# Patient Record
Sex: Female | Born: 1960
Health system: Southern US, Community
[De-identification: ages and names within clinical notes are randomized; demographics above are authoritative.]

## PROBLEM LIST (undated history)

## (undated) DIAGNOSIS — T7840XA Allergy, unspecified, initial encounter: Secondary | ICD-10-CM

## (undated) DIAGNOSIS — Z91199 Patient's noncompliance with other medical treatment and regimen due to unspecified reason: Secondary | ICD-10-CM

## (undated) DIAGNOSIS — G4730 Sleep apnea, unspecified: Secondary | ICD-10-CM

## (undated) DIAGNOSIS — E119 Type 2 diabetes mellitus without complications: Secondary | ICD-10-CM

## (undated) DIAGNOSIS — N83209 Unspecified ovarian cyst, unspecified side: Secondary | ICD-10-CM

## (undated) DIAGNOSIS — E079 Disorder of thyroid, unspecified: Secondary | ICD-10-CM

## (undated) DIAGNOSIS — K222 Esophageal obstruction: Secondary | ICD-10-CM

## (undated) DIAGNOSIS — R0789 Other chest pain: Secondary | ICD-10-CM

## (undated) DIAGNOSIS — K760 Fatty (change of) liver, not elsewhere classified: Secondary | ICD-10-CM

## (undated) DIAGNOSIS — R011 Cardiac murmur, unspecified: Secondary | ICD-10-CM

## (undated) DIAGNOSIS — E785 Hyperlipidemia, unspecified: Secondary | ICD-10-CM

## (undated) DIAGNOSIS — K279 Peptic ulcer, site unspecified, unspecified as acute or chronic, without hemorrhage or perforation: Secondary | ICD-10-CM

## (undated) DIAGNOSIS — R748 Abnormal levels of other serum enzymes: Secondary | ICD-10-CM

## (undated) DIAGNOSIS — I10 Essential (primary) hypertension: Secondary | ICD-10-CM

## (undated) DIAGNOSIS — D1803 Hemangioma of intra-abdominal structures: Secondary | ICD-10-CM

## (undated) DIAGNOSIS — K219 Gastro-esophageal reflux disease without esophagitis: Secondary | ICD-10-CM

## (undated) DIAGNOSIS — M1990 Unspecified osteoarthritis, unspecified site: Secondary | ICD-10-CM

## (undated) DIAGNOSIS — L919 Hypertrophic disorder of the skin, unspecified: Secondary | ICD-10-CM

## (undated) DIAGNOSIS — K3184 Gastroparesis: Secondary | ICD-10-CM

## (undated) DIAGNOSIS — D509 Iron deficiency anemia, unspecified: Secondary | ICD-10-CM

## (undated) DIAGNOSIS — Z9119 Patient's noncompliance with other medical treatment and regimen: Secondary | ICD-10-CM

## (undated) DIAGNOSIS — G43909 Migraine, unspecified, not intractable, without status migrainosus: Secondary | ICD-10-CM

## (undated) HISTORY — DX: Iron deficiency anemia, unspecified: D50.9

## (undated) HISTORY — DX: Esophageal obstruction: K22.2

## (undated) HISTORY — DX: Unspecified ovarian cyst, unspecified side: N83.209

## (undated) HISTORY — DX: Sleep apnea, unspecified: G47.30

## (undated) HISTORY — PX: COLONOSCOPY: SHX174

## (undated) HISTORY — DX: Type 2 diabetes mellitus without complications: E11.9

## (undated) HISTORY — DX: Gastro-esophageal reflux disease without esophagitis: K21.9

## (undated) HISTORY — DX: Patient's noncompliance with other medical treatment and regimen due to unspecified reason: Z91.199

## (undated) HISTORY — PX: TOTAL ABDOMINAL HYSTERECTOMY: SHX209

## (undated) HISTORY — DX: Allergy, unspecified, initial encounter: T78.40XA

## (undated) HISTORY — DX: Hyperlipidemia, unspecified: E78.5

## (undated) HISTORY — DX: Other chest pain: R07.89

## (undated) HISTORY — DX: Disorder of thyroid, unspecified: E07.9

## (undated) HISTORY — PX: UPPER GASTROINTESTINAL ENDOSCOPY: SHX188

## (undated) HISTORY — DX: Cardiac murmur, unspecified: R01.1

## (undated) HISTORY — DX: Migraine, unspecified, not intractable, without status migrainosus: G43.909

## (undated) HISTORY — PX: BREAST BIOPSY: SHX20

## (undated) HISTORY — DX: Hemangioma of intra-abdominal structures: D18.03

## (undated) HISTORY — DX: Patient's noncompliance with other medical treatment and regimen: Z91.19

## (undated) HISTORY — PX: CHOLECYSTECTOMY: SHX55

## (undated) HISTORY — DX: Peptic ulcer, site unspecified, unspecified as acute or chronic, without hemorrhage or perforation: K27.9

## (undated) HISTORY — DX: Hypertrophic disorder of the skin, unspecified: L91.9

## (undated) HISTORY — DX: Unspecified osteoarthritis, unspecified site: M19.90

## (undated) HISTORY — PX: POLYPECTOMY: SHX149

## (undated) HISTORY — DX: Abnormal levels of other serum enzymes: R74.8

## (undated) HISTORY — DX: Essential (primary) hypertension: I10

## (undated) HISTORY — DX: Gastroparesis: K31.84

## (undated) HISTORY — DX: Fatty (change of) liver, not elsewhere classified: K76.0

## (undated) HISTORY — PX: ABDOMINAL EXPLORATION SURGERY: SHX538

---

## 1998-03-14 ENCOUNTER — Other Ambulatory Visit: Admission: RE | Admit: 1998-03-14 | Discharge: 1998-03-14 | Payer: Self-pay | Admitting: Obstetrics and Gynecology

## 1998-05-30 ENCOUNTER — Emergency Department (HOSPITAL_COMMUNITY): Admission: EM | Admit: 1998-05-30 | Discharge: 1998-05-30 | Payer: Self-pay | Admitting: Emergency Medicine

## 1998-08-03 ENCOUNTER — Emergency Department (HOSPITAL_COMMUNITY): Admission: EM | Admit: 1998-08-03 | Discharge: 1998-08-03 | Payer: Self-pay | Admitting: Emergency Medicine

## 1998-08-11 ENCOUNTER — Encounter: Payer: Self-pay | Admitting: Internal Medicine

## 1998-08-11 ENCOUNTER — Emergency Department (HOSPITAL_COMMUNITY): Admission: EM | Admit: 1998-08-11 | Discharge: 1998-08-11 | Payer: Self-pay | Admitting: Internal Medicine

## 1998-08-24 ENCOUNTER — Emergency Department (HOSPITAL_COMMUNITY): Admission: EM | Admit: 1998-08-24 | Discharge: 1998-08-24 | Payer: Self-pay | Admitting: Emergency Medicine

## 1998-10-26 ENCOUNTER — Emergency Department (HOSPITAL_COMMUNITY): Admission: EM | Admit: 1998-10-26 | Discharge: 1998-10-26 | Payer: Self-pay

## 1998-12-23 ENCOUNTER — Emergency Department (HOSPITAL_COMMUNITY): Admission: EM | Admit: 1998-12-23 | Discharge: 1998-12-23 | Payer: Self-pay | Admitting: Emergency Medicine

## 1998-12-30 ENCOUNTER — Ambulatory Visit (HOSPITAL_COMMUNITY): Admission: RE | Admit: 1998-12-30 | Discharge: 1998-12-30 | Payer: Self-pay | Admitting: Emergency Medicine

## 1998-12-30 ENCOUNTER — Encounter: Payer: Self-pay | Admitting: Emergency Medicine

## 1999-03-08 ENCOUNTER — Emergency Department (HOSPITAL_COMMUNITY): Admission: EM | Admit: 1999-03-08 | Discharge: 1999-03-08 | Payer: Self-pay | Admitting: Emergency Medicine

## 1999-03-10 ENCOUNTER — Encounter (HOSPITAL_COMMUNITY): Admission: RE | Admit: 1999-03-10 | Discharge: 1999-06-08 | Payer: Self-pay | Admitting: Neurology

## 1999-04-26 ENCOUNTER — Emergency Department (HOSPITAL_COMMUNITY): Admission: EM | Admit: 1999-04-26 | Discharge: 1999-04-26 | Payer: Self-pay | Admitting: Internal Medicine

## 1999-04-26 ENCOUNTER — Encounter: Payer: Self-pay | Admitting: Internal Medicine

## 1999-05-15 ENCOUNTER — Encounter: Admission: RE | Admit: 1999-05-15 | Discharge: 1999-08-13 | Payer: Self-pay | Admitting: *Deleted

## 1999-06-02 ENCOUNTER — Ambulatory Visit (HOSPITAL_COMMUNITY): Admission: RE | Admit: 1999-06-02 | Discharge: 1999-06-02 | Payer: Self-pay | Admitting: Obstetrics and Gynecology

## 1999-07-05 ENCOUNTER — Emergency Department (HOSPITAL_COMMUNITY): Admission: EM | Admit: 1999-07-05 | Discharge: 1999-07-05 | Payer: Self-pay | Admitting: *Deleted

## 1999-07-06 ENCOUNTER — Emergency Department (HOSPITAL_COMMUNITY): Admission: EM | Admit: 1999-07-06 | Discharge: 1999-07-06 | Payer: Self-pay | Admitting: Emergency Medicine

## 1999-07-11 ENCOUNTER — Encounter: Payer: Self-pay | Admitting: *Deleted

## 1999-07-11 ENCOUNTER — Encounter: Admission: RE | Admit: 1999-07-11 | Discharge: 1999-07-11 | Payer: Self-pay | Admitting: *Deleted

## 1999-07-13 ENCOUNTER — Ambulatory Visit (HOSPITAL_COMMUNITY): Admission: RE | Admit: 1999-07-13 | Discharge: 1999-07-13 | Payer: Self-pay | Admitting: Obstetrics and Gynecology

## 1999-09-27 ENCOUNTER — Emergency Department (HOSPITAL_COMMUNITY): Admission: EM | Admit: 1999-09-27 | Discharge: 1999-09-27 | Payer: Self-pay | Admitting: Internal Medicine

## 1999-09-27 ENCOUNTER — Encounter: Payer: Self-pay | Admitting: Emergency Medicine

## 1999-11-08 ENCOUNTER — Emergency Department (HOSPITAL_COMMUNITY): Admission: EM | Admit: 1999-11-08 | Discharge: 1999-11-08 | Payer: Self-pay | Admitting: Emergency Medicine

## 1999-11-14 ENCOUNTER — Emergency Department (HOSPITAL_COMMUNITY): Admission: EM | Admit: 1999-11-14 | Discharge: 1999-11-14 | Payer: Self-pay | Admitting: *Deleted

## 2000-01-09 ENCOUNTER — Encounter: Payer: Self-pay | Admitting: Emergency Medicine

## 2000-01-09 ENCOUNTER — Emergency Department (HOSPITAL_COMMUNITY): Admission: EM | Admit: 2000-01-09 | Discharge: 2000-01-09 | Payer: Self-pay | Admitting: Emergency Medicine

## 2000-02-21 ENCOUNTER — Emergency Department (HOSPITAL_COMMUNITY): Admission: EM | Admit: 2000-02-21 | Discharge: 2000-02-21 | Payer: Self-pay | Admitting: Emergency Medicine

## 2000-02-29 ENCOUNTER — Emergency Department (HOSPITAL_COMMUNITY): Admission: EM | Admit: 2000-02-29 | Discharge: 2000-02-29 | Payer: Self-pay | Admitting: *Deleted

## 2000-03-05 ENCOUNTER — Other Ambulatory Visit: Admission: RE | Admit: 2000-03-05 | Discharge: 2000-03-05 | Payer: Self-pay | Admitting: Orthopedic Surgery

## 2000-04-10 ENCOUNTER — Emergency Department (HOSPITAL_COMMUNITY): Admission: EM | Admit: 2000-04-10 | Discharge: 2000-04-10 | Payer: Self-pay | Admitting: Emergency Medicine

## 2000-04-18 ENCOUNTER — Emergency Department (HOSPITAL_COMMUNITY): Admission: EM | Admit: 2000-04-18 | Discharge: 2000-04-19 | Payer: Self-pay

## 2000-04-19 ENCOUNTER — Encounter: Payer: Self-pay | Admitting: Emergency Medicine

## 2000-07-16 ENCOUNTER — Other Ambulatory Visit: Admission: RE | Admit: 2000-07-16 | Discharge: 2000-07-16 | Payer: Self-pay | Admitting: Orthopedic Surgery

## 2000-09-12 ENCOUNTER — Emergency Department (HOSPITAL_COMMUNITY): Admission: EM | Admit: 2000-09-12 | Discharge: 2000-09-12 | Payer: Self-pay | Admitting: Emergency Medicine

## 2000-12-12 ENCOUNTER — Encounter: Payer: Self-pay | Admitting: Emergency Medicine

## 2000-12-12 ENCOUNTER — Emergency Department (HOSPITAL_COMMUNITY): Admission: EM | Admit: 2000-12-12 | Discharge: 2000-12-12 | Payer: Self-pay | Admitting: Emergency Medicine

## 2001-01-09 ENCOUNTER — Other Ambulatory Visit: Admission: RE | Admit: 2001-01-09 | Discharge: 2001-01-09 | Payer: Self-pay | Admitting: Orthopedic Surgery

## 2001-03-19 ENCOUNTER — Emergency Department (HOSPITAL_COMMUNITY): Admission: EM | Admit: 2001-03-19 | Discharge: 2001-03-20 | Payer: Self-pay | Admitting: Emergency Medicine

## 2001-03-20 ENCOUNTER — Encounter: Payer: Self-pay | Admitting: Emergency Medicine

## 2001-04-22 ENCOUNTER — Emergency Department (HOSPITAL_COMMUNITY): Admission: EM | Admit: 2001-04-22 | Discharge: 2001-04-22 | Payer: Self-pay | Admitting: *Deleted

## 2001-05-02 ENCOUNTER — Emergency Department (HOSPITAL_COMMUNITY): Admission: EM | Admit: 2001-05-02 | Discharge: 2001-05-02 | Payer: Self-pay | Admitting: Emergency Medicine

## 2001-05-13 ENCOUNTER — Emergency Department (HOSPITAL_COMMUNITY): Admission: EM | Admit: 2001-05-13 | Discharge: 2001-05-13 | Payer: Self-pay | Admitting: Emergency Medicine

## 2001-06-30 ENCOUNTER — Emergency Department (HOSPITAL_COMMUNITY): Admission: EM | Admit: 2001-06-30 | Discharge: 2001-06-30 | Payer: Self-pay | Admitting: Emergency Medicine

## 2001-08-31 ENCOUNTER — Emergency Department (HOSPITAL_COMMUNITY): Admission: EM | Admit: 2001-08-31 | Discharge: 2001-09-01 | Payer: Self-pay | Admitting: Emergency Medicine

## 2001-09-05 ENCOUNTER — Emergency Department (HOSPITAL_COMMUNITY): Admission: EM | Admit: 2001-09-05 | Discharge: 2001-09-05 | Payer: Self-pay | Admitting: Emergency Medicine

## 2001-11-14 ENCOUNTER — Encounter: Payer: Self-pay | Admitting: Obstetrics and Gynecology

## 2001-11-14 ENCOUNTER — Inpatient Hospital Stay (HOSPITAL_COMMUNITY): Admission: AD | Admit: 2001-11-14 | Discharge: 2001-11-14 | Payer: Self-pay | Admitting: Obstetrics and Gynecology

## 2001-12-05 ENCOUNTER — Encounter (INDEPENDENT_AMBULATORY_CARE_PROVIDER_SITE_OTHER): Payer: Self-pay | Admitting: *Deleted

## 2001-12-05 ENCOUNTER — Inpatient Hospital Stay (HOSPITAL_COMMUNITY): Admission: RE | Admit: 2001-12-05 | Discharge: 2001-12-07 | Payer: Self-pay | Admitting: Obstetrics and Gynecology

## 2002-03-02 ENCOUNTER — Encounter: Payer: Self-pay | Admitting: Emergency Medicine

## 2002-03-02 ENCOUNTER — Emergency Department (HOSPITAL_COMMUNITY): Admission: EM | Admit: 2002-03-02 | Discharge: 2002-03-02 | Payer: Self-pay | Admitting: Emergency Medicine

## 2002-04-02 ENCOUNTER — Encounter: Payer: Self-pay | Admitting: Obstetrics and Gynecology

## 2002-04-02 ENCOUNTER — Encounter: Admission: RE | Admit: 2002-04-02 | Discharge: 2002-04-02 | Payer: Self-pay | Admitting: Obstetrics and Gynecology

## 2002-05-12 ENCOUNTER — Encounter (HOSPITAL_BASED_OUTPATIENT_CLINIC_OR_DEPARTMENT_OTHER): Payer: Self-pay | Admitting: General Surgery

## 2002-05-12 ENCOUNTER — Ambulatory Visit (HOSPITAL_COMMUNITY): Admission: RE | Admit: 2002-05-12 | Discharge: 2002-05-12 | Payer: Self-pay | Admitting: General Surgery

## 2002-05-18 ENCOUNTER — Encounter (HOSPITAL_BASED_OUTPATIENT_CLINIC_OR_DEPARTMENT_OTHER): Payer: Self-pay | Admitting: General Surgery

## 2002-05-18 ENCOUNTER — Ambulatory Visit (HOSPITAL_COMMUNITY): Admission: RE | Admit: 2002-05-18 | Discharge: 2002-05-18 | Payer: Self-pay | Admitting: General Surgery

## 2002-05-25 ENCOUNTER — Encounter (HOSPITAL_BASED_OUTPATIENT_CLINIC_OR_DEPARTMENT_OTHER): Payer: Self-pay | Admitting: General Surgery

## 2002-05-25 ENCOUNTER — Ambulatory Visit (HOSPITAL_COMMUNITY): Admission: RE | Admit: 2002-05-25 | Discharge: 2002-05-25 | Payer: Self-pay | Admitting: General Surgery

## 2002-07-06 ENCOUNTER — Ambulatory Visit (HOSPITAL_COMMUNITY): Admission: RE | Admit: 2002-07-06 | Discharge: 2002-07-06 | Payer: Self-pay | Admitting: General Surgery

## 2002-07-06 ENCOUNTER — Encounter (HOSPITAL_BASED_OUTPATIENT_CLINIC_OR_DEPARTMENT_OTHER): Payer: Self-pay | Admitting: General Surgery

## 2002-11-18 ENCOUNTER — Emergency Department (HOSPITAL_COMMUNITY): Admission: EM | Admit: 2002-11-18 | Discharge: 2002-11-18 | Payer: Self-pay | Admitting: *Deleted

## 2002-11-18 ENCOUNTER — Encounter: Payer: Self-pay | Admitting: *Deleted

## 2002-11-20 ENCOUNTER — Encounter: Payer: Self-pay | Admitting: Internal Medicine

## 2002-11-20 ENCOUNTER — Encounter: Payer: Self-pay | Admitting: Gastroenterology

## 2002-11-20 ENCOUNTER — Emergency Department (HOSPITAL_COMMUNITY): Admission: EM | Admit: 2002-11-20 | Discharge: 2002-11-20 | Payer: Self-pay | Admitting: Emergency Medicine

## 2002-11-20 ENCOUNTER — Ambulatory Visit (HOSPITAL_COMMUNITY): Admission: RE | Admit: 2002-11-20 | Discharge: 2002-11-20 | Payer: Self-pay | Admitting: Gastroenterology

## 2002-11-27 ENCOUNTER — Ambulatory Visit (HOSPITAL_COMMUNITY): Admission: RE | Admit: 2002-11-27 | Discharge: 2002-11-27 | Payer: Self-pay | Admitting: Gastroenterology

## 2002-11-27 ENCOUNTER — Encounter: Payer: Self-pay | Admitting: Gastroenterology

## 2003-02-04 ENCOUNTER — Encounter: Payer: Self-pay | Admitting: Gastroenterology

## 2003-02-04 ENCOUNTER — Ambulatory Visit (HOSPITAL_COMMUNITY): Admission: RE | Admit: 2003-02-04 | Discharge: 2003-02-04 | Payer: Self-pay | Admitting: Gastroenterology

## 2003-03-11 ENCOUNTER — Ambulatory Visit (HOSPITAL_COMMUNITY): Admission: RE | Admit: 2003-03-11 | Discharge: 2003-03-11 | Payer: Self-pay | Admitting: Gastroenterology

## 2003-03-11 ENCOUNTER — Encounter: Payer: Self-pay | Admitting: Gastroenterology

## 2003-03-18 ENCOUNTER — Ambulatory Visit (HOSPITAL_COMMUNITY): Admission: RE | Admit: 2003-03-18 | Discharge: 2003-03-18 | Payer: Self-pay | Admitting: Gastroenterology

## 2003-03-18 ENCOUNTER — Encounter: Payer: Self-pay | Admitting: Gastroenterology

## 2003-03-22 ENCOUNTER — Encounter: Payer: Self-pay | Admitting: Gastroenterology

## 2003-03-22 ENCOUNTER — Ambulatory Visit (HOSPITAL_COMMUNITY): Admission: RE | Admit: 2003-03-22 | Discharge: 2003-03-22 | Payer: Self-pay | Admitting: Gastroenterology

## 2003-04-06 ENCOUNTER — Encounter: Payer: Self-pay | Admitting: Gastroenterology

## 2003-04-06 ENCOUNTER — Ambulatory Visit (HOSPITAL_COMMUNITY): Admission: RE | Admit: 2003-04-06 | Discharge: 2003-04-06 | Payer: Self-pay | Admitting: Gastroenterology

## 2003-06-26 HISTORY — PX: KNEE SURGERY: SHX244

## 2003-08-04 ENCOUNTER — Emergency Department (HOSPITAL_COMMUNITY): Admission: EM | Admit: 2003-08-04 | Discharge: 2003-08-04 | Payer: Self-pay | Admitting: Emergency Medicine

## 2003-10-08 ENCOUNTER — Emergency Department (HOSPITAL_COMMUNITY): Admission: EM | Admit: 2003-10-08 | Discharge: 2003-10-08 | Payer: Self-pay | Admitting: Emergency Medicine

## 2003-11-01 ENCOUNTER — Encounter: Admission: RE | Admit: 2003-11-01 | Discharge: 2003-11-01 | Payer: Self-pay | Admitting: *Deleted

## 2004-01-01 ENCOUNTER — Emergency Department (HOSPITAL_COMMUNITY): Admission: EM | Admit: 2004-01-01 | Discharge: 2004-01-01 | Payer: Self-pay | Admitting: Emergency Medicine

## 2004-02-02 ENCOUNTER — Emergency Department (HOSPITAL_COMMUNITY): Admission: EM | Admit: 2004-02-02 | Discharge: 2004-02-02 | Payer: Self-pay | Admitting: Emergency Medicine

## 2004-03-13 ENCOUNTER — Emergency Department (HOSPITAL_COMMUNITY): Admission: EM | Admit: 2004-03-13 | Discharge: 2004-03-14 | Payer: Self-pay | Admitting: Emergency Medicine

## 2004-03-16 ENCOUNTER — Emergency Department (HOSPITAL_COMMUNITY): Admission: EM | Admit: 2004-03-16 | Discharge: 2004-03-16 | Payer: Self-pay | Admitting: Emergency Medicine

## 2004-04-10 ENCOUNTER — Emergency Department (HOSPITAL_COMMUNITY): Admission: EM | Admit: 2004-04-10 | Discharge: 2004-04-10 | Payer: Self-pay | Admitting: Emergency Medicine

## 2004-04-10 ENCOUNTER — Ambulatory Visit (HOSPITAL_COMMUNITY): Admission: RE | Admit: 2004-04-10 | Discharge: 2004-04-10 | Payer: Self-pay | Admitting: Urology

## 2004-05-03 ENCOUNTER — Ambulatory Visit: Payer: Self-pay | Admitting: Internal Medicine

## 2004-06-21 ENCOUNTER — Ambulatory Visit (HOSPITAL_BASED_OUTPATIENT_CLINIC_OR_DEPARTMENT_OTHER): Admission: RE | Admit: 2004-06-21 | Discharge: 2004-06-21 | Payer: Self-pay | Admitting: Orthopedic Surgery

## 2004-06-21 ENCOUNTER — Ambulatory Visit (HOSPITAL_COMMUNITY): Admission: RE | Admit: 2004-06-21 | Discharge: 2004-06-21 | Payer: Self-pay | Admitting: Orthopedic Surgery

## 2004-08-01 ENCOUNTER — Emergency Department (HOSPITAL_COMMUNITY): Admission: EM | Admit: 2004-08-01 | Discharge: 2004-08-01 | Payer: Self-pay | Admitting: *Deleted

## 2004-09-12 ENCOUNTER — Ambulatory Visit: Payer: Self-pay | Admitting: Gastroenterology

## 2004-09-13 ENCOUNTER — Ambulatory Visit: Payer: Self-pay | Admitting: Gastroenterology

## 2004-11-10 ENCOUNTER — Ambulatory Visit: Payer: Self-pay | Admitting: Gastroenterology

## 2005-02-16 ENCOUNTER — Emergency Department (HOSPITAL_COMMUNITY): Admission: EM | Admit: 2005-02-16 | Discharge: 2005-02-16 | Payer: Self-pay | Admitting: Emergency Medicine

## 2005-06-13 ENCOUNTER — Ambulatory Visit: Payer: Self-pay | Admitting: Gastroenterology

## 2005-06-26 ENCOUNTER — Ambulatory Visit: Payer: Self-pay | Admitting: Gastroenterology

## 2005-07-03 ENCOUNTER — Ambulatory Visit (HOSPITAL_COMMUNITY): Admission: RE | Admit: 2005-07-03 | Discharge: 2005-07-03 | Payer: Self-pay | Admitting: Gastroenterology

## 2005-07-09 ENCOUNTER — Inpatient Hospital Stay (HOSPITAL_COMMUNITY): Admission: EM | Admit: 2005-07-09 | Discharge: 2005-07-15 | Payer: Self-pay | Admitting: Emergency Medicine

## 2005-07-10 ENCOUNTER — Encounter: Payer: Self-pay | Admitting: *Deleted

## 2005-07-12 ENCOUNTER — Ambulatory Visit: Payer: Self-pay | Admitting: Gastroenterology

## 2005-07-23 ENCOUNTER — Ambulatory Visit: Payer: Self-pay | Admitting: Gastroenterology

## 2005-10-03 ENCOUNTER — Ambulatory Visit: Payer: Self-pay | Admitting: Gastroenterology

## 2005-10-18 ENCOUNTER — Ambulatory Visit (HOSPITAL_COMMUNITY): Admission: RE | Admit: 2005-10-18 | Discharge: 2005-10-18 | Payer: Self-pay | Admitting: Gastroenterology

## 2005-10-24 ENCOUNTER — Ambulatory Visit: Payer: Self-pay | Admitting: Internal Medicine

## 2005-11-20 ENCOUNTER — Emergency Department (HOSPITAL_COMMUNITY): Admission: EM | Admit: 2005-11-20 | Discharge: 2005-11-20 | Payer: Self-pay | Admitting: *Deleted

## 2006-01-28 ENCOUNTER — Emergency Department (HOSPITAL_COMMUNITY): Admission: EM | Admit: 2006-01-28 | Discharge: 2006-01-28 | Payer: Self-pay | Admitting: Emergency Medicine

## 2006-02-19 ENCOUNTER — Ambulatory Visit: Payer: Self-pay | Admitting: Internal Medicine

## 2006-02-19 LAB — CONVERTED CEMR LAB: TSH: 0.34 microintl units/mL

## 2006-03-22 ENCOUNTER — Ambulatory Visit: Payer: Self-pay | Admitting: Internal Medicine

## 2006-04-19 ENCOUNTER — Ambulatory Visit: Payer: Self-pay | Admitting: Internal Medicine

## 2006-04-30 ENCOUNTER — Ambulatory Visit: Payer: Self-pay | Admitting: Internal Medicine

## 2006-04-30 LAB — CONVERTED CEMR LAB
ALT: 21 units/L (ref 0–40)
ALT: 23 units/L (ref 0–40)
AST: 19 units/L (ref 0–37)
AST: 18 units/L (ref 0–37)
Albumin: 3.3 g/dL — ABNORMAL LOW (ref 3.5–5.2)
Alkaline Phosphatase: 134 units/L — ABNORMAL HIGH (ref 39–117)
Amylase: 69 units/L (ref 27–131)
BUN: 8 mg/dL (ref 6–23)
BUN: 10 mg/dL (ref 6–23)
Bilirubin Urine: NEGATIVE
Bilirubin, Direct: 0.1 mg/dL (ref 0.0–0.3)
CO2: 28 meq/L (ref 19–32)
CO2: 30 meq/L (ref 19–32)
Calcium: 9 mg/dL (ref 8.4–10.5)
Calcium: 9 mg/dL (ref 8.4–10.5)
Chloride: 106 meq/L (ref 96–112)
Chloride: 104 meq/L (ref 96–112)
Chol/HDL Ratio, serum: 6.8
Cholesterol: 280 mg/dL (ref 0–200)
Creatinine, Ser: 0.8 mg/dL (ref 0.4–1.2)
Creatinine, Ser: 0.8 mg/dL (ref 0.4–1.2)
Creatinine,U: 179.8 mg/dL
Crystals: NEGATIVE
GFR calc non Af Amer: 82 mL/min
GFR calc non Af Amer: 82 mL/min
Glomerular Filtration Rate, Af Am: 99 mL/min/{1.73_m2}
Glomerular Filtration Rate, Af Am: 99 mL/min/{1.73_m2}
Glucose, Bld: 102 mg/dL — ABNORMAL HIGH (ref 70–99)
Glucose, Bld: 98 mg/dL (ref 70–99)
HDL: 41.3 mg/dL (ref >39.0)
Hemoglobin, Urine: NEGATIVE
Hgb A1c MFr Bld: 6.3 % — ABNORMAL HIGH (ref 4.6–6.0)
Hgb A1c MFr Bld: 6.7 %
Ketones, ur: NEGATIVE mg/dL
LDL DIRECT: 212.6 mg/dL
Leukocytes, UA: NEGATIVE
Lipase: 29 units/L (ref 11.0–59.0)
Microalb Creat Ratio: 2.2 mg/g (ref 0.0–30.0)
Microalb, Ur: 0.4 mg/dL (ref 0.0–1.9)
Mucus, UA: NEGATIVE
Nitrite: NEGATIVE
Potassium: 4 meq/L (ref 3.5–5.1)
Potassium: 3.5 meq/L (ref 3.5–5.1)
RBC / HPF: NONE SEEN
Sodium: 140 meq/L (ref 135–145)
Sodium: 140 meq/L (ref 135–145)
Specific Gravity, Urine: 1.02 (ref 1.000–1.03)
Total Bilirubin: 0.5 mg/dL (ref 0.3–1.2)
Total Protein, Urine: NEGATIVE mg/dL
Total Protein: 7.1 g/dL (ref 6.0–8.3)
Triglyceride fasting, serum: 103 mg/dL (ref 0–149)
Urine Glucose: NEGATIVE mg/dL
Urobilinogen, UA: 0.2 (ref 0.0–1.0)
VLDL: 21 mg/dL (ref 0–40)
pH: 7.5 (ref 5.0–8.0)

## 2006-05-01 ENCOUNTER — Ambulatory Visit (HOSPITAL_COMMUNITY): Admission: RE | Admit: 2006-05-01 | Discharge: 2006-05-01 | Payer: Self-pay | Admitting: Internal Medicine

## 2006-05-22 ENCOUNTER — Ambulatory Visit: Payer: Self-pay | Admitting: Internal Medicine

## 2006-07-28 ENCOUNTER — Emergency Department (HOSPITAL_COMMUNITY): Admission: EM | Admit: 2006-07-28 | Discharge: 2006-07-28 | Payer: Self-pay | Admitting: Emergency Medicine

## 2006-08-06 ENCOUNTER — Ambulatory Visit: Payer: Self-pay | Admitting: Internal Medicine

## 2006-08-19 ENCOUNTER — Ambulatory Visit: Payer: Self-pay | Admitting: Gastroenterology

## 2006-09-12 ENCOUNTER — Encounter: Payer: Self-pay | Admitting: Gastroenterology

## 2006-09-12 ENCOUNTER — Ambulatory Visit (HOSPITAL_COMMUNITY): Admission: RE | Admit: 2006-09-12 | Discharge: 2006-09-12 | Payer: Self-pay | Admitting: Gastroenterology

## 2006-09-17 ENCOUNTER — Ambulatory Visit: Payer: Self-pay | Admitting: Gastroenterology

## 2006-10-04 ENCOUNTER — Ambulatory Visit: Payer: Self-pay | Admitting: Internal Medicine

## 2006-10-16 ENCOUNTER — Ambulatory Visit: Payer: Self-pay | Admitting: Internal Medicine

## 2006-10-16 LAB — CONVERTED CEMR LAB
BUN: 24 mg/dL — ABNORMAL HIGH (ref 6–23)
CO2: 27 meq/L (ref 19–32)
Calcium: 9 mg/dL (ref 8.4–10.5)
Chloride: 105 meq/L (ref 96–112)
Creatinine, Ser: 0.9 mg/dL (ref 0.4–1.2)
GFR calc Af Amer: 86 mL/min
GFR calc non Af Amer: 71 mL/min
Glucose, Bld: 110 mg/dL — ABNORMAL HIGH (ref 70–99)
Potassium: 3.8 meq/L (ref 3.5–5.1)
Sodium: 139 meq/L (ref 135–145)

## 2006-11-13 ENCOUNTER — Ambulatory Visit: Payer: Self-pay | Admitting: Internal Medicine

## 2006-11-13 LAB — CONVERTED CEMR LAB
BUN: 12 mg/dL (ref 6–23)
CO2: 29 meq/L (ref 19–32)
Calcium: 9 mg/dL (ref 8.4–10.5)
Chloride: 103 meq/L (ref 96–112)
Creatinine, Ser: 0.8 mg/dL (ref 0.4–1.2)
GFR calc Af Amer: 98 mL/min
GFR calc non Af Amer: 81 mL/min
Glucose, Bld: 106 mg/dL — ABNORMAL HIGH (ref 70–99)
Hgb A1c MFr Bld: 6.7 %
Hgb A1c MFr Bld: 6.7 % — ABNORMAL HIGH (ref 4.6–6.0)
Potassium: 3.8 meq/L (ref 3.5–5.1)
Sodium: 137 meq/L (ref 135–145)

## 2006-11-21 ENCOUNTER — Ambulatory Visit: Payer: Self-pay | Admitting: Internal Medicine

## 2006-11-21 LAB — CONVERTED CEMR LAB
Bilirubin Urine: NEGATIVE
Crystals: NEGATIVE
Hemoglobin, Urine: NEGATIVE
Ketones, ur: NEGATIVE mg/dL
Leukocytes, UA: NEGATIVE
Mucus, UA: NEGATIVE
Nitrite: NEGATIVE
Specific Gravity, Urine: 1.015 (ref 1.000–1.03)
Total Protein, Urine: NEGATIVE mg/dL
Urine Glucose: NEGATIVE mg/dL
Urobilinogen, UA: 0.2 (ref 0.0–1.0)
pH: 7 (ref 5.0–8.0)

## 2007-01-02 ENCOUNTER — Ambulatory Visit: Payer: Self-pay | Admitting: Internal Medicine

## 2007-03-01 ENCOUNTER — Encounter: Payer: Self-pay | Admitting: Internal Medicine

## 2007-03-06 ENCOUNTER — Ambulatory Visit: Payer: Self-pay | Admitting: Internal Medicine

## 2007-04-10 ENCOUNTER — Ambulatory Visit: Payer: Self-pay | Admitting: Internal Medicine

## 2007-04-10 LAB — CONVERTED CEMR LAB
ALT: 16 units/L (ref 0–35)
AST: 17 units/L (ref 0–37)
BUN: 8 mg/dL (ref 6–23)
CO2: 28 meq/L (ref 19–32)
Calcium: 8.8 mg/dL (ref 8.4–10.5)
Chloride: 108 meq/L (ref 96–112)
Cholesterol: 265 mg/dL (ref 0–200)
Creatinine, Ser: 0.8 mg/dL (ref 0.4–1.2)
Creatinine,U: 292 mg/dL
Direct LDL: 208 mg/dL
GFR calc Af Amer: 98 mL/min
GFR calc non Af Amer: 81 mL/min
Glucose, Bld: 114 mg/dL — ABNORMAL HIGH (ref 70–99)
HDL: 47.5 mg/dL (ref >39.0)
Hgb A1c MFr Bld: 6.5 % — ABNORMAL HIGH (ref 4.6–6.0)
Microalb Creat Ratio: 4.1 mg/g (ref 0.0–30.0)
Microalb, Ur: 1.2 mg/dL (ref 0.0–1.9)
Potassium: 3.6 meq/L (ref 3.5–5.1)
Sodium: 141 meq/L (ref 135–145)
Total CHOL/HDL Ratio: 5.6
Triglycerides: 65 mg/dL (ref 0–149)
VLDL: 13 mg/dL (ref 0–40)

## 2007-04-17 ENCOUNTER — Ambulatory Visit: Payer: Self-pay | Admitting: Internal Medicine

## 2007-04-29 ENCOUNTER — Ambulatory Visit: Payer: Self-pay

## 2007-04-29 ENCOUNTER — Encounter: Payer: Self-pay | Admitting: Internal Medicine

## 2007-05-04 ENCOUNTER — Emergency Department (HOSPITAL_COMMUNITY): Admission: EM | Admit: 2007-05-04 | Discharge: 2007-05-05 | Payer: Self-pay | Admitting: Emergency Medicine

## 2007-05-06 ENCOUNTER — Telehealth: Payer: Self-pay | Admitting: Internal Medicine

## 2007-05-06 ENCOUNTER — Ambulatory Visit: Payer: Self-pay | Admitting: Internal Medicine

## 2007-05-06 ENCOUNTER — Ambulatory Visit: Payer: Self-pay | Admitting: Cardiovascular Disease

## 2007-05-06 DIAGNOSIS — E1159 Type 2 diabetes mellitus with other circulatory complications: Secondary | ICD-10-CM | POA: Insufficient documentation

## 2007-05-06 DIAGNOSIS — R0789 Other chest pain: Secondary | ICD-10-CM | POA: Insufficient documentation

## 2007-05-06 DIAGNOSIS — E782 Mixed hyperlipidemia: Secondary | ICD-10-CM | POA: Insufficient documentation

## 2007-05-06 DIAGNOSIS — I10 Essential (primary) hypertension: Secondary | ICD-10-CM | POA: Insufficient documentation

## 2007-05-07 ENCOUNTER — Telehealth (INDEPENDENT_AMBULATORY_CARE_PROVIDER_SITE_OTHER): Payer: Self-pay | Admitting: *Deleted

## 2007-05-08 ENCOUNTER — Ambulatory Visit: Payer: Self-pay | Admitting: Internal Medicine

## 2007-05-12 ENCOUNTER — Ambulatory Visit: Payer: Self-pay | Admitting: Gastroenterology

## 2007-05-16 ENCOUNTER — Ambulatory Visit (HOSPITAL_COMMUNITY): Admission: RE | Admit: 2007-05-16 | Discharge: 2007-05-16 | Payer: Self-pay | Admitting: Gastroenterology

## 2007-06-12 ENCOUNTER — Encounter: Payer: Self-pay | Admitting: Internal Medicine

## 2007-06-26 HISTORY — PX: CARDIAC CATHETERIZATION: SHX172

## 2007-06-30 ENCOUNTER — Ambulatory Visit: Payer: Self-pay | Admitting: Gastroenterology

## 2007-07-03 ENCOUNTER — Encounter: Payer: Self-pay | Admitting: Internal Medicine

## 2007-07-28 ENCOUNTER — Encounter: Payer: Self-pay | Admitting: Internal Medicine

## 2007-08-04 ENCOUNTER — Encounter: Payer: Self-pay | Admitting: Internal Medicine

## 2007-08-08 ENCOUNTER — Encounter (INDEPENDENT_AMBULATORY_CARE_PROVIDER_SITE_OTHER): Payer: Self-pay | Admitting: Otolaryngology

## 2007-08-08 ENCOUNTER — Ambulatory Visit (HOSPITAL_COMMUNITY): Admission: RE | Admit: 2007-08-08 | Discharge: 2007-08-09 | Payer: Self-pay | Admitting: Otolaryngology

## 2007-08-08 ENCOUNTER — Encounter: Payer: Self-pay | Admitting: Internal Medicine

## 2007-08-11 DIAGNOSIS — K449 Diaphragmatic hernia without obstruction or gangrene: Secondary | ICD-10-CM | POA: Insufficient documentation

## 2007-08-11 DIAGNOSIS — K279 Peptic ulcer, site unspecified, unspecified as acute or chronic, without hemorrhage or perforation: Secondary | ICD-10-CM | POA: Insufficient documentation

## 2007-08-11 DIAGNOSIS — G43909 Migraine, unspecified, not intractable, without status migrainosus: Secondary | ICD-10-CM | POA: Insufficient documentation

## 2007-08-11 DIAGNOSIS — N83209 Unspecified ovarian cyst, unspecified side: Secondary | ICD-10-CM | POA: Insufficient documentation

## 2007-08-11 DIAGNOSIS — N809 Endometriosis, unspecified: Secondary | ICD-10-CM | POA: Insufficient documentation

## 2007-08-14 ENCOUNTER — Ambulatory Visit: Payer: Self-pay | Admitting: Gastroenterology

## 2007-08-18 ENCOUNTER — Encounter: Admission: RE | Admit: 2007-08-18 | Discharge: 2007-08-18 | Payer: Self-pay | Admitting: Internal Medicine

## 2007-09-23 ENCOUNTER — Ambulatory Visit: Payer: Self-pay | Admitting: Gastroenterology

## 2007-09-23 DIAGNOSIS — R1012 Left upper quadrant pain: Secondary | ICD-10-CM | POA: Insufficient documentation

## 2007-10-28 ENCOUNTER — Emergency Department (HOSPITAL_COMMUNITY): Admission: EM | Admit: 2007-10-28 | Discharge: 2007-10-28 | Payer: Self-pay | Admitting: Emergency Medicine

## 2007-10-28 ENCOUNTER — Telehealth: Payer: Self-pay | Admitting: Internal Medicine

## 2007-10-31 ENCOUNTER — Ambulatory Visit: Payer: Self-pay | Admitting: Internal Medicine

## 2007-10-31 DIAGNOSIS — R7303 Prediabetes: Secondary | ICD-10-CM | POA: Insufficient documentation

## 2007-11-10 ENCOUNTER — Ambulatory Visit: Payer: Self-pay | Admitting: Cardiology

## 2007-11-13 ENCOUNTER — Telehealth: Payer: Self-pay | Admitting: Internal Medicine

## 2007-12-01 ENCOUNTER — Telehealth: Payer: Self-pay | Admitting: Gastroenterology

## 2007-12-02 ENCOUNTER — Ambulatory Visit: Payer: Self-pay | Admitting: Internal Medicine

## 2007-12-02 DIAGNOSIS — R112 Nausea with vomiting, unspecified: Secondary | ICD-10-CM | POA: Insufficient documentation

## 2007-12-03 LAB — CONVERTED CEMR LAB
Basophils Absolute: 0.1 10*3/uL (ref 0.0–0.1)
Basophils Relative: 1 % (ref 0.0–1.0)
Eosinophils Absolute: 0.2 10*3/uL (ref 0.0–0.7)
Eosinophils Relative: 2 % (ref 0.0–5.0)
HCT: 34.3 % — ABNORMAL LOW (ref 36.0–46.0)
Hemoglobin: 11.1 g/dL — ABNORMAL LOW (ref 12.0–15.0)
Lymphocytes Relative: 26 % (ref 12.0–46.0)
MCHC: 32.4 g/dL (ref 30.0–36.0)
MCV: 68.4 fL — ABNORMAL LOW (ref 78.0–100.0)
Monocytes Absolute: 0.6 10*3/uL (ref 0.1–1.0)
Monocytes Relative: 6.6 % (ref 3.0–12.0)
Neutro Abs: 5.9 10*3/uL (ref 1.4–7.7)
Neutrophils Relative %: 64.4 % (ref 43.0–77.0)
Platelets: 284 10*3/uL (ref 150–400)
RBC: 5.01 M/uL (ref 3.87–5.11)
RDW: 15 % — ABNORMAL HIGH (ref 11.5–14.6)
WBC: 9.2 10*3/uL (ref 4.5–10.5)

## 2007-12-04 ENCOUNTER — Ambulatory Visit: Payer: Self-pay | Admitting: Internal Medicine

## 2007-12-05 LAB — CONVERTED CEMR LAB
Ferritin: 92.3 ng/mL (ref 10.0–291.0)
Folate: 11 ng/mL
Iron: 37 ug/dL — ABNORMAL LOW (ref 42–145)
Saturation Ratios: 10.9 % — ABNORMAL LOW (ref 20.0–50.0)
Transferrin: 243.4 mg/dL (ref >=212.0)
Vitamin B-12: 773 pg/mL (ref 211–911)

## 2007-12-08 ENCOUNTER — Ambulatory Visit: Payer: Self-pay | Admitting: Internal Medicine

## 2007-12-08 ENCOUNTER — Encounter: Payer: Self-pay | Admitting: Internal Medicine

## 2007-12-08 DIAGNOSIS — D649 Anemia, unspecified: Secondary | ICD-10-CM | POA: Insufficient documentation

## 2007-12-08 DIAGNOSIS — J309 Allergic rhinitis, unspecified: Secondary | ICD-10-CM | POA: Insufficient documentation

## 2007-12-08 DIAGNOSIS — R1084 Generalized abdominal pain: Secondary | ICD-10-CM | POA: Insufficient documentation

## 2007-12-29 ENCOUNTER — Telehealth: Payer: Self-pay | Admitting: Gastroenterology

## 2008-01-01 ENCOUNTER — Ambulatory Visit: Payer: Self-pay | Admitting: Gastroenterology

## 2008-01-01 DIAGNOSIS — D1803 Hemangioma of intra-abdominal structures: Secondary | ICD-10-CM | POA: Insufficient documentation

## 2008-01-01 DIAGNOSIS — K222 Esophageal obstruction: Secondary | ICD-10-CM | POA: Insufficient documentation

## 2008-01-01 DIAGNOSIS — K3184 Gastroparesis: Secondary | ICD-10-CM | POA: Insufficient documentation

## 2008-01-08 ENCOUNTER — Ambulatory Visit (HOSPITAL_COMMUNITY): Admission: RE | Admit: 2008-01-08 | Discharge: 2008-01-08 | Payer: Self-pay | Admitting: Gastroenterology

## 2008-01-08 ENCOUNTER — Encounter: Payer: Self-pay | Admitting: Gastroenterology

## 2008-01-09 ENCOUNTER — Encounter: Payer: Self-pay | Admitting: Gastroenterology

## 2008-01-13 ENCOUNTER — Ambulatory Visit: Payer: Self-pay | Admitting: Gastroenterology

## 2008-01-20 ENCOUNTER — Ambulatory Visit: Payer: Self-pay | Admitting: Internal Medicine

## 2008-01-23 ENCOUNTER — Encounter (INDEPENDENT_AMBULATORY_CARE_PROVIDER_SITE_OTHER): Payer: Self-pay | Admitting: *Deleted

## 2008-01-29 ENCOUNTER — Ambulatory Visit: Payer: Self-pay | Admitting: Cardiovascular Disease

## 2008-02-04 ENCOUNTER — Ambulatory Visit: Payer: Self-pay | Admitting: Cardiovascular Disease

## 2008-02-04 LAB — CONVERTED CEMR LAB
BUN: 10 mg/dL (ref 6–23)
Basophils Absolute: 0 10*3/uL (ref 0.0–0.1)
Basophils Relative: 0.4 % (ref 0.0–3.0)
CO2: 28 meq/L (ref 19–32)
Calcium: 8.8 mg/dL (ref 8.4–10.5)
Chloride: 106 meq/L (ref 96–112)
Creatinine, Ser: 0.8 mg/dL (ref 0.4–1.2)
Eosinophils Absolute: 0.1 10*3/uL (ref 0.0–0.7)
Eosinophils Relative: 1.1 % (ref 0.0–5.0)
GFR calc Af Amer: 98 mL/min
GFR calc non Af Amer: 81 mL/min
Glucose, Bld: 109 mg/dL — ABNORMAL HIGH (ref 70–99)
HCT: 34.6 % — ABNORMAL LOW (ref 36.0–46.0)
Hemoglobin: 11.2 g/dL — ABNORMAL LOW (ref 12.0–15.0)
INR: 1.1 — ABNORMAL HIGH (ref 0.8–1.0)
Lymphocytes Relative: 35.1 % (ref 12.0–46.0)
MCHC: 32.5 g/dL (ref 30.0–36.0)
MCV: 68.6 fL — ABNORMAL LOW (ref 78.0–100.0)
Monocytes Absolute: 0.4 10*3/uL (ref 0.1–1.0)
Monocytes Relative: 5.8 % (ref 3.0–12.0)
Neutro Abs: 3.5 10*3/uL (ref 1.4–7.7)
Neutrophils Relative %: 57.6 % (ref 43.0–77.0)
Platelets: 276 10*3/uL (ref 150–400)
Potassium: 3.6 meq/L (ref 3.5–5.1)
Prothrombin Time: 13 s (ref 10.9–13.3)
RBC: 5.04 M/uL (ref 3.87–5.11)
RDW: 16.1 % — ABNORMAL HIGH (ref 11.5–14.6)
Sodium: 141 meq/L (ref 135–145)
WBC: 6.1 10*3/uL (ref 4.5–10.5)
aPTT: 30.4 s — ABNORMAL HIGH (ref 21.7–29.8)

## 2008-02-06 ENCOUNTER — Ambulatory Visit: Payer: Self-pay | Admitting: Cardiovascular Disease

## 2008-02-06 ENCOUNTER — Inpatient Hospital Stay (HOSPITAL_BASED_OUTPATIENT_CLINIC_OR_DEPARTMENT_OTHER): Admission: RE | Admit: 2008-02-06 | Discharge: 2008-02-06 | Payer: Self-pay | Admitting: Cardiovascular Disease

## 2008-02-12 ENCOUNTER — Ambulatory Visit: Payer: Self-pay | Admitting: Internal Medicine

## 2008-02-12 LAB — CONVERTED CEMR LAB
ALT: 49 units/L — ABNORMAL HIGH (ref 0–35)
AST: 38 units/L — ABNORMAL HIGH (ref 0–37)
BUN: 10 mg/dL (ref 6–23)
Basophils Absolute: 0 10*3/uL (ref 0.0–0.1)
Basophils Relative: 0.5 % (ref 0.0–3.0)
CO2: 26 meq/L (ref 19–32)
Calcium: 8.6 mg/dL (ref 8.4–10.5)
Chloride: 107 meq/L (ref 96–112)
Cholesterol: 209 mg/dL (ref 0–200)
Creatinine, Ser: 0.8 mg/dL (ref 0.4–1.2)
Creatinine,U: 341.3 mg/dL
Direct LDL: 202 mg/dL
Eosinophils Absolute: 0.1 10*3/uL (ref 0.0–0.7)
Eosinophils Relative: 1.1 % (ref 0.0–5.0)
GFR calc Af Amer: 98 mL/min
GFR calc non Af Amer: 81 mL/min
Glucose, Bld: 111 mg/dL — ABNORMAL HIGH (ref 70–99)
HCT: 34.1 % — ABNORMAL LOW (ref 36.0–46.0)
HDL: 32.2 mg/dL — ABNORMAL LOW (ref >39.0)
Hemoglobin: 10.9 g/dL — ABNORMAL LOW (ref 12.0–15.0)
Hgb A1c MFr Bld: 6 % (ref 4.6–6.0)
Lymphocytes Relative: 37 % (ref 12.0–46.0)
MCHC: 31.8 g/dL (ref 30.0–36.0)
MCV: 69.6 fL — ABNORMAL LOW (ref 78.0–100.0)
Microalb Creat Ratio: 2.9 mg/g (ref 0.0–30.0)
Microalb, Ur: 1 mg/dL (ref 0.0–1.9)
Monocytes Absolute: 0.4 10*3/uL (ref 0.1–1.0)
Monocytes Relative: 5.8 % (ref 3.0–12.0)
Neutro Abs: 4 10*3/uL (ref 1.4–7.7)
Neutrophils Relative %: 55.6 % (ref 43.0–77.0)
Platelets: 289 10*3/uL (ref 150–400)
Potassium: 3.6 meq/L (ref 3.5–5.1)
RBC: 4.98 M/uL (ref 3.87–5.11)
RDW: 15.6 % — ABNORMAL HIGH (ref 11.5–14.6)
Sodium: 139 meq/L (ref 135–145)
Total CHOL/HDL Ratio: 6.5
Triglycerides: 110 mg/dL (ref 0–149)
VLDL: 22 mg/dL (ref 0–40)
WBC: 7.1 10*3/uL (ref 4.5–10.5)

## 2008-02-19 ENCOUNTER — Ambulatory Visit: Payer: Self-pay | Admitting: Gastroenterology

## 2008-02-19 ENCOUNTER — Ambulatory Visit: Payer: Self-pay | Admitting: Cardiovascular Disease

## 2008-08-08 ENCOUNTER — Encounter (INDEPENDENT_AMBULATORY_CARE_PROVIDER_SITE_OTHER): Payer: Self-pay | Admitting: *Deleted

## 2008-08-08 ENCOUNTER — Emergency Department (HOSPITAL_COMMUNITY): Admission: EM | Admit: 2008-08-08 | Discharge: 2008-08-08 | Payer: Self-pay | Admitting: Emergency Medicine

## 2008-10-22 ENCOUNTER — Emergency Department (HOSPITAL_COMMUNITY): Admission: EM | Admit: 2008-10-22 | Discharge: 2008-10-23 | Payer: Self-pay | Admitting: Emergency Medicine

## 2008-10-24 ENCOUNTER — Encounter (INDEPENDENT_AMBULATORY_CARE_PROVIDER_SITE_OTHER): Payer: Self-pay | Admitting: *Deleted

## 2008-10-24 ENCOUNTER — Emergency Department (HOSPITAL_COMMUNITY): Admission: EM | Admit: 2008-10-24 | Discharge: 2008-10-24 | Payer: Self-pay | Admitting: Emergency Medicine

## 2008-10-25 ENCOUNTER — Ambulatory Visit: Payer: Self-pay | Admitting: Internal Medicine

## 2008-10-25 ENCOUNTER — Inpatient Hospital Stay (HOSPITAL_COMMUNITY): Admission: AD | Admit: 2008-10-25 | Discharge: 2008-11-01 | Payer: Self-pay | Admitting: Internal Medicine

## 2008-10-25 ENCOUNTER — Telehealth: Payer: Self-pay | Admitting: Gastroenterology

## 2008-10-25 DIAGNOSIS — D509 Iron deficiency anemia, unspecified: Secondary | ICD-10-CM | POA: Insufficient documentation

## 2008-10-25 DIAGNOSIS — K5900 Constipation, unspecified: Secondary | ICD-10-CM | POA: Insufficient documentation

## 2008-10-25 DIAGNOSIS — K219 Gastro-esophageal reflux disease without esophagitis: Secondary | ICD-10-CM | POA: Insufficient documentation

## 2008-10-25 DIAGNOSIS — D500 Iron deficiency anemia secondary to blood loss (chronic): Secondary | ICD-10-CM | POA: Insufficient documentation

## 2008-10-25 DIAGNOSIS — R1011 Right upper quadrant pain: Secondary | ICD-10-CM | POA: Insufficient documentation

## 2008-10-26 ENCOUNTER — Encounter: Payer: Self-pay | Admitting: Gastroenterology

## 2008-10-28 ENCOUNTER — Encounter (INDEPENDENT_AMBULATORY_CARE_PROVIDER_SITE_OTHER): Payer: Self-pay | Admitting: Surgery

## 2008-11-11 ENCOUNTER — Ambulatory Visit: Payer: Self-pay | Admitting: Gastroenterology

## 2008-11-11 DIAGNOSIS — R945 Abnormal results of liver function studies: Secondary | ICD-10-CM | POA: Insufficient documentation

## 2008-11-11 LAB — CONVERTED CEMR LAB
GGT: 178 units/L — ABNORMAL HIGH (ref 7–51)
Hgb A2 Quant: 2.3 % (ref 2.2–3.2)
Hgb A: 97.7 % (ref 96.8–97.8)
Hgb F Quant: 0 % (ref 0.0–2.0)
Hgb S Quant: 0 % (ref 0.0–0.0)

## 2008-11-30 ENCOUNTER — Ambulatory Visit: Payer: Self-pay | Admitting: Gastroenterology

## 2008-12-01 ENCOUNTER — Telehealth: Payer: Self-pay | Admitting: Gastroenterology

## 2008-12-01 LAB — CONVERTED CEMR LAB: Fecal Occult Bld: POSITIVE

## 2008-12-06 ENCOUNTER — Encounter: Payer: Self-pay | Admitting: Gastroenterology

## 2008-12-15 ENCOUNTER — Encounter: Payer: Self-pay | Admitting: Internal Medicine

## 2008-12-21 ENCOUNTER — Encounter: Payer: Self-pay | Admitting: Gastroenterology

## 2008-12-24 ENCOUNTER — Ambulatory Visit: Payer: Self-pay | Admitting: Gastroenterology

## 2008-12-29 ENCOUNTER — Ambulatory Visit: Payer: Self-pay | Admitting: Gastroenterology

## 2008-12-29 ENCOUNTER — Encounter: Payer: Self-pay | Admitting: Gastroenterology

## 2008-12-29 LAB — HM COLONOSCOPY

## 2008-12-30 ENCOUNTER — Telehealth: Payer: Self-pay | Admitting: Gastroenterology

## 2008-12-31 ENCOUNTER — Encounter: Payer: Self-pay | Admitting: Gastroenterology

## 2009-01-03 ENCOUNTER — Encounter: Payer: Self-pay | Admitting: Gastroenterology

## 2009-01-03 DIAGNOSIS — K921 Melena: Secondary | ICD-10-CM | POA: Insufficient documentation

## 2009-01-10 ENCOUNTER — Ambulatory Visit: Payer: Self-pay | Admitting: Gastroenterology

## 2009-01-19 ENCOUNTER — Ambulatory Visit: Payer: Self-pay | Admitting: Gastroenterology

## 2009-01-19 ENCOUNTER — Telehealth: Payer: Self-pay | Admitting: Gastroenterology

## 2009-01-22 ENCOUNTER — Emergency Department (HOSPITAL_COMMUNITY): Admission: EM | Admit: 2009-01-22 | Discharge: 2009-01-22 | Payer: Self-pay | Admitting: Emergency Medicine

## 2009-02-21 ENCOUNTER — Inpatient Hospital Stay (HOSPITAL_COMMUNITY): Admission: EM | Admit: 2009-02-21 | Discharge: 2009-02-25 | Payer: Self-pay | Admitting: Emergency Medicine

## 2009-02-21 ENCOUNTER — Ambulatory Visit: Payer: Self-pay | Admitting: Cardiology

## 2009-02-24 ENCOUNTER — Encounter: Payer: Self-pay | Admitting: Cardiology

## 2009-02-25 ENCOUNTER — Telehealth: Payer: Self-pay | Admitting: Gastroenterology

## 2009-03-01 ENCOUNTER — Ambulatory Visit: Payer: Self-pay | Admitting: Internal Medicine

## 2009-03-04 ENCOUNTER — Telehealth: Payer: Self-pay | Admitting: Cardiovascular Disease

## 2009-03-06 ENCOUNTER — Ambulatory Visit: Payer: Self-pay | Admitting: Internal Medicine

## 2009-03-06 ENCOUNTER — Inpatient Hospital Stay (HOSPITAL_COMMUNITY): Admission: EM | Admit: 2009-03-06 | Discharge: 2009-03-12 | Payer: Self-pay | Admitting: Emergency Medicine

## 2009-03-07 ENCOUNTER — Encounter (INDEPENDENT_AMBULATORY_CARE_PROVIDER_SITE_OTHER): Payer: Self-pay | Admitting: *Deleted

## 2009-03-07 ENCOUNTER — Encounter: Payer: Self-pay | Admitting: Gastroenterology

## 2009-03-11 ENCOUNTER — Encounter (INDEPENDENT_AMBULATORY_CARE_PROVIDER_SITE_OTHER): Payer: Self-pay | Admitting: *Deleted

## 2009-03-22 ENCOUNTER — Emergency Department (HOSPITAL_COMMUNITY): Admission: EM | Admit: 2009-03-22 | Discharge: 2009-03-22 | Payer: Self-pay | Admitting: Emergency Medicine

## 2009-03-24 ENCOUNTER — Emergency Department (HOSPITAL_COMMUNITY): Admission: EM | Admit: 2009-03-24 | Discharge: 2009-03-24 | Payer: Self-pay | Admitting: Emergency Medicine

## 2009-03-24 ENCOUNTER — Ambulatory Visit: Payer: Self-pay | Admitting: Gastroenterology

## 2009-03-29 ENCOUNTER — Encounter (INDEPENDENT_AMBULATORY_CARE_PROVIDER_SITE_OTHER): Payer: Self-pay | Admitting: *Deleted

## 2009-04-05 ENCOUNTER — Ambulatory Visit: Payer: Self-pay | Admitting: Internal Medicine

## 2009-04-05 LAB — CONVERTED CEMR LAB
ALT: 22 units/L (ref 0–35)
AST: 16 units/L (ref 0–37)
Albumin: 3.5 g/dL (ref 3.5–5.2)
Alkaline Phosphatase: 138 units/L — ABNORMAL HIGH (ref 39–117)
BUN: 12 mg/dL (ref 6–23)
Bilirubin Urine: NEGATIVE
Bilirubin, Direct: 0 mg/dL (ref 0.0–0.3)
CO2: 27 meq/L (ref 19–32)
Calcium: 8.9 mg/dL (ref 8.4–10.5)
Chloride: 102 meq/L (ref 96–112)
Cholesterol: 233 mg/dL — ABNORMAL HIGH (ref 0–200)
Creatinine, Ser: 0.8 mg/dL (ref 0.4–1.2)
Direct LDL: 171.6 mg/dL
GFR calc non Af Amer: 97.42 mL/min (ref >60)
Glucose, Bld: 92 mg/dL (ref 70–99)
HDL: 36.5 mg/dL — ABNORMAL LOW (ref >39.00)
Ketones, ur: NEGATIVE mg/dL
Leukocytes, UA: NEGATIVE
Nitrite: NEGATIVE
Potassium: 3.7 meq/L (ref 3.5–5.1)
Sodium: 141 meq/L (ref 135–145)
Specific Gravity, Urine: 1.025 (ref 1.000–1.030)
TSH: 0.76 microintl units/mL (ref 0.35–5.50)
Total Bilirubin: 0.4 mg/dL (ref 0.3–1.2)
Total CHOL/HDL Ratio: 6
Total Protein, Urine: NEGATIVE mg/dL
Total Protein: 7.2 g/dL (ref 6.0–8.3)
Triglycerides: 175 mg/dL — ABNORMAL HIGH (ref 0.0–149.0)
Urine Glucose: NEGATIVE mg/dL
Urobilinogen, UA: 0.2 (ref 0.0–1.0)
VLDL: 35 mg/dL (ref 0.0–40.0)
pH: 5.5 (ref 5.0–8.0)

## 2009-04-12 ENCOUNTER — Ambulatory Visit: Payer: Self-pay | Admitting: Internal Medicine

## 2009-04-12 DIAGNOSIS — M2550 Pain in unspecified joint: Secondary | ICD-10-CM | POA: Insufficient documentation

## 2009-04-12 LAB — CONVERTED CEMR LAB
Anti Nuclear Antibody(ANA): NEGATIVE
Ferritin: 131 ng/mL (ref 10–291)
Iron: 52 ug/dL (ref 42–145)
Rhuematoid fact SerPl-aCnc: <20 intl units/mL (ref 0–20)
Saturation Ratios: 15 % — ABNORMAL LOW (ref 20–55)
Sed Rate: 32 mm/hr — ABNORMAL HIGH (ref 0–22)
TIBC: 351 ug/dL (ref 250–470)
Total CK: 87 units/L (ref 7–177)
UIBC: 299 ug/dL
Vit D, 1,25-Dihydroxy: 8 — ABNORMAL LOW (ref 30–89)

## 2009-04-13 ENCOUNTER — Telehealth: Payer: Self-pay | Admitting: Internal Medicine

## 2009-04-22 ENCOUNTER — Telehealth: Payer: Self-pay | Admitting: Gastroenterology

## 2009-04-28 ENCOUNTER — Telehealth: Payer: Self-pay | Admitting: Gastroenterology

## 2009-04-28 ENCOUNTER — Ambulatory Visit: Payer: Self-pay | Admitting: Gastroenterology

## 2009-04-28 LAB — CONVERTED CEMR LAB
Basophils Absolute: 0 10*3/uL (ref 0.0–0.1)
Basophils Relative: 0.2 % (ref 0.0–3.0)
Eosinophils Absolute: 0.1 10*3/uL (ref 0.0–0.7)
Eosinophils Relative: 0.9 % (ref 0.0–5.0)
HCT: 31.3 % — ABNORMAL LOW (ref 36.0–46.0)
Hemoglobin: 10.2 g/dL — ABNORMAL LOW (ref 12.0–15.0)
Lymphocytes Relative: 31.6 % (ref 12.0–46.0)
Lymphs Abs: 2.3 10*3/uL (ref 0.7–4.0)
MCHC: 32.5 g/dL (ref 30.0–36.0)
MCV: 70.7 fL — ABNORMAL LOW (ref 78.0–100.0)
Monocytes Absolute: 0.4 10*3/uL (ref 0.1–1.0)
Monocytes Relative: 5.3 % (ref 3.0–12.0)
Neutro Abs: 4.6 10*3/uL (ref 1.4–7.7)
Neutrophils Relative %: 62 % (ref 43.0–77.0)
Platelets: 220 10*3/uL (ref 150.0–400.0)
RBC: 4.43 M/uL (ref 3.87–5.11)
RDW: 15.8 % — ABNORMAL HIGH (ref 11.5–14.6)
WBC: 7.4 10*3/uL (ref 4.5–10.5)

## 2009-05-02 ENCOUNTER — Ambulatory Visit: Payer: Self-pay | Admitting: Gastroenterology

## 2009-05-04 ENCOUNTER — Encounter: Payer: Self-pay | Admitting: Gastroenterology

## 2009-05-04 ENCOUNTER — Ambulatory Visit: Payer: Self-pay | Admitting: Gastroenterology

## 2009-05-04 LAB — CONVERTED CEMR LAB
Albumin ELP: 49.1 % — ABNORMAL LOW (ref 55.8–66.1)
Alpha-1-Globulin: 8.3 % — ABNORMAL HIGH (ref 2.9–4.9)
Alpha-2-Globulin: 14.1 % — ABNORMAL HIGH (ref 7.1–11.8)
Basophils Absolute: 0 10*3/uL (ref 0.0–0.1)
Basophils Relative: 0.4 % (ref 0.0–3.0)
Beta Globulin: 6.6 % (ref 4.7–7.2)
Eosinophils Absolute: 0 10*3/uL (ref 0.0–0.7)
Eosinophils Relative: 0.6 % (ref 0.0–5.0)
Gamma Globulin: 16.4 % (ref 11.1–18.8)
HCT: 35.1 % — ABNORMAL LOW (ref 36.0–46.0)
Hemoglobin: 11.1 g/dL — ABNORMAL LOW (ref 12.0–15.0)
IgA: 305 mg/dL (ref 68–378)
Lymphocytes Relative: 28.9 % (ref 12.0–46.0)
Lymphs Abs: 2.2 10*3/uL (ref 0.7–4.0)
MCHC: 31.6 g/dL (ref 30.0–36.0)
MCV: 71.8 fL — ABNORMAL LOW (ref 78.0–100.0)
Monocytes Absolute: 0.4 10*3/uL (ref 0.1–1.0)
Monocytes Relative: 5.1 % (ref 3.0–12.0)
Neutro Abs: 5.1 10*3/uL (ref 1.4–7.7)
Neutrophils Relative %: 65 % (ref 43.0–77.0)
Platelets: 225 10*3/uL (ref 150.0–400.0)
RBC: 4.89 M/uL (ref 3.87–5.11)
RDW: 15.1 % — ABNORMAL HIGH (ref 11.5–14.6)
Tissue Transglutaminase Ab, IgA: 0.6 units (ref <7)
Total Protein, Serum Electrophoresis: 7.2 g/dL (ref 6.0–8.3)
WBC: 7.7 10*3/uL (ref 4.5–10.5)

## 2009-05-05 ENCOUNTER — Telehealth: Payer: Self-pay | Admitting: Gastroenterology

## 2009-05-06 ENCOUNTER — Ambulatory Visit: Payer: Self-pay | Admitting: Internal Medicine

## 2009-05-06 LAB — CONVERTED CEMR LAB
ALT: 30 units/L (ref 0–35)
AST: 20 units/L (ref 0–37)
Cholesterol: 206 mg/dL — ABNORMAL HIGH (ref 0–200)
Direct LDL: 138.8 mg/dL
HDL: 48.7 mg/dL (ref >39.00)
Total CHOL/HDL Ratio: 4
Triglycerides: 93 mg/dL (ref 0.0–149.0)
VLDL: 18.6 mg/dL (ref 0.0–40.0)

## 2009-05-15 ENCOUNTER — Emergency Department (HOSPITAL_COMMUNITY): Admission: EM | Admit: 2009-05-15 | Discharge: 2009-05-15 | Payer: Self-pay | Admitting: Emergency Medicine

## 2009-05-18 ENCOUNTER — Ambulatory Visit: Payer: Self-pay | Admitting: Cardiovascular Disease

## 2009-05-23 ENCOUNTER — Ambulatory Visit: Payer: Self-pay | Admitting: Internal Medicine

## 2009-05-23 DIAGNOSIS — E559 Vitamin D deficiency, unspecified: Secondary | ICD-10-CM | POA: Insufficient documentation

## 2009-05-25 ENCOUNTER — Encounter: Payer: Self-pay | Admitting: Internal Medicine

## 2009-05-25 ENCOUNTER — Ambulatory Visit (HOSPITAL_COMMUNITY): Admission: RE | Admit: 2009-05-25 | Discharge: 2009-05-25 | Payer: Self-pay | Admitting: Rheumatology

## 2009-05-25 ENCOUNTER — Telehealth: Payer: Self-pay | Admitting: Gastroenterology

## 2009-05-26 ENCOUNTER — Ambulatory Visit: Payer: Self-pay | Admitting: Gastroenterology

## 2009-05-26 DIAGNOSIS — R1032 Left lower quadrant pain: Secondary | ICD-10-CM | POA: Insufficient documentation

## 2009-05-26 DIAGNOSIS — K625 Hemorrhage of anus and rectum: Secondary | ICD-10-CM | POA: Insufficient documentation

## 2009-05-26 DIAGNOSIS — E785 Hyperlipidemia, unspecified: Secondary | ICD-10-CM | POA: Insufficient documentation

## 2009-05-26 DIAGNOSIS — R110 Nausea: Secondary | ICD-10-CM | POA: Insufficient documentation

## 2009-05-26 LAB — CONVERTED CEMR LAB
Basophils Absolute: 0.1 10*3/uL (ref 0.0–0.1)
Basophils Relative: 0.7 % (ref 0.0–3.0)
Eosinophils Absolute: 0.1 10*3/uL (ref 0.0–0.7)
Eosinophils Relative: 0.7 % (ref 0.0–5.0)
HCT: 34.4 % — ABNORMAL LOW (ref 36.0–46.0)
Hemoglobin: 10.9 g/dL — ABNORMAL LOW (ref 12.0–15.0)
Lymphocytes Relative: 37 % (ref 12.0–46.0)
Lymphs Abs: 3 10*3/uL (ref 0.7–4.0)
MCHC: 31.7 g/dL (ref 30.0–36.0)
MCV: 71.2 fL — ABNORMAL LOW (ref 78.0–100.0)
Monocytes Absolute: 0.6 10*3/uL (ref 0.1–1.0)
Monocytes Relative: 7.1 % (ref 3.0–12.0)
Neutro Abs: 4.4 10*3/uL (ref 1.4–7.7)
Neutrophils Relative %: 54.5 % (ref 43.0–77.0)
Platelets: 230 10*3/uL (ref 150.0–400.0)
RBC: 4.83 M/uL (ref 3.87–5.11)
RDW: 15.2 % — ABNORMAL HIGH (ref 11.5–14.6)
WBC: 8.2 10*3/uL (ref 4.5–10.5)

## 2009-05-30 ENCOUNTER — Encounter: Payer: Self-pay | Admitting: Internal Medicine

## 2009-05-30 ENCOUNTER — Ambulatory Visit (HOSPITAL_COMMUNITY): Admission: RE | Admit: 2009-05-30 | Discharge: 2009-05-30 | Payer: Self-pay | Admitting: Rheumatology

## 2009-06-15 ENCOUNTER — Encounter: Payer: Self-pay | Admitting: Internal Medicine

## 2009-06-15 ENCOUNTER — Ambulatory Visit (HOSPITAL_COMMUNITY): Admission: RE | Admit: 2009-06-15 | Discharge: 2009-06-15 | Payer: Self-pay | Admitting: Rheumatology

## 2009-06-28 ENCOUNTER — Encounter: Payer: Self-pay | Admitting: Internal Medicine

## 2009-07-01 ENCOUNTER — Telehealth: Payer: Self-pay | Admitting: Internal Medicine

## 2009-07-18 ENCOUNTER — Ambulatory Visit: Payer: Self-pay | Admitting: Internal Medicine

## 2009-07-18 LAB — CONVERTED CEMR LAB
BUN: 18 mg/dL (ref 6–23)
CO2: 28 meq/L (ref 19–32)
Calcium: 9.2 mg/dL (ref 8.4–10.5)
Chloride: 102 meq/L (ref 96–112)
Creatinine, Ser: 0.9 mg/dL (ref 0.4–1.2)
GFR calc non Af Amer: 84.95 mL/min (ref >60)
Glucose, Bld: 110 mg/dL — ABNORMAL HIGH (ref 70–99)
Hgb A1c MFr Bld: 6.3 % (ref 4.6–6.5)
Potassium: 3.6 meq/L (ref 3.5–5.1)
Sodium: 139 meq/L (ref 135–145)
Vit D, 25-Hydroxy: 32 ng/mL (ref 30–89)

## 2009-07-20 ENCOUNTER — Ambulatory Visit: Payer: Self-pay | Admitting: Internal Medicine

## 2009-07-20 DIAGNOSIS — M797 Fibromyalgia: Secondary | ICD-10-CM | POA: Insufficient documentation

## 2009-08-03 ENCOUNTER — Telehealth: Payer: Self-pay | Admitting: Internal Medicine

## 2009-08-03 ENCOUNTER — Emergency Department (HOSPITAL_COMMUNITY): Admission: EM | Admit: 2009-08-03 | Discharge: 2009-08-03 | Payer: Self-pay | Admitting: Emergency Medicine

## 2009-08-04 ENCOUNTER — Telehealth: Payer: Self-pay | Admitting: Internal Medicine

## 2009-08-08 ENCOUNTER — Telehealth: Payer: Self-pay | Admitting: Gastroenterology

## 2009-08-12 ENCOUNTER — Ambulatory Visit: Payer: Self-pay | Admitting: Gastroenterology

## 2009-09-14 ENCOUNTER — Ambulatory Visit: Payer: Self-pay | Admitting: Internal Medicine

## 2009-09-14 ENCOUNTER — Emergency Department (HOSPITAL_COMMUNITY): Admission: EM | Admit: 2009-09-14 | Discharge: 2009-09-14 | Payer: Self-pay | Admitting: Emergency Medicine

## 2009-09-14 LAB — CONVERTED CEMR LAB
BUN: 8 mg/dL (ref 6–23)
Basophils Absolute: 0.1 10*3/uL (ref 0.0–0.1)
Basophils Relative: 1.1 % (ref 0.0–3.0)
CO2: 28 meq/L (ref 19–32)
Calcium: 8.6 mg/dL (ref 8.4–10.5)
Chloride: 109 meq/L (ref 96–112)
Creatinine, Ser: 0.8 mg/dL (ref 0.4–1.2)
Eosinophils Absolute: 0.2 10*3/uL (ref 0.0–0.7)
Eosinophils Relative: 4.1 % (ref 0.0–5.0)
GFR calc non Af Amer: 97.25 mL/min (ref >60)
Glucose, Bld: 102 mg/dL — ABNORMAL HIGH (ref 70–99)
HCT: 32.7 % — ABNORMAL LOW (ref 36.0–46.0)
Hemoglobin: 10.3 g/dL — ABNORMAL LOW (ref 12.0–15.0)
Lymphocytes Relative: 38.4 % (ref 12.0–46.0)
Lymphs Abs: 1.8 10*3/uL (ref 0.7–4.0)
MCHC: 31.5 g/dL (ref 30.0–36.0)
MCV: 70.6 fL — ABNORMAL LOW (ref 78.0–100.0)
Monocytes Absolute: 0.5 10*3/uL (ref 0.1–1.0)
Monocytes Relative: 10.5 % (ref 3.0–12.0)
Neutro Abs: 2.2 10*3/uL (ref 1.4–7.7)
Neutrophils Relative %: 45.9 % (ref 43.0–77.0)
Platelets: 222 10*3/uL (ref 150.0–400.0)
Potassium: 3.6 meq/L (ref 3.5–5.1)
RBC: 4.63 M/uL (ref 3.87–5.11)
RDW: 14.9 % — ABNORMAL HIGH (ref 11.5–14.6)
Sodium: 142 meq/L (ref 135–145)
WBC: 4.8 10*3/uL (ref 4.5–10.5)

## 2009-09-19 ENCOUNTER — Ambulatory Visit: Payer: Self-pay | Admitting: Hematology & Oncology

## 2009-09-19 ENCOUNTER — Ambulatory Visit: Payer: Self-pay | Admitting: Internal Medicine

## 2009-09-26 ENCOUNTER — Telehealth: Payer: Self-pay | Admitting: Gastroenterology

## 2009-09-28 ENCOUNTER — Encounter: Payer: Self-pay | Admitting: Internal Medicine

## 2009-09-28 LAB — CBC WITH DIFFERENTIAL (CANCER CENTER ONLY)
BASO#: 0.1 10*3/uL (ref 0.0–0.2)
BASO%: 0.6 % (ref 0.0–2.0)
EOS%: 1.3 % (ref 0.0–7.0)
Eosinophils Absolute: 0.1 10*3/uL (ref 0.0–0.5)
HCT: 37.7 % (ref 34.8–46.6)
HGB: 11.7 g/dL (ref 11.6–15.9)
LYMPH#: 2.9 10*3/uL (ref 0.9–3.3)
LYMPH%: 37.4 % (ref 14.0–48.0)
MCH: 21.1 pg — ABNORMAL LOW (ref 26.0–34.0)
MCHC: 31 g/dL — ABNORMAL LOW (ref 32.0–36.0)
MCV: 68 fL — ABNORMAL LOW (ref 81–101)
MONO#: 0.5 10*3/uL (ref 0.1–0.9)
MONO%: 6 % (ref 0.0–13.0)
NEUT#: 4.3 10*3/uL (ref 1.5–6.5)
NEUT%: 54.7 % (ref 39.6–80.0)
Platelets: 290 10*3/uL (ref 145–400)
RBC: 5.54 10*6/uL — ABNORMAL HIGH (ref 3.70–5.32)
RDW: 15.6 % — ABNORMAL HIGH (ref 10.5–14.6)
WBC: 7.9 10*3/uL (ref 3.9–10.0)

## 2009-09-28 LAB — CHCC SATELLITE - SMEAR

## 2009-09-29 LAB — RETICULOCYTES (CHCC)
ABS Retic: 55.5 10*3/uL (ref 19.0–186.0)
RBC.: 5.55 MIL/uL — ABNORMAL HIGH (ref 3.87–5.11)
Retic Ct Pct: 1 % (ref 0.4–3.1)

## 2009-09-29 LAB — IRON AND TIBC
%SAT: 13 % — ABNORMAL LOW (ref 20–55)
Iron: 50 ug/dL (ref 42–145)
TIBC: 375 ug/dL (ref 250–470)
UIBC: 325 ug/dL

## 2009-09-29 LAB — TRANSFERRIN RECEPTOR, SOLUABLE: Transferrin Receptor, Soluble: 24.4 nmol/L

## 2009-09-29 LAB — FERRITIN: Ferritin: 170 ng/mL (ref 10–291)

## 2009-10-07 ENCOUNTER — Encounter: Payer: Self-pay | Admitting: Internal Medicine

## 2009-10-28 ENCOUNTER — Telehealth: Payer: Self-pay | Admitting: Gastroenterology

## 2009-11-01 ENCOUNTER — Telehealth: Payer: Self-pay | Admitting: Internal Medicine

## 2009-11-01 ENCOUNTER — Ambulatory Visit: Payer: Self-pay | Admitting: Internal Medicine

## 2009-11-01 DIAGNOSIS — R197 Diarrhea, unspecified: Secondary | ICD-10-CM | POA: Insufficient documentation

## 2009-11-01 DIAGNOSIS — R1013 Epigastric pain: Secondary | ICD-10-CM | POA: Insufficient documentation

## 2009-11-02 ENCOUNTER — Telehealth: Payer: Self-pay | Admitting: Internal Medicine

## 2009-11-07 ENCOUNTER — Encounter: Payer: Self-pay | Admitting: Internal Medicine

## 2009-11-14 ENCOUNTER — Ambulatory Visit: Payer: Self-pay | Admitting: Hematology & Oncology

## 2009-11-22 ENCOUNTER — Ambulatory Visit: Payer: Self-pay | Admitting: Internal Medicine

## 2009-11-22 LAB — CONVERTED CEMR LAB
BUN: 18 mg/dL (ref 6–23)
Basophils Absolute: 0 10*3/uL (ref 0.0–0.1)
Basophils Relative: 0.4 % (ref 0.0–3.0)
CO2: 26 meq/L (ref 19–32)
Calcium: 9.3 mg/dL (ref 8.4–10.5)
Chloride: 102 meq/L (ref 96–112)
Creatinine, Ser: 1 mg/dL (ref 0.4–1.2)
Eosinophils Absolute: 0.1 10*3/uL (ref 0.0–0.7)
Eosinophils Relative: 1.3 % (ref 0.0–5.0)
GFR calc non Af Amer: 75.12 mL/min (ref >60)
Glucose, Bld: 119 mg/dL — ABNORMAL HIGH (ref 70–99)
HCT: 33.7 % — ABNORMAL LOW (ref 36.0–46.0)
Hemoglobin: 10.9 g/dL — ABNORMAL LOW (ref 12.0–15.0)
Hgb A1c MFr Bld: 6 % (ref 4.6–6.5)
Lymphocytes Relative: 37.2 % (ref 12.0–46.0)
Lymphs Abs: 2.4 10*3/uL (ref 0.7–4.0)
MCHC: 32.2 g/dL (ref 30.0–36.0)
MCV: 71.1 fL — ABNORMAL LOW (ref 78.0–100.0)
Monocytes Absolute: 0.2 10*3/uL (ref 0.1–1.0)
Monocytes Relative: 3.7 % (ref 3.0–12.0)
Neutro Abs: 3.7 10*3/uL (ref 1.4–7.7)
Neutrophils Relative %: 57.4 % (ref 43.0–77.0)
Platelets: 226 10*3/uL (ref 150.0–400.0)
Potassium: 3.1 meq/L — ABNORMAL LOW (ref 3.5–5.1)
RBC: 4.74 M/uL (ref 3.87–5.11)
RDW: 17.4 % — ABNORMAL HIGH (ref 11.5–14.6)
Sodium: 140 meq/L (ref 135–145)
WBC: 6.4 10*3/uL (ref 4.5–10.5)

## 2009-11-23 ENCOUNTER — Ambulatory Visit: Payer: Self-pay | Admitting: Internal Medicine

## 2009-11-23 LAB — CBC WITH DIFFERENTIAL (CANCER CENTER ONLY)
BASO#: 0.1 10*3/uL (ref 0.0–0.2)
BASO%: 0.7 % (ref 0.0–2.0)
EOS%: 1.4 % (ref 0.0–7.0)
Eosinophils Absolute: 0.1 10*3/uL (ref 0.0–0.5)
HCT: 35.2 % (ref 34.8–46.6)
HGB: 11.4 g/dL — ABNORMAL LOW (ref 11.6–15.9)
LYMPH#: 2.5 10*3/uL (ref 0.9–3.3)
LYMPH%: 34.5 % (ref 14.0–48.0)
MCH: 22.4 pg — ABNORMAL LOW (ref 26.0–34.0)
MCHC: 32.4 g/dL (ref 32.0–36.0)
MCV: 69 fL — ABNORMAL LOW (ref 81–101)
MONO#: 0.2 10*3/uL (ref 0.1–0.9)
MONO%: 3.3 % (ref 0.0–13.0)
NEUT#: 4.3 10*3/uL (ref 1.5–6.5)
NEUT%: 60.1 % (ref 39.6–80.0)
Platelets: 246 10*3/uL (ref 145–400)
RBC: 5.07 10*6/uL (ref 3.70–5.32)
RDW: 15.4 % — ABNORMAL HIGH (ref 10.5–14.6)
WBC: 7.2 10*3/uL (ref 3.9–10.0)

## 2009-11-23 LAB — RETICULOCYTES (CHCC)
ABS Retic: 40.4 10*3/uL (ref 19.0–186.0)
RBC.: 5.05 MIL/uL (ref 3.87–5.11)
Retic Ct Pct: 0.8 % (ref 0.4–3.1)

## 2009-11-23 LAB — CHCC SATELLITE - SMEAR

## 2009-11-23 LAB — FERRITIN: Ferritin: 270 ng/mL (ref 10–291)

## 2010-01-17 ENCOUNTER — Telehealth: Payer: Self-pay | Admitting: Gastroenterology

## 2010-01-17 ENCOUNTER — Encounter: Payer: Self-pay | Admitting: Gastroenterology

## 2010-01-17 DIAGNOSIS — R141 Gas pain: Secondary | ICD-10-CM | POA: Insufficient documentation

## 2010-01-17 DIAGNOSIS — R142 Eructation: Secondary | ICD-10-CM

## 2010-01-17 DIAGNOSIS — R143 Flatulence: Secondary | ICD-10-CM

## 2010-02-03 ENCOUNTER — Ambulatory Visit (HOSPITAL_COMMUNITY): Admission: RE | Admit: 2010-02-03 | Discharge: 2010-02-03 | Payer: Self-pay | Admitting: Gastroenterology

## 2010-02-28 ENCOUNTER — Ambulatory Visit: Payer: Self-pay | Admitting: Hematology & Oncology

## 2010-03-01 ENCOUNTER — Ambulatory Visit: Payer: Self-pay | Admitting: Internal Medicine

## 2010-03-01 DIAGNOSIS — M25559 Pain in unspecified hip: Secondary | ICD-10-CM | POA: Insufficient documentation

## 2010-03-01 LAB — CBC WITH DIFFERENTIAL (CANCER CENTER ONLY)
BASO#: 0 10*3/uL (ref 0.0–0.2)
BASO%: 0.5 % (ref 0.0–2.0)
EOS%: 1.5 % (ref 0.0–7.0)
Eosinophils Absolute: 0.1 10*3/uL (ref 0.0–0.5)
HCT: 35.3 % (ref 34.8–46.6)
HGB: 11.3 g/dL — ABNORMAL LOW (ref 11.6–15.9)
LYMPH#: 2.5 10*3/uL (ref 0.9–3.3)
LYMPH%: 38.4 % (ref 14.0–48.0)
MCH: 22.4 pg — ABNORMAL LOW (ref 26.0–34.0)
MCHC: 32 g/dL (ref 32.0–36.0)
MCV: 70 fL — ABNORMAL LOW (ref 81–101)
MONO#: 0.4 10*3/uL (ref 0.1–0.9)
MONO%: 5.9 % (ref 0.0–13.0)
NEUT#: 3.6 10*3/uL (ref 1.5–6.5)
NEUT%: 53.7 % (ref 39.6–80.0)
Platelets: 237 10*3/uL (ref 145–400)
RBC: 5.04 10*6/uL (ref 3.70–5.32)
RDW: 14 % (ref 10.5–14.6)
WBC: 6.6 10*3/uL (ref 3.9–10.0)

## 2010-03-01 LAB — CHCC SATELLITE - SMEAR

## 2010-03-03 ENCOUNTER — Ambulatory Visit: Payer: Self-pay | Admitting: Diagnostic Radiology

## 2010-03-03 ENCOUNTER — Ambulatory Visit (HOSPITAL_BASED_OUTPATIENT_CLINIC_OR_DEPARTMENT_OTHER): Admission: RE | Admit: 2010-03-03 | Discharge: 2010-03-03 | Payer: Self-pay | Admitting: Internal Medicine

## 2010-03-03 LAB — FERRITIN: Ferritin: 256 ng/mL (ref 10–291)

## 2010-03-03 LAB — HEMOGLOBINOPATHY EVALUATION
Hemoglobin Other: 0 % (ref 0.0–0.0)
Hgb A2 Quant: 2.3 % (ref 2.2–3.2)
Hgb A: 97.7 % (ref 96.8–97.8)
Hgb F Quant: 0 % (ref 0.0–2.0)
Hgb S Quant: 0 % (ref 0.0–0.0)

## 2010-03-03 LAB — RETICULOCYTES (CHCC)
ABS Retic: 50.9 10*3/uL (ref 19.0–186.0)
RBC.: 5.09 MIL/uL (ref 3.87–5.11)
Retic Ct Pct: 1 % (ref 0.4–3.1)

## 2010-03-03 LAB — HM MAMMOGRAPHY: HM Mammogram: NEGATIVE

## 2010-03-15 ENCOUNTER — Emergency Department (HOSPITAL_COMMUNITY): Admission: EM | Admit: 2010-03-15 | Discharge: 2010-03-15 | Payer: Self-pay | Admitting: Emergency Medicine

## 2010-04-26 ENCOUNTER — Ambulatory Visit: Payer: Self-pay | Admitting: Hematology & Oncology

## 2010-05-08 IMAGING — CR DG CHEST 2V
2 series · 2 of 2 positions shown · non-contrast
Comparison: 02/21/2009

CLINICAL DATA: Chest pain

CHEST - 2 VIEW

[w chest pa]
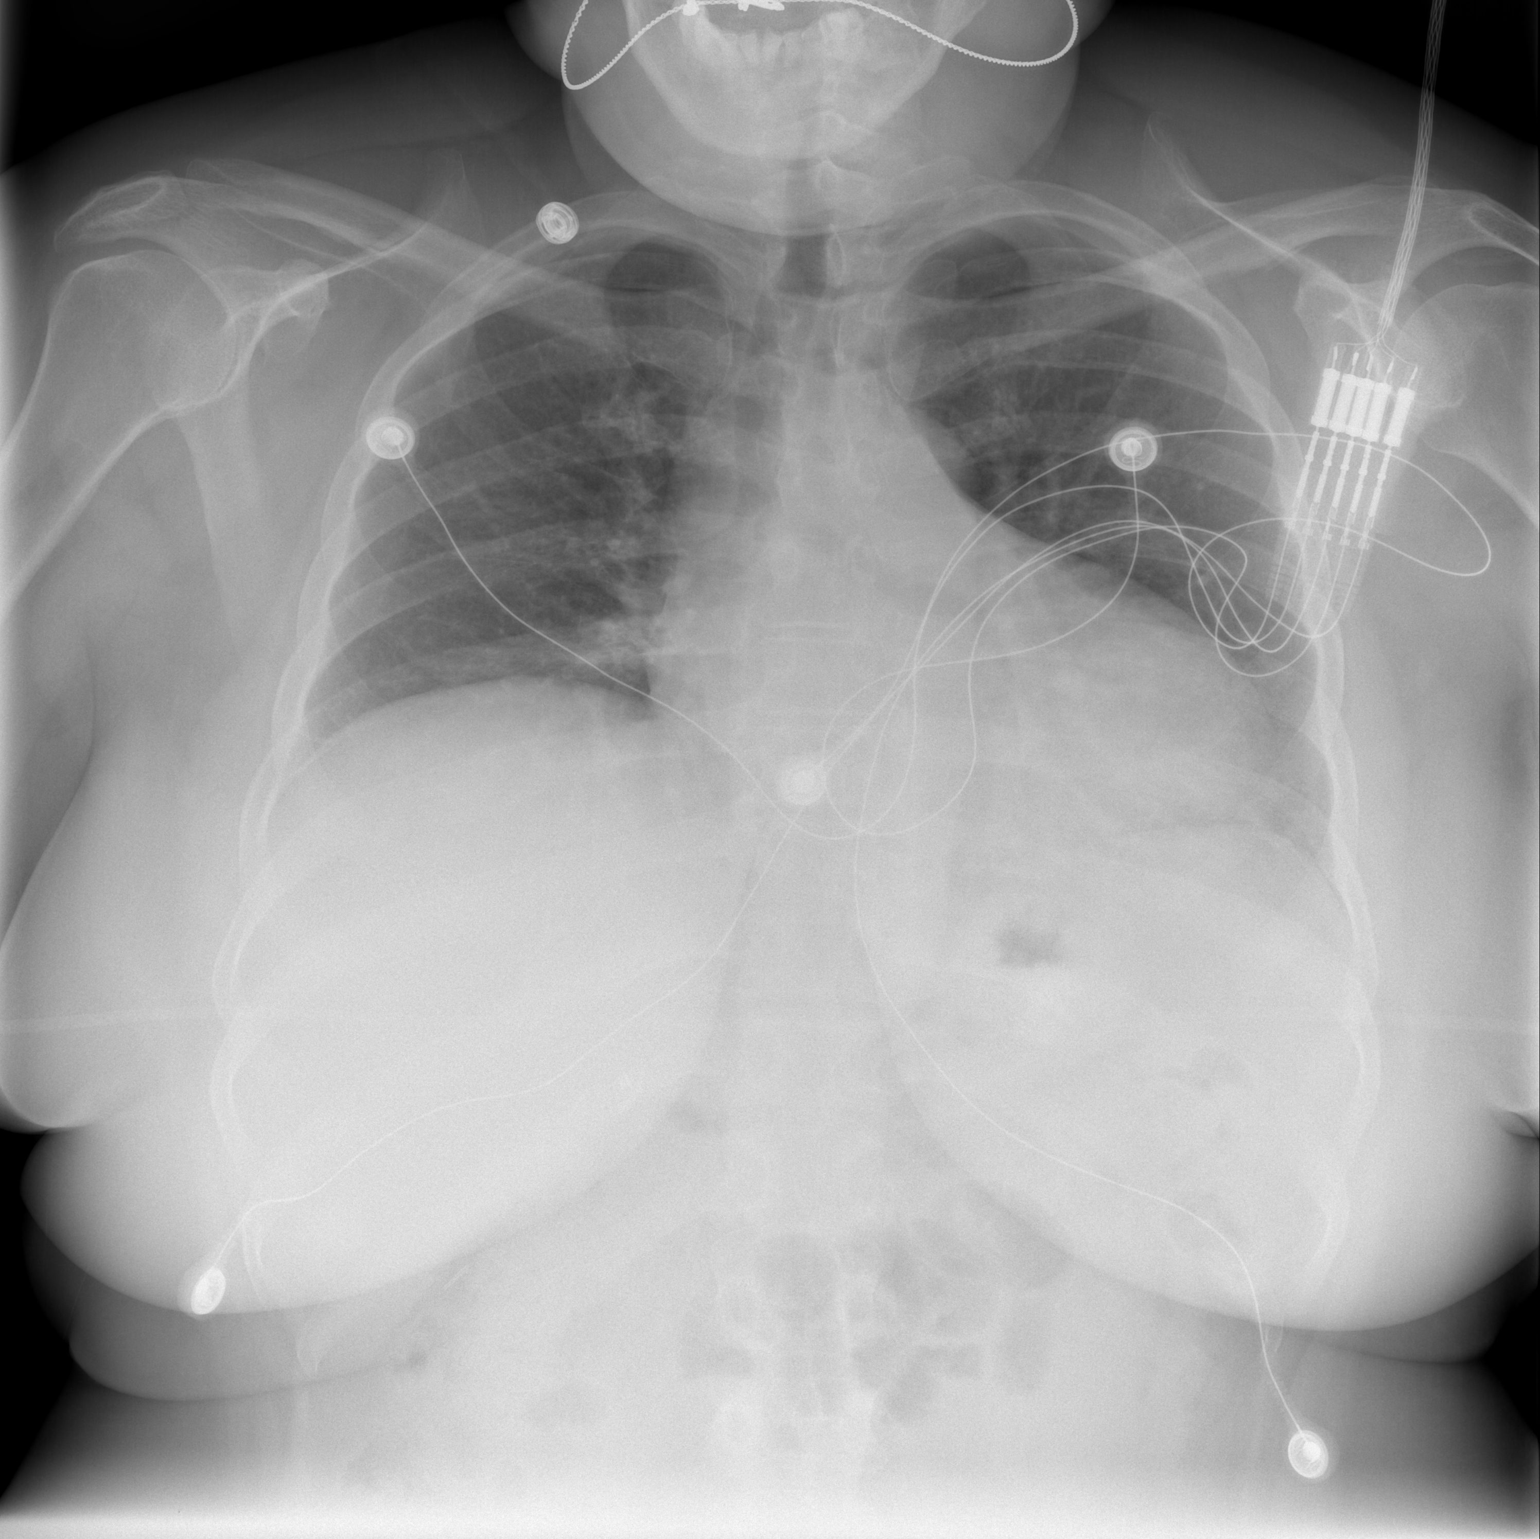

[w chest lat *]
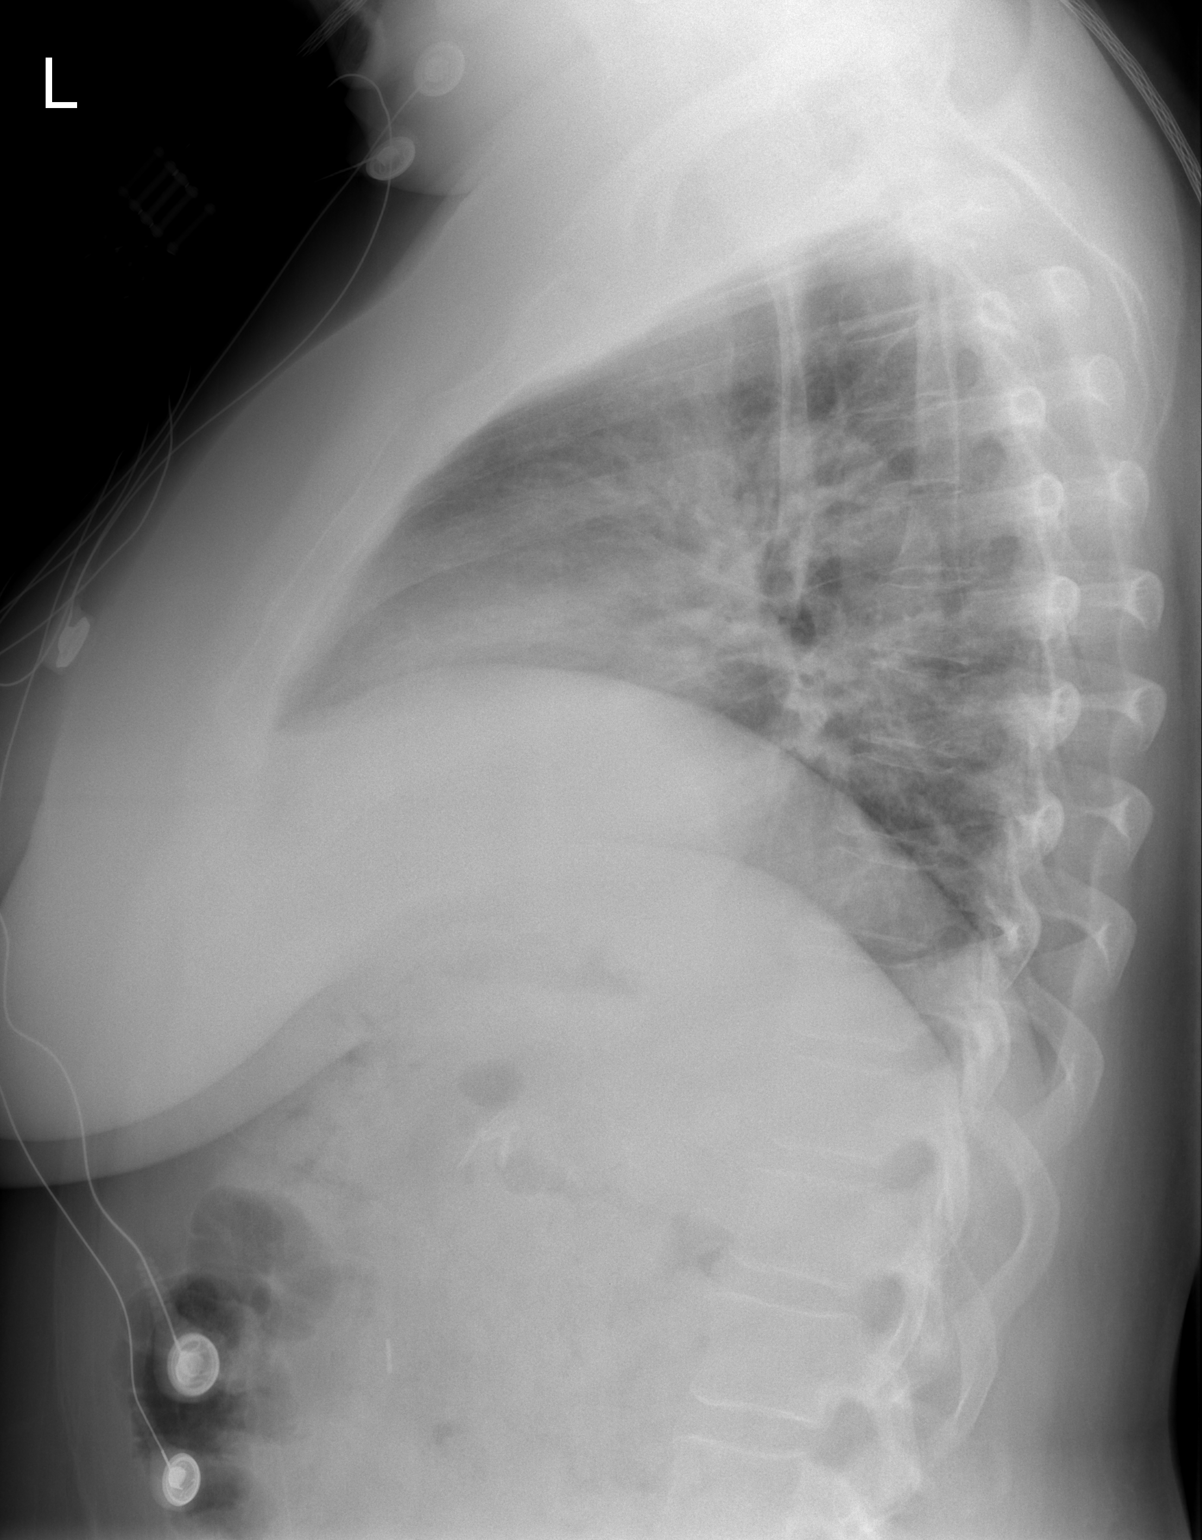

[2 of 2 positions shown; findings below may reference images not displayed]

FINDINGS: Low lung volumes and bibasilar atelectasis.  Heart is
upper normal in size allowing for low lung volumes.  No
pneumothorax.  No pleural effusion.
IMPRESSION: Bibasilar atelectasis and low lung volumes.

## 2010-05-10 ENCOUNTER — Encounter: Payer: Self-pay | Admitting: Internal Medicine

## 2010-05-10 LAB — CBC WITH DIFFERENTIAL (CANCER CENTER ONLY)
BASO#: 0 10*3/uL (ref 0.0–0.2)
BASO%: 0.5 % (ref 0.0–2.0)
EOS%: 2.7 % (ref 0.0–7.0)
Eosinophils Absolute: 0.1 10*3/uL (ref 0.0–0.5)
HCT: 33.5 % — ABNORMAL LOW (ref 34.8–46.6)
HGB: 10.8 g/dL — ABNORMAL LOW (ref 11.6–15.9)
LYMPH#: 2.2 10*3/uL (ref 0.9–3.3)
LYMPH%: 46.3 % (ref 14.0–48.0)
MCH: 22.2 pg — ABNORMAL LOW (ref 26.0–34.0)
MCHC: 32.2 g/dL (ref 32.0–36.0)
MCV: 69 fL — ABNORMAL LOW (ref 81–101)
MONO#: 0.2 10*3/uL (ref 0.1–0.9)
MONO%: 4.8 % (ref 0.0–13.0)
NEUT#: 2.2 10*3/uL (ref 1.5–6.5)
NEUT%: 45.7 % (ref 39.6–80.0)
Platelets: 250 10*3/uL (ref 145–400)
RBC: 4.86 10*6/uL (ref 3.70–5.32)
RDW: 14.1 % (ref 10.5–14.6)
WBC: 4.8 10*3/uL (ref 3.9–10.0)

## 2010-05-10 LAB — CHCC SATELLITE - SMEAR

## 2010-05-12 LAB — IRON AND TIBC
%SAT: 18 % — ABNORMAL LOW (ref 20–55)
Iron: 55 ug/dL (ref 42–145)
TIBC: 314 ug/dL (ref 250–470)
UIBC: 259 ug/dL

## 2010-05-12 LAB — TRANSFERRIN RECEPTOR, SOLUABLE: Transferrin Receptor, Soluble: 11.6 nmol/L

## 2010-05-12 LAB — RETICULOCYTES (CHCC)
ABS Retic: 33.1 10*3/uL (ref 19.0–186.0)
RBC.: 4.73 MIL/uL (ref 3.87–5.11)
Retic Ct Pct: 0.7 % (ref 0.4–3.1)

## 2010-05-12 LAB — FERRITIN: Ferritin: 335 ng/mL — ABNORMAL HIGH (ref 10–291)

## 2010-05-12 LAB — VITAMIN D 25 HYDROXY (VIT D DEFICIENCY, FRACTURES): Vit D, 25-Hydroxy: 21 ng/mL — ABNORMAL LOW (ref 30–89)

## 2010-05-24 ENCOUNTER — Telehealth: Payer: Self-pay | Admitting: Internal Medicine

## 2010-05-26 ENCOUNTER — Telehealth: Payer: Self-pay | Admitting: Gastroenterology

## 2010-06-30 ENCOUNTER — Ambulatory Visit (HOSPITAL_BASED_OUTPATIENT_CLINIC_OR_DEPARTMENT_OTHER): Payer: BC Managed Care – PPO | Admitting: Hematology & Oncology

## 2010-07-03 ENCOUNTER — Ambulatory Visit
Admission: RE | Admit: 2010-07-03 | Discharge: 2010-07-03 | Payer: Self-pay | Source: Home / Self Care | Attending: Internal Medicine | Admitting: Internal Medicine

## 2010-07-03 ENCOUNTER — Encounter: Payer: Self-pay | Admitting: Internal Medicine

## 2010-07-03 DIAGNOSIS — N644 Mastodynia: Secondary | ICD-10-CM | POA: Insufficient documentation

## 2010-07-03 LAB — CBC WITH DIFFERENTIAL (CANCER CENTER ONLY)
BASO#: 0.1 10*3/uL (ref 0.0–0.2)
BASO%: 0.9 % (ref 0.0–2.0)
EOS%: 1.6 % (ref 0.0–7.0)
Eosinophils Absolute: 0.1 10*3/uL (ref 0.0–0.5)
HCT: 38.7 % (ref 34.8–46.6)
HGB: 12.4 g/dL (ref 11.6–15.9)
LYMPH#: 2.9 10*3/uL (ref 0.9–3.3)
LYMPH%: 41.7 % (ref 14.0–48.0)
MCH: 22.7 pg — ABNORMAL LOW (ref 26.0–34.0)
MCHC: 32 g/dL (ref 32.0–36.0)
MCV: 71 fL — ABNORMAL LOW (ref 81–101)
MONO#: 0.4 10*3/uL (ref 0.1–0.9)
MONO%: 6.4 % (ref 0.0–13.0)
NEUT#: 3.4 10*3/uL (ref 1.5–6.5)
NEUT%: 49.4 % (ref 39.6–80.0)
Platelets: 254 10*3/uL (ref 145–400)
RBC: 5.47 10*6/uL — ABNORMAL HIGH (ref 3.70–5.32)
RDW: 15.1 % — ABNORMAL HIGH (ref 10.5–14.6)
WBC: 7 10*3/uL (ref 3.9–10.0)

## 2010-07-03 LAB — CONVERTED CEMR LAB

## 2010-07-03 LAB — CHCC SATELLITE - SMEAR

## 2010-07-04 LAB — RETICULOCYTES (CHCC)
ABS Retic: 47.8 K/uL (ref 19.0–186.0)
RBC.: 5.31 MIL/uL — ABNORMAL HIGH (ref 3.87–5.11)
Retic Ct Pct: 0.9 % (ref 0.4–3.1)

## 2010-07-04 LAB — FERRITIN: Ferritin: 799 ng/mL — ABNORMAL HIGH (ref 10–291)

## 2010-07-04 LAB — IRON AND TIBC
%SAT: 22 % (ref 20–55)
Iron: 81 ug/dL (ref 42–145)
TIBC: 374 ug/dL (ref 250–470)
UIBC: 293 ug/dL

## 2010-07-04 LAB — TRANSFERRIN RECEPTOR, SOLUABLE: Transferrin Receptor, Soluble: 13.7 nmol/L

## 2010-07-05 ENCOUNTER — Encounter
Admission: RE | Admit: 2010-07-05 | Discharge: 2010-07-05 | Payer: Self-pay | Source: Home / Self Care | Attending: Internal Medicine | Admitting: Internal Medicine

## 2010-07-11 ENCOUNTER — Telehealth: Payer: Self-pay | Admitting: Internal Medicine

## 2010-07-11 ENCOUNTER — Ambulatory Visit
Admission: RE | Admit: 2010-07-11 | Discharge: 2010-07-11 | Payer: Self-pay | Source: Home / Self Care | Attending: Internal Medicine | Admitting: Internal Medicine

## 2010-07-12 ENCOUNTER — Emergency Department (HOSPITAL_COMMUNITY)
Admission: EM | Admit: 2010-07-12 | Discharge: 2010-07-12 | Payer: Self-pay | Source: Home / Self Care | Admitting: Emergency Medicine

## 2010-07-12 ENCOUNTER — Ambulatory Visit: Admit: 2010-07-12 | Payer: Self-pay | Admitting: Gastroenterology

## 2010-07-18 ENCOUNTER — Telehealth (INDEPENDENT_AMBULATORY_CARE_PROVIDER_SITE_OTHER): Payer: Self-pay | Admitting: *Deleted

## 2010-07-23 LAB — CONVERTED CEMR LAB
ALT: 27 units/L (ref 0–35)
AST: 28 units/L (ref 0–37)
Albumin: 4.2 g/dL (ref 3.5–5.2)
Alkaline Phosphatase: 128 units/L — ABNORMAL HIGH (ref 39–117)
Amylase: 80 units/L (ref 27–131)
BUN: 14 mg/dL (ref 6–23)
Basophils Absolute: 0 10*3/uL (ref 0.0–0.1)
Basophils Relative: 0 % (ref 0–1)
Bilirubin Urine: NEGATIVE
Bilirubin, Direct: <0.1 mg/dL (ref 0.0–0.3)
CO2: 22 meq/L (ref 19–32)
Calcium: 8.7 mg/dL (ref 8.4–10.5)
Chloride: 102 meq/L (ref 96–112)
Creatinine, Ser: 0.77 mg/dL (ref 0.40–1.20)
Eosinophils Absolute: 0.1 10*3/uL (ref 0.0–0.7)
Eosinophils Relative: 1 % (ref 0–5)
Glucose, Bld: 91 mg/dL (ref 70–99)
Glucose, Urine, Semiquant: NEGATIVE
HCT: 35.5 % — ABNORMAL LOW (ref 36.0–46.0)
Hemoglobin: 11.1 g/dL — ABNORMAL LOW (ref 12.0–15.0)
Ketones, urine, test strip: NEGATIVE
Lipase: 10 units/L (ref 0–75)
Lipase: 17 units/L (ref 11.0–59.0)
Lymphocytes Relative: 29 % (ref 12–46)
Lymphs Abs: 3 10*3/uL (ref 0.7–4.0)
MCHC: 31.3 g/dL (ref 30.0–36.0)
MCV: 70.2 fL — ABNORMAL LOW (ref 78.0–100.0)
Monocytes Absolute: 0.7 10*3/uL (ref 0.1–1.0)
Monocytes Relative: 6 % (ref 3–12)
Neutro Abs: 6.5 10*3/uL (ref 1.7–7.7)
Neutrophils Relative %: 63 % (ref 43–77)
Nitrite: NEGATIVE
Platelets: 212 10*3/uL (ref 150–400)
Potassium: 3.7 meq/L (ref 3.5–5.3)
RBC: 5.06 M/uL (ref 3.87–5.11)
RDW: 17.4 % — ABNORMAL HIGH (ref 11.5–15.5)
Sodium: 139 meq/L (ref 135–145)
Specific Gravity, Urine: 1.02
Total Bilirubin: 0.4 mg/dL (ref 0.3–1.2)
Total Protein: 7.5 g/dL (ref 6.0–8.3)
Urobilinogen, UA: 0.2
WBC Urine, dipstick: NEGATIVE
WBC: 10.3 10*3/uL (ref 4.0–10.5)
pH: 6

## 2010-07-27 NOTE — Letter (Signed)
Summary: Generic Letter  [place] at [facility]  [address]   [place], [place] [address]   Phone: [phone]  Fax: [phone]      11/07/2009    [facility] [address] [place], [place]  [address]   Dear Ms. Elvin So,  We have been unable to reach you by phone and wanted to let you know that your most recent lab results were stable; no acute findings were seen. Please keep your follow up as scheduled on 11/22/09 or call if your symptoms worsen. Please let us know if your phone number has recently changed.  You may contact us at [phone] Monday through Friday from 8am to 5pm.  Sincerely,    Mervin Kung, [facility])

## 2010-07-27 NOTE — Miscellaneous (Signed)
Summary: Capsule Endocscopy/[facility]  [facility]apsule Endocscopy/[facility]   Imported By: Sherian Rein 01/27/2009 11:59:20  _____________________________________________________________________  External Attachment:    Type:   Image     Comment:   External Document

## 2010-07-27 NOTE — Progress Notes (Signed)
Summary: went to er last night   Phone Note Call from Patient Call back at [facility] Phone [phone]   Caller: Patient Call For: [name]  Summary of Call: [name] to [facility] last night.  She was told to get some citramagnesia she got it and took it and it  hasn't helped.  They also gave her  Reglan.  She is still hurting.   Should she call Dr Arlyce Dice or will you tell her what to do?    Initial call taken by: Roselle Locus,  August 04, 2009 10:26 AM  Follow-up for Phone Call        I suggest OV Follow-up by: D. Thomos Lemons DO,  August 04, 2009 12:10 PM  Additional Follow-up for Phone Call Additional follow up Details #1::        have not been able to reach patient.  have left messages with no return call  Roselle Locus  August 08, 2009 1:17 PM

## 2010-07-27 NOTE — Progress Notes (Signed)
Summary: results   Phone Note Call from Patient Call back at [phone]   Caller: Patient Call For: kaplan Reason for Call: Lab or Test Results Summary of Call: lab results Initial call taken by: Tawni Levy,  May 05, 2009 2:24 PM  Follow-up for Phone Call        Pt. informed results are on Dr.kaplan's desk to be reviewed and she will be contacted after he reviews them. Follow-up by: Teryl Lucy RN,  May 05, 2009 2:32 PM  Additional Follow-up for Phone Call Additional follow up Details #1::        Blood counts are stable. Lab work is unrevealing Additional Follow-up by: Louis Meckel MD,  May 06, 2009 11:06 AM    Additional Follow-up for Phone Call Additional follow up Details #2::    Patient 's husband is notified of resutls Follow-up by: Darcey Nora RN, CGRN,  May 06, 2009 1:18 PM

## 2010-07-27 NOTE — Assessment & Plan Note (Signed)
Summary: rov/ gd      Allergies Added: NKDA  Visit Type:  Follow-up Primary Provider:  Thomos Lemons, DO  CC:  None.  History of Present Illness: [name] is a pleasant 50 year old African American female with past medical history significant for hypertension, gastroesophageal reflux disease, gastroparesis, H. pylori, peptic ulcer disease, microscopic anemia, hyperlipidemia, and diabetes mellitus here today for cardiac follow up. She was found during cath 8/09 to have minor plaque in the LAD with no disease in the RCA or Circumflex. Her LV function was normal She denies any CP or palpitations. She has baseline fatigue and some shortness of breath but this has not changed in the last months.   Current Medications (verified): 1)  Nu-Iron 150 Mg Caps (Polysaccharide Iron Complex) .... Take 1 Capsule By Mouth Two Times A Day 2)  Hydrochlorothiazide 25 Mg Tabs (Hydrochlorothiazide) .... Take 1 Tablet By Mouth Once A Day 3)  Lisinopril 20 Mg Tabs (Lisinopril) .... Take 1 Tablet By Mouth Once A Day 4)  Percocet 5-325 Mg Tabs (Oxycodone-Acetaminophen) .... One Tablet By Mouth Every 8 Hours As Needed Pain 5)  Omeprazole 20 Mg Tbec (Omeprazole) .... Take 1 Tablet By Mouth Once A Day 6)  Aspirin 81 Mg Tbec (Aspirin) .... Take 1 Tablet By Mouth Once A Day 7)  Reglan 10 Mg Tabs (Metoclopramide Hcl) .... Take One Tab One Half Hour A.c. and H.s. 8)  Crestor 10 Mg Tabs (Rosuvastatin Calcium) .... One By Mouth Once Daily 9)  Vitamin D (Ergocalciferol) 50000 Unit Caps (Ergocalciferol) .... One By Mouth Q Weekly  Allergies (verified): No Known Drug Allergies  Past History:  Past Medical History: Reviewed history from 04/12/2009 and no changes required. CHEST PAIN, ATYPICAL (ICD-786.59) HYPERLIPIDEMIA (ICD-272.2) HYPERTENSION (ICD-401.9)  GERD (ICD-530.81) DIABETES MELLITUS, TYPE II (ICD-250.00) HEMOCCULT POSITIVE STOOL (ICD-578.1) NONSPECIFIC ABNORMAL RESULTS LIVR FUNCTION STUDY  (ICD-794.8) CONSTIPATION (ICD-564.00) GASTROPARESIS (ICD-536.3) ANEMIA-IRON DEFICIENCY (ICD-280.9) ABDOMINAL PAIN-RUQ (ICD-789.01) LIVER HEMANGIOMA (ICD-228.04)  GASTROPARESIS (ICD-536.3) ESOPHAGEAL STRICTURE (ICD-530.3) ABDOMINAL PAIN, GENERALIZED, CHRONIC (ICD-789.07) ANEMIA, NORMOCYTIC (ICD-285.9) ALLERGIC RHINITIS (ICD-477.9) NAUSEA AND VOMITING (ICD-787.01) ABDOMINAL PAIN, LEFT UPPER QUADRANT (ICD-789.02) MIGRAINE HEADACHE (ICD-346.90) ENDOMETRIOSIS (ICD-617.9) OVARIAN CYST (ICD-620.2) PEPTIC ULCER DISEASE (ICD-533.90) HIATAL HERNIA (ICD-553.3)   Non compliance  Social History: Reviewed history from 04/12/2009 and no changes required. Married Has 2 grown children.  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  Former Smoker Quit tobacco in 1996.  She was a pack a day smoker for approximately 10 years.  Alcohol use-yes: Social Daily Caffeine Use:6 pack of pepsi daily  Illicit Drug Use - no Patient does not get regular exercise.    Review of Systems       The patient complains of fatigue and shortness of breath.  The patient denies malaise, fever, weight gain/loss, vision loss, decreased hearing, hoarseness, chest pain, palpitations, prolonged cough, wheezing, sleep apnea, coughing up blood, abdominal pain, blood in stool, nausea, vomiting, diarrhea, heartburn, incontinence, blood in urine, muscle weakness, joint pain, leg swelling, rash, skin lesions, headache, fainting, dizziness, depression, anxiety, enlarged lymph nodes, easy bruising or bleeding, and environmental allergies.    Vital Signs:  Patient profile:   50 year old female Height:      64 inches Weight:      225 pounds Pulse rate:   88 / minute BP sitting:   125 / 80  (left arm) Cuff size:   large  Vitals Entered By: Burnett Kanaris, CNA (May 18, 2009 1:54 PM)  Physical Exam  General:  General: Well developed,  well nourished, NAD Psychiatric: Mood and affect normal Neck: No JVD, no carotid bruits, no  thyromegaly, no lymphadenopathy. Lungs:Clear bilaterally, no wheezes, rhonci, crackles CV: RRR no murmurs, gallops rubs Abdomen: soft, NT, ND, BS present Extremities: No edema, pulses 2+.    EKG  Procedure date:  05/18/2009  Findings:      NSR, rate 88 bpm.   Impression & Recommendations:  Problem # 1:  CHEST PAIN, ATYPICAL (ICD-786.59) No recurrence. Likely not cardiac related. Minor plaque LAD by cath 8/09, no disease in Circ or RCA. Normal LV function.   The following medications were removed from the medication list:    Amlodipine Besylate 5 Mg Tabs (Amlodipine besylate) .Marland Kitchen... Take 1 tablet by mouth once a day Her updated medication list for this problem includes:    Lisinopril 20 Mg Tabs (Lisinopril) .Marland Kitchen... Take 1 tablet by mouth once a day    Aspirin 81 Mg Tbec (Aspirin) .Marland Kitchen... Take 1 tablet by mouth once a day  Problem # 2:  HYPERTENSION (ICD-401.9) Well controlled on current therapy.  Continue current meds.   The following medications were removed from the medication list:    Amlodipine Besylate 5 Mg Tabs (Amlodipine besylate) .Marland Kitchen... Take 1 tablet by mouth once a day Her updated medication list for this problem includes:    Hydrochlorothiazide 25 Mg Tabs (Hydrochlorothiazide) .Marland Kitchen... Take 1 tablet by mouth once a day    Lisinopril 20 Mg Tabs (Lisinopril) .Marland Kitchen... Take 1 tablet by mouth once a day    Aspirin 81 Mg Tbec (Aspirin) .Marland Kitchen... Take 1 tablet by mouth once a day  Patient Instructions: 1)  Your physician recommends that you schedule a follow-up appointment as needed

## 2010-07-27 NOTE — Consult Note (Signed)
Summary: Dr. Haroldine Laws, ENT  Dr. Haroldine Laws, ENT   Imported By: Maryln Gottron 08/13/2007 15:36:49  _____________________________________________________________________  External Attachment:    Type:   Image     Comment:   External Document

## 2010-07-27 NOTE — Assessment & Plan Note (Signed)
Summary: EPIGASTRIC PAIN,N/V      Denise    History of Present Illness Visit Type: follow up Primary GI MD: Melvia Heaps MD [facility] Primary Provider: Thomos Lemons, D.O. Chief Complaint: epigastric pain x 2 months progressivley getting worse, n/v/d History of Present Illness:   [name] a C-turn still complaining of abdominal pain. She has moderately severe left upper quadrant pain that radiates across to the mid epigastrium that clearly is exacerbated by eating. Pain may last for hours at a time. She has not improved with Robinul. She claims to have pain even when she is not eating. She is becoming increasingly concerned. CT Scan in May 2009 demonstrated a stable liver hemangioma and hepatic steatosis. She takes Prevacid daily.    GI Review of Systems    Reports abdominal pain, bloating, dysphagia with solids, heartburn, nausea, and  vomiting.     Location of  Abdominal pain: epigastric area.     Reports diarrhea.        Prior Medications Reviewed Using: Patient Recall  Updated Prior Medication List: ACTOS 30 MG  TABS (PIOGLITAZONE HCL) by mouth once daily PREVACID SOLUTAB 30 MG  TBDP (LANSOPRAZOLE) Take 1 tablet by mouth once a day CRESTOR 20 MG  TABS (ROSUVASTATIN CALCIUM) one by mouth once daily AMLODIPINE BESYLATE 10 MG  TABS (AMLODIPINE BESYLATE) one by mouth once daily BENICAR 20 MG  TABS (OLMESARTAN MEDOXOMIL) one by mouth qd ROBINUL-FORTE 2 MG  TABS (GLYCOPYRROLATE) Take 1 tablet by mouth two times a day METOCLOPRAMIDE HCL 10 MG  TABS (METOCLOPRAMIDE HCL) Take 1 tablet by mouth four times a day  Current Allergies (reviewed today): ! ACE INHIBITORS  Past Medical History:    Reviewed history from 12/02/2007 and no changes required:       Hypertension       GERD       Gastroparesis       History of H.Pylori       Microsocpic Anemia       Left knee Chondromalacia       Diabetes Type II       Esophageal Stricture S/P dilation       Medical non compliance  ABDOMINAL PAIN, LEFT UPPER QUADRANT (ICD-789.02)       MIGRAINE HEADACHE (ICD-346.90)       ENDOMETRIOSIS (ICD-617.9)       OVARIAN CYST (ICD-620.2)       PEPTIC ULCER DISEASE (ICD-533.90)       HIATAL HERNIA (ICD-553.3)       DYSLIPIDEMIA (ICD-272.4)       HYPERLIPIDEMIA (ICD-272.2)       HYPERTENSION (ICD-401.9)       CHEST PAIN, ATYPICAL (ICD-786.59)       Constipation                Past Surgical History:    Reviewed history from 08/11/2007 and no changes required:       Partial Hysterctomy (1988)       Exploratory Laparotomy w/ BSO (2003)       Lt. Knee (2005)          Family History:    Reviewed history from 12/02/2007 and no changes required:       Mother is age 61 with hypertension.  Father is 61 and also has hypertension, diabetes, noted to have prostate cancer.  She has a grandmother and sister that are diabetic.               No FH  of Colon Cancer:  Social History:    Reviewed history from 12/02/2007 and no changes required:       Married       Has 2 grown children.  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        Former Smoker Quit tobacco in 1996.  She was a pack a day smoker for approximately 10 years.        Alcohol use-yes       Daily Caffeine Use    Review of Systems       The patient complains of allergy/sinus, anemia, arthritis/joint pain, back pain, and shortness of breath.     Vital Signs:  Patient Profile:   50 Years Old Female Height:     64.5 inches Weight:      232.25 pounds BMI:     39.39 Pulse rate:   76 / minute Pulse rhythm:   regular BP sitting:   120 / 66  (left arm)  Vitals Entered By: Christie Nottingham CMA (January 01, 2008 11:09 AM)                  Physical Exam  She is a healthy-appearing female  Abdomen is without masses tenderness organomegaly    Impression & Recommendations:  Problem # 1:  ABDOMINAL PAIN, GENERALIZED, CHRONIC (ICD-789.07) Assessment: Unchanged Etiology for this is not clear. She could have ulcer  or nonulcer dyspepsia. CT Scan does not demonstrate any other intra-abdominal abnormalities that would be causing her pain.  it's possible that the pain could be related to persistent gastroparesis despite therapy with Reglan  Recommendations #1 DC Robinul #2 upper endoscopy #3 gastric antrum scan if endoscopy is not diagnostic  Problem # 2:  ESOPHAGEAL STRICTURE (ICD-530.3) Assessment: Improved Asymptomatic since her last esophageal dilatation  Recommendations #1 repeat dilatation as needed  Problem # 3:  GASTROPARESIS (ICD-536.3) Assessment: Unchanged  Problem # 4:  LIVER HEMANGIOMA (ICD-228.04) Assessment: Unchanged     ]  Appended Document: Orders Update    Clinical Lists Changes  Orders: Added new Test order of EGD (EGD) - Signed

## 2010-07-27 NOTE — Miscellaneous (Signed)
Summary: Med Update  Clinical Lists Changes  Medications: Added new medication of REGLAN 10 MG  TABS (METOCLOPRAMIDE HCL) Take 1 30 minutes before breakfast, lunch, dinner and at bedtime. Added new medication of NEXIUM 40 MG  CPDR (ESOMEPRAZOLE MAGNESIUM) 1 capsule each day 30 minutes before meal

## 2010-07-27 NOTE — Progress Notes (Signed)
Summary: CT Results  Phone Note Outgoing Call   Summary of Call: call pt - CT of abd and pelvis negative for mass or other acute process.  She has incidental benign liver lesion called - hemangioma Initial call taken by: D. Thomos Lemons DO,  Nov 13, 2007 6:01 PM  Follow-up for Phone Call        attempted to contact patient at [phone] no answer, no voice mail Follow-up by: Glendell Docker,  Nov 14, 2007 2:56 PM  Additional Follow-up for Phone Call Additional follow up Details #1::        attempted to reach patient at [phone] no answer no voice mail Additional Follow-up by: Glendell Docker,  Nov 18, 2007 9:38 AM    Additional Follow-up for Phone Call Additional follow up Details #2::    attempted to contact patient at [phone] no answer no voice mail Follow-up by: Glendell Docker,  Nov 19, 2007 2:50 PM        Patient Consent for Use and Disclosure of Protected Health Information Patient: [name]   Completed by: Glendell Docker Date Completed: 11/14/2007

## 2010-07-27 NOTE — Assessment & Plan Note (Signed)
Summary: 6 WEEK FOLLOW UP/[facility]   Vital Signs:  Patient profile:   50 year old female Weight:      227 pounds BMI:     38.50 Temp:     98.1 degrees F oral Pulse rate:   64 / minute Pulse rhythm:   regular Resp:     16 per minute BP sitting:   136 / 100  (right arm) Cuff size:   large  Vitals Entered By: Glendell Docker CMA (April 12, 2009 8:10 AM)  Primary Care Provider:  Thomos Lemons, DO  CC:  6 Week Follow up .  History of Present Illness: 6 Week follow up   Hypertension Follow-Up      This is a 50 year old woman who presents for Hypertension follow-up.  The patient denies lightheadedness and headaches.  The patient denies the following associated symptoms: chest pain.  Compliance with medications (by patient report) has been sporadic.    Pt c/o "bones aching".   arms and leg affected.  no fever.     Allergies (verified): No Known Drug Allergies  Past History:  Past Medical History: CHEST PAIN, ATYPICAL (ICD-786.59) HYPERLIPIDEMIA (ICD-272.2) HYPERTENSION (ICD-401.9)  GERD (ICD-530.81) DIABETES MELLITUS, TYPE II (ICD-250.00) HEMOCCULT POSITIVE STOOL (ICD-578.1) NONSPECIFIC ABNORMAL RESULTS LIVR FUNCTION STUDY (ICD-794.8) CONSTIPATION (ICD-564.00) GASTROPARESIS (ICD-536.3) ANEMIA-IRON DEFICIENCY (ICD-280.9) ABDOMINAL PAIN-RUQ (ICD-789.01) LIVER HEMANGIOMA (ICD-228.04)  GASTROPARESIS (ICD-536.3) ESOPHAGEAL STRICTURE (ICD-530.3) ABDOMINAL PAIN, GENERALIZED, CHRONIC (ICD-789.07) ANEMIA, NORMOCYTIC (ICD-285.9) ALLERGIC RHINITIS (ICD-477.9) NAUSEA AND VOMITING (ICD-787.01) ABDOMINAL PAIN, LEFT UPPER QUADRANT (ICD-789.02) MIGRAINE HEADACHE (ICD-346.90) ENDOMETRIOSIS (ICD-617.9) OVARIAN CYST (ICD-620.2) PEPTIC ULCER DISEASE (ICD-533.90) HIATAL HERNIA (ICD-553.3)   Non compliance  Past Surgical History: Exploratory Laparotomy w/ BSO (2003)  Lt. Knee (2005)  Hysterectomy Cholecystectomy  Cardiac cath 2009 - mild non obstructive CAD  Family History: Mother is  age 85 with hypertension.  Father is 21 and also has hypertension, diabetes, noted to have prostate cancer.  She has a grandmother and sister that are diabetic.  No FH of Colon Cancer: Family History of Kidney Disease:Father     Social History: Married Has 2 grown children.  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  Former Smoker Quit tobacco in 1996.  She was a pack a day smoker for approximately 10 years.  Alcohol use-yes: Social Daily Caffeine Use:6 pack of pepsi daily  Illicit Drug Use - no Patient does not get regular exercise.    Physical Exam  General:  alert, well-developed, and well-nourished.   Neck:  Supple; no masses or thyromegaly.no carotid bruits.   Lungs:  normal respiratory effort and normal breath sounds.   Heart:  normal rate, regular rhythm, no murmur, and no gallop.   Msk:  normal ROM, no joint swelling, no joint warmth, and no redness over joints.   Neurologic:  cranial nerves II-XII intact and gait normal.   Psych:  normally interactive and good eye contact.     Impression & Recommendations:  Problem # 1:  ARTHRALGIA (ICD-719.40) 50 y/o AA female complains of bilateral "aching bones".  Pt with hx of DM II.  rule out hemochromatosis.  I doubt RA.  Her symptoms may be due to simvastatin.  change to crestor.  refer to Dr. Corliss Skains for further eval.  Orders: T-Sed Rate (Automated) [phone]) T-Rheumatoid Factor [phone]) T-Antinuclear Antib (ANA) [phone]) T-Ferritin [phone]) T-Iron Binding Capacity (TIBC) ([phone]) T-Iron ([phone]) T-CK Total [phone]) T- * Misc. Laboratory test [phone]) Rheumatology Referral (Rheumatology)  Problem # 2:  HYPERLIPIDEMIA (ICD-272.2) Change simvastatin to Crestor.  Pt  having "aching bones".  Her updated medication list for this problem includes:    Simvastatin 80 Mg Tabs (Simvastatin) .Marland Kitchen... 1/2 tablet by mouth at bedtime    Crestor 10 Mg Tabs (Rosuvastatin calcium) ..... One by mouth once  daily  Labs Reviewed: SGOT: 16 (04/05/2009)   SGPT: 22 (04/05/2009)   HDL:36.50 (04/05/2009), 32.2 (02/12/2008)  LDL:DEL (02/12/2008), DEL (04/10/2007)  Chol:233 (04/05/2009), 209 (02/12/2008)  Trig:175.0 (04/05/2009), 110 (02/12/2008)  Problem # 3:  HYPERTENSION (ICD-401.9) Assessment: Improved I urged compliance.  Pill box provided.  Her updated medication list for this problem includes:    Hydrochlorothiazide 25 Mg Tabs (Hydrochlorothiazide) .Marland Kitchen... Take 1 tablet by mouth once a day    Lisinopril 20 Mg Tabs (Lisinopril) .Marland Kitchen... Take 1 tablet by mouth once a day    Amlodipine Besylate 5 Mg Tabs (Amlodipine besylate) .Marland Kitchen... Take 1 tablet by mouth once a day  BP today: 136/100 Prior BP: 162/86 (03/24/2009)  Labs Reviewed: K+: 3.7 (04/05/2009) Creat: : 0.8 (04/05/2009)   Chol: 233 (04/05/2009)   HDL: 36.50 (04/05/2009)   LDL: DEL (02/12/2008)   TG: 175.0 (04/05/2009)  Complete Medication List: 1)  Simvastatin 80 Mg Tabs (Simvastatin) .... 1/2 tablet by mouth at bedtime 2)  Nu-iron 150 Mg Caps (Polysaccharide iron complex) .... Take 1 capsule by mouth two times a day 3)  Hydrochlorothiazide 25 Mg Tabs (Hydrochlorothiazide) .... Take 1 tablet by mouth once a day 4)  Lisinopril 20 Mg Tabs (Lisinopril) .... Take 1 tablet by mouth once a day 5)  Percocet 5-325 Mg Tabs (Oxycodone-acetaminophen) .... One tablet by mouth every 8 hours as needed pain 6)  Omeprazole 20 Mg Tbec (Omeprazole) .... Take 1 tablet by mouth once a day 7)  Amlodipine Besylate 5 Mg Tabs (Amlodipine besylate) .... Take 1 tablet by mouth once a day 8)  Aspirin 81 Mg Tbec (Aspirin) .... Take 1 tablet by mouth once a day 9)  Reglan 10 Mg Tabs (Metoclopramide hcl) .... Take one tab one half hour a.c. and h.s. 10)  Crestor 10 Mg Tabs (Rosuvastatin calcium) .... One by mouth once daily  Patient Instructions: 1)  Please schedule a follow-up appointment in 6 weeks. 2)  Lipid Panel prior to visit, ICD-9:  272.4 3)  AST, ALT:   272.4 4)  Please return for lab work one (1) week before your next appointment.  Prescriptions: CRESTOR 10 MG TABS (ROSUVASTATIN CALCIUM) one by mouth once daily  #30 x 5   Entered and Authorized by:   D. Thomos Lemons DO   Signed by:   D. Thomos Lemons DO on 04/12/2009   Method used:   Electronically to        [facility] (retail)       [address]       [place], [place]  [address]       Ph: [phone]       Fax: [phone]   RxID:   [id number]   Current Allergies (reviewed today): No known allergies    Immunization History:  Influenza Immunization History:    Influenza:  fluvax non-mcr (03/08/2009)  Pneumovax Immunization History:    Pneumovax:  pneumovax (03/08/2009)

## 2010-07-27 NOTE — Procedures (Signed)
Summary: Capsule endo instructions/[facility]  Capsule endo instructions/ [facility]   Imported By: Lester [place] 01/04/2009 08:00:30  _____________________________________________________________________  External Attachment:    Type:   Image     Comment:   External Document

## 2010-07-27 NOTE — Progress Notes (Signed)
Summary: triage   Phone Note Call from Patient Call back at Home Phone [phone]   Caller: Patient Call For: [name] Reason for Call: Talk to Nurse Summary of Call: Patient wants to know if she can be seen before first available due to non cardiac chest pain and was just released from the hospital Initial call taken by: Tawni Levy,  February 25, 2009 3:16 PM  Follow-up for Phone Call        Per Discharge instructions-pt. to see Dr.Yoo in 2 weeks, she will call for that appt. She will see Dr.Kaplan on 03-22-09 at 9:30am Pt. instructed to call back as needed.  Follow-up by: Laureen Ochs LPN,  February 25, 2009 3:29 PM

## 2010-07-27 NOTE — Progress Notes (Signed)
Summary: TRIAGE--Abd Pain   Phone Note Call from Patient Call back at [phone] cell   Call For: Dr Arlyce Dice Reason for Call: Talk to Nurse Summary of Call: Terrible abd pain. Went to ER twice over the weekend doesnt know what else to do. Initial call taken by: Leanor Kail [facility],  Oct 25, 2008 10:11 AM  Follow-up for Phone Call        Pt. will see Amy today at 2pm.   Follow-up by: Laureen Ochs LPN,  Oct 26, 4538 10:53 AM

## 2010-07-27 NOTE — Procedures (Signed)
Summary: Colonoscopy   Colonoscopy  Procedure date:  12/29/2008  Findings:      [facility]    Procedures Next Due Date:    Colonoscopy: 12/2013  COLONOSCOPY PROCEDURE REPORT  PATIENT:  [name], [name]  MR#:  [id number] BIRTHDATE:   September 05, 1960, 48 yrs. old   GENDER:   female  ENDOSCOPIST:   Barbette Hair. Arlyce Dice, MD Referred by:   PROCEDURE DATE:  12/29/2008 PROCEDURE:  Colon with cold biopsy polypectomy ASA CLASS:   Class II INDICATIONS: Iron Deficiency Anemia, heme positive stool EGD 5/10 negative for bleeding source  MEDICATIONS:    Fentanyl 75 mcg IV, Versed 8 mg IV, Benadryl 50 mg IV  DESCRIPTION OF PROCEDURE:   After the risks benefits and alternatives of the procedure were thoroughly explained, informed consent was obtained.  Digital rectal exam was performed and revealed no abnormalities.   The LB [id number] endoscope was introduced through the anus and advanced to the cecum, which was identified by the ileocecal valve, without limitations.  The quality of the prep was adequate, using MiraLax.  The instrument was then slowly withdrawn as the colon was fully examined. <<PROCEDUREIMAGES>>                      <<OLD IMAGES>>  FINDINGS:  A sessile polyp was found in the cecum. It was 2 mm in size. The polyp was removed using cold biopsy forceps (see image2).  A sessile polyp was found in the ascending colon. It was 2 mm in size. The polyp was removed using cold biopsy forceps (see image4).  This was otherwise a normal examination of the colon (see image1, image3, image5, image6, image7, image8, image9, image10, image11, and image12).   Retroflexed views in the rectum revealed no abnormalities.    The scope was then withdrawn from the patient and the procedure completed.  COMPLICATIONS:   None  ENDOSCOPIC IMPRESSION:  1) 2 mm sessile polyp in the cecum  2) 2 mm sessile polyp in the ascending colon  3) Otherwise normal examination Findings do  not explain source for heme positive stools or anemia RECOMMENDATIONS:  1) capsule endoscopy  2) If the polyp(s) removed today are proven to be adenomatous (pre-cancerous) polyps, you will need a repeat colonoscopy in 5 years. Otherwise you should continue to follow colorectal cancer screening guidelines for "routine risk" patients with colonoscopy in 10 years.  REPEAT EXAM:   In 5 year(s) for Colonoscopy.  if adenoma   _______________________________ Barbette Hair. Arlyce Dice, MD  CC: Thomos Lemons, DO     REPORT OF SURGICAL PATHOLOGY   Case #: [id number] Patient Name: [name], [name]. Office Chart Number:  [id number]   MRN: [id number] Pathologist: Beulah Gandy. Luisa Hart, MD DOB/Age  [birth year] (Age: 50)    Gender: F Date Taken:  12/29/2008 Date Received: 12/29/2008   FINAL DIAGNOSIS   ***MICROSCOPIC EXAMINATION AND DIAGNOSIS***   COLON, CECAL AND ASCENDING POLYPS:  TUBULAR ADENOMA AND FRAGMENTS OF BENIGN COLONIC MUCOSA.  NO HIGH GRADE DYSPLASIA OR MALIGNANCY IDENTIFIED.   mj Date Reported:  12/30/2008     Beulah Gandy. Luisa Hart, MD *** Electronically Signed Out By JDP ***   December 31, 2008 MRN: [id number]    [facility] [address] [place], [place]  [address]    Dear [name],  I am pleased to inform you that the colon polyp(s) removed during your recent colonoscopy was (were) found to be benign (no cancer detected) upon pathologic examination.  I recommend you have a repeat colonoscopy examination in 5_ years to look for recurrent polyps, as having colon polyps increases your risk for having recurrent polyps or even colon cancer in the future.  Should you develop new or worsening symptoms of abdominal pain, bowel habit changes or bleeding from the rectum or bowels, please schedule an evaluation with either your primary care physician or with me.  Additional information/recommendations:  __ No further action with gastroenterology is needed at this time. Please      follow-up  with your primary care physician for your other healthcare      needs.  __ Please call [phone] to schedule a return visit to review your      situation.  __ Please keep your follow-up visit as already scheduled.  _x_ Continue treatment plan as outlined the day of your exam.  Please call us if you are having persistent problems or have questions about your condition that have not been fully answered at this time.  Sincerely,  Louis Meckel MD  This letter has been electronically signed by your physician.   Signed by Louis Meckel MD on 12/31/2008 at 8:39 AM   This report was created from the original endoscopy report, which was reviewed and signed by the above listed endoscopist.

## 2010-07-27 NOTE — Progress Notes (Signed)
Summary: NEEDS APT   Phone Note Call from Patient   Summary of Call: Pt called c/o migraine h/a, asked if she should go to ER or come in office. I left vm to call office to schedule  Initial call taken by: Lamar Sprinkles,  Oct 28, 2007 9:52 AM  Follow-up for Phone Call        Multiple attempts to contact pt, all unsuccessfull Follow-up by: Lamar Sprinkles,  Oct 28, 2007 4:58 PM

## 2010-07-27 NOTE — Assessment & Plan Note (Signed)
Summary: hospital follow up/mhf   Vital Signs:  Patient profile:   50 year old female Height:      64.5 inches Weight:      225.50 pounds BMI:     38.25 Temp:     98.9 degrees F oral Pulse rate:   80 / minute Pulse rhythm:   regular Resp:     18 per minute BP sitting:   150 / 90  (right arm) Cuff size:   large  Vitals Entered By: Glendell Docker CMA (March 01, 2009 2:43 PM)  Primary Care Provider:  Thomos Lemons, DO  CC:  Hospital Follow up.  History of Present Illness: 50 y/o AA female with hx of htn, atypical chest pain and hyperlipidemia for hosp f/u.  Pt admitted 8/30 to 9/3 for atypical c/p and hypertensive urgency.   Pt has not f/u in our office for over a year.  She has hx of non compliance.  She got tired of taking her BP meds.  She admits to not taking meds for over a year.    Pt wsa seen by cardiology.   Chest pain attributed to BP elevation.   Cardiac markers were negative. EKG in the hospital showed normal sinus rhythm with findings consistent with LVH, no acute ischemic changes. Echocardiogram was repeated. It showed EF of 60-65% and grade 1 diastolic dysfunction. Cardiology reviewed patient's previous cardiac catheterization in August of 2009. They recommended further titration of antihypertensive medication.  Hospital records reviewed. Urine drug screen positive for cannabis and opiates.  Since hospital discharge patient reports she has not taken any of her blood pressure medications. She wanted to wait to today's visit to clarify which blood pressure medications patient was to take. Patient reports intermittent chest discomfort.  Preventive Screening-Counseling & Management  Alcohol-Tobacco     Alcohol drinks/day: 2-3per week     Alcohol type: all     Alcohol Counseling: to decrease amount and/or frequency of alcohol intake     Smoking Status: quit     Packs/Day: 2.0     Year Quit: 1994     Tobacco Counseling: not to resume use of tobacco  products  Caffeine-Diet-Exercise     Caffeine use/day: 6 beveragee daily     Caffeine Counseling: decrease use of caffeine     Does Patient Exercise: yes     Type of exercise: walking     Times/week: 3  Past History:  Past Medical History: CHEST PAIN, ATYPICAL (ICD-786.59) HYPERLIPIDEMIA (ICD-272.2) HYPERTENSION (ICD-401.9) GERD (ICD-530.81) DIABETES MELLITUS, TYPE II (ICD-250.00) HEMOCCULT POSITIVE STOOL (ICD-578.1) NONSPECIFIC ABNORMAL RESULTS LIVR FUNCTION STUDY (ICD-794.8) CONSTIPATION (ICD-564.00) GASTROPARESIS (ICD-536.3) ANEMIA-IRON DEFICIENCY (ICD-280.9) ABDOMINAL PAIN-RUQ (ICD-789.01) LIVER HEMANGIOMA (ICD-228.04)  GASTROPARESIS (ICD-536.3) ESOPHAGEAL STRICTURE (ICD-530.3) ABDOMINAL PAIN, GENERALIZED, CHRONIC (ICD-789.07) ANEMIA, NORMOCYTIC (ICD-285.9) ALLERGIC RHINITIS (ICD-477.9) NAUSEA AND VOMITING (ICD-787.01) ABDOMINAL PAIN, LEFT UPPER QUADRANT (ICD-789.02) MIGRAINE HEADACHE (ICD-346.90) ENDOMETRIOSIS (ICD-617.9) OVARIAN CYST (ICD-620.2) PEPTIC ULCER DISEASE (ICD-533.90) HIATAL HERNIA (ICD-553.3)   Non compliance  Past Surgical History: Exploratory Laparotomy w/ BSO (2003) Lt. Knee (2005)  Hysterectomy Cholecystectomy  Cardiac cath 2009 - mild non obstructive CAD  Social History: Packs/Day:  2.0 Caffeine use/day:  6 beveragee daily Does Patient Exercise:  yes  Review of Systems       The patient complains of chest pain.  The patient denies syncope, peripheral edema, prolonged cough, abdominal pain, melena, hematochezia, and severe indigestion/heartburn.    Physical Exam  General:  alert, well-developed, and well-nourished.   Head:  normocephalic and atraumatic.  Neck:  Supple; no masses or thyromegaly.no carotid bruits.   Lungs:  normal respiratory effort and normal breath sounds.   Heart:  normal rate, regular rhythm, no murmur, and no gallop.   Abdomen:  soft, non-tender, and normal bowel sounds.   Extremities:  No lower extremity  edema  Neurologic:  cranial nerves II-XII intact and gait normal.   Psych:  normally interactive, good eye contact, not anxious appearing, and not depressed appearing.     Impression & Recommendations:  Problem # 1:  CHEST PAIN, ATYPICAL (ICD-90.76) 50 year old [place] American female with chest pain secondary to hypertensive urgency. Her symptoms are improved. Patient has not started her antihypertensives. Her EKG is stable. Patient strongly advised to start lisinopril hydrochlorothiazide. Hold off on amlodipine for now.  Problem # 2:  HYPERTENSION (ICD-401.9) Stressed importance of medication compliance.  Explained risk of untreated hypertension.  If continued medical non compliance, pt will be discharged from practice.  The following medications were removed from the medication list:    Amlodipine Besylate 10 Mg Tabs (Amlodipine besylate) ..... One by mouth once daily    Benicar 20 Mg Tabs (Olmesartan medoxomil) ..... One by mouth qd Her updated medication list for this problem includes:    Hydrochlorothiazide 25 Mg Tabs (Hydrochlorothiazide) .Marland Kitchen... Take 1 tablet by mouth once a day    Lisinopril 20 Mg Tabs (Lisinopril) .Marland Kitchen... Take 1 tablet by mouth once a day  BP today: 150/90 Prior BP: 198/88 (01/19/2009)  Labs Reviewed: K+: 3.6 (02/12/2008) Creat: : 0.8 (02/12/2008)   Chol: 209 (02/12/2008)   HDL: 32.2 (02/12/2008)   LDL: DEL (02/12/2008)   TG: 110 (02/12/2008)  Problem # 3:  HYPERLIPIDEMIA (ICD-272.2) Limited by poor compliance.  Her updated medication list for this problem includes:    Simvastatin 80 Mg Tabs (Simvastatin) .Marland Kitchen... 1/2 by mouth qpm  Labs Reviewed: SGOT: 38 (02/12/2008)   SGPT: 49 (02/12/2008)   HDL:32.2 (02/12/2008), 47.5 (04/10/2007)  LDL:DEL (02/12/2008), DEL (04/10/2007)  Chol:209 (02/12/2008), 265 (04/10/2007)  Trig:110 (02/12/2008), 65 (04/10/2007)  Problem # 4:  DIABETES MELLITUS, TYPE II (ICD-250.00) Follow A1c.   The following medications were  removed from the medication list:    Benicar 20 Mg Tabs (Olmesartan medoxomil) ..... One by mouth qd Her updated medication list for this problem includes:    Lisinopril 20 Mg Tabs (Lisinopril) .Marland Kitchen... Take 1 tablet by mouth once a day  Labs Reviewed: Creat: 0.8 (02/12/2008)    Reviewed HgBA1c results: 6.0 (02/12/2008)  6.5 (04/10/2007)  Complete Medication List: 1)  Simvastatin 80 Mg Tabs (Simvastatin) .... 1/2 by mouth qpm 2)  Nu-iron 150 Mg Caps (Polysaccharide iron complex) .... Take 1 capsule by mouth two times a day 3)  Hydrochlorothiazide 25 Mg Tabs (Hydrochlorothiazide) .... Take 1 tablet by mouth once a day 4)  Lisinopril 20 Mg Tabs (Lisinopril) .... Take 1 tablet by mouth once a day 5)  Percocet 5-325 Mg Tabs (Oxycodone-acetaminophen) .... One tablet by mouth every 8 hours as needed pain 6)  Omeprazole 20 Mg Tbec (Omeprazole) .... Take 1 tablet by mouth once a day   Patient Instructions: 1)  Please schedule a follow-up appointment in 6 weeks. 2)  BMP prior to visit, ICD-9:  401.9 3)  Hepatic Panel prior to visit, ICD-9: 272.4 4)  Lipid Panel prior to visit, ICD-9: 272.4 5)  TSH prior to visit, ICD-9: 272.4 6)  HbgA1C prior to visit, ICD-9: 250.00 7)  Urine Microalbumin prior to visit, ICD-9: 250.00 8)  Please return for lab  work one (1) week before your next appointment.  9)  Call our office if your symptoms do not  improve or gets worse.   Preventive Care Screening  Mammogram:    Date:  03/23/2008    Results:  normal

## 2010-07-27 NOTE — Assessment & Plan Note (Signed)
Summary: F/U HEADACHE/BP/JK   Vital Signs:  Patient Profile:   50 Years Old Female Height:     64.5 inches Weight:      241.50 pounds BMI:     40.96 Temp:     97.7 degrees F oral Pulse rate:   63 / minute BP sitting:   164 / 87  (right arm)  Vitals Entered By: Glendell Docker (Oct 31, 2007 2:36 PM)                 Chief Complaint:  F/U BLOOD PRESSURE & HEADACHE.  History of Present Illness: 50 y/o AA female for HA follow up.  Pt reports evaluation at [facility].  She received demerol IV.  Pt reports headaches are improved.  They noted elevatd BP.  She reports taking her medications regularly.  My nurse called her pharmacy and she last picked up her BP meds in late Jan, 2009.  She admits to non compliance.       Current Allergies (reviewed today): ! ACE INHIBITORS  Past Medical History:    Hypertension    GERD    Gastroparesis    History of H.Pylori    Microsocpic Anemia    Left knee Chondromalacia    Diabetes Type II    Esophageal Stricture S/P dilation    Medical non compliance    Current Problems:     ABDOMINAL PAIN, LEFT UPPER QUADRANT (ICD-789.02)    MIGRAINE HEADACHE (ICD-346.90)    ENDOMETRIOSIS (ICD-617.9)    OVARIAN CYST (ICD-620.2)    PEPTIC ULCER DISEASE (ICD-533.90)    HIATAL HERNIA (ICD-553.3)    DYSLIPIDEMIA (ICD-272.4)    HYPERLIPIDEMIA (ICD-272.2)    HYPERTENSION (ICD-401.9)    CHEST PAIN, ATYPICAL (ICD-786.59)    Constipation           Family History:    Mother is age 66 with hypertension.  Father is 13 and also has hypertension, diabetes, noted to have prostate cancer.  She has a grandmother and sister that are diabetic.       Social History:    Married    Has 2 grown children.  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     Former Smoker Quit tobacco in 1996.  She was a pack a day smoker for approximately 10 years.     Alcohol use-yes   Risk Factors:  Tobacco use:  quit Alcohol use:  yes   Review of Systems       She is still  struggling with LLQ abd pain with GERD symptoms.   Physical Exam  General:     alert and overweight-appearing.   Head:     normocephalic and atraumatic.   Mouth:     Oral mucosa and oropharynx without lesions or exudates.  Neck:     supple and no masses.   Lungs:     normal respiratory effort and normal breath sounds.   Heart:     normal rate, regular rhythm, and no gallop.   Abdomen:     soft and mild LLQ and RLQ tenderness. Extremities:     No lower extremity edema  Neurologic:     alert & oriented X3 and cranial nerves II-XII intact.   Skin:     Diffuse papular rash - chronic Psych:     normally interactive and good eye contact.      Impression & Recommendations:  Problem # 1:  HYPERTENSION (ICD-401.9) Pt with poor compliance.  She reports Azor is too expensive.  Pt has hx of ACE allergy.  Change Azor to Benicar and amlodipine.  Samples of Benicar provided.  The following medications were removed from the medication list:    Azor 5-20 Mg Tabs (Amlodipine-olmesartan) ..... One by mouth once daily  Her updated medication list for this problem includes:    Amlodipine Besylate 5 Mg Tabs (Amlodipine besylate) ..... One by mouth once daily    Benicar 20 Mg Tabs (Olmesartan medoxomil) ..... One by mouth qd   Problem # 2:  GERD (ICD-530.81) Change Nexium to omeprazole due to costs.  Pt with persistent abd pain of unclear etiology.  Obtain CT of Abd and pelvis.  Her updated medication list for this problem includes:    Prilosec 20 Mg Cpdr (Omeprazole) ..... One by mouth two times a day before meals    Carafate 1 Gm Tabs (Sucralfate) ..... One by mouth four times ac and at bedtime   Problem # 3:  DIABETES MELLITUS, TYPE II (ICD-250.00) Monitor A1c.  Pt not taking Actos on a regular basis due to cost.  I urged dietary and medication compliance.  Monitor A1c.  Her updated medication list for this problem includes:    Actos 30 Mg Tabs (Pioglitazone hcl) ..... By mouth  once daily    Benicar 20 Mg Tabs (Olmesartan medoxomil) ..... One by mouth qd  Labs Reviewed: HgBA1c: 6.5 (04/10/2007)   Creat: 0.8 (04/10/2007)      Complete Medication List: 1)  Actos 30 Mg Tabs (Pioglitazone hcl) .... By mouth once daily 2)  Prilosec 20 Mg Cpdr (Omeprazole) .... One by mouth two times a day before meals 3)  Caltrate 600+d 600-400 Mg-unit Tabs (Calcium carbonate-vitamin d) .... By mouth two times a day 4)  Crestor 20 Mg Tabs (Rosuvastatin calcium) .... One by mouth once daily 5)  Zyrtec Allergy 10 Mg Tabs (Cetirizine hcl) .... Take 1 tablet by mouth at bedtime 6)  Carafate 1 Gm Tabs (Sucralfate) .... One by mouth four times ac and at bedtime 7)  Oxycodone-acetaminophen 5-325 Mg Tabs (Oxycodone-acetaminophen) .Marland Kitchen.. 1-2 by mouth q6hr as needed 8)  Amlodipine Besylate 5 Mg Tabs (Amlodipine besylate) .... One by mouth once daily 9)  Benicar 20 Mg Tabs (Olmesartan medoxomil) .... One by mouth qd 10)  Promethazine Hcl 12.5 Mg Tabs (Promethazine hcl) .... One by mouth q12 hrs as needed nausea  Other Orders: Radiology Referral (Radiology)   Patient Instructions: 1)  Please schedule a follow-up appointment in 6 weeks. 2)  BMP prior to visit, ICD-9:  401.9 3)  AST, ALT prior to visit, ICD-9: 272.4 4)  Lipid Panel prior to visit, ICD-9: 272.4 5)  TSH prior to visit, ICD-9: 272.4 6)  HbgA1C prior to visit, ICD-9: 250.00 7)  Urine Microalbumin prior to visit, ICD-9:  250.00 8)  H. Pylori antibody:   530.81 9)  Please return for lab work one (1) week before your next appointment.    Prescriptions: PROMETHAZINE HCL 12.5 MG  TABS (PROMETHAZINE HCL) one by mouth q12 hrs as needed nausea  #28 x 0   Entered and Authorized by:   D. Thomos Lemons DO   Signed by:   D. Thomos Lemons DO on 10/31/2007   Method used:   Electronically sent to ...       [facility]  [address] [id number]       [address]       [place], [place]  [address]  Ph: [phone] or  [phone]       Fax: [phone]   RxID:   [id number] PRILOSEC 20 MG  CPDR (OMEPRAZOLE) one by mouth two times a day before meals  #60 x 5   Entered and Authorized by:   D. Thomos Lemons DO   Signed by:   D. Thomos Lemons DO on 10/31/2007   Method used:   Electronically sent to ...       [facility]  [address] [id number]       [address]       [place], [place]  [address]       Ph: [phone] or [phone]       Fax: [phone]   RxID:   [id number] BENICAR 20 MG  TABS (OLMESARTAN MEDOXOMIL) one by mouth qd  #30 x 5   Entered and Authorized by:   D. Thomos Lemons DO   Signed by:   D. Thomos Lemons DO on 10/31/2007   Method used:   Electronically sent to ...       [facility]  [address] [id number]       [address]       [place], [place]  [address]       Ph: [phone] or [phone]       Fax: [phone]   RxID:   [id number] AMLODIPINE BESYLATE 5 MG  TABS (AMLODIPINE BESYLATE) one by mouth once daily  #30 x 5   Entered and Authorized by:   D. Thomos Lemons DO   Signed by:   D. Thomos Lemons DO on 10/31/2007   Method used:   Electronically sent to ...       [facility]  [address] [id number]       [address]       [place], [place]  [address]       Ph: [phone] or [phone]       Fax: [phone]   RxID:   [id number]  ] Current Allergies (reviewed today): ! ACE INHIBITORS

## 2010-07-27 NOTE — Progress Notes (Signed)
  Phone Note Other Incoming   Request: Send information Summary of Call: Request for records received from [facility]. Request forwarded to [facility].     

## 2010-07-27 NOTE — Progress Notes (Signed)
Summary: stat lab results  Phone Note Other Incoming   Caller: Delaney Meigs @ [facility]  [phone] Summary of Call: Delaney Meigs calling from [place] with stat Lab results: BMP liver normal. Lipase 10 normal. CBC - WBC 10.3 normal, RBC 5.06 normal, Hgb 11.1 low, HCT 35.5 low, MCV 70.2 low, MCH 21.9 low, MCHC 31.3 normal, RDW 17.4 high, Platelet 212 normal. Labs reported to Dr Milinda Antis 5:43 pm.  Mervin Kung CMA  Nov 02, 2009 8:11 AM   Follow-up for Phone Call        call pt - no acute findings found on recent blood test Follow-up by: D. Thomos Lemons DO,  Nov 02, 2009 8:18 AM  Additional Follow-up for Phone Call Additional follow up Details #1::        Left message on machine to return my call.  Mervin Kung CMA  Nov 02, 2009 10:49 AM   Unable to leave message as home number is now disconnected.  Letter mailed to pt. with results.  Mervin Kung CMA  Nov 07, 2009 11:07 AM

## 2010-07-27 NOTE — Progress Notes (Signed)
Summary: TRIAGE--blood in stool   Phone Note Call from Patient Call back at [phone]   Caller: Patient Call For: Dr. Arlyce Dice Reason for Call: Talk to Nurse Summary of Call: pt reporting that she is passing some blood when she has a BM and wants to talk to [facility] about it Initial call taken by: Vallarie Mare,  May 25, 2009 3:33 PM  Follow-up for Phone Call        Pt. c/o lower abd. pain, getting worse, nausea, BRB with each BM x3 today. Symptoms began today.  Denies fever, diarrhea, constipation.  1) See Amy Esterwood PAC on 05-26-09 at 10am. Have labs drawn prior. 2) Full liquids for 24 hours 3) tylenol/Ibuprofen as needed 4) If symptoms become worse call back immediately or go to ER.   Follow-up by: Laureen Ochs LPN,  May 25, 2009 3:48 PM  Additional Follow-up for Phone Call Additional follow up Details #1::        ok Additional Follow-up by: Louis Meckel MD,  May 26, 2009 1:38 PM

## 2010-07-27 NOTE — Progress Notes (Signed)
Summary: Triage-Constipation   Phone Note Call from Patient Call back at [facility] Phone [phone]   Caller: Patient Call For: Dr. Arlyce Dice Reason for Call: Talk to Nurse Summary of Call: Pt. is constipated and has not had a BM in almost a week. Went to ER and was told to get citrus magnesium and nothing is helping Initial call taken by: Karna Christmas,  August 08, 2009 12:46 PM  Follow-up for Phone Call        Pt. saw Dr.Yoo on 08-03-09 for constipation.  Has been using Lactulose QID and tried Amitizia  two times a day for 5 days, no BM.  Went to [facility] ER on 08-03-09, they gave her Mag.Citrate, no BM. No BM in 1 week. Pt. is very bloated, nausea.   1) Milk of magnesia now. Glass of warm prune juice at bedtime.If no BM by the morning, then repeat dose of MOM. 2) Continue Lactulose as directed 3) See Dr.Tiffiany Beadles on 08-12-09 at 2:30pm 4) If symptoms become worse call back immediately or go to ER.   Follow-up by: Laureen Ochs LPN,  August 08, 2009 2:33 PM  Additional Follow-up for Phone Call Additional follow up Details #1::        If no bm try miralax 38gm in 32 oz water Additional Follow-up by: Louis Meckel MD,  August 09, 2009 8:47 AM    Additional Follow-up for Phone Call Additional follow up Details #2::    Message left for patient to callback. Laureen Ochs LPN  August 09, 2009 8:48 AM   Pt. states she had a BM this morning. She will continue her meds.,  keep scheduled office visit 08-12-09 and callback as needed.  Follow-up by: Laureen Ochs LPN,  August 09, 2009 11:54 AM

## 2010-07-27 NOTE — Procedures (Signed)
Summary: Upper Endoscopy  Patient: [name] Note: All result statuses are Final unless otherwise noted.  Tests: (1) Upper Endoscopy (EGD)   EGD Upper Endoscopy       DONE     [facility]     [address]     [place], [place]  [address]           ENDOSCOPY PROCEDURE REPORT           PATIENT:  [name]  MR#:  [id number]     BIRTHDATE:  01/15/1961, 48 yrs. old  GENDER:  female           ENDOSCOPIST:  Barbette Hair. Arlyce Dice, MD     Referred by:           PROCEDURE DATE:  05/04/2009     PROCEDURE:  EGD with biopsy, EGD with enteroscopy     ASA CLASS:  Class II     INDICATIONS:  iron deficiency anemia           MEDICATIONS:   Fentanyl 75 mcg IV, Versed 8 mg IV, Benadryl 50 mg     IV, glycopyrrolate (Robinal) 0.2 mg IV, 0.6cc simethancone 0.6 cc     PO     TOPICAL ANESTHETIC:  Exactacain Spray           DESCRIPTION OF PROCEDURE:   After the risks benefits and     alternatives of the procedure were thoroughly explained, informed     consent was obtained.  The LB GIF-H180 [id number] endoscope was     introduced through the mouth and advanced to the proximal jejunum,     without limitations.  The instrument was slowly withdrawn as the     mucosa was fully examined.     <<PROCEDUREIMAGES>>           Inflammation was found. There were patchy areas of erythema,     possible submucosal hemorrhage (questionable scope trauma) (see     image5 and image6). Bxs taken  Mild gastritis was found in the     body of the stomach (see image1).  Otherwise the examination was     normal.    Retroflexed views revealed no abnormalities.    The     scope was then withdrawn from the patient and the procedure     completed.           COMPLICATIONS:  None           ENDOSCOPIC IMPRESSION:     1) Questionable patchy Inflammation of the small bowel     2) Mild gastritis in the body of the stomach     3) Otherwise normal examination     RECOMMENDATIONS:     1) await biopsy results     2)  Call office next 2-3 days to schedule an office appointment     for 2 weeks     3) labs - CBC, Hg electrophoresis, celiac panel           REPEAT EXAM:  No           ______________________________     Barbette Hair. Arlyce Dice, MD           CC:  Thomos Lemons, DO           n.     eSIGNED:   Barbette Hair. Kaplan at 05/04/2009 03:45 PM           [name], [id number]  Note: An exclamation  mark (!) indicates a result that was not dispersed into the flowsheet. Document Creation Date: 05/04/2009 3:46 PM _______________________________________________________________________  (1) Order result status: Final Collection or observation date-time: 05/04/2009 15:38 Requested date-time:  Receipt date-time:  Reported date-time:  Referring Physician:   Ordering Physician: Melvia Heaps [phone]) Specimen Source:  Source: Launa Grill Order Number: [id number] Lab site:   Appended Document: Upper Endoscopy    Clinical Lists Changes  Orders: Added new Test order of TLB-CBC Platelet - w/Differential ([id number]) - Signed Added new Test order of T-Sprue Panel (Celiac Disease Aby Eval) (83516x3/[id number]) - Signed Added new Test order of TLB-IgA (Immunoglobulin A) ([id number]-[id number]) - Signed Added new Test order of T-Serum Protein Electrophoresis ([id number]) - Signed

## 2010-07-27 NOTE — Progress Notes (Signed)
Summary: Migraine headache  Phone Note Call from Patient Call back at [phone]   Caller: Patient Call For: D. Thomos Lemons DO Summary of Call: patient called and left voice message stating she has had headaches for the past 2 days and was not sure if she needed to see Dr Artist Pais or be evaulated in the emergency room. Initial call taken by: Glendell Docker CMA,  July 11, 2010 10:26 AM  Follow-up for Phone Call        call was returned patient, she states that she has nasuea and vomiting, has taken hydrocodone from when she had he teeth pulled, but that has not helped. She states she has bad migraine and would  like to know what Dr Artist Pais advises Follow-up by: Glendell Docker CMA,  July 11, 2010 10:28 AM  Additional Follow-up for Phone Call Additional follow up Details #1::        have her come in to office we can give her phenergen IM which should help headache we can also discuss referral to headache specialist has she tried taking ibuprofen or other OTC analgesic Additional Follow-up by: D. Thomos Lemons DO,  July 11, 2010 1:20 PM    Additional Follow-up for Phone Call Additional follow up Details #2::    call returned to patient at [phone], she has been advised per Dr Artist Pais instructions. She states that she has seen a headache specialist, and taken otc medication, with no  improvement. She has scheduled to see Dr Artist Pais at 3 pm today 07/11/2009 Follow-up by: Glendell Docker CMA,  July 11, 2010 2:08 PM

## 2010-07-27 NOTE — Assessment & Plan Note (Signed)
Summary: 3 DAY FU/ ASKED TO COME AT 11:15 /NWS   Vital Signs:  Patient Profile:   50 Years Old Female Height:     64.5 inches Temp:     97.9 degrees F oral Pulse rate:   71 / minute BP sitting:   134 / 82  (left arm)  Vitals Entered By: Glendell Docker (May 08, 2007 11:35 AM)                 Chief Complaint:  FOLLOW UP APPT FROM 11/11PT STATES SHE IS "STILL THE SAME" THERE IS NO IMPROVEMNT IN HER SYMPTOMS. PT IS AWARE OF APPT WITH DR Arlyce Dice ON 11/17 @ 1:30P.  History of Present Illness: 50 year old African American female with refractory atypical chest pain.  She was sent for  CT of Chest with following results:    IMPRESSION:  1.  No acute findings. 2.  Hiatal hernia with air seen within the mid and distal esophagus,  which may be secondary to gastroesophageal reflux or distal  esophageal stricture.  Barium esophagram or endoscopy may be helpful  in further evaluation of this. 3.  4.8 cm in size right lobe hepatic lesion most consistent with a  stable cavernous hemangioma.   She has history of esophageal stricture.  Her symptoms are constant.  She rates discomfort as 5 out of 10.  Current Allergies (reviewed today): ! ACE INHIBITORS  Past Medical History:    Hypertension    GERD    Gastroparesis    History of H.Pylori    Microsocpic Anemia    Left knee Chondromalacia    Diabetes Type II    Esophageal Stricture S/P dilation     Review of Systems      See HPI   Physical Exam  General:     Well-developed,well-nourished,in no acute distress; alert,appropriate and cooperative throughout examination Lungs:     Normal respiratory effort, chest expands symmetrically. Lungs are clear to auscultation, no crackles or wheezes. Heart:     Normal rate and regular rhythm. S1 and S2 normal without gallop, murmur, click, rub or other extra sounds.    Impression & Recommendations:  Problem # 1:  CHEST PAIN, ATYPICAL (ICD-786.59) I reviewed recent CT of chest.   No dissection.  I suspect symptoms are due to esophageal stricture.  Her last EGD with dilation was 09/12/06. I advised mechanical soft diet until seen by Dr. Arlyce Dice 05/12/07.  Recent cardiac stress test neg.   *  Hiatal hernia with air seen within the mid and distal esophagus,  which may be secondary to gastroesophageal reflux or distal  esophageal stricture.  Barium esophagram or endoscopy may be helpful  in further evaluation of this.   Problem # 2:  HYPERTENSION (ICD-401.9) Stable. Her updated medication list for this problem includes:    Azor 10-20 Mg Tabs (Amlodipine-olmesartan) ..... One by mouth once daily  BP today: 134/82 Prior BP: 170/91 (05/06/2007)  Labs Reviewed: Creat: 0.8 (04/10/2007) Chol: 265 (04/10/2007)   HDL: 47.5 (04/10/2007)   LDL: DEL (04/10/2007)   TG: 65 (04/10/2007)   Complete Medication List: 1)  Actos 30 Mg Tabs (Pioglitazone hcl) .... By mouth once daily 2)  Nexium 40 Mg Pack (Esomeprazole magnesium) .... One by mouth once daily 3)  Caltrate 600+d 600-400 Mg-unit Tabs (Calcium carbonate-vitamin d) .... By mouth two times a day 4)  Crestor 20 Mg Tabs (Rosuvastatin calcium) .... One by mouth once daily 5)  Zyrtec Allergy 10 Mg Tabs (Cetirizine hcl) .Marland KitchenMarland KitchenMarland Kitchen  Take 1 tablet by mouth at bedtime 6)  Carafate 1 Gm Tabs (Sucralfate) .... One by mouth four times ac and at bedtime 7)  Oxycodone-acetaminophen 5-325 Mg Tabs (Oxycodone-acetaminophen) .Marland Kitchen.. 1-2 by mouth q6hr as needed 8)  Azor 10-20 Mg Tabs (Amlodipine-olmesartan) .... One by mouth once daily     ]

## 2010-07-27 NOTE — Letter (Signed)
Summary: Primary Care Consult Scheduled Letter  [place] [facility]  [address] [place], [place] [address]   Phone: [phone]  Fax: [phone]      01/23/2008 MRN: [id number]  [facility] [address] [place], [place]  [address]    Dear Ms. Scherman,      We have scheduled an appointment for you.  At the recommendation of Dr.___Robert Yoo___________________, we have scheduled you a consult with [facility] ___Dr Alhan_________________ on __August 6________________ at ___4:30pm___.  Their address is__[address]_______________________. The office phone number is [phone]________.  If this appointment day and time is not convenient for you, please feel free to call the office of the doctor you are being referred to at the number listed above and reschedule the appointment.     It is important for you to keep your scheduled appointments. We are here to make sure you are given good patient care. If you have questions or you have made changes to your appointment, please notify us at  [phone]         , ask for [facility]             .    Thank you,  Patient Care Coordinator [place] [facility]

## 2010-07-27 NOTE — Progress Notes (Signed)
Summary: Joint and Breast Pain  Phone Note Call from Patient Call back at Home Phone [phone] Call back at [phone]   Caller: Patient Summary of Call: Patient called  stating she is having joint aches , and breast pain for the past week, she states she took Tylenol and Advil with no improvement. She was advised that office visit was needed. She ask to check to see what Dr Artist Pais advises. She is scheduled for follow up on January 9th. Initial call taken by: Glendell Docker CMA,  May 24, 2010 4:24 PM  Follow-up for Phone Call        try taking vitamin E 800 International Units x 1 week Avoid caffeine if persistent breast pain - needs OV Follow-up by: D. Thomos Lemons DO,  May 25, 2010 2:36 PM  Additional Follow-up for Phone Call Additional follow up Details #1::        call returned to patient at [phone], no answer. A detailed voice message was left informing patient per Dr Artist Pais instructions. Message was left for patient to call if any questions Additional Follow-up by: Glendell Docker CMA,  May 25, 2010 2:47 PM

## 2010-07-27 NOTE — Assessment & Plan Note (Signed)
Summary: 3 month follow up/mhf   Vital Signs:  Patient profile:   50 year old female Height:      64 inches Weight:      222.25 pounds BMI:     38.29 O2 Sat:      100 % on Room air Temp:     98.2 degrees F oral Pulse rate:   68 / minute Pulse rhythm:   regular Resp:     16 per minute BP sitting:   126 / 90  (right arm) Cuff size:   large  Vitals Entered By: Glendell Docker CMA (March 01, 2010 8:54 AM)  O2 Flow:  Room air CC: 3 Month follow up  Is Patient Diabetic? No Pain Assessment Patient in pain? no      Comments c/o joint pain,  constant in right hip, unresolved stomach problems, c/o bilateral ear pain for the past week, Reglan  is not helping    Primary Care Indiya Izquierdo:  Dondra Spry DO  CC:  3 Month follow up .  History of Present Illness: 50 y/o for AA female  pt c/o bloated after eating also occurs with drinking seen by Dr. Arlyce Dice - she had gastric emptying study - normal  breakfast - cereal  ( cheerios ) with milk-  occ smoothy (strawberry banana) - contains yogurt lunch - [place] sandwich with cheese,  once a week hamburger,  occ eats fries usually drinks [employer] dinner - usually cooks dinner,  occ tuna,  baked chicken  lives with husband and daughter  c/o intermit   Preventive Screening-Counseling & Management  Alcohol-Tobacco     Smoking Status: quit < 6 months  Allergies: 1)  Ace Inhibitors  Past History:  Past Medical History: CHEST PAIN, ATYPICAL (ICD-786.59) HYPERLIPIDEMIA (ICD-272.2)  HYPERTENSION (ICD-401.9)   GERD (ICD-530.81)  DIABETES MELLITUS, TYPE II BORDERLINE (ICD-790.29)   CONSTIPATION (ICD-564.00) GASTROPARESIS (ICD-536.3)   ANEMIA-IRON DEFICIENCY (ICD-280.9)    ESOPHAGEAL STRICTURE (ICD-530.3)  ALLERGIC RHINITIS (ICD-477.9)   MIGRAINE HEADACHE (ICD-346.90) ENDOMETRIOSIS (ICD-617.9) OVARIAN CYST (ICD-620.2) PEPTIC ULCER DISEASE (ICD-533.90)    Non compliance  Past Surgical History: Exploratory Laparotomy  w/ BSO (2003)  Lt. Knee (2005)      Hysterectomy   Cholecystectomy  Cardiac cath 2009 - mild non obstructive CAD  Social History: Smoking Status:  quit < 6 months  Physical Exam  General:  alert, well-developed, and well-nourished.   Lungs:  normal respiratory effort and normal breath sounds.   Heart:  normal rate, regular rhythm, and no gallop.   Abdomen:  soft.  mild epigastric tenderness,  no masses   Impression & Recommendations:  Problem # 1:  ABDOMINAL BLOATING (ICD-787.3) 50 y/o AA female with persistent abd bloating gastric emptying normal prev EGD - non specific  I suggest dietary changes - eliminate daily products, soft drinks and reduce intake of meat use probiotic  Problem # 2:  HYPERTENSION (ICD-401.9) Assessment: Improved  Her updated medication list for this problem includes:    Diovan Hct 160-12.5 Mg Tabs (Valsartan-hydrochlorothiazide) .Marland Kitchen... Take 1 tablet by mouth once a day  BP today: 126/90 Prior BP: 142/80 (11/23/2009)  Labs Reviewed: K+: 3.1 (11/22/2009) Creat: : 1.0 (11/22/2009)   Chol: 206 (05/06/2009)   HDL: 48.70 (05/06/2009)   LDL: DEL (02/12/2008)   TG: 93.0 (05/06/2009)  Problem # 3:  HIP PAIN, RIGHT (ICD-719.45)  discomfort / tenderness of right trochanteric bursa.   she reports prev x rays taken by ortho normal I suspect hip bursitis.  pt  advised to f/u with ortho for cortisone injection  Complete Medication List: 1)  Diovan Hct 160-12.5 Mg Tabs (Valsartan-hydrochlorothiazide) .... Take 1 tablet by mouth once a day 2)  Ondansetron Hcl 4 Mg Tabs (Ondansetron hcl) .... One by mouth two times a day as needed for nausea 3)  Klor-con M20 20 Meq Cr-tabs (Potassium chloride crys cr) .... One by mouth once daily 4)  Reglan 10 Mg Tabs (Metoclopramide hcl) .... Take 1 30 minutes before breakfast, lunch, dinner and at bedtime. 5)  Nexium 40 Mg Cpdr (Esomeprazole magnesium) .Marland Kitchen.. 1 capsule each day 30 minutes before meal  Other Orders: Mammogram  (Screening) (Mammo)  Patient Instructions: 1)  No dairy foods 2)  No red meat 3)  No soft drinks 4)  Take align over the counter - take once daily 5)  Please schedule a follow-up appointment in 1 month. 6)  Follow up with your orthopedic doctor re:  right hip pain      Current Allergies (reviewed today): ACE INHIBITORS  Appended Document: Orders Update    Clinical Lists Changes  Orders: Added new Service order of Influenza Vaccine NON MCR ([address]) - Signed Added new Service order of Flu Vaccine 7yrs + MEDICARE PATIENTS ([id number]) - Signed Observations: Added new observation of FLU VAX#1VIS: 01/16/07 version given March 01, 2010. (03/01/2010 9:37) Added new observation of FLU VAXLOT: [id number] (03/01/2010 9:37) Added new observation of FLU VAX EXP: 12/23/2010 (03/01/2010 9:37) Added new observation of FLU VAXBY: Darlene Knight CMA (03/01/2010 9:37) Added new observation of FLU VAXRTE: IM (03/01/2010 9:37) Added new observation of FLU VAX DSE: 0.5 ml (03/01/2010 9:37) Added new observation of FLU VAXMFR: GlaxoSmithKline (03/01/2010 9:37) Added new observation of FLU VAX SITE: left deltoid (03/01/2010 9:37) Added new observation of FLU VAX: Fluvax Non-MCR (03/01/2010 9:37)        Immunizations Administered:  Influenza Vaccine # 1:    Vaccine Type: Fluvax Non-MCR    Site: left deltoid    Mfr: GlaxoSmithKline    Dose: 0.5 ml    Route: IM    Given by: Glendell Docker CMA    Exp. Date: 12/23/2010    Lot #: [id number]    VIS given: 01/16/07 version given March 01, 2010.  Flu Vaccine Consent Questions:    Do you have a history of severe allergic reactions to this vaccine? no    Any prior history of allergic reactions to egg and/or gelatin? no    Do you have a sensitivity to the preservative Thimersol? no    Do you have a past history of Guillan-Barre Syndrome? no    Do you currently have an acute febrile illness? no    Have you ever had a severe reaction to  latex? no    Vaccine information given and explained to patient? yes    Are you currently pregnant? no

## 2010-07-27 NOTE — Letter (Signed)
Summary: [facility]   Imported By: Lanelle Bal 07/11/2009 12:35:43  _____________________________________________________________________  External Attachment:    Type:   Image     Comment:   External Document

## 2010-07-27 NOTE — Procedures (Signed)
Summary: Capsule Endoscopy   Capsule Endoscopy  Procedure date:  01/10/2009  Findings:      Performing Location: [facility]   doscopy  Procedure date:  01/10/2009  Findings:      Performing Location: [facility]   Ordering Physician: Melvia Heaps, MD  Report created/read by: Melvia Heaps, MD  Reason for Referral:50 YO FEMALE WITH FE DEFICIENCY ANEMIA,AND HEME POSITIVE STOOL.`   Procedure Information and Findings:1)COMPLETE STUDY,GOOD PREP 2) FEW TINY RED SPOTS,3)NO FINDINGS TO EXPLAIN  FE DEFICIENCY ANEMIA   Summary and Recommendations  This report was created from the original report, which was reviewed and signed by the above listed reading physician.

## 2010-07-27 NOTE — Initial Assessments (Signed)
Summary: POST ERX2    ABD.PAIN           (DR.KAPLAN PT.)   DEBORAH    History of Present Illness Visit Type: Follow-up Visit Primary GI MD: Melvia Heaps MD [facility] Primary Riggin Cuttino: Thomos Lemons, D.O. Chief Complaint: adbominal pain History of Present Illness:   50 YO FEMALE,KNOWN TO DR Arlyce Dice WITH HX OF GERD,ESOPHAGEAL STRICTURE,CONSTIPATION,PREVIOUSLY DOCUMENTED GASTROPARESIS. SHE ALSO HAS A PREVIOUSLY DOCUMENTED RIGHT HEPATIC LOBE HEMANGIOMA,OF AROUND 5 CM.SHE COMES IN TODAY FOR EVALUATION OF ACUTE ABDOMINAL PAIN X 5-6 DAYS,PROGRESSIVE TO THE POINT SHE WENT TO THE ER YESTERDAY. SHE DESCRIBES THE PAIN AS UPER ABDOMEN,MOSTLY RIGHT SIDED WITH SOME RADIATION INTO HER BACK.THIS WAS ASSOCIATED WITH N/V AND SOME DIARRHEA YESTERDAY AND SWEATS.NO MELENA OR HEMATOCHEZIA. HER PAI IS SHARP AND SHE RATES IT AN 8/10. LABS IN THE ER WERE UNREMAKABLE EXCEPT WBC11.0,HGB11.8,MCV70.1. CT ABDOMEN/PELVIS WAS NEGATIVE EXCEPT FOR A 6.6 X5.7 CM RIGHT HEPATIC LOBE HEMANGIOMA. SHE DOES NOT FEEL ANY BETTER TODAY,UNABLE TO EAT,ASKING FOR ADMIT BECAUSE SHE HURTS SO BADLY.   GI Review of Systems    Reports abdominal pain, bloating, nausea, and  vomiting.     Location of  Abdominal pain: upper abdomen.    Denies acid reflux, belching, chest pain, dysphagia with liquids, dysphagia with solids, heartburn, loss of appetite, vomiting blood, weight loss, and  weight gain.        Denies anal fissure, black tarry stools, change in bowel habit, constipation, diarrhea, diverticulosis, fecal incontinence, heme positive stool, hemorrhoids, irritable bowel syndrome, jaundice, light color stool, liver problems, rectal bleeding, and  rectal pain.    Current Medications (verified): 1)  Crestor 20 Mg  Tabs (Rosuvastatin Calcium) .... One By Mouth Once Daily 2)  Amlodipine Besylate 10 Mg  Tabs (Amlodipine Besylate) .... One By Mouth Once Daily 3)  Benicar 20 Mg  Tabs (Olmesartan Medoxomil) .... One By Mouth Qd 4)  Clinoril 200 Mg Tabs  (Sulindac) .... One Tab B.i.d. For 5 Days Then P.r.n.  Allergies (verified): 1)  ! Ace Inhibitors  Past History:  Past Medical History:    Hypertension    GERD    Gastroparesis    History of H.Pylori    Microsocpic Anemia    Left knee Chondromalacia    Diabetes Type II    Esophageal Stricture S/P dilation    Medical non compliance    ABDOMINAL PAIN, LEFT UPPER QUADRANT (ICD-789.02)     MIGRAINE HEADACHE (ICD-346.90)    ENDOMETRIOSIS (ICD-617.9)    OVARIAN CYST (ICD-620.2)    PEPTIC ULCER DISEASE (ICD-533.90)    HIATAL HERNIA (ICD-553.3)    DYSLIPIDEMIA (ICD-272.4)    HYPERLIPIDEMIA (ICD-272.2)    HYPERTENSION (ICD-401.9)    CHEST PAIN, ATYPICAL (ICD-786.59)    CHRONIC CONSTIPATION,NEGATIVE COLON3/06    IRON DEFICIENCY ANEMIA    RIGHT HEPATIC LOBE HEMANGIOMA  Past Surgical History:    Exploratory Laparotomy w/ BSO (2003)    Lt. Knee (2005)     Hysterectomy  Family History:    Reviewed history from 12/02/2007 and no changes required:       Mother is age 61 with hypertension.  Father is 64 and also has hypertension, diabetes, noted to have prostate cancer.  She has a grandmother and sister that are diabetic.               No FH of Colon Cancer:  Social History:    Reviewed history from 01/20/2008 and no changes required:       Married  Has 2 grown children.  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        Former Smoker Quit tobacco in 1996.  She was a pack a day smoker for approximately 10 years.        Alcohol use-yes       Daily Caffeine Use          Review of Systems       The patient complains of arthritis/joint pain, back pain, heart murmur, muscle pains/cramps, night sweats, sleeping problems, thirst - excessive, and urination - excessive.         ROS REVIWED AND NEGATIVE EXCEPT AS ABOVE AND IN HPI.  Vital Signs:  Patient profile:   50 year old female Height:      64.5 inches Weight:      225.13 pounds BMI:     38.18 Pulse rate:   72 / minute Pulse  rhythm:   regular BP sitting:   140 / 84  (right arm)  Vitals Entered By: June McMurray CMA (Oct 25, 2008 1:51 PM)  Physical Exam  General:  Well developed, well nourished, no acute distress. Head:  Normocephalic and atraumatic. Eyes:  PERRLA, no icterus. Neck:  Supple; no masses or thyromegaly. Lungs:  Clear throughout to auscultation. Heart:  Regular rate and rhythm; no murmurs, rubs,  or bruits. Abdomen:  SOFT,TENDER RUQ,RMQ,NO GUARDING,NO REBOUND,NO PALP MASS OR HSM,BS+,NO RASH Rectal:  HEME NEGATIVE Msk:  Symmetrical with no gross deformities. Normal posture. Neurologic:  Alert and  oriented x4;  grossly normal neurologically. Psych:  Alert and cooperative. Normal mood and affect.   Impression & Recommendations:  Problem # 1:  ABDOMINAL PAIN-RUQ (ICD-789.01) Assessment New 50 YO FEMALE WITH 5-6 DAY HX OF PROGRESSIVE EPIGASTRIC AND RUQ ABDOMINAL PAIN,CT POSITIVE FOR LARGE RIGHT HEPATIC LOBE HEMANGIOMA,UNCLEAR WHETHER THIS IS CAUSE OF HER PAIN,NO EVIDENCE FOR BLEED ON CT,R/O GASTROENTERITIS,R/O PUD   ADMIT TO OBSERVATION FOR PAIN CONTROL,BOWEL REST SERIAL H/HS two times a day PPI UPPER ABDOMIANL US SCHEDULE FOR EGD WITH DR Russella Dar IN AM CONSIDER MR IOF LIVER  Problem # 2:  ANEMIA-IRON DEFICIENCY (ICD-280.9) Assessment: Deteriorated MULTIPLE BENIGN DUODENAL NODULES ON PRIOR EGD? SOURCE FOR BLOOD LOSS REPEAT IRON STUDIES RESTART IRON RX  Problem # 3:  ESOPHAGEAL STRICTURE (ICD-530.3) Assessment: Comment Only  Problem # 5:  DYSLIPIDEMIA (ICD-272.4) Assessment: Comment Only  Problem # 6:  HYPERTENSION (ICD-401.9) Assessment: Comment Only

## 2010-07-27 NOTE — Consult Note (Signed)
Summary: [facility]   Imported By: Esmeralda Links D'jimraou 07/18/2007 15:19:30  _____________________________________________________________________  External Attachment:    Type:   Image     Comment:   External Document

## 2010-07-27 NOTE — Op Note (Signed)
[facility]   Imported By: Esmeralda Links D'jimraou 08/13/2007 12:43:31  _____________________________________________________________________  External Attachment:    Type:   Image     Comment:   External Document

## 2010-07-27 NOTE — Assessment & Plan Note (Signed)
Summary: RECTAL BLEEDING, LOWER ABD. PAIN     (DR.KAPLAN PT)    [name]    History of Present Illness Visit Type: Follow-up Visit Primary GI MD: Melvia Heaps MD [facility] Primary Provider: Thomos Lemons, MD Chief Complaint: rectal bleeding lower abdominal pain History of Present Illness:   50 YO FEMALE KNOWN TO DR Arlyce Dice WITH HX OF IRON DEFICIENCY ANEMIA WITH NEGATIVE WORKUP,HX OF GASTROPARESIS,CHRONIC GERD,DM. SHE JUST HAD A RECENT EGD 05/04/09 WHICH SHOWED MILD GASTRITIS ,SHE WAS ALSO BX'ED TO R/O CELIAC DISEASE-BX NEGATIVE. SHE HAD A NORMAL COLONOSCOPY IN 2006 SHE COMES IN TODAY WITH C/O  3 DAYS OF CRAMPY LOWER ABDOMINAL PAIN,ONSET 11/28,ASSOCIATED WITH NAUSEA,AND BLOODY STOOLS. SHE HAD 3-4 EPISODES SUNDAY,2-3 YESTERDAY AND ONE TODAY. NO FEVER. SHE FEELS TIRED  /WASHED OUT.SHE HAD BEEN TRIED ON REGLAN IN 9/10 BUT DID NOT SEEM TO HELP GASTROPARETIC SXS SO SHE STOPPED IT.   GI Review of Systems    Reports abdominal pain, bloating, loss of appetite, and  nausea.     Location of  Abdominal pain: lower abdomen.    Denies acid reflux, belching, chest pain, dysphagia with liquids, dysphagia with solids, heartburn, vomiting, vomiting blood, and  weight loss.      Reports change in bowel habits, diarrhea, and  rectal bleeding.     Denies anal fissure, black tarry stools, constipation, diverticulosis, fecal incontinence, heme positive stool, hemorrhoids, irritable bowel syndrome, jaundice, light color stool, liver problems, and  rectal pain.    Current Medications (verified): 1)  Nu-Iron 150 Mg Caps (Polysaccharide Iron Complex) .... Take 1 Capsule By Mouth Two Times A Day 2)  Hydrochlorothiazide 25 Mg Tabs (Hydrochlorothiazide) .... Take 1 Tablet By Mouth Once A Day 3)  Lisinopril 20 Mg Tabs (Lisinopril) .... Take 1 Tablet By Mouth Once A Day 4)  Aspirin 81 Mg Tbec (Aspirin) .... Take 1 Tablet By Mouth Once A Day 5)  Crestor 10 Mg Tabs (Rosuvastatin Calcium) .... One By Mouth Once Daily (Hold) 6)   Vitamin D (Ergocalciferol) 50000 Unit Caps (Ergocalciferol) .... One By Mouth Q Weekly 7)  Amitriptyline Hcl 25 Mg Tabs (Amitriptyline Hcl) .... 1/2 To One Tab By Mouth Qhs  Allergies (verified): No Known Drug Allergies  Past History:  Past Medical History: CHEST PAIN, ATYPICAL (ICD-786.59) HYPERLIPIDEMIA (ICD-272.2) HYPERTENSION (ICD-401.9)  GERD (ICD-530.81)  DIABETES MELLITUS, TYPE II BORDERLINE (ICD-790.29)   CONSTIPATION (ICD-564.00) GASTROPARESIS (ICD-536.3) ANEMIA-IRON DEFICIENCY (ICD-280.9)   ESOPHAGEAL STRICTURE (ICD-530.3)  ALLERGIC RHINITIS (ICD-477.9)   MIGRAINE HEADACHE (ICD-346.90) ENDOMETRIOSIS (ICD-617.9) OVARIAN CYST (ICD-620.2) PEPTIC ULCER DISEASE (ICD-533.90)    Non compliance  Past Surgical History: Reviewed history from 05/23/2009 and no changes required. Exploratory Laparotomy w/ BSO (2003)  Lt. Knee (2005)   Hysterectomy Cholecystectomy  Cardiac cath 2009 - mild non obstructive CAD  Family History: Reviewed history from 05/23/2009 and no changes required. Mother is age 91 with hypertension.  Father is 66 and also has hypertension, diabetes, noted to have prostate cancer.  She has a grandmother and sister that are diabetic.  No FH of Colon Cancer: Family History of Kidney Disease:Father      Social History: Reviewed history from 05/23/2009 and no changes required. Married Has 2 grown children.  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  Former Smoker Quit tobacco in 1996.  She was a pack a day smoker for approximately 10 years.  Alcohol use-yes: Social Daily Caffeine Use:6 pack of pepsi daily  Illicit Drug Use - no Patient does not get regular exercise.  Review of Systems       The patient complains of cough and sleeping problems.  The patient denies allergy/sinus, anemia, anxiety-new, arthritis/joint pain, back pain, blood in urine, breast changes/lumps, change in vision, confusion, coughing up blood, depression-new, fainting,  fatigue, fever, headaches-new, hearing problems, heart murmur, heart rhythm changes, itching, muscle pains/cramps, night sweats, nosebleeds, shortness of breath, skin rash, sore throat, swelling of feet/legs, swollen lymph glands, thirst - excessive, urination - excessive, urination changes/pain, urine leakage, vision changes, and voice change.         ROS OTHERWISE AS IN HPI  Vital Signs:  Patient profile:   50 year old female Height:      64 inches Weight:      223.50 pounds BMI:     38.50 Pulse rate:   72 / minute Pulse rhythm:   regular BP sitting:   110 / 78  (left arm)  Vitals Entered By: Milford Cage NCMA (May 26, 2009 9:56 AM)  Physical Exam  General:  Well developed, well nourished, no acute distress. Head:  Normocephalic and atraumatic. Eyes:  PERRLA, no icterus. Lungs:  Clear throughout to auscultation. Heart:  Regular rate and rhythm; no murmurs, rubs,  or bruits. Abdomen:  SOFT,BS+,TENDER LLQ/LMQ,,NO GUARDING,,NO REBOUND Rectal:  BROWN STOOL ,TRACE POSITIVE FOR BLOOD Extremities:  No clubbing, cyanosis, edema or deformities noted. Neurologic:  Alert and  oriented x4;  grossly normal neurologically. Psych:  Alert and cooperative. Normal mood and affect.   Impression & Recommendations:  Problem # 1:  RECTAL BLEEDING (ICD-569.3) Assessment New 50 YO FEMALE WITH 3 DAY HX OF  BLOODY STOOLS,LOWER ABDOMINAL CRAMPY PAIN,AND NAUSEA. PICTURE MOST CONSISTENT WITH A LOW GRADE ISCHEMIC COLITIS/RESOLVING. CBC CHECKED TODAY- WBC 8.2,HGB10.9,HCT34.4  (LAST HGB 11.1 ON 11/10)  FULL LIQUID DIET NEXT 24-48 HOURS ADVANCE AS TOLERATED,HYDRATE BENTYL 10 MG 3 X DAILY BEFORE MEALS FOLLOW UP WITH DR Arlyce Dice IN 2-3 WEEKS. SHE WAS ADVISED OF NATURE OF ISCHEMIC COLITIS,WOULD EXPECT IMPROVEMENT OVER THE NEXT FEW DAYS AND RESOLUTION OF ANY BLEEDING.SHE WILL CALL BACK IF SXS PERSIST BEYOND THE NEXT 3-4 DAYS.  Problem # 2:  HYPERTENSION (ICD-401.9) Assessment: Comment Only  Problem # 3:   GERD (ICD-530.81) Assessment: Comment Only CONTINUE PRILOSEC DAILY   Problem # 4:  GASTROPARESIS (ICD-536.3) Assessment: Comment Only  Problem # 5:  ANEMIA-IRON DEFICIENCY (ICD-280.9) Assessment: Comment Only STABLE,CONTINUE  NU IRON TWICE DAILY  Patient Instructions: 1)  Bentyl 10 mg three times a day before meals as needed cramping #30.  NRFs. 2)  follow-up as needed with Dr. Arlyce Dice. 3)  Full Liquid diet brochure given to patient. 4)  The medication list was reviewed and reconciled.  All changed / newly prescribed medications were explained.  A complete medication list was provided to the patient / caregiver. Prescriptions: BENTYL 20 MG TABS (DICYCLOMINE HCL) take 1 tablet by mouth three times a day as needed cramping  #30 x 0   Entered by:   Lowry Ram NCMA   Authorized by:   Sammuel Cooper PA-c   Signed by:   Lowry Ram NCMA on 05/26/2009   Method used:   Electronically to        [facility] (retail)       [address]       [place], [place]  [address]       Ph: [phone]       Fax: [phone]   RxID:  [id number]  

## 2010-07-27 NOTE — Assessment & Plan Note (Signed)
Summary: 6 Week Follow Up   Vital Signs:  Patient Profile:   50 Years Old Female Height:     64.5 inches Weight:      236.38 pounds BMI:     40.09 Temp:     96.9 degrees F oral Pulse rate:   78 / minute BP sitting:   149 / 89  (right arm)  Vitals Entered By: Glendell Docker (December 08, 2007 8:16 AM)                 PCP:  Thomos Lemons, D.O.  Chief Complaint:  6 Week follow up disease management.  History of Present Illness:  Hypertension Follow-Up      This is a 50 year old woman who presents for Hypertension follow-up.  The patient denies lightheadedness and edema.  The patient denies the following associated symptoms: chest pain.  Compliance with medications (by patient report) has been sporadic.  The patient reports that dietary compliance has been fair.  The patient reports no exercise.    Patient reports unresolved intermittent abdominal discomfort.  She has occasional nausea and vomiting. Dr.  Marvell Fuller notes reviewed.    Patient also complains of increased nasal congestion over last one to two weeks.  She denies fever.  She denies purulent nasal discharge.    Current Allergies (reviewed today): ! ACE INHIBITORS  Past Medical History:    Reviewed history from 12/02/2007 and no changes required:       Hypertension       GERD       Gastroparesis       History of H.Pylori       Microsocpic Anemia       Left knee Chondromalacia       Diabetes Type II       Esophageal Stricture S/P dilation       Medical non compliance       ABDOMINAL PAIN, LEFT UPPER QUADRANT (ICD-789.02)       MIGRAINE HEADACHE (ICD-346.90)       ENDOMETRIOSIS (ICD-617.9)       OVARIAN CYST (ICD-620.2)       PEPTIC ULCER DISEASE (ICD-533.90)       HIATAL HERNIA (ICD-553.3)       DYSLIPIDEMIA (ICD-272.4)       HYPERLIPIDEMIA (ICD-272.2)       HYPERTENSION (ICD-401.9)       CHEST PAIN, ATYPICAL (ICD-786.59)       Constipation                Past Surgical History:    Reviewed history from  08/11/2007 and no changes required:       Partial Hysterctomy (1988)       Exploratory Laparotomy w/ BSO (2003)       Lt. Knee (2005)          Social History:    Reviewed history from 12/02/2007 and no changes required:       Married       Has 2 grown children.  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        Former Smoker Quit tobacco in 1996.  She was a pack a day smoker for approximately 10 years.        Alcohol use-yes       Daily Caffeine Use    Review of Systems      See HPI   Physical Exam  General:     alert and overweight-appearing.   Head:  normocephalic and atraumatic.   Neck:     supple and no masses.   Lungs:     normal respiratory effort and normal breath sounds.   Heart:     normal rate, regular rhythm, and no gallop.   Abdomen:     Soft,   LUQ tendness,soft, no guarding, and no rigidity.   Extremities:     No lower extremity edema     Impression & Recommendations:  Problem # 1:  HYPERTENSION (ICD-401.9) Increase amlodipine to 10 mg daily.  I urged compliance.    Her updated medication list for this problem includes:    Amlodipine Besylate 10 Mg Tabs (Amlodipine besylate) ..... One by mouth once daily    Benicar 20 Mg Tabs (Olmesartan medoxomil) ..... One by mouth qd  BP today: 149/89 Prior BP: 142/80 (12/02/2007)  Labs Reviewed: Creat: 0.8 (04/10/2007) Chol: 265 (04/10/2007)   HDL: 47.5 (04/10/2007)   LDL: DEL (04/10/2007)   TG: 65 (04/10/2007)   Problem # 2:  ALLERGIC RHINITIS (ICD-477.9) Samples of xyzal 5mg  provided and veramyst NS.  Pt advised to call for Rx if symptom improvement.  The following medications were removed from the medication list:    Zyrtec Allergy 10 Mg Tabs (Cetirizine hcl) .Marland Kitchen... Take 1 tablet by mouth at bedtime   Problem # 3:  DIABETES MELLITUS, TYPE II (ICD-250.00) Stable.  Arrange f/u labs before next office visit.  Her updated medication list for this problem includes:    Actos 30 Mg Tabs (Pioglitazone  hcl) ..... By mouth once daily    Benicar 20 Mg Tabs (Olmesartan medoxomil) ..... One by mouth qd  Labs Reviewed: HgBA1c: 6.5 (04/10/2007)   Creat: 0.8 (04/10/2007)      Problem # 4:  DYSLIPIDEMIA (ICD-272.4) Pt tolerating Crestor.  Maintain current medication regimen.  Her updated medication list for this problem includes:    Crestor 20 Mg Tabs (Rosuvastatin calcium) ..... One by mouth once daily  Labs Reviewed: Chol: 265 (04/10/2007)   HDL: 47.5 (04/10/2007)   LDL: DEL (04/10/2007)   TG: 65 (04/10/2007) SGOT: 17 (04/10/2007)   SGPT: 16 (04/10/2007)   Problem # 5:  ANEMIA, NORMOCYTIC (ICD-285.9) I agree she likely has anemia of chronic disease.  Monitor CBCD.  Check retic count.  Problem # 6:  ABDOMINAL PAIN, GENERALIZED, CHRONIC (ICD-789.07) Unclear etiology.  CT of abd and pelvis neg.  I rec f/u with Dr. Arlyce Dice.  Her updated medication list for this problem includes:    Metoclopramide Hcl 10 Mg Tabs (Metoclopramide hcl) .Marland Kitchen... Take 1 tablet by mouth four times a day   Complete Medication List: 1)  Actos 30 Mg Tabs (Pioglitazone hcl) .... By mouth once daily 2)  Prevacid Solutab 30 Mg Tbdp (Lansoprazole) .... Take 1 tablet by mouth once a day 3)  Crestor 20 Mg Tabs (Rosuvastatin calcium) .... One by mouth once daily 4)  Amlodipine Besylate 10 Mg Tabs (Amlodipine besylate) .... One by mouth once daily 5)  Benicar 20 Mg Tabs (Olmesartan medoxomil) .... One by mouth qd 6)  Robinul-forte 2 Mg Tabs (Glycopyrrolate) .... Take 1 tablet by mouth two times a day 7)  Metoclopramide Hcl 10 Mg Tabs (Metoclopramide hcl) .... Take 1 tablet by mouth four times a day   Patient Instructions: 1)  Please schedule a follow-up appointment in 6 weeks. 2)  BMP prior to visit, ICD-9:  401.9 3)  AST, ALT prior to visit, ICD-9:  272.4 4)  Lipid Panel prior to visit, ICD-9:   272.4  5)  HbgA1C prior to visit, ICD-9:  250.00 6)  Urine Microalbumin prior to visit, ICD-9: 250.00 7)  Reticulocyte  count:  285.9 8)  Please return for lab work one (1) week before your next appointment.    Prescriptions: AMLODIPINE BESYLATE 10 MG  TABS (AMLODIPINE BESYLATE) one by mouth once daily  #30 x 5   Entered by:   Glendell Docker   Authorized by:   D. Thomos Lemons DO   Signed by:   Glendell Docker on 12/09/2007   Method used:   Electronically sent to ...       [id number]       [address]       [place], [place]  [address]       Ph: [phone] or [phone]       Fax: [phone]   RxID:   [id number] AMLODIPINE BESYLATE 10 MG  TABS (AMLODIPINE BESYLATE) one by mouth once daily  #30 x 5   Entered by:   D. Thomos Lemons DO   Authorized by:   Glendell Docker   Signed by:   D. Thomos Lemons DO on 12/08/2007   Method used:   Electronically sent to ...       [id number]       [address]       [place], [place]  [address]       Ph: [phone] or [phone]       Fax: [phone]   RxID:   [id number]  ]  Current Allergies (reviewed today): ! ACE INHIBITORS

## 2010-07-27 NOTE — Progress Notes (Signed)
Summary:  Enteroscopy Scheduled   Phone Note Outgoing Call   Call placed by: Laureen Ochs LPN,  April 28, 2009 10:31 AM Call placed to: Patient Summary of Call: HGB. 10.2, per Dr.Kaplan-Pt. needs an Enteroscopy.  Message left for patient to callback.  Initial call taken by: Laureen Ochs LPN,  April 28, 2009 10:32 AM  Follow-up for Phone Call        Message left for patient to callback.Laureen Ochs LPN  April 29, 2009 8:56 AM   Pt. scheduled her previsit for today at 2pm and her Enteroscopy for 05-04-09 in [facility] at 3pm. Pt. instructed to call back as needed.  Follow-up by: Laureen Ochs LPN,  May 02, 2009 8:57 AM

## 2010-07-27 NOTE — Assessment & Plan Note (Signed)
Summary: 3 month fu/dt   Vital Signs:  Patient profile:   50 year old female Menstrual status:  hysterectomy Height:      64 inches Weight:      223.75 pounds BMI:     38.55 O2 Sat:      98 % on Room air Temp:     97.7 degrees F oral Pulse rate:   81 / minute Resp:     18 per minute BP sitting:   120 / 90  (left arm) Cuff size:   large  Vitals Entered By: Glendell Docker CMA (July 03, 2010 9:20 AM)  O2 Flow:  Room air  Contraindications/Deferment of Procedures/Staging:    Test/Procedure: PAP Smear    Reason for deferment: hysterectomy  CC: 3 Month Follow up  Is Patient Diabetic? No Pain Assessment Patient in pain? no          Menstrual Status hysterectomy Last PAP Result Hysterectomy   Primary Care Provider:  Dondra Spry DO  CC:  3 Month Follow up .  History of Present Illness: 50 y/o AA female for follow up pt having persistent GI issues she has appt next week  intermittent left breast pain - near nipple area symptoms started after having mammogram in Sept pt has not palpated mass no improvement with vitamin E she also notes issues boil on left breast (comes and goes) pain not related to boil  mammogram in 02/2010  No mammographic evidence of malignancy.  Suggest yearly screening      mammography.  Preventive Screening-Counseling & Management  Alcohol-Tobacco     Smoking Status: quit  Allergies: 1)  Ace Inhibitors  Past History:  Past Medical History: CHEST PAIN, ATYPICAL (ICD-786.59) HYPERLIPIDEMIA (ICD-272.2)  HYPERTENSION (ICD-401.9)    GERD (ICD-530.81)  DIABETES MELLITUS, TYPE II BORDERLINE (ICD-790.29)   CONSTIPATION (ICD-564.00) GASTROPARESIS (ICD-536.3)   ANEMIA-IRON DEFICIENCY (ICD-280.9)    ESOPHAGEAL STRICTURE (ICD-530.3)  ALLERGIC RHINITIS (ICD-477.9)   MIGRAINE HEADACHE (ICD-346.90) ENDOMETRIOSIS (ICD-617.9) OVARIAN CYST (ICD-620.2) PEPTIC ULCER DISEASE (ICD-533.90)    Non compliance  Past Surgical  History: Exploratory Laparotomy w/ BSO (2003)  Lt. Knee (2005)      Hysterectomy    Cholecystectomy  Cardiac cath 2009 - mild non obstructive CAD  Family History: Mother is age 42 with hypertension.  Father is 41 and also has hypertension, diabetes, noted to have prostate cancer.  She has a grandmother and sister that are diabetic.  No FH of Colon Cancer: Family History of Kidney Disease:Father           Social History: Married  Has 2 grown children.  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  Former Smoker Quit tobacco in 1996.  She was a pack a day smoker for approximately 10 years.  Alcohol use-yes: Social  Daily Caffeine Use:6 pack of pepsi daily   Illicit Drug Use - no  Patient does not get regular exercise.     Smoking Status:  quit  Physical Exam  General:  alert, well-developed, and well-nourished.   Breasts:  slight firm area 9 o'clock of left breast , skin/areolae normal, no abnormal thickening, no nipple discharge, and no adenopathy.   Lungs:  normal respiratory effort and normal breath sounds.   Heart:  normal rate, regular rhythm, and no gallop.     Impression & Recommendations:  Problem # 1:  MASTODYNIA (ICD-611.71) pt with unexplained left breast pain.  refer to breast center for u/s question whether reglan is contributing facter  - DC  reglan  Orders: Radiology Referral (Radiology)  Problem # 2:  ABDOMINAL PAIN, EPIGASTRIC (ICD-789.06) follow up with GI  The following medications were removed from the medication list:    Reglan 10 Mg Tabs (Metoclopramide hcl) .Marland Kitchen... Take 1 30 minutes before breakfast, lunch, dinner and at bedtime.  Problem # 3:  HYPERTENSION (ICD-401.9) Assessment: Unchanged  Her updated medication list for this problem includes:    Diovan Hct 160-12.5 Mg Tabs (Valsartan-hydrochlorothiazide) .Marland Kitchen... Take 1 tablet by mouth once a day  BP today: 120/90 Prior BP: 126/90 (03/01/2010)  Labs Reviewed: K+: 3.1 (11/22/2009) Creat: : 1.0  (11/22/2009)   Chol: 206 (05/06/2009)   HDL: 48.70 (05/06/2009)   LDL: DEL (02/12/2008)   TG: 93.0 (05/06/2009)  Complete Medication List: 1)  Diovan Hct 160-12.5 Mg Tabs (Valsartan-hydrochlorothiazide) .... Take 1 tablet by mouth once a day 2)  Ondansetron Hcl 4 Mg Tabs (Ondansetron hcl) .... One by mouth two times a day as needed for nausea 3)  Klor-con M20 20 Meq Cr-tabs (Potassium chloride crys cr) .... One by mouth once daily 4)  Nexium 40 Mg Cpdr (Esomeprazole magnesium) .Marland Kitchen.. 1 capsule each day 30 minutes before meal  Patient Instructions: 1)  Stop taking reglan.  It may be contributing to breast discomfort 2)  Our office will contact you re:  scheduling referral to breast center for breast ultrasound 3)  Please schedule a follow-up appointment in 2 months.   Orders Added: 1)  Radiology Referral [Radiology] 2)  Est. Patient Level III [91478]    Current Allergies (reviewed today): ACE INHIBITORS    Preventive Care Screening  Pap Smear:    Date:  07/03/2010    Results:  Hysterectomy

## 2010-07-27 NOTE — Progress Notes (Signed)
Summary: Triage   Phone Note Call from Patient Call back at [phone]   Caller: Patient Call For: Dr. Arlyce Dice Reason for Call: Talk to Nurse Summary of Call: every  time she eats something she feels like she is going to throw up and she wants to discuss that with a nurse Initial call taken by: [place] Johnson,  May 26, 2010 3:57 PM  Follow-up for Phone Call        Spoke with patient, states she is having a lot of nausea. Offered appointment with Mike Gip, PA early in the week. Patient wanted to see Dr. Arlyce Dice. Appointment made for 06/02/10 @ 2:30pm. Follow-up by: Selinda Michaels RN,  May 26, 2010 4:25 PM

## 2010-07-27 NOTE — Assessment & Plan Note (Signed)
Summary: 2 MONTH FOLLOW UP/MHF   Vital Signs:  Patient profile:   50 year old female Weight:      225.75 pounds BMI:     38.89 O2 Sat:      100 % on Room air Temp:     98.3 degrees F rectal Pulse rate:   83 / minute Pulse rhythm:   regular Resp:     18 per minute BP sitting:   110 / 70  (right arm) Cuff size:   large  Vitals Entered By: Glendell Docker CMA (July 20, 2009 8:03 AM)  O2 Flow:  Room air  Primary Care Provider:  D. Thomos Lemons DO  CC:  2 Month Follow up .  History of Present Illness: 2 Month Follow up disease management  50 y/o AA female for follow up no further issues with headache former smoker   myalgias - not improved, muscle pains are the same, no improvement with muscle relaxer or amitriptyline  GI bleeding - still having blood in stools.  reviewed gi notes. suspicion is for ischemic colitis  Htn - BP slightly low.  mild dizziness  Preventive Screening-Counseling & Management  Alcohol-Tobacco     Smoking Status: quit  Allergies (verified): No Known Drug Allergies  Past History:  Past Medical History: CHEST PAIN, ATYPICAL (ICD-786.59) HYPERLIPIDEMIA (ICD-272.2)  HYPERTENSION (ICD-401.9)  GERD (ICD-530.81)  DIABETES MELLITUS, TYPE II BORDERLINE (ICD-790.29)   CONSTIPATION (ICD-564.00) GASTROPARESIS (ICD-536.3) ANEMIA-IRON DEFICIENCY (ICD-280.9)   ESOPHAGEAL STRICTURE (ICD-530.3)  ALLERGIC RHINITIS (ICD-477.9)   MIGRAINE HEADACHE (ICD-346.90) ENDOMETRIOSIS (ICD-617.9) OVARIAN CYST (ICD-620.2) PEPTIC ULCER DISEASE (ICD-533.90)    Non compliance  Past Surgical History: Exploratory Laparotomy w/ BSO (2003)  Lt. Knee (2005)   Hysterectomy  Cholecystectomy  Cardiac cath 2009 - mild non obstructive CAD  Family History: Mother is age 32 with hypertension.  Father is 25 and also has hypertension, diabetes, noted to have prostate cancer.  She has a grandmother and sister that are diabetic.  No FH of Colon Cancer: Family History  of Kidney Disease:Father       Social History: Married Has 2 grown children.  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  Former Smoker Quit tobacco in 1996.  She was a pack a day smoker for approximately 10 years.  Alcohol use-yes: Social Daily Caffeine Use:6 pack of pepsi daily  Illicit Drug Use - no  Patient does not get regular exercise.     Physical Exam  General:  alert, well-developed, and well-nourished.   Lungs:  normal respiratory effort and normal breath sounds.   Heart:  normal rate, regular rhythm, no murmur, and no gallop.   Abdomen:  soft.  mild left sided tenderness Rectal:  normal sphincter tone and internal hemorrhoid(s). grossly heme positive     Impression & Recommendations:  Problem # 1:  RECTAL BLEEDING (ICD-569.3) Pt having persistent rectal bleeding.   Unclear source of bleeding.  she does have internal hemorrhoids on exam.  she has left sided abd pain.   I suggest she f/u GI to consider colonoscopy.  Problem # 2:  HYPERTENSION (ICD-401.9) BP is somewhat low for her.  reduce dose of meds.  esp if there is concern for low level ischemic colitis  The following medications were removed from the medication list:    Hydrochlorothiazide 25 Mg Tabs (Hydrochlorothiazide) .Marland Kitchen... Take 1 tablet by mouth once a day    Lisinopril 20 Mg Tabs (Lisinopril) .Marland Kitchen... Take 1 tablet by mouth once a day Her updated medication list for  this problem includes:    Lisinopril-hydrochlorothiazide 10-12.5 Mg Tabs (Lisinopril-hydrochlorothiazide) ..... One by mouth qd  BP today: 110/70 Prior BP: 110/78 (05/26/2009)  Labs Reviewed: K+: 3.6 (07/18/2009) Creat: : 0.9 (07/18/2009)   Chol: 206 (05/06/2009)   HDL: 48.70 (05/06/2009)   LDL: DEL (02/12/2008)   TG: 93.0 (05/06/2009)  Problem # 3:  FIBROMYALGIA (ICD-729.1) Pt seen by Dr. Corliss Skains.  suspicion is for myofascial pain syndrome.  no significant response with elavil.  trial of gabapentin. she still has mildly elevated sed rate -  etiology unclear.  SPEP sent by rheum - obtain copy bone scan and CT scan of abd negative.  (it noted incidental hepatic hemanioma) Her updated medication list for this problem includes:    Aspirin 81 Mg Tbec (Aspirin) .Marland Kitchen... Take 1 tablet by mouth once a day  Complete Medication List: 1)  Nu-iron 150 Mg Caps (Polysaccharide iron complex) .... Take 1 capsule by mouth two times a day 2)  Aspirin 81 Mg Tbec (Aspirin) .... Take 1 tablet by mouth once a day 3)  Crestor 10 Mg Tabs (Rosuvastatin calcium) .... One by mouth once daily (hold) 4)  Bentyl 20 Mg Tabs (Dicyclomine hcl) .... Take 1 tablet by mouth three times a day as needed cramping 5)  Lisinopril-hydrochlorothiazide 10-12.5 Mg Tabs (Lisinopril-hydrochlorothiazide) .... One by mouth qd 6)  Lactulose 10 Gm/46ml Soln (Lactulose) .Marland Kitchen.. 15 ml two times a day as needed constipation 7)  Gabapentin 100 Mg Caps (Gabapentin) .... One by mouth at bedtime x 7 days, then 2 caps by mouth qhs  Patient Instructions: 1)  Take half of current rx of lisinopril and hctz then start new prescription. 2)  Please schedule a follow-up appointment in 2 months. 3)  CBC w/ Diff prior to visit, ICD-9: 280.9 4)  BMP prior to visit, ICD-9: 401.9 5)  Please return for lab work one (1) week before your next appointment.  Prescriptions: GABAPENTIN 100 MG CAPS (GABAPENTIN) one by mouth at bedtime x 7 days, then 2 caps by mouth qhs  #60 x 1   Entered and Authorized by:   D. Thomos Lemons DO   Signed by:   D. Thomos Lemons DO on 07/20/2009   Method used:   Electronically to        [facility] (retail)       [address]       [place], [place]  [address]       Ph: [phone]       Fax: [phone]   RxID:   [id number] LACTULOSE 10 GM/15ML SOLN (LACTULOSE) 15 ml two times a day as needed constipation  #1 month x 2   Entered and Authorized by:   D. Thomos Lemons DO   Signed by:   D. Thomos Lemons DO on 07/20/2009   Method used:    Electronically to        [facility] (retail)       [address]       [place], [place]  [address]       Ph: [phone]       Fax: [phone]   RxID:   [id number] LISINOPRIL-HYDROCHLOROTHIAZIDE 10-12.5 MG TABS (LISINOPRIL-HYDROCHLOROTHIAZIDE) one by mouth qd  #30 x 3   Entered and Authorized by:   D. Thomos Lemons DO   Signed by:   D. Thomos Lemons DO on 07/20/2009   Method  used:   Electronically to        [facility] (retail)       [address]       [place], [place]  [address]       Ph: [phone]       Fax: [phone]   RxID:   [id number]   Current Allergies (reviewed today): No known allergies

## 2010-07-27 NOTE — Letter (Signed)
Summary: Patient Notice- Polyp Results  [facility]  [address] [place], [place] [address]   Phone: [phone]  Fax: [phone]        December 31, 2008 MRN: [id number]    [facility] [address] [place], [place]  [address]    Dear Ms. Schoeller,  I am pleased to inform you that the colon polyp(s) removed during your recent colonoscopy was (were) found to be benign (no cancer detected) upon pathologic examination.  I recommend you have a repeat colonoscopy examination in 5_ years to look for recurrent polyps, as having colon polyps increases your risk for having recurrent polyps or even colon cancer in the future.  Should you develop new or worsening symptoms of abdominal pain, bowel habit changes or bleeding from the rectum or bowels, please schedule an evaluation with either your primary care physician or with me.  Additional information/recommendations:  __ No further action with gastroenterology is needed at this time. Please      follow-up with your primary care physician for your other healthcare      needs.  __ Please call [phone] to schedule a return visit to review your      situation.  __ Please keep your follow-up visit as already scheduled.  _x_ Continue treatment plan as outlined the day of your exam.  Please call us if you are having persistent problems or have questions about your condition that have not been fully answered at this time.  Sincerely,  Louis Meckel MD  This letter has been electronically signed by your physician.

## 2010-07-27 NOTE — Assessment & Plan Note (Signed)
Summary: ABD PAIN, NAUSEA, DIARRHEA / TF,CMA   Vital Signs:  Patient profile:   50 year old female Height:      64 inches Weight:      226.75 pounds BMI:     39.06 O2 Sat:      100 % on Room air Temp:     98.5 degrees F oral Pulse rate:   72 / minute Pulse rhythm:   regular Resp:     16 per minute BP sitting:   140 / 90  (right arm) Cuff size:   large  Vitals Entered By: Mervin Kung CMA (Nov 01, 2009 1:38 PM)  O2 Flow:  Room air CC: room 3  Pt states she is having abdominal pain radiating to her back and shoulders. Has had diarrhea, nausea and vomiting since last Wednesday. Is Patient Diabetic? Yes   Primary Care Provider:  Dondra Spry DO  CC:  room 3  Pt states she is having abdominal pain radiating to her back and shoulders. Has had diarrhea and nausea and vomiting since last Wednesday.[name]  History of Present Illness: 50 y/o AA female c/o of abd pain her symptoms started 1 week ago epigastric pain, left upper quad pain,  radiates to back  nausea and vomiting 3 or 4 episodes of vomiting 3 - 4 BMs per day described as loose no fever or chills no urinary symptoms pain describes as sharp severity 8 out of 10 nexium, reglan does not help  feels weak she notes assoc pain upper back , neck and left arm  EKG  Procedure date:  11/01/2009  Findings:      NSR at 68 bpm.  minimal voltage criteria for LVH no acute ST changes  Allergies: 1)  Ace Inhibitors  Past History:  Past Medical History: CHEST PAIN, ATYPICAL (ICD-786.59) HYPERLIPIDEMIA (ICD-272.2)  HYPERTENSION (ICD-401.9)   GERD (ICD-530.81)  DIABETES MELLITUS, TYPE II BORDERLINE (ICD-790.29)   CONSTIPATION (ICD-564.00) GASTROPARESIS (ICD-536.3)  ANEMIA-IRON DEFICIENCY (ICD-280.9)   ESOPHAGEAL STRICTURE (ICD-530.3)  ALLERGIC RHINITIS (ICD-477.9)   MIGRAINE HEADACHE (ICD-346.90) ENDOMETRIOSIS (ICD-617.9) OVARIAN CYST (ICD-620.2) PEPTIC ULCER DISEASE (ICD-533.90)    Non compliance  Past  Surgical History: Exploratory Laparotomy w/ BSO (2003)  Lt. Knee (2005)     Hysterectomy  Cholecystectomy  Cardiac cath 2009 - mild non obstructive CAD  Family History: Mother is age 28 with hypertension.  Father is 72 and also has hypertension, diabetes, noted to have prostate cancer.  She has a grandmother and sister that are diabetic.  No FH of Colon Cancer: Family History of Kidney Disease:Father         Social History: Married  Has 2 grown children.  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  Former Smoker Quit tobacco in 1996.  She was a pack a day smoker for approximately 10 years.  Alcohol use-yes: Social  Daily Caffeine Use:6 pack of pepsi daily  Illicit Drug Use - no  Patient does not get regular exercise.     Review of Systems       The patient complains of anorexia.  The patient denies fever.   GI:  Complains of diarrhea, nausea, and vomiting.  Physical Exam  General:  alert and overweight-appearing.  non toxic Head:  normocephalic and atraumatic.   Mouth:  Oral mucosa and oropharynx without lesions or exudates.  Neck:  No deformities, masses, or tenderness noted. Lungs:  normal respiratory effort, normal breath sounds, no crackles, and no wheezes.   Heart:  normal rate, regular rhythm,  and no gallop.   Abdomen:  epigastric tenderness, LLQ tenderness,  normal bowel sounds, no masses, no guarding, no rigidity, and no rebound tenderness.   Neurologic:  cranial nerves II-XII intact and gait normal.   Psych:  normally interactive and good eye contact.     Impression & Recommendations:  Problem # 1:  ABDOMINAL PAIN, EPIGASTRIC (ICD-789.06) 50 y/o AA female with acute epigastric pain of unclear etiology.  rule out pancreatitis.  prev EGD in 04/2009 shows gastritis.   unclear how regularly pt has been taking her PPI.   change to zegerid. her symptoms worse with food.  If lipase normal and pt has persistent symptoms, consider send to GI for sphincter of oddi dysfunction    Her updated medication list for this problem includes:    Metoclopramide Hcl 10 Mg Tabs (Metoclopramide hcl) .[name]... Take 1 tablet by mouth every 6 hours  Orders: T-Basic Metabolic Panel [phone]) T-Hepatic Function [phone]) T-CBC w/Diff [phone]) T-Lipase [phone])  Problem # 2:  DIARRHEA (ICD-787.91) Loose stools.  no recent abx use continue immodium as needed. BRAT diet Increase fluids Patient advised to call office if symptoms persist or worsen.  Problem # 3:  GERD (ICD-530.81) change to zegerid.    The following medications were removed from the medication list:    Nexium 40 Mg Cpdr (Esomeprazole magnesium) ..... One by mouth once daily Her updated medication list for this problem includes:    Omeprazole-sodium Bicarbonate 40-1100 Mg Caps (Omeprazole-sodium bicarbonate) ..... One by mouth once daily  Problem # 4:  HYPERTENSION (ICD-401.9) Pt feeling weak.  hold BP med until GI symptoms resolved.  Her updated medication list for this problem includes:    Diovan Hct 160-12.5 Mg Tabs (Valsartan-hydrochlorothiazide) ..... One by mouth once daily (hold until nausea, vomiting and diarrhea resolved)  BP today: 140/90 Prior BP: 150/100 (09/19/2009)  Labs Reviewed: K+: 3.6 (09/14/2009) Creat: : 0.8 (09/14/2009)   Chol: 206 (05/06/2009)   HDL: 48.70 (05/06/2009)   LDL: DEL (02/12/2008)   TG: 93.0 (05/06/2009)  Complete Medication List: 1)  Gabapentin 100 Mg Caps (Gabapentin) .... Take 2 capsules by mouth at bedtime 2)  Metoclopramide Hcl 10 Mg Tabs (Metoclopramide hcl) .... Take 1 tablet by mouth every 6 hours 3)  Methocarbamol 500 Mg Tabs (Methocarbamol) .... Take 1 tablet by mouth two times a day 4)  Cyclobenzaprine Hcl 10 Mg Tabs (Cyclobenzaprine hcl) .... Take 1 tablet by mouth at bedtime as needed 5)  Milk of (unknown Dosage)  .... Take 2 tablespoons by mouth daily 6)  Fexofenadine Hcl 180 Mg Tabs (Fexofenadine hcl) .... One by mouth once daily 7)  Diovan  Hct 160-12.5 Mg Tabs (Valsartan-hydrochlorothiazide) .... One by mouth once daily (hold until nausea, vomiting and diarrhea resolved) 8)  Ondansetron Hcl 4 Mg Tabs (Ondansetron hcl) .... One by mouth two times a day as needed for nausea 9)  Omeprazole-sodium Bicarbonate 40-1100 Mg Caps (Omeprazole-sodium bicarbonate) .... One by mouth once daily  Other Orders: EKG w/ Interpretation (93000) UA Dipstick w/o Micro (manual) (44010) Specimen Handling (99000) T-Culture, Urine ([id number])  Patient Instructions: 1)  Continue immodium as needed for diarrhea 2)  Follow BRAT diet x 24-48 hrs ( bananas, rice, apple sauce, dry toast) 3)  Please schedule a follow-up appointment in 1 week 4)  Call our office if your symptoms do not  improve or gets worse. Prescriptions: OMEPRAZOLE-SODIUM BICARBONATE 40-1100 MG CAPS (OMEPRAZOLE-SODIUM BICARBONATE) one by mouth once daily  #30 x 3   Entered and Authorized by:  Dondra Spry DO   Signed by:   D. Thomos Lemons DO on 11/01/2009   Method used:   Electronically to        [facility] (retail)       [address]       [place], [place]  [address]       Ph: [phone]       Fax: [phone]   RxID:   [id number] ONDANSETRON HCL 4 MG TABS (ONDANSETRON HCL) one by mouth two times a day as needed for nausea  #30 x 0   Entered and Authorized by:   D. Thomos Lemons DO   Signed by:   D. Thomos Lemons DO on 11/01/2009   Method used:   Electronically to        [facility] (retail)       [address]       [place], [place]  [address]       Ph: [phone]       Fax: [phone]   RxID:   [id number]   Current Allergies (reviewed today): ACE INHIBITORS  Laboratory Results   Urine Tests    Routine Urinalysis   Color: orange Appearance: Clear Glucose: negative   (Normal Range: Negative) Bilirubin: negative   (Normal Range: Negative) Ketone: negative   (Normal Range: Negative) Spec.  Gravity: 1.020   (Normal Range: 1.003-1.035) Blood: trace-intact   (Normal Range: Negative) pH: 6.0   (Normal Range: 5.0-8.0) Protein: trace   (Normal Range: Negative) Urobilinogen: 0.2   (Normal Range: 0-1) Nitrite: negative   (Normal Range: Negative) Leukocyte Esterace: negative   (Normal Range: Negative)

## 2010-07-27 NOTE — Letter (Signed)
Summary: Appointment Reminder  [facility]  [address] [place], [place] [address]   Phone: [phone]  Fax: [phone]        December 06, 2008 MRN: [id number]    [facility] [address] [place], [place]  [address]    Dear Ms. Marrin,   We have been unable to reach you by phone to schedule a Colonoscopy  appointment that was recommended for you by Dr. Arlyce Dice. It is very   important that we reach you to schedule this appointment. We hope that   you allow [place] to participate in your health care needs. Please contact   Zella Ball, [phone] at your earliest convenience to schedule your   appointment.     Sincerely,     Ivor Messier    Merri Ray CMA

## 2010-07-27 NOTE — Progress Notes (Signed)
Summary: Unresolved Bowel problems  Phone Note Call from Patient   Caller: Patient Details for Reason: constipation Summary of Call: Pt seen in Jan for constipation  precribed Lactulose  ,no improvement     #    [phone]    Initial call taken by: Darral Dash,  August 03, 2009 9:55 AM  Follow-up for Phone Call        have her pick up samples of amitiza 8 micrograms.  take two times a day as needed Follow-up by: D. Thomos Lemons DO,  August 03, 2009 11:05 AM  Additional Follow-up for Phone Call Additional follow up Details #1::        attempted to contact patient no answe, no voicemail Amitiza samples left at front desk for patient pick up Additional Follow-up by: Glendell Docker CMA,  August 03, 2009 11:57 AM    Additional Follow-up for Phone Call Additional follow up Details #2::    Patient notified. Follow-up by: Lucious Groves,  August 04, 2009 9:18 AM  New/Updated Medications: AMITIZA 8 MCG CAPS (LUBIPROSTONE) one by mouth two times a day as needed constipation

## 2010-07-27 NOTE — Procedures (Signed)
Summary: Gastroenterology Egd  Gastroenterology Egd   Imported By: Darcey Nora RN 08/14/2007 15:37:33  _____________________________________________________________________  External Attachment:    Type:   Image     Comment:   External Document

## 2010-07-27 NOTE — Procedures (Signed)
Summary: Gastroenterology Col  Gastroenterology Col   Imported By: Darcey Nora RN 08/14/2007 15:34:33  _____________________________________________________________________  External Attachment:    Type:   Image     Comment:   External Document

## 2010-07-27 NOTE — Progress Notes (Signed)
Summary: TRIAGE   Phone Note Call from Patient Call back at Home Phone [phone]   Caller: Patient Call For: Arlyce Dice Reason for Call: Talk to Nurse Summary of Call: Patient has severe stomach pain and is unable to sleep since Monday wants to see someone before first available appt 9-7 Initial call taken by: Tawni Levy,  January 19, 2009 8:22 AM  Follow-up for Phone Call        Constant epigastric pain and burning since Monday, pt. unable to sleep. Pt. has tried Oxycodone and a muscle relaxer, no relief. Denies n/v, bloating,fever. Wants to be seen ASAP.  Pt. will see Dr.Kaplan today at 3:30pm. Follow-up by: Laureen Ochs LPN,  January 19, 2009 8:27 AM

## 2010-07-27 NOTE — Assessment & Plan Note (Signed)
Summary: F/U LABS,COLON,CAP.ENDO. AND CONTINUED C/O EPIGASTRIC PAIN   ...    History of Present Illness Visit Type: Follow-up Visit Primary GI MD: Melvia Heaps MD [facility] Primary Provider: Thomos Lemons, DO Chief Complaint: review labs, colon , capsule endo and still c/o Epigastric pain [name] has returned for evaluation of left upper quadrant pain.  This began 2 days ago.  It is fairly constant though exacerbated by eating.  [name] denies dysuria or urinary frequency, nausea, vomiting or pyrosis.  [name] underwent workup including upper and lower endoscopy and capsule endoscopy for heme-positive stool and a microcytic anemia.  Small nonbleeding adenomatous polyps were removed from the colon.  Endoscopy demonstrated a nodular duodenum.  Pathology showed Brunner's gland hyperplasia.  Capsule endoscopy was entirely unremarkable.   GI Review of Systems    Reports abdominal pain and  chest pain.     Location of  Abdominal pain: upper abdomen.    Denies acid reflux, belching, bloating, dysphagia with liquids, dysphagia with solids, heartburn, loss of appetite, nausea, vomiting, vomiting blood, weight loss, and  weight gain.        Denies anal fissure, black tarry stools, change in bowel habit, constipation, diarrhea, diverticulosis, fecal incontinence, heme positive stool, hemorrhoids, irritable bowel syndrome, jaundice, light color stool, liver problems, rectal bleeding, and  rectal pain.    Current Medications (verified): 1)  Crestor 20 Mg  Tabs (Rosuvastatin Calcium) .... One By Mouth Once Daily 2)  Amlodipine Besylate 10 Mg  Tabs (Amlodipine Besylate) .... One By Mouth Once Daily 3)  Benicar 20 Mg  Tabs (Olmesartan Medoxomil) .... One By Mouth Qd 4)  Clinoril 200 Mg Tabs (Sulindac) .... One Tab B.i.d. For 5 Days Then P.r.n. 5)  Miralax   Powd (Polyethylene Glycol 3350) .... As Per Prep  Instructions. 6)  Reglan 10 Mg  Tabs (Metoclopramide Hcl) .... As Per Prep Instructions.  Allergies  (verified): 1)  ! Ace Inhibitors  Past History:  Past Medical History: Reviewed history from 10/25/2008 and no changes required. Hypertension GERD Gastroparesis History of H.Pylori Microsocpic Anemia Left knee Chondromalacia Diabetes Type II Esophageal Stricture S/P dilation Medical non compliance ABDOMINAL PAIN, LEFT UPPER QUADRANT (ICD-789.02)  MIGRAINE HEADACHE (ICD-346.90) ENDOMETRIOSIS (ICD-617.9) OVARIAN CYST (ICD-620.2) PEPTIC ULCER DISEASE (ICD-533.90) HIATAL HERNIA (ICD-553.3) DYSLIPIDEMIA (ICD-272.4) HYPERLIPIDEMIA (ICD-272.2) HYPERTENSION (ICD-401.9) CHEST PAIN, ATYPICAL (ICD-786.59) CHRONIC CONSTIPATION,NEGATIVE COLON3/06 IRON DEFICIENCY ANEMIA RIGHT HEPATIC LOBE HEMANGIOMA  Past Surgical History: Reviewed history from 11/11/2008 and no changes required. Exploratory Laparotomy w/ BSO (2003) Lt. Knee (2005)  Hysterectomy Cholecystectomy  Family History: Reviewed history from 11/11/2008 and no changes required. Mother is age 66 with hypertension.  Father is 32 and also has hypertension, diabetes, noted to have prostate cancer.  [name] has a grandmother and sister that are diabetic.  No FH of Colon Cancer: Family History of Kidney Disease:Father   Social History: Reviewed history from 11/11/2008 and no changes required. Married Has 2 grown children.  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  Former Smoker Quit tobacco in 1996.  [name] was a pack a day smoker for approximately 10 years.  Alcohol use-yes: Social Daily Caffeine Use:6 pack of pepsi daily  Illicit Drug Use - no Patient does not get regular exercise.   Review of Systems  The patient denies allergy/sinus, anemia, anxiety-new, arthritis/joint pain, back pain, blood in urine, breast changes/lumps, change in vision, confusion, cough, coughing up blood, depression-new, fainting, fatigue, fever, headaches-new, hearing problems, heart murmur, heart rhythm changes, itching, menstrual pain, muscle  pains/cramps, night sweats,  nosebleeds, pregnancy symptoms, shortness of breath, skin rash, sleeping problems, sore throat, swelling of feet/legs, swollen lymph glands, thirst - excessive , urination - excessive , urination changes/pain, urine leakage, vision changes, and voice change.    Vital Signs:  Patient profile:   50 year old [name] Height:      64.5 inches Weight:      222.50 pounds BMI:     37.74 Pulse rate:   72 / minute Pulse rhythm:   regular BP sitting:   198 / 88  (left arm)  Vitals Entered By: Milford Cage NCMA (January 19, 2009 3:33 PM)  Physical Exam  Additional Exam:  [name] is a healthy-appearing [name]  skin: anicteric HEENT: normocephalic; PEERLA; no nasal or pharyngeal abnormalities neck: supple nodes: no cervical lymphadenopathy chest: clear to ausculatation and percussion heart: no murmurs, gallops, or rubs abd: soft, nontender; BS normoactive; no abdominal masses, tenderness, organomegaly rectal: deferred ext: no cynanosis, clubbing, edema skeletal: no deformities neuro: oriented x 3; no focal abnormalities    Impression & Recommendations:  Problem # 1:  ABDOMINAL PAIN, LEFT UPPER QUADRANT (ICD-789.02) Assessment Deteriorated Symptoms certainly are nonspecific.  [name] has had similar pain in other areas of the abdomen in the past.  Recommendations #1 trial hyomax 0.375 mg twice a day  Problem # 2:  ANEMIA-IRON DEFICIENCY (ICD-280.9) Source for [name] heme-positive stools and iron deficiency anemia has not been determined.   Recommendations #1 begin iron supplementation #2 check CBC in 3 months  Problem # 3:  DIABETES MELLITUS, TYPE II (ICD-250.00) Assessment: Comment Only Prescriptions: FEOSOL 200 (65 FE) MG TABS (FERROUS SULFATE DRIED) take one tab daily  #30 x 2   Entered and Authorized by:   Louis Meckel MD   Signed by:   Louis Meckel MD on 01/19/2009   Method used:   [facility] to        [id number]        [address]       [place], [place]  [address]       Ph: [phone] or [phone]       Fax: [phone]   RxID:   [id number] HYOMAX-DT 0.375 MG CR-TABS (HYOSCYAMINE SULFATE) take one tab twice a day for 5 days then as needed  #15 x 2   Entered and Authorized by:   Louis Meckel MD   Signed by:   Louis Meckel MD on 01/19/2009   Method used:   [id number]       [address]       [place], [place]  [address]       Ph: [phone] or [phone]       Fax: [phone]   RxID:   [id number]

## 2010-07-27 NOTE — Procedures (Signed)
Summary: EGD   EGD  Procedure date:  10/26/2008  Findings:      Location: [facility]    ENDOSCOPY PROCEDURE REPORT  PATIENT:  Denise, [name]  MR#:  [id number] BIRTHDATE:   10-29-1960, 48 yrs. old   GENDER:   female  ENDOSCOPIST:   Judie Petit T. Russella Dar, MD, [facility]    PROCEDURE DATE:  10/26/2008 PROCEDURE:  EGD with biopsy ASA CLASS:   Class II INDICATIONS: right upper quadrant pain, iron deficiency anemia, nausea and vomiting   MEDICATIONS:   Fentanyl 100 mcg IV, Versed 10 mg IV TOPICAL ANESTHETIC:   Exactacain Spray  DESCRIPTION OF PROCEDURE:   After the risks benefits and alternatives of the procedure were thoroughly explained, informed consent was obtained.  The EG-[id number] ([id number]) endoscope was introduced through the mouth and advanced to the second portion of the duodenum, without limitations.  The instrument was slowly withdrawn as the mucosa was fully examined. <<PROCEDUREIMAGES>>          <<OLD IMAGES>>  Nodular mucosa was found in the descending duodenum. There were multiple smooth, nonspecific and small (3-43mm) submucosal nodules. Multiple biopsies were obtained and sent to pathology.  The duodenal bulb was normal. The esophagus and gastroesophageal junction were completely normal in appearance.  The stomach was entered and closely examined. The pylorus, antrum, angularis, and lesser curvature were well visualized, including a retroflexed view of the cardia and fundus. The stomach wall was normally distensable. The scope passed easily through the pylorus into the duodenum.  Retroflexed views revealed no abnormalities.  The scope was then withdrawn from the patient and the procedure completed.  COMPLICATIONS:   None  ENDOSCOPIC IMPRESSION:  1) Nodular mucosa in the descending duodenum      RECOMMENDATIONS:  1) await pathology results  2) follow-up: GI clinic 2 weeks with Dr. Arlyce Dice    _______________________________ Venita Lick. Russella Dar, MD,  [facility]    CC: Melvia Heaps, MD     Thomos Lemons, DO This report was created from the original endoscopy report, which was reviewed and signed by the above listed endoscopist.

## 2010-07-27 NOTE — Progress Notes (Signed)
Summary: Schedule Colonoscopy   Phone Note Outgoing Call Call back at [facility] Phone [phone]   Call placed by: Merri Ray CMA,  December 01, 2008 10:46 AM Reason for Call: Discuss lab or test results Summary of Call: L/M for pt to return my call, Need to discuss lab results and schedule pt  colonoscopy. Initial call taken by: Merri Ray CMA,  December 01, 2008 10:47 AM  Follow-up for Phone Call        Tried to call pt again no answer l/m for pt to return call Merri Ray CMA  December 03, 2008 4:44 PM Tried to call pt back again this morning there was no answer. Merri Ray CMA  December 06, 2008 8:53 AM Pt returned my call, scheduled Colonoscopy for 12/29/2008 at 10AM and Previsit is scheduled for 12/21/2008 at 8AM Merri Ray CMA  December 06, 2008 9:13 AM  Follow-up by: Merri Ray CMA,  December 03, 2008 4:44 PM

## 2010-07-27 NOTE — Assessment & Plan Note (Signed)
Summary: PROCEDURE F-UP/YF  Medications Added CLINORIL 200 MG TABS (SULINDAC) one tab b.i.d. for 5 days then p.r.n.        History of Present Illness Visit Type: follow up Primary GI MD: Melvia Heaps MD [facility] Primary Provider: Thomos Lemons, D.O. Chief Complaint: follow-up visit History of Present Illness:   [name] has returned for reevaluation of her abdominal pain. Recent upper endoscopy was pertinent only for some nonspecific nodules in her duodenum. Biopsies showed chronic inflammation only. Pain seems to t worsen with position and moving about. It has not improved with hyoscyamine. She denies nausea or pyrosis. She still has some mild constipation.    GI Review of Systems    Reports abdominal pain.     Location of  Abdominal pain: upper abdomen.    Denies acid reflux, belching, bloating, chest pain, dysphagia with liquids, dysphagia with solids, heartburn, loss of appetite, nausea, vomiting, vomiting blood, weight loss, and  weight gain.      Reports constipation.     Denies anal fissure, black tarry stools, change in bowel habit, diarrhea, diverticulosis, fecal incontinence, heme positive stool, hemorrhoids, irritable bowel syndrome, jaundice, light color stool, liver problems, rectal bleeding, and  rectal pain.     Prior Medications Reviewed Using: Medication Bottles  Updated Prior Medication List: CRESTOR 20 MG  TABS (ROSUVASTATIN CALCIUM) one by mouth once daily AMLODIPINE BESYLATE 10 MG  TABS (AMLODIPINE BESYLATE) one by mouth once daily BENICAR 20 MG  TABS (OLMESARTAN MEDOXOMIL) one by mouth qd HYOSCYAMINE SULFATE CR 0.375 MG  XR12H-TAB (HYOSCYAMINE SULFATE) 1 tab bid  Current Allergies (reviewed today): ! ACE INHIBITORS  Past Medical History:    Reviewed history from 01/20/2008 and no changes required:       Hypertension       GERD       Gastroparesis       History of H.Pylori       Microsocpic Anemia       Left knee Chondromalacia       Diabetes Type II     Esophageal Stricture S/P dilation       Medical non compliance       ABDOMINAL PAIN, LEFT UPPER QUADRANT (ICD-789.02)        MIGRAINE HEADACHE (ICD-346.90)       ENDOMETRIOSIS (ICD-617.9)       OVARIAN CYST (ICD-620.2)       PEPTIC ULCER DISEASE (ICD-533.90)       HIATAL HERNIA (ICD-553.3)       DYSLIPIDEMIA (ICD-272.4)       HYPERLIPIDEMIA (ICD-272.2)       HYPERTENSION (ICD-401.9)       CHEST PAIN, ATYPICAL (ICD-786.59)       Constipation                Past Surgical History:    Reviewed history from 01/20/2008 and no changes required:       Partial Hysterctomy (1988)       Exploratory Laparotomy w/ BSO (2003)       Lt. Knee (2005)              Vital Signs:  Patient Profile:   50 Years Old Female Height:     64.5 inches Weight:      227.13 pounds BMI:     38.52 BSA:     2.08 Pulse rate:   68 / minute Pulse rhythm:   regular BP sitting:   130 / 88  (left arm)  Vitals  Entered By: Milford Cage CMA (February 19, 2008 10:11 AM)                  Physical Exam  She's a heavyset female  On abdominal exam she has mild tenderness to palpation in the left upper quadrant. Tenderness is worsened with abdominal wall muscle flexion. There are no, masses organomegaly    Impression & Recommendations:  Problem # 1:  ABDOMINAL PAIN, GENERALIZED, CHRONIC (ICD-789.07) At this point I think that her abdominal pain may be related to musculoskeletal pain.  Recommendations #1 DC hyoscyamine #2 trial of clinical 200 mg twice a day for 5 days then p.r.n.     Prescriptions: CLINORIL 200 MG TABS (SULINDAC) one tab b.i.d. for 5 days then p.r.n.  #25 x 2   Entered and Authorized by:   Louis Meckel MD   Signed by:   Louis Meckel MD on 02/19/2008   Method used:   [facility]  [address] [id number]       [address]       [place], [place]  [address]       Ph: [phone] or [phone]       Fax: [phone]    RxID:   [id number]  ]

## 2010-07-27 NOTE — Progress Notes (Signed)
Summary: ? re cx   Phone Note Call from Patient Call back at Home Phone [phone]   Caller: Patient Call For: Arlyce Dice Reason for Call: Talk to Nurse Summary of Call: Patient wants to cancel her appt for tioday becuase she does not have her copay  will she get charge for same day cx?` Initial call taken by: Tawni Levy,  September 26, 2009 8:13 AM  Follow-up for Phone Call        Returned pts call , explained to her that since she did call and r/s that she would not be charged Follow-up by: Merri Ray CMA Duncan Dull),  September 26, 2009 8:27 AM

## 2010-07-27 NOTE — Progress Notes (Signed)
Summary: Lab Results  Phone Note Outgoing Call   Summary of Call: call pt - blood tests negative other than low vit D level.  I suggest pt take [address] units of vit D x 12 weeks, then 2000 units OTC once daily Initial call taken by: D. Thomos Lemons DO,  April 13, 2009 9:59 PM  Follow-up for Phone Call        attempted to contact patient at [phone], no answer, voice message left to return call Follow-up by: Glendell Docker CMA,  April 14, 2009 11:35 AM  Additional Follow-up for Phone Call Additional follow up Details #1::        patient advised per Dr Artist Pais instructions Additional Follow-up by: Glendell Docker CMA,  April 15, 2009 8:53 AM    New/Updated Medications: VITAMIN D (ERGOCALCIFEROL) 50000 UNIT CAPS (ERGOCALCIFEROL) one by mouth q weekly Prescriptions: VITAMIN D (ERGOCALCIFEROL) 50000 UNIT CAPS (ERGOCALCIFEROL) one by mouth q weekly  #12 x 0   Entered and Authorized by:   D. Thomos Lemons DO   Signed by:   D. Thomos Lemons DO on 04/13/2009   Method used:   Electronically to        [facility] (retail)       [address]       [place], [place]  [address]       Ph: [phone]       Fax: [phone]   RxID:   [id number]

## 2010-07-27 NOTE — Letter (Signed)
   [place] [facility] [address] [place], [place]  [address]  [place] Phone: [phone]      February 12, 2008   [facility] [address] [place], [place] [address]  RE:  LAB RESULTS  Dear  Ms. Schwimmer,  The following is an interpretation of your most recent lab tests.  Please take note of any instructions provided or changes to medications that have resulted from your lab work.  ELECTROLYTES:  Good - no changes needed  KIDNEY FUNCTION TESTS:  Good - no changes needed  LIVER FUNCTION TESTS:  Stable - no changes needed  LIPID PANEL:  Abnormal - schedule a follow-up appointment Triglyceride: 110   Cholesterol: 209   LDL: DEL   HDL: 32.2   Chol/HDL%:  6.5 CALC    CBC:  Fair - review at your next visit   Please make sure you are taking your Crestor on a regular basis.   I will further discuss your labs at your follow up appointment.     Sincerely Yours,    Dr. Thomos Lemons

## 2010-07-27 NOTE — Assessment & Plan Note (Signed)
Summary: 2 MONTH FOLLOW UP/[facility] FROM BUMP/[facility]   Vital Signs:  Patient profile:   50 year old [name] Height:      64 inches Weight:      226 pounds BMI:     38.93 O2 Sat:      100 % on Room air Temp:     97.8 degrees F oral Pulse rate:   60 / minute Pulse rhythm:   regular Resp:     18 per minute BP sitting:   142 / 80  (left arm) Cuff size:   large  Vitals Entered By: Glendell Docker CMA (November 23, 2009 8:30 AM)  O2 Flow:  Room air CC: Rm 2- 2 Month Follow up  Is Patient Diabetic? No   Primary Care Provider:  DThomos Lemons DO  CC:  Rm 2- 2 Month Follow up .  History of Present Illness: [name] for f/u. vomiting and diarrhea resolved, nausea still present zegerid did not help.  she stopped taking zofran helps nausea  Allergies: 1)  Ace Inhibitors  Past History:  Past Medical History: CHEST PAIN, ATYPICAL (ICD-786.59) HYPERLIPIDEMIA (ICD-272.2)  HYPERTENSION (ICD-401.9)   GERD (ICD-530.Denise)  DIABETES MELLITUS, TYPE II BORDERLINE (ICD-790.29)   CONSTIPATION (ICD-564.00) GASTROPARESIS (ICD-536.3)   ANEMIA-IRON DEFICIENCY (ICD-280.9)   ESOPHAGEAL STRICTURE (ICD-530.3)  ALLERGIC RHINITIS (ICD-477.9)   MIGRAINE HEADACHE (ICD-346.90) ENDOMETRIOSIS (ICD-617.9) OVARIAN CYST (ICD-620.2) PEPTIC ULCER DISEASE (ICD-533.90)    Non compliance  Past Surgical History: Exploratory Laparotomy w/ BSO (2003)  Lt. Knee (2005)     Hysterectomy   Cholecystectomy  Cardiac cath 2009 - mild non obstructive CAD  Family History: Mother is age 58 with hypertension.  Father is 63 and also has hypertension, diabetes, noted to have prostate cancer.  She has a grandmother and sister that are diabetic.  No FH of Colon Cancer: Family History of Kidney Disease:Father          Social History: Married  Has 2 grown children.  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  Former Smoker Quit tobacco in 1996.  She was a pack a day smoker for approximately 10 years.  Alcohol  use-yes: Social  Daily Caffeine Use:6 pack of pepsi daily   Illicit Drug Use - no  Patient does not get regular exercise.     Physical Exam  General:  alert, well-developed, and well-nourished.   Mouth:  pharynx pink and moist.   Lungs:  normal respiratory effort and normal breath sounds.   Heart:  normal rate, regular rhythm, and no gallop.   Abdomen:  soft.  mild epigastric tenderness,  no masses Extremities:  No lower extremity edema' Psych:  normally interactive and good eye contact.     Impression & Recommendations:  Problem # 1:  NAUSEA (ICD-787.02) presumed secondary to gastroparesis.  use zofran as needed  Her updated medication list for this problem includes:    Ondansetron Hcl 4 Mg Tabs (Ondansetron hcl) ..... One by mouth two times a day as needed for nausea  Problem # 2:  HYPERTENSION (ICD-401.9) meds restarted.  she has hypokalemia.  start potassium supplement  Her updated medication list for this problem includes:    Diovan Hct 160-12.5 Mg Tabs (Valsartan-hydrochlorothiazide) .[facility]... Take 1 tablet by mouth once a day  BP today: 142/80 Prior BP: 140/90 (11/01/2009)  Labs Reviewed: K+: 3.7 (11/01/2009) Creat: : 0.77 (11/01/2009)   Chol: 206 (05/06/2009)   HDL: 48.70 (05/06/2009)   LDL: DEL (02/12/2008)   TG: 93.0 (05/06/2009)  Complete Medication List: 1)  Diovan Hct 160-12.5 Mg Tabs (Valsartan-hydrochlorothiazide) .... Take 1 tablet by mouth once a day 2)  Ondansetron Hcl 4 Mg Tabs (Ondansetron hcl) .... One by mouth two times a day as needed for nausea 3)  Klor-con M20 20 Meq Cr-tabs (Potassium chloride crys cr) .... One by mouth once daily  Patient Instructions: 1)  Please schedule a follow-up appointment in 3 months. 2)  BMET:  401.9 3)  Schedule blood work within 2 weeks Prescriptions: DIOVAN HCT 160-12.5 MG TABS (VALSARTAN-HYDROCHLOROTHIAZIDE) Take 1 tablet by mouth once a day  #30 x 3   Entered and Authorized by:   D. Thomos Lemons DO   Signed by:   D.  Thomos Lemons DO on 11/23/2009   Method used:   Electronically to        [facility] (retail)       [address]       [place], [place]  [address]       Ph: [phone]       Fax: [phone]   RxID:   [id number] ONDANSETRON HCL 4 MG TABS (ONDANSETRON HCL) one by mouth two times a day as needed for nausea  #30 x 1   Entered and Authorized by:   D. Thomos Lemons DO   Signed by:   D. Thomos Lemons DO on 11/23/2009   Method used:   Electronically to        [facility] (retail)       [address]       [place], [place]  [address]       Ph: [phone]       Fax: [phone]   RxID:   [id number] KLOR-CON M20 20 MEQ CR-TABS (POTASSIUM CHLORIDE CRYS CR) one by mouth once daily  #30 x 5   Entered and Authorized by:   D. Thomos Lemons DO   Signed by:   D. Thomos Lemons DO on 11/23/2009   Method used:   Electronically to        [facility] (retail)       [address]       [place], [place]  [address]       Ph: [phone]       Fax: [phone]   RxID:   [id number]   Current Allergies (reviewed today): ACE INHIBITORS

## 2010-07-27 NOTE — Progress Notes (Signed)
Summary: TRIAGE   Phone Note Call from Patient Call back at [phone]   Caller: Patient Call For: Dr. Arlyce Dice Reason for Call: Talk to Nurse Summary of Call: Continues w/nausea Initial call taken by: Vallarie Mare,  January 17, 2010 2:36 PM  Follow-up for Phone Call        Pt. c/o nausea, "Just about everyday, mostly after meals." Also bloating,  esophageal burning and a funny taste in her mouth.  Takes Reglan AC & HS, Nexium QAM, Zofran two times a day.  Eats a soft,bland diet.  [facility] PLEASE ADVISE  Follow-up by: Laureen Ochs LPN,  January 17, 2010 3:02 PM  Additional Follow-up for Phone Call Additional follow up Details #1::        Repeat GES, on Reglan. Additional Follow-up by: Louis Meckel MD,  January 17, 2010 3:56 PM  New Problems: ABDOMINAL BLOATING (ICD-787.3)   Additional Follow-up for Phone Call Additional follow up Details #2::    Pt. is scheduled for a GES on Reglan, at [facility] on 02-03-10 at 11am, NPO after 5am.  Pt. instructed to call back as needed.  Follow-up by: Laureen Ochs LPN,  January 17, 2010 4:17 PM  New Problems: ABDOMINAL BLOATING (ICD-787.3)

## 2010-07-27 NOTE — Assessment & Plan Note (Signed)
Summary: SOB/FATIGUE/CHEST PAIN SINCE SATURDAY-PER DARLENE SCHED-STC  Medications Added NEXIUM 40 MG  PACK (ESOMEPRAZOLE MAGNESIUM) one by mouth once daily CRESTOR 20 MG  TABS (ROSUVASTATIN CALCIUM) one by mouth once daily CARAFATE 1 GM  TABS (SUCRALFATE) one by mouth four times ac and at bedtime OXYCODONE-ACETAMINOPHEN 5-325 MG  TABS (OXYCODONE-ACETAMINOPHEN) 1-2 by mouth q6hr as needed AZOR 10-20 MG  TABS (AMLODIPINE-OLMESARTAN) one by mouth once daily        Vital Signs:  Patient Profile:   50 Years Old Female Height:     64.5 inches Weight:      237.13 pounds Temp:     97.7 degrees F oral Pulse rate:   58 / minute BP sitting:   170 / 91  (right arm)  Vitals Entered By: Glendell Docker (May 06, 2007 10:46 AM)                 Chief Complaint:  SEE PHONE NOTE C/O SOB AND FATIGUE .  History of Present Illness: 50 year-old [place] American female, complains of chest pain.  she was seen approximately 1 month ago with similar complaints.  She had previous negative cardiac workup in 2007.  She was sent for a stress echocardiogram, which was reported normal.  She was seen in the ER on May 04, 2007 secondary progressive worsening left-sided chest pain radiating to her left arm.  She also complains of pleuritic left-sided discomfort with deep inhalation.  Pain is worse with side bending and rotating to her left side.  She had associated nausea.  She denies shortness of breath.  ER physician performed workup, including negative cardiac enzymes x 1 and negative D- Dimer.  He felt symptoms were trivial to gastrointestinal abnormality.  Carafate was added to Nexium 40 mg once daily.  She was given prescription for Percocet for p.r.n. use.  She denies any significant improvement from Carafate or narcotic prescription.  She currently rates severity of discomfort is 5 out of 10.  Current Allergies (reviewed today): ! ACE INHIBITORS  Past Medical History:    Hypertension    GERD  Gastroparesis    History of H.Pylori    Microsocpic Anemia    Left knee Chondromalacia    Diabetes Type II       Family History:    Reviewed history and no changes required:  Social History:    Reviewed history and no changes required:    Review of Systems      See HPI   Physical Exam  General:     Well-developed,well-nourished,in no acute distress; alert,appropriate and cooperative throughout examination Neck:     No deformities, masses, or tenderness noted. Chest Wall:     chest wall tenderness and costochondrial tenderness.   Lungs:     Normal respiratory effort, chest expands symmetrically. Lungs are clear to auscultation, no crackles or wheezes. Heart:     Normal rate and regular rhythm. S1 and S2 normal without gallop, murmur, click, rub or other extra sounds. Abdomen:     epigastric and LUQ tenderness.soft, no guarding, and no rigidity.   Extremities:     No clubbing, cyanosis, edema, or deformity noted with normal full range of motion of all joints.   Neurologic:     No cranial nerve deficits noted. Station and gait are normal.  Sensory, motor and coordinative functions appear intact. Skin:     Diffuse papular rash - chronic    Impression & Recommendations:  Problem # 1:  CHEST PAIN, ATYPICAL (ICD-786.59)  Unclear etiology of pt's chest pain.  She describes at pleuritic.  Assoc nausea but no vomiting. D-Dimer normal 11/9.  Recent stress echo was normal.  Consider costochondritis.   Rule out dissection or other pulm pathology - obtain CT of chest with IV contrast today. Doubt pancreatitis - obtain amylase and lipase. Orders: EKG w/ Interpretation (93000) TLB-Amylase (82150-AMYL) TLB-Lipase (83690-LIPASE) Radiology Referral (Radiology)   Problem # 2:  HYPERTENSION (ICD-401.9) Pt has history of poor compliance. Urged pt to take mediciations faithfully.  The following medications were removed from the medication list:    Benicar Hct 40-25 Mg Tabs  (Olmesartan medoxomil-hctz) ..... By mouth once daily    Amlodipine Besy-benazepril Hcl 2.5-10 Mg Caps (Amlodipine besy-benazepril hcl) ..... By mouth once daily    Amlodipine Besylate 5 Mg Tabs (Amlodipine besylate) .Marland Kitchen... Take 1 tablet once a day  Her updated medication list for this problem includes:    Azor 10-20 Mg Tabs (Amlodipine-olmesartan) ..... One by mouth once daily  BP today: 170/91  Labs Reviewed: Creat: 0.8 (04/10/2007) Chol: 265 (04/10/2007)   HDL: 47.5 (04/10/2007)   LDL: DEL (04/10/2007)   TG: 65 (04/10/2007)   Problem # 3:  HYPERLIPIDEMIA (ICD-272.2) I doubt pt is taking crestor regularly. Refill Crestor.  Her updated medication list for this problem includes:    Crestor 20 Mg Tabs (Rosuvastatin calcium) ..... One by mouth once daily  Labs Reviewed: Chol: 265 (04/10/2007)   HDL: 47.5 (04/10/2007)   LDL: DEL (04/10/2007)   TG: 65 (04/10/2007) SGOT: 17 (04/10/2007)   SGPT: 16 (04/10/2007)   Complete Medication List: 1)  Actos 30 Mg Tabs (Pioglitazone hcl) .... By mouth once daily 2)  Nexium 40 Mg Pack (Esomeprazole magnesium) .... One by mouth once daily 3)  Caltrate 600+d 600-400 Mg-unit Tabs (Calcium carbonate-vitamin d) .... By mouth two times a day 4)  Crestor 20 Mg Tabs (Rosuvastatin calcium) .... One by mouth once daily 5)  Zyrtec Allergy 10 Mg Tabs (Cetirizine hcl) .... Take 1 tablet by mouth at bedtime 6)  Carafate 1 Gm Tabs (Sucralfate) .... One by mouth four times ac and at bedtime 7)  Oxycodone-acetaminophen 5-325 Mg Tabs (Oxycodone-acetaminophen) .Marland Kitchen.. 1-2 by mouth q6hr as needed 8)  Azor 10-20 Mg Tabs (Amlodipine-olmesartan) .... One by mouth once daily   Patient Instructions: 1)  Please schedule a follow-up appointment in 3 days. 2)  Stop Benicar 3)  Take Azor 20/10 once daily. 4)  Apply diclofenac gel three times a day    Prescriptions: CRESTOR 20 MG  TABS (ROSUVASTATIN CALCIUM) one by mouth once daily  #30 x 5   Entered and Authorized by:    Dondra Spry DO   Signed by:   Dondra Spry DO on 05/06/2007   Method used:   Elect[facility] to ...       [facility]  [id number]       [address]       [place], [place]  [address]       Ph: [phone] or [phone]       Fax: [phone]   RxID:   [id number] CRESTOR 20 MG  TABS (ROSUVASTATIN CALCIUM) one by mouth once daily  #30 x 5   Entered and Authorized by:   Dondra Spry DO   Signed by:   Dondra Spry DO on 05/06/2007   Method used:   Print then Give to Patient   RxID:   [id number] FIEP  10-20 MG  TABS (AMLODIPINE-OLMESARTAN) one by mouth once daily  #30 x 5   Entered and Authorized by:   Dondra Spry DO   Signed by:   Dondra Spry DO on 05/06/2007   Method used:   Elect[facility]ically [facility] to ...       [facility]  [address] [id number]       [address]       [place], [place]  [address]       Ph: [phone] or [phone]       Fax: [phone]   RxID:   [id number]  ]  Appended Document: Orders Update CT of Chest - no acute findings.  Hiatal hernia with air within esophagus c/w reflux or distal esophageal stricture.  Will arrange follow up with GI re: atypical chest pain.   Clinical Lists Changes  Orders: Added new Referral order of Gastroenterology Referral (GI) - Signed

## 2010-07-27 NOTE — Letter (Signed)
Summary: [facility]  [facility]   Imported By: Maryln Gottron 05/29/2010 10:36:39  _____________________________________________________________________  External Attachment:    Type:   Image     Comment:   External Document

## 2010-07-27 NOTE — Letter (Signed)
[facility]   Imported By: Lanelle Bal 12/29/2009 12:10:35  _____________________________________________________________________  External Attachment:    Type:   Image     Comment:   External Document

## 2010-07-27 NOTE — Progress Notes (Signed)
Summary: breast pain, nausea, diarrhea  Phone Note Call from Patient   Caller: Patient Call For: D. Thomos Lemons DO Summary of Call: Pt called c/o of breast pain since last Wednesday, also has nausea and diarrhea. Scheduled pt appt. to see Dr. Artist Pais today at 1:15pm.  Mervin Kung CMA  Nov 01, 2009 10:25 AM

## 2010-07-27 NOTE — Progress Notes (Signed)
Summary: CBC reminder   Phone Note Outgoing Call Call back at [facility] Phone [phone]   Call placed by: Merri Ray CMA Duncan Dull),  April 22, 2009 11:07 AM Action Taken: Appt scheduled Summary of Call: Called pt to remind her that her follow up CBC is due. Orders in IDX L/M for pt to r/c if she had any questions Initial call taken by: Merri Ray CMA (AAMA),  April 22, 2009 11:10 AM

## 2010-07-27 NOTE — Progress Notes (Signed)
Summary: TRIAGE   Phone Note Call from Patient Call back at Home Phone [phone]   Caller: Patient Reason for Call: Talk to Nurse Details for Reason: TRIAGE Summary of Call: [facility] PROBS. PATIENT'S CHART HAS BEEN REQUESTED Initial call taken by: Guadlupe Spanish [facility],  December 01, 2007 8:48 AM  Follow-up for Phone Call        Dr. Russella Dar you are DOD (doc of the day)  spoke with pt, she is having abd pain and vomiting. had Ct scan ordered by Dr. Artist Pais., Dr. Artist Pais gave pt Carafate and Phenergan and Prilosec, but she states that she is taking all meds a prescribed and not any better. Pain comes mostly when vomiting and she vomits everytime she eats. advised her to stay on clear liquids until she can tolerate then she can increase gradually. Pt wanted to know results of CT I advised her to call Dr. Artist Pais office since he is  the Dr. who ordered them , and I will call pt back after DOD has reviewed triage. Follow-up by: Harlow Mares CMA,  December 01, 2007 9:27 AM  Additional Follow-up for Phone Call Additional follow up Details #1::        Needs office visit today or tomorrow. Any openings for Amy or any MDs? Additional Follow-up by: Meryl Dare MD Clementeen Graham,  December 01, 2007 9:55 AM    Additional Follow-up for Phone Call Additional follow up Details #2::    Left message on patients machine to call back. Harlow Mares CMA  December 01, 2007 10:34 AM Left message on patients machine to call back.  Harlow Mares CMA  December 01, 2007 1:30 pm  Left message on patients machine to call back. Harlow Mares CMA  December 02, 2007 9:39 AM    feeling about the same, still having abd pain and would like someone to see her, she has appt with Dr. Artist Pais 12-10-07. Follow-up by: Harlow Mares CMA,  December 02, 2007 10:27 AM  Additional Follow-up for Phone Call Additional follow up Details #3:: Details for Additional Follow-up Action Taken: Spoke with dr Leone Payor and he will see pt at 3:15pm Additional Follow-up by: Harlow Mares CMA,  December 02, 2007 10:33 AM

## 2010-07-27 NOTE — Letter (Signed)
Summary: Care Consideration Letter/[facility]  Care Consideration Letter/[facility]   Imported By: Lanelle Bal 10/31/2009 11:34:54  _____________________________________________________________________  External Attachment:    Type:   Image     Comment:   External Document

## 2010-07-27 NOTE — Assessment & Plan Note (Signed)
Summary: 2 month follow up/mhf   Vital Signs:  Patient profile:   50 year old female Height:      64 inches Weight:      225 pounds BMI:     38.76 O2 Sat:      100 % on Room air Temp:     98.0 degrees F oral Pulse rate:   62 / minute Pulse rhythm:   regular Resp:     18 per minute BP sitting:   150 / 100  (right arm) Cuff size:   large  Vitals Entered By: Glendell Docker CMA (September 19, 2009 8:59 AM)  O2 Flow:  Room air CC: Rm 3 2 Month Follow up   Primary Care Provider:  Dondra Spry DO  CC:  Rm 3 2 Month Follow up.  History of Present Illness: 50 y/o AA female for follow up pt still c/o unresoved constipation, nausea  with meals constipation not better with lactulose or amitiza  reglan not helping nausea  04/2009  ENDOSCOPIC IMPRESSION:     1) Questionable patchy Inflammation of the small bowel     2) Mild gastritis in the body of the stomach     3) Otherwise normal examination  JEJUNUM, BIOPSY:   BENIGN SMALL BOWEL MUCOSA WITH INTRAMUCOSAL HEMORRHAGE.  NO VILLOUS ATROPHY, INFLAMMATION OR OTHER ABNORMALITIES PRESENT.  Htn - reports chronic dry cough.  she reports taking her meds  Anemia - she has been taking iron on and off for years.  still having issues with anemia  Allergies (verified): 1)  Ace Inhibitors  Past History:  Past Medical History: CHEST PAIN, ATYPICAL (ICD-786.59) HYPERLIPIDEMIA (ICD-272.2)  HYPERTENSION (ICD-401.9)   GERD (ICD-530.81)  DIABETES MELLITUS, TYPE II BORDERLINE (ICD-790.29)   CONSTIPATION (ICD-564.00) GASTROPARESIS (ICD-536.3) ANEMIA-IRON DEFICIENCY (ICD-280.9)   ESOPHAGEAL STRICTURE (ICD-530.3)  ALLERGIC RHINITIS (ICD-477.9)   MIGRAINE HEADACHE (ICD-346.90) ENDOMETRIOSIS (ICD-617.9) OVARIAN CYST (ICD-620.2) PEPTIC ULCER DISEASE (ICD-533.90)    Non compliance  Past Surgical History: Exploratory Laparotomy w/ BSO (2003)  Lt. Knee (2005)    Hysterectomy  Cholecystectomy  Cardiac cath 2009 - mild non  obstructive CAD  Family History: Mother is age 35 with hypertension.  Father is 18 and also has hypertension, diabetes, noted to have prostate cancer.  She has a grandmother and sister that are diabetic.  No FH of Colon Cancer: Family History of Kidney Disease:Father        Social History: Married Has 2 grown children.  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  Former Smoker Quit tobacco in 1996.  She was a pack a day smoker for approximately 10 years.  Alcohol use-yes: Social  Daily Caffeine Use:6 pack of pepsi daily  Illicit Drug Use - no  Patient does not get regular exercise.     Physical Exam  General:  alert and overweight-appearing.   Neck:  Supple; no masses or thyromegaly.no carotid bruits.   Lungs:  normal respiratory effort and normal breath sounds.   Heart:  normal rate, regular rhythm, and no gallop.   Abdomen:  soft and normal bowel sounds.   Extremities:  No lower extremity edema Neurologic:  cranial nerves II-XII intact and gait normal.   Psych:  normally interactive and not anxious appearing.     Impression & Recommendations:  Problem # 1:  HYPERTENSION (ICD-401.9) Pt having chronic dry cough.  change lisinopril to diovan.  The following medications were removed from the medication list:    Lisinopril-hydrochlorothiazide 10-12.5 Mg Tabs (Lisinopril-hydrochlorothiazide) ..... One by mouth  qd Her updated medication list for this problem includes:    Diovan Hct 160-12.5 Mg Tabs (Valsartan-hydrochlorothiazide) ..... One by mouth once daily  BP today: 150/100 Prior BP: 112/74 (08/12/2009)  Labs Reviewed: K+: 3.6 (09/14/2009) Creat: : 0.8 (09/14/2009)   Chol: 206 (05/06/2009)   HDL: 48.70 (05/06/2009)   LDL: DEL (02/12/2008)   TG: 93.0 (05/06/2009)  Problem # 2:  ANEMIA-IRON DEFICIENCY (ICD-280.9) Pt with chronic iron def anemia.  Oral iron likely exacerbating constipation.   refer to heme for IV iron infusions  Orders: Hematology Referral  (Hematology)  Problem # 3:  GASTROPARESIS (ICD-536.3) Assessment: Unchanged still c/o nausea and constipation.  no improvement wit reglan.  f/u with GI.  Complete Medication List: 1)  Gabapentin 100 Mg Caps (Gabapentin) .... Take 2 capsules by mouth at bedtime 2)  Metoclopramide Hcl 10 Mg Tabs (Metoclopramide hcl) .... Take 1 tablet by mouth every 6 hours 3)  Methocarbamol 500 Mg Tabs (Methocarbamol) .... Take 1 tablet by mouth two times a day 4)  Cyclobenzaprine Hcl 10 Mg Tabs (Cyclobenzaprine hcl) .... Take 1 tablet by mouth at bedtime as needed 5)  Milk of (unknown Dosage)  .... Take 2 tablespoons by mouth daily 6)  Anusol-hc 25 Mg Supp (Hydrocortisone acetate) .... Take one suppository per rectum q.h.s. 7)  Nexium 40 Mg Cpdr (Esomeprazole magnesium) .... One by mouth once daily 8)  Fexofenadine Hcl 180 Mg Tabs (Fexofenadine hcl) .... One by mouth once daily 9)  Diovan Hct 160-12.5 Mg Tabs (Valsartan-hydrochlorothiazide) .... One by mouth once daily  Patient Instructions: 1)  Please schedule a follow-up appointment in 2 months. 2)  BMP prior to visit, ICD-9: 401.9 3)  CBC w/ Diff prior to visit, ICD-9: 280.9 4)  HbgA1C prior to visit, ICD-9: 790.29 5)  Please return for lab work one (1) week before your next appointment.  6)  Starting walking program 20-30 mins per day Prescriptions: DIOVAN HCT 160-12.5 MG TABS (VALSARTAN-HYDROCHLOROTHIAZIDE) one by mouth once daily  #30 x 3   Entered and Authorized by:   D. Thomos Lemons DO   Signed by:   D. Thomos Lemons DO on 09/19/2009   Method used:   Electronically to        [facility]       [address]       [place], [place]  [address]       Ph: [phone]       Fax: [phone]   RxID:   [id number] FEXOFENADINE HCL 180 MG TABS (FEXOFENADINE HCL) one by mouth once daily  #30 x 5   Entered and Authorized by:   D. Thomos Lemons DO   Signed by:   D. Thomos Lemons DO on 09/19/2009   Method used:    Electronically to        [facility]       [address]       [place], [place]  [address]       Ph: [phone]       Fax: [phone]   RxID:   [id number] NEXIUM 40 MG CPDR (ESOMEPRAZOLE MAGNESIUM) one by mouth once daily  #30 x 5   Entered and Authorized by:   D. Thomos Lemons DO   Signed by:   D. Thomos Lemons DO on 09/19/2009   Method used:   Electronically to  [facility]       [address]       [place], [place]  [address]       Ph: [phone]       Fax: [phone]   RxID:   [id number]   Current Allergies (reviewed today): ACE INHIBITORS

## 2010-07-27 NOTE — Progress Notes (Signed)
Summary: chest pain  Medications Added AMLODIPINE BESYLATE 5 MG TABS (AMLODIPINE BESYLATE) Take 1 tablet once a day LIPITOR 20 MG TABS (ATORVASTATIN CALCIUM) Take 1 tablet by mouth at bedtime ZYRTEC ALLERGY 10 MG TABS (CETIRIZINE HCL) Take 1 tablet by mouth at bedtime       Phone Note Call from Patient   Summary of Call: Pt is in office now, Called also this am. Pt has had SOB, fatigue, chest pain since saturday. She went to ER Sunday and was given Nexium, Carafate & oxycodone. Pt says her chest pain is stabbing pain on left side w/pain down arm & up neck. She feels more SOB & fatigued on exertion. Pt BP this am is 140/90  Initial call taken by: Lamar Sprinkles,  May 06, 2007 9:56 AM  Follow-up for Phone Call        Pt seen in office Follow-up by: D Thomos Lemons DO,  May 06, 2007 1:12 PM    New/Updated Medications: AMLODIPINE BESYLATE 5 MG TABS (AMLODIPINE BESYLATE) Take 1 tablet once a day LIPITOR 20 MG TABS (ATORVASTATIN CALCIUM) Take 1 tablet by mouth at bedtime ZYRTEC ALLERGY 10 MG TABS (CETIRIZINE HCL) Take 1 tablet by mouth at bedtime

## 2010-07-27 NOTE — Consult Note (Signed)
Summary: [facility]  [facility]   Imported By: Lanelle Bal 05/30/2009 08:32:19  _____________________________________________________________________  External Attachment:    Type:   Image     Comment:   External Document

## 2010-07-27 NOTE — Procedures (Signed)
Summary: Endoscopy   EGD  Procedure date:  01/08/2008  Findings:      Location: [facility]    Patient Name: [name], [name]. MRN: [id number] Procedure Procedures: Panendoscopy (EGD) CPT: 43235.    with biopsy(s)/brushing(s). CPT: D1846139.  Personnel: Endoscopist: Barbette Hair. Arlyce Dice, MD.  Indications Symptoms: Abdominal pain,  History  Current Medications: Patient, Patient is not currently taking Coumadin.  Comments: Patient history reviewed and updated, pre-procedure physical performed prior to initiation of sedation? Pre-Exam Physical: Performed Jan 08, 2008  Cardio-pulmonary exam, Cardio-pulmonary exam, HEENT exam, HEENT exam, Abdominal exam, Abdominal exam, Mental status exam WNL.  Comments: Patient history reviewed and updated, pre-procedure physical performed prior to initiation of sedation?yes Exam Exam Info: Maximum depth of insertion Duodenum, intended Duodenum. Vocal cords, Vocal cords visualized. Gastric retroflexion, Gastric retroflexion performed. ASA Classification: II. Tolerance: good.  Sedation Meds: Patient assessed and found to be appropriate for moderate (conscious) sedation. Monitored Anesthesia Care. Fentanyl given IV. Versed given IV. Cetacaine Spray 2 sprays given aerosolized.  Monitoring: BP and pulse monitoring, BP and pulse monitoring done. Oximetry used., Oximetry used. Supplemental O2 given Supplemental O2 given at 2 Liters. at 2 Liters.  Findings - Normal: Proximal Esophagus to Duodenal Bulb.  NODULE: Maximum size: 2 mm. submucosal nodule in Duodenal Apex. ICD9: Neoplasia, Benign, GI Tract, NEC/NOS: 211.9. Comments: Multiple submucosal nodule on folds throughout the post bulbar duodenum.  NODULE: submucosal nodule in Duodenal Apex. ICD9: Neoplasia, Benign, GI Tract, NEC/NOS: 211.9.  NODULE: submucosal nodule in Duodenal 2nd Portion. ICD9: Neoplasia, Benign, GI Tract, NEC/NOS: 211.9.  NODULE: submucosal nodule in Duodenal Apex.  ICD9: Neoplasia, Benign, GI Tract, NEC/NOS: 211.9.  NODULE: submucosal nodule in Duodenal 2nd Portion. ICD9: Neoplasia, Benign, GI Tract, NEC/NOS: 211.9.   Assessment Abnormal examination, see findings above.  Diagnoses: 211.9: Neoplasia, Benign, GI Tract, NEC/NOS.   Events  Unplanned Intervention: No unplanned interventions were required.  Unplanned Events: , There were no complications. Plans Medication(s): Await pathology. Other: Hyomax 0.375 BID, starting Jan 08, 2008   Disposition: After procedure patient sent to recovery. After recovery patient sent home.  Scheduling: Office Visit, to [employer]. Arlyce Dice, MD, around Jan 22, 2008.      This report was created from the original endoscopy report, which was reviewed and signed by the above listed endoscopist.

## 2010-07-27 NOTE — Miscellaneous (Signed)
  Clinical Lists Changes  Medications: Removed medication of ROBINUL-FORTE 2 MG  TABS (GLYCOPYRROLATE) Take 1 tablet by mouth two times a day Added new medication of HYOSCYAMINE SULFATE CR 0.375 MG  XR12H-TAB (HYOSCYAMINE SULFATE) 1 tab bid - Signed Rx of HYOSCYAMINE SULFATE CR 0.375 MG  XR12H-TAB (HYOSCYAMINE SULFATE) 1 tab bid;  #25 x 5;  Signed;  Entered by: Louis Meckel MD;  Authorized by: Louis Meckel MD;  Method used: Electronic    Prescriptions: HYOSCYAMINE SULFATE CR 0.375 MG  XR12H-TAB (HYOSCYAMINE SULFATE) 1 tab bid  #25 x 5   Entered and Authorized by:   Louis Meckel MD   Signed by:   Louis Meckel MD on 01/08/2008   Method used:   [facility] ...       [facility]  [address] [facility] [id number]       [address]       [place], [place]  [address]       Ph: [phone] or [phone]       Fax: [phone]   RxID:   [id number]

## 2010-07-27 NOTE — Progress Notes (Signed)
Summary: TRIAGE -STOMACH PAIN   Phone Note Call from Patient Call back at Home Phone [phone]   Caller: Patient Call For: KAPLAN Reason for Call: Refill Medication Summary of Call: ST STATES THAT HER STOMACH STILL HURTING REAL BAD AND SHE IS STILL VOMITTING ...PHENERGAN NOT HELPING Initial call taken by: Tawni Levy,  December 29, 2007 9:08 AM  Follow-up for Phone Call        Left message on patients machine to call back. Harlow Mares CMA  December 29, 2007 10:48 AM Left message on patients machine to call back. Harlow Mares CMA  December 29, 2007 2:39 PM  [place] left for patient to callback. Laureen Ochs LPN  December 29, 7844 10:38 AM    Additional Follow-up for Phone Call Additional follow up Details #1::        C/O epigastric pain,cramping and sharp pains for 1 week. N/V for 2-3 monthes. Also having more frequent episodes of diarrhea.No blood, black stools or fever. Takes: Prevacid daily,Robinul Forte two times a day, Phenergan PRN and Reglan 10mg  QID. following a mostly liquid diet for the last several days. 1) Appt. with Dr.Kaplan on 01-01-08 at 11am. 2) Continue above meds. 3) Pt. instructed to call back as needed.    Additional Follow-up by: Laureen Ochs LPN,  December 31, 9627 8:51 AM

## 2010-07-27 NOTE — Letter (Signed)
Summary: [facility]   Imported By: Lanelle Bal 12/24/2008 12:59:38  _____________________________________________________________________  External Attachment:    Type:   Image     Comment:   External Document

## 2010-07-27 NOTE — Letter (Signed)
Summary:  Cancer Center  [facility]   Imported By: Lanelle Bal 03/21/2010 09:19:09  _____________________________________________________________________  External Attachment:    Type:   Image     Comment:   External Document

## 2010-07-27 NOTE — Assessment & Plan Note (Signed)
Summary: hosp f/u.em    History of Present Illness Visit Type: Initial Visit Primary GI MD: Melvia Heaps MD [facility] Primary Constant [place]: Thomos Lemons, D.O. Chief Complaint: f/u cholecystectomy/ lesion on liver  Ms. Fray has returned following laparoscopic cholecystectomy for acalculous cholecystitis.  Postoperatively she had abnormal liver tests characterized by minor elevation of alkaline phosphatase to 150-180 and transaminases one half times normal.  It is noteworthy that she has had abnormal liver tests in the past.  She also has a microcytic anemia.  She denies melena or hematochezia.  Since her discharge she is felt well except for some mild postoperative pain.  CT Scan also demonstrated a hemangioma in the liver.   GI Review of Systems      Denies abdominal pain, acid reflux, belching, bloating, chest pain, dysphagia with liquids, dysphagia with solids, heartburn, loss of appetite, nausea, vomiting, vomiting blood, weight loss, and  weight gain.        Denies anal fissure, black tarry stools, change in bowel habit, constipation, diarrhea, diverticulosis, fecal incontinence, heme positive stool, hemorrhoids, irritable bowel syndrome, jaundice, light color stool, liver problems, rectal bleeding, and  rectal pain.    Current Medications (verified): 1)  Crestor 20 Mg  Tabs (Rosuvastatin Calcium) .... One By Mouth Once Daily 2)  Amlodipine Besylate 10 Mg  Tabs (Amlodipine Besylate) .... One By Mouth Once Daily 3)  Benicar 20 Mg  Tabs (Olmesartan Medoxomil) .... One By Mouth Qd 4)  Clinoril 200 Mg Tabs (Sulindac) .... One Tab B.i.d. For 5 Days Then P.r.n.  Allergies (verified): 1)  ! Ace Inhibitors  Past History:  Past Medical History:    Reviewed history from 10/25/2008 and no changes required:    Hypertension    GERD    Gastroparesis    History of H.Pylori    Microsocpic Anemia    Left knee Chondromalacia    Diabetes Type II    Esophageal Stricture S/P dilation    Medical non  compliance    ABDOMINAL PAIN, LEFT UPPER QUADRANT (ICD-789.02)     MIGRAINE HEADACHE (ICD-346.90)    ENDOMETRIOSIS (ICD-617.9)    OVARIAN CYST (ICD-620.2)    PEPTIC ULCER DISEASE (ICD-533.90)    HIATAL HERNIA (ICD-553.3)    DYSLIPIDEMIA (ICD-272.4)    HYPERLIPIDEMIA (ICD-272.2)    HYPERTENSION (ICD-401.9)    CHEST PAIN, ATYPICAL (ICD-786.59)    CHRONIC CONSTIPATION,NEGATIVE COLON3/06    IRON DEFICIENCY ANEMIA    RIGHT HEPATIC LOBE HEMANGIOMA  Past Surgical History:    Exploratory Laparotomy w/ BSO (2003)    Lt. Knee (2005)     Hysterectomy    Cholecystectomy  Family History:    Mother is age 23 with hypertension.  Father is 20 and also has hypertension, diabetes, noted to have prostate cancer.  She has a grandmother and sister that are diabetic.     No FH of Colon Cancer:    Family History of Kidney Disease:Father   Social History:    Married    Has 2 grown children.  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     Former Smoker Quit tobacco in 1996.  She was a pack a day smoker for approximately 10 years.     Alcohol use-yes: Social    Daily Caffeine Use:6 pack of pepsi daily     Illicit Drug Use - no    Patient does not get regular exercise.     Drug Use:  no    Does Patient Exercise:  no  Review of Systems  The patient complains of heart murmur, muscle pains/cramps, and night sweats.    Vital Signs:  Patient profile:   50 year old female Height:      64.5 inches Weight:      224.13 pounds BMI:     38.01 BSA:     2.07 Pulse rate:   72 / minute Pulse rhythm:   regular BP sitting:   136 / 82  (right arm)  Vitals Entered By: Milford Cage CMA (Nov 11, 2008 10:25 AM)   Impression & Recommendations:  Problem # 1:  ABDOMINAL PAIN-RUQ (ICD-789.01) Assessment Improved Her abdominal pain probably was due to acholecystitis which is obviously improved  following surgery  Problem # 2:  ANEMIA-IRON DEFICIENCY (ICD-280.9)  Etiology for this has not been clarified.   Endoscopy is negative for GI bleeding source.  Recommendations #1 serial Hemoccults #2 colonoscopy #3 hemoglobin electrophoresis  Orders: T- * Misc. Laboratory test [phone]) TLB-GGT (Gamma GT) ([id number])  Problem # 3:  LIVER HEMANGIOMA (ICD-228.04) Assessment: Comment Only  Problem # 4:  NONSPECIFIC ABNORMAL RESULTS LIVR FUNCTION STUDY (ICD-794.8)  Orders: T- * Misc. Laboratory test [phone]) TLB-GGT (Gamma GT) ([id number])  Problem # 5:  GASTROPARESIS (ICD-536.3) Assessment: Improved  Problem # 6:  ESOPHAGEAL STRICTURE (ICD-530.3) Assessment: Improved

## 2010-07-27 NOTE — Letter (Signed)
Summary: Generic Letter  [facility]  [address] [place], [place] [address]   Phone: [phone]  Fax: [phone]    03/11/2009         [facility] [address] [place], [place]  [address]  Dear Ms. Lorio,   You have an appoinment with Dr Arlyce Dice on Thursday 03-24-09 3:30pm.  If you are unable to make this appoinment please proide 24 hour notice   of cancellation to avoid fee.        Sincerely,  [employer]

## 2010-07-27 NOTE — Letter (Signed)
Summary: Appointment - Missed  [facility], Main Office  [address]   [place], [place] [address]   Phone: [phone]  Fax: [phone]     March 07, 2009 MRN: [id number]   [facility] [address] [place], [place]  [address]   Dear Ms. Weigand,  Our records indicate you missed your appointment on February 08, 2009 with Dr. Clifton James. It is very important that we reach you to reschedule this appointment. We look forward to participating in your health care needs. Please contact us at the number listed above at your earliest convenience to reschedule this appointment. Any scheduler answering the phone will be able to help you with this appointment.     Sincerely,  Lela Laverta [place] Scheduling Team

## 2010-07-27 NOTE — Progress Notes (Signed)
Summary: TRIAGE   Phone Note Call from Patient Call back at [phone]   Caller: Patient Call For: Dr. Arlyce Dice Reason for Call: Talk to Nurse Summary of Call: vomiting, diarrhea, abd pain, chest pain radiating through to back... all symptoms started yesterday...  Initial call taken by: Vallarie Mare,  Oct 28, 2009 8:11 AM  Follow-up for Phone Call        X3 days, worse since yesterday, vomiting, watery stools-3 yesterday,  epigastric pain that goes to her back and up around her neck, leg pain.  Denies fever, blood, black stools.  Takes Nexium daily, Reglan QID.  Declines an appt., "I don't have any money"  1) Clear liquids x24 hours. Advance to soft,bland diet x2-4 days. Advanced as tolerated. 2) Immodium as needed for diarrhea. 3) tylenol/Ibuprofen as needed 4) Call your Cardiologist ad advise him of chest/neck pain.  5) If symptoms become worse call back immediately or go to ER. 6) I will call pt., if new orders, after MD reviews.  Follow-up by: Laureen Ochs LPN,  Oct 29, 4538 8:45 AM  Additional Follow-up for Phone Call Additional follow up Details #1::        ok Additional Follow-up by: Louis Meckel MD,  Oct 28, 2009 9:27 AM

## 2010-07-27 NOTE — Assessment & Plan Note (Signed)
Summary: WORSENING CONSTIPATION/ER ON 08-03-09         [facility] 2:15 appt    History of Present Illness Primary GI MD: Melvia Heaps MD [facility] Primary Provider: Thomos Lemons, MD Chief Complaint: Patient c/o 2 week constipation as well as generalized abdominal pain . She denies any dark black stool but does c/o brbpr with bowel movements. She denies rectal pain. History of Present Illness:   [name] has returned for evaluation of constipation and rectal bleeding.  Over the past 2 months she has been complaining of constipation.  This  has been a problem in the past.  She has not responded to lactulose.  She has also had lower bowel pain as well.  In December, 2010 a CT scan of the abdomen and pelvis was negative.  She has a history of an iron deficiency anemia of unknown etiology.  Colonoscopy in July, 2010 demonstrated small adenomatous polyps.   GI Review of Systems    Reports abdominal pain, belching, bloating, heartburn, and  nausea.     Location of  Abdominal pain: generalized.    Denies acid reflux, chest pain, dysphagia with liquids, dysphagia with solids, loss of appetite, vomiting, vomiting blood, weight loss, and  weight gain.      Reports change in bowel habits, constipation, hemorrhoids, and  rectal bleeding.     Denies anal fissure, black tarry stools, diarrhea, diverticulosis, fecal incontinence, heme positive stool, irritable bowel syndrome, jaundice, light color stool, liver problems, and  rectal pain.    Allergies (verified): No Known Drug Allergies  Past History:  Past Medical History: Reviewed history from 07/20/2009 and no changes required. CHEST PAIN, ATYPICAL (ICD-786.59) HYPERLIPIDEMIA (ICD-272.2)  HYPERTENSION (ICD-401.9)  GERD (ICD-530.81)  DIABETES MELLITUS, TYPE II BORDERLINE (ICD-790.29)   CONSTIPATION (ICD-564.00) GASTROPARESIS (ICD-536.3) ANEMIA-IRON DEFICIENCY (ICD-280.9)   ESOPHAGEAL STRICTURE (ICD-530.3)  ALLERGIC RHINITIS (ICD-477.9)   MIGRAINE  HEADACHE (ICD-346.90) ENDOMETRIOSIS (ICD-617.9) OVARIAN CYST (ICD-620.2) PEPTIC ULCER DISEASE (ICD-533.90)    Non compliance  Past Surgical History: Reviewed history from 07/20/2009 and no changes required. Exploratory Laparotomy w/ BSO (2003)  Lt. Knee (2005)   Hysterectomy  Cholecystectomy  Cardiac cath 2009 - mild non obstructive CAD  Family History: Reviewed history from 07/20/2009 and no changes required. Mother is age 83 with hypertension.  Father is 81 and also has hypertension, diabetes, noted to have prostate cancer.  She has a grandmother and sister that are diabetic.  No FH of Colon Cancer: Family History of Kidney Disease:Father       Social History: Reviewed history from 07/20/2009 and no changes required. Married Has 2 grown children.  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  Former Smoker Quit tobacco in 1996.  She was a pack a day smoker for approximately 10 years.  Alcohol use-yes: Social Daily Caffeine Use:6 pack of pepsi daily  Illicit Drug Use - no  Patient does not get regular exercise.     Review of Systems       The patient complains of anemia, arthritis/joint pain, back pain, change in vision, and cough.  The patient denies allergy/sinus, anxiety-new, blood in urine, breast changes/lumps, confusion, coughing up blood, depression-new, fainting, fatigue, fever, headaches-new, hearing problems, heart murmur, heart rhythm changes, itching, menstrual pain, muscle pains/cramps, night sweats, nosebleeds, pregnancy symptoms, shortness of breath, skin rash, sleeping problems, sore throat, swelling of feet/legs, swollen lymph glands, thirst - excessive , urination - excessive , urination changes/pain, urine leakage, vision changes, and voice change.    Vital Signs:  Patient  profile:   50 year old female Height:      64 inches Weight:      225 pounds BMI:     38.76 BSA:     2.06 Pulse rate:   92 / minute Pulse rhythm:   regular BP sitting:   112 / 74  (right  arm)  Vitals Entered By: [name] CMA [name]) (August 12, 2009 2:31 PM)  Physical Exam  Additional Exam:  N. physical exam she is healthy-appearing female  Abdomen is without masses,  tenderness, or  organomegaly   Impression & Recommendations:  Problem # 1:  CONSTIPATION (ICD-564.00) Assessment Deteriorated She likely has functional constipation.  Recommendations #1 trial of Amitiza 24 micrograms twice a day  Problem # 2:  RECTAL BLEEDING (ICD-569.3) This most likely is related to hemorrhoids.  Recommendations #1 Anusol a.c. suppositories Prescriptions: ANUSOL-HC 25 MG SUPP (HYDROCORTISONE ACETATE) take one suppository per rectum q.h.s.  #7 x 2   Entered and Authorized by:   Denise Meckel MD   Signed by:   Denise Meckel MD on 08/12/2009   Method used:   Electronically to        [facility] (retail)       [address]       [place], [place]  [address]       Ph: [phone]       Fax: [phone]   RxID:   [id number]

## 2010-07-27 NOTE — Letter (Signed)
Summary: Pre Visit No Show Letter  [facility]  [address] [place], [place] [address]   Phone: [phone]  Fax: [phone]        December 21, 2008 MRN: [id number]    [facility] [address] [place], [place]  [address]    Dear Ms. Inglett,   We have been unable to reach you by phone concerning the pre-procedure visit that you missed on____06/29/2010_ For this reason,your procedure scheduled on__07/07/2010_has been cancelled. Our scheduling staff will gladly assist you with rescheduling your appointments at a more convenient time. Please call our office at [phone] between the hours of 8:00am and 5:00pm, press option #2 to reach an appointment scheduler. Please consider updating your contact numbers at this time so that we can reach you by phone in the future with schedule changes or results.    Thank you,    Alease Frame RN [facility]

## 2010-07-27 NOTE — Miscellaneous (Signed)
Summary: chart update  Clinical Lists Changes  Echocardiogram  Procedure date:  02/24/2009  Findings:       Study Conclusions    1. Left ventricle: The cavity size was normal. Systolic function was      normal. The estimated ejection fraction was in the range of 60%      to 65%. Doppler parameters are consistent with abnormal left      ventricular relaxation (grade 1 diastolic dysfunction).   2. Left atrium: The atrium was mildly dilated.   3. Pericardium, extracardiac: A trivial pericardial effusion was      identified posterior to the heart.   Transthoracic echocardiography. M-mode, complete 2D, spectral   Doppler, and color Doppler. Height: Height: 162.5cm. Height: 64in.   Weight: Weight: 104kg. Weight: 228.8lb. Body mass index: BMI:   39.4kg/m^2. Body surface area: BSA: 2.28m^2. Patient status:   Inpatient. Location: Bedside.   CXR  Procedure date:  03/06/2009  Findings:       Clinical Data: Chest pain    CHEST - 2 VIEW    Comparison: 02/21/2009    Findings: Low lung volumes and bibasilar atelectasis.  Heart is   upper normal in size allowing for low lung volumes.  No   pneumothorax.  No pleural effusion.    IMPRESSION:   Bibasilar atelectasis and low lung volumes.    Read By:  Jolaine Click,  M.D.   Released By:  Jolaine Click,  M.D.

## 2010-07-27 NOTE — Miscellaneous (Signed)
Summary: IRON DEF. ANEMIA, HEM + STOOL            [facility]  Patient here today for capsule endoscopy for Dr. Arlyce Dice .  Pt verbalized understanding of all verbal and written instructions.  Pt tolerated well.  Lot #  [id number]  exp 2011-08 .

## 2010-07-27 NOTE — Procedures (Signed)
Summary: Capsule Endo Report / [facility]  Capsule Endo Report / [facility]   Imported By: Lennie Odor 02/08/2009 11:49:04  _____________________________________________________________________  External Attachment:    Type:   Image     Comment:   External Document

## 2010-07-27 NOTE — Assessment & Plan Note (Signed)
Summary: abd pain and nausea/triage/lk   History of Present Illness Visit Type: follow up Primary GI MD: Melvia Heaps MD [facility] Primary Provider: Thomos Lemons, D.O. Chief Complaint: abdominal pain with nausea x 1 mo and some vomiting/diarrhea History of Present Illness:   Called [name] c/o increasing abdominal pain and nausea and vomiting. Has had chronic problems in past, intermittent. Has delayed gastric emptying in fitrst hour of gastric emptying study and has not responded tro metaclopramide, erythromycin. Amitiza has not helped constipation, neither has MiraLax or Amitiza.   tLUQ pain was intermittent, now constant. Sharp, pressure and crampy at different times, always a pressure. Has vomited before and after eating. Vomited 2 times this AM, clear fluid, no blood. No fever or chills. No dysphagia, occasional heartburn. No dysphagia since 3/08 EGD and esophageal dilation. Stools are somewhat loose, x few days. Usually constipated. Promethazine minimally helpful and has run out.  Recent CT Abd/Pelvis unrevealing.           Prior Medications Reviewed Using: Patient Recall  Prior Medication List:  ACTOS 30 MG  TABS (PIOGLITAZONE HCL) by mouth once daily PRILOSEC 20 MG  CPDR (OMEPRAZOLE) one by mouth two times a day before meals CALTRATE 600+D 600-400 MG-UNIT  TABS (CALCIUM CARBONATE-VITAMIN D) by mouth two times a day CRESTOR 20 MG  TABS (ROSUVASTATIN CALCIUM) one by mouth once daily ZYRTEC ALLERGY 10 MG TABS (CETIRIZINE HCL) Take 1 tablet by mouth at bedtime CARAFATE 1 GM  TABS (SUCRALFATE) one by mouth four times ac and at bedtime OXYCODONE-ACETAMINOPHEN 5-325 MG  TABS (OXYCODONE-ACETAMINOPHEN) 1-2 by mouth q6hr as needed AMLODIPINE BESYLATE 5 MG  TABS (AMLODIPINE BESYLATE) one by mouth once daily BENICAR 20 MG  TABS (OLMESARTAN MEDOXOMIL) one by mouth qd PROMETHAZINE HCL 12.5 MG  TABS (PROMETHAZINE HCL) one by mouth q12 hrs as needed nausea   Current Allergies (reviewed today): !  ACE INHIBITORS  Past Medical History:    Hypertension    GERD    Gastroparesis    History of H.Pylori    Microsocpic Anemia    Left knee Chondromalacia    Diabetes Type II    Esophageal Stricture S/P dilation    Medical non compliance    ABDOMINAL PAIN, LEFT UPPER QUADRANT (ICD-789.02)    MIGRAINE HEADACHE (ICD-346.90)    ENDOMETRIOSIS (ICD-617.9)    OVARIAN CYST (ICD-620.2)    PEPTIC ULCER DISEASE (ICD-533.90)    HIATAL HERNIA (ICD-553.3)    DYSLIPIDEMIA (ICD-272.4)    HYPERLIPIDEMIA (ICD-272.2)    HYPERTENSION (ICD-401.9)    CHEST PAIN, ATYPICAL (ICD-786.59)    Constipation          Past Surgical History:    Reviewed history from 08/11/2007 and no changes required:       Partial Hysterctomy (1988)       Exploratory Laparotomy w/ BSO (2003)       Lt. Knee (2005)          Family History:    Mother is age 60 with hypertension.  Father is 70 and also has hypertension, diabetes, noted to have prostate cancer.  She has a grandmother and sister that are diabetic.         No FH of Colon Cancer:  Social History:    Married    Has 2 grown children.  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     Former Smoker Quit tobacco in 1996.  She was a pack a day smoker for approximately 10 years.     Alcohol use-yes  Daily Caffeine Use     Vital Signs:  Patient Profile:   50 Years Old Female Height:     64.5 inches Weight:      235.25 pounds BMI:     39.90 BSA:     2.11 Pulse rate:   84 / minute Pulse rhythm:   regular BP sitting:   142 / 80  (right arm)  Vitals Entered By: Milford Cage CMA (December 02, 2007 3:24 PM)                  Physical Exam  General:     obese.   Eyes:      no icterus. Lungs:     Clear throughout to auscultation. Heart:     Regular rate and rhythm; no murmurs, rubs,  or bruits. Abdomen:     tender LUQ, moderate, no guarding no succussion splash BS diminished no HSM Skin:     follicular, pustular rash on upper and lower  extremities    Impression & Recommendations:  Problem # 1:  ABDOMINAL PAIN, LEFT UPPER QUADRANT (ICD-789.02) Assessment: Deteriorated Likely a mix of non-ulcer dyspepsia and abdominal wall pain from vomiting. CT is reassuring and was scoped as recently as last year. Amylase and lipase normal recently.  Really a chronic recurrent problem when you review chart though she does not seem to be aware of that.   Try Robinul. Continue prilosec and Carafate  Keep f/u Dr. Arlyce Dice and Artist Pais.  Orders: TLB-CBC Platelet - w/Differential ([id number])   Problem # 2:  NAUSEA AND VOMITING (ICD-787.01) Assessment: Deteriorated Chronic and recurrent. Delayed gastric emptying in first hour of gastric emptying study. Has some sort of functional GI disturbance, also usually constipated. Has not responded to many therapies as outined in HPI.  ? if domperidone might help her but would defer to primarty gastroenterologist, Dr. Arlyce Dice. Might benefit from evaluation at [facility] re: gastroinestinal motility disturbance.  Will try compazine suppositories. Dramamine might be worth a try also.  Maty be due for HGb A1c follow-up.   Patient Instructions: 1)  Use step 1 of gastroparesis diet and move to step 2 and then 3 as you get better 2)  Keep appointments with Dr. Artist Pais and Dr. Arlyce Dice 3)  Let [name] know if medicines do not help    Prescriptions: ROBINUL-FORTE 2 MG  TABS (GLYCOPYRROLATE) Take 1 tablet by mouth two times a day  #60 x 1   Entered by:   [name] CMA   Authorized by:   Iva Boop MD   Signed by:   [name] CMA on 12/02/2007   Method used:   Electronically sent to ...       [facility]  [id number]       [address]       [place], [place]  [address]       Ph: [phone] or [phone]       Fax: [phone]   RxID:   [id number] PROCHLORPERAZINE 25 MG  SUPP (PROCHLORPERAZINE) insert one suppository into rectum every 12 hours as needed  nausea  #30 x 0   Entered by:   [name] CMA   Authorized by:   Iva Boop MD   Signed by:   [name] CMA on 12/02/2007   Method used:   Electronically sent to ...       [facility]  [address] [id number]       [address]  [place], [place]  [address]       Ph: [phone] or [phone]       Fax: [phone]   RxID:   [id number]  ]

## 2010-07-27 NOTE — Consult Note (Signed)
[facility]   Imported By: Lanelle Bal 10/24/2009 09:40:45  _____________________________________________________________________  External Attachment:    Type:   Image     Comment:   External Document

## 2010-07-27 NOTE — Letter (Signed)
Summary: [facility]: Immunoassay Fecal Occult Blood (iFOB) Order [facility]  [address] [place], [place] [address]   Phone: [phone]  Fax: [phone]      [facility]: Immunoassay Fecal Occult Blood (iFOB) Order Form   Nov 11, 2008 MRN: [id number]   [name] 03-Aug-1960   Physicican Name:Robert Arlyce Dice  Diagnosis Code:_280.9 Anemia_      Melvia Heaps, MD   Merri Ray CMA

## 2010-07-27 NOTE — Assessment & Plan Note (Signed)
Summary: 6 WEEK FOLLOW UP/MHF   Vital Signs:  Patient profile:   50 year old female Weight:      223.50 pounds BMI:     38.50 Temp:     98.4 degrees F oral Pulse rate:   66 / minute Pulse rhythm:   regular Resp:     16 per minute BP sitting:   122 / 80  (left arm) Cuff size:   large  Vitals Entered By: Glendell Docker CMA (May 23, 2009 9:19 AM)  O2 Flow:  Room air  Primary Care Provider:  D. Thomos Lemons DO   History of Present Illness:  Hypertension Follow-Up      This is a 50 year old woman who presents for Hypertension follow-up.  The patient reports headaches, but denies lightheadedness.    Arthralgias - pt seen by Dr. Corliss Skains.  awaiting workup.  Migraine Headaches - pt recently seen in ER for migraine headache.  Pt reports hx of migraines.  She has been getting more freq HAs  Preventive Screening-Counseling & Management  Alcohol-Tobacco     Smoking Status: quit  Allergies (verified): No Known Drug Allergies  Past History:  Past Medical History: CHEST PAIN, ATYPICAL (ICD-786.59) HYPERLIPIDEMIA (ICD-272.2) HYPERTENSION (ICD-401.9)  GERD (ICD-530.81)  DIABETES MELLITUS, TYPE II BORDERLINE (ICD-790.29) HEMOCCULT POSITIVE STOOL (ICD-578.1) NONSPECIFIC ABNORMAL RESULTS LIVR FUNCTION STUDY (ICD-794.8) CONSTIPATION (ICD-564.00) GASTROPARESIS (ICD-536.3) ANEMIA-IRON DEFICIENCY (ICD-280.9) ABDOMINAL PAIN-RUQ (ICD-789.01) LIVER HEMANGIOMA (ICD-228.04)  GASTROPARESIS (ICD-536.3) ESOPHAGEAL STRICTURE (ICD-530.3) ABDOMINAL PAIN, GENERALIZED, CHRONIC (ICD-789.07) ANEMIA, NORMOCYTIC (ICD-285.9) ALLERGIC RHINITIS (ICD-477.9) NAUSEA AND VOMITING (ICD-787.01) ABDOMINAL PAIN, LEFT UPPER QUADRANT (ICD-789.02) MIGRAINE HEADACHE (ICD-346.90) ENDOMETRIOSIS (ICD-617.9) OVARIAN CYST (ICD-620.2) PEPTIC ULCER DISEASE (ICD-533.90) HIATAL HERNIA (ICD-553.3)   Non compliance  Past Surgical History: Exploratory Laparotomy w/ BSO (2003)  Lt. Knee (2005)    Hysterectomy Cholecystectomy  Cardiac cath 2009 - mild non obstructive CAD  Family History: Mother is age 71 with hypertension.  Father is 15 and also has hypertension, diabetes, noted to have prostate cancer.  She has a grandmother and sister that are diabetic.  No FH of Colon Cancer: Family History of Kidney Disease:Father      Social History: Married Has 2 grown children.  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  Former Smoker Quit tobacco in 1996.  She was a pack a day smoker for approximately 10 years.  Alcohol use-yes: Social Daily Caffeine Use:6 pack of pepsi daily  Illicit Drug Use - no Patient does not get regular exercise.     Physical Exam  General:  alert, well-developed, and well-nourished.   Lungs:  normal respiratory effort and normal breath sounds.   Heart:  normal rate, regular rhythm, no murmur, and no gallop.   Extremities:  No lower extremity edema   Impression & Recommendations:  Problem # 1:  HYPERTENSION (ICD-401.9) Assessment Improved Stressed importance of compliance.  Her updated medication list for this problem includes:    Hydrochlorothiazide 25 Mg Tabs (Hydrochlorothiazide) .Marland Kitchen... Take 1 tablet by mouth once a day    Lisinopril 20 Mg Tabs (Lisinopril) .Marland Kitchen... Take 1 tablet by mouth once a day  BP today: 122/80 Prior BP: 125/80 (05/18/2009)  Labs Reviewed: K+: 3.7 (04/05/2009) Creat: : 0.8 (04/05/2009)   Chol: 206 (05/06/2009)   HDL: 48.70 (05/06/2009)   LDL: DEL (02/12/2008)   TG: 93.0 (05/06/2009)  Problem # 2:  MIGRAINE HEADACHE (ICD-346.90) Pt has frequent headaches.  use amitriptyline for headache prophylaxis.  The following medications were removed from the medication list:    Percocet  5-325 Mg Tabs (Oxycodone-acetaminophen) ..... One tablet by mouth every 8 hours as needed pain Her updated medication list for this problem includes:    Aspirin 81 Mg Tbec (Aspirin) .Marland Kitchen... Take 1 tablet by mouth once a day  Problem # 3:  ARTHRALGIA  (ICD-719.40) Pt seen by Dr. Drusilla Kanner.  Awaiting work up results.  Problem # 4:  DIABETES MELLITUS, TYPE II, BORDERLINE (ICD-790.29) Monitor A1c.  Complete Medication List: 1)  Nu-iron 150 Mg Caps (Polysaccharide iron complex) .... Take 1 capsule by mouth two times a day 2)  Hydrochlorothiazide 25 Mg Tabs (Hydrochlorothiazide) .... Take 1 tablet by mouth once a day 3)  Lisinopril 20 Mg Tabs (Lisinopril) .... Take 1 tablet by mouth once a day 4)  Aspirin 81 Mg Tbec (Aspirin) .... Take 1 tablet by mouth once a day 5)  Crestor 10 Mg Tabs (Rosuvastatin calcium) .... One by mouth once daily (hold) 6)  Vitamin D (ergocalciferol) 50000 Unit Caps (Ergocalciferol) .... One by mouth q weekly 7)  Amitriptyline Hcl 25 Mg Tabs (Amitriptyline hcl) .... 1/2 to one tab by mouth qhs  Patient Instructions: 1)  Please schedule a follow-up appointment in 2 months. 2)  BMP prior to visit, ICD-9: 401.9 3)  HbgA1C prior to visit, ICD-9: 790.29 4)  Please return for lab work one (1) week before your next appointment.  Prescriptions: AMITRIPTYLINE HCL 25 MG TABS (AMITRIPTYLINE HCL) 1/2 to one tab by mouth qhs  #30 x 1   Entered and Authorized by:   D. Thomos Lemons DO   Signed by:   D. Thomos Lemons DO on 05/23/2009   Method used:   Electronically to        [facility] (retail)       [address]       [place], [place]  [address]       Ph: [phone]       Fax: [phone]   RxID:   [id number]   Current Allergies (reviewed today): No known allergies

## 2010-07-27 NOTE — Assessment & Plan Note (Signed)
Summary: POST HOSPITAL F/U FOR NON-CARDIAC CHEST PAIN         [name]    History of Present Illness Visit Type: Follow-up Visit Primary GI MD: Melvia Heaps MD [facility] Primary Timoteo Carreiro: Thomos Lemons, DO Chief Complaint: chest pain non-cardiac [name] Has returned for evaluation of abdominal pain.  She was hospitalized about 3 weeks ago with chest pain.  Cardiac cath did not demonstrate significant coronary artery disease.  Initially it was thought that her pain was related to hypertension.  After this was successfully treated pain continued.  A gastric emptying scan showed marked gastric delay.  She has taken hyomax without relief.  Since discharge she continues to have pain  primarily in her left upper quadrant.  It may radiate to her chest.  She complains of bloating and nausea.  Symptoms are worsened with eating and will awaken her.  She denies pyrosis.  She remains on omeprazole.   GI Review of Systems    Reports abdominal pain, chest pain, nausea, and  vomiting.     Location of  Abdominal pain: epigastric area.    Denies acid reflux, belching, bloating, dysphagia with liquids, dysphagia with solids, heartburn, loss of appetite, vomiting blood, weight loss, and  weight gain.        Denies anal fissure, black tarry stools, change in bowel habit, constipation, diarrhea, diverticulosis, fecal incontinence, heme positive stool, hemorrhoids, irritable bowel syndrome, jaundice, light color stool, liver problems, rectal bleeding, and  rectal pain.    Current Medications (verified): 1)  Simvastatin 80 Mg Tabs (Simvastatin) .... 1/2 By Mouth Qpm 2)  Nu-Iron 150 Mg Caps (Polysaccharide Iron Complex) .... Take 1 Capsule By Mouth Two Times A Day 3)  Hydrochlorothiazide 25 Mg Tabs (Hydrochlorothiazide) .... Take 1 Tablet By Mouth Once A Day 4)  Lisinopril 20 Mg Tabs (Lisinopril) .... Take 1 Tablet By Mouth Once A Day 5)  Percocet 5-325 Mg Tabs (Oxycodone-Acetaminophen) .... One Tablet By Mouth  Every 8 Hours As Needed Pain 6)  Omeprazole 20 Mg Tbec (Omeprazole) .... Take 1 Tablet By Mouth Once A Day 7)  Amlodipine Besylate 5 Mg Tabs (Amlodipine Besylate) .... Once Daily 8)  Aspirin 81 Mg Tbec (Aspirin) .... Once Daily  Allergies (verified): No Known Drug Allergies  Past History:  Past Medical History: Reviewed history from 03/01/2009 and no changes required. CHEST PAIN, ATYPICAL (ICD-786.59) HYPERLIPIDEMIA (ICD-272.2) HYPERTENSION (ICD-401.9) GERD (ICD-530.81) DIABETES MELLITUS, TYPE II (ICD-250.00) HEMOCCULT POSITIVE STOOL (ICD-578.1) NONSPECIFIC ABNORMAL RESULTS LIVR FUNCTION STUDY (ICD-794.8) CONSTIPATION (ICD-564.00) GASTROPARESIS (ICD-536.3) ANEMIA-IRON DEFICIENCY (ICD-280.9) ABDOMINAL PAIN-RUQ (ICD-789.01) LIVER HEMANGIOMA (ICD-228.04)  GASTROPARESIS (ICD-536.3) ESOPHAGEAL STRICTURE (ICD-530.3) ABDOMINAL PAIN, GENERALIZED, CHRONIC (ICD-789.07) ANEMIA, NORMOCYTIC (ICD-285.9) ALLERGIC RHINITIS (ICD-477.9) NAUSEA AND VOMITING (ICD-787.01) ABDOMINAL PAIN, LEFT UPPER QUADRANT (ICD-789.02) MIGRAINE HEADACHE (ICD-346.90) ENDOMETRIOSIS (ICD-617.9) OVARIAN CYST (ICD-620.2) PEPTIC ULCER DISEASE (ICD-533.90) HIATAL HERNIA (ICD-553.3)   Non compliance  Past Surgical History: Reviewed history from 03/01/2009 and no changes required. Exploratory Laparotomy w/ BSO (2003) Lt. Knee (2005)  Hysterectomy Cholecystectomy  Cardiac cath 2009 - mild non obstructive CAD  Family History: Reviewed history from 11/11/2008 and no changes required. Mother is age 60 with hypertension.  Father is 43 and also has hypertension, diabetes, noted to have prostate cancer.  She has a grandmother and sister that are diabetic.  No FH of Colon Cancer: Family History of Kidney Disease:Father   Social History: Reviewed history from 11/11/2008 and no changes required. Married Has 2 grown children.  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  Former Smoker Quit  tobacco in 1996.  She was a pack  a day smoker for approximately 10 years.  Alcohol use-yes: Social Daily Caffeine Use:6 pack of pepsi daily  Illicit Drug Use - no Patient does not get regular exercise.   Review of Systems       The patient complains of anemia, arthritis/joint pain, back pain, breast changes/lumps, cough, heart murmur, night sweats, shortness of breath, and sleeping problems.    Vital Signs:  Patient profile:   50 year old female Height:      64.5 inches Weight:      228 pounds BMI:     38.67 Pulse rate:   60 / minute Pulse rhythm:   regular BP sitting:   162 / 86  (left arm) Cuff size:   regular  Vitals Entered By: June McMurray CMA Duncan Dull) (March 24, 2009 4:04 PM)  Physical Exam  Additional Exam:  She is a well-developed well-nourished female  skin: anicteric HEENT: normocephalic; PEERLA; no nasal or pharyngeal abnormalities neck: supple nodes: no cervical lymphadenopathy chest: clear to ausculatation and percussion heart: no murmurs, gallops, or rubs abd: soft, nontender; BS normoactive; no abdominal masses,  organomegaly; there is mild tenderness to palpation in the left upper quadrant that decreases with abdominal wall flexion rectal: deferred ext: no cynanosis, clubbing, edema skeletal: no deformities neuro: oriented x 3; no focal abnormalities    Impression & Recommendations:  Problem # 1:  ABDOMINAL PAIN, LEFT UPPER QUADRANT (ICD-789.02) Assessment Deteriorated Abdominal  pain could be due to gastroparesis.  This would also account for her bloating and nausea.  Recommendations #1 trial of Reglan 10 mg one half hour a.c. and h.s.  Problem # 2:  ANEMIA-IRON DEFICIENCY (ICD-280.9) Etiology was never determined.  Workup included upper and lower endoscopy and capsule endoscopy.  Recommendations #1 continue iron supplementation  Problem # 3:  DIABETES MELLITUS, TYPE II (ICD-250.00) Assessment: Comment Only  Problem # 4:  LIVER HEMANGIOMA (ICD-228.04) Assessment:  Comment Only Prescriptions: REGLAN 10 MG TABS (METOCLOPRAMIDE HCL) take one tab one half hour a.c. and h.s.  #60 x 5   Entered and Authorized by:   Louis Meckel MD   Signed by:   Louis Meckel MD on 03/24/2009   Method used:   Historical   RxID:   [id number]

## 2010-07-27 NOTE — Progress Notes (Signed)
Summary: Capsule Endoscopy Scheduled   Phone Note Outgoing Call   Call placed by: Laureen Ochs LPN,  December 30, 1608 1:03 PM Call placed to: Patient Summary of Call: Pt. needs to be scheduled for a Capsule Endoscopy.  No answer/answering machine. I will try back later.  Initial call taken by: Laureen Ochs LPN,  December 31, 9602 1:03 PM  Follow-up for Phone Call        Message left for patient to callback. Laureen Ochs LPN  January 01, 5408 3:49 PM    Pt. scheduled for Capsule Endoscpy on 01-10-09 at 8am. Instructions reviewed with pt. by phone and mailed to her. Pt. instructed to call back as needed.  Follow-up by: Laureen Ochs LPN,  January 03, 2009 9:37 AM  New Problems: HEMOCCULT POSITIVE STOOL (ICD-578.1)   New Problems: HEMOCCULT POSITIVE STOOL (ICD-578.1)

## 2010-07-27 NOTE — Assessment & Plan Note (Signed)
Summary: 6 WK ROV /NWS $50   Vital Signs:  Patient Profile:   50 Years Old Female Height:     64.5 inches Weight:      229.75 pounds BMI:     38.97 Temp:     98.7 degrees F oral Pulse rate:   68 / minute Resp:     18 per minute BP sitting:   130 / 90  (right arm) Cuff size:   large  Vitals Entered By: Glendell Docker CMA (January 20, 2008 8:04 AM)                 PCP:  Thomos Lemons, D.O.  Chief Complaint:  Follow Up Disease Management.  History of Present Illness:       This is a 50 years old female who presents with Chest pain.  The patient reports resting chest pain and shortness of breath, but denies exertional chest pain, nausea, and vomiting.  The pain is described as intermittent.  The pain is located in the left anterior chest and epigastric area.  Episodes of chest pain last 10-20 minutes.  Pt followed by GI for epigastric pain and gastroparesis.    Current Allergies (reviewed today): ! ACE INHIBITORS  Past Medical History:    Hypertension    GERD    Gastroparesis    History of H.Pylori    Microsocpic Anemia    Left knee Chondromalacia    Diabetes Type II    Esophageal Stricture S/P dilation    Medical non compliance    ABDOMINAL PAIN, LEFT UPPER QUADRANT (ICD-789.02)     MIGRAINE HEADACHE (ICD-346.90)    ENDOMETRIOSIS (ICD-617.9)    OVARIAN CYST (ICD-620.2)    PEPTIC ULCER DISEASE (ICD-533.90)    HIATAL HERNIA (ICD-553.3)    DYSLIPIDEMIA (ICD-272.4)    HYPERLIPIDEMIA (ICD-272.2)    HYPERTENSION (ICD-401.9)    CHEST PAIN, ATYPICAL (ICD-786.59)    Constipation          Past Surgical History:    Partial Hysterctomy (5409)    Exploratory Laparotomy w/ BSO (2003)    Lt. Knee (2005)        Social History:    Married    Has 2 grown children.  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     Former Smoker Quit tobacco in 1996.  She was a pack a day smoker for approximately 10 years.     Alcohol use-yes    Daily Caffeine Use         Review of Systems    See HPI   Physical Exam  General:     alert, well-developed, and well-nourished.   Head:     normocephalic and atraumatic.   Neck:     supple and no masses.   Lungs:     normal respiratory effort and normal breath sounds.   Heart:     normal rate, regular rhythm, no murmur, and no gallop.   Abdomen:     soft, non-tender, no hepatomegaly, and no splenomegaly.   Extremities:     No lower extremity edema  Neurologic:     alert & oriented X3 and cranial nerves II-XII intact.   Skin:     follicular, pustular rash on upper and lower extremities Psych:     normally interactive, good eye contact, not anxious appearing, and not depressed appearing.      Impression & Recommendations:  Problem # 1:  CHEST PAIN, ATYPICAL (ICD-786.59) Pt with intermittent non exertional chest pain.  She  had stress testing in the past - 11/08.  (EF 64% - hypertenisve BP response 170/95.  Low risk adenosine stress nuclear study).   She has multiple risk factors.  EKG shows NSR @ 68 bpm.  Minimal voltage criteria fo LVH.  Refer to cardiology for further evaluation.  Patient advised to call office if symptoms persist or worsen.  Last 2 D Echo :  SUMMARY  04/2007 -  Overall left ventricular systolic function was normal. Left       ventricular ejection fraction was estimated to be 55 %. There       was no diagnostic evidence of left ventricular regional wall       motion abnormalities. Features were consistent with mild       diastolic dysfunction. -  The left atrium was mildly dilated. -  There was a minimal pericardial effusion, without hemodynamic       compromise.   Orders: Cardiology Referral (Cardiology) EKG w/ Interpretation (93[address])   Problem # 2:  HYPERTENSION (ICD-401.9) Assessment: Unchanged Stable.  Maintain current medication regimen.  Her updated medication list for this problem includes:    Amlodipine Besylate 10 Mg Tabs (Amlodipine besylate) ..... One by mouth once daily     Benicar 20 Mg Tabs (Olmesartan medoxomil) ..... One by mouth qd  BP today: 130/90 Prior BP: 120/66 (01/01/2008)  Labs Reviewed: Creat: 0.8 (04/10/2007) Chol: 265 (04/10/2007)   HDL: 47.5 (04/10/2007)   LDL: DEL (04/10/2007)   TG: 65 (04/10/2007)   Problem # 3:  DIABETES MELLITUS, TYPE II (ICD-250.00) Pt off Actos.  She is diet controlled.  Monitor A1c.  The following medications were removed from the medication list:    Actos 30 Mg Tabs (Pioglitazone hcl) ..... By mouth once daily  Her updated medication list for this problem includes:    Benicar 20 Mg Tabs (Olmesartan medoxomil) ..... One by mouth qd  Labs Reviewed: HgBA1c: 6.5 (04/10/2007)   Creat: 0.8 (04/10/2007)   Microalbumin: 1.2 (04/10/2007)   Problem # 4:  DYSLIPIDEMIA (ICD-272.4) Pt tolerating Crestor.  Monitor FLP, LFT's.   Her updated medication list for this problem includes:    Crestor 20 Mg Tabs (Rosuvastatin calcium) ..... One by mouth once daily  Labs Reviewed: Chol: 265 (04/10/2007)   HDL: 47.5 (04/10/2007)   LDL: DEL (04/10/2007)   TG: 65 (04/10/2007) SGOT: 17 (04/10/2007)   SGPT: 16 (04/10/2007)   Complete Medication List: 1)  Crestor 20 Mg Tabs (Rosuvastatin calcium) .... One by mouth once daily 2)  Amlodipine Besylate 10 Mg Tabs (Amlodipine besylate) .... One by mouth once daily 3)  Benicar 20 Mg Tabs (Olmesartan medoxomil) .... One by mouth qd 4)  Hyoscyamine Sulfate Cr 0.375 Mg Xr12h-tab (Hyoscyamine sulfate) .Marland Kitchen.. 1 tab bid   Patient Instructions: 1)  Please schedule a follow-up appointment in 2 months. 2)  BMP prior to visit, ICD-9:  401.9 3)  CBC w/ Diff prior to visit, ICD-9:  285.9 4)  HbgA1C prior to visit, ICD-9:  250.00 5)  Urine Microalbumin prior to visit, ICD-9:  250.00 6)  FLP, AST, ALT:  272.4 7)  Please return for lab work one (1) week before your next appointment.    Prescriptions: CRESTOR 20 MG  TABS (ROSUVASTATIN CALCIUM) one by mouth once daily  #30 x 5   Entered and  Authorized by:   D. Thomos Lemons DO   Signed by:   D. Thomos Lemons DO on 01/20/2008   Method used:   Electronically sent to [id number]       [address]       [place], [place]  [address]       Ph: [phone] or [phone]       Fax: [phone]   RxID:   [id number]  ] Current Allergies (reviewed today): ! ACE INHIBITORS

## 2010-07-27 NOTE — Progress Notes (Signed)
Summary: Medication Refill  Phone Note Refill Request Message from:  Fax from Pharmacy on July 01, 2009 4:56 PM  Refills Requested: Medication #1:  HYDROCHLOROTHIAZIDE 25 MG TABS Take 1 tablet by mouth once a day   Dosage confirmed as above?Dosage Confirmed   Brand Name Necessary? No   Supply Requested: 3 months  Medication #2:  LISINOPRIL 20 MG TABS Take 1 tablet by mouth once a day   Dosage confirmed as above?Dosage Confirmed   Brand Name Necessary? No   Supply Requested: 3 months  Method Requested: Electronic Next Appointment Scheduled: 07/20/09@ 8:15am Initial call taken by: Glendell Docker CMA,  July 01, 2009 4:56 PM  Follow-up for Phone Call        ok for 3 month supply with 1 refill Follow-up by: D. Thomos Lemons DO,  July 04, 2009 1:26 PM  Additional Follow-up for Phone Call Additional follow up Details #1::        Rx  sent electronically to pharmacy Additional Follow-up by: Glendell Docker CMA,  July 04, 2009 3:54 PM    Prescriptions: LISINOPRIL 20 MG TABS (LISINOPRIL) Take 1 tablet by mouth once a day  #90 x 1   Entered by:   Glendell Docker CMA   Authorized by:   D. Thomos Lemons DO   Signed by:   Glendell Docker CMA on 07/04/2009   Method used:   Electronically to        [facility] (retail)       [address]       [place], [place]  [address]       Ph: [phone]       Fax: [phone]   RxID:   [id number] HYDROCHLOROTHIAZIDE 25 MG TABS (HYDROCHLOROTHIAZIDE) Take 1 tablet by mouth once a day  #90 x 1   Entered by:   Glendell Docker CMA   Authorized by:   D. Thomos Lemons DO   Signed by:   Glendell Docker CMA on 07/04/2009   Method used:   Electronically to        [facility] (retail)       [address]       [place], [place]  [address]       Ph: [phone]       Fax: [phone]   RxID:   [id number]

## 2010-07-27 NOTE — Letter (Signed)
Summary: [facility] Request for Medical Consultation Form  [facility] Request for Medical Consultation Form   Imported By: Esmeralda Links D'jimraou 05/09/2007 12:17:43  _____________________________________________________________________  External Attachment:    Type:   Image     Comment:   External Document

## 2010-08-02 ENCOUNTER — Telehealth: Payer: Self-pay | Admitting: Internal Medicine

## 2010-08-02 NOTE — Assessment & Plan Note (Signed)
Summary: MIGRAINE HEADACHE/DK   Vital Signs:  Patient profile:   50 year old female O2 Sat:      99 % on 0.25 L/min Temp:     98.7 degrees F oral Pulse rate:   80 / minute Resp:     20 per minute BP sitting:   130 / 80  (right arm) Cuff size:   large  Vitals Entered By: Glendell Docker CMA (July 11, 2010 2:57 PM)  O2 Flow:  0.25 L/min CC: Migraine Headache Is Patient Diabetic? No Pain Assessment Patient in pain? no      Comments c/o migraine headache for the past 5 days with nausea & vomiting   Primary Care Provider:  Dondra Spry DO  CC:  Migraine Headache.  History of Present Illness: 50 y/o AA female c/o severe  Saturday - pt started getting exacerbation of migraine some nausea.  she was passenger with daughter on her way to [facility] tried OTC excedrin migraine but no improvement occasional hot flashes  last month - had two  upper teeth removed.  treated with abx.  denies dental pain  pt also seen by ENT had her ears cleaned and ear drops   Preventive Screening-Counseling & Management  Alcohol-Tobacco     Smoking Status: quit  Allergies: 1)  Ace Inhibitors  Past History:  Past Medical History: CHEST PAIN, ATYPICAL (ICD-786.59) HYPERLIPIDEMIA (ICD-272.2)  HYPERTENSION (ICD-401.9)   GERD (ICD-530.81)  DIABETES MELLITUS, TYPE II BORDERLINE (ICD-790.29)    CONSTIPATION (ICD-564.00) GASTROPARESIS (ICD-536.3)   ANEMIA-IRON DEFICIENCY (ICD-280.9)    ESOPHAGEAL STRICTURE (ICD-530.3)  ALLERGIC RHINITIS (ICD-477.9)   MIGRAINE HEADACHE (ICD-346.90) ENDOMETRIOSIS (ICD-617.9) OVARIAN CYST (ICD-620.2) PEPTIC ULCER DISEASE (ICD-533.90)    Non compliance  Social History: Smoking Status:  quit  Physical Exam  General:  alert, well-developed, and well-nourished.   Ears:  R ear normal and L ear normal.   Mouth:  pharynx pink and moist.  no sign of dental abscess Lungs:  normal respiratory effort, normal breath sounds, no crackles, and no  wheezes.   Heart:  normal rate, regular rhythm, and no gallop.   Extremities:  No lower extremity edema Neurologic:  cranial nerves II-XII intact and gait normal.     Impression & Recommendations:  Problem # 1:  MIGRAINE HEADACHE (ICD-346.90) Assessment Deteriorated pt having exacerbation of migraine headaches tx with phenergen IM  solumedrol IM pt has seen Dr. Vela Prose in the past (apparently she has tried multiple meds - topamax, gabapentin, triptans,  muscle relaxers) Orders: Promethazine up to 50mg  (J2550) Solumedrol up to 125mg  (Z6109) Admin of Therapeutic Inj  intramuscular or subcutaneous (60454) Headache Clinic Referral (Headache) Assessment: Deteriorated  Complete Medication List: 1)  Diovan Hct 160-12.5 Mg Tabs (Valsartan-hydrochlorothiazide) .... Take 1 tablet by mouth once a day 2)  Ondansetron Hcl 4 Mg Tabs (Ondansetron hcl) .... One by mouth two times a day as needed for nausea 3)  Klor-con M20 20 Meq Cr-tabs (Potassium chloride crys cr) .... One by mouth once daily 4)  Nexium 40 Mg Cpdr (Esomeprazole magnesium) .Marland Kitchen.. 1 capsule each day 30 minutes before meal   Medication Administration  Injection # 1:    Medication: Promethazine up to 50mg     Diagnosis: MIGRAINE HEADACHE (ICD-346.90)    Route: IM    Site: R deltoid    Exp Date: 06/25/2011    Lot #: [id number]    Mfr: [email or web address]    Comments: promethazine 25 mg given    Patient tolerated injection without complications  Given by: Glendell Docker CMA (July 11, 2010 3:24 PM)  Injection # 2:    Medication: Solumedrol up to 125mg     Diagnosis: MIGRAINE HEADACHE (ICD-346.90)    Route: IM    Site: L deltoid    Exp Date: 12/22/2012    Lot #: [id number]    Mfr: [email or web address]    Comments: solumedrol 125 mg given    Patient tolerated injection without complications    Given by: Glendell Docker CMA (July 11, 2010 3:26 PM)  Orders Added: 1)  Promethazine up to 50mg  [J2550] 2)  Solumedrol up to 125mg  [J2930] 3)  Admin of  Therapeutic Inj  intramuscular or subcutaneous [96372] 4)  Headache Clinic Referral [Headache] 5)  Est. Patient Level III [16109]    Current Allergies (reviewed today): ACE INHIBITORS   Medication Administration  Injection # 1:    Medication: Promethazine up to 50mg     Diagnosis: MIGRAINE HEADACHE (ICD-346.90)    Route: IM    Site: R deltoid    Exp Date: 06/25/2011    Lot #: [id number]    Mfr: NOVAPLUS    Comments: promethazine 25 mg given    Patient tolerated injection without complications    Given by: Glendell Docker CMA (July 11, 2010 3:24 PM)  Injection # 2:    Medication: Solumedrol up to 125mg     Diagnosis: MIGRAINE HEADACHE (ICD-346.90)    Route: IM    Site: L deltoid    Exp Date: 12/22/2012    Lot #: [id number]    Mfr: PFIZER    Comments: solumedrol 125 mg given    Patient tolerated injection without complications    Given by: Glendell Docker CMA (July 11, 2010 3:26 PM)  Orders Added: 1)  Promethazine up to 50mg  [J2550] 2)  Solumedrol up to 125mg  [J2930] 3)  Admin of Therapeutic Inj  intramuscular or subcutaneous [96372] 4)  Headache Clinic Referral [Headache] 5)  Est. Patient Level III [98119]

## 2010-08-03 ENCOUNTER — Encounter: Payer: Self-pay | Admitting: Internal Medicine

## 2010-08-03 ENCOUNTER — Ambulatory Visit (INDEPENDENT_AMBULATORY_CARE_PROVIDER_SITE_OTHER): Payer: BC Managed Care – PPO | Admitting: Internal Medicine

## 2010-08-03 DIAGNOSIS — L02229 Furuncle of trunk, unspecified: Secondary | ICD-10-CM

## 2010-08-03 DIAGNOSIS — L0292 Furuncle, unspecified: Secondary | ICD-10-CM | POA: Insufficient documentation

## 2010-08-03 DIAGNOSIS — L0293 Carbuncle, unspecified: Secondary | ICD-10-CM | POA: Insufficient documentation

## 2010-08-03 DIAGNOSIS — L02239 Carbuncle of trunk, unspecified: Secondary | ICD-10-CM

## 2010-08-10 NOTE — Letter (Signed)
Summary: [facility]  [facility]   Imported By: Maryln Gottron 08/03/2010 10:15:11  _____________________________________________________________________  External Attachment:    Type:   Image     Comment:   External Document

## 2010-08-10 NOTE — Progress Notes (Signed)
Summary: Breast Pain  Phone Note Call from Patient Call back at [phone]   Caller: Patient Call For: D. Thomos Lemons DO Summary of Call: patien called and left voice message stating she had her mammogram done, and spoke with the physician about the  breast pain and drainage that she is having. Her messsage states that they informed her that her mammogram was normal, and she should follow up with her pcp.  She states she continues to have  breast pain, inflammation ,cyst with red drainage. She would like to know what Dr Artist Pais advises. Initial call taken by: Glendell Docker CMA,  August 02, 2010 11:50 AM  Follow-up for Phone Call        OV needed Follow-up by: D. Thomos Lemons DO,  August 02, 2010 12:11 PM  Additional Follow-up for Phone Call Additional follow up Details #1::        call placed to patient  at [phone], she has been advised per Dr Artist Pais instructions. Appointment scheduled for Thursday 08/03/2010 @ 9:15 am with Dr Artist Pais Additional Follow-up by: Glendell Docker CMA,  August 02, 2010 2:35 PM

## 2010-08-22 NOTE — Assessment & Plan Note (Signed)
Summary: Breast Pain   Vital Signs:  Patient profile:   50 year old female Menstrual status:  hysterectomy Height:      64 inches Weight:      226 pounds BMI:     38.93 O2 Sat:      100 % on Room air Temp:     98.5 degrees F oral Pulse rate:   80 / minute Resp:     18 per minute BP sitting:   116 / 80  (right arm) Cuff size:   large  Vitals Entered By: Glendell Docker CMA (August 03, 2010 9:17 AM)  O2 Flow:  Room air CC: Breast Pain Is Patient Diabetic? No Pain Assessment Patient in pain? no      Comments c/o left breast pain, inflammation ,cyst with red drainage,   Primary Care Provider:  Dondra Spry DO  CC:  Breast Pain.  History of Present Illness: 50 y/o female co small boil on left breast mild drainage no improvement with self care  Preventive Screening-Counseling & Management  Alcohol-Tobacco     Smoking Status: quit  Allergies: 1)  Ace Inhibitors  Past History:  Past Medical History: CHEST PAIN, ATYPICAL (ICD-786.59) HYPERLIPIDEMIA (ICD-272.2)  HYPERTENSION (ICD-401.9)   GERD (ICD-530.81)   DIABETES MELLITUS, TYPE II BORDERLINE (ICD-790.29)    CONSTIPATION (ICD-564.00) GASTROPARESIS (ICD-536.3)   ANEMIA-IRON DEFICIENCY (ICD-280.9)    ESOPHAGEAL STRICTURE (ICD-530.3)  ALLERGIC RHINITIS (ICD-477.9)   MIGRAINE HEADACHE (ICD-346.90) ENDOMETRIOSIS (ICD-617.9) OVARIAN CYST (ICD-620.2) PEPTIC ULCER DISEASE (ICD-533.90)    Non compliance  Past Surgical History: Exploratory Laparotomy w/ BSO (2003)  Lt. Knee (2005)      Hysterectomy    Cholecystectomy  Cardiac cath 2009 - mild non obstructive CAD   Physical Exam  General:  alert and overweight-appearing.   Breasts:  1 cm cyst medial left upper breast, no fluctuance,  mild tenderness,  no drainage Lungs:  normal respiratory effort, normal breath sounds, no crackles, and no wheezes.   Heart:  normal rate, regular rhythm, and no gallop.     Impression &  Recommendations:  Problem # 1:  CARBUNCLE AND FURUNCLE OF UNSPECIFIED SITE (ICD-680.9) small abscess on left breast use warm compress take abx as directed Patient advised to call office if symptoms persist or worsen.  Complete Medication List: 1)  Diovan Hct 160-12.5 Mg Tabs (Valsartan-hydrochlorothiazide) .... Take 1 tablet by mouth once a day 2)  Ondansetron Hcl 4 Mg Tabs (Ondansetron hcl) .... One by mouth two times a day as needed for nausea 3)  Klor-con M20 20 Meq Cr-tabs (Potassium chloride crys cr) .... One by mouth once daily 4)  Nexium 40 Mg Cpdr (Esomeprazole magnesium) .Marland Kitchen.. 1 capsule each day 30 minutes before meal 5)  Clindamycin Hcl 300 Mg Caps (Clindamycin hcl) .... One by mouth three times a day  Patient Instructions: 1)  Apply warm compress to left breast cyst as directed 2)  Call our office if your symptoms do not  improve or gets worse. Prescriptions: CLINDAMYCIN HCL 300 MG CAPS (CLINDAMYCIN HCL) one by mouth three times a day  #21 x 0   Entered and Authorized by:   D. Thomos Lemons DO   Signed by:   D. Thomos Lemons DO on 08/03/2010   Method used:   Electronically to        [facility] (retail)       [address]. [address]       [place]  [place], [place]  [address]       Ph: [phone]       Fax: [phone]   RxID:   [id number]    Orders Added: 1)  Est. Patient Level III [40102]

## 2010-09-05 ENCOUNTER — Ambulatory Visit: Payer: Self-pay | Admitting: Internal Medicine

## 2010-09-05 DIAGNOSIS — Z0289 Encounter for other administrative examinations: Secondary | ICD-10-CM

## 2010-09-13 ENCOUNTER — Encounter (HOSPITAL_BASED_OUTPATIENT_CLINIC_OR_DEPARTMENT_OTHER): Payer: BC Managed Care – PPO | Admitting: Hematology & Oncology

## 2010-09-13 DIAGNOSIS — D560 Alpha thalassemia: Secondary | ICD-10-CM

## 2010-09-13 DIAGNOSIS — D649 Anemia, unspecified: Secondary | ICD-10-CM

## 2010-09-13 DIAGNOSIS — D509 Iron deficiency anemia, unspecified: Secondary | ICD-10-CM

## 2010-09-13 LAB — CBC WITH DIFFERENTIAL (CANCER CENTER ONLY)
BASO#: 0 10*3/uL (ref 0.0–0.2)
BASO%: 0.3 % (ref 0.0–2.0)
EOS%: 1.3 % (ref 0.0–7.0)
Eosinophils Absolute: 0.1 10*3/uL (ref 0.0–0.5)
HCT: 36.8 % (ref 34.8–46.6)
HGB: 11.9 g/dL (ref 11.6–15.9)
LYMPH#: 2 10*3/uL (ref 0.9–3.3)
LYMPH%: 27.5 % (ref 14.0–48.0)
MCH: 22.7 pg — ABNORMAL LOW (ref 26.0–34.0)
MCHC: 32.3 g/dL (ref 32.0–36.0)
MCV: 70 fL — ABNORMAL LOW (ref 81–101)
MONO#: 0.6 10*3/uL (ref 0.1–0.9)
MONO%: 8.1 % (ref 0.0–13.0)
NEUT#: 4.7 10*3/uL (ref 1.5–6.5)
NEUT%: 62.8 % (ref 39.6–80.0)
Platelets: 273 10*3/uL (ref 145–400)
RBC: 5.25 10*6/uL (ref 3.70–5.32)
RDW: 14.8 % (ref 11.1–15.7)
WBC: 7.4 10*3/uL (ref 3.9–10.0)

## 2010-09-13 LAB — RETICULOCYTES (CHCC)
ABS Retic: 61.8 10*3/uL (ref 19.0–186.0)
RBC.: 5.15 MIL/uL — ABNORMAL HIGH (ref 3.87–5.11)
Retic Ct Pct: 1.2 % (ref 0.4–3.1)

## 2010-09-13 LAB — TECHNOLOGIST REVIEW CHCC SATELLITE

## 2010-09-13 LAB — IRON AND TIBC
%SAT: 12 % — ABNORMAL LOW (ref 20–55)
Iron: 41 ug/dL — ABNORMAL LOW (ref 42–145)
TIBC: 332 ug/dL (ref 250–470)
UIBC: 291 ug/dL

## 2010-09-13 LAB — FERRITIN: Ferritin: 518 ng/mL — ABNORMAL HIGH (ref 10–291)

## 2010-09-13 LAB — CHCC SATELLITE - SMEAR

## 2010-09-15 LAB — BASIC METABOLIC PANEL
BUN: 10 mg/dL (ref 6–23)
CO2: 29 mEq/L (ref 19–32)
Calcium: 9 mg/dL (ref 8.4–10.5)
Chloride: 99 mEq/L (ref 96–112)
Creatinine, Ser: 0.95 mg/dL (ref 0.4–1.2)
GFR calc Af Amer: >60 mL/min (ref >60)
GFR calc non Af Amer: >60 mL/min (ref >60)
Glucose, Bld: 119 mg/dL — ABNORMAL HIGH (ref 70–99)
Potassium: 3.8 mEq/L (ref 3.5–5.1)
Sodium: 135 mEq/L (ref 135–145)

## 2010-09-15 LAB — DIFFERENTIAL
Basophils Absolute: 0.1 10*3/uL (ref 0.0–0.1)
Basophils Relative: 1 % (ref 0–1)
Eosinophils Absolute: 0.1 10*3/uL (ref 0.0–0.7)
Eosinophils Relative: 1 % (ref 0–5)
Lymphocytes Relative: 37 % (ref 12–46)
Lymphs Abs: 2.6 10*3/uL (ref 0.7–4.0)
Monocytes Absolute: 0.5 10*3/uL (ref 0.1–1.0)
Monocytes Relative: 7 % (ref 3–12)
Neutro Abs: 3.8 10*3/uL (ref 1.7–7.7)
Neutrophils Relative %: 54 % (ref 43–77)

## 2010-09-15 LAB — URINALYSIS, ROUTINE W REFLEX MICROSCOPIC
Bilirubin Urine: NEGATIVE
Glucose, UA: NEGATIVE mg/dL
Hgb urine dipstick: NEGATIVE
Ketones, ur: NEGATIVE mg/dL
Nitrite: NEGATIVE
Protein, ur: NEGATIVE mg/dL
Specific Gravity, Urine: 1.005 (ref 1.005–1.030)
Urobilinogen, UA: 0.2 mg/dL (ref 0.0–1.0)
pH: 7 (ref 5.0–8.0)

## 2010-09-15 LAB — CBC
HCT: 32.8 % — ABNORMAL LOW (ref 36.0–46.0)
Hemoglobin: 10.7 g/dL — ABNORMAL LOW (ref 12.0–15.0)
MCHC: 32.5 g/dL (ref 30.0–36.0)
MCV: 69.2 fL — ABNORMAL LOW (ref 78.0–100.0)
Platelets: 228 10*3/uL (ref 150–400)
RBC: 4.74 MIL/uL (ref 3.87–5.11)
RDW: 15.9 % — ABNORMAL HIGH (ref 11.5–15.5)
WBC: 7.1 10*3/uL (ref 4.0–10.5)

## 2010-09-18 LAB — CBC
HCT: 33.5 % — ABNORMAL LOW (ref 36.0–46.0)
Hemoglobin: 10.3 g/dL — ABNORMAL LOW (ref 12.0–15.0)
MCHC: 30.6 g/dL (ref 30.0–36.0)
MCV: 70.3 fL — ABNORMAL LOW (ref 78.0–100.0)
Platelets: 236 10*3/uL (ref 150–400)
RBC: 4.77 MIL/uL (ref 3.87–5.11)
RDW: 16.1 % — ABNORMAL HIGH (ref 11.5–15.5)
WBC: 6.7 10*3/uL (ref 4.0–10.5)

## 2010-09-18 LAB — COMPREHENSIVE METABOLIC PANEL
ALT: 17 U/L (ref 0–35)
AST: 21 U/L (ref 0–37)
Albumin: 3.2 g/dL — ABNORMAL LOW (ref 3.5–5.2)
Alkaline Phosphatase: 116 U/L (ref 39–117)
BUN: 9 mg/dL (ref 6–23)
CO2: 28 mEq/L (ref 19–32)
Calcium: 8.5 mg/dL (ref 8.4–10.5)
Chloride: 106 mEq/L (ref 96–112)
Creatinine, Ser: 0.89 mg/dL (ref 0.4–1.2)
GFR calc Af Amer: >60 mL/min (ref >60)
GFR calc non Af Amer: >60 mL/min (ref >60)
Glucose, Bld: 103 mg/dL — ABNORMAL HIGH (ref 70–99)
Potassium: 3 mEq/L — ABNORMAL LOW (ref 3.5–5.1)
Sodium: 142 mEq/L (ref 135–145)
Total Bilirubin: 0.2 mg/dL — ABNORMAL LOW (ref 0.3–1.2)
Total Protein: 7.1 g/dL (ref 6.0–8.3)

## 2010-09-18 LAB — DIFFERENTIAL
Basophils Absolute: 0 10*3/uL (ref 0.0–0.1)
Basophils Relative: 0 % (ref 0–1)
Eosinophils Absolute: 0.2 10*3/uL (ref 0.0–0.7)
Eosinophils Relative: 4 % (ref 0–5)
Lymphocytes Relative: 38 % (ref 12–46)
Lymphs Abs: 2.5 10*3/uL (ref 0.7–4.0)
Monocytes Absolute: 0.5 10*3/uL (ref 0.1–1.0)
Monocytes Relative: 8 % (ref 3–12)
Neutro Abs: 3.3 10*3/uL (ref 1.7–7.7)
Neutrophils Relative %: 50 % (ref 43–77)

## 2010-09-18 LAB — URINALYSIS, ROUTINE W REFLEX MICROSCOPIC
Bilirubin Urine: NEGATIVE
Glucose, UA: NEGATIVE mg/dL
Hgb urine dipstick: NEGATIVE
Ketones, ur: NEGATIVE mg/dL
Nitrite: NEGATIVE
Protein, ur: NEGATIVE mg/dL
Specific Gravity, Urine: 1.02 (ref 1.005–1.030)
Urobilinogen, UA: 0.2 mg/dL (ref 0.0–1.0)
pH: 7.5 (ref 5.0–8.0)

## 2010-09-18 LAB — POCT CARDIAC MARKERS
CKMB, poc: <1 ng/mL — ABNORMAL LOW (ref 1.0–8.0)
Myoglobin, poc: 113 ng/mL (ref 12–200)
Troponin i, poc: <0.05 ng/mL (ref 0.00–0.09)

## 2010-09-18 LAB — LIPASE, BLOOD: Lipase: 28 U/L (ref 11–59)

## 2010-09-26 LAB — BUN: BUN: 14 mg/dL (ref 6–23)

## 2010-09-26 LAB — CREATININE, SERUM
Creatinine, Ser: 0.9 mg/dL (ref 0.4–1.2)
GFR calc Af Amer: >60 mL/min (ref >60)
GFR calc non Af Amer: >60 mL/min (ref >60)

## 2010-09-29 LAB — CARDIAC PANEL(CRET KIN+CKTOT+MB+TROPI)
CK, MB: 0.8 ng/mL (ref 0.3–4.0)
CK, MB: 1.1 ng/mL (ref 0.3–4.0)
CK, MB: 1.1 ng/mL (ref 0.3–4.0)
Relative Index: 0.7 (ref 0.0–2.5)
Relative Index: 0.8 (ref 0.0–2.5)
Relative Index: 1 (ref 0.0–2.5)
Total CK: 106 U/L (ref 7–177)
Total CK: 108 U/L (ref 7–177)
Total CK: 130 U/L (ref 7–177)
Troponin I: 0.01 ng/mL (ref 0.00–0.06)
Troponin I: 0.01 ng/mL (ref 0.00–0.06)
Troponin I: 0.01 ng/mL (ref 0.00–0.06)

## 2010-09-29 LAB — GLUCOSE, CAPILLARY
Glucose-Capillary: 101 mg/dL — ABNORMAL HIGH (ref 70–99)
Glucose-Capillary: 102 mg/dL — ABNORMAL HIGH (ref 70–99)
Glucose-Capillary: 102 mg/dL — ABNORMAL HIGH (ref 70–99)
Glucose-Capillary: 107 mg/dL — ABNORMAL HIGH (ref 70–99)
Glucose-Capillary: 110 mg/dL — ABNORMAL HIGH (ref 70–99)
Glucose-Capillary: 112 mg/dL — ABNORMAL HIGH (ref 70–99)
Glucose-Capillary: 115 mg/dL — ABNORMAL HIGH (ref 70–99)
Glucose-Capillary: 120 mg/dL — ABNORMAL HIGH (ref 70–99)
Glucose-Capillary: 93 mg/dL (ref 70–99)
Glucose-Capillary: 96 mg/dL (ref 70–99)
Glucose-Capillary: 100 mg/dL — ABNORMAL HIGH (ref 70–99)
Glucose-Capillary: 102 mg/dL — ABNORMAL HIGH (ref 70–99)
Glucose-Capillary: 103 mg/dL — ABNORMAL HIGH (ref 70–99)
Glucose-Capillary: 105 mg/dL — ABNORMAL HIGH (ref 70–99)
Glucose-Capillary: 112 mg/dL — ABNORMAL HIGH (ref 70–99)
Glucose-Capillary: 117 mg/dL — ABNORMAL HIGH (ref 70–99)
Glucose-Capillary: 118 mg/dL — ABNORMAL HIGH (ref 70–99)
Glucose-Capillary: 121 mg/dL — ABNORMAL HIGH (ref 70–99)
Glucose-Capillary: 85 mg/dL (ref 70–99)
Glucose-Capillary: 86 mg/dL (ref 70–99)
Glucose-Capillary: 90 mg/dL (ref 70–99)
Glucose-Capillary: 92 mg/dL (ref 70–99)
Glucose-Capillary: 93 mg/dL (ref 70–99)
Glucose-Capillary: 95 mg/dL (ref 70–99)

## 2010-09-29 LAB — CBC
HCT: 34.3 % — ABNORMAL LOW (ref 36.0–46.0)
HCT: 32.1 % — ABNORMAL LOW (ref 36.0–46.0)
HCT: 34.7 % — ABNORMAL LOW (ref 36.0–46.0)
HCT: 36.7 % (ref 36.0–46.0)
Hemoglobin: 10.9 g/dL — ABNORMAL LOW (ref 12.0–15.0)
Hemoglobin: 10.4 g/dL — ABNORMAL LOW (ref 12.0–15.0)
Hemoglobin: 11.1 g/dL — ABNORMAL LOW (ref 12.0–15.0)
Hemoglobin: 11.6 g/dL — ABNORMAL LOW (ref 12.0–15.0)
MCHC: 32 g/dL (ref 30.0–36.0)
MCHC: 31.6 g/dL (ref 30.0–36.0)
MCHC: 31.9 g/dL (ref 30.0–36.0)
MCHC: 32.5 g/dL (ref 30.0–36.0)
MCV: 69.3 fL — ABNORMAL LOW (ref 78.0–100.0)
MCV: 69.5 fL — ABNORMAL LOW (ref 78.0–100.0)
MCV: 69.8 fL — ABNORMAL LOW (ref 78.0–100.0)
MCV: 70.5 fL — ABNORMAL LOW (ref 78.0–100.0)
Platelets: 222 10*3/uL (ref 150–400)
Platelets: 201 10*3/uL (ref 150–400)
Platelets: 203 10*3/uL (ref 150–400)
Platelets: 242 10*3/uL (ref 150–400)
RBC: 4.95 MIL/uL (ref 3.87–5.11)
RBC: 4.6 MIL/uL (ref 3.87–5.11)
RBC: 4.92 MIL/uL (ref 3.87–5.11)
RBC: 5.28 MIL/uL — ABNORMAL HIGH (ref 3.87–5.11)
RDW: 15.7 % — ABNORMAL HIGH (ref 11.5–15.5)
RDW: 15.2 % (ref 11.5–15.5)
RDW: 15.5 % (ref 11.5–15.5)
RDW: 15.9 % — ABNORMAL HIGH (ref 11.5–15.5)
WBC: 6.7 10*3/uL (ref 4.0–10.5)
WBC: 5.9 10*3/uL (ref 4.0–10.5)
WBC: 6.5 10*3/uL (ref 4.0–10.5)
WBC: 7.5 10*3/uL (ref 4.0–10.5)

## 2010-09-29 LAB — HEPATIC FUNCTION PANEL
ALT: 19 U/L (ref 0–35)
ALT: 36 U/L — ABNORMAL HIGH (ref 0–35)
ALT: 57 U/L — ABNORMAL HIGH (ref 0–35)
AST: 18 U/L (ref 0–37)
AST: 25 U/L (ref 0–37)
AST: 43 U/L — ABNORMAL HIGH (ref 0–37)
Albumin: 2.9 g/dL — ABNORMAL LOW (ref 3.5–5.2)
Albumin: 3.3 g/dL — ABNORMAL LOW (ref 3.5–5.2)
Albumin: 3.7 g/dL (ref 3.5–5.2)
Alkaline Phosphatase: 109 U/L (ref 39–117)
Alkaline Phosphatase: 161 U/L — ABNORMAL HIGH (ref 39–117)
Alkaline Phosphatase: 163 U/L — ABNORMAL HIGH (ref 39–117)
Bilirubin, Direct: 0.1 mg/dL (ref 0.0–0.3)
Bilirubin, Direct: 0.1 mg/dL (ref 0.0–0.3)
Bilirubin, Direct: 0.1 mg/dL (ref 0.0–0.3)
Indirect Bilirubin: 0.1 mg/dL — ABNORMAL LOW (ref 0.3–0.9)
Indirect Bilirubin: 0.3 mg/dL (ref 0.3–0.9)
Indirect Bilirubin: 0.7 mg/dL (ref 0.3–0.9)
Total Bilirubin: 0.2 mg/dL — ABNORMAL LOW (ref 0.3–1.2)
Total Bilirubin: 0.4 mg/dL (ref 0.3–1.2)
Total Bilirubin: 0.8 mg/dL (ref 0.3–1.2)
Total Protein: 6.6 g/dL (ref 6.0–8.3)
Total Protein: 6.7 g/dL (ref 6.0–8.3)
Total Protein: 7.7 g/dL (ref 6.0–8.3)

## 2010-09-29 LAB — BASIC METABOLIC PANEL
BUN: 6 mg/dL (ref 6–23)
BUN: 16 mg/dL (ref 6–23)
BUN: 5 mg/dL — ABNORMAL LOW (ref 6–23)
CO2: 27 mEq/L (ref 19–32)
CO2: 23 mEq/L (ref 19–32)
CO2: 29 mEq/L (ref 19–32)
Calcium: 8.9 mg/dL (ref 8.4–10.5)
Calcium: 8.9 mg/dL (ref 8.4–10.5)
Calcium: 9 mg/dL (ref 8.4–10.5)
Chloride: 103 mEq/L (ref 96–112)
Chloride: 106 mEq/L (ref 96–112)
Chloride: 106 mEq/L (ref 96–112)
Creatinine, Ser: 0.8 mg/dL (ref 0.4–1.2)
Creatinine, Ser: 0.86 mg/dL (ref 0.4–1.2)
Creatinine, Ser: 0.93 mg/dL (ref 0.4–1.2)
GFR calc Af Amer: >60 mL/min (ref >60)
GFR calc Af Amer: >60 mL/min (ref >60)
GFR calc Af Amer: >60 mL/min (ref >60)
GFR calc non Af Amer: >60 mL/min (ref >60)
GFR calc non Af Amer: >60 mL/min (ref >60)
GFR calc non Af Amer: >60 mL/min (ref >60)
Glucose, Bld: 112 mg/dL — ABNORMAL HIGH (ref 70–99)
Glucose, Bld: 108 mg/dL — ABNORMAL HIGH (ref 70–99)
Glucose, Bld: 99 mg/dL (ref 70–99)
Potassium: 4 mEq/L (ref 3.5–5.1)
Potassium: 3.4 mEq/L — ABNORMAL LOW (ref 3.5–5.1)
Potassium: 4.3 mEq/L (ref 3.5–5.1)
Sodium: 137 mEq/L (ref 135–145)
Sodium: 139 mEq/L (ref 135–145)
Sodium: 140 mEq/L (ref 135–145)

## 2010-09-29 LAB — URINE CULTURE: Special Requests: NEGATIVE

## 2010-09-29 LAB — COMPREHENSIVE METABOLIC PANEL
ALT: 47 U/L — ABNORMAL HIGH (ref 0–35)
ALT: 61 U/L — ABNORMAL HIGH (ref 0–35)
AST: 46 U/L — ABNORMAL HIGH (ref 0–37)
AST: 59 U/L — ABNORMAL HIGH (ref 0–37)
Albumin: 3.2 g/dL — ABNORMAL LOW (ref 3.5–5.2)
Albumin: 3.4 g/dL — ABNORMAL LOW (ref 3.5–5.2)
Alkaline Phosphatase: 159 U/L — ABNORMAL HIGH (ref 39–117)
Alkaline Phosphatase: 167 U/L — ABNORMAL HIGH (ref 39–117)
BUN: 5 mg/dL — ABNORMAL LOW (ref 6–23)
BUN: 8 mg/dL (ref 6–23)
CO2: 26 mEq/L (ref 19–32)
CO2: 29 mEq/L (ref 19–32)
Calcium: 8.5 mg/dL (ref 8.4–10.5)
Calcium: 8.6 mg/dL (ref 8.4–10.5)
Chloride: 103 mEq/L (ref 96–112)
Chloride: 104 mEq/L (ref 96–112)
Creatinine, Ser: 0.84 mg/dL (ref 0.4–1.2)
Creatinine, Ser: 0.85 mg/dL (ref 0.4–1.2)
GFR calc Af Amer: >60 mL/min (ref >60)
GFR calc Af Amer: >60 mL/min (ref >60)
GFR calc non Af Amer: >60 mL/min (ref >60)
GFR calc non Af Amer: >60 mL/min (ref >60)
Glucose, Bld: 104 mg/dL — ABNORMAL HIGH (ref 70–99)
Glucose, Bld: 98 mg/dL (ref 70–99)
Potassium: 3.7 mEq/L (ref 3.5–5.1)
Potassium: 3.7 mEq/L (ref 3.5–5.1)
Sodium: 136 mEq/L (ref 135–145)
Sodium: 137 mEq/L (ref 135–145)
Total Bilirubin: 0.6 mg/dL (ref 0.3–1.2)
Total Bilirubin: 0.7 mg/dL (ref 0.3–1.2)
Total Protein: 6.6 g/dL (ref 6.0–8.3)
Total Protein: 7.1 g/dL (ref 6.0–8.3)

## 2010-09-29 LAB — IRON AND TIBC
Iron: 92 ug/dL (ref 42–135)
Saturation Ratios: 30 % (ref 20–55)
TIBC: 304 ug/dL (ref 250–470)
UIBC: 212 ug/dL

## 2010-09-29 LAB — URINALYSIS, ROUTINE W REFLEX MICROSCOPIC
Bilirubin Urine: NEGATIVE
Glucose, UA: NEGATIVE mg/dL
Hgb urine dipstick: NEGATIVE
Ketones, ur: NEGATIVE mg/dL
Nitrite: NEGATIVE
Protein, ur: NEGATIVE mg/dL
Specific Gravity, Urine: 1.011 (ref 1.005–1.030)
Urobilinogen, UA: 1 mg/dL (ref 0.0–1.0)
pH: 7.5 (ref 5.0–8.0)

## 2010-09-29 LAB — DIFFERENTIAL
Basophils Absolute: 0 10*3/uL (ref 0.0–0.1)
Basophils Relative: 0 % (ref 0–1)
Eosinophils Absolute: 0.1 10*3/uL (ref 0.0–0.7)
Eosinophils Relative: 1 % (ref 0–5)
Lymphocytes Relative: 41 % (ref 12–46)
Lymphs Abs: 3.1 10*3/uL (ref 0.7–4.0)
Monocytes Absolute: 0.4 10*3/uL (ref 0.1–1.0)
Monocytes Relative: 5 % (ref 3–12)
Neutro Abs: 3.9 10*3/uL (ref 1.7–7.7)
Neutrophils Relative %: 53 % (ref 43–77)

## 2010-09-29 LAB — LIPID PANEL
Cholesterol: 248 mg/dL — ABNORMAL HIGH (ref 0–200)
HDL: 41 mg/dL (ref >39)
LDL Cholesterol: 183 mg/dL — ABNORMAL HIGH (ref 0–99)
Total CHOL/HDL Ratio: 6 RATIO
Triglycerides: 120 mg/dL (ref <150)
VLDL: 24 mg/dL (ref 0–40)

## 2010-09-29 LAB — CK TOTAL AND CKMB (NOT AT ARMC)
CK, MB: 0.8 ng/mL (ref 0.3–4.0)
Relative Index: 0.7 (ref 0.0–2.5)
Total CK: 114 U/L (ref 7–177)

## 2010-09-29 LAB — LIPASE, BLOOD: Lipase: 22 U/L (ref 11–59)

## 2010-09-29 LAB — POCT CARDIAC MARKERS
CKMB, poc: <1 ng/mL — ABNORMAL LOW (ref 1.0–8.0)
Myoglobin, poc: 55.3 ng/mL (ref 12–200)
Troponin i, poc: <0.05 ng/mL (ref 0.00–0.09)

## 2010-09-29 LAB — FOLATE: Folate: 12 ng/mL

## 2010-09-29 LAB — RETICULOCYTES
RBC.: 4.83 MIL/uL (ref 3.87–5.11)
Retic Count, Absolute: 29 10*3/uL (ref 19.0–186.0)
Retic Ct Pct: 0.6 % (ref 0.4–3.1)

## 2010-09-29 LAB — TROPONIN I: Troponin I: 0.01 ng/mL (ref 0.00–0.06)

## 2010-09-29 LAB — FERRITIN: Ferritin: 152 ng/mL (ref 10–291)

## 2010-09-29 LAB — VITAMIN B12: Vitamin B-12: 329 pg/mL (ref 211–911)

## 2010-09-29 LAB — MAGNESIUM: Magnesium: 1.9 mg/dL (ref 1.5–2.5)

## 2010-09-30 LAB — BASIC METABOLIC PANEL
BUN: 10 mg/dL (ref 6–23)
CO2: 25 mEq/L (ref 19–32)
Calcium: 8.6 mg/dL (ref 8.4–10.5)
Chloride: 106 mEq/L (ref 96–112)
Creatinine, Ser: 0.83 mg/dL (ref 0.4–1.2)
GFR calc Af Amer: >60 mL/min (ref >60)
GFR calc non Af Amer: >60 mL/min (ref >60)
Glucose, Bld: 97 mg/dL (ref 70–99)
Potassium: 3.2 mEq/L — ABNORMAL LOW (ref 3.5–5.1)
Sodium: 138 mEq/L (ref 135–145)

## 2010-09-30 LAB — CBC
HCT: 30.8 % — ABNORMAL LOW (ref 36.0–46.0)
HCT: 33.1 % — ABNORMAL LOW (ref 36.0–46.0)
Hemoglobin: 10.6 g/dL — ABNORMAL LOW (ref 12.0–15.0)
Hemoglobin: 9.7 g/dL — ABNORMAL LOW (ref 12.0–15.0)
MCHC: 31.5 g/dL (ref 30.0–36.0)
MCHC: 31.8 g/dL (ref 30.0–36.0)
MCV: 69.6 fL — ABNORMAL LOW (ref 78.0–100.0)
MCV: 69.9 fL — ABNORMAL LOW (ref 78.0–100.0)
Platelets: 165 10*3/uL (ref 150–400)
Platelets: 190 10*3/uL (ref 150–400)
RBC: 4.4 MIL/uL (ref 3.87–5.11)
RBC: 4.76 MIL/uL (ref 3.87–5.11)
RDW: 16.1 % — ABNORMAL HIGH (ref 11.5–15.5)
RDW: 16.1 % — ABNORMAL HIGH (ref 11.5–15.5)
WBC: 7.6 10*3/uL (ref 4.0–10.5)
WBC: 9.4 10*3/uL (ref 4.0–10.5)

## 2010-09-30 LAB — DIFFERENTIAL
Basophils Absolute: 0 10*3/uL (ref 0.0–0.1)
Basophils Relative: 0 % (ref 0–1)
Eosinophils Absolute: 0.1 10*3/uL (ref 0.0–0.7)
Eosinophils Relative: 1 % (ref 0–5)
Lymphocytes Relative: 36 % (ref 12–46)
Lymphs Abs: 3.4 10*3/uL (ref 0.7–4.0)
Monocytes Absolute: 0.6 10*3/uL (ref 0.1–1.0)
Monocytes Relative: 7 % (ref 3–12)
Neutro Abs: 5.3 10*3/uL (ref 1.7–7.7)
Neutrophils Relative %: 56 % (ref 43–77)

## 2010-09-30 LAB — CK TOTAL AND CKMB (NOT AT ARMC)
CK, MB: 1.2 ng/mL (ref 0.3–4.0)
CK, MB: 1.3 ng/mL (ref 0.3–4.0)
CK, MB: 1.4 ng/mL (ref 0.3–4.0)
Relative Index: 0.9 (ref 0.0–2.5)
Relative Index: 0.9 (ref 0.0–2.5)
Relative Index: 1 (ref 0.0–2.5)
Total CK: 131 U/L (ref 7–177)
Total CK: 137 U/L (ref 7–177)
Total CK: 159 U/L (ref 7–177)

## 2010-09-30 LAB — COMPREHENSIVE METABOLIC PANEL
ALT: 19 U/L (ref 0–35)
AST: 22 U/L (ref 0–37)
Albumin: 2.9 g/dL — ABNORMAL LOW (ref 3.5–5.2)
Alkaline Phosphatase: 100 U/L (ref 39–117)
BUN: 9 mg/dL (ref 6–23)
CO2: 26 mEq/L (ref 19–32)
Calcium: 7.7 mg/dL — ABNORMAL LOW (ref 8.4–10.5)
Chloride: 106 mEq/L (ref 96–112)
Creatinine, Ser: 0.88 mg/dL (ref 0.4–1.2)
GFR calc Af Amer: >60 mL/min (ref >60)
GFR calc non Af Amer: >60 mL/min (ref >60)
Glucose, Bld: 117 mg/dL — ABNORMAL HIGH (ref 70–99)
Potassium: 3.2 mEq/L — ABNORMAL LOW (ref 3.5–5.1)
Sodium: 138 mEq/L (ref 135–145)
Total Bilirubin: 0.4 mg/dL (ref 0.3–1.2)
Total Protein: 6.4 g/dL (ref 6.0–8.3)

## 2010-09-30 LAB — TROPONIN I
Troponin I: 0.01 ng/mL (ref 0.00–0.06)
Troponin I: 0.02 ng/mL (ref 0.00–0.06)
Troponin I: 0.02 ng/mL (ref 0.00–0.06)

## 2010-09-30 LAB — POCT CARDIAC MARKERS
CKMB, poc: 1 ng/mL (ref 1.0–8.0)
CKMB, poc: 1 ng/mL (ref 1.0–8.0)
Myoglobin, poc: 59.2 ng/mL (ref 12–200)
Myoglobin, poc: 75.6 ng/mL (ref 12–200)
Troponin i, poc: <0.05 ng/mL (ref 0.00–0.09)
Troponin i, poc: <0.05 ng/mL (ref 0.00–0.09)

## 2010-09-30 LAB — RAPID URINE DRUG SCREEN, HOSP PERFORMED
Amphetamines: NOT DETECTED
Barbiturates: NOT DETECTED
Benzodiazepines: NOT DETECTED
Cocaine: NOT DETECTED
Opiates: POSITIVE — AB
Tetrahydrocannabinol: POSITIVE — AB

## 2010-09-30 LAB — URINALYSIS, ROUTINE W REFLEX MICROSCOPIC
Bilirubin Urine: NEGATIVE
Glucose, UA: NEGATIVE mg/dL
Hgb urine dipstick: NEGATIVE
Ketones, ur: NEGATIVE mg/dL
Nitrite: NEGATIVE
Protein, ur: NEGATIVE mg/dL
Specific Gravity, Urine: 1.026 (ref 1.005–1.030)
Urobilinogen, UA: 0.2 mg/dL (ref 0.0–1.0)
pH: 5.5 (ref 5.0–8.0)

## 2010-09-30 LAB — HEMOGLOBIN A1C
Hgb A1c MFr Bld: 6.2 % — ABNORMAL HIGH (ref 4.6–6.1)
Mean Plasma Glucose: 131 mg/dL

## 2010-09-30 LAB — TSH: TSH: 1.277 u[IU]/mL (ref 0.350–4.500)

## 2010-10-03 LAB — COMPREHENSIVE METABOLIC PANEL
ALT: 29 U/L (ref 0–35)
AST: 25 U/L (ref 0–37)
Albumin: 3.8 g/dL (ref 3.5–5.2)
Alkaline Phosphatase: 127 U/L — ABNORMAL HIGH (ref 39–117)
BUN: 10 mg/dL (ref 6–23)
CO2: 28 mEq/L (ref 19–32)
Calcium: 9.4 mg/dL (ref 8.4–10.5)
Chloride: 104 mEq/L (ref 96–112)
Creatinine, Ser: 0.73 mg/dL (ref 0.4–1.2)
GFR calc Af Amer: >60 mL/min (ref >60)
GFR calc non Af Amer: >60 mL/min (ref >60)
Glucose, Bld: 102 mg/dL — ABNORMAL HIGH (ref 70–99)
Potassium: 4.2 mEq/L (ref 3.5–5.1)
Sodium: 137 mEq/L (ref 135–145)
Total Bilirubin: 0.6 mg/dL (ref 0.3–1.2)
Total Protein: 7.5 g/dL (ref 6.0–8.3)

## 2010-10-03 LAB — HEPATIC FUNCTION PANEL
ALT: 112 U/L — ABNORMAL HIGH (ref 0–35)
ALT: 31 U/L (ref 0–35)
ALT: 58 U/L — ABNORMAL HIGH (ref 0–35)
ALT: 98 U/L — ABNORMAL HIGH (ref 0–35)
AST: 24 U/L (ref 0–37)
AST: 43 U/L — ABNORMAL HIGH (ref 0–37)
AST: 58 U/L — ABNORMAL HIGH (ref 0–37)
AST: 75 U/L — ABNORMAL HIGH (ref 0–37)
Albumin: 3 g/dL — ABNORMAL LOW (ref 3.5–5.2)
Albumin: 3.3 g/dL — ABNORMAL LOW (ref 3.5–5.2)
Albumin: 3.4 g/dL — ABNORMAL LOW (ref 3.5–5.2)
Albumin: 3.4 g/dL — ABNORMAL LOW (ref 3.5–5.2)
Alkaline Phosphatase: 111 U/L (ref 39–117)
Alkaline Phosphatase: 133 U/L — ABNORMAL HIGH (ref 39–117)
Alkaline Phosphatase: 165 U/L — ABNORMAL HIGH (ref 39–117)
Alkaline Phosphatase: 170 U/L — ABNORMAL HIGH (ref 39–117)
Bilirubin, Direct: 0.1 mg/dL (ref 0.0–0.3)
Bilirubin, Direct: 0.2 mg/dL (ref 0.0–0.3)
Bilirubin, Direct: 0.2 mg/dL (ref 0.0–0.3)
Bilirubin, Direct: <0.1 mg/dL (ref 0.0–0.3)
Indirect Bilirubin: 0.5 mg/dL (ref 0.3–0.9)
Indirect Bilirubin: 0.5 mg/dL (ref 0.3–0.9)
Indirect Bilirubin: 0.6 mg/dL (ref 0.3–0.9)
Total Bilirubin: 0.4 mg/dL (ref 0.3–1.2)
Total Bilirubin: 0.6 mg/dL (ref 0.3–1.2)
Total Bilirubin: 0.7 mg/dL (ref 0.3–1.2)
Total Bilirubin: 0.8 mg/dL (ref 0.3–1.2)
Total Protein: 6.5 g/dL (ref 6.0–8.3)
Total Protein: 6.6 g/dL (ref 6.0–8.3)
Total Protein: 6.9 g/dL (ref 6.0–8.3)
Total Protein: 7.4 g/dL (ref 6.0–8.3)

## 2010-10-03 LAB — IRON AND TIBC
Iron: 22 ug/dL — ABNORMAL LOW (ref 42–135)
Saturation Ratios: 7 % — ABNORMAL LOW (ref 20–55)
TIBC: 320 ug/dL (ref 250–470)
UIBC: 298 ug/dL

## 2010-10-03 LAB — HEMOGLOBIN AND HEMATOCRIT, BLOOD
HCT: 32.4 % — ABNORMAL LOW (ref 36.0–46.0)
HCT: 33.2 % — ABNORMAL LOW (ref 36.0–46.0)
HCT: 33.4 % — ABNORMAL LOW (ref 36.0–46.0)
HCT: 33.5 % — ABNORMAL LOW (ref 36.0–46.0)
HCT: 33.8 % — ABNORMAL LOW (ref 36.0–46.0)
Hemoglobin: 10.3 g/dL — ABNORMAL LOW (ref 12.0–15.0)
Hemoglobin: 10.4 g/dL — ABNORMAL LOW (ref 12.0–15.0)
Hemoglobin: 10.5 g/dL — ABNORMAL LOW (ref 12.0–15.0)
Hemoglobin: 10.5 g/dL — ABNORMAL LOW (ref 12.0–15.0)
Hemoglobin: 10.8 g/dL — ABNORMAL LOW (ref 12.0–15.0)

## 2010-10-03 LAB — CBC
HCT: 33.2 % — ABNORMAL LOW (ref 36.0–46.0)
HCT: 34.6 % — ABNORMAL LOW (ref 36.0–46.0)
HCT: 37.8 % (ref 36.0–46.0)
Hemoglobin: 10.6 g/dL — ABNORMAL LOW (ref 12.0–15.0)
Hemoglobin: 11.2 g/dL — ABNORMAL LOW (ref 12.0–15.0)
Hemoglobin: 11.8 g/dL — ABNORMAL LOW (ref 12.0–15.0)
MCHC: 31.3 g/dL (ref 30.0–36.0)
MCHC: 31.8 g/dL (ref 30.0–36.0)
MCHC: 32.4 g/dL (ref 30.0–36.0)
MCV: 69.4 fL — ABNORMAL LOW (ref 78.0–100.0)
MCV: 69.6 fL — ABNORMAL LOW (ref 78.0–100.0)
MCV: 70.1 fL — ABNORMAL LOW (ref 78.0–100.0)
Platelets: 215 10*3/uL (ref 150–400)
Platelets: 226 10*3/uL (ref 150–400)
Platelets: 231 10*3/uL (ref 150–400)
RBC: 4.78 MIL/uL (ref 3.87–5.11)
RBC: 4.97 MIL/uL (ref 3.87–5.11)
RBC: 5.4 MIL/uL — ABNORMAL HIGH (ref 3.87–5.11)
RDW: 16.1 % — ABNORMAL HIGH (ref 11.5–15.5)
RDW: 16.1 % — ABNORMAL HIGH (ref 11.5–15.5)
RDW: 16.6 % — ABNORMAL HIGH (ref 11.5–15.5)
WBC: 11 10*3/uL — ABNORMAL HIGH (ref 4.0–10.5)
WBC: 6.8 10*3/uL (ref 4.0–10.5)
WBC: 8 10*3/uL (ref 4.0–10.5)

## 2010-10-03 LAB — DIFFERENTIAL
Basophils Absolute: 0 10*3/uL (ref 0.0–0.1)
Basophils Absolute: 0.1 10*3/uL (ref 0.0–0.1)
Basophils Relative: 0 % (ref 0–1)
Basophils Relative: 1 % (ref 0–1)
Eosinophils Absolute: 0 10*3/uL (ref 0.0–0.7)
Eosinophils Absolute: 0.2 10*3/uL (ref 0.0–0.7)
Eosinophils Relative: 0 % (ref 0–5)
Eosinophils Relative: 2 % (ref 0–5)
Lymphocytes Relative: 11 % — ABNORMAL LOW (ref 12–46)
Lymphocytes Relative: 25 % (ref 12–46)
Lymphs Abs: 1.2 10*3/uL (ref 0.7–4.0)
Lymphs Abs: 2 10*3/uL (ref 0.7–4.0)
Monocytes Absolute: 0.4 10*3/uL (ref 0.1–1.0)
Monocytes Absolute: 0.6 10*3/uL (ref 0.1–1.0)
Monocytes Relative: 5 % (ref 3–12)
Monocytes Relative: 5 % (ref 3–12)
Neutro Abs: 5.4 10*3/uL (ref 1.7–7.7)
Neutro Abs: 9.1 10*3/uL — ABNORMAL HIGH (ref 1.7–7.7)
Neutrophils Relative %: 68 % (ref 43–77)
Neutrophils Relative %: 83 % — ABNORMAL HIGH (ref 43–77)

## 2010-10-03 LAB — URINALYSIS, ROUTINE W REFLEX MICROSCOPIC
Bilirubin Urine: NEGATIVE
Glucose, UA: NEGATIVE mg/dL
Hgb urine dipstick: NEGATIVE
Ketones, ur: NEGATIVE mg/dL
Nitrite: NEGATIVE
Protein, ur: NEGATIVE mg/dL
Specific Gravity, Urine: 1.007 (ref 1.005–1.030)
Urobilinogen, UA: 1 mg/dL (ref 0.0–1.0)
pH: 5.5 (ref 5.0–8.0)

## 2010-10-03 LAB — LIPASE, BLOOD: Lipase: 17 U/L (ref 11–59)

## 2010-10-03 LAB — GLUCOSE, CAPILLARY: Glucose-Capillary: 124 mg/dL — ABNORMAL HIGH (ref 70–99)

## 2010-10-03 LAB — CK TOTAL AND CKMB (NOT AT ARMC)
CK, MB: 1.2 ng/mL (ref 0.3–4.0)
Relative Index: 0.9 (ref 0.0–2.5)
Total CK: 131 U/L (ref 7–177)

## 2010-10-03 LAB — TROPONIN I: Troponin I: <0.01 ng/mL (ref 0.00–0.06)

## 2010-10-03 LAB — THYROXINE BINDING GLOBULIN: Thyroxine Bind Glob: 16.9 ug/mL (ref 13.0–30.0)

## 2010-10-04 LAB — DIFFERENTIAL
Basophils Absolute: 0 10*3/uL (ref 0.0–0.1)
Basophils Relative: 0 % (ref 0–1)
Eosinophils Absolute: 0.1 10*3/uL (ref 0.0–0.7)
Eosinophils Relative: 1 % (ref 0–5)
Lymphocytes Relative: 29 % (ref 12–46)
Lymphs Abs: 2.4 10*3/uL (ref 0.7–4.0)
Monocytes Absolute: 0.3 10*3/uL (ref 0.1–1.0)
Monocytes Relative: 3 % (ref 3–12)
Neutro Abs: 5.6 10*3/uL (ref 1.7–7.7)
Neutrophils Relative %: 67 % (ref 43–77)

## 2010-10-04 LAB — COMPREHENSIVE METABOLIC PANEL
ALT: 25 U/L (ref 0–35)
AST: 23 U/L (ref 0–37)
Albumin: 3.7 g/dL (ref 3.5–5.2)
Alkaline Phosphatase: 120 U/L — ABNORMAL HIGH (ref 39–117)
BUN: 10 mg/dL (ref 6–23)
CO2: 26 mEq/L (ref 19–32)
Calcium: 8.9 mg/dL (ref 8.4–10.5)
Chloride: 103 mEq/L (ref 96–112)
Creatinine, Ser: 0.8 mg/dL (ref 0.4–1.2)
GFR calc Af Amer: >60 mL/min (ref >60)
GFR calc non Af Amer: >60 mL/min (ref >60)
Glucose, Bld: 137 mg/dL — ABNORMAL HIGH (ref 70–99)
Potassium: 3 mEq/L — ABNORMAL LOW (ref 3.5–5.1)
Sodium: 136 mEq/L (ref 135–145)
Total Bilirubin: 0.3 mg/dL (ref 0.3–1.2)
Total Protein: 6.8 g/dL (ref 6.0–8.3)

## 2010-10-04 LAB — CBC
HCT: 34 % — ABNORMAL LOW (ref 36.0–46.0)
Hemoglobin: 10.8 g/dL — ABNORMAL LOW (ref 12.0–15.0)
MCHC: 31.7 g/dL (ref 30.0–36.0)
MCV: 69.6 fL — ABNORMAL LOW (ref 78.0–100.0)
Platelets: 221 10*3/uL (ref 150–400)
RBC: 4.89 MIL/uL (ref 3.87–5.11)
RDW: 16.4 % — ABNORMAL HIGH (ref 11.5–15.5)
WBC: 8.4 10*3/uL (ref 4.0–10.5)

## 2010-10-04 LAB — URINALYSIS, ROUTINE W REFLEX MICROSCOPIC
Bilirubin Urine: NEGATIVE
Glucose, UA: NEGATIVE mg/dL
Hgb urine dipstick: NEGATIVE
Ketones, ur: NEGATIVE mg/dL
Nitrite: NEGATIVE
Protein, ur: NEGATIVE mg/dL
Specific Gravity, Urine: 1.002 — ABNORMAL LOW (ref 1.005–1.030)
Urobilinogen, UA: 0.2 mg/dL (ref 0.0–1.0)
pH: 6 (ref 5.0–8.0)

## 2010-10-04 LAB — GLUCOSE, CAPILLARY: Glucose-Capillary: 113 mg/dL — ABNORMAL HIGH (ref 70–99)

## 2010-10-04 LAB — LIPASE, BLOOD: Lipase: 21 U/L (ref 11–59)

## 2010-10-10 LAB — POCT I-STAT, CHEM 8
BUN: 13 mg/dL (ref 6–23)
Calcium, Ion: 1.14 mmol/L (ref 1.12–1.32)
Chloride: 104 mEq/L (ref 96–112)
Creatinine, Ser: 0.7 mg/dL (ref 0.4–1.2)
Glucose, Bld: 110 mg/dL — ABNORMAL HIGH (ref 70–99)
HCT: 37 % (ref 36.0–46.0)
Hemoglobin: 12.6 g/dL (ref 12.0–15.0)
Potassium: 3.2 mEq/L — ABNORMAL LOW (ref 3.5–5.1)
Sodium: 141 mEq/L (ref 135–145)
TCO2: 25 mmol/L (ref 0–100)

## 2010-10-10 LAB — CBC
HCT: 33.4 % — ABNORMAL LOW (ref 36.0–46.0)
Hemoglobin: 10.7 g/dL — ABNORMAL LOW (ref 12.0–15.0)
MCHC: 32.1 g/dL (ref 30.0–36.0)
MCV: 68.5 fL — ABNORMAL LOW (ref 78.0–100.0)
Platelets: 221 10*3/uL (ref 150–400)
RBC: 4.87 MIL/uL (ref 3.87–5.11)
RDW: 16.3 % — ABNORMAL HIGH (ref 11.5–15.5)
WBC: 9 10*3/uL (ref 4.0–10.5)

## 2010-10-10 LAB — DIFFERENTIAL
Basophils Absolute: 0 10*3/uL (ref 0.0–0.1)
Basophils Relative: 0 % (ref 0–1)
Eosinophils Absolute: 0.1 10*3/uL (ref 0.0–0.7)
Eosinophils Relative: 1 % (ref 0–5)
Lymphocytes Relative: 32 % (ref 12–46)
Lymphs Abs: 2.9 10*3/uL (ref 0.7–4.0)
Monocytes Absolute: 0.4 10*3/uL (ref 0.1–1.0)
Monocytes Relative: 4 % (ref 3–12)
Neutro Abs: 5.6 10*3/uL (ref 1.7–7.7)
Neutrophils Relative %: 63 % (ref 43–77)

## 2010-10-10 LAB — URINALYSIS, ROUTINE W REFLEX MICROSCOPIC
Bilirubin Urine: NEGATIVE
Glucose, UA: NEGATIVE mg/dL
Hgb urine dipstick: NEGATIVE
Ketones, ur: NEGATIVE mg/dL
Nitrite: NEGATIVE
Protein, ur: NEGATIVE mg/dL
Specific Gravity, Urine: 1.031 — ABNORMAL HIGH (ref 1.005–1.030)
Urobilinogen, UA: 0.2 mg/dL (ref 0.0–1.0)
pH: 6 (ref 5.0–8.0)

## 2010-10-16 ENCOUNTER — Telehealth: Payer: Self-pay | Admitting: Internal Medicine

## 2010-10-16 DIAGNOSIS — G43909 Migraine, unspecified, not intractable, without status migrainosus: Secondary | ICD-10-CM

## 2010-10-16 NOTE — Telephone Encounter (Signed)
Ok to refill x 3 

## 2010-10-16 NOTE — Telephone Encounter (Signed)
Refill- amitriptylin 25mg  tab. Take one-half to one tablet by mouth every day at bedtime. Qty 30. Last fill 11.29.10

## 2010-10-16 NOTE — Telephone Encounter (Signed)
Callback [phone]  Amitriptyline removed from medication list on 07/20/09. Spoke to pt and she states that she needs the medication that she was taking at bedtime for her migraines. Pt does not know the name of the medication and does not have an old bottle. Pt thinks she has been taking the medication regularly even though we have not refilled this medication in over a year.  Please advise.

## 2010-10-17 MED ORDER — AMITRIPTYLINE HCL 25 MG PO TABS: 25.0000 mg | ORAL_TABLET | Freq: Every day | ORAL | Status: DC

## 2010-10-17 NOTE — Telephone Encounter (Signed)
Medication refill sent to [facility]

## 2010-10-18 ENCOUNTER — Ambulatory Visit (INDEPENDENT_AMBULATORY_CARE_PROVIDER_SITE_OTHER): Payer: BC Managed Care – PPO | Admitting: Internal Medicine

## 2010-10-18 ENCOUNTER — Ambulatory Visit (HOSPITAL_BASED_OUTPATIENT_CLINIC_OR_DEPARTMENT_OTHER)
Admission: RE | Admit: 2010-10-18 | Discharge: 2010-10-18 | Disposition: A | Discharge status: Home / Self Care | Payer: BC Managed Care – PPO | Source: Ambulatory Visit | Attending: Internal Medicine | Admitting: Internal Medicine

## 2010-10-18 ENCOUNTER — Encounter: Payer: Self-pay | Admitting: Internal Medicine

## 2010-10-18 ENCOUNTER — Ambulatory Visit: Payer: BC Managed Care – PPO | Admitting: Family

## 2010-10-18 VITALS — BP 180/110 | HR 66 | Temp 98.1°F | Resp 16 | Ht 64.0 in | Wt 226.0 lb

## 2010-10-18 DIAGNOSIS — M545 Low back pain, unspecified: Secondary | ICD-10-CM

## 2010-10-18 DIAGNOSIS — M5412 Radiculopathy, cervical region: Secondary | ICD-10-CM | POA: Insufficient documentation

## 2010-10-18 DIAGNOSIS — R209 Unspecified disturbances of skin sensation: Secondary | ICD-10-CM | POA: Insufficient documentation

## 2010-10-18 DIAGNOSIS — M5416 Radiculopathy, lumbar region: Secondary | ICD-10-CM | POA: Insufficient documentation

## 2010-10-18 LAB — POCT URINALYSIS DIPSTICK
Bilirubin, UA: NEGATIVE
Blood, UA: NEGATIVE
Glucose, UA: NEGATIVE
Ketones, UA: NEGATIVE
Nitrite, UA: NEGATIVE
Spec Grav, UA: 1.03
Urobilinogen, UA: 0.2
WBC, UA: NEGATIVE
pH, UA: 5

## 2010-10-18 LAB — HM PAP SMEAR

## 2010-10-18 MED ORDER — HYDROMORPHONE HCL 2 MG PO TABS: 2.0000 mg | ORAL_TABLET | Freq: Two times a day (BID) | ORAL | Status: DC | PRN

## 2010-10-18 MED ORDER — PREDNISONE 10 MG PO TABS: ORAL_TABLET | ORAL | Status: DC

## 2010-10-18 NOTE — Progress Notes (Signed)
Subjective:    Patient ID: [name], female    DOB: Jun 03, 1961, 50 y.o.   MRN: [id number]  HPI  Pt c/o severe back pain x 1 month.  She describes as constant sharp pain.   No aggravating factors.   No relief with tramadol or muscle relaxers.  She denies urinary complaints.  She does not recall doing anything specific to  strain her back  Review of Systems    negative for fever or chills Past Medical History  Diagnosis Date  . Atypical chest pain   . Hyperlipidemia   . Hypertension   . GERD (gastroesophageal reflux disease)   . Diabetes mellitus, type II   . Constipation   . Routine general medical examination at a health care facility   . Gastroparesis   . Iron deficiency anemia   . Esophageal stricture   . Migraine headache   . Endometriosis   . Cyst, ovarian   . Peptic ulcer disease   . Non-compliance     History   Social History  . Marital Status: Married    Spouse Name: N/A    Number of Children: N/A  . Years of Education: N/A   Occupational History  . Not on file.   Social History Main Topics  . Smoking status: Former Games developer  . Smokeless tobacco: Not on file   Comment: Quit in 1996  . Alcohol Use: Not on file  . Drug Use: Not on file  . Sexually Active: Not on file   Other Topics Concern  . Not on file   Social History Narrative   Married Has 2 grown children.  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  She was a pack a day smoker for approximately 10 years.Alcohol use-yes: Social Daily Caffeine Use:6 pack of pepsi daily  Illicit Drug Use - no Patient does not get regular exercise.    Smoking Status:  quit    Past Surgical History  Procedure Date  . Abdominal exploration surgery     w/bso   . Knee surgery 2005     left knee  . Total abdominal hysterectomy   . Cholecystectomy   . Cardiac catheterization 2009    mild non obstructive CAD    Family History  Problem Relation Age of Onset  . Hypertension  Mother   . Hypertension Father   . Diabetes Father   . Prostate cancer Father   . Diabetes Sister   . Diabetes      grandmother  . Other      no FH of colon cancer,  . Kidney disease Father     Allergies  Allergen Reactions  . Ace Inhibitors     REACTION: chronic cough    Current Outpatient Prescriptions on File Prior to Visit  Medication Sig Dispense Refill  . amitriptyline (ELAVIL) 25 MG tablet Take 1 tablet (25 mg total) by mouth at bedtime.  30 tablet  3    BP 180/110  Pulse 66  Temp(Src) 98.1 F (36.7 C) (Oral)  Resp 16  Ht 5\' 4"  (1.626 m)  Wt 226 lb (102.513 kg)  BMI 38.79 kg/m2  SpO2 100%    Objective:   Physical Exam     Constitutional: Appears well-developed and well-nourished. No distress.  Cardiovascular: Normal rate, regular rhythm and normal heart sounds.  Exam reveals no gallop and no friction rub.   No murmur heard. Pulmonary/Chest: Effort normal and breath sounds normal.  No wheezes. No rales.  Neurological: Alert. No cranial nerve deficit. decrease in ROM of lumbar spine.   No LE weakness.     Assessment & Plan:

## 2010-10-19 ENCOUNTER — Telehealth: Payer: Self-pay | Admitting: *Deleted

## 2010-10-19 NOTE — Telephone Encounter (Signed)
Call returned to patient at [phone], she was advised per Dr Artist Pais instructions

## 2010-10-19 NOTE — Telephone Encounter (Signed)
Please have pt try taking 500 mg of tylenol with dilaudid tablet

## 2010-10-19 NOTE — Telephone Encounter (Signed)
Call placed to patient to inform her of her test results, she wanted Dr Artist Pais to know that the pain medication is not working for her, and she would like to know if there is something else that she could take.

## 2010-10-25 ENCOUNTER — Encounter (HOSPITAL_BASED_OUTPATIENT_CLINIC_OR_DEPARTMENT_OTHER): Payer: BC Managed Care – PPO | Admitting: Hematology & Oncology

## 2010-10-25 ENCOUNTER — Ambulatory Visit (INDEPENDENT_AMBULATORY_CARE_PROVIDER_SITE_OTHER): Payer: BC Managed Care – PPO | Admitting: Internal Medicine

## 2010-10-25 ENCOUNTER — Other Ambulatory Visit: Payer: Self-pay | Admitting: Hematology & Oncology

## 2010-10-25 ENCOUNTER — Encounter: Payer: Self-pay | Admitting: Internal Medicine

## 2010-10-25 VITALS — BP 154/90 | HR 63 | Temp 98.0°F | Resp 18 | Wt 230.0 lb

## 2010-10-25 DIAGNOSIS — M545 Low back pain, unspecified: Secondary | ICD-10-CM

## 2010-10-25 DIAGNOSIS — I10 Essential (primary) hypertension: Secondary | ICD-10-CM

## 2010-10-25 DIAGNOSIS — D509 Iron deficiency anemia, unspecified: Secondary | ICD-10-CM

## 2010-10-25 LAB — CBC WITH DIFFERENTIAL (CANCER CENTER ONLY)
BASO#: 0 10*3/uL (ref 0.0–0.2)
BASO%: 0.2 % (ref 0.0–2.0)
EOS%: 0.5 % (ref 0.0–7.0)
Eosinophils Absolute: 0.1 10*3/uL (ref 0.0–0.5)
HCT: 36 % (ref 34.8–46.6)
HGB: 11.5 g/dL — ABNORMAL LOW (ref 11.6–15.9)
LYMPH#: 5.7 10*3/uL — ABNORMAL HIGH (ref 0.9–3.3)
LYMPH%: 38.2 % (ref 14.0–48.0)
MCH: 22.4 pg — ABNORMAL LOW (ref 26.0–34.0)
MCHC: 31.9 g/dL — ABNORMAL LOW (ref 32.0–36.0)
MCV: 70 fL — ABNORMAL LOW (ref 81–101)
MONO#: 0.9 10*3/uL (ref 0.1–0.9)
MONO%: 5.9 % (ref 0.0–13.0)
NEUT#: 8.2 10*3/uL — ABNORMAL HIGH (ref 1.5–6.5)
NEUT%: 55.2 % (ref 39.6–80.0)
Platelets: 263 10*3/uL (ref 145–400)
RBC: 5.14 10*6/uL (ref 3.70–5.32)
RDW: 14.6 % (ref 11.1–15.7)
WBC: 14.9 10*3/uL — ABNORMAL HIGH (ref 3.9–10.0)

## 2010-10-25 LAB — CHCC SATELLITE - SMEAR

## 2010-10-25 MED ORDER — GABAPENTIN 100 MG PO CAPS: ORAL_CAPSULE | ORAL | Status: DC

## 2010-10-25 MED ORDER — FUROSEMIDE 20 MG PO TABS: 20.0000 mg | ORAL_TABLET | Freq: Every day | ORAL | Status: DC

## 2010-10-25 NOTE — Assessment & Plan Note (Signed)
Persistent low back pain from strain MRI of LS spine: No significant finding. Mild desiccation and bulging of the  discs at L4-5 and L5-S1. No stenosis of the canal or foramina.  No improvement with prednisone or dilaudid  Trial of gabapentin Refer to PT for eval and tx

## 2010-10-25 NOTE — Assessment & Plan Note (Signed)
Poorly controlled.  Compliance is questionable Add lasix BP Readings from Last 3 Encounters:  10/25/10 154/90  10/18/10 180/110  08/03/10 116/80

## 2010-10-25 NOTE — Assessment & Plan Note (Signed)
>>  ASSESSMENT AND PLAN FOR LOW BACK PAIN WRITTEN ON 10/25/2010 10:14 PM BY YOO, DOE-HYUN R, DO  Persistent low back pain from strain MRI of LS spine: No significant finding. Mild desiccation and bulging of the  discs at L4-5 and L5-S1. No stenosis of the canal or foramina.  No improvement with prednisone  or dilaudid   Trial of gabapentin  Refer to PT for eval and tx

## 2010-10-25 NOTE — Patient Instructions (Addendum)
Our office will contact you re:  Physical therapy referral Stop taking cyclobenzaprine, dilaudid Please complete the following lab tests before your next follow up appointment: BMET - 401.9

## 2010-10-25 NOTE — Progress Notes (Signed)
Subjective:    Patient ID: [name], female    DOB: Apr 09, 1961, 50 y.o.   MRN: [id number]  HPI 50 y/o AA female for follow up re:  Low back pain.  No improvement in symptoms despite taking dilaudid and prednisone taper.  She describes sharp, aching pain.  Hurts to sit and stand.  Symptoms are constant and radiates to right leg.  MRI of LS - reviewed.  No likely significant finding. Mild desiccation and bulging of the  discs at L4-5 and L5-S1. No stenosis of the canal or foramina.   Htn - elevated.  Pt reports taking her meds.  She has not picked up her rx from her pharm.  She reports taking her husbands same BP meds (samples).   Review of Systems No fever or chills  Past Medical History  Diagnosis Date  . Atypical chest pain   . Hyperlipidemia   . Hypertension   . GERD (gastroesophageal reflux disease)   . Diabetes mellitus, type II   . Constipation   . Routine general medical examination at a health care facility   . Gastroparesis   . Iron deficiency anemia   . Esophageal stricture   . Migraine headache   . Endometriosis   . Cyst, ovarian   . Peptic ulcer disease   . Non-compliance     History   Social History  . Marital Status: Married    Spouse Name: N/A    Number of Children: N/A  . Years of Education: N/A   Occupational History  . Not on file.   Social History Main Topics  . Smoking status: Former Games developer  . Smokeless tobacco: Not on file   Comment: Quit in 1996  . Alcohol Use: Not on file  . Drug Use: Not on file  . Sexually Active: Not on file   Other Topics Concern  . Not on file   Social History Narrative   Married Has 2 grown children.  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  She was a pack a day smoker for approximately 10 years.Alcohol use-yes: Social Daily Caffeine Use:6 pack of pepsi daily  Illicit Drug Use - no Patient does not get regular exercise.    Smoking Status:  quit    Past Surgical  History  Procedure Date  . Abdominal exploration surgery     w/bso   . Knee surgery 2005     left knee  . Total abdominal hysterectomy   . Cholecystectomy   . Cardiac catheterization 2009    mild non obstructive CAD    Family History  Problem Relation Age of Onset  . Hypertension Mother   . Hypertension Father   . Diabetes Father   . Prostate cancer Father   . Diabetes Sister   . Diabetes      grandmother  . Other      no FH of colon cancer,  . Kidney disease Father     Allergies  Allergen Reactions  . Ace Inhibitors     REACTION: chronic cough    Current Outpatient Prescriptions on File Prior to Visit  Medication Sig Dispense Refill  . amitriptyline (ELAVIL) 25 MG tablet Take 1 tablet (25 mg total) by mouth at bedtime.  30 tablet  3  . esomeprazole (NEXIUM) 40 MG capsule Take 40 mg by mouth. 1 capsule each day 30 minutes before meal       . ondansetron (ZOFRAN) 4 MG tablet Take 4 mg by  mouth 2 (two) times daily as needed. For nausea       . potassium chloride SA (K-DUR,KLOR-CON) 20 MEQ tablet Take 20 mEq by mouth daily.        . valsartan-hydrochlorothiazide (DIOVAN-HCT) 160-12.5 MG per tablet Take 1 tablet by mouth daily.        Marland Kitchen DISCONTD: cyclobenzaprine (FLEXERIL) 10 MG tablet Take 10 mg by mouth at bedtime as needed.        Marland Kitchen DISCONTD: HYDROmorphone (DILAUDID) 2 MG tablet Take 1 tablet (2 mg total) by mouth every 12 (twelve) hours as needed for pain.  30 tablet  0  . DISCONTD: predniSONE (DELTASONE) 10 MG tablet 4 tabs once daily x 4 days, then 3 tabs once daily x 2 days, then 2 tabs once daily x 2 days, then 1 tab once daily x 2 days  28 tablet  0    BP 154/90  Pulse 63  Temp(Src) 98 F (36.7 C) (Oral)  Resp 18  Wt 230 lb (104.327 kg)  SpO2 100%       Objective:   Physical Exam  Constitutional: She is oriented to person, place, and time. She appears well-developed and well-nourished.  Cardiovascular: Normal rate, regular rhythm and normal heart sounds.    Pulmonary/Chest: Effort normal and breath sounds normal. She has no wheezes. She has no rales.  Musculoskeletal: She exhibits no edema.  Neurological: She is oriented to person, place, and time. She has normal reflexes. No cranial nerve deficit. She exhibits normal muscle tone.  Skin: Skin is warm.          Assessment & Plan:

## 2010-10-26 LAB — RETICULOCYTES (CHCC)
ABS Retic: 41.8 10*3/uL (ref 19.0–186.0)
RBC.: 5.23 MIL/uL — ABNORMAL HIGH (ref 3.87–5.11)
Retic Ct Pct: 0.8 % (ref 0.4–3.1)

## 2010-10-26 LAB — IRON AND TIBC
%SAT: 21 % (ref 20–55)
Iron: 64 ug/dL (ref 42–145)
TIBC: 305 ug/dL (ref 250–470)
UIBC: 241 ug/dL

## 2010-10-26 LAB — VITAMIN D 25 HYDROXY (VIT D DEFICIENCY, FRACTURES): Vit D, 25-Hydroxy: 21 ng/mL — ABNORMAL LOW (ref 30–89)

## 2010-10-26 LAB — FERRITIN: Ferritin: 407 ng/mL — ABNORMAL HIGH (ref 10–291)

## 2010-11-01 ENCOUNTER — Other Ambulatory Visit: Payer: Self-pay | Admitting: Internal Medicine

## 2010-11-01 DIAGNOSIS — I10 Essential (primary) hypertension: Secondary | ICD-10-CM

## 2010-11-03 ENCOUNTER — Ambulatory Visit: Payer: BC Managed Care – PPO | Attending: Internal Medicine | Admitting: Physical Therapy

## 2010-11-03 DIAGNOSIS — M545 Low back pain, unspecified: Secondary | ICD-10-CM | POA: Insufficient documentation

## 2010-11-03 DIAGNOSIS — M2569 Stiffness of other specified joint, not elsewhere classified: Secondary | ICD-10-CM | POA: Insufficient documentation

## 2010-11-03 DIAGNOSIS — IMO0001 Reserved for inherently not codable concepts without codable children: Secondary | ICD-10-CM | POA: Insufficient documentation

## 2010-11-07 ENCOUNTER — Ambulatory Visit: Payer: BC Managed Care – PPO | Admitting: Rehabilitation

## 2010-11-07 NOTE — Op Note (Signed)
[name], HAERTEL NO.:  [id number]   MEDICAL RECORD NO.:  [id number]          PATIENT TYPE:  OIB   LOCATION:  5128                         FACILITY:  [facility]   PHYSICIAN:  Hermelinda Medicus, M.D.   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   POSTOPERATIVE DIAGNOSIS:  Right otitis media with tympanic membrane  superior granulation tissue cholesteatoma with mastoiditis lifelong  history.   OPERATION:  Right mastoid tympanoplasty.   SURGEON:  Hermelinda Medicus, M.D.   ANESTHESIA:  General endotracheal with Dr. Ivin Booty.   The patient is aware of the risks and gains.  She is aware that she  could have facial nerve issues, vertigo issues, or hearing issues  postoperatively.  She is also aware that she could have bleeding or  taste change or further infection.  She is diabetic and does have labile  hypertension which has been completely evaluated.   DESCRIPTION OF PROCEDURE:  The patient was placed in the supine position  under general endotracheal anesthesia. The head was turned, the right  ear was elevated and exposed, and the postauricular area was shaved  appropriately.  The prep was with Betadine and the usual ear drape and  head drape was used.  Once this was completed, then the scope was  brought into place and 1% Xylocaine with epinephrine was injected in the  four quadrants of the external ear canal.  The tympanotomy incision was  carried out and the posterior canal wall skin was taken down to the  middle ear and we examined the middle ear where, inferiorly, things  looked quite reasonably good and the ossicular chain appeared to be  intact.  Superiorly and posteriorly, she had a very thick canal skin and  tympanic  membrane with granulation tissue and what appeared to be pale  cholesteatomatous type debris in that pars flaccida region.  We used the  Bellucci scissors and the of four and the cups to distract this debris  and we excised this. We also even had to excise this from a small area  of the chorda tympani but we had this intact.  Once this was completed,  the tympanic membrane and this canal skin was much thinner and we  removed all this infected looking granulation tissue in that posterior  superior aspect of the canal and tympanic membrane. After this was  completed, we looked back into the more posterior area around the  ossicular chain and found it to be in good condition so we left that and  no further procedure was carried out. We then put our attention toward  the mastoid were a postauricular incision was made. The Lembert was used  to extend the dissection anteriorly and posteriorly. The external ear  canal could then be seen and the exposure was complete. The self-  retaining retractors were placed and the using the straight drill bit  with suction irrigation, the mastoidectomy was begun.  The bone was  found to be very thicken and osteitic, however, we worked down into the  mastoid and found some dark fluid. Also, inferiorly, we found a large  cyst that we removed the mucous membrane from this and the mastoid tip  we could see that on CAT scan and then we removed, also, cystic and  granulation tissue from the antrum and attic region as we worked our way  down through.  We were able to leave good bone around the sigmoid sinus  and the tegmen. The sinodural angle we cleaned out very well and moved  up anteriorly to the antrum and attic region, finding no more  cholesteatoma but just granulation tissue.  The horizontal semicircular  canal was cleaned and was uneroded and the incus and head of the incus  was in good condition.  Once we cleaned out all the cells and all the  fluid, we  then put Gelfoam with Ciprodex in the antrum and also  inferiorly in the inferior aspect of the mastoid and then began our  closure with double layer chromic catgut and then skin clips were used.  The ear was then reexamined. The tympanic membrane was reflected back,  suctioned the middle ear.  We then placed Gelfoam lateral to this after  we put the tympanic membrane back in its original position. Then, this  followed with Ciprodex Gelfoam and then Neosporin ointment and cotton in  the external ear canal.  The mastoid dressing was applied.  The patient  was awakened, tolerated the procedure well, and is doing well  postoperatively.  Follow-up will be overnight because of her cardiac  issues, 23-hour observation, but will see her in 5 days, then 10 days,  then 3 weeks, 6 weeks, 3 months, 6 months, a year and annually after  that.           ______________________________  Hermelinda Medicus, M.D.     JC/MEDQ  D:  08/08/2007  T:  08/09/2007  Job:  [id number]   cc:   Barbette Hair. Artist Pais, DO

## 2010-11-07 NOTE — Consult Note (Signed)
[name], [name]              ACCOUNT NO.:  [id number]   MEDICAL RECORD NO.:  [id number]          PATIENT TYPE:  INP   LOCATION:  1301                         FACILITY:  [facility]   PHYSICIAN:  Sharlet Salina T. Hoxworth, M.D.DATE OF BIRTH:  February 15, 1961   DATE OF CONSULTATION:  10/27/2008  DATE OF DISCHARGE:                                 CONSULTATION   CHIEF COMPLAINT:  Right upper quadrant abdominal pain and nausea.   HISTORY OF PRESENT ILLNESS:  I was asked by Dr. Claudette Head to  evaluate [name].  She is a pleasant 50 year old African female  admitted to the [facility] GI service for persistent abdominal pain and  nausea.  The patient states she was in her usual state of health until  approximately 5-6 days ago.  At that time she had the gradual onset of  epigastric and right upper quadrant abdominal pain and nausea.  Initially she had some diarrhea but this promptly went away.  The pain  became steadily worse over the next couple of days and she presented to  the emergency room on May 1 and was evaluated and released.  However,  the pain continued and she presented to Dr. Juanda Chance of [facility] and was  admitted to [facility] on Oct 25, 2008.  The patient states the  pain is sharp, located in her epigastrium and radiates around her right  rib cage to her right upper back.  She initially had vomiting, but now  remains persistently nauseated without vomiting.  She has not had a  bowel movement since admission.  There is no fever or chills, or  jaundice.  The patient denies history of any similar pain or abdominal  symptoms.  She does have a history of reflux esophagitis and esophageal  stricture but had typical heartburn and dysphagia with this totally  different concurrence of symptoms.  There is also a history of chart  review of gastroparesis but a gastric emptying study last year was  normal.  The pain does seem to be exacerbated by eating.  It is not any  better over a  number of days and is described as fairly severe about  6/10.   PAST MEDICAL HISTORY:  Previous surgery includes hysterectomy and then  bilateral salpingo-oophorectomy through Pfannenstiel incision.  She has  also had left knee surgery and is disabled due to arthritis and the  pain.  Medically she is followed for type 2 diabetes, but controls this  as best she can with her diet not wishing to take medications.  She has  a history of H pylori, anemia and has a diagnosis of gastroparesis  although negative emptying study as above.  She is also followed for  hyperlipidemia and mild hypertension.  She has had chest pain with an  essentially normal cardiac cath.  She has a known right hepatic  hemangioma which has been stable.   MEDICATIONS ON ADMISSION:  1. Crestor 10 daily.  2. Benicar 10 daily.   ALLERGIES:  No drug allergies.   SOCIAL HISTORY:  She is disabled, quit cigarettes in 1996, occasional  alcohol.  FAMILY HISTORY:  Positive for diabetes, hypertension.   REVIEW OF SYSTEMS:  GENERAL:  No fever, chills, weight change.  HEENT:  Denies vision, hearing, swallowing problems.  RESPIRATORY:  No shortness  of breath, cough, wheezing.  CARDIAC:  She has a history of chest pain,  nothing recent, no palpitations.  BREAST:  No lumps, tenderness or  discharge.  ABDOMEN/GI:  As above.  GU:  No urinary burning or  frequency.  SKIN:  She has a chronic rash on her hands and legs without  specific diagnosis after evaluation.  MUSCULOSKELETAL:  Significant  chronic back and joint pain, particularly left knee.   PHYSICAL EXAMINATION:  VITAL SIGNS:  She is afebrile.  Blood pressure  153/88, pulse 57, respiration 18, O2 sat 96% on room air.  GENERAL:  She is a mildly overweight African American female in no acute  distress.  SKIN:  There is a nodular, smooth, whitish rash over the back of both  hands and of her anterior lower extremities.  Warm and dry.  HEENT:  No palpable mass or  thyromegaly.  Sclerae nonicteric.  Oropharynx clear.  LYMPH NODES:  No cervical, subclavicular or inguinal nodes palpable.  LUNGS:  Clear without wheezing or increased work of breathing.  CARDIAC:  Regular rate and rhythm.  No murmurs, no edema.  Peripheral  pulses intact.  ABDOMEN:  Mildly obese, no hernias.  There is moderate epigastric and  right upper quadrant tenderness with some guarding, no palpable masses  or splenomegaly noted.  EXTREMITIES:  No joint swelling or marked deformity.  NEUROLOGIC:  Alert, oriented, motor and sensory exams grossly normal.   LABORATORY:  White count mildly elevated 11, hemoglobin 11.8.  LFTs  significant only for minimally elevated alkaline phosphatase of 127 on  presentation and normal on the repeat on 05/03.  Urinalysis  unremarkable.  The troponin normal, lipase 17. Imaging:  A CT scan of  the abdomen and pelvis 05/02 reveals a 5.7 x 6.6 mass in the right  hepatic lobe consistent with hemangioma unchanged from previous studies.  No other acute findings in the abdomen or pelvis.  Ultrasound of the  abdomen 05/05 shows no gallstones, gallbladder wall thickening or  pericholecystic fluid.  Mild fatty liver noted.  CCK stimulated HIDA  scan 05/05 shows filling of the gallbladder but significantly decreased  ejection fraction of 8% and there is exacerbation of her abdominal pain  with CCK injection.   EGD performed 05/04 was negative except for some slightly nodular mucosa  in the duodenum which was biopsied showing only Brunner gland  hyperplasia.   ASSESSMENT AND PLAN:  A 50 year old female with persistent, fairly  severe, right upper quadrant, right-sided back pain, very suggestive of  biliary tract disease.  She has had a thorough negative workup except  for a decreased ejection fraction on HIDA scan.  This would seemed  consistent with biliary dyskinesia/chronic cholecystitis.  No other  cause for pain has been identified.  I discussed this at  length with the  patient and her family.  We discussed the cholecystectomy in this  setting often produces relief of symptoms, but it is not reliable and  could result in no improvement.  Complications from surgery including  bleeding, infection, bile leak, bile duct injury and anesthetic  complications were discussed.  Options of surgical management versus  symptomatic treatment and follow-up were discussed.  She feel strongly  that the pain is not tolerable and she preferred to proceed with surgery  in  an effort to relieve her pain.  I think this is reasonable in this  setting and will plan laparoscopic cholecystectomy with cholangiogram.      Sharlet Salina T. Hoxworth, M.D.  Electronically Signed     BTH/MEDQ  D:  10/27/2008  T:  10/28/2008  Job:  [id number]

## 2010-11-07 NOTE — Discharge Summary (Signed)
NAMEVENITA, [name]              ACCOUNT NO.:  [id number]   MEDICAL RECORD NO.:  [id number]          PATIENT TYPE:  INP   LOCATION:  1301                         FACILITY:  [facility]   PHYSICIAN:  Wilhemina Bonito. Marina Goodell, MD      DATE OF BIRTH:  1961-06-21   DATE OF ADMISSION:  10/25/2008  DATE OF DISCHARGE:  11/01/2008                               DISCHARGE SUMMARY   ADMISSION DIAGNOSES:  69. A 50 year old female with acute epigastric and right upper quadrant      abdominal pain.  2. Large right hepatic lobe hemangioma, question symptomatic.  3. Iron deficiency anemia.  4. History of esophageal stricture.  5. Hyperlipidemia.  6. Hypertension.   DISCHARGE DIAGNOSES:  1. Acute abdominal pain secondary to chronic cholecystitis/biliary      dyskinesia status post laparoscopic cholecystectomy this admission.  2. Iron deficiency anemia, previous negative      esophagogastroduodenoscopy with exception of nodular mucosa in the      descending duodenum.  3. Large right hepatic lobe hemangioma.  4. Iron deficiency anemia.  5. History of esophageal stricture.  6. Hyperlipidemia.  7. Hypertension.   CONSULTATIONS:  Surgery, Ardeth Sportsman, MD.   PROCEDURES:  1. HIDA scan Oct 31, 2008.  2. HIDA scan Oct 27, 2008.  3. Upper abdominal ultrasound.   BRIEF HISTORY:  [name] is a pleasant 50 year old African American  female known to Dr. Arlyce Dice who has history of GERD, prior esophageal  stricture, chronic constipation and gastroparesis.  She has also had a  previously documented right hepatic lobe hemangioma of around 5 cm.  She  was seen on the day of admission in the office with complaints of acute  abdominal pain x 5-6 days.  This has been progressive to the point that  she went to the emergency room on Oct 24, 2008.  She describes the pain  is located in her upper abdomen, primarily right-sided with some  radiation around into her back.  It has been associated with nausea,  vomiting, loose stools  and sweats.  She says the pain is sharp and she  rates it as 8/10 in intensity.  Labs in the ER on Oct 24, 2008 were  unremarkable with the exception of a WBC of 11, hemoglobin 11.8.  CT  scan was done which was again negative with the exception of a 6.6 x 5  cm right hepatic lobe hemangioma.  She continues to hurt, has been  unable to eat and at this time is admitted for pain control and further  diagnostic evaluation.   LABORATORY DATA:  Laboratory studies on admission Oct 25, 2008, showed  WBC of 8, hemoglobin 10.6, hematocrit of 33.2, MCV of 69, platelets 215,  albumin is 3.4.  Liver function studies were normal.  Thyroid binding  globulin 6.9,  serum iron 22, TIBC of 320, iron saturation of 7.  Urinalysis negative.  H and H on Oct 28, 2008:  Hemoglobin 10.8,  hematocrit of 33.8.  Follow up on Oct 30, 2008:  WBC of 6.8, hemoglobin  11.2, hematocrit of 34.6.  Follow up LFTs on  Oct 30, 2008 show an AST of  43, ALT of 58, alk phos of 133, total bilirubin of 0.4.  On Nov 01, 2008:  Total bilirubin 0.7, alk phos 170, ALT of 98 and AST of 58.   DIAGNOSTICS:  1. CT scan of the abdomen and pelvis was done prior to admission as      listed above.  2. Abdominal ultrasound on Oct 27, 2008 revealed no gallstones or      gallbladder wall thickening, common bile duct normal, mild      increased echodensity of the liver and enlarged hypoechoic mass in      the posterior right hepatic lobe 7.4 x 7.1 cm consistent with a      giant hemangioma.  3. HIDA scan on Oct 27, 2008 showed an ejection fraction of 8%.  4. Cholangiogram on Oct 28, 2008 normal.  5. HIDA scan on Oct 31, 2008, no evidence of bile leak, satisfactory      uptake.   HOSPITAL COURSE:  The patient was admitted to the service of Dr. Russella Dar  who was covering the hospital.  She was placed on IV fluids, given IV  pain control and antiemetics.  She underwent upper abdominal ultrasound  which was unremarkable.  Her pain persisted.  She is known  to have a  large right hepatic lobe hemangioma posteriorly, however, this was not  felt to be the source of her pain.  She then underwent HIDA scan which  showed a significantly decreased ejection fraction of 8%.  It was felt  that she may have biliary dyskinesia or chronic cholecystitis.  Surgery  was contacted.  She was seen initially by Dr. Johna Sheriff and then  scheduled for laparoscopic cholecystectomy by Dr. Michaell Cowing on Oct 28, 2008.  She tolerated this procedure well and was felt to have chronic  cholecystitis.  She continued to complain of some mild nausea  postoperatively and some abdominal pain.  By Oct 30, 2008, she was  feeling a little bit better, still having some pain.  Decision was made  to pursue HIDA scan which was done on Oct 31, 2008.  This was negative  for any evidence of bile leak.  By Nov 01, 2008, she was improved and  was allowed discharge to home with instructions to follow up with Dr.  Michaell Cowing as scheduled and to follow up with Dr. Arlyce Dice in 1 month.   DISCHARGE MEDICATIONS:  1. She was to continue her Crestor 10 mg daily.  2. Benicar 10 mg daily.  3. Protonix 40 mg daily.  4. Given Percocet q.4 h. as needed for pain.   CONDITION ON DISCHARGE:  Stable and improved.      Amy Esterwood, PA-C      John N. Marina Goodell, MD  Electronically Signed    AE/MEDQ  D:  01/21/2009  T:  01/21/2009  Job:  [id number]   cc:   Barbette Hair. Arlyce Dice, MD,FACG  [address]  [place]  [place] [address]   Ardeth Sportsman, MD  [address] [place] [address]

## 2010-11-07 NOTE — Assessment & Plan Note (Signed)
[facility]                            CARDIOLOGY OFFICE NOTE   NAME:Skeens, MONTA MAIORANA                     MRN:          [id number]  DATE:02/19/2008                            DOB:          [birth year]    HISTORY OF PRESENT ILLNESS:  [name] is a pleasant 50 year old  African American female with past medical history significant for  hypertension, gastroesophageal reflux disease, gastroparesis, H. pylori,  peptic ulcer disease, microscopic anemia, hyperlipidemia, and diabetes  mellitus, who originally presented to my office on January 29, 2008 with  complaints of left-sided chest discomfort that radiated to her left  shoulder and arm.  The discomfort occurred both with exertion and at  rest and was typically associated with diaphoresis and shortness of  breath.  Because of her multiple cardiac risk factors, I elected to  perform a diagnostic left heart catheterization.  This was performed on  February 06, 2008 and showed only mild coronary plaque disease in the mid  LAD.  The circumflex coronary artery and the right coronary artery were  free of any occlusive disease.  The left ventricle showed hyperdynamic  systolic function with an ejection fraction of 65-70%.  Left ventricular  pressure was significantly elevated at 198/21 with an end-diastolic  pressure of 27.  Aortic pressure during the case was 193/101.  Following  the catheterization, I felt that the most likely reason for her chest  pain was elevated cardiac filling pressures and elevated systolic blood  pressure.  I did ask her to take Lopressor 25 mg twice daily and gave  her a prescription for this before she left the catheterization lab  recovery area.  She did not have this prescription filled and stated  that she was already on blood pressure medicines at home.  She has no  new complaints today.  Denies any chest pain over the last several  weeks.  She does note that she has felt continued  level of mild fatigue  over the last few months.  She has no specific complaints and denies  shortness of breath, palpitations, near syncope, syncope, orthopnea,  PND, or lower extremity edema.   CURRENT MEDICATIONS:  1. Crestor 20 mg once daily.  2. Benicar 20 mg once daily.  3. Amlodipine 10 mg once daily.   PHYSICAL EXAMINATION:  GENERAL:  She is a pleasant young African-  American female in no acute distress.  VITAL SIGNS:  Her blood pressure today is 116/74, pulse 85 and regular,  respirations 12 and unlabored.  NECK:  No JVD.  No carotid bruits.  No lymphadenopathy.  No thyromegaly.  SKIN:  Warm and dry.  HEENT:  Oropharynx clear.  LUNGS:  Clear to auscultation bilaterally without wheezes, rhonchi, or  crackles noted.  CARDIOVASCULAR:  Regular rate and rhythm without murmurs, gallops, or  rubs noted.  ABDOMEN:  Soft and nontender.  Bowel sounds present.  EXTREMITIES:  No evidence of edema.  Pulses are 2+ in all extremities.   DIAGNOSTIC STUDIES:  1. A 12-lead electrocardiogram obtained in our office today shows      normal sinus rhythm  with voltage criteria for left ventricular      hypertrophy.  There are nonspecific T-wave abnormalities noted.      All of her intervals are normal.  2. Cardiac catheterization performed on February 06, 2008 showed plaque      disease in the mid LAD, but otherwise no coronary artery disease.      Left ventricle was noted to be normal with an ejection fraction of      65-70%.  There was significantly elevated aortic pressure of      193/101 and left ventricular pressure of 198/21 with an end-      diastolic pressure of 27.   ASSESSMENT/PLAN:  [name] is a pleasant 50 year old African-American  female with multiple risk factors for coronary artery disease including  hypertension, hyperlipidemia, obesity, and diabetes, who recently  underwent a left heart catheterization that showed only mild coronary  plaque disease in the mid LAD.  She is  unable to take aspirin secondary  to history of ulcers with aggravation while using aspirin.  I will  continue her Benicar and Norvasc for blood pressure control and will add  Lopressor 25 mg twice daily with her recent diagnosis of coronary artery  disease.  I will also continue her Crestor at the current dose.  She is  to follow with her primary care physician, Dr. Artist Pais, on March 20, 2008.  I have encouraged her to keep this appointment with Dr. Artist Pais.  I  will see her back in my office in 1 year.  The patient is aware that she  should alert our office should she have any change in her symptoms over  the next 12 months.     Verne Carrow, MD  Electronically Signed    CM/MedQ  DD: 02/19/2008  DT: 02/20/2008  Job #: [id number]   cc:   Barbette Hair. Artist Pais, DO  Luis Abed, MD, [facility]

## 2010-11-07 NOTE — Assessment & Plan Note (Signed)
[facility]                         GASTROENTEROLOGY OFFICE NOTE   NAME:[name], [name]                     MRN:          [id number]  DATE:09/23/2007                            DOB:          [birth year]    PROBLEM:  1. Abdominal pain.  2. Constipation.   HISTORY:  [name] has returned for scheduled GI followup.  Constipation continues to be a problem.  This is despite taking  Lactulose 30 mL four times a day.  Amitiza was a failure.  She also  complains of left upper quadrant pain.  This is often postprandial.  She  has had some resting chest discomfort, described as a tightness with  mild tightness with exertion as well.  She has taken erythromycin and  Reglan in the past without improvement, either.   PHYSICAL EXAMINATION:  VITAL SIGNS:  Pulse 84, blood pressure 144/88,  weight 246.   IMPRESSION:  1. Constipation.  This is probably due to the motility disorder and      exacerbated by her occasional oxycodone use.  2. Abdominal pain.  This is nonspecific and is probably due to      nonulcer dyspepsia.  The same probably applies to her chest pain.   RECOMMENDATIONS:  1. Trial of Cytotec 200 mcg four times a day.  2. NuLev 0.25 mg sublingual p.r.n.     Barbette Hair. Arlyce Dice, MD,FACG  Electronically Signed    RDK/MedQ  DD: 09/23/2007  DT: 09/23/2007  Job #: [id number]

## 2010-11-07 NOTE — H&P (Signed)
NAMECORIN, [name]              ACCOUNT NO.:  [id number]   MEDICAL RECORD NO.:  [id number]          PATIENT TYPE:  INP   LOCATION:  0101                         FACILITY:  [facility]   PHYSICIAN:  Kela Millin, M.D.DATE OF BIRTH:  Feb 23, 1961   DATE OF ADMISSION:  02/21/2009  DATE OF DISCHARGE:                              HISTORY & PHYSICAL   PRIMARY CARE PHYSICIAN:  Barbette Hair. Artist Pais, DO.   CHIEF COMPLAINT:  Chest pain.   HISTORY OF PRESENT ILLNESS:  Patient is a 50 year old black female with  past medical history significant for hypertension and noncompliant with  her antihypertensives x1 year, hyperlipidemia, and history of chronic  cholecystitis/biliary dyskinesia, status post laparoscopic  cholecystectomy in May 2010, who presents with above complaints.  She  states that she began having chest pain about 4 days ago but thought it  was GI-related at that time and took Catering manager and Tums without much  relief, but subsequently the pain eased up but continued to occur  intermittently.  Today, the pain worsened, so she came to the ED.  She  describes the chest pain as left precordial in location, pressure-like,  8/10 in intensity at its worst, and associated with nausea and  paresthesias down her left upper extremity.  She also admits to  diaphoresis and shortness of breath.  She states that the pain was  nonexertional, and no relieving or aggravating factors noted.  She  states that the more intense pain had been going on for about 3 hours  prior to her coming to the ED.  She denies cough, fevers, PND,  orthopnea, leg swelling, diarrhea, melena, and no hematochezia.   She was seen in the ED, and point-of-care markers were done and were  negative, and a chest x-ray showed low lung volumes with no active  disease.  She was given aspirin and started on topical nitroglycerin and  was admitted for further evaluation and management.  Her EKG done in the  ED showed normal sinus  rhythm with findings consistent with LVH and no  acute ischemic changes.  Upon arrival in the ED, her blood pressure was  193/101 and after the nitro paste was applied, it improved to 160/87.  The patient states that she has not been taking her Benicar for at least  a year, stating that she did not want to be taking too many medications  and also that she was concerned about possible side effects.  She is  admitted for further evaluation and management.  It is also noted on the  chart review that the patient had a cardiac cath by Dr. Clifton James in  August, 2009 which revealed mild nonobstructive coronary artery disease,  and the patient was noted to have had uncontrolled hypertension during  that hospitalization as well.  The chest pain was attributed to that,  and she was placed on lisinopril and metoprolol.   PAST MEDICAL HISTORY:  1. As above.  2. History of iron-deficiency anemia.  3. History of large right hepatic lobe hemangioma.  4. History of esophageal stricture.  5. History of diabetes mellitus in the  past.  She states that her      medications were discontinued about a year ago because her blood      sugars were staying on the low side.  6. Multiple skin nodules.   MEDICATIONS:  1. Crestor 5 mg about 3 times a week.  2. Protonix 40 mg daily.  (States that she has been compliant with      this).   As already mentioned above, she is not taking any Benicar in at least a  year.   ALLERGIES:  NKDA.   SOCIAL HISTORY:  She quit tobacco 16 years ago after smoking for about  10 years.  Occasional alcohol.  She denies illicit drug use.   FAMILY HISTORY:  Her dad had an MI in his 38s and has hypertension.  Mother has hypertension.  Her grandmother is deceased, had diabetes and  heart failure.  Her uncle is deceased, had diabetes and heart failure as  well.   REVIEW OF SYSTEMS:  As per HPI, other review of systems negative.   PHYSICAL EXAMINATION:  GENERAL:  The patient is a  pleasant middle-aged  black female.  She is alert and appropriate in no apparent distress.  VITAL SIGNS: Her blood pressure is 160/87, initially 193/101.  Her pulse  is 66.  Her respiratory rate is 20, and temperature is 99.  O2 sat is  100%.  HEENT:  PERRL, EOMI, moist mucous membranes, and no oral exudates.  Sclerae anicteric.  NECK:  Supple.  No adenopathy, no thyromegaly and no JVD.  LUNGS:  Decreased breath sounds at bases, otherwise clear to  auscultation bilaterally.  CARDIOVASCULAR:  Regular rate and rhythm.  Normal S1-S2.  No S3  appreciated.  ABDOMEN:  Soft, bowel sounds present, nontender, nondistended.  No  organomegaly and no masses palpable.  Small well-healed scar is noted.  Bowel sounds present.  EXTREMITIES:  No cyanosis and no edema.  NEURO:  She is alert and oriented x3.  Cranial nerves II-XII grossly  intact.  Nonfocal exam.  SKIN:  She has small nodules on the dorsum of her hands bilaterally and  also on her palms and also on her lower legs and feet.   LABORATORY DATA:  As per HPI.  Also a sodium is 138 with a potassium of  3.2.  Chloride is 106, CO2 of 25.  Glucose is 97, BUN of 10, creatinine  is 0.83, calcium is 8.6.  Her white cell count is 9.2 with a hemoglobin  of 10.6, hematocrit of 33.1, platelet count of 190.   ASSESSMENT AND PLAN:  1. Chest pain:  As discussed above, in patient with uncontrolled      hypertension.  She has hypertensive urgency secondary to      noncompliance and a history of mild nonobstructive coronary artery      disease per cardiac cath in August 2009 per Dr. Clifton James in 2009,      as already mentioned above.  It is noted that she presented      similarly with chest pain and was found to have uncontrolled blood      pressures and Dr. Clifton James recommended that she be started on      lisinopril and Lopressor.  I will cycle cardiac enzymes.  Also      place on aspirin and nitroglycerin.  We will place on PPI to cover      possible GI  etiologies, follow up cardiac enzymes, and consider a  cardiac consultation pending enzymes as appropriate.  2. Uncontrolled hypertension/hypertensive urgency secondary to      noncompliance with medications, as above.  Will place the patient      on metoprolol and also resume Benicar at this time.  Monitor and      further manage accordingly.  3. Hypokalemia:  Replace potassium.  4. History of diabetes mellitus:  As noted, above medications stopped      in the past secondary to low blood sugars.  Her blood glucose today      is within normal limits, 98.  We will obtain a hemoglobin A1c.      Follow and further manage accordingly.  5. History of iron-deficiency anemia, status post workup in the past.      Per GI reported to be unremarkable.  6. A history of hyperlipidemia:  Continue Crestor.  7. History of chronic cholecystitis/biliary dyskinesia, status post      laparoscopic cholecystectomy.  8. History of large right hepatic hemangioma.  9. History of mild nonobstructive coronary artery disease:  As above      per cardiac cath, 2009.  Cardiac enzymes as above.  Resume ACE      inhibitor and beta blocker, also aspirin and nitroglycerin p.r.n.      as above.      Kela Millin, M.D.  Electronically Signed     ACV/MEDQ  D:  02/22/2009  T:  02/22/2009  Job:  [id number]   cc:   Barbette Hair. [place], DO  [address] [place], [place] [address]   Verne Carrow, MD  [address]  [place] [address]

## 2010-11-07 NOTE — Op Note (Signed)
[name], [name]              ACCOUNT NO.:  [id number]   MEDICAL RECORD NO.:  [id number]          PATIENT TYPE:  INP   LOCATION:  1301                         FACILITY:  [facility]   PHYSICIAN:  Ardeth Sportsman, MD     DATE OF BIRTH:  [birth year]   DATE OF PROCEDURE:  10/28/2008  DATE OF DISCHARGE:                               OPERATIVE REPORT   PRIMARY CARE PHYSICIAN:  Barbette Hair. Artist Pais, DO.   GASTROENTEROLOGIST:  Barbette Hair. Arlyce Dice, MD,FACG and Hedwig Morton. Juanda Chance, MD.   SURGEON:  Ardeth Sportsman, MD.   ASSISTANT:  RN first assist.   PREOPERATIVE DIAGNOSES:  1. Biliary dyskinesia with probable chronic cholecystitis.  2. Right posterior lobe hemangioma.   POSTOPERATIVE DIAGNOSES:  1. Biliary dyskinesia with probable chronic cholecystitis.  2. Right posterior lobe hemangioma.  3. Fatty steatohepatitis.   PROCEDURE PERFORMED:  Laparoscopic cholecystectomy with intraoperative  cholangiogram.   ANESTHESIA:  1. General anesthesia.  2. Local.  3. Field block around port sites.   SPECIMEN:  Gallbladder.   DRAINS:  None.   ESTIMATED BLOOD LOSS:  Less than 30 mL.   COMPLICATIONS:  None apparent.   INDICATIONS:  [name] is a pleasant 50 year old female with known  gastroesophageal reflux disease and stricture, constipation,  gastroparesis and other abdominal issues and a hemangioma of the right  hepatic lobe.  She is having worsening right upper quadrant abdominal  pain.  It got to the point where she could not keep any food down and  therefore she was admitted by [facility] 3 days ago.  Workup was concerning for a decreased gallbladder ejection fraction and  biliary dyskinesia.  My partner, Dr. Jaclynn Guarneri, saw the patient and  recommended surgical evaluation and asked if I could proceed with  surgery and after evaluation I agreed.   The anatomy and physiology of hepatobiliary and pancreatic function was  discussed.  Pathophysiology of gallbladder disease and with  its risks of  dyskinesia, nausea, vomiting, pain, cholecystitis and other risks were  discussed.  Other options were discussed.  Questions answered and the  patient agreed to proceed.   OPERATIVE FINDINGS:  She did have a fatty liver.  She had some moderate  adhesions to the gallbladder and narrowed cystic duct consistent with  biliary dyskinesia with chronic cholecystitis.  I was not able to see  the posterior hemangioma and did not pursue more aggressively.   PROCEDURE IN DETAIL:  Informed consent was obtained.  The patient  received IV cefazolin just prior to surgery.  She had sequential  pressure devices active during the entire case.  She voided just prior  to going to the operating room.  She underwent general anesthesia  without any difficulty.  She was positioned supine with both arms  tucked.  Her abdomen was prepped and draped in sterile fashion.   Using optical entry technique a #5 mm port was placed in the right upper  quadrant with the patient in steep reverse Trendelenburg and right side  up.  Camera inspection revealed no intra-abdominal injury.  Under direct  visualization 5 mm ports  were placed in the right flank as well as  through the umbilicus through a prior incision away from midline  adhesions.  A 10 mm port was placed in the epigastric region and  tunneled obliquely around the falciform ligament.   Gallbladder fundus was grasped and elevated cephalad.  Adhesions to the  liver and gallbladder of the greater omentum and mesocolon were freed  off carefully.  Duodenum was carefully swept off the infundibulum of the  gallbladder.  Gallbladder was rather intrahepatic and was very rightward  facing.  Therefore I ended up freeing off the gallbladder off its  attachments to the posterolateral peritoneum to the liver bed and helped  free the gallbladder in the left lateral to medial fashion.  I ended up  freeing probably about 2/3 of the gallbladder off.  Eventually with  this  better mobilization I was able to come over to the right side and score  the anteromedial peritoneum between the gallbladder and the liver.  With  careful dissection I was able to free the gallbladder off the liver bed  on this side.  A cystic artery could be seen going into the gallbladder.  This was isolated and  elevated and one clip on the gallbladder and two  clips slightly proximal were placed and this was transected.  Further  dissection revealed a somewhat contracted infundibulum and a short  cystic duct but after careful skeletonization I was able to get a  complete to good critical view.  During dissection there had been some  spillage of bile.   A nick was made in the distal infundibulum and a 5 [place]  cholangiocatheter was placed through the abdominal wall through a  puncture site and into the infundibulum without any difficulty.  A clip  was placed across it.   A cholangiogram was run using dilute radio-opaque contrast and  continuous fluoroscopy.  Contrast flowed through the distal infundibulum  and to a corkscrew shaped cystic duct.  The contrast flowed into the  common hepatic duct and up the right and left intrahepatic chains across  the common bile duct across the normal and into the duodenum.  The  biliary system seemed to be well decompressed and actually more on the  smaller side.  There was no evidence of any strictures or lucencies or  leaks.  The cholangiocatheter was removed.  Four clips were placed on  the cystic duct, proximal cystic duct and infundibulum and transection  was completed.   The gallbladder was freed from its main attachments on the liver bed and  placed in an EndoCatch bag and removed out the subxiphoid port.  No  extra dilation.  Copious irrigation was done, over 2 liters.  Hemostasis  was assured on the liver bed as well as in the other organs.  There was  no evidence of any injury elsewhere.  I could not see the hemangioma in  the  right posterior hepatic lobe and I did not aggressively pursue it.  She had some mild fatty steatohepatitis visually but no evidence of any  cirrhosis.  I did not feel too strongly about doing any core liver  biopsies especially with the known hemangioma.   Capnoperitoneum was evacuated and ports removed.  The subxiphoid fascial  defect was too small to allow my pinky to pass and it was tunneled at an  angle so I did not feel it required more aggressive closure since I did  not need to dilate the site and dilate  that to extract the gallbladder  and they were naturally dilating ports anyway.  The skin was closed  using 4-0 Monocryl stitch.  Sterile dressing was applied.   The patient was extubated and sent to the recovery room in stable  condition.  I discussed postop care with the patient in detail just  prior to surgery.  I will discuss it with her family as well.      Ardeth Sportsman, MD  Electronically Signed     SCG/MEDQ  D:  10/28/2008  T:  10/28/2008  Job:  [id number]   cc:   Hedwig Morton. Juanda Chance, MD  [address]  [place]  [place] [address]   Barbette Hair. Artist Pais, DO  [address] [place], [place] [address]   Barbette Hair. Arlyce Dice, MD,FACG  [address]  [place]  [place] [address]

## 2010-11-07 NOTE — Cardiovascular Report (Signed)
[name], [name]              ACCOUNT NO.:  [id number]   MEDICAL RECORD NO.:  [id number]          PATIENT TYPE:  OIB   LOCATION:  1965                         FACILITY:  [facility]   PHYSICIAN:  Verne Carrow, MDDATE OF BIRTH:  [birth year]   DATE OF PROCEDURE:  DATE OF DISCHARGE:  02/06/2008                            CARDIAC CATHETERIZATION   PROCEDURES:  1. Left heart catheterization.  2. Selective coronary angiography.  3. Left ventricular angiogram.   INDICATION:  Chest pain in a patient with risk factors of coronary  artery disease.   OPERATOR:  Verne Carrow, MD.   FINDINGS:  1. The left main coronary artery is normal and gives rise to the      circumflex and the LAD.  2. The left anterior descending is a large vessel that gives rise to a      very large first diagonal that is free of disease.  There are      luminal irregularities noted in the mid LAD with stenoses ranging      between 20% and 30%.  3. Circumflex coronary artery gives rise to a large obtuse marginal      branch that bifurcates and is free of disease.  There are mild      luminal irregularities in the mid circumflex.  4. The right coronary artery is a dominant vessel that gives rise to      posterior descending branch and a posterolateral branch.  There are      mild luminal irregularities noted in the mid right coronary artery      and the distal right coronary artery.  5. The left ventricle shows hyperdynamic systolic function with an      ejection fraction of 65%-70%.  Left ventricular pressure is 198/21      with an end-diastolic pressure of 27.  Aortic pressure during the      case is 193/101.   IMPRESSION:  1. Mild nonobstructive coronary artery disease.  2. Normal left ventricular function.  3. Uncontrolled hypertension.  4. Chest pain   RECOMMENDATIONS:  I think that many of the patient's symptoms are  probably related to her uncontrolled hypertension.  I will add  lisinopril  20 mg once daily today.  Because of her hypertension and also  her nonobstructive coronary artery disease, I am also going to add  Lopressor 25 mg twice daily and aspirin 81 mg once daily.  I will plan  on seeing her in my office in 3-4 weeks.      Verne Carrow, MD  Electronically Signed    CM/MEDQ  D:  02/06/2008  T:  02/06/2008  Job:  [id number]

## 2010-11-07 NOTE — H&P (Signed)
[name], DEJARNETT NO.:  [id number]   MEDICAL RECORD NO.:  [id number]          PATIENT TYPE:  OIB   LOCATION:  5128                         FACILITY:  [facility]   PHYSICIAN:  Hermelinda Medicus, M.D.   DATE OF BIRTH:  26-Aug-1960   DATE OF ADMISSION:  08/08/2007  DATE OF DISCHARGE:  08/09/2007                              HISTORY & PHYSICAL   HISTORY OF PRESENT ILLNESS:  This patient is a 50 year old female who  has had a lifelong history of right ear pain and otitis media and  mastoiditis.  He apparently over the last 5 years has had enough pain  and swelling behind her ears so she has been the ER at times and has  been treated with IV antibiotics as well as longer term p.o.  antibiotics.  She has not developed any allergies to antibiotics but  does have diabetes type 2, and she also does have blood pressure labile  hypertension problems.  She now enters for a right mastoid tympanoplasty  after showing a CAT scan showing considerable erosion and mastoiditis.  Her middle ear looked reasonably good and her audiogram shows a flat  tympanogram but not severe conductive hearing loss which would indicate  that most likely ossicular chain is still intact.  Her past history is  one of smoking, but she quit in 89.  She had a stress test which came  out to be quite excellent.  Her chest x-ray showed no acute disease.  Her EKG showed moderate voltage criteria, left ventricular hypertrophy.  Left ventricle on the Doppler, the aortic valve, all were found to be  normal.  On her stress tests, there was minimal pericardial effusion  without any hypodynamic compromise.  Her summary is overall left  ventricular systolic function was normal left ventricular ejection  fraction was estimated 55%.  There was no diagnostic evidence of left  ventricular regional wall motion abnormalities.  Features were  consistent with mild diastolic dysfunction.  The left atrium was mildly  dilated.  There  was minimal pericardial effusion without hemodynamic  compromise.  With this her medicines, she is type 2 diabetic, so she  does watch her diet and does take medicines faithfully for this.   ALLERGIES:  No allergies to medications.   MEDICATIONS:  1. Metoclopramide 10 mg q.i.d.  2. Azor 5-20 mg in the a.m.  3. Actos 30 mg in the a.m.  Blood sugar this morning is 141.   PHYSICAL EXAMINATION:  VITAL SIGNS:  Blood pressure of 127/80.  HEENT:  Her ear on the left is clear.  The right ear shows superior  tympanic membrane erythema with just crusting and a some drainage, but  very mild. The inferior tympanic membrane looks to be quite good  condition.  The mastoid is not tender or painful this time.  The nose is  clear.  The oral cavity is clear.  The oral cavity is free of any of  ulceration or mass.  The larynx is clear to cords, false cords,  epiglottis, base of tongue are clear to mobility, gag reflex, EOMs,  facial nerve  are all symmetrical.  CHEST:  Clear to rales, rhonchi or wheezes.  CARDIOVASCULAR:  No murmurs, rubs or gallops.  EXTREMITIES:  Unremarkable.  She is mildly obese.   DIAGNOSES:  1. Right mastoiditis with otitis media, lifelong history.  2. Type 2 diabetes.  3. Labile hypertension in the ranges from 125/70 to 180/100.  4. She also has some skin papules which are undiagnosed at this time.           ______________________________  Hermelinda Medicus, M.D.     JC/MEDQ  D:  08/08/2007  T:  08/10/2007  Job:  [id number]   cc:   Barbette Hair. Artist Pais, DO

## 2010-11-07 NOTE — Assessment & Plan Note (Signed)
[facility]                            CARDIOLOGY OFFICE NOTE   NAME:PEARCEShantara, Goosby                     MRN:          [id number]  DATE:01/29/2008                            DOB:          April 16, 1961    PRIMARY CARE DOCTOR:  Barbette Hair. Artist Pais, DO   REASON FOR VISIT:  Chest pain.   HISTORY OF PRESENT ILLNESS:  Ms. Colquhoun is a pleasant 50 year old  African American female with a past medical history significant for  hypertension, gastroesophageal reflux disease, gastroparesis, H. pylori,  peptic ulcer disease, microscopic anemia, diabetes mellitus type 2,  esophageal strictures status post dilation, and hyperlipidemia who  presents with complaints of left-sided chest discomfort.  The patient  states that over the last several months she has noted and almost daily  left-sided substernal chest pain that radiates into her left shoulder  and arm.  She states this occurs both with exertion and at rest.  There  is typically association of diaphoresis and shortness of breath with the  chest pain.  There are no palpitations, nausea, vomiting, or dizziness  associated with the chest pain.  She does note a certain level of  fatigue, which is not typical for her normal state of health.  She is  overweight and weighs 234 pounds today.  She notes occasional swelling  in her bilateral ankles.  Chest pain lasts anywhere from 30 seconds to  several minutes and goes away typically when she is at rest.  She has a  long history of gastroesophageal reflux disease with other  gastrointestinal problems to include constipation and epigastric pain.  She has no other complaints at this time.   PAST MEDICAL HISTORY:  1. Hypertension.  2. Hyperlipidemia.  3. Diabetes mellitus.  4. Gastroesophageal reflux disease.  5. Gastroparesis.  6. Peptic ulcer disease with history of H. pylori.  7. Anemia.  8. Esophageal stricture status post dilation.  9. Endometriosis.  10.Ovarian  cyst.  11.Chest pain in the past, workup which included stress test in      November 2008, which showed no evidence of ischemia.   PAST SURGICAL HISTORY:  1. Total hysterectomy in 1988.  2. Exploratory laparotomy with Bilateral Salpingo-Oophorectomy in      2003.  3. Left knee arthroplasty in 2005.   SOCIAL HISTORY:  The patient is married, with 2 grown children, has been  on disability since 2001.  She is a former smoker, but quit smoking in  1996.  She states that she smoked a pack a day for almost 10 years.  She  admits to occasional use of alcohol.  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  She uses caffeine daily.   FAMILY HISTORY:  Her mother and father as well as siblings are alive and  well.  There is no family history of coronary artery disease or sudden  cardiac death.   PHYSICAL EXAMINATION:  GENERAL:  She is a pleasant, obese, African  American female in no acute distress.  VITAL SIGNS:  Her weight today is 234 pounds.  Blood pressure 134/88,  pulse 79 and regular.  Respirations  12 and unlabored.  NECK:  No JVD.  No carotid bruits.  SKIN:  Warm and dry.  LUNGS:  Clear to auscultation bilaterally without wheezes, rhonchi, or  crackles noted.  CARDIOVASCULAR:  Regular rate and rhythm without murmurs, gallops, or  rubs noted.  ABDOMEN:  Obese and soft.  Bowel sounds present.  EXTREMITIES:  There is no evidence of lower extremity edema.  Pulses are  difficult to palpate secondary to the patient's size, but appeared to be  1+ in the lower extremities.   DIAGNOSTIC STUDIES:  A 12-lead electrocardiogram obtained in our office  today, shows normal sinus rhythm with a ventricular rate of 79 beats per  minute and complete right bundle-branch block and poor R-wave transition  through the pericardial leads.   ASSESSMENT AND PLAN:  This is a 50 year old African American female with  multiple risk factors for coronary artery disease including  hypertension, hyperlipidemia, diabetes  mellitus, obesity, and remote  history of tobacco use who presents with left-sided substernal chest  discomfort with radiation into her left shoulder and arms that is  associated with shortness of breath and diaphoresis.  The pain has been  occurring on a daily basis over the last several weeks.  While the  patient's pain is not typical for classical angina, she does have  multiple risk factors for CAD and pain that occurs with exertion. I have  reviewed her normal stress tests from 10 months ago and do not feel that  it would be necessary to repeat another stress test.  I have spoken to  the patient about performing a left heart catheterization to rule out  coronary disease.  The patient is agreeable with this plan.  We will  plan on performing a left heart catheterization with selective coronary  angiography on February 06, 2008, at [facility].  The patient  will come in next week for labs prior to her catheterization.     Verne Carrow, MD  Electronically Signed    CM/MedQ  DD: 01/29/2008  DT: 01/30/2008  Job #: [id number]   cc:   Pricilla Riffle, MD, [facility]  Barbette Hair. Artist Pais, DO

## 2010-11-07 NOTE — Assessment & Plan Note (Signed)
[facility]                         GASTROENTEROLOGY OFFICE NOTE   NAME:[name], [name]                     MRN:          [id number]  DATE:05/12/2007                            DOB:          03-10-1961    PROBLEM:  Abdominal and chest pain.   REASON:  [name] has returned for evaluation of above. She has  recently been complaining of left upper quadrant pain that mainly  radiated to her chest. It occurs at rest. It is unaffected by eating.  She has had minimal nausea and foul taste in her mouth. She denies  vomiting. She apparently underwent a cardiac workup that was negative.  CT of the chest and abdomen demonstrated a hiatal hernia and an old  hepatic hemangioma. She claims the pain keeps her awake. She has a  history of an esophageal stricture and was last dilated in March 2008.  The CT also demonstrated a large hiatal hernia with air seen to the mid  and distal esophagus.   MEDICATIONS:  Include:  1. Actos.  2. Crestor.  3. Simvastatin.  4. Nexium.  5. Carafate.  6. Zyrtec.  7. Oxycodone.   PHYSICAL EXAMINATION:  On exam, pulse 84, blood pressure 124/78, weight  228.  HEENT:  EOMI.  PERRLA.  Sclerae are anicteric.  Conjunctivae are pink.  NECK:  Supple without thyromegaly, adenopathy or carotid bruits.  CHEST:  Clear to auscultation and percussion without adventitious  sounds.  CARDIAC:  Regular rhythm; normal S1 S2.  There are no murmurs, gallops  or rubs.  ABDOMEN:  Bowel sounds are normoactive.  Abdomen is soft, nontender and  nondistended.  There are no abdominal masses, tenderness, splenic  enlargement or hepatomegaly.  EXTREMITIES:  Full range of motion.  No cyanosis, clubbing or edema.  RECTAL:  Deferred.   IMPRESSION:  Persistent left upper quadrant pain. Etiology is not  apparent. She could have gastroparesis on the basis of pain and very  minimal nausea and vomiting .  I do not think pain is relative to her   esophagus.   RECOMMENDATIONS:  1. Trial of Levbid 0.37 mg twice a day.  2. Gastric emptying scan.     [name]. Denise Dice, MD,FACG  Electronically Signed    RDK/MedQ  DD: 05/12/2007  DT: 05/13/2007  Job #: [id number]

## 2010-11-07 NOTE — Assessment & Plan Note (Signed)
[facility]                         GASTROENTEROLOGY OFFICE NOTE   NAME:[name], [name]                     MRN:          [id number]  DATE:06/30/2007                            DOB:          [birth year]    PROBLEM:  Regurgitation.   REASON:  [name] has returned.  She continues to complain of  immediate post-prandial regurgitation. She has regurgitation of gastric  contents. Symptoms are not improved with Reglan. She has well-  established gastroparesis. She complains of a foul taste in her mouth.  She has abdominal discomfort with regurgitation. She also complains of  constipation.   PHYSICAL EXAMINATION:  VITAL SIGNS:  Pulse 68, blood pressure 128/84,  weight 240.   IMPRESSION:  1. Gastroparesis.  2. Idiopathic constipation.   RECOMMENDATION:  1. Trial of erythromycin 500 mg twice a day. While she is on this, I      will hold her Lipitor.  2. Trial of amitiza 24 mcg twice a day.     Barbette Hair. Arlyce Dice, MD,FACG  Electronically Signed    RDK/MedQ  DD: 06/30/2007  DT: 06/30/2007  Job #: [id number]

## 2010-11-07 NOTE — Assessment & Plan Note (Signed)
[facility]                         GASTROENTEROLOGY OFFICE NOTE   NAME:Quinton, Denise ANSPACH                     MRN:          [id number]  DATE:08/14/2007                            DOB:          [birth year]    PROBLEM:  Nausea and constipation.   REASON:  [name] has returned for ongoing evaluation.  Amitiza was  not helpful for her constipation.  She continues to complain of nausea.  Gastric emptying scan demonstrated significant gastric retention of one  hour, but emptying was normal by two hours.  Erythromycin was not  helpful as well.   EXAMINATION:  Pulse 72, blood pressure 130/78, weight 238.   IMPRESSION:  1. Constipation.  This is functional constipation.  2. Nausea.  This may be in part related to #1.  She does not have      classic gastric emptying as defined by delayed emptying at two      hours, though she seems to have some motility problem.   RECOMMENDATIONS:  1. Gastroparesis diet.  2. Increase lactulose to 30 cc 4 times daily.     Barbette Hair. Arlyce Dice, MD,FACG  Electronically Signed    RDK/MedQ  DD: 08/14/2007  DT: 08/14/2007  Job #: [id number]

## 2010-11-08 ENCOUNTER — Telehealth: Payer: Self-pay | Admitting: Gastroenterology

## 2010-11-09 ENCOUNTER — Ambulatory Visit: Payer: BC Managed Care – PPO | Admitting: Rehabilitation

## 2010-11-09 NOTE — Telephone Encounter (Signed)
Pt complains of abdominal pain and would like to be seen, she states it is above her belly button. Pt scheduled to see Amy Esterwood PA 11/15/10@2pm .

## 2010-11-10 NOTE — Op Note (Signed)
   NAME:  [name], [name]                        ACCOUNT NO.:  [id number]   MEDICAL RECORD NO.:  [id number]                   PATIENT TYPE:  AMB   LOCATION:  ENDO                                 FACILITY:  [facility]   PHYSICIAN:  Barbette Hair. Arlyce Dice, M.D. [facility]          DATE OF BIRTH:  1961-02-11   DATE OF PROCEDURE:  04/06/2003  DATE OF DISCHARGE:  04/06/2003                                 OPERATIVE REPORT   PROCEDURE PERFORMED:  Esophageal manometry.   INDICATIONS FOR PROCEDURE:  The patient has dysphagia.  This is despite  esophageal dilatation therapy.   Esophageal manometry was performed by way of the normal pull-through  technique.   FINDINGS:  1 - Upper esophageal sphincter pressure, contractions and  relaxation were normal.  2 - There were normal peristaltic contractions throughout the esophageal  body.  __________ esophageal contractions were 100%.  1. Lower esophageal sphincter pressure measured 45 mm.  % relaxation was     90%.   IMPRESSION:  Normal esophageal manometry.                                               Barbette Hair. Arlyce Dice, M.D. [facility]    RDK/MEDQ  D:  04/12/2003  T:  04/12/2003  Job:  [id number]

## 2010-11-10 NOTE — Op Note (Signed)
[facility]  Patient:    [name], [name] Visit Number: [id number] MRN: [id number]          Service Type: GYN Location: 910A 9107 01 Attending Physician:  Leonard Schwartz Dictated by:   Janine Limbo, M.D. Admit Date:  12/05/2001 Discharge Date: 12/07/2001                             Operative Report  NO DICTATION. Dictated by:   Janine Limbo, M.D. Attending Physician:  Leonard Schwartz DD:  12/07/01 TD:  12/09/01 Job: [id number]

## 2010-11-10 NOTE — Discharge Summary (Signed)
NAME:  [name], [name] NO.:  [id number]   MEDICAL RECORD NO.:  [id number]                   PATIENT TYPE:  INP   LOCATION:  9107                                 FACILITY:  [facility]   PHYSICIAN:  Janine Limbo, M.D.            DATE OF BIRTH:  1961/05/14   DATE OF ADMISSION:  12/05/2001  DATE OF DISCHARGE:  12/07/2001                                 DISCHARGE SUMMARY   DISCHARGE DIAGNOSES:  Pelvic pain, pelvic adhesions, ovarian cyst, and  endometriosis.   OPERATION:  On the day of admission, the patient underwent exploratory  laparotomy with lysis of adhesions, ureterolysis, bilateral salpingo-  oophorectomy, and cystoscopy, tolerating all procedures well.  The patient  was status post hysterectomy and was found to have at this time extensive  pelvic adhesions which matted both tubes and ovaries bilaterally.  The  patient's ureters were found to be patent bilaterally.   HISTORY OF PRESENT ILLNESS:  The patient is a 50 year old female para 2-0-0-  2 who presents for exploratory laparotomy with lysis of adhesions, bilateral  salpingo-oophorectomy, and cystoscopy because of pelvic pain and ovarian  cysts.  Please see the patient's dictated History and Physical Examination  for details.   PHYSICAL EXAMINATION:  VITAL SIGNS:  Weight 218 pounds.  GENERAL:  Within normal limits.  PELVIC:  EG/BUS was within normal limits.  The vagina is normal.  Cervix is  absent, uterus is absent.  Adnexa are tender particularly on the right, but  no masses are appreciated.  Rectovaginal exam confirms.   HOSPITAL COURSE:  On the date of admission the patient underwent the  aforementioned procedures, tolerating them all well.  The patient's  postoperative hemoglobin was 9.2 (preoperative hemoglobin 11.2).  Her  postoperative course was unremarkable with the patient quickly resuming  bowel and bladder function by postoperative day #2 and deemed ready for  discharge  home.   DISCHARGE MEDICATIONS:  1. Vicodin one to two tablets q.4-6h. as needed for pain.  2. Ibuprofen 600 mg one tablet with food q.6h. for five days and then as     needed for pain.  3. Phenergan 25 mg one tablet q.6h. as needed for nausea.  4. Stool softeners 100 mg one tablet b.i.d. until bowel movements are     regular.  5. Estrace 1 mg one tablet q.d.   FOLLOW-UP:  The patient is scheduled for staple removal at [facility] on December 08, 2001 at 2:30 p.m.  She has a six weeks  postoperative exam with Dr. Stefano Gaul on January 16, 2002 at 10:30 a.m.   DISCHARGE INSTRUCTIONS:  The patient was given a copy of  [facility] postoperative instruction sheet.  She was further  advised to avoid driving for two weeks, heavy lifting for four weeks, and  intercourse for six weeks.   DIET:  Without restriction.   FINAL PATHOLOGY:  1. Left ovary and fallopian tube:  Ovarian partially cystic hemorrhage     corpus luteum, tuboovarian adhesion,     complete transection of fallopian tube, no atypia or evidence of     malignancy.  2. Right ovary and fallopian tube:  Serosal adhesion, ovarian benign serous     cyst, complete transection of fallopian tube, no atypia or evidence of     malignancy.     Elmira J. Adline Peals.                    Janine Limbo, M.D.    EJP/MEDQ  D:  02/17/2002  T:  02/18/2002  Job:  [id number]

## 2010-11-10 NOTE — H&P (Signed)
[facility]  Patient:    [name], [name] Visit Number: [id number] MRN: [id number]          Service Type: GYN Location: [facility] Attending Physician:  Leonard Schwartz Dictated by:   Janine Limbo, M.D. Admit Date:  11/14/2001 Discharge Date: 11/14/2001   CC:         [facility]   History and Physical  HISTORY OF PRESENT ILLNESS:   [name] is a 50 year old female, para 2-0-0-2, who presents for exploratory laparotomy, lysis of adhesions, bilateral salpingo-oophorectomy, and cystoscopy.  The patient has a history of fibroids and in 1989, she underwent a hysterectomy by another physician.  The patient was found to have pelvic adhesions at the time of laparoscopy in 2002 by anther physician.  Because of dense adhesions, the patient was told that the surgery could not be performed through the laparoscope.  The patient has experienced increasing pain to the point that she is having difficulties performing her normal functions.  She has been seen in the emergency department on occasions, and she has also been given pain medications without significant improvement.  The patient had an ultrasound performed in May 2003, which showed an ovarian cyst that measured 2.5 x 1.5 cm.  The patient wants to proceed with oophorectomy at this time.  OBSTETRICAL HISTORY:          The patient has had two term vaginal deliveries.  DRUG ALLERGIES:               None known.  PAST MEDICAL HISTORY:         The patient has a history of hypertension and migraine headaches.  She currently takes hydrochlorothiazide and ______.  REVIEW OF SYSTEMS:            The patient does have a history of blood with her bowel movements.  She has headaches, as mentioned above.  FAMILY HISTORY:               The patients father has bowel cancer, diabetes, and kidney failure.  The patients father also has a history of anemia.  PHYSICAL EXAMINATION:  VITAL SIGNS:                   Weight is 218 lbs.  HEENT:                        Within normal limits.  CHEST:                        Clear.  HEART:                        Regular rate and rhythm.  BREASTS:                      Without masses.  ABDOMEN:                      Nontender.  EXTREMITIES:                  Within normal limits.  NEUROLOGIC:                   Grossly normal.  PELVIC:                       External genitalia is normal.  The vagina is  normal.  The cervix is absent.  The uterus is absent.  Adnexa are tender, particularly on the right, but no masses are appreciated.  Rectovaginal exam confirms.  Hemoccult is negative.  ASSESSMENT:                   1. Pelvic pain.                               2. History of pelvic adhesions, diagnosed by                                  laparoscope.                               3. Ovarian cyst.  PLAN:                         After carefully considering her options, [name] has decided to proceed with exploratory laparotomy with bilateral salpingo-oophorectomy and lysis of adhesions.  She understands the indications for her procedure, and she accepts the risk of, but not limited to, anesthetic complications, bleeding, infection, and possible damage to the surrounding organs.  She understands that no guarantees can be given concerning the total relief of her discomfort.  Dictated by:   Janine Limbo, M.D. Attending Physician:  Leonard Schwartz DD:  12/04/01 TD:  12/04/01 Job: [id number]

## 2010-11-10 NOTE — Assessment & Plan Note (Signed)
[facility]                         GASTROENTEROLOGY OFFICE NOTE   NAME:[name], Denise LEGERE                     MRN:          [id number]  DATE:08/19/2006                            DOB:          19-Apr-1961    PROBLEM LIST:  1. Dysphagia.  2. Constipation.   [name] has returned for reevaluation. She has recurrent dysphagia to  solids. She has a history of an esophageal stricture which was last  dilated in April 2007. Constipation remains a problem. She cannot move  her bowels without the help of a cathartic. Even with stimulants, she  has to strain and will pass small amounts of blood per rectum. She  underwent colonoscopy 2 years ago which was unremarkable. She has  frequent regurgitation of gastric contents and pyrosis. She has known  gastroparesis but did not improve with Reglan.   MEDICATIONS:  __________ and Lipitor.   PHYSICAL EXAMINATION:  Pulse 84, blood pressure 124/78, weight 228. On  abdominal exam, she has a succussion splash. There are no abdominal  masses, tenderness or organomegaly.   IMPRESSION:  1. Recurrent esophageal stricture.  2. Gastroparesis.  3. Irritable bowel syndrome/constipation.   RECOMMENDATIONS:  1. Begin Prevacid 30 mg a day.  2. Trial of erythromycin 500 mg twice a day.  3. Trial of Amitiza 24 mcg twice a day.  4. Upper endoscopy with balloon dilatation - under anesthesia with      anesthesia administering sedation.     Barbette Hair. Arlyce Dice, MD,FACG  Electronically Signed    RDK/MedQ  DD: 08/19/2006  DT: 08/19/2006  Job #: [id number]

## 2010-11-10 NOTE — Assessment & Plan Note (Signed)
[facility]                         GASTROENTEROLOGY OFFICE NOTE   NAME:Woodfield, JAHAIRA EARNHART                     MRN:          [id number]  DATE:08/19/2006                            DOB:          May 14, 1961    ADDENDUM:  I have decided to hold her erythromycin since this has a  potential interaction with Lipitor.     Barbette Hair. Arlyce Dice, MD,FACG  Electronically Signed    RDK/MedQ  DD: 08/19/2006  DT: 08/19/2006  Job #: [id number]

## 2010-11-10 NOTE — Op Note (Signed)
[facility]  Patient:    [name], [name] Visit Number: [id number] MRN: [id number]          Service Type: GYN Location: 910A [id number] [id number] Attending Physician:  Leonard Schwartz Dictated by:   Janine Limbo, M.D. Proc. Date: 12/07/01 Admit Date:  12/05/2001 Discharge Date: 12/07/2001   CC:         [facility]   Operative Report  INCOMPLETE  PREOPERATIVE DIAGNOSES: 1. Pelvic pain. 2. History of pelvic adhesions. 3. Status post abdominal hysterectomy. 4. Ovarian cyst.  POSTOPERATIVE DIAGNOSES: 1. Pelvic pain. 2. History of pelvic adhesions. 3. Status post abdominal hysterectomy. 4. Ovarian cyst. 5. Endometriosis. 6. Retroperitoneal fibrosis.  OPERATION: 1. Exploratory laparoscopy. 2. Bilateral salpingo-oophorectomy. 3. Lysis of adhesions. 4. Ureterolysis. 5. Cystoscopy.  SURGEON:  Janine Limbo, M.D.  FIRST ASSISTANT:  Henreitta Leber, P.A.-C.  ANESTHESIA:  General.  DISPOSITION: Ms. Ditommaso is a 50 year old, female who presents with chronic and constant pelvic pain.  Her pain has progressed to the point where she is having difficulty working and performing her normal daily functions.  Pain medications have not relived her discomfort.  The patient is status post hysterectomy.  She understands the indications for her surgical procedure, and she accepts the risks of, but not limited to, anesthetic complications, bleeding, infections, and possible damage to surrounding organs.  The patient underwent laparoscopy two years ago by another physician, and he video taped his procedure.  The patient was told that, because of the extensive peritoneal adhesions, she would not be able to have her procedure performed through the laparoscope.  After reviewing the video tape, I agree that exploratory laparotomy is the most appropriate procedure.  The patient understands that no guarantees can be given concerning the total relief of  her symptoms.  FINDINGS:  There were extensive adhesions between the colon, the left adnexa, and the left pelvic sidewall.  There were extensive adhesions between the large bowel, the right tube and ovary, and the right pelvic sidewall.  There were extensive adhesions between the large bowel and the posterior cul-de-sac. There were extensive adhesions between the omentum, bladder, and the bowel. There was a simple cyst present on the right ovary.  The left ovary appeared to be bilobed, and the left ovary was densely adhered to the posterior cul-de-sac.  There were hyperpigmented lesions in the posterior cul-de-sac involving both ovaries, and this seemed consistent with endometriosis.  DESCRIPTION OF PROCEDURE:  The patient was taken to the operating room where general anesthetic was given.  The patients abdomen, perineum, and vagina were prepped with multiple layers of Betadine.  Examination under anesthesia was performed.  The patient had a Foley catheter then placed in the bladder. She was sterilely draped.   A large transverse incision was made in the abdomen through the patients previous incision, and the incision was extended through the subcutaneous tissue, the fascia, and the anterior peritoneum.  The patient was found to have extensive adhesions as mentioned above.  We began our procedure by removing the adhesions between the omentum, the bladder, and the bowel.  We then sharply and slowly removed the adhesions between the bowel and the pelvic sidewalls bilaterally.  This portion of the procedure took approximately 45 minutes.  We then began to restore normal anatomy.  Care was taken as we sharply and meticulously removed the bowel from the adnexa and the posterior cul-de-sac.  Care was taken not to damage the bowel.  This portion of the procedure took  approximately 45 minutes.  We then tried to identify the ureters through the broad ligament.  We were unable to positively do  so.  We opened the left round ligament, and we skeletonized the left infundibulopelvic ligament.  The left perirectal space was then explored, and care was taken not to damage any of the structures. Dictated by:   Janine Limbo, M.D. Attending Physician:  Leonard Schwartz DD:  12/07/01 TD:  12/09/01 Job: [id number]

## 2010-11-10 NOTE — H&P (Signed)
[name], [name]              ACCOUNT NO.:  [id number]   MEDICAL RECORD NO.:  [id number]          PATIENT TYPE:  INP   LOCATION:  0103                         FACILITY:  [facility]   PHYSICIAN:  Theone Stanley, MD   DATE OF BIRTH:  1961/05/05   DATE OF ADMISSION:  07/08/2005  DATE OF DISCHARGE:                                HISTORY & PHYSICAL   CHIEF COMPLAINT:  Chest pain and vertigo.   HISTORY OF PRESENT ILLNESS:  [name] is a very pleasant 50 year old  African-American female who states last week she started to have chest pain.  Her pain is substernal and kind of off to the left side of the sternum.  The  pain comes and goes.  It lasts about 5-10 minutes.  It can occur at any  time, whether she is resting or ambulating.  It is sharp and radiates  somewhat to the back.  After being sharp, it kind of progresses to a crampy  pain.  The patient states at first she thought it was secondary to her GI  issues.  Apparently, she has gastroparesis, severe GERD, and needs  intermittent dilation of her esophagus by Dr. Arlyce Dice.  In addition to the  chest pain, she states that she has dizziness.  She describes vertigo again  off and on.  However, this is not associated with the chest pain.  It can  occur at a different time.  When she has the chest pain, she is slightly  short of breath and nauseated with it.  She has no diaphoresis, however.   PAST MEDICAL HISTORY:  Significant for hypertension, GERD, gastroparesis,  hypoxemia, and migraines.   MEDICATIONS:  1.  Crestor 10 mg.  2.  Hydrochlorothiazide/triamterene 25/37.5.  3.  Reglan 10 mg q.i.d.  The patient states that this was recently started.   ALLERGIES:  No known drug allergies.   SOCIAL HISTORY:  The patient lives in [place], is married, has two  children.  No tobacco, alcohol, or illicit drug use.  She used to work in  the [employer].  She is currently disabled.   FAMILY HISTORY:  Significant for father who had  a small MI in his 52s and a  brother who had an MI in his 15s.  Mother has hypertension.   PAST SURGICAL HISTORY:  Total abdominal hysterectomy in 1988 and several  scopes on her knees.   REVIEW OF SYSTEMS:  Please see HPI.  Otherwise, all systems were negative.   PHYSICAL EXAMINATION:  Vital signs:  99.2, blood pressure 154/86.  HEENT:  Head atraumatic, normocephalic.  Eyes:  3 mm.  Pupils are equal, round, and  reactive to light.  Extraocular movements are intact.  Ears:  No discharge.  Throat clear with no erythema and no exudate.  Mucosa moist.  Neck is supple  with no lymphadenopathy and no JVD.  Heart:  Regular rate and rhythm with no  murmurs, rubs, or gallops appreciated.  Lungs are clear to auscultation  bilaterally.  Abdomen is soft and nontender and nondistended.  Extremities:  No cyanosis, clubbing, or edema.  Neurologic:  The patient is alert and  oriented x3.  GU deferred.   LABORATORY DATA:  A chest x-ray shows cardiac enlargement which is stable,  vascular crowding, atelectasis.  Urine pregnancy was negative.  UA was  negative.  First set of cardiac markers were less than 0.05.  Sodium 136,  potassium 3.3, chloride at 102.  Carbon dioxide at 27, glucose at 92, BUN at  6, creatinine at 0.9.  Calcium at 8.8, total protein at 7, albumin at 3.3.  AST at 21, ALT at 24, alkaline phosphatase at 148.  Total bilirubin at 0.7.  White count of 9, hemoglobin of 10, hematocrit 31, MCV of 56, platelets at  288.   Repeat cardiac enzymes were negative.  EKG showed bradycardia.  Otherwise,  no acute findings.   ASSESSMENT/PLAN:  1.  Typical chest pain.  The patient has multiple risk factors including      hypertension, hyperlipidemia, and significant family history.  Although      her enzymes are negative and EKG shows no signs, we will admit her for      observation.  Repeat cardiac enzymes.  Obtain an echocardiogram and      consult cardiology.  Suspect she will need risk  stratification      minimally.  Continue aspirin 325, Nitropace, morphine, and Crestor to      try to get her pressures under control.  Continue the      hydrochlorothiazide/triamterene.  I suspect she has an element of LVH.      Therefore, we will start her on lisinopril and a small amount of      metoprolol.  2. GI.  Gastroparesis/GERD.  We will continue with      Protonix.  I will discontinue the Reglan since this might be causing      some of her symptoms.  Dizziness is part of side effects including      laryngospasm.  We will put her on Zelnorm and Protonix.  3.      Hyperlipidemia.  Continue with the Crestor.  I will check a fasting      lipid.  4. Microcytic anemia.  The patient did not mention this.  She      has a hysterectomy.  Therefore, it is unclear what is the cause.  She is      African-American and need to consider sickle cell trait or G6PD      deficiency.  Will obtain iron studies, reticulocyte count, and guaiac      her stools.  5. Vertigo.  It is unclear of the etiology.  However,      Reglan might be the cause.  We will stop Reglan and start her on      meclizine and continue to observe her.  6. Prophylaxis.  The patient is      started on some Lovenox and proton pump inhibitor.      Theone Stanley, MD  Electronically Signed     AEJ/MEDQ  D:  07/09/2005  T:  07/09/2005  Job:  [id number]   cc:   Teena Irani. Arlyce Dice, M.D.  Fax: [phone]

## 2010-11-10 NOTE — Discharge Summary (Signed)
[name], [name]              ACCOUNT NO.:  [id number]   MEDICAL RECORD NO.:  [id number]          PATIENT TYPE:  INP   LOCATION:  1610                         FACILITY:  [facility]   PHYSICIAN:  Theone Stanley, MD   DATE OF BIRTH:  04-28-1961   DATE OF ADMISSION:  07/09/2005  DATE OF DISCHARGE:  07/15/2005                                 DISCHARGE SUMMARY   ADMITTING DIAGNOSES:  1.  Chest pain.  2.  Vertigo.  3.  Migraines.  4.  Gastroparesis.  5.  Gastroesophageal reflux disease.  6.  Hypertension.  7.  Hyperlipidemia.   DISCHARGE DIAGNOSES:  1.  Chest pain secondary to GI etiology, most likely gastroparesis and      gastroesophageal reflux disease.  2.  Vertigo, resolved.  3.  Migraines.  4.  Gastroparesis.  5.  Gastroesophageal reflux disease.  6.  Hypertension.  7.  Hyperlipidemia.   CONSULTATIONS:  Barbette Hair. Arlyce Dice, M.D., Gastroenterology; [facility].   PROCEDURES/DIAGNOSTIC TESTS:  The patient had a CT angio of the abdomen  which and CT angio of the chest which showed no CT evidence for pulmonary  emboli, dependent atelectasis but no evidence of significant pulmonary  findings, moderate-sized hiatal hernia, stable minimal anterior soft tissue  fullness which is probably residual thymic tissue, normal CT appearance of  abdominal aorta and major branch vessels, mild diffuse fatty infiltrate of  the liver, there is a 4.7 cm right hepatic lobe liver lesion which is  consistent with hemangioma and it was present on prior study from 2005, no  other significant abdominal findings.  The patient had a nuclear medicine  Cardiolite study performed, impression:  No evidence of myocardial ischemia  or infarct, perhaps mild hypokinesis in the distal septum, QGS ejection  fraction of 47%.  The patient did have an echocardiogram, however results  are currently not present.  The cardiologist reviewed it and stated that she  had a normal ejection fraction.   HOSPITAL  COURSE:  [name] is a very pleasant 50 year old female who on  presentation stated that she started to have chest pain last week.  The pain  is substernal and somewhat off to the left side of the sternum.  The pain  comes and goes.  It lasts a maximum of 5-10 minutes.  It can occur at any  point in time but it is sharp and radiates to her back.  After the original  sharp pain, it progresses to a crampy pain.  At first, the patient thought  it was secondary to GI issues.  However, because of its persistent nature,  she decided to come to the ER.  In addition, she describes what appears to  be vertigo again which is on and off and not associated with her chest pain.  She had no associated shortness of breath, nausea, vomiting, or diaphoresis  associated with her chest pain.  However, because of her history of  hypercholesterolemia and strong family history, it was felt that it would be  best to admit the patient for further evaluation.  The patient had an  extensive workup that includes a CT angio of the abdomen, CT angio of the  chest, Cardiolite, and echocardiogram.  All of these did not indicate any  source.  Dr. Melvia Heaps was also consulted.  No repeat EGD was performed  since she recently had one in the beginning of January.  She also had a  gastric emptying study which showed abnormal gastric emptying, consistent  with gastroparesis.  In the end, it was felt that this is secondary to a  combination of her gastroparesis and GERD.  Her H. pylori was positive from  the EGD.  She was started on Prev-Pak here in the hospital.  The patient  left in stable condition.   DISCHARGE MEDICATIONS:  1.  Crestor 10 mg.  2.  Hydrochlorothiazide/triamterene 25/37.5.  3.  Reglan 10 mg four times a day.  4.  Aspirin 81 mg, one by mouth daily.  5.  Meclizine 25 mg three times a day p.r.n.  6.  Prev-Pak, one dose twice a day for an additional ten days.  7.  Protonix 40 mg, one by mouth daily.   8.  Metoprolol 12.5 mg, one by mouth twice a day.  9.  Oxycodone 5 mg, 1-2 by mouth every 4 hours p.r.n.   FOLLOW UP:  The patient was instructed to follow up with Dr. Melvia Heaps.  She already had an appointment for July 23, 2005.  She is to keep this  appointment and follow up with her primary care physician in 2-3 weeks.      Theone Stanley, MD  Electronically Signed     AEJ/MEDQ  D:  07/15/2005  T:  07/16/2005  Job:  [id number]   cc:   Barbette Hair. Arlyce Dice, M.D. [facility]  [address]  [place]  [place] [address]

## 2010-11-10 NOTE — Consult Note (Signed)
NAME:  [name], [name] NO.:  [id number]   MEDICAL RECORD NO.:  [id number]                   PATIENT TYPE:  EMS   LOCATION:  ED                                   FACILITY:  [facility]   PHYSICIAN:  Iva Boop, M.D. [facility]           DATE OF BIRTH:  1960-11-27   DATE OF CONSULTATION:  11/20/2002  DATE OF DISCHARGE:                                   CONSULTATION   CHIEF COMPLAINT:  Painful swallowing after esophageal dilation.   HISTORY OF PRESENT ILLNESS:  This is a 50 year old African-American female  that underwent esophageal dilation for dysphagia and esophageal stricture  earlier today by Dr. Melvia Heaps.  I have reviewed that report.  She  apparently was not cooperative.  She was dilated to 15 mm with minimal  resistance.  There were some possible early varices present.  She has not  had any vomiting.  She is complaining of painful swallowing in the left neck  area and in the chest.  It is hard to swallow anything she said when she  called me at about 6:50 this evening.  I had her come to the ER where a  barium swallow was organized and undertaken, and demonstrated some spasm,  but no perforation.   PHYSICAL EXAMINATION:  CHEST:  Chest wall is nontender.  NECK:  She is mildly tender in the left neck area.  There is full range of  motion of the neck.  HEENT:  Inspection of the pharynx and mouth shows some barium coating on the  tongue, but is otherwise unremarkable.  ABDOMEN:  Soft, bowel sounds present, no mass, nontender.  VITAL SIGNS:  Blood pressure 158/89, temperature 99.1, pulse 63,  respirations 18.   ASSESSMENT:  Odynophagia after esophageal dilation.  Question related to  spasm, possible laceration of the esophagus, but there is no perforation,  and the patient does not look to be toxic or in any acute distress.  Note,  that on respiratory examination anteriorly there is no Hammon's crunch, no  wheezing, air sounds are clear.   PLAN:   I will release her from the emergency department.  I have explained  to her the need to take her Levbid.  I will give her a limited supply of  Tylenol No. 3 elixir, as well as some viscus Xylocaine to use.  She is to  stay on clear liquids tonight and liquids tomorrow.  She understands to call  if she has further problems.                                               Iva Boop, M.D. [facility]    CEG/MEDQ  D:  11/20/2002  T:  11/21/2002  Job:  [id number]   cc:   Barbette Hair. Arlyce Dice,  M.D. [facility]

## 2010-11-10 NOTE — Op Note (Signed)
NAMEMAIRELY, FOXWORTH              ACCOUNT NO.:  [id number]   MEDICAL RECORD NO.:  [id number]          PATIENT TYPE:  AMB   LOCATION:  DSC                          FACILITY:  [facility]   PHYSICIAN:  Feliberto Gottron. Turner Daniels, M.D.   DATE OF BIRTH:  1960/12/12   DATE OF PROCEDURE:  06/21/2004  DATE OF DISCHARGE:                                 OPERATIVE REPORT   PREOPERATIVE DIAGNOSIS:  Left knee chondromalacia, possible loose bodies.   POSTOPERATIVE DIAGNOSIS:  Left knee chondromalacia, trochlea, grade 3 and  medial femoral condyle focal grade 3.   PROCEDURE:  Left knee arthroscopic removal of chondromalacia of the trochlea  and medial femoral condyle.   SURGEON:  Feliberto Gottron. Turner Daniels, M.D.   FIRST ASSISTANT:  None.   ANESTHESIA:  General endotracheal anesthesia.   ESTIMATED BLOOD LOSS:  Minimal.   FLUIDS REPLACED:  800 mL of crystalloid.   DRAINS:  None.   INDICATIONS FOR PROCEDURE:  The patient is a 50 year old woman with a prior  open surgical procedure to her left knee.  She is not certain what was done,  but she has catching, popping and pain in her knee. An MRI scan shows  possible loose bodies and chondromalacia and she is taken for arthroscopic  evaluation and treatment of her left knee.  The risks and benefits of  surgery are understood by the patient.   DESCRIPTION OF PROCEDURE:  The patient was identified by the arm band, taken  to the operating room in [facility] and appropriate anesthetic  monitors were attached and general endotracheal anesthesia was induced with  the patient in the supine position. A lateral post was applied to the table.  The left lower extremity was prepped and draped in the usual sterile fashion  from the ankle to the mid thigh.  Using a #11 blade, standard inferomedial  and inferolateral parapatellar portals were then made allowing introduction  of the arthroscope through the inferolateral portal and the probe through  the inferomedial portal.   Diagnostic arthroscopy revealed the patella to be  in good condition.  A focal grade 3 chondromalacia of the trochlea was  debrided with a 3.5 Gator sucker shaver as was the focal chondromalacia in  the medial femoral condyle.  The medial meniscus and the rest of the medial  articular cartilage was in good condition as was the medial gutter.  The  anterior cruciate ligament and the posterior cruciate ligament were intact  on the lateral side.  The articular and meniscal cartilages were pristine.  The gutters were cleared.  The scope was taken medial and lateral to the PCL  clearing the posterior compartment.  At this point,  the knee was washed out with normal saline solution irrigation.  The  arthroscopic instruments were removed and a dressing of Xeroform, 4 x 4  dressings, sponges, Webril and an ace wrap applied.  The patient was then  awakened and taken to the recovery room without difficulty.      Emilio Aspen  D:  06/21/2004  T:  06/21/2004  Job:  [id number]

## 2010-11-10 NOTE — Op Note (Signed)
[facility]  Patient:    LILIE, VEZINA Visit Number: [id number] MRN: [id number]          Service Type: GYN Location: 910A 9107 [id number] Attending Physician:  Leonard Schwartz Dictated by:   Janine Limbo, M.D. Proc. Date: 12/05/01 Admit Date:  12/05/2001 Discharge Date: 12/07/2001   CC:         [facility]   Operative Report  PREOPERATIVE DIAGNOSES:       1. Pelvic pain.                               2. Ovarian cyst.                               3. Pelvic adhesions.                               4. Status post hysterectomy.  POSTOPERATIVE DIAGNOSES:      1. Pelvic pain.                               2. Ovarian cyst.                               3. Pelvic adhesions.                               4. Status post hysterectomy.                               5. Endometriosis.                               6. Retroperitoneal fibrosis.  PROCEDURES:                   1. Exploratory laparotomy.                               2. Bilateral salpingo-oophorectomy.                               3. Lysis of adhesions.                               4. Ureterolysis.                               5. Cystoscopy.  SURGEON:                      Janine Limbo, M.D.  FIRST ASSISTANT:              Henreitta Leber, P.A.  ANESTHESIA:                   General.  DISPOSITION:  The patient is a 50 year old female who presents with pelvic pain.  Her discomfort has progressed to the point that she is having difficulty performing her normal daily functions, and she is having difficulty working.  Pain medications have not relieved her discomfort.  The patient is status post hysterectomy.  She had a diagnostic laparoscopy in 2001.  The surgeon at the time videotaped her procedure.  He felt that her adhesions were too extensive to perform her surgery through the laparoscope. He recommended exploratory laparotomy.  I reviewed the videotape,  and I agreed that laparoscopy would not be the most appropriate route for caring for this patient.  The patient understands the indications for her surgical procedure, and she accepts the risk of, but not limited to, anesthetic complications, bleeding, infection, and possible damage to the surrounding organs.  She understands that no guarantees can be given concerning the total relief of her discomfort.  She further understands that hormone replacement therapy will likely be beneficial.  We discussed the risks and benefits associated with hormone replacement therapy.  In addition, we discussed the WomenS Health Initiative Study.  The patient understood all of the above and is ready to  proceed.  FINDINGS:                     The uterus and the cervix were surgically absent.  The patient was found to have dense adhesions between the omentum, the bladder and the bowel.  She was found to have dense adhesions between the colon and the left fallopian tube, the left ovary, and the left pelvic side all.  She had dense adhesions between the colon and the right fallopian tube, right ovary, and the right pelvic sidewall.  The large bowel was adhered to the posterior cul-de-sac.  The right ovary contained a 3 cm cyst.  There were hyperpigmented lesions and the posterior cul-de-sac that were thought to be consistent with endometriosis.  There was thickening in the retroperitoneal spaces, from the patients prior surgery.  Following our procedure, cystoscopy was performed and both ureters had blue dye coming through them, which was consistent with patency.  There were no lesions or damage to the inner surface of the bladder.  DESCRIPTION OF PROCEDURE:     The patient was taken to the operating room, where a general anesthetic was given.  The patients abdomen, perineum and vagina were prepped in multiple layers of Betadine.  Examination under anesthesia was performed.  A Foley catheter was placed in  the bladder.  The patient was sterilely draped.  A large low transverse incision was made in the abdomen, and the incision was extended through the subcutaneous tissue, the fascia, and the anterior peritoneum.  The pelvis was explored, with findings as mentioned above.  We began our procedure by removing the adhesions between the omentum, the left pelvic sidewall, and the bladder anteriorly.  We then began to restore normal anatomy.  Enterolysis was performed.  We carefully removed the adhesions between the large bowel, the left fallopian tube, and the left ovary.  Care was taken not to damage the bowels.  We then focused our attention to removing the adhesions between the bowel and the right pelvic side wall.  Again, care was taken not to damage the bowel.  This portion of our operative procedure took approximately 1-1/2 hr.  We then tried to identify the ureter on the left pelvic side wall.  This was unsuccessful.  We, therefore, identified the left round ligament,  and we opened the left retroperitoneal space as well as the left perirectal space.  The underlying structures were identified.  We identified the left ureter and followed it through its course in the pelvis.  Then, using a combination of sharp dissection and electrocautery, we removed the left ovary and the left fallopian tube from the left broad ligament.  Care was taken not to damage the left ureter, and we kept the ureter visualized.  The left infundibulopelvic ligament was then skeletonized, clamped, cut, free tied, and suture ligated. Suture material used was 0 Vicryl.  We turned our attention then to the right fallopian tube and the right ovary. Again, we were unable to identify the right ureter on the right pelvic side wall.  We, therefore, opened the right round ligament and we developed the right retroperitoneal space and the right perirectal space.  We were then able to identify the right ureter and follow the ureter  on its course through the  pelvis.  Again, using the electrocautery and sharp dissection, we were able to remove the right ureter from the right broad ligament.  The right infundibulopelvic ligament was skeletonized.  The right infundibulopelvic ligament was then clamped, cut, free-tied, and suture ligated.  Again, 0 Vicryl was the suture material used.  We achieved hemostasis in the pelvis using 3-0 Vicryl.  At this point we noticed a remnant of tissue on the left pelvic side wall.  We again identified the ureter and then using a combination of sharp dissection and electrocautery, we were able to free the portion of ovary from the left pelvic side wall.  Again, care was taken not to damage the left ureter.  This portion of our operative procedure took approximately one hour.  The pelvis was then vigorously irrigated.  Hemostasis was at that point thought to be adequate.  The bowel was carefully inspected once again, and there was no evidence of damage to the surfaces of the bowel.  The bladder was carefully inspected and there was no evidence of damage to the bladder. Therefore, all instruments and packs were removed.  The fascia was closed using a running suture of 0 Vicryl, followed by interrupted sutures of 0 Vicryl.  Hemostasis was noted to be adequate.  The subcutaneous layer was irrigated. Hemostasis was achieved using the electrocautery.  The subcutaneous layer was closed using interrupted sutures of 3-0 Vicryl.  The skin was reapproximated using skin staples.  Sponge, needle and instrument counts were correct on two occasions.  ESTIMATED BLOOD LOSS:         400 cc.  DISPOSITION:                  At this point the patient was noted to drain clear yellow urine.  The patient was given an ampule of indigo carmine. Cystoscopy was performed using the 70-degree scope and dye was quickly seen passing from the ureteral orifices.  The fundus of the bladder was carefully inspected, and  there was no evidence of damage.  We therefore removed all instruments and reinserted the Foley catheter.  The patient was returned to the supine position.  The patient was awakened from her anesthetic and taken to the recovery room in stable condition.  TOTAL OPERATIVE TIME:         Approximately 2 hr 55 min. Dictated by:   Janine Limbo, M.D. Attending Physician:  Leonard Schwartz DD:  12/08/01 TD:  12/09/01 Job: [id number]

## 2010-11-10 NOTE — Op Note (Signed)
[facility]  Patient:    [name]                      MRN: [id number] Proc. Date: 07/13/99 Adm. Date:  [id number] Attending:  Frederich Balding                           Operative Report  PREOPERATIVE DIAGNOSIS:       Pelvic pain.  POSTOPERATIVE DIAGNOSIS:      Extensive pelvic adhesions.  OPERATIVE PROCEDURE:          Open laparoscopy with lysis of adhesions.  SURGEON:                      Juluis Mire, M.D.  ANESTHESIA:                   General endotracheal anesthesia.  ESTIMATED BLOOD LOSS:         Minimal.  PACKS AND DRAINS:             None.  INTRAOPERATIVE BLOOD REPLACEMENT:  None.  COMPLICATIONS:                None.  INDICATIONS:                  Indications are dictated in history and physical.  DESCRIPTION OF PROCEDURE:     Patient was taken to the OR and placed in supine position.  After satisfactory level of general endotracheal anesthesia was obtained, patient was placed in the dorsal lithotomy position using the Allen stirrups.  The abdomen, perineum and vagina were prepped out with Betadine. Bladder was emptied by in-and-out catheterization.  Exam under anesthesia was unremarkable.  A sponge on a sponge stick was placed in the vaginal vault.  The  patient was draped out for laparoscopy.  A subumbilical incision was made with  knife and carried through subcutaneous tissue.  Fascia was identified and entered sharply.  Rectus muscles were separated in the midline.  The peritoneum was entered.  There was no evidence of any periumbilical adhesions or injury to adjacent organs.  Two lateral sutures of 0 Vicryl were put in place and held. Hasson cannula was put in place and secured.  Abdomen was insufflated with CO2. A 5-mm trocar was put in place in the suprapubic area under direct visualization.  She had omental adhesions on the left side, extending from the mid-lower abdomen to the pelvic area; these were taken  down using cautery and scissors.  The pelvic cavity was completely encased in the omental and bowel adhesions.  The right ovary was densely adherent to the right pelvic sidewall.  Left ovary could not be visualized despite attempt at lysis of adhesions.  Because of the extensive adhesions, safe lysis could not be undertaken.  We terminated the procedure at his point in time.  There was no evidence of injury to adjacent organs or active bleeding.  Abdomen was deflated of its CO2 and all trocars removed.  ______ fascia was closed with figure-of-eight of 0 Vicryl; skin was closed with a running subcuticular 4-0 Vicryl.  Suprapubic incision was closed with Steri-Strips. Sponge on a sponge stick was removed from the vaginal vault.  Patient was taken out of the dorsal lithotomy position and once extubated, transferred to the recovery room n good condition.  Sponge, instrument and needle count was reported as correct by  the circulating nurse. DD:  07/13/99 TD:  07/14/99 Job: [id number]

## 2010-11-10 NOTE — Assessment & Plan Note (Signed)
[facility]                             PRIMARY CARE OFFICE NOTE   NAME:PEARCEMikhia, Dusek                     MRN:          [id number]  DATE:02/19/2006                            DOB:          08-27-1960    CHIEF COMPLAINT:  New patient to practice.   HISTORY OF PRESENT ILLNESS:  The patient is a 50 year old African-American  female here to establish primary care.  The patient is followed by Dr.  Melvia Heaps of [facility].  She has a past medical history of  gastroesophageal reflux disease with hiatal hernia, positive for H. pylori  in the past, treated, and also has a history of gastroparesis.  She has had  several hospitalizations in the last 6 months to a year for atypical chest  pain.  The patient underwent a stress test in January of 2007.  The patient  was noted to have normal ejection fraction and no evidence of myocardial  infarction on last Myoview.  She is followed by [facility].  The patient also has a history of microcytic anemia and was evaluated with  colonoscopy and EGD.  In addition, the patient underwent hemoglobin  electrophoresis in the past.  The patient does not have any sickle trait or   disease or any evidence of thalassemia.   She chronically takes Reglan for gastroparesis.  She is on Caduet for blood  pressure and dyslipidemia.   Review of her labs show that she has had abnormal blood sugars in the past,  and the last hemoglobin A1C that was checked earlier in 2007 was elevated at  6.2.  In addition, the patient has had a CT scan of her chest to rule out  PE, which noted fatty infiltration in the past.   The patient has a strong family history of type 2 diabetes.   She states that over the past year she has gained at least 30 pounds.   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  She has had knee surgery in 2001.  The last note from 2005 by Dr.  Turner Daniels notes left  knee chondromalacia.  She is status post left knee  arthroscopic removal of chondromalacia of the trochlea and medial femoral  condyle.   PAST MEDICAL HISTORY SUMMARY:  1. Hypertension.  2. Gastroesophageal reflux disease with hiatal hernia.  3. Gastroparesis.  4. Dyslipidemia.  5. History of H. pylori.  6. Microscopic anemia.  7. Left knee chondromalacia.  8. Status post total hysterectomy in 1988.   CURRENT MEDICATIONS:  1. Reglan 10 mg one p.o. a.c. and h.s.  2. Caduet 10/20 one a day.  3. Zetia 10 mg once a day.  4. Meclizine 25 mg t.i.d. p.r.n.   ALLERGIES TO MEDICATIONS:  None known.   SOCIAL HISTORY:  The patient is married.  Has 2 grown children.  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   FAMILY HISTORY:  Mother is age 41 with hypertension.  Father is 14 and also  has hypertension, diabetes, noted to have prostate cancer.  She  has a  grandmother who is diabetic, and also a sister who is diabetic.   HABITS:  She occasionally drinks alcohol.  Quit tobacco in 1996.  She was a  pack a day smoker for approximately 10 years.   REVIEW OF SYSTEMS:  No fevers or chills.  No HEENT symptoms.  The patient  still occasionally has chest discomfort.  No associated shortness of breath  or cough.  The patient states heartburn symptoms are not controlled.  Denies  any constipation or diarrhea.  No dysuria, frequency or urgency.  No  polyuria or polydipsia.  All other systems negative.  Please note that she  has a history of using NSAIDs in the past.   PHYSICAL EXAMINATION:  VITAL SIGNS:  Height is 5 feet, 4 inches.  Weight is  230 pounds.  Temperature is 97.5, pulse 59 and blood pressure is 130/70 in  the left arm in the seated position.  GENERAL:  The patient is a pleasant, obese 50 year old African-American  female in no apparent distress.  HEENT:  Normocephalic and atraumatic.  Pupils equal, round and reactive to  light bilaterally.  Extraocular mobility was intact.  The patient  was  anicteric.  Conjunctivae was within normal limits.  __________ membranes  were clear bilaterally.  The patient had a partial upper dental plate;  otherwise, oropharyngeal exam was unremarkable.  Some crowding of the upper  airway.  NECK:  Supple.  No adenopathy, carotid bruit or thyromegaly.  CHEST:  Normal respiratory effort.  Chest was clear to auscultation  bilaterally.  No rhonchi, rales or wheezing.  CARDIOVASCULAR:  Regular rate and rhythm.  No significant murmurs, rubs or  gallops appreciated.  ABDOMEN:  Protuberant, nontender.  Positive bowel sounds.  No organomegaly.  MUSCULOSKELETAL:  No clubbing, cyanosis, or edema.  The patient had intact  pedis dorsalis pulses.  SKIN:  The patient had multiple papular skin lesions on hands and feet,  noted to be chronic.   IMPRESSION/RECOMMENDATIONS:  1. Hypertension, uncontrolled.  2. Obesity with probable type 2 diabetes.  3. Dyslipidemia.  4. History of gastroparesis.  5. History of gastroesophageal reflux disease with hiatal hernia.  6. Microcytic anemia.  7. Health maintenance.   RECOMMENDATIONS:  The patient will be sent for repeat labs including  hemoglobin A1C and lipid panel.  She is to monitor her blood pressure at  home, and on followup visit bring at least 10 outpatient readings.  The  patient will likely need additional antihypertensives.   I am strongly suspicious that the patient has underlying type II diabetes,  and, on followup visit, will likely get started on metformin if there are no  contraindications.  May also help with a history of fatty liver.   We discussed general measures for weight loss with goal weight loss of  approximately 10% of her body weight within the next 6 months to 1 year.  Followup time is in approximately 2-4 weeks.                                   Barbette Hair. Artist Pais, DO   RDY/MedQ  DD:  02/19/2006  DT:  02/20/2006 Job #:  [id number]

## 2010-11-11 NOTE — Assessment & Plan Note (Signed)
>>  ASSESSMENT AND PLAN FOR LOW BACK PAIN WRITTEN ON 11/11/2010  9:48 AM BY YOO, DOE-HYUN R, DO  Pt may have discogenic pain.  Obtain MRI LS spine.  Use prednisone  taper as directed.

## 2010-11-11 NOTE — Assessment & Plan Note (Signed)
Pt may have discogenic pain.  Obtain MRI LS spine.  Use prednisone taper as directed.

## 2010-11-13 ENCOUNTER — Ambulatory Visit: Payer: BC Managed Care – PPO | Admitting: Rehabilitation

## 2010-11-15 ENCOUNTER — Ambulatory Visit: Payer: BC Managed Care – PPO | Admitting: Rehabilitation

## 2010-11-15 ENCOUNTER — Other Ambulatory Visit (INDEPENDENT_AMBULATORY_CARE_PROVIDER_SITE_OTHER): Payer: BC Managed Care – PPO

## 2010-11-15 ENCOUNTER — Encounter: Payer: Self-pay | Admitting: Physician Assistant

## 2010-11-15 ENCOUNTER — Ambulatory Visit (INDEPENDENT_AMBULATORY_CARE_PROVIDER_SITE_OTHER): Payer: BC Managed Care – PPO | Admitting: Physician Assistant

## 2010-11-15 VITALS — BP 144/86 | HR 68 | Temp 99.4°F | Ht 64.0 in | Wt 228.0 lb

## 2010-11-15 DIAGNOSIS — R1012 Left upper quadrant pain: Secondary | ICD-10-CM

## 2010-11-15 DIAGNOSIS — K5900 Constipation, unspecified: Secondary | ICD-10-CM

## 2010-11-15 DIAGNOSIS — K3184 Gastroparesis: Secondary | ICD-10-CM

## 2010-11-15 DIAGNOSIS — R110 Nausea: Secondary | ICD-10-CM

## 2010-11-15 LAB — URINALYSIS
Bilirubin Urine: NEGATIVE
Hgb urine dipstick: NEGATIVE
Ketones, ur: NEGATIVE
Leukocytes, UA: NEGATIVE
Nitrite: NEGATIVE
Specific Gravity, Urine: 1.025 (ref 1.000–1.030)
Total Protein, Urine: NEGATIVE
Urine Glucose: NEGATIVE
Urobilinogen, UA: 0.2 (ref 0.0–1.0)
pH: 5.5 (ref 5.0–8.0)

## 2010-11-15 LAB — CBC WITH DIFFERENTIAL/PLATELET
Basophils Absolute: 0 10*3/uL (ref 0.0–0.1)
Basophils Relative: 0.4 % (ref 0.0–3.0)
Eosinophils Absolute: 0.1 10*3/uL (ref 0.0–0.7)
Eosinophils Relative: 2.4 % (ref 0.0–5.0)
HCT: 35 % — ABNORMAL LOW (ref 36.0–46.0)
Hemoglobin: 11.3 g/dL — ABNORMAL LOW (ref 12.0–15.0)
Lymphocytes Relative: 42.9 % (ref 12.0–46.0)
Lymphs Abs: 2.6 10*3/uL (ref 0.7–4.0)
MCHC: 32.3 g/dL (ref 30.0–36.0)
MCV: 72.6 fl — ABNORMAL LOW (ref 78.0–100.0)
Monocytes Absolute: 0.6 10*3/uL (ref 0.1–1.0)
Monocytes Relative: 9.2 % (ref 3.0–12.0)
Neutro Abs: 2.8 10*3/uL (ref 1.4–7.7)
Neutrophils Relative %: 45.1 % (ref 43.0–77.0)
Platelets: 215 10*3/uL (ref 150.0–400.0)
RBC: 4.82 Mil/uL (ref 3.87–5.11)
RDW: 14.5 % (ref 11.5–14.6)
WBC: 6.2 10*3/uL (ref 4.5–10.5)

## 2010-11-15 LAB — BASIC METABOLIC PANEL
BUN: 11 mg/dL (ref 6–23)
CO2: 30 mEq/L (ref 19–32)
Calcium: 9.1 mg/dL (ref 8.4–10.5)
Chloride: 104 mEq/L (ref 96–112)
Creatinine, Ser: 0.8 mg/dL (ref 0.4–1.2)
GFR: 91.51 mL/min (ref >60.00)
Glucose, Bld: 98 mg/dL (ref 70–99)
Potassium: 3.9 mEq/L (ref 3.5–5.1)
Sodium: 140 mEq/L (ref 135–145)

## 2010-11-15 MED ORDER — TRAMADOL HCL 50 MG PO TABS: ORAL_TABLET | ORAL | Status: DC

## 2010-11-15 NOTE — Progress Notes (Signed)
Subjective:    Patient ID: [name], female    DOB: 1960/08/06, 50 y.o.   MRN: [id number]  HPI [name] is a pleasant 50 year old after American female known to Dr. Arlyce Dice who has undergone no previous workup of 4 history of iron deficiency anemia. She had a colonoscopy done in 2010 with 2 small polyps removed past consistent with adenomas upper endoscopy in November of 2010 showed mild gastritis questionable patchy inflammation of the small bowel capsule endoscopy was negative with the exception of a few tiny red spots. Patient is status postcholecystectomy and carries a diagnosis of idiopathic gastroparesis. She also has chronic constipation.  Patient comes in today as an acute abdominal with complaints of sharp left-sided abdominal pain present over the past 3 days. She says that this pain has been constant nonradiating and has made it difficult to sleep. She's been nauseated and has had a couple of episodes of vomiting. She says she feels as if her food is not digesting however this is more of a chronic problem. She's not been aware of any fever or chills, she has not had any changes in her bowel habits from her usual constipation and no melena or hematochezia. She does feel that her abdominal pain is somewhat worse postprandially. She's not had any recent injuries and has no urologic symptoms. There is no positional component to her pain. She denies any aspirin or NSAID use. She says her pain is at least a "8/10".    Review of Systems  Constitutional: Negative.   HENT: Negative.   Eyes: Negative.   Respiratory: Negative.   Cardiovascular: Negative.   Gastrointestinal: Positive for nausea and abdominal pain.  Genitourinary: Negative.   Musculoskeletal: Positive for back pain.  Skin: Negative.   Neurological: Negative.   Hematological: Negative.   Psychiatric/Behavioral: Negative.    Outpatient Prescriptions Prior to Visit  Medication Sig Dispense Refill  . amitriptyline (ELAVIL)  25 MG tablet Take 1 tablet (25 mg total) by mouth at bedtime.  30 tablet  3  . furosemide (LASIX) 20 MG tablet Take 1 tablet (20 mg total) by mouth daily.  30 tablet  3  . gabapentin (NEURONTIN) 100 MG capsule 1-3 caps 3 x daily as needed (as directed)  90 capsule  2  . potassium chloride SA (K-DUR,KLOR-CON) 20 MEQ tablet Take 20 mEq by mouth daily.        . valsartan-hydrochlorothiazide (DIOVAN-HCT) 160-12.5 MG per tablet Take 1 tablet by mouth daily.        Marland Kitchen esomeprazole (NEXIUM) 40 MG capsule Take 40 mg by mouth. 1 capsule each day 30 minutes before meal       . ondansetron (ZOFRAN) 4 MG tablet Take 4 mg by mouth 2 (two) times daily as needed. For nausea            Objective:   Physical Exam Well-developed African American female in no acute distress, alert and oriented x3, pleasant HEENT nontraumatic normocephalic EOMI PERRLA sclera anicteric  Neck; Supple no JVD Cardiovascular; regular rate and rhythm with S1-S2 no murmur rub or gallop  Pulmonary ; clear bilaterally  Abdomen; large soft no succussion splash bowel sounds present, she is tender in the left upper quadrant and left mid quadrant to deep palpation there is no guarding or rebound no palpable mass or hepatosplenomegaly Rectal; not done  Skin; changes consistent with neurofibromatosis n Psych; mood and affect normal and appropriate        Assessment & Plan:  #58 50 year old female  with 3-4 day history of constant left mid abdominal pain nausea and low-grade temp. Etiology of her pain is not clear.  Plan Check CBC Bmet , and UA Start Ultram 50 mg 1 every 4-6 hours as needed for pain #25 no refills Schedule for CT scan of the abdomen and pelvis  #2 History of adenomatous colon polyps Up-to-date on colon screening last colonoscopy July 2010  #3 Status post cholecystectomy  #4 Chronic gastroparesis Continue dietary management with small frequent meals  #5 Chronic constipation Increase MiraLax to 2 doses of MiraLax daily over  the next few days until evacuating bowels well then back to once daily dosing

## 2010-11-15 NOTE — Patient Instructions (Addendum)
We have scheduled a CT w/ contrast at [facility] CT [address]. Contrast and directions provided. Please go to the basement level to have your labs drawn.  We also sent a prescription for Ultram for pain to the pharmacy, [address].

## 2010-11-15 NOTE — Progress Notes (Signed)
I agree with the plan outlined in this note 

## 2010-11-16 ENCOUNTER — Ambulatory Visit (INDEPENDENT_AMBULATORY_CARE_PROVIDER_SITE_OTHER)
Admission: RE | Admit: 2010-11-16 | Discharge: 2010-11-16 | Disposition: A | Discharge status: Home / Self Care | Payer: BC Managed Care – PPO | Source: Ambulatory Visit | Attending: Physician Assistant | Admitting: Physician Assistant

## 2010-11-16 ENCOUNTER — Ambulatory Visit: Payer: BC Managed Care – PPO | Admitting: Nurse Practitioner

## 2010-11-16 ENCOUNTER — Telehealth: Payer: Self-pay | Admitting: *Deleted

## 2010-11-16 DIAGNOSIS — R1012 Left upper quadrant pain: Secondary | ICD-10-CM

## 2010-11-16 DIAGNOSIS — R110 Nausea: Secondary | ICD-10-CM

## 2010-11-16 MED ORDER — IOHEXOL 300 MG/ML  SOLN
100.0000 mL | Freq: Once | INTRAMUSCULAR | Status: AC | PRN
  Administered 2010-11-16: 100 mL via INTRAVENOUS

## 2010-11-16 NOTE — Telephone Encounter (Signed)
Patient notified of lab and CT results as per Mike Gip, PA. Patient scheduled with Dr. Arlyce Dice on 12/18/10 at 3:15 PM. Patient will take Ultram and see if it helps

## 2010-11-16 NOTE — Telephone Encounter (Signed)
Message copied by Jesse Fall on Thu Nov 16, 2010  2:50 PM ------      Message from: [place], [place]      Created: Thu Nov 16, 2010  2:38 PM       Please call [name], and let her know the ct scan is fine-nothing to explain her left sided pain. She has a hemangioma in her liver which is stable . Please make her a foolow up appt with her primary gi md. Ask her to take the ultram and see if that helps.

## 2010-11-21 ENCOUNTER — Ambulatory Visit: Payer: BC Managed Care – PPO | Admitting: Rehabilitation

## 2010-11-23 ENCOUNTER — Ambulatory Visit: Payer: BC Managed Care – PPO | Admitting: Rehabilitation

## 2010-11-23 ENCOUNTER — Ambulatory Visit: Payer: BC Managed Care – PPO | Admitting: Gastroenterology

## 2010-11-27 ENCOUNTER — Ambulatory Visit: Payer: BC Managed Care – PPO | Attending: Internal Medicine | Admitting: Rehabilitation

## 2010-11-27 DIAGNOSIS — IMO0001 Reserved for inherently not codable concepts without codable children: Secondary | ICD-10-CM | POA: Insufficient documentation

## 2010-11-27 DIAGNOSIS — M2569 Stiffness of other specified joint, not elsewhere classified: Secondary | ICD-10-CM | POA: Insufficient documentation

## 2010-11-27 DIAGNOSIS — M545 Low back pain, unspecified: Secondary | ICD-10-CM | POA: Insufficient documentation

## 2010-11-29 ENCOUNTER — Ambulatory Visit: Payer: BC Managed Care – PPO | Admitting: Rehabilitation

## 2010-12-01 ENCOUNTER — Ambulatory Visit: Payer: BC Managed Care – PPO | Admitting: Internal Medicine

## 2010-12-01 ENCOUNTER — Encounter: Payer: Self-pay | Admitting: Family

## 2010-12-01 ENCOUNTER — Encounter: Payer: Self-pay | Admitting: Internal Medicine

## 2010-12-01 ENCOUNTER — Ambulatory Visit (INDEPENDENT_AMBULATORY_CARE_PROVIDER_SITE_OTHER): Payer: BC Managed Care – PPO | Admitting: Family

## 2010-12-01 DIAGNOSIS — L0202 Furuncle of face: Secondary | ICD-10-CM

## 2010-12-01 DIAGNOSIS — L0292 Furuncle, unspecified: Secondary | ICD-10-CM

## 2010-12-01 MED ORDER — CEPHALEXIN 500 MG PO CAPS: 500.0000 mg | ORAL_CAPSULE | Freq: Four times a day (QID) | ORAL | Status: DC

## 2010-12-01 NOTE — Assessment & Plan Note (Signed)
Pt instructed to continue warm compresses, trial of keflex prior to any I and D.  Pt to call if symptoms worsen, otherwise follow up in 1 week.

## 2010-12-01 NOTE — Patient Instructions (Signed)
Continue warm compresses, call if symptoms worsen. Please follow up in 1 week.

## 2010-12-01 NOTE — Progress Notes (Signed)
Subjective:    Patient ID: [name], female    DOB: Feb 25, 1961, 50 y.o.   MRN: [id number]  HPI  [name] is a 50 year old female who presents today with chief complaint of "boil" on her left ear for 1 week.  Using warm compresses without any drainage. Note enlarging.  Denies associated pain or fever.      Review of Systems  See HPI  Past Medical History  Diagnosis Date  . Atypical chest pain   . Hyperlipidemia   . Hypertension   . GERD (gastroesophageal reflux disease)   . Diabetes mellitus, type II   . Constipation   . Routine general medical examination at a health care facility   . Gastroparesis   . Iron deficiency anemia   . Esophageal stricture   . Migraine headache   . Endometriosis   . Cyst, ovarian   . Peptic ulcer disease   . Non-compliance     History   Social History  . Marital Status: Married    Spouse Name: N/A    Number of Children: N/A  . Years of Education: N/A   Occupational History  . Not on file.   Social History Main Topics  . Smoking status: Former Games developer  . Smokeless tobacco: Not on file   Comment: Quit in 1996  . Alcohol Use: Not on file  . Drug Use: Not on file  . Sexually Active: Not on file   Other Topics Concern  . Not on file   Social History Narrative   Married Has 2 grown children.  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  She was a pack a day smoker for approximately 10 years.Alcohol use-yes: Social Daily Caffeine Use:6 pack of pepsi daily  Illicit Drug Use - no Patient does not get regular exercise.    Smoking Status:  quit    Past Surgical History  Procedure Date  . Abdominal exploration surgery     w/bso   . Knee surgery 2005     left knee  . Total abdominal hysterectomy   . Cholecystectomy   . Cardiac catheterization 2009    mild non obstructive CAD    Family History  Problem Relation Age of Onset  . Hypertension Mother   . Hypertension Father   . Diabetes Father    . Prostate cancer Father   . Diabetes Sister   . Diabetes      grandmother  . Other      no FH of colon cancer,  . Kidney disease Father     Allergies  Allergen Reactions  . Ace Inhibitors     REACTION: chronic cough    Current Outpatient Prescriptions on File Prior to Visit  Medication Sig Dispense Refill  . amitriptyline (ELAVIL) 25 MG tablet Take 1 tablet (25 mg total) by mouth at bedtime.  30 tablet  3  . esomeprazole (NEXIUM) 40 MG capsule Take 40 mg by mouth. 1 capsule each day 30 minutes before meal       . furosemide (LASIX) 20 MG tablet Take 1 tablet (20 mg total) by mouth daily.  30 tablet  3  . gabapentin (NEURONTIN) 100 MG capsule 1-3 caps 3 x daily as needed (as directed)  90 capsule  2  . magnesium citrate 1.745 GM/30ML SOLN Take 296 mLs by mouth once. As needed       . ondansetron (ZOFRAN) 4 MG tablet Take 4 mg by mouth 2 (two)  times daily as needed. For nausea       . polyethylene glycol powder (MIRALAX) powder Take 17 g by mouth daily. As needed       . Sodium Phosphates (FLEET ENEMA RE) Place rectally. As needed       . traMADol (ULTRAM) 50 MG tablet Take 1 tab every 4-6 hours as needed for pain.  25 tablet  0  . valsartan-hydrochlorothiazide (DIOVAN-HCT) 160-12.5 MG per tablet Take 1 tablet by mouth daily.        . potassium chloride SA (K-DUR,KLOR-CON) 20 MEQ tablet Take 20 mEq by mouth daily.          BP 138/84  Pulse 72  Temp(Src) 98.7 F (37.1 C) (Oral)  Resp 16  Ht 5\' 4"  (1.626 m)  Wt 227 lb 0.6 oz (102.985 kg)  BMI 38.97 kg/m2       Objective:   Physical Exam  Gen: awake, alert, NAd Skin: multiple hyperpigmentedflat  raised skin lesions noted on hands/legs.  Pea sized draining lesion between breasts.  Pea sized area of fluctuance noted in left ear lobe       Assessment & Plan:

## 2010-12-05 ENCOUNTER — Ambulatory Visit: Payer: BC Managed Care – PPO | Admitting: Rehabilitation

## 2010-12-06 ENCOUNTER — Encounter: Payer: Self-pay | Admitting: Internal Medicine

## 2010-12-08 ENCOUNTER — Ambulatory Visit: Payer: BC Managed Care – PPO | Admitting: Rehabilitation

## 2010-12-08 ENCOUNTER — Encounter: Payer: Self-pay | Admitting: Family

## 2010-12-08 ENCOUNTER — Ambulatory Visit (INDEPENDENT_AMBULATORY_CARE_PROVIDER_SITE_OTHER): Payer: BC Managed Care – PPO | Admitting: Family

## 2010-12-08 VITALS — BP 144/99 | HR 71 | Temp 98.3°F | Resp 18 | Wt 230.0 lb

## 2010-12-08 DIAGNOSIS — L0293 Carbuncle, unspecified: Secondary | ICD-10-CM

## 2010-12-08 DIAGNOSIS — L0292 Furuncle, unspecified: Secondary | ICD-10-CM

## 2010-12-08 MED ORDER — DOXYCYCLINE HYCLATE 100 MG PO CAPS: 100.0000 mg | ORAL_CAPSULE | Freq: Two times a day (BID) | ORAL | Status: AC

## 2010-12-08 NOTE — Patient Instructions (Signed)
Apply warm compresses twice daily to affected areas. Start doxycycline, stop keflex. Call if symptoms worsen, do not improve or if you develop fever over 101.  Follow up in 1 week.

## 2010-12-08 NOTE — Assessment & Plan Note (Addendum)
Discussed risks of I and D including scarring.  Pt verbalizes understanding and agrees to procedure.  See discussion above. Will switch her Keflex 2 doxycycline.

## 2010-12-08 NOTE — Progress Notes (Signed)
Subjective:    Patient ID: [name], female    DOB: 07-Jan-1961, 50 y.o.   MRN: [id number]  HPI  [name] is a 50 year old female who presents today for follow up of the cellulitis/abscess on her left ear and on her chest. She is taking Keflex and notes no improvement- though no worsening. Denies, drainage from these sites or fever but notes that she has associated tenderness.     Review of Systems See HPI  Past Medical History  Diagnosis Date  . Atypical chest pain   . Hyperlipidemia   . Hypertension   . GERD (gastroesophageal reflux disease)   . Diabetes mellitus, type II   . Constipation   . Routine general medical examination at a health care facility   . Gastroparesis   . Iron deficiency anemia   . Esophageal stricture   . Migraine headache   . Endometriosis   . Cyst, ovarian   . Peptic ulcer disease   . Non-compliance     History   Social History  . Marital Status: Married    Spouse Name: N/A    Number of Children: 2  . Years of Education: N/A   Occupational History  . DISABILITY    Social History Main Topics  . Smoking status: Former Games developer  . Smokeless tobacco: Not on file   Comment: Quit in 1996  . Alcohol Use: Yes     soical drinker  . Drug Use: No  . Sexually Active: Not on file   Other Topics Concern  . Not on file   Social History Narrative   Married Has 2 grown children.  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  She was a pack a day smoker for approximately 10 years.Alcohol use-yes: Social Daily Caffeine Use:6 pack of pepsi daily  Illicit Drug Use - no Patient does not get regular exercise.    Smoking Status:  quit    Past Surgical History  Procedure Date  . Abdominal exploration surgery     w/bso   . Knee surgery 2005     left knee  . Total abdominal hysterectomy   . Cholecystectomy   . Cardiac catheterization 2009    mild non obstructive CAD    Family History  Problem Relation Age of  Onset  . Hypertension Mother   . Hypertension Father   . Diabetes Father   . Prostate cancer Father   . Kidney disease Father   . Cancer Father     prostate cancer  . Diabetes Sister   . Diabetes      grandmother  . Other      no FH of colon cancer,    Allergies  Allergen Reactions  . Ace Inhibitors     REACTION: chronic cough    Current Outpatient Prescriptions on File Prior to Visit  Medication Sig Dispense Refill  . amitriptyline (ELAVIL) 25 MG tablet Take 1 tablet (25 mg total) by mouth at bedtime.  30 tablet  3  . cephALEXin (KEFLEX) 500 MG capsule Take 1 capsule (500 mg total) by mouth 4 (four) times daily.  40 capsule  0  . furosemide (LASIX) 20 MG tablet Take 1 tablet (20 mg total) by mouth daily.  30 tablet  3  . magnesium citrate 1.745 GM/30ML SOLN Take 296 mLs by mouth once. As needed       . traMADol (ULTRAM) 50 MG tablet Take 1 tab every 4-6 hours as  needed for pain.  25 tablet  0  . valsartan-hydrochlorothiazide (DIOVAN-HCT) 160-12.5 MG per tablet Take 1 tablet by mouth daily.        . clindamycin (CLEOCIN) 300 MG capsule Take 300 mg by mouth 3 (three) times daily.       Marland Kitchen esomeprazole (NEXIUM) 40 MG capsule Take 40 mg by mouth. 1 capsule each day 30 minutes before meal       . gabapentin (NEURONTIN) 100 MG capsule 1-3 caps 3 x daily as needed (as directed)  90 capsule  2  . ondansetron (ZOFRAN) 4 MG tablet Take 4 mg by mouth 2 (two) times daily as needed. For nausea       . polyethylene glycol powder (MIRALAX) powder Take 17 g by mouth daily. As needed       . potassium chloride SA (K-DUR,KLOR-CON) 20 MEQ tablet Take 20 mEq by mouth daily.        . Sodium Phosphates (FLEET ENEMA RE) Place rectally. As needed         BP 144/99  Pulse 71  Temp(Src) 98.3 F (36.8 C) (Oral)  Resp 18  Wt 230 lb (104.327 kg)       Objective:   Physical Exam  Constitutional: She appears well-developed and well-nourished.  HENT:  Head: Normocephalic and atraumatic.  Skin:         Fluctuant abscess left earlobe, + abscess on chest wall approx 1 cm wide with scant drainage.         Assessment & Plan:   Using sterile technique, the left earlobe was anesthetized with 1% lidocaine.  A small incision was made on the anterior aspect of the left earlobe with minimal drainage. Another small incision was made just anterior to initial incision with some purulent drainage. Fluctuant still noted in the posterior aspect of the earlobe, small incision made in the posterior aspect of the left earlobe with mild drainage. Left earlobe abscess appears clinically to be a complex abscess with multiple loculations.Pressure was applied to the left ear until hemostasis was achieved.  The abscess on the left anterior chest wall was incised after anesthetizing with 1% lidocaine. The tissue is very firm narrowing I suspect she has some scarring due to her underlying skin disorder. A small amount of drainage was achieved after incision.

## 2010-12-13 ENCOUNTER — Ambulatory Visit: Payer: BC Managed Care – PPO | Admitting: Physical Therapy

## 2010-12-15 ENCOUNTER — Encounter: Payer: Self-pay | Admitting: Internal Medicine

## 2010-12-15 ENCOUNTER — Ambulatory Visit: Payer: BC Managed Care – PPO | Admitting: Family

## 2010-12-18 ENCOUNTER — Ambulatory Visit (INDEPENDENT_AMBULATORY_CARE_PROVIDER_SITE_OTHER): Payer: BC Managed Care – PPO | Admitting: Family

## 2010-12-18 ENCOUNTER — Ambulatory Visit: Payer: BC Managed Care – PPO | Admitting: Rehabilitation

## 2010-12-18 ENCOUNTER — Encounter: Payer: Self-pay | Admitting: Family

## 2010-12-18 ENCOUNTER — Encounter: Payer: Self-pay | Admitting: Gastroenterology

## 2010-12-18 ENCOUNTER — Ambulatory Visit (INDEPENDENT_AMBULATORY_CARE_PROVIDER_SITE_OTHER): Payer: BC Managed Care – PPO | Admitting: Gastroenterology

## 2010-12-18 DIAGNOSIS — M545 Low back pain, unspecified: Secondary | ICD-10-CM

## 2010-12-18 DIAGNOSIS — R1013 Epigastric pain: Secondary | ICD-10-CM

## 2010-12-18 DIAGNOSIS — L0292 Furuncle, unspecified: Secondary | ICD-10-CM

## 2010-12-18 DIAGNOSIS — K3184 Gastroparesis: Secondary | ICD-10-CM

## 2010-12-18 NOTE — Progress Notes (Signed)
Subjective:    Patient ID: [name], female    DOB: 11-20-1960, 50 y.o.   MRN: [id number]  HPI  [name] is a 50 yr old female who presents for 1 week follow up of her I and D.  1. Boils- she reports some improvement in the treated areas- occasional drainage from the chest lesion.  Continues antibiotics.  2. Low back pain- she is participating in PT 2x a week for >1 month.  No significant improvement in her pain.  Using Ultram and neurontin without improvement.  Only using neurontin 100mg  bid prn.    Review of Systems  See HPI  Past Medical History  Diagnosis Date  . Atypical chest pain   . Hyperlipidemia   . Hypertension   . GERD (gastroesophageal reflux disease)   . Diabetes mellitus, type II   . Constipation   . Routine general medical examination at a health care facility   . Gastroparesis   . Iron deficiency anemia   . Esophageal stricture   . Migraine headache   . Endometriosis   . Cyst, ovarian   . Peptic ulcer disease   . Non-compliance   . Allergy     allergic rhinitis    History   Social History  . Marital Status: Married    Spouse Name: N/A    Number of Children: 2  . Years of Education: N/A   Occupational History  . DISABILITY    Social History Main Topics  . Smoking status: Former Smoker    Quit date: 06/25/1994  . Smokeless tobacco: Not on file   Comment: Quit in 1996  . Alcohol Use: Yes     soical drinker  . Drug Use: No  . Sexually Active: Not on file   Other Topics Concern  . Not on file   Social History Narrative   Married Has 2 grown children.  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  She was a pack a day smoker for approximately 10 years.Alcohol use-yes: Social Daily Caffeine Use:6 pack of pepsi daily  Illicit Drug Use - no Patient does not get regular exercise.    Smoking Status:  quit    Past Surgical History  Procedure Date  . Abdominal exploration surgery     w/bso   . Knee  surgery 2005     left knee  . Total abdominal hysterectomy   . Cholecystectomy   . Cardiac catheterization 2009    mild non obstructive CAD    Family History  Problem Relation Age of Onset  . Hypertension Mother   . Hypertension Father   . Diabetes Father   . Prostate cancer Father   . Kidney disease Father   . Cancer Father     prostate cancer  . Diabetes Sister   . Diabetes      grandmother  . Other      no FH of colon cancer,    Allergies  Allergen Reactions  . Ace Inhibitors     REACTION: chronic cough    Current Outpatient Prescriptions on File Prior to Visit  Medication Sig Dispense Refill  . amitriptyline (ELAVIL) 25 MG tablet Take 1 tablet (25 mg total) by mouth at bedtime.  30 tablet  3  . clindamycin (CLEOCIN) 300 MG capsule Take 300 mg by mouth 3 (three) times daily.        Marland Kitchen doxycycline (VIBRAMYCIN) 100 MG capsule Take 1 capsule (100 mg total) by mouth 2 (  two) times daily.  14 capsule  0  . esomeprazole (NEXIUM) 40 MG capsule Take 40 mg by mouth. 1 capsule each day 30 minutes before meal       . furosemide (LASIX) 20 MG tablet Take 1 tablet (20 mg total) by mouth daily.  30 tablet  3  . gabapentin (NEURONTIN) 100 MG capsule 1-3 caps 3 x daily as needed (as directed)  90 capsule  2  . magnesium citrate 1.745 GM/30ML SOLN Take 296 mLs by mouth once. As needed       . ondansetron (ZOFRAN) 4 MG tablet Take 4 mg by mouth 2 (two) times daily as needed. For nausea       . polyethylene glycol powder (MIRALAX) powder Take 17 g by mouth daily. As needed       . potassium chloride SA (K-DUR,KLOR-CON) 20 MEQ tablet Take 20 mEq by mouth daily.        . Sodium Phosphates (FLEET ENEMA RE) Place rectally. As needed       . traMADol (ULTRAM) 50 MG tablet Take 1 tab every 4-6 hours as needed for pain.  25 tablet  0  . valsartan-hydrochlorothiazide (DIOVAN-HCT) 160-12.5 MG per tablet Take 1 tablet by mouth daily.          BP 146/96  Pulse 78  Temp(Src) 98.4 F (36.9 C)  (Oral)  Resp 16  Wt 226 lb 0.6 oz (102.531 kg)        Objective:   Physical Exam  Constitutional: She appears well-developed and well-nourished.  Cardiovascular: Normal rate and regular rhythm.   Pulmonary/Chest: Effort normal and breath sounds normal.  Skin:       Left ear lobe, less fluctuant, no erythema, no drainage.  Chest lesion- appears improved, no drainage no erythema.           Assessment & Plan:

## 2010-12-18 NOTE — Assessment & Plan Note (Signed)
Clinically improved.  I suspect that she has a cyst in the left ear lobe.  I instructed her to contact us if it becomes more fluctuant,red, or if drainage.  At that time I would plan referral to a general surgeon for excision.

## 2010-12-18 NOTE — Assessment & Plan Note (Addendum)
She has well established gastroparesis as determined by gastric empty scan and has failed medical therapy and gastroparesis diet.  Recommendations #1 refer to Dr. Claire Shown at [facility] for consideration of further therapy including  gastric pacemaker

## 2010-12-18 NOTE — Progress Notes (Signed)
History of Present Illness:  [name] has returned for followup of her nausea and abdominal pain. The latter has been minimal. Her main complaint is severe nausea with frequent vomiting. She has well-established gastroparesis for which she has undergone various therapies including Reglan, erythromycin and domperidone. A gastroparesis diet has also been unsuccessful. She is generally miserable because of constant nausea and frequent vomiting. At the same time, her weight has been stable.    Review of Systems: Pertinent positive and negative review of systems were noted in the above HPI section. All other review of systems were otherwise negative.    Current Medications, Allergies, Past Medical History, Past Surgical History, Family History and Social History were reviewed in [employer] [facility] electronic medical record  Vital signs were reviewed in today's medical record. Physical Exam: General: Well developed , well nourished, no acute distress On abdominal exam abdomen is soft and nontender. There is no succussion splash. There are no abnormal masses or organomegaly

## 2010-12-18 NOTE — Assessment & Plan Note (Signed)
This a minimal problem now and may, in part, be due to gastroparesis

## 2010-12-18 NOTE — Assessment & Plan Note (Signed)
>>  ASSESSMENT AND PLAN FOR LOW BACK PAIN WRITTEN ON 12/18/2010 10:54 AM BY O'SULLIVAN, Hanah Moultry, NP  I recommended that she continue her weight loss efforts, PT and tramadol  PRN.  Also, recommended that she try to titrate up her neurontin  to 300 mg TID as tolerated to see if this helps her pain symptoms.

## 2010-12-18 NOTE — Patient Instructions (Signed)
Keep working hard on diet, exercise and weight loss. Call if you develop fever, increased swelling or drainage from your left ear or chest wall.  Continue therapy. Follow up in 3 months.

## 2010-12-18 NOTE — Patient Instructions (Signed)
We will refer you to see Dr Claire Shown at [facility].  We will contact you once that appointment becomes available

## 2010-12-18 NOTE — Assessment & Plan Note (Signed)
I recommended that she continue her weight loss efforts, PT and tramadol PRN.  Also, recommended that she try to titrate up her neurontin to 300 mg TID as tolerated to see if this helps her pain symptoms.

## 2010-12-19 ENCOUNTER — Telehealth: Payer: Self-pay | Admitting: Gastroenterology

## 2010-12-19 ENCOUNTER — Ambulatory Visit: Payer: BC Managed Care – PPO | Admitting: Rehabilitation

## 2010-12-20 ENCOUNTER — Ambulatory Visit: Payer: BC Managed Care – PPO | Admitting: Rehabilitation

## 2010-12-20 NOTE — Telephone Encounter (Signed)
Pts appointment has been scheduled with Dr Claire Shown on 04/18/2011 and has been put on a waiting list for a sooner appointment

## 2010-12-26 ENCOUNTER — Ambulatory Visit: Payer: BC Managed Care – PPO | Admitting: Rehabilitation

## 2010-12-29 ENCOUNTER — Ambulatory Visit: Payer: BC Managed Care – PPO | Attending: Internal Medicine | Admitting: Rehabilitation

## 2010-12-29 DIAGNOSIS — M545 Low back pain, unspecified: Secondary | ICD-10-CM | POA: Insufficient documentation

## 2010-12-29 DIAGNOSIS — IMO0001 Reserved for inherently not codable concepts without codable children: Secondary | ICD-10-CM | POA: Insufficient documentation

## 2010-12-29 DIAGNOSIS — M2569 Stiffness of other specified joint, not elsewhere classified: Secondary | ICD-10-CM | POA: Insufficient documentation

## 2011-01-02 ENCOUNTER — Telehealth: Payer: Self-pay | Admitting: *Deleted

## 2011-01-02 DIAGNOSIS — K921 Melena: Secondary | ICD-10-CM

## 2011-01-02 DIAGNOSIS — R112 Nausea with vomiting, unspecified: Secondary | ICD-10-CM

## 2011-01-02 MED ORDER — BUTALBITAL-ACETAMINOPHEN 50-650 MG PO TABS: 1.0000 | ORAL_TABLET | Freq: Four times a day (QID) | ORAL | Status: DC | PRN

## 2011-01-02 MED ORDER — PROMETHAZINE HCL 25 MG PO TABS: 25.0000 mg | ORAL_TABLET | Freq: Three times a day (TID) | ORAL | Status: DC | PRN

## 2011-01-02 NOTE — Telephone Encounter (Signed)
Pt called stating she has a migraine.  Pt has history of migraines.  She does not have any oral meds at home for migraines.  She c/o nausea and vomiting, photo/phono stimulus makes HA worse.  HA pain is at 8 of 10.  Pt is taking amitriptyline as preventative.  Pt tried to take ES Tylenol last night, but had episode of vomiting shortly thereafter.  Pt has previously seen Dr. Artist Pais and the ER for injections. Pt believes if she could have oral antiemetic and pain reliever she would be able to keep these down and would be able to get some rest and hopefully HA will go away without injection.  Pharm: [facility]/[place]. Please advise.

## 2011-01-02 NOTE — Telephone Encounter (Signed)
OK to give Promethazine 25mg  tab, 1 tab po q 8 hours prn n/v, disp #15, no refill. For pain can have Bupap 1 tab po q 6 hours prn HA, disp #20, no refill, come in if pain does not resolve

## 2011-01-02 NOTE — Telephone Encounter (Signed)
RX's sent.  Pt notified via voicemail that meds had been sent and to call office for appt if she does not improve.

## 2011-01-24 ENCOUNTER — Telehealth: Payer: Self-pay | Admitting: *Deleted

## 2011-01-24 NOTE — Telephone Encounter (Signed)
Patient called and left voice message stating she has developed a boil on the right side of her breast with pain that is starting to radiate under her right arm. She would like to know what she should do.  Call placed to patient [phone], she was advised office visit was needed. Appointment scheduled for 01/25/2011 @ 11:30a

## 2011-01-25 ENCOUNTER — Ambulatory Visit (INDEPENDENT_AMBULATORY_CARE_PROVIDER_SITE_OTHER): Payer: BC Managed Care – PPO | Admitting: Internal Medicine

## 2011-01-25 ENCOUNTER — Encounter: Payer: Self-pay | Admitting: Internal Medicine

## 2011-01-25 DIAGNOSIS — M791 Myalgia, unspecified site: Secondary | ICD-10-CM | POA: Insufficient documentation

## 2011-01-25 DIAGNOSIS — L0292 Furuncle, unspecified: Secondary | ICD-10-CM

## 2011-01-25 DIAGNOSIS — R5383 Other fatigue: Secondary | ICD-10-CM | POA: Insufficient documentation

## 2011-01-25 DIAGNOSIS — R5381 Other malaise: Secondary | ICD-10-CM

## 2011-01-25 DIAGNOSIS — M2550 Pain in unspecified joint: Secondary | ICD-10-CM

## 2011-01-25 DIAGNOSIS — IMO0001 Reserved for inherently not codable concepts without codable children: Secondary | ICD-10-CM

## 2011-01-25 DIAGNOSIS — M6281 Muscle weakness (generalized): Secondary | ICD-10-CM

## 2011-01-25 LAB — CK: Total CK: 93 U/L (ref 7–177)

## 2011-01-25 LAB — TSH: TSH: 0.301 u[IU]/mL — ABNORMAL LOW (ref 0.350–4.500)

## 2011-01-25 LAB — SEDIMENTATION RATE: Sed Rate: 27 mm/hr — ABNORMAL HIGH (ref 0–22)

## 2011-01-25 MED ORDER — SULFAMETHOXAZOLE-TRIMETHOPRIM 800-160 MG PO TABS: 1.0000 | ORAL_TABLET | Freq: Two times a day (BID) | ORAL | Status: AC

## 2011-01-25 NOTE — Progress Notes (Signed)
  Subjective:    Patient ID: [name], female    DOB: [birth year], 50 y.o.   MRN: [id number]  HPI Pt presents to clinic for evaluation of fatigue and possible skin infection. H/o recurrent abscesses s/p recent I&D of chest boil. No confirmed mrsa. Chest boil persistent but not draining. Now with right inframammary focal redness. No wound or drainage. Also notes 1 month h/o fatigue with associated shoulder girdle/proximal muscle pain, soreness and weakness. No radiating neck pain. Pain worse with position change. Denies cough, dyspnea, chest pain or rash.  No other alleviating or exacerbating factors. No other complaints.  Reviewed pmh, medications and allergies.    Review of Systems  See hpi     Objective:   Physical Exam  Nursing note and vitals reviewed. Constitutional: She appears well-developed and well-nourished. No distress.  HENT:  Head: Normocephalic and atraumatic.  Right Ear: External ear normal.  Left Ear: External ear normal.  Eyes: Conjunctivae are normal. No scleral icterus.  Musculoskeletal:       FROM bilateral shoulders and upper arms.  5/5 bilateral ue strength. +tenderness diffusely along soft tissue of shoulders/trapezius  Neurological: She is alert.  Skin: Skin is warm and dry. No rash noted. She is not diaphoretic. There is erythema.       With female nurse escort exam is performed. Right side below breast fold shows area of 2cm redness and induration without expressible discharge.  Psychiatric: She has a normal mood and affect.          Assessment & Plan:

## 2011-01-25 NOTE — Assessment & Plan Note (Signed)
With associated fatigue, myalgias, arthralgias and subjective weakness. Obtain cpk, ana and esr.

## 2011-01-25 NOTE — Assessment & Plan Note (Signed)
Obtain tsh  

## 2011-01-25 NOTE — Assessment & Plan Note (Signed)
Recurrent. Suspect cellulitis vs early abscess formation along right lower breast. Begin septra. Followup if no improvement or worsening.

## 2011-01-26 LAB — ANA: Anti Nuclear Antibody(ANA): NEGATIVE

## 2011-01-29 ENCOUNTER — Ambulatory Visit: Payer: BC Managed Care – PPO

## 2011-01-29 ENCOUNTER — Other Ambulatory Visit: Payer: Self-pay | Admitting: Internal Medicine

## 2011-01-29 LAB — TSH: TSH: 0.28 u[IU]/mL — ABNORMAL LOW (ref 0.35–5.50)

## 2011-01-29 LAB — T4, FREE: Free T4: 0.9 ng/dL (ref 0.60–1.60)

## 2011-02-01 ENCOUNTER — Encounter (HOSPITAL_BASED_OUTPATIENT_CLINIC_OR_DEPARTMENT_OTHER): Payer: BC Managed Care – PPO | Admitting: Hematology & Oncology

## 2011-02-01 ENCOUNTER — Other Ambulatory Visit: Payer: Self-pay | Admitting: Hematology & Oncology

## 2011-02-01 ENCOUNTER — Telehealth: Payer: Self-pay | Admitting: *Deleted

## 2011-02-01 DIAGNOSIS — D560 Alpha thalassemia: Secondary | ICD-10-CM

## 2011-02-01 DIAGNOSIS — D509 Iron deficiency anemia, unspecified: Secondary | ICD-10-CM

## 2011-02-01 LAB — RETICULOCYTES (CHCC)
ABS Retic: 35.4 10*3/uL (ref 19.0–186.0)
RBC.: 5.05 MIL/uL (ref 3.87–5.11)
Retic Ct Pct: 0.7 % (ref 0.4–2.3)

## 2011-02-01 LAB — CBC WITH DIFFERENTIAL (CANCER CENTER ONLY)
BASO#: 0 10*3/uL (ref 0.0–0.2)
BASO%: 0.3 % (ref 0.0–2.0)
EOS%: 1.4 % (ref 0.0–7.0)
Eosinophils Absolute: 0.1 10*3/uL (ref 0.0–0.5)
HCT: 35.3 % (ref 34.8–46.6)
HGB: 11.4 g/dL — ABNORMAL LOW (ref 11.6–15.9)
LYMPH#: 3.2 10*3/uL (ref 0.9–3.3)
LYMPH%: 45.1 % (ref 14.0–48.0)
MCH: 22.7 pg — ABNORMAL LOW (ref 26.0–34.0)
MCHC: 32.3 g/dL (ref 32.0–36.0)
MCV: 70 fL — ABNORMAL LOW (ref 81–101)
MONO#: 0.5 10*3/uL (ref 0.1–0.9)
MONO%: 7.3 % (ref 0.0–13.0)
NEUT#: 3.2 10*3/uL (ref 1.5–6.5)
NEUT%: 45.9 % (ref 39.6–80.0)
Platelets: 214 10*3/uL (ref 145–400)
RBC: 5.03 10*6/uL (ref 3.70–5.32)
RDW: 15.1 % (ref 11.1–15.7)
WBC: 7 10*3/uL (ref 3.9–10.0)

## 2011-02-01 LAB — IRON AND TIBC
%SAT: 27 % (ref 20–55)
Iron: 78 ug/dL (ref 42–145)
TIBC: 290 ug/dL (ref 250–470)
UIBC: 212 ug/dL

## 2011-02-01 LAB — FERRITIN: Ferritin: 479 ng/mL — ABNORMAL HIGH (ref 10–291)

## 2011-02-01 NOTE — Telephone Encounter (Signed)
Second more accurate thyroid test nl. Could develop thyroid issues in the future. Needs tsh/free t4 (abn thyroid tests) in 4-5 months.  Call placed to patient, she was informed of results per Dr Rodena Medin instruction.

## 2011-02-01 NOTE — Telephone Encounter (Signed)
Patient presented in office as walk in for lab results. She was informed labs have not yet been reviewed by provider, and was advised that after review, she would be contacted regarding the results.

## 2011-02-08 ENCOUNTER — Other Ambulatory Visit: Payer: Self-pay | Admitting: Internal Medicine

## 2011-02-09 NOTE — Telephone Encounter (Signed)
Rx refill sent to pharmacy. 

## 2011-03-16 LAB — BASIC METABOLIC PANEL
BUN: 16
CO2: 26
Calcium: 9.1
Chloride: 104
Creatinine, Ser: 0.7
GFR calc Af Amer: >60
GFR calc non Af Amer: >60
Glucose, Bld: 88
Potassium: 3.8
Sodium: 139

## 2011-03-16 LAB — URINALYSIS, ROUTINE W REFLEX MICROSCOPIC
Bilirubin Urine: NEGATIVE
Glucose, UA: NEGATIVE
Hgb urine dipstick: NEGATIVE
Ketones, ur: NEGATIVE
Nitrite: NEGATIVE
Protein, ur: NEGATIVE
Specific Gravity, Urine: 1.025
Urobilinogen, UA: 1
pH: 7

## 2011-03-16 LAB — CBC
HCT: 31.7 — ABNORMAL LOW
Hemoglobin: 10.1 — ABNORMAL LOW
MCHC: 31.9
MCV: 67.7 — ABNORMAL LOW
Platelets: 221
RBC: 4.67
RDW: 15.6 — ABNORMAL HIGH
WBC: 7.1

## 2011-03-19 ENCOUNTER — Ambulatory Visit: Payer: BC Managed Care – PPO | Admitting: Family

## 2011-03-21 ENCOUNTER — Ambulatory Visit: Payer: BC Managed Care – PPO | Admitting: Family

## 2011-03-21 ENCOUNTER — Other Ambulatory Visit: Payer: Self-pay | Admitting: *Deleted

## 2011-03-21 NOTE — Telephone Encounter (Signed)
Patient called and left voice message stating she had a death in her family, and she is starting to experience migraine headaches with nausea and would like to know if something could be called into her pharmacy for her.

## 2011-03-21 NOTE — Telephone Encounter (Signed)
I would recommend trial of aleve.  If no improvement, then she will need office visit please.

## 2011-03-22 NOTE — Telephone Encounter (Signed)
Advised pt re: instructions below. Pt states OTC meds are not helping. Reports that she was seen in [facility] ER while visiting sick family member and was given 3 shots and an Rx that she has lost. Does not remember what the medication was. Schedule pt appt for 03/23/11 at 11am.

## 2011-03-23 ENCOUNTER — Ambulatory Visit: Payer: BC Managed Care – PPO | Admitting: Family

## 2011-03-23 DIAGNOSIS — Z0289 Encounter for other administrative examinations: Secondary | ICD-10-CM

## 2011-03-23 LAB — GLUCOSE, CAPILLARY: Glucose-Capillary: 98

## 2011-03-23 LAB — POCT I-STAT GLUCOSE
Glucose, Bld: 97
Operator id: 178271

## 2011-03-27 ENCOUNTER — Ambulatory Visit: Payer: BC Managed Care – PPO | Admitting: Family

## 2011-03-28 ENCOUNTER — Encounter (HOSPITAL_BASED_OUTPATIENT_CLINIC_OR_DEPARTMENT_OTHER): Payer: BC Managed Care – PPO | Admitting: Hematology & Oncology

## 2011-03-28 ENCOUNTER — Encounter: Payer: Self-pay | Admitting: Family

## 2011-03-28 ENCOUNTER — Other Ambulatory Visit: Payer: Self-pay | Admitting: Hematology & Oncology

## 2011-03-28 ENCOUNTER — Ambulatory Visit (INDEPENDENT_AMBULATORY_CARE_PROVIDER_SITE_OTHER): Payer: Medicare Other | Admitting: Family

## 2011-03-28 VITALS — BP 122/80 | HR 60 | Temp 97.9°F | Resp 16 | Ht 64.02 in | Wt 228.0 lb

## 2011-03-28 DIAGNOSIS — M545 Low back pain, unspecified: Secondary | ICD-10-CM

## 2011-03-28 DIAGNOSIS — G43909 Migraine, unspecified, not intractable, without status migrainosus: Secondary | ICD-10-CM

## 2011-03-28 DIAGNOSIS — I10 Essential (primary) hypertension: Secondary | ICD-10-CM

## 2011-03-28 DIAGNOSIS — R51 Headache: Secondary | ICD-10-CM

## 2011-03-28 DIAGNOSIS — L0292 Furuncle, unspecified: Secondary | ICD-10-CM

## 2011-03-28 DIAGNOSIS — D509 Iron deficiency anemia, unspecified: Secondary | ICD-10-CM

## 2011-03-28 DIAGNOSIS — G5620 Lesion of ulnar nerve, unspecified upper limb: Secondary | ICD-10-CM

## 2011-03-28 LAB — CBC WITH DIFFERENTIAL (CANCER CENTER ONLY)
BASO#: 0 10*3/uL (ref 0.0–0.2)
BASO%: 0.3 % (ref 0.0–2.0)
EOS%: 1.7 % (ref 0.0–7.0)
Eosinophils Absolute: 0.1 10*3/uL (ref 0.0–0.5)
HCT: 35.5 % (ref 34.8–46.6)
HGB: 11.4 g/dL — ABNORMAL LOW (ref 11.6–15.9)
LYMPH#: 2.9 10*3/uL (ref 0.9–3.3)
LYMPH%: 48.7 % — ABNORMAL HIGH (ref 14.0–48.0)
MCH: 22.9 pg — ABNORMAL LOW (ref 26.0–34.0)
MCHC: 32.1 g/dL (ref 32.0–36.0)
MCV: 71 fL — ABNORMAL LOW (ref 81–101)
MONO#: 0.3 10*3/uL (ref 0.1–0.9)
MONO%: 5.5 % (ref 0.0–13.0)
NEUT#: 2.7 10*3/uL (ref 1.5–6.5)
NEUT%: 43.8 % (ref 39.6–80.0)
Platelets: 235 10*3/uL (ref 145–400)
RBC: 4.97 10*6/uL (ref 3.70–5.32)
RDW: 15 % (ref 11.1–15.7)
WBC: 6 10*3/uL (ref 3.9–10.0)

## 2011-03-28 LAB — CHCC SATELLITE - SMEAR

## 2011-03-28 MED ORDER — KETOROLAC TROMETHAMINE 60 MG/2ML IM SOLN
60.0000 mg | Freq: Once | INTRAMUSCULAR | Status: AC
  Administered 2011-03-28: 60 mg via INTRAMUSCULAR

## 2011-03-28 MED ORDER — CEPHALEXIN 500 MG PO CAPS: 500.0000 mg | ORAL_CAPSULE | Freq: Four times a day (QID) | ORAL | Status: AC

## 2011-03-28 MED ORDER — SUMATRIPTAN SUCCINATE 50 MG PO TABS: ORAL_TABLET | ORAL | Status: DC

## 2011-03-28 NOTE — Progress Notes (Signed)
Subjective:    Patient ID: [name], female    DOB: [birth year], 50 y.o.   MRN: [id number]  HPI  [name] is a 50 yr old female who presents today for follow up.  She has multiple concerns.    1) Back pain- reports that she completed therapy.  She reports some improvement with PT.  She continues to have back pain.  Constant dull ache in low back.  No worsening, no improving.  She has tried tramadol, ice, heat, rest, no improvement.  Numbness/tingling in the right leg which stops in the right thigh.    2) Numbness- left 4th/5th finger.  3) HA-  Daily for 2 weeks- dad recently died, she was treated in the ED for HA at [facility], without improvement.  Mild, constant and throbbing. Reports that she checked her BP at [employer] 197/103.    4) HTN-  C/o heart racing. Denies edema.   5) DM- denies symptomatic hypoglycemia.    6) skin infections- reports that over the weekend her eye was swollen and drained blood and pus.  Has area on her chest wall which feels hard, not coming to a head with the warm compresses.        Review of Systems See HPI  Past Medical History  Diagnosis Date  . Atypical chest pain   . Hyperlipidemia   . Hypertension   . GERD (gastroesophageal reflux disease)   . Diabetes mellitus, type II   . Constipation   . Routine general medical examination at a health care facility   . Gastroparesis   . Iron deficiency anemia   . Esophageal stricture   . Migraine headache   . Endometriosis   . Cyst, ovarian   . Peptic ulcer disease   . Non-compliance   . Allergy     allergic rhinitis    History   Social History  . Marital Status: Married    Spouse Name: N/A    Number of Children: 2  . Years of Education: N/A   Occupational History  . DISABILITY    Social History Main Topics  . Smoking status: Former Smoker    Quit date: 06/25/1994  . Smokeless tobacco: Never Used   Comment: Quit in 1996  . Alcohol Use: Yes     soical drinker  .  Drug Use: No  . Sexually Active: Not on file   Other Topics Concern  . Not on file   Social History Narrative   Married Has 2 grown children.  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  She was a pack a day smoker for approximately 10 years.Alcohol use-yes: Social Daily Caffeine Use:6 pack of pepsi daily  Illicit Drug Use - no Patient does not get regular exercise.    Smoking Status:  quit    Past Surgical History  Procedure Date  . Abdominal exploration surgery     w/bso   . Knee surgery 2005     left knee  . Total abdominal hysterectomy   . Cholecystectomy   . Cardiac catheterization 2009    mild non obstructive CAD    Family History  Problem Relation Age of Onset  . Hypertension Mother   . Hypertension Father   . Diabetes Father   . Prostate cancer Father   . Kidney disease Father   . Cancer Father     prostate cancer  . Diabetes Sister   . Diabetes  grandmother  . Other      no FH of colon cancer,  . Lung cancer Maternal Grandfather     Allergies  Allergen Reactions  . Ace Inhibitors     REACTION: chronic cough    Current Outpatient Prescriptions on File Prior to Visit  Medication Sig Dispense Refill  . magnesium citrate 1.745 GM/30ML SOLN Take 296 mLs by mouth once. As needed       . polyethylene glycol powder (MIRALAX) powder Take 17 g by mouth daily. As needed       . Sodium Phosphates (FLEET ENEMA RE) Place rectally. As needed       . valsartan-hydrochlorothiazide (DIOVAN-HCT) 160-12.5 MG per tablet Take 1 tablet by mouth daily.        . traMADol (ULTRAM) 50 MG tablet Take 1 tab every 4-6 hours as needed for pain.  25 tablet  0   No current facility-administered medications on file prior to visit.    BP 122/80  Pulse 60  Temp(Src) 97.9 F (36.6 C) (Oral)  Resp 16  Ht 5' 4.02" (1.626 m)  Wt 228 lb (103.42 kg)  BMI 39.12 kg/m2       Objective:   Physical Exam  Constitutional: She appears well-developed  and well-nourished.       Sitting in chair eyes closed due to headache. Appears uncomfortable.   HENT:  Head: Normocephalic and atraumatic.  Mouth/Throat: No oropharyngeal exudate.  Eyes: Conjunctivae are normal. Pupils are equal, round, and reactive to light.  Cardiovascular: Normal rate and regular rhythm.   Pulmonary/Chest: Effort normal and breath sounds normal. No respiratory distress. She has no wheezes. She has no rales. She exhibits no tenderness.  Abdominal: Soft. Bowel sounds are normal.  Skin:       Firm scarring noted between breasts, no erythema.  1 inch wide shallow area of induration noted on left cheek.  Small ulcerated opening note in center  Psychiatric: She has a normal mood and affect. Her behavior is normal. Judgment normal.          Assessment & Plan:

## 2011-03-28 NOTE — Patient Instructions (Addendum)
You will be contacted about your referral to Neurosurgery. Call if the area below your eye does not continue to improve with the antibiotics. Follow up in 3 months.

## 2011-03-30 DIAGNOSIS — G5620 Lesion of ulnar nerve, unspecified upper limb: Secondary | ICD-10-CM | POA: Insufficient documentation

## 2011-03-30 NOTE — Assessment & Plan Note (Signed)
Suspect that numbness left 4th/5th fingers due to mild ulnar neuropathy.

## 2011-03-30 NOTE — Assessment & Plan Note (Signed)
Will give trial of imitrex. 

## 2011-03-30 NOTE — Assessment & Plan Note (Signed)
BP is stable on diovan HCT.

## 2011-03-30 NOTE — Assessment & Plan Note (Signed)
Deteriorated. Refer to Neurosurgery.

## 2011-03-30 NOTE — Assessment & Plan Note (Signed)
>>  ASSESSMENT AND PLAN FOR LOW BACK PAIN WRITTEN ON 03/30/2011  8:04 PM BY O'SULLIVAN, Londynn Sonoda, NP  Deteriorated. Refer to Neurosurgery.

## 2011-03-30 NOTE — Assessment & Plan Note (Signed)
Now with resolving boil/cellulitis of the left cheek.  Plan rx with keflex.  Pt to call if symptoms worsen or do not resolve.

## 2011-04-01 LAB — RETICULOCYTES (CHCC)
ABS Retic: 60.4 10*3/uL (ref 19.0–186.0)
RBC.: 5.03 MIL/uL (ref 3.87–5.11)
Retic Ct Pct: 1.2 % (ref 0.4–2.3)

## 2011-04-01 LAB — IRON AND TIBC
%SAT: 19 % — ABNORMAL LOW (ref 20–55)
Iron: 58 ug/dL (ref 42–145)
TIBC: 301 ug/dL (ref 250–470)
UIBC: 243 ug/dL (ref 125–400)

## 2011-04-01 LAB — TRANSFERRIN RECEPTOR, SOLUABLE: Transferrin Receptor, Soluble: 1.09 mg/L (ref 0.76–1.76)

## 2011-04-01 LAB — VITAMIN D 25 HYDROXY (VIT D DEFICIENCY, FRACTURES): Vit D, 25-Hydroxy: 18 ng/mL — ABNORMAL LOW (ref 30–89)

## 2011-04-01 LAB — FERRITIN: Ferritin: 288 ng/mL (ref 10–291)

## 2011-04-03 LAB — CBC
HCT: 32.5 — ABNORMAL LOW
Hemoglobin: 10.3 — ABNORMAL LOW
MCHC: 31.8
MCV: 66.5 — ABNORMAL LOW
Platelets: 243
RBC: 4.89
RDW: 16.5 — ABNORMAL HIGH
WBC: 8

## 2011-04-03 LAB — BASIC METABOLIC PANEL
BUN: 13
CO2: 25
Calcium: 8.6
Chloride: 104
Creatinine, Ser: 0.92
GFR calc Af Amer: >60
GFR calc non Af Amer: >60
Glucose, Bld: 90
Potassium: 3.6
Sodium: 136

## 2011-04-03 LAB — POCT CARDIAC MARKERS
CKMB, poc: 1.5
Myoglobin, poc: 86.9
Operator id: 1192
Troponin i, poc: <0.05

## 2011-04-03 LAB — D-DIMER, QUANTITATIVE (NOT AT ARMC): D-Dimer, Quant: 0.24

## 2011-04-18 DIAGNOSIS — K5900 Constipation, unspecified: Secondary | ICD-10-CM | POA: Insufficient documentation

## 2011-04-18 DIAGNOSIS — R1319 Other dysphagia: Secondary | ICD-10-CM

## 2011-04-18 DIAGNOSIS — IMO0001 Reserved for inherently not codable concepts without codable children: Secondary | ICD-10-CM | POA: Insufficient documentation

## 2011-04-18 DIAGNOSIS — R112 Nausea with vomiting, unspecified: Secondary | ICD-10-CM | POA: Insufficient documentation

## 2011-04-30 ENCOUNTER — Other Ambulatory Visit: Payer: Self-pay | Admitting: Family

## 2011-04-30 ENCOUNTER — Encounter: Payer: Self-pay | Admitting: Family

## 2011-04-30 ENCOUNTER — Ambulatory Visit (INDEPENDENT_AMBULATORY_CARE_PROVIDER_SITE_OTHER): Payer: Medicare Other | Admitting: Family

## 2011-04-30 VITALS — BP 132/86 | HR 84 | Temp 98.0°F | Resp 16 | Ht 64.0 in | Wt 223.1 lb

## 2011-04-30 DIAGNOSIS — I10 Essential (primary) hypertension: Secondary | ICD-10-CM

## 2011-04-30 DIAGNOSIS — G43909 Migraine, unspecified, not intractable, without status migrainosus: Secondary | ICD-10-CM

## 2011-04-30 DIAGNOSIS — E785 Hyperlipidemia, unspecified: Secondary | ICD-10-CM

## 2011-04-30 DIAGNOSIS — R300 Dysuria: Secondary | ICD-10-CM

## 2011-04-30 DIAGNOSIS — Z23 Encounter for immunization: Secondary | ICD-10-CM

## 2011-04-30 LAB — BASIC METABOLIC PANEL
BUN: 14 mg/dL (ref 6–23)
CO2: 27 mEq/L (ref 19–32)
Calcium: 9.5 mg/dL (ref 8.4–10.5)
Chloride: 101 mEq/L (ref 96–112)
Creat: 0.9 mg/dL (ref 0.50–1.10)
Glucose, Bld: 91 mg/dL (ref 70–99)
Potassium: 4.4 mEq/L (ref 3.5–5.3)
Sodium: 138 mEq/L (ref 135–145)

## 2011-04-30 LAB — POCT URINALYSIS DIPSTICK
Bilirubin, UA: NEGATIVE
Glucose, UA: NEGATIVE
Ketones, UA: NEGATIVE
Nitrite, UA: NEGATIVE
Spec Grav, UA: 1.025
Urobilinogen, UA: 0.2
pH, UA: 7

## 2011-04-30 MED ORDER — ERGOCALCIFEROL 1.25 MG (50000 UT) PO CAPS: 50000.0000 [IU] | ORAL_CAPSULE | ORAL | Status: DC

## 2011-04-30 MED ORDER — VALSARTAN-HYDROCHLOROTHIAZIDE 160-12.5 MG PO TABS: 1.0000 | ORAL_TABLET | Freq: Every day | ORAL | Status: DC

## 2011-04-30 MED ORDER — SUMATRIPTAN SUCCINATE 50 MG PO TABS: ORAL_TABLET | ORAL | Status: DC

## 2011-04-30 MED ORDER — CIPROFLOXACIN HCL 500 MG PO TABS: 500.0000 mg | ORAL_TABLET | Freq: Two times a day (BID) | ORAL | Status: AC

## 2011-04-30 MED ORDER — TRAMADOL HCL 50 MG PO TABS: ORAL_TABLET | ORAL | Status: DC

## 2011-04-30 NOTE — Patient Instructions (Signed)
Please complete your blood work prior to leaving.  Start vitamin D.  Follow up in 3 months.

## 2011-04-30 NOTE — Progress Notes (Signed)
Subjective:    Patient ID: [name], female    DOB: Dec 30, 1960, 50 y.o.   MRN: [id number]  HPI  [name] is a 50 yr old female who presents today for follow up.  1) HTN-  Tolerating diovan HCT without problems.   2) Dark urine- notes + dysuria.  Bad odor to the urine. Symptoms started last week. Denies fever.   3) Migraines- She reports no recent migraines. No significant improvement with the imitrex.  She reports that she used to have freqently. Now only getting once a month.    Review of Systems See HPI  Past Medical History  Diagnosis Date  . Atypical chest pain   . Hyperlipidemia   . Hypertension   . GERD (gastroesophageal reflux disease)   . Diabetes mellitus, type II   . Constipation   . Routine general medical examination at a health care facility   . Gastroparesis   . Iron deficiency anemia   . Esophageal stricture   . Migraine headache   . Endometriosis   . Cyst, ovarian   . Peptic ulcer disease   . Non-compliance   . Allergy     allergic rhinitis    History   Social History  . Marital Status: Married    Spouse Name: N/A    Number of Children: 2  . Years of Education: N/A   Occupational History  . DISABILITY    Social History Main Topics  . Smoking status: Former Smoker    Quit date: 06/25/1994  . Smokeless tobacco: Never Used   Comment: Quit in 1996  . Alcohol Use: Yes     soical drinker  . Drug Use: No  . Sexually Active: Not on file   Other Topics Concern  . Not on file   Social History Narrative   Married Has 2 grown children.  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  She was a pack a day smoker for approximately 10 years.Alcohol use-yes: Social Daily Caffeine Use:6 pack of pepsi daily  Illicit Drug Use - no Patient does not get regular exercise.    Smoking Status:  quit    Past Surgical History  Procedure Date  . Abdominal exploration surgery     w/bso   . Knee surgery 2005     left  knee  . Total abdominal hysterectomy   . Cholecystectomy   . Cardiac catheterization 2009    mild non obstructive CAD    Family History  Problem Relation Age of Onset  . Hypertension Mother   . Hypertension Father   . Diabetes Father   . Prostate cancer Father   . Kidney disease Father   . Cancer Father     prostate cancer  . Diabetes Sister   . Diabetes      grandmother  . Other      no FH of colon cancer,  . Lung cancer Maternal Grandfather     Allergies  Allergen Reactions  . Ace Inhibitors     REACTION: chronic cough    Current Outpatient Prescriptions on File Prior to Visit  Medication Sig Dispense Refill  . magnesium citrate 1.745 GM/30ML SOLN Take 296 mLs by mouth once. As needed       . polyethylene glycol powder (MIRALAX) powder Take 17 g by mouth daily. As needed       . Sodium Phosphates (FLEET ENEMA RE) Place rectally. As needed  BP 132/86  Pulse 84  Temp(Src) 98 F (36.7 C) (Oral)  Resp 16  Ht 5\' 4"  (1.626 m)  Wt 223 lb 1.3 oz (101.188 kg)  BMI 38.29 kg/m2       Objective:   Physical Exam  Constitutional: She appears well-developed and well-nourished.  Cardiovascular: Normal rate and regular rhythm.   No murmur heard. Pulmonary/Chest: Effort normal and breath sounds normal. No respiratory distress. She has no wheezes. She has no rales. She exhibits no tenderness.  Abdominal: Soft. She exhibits no distension. There is no tenderness.  Skin: Skin is warm and dry.  Psychiatric: She has a normal mood and affect. Her behavior is normal. Judgment and thought content normal.          Assessment & Plan:

## 2011-05-01 ENCOUNTER — Encounter: Payer: Self-pay | Admitting: Family

## 2011-05-02 ENCOUNTER — Encounter: Payer: Self-pay | Admitting: Family

## 2011-05-02 DIAGNOSIS — R300 Dysuria: Secondary | ICD-10-CM | POA: Insufficient documentation

## 2011-05-02 LAB — URINE CULTURE: Colony Count: 4000

## 2011-05-02 NOTE — Assessment & Plan Note (Signed)
Stable. Cont diovan hct, obtain BMET.

## 2011-05-02 NOTE — Assessment & Plan Note (Signed)
UA with trace leuks and trace blood.  Treated empirically with cipro x 3 days.

## 2011-05-02 NOTE — Assessment & Plan Note (Signed)
Migraines are infrequent.  Continue Imitrex PRN.

## 2011-05-02 NOTE — Assessment & Plan Note (Signed)
BP Readings from Last 3 Encounters:  04/30/11 132/86  03/28/11 122/80  01/25/11 130/90  BP stable. Continue Diovan HCT

## 2011-05-16 ENCOUNTER — Ambulatory Visit (HOSPITAL_BASED_OUTPATIENT_CLINIC_OR_DEPARTMENT_OTHER): Payer: BC Managed Care – PPO | Admitting: Hematology & Oncology

## 2011-05-16 ENCOUNTER — Other Ambulatory Visit: Payer: Self-pay | Admitting: Hematology & Oncology

## 2011-05-16 ENCOUNTER — Other Ambulatory Visit (HOSPITAL_BASED_OUTPATIENT_CLINIC_OR_DEPARTMENT_OTHER): Payer: Medicare Other | Admitting: Lab

## 2011-05-16 VITALS — BP 128/82 | HR 71 | Temp 97.5°F | Ht 64.0 in | Wt 219.0 lb

## 2011-05-16 DIAGNOSIS — D560 Alpha thalassemia: Secondary | ICD-10-CM

## 2011-05-16 DIAGNOSIS — D509 Iron deficiency anemia, unspecified: Secondary | ICD-10-CM

## 2011-05-16 LAB — CBC WITH DIFFERENTIAL (CANCER CENTER ONLY)
BASO#: 0 10*3/uL (ref 0.0–0.2)
BASO%: 0.2 % (ref 0.0–2.0)
EOS%: 0.9 % (ref 0.0–7.0)
Eosinophils Absolute: 0.1 10*3/uL (ref 0.0–0.5)
HCT: 37.6 % (ref 34.8–46.6)
HGB: 12.2 g/dL (ref 11.6–15.9)
LYMPH#: 3.6 10*3/uL — ABNORMAL HIGH (ref 0.9–3.3)
LYMPH%: 43.2 % (ref 14.0–48.0)
MCH: 23.2 pg — ABNORMAL LOW (ref 26.0–34.0)
MCHC: 32.4 g/dL (ref 32.0–36.0)
MCV: 72 fL — ABNORMAL LOW (ref 81–101)
MONO#: 0.6 10*3/uL (ref 0.1–0.9)
MONO%: 7.5 % (ref 0.0–13.0)
NEUT#: 4 10*3/uL (ref 1.5–6.5)
NEUT%: 48.2 % (ref 39.6–80.0)
Platelets: 266 10*3/uL (ref 145–400)
RBC: 5.26 10*6/uL (ref 3.70–5.32)
RDW: 14.7 % (ref 11.1–15.7)
WBC: 8.2 10*3/uL (ref 3.9–10.0)

## 2011-05-16 LAB — CHCC SATELLITE - SMEAR

## 2011-05-16 NOTE — Progress Notes (Signed)
This office note has been dictated.

## 2011-05-21 LAB — IRON AND TIBC
%SAT: 31 % (ref 20–55)
Iron: 97 ug/dL (ref 42–145)
TIBC: 316 ug/dL (ref 250–470)
UIBC: 219 ug/dL (ref 125–400)

## 2011-05-21 LAB — RETICULOCYTES (CHCC)
ABS Retic: 48.7 10*3/uL (ref 19.0–186.0)
RBC.: 5.41 MIL/uL — ABNORMAL HIGH (ref 3.87–5.11)
Retic Ct Pct: 0.9 % (ref 0.4–2.3)

## 2011-05-21 LAB — HEMOGLOBINOPATHY EVALUATION
Hemoglobin Other: 0 %
Hgb A2 Quant: 2.4 % (ref 2.2–3.2)
Hgb A: 97.6 % (ref 96.8–97.8)
Hgb F Quant: 0 % (ref 0.0–2.0)
Hgb S Quant: 0 %

## 2011-05-21 LAB — FERRITIN: Ferritin: 632 ng/mL — ABNORMAL HIGH (ref 10–291)

## 2011-05-21 NOTE — Progress Notes (Signed)
CC:   Marguarite Arbour, MD  DIAGNOSIS:  Recurrent iron-deficiency anemia.  CURRENT THERAPY:  IV iron as indicated.  INTERVAL HISTORY:  Ms. Burkes comes in for followup.  She is doing a little bit better.  She got iron back in October.  She got a dose of Feraheme of 510 mg.  Unfortunately, she has a grandchild down in [place] who was diagnosed lymphoma.  He is being treated, I think, at [facility].  He started chemotherapy.  Ms. Kepple, herself, has not had any other issues outside of abdominal problems.  She has occasional spasms.  She says that her doctor called in some medicine for her.  She is not having any change in her constipation.  There has been no melena.  PHYSICAL EXAMINATION:  General Appearance:  This is a well-developed, well-nourished, black female in no obvious distress.  Vital Signs: 97.5, pulse 71, respiratory rate 20, blood pressure 128/80.  Weight is 219.  Head and Neck Exam:  A normocephalic, atraumatic skull.  There are no ocular or oral lesions.  No palpable cervical or supraclavicular lymph nodes.  Lungs:  Clear bilaterally.  Cardiac Exam:  Regular rate and rhythm with a normal S1 and S2.  There are no murmurs or rubs, or bruits.  Abdominal Exam:  Soft with good bowel sounds.  There is no palpable abdominal mass.  There is no palpable hepatosplenomegaly. Extremities:  No clubbing, cyanosis, or edema.  Neurological Exam:  No focal neurological deficits.  LABORATORY STUDIES:  White cell count is 8.2, hemoglobin 12.2, hematocrit 37.6, platelet count 266.  MCV is 72.  IMPRESSION:  Ms. Dabbs is a 50 year old, African female with a history of recurrent iron-deficiency anemia.  She has had workup so far.  Again, there is no obvious gastrointestinal issues that we can find.  Hopefully, her iron levels are okay.  Her hemoglobin certainly is doing quite well.  I think we could probably get her back now in about 3 months.  I will get her back in early  February and see how things are going with her blood work at that point in time.  We will definitely stay strong in prayer for her and her family with respect to her grandson.    ______________________________ Josph Macho, M.D. PRE/MEDQ  D:  05/16/2011  T:  05/16/2011  Job:  [id number]

## 2011-06-11 ENCOUNTER — Telehealth: Payer: Self-pay | Admitting: *Deleted

## 2011-06-11 NOTE — Telephone Encounter (Signed)
Received message from pt requesting a refill of her muscle relaxer and wanting an alternative for Tramadol as she stated it is not helping her any longer. Please advise.

## 2011-06-12 MED ORDER — CYCLOBENZAPRINE HCL 5 MG PO TABS: 5.0000 mg | ORAL_TABLET | Freq: Three times a day (TID) | ORAL | Status: AC | PRN

## 2011-06-12 NOTE — Telephone Encounter (Signed)
Notified pt. States she did have epidural and will contact them for further instructions. She reports that she has had dizziness since the weekend, some blurred vision, some weakness in her left arm. Pt denies headache, nausea, chest pain or slurred speech.  Scheduled pt appt for tomorrow at 10:30AM. Please advised if there are any other instructions.

## 2011-06-12 NOTE — Telephone Encounter (Signed)
I recommend that she follow up with [facility]. They recommended an epidural steroid injection.  Did she follow through with this?  Continue tramadol and prn flexeril.

## 2011-06-12 NOTE — Telephone Encounter (Signed)
I recommend that pt be evaluated in the ED for possible stroke.

## 2011-06-12 NOTE — Telephone Encounter (Signed)
Notified pt and cancelled appt for tomorrow.

## 2011-06-13 ENCOUNTER — Ambulatory Visit: Payer: Medicare Other | Admitting: Family

## 2011-06-25 ENCOUNTER — Telehealth: Payer: Self-pay | Admitting: *Deleted

## 2011-06-25 NOTE — Telephone Encounter (Signed)
Notiifed pt per Denise Craze, NP that she should be evaluated in the office. Appt scheduled for Wednesday at 2:15pm. Advised pt to be seen in the ER if she develops any changes with speech, cognition or motor functions. Pt voices understanding.

## 2011-06-25 NOTE — Telephone Encounter (Signed)
Pt left message that she is still having dizziness, joint pains and tingling in her hands and feet. Attempted to reach pt and left message on machine to return my call.

## 2011-06-27 ENCOUNTER — Emergency Department (INDEPENDENT_AMBULATORY_CARE_PROVIDER_SITE_OTHER): Payer: Medicare Other

## 2011-06-27 ENCOUNTER — Emergency Department (HOSPITAL_BASED_OUTPATIENT_CLINIC_OR_DEPARTMENT_OTHER)
Admission: EM | Admit: 2011-06-27 | Discharge: 2011-06-27 | Disposition: A | Discharge status: Home / Self Care | Payer: Medicare Other | Attending: Emergency Medicine | Admitting: Emergency Medicine

## 2011-06-27 ENCOUNTER — Encounter: Payer: Self-pay | Admitting: Family

## 2011-06-27 ENCOUNTER — Other Ambulatory Visit: Payer: Self-pay

## 2011-06-27 ENCOUNTER — Encounter (HOSPITAL_BASED_OUTPATIENT_CLINIC_OR_DEPARTMENT_OTHER): Payer: Self-pay | Admitting: *Deleted

## 2011-06-27 ENCOUNTER — Ambulatory Visit (INDEPENDENT_AMBULATORY_CARE_PROVIDER_SITE_OTHER): Payer: Medicare Other | Admitting: Family

## 2011-06-27 DIAGNOSIS — R079 Chest pain, unspecified: Secondary | ICD-10-CM | POA: Insufficient documentation

## 2011-06-27 DIAGNOSIS — E119 Type 2 diabetes mellitus without complications: Secondary | ICD-10-CM | POA: Insufficient documentation

## 2011-06-27 DIAGNOSIS — R42 Dizziness and giddiness: Secondary | ICD-10-CM

## 2011-06-27 DIAGNOSIS — E785 Hyperlipidemia, unspecified: Secondary | ICD-10-CM | POA: Insufficient documentation

## 2011-06-27 DIAGNOSIS — I10 Essential (primary) hypertension: Secondary | ICD-10-CM

## 2011-06-27 LAB — URINE MICROSCOPIC-ADD ON

## 2011-06-27 LAB — URINALYSIS, ROUTINE W REFLEX MICROSCOPIC
Bilirubin Urine: NEGATIVE
Glucose, UA: NEGATIVE mg/dL
Ketones, ur: NEGATIVE mg/dL
Nitrite: NEGATIVE
Protein, ur: NEGATIVE mg/dL
Specific Gravity, Urine: 1.022 (ref 1.005–1.030)
Urobilinogen, UA: 0.2 mg/dL (ref 0.0–1.0)
pH: 5.5 (ref 5.0–8.0)

## 2011-06-27 LAB — BASIC METABOLIC PANEL
BUN: 11 mg/dL (ref 6–23)
CO2: 26 mEq/L (ref 19–32)
Calcium: 8.9 mg/dL (ref 8.4–10.5)
Chloride: 103 mEq/L (ref 96–112)
Creatinine, Ser: 0.8 mg/dL (ref 0.50–1.10)
GFR calc Af Amer: >90 mL/min (ref >90)
GFR calc non Af Amer: 83 mL/min — ABNORMAL LOW (ref >90)
Glucose, Bld: 96 mg/dL (ref 70–99)
Potassium: 3.5 mEq/L (ref 3.5–5.1)
Sodium: 140 mEq/L (ref 135–145)

## 2011-06-27 LAB — CBC
HCT: 34.2 % — ABNORMAL LOW (ref 36.0–46.0)
Hemoglobin: 10.6 g/dL — ABNORMAL LOW (ref 12.0–15.0)
MCH: 22.2 pg — ABNORMAL LOW (ref 26.0–34.0)
MCHC: 31 g/dL (ref 30.0–36.0)
MCV: 71.7 fL — ABNORMAL LOW (ref 78.0–100.0)
Platelets: 214 10*3/uL (ref 150–400)
RBC: 4.77 MIL/uL (ref 3.87–5.11)
RDW: 15.3 % (ref 11.5–15.5)
WBC: 9.8 10*3/uL (ref 4.0–10.5)

## 2011-06-27 LAB — DIFFERENTIAL
Basophils Absolute: 0 10*3/uL (ref 0.0–0.1)
Basophils Relative: 0 % (ref 0–1)
Eosinophils Absolute: 0.1 10*3/uL (ref 0.0–0.7)
Eosinophils Relative: 1 % (ref 0–5)
Lymphocytes Relative: 31 % (ref 12–46)
Lymphs Abs: 3 10*3/uL (ref 0.7–4.0)
Monocytes Absolute: 0.5 10*3/uL (ref 0.1–1.0)
Monocytes Relative: 5 % (ref 3–12)
Neutro Abs: 6.2 10*3/uL (ref 1.7–7.7)
Neutrophils Relative %: 64 % (ref 43–77)

## 2011-06-27 LAB — TROPONIN I: Troponin I: <0.3 ng/mL (ref <0.30)

## 2011-06-27 MED ORDER — LORAZEPAM 1 MG PO TABS: 1.0000 mg | ORAL_TABLET | Freq: Three times a day (TID) | ORAL | Status: AC | PRN

## 2011-06-27 MED ORDER — MECLIZINE HCL 25 MG PO TABS
25.0000 mg | ORAL_TABLET | Freq: Once | ORAL | Status: AC
  Administered 2011-06-27: 25 mg via ORAL
  Filled 2011-06-27: qty 1

## 2011-06-27 NOTE — ED Notes (Signed)
Patient transported to CT via stretcher.

## 2011-06-27 NOTE — ED Notes (Signed)
Pt returned from CT °

## 2011-06-27 NOTE — Progress Notes (Signed)
Subjective:    Patient ID: [name], female    DOB: [birth year], 51 y.o.   MRN: [id number]  HPI  Ms.  Gindlesperger is a 51 yr old female who presents today with chief complaint of chest pain. Woke up with chest pain yesterday morning.  Chest pain was located left chest.  Pain is "inside" her chest.   She had some associated nausea without vomiting.  She reports associated tingling in both hands and this was followed by hands getting "real cold all of a sudden." She felt tired, but denied associated shortness of breath. Symptoms improved by nothing. Pain persists today.  She reports some sharp pains in her stomach.  + reflux symptoms.  She reports associated back ache.  She reports that she had a stress test at the [facility]. She reports that dad had an MI in his 56's.  Brother had MI in his late 56's.  Maternal GM had CABG in her 59's.      Review of Systems See HPI  Past Medical History  Diagnosis Date  . Atypical chest pain   . Hyperlipidemia   . Hypertension   . GERD (gastroesophageal reflux disease)   . Diabetes mellitus, type II   . Constipation   . Routine general medical examination at a health care facility   . Gastroparesis   . Iron deficiency anemia   . Esophageal stricture   . Migraine headache   . Endometriosis   . Cyst, ovarian   . Peptic ulcer disease   . Non-compliance   . Allergy     allergic rhinitis    History   Social History  . Marital Status: Married    Spouse Name: N/A    Number of Children: 2  . Years of Education: N/A   Occupational History  . DISABILITY    Social History Main Topics  . Smoking status: Former Smoker    Quit date: 06/25/1994  . Smokeless tobacco: Never Used   Comment: Quit in 1996  . Alcohol Use: Yes     soical drinker  . Drug Use: No  . Sexually Active: Not on file   Other Topics Concern  . Not on file   Social History Narrative   Married Has 2 grown children.  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  She was a pack a day smoker for approximately 10 years.Alcohol use-yes: Social Daily Caffeine Use:6 pack of pepsi daily  Illicit Drug Use - no Patient does not get regular exercise.    Smoking Status:  quit    Past Surgical History  Procedure Date  . Abdominal exploration surgery     w/bso   . Knee surgery 2005     left knee  . Total abdominal hysterectomy   . Cholecystectomy   . Cardiac catheterization 2009    mild non obstructive CAD    Family History  Problem Relation Age of Onset  . Hypertension Mother   . Hypertension Father   . Diabetes Father   . Prostate cancer Father   . Kidney disease Father   . Cancer Father     prostate cancer  . Diabetes Sister   . Diabetes      grandmother  . Other      no FH of colon cancer,  . Lung cancer Maternal Grandfather     Allergies  Allergen Reactions  . Ace Inhibitors     REACTION: chronic cough  . Celebrex (Celecoxib)  Other (See Comments)    "makes me bleed"    Current Outpatient Prescriptions on File Prior to Visit  Medication Sig Dispense Refill  . CARAFATE 1 GM/10ML suspension Take 2 tablespoons full four times daily.      . ergocalciferol (VITAMIN D2) 50000 UNITS capsule Take 1 capsule (50,000 Units total) by mouth once a week.  4 capsule  2  . magnesium citrate 1.745 GM/30ML SOLN Take 296 mLs by mouth once. As needed       . metroNIDAZOLE (FLAGYL) 500 MG tablet Take 500 mg by mouth daily.      . polyethylene glycol powder (MIRALAX) powder Take 17 g by mouth daily. As needed       . Sodium Phosphates (FLEET ENEMA RE) Place rectally. As needed       . SUMAtriptan (IMITREX) 50 MG tablet Take one tablet as needed for migraine.  May repeat once two hours later if needed.  10 tablet  0  . traMADol (ULTRAM) 50 MG tablet Take 1 tab every 4-6 hours as needed for pain.  30 tablet  0  . valsartan-hydrochlorothiazide (DIOVAN-HCT) 160-12.5 MG per tablet Take 1 tablet by mouth daily.  30 tablet  3    BP  154/88  Pulse 65  Temp(Src) 98.7 F (37.1 C) (Oral)  Resp 16  Wt 227 lb 1.3 oz (103.003 kg)  SpO2 100%       Objective:   Physical Exam        Assessment & Plan:

## 2011-06-27 NOTE — ED Provider Notes (Signed)
History     CSN: [id number]  Arrival date & time 06/27/11  1531   First MD Initiated Contact with Patient 06/27/11 1557      Chief Complaint  Patient presents with  . Chest Pain    (Consider location/radiation/quality/duration/timing/severity/associated sxs/prior treatment) Patient is a 51 y.o. female presenting with chest pain. The history is provided by the patient.  Chest Pain    she has been having a dull, pressure feeling across her chest since last night. He pain has been constant and nothing affects you. Inches onto the affected by laying down, sitting up, or walking. Pain does not radiate. She rates the pain at 6/10 and states that it is moderate in intensity. She has had some mild dyspnea, but no diaphoresis. She has not had pain like this before. She has also been having problems with episodic had dizziness and numbness in her hands and feet. These have been occurring since December 17 and will occur in episodes which last 20-30 minutes. Dizziness is described as like being drunk and off balance and is associated with nausea. She denies headache or hearing loss tinnitus. She has been off balance, but she has not fallen. Last night she had a more severe episode but in general, there has been no progression of the severity of the dizzy spells. She saw her PCP today who did an ECG and told her to come to the emergency department immediately.  Past Medical History  Diagnosis Date  . Atypical chest pain   . Hyperlipidemia   . Hypertension   . GERD (gastroesophageal reflux disease)   . Diabetes mellitus, type II   . Constipation   . Routine general medical examination at a health care facility   . Gastroparesis   . Iron deficiency anemia   . Esophageal stricture   . Migraine headache   . Endometriosis   . Cyst, ovarian   . Peptic ulcer disease   . Non-compliance   . Allergy     allergic rhinitis    Past Surgical History  Procedure Date  . Abdominal exploration surgery       w/bso   . Knee surgery 2005     left knee  . Total abdominal hysterectomy   . Cholecystectomy   . Cardiac catheterization 2009    mild non obstructive CAD    Family History  Problem Relation Age of Onset  . Hypertension Mother   . Hypertension Father   . Diabetes Father   . Prostate cancer Father   . Kidney disease Father   . Cancer Father     prostate cancer  . Diabetes Sister   . Diabetes      grandmother  . Other      no FH of colon cancer,  . Lung cancer Maternal Grandfather     History  Substance Use Topics  . Smoking status: Former Smoker    Quit date: 06/25/1994  . Smokeless tobacco: Never Used   Comment: Quit in 1996  . Alcohol Use: Yes     soical drinker    OB History    Grav Para Term Preterm Abortions TAB SAB Ect Mult Living                  Review of Systems  Cardiovascular: Positive for chest pain.  All other systems reviewed and are negative.    Allergies  Ace inhibitors and Celebrex  Home Medications   Current Outpatient Rx  Name Route Sig Dispense Refill  .  CARAFATE 1 GM/10ML PO SUSP  Take 2 tablespoons full four times daily.    . CYCLOBENZAPRINE HCL 5 MG PO TABS Oral Take 5 mg by mouth Three times a day.    Marland Kitchen DICYCLOMINE HCL 10 MG PO CAPS Oral Take 10 mg by mouth 3 (three) times daily as needed. For pain     . ERGOCALCIFEROL 50000 UNITS PO CAPS Oral Take 1 capsule (50,000 Units total) by mouth once a week. 4 capsule 2  . NAPROXEN SODIUM 220 MG PO TABS Oral Take 220 mg by mouth 2 (two) times daily with a meal.      . POLYETHYLENE GLYCOL 3350 PO POWD Oral Take 17 g by mouth daily. For indigestion    . SUMATRIPTAN SUCCINATE 50 MG PO TABS  Take one tablet as needed for migraine.  May repeat once two hours later if needed. 10 tablet 0  . TRAMADOL HCL 50 MG PO TABS  Take 1 tab every 4-6 hours as needed for pain. 30 tablet 0  . VALSARTAN-HYDROCHLOROTHIAZIDE 160-12.5 MG PO TABS Oral Take 1 tablet by mouth daily. 30 tablet 3    BP 175/94   Pulse 65  Temp(Src) 98.1 F (36.7 C) (Oral)  Resp 20  Ht 5\' 4"  (1.626 m)  Wt 224 lb (101.606 kg)  BMI 38.45 kg/m2  SpO2 100%  Physical Exam  Nursing note and vitals reviewed.  51 year old female is resting comfortably and in no acute distress. Vital signs significant for mild hypertension with blood pressure 150 radiate. Oxygen saturation is 100% which is normal. The LA, EOMI. There is slight nystagmus noted on left lateral gaze. Fundi show no hemorrhage or exudate. Head is normocephalic and atraumatic. Oropharynx is clear. Neck is supple without adenopathy, JVD, or bruit. Back is nontender. Lungs are clear without rales, wheezes, rhonchi. Heart has regular rate and rhythm without murmur. Abdomen is soft, flat, nontender without masses or hepatosplenomegaly. Extremities have 1+ edema, no cyanosis. For range of motion is present. Skin she has areas of vitiligo on her hands and also some verrucous lesions across her distal forearms and hands which apparently are chronic. No rashes seen. Neurologic: Mental status is normal, cranial nerves are intact. There no focal motor or sensory deficits. Romberg testing was significant for mild to moderate unsteadiness but she did not tend to fall in any one direction. Gait appeared normal although she stated she was unsteady. She was significantly unsteady on tandem gait walking, but again did not fall in any one direction consistently and she was able to complete the maneuver. Psychiatric: No abnormalities of mood or affect.  ED Course  Procedures (including critical care time)  Labs Reviewed - No data to display No results found. Results for orders placed during the hospital encounter of 06/27/11  CBC      Component Value Range   WBC 9.8  4.0 - 10.5 (K/uL)   RBC 4.77  3.87 - 5.11 (MIL/uL)   Hemoglobin 10.6 (*) 12.0 - 15.0 (g/dL)   HCT 78.2 (*) 95.6 - 46.0 (%)   MCV 71.7 (*) 78.0 - 100.0 (fL)   MCH 22.2 (*) 26.0 - 34.0 (pg)   MCHC 31.0  30.0 - 36.0  (g/dL)   RDW 21.3  08.6 - 57.8 (%)   Platelets 214  150 - 400 (K/uL)  DIFFERENTIAL      Component Value Range   Neutrophils Relative 64  43 - 77 (%)   Neutro Abs 6.2  1.7 - 7.7 (K/uL)  Lymphocytes Relative 31  12 - 46 (%)   Lymphs Abs 3.0  0.7 - 4.0 (K/uL)   Monocytes Relative 5  3 - 12 (%)   Monocytes Absolute 0.5  0.1 - 1.0 (K/uL)   Eosinophils Relative 1  0 - 5 (%)   Eosinophils Absolute 0.1  0.0 - 0.7 (K/uL)   Basophils Relative 0  0 - 1 (%)   Basophils Absolute 0.0  0.0 - 0.1 (K/uL)  BASIC METABOLIC PANEL      Component Value Range   Sodium 140  135 - 145 (mEq/L)   Potassium 3.5  3.5 - 5.1 (mEq/L)   Chloride 103  96 - 112 (mEq/L)   CO2 26  19 - 32 (mEq/L)   Glucose, Bld 96  70 - 99 (mg/dL)   BUN 11  6 - 23 (mg/dL)   Creatinine, Ser 1.61  0.50 - 1.10 (mg/dL)   Calcium 8.9  8.4 - 09.6 (mg/dL)   GFR calc non Af Amer 83 (*) >90 (mL/min)   GFR calc Af Amer >90  >90 (mL/min)  TROPONIN I      Component Value Range   Troponin I <0.30  <0.30 (ng/mL)  URINALYSIS, ROUTINE W REFLEX MICROSCOPIC      Component Value Range   Color, Urine YELLOW  YELLOW    APPearance CLOUDY (*) CLEAR    Specific Gravity, Urine 1.022  1.005 - 1.030    pH 5.5  5.0 - 8.0    Glucose, UA NEGATIVE  NEGATIVE (mg/dL)   Hgb urine dipstick TRACE (*) NEGATIVE    Bilirubin Urine NEGATIVE  NEGATIVE    Ketones, ur NEGATIVE  NEGATIVE (mg/dL)   Protein, ur NEGATIVE  NEGATIVE (mg/dL)   Urobilinogen, UA 0.2  0.0 - 1.0 (mg/dL)   Nitrite NEGATIVE  NEGATIVE    Leukocytes, UA MODERATE (*) NEGATIVE   URINE MICROSCOPIC-ADD ON      Component Value Range   Squamous Epithelial / LPF FEW (*) RARE    WBC, UA 7-10  <3 (WBC/hpf)   RBC / HPF 0-2  <3 (RBC/hpf)   Bacteria, UA FEW (*) RARE    Ct Head Wo Contrast  06/27/2011  *RADIOLOGY REPORT*  Clinical Data: Vertigo with dizziness for 2 weeks.  History of migraine headaches.  CT HEAD WITHOUT CONTRAST  Technique:  Contiguous axial images were obtained from the base of the  skull through the vertex without contrast.  Comparison: None available - report from prior study 09/27/1999.  Findings: There is no evidence of acute intracranial hemorrhage, mass lesion, brain edema or extra-axial fluid collection.  The ventricles and subarachnoid spaces are appropriately sized for age. There is no CT evidence of acute cortical infarction.  The visualized paranasal sinuses are clear. There are postsurgical changes status post partial right mastoidectomy.  The middle ears are clear.  The calvarium is intact.  IMPRESSION: Normal appearance of the brain.  Postsurgical changes status post right mastoidectomy.  Original Report Authenticated By: Gerrianne Scale, M.D.      No diagnosis found.   Date: 06/27/2011  Rate: 62  Rhythm: normal sinus rhythm  QRS Axis: normal  Intervals: normal  ST/T Wave abnormalities: normal  Conduction Disutrbances:none  Narrative Interpretation: Poor R-wave across the precordium, otherwise normal ECG. When compared with ECG of 09/14/2009, no significant changes are seen.  Old EKG Reviewed: none available and unchanged  She was given a dose of meclizine with no subjective improvement. MRI scan is not available at this location today.  With a negative CT scan, and symptoms present for more than 2 weeks, I do not feel she needs an emergent MRI scan. However, MRI scan as an outpatient would be advisable. This was explained to the patient. Chest pain does not appear to be cardiac with normal ECG and negative cardiac markers. She will be given a trial of Ativan for her vertigo. She is also referred to [facility] for further evaluation.  MDM  Chest pain of uncertain etiology, but steady pain for 18 hours and no ECG changes would argue against cardiac etiology. Episodes of vertigo which may be paroxysmal vertigo but need to rule out tumor or stroke. She was not given aspirin because she states she is allergic to because of GI  bleeding.        Dione Booze, MD 06/27/11 1754

## 2011-06-27 NOTE — Assessment & Plan Note (Addendum)
I have personally reviewed her EKG and compared it to her EKG performed on 11/01/09 and 06/27/11.  She is noted to have a new ST elevation in V2 and V3. She has multiple risk factors for CAD: strong family hx, DM2, obesity, HTN, Hyperlipidemia.  I have advised her to be evaluated in the ED downstairs.   .  Pt is clinically stable and agreeable to go to the ED. Report given to [facility] triage RN. Pt was brought to ED via wheelchair.

## 2011-06-27 NOTE — ED Notes (Signed)
Attempted IV access x 1 - unsuccessful.

## 2011-06-27 NOTE — ED Notes (Signed)
Pt ambulatory to BR for urine specimen. 

## 2011-07-02 ENCOUNTER — Other Ambulatory Visit: Payer: BC Managed Care – PPO

## 2011-07-30 ENCOUNTER — Encounter: Payer: Self-pay | Admitting: Family

## 2011-07-30 ENCOUNTER — Telehealth: Payer: Self-pay | Admitting: Family

## 2011-07-30 ENCOUNTER — Ambulatory Visit (HOSPITAL_BASED_OUTPATIENT_CLINIC_OR_DEPARTMENT_OTHER): Payer: Medicare Other | Admitting: Hematology & Oncology

## 2011-07-30 ENCOUNTER — Other Ambulatory Visit (HOSPITAL_BASED_OUTPATIENT_CLINIC_OR_DEPARTMENT_OTHER): Payer: Medicare Other | Admitting: Lab

## 2011-07-30 ENCOUNTER — Ambulatory Visit (INDEPENDENT_AMBULATORY_CARE_PROVIDER_SITE_OTHER): Payer: Medicare Other | Admitting: Family

## 2011-07-30 VITALS — BP 127/84 | HR 74 | Temp 98.6°F | Ht 64.0 in | Wt 223.0 lb

## 2011-07-30 VITALS — BP 126/80 | HR 66 | Temp 98.1°F | Resp 18 | Ht 64.0 in | Wt 222.1 lb

## 2011-07-30 DIAGNOSIS — I10 Essential (primary) hypertension: Secondary | ICD-10-CM

## 2011-07-30 DIAGNOSIS — D509 Iron deficiency anemia, unspecified: Secondary | ICD-10-CM

## 2011-07-30 DIAGNOSIS — R0789 Other chest pain: Secondary | ICD-10-CM

## 2011-07-30 DIAGNOSIS — R079 Chest pain, unspecified: Secondary | ICD-10-CM

## 2011-07-30 DIAGNOSIS — R7309 Other abnormal glucose: Secondary | ICD-10-CM

## 2011-07-30 DIAGNOSIS — K5900 Constipation, unspecified: Secondary | ICD-10-CM

## 2011-07-30 DIAGNOSIS — R7303 Prediabetes: Secondary | ICD-10-CM

## 2011-07-30 LAB — IRON AND TIBC
%SAT: 17 % — ABNORMAL LOW (ref 20–55)
Iron: 62 ug/dL (ref 42–145)
TIBC: 362 ug/dL (ref 250–470)
UIBC: 300 ug/dL (ref 125–400)

## 2011-07-30 LAB — CBC WITH DIFFERENTIAL (CANCER CENTER ONLY)
BASO#: 0 10*3/uL (ref 0.0–0.2)
BASO%: 0.2 % (ref 0.0–2.0)
EOS%: 0.6 % (ref 0.0–7.0)
Eosinophils Absolute: 0.1 10*3/uL (ref 0.0–0.5)
HCT: 35.7 % (ref 34.8–46.6)
HGB: 11.1 g/dL — ABNORMAL LOW (ref 11.6–15.9)
LYMPH#: 3 10*3/uL (ref 0.9–3.3)
LYMPH%: 30.8 % (ref 14.0–48.0)
MCH: 23 pg — ABNORMAL LOW (ref 26.0–34.0)
MCHC: 31.1 g/dL — ABNORMAL LOW (ref 32.0–36.0)
MCV: 74 fL — ABNORMAL LOW (ref 81–101)
MONO#: 0.4 10*3/uL (ref 0.1–0.9)
MONO%: 4.5 % (ref 0.0–13.0)
NEUT#: 6.1 10*3/uL (ref 1.5–6.5)
NEUT%: 63.9 % (ref 39.6–80.0)
Platelets: 203 10*3/uL (ref 145–400)
RBC: 4.83 10*6/uL (ref 3.70–5.32)
RDW: 15.7 % (ref 11.1–15.7)
WBC: 9.6 10*3/uL (ref 3.9–10.0)

## 2011-07-30 LAB — RETICULOCYTES (CHCC)
ABS Retic: 58.7 10*3/uL (ref 19.0–186.0)
RBC.: 4.89 MIL/uL (ref 3.87–5.11)
Retic Ct Pct: 1.2 % (ref 0.4–2.3)

## 2011-07-30 LAB — FERRITIN: Ferritin: 567 ng/mL — ABNORMAL HIGH (ref 10–291)

## 2011-07-30 LAB — HEMOGLOBIN A1C
Hgb A1c MFr Bld: 6.2 % — ABNORMAL HIGH (ref <5.7)
Mean Plasma Glucose: 131 mg/dL — ABNORMAL HIGH (ref <117)

## 2011-07-30 LAB — CHCC SATELLITE - SMEAR

## 2011-07-30 NOTE — Patient Instructions (Signed)
Please complete your blood work prior to leaving today.  Follow up in 3 months.  

## 2011-07-30 NOTE — Assessment & Plan Note (Signed)
Obtain A1C. 

## 2011-07-30 NOTE — Progress Notes (Signed)
This office note has been dictated.

## 2011-07-30 NOTE — Telephone Encounter (Signed)
Please call pt and let her know that I would like to refer her for a stress test to complete her chest pain evaluation.  I have pended order.

## 2011-07-30 NOTE — Assessment & Plan Note (Signed)
Improved, this is being managed by Dr. Myna Hidalgo.

## 2011-07-30 NOTE — Assessment & Plan Note (Signed)
BP Readings from Last 3 Encounters:  07/30/11 126/80  07/30/11 127/84  06/27/11 191/89   BP looks good on diovan HCT.  BMET was stable earlier this month. Continue same.

## 2011-07-30 NOTE — Progress Notes (Signed)
Subjective:    Patient ID: [name], female    DOB: 30-Jun-1960, 51 y.o.   MRN: [id number]  HPI  [name] is a 51 yr old female who presents today for follow up.  HTN- She reports tolerating BP meds without problem. Denies swelling.  She is on diovan hct.  BP looks good.    DM2- borderline- she has not had an A1C drawn since 5/11.    Iron Deficiency anemia- this is being managed by Dr. Myna Hidalgo.  Had CBC drawn this AM and it came up to 11.       Review of Systems See HPI  Past Medical History  Diagnosis Date  . Atypical chest pain   . Hyperlipidemia   . Hypertension   . GERD (gastroesophageal reflux disease)   . Diabetes mellitus, type II   . Constipation   . Routine general medical examination at a health care facility   . Gastroparesis   . Iron deficiency anemia   . Esophageal stricture   . Migraine headache   . Endometriosis   . Cyst, ovarian   . Peptic ulcer disease   . Non-compliance   . Allergy     allergic rhinitis    History   Social History  . Marital Status: Married    Spouse Name: N/A    Number of Children: 2  . Years of Education: N/A   Occupational History  . DISABILITY    Social History Main Topics  . Smoking status: Former Smoker    Quit date: 06/25/1994  . Smokeless tobacco: Never Used   Comment: Quit in 1996  . Alcohol Use: Yes     soical drinker  . Drug Use: No  . Sexually Active: Not on file   Other Topics Concern  . Not on file   Social History Narrative   Married Has 2 grown children.  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  She was a pack a day smoker for approximately 10 years.Alcohol use-yes: Social Daily Caffeine Use:6 pack of pepsi daily  Illicit Drug Use - no Patient does not get regular exercise.    Smoking Status:  quit    Past Surgical History  Procedure Date  . Abdominal exploration surgery     w/bso   . Knee surgery 2005     left knee  . Total abdominal hysterectomy    . Cholecystectomy   . Cardiac catheterization 2009    mild non obstructive CAD    Family History  Problem Relation Age of Onset  . Hypertension Mother   . Hypertension Father   . Diabetes Father   . Prostate cancer Father   . Kidney disease Father   . Cancer Father     prostate cancer  . Diabetes Sister   . Diabetes      grandmother  . Other      no FH of colon cancer,  . Lung cancer Maternal Grandfather     Allergies  Allergen Reactions  . Ace Inhibitors     REACTION: chronic cough  . Celebrex (Celecoxib) Other (See Comments)    "makes me bleed"    Current Outpatient Prescriptions on File Prior to Visit  Medication Sig Dispense Refill  . CARAFATE 1 GM/10ML suspension Take 2 tablespoons full four times daily.      . cyclobenzaprine (FLEXERIL) 5 MG tablet Take 5 mg by mouth Three times a day.      . dicyclomine (BENTYL)  10 MG capsule Take 10 mg by mouth 3 (three) times daily as needed. For pain       . ergocalciferol (VITAMIN D2) 50000 UNITS capsule Take 1 capsule (50,000 Units total) by mouth once a week.  4 capsule  2  . naproxen sodium (ANAPROX) 220 MG tablet Take 220 mg by mouth 2 (two) times daily with a meal.        . polyethylene glycol powder (MIRALAX) powder Take 17 g by mouth daily. For indigestion      . SUMAtriptan (IMITREX) 50 MG tablet Take one tablet as needed for migraine.  May repeat once two hours later if needed.  10 tablet  0  . traMADol (ULTRAM) 50 MG tablet Take 1 tab every 4-6 hours as needed for pain.  30 tablet  0  . valsartan-hydrochlorothiazide (DIOVAN-HCT) 160-12.5 MG per tablet Take 1 tablet by mouth daily.  30 tablet  3    BP 126/80  Pulse 66  Temp(Src) 98.1 F (36.7 C) (Oral)  Resp 18  Ht 5\' 4"  (1.626 m)  Wt 222 lb 1.9 oz (100.753 kg)  BMI 38.13 kg/m2  SpO2 99%       Objective:   Physical Exam  Constitutional: She is oriented to person, place, and time. She appears well-developed and well-nourished. No distress.  Neck: Neck  supple.  Cardiovascular: Normal rate and regular rhythm.   No murmur heard. Pulmonary/Chest: Effort normal and breath sounds normal. No respiratory distress. She has no wheezes. She has no rales. She exhibits no tenderness.  Musculoskeletal: She exhibits no edema.  Neurological: She is alert and oriented to person, place, and time.  Psychiatric: She has a normal mood and affect. Her behavior is normal. Judgment and thought content normal.          Assessment & Plan:

## 2011-07-30 NOTE — Assessment & Plan Note (Signed)
Hx of atypical CP- seen in ED earlier this month and ruled out for MI.  Will refer for stress test.

## 2011-07-31 ENCOUNTER — Encounter: Payer: Self-pay | Admitting: Family

## 2011-07-31 NOTE — Progress Notes (Signed)
CC:   Denise Arbour, MD  DIAGNOSIS:  Recurrent iron deficiency anemia.  CURRENT THERAPY:  IV iron as indicated.  INTERIM HISTORY:  [name] comes in for her followup.  She feels tired again.  She last got iron back in October.  At that in point time, her blood work that we did back in November showed her hemoglobin to be 12.2.  Her grandchild down in [place],  who has lymphoma is doing okay.  He is taking chemotherapy.  Chemotherapy appears to be working.  She is having constipation issues. She is going to be seeing a doctor at [facility] for possibility of Botox injections.  She has had no obvious bleeding. There has been no cough.  She has had no fever. There has been some arthralgias which have been chronic.  PHYSICAL EXAM:  General: This is a mildly obese black female in no obvious distress.  Vital signs: Temperature 97.6, pulse 74, respiratory rate 18, blood pressure 147/84, and weight is 223.  Head and neck exam shows a normocephalic, atraumatic skull.  There are no ocular or oral lesions.  There are no palpable cervical or supraclavicular lymph nodes. Lungs are clear to percussion and auscultation bilaterally.  Cardiac exam: Regular rate and rhythm with a normal S1 and S2.  No murmurs, rubs, or bruits.  Abdominal exam: Soft with good bowel sounds.  There is no palpable abdominal mass.  There is no palpable hepatosplenomegaly. Back exam:  No tenderness of the spine, ribs, or hips.  Extremities: Shows no clubbing, cyanosis, or edema.  Neurological exam: Shows no focal neurological deficit.  Skin exam: Shows a chronic papular-type of rash on her extremities.  LABORATORY STUDIES:  White cell count 9.6, hemoglobin 11.1, hematocrit 35.7, and platelet count 203.  MCV is 74.  IMPRESSION:  [name] is a 51 year old black female with recurrent iron deficiency anemia.  She has had thorough workup to date.  There is no obvious source for the iron deficiency. Undoubtedly, she just  does not absorb iron well.  We will see what her iron studies are.  Her ferritin usually is elevated.  I typically go by her iron saturation as to whether not she needs IV iron.  I want to see back in another 3 months or so for followup.    ______________________________ Josph Macho, M.D. PRE/MEDQ  D:  07/30/2011  T:  07/31/2011  Job:  [id number]

## 2011-07-31 NOTE — Telephone Encounter (Signed)
Pt notified and agreeable to proceed. Order signed. Advised pt to let us know if she does not hear from [place] re: referral by next week.

## 2011-08-15 ENCOUNTER — Ambulatory Visit (INDEPENDENT_AMBULATORY_CARE_PROVIDER_SITE_OTHER): Payer: Medicare Other | Admitting: Physician Assistant

## 2011-08-15 DIAGNOSIS — R0789 Other chest pain: Secondary | ICD-10-CM

## 2011-08-15 NOTE — Progress Notes (Signed)
   [name] is a 51 y.o. female with DM2, HTN, HLP who had minimal LAD plaque on a cath in 2009 seen by her PCP recently after a trip to the ED with atypical chest pain.  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  She was seen by Dr. Verne Carrow in 2010.  Exam unremarkable.    Exercise Treadmill Test  Pre-Exercise Testing Evaluation Rhythm: normal sinus  Rate: 90   PR:  .16 QRS:  .09  QT:  .38 QTc: 46     Test  Exercise Tolerance Test Ordering MD: Sandford Craze MD  Interpreting MD:  Tereso Newcomer PA-C  Unique Test No: 1  Treadmill:  1  Indication for ETT: chest pain - rule out ischemia  Contraindication to ETT: No   Stress Modality: exercise - treadmill  Cardiac Imaging Performed: non   Protocol: standard Bruce - maximal  Max BP: 220/93  Max MPHR (bpm):  168 85% MPR (bpm):  143  MPHR obtained (bpm):  150 % MPHR obtained:  89%  Reached 85% MPHR (min:sec):  4:56 Total Exercise Time (min-sec):  6:00  Workload in METS:  7.0 Borg Scale: 15  Reason ETT Terminated:  exaggerated hypertensive response     ST Segment Analysis At Rest: normal ST segments - no evidence of significant ST depression With Exercise: non-specific ST changes  Other Information Arrhythmia:  No Angina during ETT:  present (1) Quality of ETT:  diagnostic  ETT Interpretation:  normal - no evidence of ischemia by ST analysis  Comments: Fair exercise tolerance. There was chest pain at peak exercise. Baseline BP was 165/99. Hypertensive BP response to exercise. No ST-T changes to suggest ischemia.  Recommendations: Follow up with PCP for further BP management. Tereso Newcomer, PA-C  11:49 AM 08/15/2011

## 2011-08-17 ENCOUNTER — Telehealth: Payer: Self-pay | Admitting: Family

## 2011-08-17 NOTE — Telephone Encounter (Signed)
Pls call pt and let her know that her stress test was normal.  Her BP was very high at cardiology.  I would like to see her back in the office please to re-evaluate her BP pls.

## 2011-08-20 NOTE — Telephone Encounter (Signed)
Notified pt and scheduled f/u for tomorrow at 2:30pm.

## 2011-08-21 ENCOUNTER — Ambulatory Visit: Payer: Medicare Other | Admitting: Family

## 2011-08-24 ENCOUNTER — Ambulatory Visit (INDEPENDENT_AMBULATORY_CARE_PROVIDER_SITE_OTHER): Payer: Medicare Other | Admitting: Family

## 2011-08-24 ENCOUNTER — Encounter: Payer: Self-pay | Admitting: Family

## 2011-08-24 VITALS — BP 144/94 | HR 93 | Temp 98.2°F | Resp 16 | Wt 225.0 lb

## 2011-08-24 DIAGNOSIS — I10 Essential (primary) hypertension: Secondary | ICD-10-CM

## 2011-08-24 NOTE — Progress Notes (Signed)
Subjective:    Patient ID: [name], female    DOB: 06/30/1960, 51 y.o.   MRN: [id number]  HPI  Ms.  Bouvier is a 51 yr old female who presents today for follow up of her HTN.  She recently had a stress test performed over at cardiology which was normal, but she was noted to have elevated blood pressure.  It was recommended that she follow up with [place] for re-evaluation of her pressure.  She currently maintained on valsartan/hydrochlorothiazide.  She reports home blood pressure readings are stable.  She reports mild shortness of breath and chest pain which is unchanged.    Review of Systems See HPI  Past Medical History  Diagnosis Date  . Atypical chest pain   . Hyperlipidemia   . Hypertension   . GERD (gastroesophageal reflux disease)   . Diabetes mellitus, type II   . Constipation   . Routine general medical examination at a health care facility   . Gastroparesis   . Iron deficiency anemia   . Esophageal stricture   . Migraine headache   . Endometriosis   . Cyst, ovarian   . Peptic ulcer disease   . Non-compliance   . Allergy     allergic rhinitis    History   Social History  . Marital Status: Married    Spouse Name: N/A    Number of Children: 2  . Years of Education: N/A   Occupational History  . DISABILITY    Social History Main Topics  . Smoking status: Former Smoker    Quit date: 06/25/1994  . Smokeless tobacco: Never Used   Comment: Quit in 1996  . Alcohol Use: Yes     soical drinker  . Drug Use: No  . Sexually Active: Not on file   Other Topics Concern  . Not on file   Social History Narrative   Married Has 2 grown children.  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  She was a pack a day smoker for approximately 10 years.Alcohol use-yes: Social Daily Caffeine Use:6 pack of pepsi daily  Illicit Drug Use - no Patient does not get regular exercise.    Smoking Status:  quit    Past Surgical History  Procedure Date   . Abdominal exploration surgery     w/bso   . Knee surgery 2005     left knee  . Total abdominal hysterectomy   . Cholecystectomy   . Cardiac catheterization 2009    mild non obstructive CAD    Family History  Problem Relation Age of Onset  . Hypertension Mother   . Hypertension Father   . Diabetes Father   . Prostate cancer Father   . Kidney disease Father   . Cancer Father     prostate cancer  . Diabetes Sister   . Diabetes      grandmother  . Other      no FH of colon cancer,  . Lung cancer Maternal Grandfather     Allergies  Allergen Reactions  . Ace Inhibitors     REACTION: chronic cough  . Celebrex (Celecoxib) Other (See Comments)    "makes me bleed"    Current Outpatient Prescriptions on File Prior to Visit  Medication Sig Dispense Refill  . CARAFATE 1 GM/10ML suspension Take 2 tablespoons full four times daily.      . cyclobenzaprine (FLEXERIL) 5 MG tablet Take 5 mg by mouth Three times a day.      Marland Kitchen  dicyclomine (BENTYL) 10 MG capsule Take 10 mg by mouth 3 (three) times daily as needed. For pain       . ergocalciferol (VITAMIN D2) 50000 UNITS capsule Take 1 capsule (50,000 Units total) by mouth once a week.  4 capsule  2  . LORazepam (ATIVAN) 1 MG tablet Take 1 mg by mouth At bedtime as needed.      . naproxen sodium (ANAPROX) 220 MG tablet Take 220 mg by mouth 2 (two) times daily with a meal.        . nortriptyline (PAMELOR) 10 MG capsule Take 10 mg by mouth daily.      . ondansetron (ZOFRAN-ODT) 8 MG disintegrating tablet Take 8 mg by mouth as needed.      . polyethylene glycol powder (MIRALAX) powder Take 17 g by mouth daily. For indigestion      . SUMAtriptan (IMITREX) 50 MG tablet Take one tablet as needed for migraine.  May repeat once two hours later if needed.  10 tablet  0  . traMADol (ULTRAM) 50 MG tablet Take 1 tab every 4-6 hours as needed for pain.  30 tablet  0  . valsartan-hydrochlorothiazide (DIOVAN-HCT) 160-12.5 MG per tablet Take 1 tablet by  mouth daily.  30 tablet  3    BP 144/94  Pulse 93  Temp(Src) 98.2 F (36.8 C) (Oral)  Resp 16  Wt 225 lb (102.059 kg)  SpO2 97%       Objective:   Physical Exam  Constitutional: She appears well-developed and well-nourished. No distress.  Neck: Normal range of motion. Neck supple.  Cardiovascular: Normal rate and regular rhythm.   No murmur heard. Pulmonary/Chest: Breath sounds normal. No respiratory distress. She has no wheezes. She has no rales. She exhibits no tenderness.  Musculoskeletal:       Trace bilateral LE edema  Lymphadenopathy:    She has no cervical adenopathy.  Skin: Skin is warm and dry. No rash noted. No erythema. No pallor.  Psychiatric: She has a normal mood and affect. Her behavior is normal. Judgment and thought content normal.          Assessment & Plan:

## 2011-08-24 NOTE — Patient Instructions (Signed)
Please check your blood pressure once daily for the next week.  Call us with your readings in 1 week. Follow up in 3 months.

## 2011-08-24 NOTE — Assessment & Plan Note (Addendum)
BP Readings from Last 3 Encounters:  08/24/11 144/94  07/30/11 126/80  07/30/11 127/84   Our readings have looked ok.  I have asked her to check bp once daily for 1 week and call us the readings.  Continue Diovan hct.

## 2011-09-17 NOTE — Progress Notes (Signed)
  Subjective:    Patient ID: [name], female    DOB: May 16, 1961, 51 y.o.   MRN: [id number]  HPI    Review of Systems     Objective:   Physical Exam  Constitutional: She appears well-developed and well-nourished. No distress.  Cardiovascular: Normal rate and regular rhythm.   No murmur heard. Pulmonary/Chest: Effort normal and breath sounds normal. No respiratory distress. She has no wheezes. She has no rales. She exhibits no tenderness.  Psychiatric: She has a normal mood and affect.          Assessment & Plan:

## 2011-10-01 ENCOUNTER — Ambulatory Visit (HOSPITAL_BASED_OUTPATIENT_CLINIC_OR_DEPARTMENT_OTHER): Payer: Medicare Other | Admitting: Hematology & Oncology

## 2011-10-01 ENCOUNTER — Other Ambulatory Visit: Payer: Medicare Other | Admitting: Lab

## 2011-10-01 ENCOUNTER — Other Ambulatory Visit (HOSPITAL_BASED_OUTPATIENT_CLINIC_OR_DEPARTMENT_OTHER): Payer: Medicare Other | Admitting: Lab

## 2011-10-01 ENCOUNTER — Ambulatory Visit: Payer: Medicare Other | Admitting: Hematology & Oncology

## 2011-10-01 VITALS — BP 160/95 | HR 64 | Temp 96.9°F | Ht 64.0 in | Wt 218.0 lb

## 2011-10-01 DIAGNOSIS — D509 Iron deficiency anemia, unspecified: Secondary | ICD-10-CM

## 2011-10-01 LAB — CBC WITH DIFFERENTIAL (CANCER CENTER ONLY)
BASO#: 0 10*3/uL (ref 0.0–0.2)
BASO%: 0.2 % (ref 0.0–2.0)
EOS%: 2.9 % (ref 0.0–7.0)
Eosinophils Absolute: 0.2 10*3/uL (ref 0.0–0.5)
HCT: 37.3 % (ref 34.8–46.6)
HGB: 11.7 g/dL (ref 11.6–15.9)
LYMPH#: 3.4 10*3/uL — ABNORMAL HIGH (ref 0.9–3.3)
LYMPH%: 41.1 % (ref 14.0–48.0)
MCH: 23 pg — ABNORMAL LOW (ref 26.0–34.0)
MCHC: 31.4 g/dL — ABNORMAL LOW (ref 32.0–36.0)
MCV: 73 fL — ABNORMAL LOW (ref 81–101)
MONO#: 0.5 10*3/uL (ref 0.1–0.9)
MONO%: 6.4 % (ref 0.0–13.0)
NEUT#: 4.1 10*3/uL (ref 1.5–6.5)
NEUT%: 49.4 % (ref 39.6–80.0)
Platelets: 228 10*3/uL (ref 145–400)
RBC: 5.09 10*6/uL (ref 3.70–5.32)
RDW: 15.5 % (ref 11.1–15.7)
WBC: 8.3 10*3/uL (ref 3.9–10.0)

## 2011-10-01 LAB — RETICULOCYTES (CHCC)
ABS Retic: 62.4 10*3/uL (ref 19.0–186.0)
RBC.: 5.2 MIL/uL — ABNORMAL HIGH (ref 3.87–5.11)
Retic Ct Pct: 1.2 % (ref 0.4–2.3)

## 2011-10-01 LAB — IRON AND TIBC
%SAT: 13 % — ABNORMAL LOW (ref 20–55)
Iron: 43 ug/dL (ref 42–145)
TIBC: 321 ug/dL (ref 250–470)
UIBC: 278 ug/dL (ref 125–400)

## 2011-10-01 LAB — FERRITIN: Ferritin: 443 ng/mL — ABNORMAL HIGH (ref 10–291)

## 2011-10-01 NOTE — Progress Notes (Signed)
This office note has been dictated.

## 2011-10-02 NOTE — Progress Notes (Signed)
CC:   Marguarite Arbour, MD  DIAGNOSIS:  Recurrent iron-deficiency anemia.  CURRENT THERAPY:  IV iron as indicated.  INTERIM HISTORY:  [name] comes in for her followup.  She is doing okay.  She has had no specific complaints.  She is seeing a Dr. Carolynn Sayers out at [facility].  This is because of some GI issues that she is having.  We have not had to give her iron I think since October of last year.  Her last iron studies back in February showed a ferritin of 567.  Her iron saturation was 17%.  She has had some lower rectal bleeding.  Again, she is seeing Dr. Carolynn Sayers out at [facility].  She has had no nausea or vomiting.  She has had some abdominal discomfort.  She also apparently has issues with her esophagus that he is trying to help.  There has been no fever, sweats or chills.  PHYSICAL EXAMINATION:  This is an obese black female in no obvious distress.  Vital signs:  96.9, pulse 64, respiratory rate 18, blood pressure 160/95.  Weight is 218.  Head and neck exam shows a normocephalic, atraumatic skull.  There are no ocular or oral lesions. There are no palpable cervical or supraclavicular lymph nodes.  Lungs: Clear bilaterally.  Cardiac:  Regular rate and rhythm with a normal S1 and S2.  There are no murmurs, rubs or bruits.  Abdomen:  Soft with good bowel sounds.  There is no palpable abdominal mass.  There is no fluid wave.  There is no palpable hepatosplenomegaly.  Extremities:  Show no clubbing, cyanosis or edema.  Neurologic:  Shows no focal neurological deficits.  LABORATORY STUDIES:  A white cell count of 8.3, level is 11.7, hematocrit 37.3, platelet count is 228.  MCV is 73.  IMPRESSION:  [name] is a 51 year old African American female with microcytic anemia.  She has had a history of iron deficiency.  We will see what her iron studies are.  If we need to, we certainly can give her iron.  We will plan to have [name] come back to see [place] in another couple of months  or so for followup.    ______________________________ [name], M.D. PRE/MEDQ  D:  10/01/2011  T:  10/02/2011  Job:  [id number]

## 2011-10-04 ENCOUNTER — Telehealth: Payer: Self-pay | Admitting: *Deleted

## 2011-10-04 ENCOUNTER — Ambulatory Visit (HOSPITAL_BASED_OUTPATIENT_CLINIC_OR_DEPARTMENT_OTHER): Payer: Medicare Other

## 2011-10-04 ENCOUNTER — Telehealth: Payer: Self-pay | Admitting: Family

## 2011-10-04 VITALS — BP 131/85 | HR 87 | Temp 98.0°F

## 2011-10-04 DIAGNOSIS — D509 Iron deficiency anemia, unspecified: Secondary | ICD-10-CM

## 2011-10-04 MED ORDER — FERUMOXYTOL INJECTION 510 MG/17 ML
510.0000 mg | Freq: Once | INTRAVENOUS | Status: AC
  Administered 2011-10-04: 510 mg via INTRAVENOUS
  Filled 2011-10-04: qty 17

## 2011-10-04 NOTE — Telephone Encounter (Signed)
She has not had a bm for a week.  She has been taking her meds but it is not working.  Please call her in something stronger.  [facility] [place]

## 2011-10-04 NOTE — Telephone Encounter (Signed)
Message copied by Anselm Jungling on Thu Oct 04, 2011 12:49 PM ------      Message from: Arlan Organ R      Created: Wed Oct 03, 2011  9:31 PM       Call - iron is dropping.  Need Feraheme 510 mg x 1 dose.  pete

## 2011-10-04 NOTE — Patient Instructions (Signed)
Ferumoxytol injection What is this medicine? FERUMOXYTOL is an iron complex. Iron is used to make healthy red blood cells, which carry oxygen and nutrients throughout the body. This medicine is used to treat iron deficiency anemia in people with chronic kidney disease. This medicine may be used for other purposes; ask your health care provider or pharmacist if you have questions. What should I tell my health care provider before I take this medicine? They need to know if you have any of these conditions: -anemia not caused by low iron levels -high levels of iron in the blood -magnetic resonance imaging (MRI) test scheduled -an unusual or allergic reaction to iron, other medicines, foods, dyes, or preservatives -pregnant or trying to get pregnant -breast-feeding How should I use this medicine? This medicine is for infusion into a vein. It is given by a health care professional in a hospital or clinic setting. Talk to your pediatrician regarding the use of this medicine in children. Special care may be needed. Overdosage: If you think you've taken too much of this medicine contact a poison control center or emergency room at once. Overdosage: If you think you have taken too much of this medicine contact a poison control center or emergency room at once. NOTE: This medicine is only for you. Do not share this medicine with others. What if I miss a dose? It is important not to miss your dose. Call your doctor or health care professional if you are unable to keep an appointment. What may interact with this medicine? This medicine may interact with the following medications: -other iron products This list may not describe all possible interactions. Give your health care provider a list of all the medicines, herbs, non-prescription drugs, or dietary supplements you use. Also tell them if you smoke, drink alcohol, or use illegal drugs. Some items may interact with your medicine. What should I watch  for while using this medicine? Visit your doctor or healthcare professional regularly. Tell your doctor or healthcare professional if your symptoms do not start to get better or if they get worse. You may need blood work done while you are taking this medicine. You may need to follow a special diet. Talk to your doctor. Foods that contain iron include: whole grains/cereals, dried fruits, beans, or peas, leafy green vegetables, and organ meats (liver, kidney). What side effects may I notice from receiving this medicine? Side effects that you should report to your doctor or health care professional as soon as possible: -allergic reactions like skin rash, itching or hives, swelling of the face, lips, or tongue -breathing problems -changes in blood pressure -feeling faint or lightheaded, falls -fever or chills -flushing, sweating, or hot feelings -swelling of the ankles or feet Side effects that usually do not require medical attention (Report these to your doctor or health care professional if they continue or are bothersome.): -diarrhea -headache -nausea, vomiting -stomach pain This list may not describe all possible side effects. Call your doctor for medical advice about side effects. You may report side effects to FDA at 1-800-FDA-1088. Where should I keep my medicine? This drug is given in a hospital or clinic and will not be stored at home. NOTE: This sheet is a summary. It may not cover all possible information. If you have questions about this medicine, talk to your doctor, pharmacist, or health care provider.  2012, Elsevier/Gold Standard. (03/03/2008 9:48:25 PM) 

## 2011-10-04 NOTE — Telephone Encounter (Signed)
Called patient to let her know that her iron is dropping needs a dose of Feraheme.  Patient wishes to come today at 2:00p.  Routed to scheduler

## 2011-10-05 ENCOUNTER — Encounter: Payer: Self-pay | Admitting: Family

## 2011-10-05 ENCOUNTER — Telehealth: Payer: Self-pay | Admitting: Family

## 2011-10-05 ENCOUNTER — Ambulatory Visit (HOSPITAL_BASED_OUTPATIENT_CLINIC_OR_DEPARTMENT_OTHER)
Admission: RE | Admit: 2011-10-05 | Discharge: 2011-10-05 | Disposition: A | Discharge status: Home / Self Care | Payer: Medicare Other | Source: Ambulatory Visit | Attending: Family | Admitting: Family

## 2011-10-05 ENCOUNTER — Ambulatory Visit (INDEPENDENT_AMBULATORY_CARE_PROVIDER_SITE_OTHER): Payer: Medicare Other | Admitting: Family

## 2011-10-05 ENCOUNTER — Ambulatory Visit (INDEPENDENT_AMBULATORY_CARE_PROVIDER_SITE_OTHER)
Admission: RE | Admit: 2011-10-05 | Discharge: 2011-10-05 | Disposition: A | Discharge status: Home / Self Care | Payer: Medicare Other | Source: Ambulatory Visit | Attending: Family | Admitting: Family

## 2011-10-05 DIAGNOSIS — R109 Unspecified abdominal pain: Secondary | ICD-10-CM

## 2011-10-05 DIAGNOSIS — K5900 Constipation, unspecified: Secondary | ICD-10-CM

## 2011-10-05 DIAGNOSIS — R112 Nausea with vomiting, unspecified: Secondary | ICD-10-CM

## 2011-10-05 DIAGNOSIS — D1803 Hemangioma of intra-abdominal structures: Secondary | ICD-10-CM

## 2011-10-05 DIAGNOSIS — R1110 Vomiting, unspecified: Secondary | ICD-10-CM | POA: Insufficient documentation

## 2011-10-05 MED ORDER — IOHEXOL 300 MG/ML  SOLN
100.0000 mL | Freq: Once | INTRAMUSCULAR | Status: AC | PRN
  Administered 2011-10-05: 100 mL via INTRAVENOUS

## 2011-10-05 NOTE — Telephone Encounter (Signed)
Spoke to pt. Reviewed ct report. Recommended that she repeat mag citrate, let us know if no bm over weekend. Pt verbalizes understanding.

## 2011-10-05 NOTE — Patient Instructions (Signed)
Please complete your CT on the first floor.  We will call you with your results.

## 2011-10-05 NOTE — Telephone Encounter (Signed)
Notified pt. She reports that she used fleets enema yesterday with very little relief. Took miralax today, with no relief. Used magnesium citrate Sunday without relief. Pt has had nausea and vomiting and reports a little abdominal pain.

## 2011-10-05 NOTE — Progress Notes (Signed)
Subjective:    Patient ID: Denise [name], female    DOB: Mar 08, 1961, 51 y.o.   MRN: [id number]  HPI  Ms.  [name] is a 51 yr old female who presents today with chief complaint of constipation.  She reports that she has not had a BM since last Saturday (4/6).  On Sunday, she took mag citrate- whole bottle, lots water, then dulcolax,  Fleet enema- only had "nuggets".  Tried miralax without improvement.  Denies fever.  + nausea.  Mild lower abd pain.     Review of Systems See HPI  Past Medical History  Diagnosis Date  . Atypical chest pain   . Hyperlipidemia   . Hypertension   . GERD (gastroesophageal reflux disease)   . Diabetes mellitus, type II   . Constipation   . Routine general medical examination at a health care facility   . Gastroparesis   . Iron deficiency anemia   . Esophageal stricture   . Migraine headache   . Endometriosis   . Cyst, ovarian   . Peptic ulcer disease   . Non-compliance   . Allergy     allergic rhinitis    History   Social History  . Marital Status: Married    Spouse Name: N/A    Number of Children: 2  . Years of Education: N/A   Occupational History  . DISABILITY    Social History Main Topics  . Smoking status: Former Smoker    Quit date: 06/25/1994  . Smokeless tobacco: Never Used   Comment: Quit in 1996  . Alcohol Use: Yes     soical drinker  . Drug Use: No  . Sexually Active: Not on file   Other Topics Concern  . Not on file   Social History Narrative   Married Has 2 grown children.  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  She was a pack a day smoker for approximately 10 years.Alcohol use-yes: Social Daily Caffeine Use:6 pack of pepsi daily  Illicit Drug Use - no Patient does not get regular exercise.    Smoking Status:  quit    Past Surgical History  Procedure Date  . Abdominal exploration surgery     w/bso   . Knee surgery 2005     left knee  . Total abdominal hysterectomy   .  Cholecystectomy   . Cardiac catheterization 2009    mild non obstructive CAD    Family History  Problem Relation Age of Onset  . Hypertension Mother   . Hypertension Father   . Diabetes Father   . Prostate cancer Father   . Kidney disease Father   . Cancer Father     prostate cancer  . Diabetes Sister   . Diabetes      grandmother  . Other      no FH of colon cancer,  . Lung cancer Maternal Grandfather     Allergies  Allergen Reactions  . Ace Inhibitors     REACTION: chronic cough  . Celebrex (Celecoxib) Other (See Comments)    "makes me bleed"    Current Outpatient Prescriptions on File Prior to Visit  Medication Sig Dispense Refill  . CARAFATE 1 GM/10ML suspension Take 2 tablespoons full four times daily.      . cyclobenzaprine (FLEXERIL) 5 MG tablet Take 5 mg by mouth Three times a day.      . dicyclomine (BENTYL) 10 MG capsule Take 10 mg by mouth 3 (three) times  daily as needed. For pain       . ergocalciferol (VITAMIN D2) 50000 UNITS capsule Take 1 capsule (50,000 Units total) by mouth once a week.  4 capsule  2  . LORazepam (ATIVAN) 1 MG tablet Take 1 mg by mouth At bedtime as needed.      . naproxen sodium (ANAPROX) 220 MG tablet Take 220 mg by mouth 2 (two) times daily with a meal.        . ondansetron (ZOFRAN-ODT) 8 MG disintegrating tablet Take 8 mg by mouth as needed.      . polyethylene glycol powder (MIRALAX) powder Take 17 g by mouth daily. For indigestion      . SUMAtriptan (IMITREX) 50 MG tablet Take one tablet as needed for migraine.  May repeat once two hours later if needed.  10 tablet  0  . valsartan-hydrochlorothiazide (DIOVAN-HCT) 160-12.5 MG per tablet Take 1 tablet by mouth daily.  30 tablet  3  . nortriptyline (PAMELOR) 10 MG capsule Take 10 mg by mouth daily.      . traMADol (ULTRAM) 50 MG tablet Take 1 tab every 4-6 hours as needed for pain.  30 tablet  0   Current Facility-Administered Medications on File Prior to Visit  Medication Dose Route  Frequency Provider Last Rate Last Dose  . ferumoxytol [facility]) injection 510 mg  510 mg Intravenous Once Josph Macho, MD   510 mg at 10/04/11 1513    BP 160/98  Pulse 68  Temp(Src) 97.8 F (36.6 C) (Oral)  Resp 16  Wt 218 lb 1.9 oz (98.939 kg)  SpO2 95%       Objective:   Physical Exam  Constitutional: She appears well-developed and well-nourished. No distress.  Cardiovascular: Normal rate and regular rhythm.   No murmur heard. Pulmonary/Chest: Effort normal and breath sounds normal. No respiratory distress. She has no wheezes. She has no rales. She exhibits no tenderness.  Abdominal: Soft. Bowel sounds are normal. She exhibits no distension. There is no hepatosplenomegaly. There is tenderness in the right lower quadrant and left lower quadrant. There is no rigidity, no rebound and no guarding.  Musculoskeletal: She exhibits no edema.  Psychiatric: She has a normal mood and affect. Her behavior is normal. Judgment and thought content normal.  GI:  Rectal exam neg for palpable masses.  No stool noted in rectal vault on exam.  Trace stool on glove was tested and was heme neg.         Assessment & Plan:

## 2011-10-05 NOTE — Telephone Encounter (Signed)
I would recommend that she take a dose of miralax and use a fleets enema. Both OTC. Call if abdominal pain or no bm with these measures.

## 2011-10-05 NOTE — Telephone Encounter (Signed)
Advised pt of need to be seen in the office today. Pt aware that she will be worked in and will come into the office now.

## 2011-10-08 ENCOUNTER — Telehealth: Payer: Self-pay | Admitting: Family

## 2011-10-08 NOTE — Telephone Encounter (Signed)
Called pt to follow up.  She reports that she had a bowel  Movement on Saturday.  Recommended that she eat a high fiber diet, drink plenty of water, get regular exercise such as walking. She reports understanding.

## 2011-10-08 NOTE — Assessment & Plan Note (Addendum)
Not is again made of liver hemangioma.  Was 6.5 cm in 2010, now 7.4 cm.  Asymptomatic. Plan to monitor.  Follow up CT in 1 year.  (plan reviewed with Dr. Rodena Medin)

## 2011-10-08 NOTE — Assessment & Plan Note (Signed)
KUB was obtained to exclude SBO.  KUB was neg.  Then a CT abd/pelvis was obtained to further evaluate and exclude underlying etiology such as diverticulitis.  This was unremarkable except note of liver hemangioma.  Pt was contacted on Friday night and instructed to repeat Mag Citrate.  See phone notes for details.

## 2011-11-28 ENCOUNTER — Other Ambulatory Visit: Payer: Self-pay | Admitting: Family

## 2011-11-28 ENCOUNTER — Ambulatory Visit (INDEPENDENT_AMBULATORY_CARE_PROVIDER_SITE_OTHER): Payer: Medicare Other | Admitting: Family

## 2011-11-28 ENCOUNTER — Ambulatory Visit (HOSPITAL_BASED_OUTPATIENT_CLINIC_OR_DEPARTMENT_OTHER): Payer: Medicare Other | Admitting: Hematology & Oncology

## 2011-11-28 ENCOUNTER — Other Ambulatory Visit (HOSPITAL_BASED_OUTPATIENT_CLINIC_OR_DEPARTMENT_OTHER): Payer: Medicare Other | Admitting: Lab

## 2011-11-28 ENCOUNTER — Encounter: Payer: Self-pay | Admitting: Family

## 2011-11-28 VITALS — BP 140/80 | HR 77 | Temp 97.4°F | Ht 64.0 in | Wt 212.0 lb

## 2011-11-28 VITALS — BP 136/86 | HR 76 | Temp 98.2°F | Resp 16 | Ht 64.0 in | Wt 212.0 lb

## 2011-11-28 DIAGNOSIS — Z8719 Personal history of other diseases of the digestive system: Secondary | ICD-10-CM

## 2011-11-28 DIAGNOSIS — E785 Hyperlipidemia, unspecified: Secondary | ICD-10-CM

## 2011-11-28 DIAGNOSIS — D509 Iron deficiency anemia, unspecified: Secondary | ICD-10-CM

## 2011-11-28 DIAGNOSIS — K219 Gastro-esophageal reflux disease without esophagitis: Secondary | ICD-10-CM

## 2011-11-28 DIAGNOSIS — E119 Type 2 diabetes mellitus without complications: Secondary | ICD-10-CM

## 2011-11-28 DIAGNOSIS — R7309 Other abnormal glucose: Secondary | ICD-10-CM

## 2011-11-28 DIAGNOSIS — I10 Essential (primary) hypertension: Secondary | ICD-10-CM

## 2011-11-28 DIAGNOSIS — E0590 Thyrotoxicosis, unspecified without thyrotoxic crisis or storm: Secondary | ICD-10-CM

## 2011-11-28 LAB — BASIC METABOLIC PANEL
BUN: 14 mg/dL (ref 6–23)
CO2: 32 mEq/L (ref 19–32)
Calcium: 9.5 mg/dL (ref 8.4–10.5)
Chloride: 101 mEq/L (ref 96–112)
Creat: 0.74 mg/dL (ref 0.50–1.10)
Glucose, Bld: 90 mg/dL (ref 70–99)
Potassium: 3.8 mEq/L (ref 3.5–5.3)
Sodium: 142 mEq/L (ref 135–145)

## 2011-11-28 LAB — CBC WITH DIFFERENTIAL (CANCER CENTER ONLY)
BASO#: 0 10*3/uL (ref 0.0–0.2)
BASO%: 0.5 % (ref 0.0–2.0)
EOS%: 1.3 % (ref 0.0–7.0)
Eosinophils Absolute: 0.1 10*3/uL (ref 0.0–0.5)
HCT: 39.6 % (ref 34.8–46.6)
HGB: 12.6 g/dL (ref 11.6–15.9)
LYMPH#: 3 10*3/uL (ref 0.9–3.3)
LYMPH%: 38.3 % (ref 14.0–48.0)
MCH: 23.1 pg — ABNORMAL LOW (ref 26.0–34.0)
MCHC: 31.8 g/dL — ABNORMAL LOW (ref 32.0–36.0)
MCV: 73 fL — ABNORMAL LOW (ref 81–101)
MONO#: 0.6 10*3/uL (ref 0.1–0.9)
MONO%: 7.2 % (ref 0.0–13.0)
NEUT#: 4.1 10*3/uL (ref 1.5–6.5)
NEUT%: 52.7 % (ref 39.6–80.0)
Platelets: 250 10*3/uL (ref 145–400)
RBC: 5.46 10*6/uL — ABNORMAL HIGH (ref 3.70–5.32)
RDW: 16.2 % — ABNORMAL HIGH (ref 11.1–15.7)
WBC: 7.8 10*3/uL (ref 3.9–10.0)

## 2011-11-28 LAB — CHCC SATELLITE - SMEAR

## 2011-11-28 LAB — TSH: TSH: 0.148 u[IU]/mL — ABNORMAL LOW (ref 0.350–4.500)

## 2011-11-28 LAB — HEMOGLOBIN A1C
Hgb A1c MFr Bld: 6 % — ABNORMAL HIGH (ref <5.7)
Mean Plasma Glucose: 126 mg/dL — ABNORMAL HIGH (ref <117)

## 2011-11-28 NOTE — Assessment & Plan Note (Signed)
BP Readings from Last 3 Encounters:  11/28/11 136/86  11/28/11 140/80  10/05/11 160/98   BP reasonable. Continue diovan HCT. Obtain BMET.

## 2011-11-28 NOTE — Assessment & Plan Note (Signed)
Clinically stable.  Obtain A1C.  

## 2011-11-28 NOTE — Assessment & Plan Note (Signed)
This is being managed by Dr. Myna Hidalgo.

## 2011-11-28 NOTE — Progress Notes (Signed)
Subjective:    Patient ID: [name], female    DOB: 06-May-1961, 51 y.o.   MRN: [id number]  HPI  [name] is a 51 yr old female who presents today for follow up.  1) HTN-  On diovan HCT, denies cp, sob, swelling.  2) Hyperlipidemia-Diet controlled.   3) Diabetes- Currently diet controlled.    4) Anemia- this is followed by Dr. Myna Hidalgo.  5) Epigastric pain- She reports that she is following at [facility] (Dr. Adriana Simas) at the recommendation of Dr. Arlyce Dice and has undergone manometry, and had an upcoming swallowing test.  She did not pick up aciphex due to cost.     Review of Systems See HPI  Past Medical History  Diagnosis Date  . Atypical chest pain   . Hyperlipidemia   . Hypertension   . GERD (gastroesophageal reflux disease)   . Diabetes mellitus, type II   . Constipation   . Routine general medical examination at a health care facility   . Gastroparesis   . Iron deficiency anemia   . Esophageal stricture   . Migraine headache   . Endometriosis   . Cyst, ovarian   . Peptic ulcer disease   . Non-compliance   . Allergy     allergic rhinitis    History   Social History  . Marital Status: Married    Spouse Name: N/A    Number of Children: 2  . Years of Education: N/A   Occupational History  . DISABILITY    Social History Main Topics  . Smoking status: Former Smoker    Quit date: 06/25/1994  . Smokeless tobacco: Never Used   Comment: Quit in 1996  . Alcohol Use: Yes     soical drinker  . Drug Use: No  . Sexually Active: Not on file   Other Topics Concern  . Not on file   Social History Narrative   Married Has 2 grown children.  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  She was a pack a day smoker for approximately 10 years.Alcohol use-yes: Social Daily Caffeine Use:6 pack of pepsi daily  Illicit Drug Use - no Patient does not get regular exercise.    Smoking Status:  quit    Past Surgical History  Procedure  Date  . Abdominal exploration surgery     w/bso   . Knee surgery 2005     left knee  . Total abdominal hysterectomy   . Cholecystectomy   . Cardiac catheterization 2009    mild non obstructive CAD    Family History  Problem Relation Age of Onset  . Hypertension Mother   . Hypertension Father   . Diabetes Father   . Prostate cancer Father   . Kidney disease Father   . Cancer Father     prostate cancer  . Diabetes Sister   . Diabetes      grandmother  . Other      no FH of colon cancer,  . Lung cancer Maternal Grandfather     Allergies  Allergen Reactions  . Ace Inhibitors     REACTION: chronic cough  . Celebrex (Celecoxib) Other (See Comments)    "makes me bleed"    Current Outpatient Prescriptions on File Prior to Visit  Medication Sig Dispense Refill  . NON FORMULARY RASPBERRY KETONE. Take 1 tablet twice daily for weight loss.      Marland Kitchen omeprazole (PRILOSEC) 20 MG capsule Take 20 mg by  mouth daily.      . polyethylene glycol powder (MIRALAX) powder Take 17 g by mouth daily. For indigestion      . valsartan-hydrochlorothiazide (DIOVAN-HCT) 160-12.5 MG per tablet Take 1 tablet by mouth daily.  30 tablet  3    BP 136/86  Pulse 76  Temp(Src) 98.2 F (36.8 C) (Oral)  Resp 16  Ht 5\' 4"  (1.626 m)  Wt 212 lb (96.163 kg)  BMI 36.39 kg/m2  SpO2 97%       Objective:   Physical Exam  Constitutional: She is oriented to person, place, and time. She appears well-developed and well-nourished. No distress.  Cardiovascular: Normal rate and regular rhythm.   No murmur heard. Pulmonary/Chest: Breath sounds normal. She is in respiratory distress. She has no wheezes. She has no rales. She exhibits no tenderness.  Musculoskeletal: She exhibits no edema.  Neurological: She is alert and oriented to person, place, and time.  Psychiatric: She has a normal mood and affect. Her behavior is normal. Judgment and thought content normal.          Assessment & Plan:

## 2011-11-28 NOTE — Progress Notes (Signed)
This office note has been dictated.

## 2011-11-28 NOTE — Assessment & Plan Note (Signed)
Not fasting.  Obtain FLP next visit.

## 2011-11-28 NOTE — Assessment & Plan Note (Signed)
Pt not on PPI as she was unable to afford Aciphex.  I recommended that she use prilosec otc.  She is undergoing extensive work up at [facility] for her reflux.

## 2011-11-28 NOTE — Patient Instructions (Signed)
Please complete your blood work prior to leaving.  Follow up in 3 months. 

## 2011-11-29 LAB — T4: T4, Total: 12.3 ug/dL (ref 5.0–12.5)

## 2011-11-29 NOTE — Progress Notes (Signed)
CC:   Denise Arbour, MD  DIAGNOSIS:  Recurrent iron deficiency anemia.  CURRENT THERAPY:  IV iron as indicated.  INTERVAL HISTORY:  [name] comes in for followup.  She is doing quite well.  She, I think, last got IV iron back in April.  She got 5010 mg. She usually responds very nicely to the iron.  When we last saw her, her ferritin was 343, but her iron saturation was down to 13%.  Most of the ferritin is an acute phase reactant.  She does feel tired on occasion. She does have a lot of  other medications.  She does have migraines.  She is heading down to [facility] soon to see one of her grandsons. He has lymphoma; he is 71 years old.  He is doing well, however.  She is not having any kind of bleeding issues. There is no change in bowel or bladder habits.  PHYSICAL EXAMINATION:  This is a well-developed, well-nourished black female in no obvious distress.  Vital signs:  Temperature of 97.4, pulse 77, respiratory rate 18, blood pressure 140/80.  Weight is 212.  Head and neck:  Normocephalic, atraumatic skull.  There are no ocular or oral lesions.  There are no palpable cervical or  supraclavicular lymph nodes.  Lungs:  Clear bilaterally.  Cardiac:  Regular rate and rhythm with normal S1 and S2.  She has a 1/6 systolic ejection murmur, which is chronic and stable.  Abdomen:  Soft with good bowel sounds.  There is no palpable abdominal mass.  There is no fluid wave.  There is no palpable hepatosplenomegaly.  Extremities:  No clubbing, cyanosis or edema. Skin:  Shows a papular rash that she has, which is chronic.  LABORATORY STUDIES:  White cell count is 7.8, hemoglobin 12.6, hematocrit 39.6, platelet count 250.  MCV is 73.  IMPRESSION:  [name] is a 51 year old African American female with recurrent iron deficiency anemia.  She has had issues with lower GI bleeding.  She has been seen out at [facility] for this.  So far, there has been no evidence of any kind of  malignancy.  Because her blood count continues to improve, I think we can probably get her through summertime now. Will plan to get her back to see [place] in another 3 months.  Will get her back after Labor Day.    ______________________________ Josph Macho, M.D. PRE/MEDQ  D:  11/28/2011  T:  11/29/2011  Job:  [id number]

## 2011-11-30 ENCOUNTER — Telehealth: Payer: Self-pay | Admitting: *Deleted

## 2011-11-30 ENCOUNTER — Ambulatory Visit: Payer: Medicare Other | Admitting: Family

## 2011-11-30 LAB — T4, FREE: Free T4: 1.3 ng/dL (ref 0.80–1.80)

## 2011-11-30 NOTE — Telephone Encounter (Signed)
Free T4 added per Brad at [facility].

## 2011-11-30 NOTE — Telephone Encounter (Signed)
Message copied by Kathi Simpers on Fri Nov 30, 2011  1:27 PM ------      Message from: O'SULLIVAN, MELISSA      Created: Fri Nov 30, 2011 10:48 AM       Please see if lab can add on FREE t4,  Total T4 was added by accident.  Thanks.

## 2011-12-02 LAB — IRON AND TIBC
%SAT: 46 % (ref 20–55)
Iron: 148 ug/dL — ABNORMAL HIGH (ref 42–145)
TIBC: 324 ug/dL (ref 250–470)
UIBC: 176 ug/dL (ref 125–400)

## 2011-12-02 LAB — TRANSFERRIN RECEPTOR, SOLUABLE: Transferrin Receptor, Soluble: 1.18 mg/L (ref 0.76–1.76)

## 2011-12-02 LAB — FERRITIN: Ferritin: 704 ng/mL — ABNORMAL HIGH (ref 10–291)

## 2011-12-03 ENCOUNTER — Telehealth: Payer: Self-pay | Admitting: Family

## 2011-12-03 DIAGNOSIS — E0590 Thyrotoxicosis, unspecified without thyrotoxic crisis or storm: Secondary | ICD-10-CM

## 2011-12-03 MED ORDER — PROMETHAZINE HCL 25 MG PO TABS: 25.0000 mg | ORAL_TABLET | Freq: Three times a day (TID) | ORAL | Status: DC | PRN

## 2011-12-03 NOTE — Telephone Encounter (Signed)
rx has been sent 

## 2011-12-03 NOTE — Telephone Encounter (Signed)
Pls call patient and let her know that her thyroid testing is showing overactive thyroid.  I would like for her to see endocrinology (pended below).  Kidney function and electrolytes look good. A1C stable at 6.0.

## 2011-12-03 NOTE — Telephone Encounter (Signed)
Left detailed message on home # re: rx completion and to call if any question.

## 2011-12-03 NOTE — Telephone Encounter (Signed)
Notified pt and she is agreeable to proceed with referral. Pt is wanting to know if we would send a refill in for her phenergan that she takes for nausea. States the last med we sent in tasted "fruity".  Please advise.

## 2011-12-21 ENCOUNTER — Telehealth: Payer: Self-pay | Admitting: Family

## 2011-12-21 NOTE — Telephone Encounter (Signed)
Patient states that her breast is "achy" and would like to know what she should do? Heating pad,etc???

## 2011-12-21 NOTE — Telephone Encounter (Signed)
Call placed to patient at [phone], no answer. A voice message was left for patient to return call regarding breast discomfort.

## 2011-12-25 NOTE — Telephone Encounter (Signed)
Left 2nd message w/contact name & number for patient to call back to discuss "achy pain" [called about on Fri, 06.28.13] for more information needed to evaluate & assess by Dr. Marsh Dolly

## 2011-12-26 NOTE — Telephone Encounter (Signed)
Pt called reporting tenderness of both breasts x 1 1/2 weeks. She denies lumps, drainage, sores or fever. Advised pt to avoid caffeine and call in 1 week if symptoms persist per verbal instruction from Provider. Also recommended appt sooner if she develops fever or worsening pain. Pt voices understanding.

## 2012-01-17 ENCOUNTER — Other Ambulatory Visit: Payer: Self-pay | Admitting: Family

## 2012-01-17 NOTE — Telephone Encounter (Signed)
Last Promethazine refill 12/03/11. Please advise re: additional refills.

## 2012-01-17 NOTE — Telephone Encounter (Signed)
Refill sent to pharmacy. Left detailed message on pt's home # to call and let us know if she is currently seeing Gi and if so, who is she seeing? If not, we will discuss at her next follow up in September.

## 2012-01-17 NOTE — Telephone Encounter (Signed)
OK to sen #30 tabs, no refills.  I would like her to discuss her chronic nausea and treatment with GI next visit.  They may have other recommendations for her.

## 2012-02-28 ENCOUNTER — Ambulatory Visit (HOSPITAL_BASED_OUTPATIENT_CLINIC_OR_DEPARTMENT_OTHER): Payer: Medicare Other | Admitting: Hematology & Oncology

## 2012-02-28 ENCOUNTER — Other Ambulatory Visit (HOSPITAL_BASED_OUTPATIENT_CLINIC_OR_DEPARTMENT_OTHER): Payer: Medicare Other | Admitting: Lab

## 2012-02-28 VITALS — BP 170/89 | HR 69 | Temp 97.8°F | Resp 20 | Ht 64.0 in | Wt 216.0 lb

## 2012-02-28 DIAGNOSIS — Z8719 Personal history of other diseases of the digestive system: Secondary | ICD-10-CM

## 2012-02-28 DIAGNOSIS — D509 Iron deficiency anemia, unspecified: Secondary | ICD-10-CM

## 2012-02-28 LAB — CBC WITH DIFFERENTIAL (CANCER CENTER ONLY)
BASO#: 0 10*3/uL (ref 0.0–0.2)
BASO%: 0.1 % (ref 0.0–2.0)
EOS%: 1.3 % (ref 0.0–7.0)
Eosinophils Absolute: 0.1 10*3/uL (ref 0.0–0.5)
HCT: 36 % (ref 34.8–46.6)
HGB: 11.4 g/dL — ABNORMAL LOW (ref 11.6–15.9)
LYMPH#: 2.6 10*3/uL (ref 0.9–3.3)
LYMPH%: 38.4 % (ref 14.0–48.0)
MCH: 23.5 pg — ABNORMAL LOW (ref 26.0–34.0)
MCHC: 31.7 g/dL — ABNORMAL LOW (ref 32.0–36.0)
MCV: 74 fL — ABNORMAL LOW (ref 81–101)
MONO#: 0.6 10*3/uL (ref 0.1–0.9)
MONO%: 8.2 % (ref 0.0–13.0)
NEUT#: 3.5 10*3/uL (ref 1.5–6.5)
NEUT%: 52 % (ref 39.6–80.0)
Platelets: 222 10*3/uL (ref 145–400)
RBC: 4.85 10*6/uL (ref 3.70–5.32)
RDW: 14.4 % (ref 11.1–15.7)
WBC: 6.7 10*3/uL (ref 3.9–10.0)

## 2012-02-28 LAB — IRON AND TIBC
%SAT: 17 % — ABNORMAL LOW (ref 20–55)
Iron: 44 ug/dL (ref 42–145)
TIBC: 265 ug/dL (ref 250–470)
UIBC: 221 ug/dL (ref 125–400)

## 2012-02-28 LAB — FERRITIN: Ferritin: 739 ng/mL — ABNORMAL HIGH (ref 10–291)

## 2012-02-29 ENCOUNTER — Encounter: Payer: Self-pay | Admitting: Family

## 2012-02-29 ENCOUNTER — Ambulatory Visit (INDEPENDENT_AMBULATORY_CARE_PROVIDER_SITE_OTHER): Payer: Medicare Other | Admitting: Family

## 2012-02-29 VITALS — BP 160/100 | HR 74 | Temp 97.7°F | Resp 16 | Ht 64.0 in | Wt 217.0 lb

## 2012-02-29 DIAGNOSIS — E785 Hyperlipidemia, unspecified: Secondary | ICD-10-CM

## 2012-02-29 DIAGNOSIS — K5900 Constipation, unspecified: Secondary | ICD-10-CM

## 2012-02-29 DIAGNOSIS — R7309 Other abnormal glucose: Secondary | ICD-10-CM

## 2012-02-29 DIAGNOSIS — D509 Iron deficiency anemia, unspecified: Secondary | ICD-10-CM

## 2012-02-29 DIAGNOSIS — E0590 Thyrotoxicosis, unspecified without thyrotoxic crisis or storm: Secondary | ICD-10-CM

## 2012-02-29 DIAGNOSIS — E119 Type 2 diabetes mellitus without complications: Secondary | ICD-10-CM

## 2012-02-29 DIAGNOSIS — I10 Essential (primary) hypertension: Secondary | ICD-10-CM

## 2012-02-29 LAB — BASIC METABOLIC PANEL WITH GFR
BUN: 7 mg/dL (ref 6–23)
CO2: 28 mEq/L (ref 19–32)
Calcium: 9 mg/dL (ref 8.4–10.5)
Chloride: 105 mEq/L (ref 96–112)
Creat: 0.81 mg/dL (ref 0.50–1.10)
GFR, Est African American: >89 mL/min
GFR, Est Non African American: 83 mL/min
Glucose, Bld: 122 mg/dL — ABNORMAL HIGH (ref 70–99)
Potassium: 3.4 mEq/L — ABNORMAL LOW (ref 3.5–5.3)
Sodium: 141 mEq/L (ref 135–145)

## 2012-02-29 LAB — HEMOGLOBIN A1C
Hgb A1c MFr Bld: 6.1 % — ABNORMAL HIGH (ref <5.7)
Mean Plasma Glucose: 128 mg/dL — ABNORMAL HIGH (ref <117)

## 2012-02-29 MED ORDER — PROMETHAZINE HCL 25 MG PO TABS: 25.0000 mg | ORAL_TABLET | Freq: Three times a day (TID) | ORAL | Status: DC | PRN

## 2012-02-29 MED ORDER — LUBIPROSTONE 8 MCG PO CAPS: 8.0000 ug | ORAL_CAPSULE | Freq: Two times a day (BID) | ORAL | Status: AC

## 2012-02-29 NOTE — Assessment & Plan Note (Signed)
Deteriorated.  Pt is instructed to resume her BP meds.

## 2012-02-29 NOTE — Patient Instructions (Addendum)
Call if your abdominal pain worsens, or if no improvement in your constipation with Amitiza. Complete your blood work prior to leaving.  Please schedule a follow up appointment in 3 months.

## 2012-02-29 NOTE — Progress Notes (Signed)
Subjective:    Patient ID: [name], female    DOB: 1961-03-30, 51 y.o.   MRN: [id number]  HPI  [name] is a 51 yr old female who presents today for follow up.  1) HTN-  She ran out of her medication and did not take today.    2) Hyperlipidemia-She is watching her diet.    3) DM- borderline-  Last A1C was 6.0.  4) Iron def anemia- following with Dr. Myna [name]- saw him yesterday  5) Hyperthyroid- had consultation with EndoAllena [name]. He is watching her thyroid.    6) Abdominal pain.  Has seen Dr. Adriana [name] at [facility] at the recommendation of Dr. Arlyce [name].  He prescribed protonix which she could not afford.  Left upper abdominal pain- "all the time"  Dull and aching.  Started about 2 weeks ago.  Also pain in the right lower abdomen x few days.  Denies dysuria, hematuria, fever.   Reports BM's are unchanged-  Goes once a week.  Last BM was last Sunday.  She uses miralax, dulcolax.  She uses 2 caps of miralax a day.     Review of Systems    see HPI  Past Medical History  Diagnosis Date  . Atypical chest pain   . Hyperlipidemia   . Hypertension   . GERD (gastroesophageal reflux disease)   . Diabetes mellitus, type II   . Constipation   . Routine general medical examination at a health care facility   . Gastroparesis   . Iron deficiency anemia   . Esophageal stricture   . Migraine headache   . Endometriosis   . Cyst, ovarian   . Peptic ulcer disease   . Non-compliance   . Allergy     allergic rhinitis    History   Social History  . Marital Status: Married    Spouse Name: N/A    Number of Children: 2  . Years of Education: N/A   Occupational History  . DISABILITY    Social History Main Topics  . Smoking status: Former Smoker    Quit date: 06/25/1994  . Smokeless tobacco: Never Used   Comment: Quit in 1996  . Alcohol Use: Yes     soical drinker  . Drug Use: No  . Sexually Active: Not on file   Other Topics Concern  . Not on file   Social History Narrative    Married Has 2 grown children.  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  She was a pack a day smoker for approximately 10 years.Alcohol use-yes: Social Daily Caffeine Use:6 pack of pepsi daily  Illicit Drug Use - no Patient does not get regular exercise.    Smoking Status:  quit    Past Surgical History  Procedure Date  . Abdominal exploration surgery     w/bso   . Knee surgery 2005     left knee  . Total abdominal hysterectomy   . Cholecystectomy   . Cardiac catheterization 2009    mild non obstructive CAD    Family History  Problem Relation Age of Onset  . Hypertension Mother   . Hypertension Father   . Diabetes Father   . Prostate cancer Father   . Kidney disease Father   . Cancer Father     prostate cancer  . Diabetes Sister   . Diabetes      grandmother  . Other      no FH of colon cancer,  .  Lung cancer Maternal Grandfather     Allergies  Allergen Reactions  . Ace Inhibitors     REACTION: chronic cough  . Celebrex (Celecoxib) Other (See Comments)    "makes me bleed"    Current Outpatient Prescriptions on File Prior to Visit  Medication Sig Dispense Refill  . NON FORMULARY RASPBERRY KETONE. Take 1 tablet twice daily for weight loss.      . polyethylene glycol powder (MIRALAX) powder Take 17 g by mouth daily. For indigestion      . promethazine (PHENERGAN) 25 MG tablet TAKE ONE TABLET EVERY 8 HOURS AS NEEDED FOR NAUSEA  30 tablet  0  . valsartan-hydrochlorothiazide (DIOVAN-HCT) 160-12.5 MG per tablet Take 1 tablet by mouth daily.  30 tablet  3    BP 160/100  Pulse 74  Temp 97.7 F (36.5 C) (Oral)  Resp 16  Ht 5\' 4"  (1.626 m)  Wt 217 lb (98.431 kg)  BMI 37.25 kg/m2  SpO2 99%    Objective:   Physical Exam  Constitutional: She appears well-developed and well-nourished. No distress.  Cardiovascular: Normal rate and regular rhythm.   No murmur heard. Pulmonary/Chest: Effort normal and breath sounds normal. No  respiratory distress. She has no wheezes. She has no rales. She exhibits no tenderness.  Abdominal: Soft. Bowel sounds are normal. She exhibits no distension and no mass. There is no rebound and no guarding.       Mild tenderness to palpation left upper quadrant (laterally) mild tenderness to palpation right lower quadrant closer to midline.  Psychiatric: She has a normal mood and affect. Her speech is normal and behavior is normal. Judgment and thought content normal. Cognition and memory are normal.          Assessment & Plan:

## 2012-02-29 NOTE — Assessment & Plan Note (Signed)
Management per hematology- Dr. Myna Hidalgo.

## 2012-02-29 NOTE — Assessment & Plan Note (Signed)
Defer management to Dr. Allena Katz.  Per pt she tells me that her follow up thyroid studies were normal.

## 2012-02-29 NOTE — Assessment & Plan Note (Signed)
Suspect IBS- recommended trial of amitiza (1 week of samples provided), but cost may be an issue for pt.  I suspect that this is also the cause for her abdominal pain.  She has not had a BM in 5 days despite daily use of miralax.

## 2012-02-29 NOTE — Assessment & Plan Note (Signed)
Check A1C 

## 2012-02-29 NOTE — Assessment & Plan Note (Signed)
She is not fasting today.  I have asked her to come fasting to her next visit.

## 2012-03-03 ENCOUNTER — Telehealth: Payer: Self-pay | Admitting: *Deleted

## 2012-03-03 ENCOUNTER — Other Ambulatory Visit: Payer: Self-pay | Admitting: *Deleted

## 2012-03-03 DIAGNOSIS — D509 Iron deficiency anemia, unspecified: Secondary | ICD-10-CM

## 2012-03-03 NOTE — Telephone Encounter (Signed)
Message copied by Anselm Jungling on Mon Mar 03, 2012 11:14 AM ------      Message from: Josph Macho      Created: Sun Mar 02, 2012  8:05 PM       Call - iron is dropping.  Need FeraHeme 1020mg  x 1 dose.  Please set up in 1-2 weeks.  Cindee Lame

## 2012-03-03 NOTE — Telephone Encounter (Signed)
Called patient to let her know that her iron levels were dropping per dr. Myna Hidalgo.  She needs one dose of Feraheme 1020 mg IV x 1 dose.  Patient to set up with scheduler

## 2012-03-04 ENCOUNTER — Telehealth: Payer: Self-pay | Admitting: Family

## 2012-03-04 DIAGNOSIS — E876 Hypokalemia: Secondary | ICD-10-CM

## 2012-03-04 MED ORDER — POTASSIUM CHLORIDE CRYS ER 20 MEQ PO TBCR: 20.0000 meq | EXTENDED_RELEASE_TABLET | Freq: Every day | ORAL | Status: DC

## 2012-03-04 NOTE — Telephone Encounter (Signed)
Pls call pt and let her know that potassium is slightly low. This is likely due to hctz in her bp med.  I would like her to add potassium supplement once a day and repeat bmet in 1 week.  (hypokalemia)

## 2012-03-04 NOTE — Telephone Encounter (Signed)
Notified pt. Future lab order placed for 03/11/12 and copy given to the lab.

## 2012-03-07 ENCOUNTER — Ambulatory Visit: Payer: Medicare Other

## 2012-03-07 VITALS — BP 156/93 | HR 80 | Temp 98.8°F | Resp 20

## 2012-03-07 DIAGNOSIS — D509 Iron deficiency anemia, unspecified: Secondary | ICD-10-CM

## 2012-03-07 MED ORDER — SODIUM CHLORIDE 0.9 % IV SOLN
Freq: Once | INTRAVENOUS | Status: AC
  Administered 2012-03-07: 13:00:00 via INTRAVENOUS

## 2012-03-07 MED ORDER — SODIUM CHLORIDE 0.9 % IV SOLN
1020.0000 mg | Freq: Once | INTRAVENOUS | Status: DC
  Filled 2012-03-07: qty 34

## 2012-03-07 NOTE — Patient Instructions (Signed)
Ferumoxytol injection What is this medicine? FERUMOXYTOL is an iron complex. Iron is used to make healthy red blood cells, which carry oxygen and nutrients throughout the body. This medicine is used to treat iron deficiency anemia in people with chronic kidney disease. This medicine may be used for other purposes; ask your health care provider or pharmacist if you have questions. What should I tell my health care provider before I take this medicine? They need to know if you have any of these conditions: -anemia not caused by low iron levels -high levels of iron in the blood -magnetic resonance imaging (MRI) test scheduled -an unusual or allergic reaction to iron, other medicines, foods, dyes, or preservatives -pregnant or trying to get pregnant -breast-feeding How should I use this medicine? This medicine is for infusion into a vein. It is given by a health care professional in a hospital or clinic setting. Talk to your pediatrician regarding the use of this medicine in children. Special care may be needed. Overdosage: If you think you've taken too much of this medicine contact a poison control center or emergency room at once. Overdosage: If you think you have taken too much of this medicine contact a poison control center or emergency room at once. NOTE: This medicine is only for you. Do not share this medicine with others. What if I miss a dose? It is important not to miss your dose. Call your doctor or health care professional if you are unable to keep an appointment. What may interact with this medicine? This medicine may interact with the following medications: -other iron products This list may not describe all possible interactions. Give your health care provider a list of all the medicines, herbs, non-prescription drugs, or dietary supplements you use. Also tell them if you smoke, drink alcohol, or use illegal drugs. Some items may interact with your medicine. What should I watch  for while using this medicine? Visit your doctor or healthcare professional regularly. Tell your doctor or healthcare professional if your symptoms do not start to get better or if they get worse. You may need blood work done while you are taking this medicine. You may need to follow a special diet. Talk to your doctor. Foods that contain iron include: whole grains/cereals, dried fruits, beans, or peas, leafy green vegetables, and organ meats (liver, kidney). What side effects may I notice from receiving this medicine? Side effects that you should report to your doctor or health care professional as soon as possible: -allergic reactions like skin rash, itching or hives, swelling of the face, lips, or tongue -breathing problems -changes in blood pressure -feeling faint or lightheaded, falls -fever or chills -flushing, sweating, or hot feelings -swelling of the ankles or feet Side effects that usually do not require medical attention (Report these to your doctor or health care professional if they continue or are bothersome.): -diarrhea -headache -nausea, vomiting -stomach pain This list may not describe all possible side effects. Call your doctor for medical advice about side effects. You may report side effects to FDA at 1-800-FDA-1088. Where should I keep my medicine? This drug is given in a hospital or clinic and will not be stored at home. NOTE: This sheet is a summary. It may not cover all possible information. If you have questions about this medicine, talk to your doctor, pharmacist, or health care provider.  2012, Elsevier/Gold Standard. (03/03/2008 9:48:25 PM) 

## 2012-03-13 NOTE — Progress Notes (Signed)
This office note has been dictated.

## 2012-03-14 ENCOUNTER — Telehealth: Payer: Self-pay | Admitting: Hematology & Oncology

## 2012-03-14 NOTE — Telephone Encounter (Signed)
Per MD order to sch 3 month follow up visit.  appt was sch and mailed out to patient's home

## 2012-03-14 NOTE — Progress Notes (Signed)
CC:   Marguarite Arbour, MD  DIAGNOSIS:  Recurrent iron-deficiency anemia.  CURRENT THERAPY:  IV iron as indicated.  INTERIM HISTORY:  [name] comes in for her followup.  She is doing okay.  She does feel a little bit fatigued.  She has had no obvious bleeding.  She does have occasional migraines.  Thankfully, her grandson is doing quite well.  He had lymphoma.  He had a bone marrow transplant down in [place].  She has had no rashes.  She has had no change in medications.  When we last saw her back in June, her ferritin was 704 with a saturation of 46%.  PHYSICAL EXAMINATION:  General:  This is a mildly obese black female in no obvious distress.  Vital signs:  976.8, pulse 69, respiratory rate 18, blood pressure 170/89.  Weight is 216.  Head and neck: Normocephalic, atraumatic skull.  There are no ocular or oral lesions. There are no palpable cervical or supraclavicular lymph nodes.  Lungs: Clear bilaterally.  Cardiac:  Regular rate and rhythm with a normal S1 and S2.  She has a 1/6 systolic ejection murmur.  Abdomen:  Soft with good bowel sounds.  There is no fluid wave.  There is no palpable abdominal mass.  No palpable hepatosplenomegaly.  Back:  No tenderness over the spine, ribs, or hips.  Extremities:  No clubbing, cyanosis or edema.  LABORATORY STUDIES:  White cell count is 6.7, hemoglobin 11.4, hematocrit 36, platelet count 222.  Ferritin is 739.  Iron saturation is only 17%.  Her total iron is 44.  IMPRESSION:  [name] is a 51 year old African American female.  She has recurrent iron-deficiency anemia.  Despite the ferritin being elevated, I still suspect that she is iron deficient.  I will go ahead and give her dose of iron.  We will give her a dose of Feraheme.  I will plan to see her back in another 3 months.    ______________________________ Josph Macho, M.D. PRE/MEDQ  D:  03/13/2012  T:  03/14/2012  Job:  [id number]

## 2012-03-19 ENCOUNTER — Telehealth: Payer: Self-pay | Admitting: *Deleted

## 2012-03-19 NOTE — Telephone Encounter (Signed)
Received message from pt that her hands stay white and she feels cold all the time. Reports that she had an iron injection on 03/07/12. Advised pt per verbal ok from Provider to contact Dr Myna Hidalgo re: current symptoms.  Instructed pt to call us back if he doesn't think her current symptoms are related to her anemia.

## 2012-04-28 ENCOUNTER — Telehealth: Payer: Self-pay | Admitting: Family

## 2012-04-28 NOTE — Telephone Encounter (Signed)
Refill- gabapentin 100mg  cap. Take one to three capsules by mouth three times daily as needed as directed. Qty 90 last fill 8.27.12

## 2012-04-28 NOTE — Telephone Encounter (Signed)
It appears that medication has "fallen off" of pt's med list.  Verified with pt that she no longer takes gabapentin. Pt reports that she was trying to request tramadol for her back pain.  Please advise re: quantity and refills.

## 2012-04-29 MED ORDER — TRAMADOL HCL 50 MG PO TABS: 50.0000 mg | ORAL_TABLET | Freq: Three times a day (TID) | ORAL | Status: DC | PRN

## 2012-04-29 NOTE — Telephone Encounter (Signed)
Rx sent for tramadol

## 2012-04-29 NOTE — Telephone Encounter (Signed)
Left detailed message on home# and to call if any questions. 

## 2012-05-02 ENCOUNTER — Encounter (HOSPITAL_BASED_OUTPATIENT_CLINIC_OR_DEPARTMENT_OTHER): Payer: Self-pay | Admitting: *Deleted

## 2012-05-02 ENCOUNTER — Telehealth: Payer: Self-pay | Admitting: Hematology & Oncology

## 2012-05-02 ENCOUNTER — Telehealth: Payer: Self-pay | Admitting: Family

## 2012-05-02 ENCOUNTER — Emergency Department (HOSPITAL_BASED_OUTPATIENT_CLINIC_OR_DEPARTMENT_OTHER)
Admission: EM | Admit: 2012-05-02 | Discharge: 2012-05-02 | Disposition: A | Discharge status: Home / Self Care | Payer: Medicare Other | Attending: Emergency Medicine | Admitting: Emergency Medicine

## 2012-05-02 DIAGNOSIS — K219 Gastro-esophageal reflux disease without esophagitis: Secondary | ICD-10-CM | POA: Insufficient documentation

## 2012-05-02 DIAGNOSIS — R531 Weakness: Secondary | ICD-10-CM

## 2012-05-02 DIAGNOSIS — E785 Hyperlipidemia, unspecified: Secondary | ICD-10-CM | POA: Insufficient documentation

## 2012-05-02 DIAGNOSIS — Z79899 Other long term (current) drug therapy: Secondary | ICD-10-CM | POA: Insufficient documentation

## 2012-05-02 DIAGNOSIS — I10 Essential (primary) hypertension: Secondary | ICD-10-CM | POA: Insufficient documentation

## 2012-05-02 DIAGNOSIS — IMO0001 Reserved for inherently not codable concepts without codable children: Secondary | ICD-10-CM | POA: Insufficient documentation

## 2012-05-02 DIAGNOSIS — Z87891 Personal history of nicotine dependence: Secondary | ICD-10-CM | POA: Insufficient documentation

## 2012-05-02 DIAGNOSIS — E119 Type 2 diabetes mellitus without complications: Secondary | ICD-10-CM | POA: Insufficient documentation

## 2012-05-02 DIAGNOSIS — E876 Hypokalemia: Secondary | ICD-10-CM

## 2012-05-02 LAB — COMPREHENSIVE METABOLIC PANEL
ALT: 27 U/L (ref 0–35)
AST: 25 U/L (ref 0–37)
Albumin: 4.1 g/dL (ref 3.5–5.2)
Alkaline Phosphatase: 137 U/L — ABNORMAL HIGH (ref 39–117)
BUN: 13 mg/dL (ref 6–23)
CO2: 30 mEq/L (ref 19–32)
Calcium: 10 mg/dL (ref 8.4–10.5)
Chloride: 96 mEq/L (ref 96–112)
Creatinine, Ser: 0.8 mg/dL (ref 0.50–1.10)
GFR calc Af Amer: >90 mL/min (ref >90)
GFR calc non Af Amer: 83 mL/min — ABNORMAL LOW (ref >90)
Glucose, Bld: 96 mg/dL (ref 70–99)
Potassium: 2.9 mEq/L — ABNORMAL LOW (ref 3.5–5.1)
Sodium: 138 mEq/L (ref 135–145)
Total Bilirubin: 0.4 mg/dL (ref 0.3–1.2)
Total Protein: 8.1 g/dL (ref 6.0–8.3)

## 2012-05-02 LAB — TROPONIN I: Troponin I: <0.3 ng/mL (ref <0.30)

## 2012-05-02 LAB — CBC WITH DIFFERENTIAL/PLATELET
Basophils Absolute: 0 10*3/uL (ref 0.0–0.1)
Basophils Relative: 0 % (ref 0–1)
Eosinophils Absolute: 0.1 10*3/uL (ref 0.0–0.7)
Eosinophils Relative: 1 % (ref 0–5)
HCT: 37.6 % (ref 36.0–46.0)
Hemoglobin: 11.8 g/dL — ABNORMAL LOW (ref 12.0–15.0)
Lymphocytes Relative: 34 % (ref 12–46)
Lymphs Abs: 2.6 10*3/uL (ref 0.7–4.0)
MCH: 23 pg — ABNORMAL LOW (ref 26.0–34.0)
MCHC: 31.4 g/dL (ref 30.0–36.0)
MCV: 73.2 fL — ABNORMAL LOW (ref 78.0–100.0)
Monocytes Absolute: 0.5 10*3/uL (ref 0.1–1.0)
Monocytes Relative: 7 % (ref 3–12)
Neutro Abs: 4.4 10*3/uL (ref 1.7–7.7)
Neutrophils Relative %: 58 % (ref 43–77)
Platelets: 203 10*3/uL (ref 150–400)
RBC: 5.14 MIL/uL — ABNORMAL HIGH (ref 3.87–5.11)
RDW: 14.1 % (ref 11.5–15.5)
WBC: 7.5 10*3/uL (ref 4.0–10.5)

## 2012-05-02 LAB — URINALYSIS, ROUTINE W REFLEX MICROSCOPIC
Bilirubin Urine: NEGATIVE
Glucose, UA: NEGATIVE mg/dL
Hgb urine dipstick: NEGATIVE
Ketones, ur: NEGATIVE mg/dL
Leukocytes, UA: NEGATIVE
Nitrite: NEGATIVE
Protein, ur: NEGATIVE mg/dL
Specific Gravity, Urine: 1.022 (ref 1.005–1.030)
Urobilinogen, UA: 0.2 mg/dL (ref 0.0–1.0)
pH: 5.5 (ref 5.0–8.0)

## 2012-05-02 LAB — SEDIMENTATION RATE: Sed Rate: 11 mm/hr (ref 0–22)

## 2012-05-02 MED ORDER — POTASSIUM CHLORIDE CRYS ER 20 MEQ PO TBCR: 20.0000 meq | EXTENDED_RELEASE_TABLET | Freq: Two times a day (BID) | ORAL | Status: DC

## 2012-05-02 MED ORDER — GABAPENTIN 100 MG PO CAPS: ORAL_CAPSULE | ORAL | Status: DC

## 2012-05-02 NOTE — Telephone Encounter (Signed)
Patient cx 06/13/12 appt and resch for 05/12/12.

## 2012-05-02 NOTE — ED Provider Notes (Signed)
History     CSN: [id number]  Arrival date & time 05/02/12  1401   First MD Initiated Contact with Patient 05/02/12 1514      Chief Complaint  Patient presents with  . Fatigue  . Generalized Body Aches    (Consider location/radiation/quality/duration/timing/severity/associated sxs/prior treatment) HPI Comments: Patient presents with a two week history of weakness, fatigue, and muscle and joint aches.  She denies any fever, cough, sore throat, shortness of breath or any other symptoms.    Patient is a 51 y.o. female presenting with weakness. The history is provided by the patient.  Weakness Primary symptoms do not include fever. Primary symptoms comment: weakness Episode onset: 2 weeks ago. The symptoms are unchanged. The neurological symptoms are diffuse.  Additional symptoms include weakness. Medical issues do not include seizures or hypertension.    Past Medical History  Diagnosis Date  . Atypical chest pain   . Hyperlipidemia   . Hypertension   . GERD (gastroesophageal reflux disease)   . Diabetes mellitus, type II   . Constipation   . Routine general medical examination at a health care facility   . Gastroparesis   . Iron deficiency anemia   . Esophageal stricture   . Migraine headache   . Endometriosis   . Cyst, ovarian   . Peptic ulcer disease   . Non-compliance   . Allergy     allergic rhinitis    Past Surgical History  Procedure Date  . Abdominal exploration surgery     w/bso   . Knee surgery 2005     left knee  . Total abdominal hysterectomy   . Cholecystectomy   . Cardiac catheterization 2009    mild non obstructive CAD    Family History  Problem Relation Age of Onset  . Hypertension Mother   . Hypertension Father   . Diabetes Father   . Prostate cancer Father   . Kidney disease Father   . Cancer Father     prostate cancer  . Diabetes Sister   . Diabetes      grandmother  . Other      no FH of colon cancer,  . Lung cancer Maternal  Grandfather     History  Substance Use Topics  . Smoking status: Former Smoker    Quit date: 06/25/1994  . Smokeless tobacco: Never Used     Comment: Quit in 1996  . Alcohol Use: Yes     Comment: soical drinker    OB History    Grav Para Term Preterm Abortions TAB SAB Ect Mult Living                  Review of Systems  Constitutional: Positive for fatigue. Negative for fever and chills.  Musculoskeletal: Positive for myalgias and arthralgias.  Neurological: Positive for weakness.  All other systems reviewed and are negative.    Allergies  Ace inhibitors and Celebrex  Home Medications   Current Outpatient Rx  Name  Route  Sig  Dispense  Refill  . GABAPENTIN 100 MG PO CAPS      Take 1-3 capsules by mouth three times daily.   90 capsule   1   . NON FORMULARY      RASPBERRY KETONE. Take 1 tablet twice daily for weight loss.         Marland Kitchen POLYETHYLENE GLYCOL 3350 PO POWD   Oral   Take 17 g by mouth daily. For indigestion         .  POTASSIUM CHLORIDE CRYS ER 20 MEQ PO TBCR   Oral   Take 1 tablet (20 mEq total) by mouth daily.   30 tablet   0   . PROMETHAZINE HCL 25 MG PO TABS   Oral   Take 1 tablet (25 mg total) by mouth every 8 (eight) hours as needed for nausea.   30 tablet   0   . TRAMADOL HCL 50 MG PO TABS   Oral   Take 1 tablet (50 mg total) by mouth every 8 (eight) hours as needed.   30 tablet   0   . VALSARTAN-HYDROCHLOROTHIAZIDE 160-12.5 MG PO TABS   Oral   Take 1 tablet by mouth daily.   30 tablet   3     BP 159/90  Pulse 79  Temp 98.4 F (36.9 C) (Oral)  Resp 18  SpO2 100%  Physical Exam  Nursing note and vitals reviewed. Constitutional: She is oriented to person, place, and time. She appears well-developed and well-nourished. No distress.  HENT:  Head: Normocephalic and atraumatic.  Neck: Normal range of motion. Neck supple.  Cardiovascular: Normal rate and regular rhythm.  Exam reveals no gallop and no friction rub.   No  murmur heard. Pulmonary/Chest: Effort normal and breath sounds normal. No respiratory distress. She has no wheezes.  Abdominal: Soft. Bowel sounds are normal. She exhibits no distension. There is no tenderness.  Musculoskeletal: Normal range of motion.  Neurological: She is alert and oriented to person, place, and time.  Skin: Skin is warm and dry. She is not diaphoretic.    ED Course  Procedures (including critical care time)   Labs Reviewed  URINALYSIS, ROUTINE W REFLEX MICROSCOPIC  CBC WITH DIFFERENTIAL  COMPREHENSIVE METABOLIC PANEL  SEDIMENTATION RATE  TROPONIN I   No results found.   No diagnosis found.   Date: 05/03/2012  Rate: 84  Rhythm: normal sinus rhythm  QRS Axis: normal  Intervals: QT prolonged  ST/T Wave abnormalities: normal  Conduction Disutrbances:none  Narrative Interpretation:   Old EKG Reviewed: unchanged    MDM  The patient presents with weakness and fatigue for the past two days.  Her physical exam is unremarkable and the workup is remarkable only for a potassium of 2.9.  She appears well and seems appropriate for discharge.  Will treat with po potassium, return prn.        Geoffery Lyons, MD 05/03/12 [phone]

## 2012-05-02 NOTE — ED Notes (Signed)
Upon further questioning pt now states that she has had intermittent "sharp" chest pains for 3 weeks also. ekg performed at triage. Denies sob or nausea.

## 2012-05-02 NOTE — Telephone Encounter (Signed)
Spoke with pt. She reports constant chest pain x 1 week. Has associated left arm pain and tingling. Reports "some shortness of breath and cough every now and then".  Advised pt she should be evaluated in the ER. Pt voices understanding but did not commit to further evaluation.

## 2012-05-02 NOTE — Telephone Encounter (Signed)
Gabapentin 100 mg cap take 1 to three capsules by mouth 3 times daily as needed qty 90 last fill 03-21-2012

## 2012-05-02 NOTE — ED Notes (Signed)
Pt. Is in no distress with no noted shortness of breath and ability to speak clear sentences.

## 2012-05-02 NOTE — ED Notes (Signed)
Pt amb to triage with quick steady gait in nad. Pt reports 3 weeks of fatigue, and joint pains, pt states her mother had arthritis and she wonders if she could be getting it..the patient also states that she often feels sleepy and can nod off to sleep easily.  Pt states her hands feel cold often also. Pt went upstairs to have labs drawn, and was told to come to ed for eval.

## 2012-05-02 NOTE — ED Notes (Signed)
MD at bedside. 

## 2012-05-02 NOTE — Telephone Encounter (Signed)
Upon review of chart, it appears that medication is no longer on current med list. Last rx sent by [place] was 02/08/11 #90 x 1 refill. Spoke with Fulton Mole at [place] and she confirmed that pt's last refill was from Rx of 02/08/11. Verbal given for #90 x 1 refill.

## 2012-05-12 ENCOUNTER — Ambulatory Visit: Payer: Medicare Other | Admitting: Medical

## 2012-05-12 ENCOUNTER — Other Ambulatory Visit: Payer: Medicare Other | Admitting: Lab

## 2012-05-13 ENCOUNTER — Telehealth: Payer: Self-pay | Admitting: Hematology & Oncology

## 2012-05-13 NOTE — Telephone Encounter (Signed)
Patient missed 05/12/12 apt.  She called and resch missed apt for 05/16/12

## 2012-05-16 ENCOUNTER — Ambulatory Visit (HOSPITAL_BASED_OUTPATIENT_CLINIC_OR_DEPARTMENT_OTHER): Payer: Medicare Other | Admitting: Medical

## 2012-05-16 ENCOUNTER — Other Ambulatory Visit (HOSPITAL_BASED_OUTPATIENT_CLINIC_OR_DEPARTMENT_OTHER): Payer: Medicare Other | Admitting: Lab

## 2012-05-16 VITALS — BP 119/75 | HR 71 | Temp 98.8°F | Resp 18 | Ht 64.0 in | Wt 212.0 lb

## 2012-05-16 DIAGNOSIS — D509 Iron deficiency anemia, unspecified: Secondary | ICD-10-CM

## 2012-05-16 LAB — CBC WITH DIFFERENTIAL (CANCER CENTER ONLY)
BASO#: 0 10*3/uL (ref 0.0–0.2)
BASO%: 0.4 % (ref 0.0–2.0)
EOS%: 0.9 % (ref 0.0–7.0)
Eosinophils Absolute: 0.1 10*3/uL (ref 0.0–0.5)
HCT: 38 % (ref 34.8–46.6)
HGB: 11.9 g/dL (ref 11.6–15.9)
LYMPH#: 2.4 10*3/uL (ref 0.9–3.3)
LYMPH%: 42.1 % (ref 14.0–48.0)
MCH: 23.2 pg — ABNORMAL LOW (ref 26.0–34.0)
MCHC: 31.3 g/dL — ABNORMAL LOW (ref 32.0–36.0)
MCV: 74 fL — ABNORMAL LOW (ref 81–101)
MONO#: 0.4 10*3/uL (ref 0.1–0.9)
MONO%: 6.7 % (ref 0.0–13.0)
NEUT#: 2.8 10*3/uL (ref 1.5–6.5)
NEUT%: 49.9 % (ref 39.6–80.0)
Platelets: 231 10*3/uL (ref 145–400)
RBC: 5.14 10*6/uL (ref 3.70–5.32)
RDW: 14.1 % (ref 11.1–15.7)
WBC: 5.7 10*3/uL (ref 3.9–10.0)

## 2012-05-16 LAB — FERRITIN: Ferritin: 1045 ng/mL — ABNORMAL HIGH (ref 10–291)

## 2012-05-16 LAB — IRON AND TIBC
%SAT: 34 % (ref 20–55)
Iron: 100 ug/dL (ref 42–145)
TIBC: 293 ug/dL (ref 250–470)
UIBC: 193 ug/dL (ref 125–400)

## 2012-05-16 NOTE — Progress Notes (Signed)
Diagnosis: Recurrent iron deficiency anemia.  Current therapy: IV iron as indicated.  Interim history: [name] comes in today for an office followup visit.  Overall, she, reports, that she's been doing quite well.  She does continue to feel little bit fatigued.  The last, time, she had a dose of IV iron was back in September.  Her iron was 44, with 17% saturation.  Her ferritin was 739.  She does have some chronic joint pain.  She does have occasional migraines.  She reports, that her appetite is decent.  She denies any nausea, vomiting, diarrhea, constipation.  Any cough, chest pain, shortness of breath.  Any fevers, chills, or night sweats.  In any adenopathy.  She denies any headaches, visual changes, or rashes.  She denies any obvious, or abnormal bleeding or bruising.  She denies any lower leg swelling.  She denies craving any ice.  Review of Systems: Constitutional:Negative for malaise/fatigue, fever, chills, weight loss, diaphoresis, activity change, appetite change, and unexpected weight change.  HEENT: Negative for double vision, blurred vision, visual loss, ear pain, tinnitus, congestion, rhinorrhea, epistaxis sore throat or sinus disease, oral pain/lesion, tongue soreness Respiratory: Negative for cough, chest tightness, shortness of breath, wheezing and stridor.  Cardiovascular: Negative for chest pain, palpitations, leg swelling, orthopnea, PND, DOE or claudication Gastrointestinal: Negative for nausea, vomiting, abdominal pain, diarrhea, constipation, blood in stool, melena, hematochezia, abdominal distention, anal bleeding, rectal pain, anorexia and hematemesis.  Genitourinary: Negative for dysuria, frequency, hematuria,  Musculoskeletal: Negative for myalgias, back pain, joint swelling, arthralgias and gait problem.  Skin: Negative for rash, color change, pallor and wound.  Neurological:. Negative for dizziness/light-headedness, tremors, seizures, syncope, facial asymmetry, speech  difficulty, weakness, numbness, headaches and paresthesias.  Hematological: Negative for adenopathy. Does not bruise/bleed easily.  Psychiatric/Behavioral:  Negative for depression, no loss of interest in normal activity or change in sleep pattern.   Physical Exam: This is a mildly obese, 51 year old, African American female, in no obvious distress Vitals: Temperature 90.8 degrees, pulse 71, respirations 18, blood pressure 119/75.  Weight 212 pounds HEENT reveals a normocephalic, atraumatic skull, no scleral icterus, no oral lesions  Neck is supple without any cervical or supraclavicular adenopathy.  Lungs are clear to auscultation bilaterally. There are no wheezes, rales or rhonci Cardiac is regular rate and rhythm with a normal S1 and S2. There are no murmurs, rubs, or bruits.  Abdomen is soft with good bowel sounds, there is no palpable mass. There is no palpable hepatosplenomegaly. There is no palpable fluid wave.  Musculoskeletal no tenderness of the spine, ribs, or hips.  Extremities there are no clubbing, cyanosis, or edema.  Skin no petechia, purpura or ecchymosis Neurologic is nonfocal.  Laboratory Data: White count 5.7, hemoglobin 1.9, hematocrit 30.0, platelets 231,000  Current Outpatient Prescriptions on File Prior to Visit  Medication Sig Dispense Refill  . gabapentin (NEURONTIN) 100 MG capsule Take 1-3 capsules by mouth three times daily.  90 capsule  1  . NON FORMULARY RASPBERRY KETONE. Take 1 tablet twice daily for weight loss.      . polyethylene glycol powder (MIRALAX) powder Take 17 g by mouth daily. For indigestion      . potassium chloride SA (K-DUR,KLOR-CON) 20 MEQ tablet Take 1 tablet (20 mEq total) by mouth daily.  30 tablet  0  . potassium chloride SA (K-DUR,KLOR-CON) 20 MEQ tablet Take 1 tablet (20 mEq total) by mouth 2 (two) times daily.  15 tablet  0  . promethazine (PHENERGAN) 25 MG  tablet Take 1 tablet (25 mg total) by mouth every 8 (eight) hours as needed for  nausea.  30 tablet  0  . traMADol (ULTRAM) 50 MG tablet Take 1 tablet (50 mg total) by mouth every 8 (eight) hours as needed.  30 tablet  0  . valsartan-hydrochlorothiazide (DIOVAN-HCT) 160-12.5 MG per tablet Take 1 tablet by mouth daily.  30 tablet  3   Assessment/Plan: [name] is a 51 year old F. American female, with the following issues:  #1 recurrent iron deficiency anemia.  Her last dose of IV iron was back in September.  We will await her iron panel from today.  If her iron is low, we will call her to come in.  We will continue to monitor her closely.  New.  #2 followup.  We will follow back up with [name] in 2 months, but before then should there be questions or concerns.

## 2012-05-19 ENCOUNTER — Telehealth: Payer: Self-pay | Admitting: *Deleted

## 2012-05-19 NOTE — Telephone Encounter (Signed)
Message copied by Anselm Jungling on Mon May 19, 2012 11:34 AM ------      Message from: Arlan Organ R      Created: Mon May 19, 2012  7:33 AM       Call and tell her that her iron studies are still okay. Thanks. Cindee Lame

## 2012-05-19 NOTE — Telephone Encounter (Signed)
Called patient to let her know that her labwork and iron studies all looked ok per dr. Myna Hidalgo

## 2012-05-30 ENCOUNTER — Encounter: Payer: Self-pay | Admitting: Family

## 2012-05-30 ENCOUNTER — Ambulatory Visit (INDEPENDENT_AMBULATORY_CARE_PROVIDER_SITE_OTHER): Payer: Medicare Other | Admitting: Family

## 2012-05-30 ENCOUNTER — Ambulatory Visit: Payer: Medicare Other | Admitting: Family

## 2012-05-30 VITALS — BP 120/78 | HR 78 | Temp 99.3°F | Resp 16 | Ht 64.0 in | Wt 214.1 lb

## 2012-05-30 DIAGNOSIS — E785 Hyperlipidemia, unspecified: Secondary | ICD-10-CM

## 2012-05-30 DIAGNOSIS — R7309 Other abnormal glucose: Secondary | ICD-10-CM

## 2012-05-30 DIAGNOSIS — I10 Essential (primary) hypertension: Secondary | ICD-10-CM

## 2012-05-30 DIAGNOSIS — Z23 Encounter for immunization: Secondary | ICD-10-CM

## 2012-05-30 DIAGNOSIS — E876 Hypokalemia: Secondary | ICD-10-CM

## 2012-05-30 DIAGNOSIS — G43909 Migraine, unspecified, not intractable, without status migrainosus: Secondary | ICD-10-CM

## 2012-05-30 LAB — LIPID PANEL
Cholesterol: 294 mg/dL — ABNORMAL HIGH (ref 0–200)
HDL: 50 mg/dL (ref >39)
LDL Cholesterol: 225 mg/dL — ABNORMAL HIGH (ref 0–99)
Total CHOL/HDL Ratio: 5.9 Ratio
Triglycerides: 94 mg/dL (ref <150)
VLDL: 19 mg/dL (ref 0–40)

## 2012-05-30 LAB — BASIC METABOLIC PANEL
BUN: 11 mg/dL (ref 6–23)
CO2: 29 mEq/L (ref 19–32)
Calcium: 9.2 mg/dL (ref 8.4–10.5)
Chloride: 106 mEq/L (ref 96–112)
Creat: 0.72 mg/dL (ref 0.50–1.10)
Glucose, Bld: 78 mg/dL (ref 70–99)
Potassium: 3.5 mEq/L (ref 3.5–5.3)
Sodium: 144 mEq/L (ref 135–145)

## 2012-05-30 MED ORDER — CYCLOBENZAPRINE HCL 10 MG PO TABS: 10.0000 mg | ORAL_TABLET | ORAL | Status: DC | PRN

## 2012-05-30 MED ORDER — KETOROLAC TROMETHAMINE 30 MG/ML IJ SOLN
30.0000 mg | Freq: Once | INTRAMUSCULAR | Status: AC
  Administered 2012-05-30: 30 mg via INTRAMUSCULAR

## 2012-05-30 NOTE — Assessment & Plan Note (Signed)
BP stable on current meds.  Obtain bmet. 

## 2012-05-30 NOTE — Progress Notes (Signed)
Subjective:    Patient ID: [name], female    DOB: February 14, 1961, 51 y.o.   MRN: [id number]  HPI  [name] is a 51 yr old female who presents today for follow up.  HTN-on valsartan/hctz.  Hyperlipidemia- She is fasting today.   DM2- She tries to watch your diet. She does not check her sugars at home.  Denies polyuria/polydipsia.  Migraines-  She used to see Neurology.  She reports migraine HA's more frequently. She reports cluster migraines.  Has HA currently.    Review of Systems    see HPI  Past Medical History  Diagnosis Date  . Atypical chest pain   . Hyperlipidemia   . Hypertension   . GERD (gastroesophageal reflux disease)   . Diabetes mellitus, type II   . Constipation   . Routine general medical examination at a health care facility   . Gastroparesis   . Iron deficiency anemia   . Esophageal stricture   . Migraine headache   . Endometriosis   . Cyst, ovarian   . Peptic ulcer disease   . Non-compliance   . Allergy     allergic rhinitis    History   Social History  . Marital Status: Married    Spouse Name: N/A    Number of Children: 2  . Years of Education: N/A   Occupational History  . DISABILITY    Social History Main Topics  . Smoking status: Former Smoker    Quit date: 06/25/1994  . Smokeless tobacco: Never Used     Comment: Quit in 1996  . Alcohol Use: Yes     Comment: soical drinker  . Drug Use: No  . Sexually Active: Not on file   Other Topics Concern  . Not on file   Social History Narrative   Married Has 2 grown children.  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  She was a pack a day smoker for approximately 10 years.Alcohol use-yes: Social Daily Caffeine Use:6 pack of pepsi daily  Illicit Drug Use - no Patient does not get regular exercise.    Smoking Status:  quit    Past Surgical History  Procedure Date  . Abdominal exploration surgery     w/bso   . Knee surgery 2005     left knee   . Total abdominal hysterectomy   . Cholecystectomy   . Cardiac catheterization 2009    mild non obstructive CAD    Family History  Problem Relation Age of Onset  . Hypertension Mother   . Hypertension Father   . Diabetes Father   . Prostate cancer Father   . Kidney disease Father   . Cancer Father     prostate cancer  . Diabetes Sister   . Diabetes      grandmother  . Other      no FH of colon cancer,  . Lung cancer Maternal Grandfather     Allergies  Allergen Reactions  . Ace Inhibitors     REACTION: chronic cough  . Celebrex (Celecoxib) Other (See Comments)    "makes me bleed"    Current Outpatient Prescriptions on File Prior to Visit  Medication Sig Dispense Refill  . polyethylene glycol powder (MIRALAX) powder Take 17 g by mouth daily. For indigestion      . traMADol (ULTRAM) 50 MG tablet Take 1 tablet (50 mg total) by mouth every 8 (eight) hours as needed.  30 tablet  0  .  valsartan-hydrochlorothiazide (DIOVAN-HCT) 160-12.5 MG per tablet Take 1 tablet by mouth daily.  30 tablet  3   No current facility-administered medications on file prior to visit.    BP 120/78  Pulse 78  Temp 99.3 F (37.4 C) (Oral)  Resp 16  Ht 5\' 4"  (1.626 m)  Wt 214 lb 1.3 oz (97.106 kg)  BMI 36.75 kg/m2  SpO2 99%    Objective:   Physical Exam  Constitutional: She is oriented to person, place, and time. She appears well-developed and well-nourished. No distress.  Cardiovascular: Normal rate and regular rhythm.   No murmur heard. Pulmonary/Chest: Effort normal and breath sounds normal. No respiratory distress. She has no wheezes. She has no rales. She exhibits no tenderness.  Musculoskeletal: She exhibits no edema.  Neurological: She is alert and oriented to person, place, and time.  Skin: Skin is warm and dry.  Psychiatric: She has a normal mood and affect. Her behavior is normal. Judgment and thought content normal.          Assessment & Plan:

## 2012-05-30 NOTE — Assessment & Plan Note (Signed)
Will refer to [facility].  Pt given IM toradol today.

## 2012-05-30 NOTE — Assessment & Plan Note (Signed)
Last A1C was in September and was at goal at 6.1.

## 2012-05-30 NOTE — Assessment & Plan Note (Signed)
·   Obtain FLP

## 2012-05-30 NOTE — Patient Instructions (Addendum)
Please follow up in 3 months. Complete your lab work prior to leaving.  You will be contact about your referral to neurology.  Please let us know if you have not heard back within 1 week about your referral.

## 2012-06-02 ENCOUNTER — Telehealth: Payer: Self-pay | Admitting: Family

## 2012-06-02 DIAGNOSIS — E785 Hyperlipidemia, unspecified: Secondary | ICD-10-CM

## 2012-06-02 MED ORDER — ATORVASTATIN CALCIUM 40 MG PO TABS: 40.0000 mg | ORAL_TABLET | Freq: Every day | ORAL | Status: DC

## 2012-06-02 NOTE — Telephone Encounter (Signed)
Pls let pt know that her cholesterol is very high.  Much higher than the last time we checked.  I would like her to start a cholesterol medication, work hard on low fat/low cholesterol diet and get regular exercise.  She should return to the lab in 6 weeks for FLP and LFT. Call if unusual muscle pain develops while on this medication.

## 2012-06-02 NOTE — Telephone Encounter (Signed)
Notified pt and she voices understanding. Future lab order placed. Copy given to the lab and mailed to pt. 

## 2012-06-09 ENCOUNTER — Encounter: Payer: Self-pay | Admitting: Family

## 2012-06-09 ENCOUNTER — Ambulatory Visit (INDEPENDENT_AMBULATORY_CARE_PROVIDER_SITE_OTHER): Payer: Medicare Other | Admitting: Family

## 2012-06-09 VITALS — BP 162/100 | HR 66 | Temp 99.0°F | Resp 16 | Wt 217.1 lb

## 2012-06-09 DIAGNOSIS — N6009 Solitary cyst of unspecified breast: Secondary | ICD-10-CM

## 2012-06-09 MED ORDER — CEPHALEXIN 500 MG PO CAPS: 500.0000 mg | ORAL_CAPSULE | Freq: Four times a day (QID) | ORAL | Status: DC

## 2012-06-09 NOTE — Assessment & Plan Note (Signed)
Small. Will rx with keflex.  Follow up in 1 week.  If resolved with send for routine screening mammogram.

## 2012-06-09 NOTE — Patient Instructions (Addendum)
Please follow up in 1 week. Call if fever, increased pain, swelling or fever.

## 2012-06-09 NOTE — Progress Notes (Signed)
Subjective:    Patient ID: [name], female    DOB: January 07, 1961, 51 y.o.   MRN: [id number]  HPI  [name] is a 51 yr old female who presents today with chief complaint of breast pain.  Pain is mainly in the right breast.  Started yesterday.  She is having trouble sleeping due to the pain.  She denies swelling, thinks she might have a little redness on the right breast. She denies drainage of fever.    HTN- took meds, attributes elevated bp to pain.   Review of Systems See HPI  Past Medical History  Diagnosis Date  . Atypical chest pain   . Hyperlipidemia   . Hypertension   . GERD (gastroesophageal reflux disease)   . Diabetes mellitus, type II   . Constipation   . Routine general medical examination at a health care facility   . Gastroparesis   . Iron deficiency anemia   . Esophageal stricture   . Migraine headache   . Endometriosis   . Cyst, ovarian   . Peptic ulcer disease   . Non-compliance   . Allergy     allergic rhinitis    History   Social History  . Marital Status: Married    Spouse Name: N/A    Number of Children: 2  . Years of Education: N/A   Occupational History  . DISABILITY    Social History Main Topics  . Smoking status: Former Smoker    Quit date: 06/25/1994  . Smokeless tobacco: Never Used     Comment: Quit in 1996  . Alcohol Use: Yes     Comment: soical drinker  . Drug Use: No  . Sexually Active: Not on file   Other Topics Concern  . Not on file   Social History Narrative   Married Has 2 grown children.  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  She was a pack a day smoker for approximately 10 years.Alcohol use-yes: Social Daily Caffeine Use:6 pack of pepsi daily  Illicit Drug Use - no Patient does not get regular exercise.    Smoking Status:  quit    Past Surgical History  Procedure Date  . Abdominal exploration surgery     w/bso   . Knee surgery 2005     left knee  . Total abdominal  hysterectomy   . Cholecystectomy   . Cardiac catheterization 2009    mild non obstructive CAD    Family History  Problem Relation Age of Onset  . Hypertension Mother   . Hypertension Father   . Diabetes Father   . Prostate cancer Father   . Kidney disease Father   . Cancer Father     prostate cancer  . Diabetes Sister   . Diabetes      grandmother  . Other      no FH of colon cancer,  . Lung cancer Maternal Grandfather     Allergies  Allergen Reactions  . Ace Inhibitors     REACTION: chronic cough  . Celebrex (Celecoxib) Other (See Comments)    "makes me bleed"    Current Outpatient Prescriptions on File Prior to Visit  Medication Sig Dispense Refill  . atorvastatin (LIPITOR) 40 MG tablet Take 1 tablet (40 mg total) by mouth daily.  30 tablet  2  . cyclobenzaprine (FLEXERIL) 10 MG tablet Take 1 tablet (10 mg total) by mouth as needed.  30 tablet  0  . dicyclomine (BENTYL) 10  MG capsule Take 10 mg by mouth Once daily as needed.      . gabapentin (NEURONTIN) 100 MG capsule Take 100 mg by mouth Three times a day.      . polyethylene glycol powder (MIRALAX) powder Take 17 g by mouth daily. For indigestion      . traMADol (ULTRAM) 50 MG tablet Take 1 tablet (50 mg total) by mouth every 8 (eight) hours as needed.  30 tablet  0  . valsartan-hydrochlorothiazide (DIOVAN-HCT) 160-12.5 MG per tablet Take 1 tablet by mouth daily.  30 tablet  3    BP 162/100  Pulse 66  Temp 99 F (37.2 C) (Oral)  Resp 16  Wt 217 lb 1.3 oz (98.467 kg)  SpO2 99%       Objective:   Physical Exam  Constitutional: She appears well-developed.  Genitourinary:       Right breast superficial small tender abscess <pea sized near surface of skin.  Otherwise right breast and  Left breast exam is unremarkable.           Assessment & Plan:

## 2012-06-13 ENCOUNTER — Ambulatory Visit: Payer: Medicare Other | Admitting: Medical

## 2012-06-13 ENCOUNTER — Other Ambulatory Visit: Payer: Medicare Other | Admitting: Lab

## 2012-06-16 ENCOUNTER — Ambulatory Visit (INDEPENDENT_AMBULATORY_CARE_PROVIDER_SITE_OTHER): Payer: Medicare Other | Admitting: Family

## 2012-06-16 ENCOUNTER — Encounter: Payer: Self-pay | Admitting: Family

## 2012-06-16 ENCOUNTER — Ambulatory Visit (HOSPITAL_BASED_OUTPATIENT_CLINIC_OR_DEPARTMENT_OTHER)
Admission: RE | Admit: 2012-06-16 | Discharge: 2012-06-16 | Disposition: A | Discharge status: Home / Self Care | Payer: Medicare Other | Source: Ambulatory Visit | Attending: Family | Admitting: Family

## 2012-06-16 ENCOUNTER — Other Ambulatory Visit: Payer: Self-pay | Admitting: Family

## 2012-06-16 VITALS — BP 132/84 | HR 59 | Temp 98.8°F | Resp 16 | Ht 64.0 in | Wt 220.0 lb

## 2012-06-16 DIAGNOSIS — N6009 Solitary cyst of unspecified breast: Secondary | ICD-10-CM

## 2012-06-16 DIAGNOSIS — Z1231 Encounter for screening mammogram for malignant neoplasm of breast: Secondary | ICD-10-CM | POA: Insufficient documentation

## 2012-06-16 DIAGNOSIS — R071 Chest pain on breathing: Secondary | ICD-10-CM

## 2012-06-16 DIAGNOSIS — Z0000 Encounter for general adult medical examination without abnormal findings: Secondary | ICD-10-CM

## 2012-06-16 DIAGNOSIS — R0789 Other chest pain: Secondary | ICD-10-CM

## 2012-06-16 NOTE — Assessment & Plan Note (Signed)
Likely mild costrochondritis. Recommended tylenol prn.  She is unable to take NSAIDS.

## 2012-06-16 NOTE — Progress Notes (Signed)
Subjective:    Patient ID: [name], female    DOB: Jul 21, 1960, 51 y.o.   MRN: [id number]  HPI  [name] is a 51 yr old female who presents today for follow up of her right breast cyst. She reports continued tenderness. Did not pick up abx.  She reports that cyst has resolved.  Both breasts are sore.     Review of Systems    see HPI  Past Medical History  Diagnosis Date  . Atypical chest pain   . Hyperlipidemia   . Hypertension   . GERD (gastroesophageal reflux disease)   . Diabetes mellitus, type II   . Constipation   . Routine general medical examination at a health care facility   . Gastroparesis   . Iron deficiency anemia   . Esophageal stricture   . Migraine headache   . Endometriosis   . Cyst, ovarian   . Peptic ulcer disease   . Non-compliance   . Allergy     allergic rhinitis    History   Social History  . Marital Status: Married    Spouse Name: N/A    Number of Children: 2  . Years of Education: N/A   Occupational History  . DISABILITY    Social History Main Topics  . Smoking status: Former Smoker    Quit date: 06/25/1994  . Smokeless tobacco: Never Used     Comment: Quit in 1996  . Alcohol Use: Yes     Comment: soical drinker  . Drug Use: No  . Sexually Active: Not on file   Other Topics Concern  . Not on file   Social History Narrative   Married Has 2 grown children.  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  She was a pack a day smoker for approximately 10 years.Alcohol use-yes: Social Daily Caffeine Use:6 pack of pepsi daily  Illicit Drug Use - no Patient does not get regular exercise.    Smoking Status:  quit    Past Surgical History  Procedure Date  . Abdominal exploration surgery     w/bso   . Knee surgery 2005     left knee  . Total abdominal hysterectomy   . Cholecystectomy   . Cardiac catheterization 2009    mild non obstructive CAD    Family History  Problem Relation Age of Onset   . Hypertension Mother   . Hypertension Father   . Diabetes Father   . Prostate cancer Father   . Kidney disease Father   . Cancer Father     prostate cancer  . Diabetes Sister   . Diabetes      grandmother  . Other      no FH of colon cancer,  . Lung cancer Maternal Grandfather     Allergies  Allergen Reactions  . Ace Inhibitors     REACTION: chronic cough  . Celebrex (Celecoxib) Other (See Comments)    "makes me bleed"    Current Outpatient Prescriptions on File Prior to Visit  Medication Sig Dispense Refill  . atorvastatin (LIPITOR) 40 MG tablet Take 1 tablet (40 mg total) by mouth daily.  30 tablet  2  . cyclobenzaprine (FLEXERIL) 10 MG tablet Take 1 tablet (10 mg total) by mouth as needed.  30 tablet  0  . dicyclomine (BENTYL) 10 MG capsule Take 10 mg by mouth Once daily as needed.      . gabapentin (NEURONTIN) 100 MG capsule Take 100  mg by mouth Three times a day.      . polyethylene glycol powder (MIRALAX) powder Take 17 g by mouth daily. For indigestion      . traMADol (ULTRAM) 50 MG tablet Take 1 tablet (50 mg total) by mouth every 8 (eight) hours as needed.  30 tablet  0  . valsartan-hydrochlorothiazide (DIOVAN-HCT) 160-12.5 MG per tablet Take 1 tablet by mouth daily.  30 tablet  3  . cephALEXin (KEFLEX) 500 MG capsule Take 1 capsule (500 mg total) by mouth 4 (four) times daily.  28 capsule  0    BP 132/84  Pulse 59  Temp 98.8 F (37.1 C) (Oral)  Resp 16  Ht 5\' 4"  (1.626 m)  Wt 220 lb (99.791 kg)  BMI 37.76 kg/m2  SpO2 99%    Objective:   Physical Exam  Constitutional: She appears well-developed and well-nourished. No distress.  Cardiovascular:  No murmur heard. Genitourinary:       Normal breast exam bilaterally. Small firm < pea sized superficial skin cyst noted right breast.   Musculoskeletal:       + anterior wall chest tenderness.           Assessment & Plan:

## 2012-06-16 NOTE — Assessment & Plan Note (Signed)
Improved. She has not taken abx.  Monitor.  She is due for screening mammogram- will order.

## 2012-06-16 NOTE — Patient Instructions (Addendum)
Please schedule your mammogram on the first floor.   Please schedule a follow up appointment in 3 months.  

## 2012-07-13 ENCOUNTER — Encounter (HOSPITAL_BASED_OUTPATIENT_CLINIC_OR_DEPARTMENT_OTHER): Payer: Self-pay | Admitting: *Deleted

## 2012-07-13 ENCOUNTER — Emergency Department (HOSPITAL_BASED_OUTPATIENT_CLINIC_OR_DEPARTMENT_OTHER)
Admission: EM | Admit: 2012-07-13 | Discharge: 2012-07-13 | Disposition: A | Discharge status: Home / Self Care | Payer: Medicare PPO | Attending: Emergency Medicine | Admitting: Emergency Medicine

## 2012-07-13 DIAGNOSIS — Z79899 Other long term (current) drug therapy: Secondary | ICD-10-CM | POA: Insufficient documentation

## 2012-07-13 DIAGNOSIS — Z862 Personal history of diseases of the blood and blood-forming organs and certain disorders involving the immune mechanism: Secondary | ICD-10-CM | POA: Insufficient documentation

## 2012-07-13 DIAGNOSIS — K219 Gastro-esophageal reflux disease without esophagitis: Secondary | ICD-10-CM | POA: Insufficient documentation

## 2012-07-13 DIAGNOSIS — G43909 Migraine, unspecified, not intractable, without status migrainosus: Secondary | ICD-10-CM | POA: Insufficient documentation

## 2012-07-13 DIAGNOSIS — R5383 Other fatigue: Secondary | ICD-10-CM | POA: Insufficient documentation

## 2012-07-13 DIAGNOSIS — Z8719 Personal history of other diseases of the digestive system: Secondary | ICD-10-CM | POA: Insufficient documentation

## 2012-07-13 DIAGNOSIS — Z87891 Personal history of nicotine dependence: Secondary | ICD-10-CM | POA: Insufficient documentation

## 2012-07-13 DIAGNOSIS — Z8711 Personal history of peptic ulcer disease: Secondary | ICD-10-CM | POA: Insufficient documentation

## 2012-07-13 DIAGNOSIS — R1110 Vomiting, unspecified: Secondary | ICD-10-CM | POA: Insufficient documentation

## 2012-07-13 DIAGNOSIS — E119 Type 2 diabetes mellitus without complications: Secondary | ICD-10-CM | POA: Insufficient documentation

## 2012-07-13 DIAGNOSIS — E785 Hyperlipidemia, unspecified: Secondary | ICD-10-CM | POA: Insufficient documentation

## 2012-07-13 DIAGNOSIS — R5381 Other malaise: Secondary | ICD-10-CM | POA: Insufficient documentation

## 2012-07-13 DIAGNOSIS — Z8742 Personal history of other diseases of the female genital tract: Secondary | ICD-10-CM | POA: Insufficient documentation

## 2012-07-13 DIAGNOSIS — H53149 Visual discomfort, unspecified: Secondary | ICD-10-CM | POA: Insufficient documentation

## 2012-07-13 DIAGNOSIS — I10 Essential (primary) hypertension: Secondary | ICD-10-CM | POA: Insufficient documentation

## 2012-07-13 MED ORDER — HYDROCODONE-ACETAMINOPHEN 5-500 MG PO TABS: 1.0000 | ORAL_TABLET | Freq: Four times a day (QID) | ORAL | Status: AC | PRN

## 2012-07-13 MED ORDER — KETOROLAC TROMETHAMINE 30 MG/ML IJ SOLN
30.0000 mg | Freq: Once | INTRAMUSCULAR | Status: AC
  Administered 2012-07-13: 30 mg via INTRAVENOUS
  Filled 2012-07-13: qty 1

## 2012-07-13 MED ORDER — METOCLOPRAMIDE HCL 5 MG/ML IJ SOLN
10.0000 mg | Freq: Once | INTRAMUSCULAR | Status: AC
  Administered 2012-07-13: 10 mg via INTRAVENOUS
  Filled 2012-07-13: qty 2

## 2012-07-13 MED ORDER — METHYLPREDNISOLONE SODIUM SUCC 125 MG IJ SOLR
125.0000 mg | Freq: Once | INTRAMUSCULAR | Status: AC
  Administered 2012-07-13: 125 mg via INTRAVENOUS
  Filled 2012-07-13: qty 2

## 2012-07-13 MED ORDER — SODIUM CHLORIDE 0.9 % IV SOLN
Freq: Once | INTRAVENOUS | Status: AC
  Administered 2012-07-13: 21:00:00 via INTRAVENOUS

## 2012-07-13 MED ORDER — ONDANSETRON HCL 4 MG/2ML IJ SOLN
4.0000 mg | Freq: Once | INTRAMUSCULAR | Status: AC
  Administered 2012-07-13: 4 mg via INTRAVENOUS
  Filled 2012-07-13: qty 2

## 2012-07-13 NOTE — ED Notes (Signed)
C/o "migraine' headache that started today at 2pm. C/o frontal h/a. C/o light sensivity. C/o n/v. States hx of migraines.

## 2012-07-13 NOTE — ED Provider Notes (Signed)
History   This chart was scribed for Denise Lyons, MD by [name], ED Scribe. The patient was seen in room MH11/MH11 and the patient's care was started at 8:46PM.    CSN: [id number]  Arrival date & time 07/13/12  4098   First MD Initiated Contact with Patient 07/13/12 2043      Chief Complaint  Patient presents with  . Migraine    (Consider location/radiation/quality/duration/timing/severity/associated sxs/prior treatment) The history is provided by the patient. No language interpreter was used.   [name] is a 52 y.o. female who presents to the Emergency Department complaining of constant, moderate to severe headache with an onset 2:00PM this afternoon. She reports to have a history of migraines. She reports she was vomiting today and was unable to take pain meds PO. She normally comes to the ED for a pain injection when she has a migraine. She said that there was nothing unusual from the headache compared to past migraines. She has not been around any sick individuals at home. She reports generalized weakness and mild photophobia. Denies fever, neck pain, sore throat, rash, back pain, CP, SOB, abdominal pain, diarrhea, dysuria, or extremity pain, edema, numbness, or tingling. She drove here to the ED tonight. Allergic to Celebrex. No other pertinent medical symptoms.  Past Medical History  Diagnosis Date  . Atypical chest pain   . Hyperlipidemia   . Hypertension   . GERD (gastroesophageal reflux disease)   . Diabetes mellitus, type II   . Constipation   . Routine general medical examination at a health care facility   . Gastroparesis   . Iron deficiency anemia   . Esophageal stricture   . Migraine headache   . Endometriosis   . Cyst, ovarian   . Peptic ulcer disease   . Non-compliance   . Allergy     allergic rhinitis    Past Surgical History  Procedure Date  . Abdominal exploration surgery     w/bso   . Knee surgery 2005     left knee  . Total  abdominal hysterectomy   . Cholecystectomy   . Cardiac catheterization 2009    mild non obstructive CAD    Family History  Problem Relation Age of Onset  . Hypertension Mother   . Hypertension Father   . Diabetes Father   . Prostate cancer Father   . Kidney disease Father   . Cancer Father     prostate cancer  . Diabetes Sister   . Diabetes      grandmother  . Other      no FH of colon cancer,  . Lung cancer Maternal Grandfather     History  Substance Use Topics  . Smoking status: Former Smoker    Quit date: 06/25/1994  . Smokeless tobacco: Never Used     Comment: Quit in 1996  . Alcohol Use: Yes     Comment: soical drinker    OB History    Grav Para Term Preterm Abortions TAB SAB Ect Mult Living                  Review of Systems 10 Systems reviewed and all are negative for acute change except as noted in the HPI.   Allergies  Ace inhibitors and Celebrex  Home Medications   Current Outpatient Rx  Name  Route  Sig  Dispense  Refill  . ATORVASTATIN CALCIUM 40 MG PO TABS   Oral   Take 1 tablet (40 mg  total) by mouth daily.   30 tablet   2   . CYCLOBENZAPRINE HCL 10 MG PO TABS   Oral   Take 1 tablet (10 mg total) by mouth as needed.   30 tablet   0   . DICYCLOMINE HCL 10 MG PO CAPS   Oral   Take 10 mg by mouth Once daily as needed.         Marland Kitchen GABAPENTIN 100 MG PO CAPS   Oral   Take 100 mg by mouth Three times a day.         Marland Kitchen POLYETHYLENE GLYCOL 3350 PO POWD   Oral   Take 17 g by mouth daily. For indigestion         . TRAMADOL HCL 50 MG PO TABS   Oral   Take 1 tablet (50 mg total) by mouth every 8 (eight) hours as needed.   30 tablet   0   . VALSARTAN-HYDROCHLOROTHIAZIDE 160-12.5 MG PO TABS   Oral   Take 1 tablet by mouth daily.   30 tablet   3     BP 182/98  Pulse 67  Temp 98.3 F (36.8 C)  Resp 18  Ht 5\' 4"  (1.626 m)  Wt 210 lb (95.255 kg)  BMI 36.05 kg/m2  SpO2 100%  Physical Exam  Nursing note and vitals  reviewed. Constitutional: She is oriented to person, place, and time. She appears well-developed and well-nourished.       Awake, alert, nontoxic appearance.  HENT:  Head: Atraumatic.  Eyes: EOM are normal. Pupils are equal, round, and reactive to light. Right eye exhibits no discharge. Left eye exhibits no discharge.       No papilledema.  Neck: Normal range of motion. Neck supple.  Cardiovascular: Normal rate, regular rhythm and normal heart sounds.   No murmur heard. Pulmonary/Chest: Effort normal and breath sounds normal. No respiratory distress. She exhibits no tenderness.  Abdominal: Soft. Bowel sounds are normal. There is no tenderness. There is no rebound.  Musculoskeletal: She exhibits no tenderness.       Baseline ROM, no obvious new focal weakness.  Neurological: She is alert and oriented to person, place, and time. No cranial nerve deficit.       Mental status and motor strength appears baseline for patient and situation.  Skin: No rash noted.  Psychiatric: She has a normal mood and affect.    ED Course  Procedures (including critical care time)  DIAGNOSTIC STUDIES: Oxygen Saturation is 100% on room air, normal by my interpretation.    COORDINATION OF CARE:  8:51PM - past medical history reviewed; she does not have any extensive history of coming to the ED for her headaches. IV fluids, toradol, reglan, solu-medrol, and zofran will be ordered for [name].    Labs Reviewed - No data to display No results found.   No diagnosis found.    MDM  Feels better with meds given.  Doubt an emergent cause of this.  No indication for ct or LP.  Will discharge to home with a few vicodin, return prn.      I personally performed the services described in this documentation, which was scribed in my presence. The recorded information has been reviewed and is accurate.          Denise Lyons, MD 07/13/12 2218

## 2012-07-18 ENCOUNTER — Other Ambulatory Visit: Payer: Medicare Other | Admitting: Lab

## 2012-07-18 ENCOUNTER — Other Ambulatory Visit: Payer: Self-pay | Admitting: Hematology & Oncology

## 2012-07-18 ENCOUNTER — Ambulatory Visit (HOSPITAL_BASED_OUTPATIENT_CLINIC_OR_DEPARTMENT_OTHER): Payer: Medicare Other | Admitting: Medical

## 2012-07-18 DIAGNOSIS — D509 Iron deficiency anemia, unspecified: Secondary | ICD-10-CM

## 2012-07-18 LAB — CBC WITH DIFFERENTIAL (CANCER CENTER ONLY)
BASO#: 0 10*3/uL (ref 0.0–0.2)
BASO%: 0.3 % (ref 0.0–2.0)
EOS%: 1.3 % (ref 0.0–7.0)
Eosinophils Absolute: 0.1 10*3/uL (ref 0.0–0.5)
HCT: 36.7 % (ref 34.8–46.6)
HGB: 11.3 g/dL — ABNORMAL LOW (ref 11.6–15.9)
LYMPH#: 2.4 10*3/uL (ref 0.9–3.3)
LYMPH%: 38.9 % (ref 14.0–48.0)
MCH: 22.8 pg — ABNORMAL LOW (ref 26.0–34.0)
MCHC: 30.8 g/dL — ABNORMAL LOW (ref 32.0–36.0)
MCV: 74 fL — ABNORMAL LOW (ref 81–101)
MONO#: 0.4 10*3/uL (ref 0.1–0.9)
MONO%: 7.2 % (ref 0.0–13.0)
NEUT#: 3.2 10*3/uL (ref 1.5–6.5)
NEUT%: 52.3 % (ref 39.6–80.0)
Platelets: 221 10*3/uL (ref 145–400)
RBC: 4.95 10*6/uL (ref 3.70–5.32)
RDW: 14 % (ref 11.1–15.7)
WBC: 6.1 10*3/uL (ref 3.9–10.0)

## 2012-07-18 LAB — IRON AND TIBC
%SAT: 22 % (ref 20–55)
Iron: 62 ug/dL (ref 42–145)
TIBC: 280 ug/dL (ref 250–470)
UIBC: 218 ug/dL (ref 125–400)

## 2012-07-18 LAB — FERRITIN: Ferritin: 822 ng/mL — ABNORMAL HIGH (ref 10–291)

## 2012-07-18 NOTE — Progress Notes (Signed)
Diagnosis: Recurrent iron deficiency anemia.  Current therapy: IV iron as indicated.  Interim history: Ms. Micallef comes in today for an office followup visit.  Overall, she, reports, that she's been doing quite well.  She does continue to feel little bit fatigued.  The last, time, she had a dose of IV iron was back in September.  Her last iron panel.  Back in November revealed a ferritin of 1045, iron 34% saturation.  She does have some chronic joint pain.  She does have occasional migraines.  She reports, that her appetite is decent.  She denies any nausea, vomiting, diarrhea, constipation.  Any cough, chest pain, shortness of breath.  Any fevers, chills, or night sweats.  In any adenopathy.  She denies any headaches, visual changes, or rashes.  She denies any obvious, or abnormal bleeding or bruising.  She denies any lower leg swelling.  She denies craving any ice.  She could possibly need another dose of IV iron.  Her hemoglobin has dropped some and her MCV is low.  We will wait and iron panel.  Review of Systems: Constitutional:Negative for malaise/fatigue, fever, chills, weight loss, diaphoresis, activity change, appetite change, and unexpected weight change.  HEENT: Negative for double vision, blurred vision, visual loss, ear pain, tinnitus, congestion, rhinorrhea, epistaxis sore throat or sinus disease, oral pain/lesion, tongue soreness Respiratory: Negative for cough, chest tightness, shortness of breath, wheezing and stridor.  Cardiovascular: Negative for chest pain, palpitations, leg swelling, orthopnea, PND, DOE or claudication Gastrointestinal: Negative for nausea, vomiting, abdominal pain, diarrhea, constipation, blood in stool, melena, hematochezia, abdominal distention, anal bleeding, rectal pain, anorexia and hematemesis.  Genitourinary: Negative for dysuria, frequency, hematuria,  Musculoskeletal: Negative for myalgias, back pain, joint swelling, arthralgias and gait problem.  Skin:  Negative for rash, color change, pallor and wound.  Neurological:. Negative for dizziness/light-headedness, tremors, seizures, syncope, facial asymmetry, speech difficulty, weakness, numbness, headaches and paresthesias.  Hematological: Negative for adenopathy. Does not bruise/bleed easily.  Psychiatric/Behavioral:  Negative for depression, no loss of interest in normal activity or change in sleep pattern.   Physical Exam: This is a mildly obese, 51 year old, African American female, in no obvious distress Vitals: Temperature 98.0 degrees.  Pulse 71, respirations 18, blood pressure 120/75.  Weight 222 pounds HEENT reveals a normocephalic, atraumatic skull, no scleral icterus, no oral lesions  Neck is supple without any cervical or supraclavicular adenopathy.  Lungs are clear to auscultation bilaterally. There are no wheezes, rales or rhonci Cardiac is regular rate and rhythm with a normal S1 and S2. There are no murmurs, rubs, or bruits.  Abdomen is soft with good bowel sounds, there is no palpable mass. There is no palpable hepatosplenomegaly. There is no palpable fluid wave.  Musculoskeletal no tenderness of the spine, ribs, or hips.  Extremities there are no clubbing, cyanosis, or edema.  Skin no petechia, purpura or ecchymosis Neurologic is nonfocal.  Laboratory Data: White count 6.1, hemoglobin 11.3, hematocrit 36.7, MCV 74, platelets 221,000  Current Outpatient Prescriptions on File Prior to Visit  Medication Sig Dispense Refill  . atorvastatin (LIPITOR) 40 MG tablet Take 1 tablet (40 mg total) by mouth daily.  30 tablet  2  . cyclobenzaprine (FLEXERIL) 10 MG tablet Take 1 tablet (10 mg total) by mouth as needed.  30 tablet  0  . dicyclomine (BENTYL) 10 MG capsule Take 10 mg by mouth Once daily as needed.      . gabapentin (NEURONTIN) 100 MG capsule Take 100 mg by mouth Three times  a day.      Marland Kitchen HYDROcodone-acetaminophen (VICODIN) 5-500 MG per tablet Take 1-2 tablets by mouth every 6  (six) hours as needed for pain.  10 tablet  0  . polyethylene glycol powder (MIRALAX) powder Take 17 g by mouth daily. For indigestion      . traMADol (ULTRAM) 50 MG tablet Take 1 tablet (50 mg total) by mouth every 8 (eight) hours as needed.  30 tablet  0  . valsartan-hydrochlorothiazide (DIOVAN-HCT) 160-12.5 MG per tablet Take 1 tablet by mouth daily.  30 tablet  3   Assessment/Plan: Ms. Jeanella Craze is a 52 year old F. American female, with the following issues:  #1 recurrent iron deficiency anemia.  Her last dose of IV iron was back in September.  We will await her iron panel from today.  I suspect, that she may need a dose of IV iron.  If her iron is low, we will call her to come in.  We will continue to monitor her closely.   #2 followup.  We will follow back up with Ms. Heeg in 2 months, but before then should there be questions or concerns.

## 2012-07-21 ENCOUNTER — Other Ambulatory Visit: Payer: Self-pay

## 2012-07-21 ENCOUNTER — Telehealth: Payer: Self-pay | Admitting: Gastroenterology

## 2012-07-21 ENCOUNTER — Telehealth: Payer: Self-pay

## 2012-07-21 DIAGNOSIS — D509 Iron deficiency anemia, unspecified: Secondary | ICD-10-CM

## 2012-07-21 NOTE — Telephone Encounter (Addendum)
Message copied by Leana Gamer on Mon Jul 21, 2012  3:51 PM ------      Message from: Arlan Organ R      Created: Sun Jul 20, 2012  4:26 PM       Call - iron is dropping. Need 1 dose of Feraheme at 1020mg .  Please set up!!  Pete Call pt and left message on voice mail to call back and set up appointment for iron infusion.

## 2012-07-21 NOTE — Telephone Encounter (Signed)
Left message for pt to call back  °

## 2012-07-22 ENCOUNTER — Telehealth: Payer: Self-pay | Admitting: Hematology & Oncology

## 2012-07-22 NOTE — Telephone Encounter (Signed)
Pt made 1-31 iron infusion appointment

## 2012-07-24 NOTE — Telephone Encounter (Signed)
Pt states that when she eats it feels like the food is trying to come back up. She also states that when she burps it has an odor like "boiled eggs." Pt scheduled to see Doug Sou PA tomorrow at 10:45am. Pt aware of appt date and time.

## 2012-07-25 ENCOUNTER — Encounter: Payer: Self-pay | Admitting: Gastroenterology

## 2012-07-25 ENCOUNTER — Telehealth: Payer: Self-pay | Admitting: Gastroenterology

## 2012-07-25 ENCOUNTER — Ambulatory Visit (INDEPENDENT_AMBULATORY_CARE_PROVIDER_SITE_OTHER): Payer: Medicare Other | Admitting: Gastroenterology

## 2012-07-25 ENCOUNTER — Ambulatory Visit (HOSPITAL_BASED_OUTPATIENT_CLINIC_OR_DEPARTMENT_OTHER): Payer: Medicare PPO

## 2012-07-25 VITALS — BP 156/89 | HR 78 | Temp 98.0°F | Resp 20

## 2012-07-25 VITALS — BP 160/98 | HR 76 | Ht 64.0 in | Wt 218.4 lb

## 2012-07-25 DIAGNOSIS — D509 Iron deficiency anemia, unspecified: Secondary | ICD-10-CM

## 2012-07-25 DIAGNOSIS — R1011 Right upper quadrant pain: Secondary | ICD-10-CM

## 2012-07-25 DIAGNOSIS — K219 Gastro-esophageal reflux disease without esophagitis: Secondary | ICD-10-CM

## 2012-07-25 DIAGNOSIS — R112 Nausea with vomiting, unspecified: Secondary | ICD-10-CM

## 2012-07-25 DIAGNOSIS — G8929 Other chronic pain: Secondary | ICD-10-CM

## 2012-07-25 DIAGNOSIS — R1012 Left upper quadrant pain: Secondary | ICD-10-CM

## 2012-07-25 MED ORDER — DEXLANSOPRAZOLE 60 MG PO CPDR: 60.0000 mg | DELAYED_RELEASE_CAPSULE | Freq: Every day | ORAL | Status: DC

## 2012-07-25 MED ORDER — SODIUM CHLORIDE 0.9 % IV SOLN: 1020.0000 mg | Freq: Once | INTRAVENOUS | Status: DC

## 2012-07-25 MED ORDER — SODIUM CHLORIDE 0.9 % IV SOLN
Freq: Once | INTRAVENOUS | Status: AC
  Administered 2012-07-25: 14:00:00 via INTRAVENOUS

## 2012-07-25 MED ORDER — SODIUM CHLORIDE 0.9 % IV SOLN
1020.0000 mg | Freq: Once | INTRAVENOUS | Status: AC
  Administered 2012-07-25: 1020 mg via INTRAVENOUS
  Filled 2012-07-25: qty 34

## 2012-07-25 NOTE — Progress Notes (Signed)
07/25/2012 [name] [id number] March 20, 1961   History of Present Illness: Patient is a 52 year old female who is a patient of Dr. Marzetta Board.  She has been seen multiple times in the past for complaints of upper abdominal pain and nausea/vomiting.  Comes in again today with the same complaints that started again about a week ago.  Having heartburn and reflux coming up as well.  In 2010 she had a GES that showed marked delayed gastric emptying and was tried on Reglan, erythromycin, and domperidone.  Repeat GES in 2011 was normal, however.  She says that she has taken Nexium and protonix in the past without relief, but it does not seem that she is very compliant with her medications.  EGD 04/2009 with mild gastritis at that time.  CT abdomen and pelvis 09/2011 showed no new or acute abnormalities.  Recent CBC and CMP unremarkable.  At her last visit she was referred to [facility] to see Dr. Claire Shown, but she is not very knowledgeable about what occurred there and I cannot find any records at this time.   Current Medications, Allergies, Past Medical History, Past Surgical History, Family History and Social History were reviewed in [employer] record.   Physical Exam: BP 160/98  Pulse 76  Ht 5\' 4"  (1.626 m)  Wt 218 lb 6.4 oz (99.066 kg)  BMI 37.49 kg/m2 General: Well developed, black female in no acute distress Head: Normocephalic and atraumatic Eyes:  sclerae anicteric, conjunctiva pink  Ears: Normal auditory acuity Lungs: Clear throughout to auscultation Heart: Regular rate and rhythm Abdomen: Soft, non-distended. No masses, no hepatomegaly. Normal bowel sounds.  Mild upper abdominal TTP without R/R/G. Musculoskeletal: Symmetrical with no gross deformities  Extremities: No edema  Neurological: Alert oriented x 4, grossly nonfocal  Assessment and Recommendations: -Nausea/vomiting and upper abdominal pain with heartburn and reflux.  These symptoms have been ongoing for quite  some time and she has been seen on several occasions for this with extensive testing.  She is non-compiant with medications and seems to only taking medications a few times before deeming that they are not helpful.  Will start her on Dexilant 60 mg daily, which she is to take every day for at least the next four weeks.  GERD dietary instructions.  Follow-up 4-6 weeks.  Will attempt to get records from [facility] to review at her follow-up visit.

## 2012-07-25 NOTE — Telephone Encounter (Signed)
Requested notes from pt's referral with Dr. Alycia Rossetti

## 2012-07-25 NOTE — Patient Instructions (Addendum)
Ferumoxytol injection What is this medicine? FERUMOXYTOL is an iron complex. Iron is used to make healthy red blood cells, which carry oxygen and nutrients throughout the body. This medicine is used to treat iron deficiency anemia in people with chronic kidney disease. This medicine may be used for other purposes; ask your health care provider or pharmacist if you have questions. What should I tell my health care provider before I take this medicine? They need to know if you have any of these conditions: -anemia not caused by low iron levels -high levels of iron in the blood -magnetic resonance imaging (MRI) test scheduled -an unusual or allergic reaction to iron, other medicines, foods, dyes, or preservatives -pregnant or trying to get pregnant -breast-feeding How should I use this medicine? This medicine is for infusion into a vein. It is given by a health care professional in a hospital or clinic setting. Talk to your pediatrician regarding the use of this medicine in children. Special care may be needed. Overdosage: If you think you've taken too much of this medicine contact a poison control center or emergency room at once. Overdosage: If you think you have taken too much of this medicine contact a poison control center or emergency room at once. NOTE: This medicine is only for you. Do not share this medicine with others. What if I miss a dose? It is important not to miss your dose. Call your doctor or health care professional if you are unable to keep an appointment. What may interact with this medicine? This medicine may interact with the following medications: -other iron products This list may not describe all possible interactions. Give your health care provider a list of all the medicines, herbs, non-prescription drugs, or dietary supplements you use. Also tell them if you smoke, drink alcohol, or use illegal drugs. Some items may interact with your medicine. What should I watch  for while using this medicine? Visit your doctor or healthcare professional regularly. Tell your doctor or healthcare professional if your symptoms do not start to get better or if they get worse. You may need blood work done while you are taking this medicine. You may need to follow a special diet. Talk to your doctor. Foods that contain iron include: whole grains/cereals, dried fruits, beans, or peas, leafy green vegetables, and organ meats (liver, kidney). What side effects may I notice from receiving this medicine? Side effects that you should report to your doctor or health care professional as soon as possible: -allergic reactions like skin rash, itching or hives, swelling of the face, lips, or tongue -breathing problems -changes in blood pressure -feeling faint or lightheaded, falls -fever or chills -flushing, sweating, or hot feelings -swelling of the ankles or feet Side effects that usually do not require medical attention (Report these to your doctor or health care professional if they continue or are bothersome.): -diarrhea -headache -nausea, vomiting -stomach pain This list may not describe all possible side effects. Call your doctor for medical advice about side effects. You may report side effects to FDA at 1-800-FDA-1088. Where should I keep my medicine? This drug is given in a hospital or clinic and will not be stored at home. NOTE: This sheet is a summary. It may not cover all possible information. If you have questions about this medicine, talk to your doctor, pharmacist, or health care provider.  2012, [employer]vier/Gold Standard. (03/03/2008 9:48:25 PM) 

## 2012-07-25 NOTE — Patient Instructions (Addendum)
We have sent the following medications to your pharmacy for you to pick up at your convenience: Dexilant, please take 1 capsule daily for 4 weeks  Follow up with Dr. Arlyce Dice should symptoms persist   Diet for Gastroesophageal Reflux Disease, Adult Reflux (acid reflux) is when acid from your stomach flows up into the esophagus. When acid comes in contact with the esophagus, the acid causes irritation and soreness (inflammation) in the esophagus. When reflux happens often or so severely that it causes damage to the esophagus, it is called gastroesophageal reflux disease (GERD). Nutrition therapy can help ease the discomfort of GERD. FOODS OR DRINKS TO AVOID OR LIMIT  Smoking or chewing tobacco. Nicotine is one of the most potent stimulants to acid production in the gastrointestinal tract.  Caffeinated and decaffeinated coffee and black tea.  Regular or low-calorie carbonated beverages or energy drinks (caffeine-free carbonated beverages are allowed).   Strong spices, such as black pepper, white pepper, red pepper, cayenne, curry powder, and chili powder.  Peppermint or spearmint.  Chocolate.  High-fat foods, including meats and fried foods. Extra added fats including oils, butter, salad dressings, and nuts. Limit these to less than 8 tsp per day.  Fruits and vegetables if they are not tolerated, such as citrus fruits or tomatoes.  Alcohol.  Any food that seems to aggravate your condition. If you have questions regarding your diet, call your caregiver or a registered dietitian. OTHER THINGS THAT MAY HELP GERD INCLUDE:   Eating your meals slowly, in a relaxed setting.  Eating 5 to 6 small meals per day instead of 3 large meals.  Eliminating food for a period of time if it causes distress.  Not lying down until 3 hours after eating a meal.  Keeping the head of your bed raised 6 to 9 inches (15 to 23 cm) by using a foam wedge or blocks under the legs of the bed. Lying flat may make  symptoms worse.  Being physically active. Weight loss may be helpful in reducing reflux in overweight or obese adults.  Wear loose fitting clothing EXAMPLE MEAL PLAN This meal plan is approximately 2,000 calories based on https://www.bernard.org/ meal planning guidelines. Breakfast   cup cooked oatmeal.  1 cup strawberries.  1 cup low-fat milk.  1 oz almonds. Snack  1 cup cucumber slices.  6 oz yogurt (made from low-fat or fat-free milk). Lunch  2 slice whole-wheat bread.  2 oz sliced [place].  2 tsp mayonnaise.  1 cup blueberries.  1 cup snap peas. Snack  6 whole-wheat crackers.  1 oz string cheese. Dinner   cup brown rice.  1 cup mixed veggies.  1 tsp olive oil.  3 oz grilled fish. Document Released: 06/11/2005 Document Revised: 09/03/2011 Document Reviewed: 04/27/2011 [facility] Patient Information 2013 [place], [place].

## 2012-08-03 NOTE — Progress Notes (Signed)
Reviewed and agree with management. Robert D. Kaplan, M.D., FACG  

## 2012-08-25 ENCOUNTER — Ambulatory Visit: Payer: Medicare Other | Admitting: Family

## 2012-08-26 ENCOUNTER — Ambulatory Visit: Payer: Medicare Other | Admitting: Family

## 2012-09-15 ENCOUNTER — Ambulatory Visit (INDEPENDENT_AMBULATORY_CARE_PROVIDER_SITE_OTHER): Payer: Medicare Other | Admitting: Family

## 2012-09-15 DIAGNOSIS — Z0289 Encounter for other administrative examinations: Secondary | ICD-10-CM

## 2012-09-15 DIAGNOSIS — I10 Essential (primary) hypertension: Secondary | ICD-10-CM

## 2012-09-15 NOTE — Progress Notes (Signed)
  Subjective:    Patient ID: [name], female    DOB: [birth year], 52 y.o.   MRN: [id number]  HPI    Review of Systems     Objective:   Physical Exam        Assessment & Plan:  Opened in error. Pt no showed apt.

## 2012-09-17 ENCOUNTER — Other Ambulatory Visit: Payer: Medicare Other | Admitting: Lab

## 2012-09-17 ENCOUNTER — Ambulatory Visit: Payer: Medicare Other | Admitting: Family

## 2012-09-17 ENCOUNTER — Other Ambulatory Visit (HOSPITAL_BASED_OUTPATIENT_CLINIC_OR_DEPARTMENT_OTHER): Payer: Medicare PPO | Admitting: Lab

## 2012-09-17 ENCOUNTER — Ambulatory Visit: Payer: Medicare Other | Admitting: Hematology & Oncology

## 2012-09-17 ENCOUNTER — Ambulatory Visit (HOSPITAL_BASED_OUTPATIENT_CLINIC_OR_DEPARTMENT_OTHER): Payer: Medicare PPO | Admitting: Medical

## 2012-09-17 VITALS — BP 194/84 | HR 59 | Temp 98.5°F | Resp 16 | Ht 64.0 in | Wt 220.0 lb

## 2012-09-17 DIAGNOSIS — R300 Dysuria: Secondary | ICD-10-CM

## 2012-09-17 DIAGNOSIS — D509 Iron deficiency anemia, unspecified: Secondary | ICD-10-CM

## 2012-09-17 DIAGNOSIS — K5900 Constipation, unspecified: Secondary | ICD-10-CM

## 2012-09-17 LAB — CBC WITH DIFFERENTIAL (CANCER CENTER ONLY)
BASO#: 0 10*3/uL (ref 0.0–0.2)
BASO%: 0.2 % (ref 0.0–2.0)
EOS%: 0.6 % (ref 0.0–7.0)
Eosinophils Absolute: 0 10*3/uL (ref 0.0–0.5)
HCT: 37.7 % (ref 34.8–46.6)
HGB: 11.9 g/dL (ref 11.6–15.9)
LYMPH#: 2.3 10*3/uL (ref 0.9–3.3)
LYMPH%: 34.6 % (ref 14.0–48.0)
MCH: 23.1 pg — ABNORMAL LOW (ref 26.0–34.0)
MCHC: 31.6 g/dL — ABNORMAL LOW (ref 32.0–36.0)
MCV: 73 fL — ABNORMAL LOW (ref 81–101)
MONO#: 0.4 10*3/uL (ref 0.1–0.9)
MONO%: 5.3 % (ref 0.0–13.0)
NEUT#: 3.9 10*3/uL (ref 1.5–6.5)
NEUT%: 59.3 % (ref 39.6–80.0)
Platelets: 197 10*3/uL (ref 145–400)
RBC: 5.16 10*6/uL (ref 3.70–5.32)
RDW: 14.1 % (ref 11.1–15.7)
WBC: 6.6 10*3/uL (ref 3.9–10.0)

## 2012-09-17 LAB — URINALYSIS, MICROSCOPIC (CHCC SATELLITE)
Bacteria, UA: NEGATIVE
Bilirubin (Urine): NEGATIVE
Glucose: NEGATIVE mg/dL
Ketones: NEGATIVE mg/dL
Leukocyte Esterase: NEGATIVE
Nitrite: NEGATIVE
Protein: NEGATIVE mg/dL
Specific Gravity, Urine: 1.03 (ref 1.003–1.035)
Urobilinogen, UR: 0.2 mg/dL (ref 0.2–1)
WBC: NEGATIVE (ref 0–2)
pH: 5 (ref 4.60–8.00)

## 2012-09-17 LAB — FERRITIN: Ferritin: 1174 ng/mL — ABNORMAL HIGH (ref 10–291)

## 2012-09-17 LAB — IRON AND TIBC
%SAT: 30 % (ref 20–55)
Iron: 88 ug/dL (ref 42–145)
TIBC: 298 ug/dL (ref 250–470)
UIBC: 210 ug/dL (ref 125–400)

## 2012-09-17 NOTE — Progress Notes (Signed)
Diagnosis: Recurrent iron deficiency anemia.  Current therapy: IV iron as indicated.  Interim history: [name] comes in today for an office followup visit.  Overall, she, reports, that she's been doing quite well.  She does continue to feel little bit fatigued.  The last, time, she had a dose of IV iron was back on 07/25/2012.Marland Kitchen  Her Iron panel in January revealed a ferritin of 822, iron 62 with 22% saturation.  She does have some chronic joint pain.  She does have occasional migraines.She does report some dysuria, but no hematuria.  We will check her urine today.  She also reports some issues with constipation.  She, reports, that she does have some rectal bleeding, with constipation.  I did advise her to utilize a stool softener.  She otherwise reports, that her appetite is decent.  She denies any nausea, vomiting, diarrhea, constipation.  Any cough, chest pain, shortness of breath.  Any fevers, chills, or night sweats.  In any adenopathy.  She denies any headaches, visual changes, or rashes.  She denies any obvious, or abnormal bleeding or bruising.  She denies any lower leg swelling.  She denies craving any ice.  She could possibly need another dose of IV iron.  Her hemoglobin has dropped some and her MCV is low.  We will wait and iron panel.  Review of Systems: Constitutional:Negative for malaise/fatigue, fever, chills, weight loss, diaphoresis, activity change, appetite change, and unexpected weight change.  HEENT: Negative for double vision, blurred vision, visual loss, ear pain, tinnitus, congestion, rhinorrhea, epistaxis sore throat or sinus disease, oral pain/lesion, tongue soreness Respiratory: Negative for cough, chest tightness, shortness of breath, wheezing and stridor.  Cardiovascular: Negative for chest pain, palpitations, leg swelling, orthopnea, PND, DOE or claudication Gastrointestinal: Negative for nausea, vomiting, abdominal pain, diarrhea, constipation, blood in stool, melena,  hematochezia, abdominal distention, anal bleeding, rectal pain, anorexia and hematemesis.  Genitourinary: Negative for dysuria, frequency, hematuria,  Musculoskeletal: Negative for myalgias, back pain, joint swelling, arthralgias and gait problem.  Skin: Negative for rash, color change, pallor and wound.  Neurological:. Negative for dizziness/light-headedness, tremors, seizures, syncope, facial asymmetry, speech difficulty, weakness, numbness, headaches and paresthesias.  Hematological: Negative for adenopathy. Does not bruise/bleed easily.  Psychiatric/Behavioral:  Negative for depression, no loss of interest in normal activity or change in sleep pattern.   Physical Exam: This is a mildly obese, 52 year old, African American female, in no obvious distress Vitals: Temperature 98.0 degrees, pulse 59, respirations 16, blood pressure 194/84.  Weight 220 pounds HEENT reveals a normocephalic, atraumatic skull, no scleral icterus, no oral lesions  Neck is supple without any cervical or supraclavicular adenopathy.  Lungs are clear to auscultation bilaterally. There are no wheezes, rales or rhonci Cardiac is regular rate and rhythm with a normal S1 and S2. There are no murmurs, rubs, or bruits.  Abdomen is soft with good bowel sounds, there is no palpable mass. There is no palpable hepatosplenomegaly. There is no palpable fluid wave.  Musculoskeletal no tenderness of the spine, ribs, or hips.  Extremities there are no clubbing, cyanosis, or edema.  Skin no petechia, purpura or ecchymosis Neurologic is nonfocal.  Laboratory Data: White count 6.6, hemoglobin 11.9, hematocrit 37.7, MCV 73, platelets 197,000  Current Outpatient Prescriptions on File Prior to Visit  Medication Sig Dispense Refill  . dexlansoprazole (DEXILANT) 60 MG capsule Take 1 capsule (60 mg total) by mouth daily.  90 capsule  3  . dicyclomine (BENTYL) 10 MG capsule Take 10 mg by mouth as  needed.       . gabapentin (NEURONTIN) 100  MG capsule Take 100 mg by mouth Three times a day.      . polyethylene glycol powder (MIRALAX) powder Take 17 g by mouth daily. For indigestion      . traMADol (ULTRAM) 50 MG tablet Take 1 tablet (50 mg total) by mouth every 8 (eight) hours as needed.  30 tablet  0  . valsartan-hydrochlorothiazide (DIOVAN-HCT) 160-12.5 MG per tablet Take 1 tablet by mouth daily.  30 tablet  3   No current facility-administered medications on file prior to visit.   Assessment/Plan: [name] is a 52 year old F. American female, with the following issues:  #1 recurrent iron deficiency anemia.  Her last dose of IV iron was back in January.  We will check an iron panel on her today.   #2.  Dysuria.  We are checking a urine dipstick on her today.  We will provide antibiotics, if needed.  #3.  Constipation.  I did advise her to utilize a stool softener, as well as a laxative.  #4 followup.  We will follow back up with [name] in 3 months, but before then should there be questions or concerns.

## 2012-09-18 ENCOUNTER — Ambulatory Visit (INDEPENDENT_AMBULATORY_CARE_PROVIDER_SITE_OTHER): Payer: Medicare Other | Admitting: Family

## 2012-09-18 ENCOUNTER — Encounter (HOSPITAL_BASED_OUTPATIENT_CLINIC_OR_DEPARTMENT_OTHER): Payer: Self-pay | Admitting: Emergency Medicine

## 2012-09-18 ENCOUNTER — Emergency Department (HOSPITAL_BASED_OUTPATIENT_CLINIC_OR_DEPARTMENT_OTHER)
Admission: EM | Admit: 2012-09-18 | Discharge: 2012-09-18 | Disposition: A | Discharge status: Home / Self Care | Payer: Medicare PPO | Attending: Emergency Medicine | Admitting: Emergency Medicine

## 2012-09-18 ENCOUNTER — Encounter: Payer: Self-pay | Admitting: Family

## 2012-09-18 VITALS — BP 200/104 | HR 65 | Temp 98.2°F | Resp 16 | Wt 218.0 lb

## 2012-09-18 DIAGNOSIS — I10 Essential (primary) hypertension: Secondary | ICD-10-CM | POA: Insufficient documentation

## 2012-09-18 DIAGNOSIS — E119 Type 2 diabetes mellitus without complications: Secondary | ICD-10-CM | POA: Insufficient documentation

## 2012-09-18 DIAGNOSIS — R1013 Epigastric pain: Secondary | ICD-10-CM | POA: Insufficient documentation

## 2012-09-18 DIAGNOSIS — Z8639 Personal history of other endocrine, nutritional and metabolic disease: Secondary | ICD-10-CM | POA: Insufficient documentation

## 2012-09-18 DIAGNOSIS — R109 Unspecified abdominal pain: Secondary | ICD-10-CM

## 2012-09-18 DIAGNOSIS — Z8719 Personal history of other diseases of the digestive system: Secondary | ICD-10-CM | POA: Insufficient documentation

## 2012-09-18 DIAGNOSIS — Z8711 Personal history of peptic ulcer disease: Secondary | ICD-10-CM | POA: Insufficient documentation

## 2012-09-18 DIAGNOSIS — Z79899 Other long term (current) drug therapy: Secondary | ICD-10-CM | POA: Insufficient documentation

## 2012-09-18 DIAGNOSIS — D509 Iron deficiency anemia, unspecified: Secondary | ICD-10-CM | POA: Insufficient documentation

## 2012-09-18 DIAGNOSIS — Z87891 Personal history of nicotine dependence: Secondary | ICD-10-CM | POA: Insufficient documentation

## 2012-09-18 DIAGNOSIS — Z862 Personal history of diseases of the blood and blood-forming organs and certain disorders involving the immune mechanism: Secondary | ICD-10-CM | POA: Insufficient documentation

## 2012-09-18 DIAGNOSIS — R079 Chest pain, unspecified: Secondary | ICD-10-CM | POA: Insufficient documentation

## 2012-09-18 DIAGNOSIS — K219 Gastro-esophageal reflux disease without esophagitis: Secondary | ICD-10-CM | POA: Insufficient documentation

## 2012-09-18 DIAGNOSIS — K3184 Gastroparesis: Secondary | ICD-10-CM

## 2012-09-18 DIAGNOSIS — R0989 Other specified symptoms and signs involving the circulatory and respiratory systems: Secondary | ICD-10-CM | POA: Insufficient documentation

## 2012-09-18 DIAGNOSIS — Z8742 Personal history of other diseases of the female genital tract: Secondary | ICD-10-CM | POA: Insufficient documentation

## 2012-09-18 DIAGNOSIS — G43909 Migraine, unspecified, not intractable, without status migrainosus: Secondary | ICD-10-CM | POA: Insufficient documentation

## 2012-09-18 LAB — COMPREHENSIVE METABOLIC PANEL
ALT: 18 U/L (ref 0–35)
AST: 18 U/L (ref 0–37)
Albumin: 3.9 g/dL (ref 3.5–5.2)
Alkaline Phosphatase: 120 U/L — ABNORMAL HIGH (ref 39–117)
BUN: 15 mg/dL (ref 6–23)
CO2: 28 mEq/L (ref 19–32)
Calcium: 9.2 mg/dL (ref 8.4–10.5)
Chloride: 104 mEq/L (ref 96–112)
Creatinine, Ser: 0.8 mg/dL (ref 0.50–1.10)
GFR calc Af Amer: >90 mL/min (ref >90)
GFR calc non Af Amer: 82 mL/min — ABNORMAL LOW (ref >90)
Glucose, Bld: 100 mg/dL — ABNORMAL HIGH (ref 70–99)
Potassium: 3.8 mEq/L (ref 3.5–5.1)
Sodium: 143 mEq/L (ref 135–145)
Total Bilirubin: 0.2 mg/dL — ABNORMAL LOW (ref 0.3–1.2)
Total Protein: 7.9 g/dL (ref 6.0–8.3)

## 2012-09-18 LAB — URINALYSIS, ROUTINE W REFLEX MICROSCOPIC
Bilirubin Urine: NEGATIVE
Glucose, UA: NEGATIVE mg/dL
Hgb urine dipstick: NEGATIVE
Ketones, ur: NEGATIVE mg/dL
Leukocytes, UA: NEGATIVE
Nitrite: NEGATIVE
Protein, ur: NEGATIVE mg/dL
Specific Gravity, Urine: 1.019 (ref 1.005–1.030)
Urobilinogen, UA: 0.2 mg/dL (ref 0.0–1.0)
pH: 6 (ref 5.0–8.0)

## 2012-09-18 LAB — CBC WITH DIFFERENTIAL/PLATELET
Basophils Absolute: 0.1 10*3/uL (ref 0.0–0.1)
Basophils Relative: 1 % (ref 0–1)
Eosinophils Absolute: 0.1 10*3/uL (ref 0.0–0.7)
Eosinophils Relative: 1 % (ref 0–5)
HCT: 36.5 % (ref 36.0–46.0)
Hemoglobin: 11.5 g/dL — ABNORMAL LOW (ref 12.0–15.0)
Lymphocytes Relative: 42 % (ref 12–46)
Lymphs Abs: 2.6 10*3/uL (ref 0.7–4.0)
MCH: 23.2 pg — ABNORMAL LOW (ref 26.0–34.0)
MCHC: 31.5 g/dL (ref 30.0–36.0)
MCV: 73.6 fL — ABNORMAL LOW (ref 78.0–100.0)
Monocytes Absolute: 0.5 10*3/uL (ref 0.1–1.0)
Monocytes Relative: 8 % (ref 3–12)
Neutro Abs: 2.8 10*3/uL (ref 1.7–7.7)
Neutrophils Relative %: 48 % (ref 43–77)
Platelets: 200 10*3/uL (ref 150–400)
RBC: 4.96 MIL/uL (ref 3.87–5.11)
RDW: 14.3 % (ref 11.5–15.5)
WBC: 6.1 10*3/uL (ref 4.0–10.5)

## 2012-09-18 LAB — TROPONIN I: Troponin I: <0.3 ng/mL (ref <0.30)

## 2012-09-18 LAB — LIPASE, BLOOD: Lipase: 24 U/L (ref 11–59)

## 2012-09-18 MED ORDER — METOCLOPRAMIDE HCL 10 MG PO TABS: 10.0000 mg | ORAL_TABLET | Freq: Four times a day (QID) | ORAL | Status: DC

## 2012-09-18 MED ORDER — METOCLOPRAMIDE HCL 5 MG/ML IJ SOLN
10.0000 mg | Freq: Once | INTRAMUSCULAR | Status: AC
  Administered 2012-09-18: 10 mg via INTRAVENOUS
  Filled 2012-09-18: qty 2

## 2012-09-18 MED ORDER — SODIUM CHLORIDE 0.9 % IV BOLUS (SEPSIS)
1000.0000 mL | Freq: Once | INTRAVENOUS | Status: AC
  Administered 2012-09-18: 1000 mL via INTRAVENOUS

## 2012-09-18 NOTE — Assessment & Plan Note (Signed)
Pt with intractable nausea/vomitting, unable to keep down meds/liquids/food. Will refer to ED for further evaluation.  Need to exclude SBO, pancreatitis.

## 2012-09-18 NOTE — Assessment & Plan Note (Signed)
BP Readings from Last 3 Encounters:  09/18/12 200/104  09/17/12 194/84  07/25/12 156/89   BP is extremely elevated due to inability to keep down BP meds.  Clonidine 0.1mg  was given today in the office.  May need IV antihypertensive in ED.

## 2012-09-18 NOTE — ED Provider Notes (Signed)
History     CSN: [id number]  Arrival date & time 09/18/12  1051   First MD Initiated Contact with Patient 09/18/12 1112      Chief Complaint  Patient presents with  . Hypertension    (Consider location/radiation/quality/duration/timing/severity/associated sxs/prior treatment) Patient is a 52 y.o. female presenting with hypertension.  Hypertension   Pt with history of multiple medical problems including HTN and gastroparesis reports she has some vomiting every day, but has been more frequent the last several days. Unable to keep down fluids or medications, now also complaining of soreness in chest and L upper abdomen worse with vomiting. She was seen in PCP office here earlier today and referred to the ED for evaluation. She was also seen in the Hematology office yesterday for chronic anemia followup but did not mention the vomiting or chest pain then. She has had good control of her symptoms in the past with Reglan but has run out. She is followed by Dr. Arlyce Dice locally and at the GI clinic at [facility].   Past Medical History  Diagnosis Date  . Atypical chest pain   . Hyperlipidemia   . Hypertension   . GERD (gastroesophageal reflux disease)   . Diabetes mellitus, type II   . Constipation   . Routine general medical examination at a health care facility   . Gastroparesis   . Iron deficiency anemia   . Esophageal stricture   . Migraine headache   . Endometriosis   . Cyst, ovarian   . Peptic ulcer disease   . Non-compliance   . Allergy     allergic rhinitis    Past Surgical History  Procedure Laterality Date  . Abdominal exploration surgery      w/bso   . Knee surgery  2005     left knee  . Total abdominal hysterectomy    . Cholecystectomy    . Cardiac catheterization  2009    mild non obstructive CAD    Family History  Problem Relation Age of Onset  . Hypertension Mother   . Hypertension Father   . Diabetes Father   . Prostate cancer Father   . Kidney  disease Father   . Diabetes Sister   . Diabetes Maternal Grandmother   . Colon cancer Neg Hx   . Lung cancer Maternal Grandfather     History  Substance Use Topics  . Smoking status: Former Smoker    Quit date: 06/25/1994  . Smokeless tobacco: Never Used     Comment: Quit in 1996  . Alcohol Use: Yes     Comment: social drinker    OB History   Grav Para Term Preterm Abortions TAB SAB Ect Mult Living                  Review of Systems All other systems reviewed and are negative except as noted in HPI.   Allergies  Ace inhibitors and Celebrex  Home Medications   Current Outpatient Rx  Name  Route  Sig  Dispense  Refill  . dexlansoprazole (DEXILANT) 60 MG capsule   Oral   Take 1 capsule (60 mg total) by mouth daily.   90 capsule   3   . dicyclomine (BENTYL) 10 MG capsule   Oral   Take 10 mg by mouth as needed.          . gabapentin (NEURONTIN) 100 MG capsule   Oral   Take 100 mg by mouth Three times a day.         Marland Kitchen  polyethylene glycol powder (MIRALAX) powder   Oral   Take 17 g by mouth daily. For indigestion         . traMADol (ULTRAM) 50 MG tablet   Oral   Take 1 tablet (50 mg total) by mouth every 8 (eight) hours as needed.   30 tablet   0   . valsartan-hydrochlorothiazide (DIOVAN-HCT) 160-12.5 MG per tablet   Oral   Take 1 tablet by mouth daily.   30 tablet   3     BP 195/93  Pulse 65  Temp(Src) 97.7 F (36.5 C) (Oral)  Resp 20  Ht 5\' 4"  (1.626 m)  Wt 218 lb (98.884 kg)  BMI 37.4 kg/m2  SpO2 100%  Physical Exam  Nursing note and vitals reviewed. Constitutional: She is oriented to person, place, and time. She appears well-developed and well-nourished.  HENT:  Head: Normocephalic and atraumatic.  Eyes: EOM are normal. Pupils are equal, round, and reactive to light.  Neck: Normal range of motion. Neck supple.  Cardiovascular: Normal rate, normal heart sounds and intact distal pulses.   Pulmonary/Chest: Effort normal and breath  sounds normal. She exhibits no tenderness.  Abdominal: Bowel sounds are normal. She exhibits no distension. There is tenderness (Epigastric, LUQ, mild). There is no rebound and no guarding.  Musculoskeletal: Normal range of motion. She exhibits no edema and no tenderness.  Neurological: She is alert and oriented to person, place, and time. She has normal strength. No cranial nerve deficit or sensory deficit.  Skin: Skin is warm and dry. No rash noted.  Psychiatric: She has a normal mood and affect.    ED Course  Procedures (including critical care time)  Labs Reviewed  CBC WITH DIFFERENTIAL - Abnormal; Notable for the following:    Hemoglobin 11.5 (*)    MCV 73.6 (*)    MCH 23.2 (*)    All other components within normal limits  COMPREHENSIVE METABOLIC PANEL - Abnormal; Notable for the following:    Glucose, Bld 100 (*)    Alkaline Phosphatase 120 (*)    Total Bilirubin 0.2 (*)    GFR calc non Af Amer 82 (*)    All other components within normal limits  LIPASE, BLOOD  URINALYSIS, ROUTINE W REFLEX MICROSCOPIC  TROPONIN I   No results found.   1. Gastroparesis       MDM   Date: 09/18/2012  Rate: 67  Rhythm: normal sinus rhythm  QRS Axis: left  Intervals: normal  ST/T Wave abnormalities: nonspecific T wave changes  Conduction Disutrbances:none  Narrative Interpretation:   Old EKG Reviewed: unchanged  Labs unremarkable, pt feeling better and tolerating PO. No concern for ACS, abd is benign. Will d/c with Rx for Reglan. Advised GI followup if symptoms are persistent. Return to the ED for worsening.         Charles B. Bernette Mayers, MD 09/18/12 1336

## 2012-09-18 NOTE — Assessment & Plan Note (Signed)
Pt presents with atypical chest pain.  EKG is performed today in the office and compared to previous.  Appears unchanged, however I have advised her to be seen in the ED for further evaluation.

## 2012-09-18 NOTE — ED Notes (Signed)
Pt. Was unable to keep blood pressure medication down x 1 week due to vomiting.

## 2012-09-18 NOTE — Progress Notes (Signed)
Subjective:    Patient ID: [name], female    DOB: September 21, 1960, 52 y.o.   MRN: [id number]  HPI  [name] is a 52 yr old female who presents today with chief complaint of abdominal pain.  She is s/p cholecystectomy and also has hx of gastritis by EGD. Also has hx of gastroparesis.  She is maintained on dexilant.  She reports left upper quadrant pain and tenderness x 1 week.  She reports + vomiting.  Denies coffee ground emesis or black/bloody stools.  Vomiting x 1 week.  She reports associated chest pain.  Has sense of "lump in my throat."    HTN- She is currently maintained on diovan-hct.  She has been unable to keep down liquids, foods or medicine.     Review of Systems    see HPI  Past Medical History  Diagnosis Date  . Atypical chest pain   . Hyperlipidemia   . Hypertension   . GERD (gastroesophageal reflux disease)   . Diabetes mellitus, type II   . Constipation   . Routine general medical examination at a health care facility   . Gastroparesis   . Iron deficiency anemia   . Esophageal stricture   . Migraine headache   . Endometriosis   . Cyst, ovarian   . Peptic ulcer disease   . Non-compliance   . Allergy     allergic rhinitis    History   Social History  . Marital Status: Married    Spouse Name: N/A    Number of Children: 2  . Years of Education: N/A   Occupational History  . DISABILITY    Social History Main Topics  . Smoking status: Former Smoker    Quit date: 06/25/1994  . Smokeless tobacco: Never Used     Comment: Quit in 1996  . Alcohol Use: Yes     Comment: social drinker  . Drug Use: No  . Sexually Active: Not on file   Other Topics Concern  . Not on file   Social History Narrative   Married    Has 2 grown children.  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   Former Smoker Quit tobacco in 1996.  She was a pack a day smoker for approximately 10 years.   Alcohol use-yes: Social    Daily Caffeine Use:6 pack of pepsi daily      Illicit Drug Use - no    Patient does not get regular exercise.       Smoking Status:  quit    Past Surgical History  Procedure Laterality Date  . Abdominal exploration surgery      w/bso   . Knee surgery  2005     left knee  . Total abdominal hysterectomy    . Cholecystectomy    . Cardiac catheterization  2009    mild non obstructive CAD    Family History  Problem Relation Age of Onset  . Hypertension Mother   . Hypertension Father   . Diabetes Father   . Prostate cancer Father   . Kidney disease Father   . Diabetes Sister   . Diabetes Maternal Grandmother   . Colon cancer Neg Hx   . Lung cancer Maternal Grandfather     Allergies  Allergen Reactions  . Ace Inhibitors     REACTION: chronic cough  . Celebrex (Celecoxib) Other (See Comments)    "makes me bleed"    Current Outpatient Prescriptions on File Prior to Visit  Medication  Sig Dispense Refill  . dexlansoprazole (DEXILANT) 60 MG capsule Take 1 capsule (60 mg total) by mouth daily.  90 capsule  3  . dicyclomine (BENTYL) 10 MG capsule Take 10 mg by mouth as needed.       . gabapentin (NEURONTIN) 100 MG capsule Take 100 mg by mouth Three times a day.      . polyethylene glycol powder (MIRALAX) powder Take 17 g by mouth daily. For indigestion      . traMADol (ULTRAM) 50 MG tablet Take 1 tablet (50 mg total) by mouth every 8 (eight) hours as needed.  30 tablet  0  . valsartan-hydrochlorothiazide (DIOVAN-HCT) 160-12.5 MG per tablet Take 1 tablet by mouth daily.  30 tablet  3   No current facility-administered medications on file prior to visit.    BP 200/104  Pulse 65  Temp(Src) 98.2 F (36.8 C) (Oral)  Resp 16  Wt 218 lb (98.884 kg)  BMI 37.4 kg/m2  SpO2 99%    Objective:   Physical Exam  Constitutional: She appears well-developed and well-nourished. No distress.  HENT:  Head: Normocephalic and atraumatic.  Cardiovascular: Normal rate and regular rhythm.   No murmur heard. Pulmonary/Chest: Effort  normal and breath sounds normal. No respiratory distress. She has no wheezes. She has no rales. She exhibits no tenderness.  Abdominal: Soft. Bowel sounds are decreased. There is tenderness in the epigastric area and left upper quadrant. There is no rigidity and no rebound.          Assessment & Plan:  Report was given to charge nurse in [facility] ED.  Pt to be wheeled down by CMA.

## 2012-09-18 NOTE — ED Notes (Signed)
PO fluids provided. 

## 2012-09-18 NOTE — Patient Instructions (Addendum)
Please go directly to the ED downstairs for further evaluation.

## 2012-12-18 ENCOUNTER — Other Ambulatory Visit: Payer: Medicare PPO | Admitting: Lab

## 2012-12-18 ENCOUNTER — Ambulatory Visit (HOSPITAL_BASED_OUTPATIENT_CLINIC_OR_DEPARTMENT_OTHER): Payer: Medicare PPO | Admitting: Hematology & Oncology

## 2012-12-18 ENCOUNTER — Other Ambulatory Visit (HOSPITAL_BASED_OUTPATIENT_CLINIC_OR_DEPARTMENT_OTHER): Payer: Medicare PPO

## 2012-12-18 VITALS — BP 201/83 | HR 53 | Temp 98.6°F | Resp 16 | Ht 64.0 in | Wt 222.0 lb

## 2012-12-18 DIAGNOSIS — D509 Iron deficiency anemia, unspecified: Secondary | ICD-10-CM

## 2012-12-18 LAB — IRON AND TIBC
%SAT: 39 % (ref 20–55)
Iron: 109 ug/dL (ref 42–145)
TIBC: 276 ug/dL (ref 250–470)
UIBC: 167 ug/dL (ref 125–400)

## 2012-12-18 LAB — CBC WITH DIFFERENTIAL (CANCER CENTER ONLY)
BASO#: 0 10*3/uL (ref 0.0–0.2)
BASO%: 0.4 % (ref 0.0–2.0)
EOS%: 1 % (ref 0.0–7.0)
Eosinophils Absolute: 0.1 10*3/uL (ref 0.0–0.5)
HCT: 37.4 % (ref 34.8–46.6)
HGB: 11.6 g/dL (ref 11.6–15.9)
LYMPH#: 2.6 10*3/uL (ref 0.9–3.3)
LYMPH%: 36.7 % (ref 14.0–48.0)
MCH: 23.6 pg — ABNORMAL LOW (ref 26.0–34.0)
MCHC: 31 g/dL — ABNORMAL LOW (ref 32.0–36.0)
MCV: 76 fL — ABNORMAL LOW (ref 81–101)
MONO#: 0.6 10*3/uL (ref 0.1–0.9)
MONO%: 8.2 % (ref 0.0–13.0)
NEUT#: 3.9 10*3/uL (ref 1.5–6.5)
NEUT%: 53.7 % (ref 39.6–80.0)
Platelets: 223 10*3/uL (ref 145–400)
RBC: 4.91 10*6/uL (ref 3.70–5.32)
RDW: 13.6 % (ref 11.1–15.7)
WBC: 7.2 10*3/uL (ref 3.9–10.0)

## 2012-12-18 LAB — FERRITIN: Ferritin: 944 ng/mL — ABNORMAL HIGH (ref 10–291)

## 2012-12-18 NOTE — Progress Notes (Signed)
This office note has been dictated.

## 2012-12-19 NOTE — Progress Notes (Signed)
DIAGNOSIS:  Recurrent iron deficiency anemia.  CURRENT THERAPY:  IV iron as indicated.  INTERIM HISTORY:  [name] comes in for followup.  She is doing pretty well.  She now has another new grandchild.  Her daughter in [place] had the baby.  Everything turned out well.  [name] last iron was given back in January.  We last saw [name] back in March.  At that point in time, her ferritin was 1174 with an iron saturation of 30%.  She has not had any kind of aches or pains.  There is no change in bowel or bladder habits.  There has been no cough or shortness of breath. There have been no rashes.  She has had no leg swelling.  PHYSICAL EXAMINATION:  General:  This is a well-developed, well- nourished African American female in no obvious distress.  Vital signs: Temperature of 98.6, pulse 53, respiratory rate 16, blood pressure 201/83.  Weight is 222.  Head and neck:  Normocephalic, atraumatic skull.  There are no ocular or oral lesions.  There are no palpable cervical or supraclavicular lymph nodes.  Lungs:  Clear bilaterally. Cardiac:  Regular rate and rhythm with a normal S1, S2.  There are no murmurs, rubs, or bruits.  Abdomen:  Soft with good bowel sounds.  There is no palpable abdominal mass.  There is no fluid wave.  There is no palpable hepatosplenomegaly.  Extremities:  Show no clubbing, cyanosis, or edema.  Neurological:  Shows no focal neurological deficits.  LABORATORY STUDIES:  White cell count is 7.3, hemoglobin 11.6, hematocrit 37.4, platelet count 223.  MCV is 76.  IMPRESSION:  [name] is a very nice 52 year old African American female with history of recurrent iron deficiency anemia.  She is doing well.  Her hemoglobin is being maintained.  We will get her back in 3 months now.  I do not see any need for blood work in between visits.    ______________________________ Josph Macho, M.D. PRE/MEDQ  D:  12/18/2012  T:  12/19/2012  Job:  [id number]

## 2013-01-03 ENCOUNTER — Emergency Department (HOSPITAL_BASED_OUTPATIENT_CLINIC_OR_DEPARTMENT_OTHER)
Admission: EM | Admit: 2013-01-03 | Discharge: 2013-01-04 | Disposition: A | Discharge status: Home / Self Care | Payer: Medicare PPO | Attending: Emergency Medicine | Admitting: Emergency Medicine

## 2013-01-03 ENCOUNTER — Encounter (HOSPITAL_BASED_OUTPATIENT_CLINIC_OR_DEPARTMENT_OTHER): Payer: Self-pay | Admitting: *Deleted

## 2013-01-03 DIAGNOSIS — I10 Essential (primary) hypertension: Secondary | ICD-10-CM | POA: Insufficient documentation

## 2013-01-03 DIAGNOSIS — Z8719 Personal history of other diseases of the digestive system: Secondary | ICD-10-CM | POA: Insufficient documentation

## 2013-01-03 DIAGNOSIS — Z8709 Personal history of other diseases of the respiratory system: Secondary | ICD-10-CM | POA: Insufficient documentation

## 2013-01-03 DIAGNOSIS — T783XXA Angioneurotic edema, initial encounter: Secondary | ICD-10-CM

## 2013-01-03 DIAGNOSIS — Z87891 Personal history of nicotine dependence: Secondary | ICD-10-CM | POA: Insufficient documentation

## 2013-01-03 DIAGNOSIS — Z8639 Personal history of other endocrine, nutritional and metabolic disease: Secondary | ICD-10-CM | POA: Insufficient documentation

## 2013-01-03 DIAGNOSIS — Z9889 Other specified postprocedural states: Secondary | ICD-10-CM | POA: Insufficient documentation

## 2013-01-03 DIAGNOSIS — T465X5A Adverse effect of other antihypertensive drugs, initial encounter: Secondary | ICD-10-CM | POA: Insufficient documentation

## 2013-01-03 DIAGNOSIS — Z8742 Personal history of other diseases of the female genital tract: Secondary | ICD-10-CM | POA: Insufficient documentation

## 2013-01-03 DIAGNOSIS — G43909 Migraine, unspecified, not intractable, without status migrainosus: Secondary | ICD-10-CM | POA: Insufficient documentation

## 2013-01-03 DIAGNOSIS — Z79899 Other long term (current) drug therapy: Secondary | ICD-10-CM | POA: Insufficient documentation

## 2013-01-03 DIAGNOSIS — Z8619 Personal history of other infectious and parasitic diseases: Secondary | ICD-10-CM | POA: Insufficient documentation

## 2013-01-03 DIAGNOSIS — E119 Type 2 diabetes mellitus without complications: Secondary | ICD-10-CM | POA: Insufficient documentation

## 2013-01-03 DIAGNOSIS — Z8711 Personal history of peptic ulcer disease: Secondary | ICD-10-CM | POA: Insufficient documentation

## 2013-01-03 DIAGNOSIS — Z862 Personal history of diseases of the blood and blood-forming organs and certain disorders involving the immune mechanism: Secondary | ICD-10-CM | POA: Insufficient documentation

## 2013-01-03 DIAGNOSIS — Z9861 Coronary angioplasty status: Secondary | ICD-10-CM | POA: Insufficient documentation

## 2013-01-03 DIAGNOSIS — K219 Gastro-esophageal reflux disease without esophagitis: Secondary | ICD-10-CM | POA: Insufficient documentation

## 2013-01-03 MED ORDER — SODIUM CHLORIDE 0.9 % IV SOLN
INTRAVENOUS | Status: DC
  Administered 2013-01-03: 23:00:00 via INTRAVENOUS

## 2013-01-03 MED ORDER — FAMOTIDINE IN NACL 20-0.9 MG/50ML-% IV SOLN
20.0000 mg | Freq: Once | INTRAVENOUS | Status: AC
  Administered 2013-01-03: 20 mg via INTRAVENOUS
  Filled 2013-01-03: qty 50

## 2013-01-03 MED ORDER — METHYLPREDNISOLONE SODIUM SUCC 125 MG IJ SOLR
125.0000 mg | Freq: Once | INTRAMUSCULAR | Status: AC
  Administered 2013-01-03: 125 mg via INTRAVENOUS
  Filled 2013-01-03: qty 2

## 2013-01-03 MED ORDER — EPINEPHRINE HCL 1 MG/ML IJ SOLN
0.3000 mg | Freq: Once | INTRAMUSCULAR | Status: AC
  Administered 2013-01-03: 0.3 mg via INTRAMUSCULAR
  Filled 2013-01-03: qty 1

## 2013-01-03 NOTE — ED Notes (Signed)
Pt describes sudden onset of lip swelling. No known ingestion or exposure to unusual substance or meds. No distress.

## 2013-01-03 NOTE — ED Provider Notes (Signed)
History     This chart was scribed for Denise Human, MD by Jiles Prows, ED Scribe. The patient was seen in room MH03/MH03 and the patient's care was started at 10:16 PM.  CSN: [id number] Arrival date & time 01/03/13  2149   Chief Complaint  Patient presents with  . Oral Swelling   The history is provided by the patient and medical records. No language interpreter was used.   HPI Comments: [name] is a 52 y.o. female with a h/o HTN, DM, GERD, migraines, and allergieswho presents to the Emergency Department complaining of sudden, worsening, constant swelling to upper lip onset this afternoon.  Pt reports that she was driving home from [place], [place], when her face started swelling.  She reports that it keeps getting worse, so she came into the ED.  Pt denies headache, diaphoresis, fever, chills, nausea, vomiting, diarrhea, weakness, cough, SOB and any other pain.  PCP is Sandford Craze  Past Medical History  Diagnosis Date  . Atypical chest pain   . Hyperlipidemia   . Hypertension   . GERD (gastroesophageal reflux disease)   . Diabetes mellitus, type II   . Constipation   . Routine general medical examination at a health care facility   . Gastroparesis   . Iron deficiency anemia   . Esophageal stricture   . Migraine headache   . Endometriosis   . Cyst, ovarian   . Peptic ulcer disease   . Non-compliance   . Allergy     allergic rhinitis   Past Surgical History  Procedure Laterality Date  . Abdominal exploration surgery      w/bso   . Knee surgery  2005     left knee  . Total abdominal hysterectomy    . Cholecystectomy    . Cardiac catheterization  2009    mild non obstructive CAD   Family History  Problem Relation Age of Onset  . Hypertension Mother   . Hypertension Father   . Diabetes Father   . Prostate cancer Father   . Kidney disease Father   . Diabetes Sister   . Diabetes Maternal Grandmother   . Colon cancer Neg Hx   . Lung  cancer Maternal Grandfather    History  Substance Use Topics  . Smoking status: Former Smoker    Quit date: 06/25/1994  . Smokeless tobacco: Never Used     Comment: Quit in 1996  . Alcohol Use: Yes     Comment: social drinker   OB History   Grav Para Term Preterm Abortions TAB SAB Ect Mult Living                 Review of Systems  All other systems reviewed and are negative.    Allergies  Ace inhibitors and Celebrex  Home Medications   Current Outpatient Rx  Name  Route  Sig  Dispense  Refill  . cyclobenzaprine (FLEXERIL) 5 MG tablet      Take by mouth as needed.         Marland Kitchen dexlansoprazole (DEXILANT) 60 MG capsule   Oral   Take 60 mg by mouth as needed.         . dicyclomine (BENTYL) 10 MG capsule   Oral   Take 10 mg by mouth as needed.          . gabapentin (NEURONTIN) 100 MG capsule   Oral   Take 100 mg by mouth Three times a day. Only  takes when she wants to         . HYDROcodone-acetaminophen (NORCO) 7.5-325 MG per tablet   Oral   Take 1 tablet by mouth every 8 (eight) hours as needed.          . ondansetron (ZOFRAN-ODT) 8 MG disintegrating tablet      8 mg. Take 1 tablet (8 mg total) by mouth every 8 (eight) hours as needed for Nausea.         . polyethylene glycol (MIRALAX / GLYCOLAX) packet      Take 17 g by mouth as needed.         . propranolol (INDERAL) 40 MG tablet      Take one daily for a week, then one twice/day for a week, then increase by one tab each week up to 2 in am and 2 in pm         . SUMAtriptan (IMITREX) 50 MG tablet      Take by mouth Every 2 hours as needed.         . valsartan-hydrochlorothiazide (DIOVAN-HCT) 160-12.5 MG per tablet   Oral   Take 1 tablet by mouth daily.   30 tablet   3    BP 212/94  Pulse 57  Temp(Src) 98.4 F (36.9 C) (Oral)  Resp 16  Ht 5\' 4"  (1.626 m)  Wt 220 lb (99.791 kg)  BMI 37.74 kg/m2  SpO2 100% Physical Exam  Nursing note and vitals reviewed. Constitutional: She is  oriented to person, place, and time. She appears well-developed and well-nourished. No distress.  HENT:  Head: Normocephalic and atraumatic.  Eyes: EOM are normal.  Neck: Neck supple. No tracheal deviation present.  Cardiovascular: Normal rate, regular rhythm and normal heart sounds.   Pulmonary/Chest: Effort normal and breath sounds normal. No respiratory distress.  No trouble breathing.  Musculoskeletal: Normal range of motion.  Neurological: She is alert and oriented to person, place, and time.  Neurologically intact.  Skin: Skin is warm and dry.  Puffy edema of upper lip, not of lower lip, and not of tongue.  Airway is intact.  Psychiatric: She has a normal mood and affect. Her behavior is normal.    ED Course  Procedures (including critical care time) DIAGNOSTIC STUDIES: Oxygen Saturation is 100% on RA, normal by my interpretation.    COORDINATION OF CARE: 10:19 PM - Discussed ED treatment with pt at bedside including epinephrine, benadryl, pepsin, and prednisone and pt agrees.  Discussed angio edema.  11:29 PM Swelling is a little better, but still as puffy swelling of upper lip.  Repeat epinephrine 0.3 cc IM.  12:34 AM No real change, but no worsening.  At this point we have done what we can for her angioedema.  Rx Prednisone taper, Benadryl 50 mg q6h prn allergic swelling.  Followup with Sandford Craze, RNP, on Monday to report her episode of angioedema and to start on different antihypertensive medicine.  1. Angioedema of lips, initial encounter      I personally performed the services described in this documentation, which was scribed in my presence. The recorded information has been reviewed and is accurate.  Denise Human, MD        Carleene Cooper III, MD 01/04/13 [phone]

## 2013-01-04 ENCOUNTER — Telehealth: Payer: Self-pay | Admitting: Family

## 2013-01-04 MED ORDER — PREDNISONE 20 MG PO TABS: ORAL_TABLET | ORAL | Status: DC

## 2013-01-04 MED ORDER — DIPHENHYDRAMINE HCL 25 MG PO TABS: 50.0000 mg | ORAL_TABLET | Freq: Four times a day (QID) | ORAL | Status: DC

## 2013-01-04 NOTE — Telephone Encounter (Addendum)
Please call pt and arrange ED follow up visit this week. Also,  Please ask her how her swelling is doing and how she is breathing since starting the prednisone.

## 2013-01-05 NOTE — Telephone Encounter (Signed)
Scheduled ED visit for 01/09/13 at 8:00am. Also, patient states that the swelling has went down some and her breathing is fine.

## 2013-01-09 ENCOUNTER — Encounter: Payer: Self-pay | Admitting: Family

## 2013-01-09 ENCOUNTER — Ambulatory Visit (INDEPENDENT_AMBULATORY_CARE_PROVIDER_SITE_OTHER): Payer: Medicare PPO | Admitting: Family

## 2013-01-09 VITALS — BP 176/94 | HR 61 | Temp 98.1°F | Resp 16 | Ht 64.0 in | Wt 224.1 lb

## 2013-01-09 DIAGNOSIS — R079 Chest pain, unspecified: Secondary | ICD-10-CM

## 2013-01-09 DIAGNOSIS — T783XXA Angioneurotic edema, initial encounter: Secondary | ICD-10-CM | POA: Insufficient documentation

## 2013-01-09 DIAGNOSIS — T783XXS Angioneurotic edema, sequela: Secondary | ICD-10-CM

## 2013-01-09 DIAGNOSIS — I10 Essential (primary) hypertension: Secondary | ICD-10-CM

## 2013-01-09 DIAGNOSIS — T788XXS Other adverse effects, not elsewhere classified, sequela: Secondary | ICD-10-CM

## 2013-01-09 DIAGNOSIS — R0789 Other chest pain: Secondary | ICD-10-CM

## 2013-01-09 DIAGNOSIS — T7589XS Other specified effects of external causes, sequela: Secondary | ICD-10-CM

## 2013-01-09 MED ORDER — HYDROCHLOROTHIAZIDE 25 MG PO TABS: 25.0000 mg | ORAL_TABLET | Freq: Every day | ORAL | Status: DC

## 2013-01-09 MED ORDER — AMLODIPINE BESYLATE 5 MG PO TABS: 5.0000 mg | ORAL_TABLET | Freq: Every day | ORAL | Status: DC

## 2013-01-09 NOTE — Progress Notes (Signed)
Subjective:    Patient ID: [name], female    DOB: 01/06/1961, 51 y.o.   MRN: [id number]  HPI  [name] is a 52 yr old female who presents today for 1 week follow up of her ED visit for angioedema.  The pt's diovan was the suspected culprit.  Diovan was discontinued. The pt was given benadryl and a prednisone taper.  Records are reviewed.  She reports that her lip swelling quickly improved with the benadryl and prednisone. She denies any further lip swelling.  Denies SOB/hives.  Denies known food allergy. Denies recent seafood prior to episode.    HTN-  diovan HCT was discontinued in the ED.  An alternative medication was not given.   The pt later tells me that she has been having some burning chest pain which feels like her  GERD.  Review of Systems See HPI  Past Medical History  Diagnosis Date  . Atypical chest pain   . Hyperlipidemia   . Hypertension   . GERD (gastroesophageal reflux disease)   . Diabetes mellitus, type II   . Constipation   . Routine general medical examination at a health care facility   . Gastroparesis   . Iron deficiency anemia   . Esophageal stricture   . Migraine headache   . Endometriosis   . Cyst, ovarian   . Peptic ulcer disease   . Non-compliance   . Allergy     allergic rhinitis    History   Social History  . Marital Status: Married    Spouse Name: N/A    Number of Children: 2  . Years of Education: N/A   Occupational History  . DISABILITY    Social History Main Topics  . Smoking status: Former Smoker    Quit date: 06/25/1994  . Smokeless tobacco: Never Used     Comment: Quit in 1996  . Alcohol Use: Yes     Comment: social drinker  . Drug Use: No  . Sexually Active: Not on file   Other Topics Concern  . Not on file   Social History Narrative   Married    Has 2 grown children.  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   Former Smoker Quit tobacco in 1996.  She was a pack a day smoker for approximately 10 years.   Alcohol use-yes: Social    Daily Caffeine Use:6 pack of pepsi daily     Illicit Drug Use - no    Patient does not get regular exercise.       Smoking Status:  quit    Past Surgical History  Procedure Laterality Date  . Abdominal exploration surgery      w/bso   . Knee surgery  2005     left knee  . Total abdominal hysterectomy    . Cholecystectomy    . Cardiac catheterization  2009    mild non obstructive CAD    Family History  Problem Relation Age of Onset  . Hypertension Mother   . Hypertension Father   . Diabetes Father   . Prostate cancer Father   . Kidney disease Father   . Diabetes Sister   . Diabetes Maternal Grandmother   . Colon cancer Neg Hx   . Lung cancer Maternal Grandfather     Allergies  Allergen Reactions  . Ace Inhibitors     REACTION: chronic cough  . Celebrex (Celecoxib) Other (See Comments)    "makes me bleed"  . Diovan (Valsartan)  angioedema    Current Outpatient Prescriptions on File Prior to Visit  Medication Sig Dispense Refill  . cyclobenzaprine (FLEXERIL) 5 MG tablet Take by mouth as needed.      Marland Kitchen dexlansoprazole (DEXILANT) 60 MG capsule Take 60 mg by mouth as needed.      . dicyclomine (BENTYL) 10 MG capsule Take 10 mg by mouth as needed.       . diphenhydrAMINE (BENADRYL) 25 MG tablet Take 2 tablets (50 mg total) by mouth every 6 (six) hours.  20 tablet  0  . gabapentin (NEURONTIN) 100 MG capsule Take 100 mg by mouth Three times a day. Only takes when she wants to      . HYDROcodone-acetaminophen (NORCO) 7.5-325 MG per tablet Take 1 tablet by mouth every 8 (eight) hours as needed.       . ondansetron (ZOFRAN-ODT) 8 MG disintegrating tablet 8 mg. Take 1 tablet (8 mg total) by mouth every 8 (eight) hours as needed for Nausea.      . polyethylene glycol (MIRALAX / GLYCOLAX) packet Take 17 g by mouth as needed.      . predniSONE (DELTASONE) 20 MG tablet Tale 3 tablets per day for 2 days, then take 2 tablets per day for 2 days, then take  1 tablet per day for 2 days.  12 tablet  0  . propranolol (INDERAL) 40 MG tablet Take one daily for a week, then one twice/day for a week, then increase by one tab each week up to 2 in am and 2 in pm      . SUMAtriptan (IMITREX) 50 MG tablet Take by mouth Every 2 hours as needed.      . valsartan-hydrochlorothiazide (DIOVAN-HCT) 160-12.5 MG per tablet Take 1 tablet by mouth daily.  30 tablet  3   No current facility-administered medications on file prior to visit.    BP 197/98  Pulse 61  Temp(Src) 98.1 F (36.7 C) (Oral)  Resp 16  Ht 5\' 4"  (1.626 m)  Wt 224 lb 1.3 oz (101.642 kg)  BMI 38.44 kg/m2  SpO2 99%        Objective:   Physical Exam  Constitutional: She is oriented to person, place, and time. She appears well-developed and well-nourished. No distress.  HENT:  Head: Normocephalic and atraumatic.  No lip swelling is noted  Cardiovascular: Normal rate and regular rhythm.   No murmur heard. Pulmonary/Chest: Effort normal and breath sounds normal. No respiratory distress. She has no wheezes. She has no rales. She exhibits no tenderness.  Musculoskeletal: She exhibits no edema.  Neurological: She is alert and oriented to person, place, and time.  Psychiatric: She has a normal mood and affect. Her behavior is normal. Judgment and thought content normal.          Assessment & Plan:

## 2013-01-09 NOTE — Patient Instructions (Addendum)
Please start hctz and amlodipine.  Follow up in 2 weeks for BP check/office visit.

## 2013-01-09 NOTE — Assessment & Plan Note (Addendum)
Suspect GI etiology given gastroparesis and gerd.  EKG is performed today in the office and it is unchanged compared to 09/18/12. She is instructed to go to the ER if worsening chest pain.

## 2013-01-09 NOTE — Assessment & Plan Note (Addendum)
Deteriorated.  0.1mg  clonidine PO given at 8:30AM.  Follow up BP at 9AM is 198/110. Additional 0.1mg  clonidine given. Follow up BP was 176/94.  Start

## 2013-01-09 NOTE — Assessment & Plan Note (Deleted)
Deteriorated.  Continue off diovan/hct. Start amlodipine, hctz.  Clonidine 0.1 was given today in the office.

## 2013-01-09 NOTE — Assessment & Plan Note (Signed)
Felt secondary to ARB. Resolved.  Diovan added to allergy list.

## 2013-01-23 ENCOUNTER — Telehealth: Payer: Self-pay | Admitting: *Deleted

## 2013-01-23 ENCOUNTER — Ambulatory Visit (INDEPENDENT_AMBULATORY_CARE_PROVIDER_SITE_OTHER): Payer: Medicare PPO | Admitting: Family

## 2013-01-23 ENCOUNTER — Encounter: Payer: Self-pay | Admitting: Family

## 2013-01-23 VITALS — BP 162/90 | HR 62 | Temp 98.0°F | Resp 16 | Ht 64.0 in | Wt 223.1 lb

## 2013-01-23 DIAGNOSIS — R0789 Other chest pain: Secondary | ICD-10-CM

## 2013-01-23 DIAGNOSIS — I10 Essential (primary) hypertension: Secondary | ICD-10-CM

## 2013-01-23 MED ORDER — AMLODIPINE BESYLATE 10 MG PO TABS: 10.0000 mg | ORAL_TABLET | Freq: Every day | ORAL | Status: DC

## 2013-01-23 MED ORDER — OMEPRAZOLE-SODIUM BICARBONATE 40-1100 MG PO CAPS: 1.0000 | ORAL_CAPSULE | Freq: Every day | ORAL | Status: DC

## 2013-01-23 MED ORDER — NAPROXEN 500 MG PO TABS: 500.0000 mg | ORAL_TABLET | Freq: Two times a day (BID) | ORAL | Status: DC | PRN

## 2013-01-23 MED ORDER — TRAMADOL HCL 50 MG PO TABS: 50.0000 mg | ORAL_TABLET | Freq: Three times a day (TID) | ORAL | Status: DC | PRN

## 2013-01-23 NOTE — Assessment & Plan Note (Signed)
Reproducible anterior chest wall tenderness to palpation. Suspect chostrocondritis.  Plan short course of naproxen.  She has tolerated aleve and ibuprofen in the past.  Suspect that her odynophagia and substernal radiating pain are due to her GERD and gastroparesis. She follows with Dr. Arlyce Dice from GI.  Will try changing dexilant to zegerid and have pt resume reglan- she tells me she has reglan at home.  EKG is performed in the office today.  No significant change is noted as compared to EKG from last week.  Doubt cardiac etiology.  However, I did advise pt to go to the ED if worsening chest pain occurs or if she develops shortness of breath and she verbalizes understanding.

## 2013-01-23 NOTE — Telephone Encounter (Signed)
Rx printed and was called [employer]  Notified pt.

## 2013-01-23 NOTE — Progress Notes (Signed)
Subjective:    Patient ID: [name], female    DOB: [birth year], 52 y.o.   MRN: [id number]  HPI  [name] is a 52 yr old female who presents today for follow up of her blood pressure.  1) HTN-  Last visit amlodipine and hctz were added to her regimen due to elevated blood pressure.  2) Atypical CP- She reports that her chest pain is worse.  It radiates between the shoulder blades.  Pain is constant.  No alleviating or aggravating factors.  She denies associated SOB. She denies associated nausea.  She reports + odynophagia.  She also reports some associated chest wall tenderness to palpation.   Review of Systems See HPI  Past Medical History  Diagnosis Date  . Atypical chest pain   . Hyperlipidemia   . Hypertension   . GERD (gastroesophageal reflux disease)   . Diabetes mellitus, type II   . Constipation   . Routine general medical examination at a health care facility   . Gastroparesis   . Iron deficiency anemia   . Esophageal stricture   . Migraine headache   . Endometriosis   . Cyst, ovarian   . Peptic ulcer disease   . Non-compliance   . Allergy     allergic rhinitis    History   Social History  . Marital Status: Married    Spouse Name: N/A    Number of Children: 2  . Years of Education: N/A   Occupational History  . DISABILITY    Social History Main Topics  . Smoking status: Former Smoker    Quit date: 06/25/1994  . Smokeless tobacco: Never Used     Comment: Quit in 1996  . Alcohol Use: Yes     Comment: social drinker  . Drug Use: No  . Sexually Active: Not on file   Other Topics Concern  . Not on file   Social History Narrative   Married    Has 2 grown children.  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   Former Smoker Quit tobacco in 1996.  She was a pack a day smoker for approximately 10 years.   Alcohol use-yes: Social    Daily Caffeine Use:6 pack of pepsi daily     Illicit Drug Use - no    Patient does not get regular exercise.       Smoking Status:  quit    Past Surgical History  Procedure Laterality Date  . Abdominal exploration surgery      w/bso   . Knee surgery  2005     left knee  . Total abdominal hysterectomy    . Cholecystectomy    . Cardiac catheterization  2009    mild non obstructive CAD    Family History  Problem Relation Age of Onset  . Hypertension Mother   . Hypertension Father   . Diabetes Father   . Prostate cancer Father   . Kidney disease Father   . Diabetes Sister   . Diabetes Maternal Grandmother   . Colon cancer Neg Hx   . Lung cancer Maternal Grandfather     Allergies  Allergen Reactions  . Ace Inhibitors     REACTION: chronic cough  . Celebrex (Celecoxib) Other (See Comments)    "makes me bleed"  . Diovan (Valsartan)     angioedema    Current Outpatient Prescriptions on File Prior to Visit  Medication Sig Dispense Refill  . cyclobenzaprine (FLEXERIL) 5 MG tablet Take by mouth  as needed.      Marland Kitchen dexlansoprazole (DEXILANT) 60 MG capsule Take 60 mg by mouth as needed.      . dicyclomine (BENTYL) 10 MG capsule Take 10 mg by mouth as needed.       . diphenhydrAMINE (BENADRYL) 25 MG tablet Take 2 tablets (50 mg total) by mouth every 6 (six) hours.  20 tablet  0  . gabapentin (NEURONTIN) 100 MG capsule Take 100 mg by mouth Three times a day. Only takes when she wants to      . hydrochlorothiazide (HYDRODIURIL) 25 MG tablet Take 1 tablet (25 mg total) by mouth daily.  30 tablet  2  . HYDROcodone-acetaminophen (NORCO) 7.5-325 MG per tablet Take 1 tablet by mouth every 8 (eight) hours as needed.       . ondansetron (ZOFRAN-ODT) 8 MG disintegrating tablet 8 mg. Take 1 tablet (8 mg total) by mouth every 8 (eight) hours as needed for Nausea.      . polyethylene glycol (MIRALAX / GLYCOLAX) packet Take 17 g by mouth as needed.      . predniSONE (DELTASONE) 20 MG tablet Tale 3 tablets per day for 2 days, then take 2 tablets per day for 2 days, then take 1 tablet per day for 2 days.  12  tablet  0  . propranolol (INDERAL) 40 MG tablet Take one daily for a week, then one twice/day for a week, then increase by one tab each week up to 2 in am and 2 in pm      . SUMAtriptan (IMITREX) 50 MG tablet Take by mouth Every 2 hours as needed.       No current facility-administered medications on file prior to visit.    BP 162/90  Pulse 62  Temp(Src) 98 F (36.7 C) (Oral)  Resp 16  Ht 5\' 4"  (1.626 m)  Wt 223 lb 1.3 oz (101.188 kg)  BMI 38.27 kg/m2  SpO2 99%  .     Objective:   Physical Exam  Constitutional: She is oriented to person, place, and time. She appears well-developed and well-nourished. No distress.  HENT:  Head: Normocephalic and atraumatic.  Cardiovascular: Normal rate and regular rhythm.   No murmur heard. Pulmonary/Chest: Effort normal and breath sounds normal. No respiratory distress. She has no wheezes. She has no rales. She exhibits no tenderness.  Abdominal: Soft. Bowel sounds are normal.  Mild epigastric tenderness without guarding.   Musculoskeletal:  + reproducible left anterior chest wall tenderness to palpation.   Lymphadenopathy:    She has no cervical adenopathy.  Neurological: She is alert and oriented to person, place, and time.  Skin: Skin is warm and dry.  Psychiatric: She has a normal mood and affect. Her behavior is normal. Judgment and thought content normal.          Assessment & Plan:

## 2013-01-23 NOTE — Assessment & Plan Note (Signed)
Improved but not yet at goal. Increase amlodipine from 5-10 mg.

## 2013-01-23 NOTE — Telephone Encounter (Signed)
Pt wants to know if we can call in tramadol or a muscle relaxer for her back pain?  Please advise.

## 2013-01-23 NOTE — Patient Instructions (Addendum)
Increase amlodipine to 10mg .  Stop Dexilant, start zegerid. Restart reglan. We will arrange follow up with Dr. Arlyce Dice. You may start naproxen- twice daily as needed for your chest wall pain. Go to ER if worsening chest pain or if symptoms do not improve.

## 2013-01-23 NOTE — Telephone Encounter (Signed)
Rx sent for tramadol. Do not take with imitrex due to possible drug interaction.

## 2013-02-02 ENCOUNTER — Encounter: Payer: Self-pay | Admitting: Physician Assistant

## 2013-02-02 ENCOUNTER — Ambulatory Visit (INDEPENDENT_AMBULATORY_CARE_PROVIDER_SITE_OTHER): Payer: Medicare PPO | Admitting: Physician Assistant

## 2013-02-02 VITALS — BP 126/80 | HR 62 | Temp 98.7°F | Resp 14 | Wt 226.8 lb

## 2013-02-02 DIAGNOSIS — H9319 Tinnitus, unspecified ear: Secondary | ICD-10-CM

## 2013-02-02 DIAGNOSIS — H9311 Tinnitus, right ear: Secondary | ICD-10-CM | POA: Insufficient documentation

## 2013-02-02 NOTE — Assessment & Plan Note (Signed)
Daily Claritin.  Trial of Salt-restricted diet.  Tinnitus may be secondary to recent viral URI.  Encouraged white noise therapy.  If symptoms persist, further workup or ENT referral may be necessary

## 2013-02-02 NOTE — Patient Instructions (Addendum)
Try a daily Claritin.  Afrin nasal spray x 3 days (Do not use for more than 3 days).  Read information below about low salt diet.  Avoid use of q-tips to clean ears and this can push the wax against the eardrum, causing hearing loss and a ringing sensation in ear.  If this persists, we may need to refer you back to Dr. Esmeralda Arthur for further evaluation.  Sodium-Controlled Diet Sodium is a mineral. It is found in many foods. Sodium may be found naturally or added during the making of a food. The most common form of sodium is salt, which is made up of sodium and chloride. Reducing your sodium intake involves changing your eating habits. The following guidelines will help you reduce the sodium in your diet:  Stop using the salt shaker.  Use salt sparingly in cooking and baking.  Substitute with sodium-free seasonings and spices.  Do not use a salt substitute (potassium chloride) without your caregiver's permission.  Include a variety of fresh, unprocessed foods in your diet.  Limit the use of processed and convenience foods that are high in sodium. USE THE FOLLOWING FOODS SPARINGLY: Breads/Starches  Commercial bread stuffing, commercial pancake or waffle mixes, coating mixes. Waffles. Croutons. Prepared (boxed or frozen) potato, rice, or noodle mixes that contain salt or sodium. Salted [place] fries or hash browns. Salted popcorn, breads, crackers, chips, or snack foods. Vegetables  Vegetables canned with salt or prepared in cream, butter, or cheese sauces. Sauerkraut. Tomato or vegetable juices canned with salt.  Fresh vegetables are allowed if rinsed thoroughly. Fruit  Fruit is okay to eat. Meat and Meat Substitutes  Salted or smoked meats, such as bacon or Canadian bacon, chipped or corned beef, hot dogs, salt pork, luncheon meats, pastrami, ham, or sausage. Canned or smoked fish, poultry, or meat. Processed cheese or cheese spreads, blue or Roquef[place] cheese. Battered or frozen fish  products. Prepared spaghetti sauce. Baked beans. Reuben sandwiches. Salted nuts. Caviar. Milk  Limit buttermilk to 1 cup per week. Soups and Combination Foods  Bouillon cubes, canned or dried soups, broth, consomm. Convenience (frozen or packaged) dinners with more than 600 mg sodium. Pot pies, pizza, Asian food, fast food cheeseburgers, and specialty sandwiches. Desserts and Sweets  Regular (salted) desserts, pie, commercial fruit snack pies, commercial snack cakes, canned puddings.  Eat desserts and sweets in moderation. Fats and Oils  Gravy mixes or canned gravy. No more than 1 to 2 tbs of salad dressing. Chip dips.  Eat fats and oils in moderation. Beverages  See those listed under the vegetables and milk groups. Condiments  Ketchup, mustard, meat sauces, salsa, regular (salted) and lite soy sauce or mustard. Dill pickles, olives, meat tenderizer. Prepared horseradish or pickle relish. Dutch-processed cocoa. Baking powder or baking soda used medicinally. Worcestershire sauce. "Light" salt. Salt substitute, unless approved by your caregiver. Document Released: 12/01/2001 Document Revised: 09/03/2011 Document Reviewed: 07/04/2009 [facility] Patient Information 2014 [place], [place].    Tinnitus Sounds you hear in your ears and coming from within the ear is called tinnitus. This can be a symptom of many ear disorders. It is often associated with hearing loss.  Tinnitus can be seen with:  Infections.  Ear blockages such as wax buildup.  Meniere's disease.  Ear damage.  Inherited.  Occupational causes. While irritating, it is not usually a threat to health. When the cause of the tinnitus is wax, infection in the middle ear, or foreign body it is easily treated. Hearing loss will usually be  reversible.  TREATMENT  When treating the underlying cause does not get rid of tinnitus, it may be necessary to get rid of the unwanted sound by covering it up with more pleasant  background noises. This may include music, the radio etc. There are tinnitus maskers which can be worn which produce background noise to cover up the tinnitus. Avoid all medications which tend to make tinnitus worse such as alcohol, caffeine, aspirin, and nicotine. There are many soothing background tapes such as rain, ocean, thunderstorms, etc. These soothing sounds help with sleeping or resting. Keep all follow-up appointments and referrals. This is important to identify the cause of the problem. It also helps avoid complications, impaired hearing, disability, or chronic pain. Document Released: 06/11/2005 Document Revised: 09/03/2011 Document Reviewed: 01/28/2008 [facility] Patient Information 2014 [place], [place].

## 2013-02-02 NOTE — Progress Notes (Signed)
Patient ID: [name], female   DOB: 1960/09/05, 52 y.o.   MRN: [id number]    Patient is a 52 year-old female who presents to clinic today c/o ringing in her R ear for the past week.  Patient states there is a constant humming sound in her right ear.  Denies recent sickness.  Endorses some prior hearing impairment of R ear.  Denies nausea, vomiting, dizziness.  Denies ear pain or drainage.  Denies tinnitus of L ear.  Denies Q-tip use for ear cleaning.  Has hx of HTN that is well-controlled with medication.  Denies excessive salt intake.  Denies trauma to the ear.  Denies noise exposure.  Otherwise been feeling well. Endorses history of seasonal allergies.   Past Medical History  Diagnosis Date  . Atypical chest pain   . Hyperlipidemia   . Hypertension   . GERD (gastroesophageal reflux disease)   . Diabetes mellitus, type II   . Constipation   . Routine general medical examination at a health care facility   . Gastroparesis   . Iron deficiency anemia   . Esophageal stricture   . Migraine headache   . Endometriosis   . Cyst, ovarian   . Peptic ulcer disease   . Non-compliance   . Allergy     allergic rhinitis   Current Outpatient Prescriptions on File Prior to Visit  Medication Sig Dispense Refill  . amLODipine (NORVASC) 10 MG tablet Take 1 tablet (10 mg total) by mouth daily.  30 tablet  2  . cyclobenzaprine (FLEXERIL) 5 MG tablet Take by mouth as needed.      . dicyclomine (BENTYL) 10 MG capsule Take 10 mg by mouth as needed.       . diphenhydrAMINE (BENADRYL) 25 MG tablet Take 2 tablets (50 mg total) by mouth every 6 (six) hours.  20 tablet  0  . gabapentin (NEURONTIN) 100 MG capsule Take 100 mg by mouth Three times a day. Only takes when she wants to      . hydrochlorothiazide (HYDRODIURIL) 25 MG tablet Take 1 tablet (25 mg total) by mouth daily.  30 tablet  2  . HYDROcodone-acetaminophen (NORCO) 7.5-325 MG per tablet Take 1 tablet by mouth every 8 (eight) hours as needed.        . metoCLOPramide (REGLAN) 10 MG tablet Take 10 mg by mouth 3 (three) times daily.      . naproxen (NAPROSYN) 500 MG tablet Take 1 tablet (500 mg total) by mouth 2 (two) times daily as needed.  20 tablet  0  . omeprazole-sodium bicarbonate (ZEGERID) 40-1100 MG per capsule Take 1 capsule by mouth daily before breakfast.  30 capsule  2  . ondansetron (ZOFRAN-ODT) 8 MG disintegrating tablet 8 mg. Take 1 tablet (8 mg total) by mouth every 8 (eight) hours as needed for Nausea.      . polyethylene glycol (MIRALAX / GLYCOLAX) packet Take 17 g by mouth as needed.      . propranolol (INDERAL) 40 MG tablet Take one daily for a week, then one twice/day for a week, then increase by one tab each week up to 2 in am and 2 in pm      . SUMAtriptan (IMITREX) 50 MG tablet Take by mouth Every 2 hours as needed.      . traMADol (ULTRAM) 50 MG tablet Take 1 tablet (50 mg total) by mouth every 8 (eight) hours as needed for pain.  20 tablet  0   No current facility-administered  medications on file prior to visit.   Allergies  Allergen Reactions  . Ace Inhibitors     REACTION: chronic cough  . Celebrex (Celecoxib) Other (See Comments)    "makes me bleed"  . Diovan (Valsartan)     angioedema   Family History  Problem Relation Age of Onset  . Hypertension Mother   . Hypertension Father   . Diabetes Father   . Prostate cancer Father   . Kidney disease Father   . Diabetes Sister   . Diabetes Maternal Grandmother   . Colon cancer Neg Hx   . Lung cancer Maternal Grandfather    History   Social History  . Marital Status: Married    Spouse Name: N/A    Number of Children: 2  . Years of Education: N/A   Occupational History  . DISABILITY    Social History Main Topics  . Smoking status: Former Smoker    Quit date: 06/25/1994  . Smokeless tobacco: Never Used     Comment: Quit in 1996  . Alcohol Use: Yes     Comment: social drinker  . Drug Use: No  . Sexually Active: None   Other Topics Concern   . None   Social History Narrative   Married    Has 2 grown children.  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   Former Smoker Quit tobacco in 1996.  She was a pack a day smoker for approximately 10 years.   Alcohol use-yes: Social    Daily Caffeine Use:6 pack of pepsi daily     Illicit Drug Use - no    Patient does not get regular exercise.       Smoking Status:  quit   Review of Systems  Constitutional: Negative for fever, chills, weight loss and malaise/fatigue.  HENT: Positive for hearing loss and tinnitus. Negative for ear pain, congestion and ear discharge.   Respiratory: Negative for cough, sputum production, shortness of breath and wheezing.   Skin: Negative for rash.  Neurological: Negative for dizziness, loss of consciousness and headaches.  Endo/Heme/Allergies: Positive for environmental allergies.    Filed Vitals:   02/02/13 1530  BP: 126/80  Pulse: 62  Temp: 98.7 F (37.1 C)  Resp: 14   Physical Exam  Vitals reviewed. Constitutional: She is oriented to person, place, and time and well-developed, well-nourished, and in no distress.  HENT:  Head: Normocephalic and atraumatic.  Right Ear: External ear normal.  Left Ear: External ear normal.  Nose: Nose normal.  Mouth/Throat: Oropharynx is clear and moist. No oropharyngeal exudate.  TM WNL bilaterally; no evidence on perforation or cerumen impaction  Eyes: Conjunctivae are normal. Pupils are equal, round, and reactive to light.  Neck: Normal range of motion. Neck supple.  Cardiovascular: Normal rate, regular rhythm and normal heart sounds.   Pulmonary/Chest: Effort normal and breath sounds normal. No respiratory distress. She has no wheezes. She has no rales. She exhibits no tenderness.  Lymphadenopathy:    She has no cervical adenopathy.  Neurological: She is alert and oriented to person, place, and time. No cranial nerve deficit.    Assessment/Plan: No problem-specific assessment & plan notes found for  this encounter.

## 2013-02-03 ENCOUNTER — Ambulatory Visit: Payer: Medicare PPO | Admitting: Physician Assistant

## 2013-02-19 ENCOUNTER — Telehealth: Payer: Self-pay | Admitting: *Deleted

## 2013-02-19 DIAGNOSIS — H9313 Tinnitus, bilateral: Secondary | ICD-10-CM

## 2013-02-19 NOTE — Telephone Encounter (Signed)
i will refer her to ENT.

## 2013-02-19 NOTE — Telephone Encounter (Signed)
Received call from pt stating she continues to have ringing in her ear. Was seen for this on 02/02/13 and claritin and afrin not helping. Please advise.

## 2013-02-20 NOTE — Telephone Encounter (Signed)
Notified pt and she states she already sees Dr Esmeralda Arthur and will contact them for appt.

## 2013-03-02 ENCOUNTER — Ambulatory Visit: Payer: Medicare PPO | Admitting: Gastroenterology

## 2013-03-20 ENCOUNTER — Ambulatory Visit (HOSPITAL_BASED_OUTPATIENT_CLINIC_OR_DEPARTMENT_OTHER): Payer: Medicare PPO | Admitting: Hematology & Oncology

## 2013-03-20 ENCOUNTER — Ambulatory Visit (HOSPITAL_BASED_OUTPATIENT_CLINIC_OR_DEPARTMENT_OTHER)
Admission: RE | Admit: 2013-03-20 | Discharge: 2013-03-20 | Disposition: A | Discharge status: Home / Self Care | Payer: Medicare PPO | Source: Ambulatory Visit | Attending: Hematology & Oncology | Admitting: Hematology & Oncology

## 2013-03-20 ENCOUNTER — Other Ambulatory Visit (HOSPITAL_BASED_OUTPATIENT_CLINIC_OR_DEPARTMENT_OTHER): Payer: Medicare PPO | Admitting: Lab

## 2013-03-20 VITALS — BP 144/78 | HR 60 | Temp 98.1°F | Resp 16 | Ht 64.0 in | Wt 223.0 lb

## 2013-03-20 DIAGNOSIS — K7689 Other specified diseases of liver: Secondary | ICD-10-CM | POA: Insufficient documentation

## 2013-03-20 DIAGNOSIS — R1012 Left upper quadrant pain: Secondary | ICD-10-CM

## 2013-03-20 DIAGNOSIS — D509 Iron deficiency anemia, unspecified: Secondary | ICD-10-CM

## 2013-03-20 DIAGNOSIS — R109 Unspecified abdominal pain: Secondary | ICD-10-CM | POA: Insufficient documentation

## 2013-03-20 DIAGNOSIS — Z9089 Acquired absence of other organs: Secondary | ICD-10-CM | POA: Insufficient documentation

## 2013-03-20 LAB — CBC WITH DIFFERENTIAL (CANCER CENTER ONLY)
BASO#: 0 10*3/uL (ref 0.0–0.2)
BASO%: 0.4 % (ref 0.0–2.0)
EOS%: 1.7 % (ref 0.0–7.0)
Eosinophils Absolute: 0.1 10*3/uL (ref 0.0–0.5)
HCT: 36.3 % (ref 34.8–46.6)
HGB: 11.3 g/dL — ABNORMAL LOW (ref 11.6–15.9)
LYMPH#: 2.3 10*3/uL (ref 0.9–3.3)
LYMPH%: 33.9 % (ref 14.0–48.0)
MCH: 23.2 pg — ABNORMAL LOW (ref 26.0–34.0)
MCHC: 31.1 g/dL — ABNORMAL LOW (ref 32.0–36.0)
MCV: 74 fL — ABNORMAL LOW (ref 81–101)
MONO#: 0.6 10*3/uL (ref 0.1–0.9)
MONO%: 9 % (ref 0.0–13.0)
NEUT#: 3.8 10*3/uL (ref 1.5–6.5)
NEUT%: 55 % (ref 39.6–80.0)
Platelets: 213 10*3/uL (ref 145–400)
RBC: 4.88 10*6/uL (ref 3.70–5.32)
RDW: 14.2 % (ref 11.1–15.7)
WBC: 6.9 10*3/uL (ref 3.9–10.0)

## 2013-03-20 LAB — IRON AND TIBC CHCC
%SAT: 39 % (ref 21–57)
Iron: 103 ug/dL (ref 41–142)
TIBC: 265 ug/dL (ref 236–444)
UIBC: 162 ug/dL (ref 120–384)

## 2013-03-20 LAB — CHCC SATELLITE - SMEAR

## 2013-03-20 LAB — FERRITIN CHCC: Ferritin: 1453 ng/ml — ABNORMAL HIGH (ref 9–269)

## 2013-03-20 MED ORDER — METOCLOPRAMIDE HCL 10 MG PO TABS: 10.0000 mg | ORAL_TABLET | Freq: Three times a day (TID) | ORAL | Status: DC

## 2013-03-20 NOTE — Progress Notes (Signed)
This office note has been dictated.

## 2013-03-24 ENCOUNTER — Telehealth: Payer: Self-pay | Admitting: *Deleted

## 2013-03-24 NOTE — Progress Notes (Signed)
DIAGNOSIS:  Recurrent iron-deficiency anemia.  CURRENT THERAPY:  IV iron as indicated.  INTERIM HISTORY:  [name] comes in for followup.  She is feeling tired.  She just has fatigue and gets worn out easily.  She has not had any obvious bleeding.  She does not have monthly cycles any longer.  She is not chewing any ice.  When we last saw her, her ferritin was 944 with an iron saturation of 39%.  She has the unusual skin lesions.  These have been biopsied.  She is not sure what these lesions are.  She does not take any kind of medications for these.  I am not sure if she sees a dermatologist.  She has noted that a couple lesions look a little bit larger.  I recommend that she go see a dermatologist to have this looked at.  PHYSICAL EXAMINATION:  General:  This is a mildly obese African American female in no obvious distress.  Vital Signs:  Temperature 98.1, pulse 60, respiratory rate 16, blood pressure 144/78.  Weight is 223.  Head and Neck:  Normocephalic, atraumatic skull.  There are no ocular or oral lesions.  There are no palpable cervical or supraclavicular lymph nodes. Lungs:  Clear to percussion and auscultation bilaterally.  Cardiac: Regular rate and rhythm with a normal S1, S2.  She had a 1/6 systolic ejection murmur.  Abdomen:  Soft.  She has good bowel sounds.  There is no fluid wave.  There is no palpable abdominal mass.  There is no palpable hepatosplenomegaly.  Extremities:  Shows no clubbing, cyanosis or edema.  Neurological:  Shows no focal neurological deficits.  LABORATORY STUDIES:  White cell count is 6.9, hemoglobin 11.3, hematocrit 36.3, platelet count 213.  MCV is 74.  Peripheral smear does show an increase in microcytic red cells.  She has no rouleaux formation.  There may be a couple of target cells.  I see no schistocytes or spherocytes.  There is no nucleated red blood cells. White cells appear normal in morphology and maturation.  She has  no hypersegmented polys.  I do not see any immature myeloid or lymphoid forms.  Platelets are adequate in number and size.  IMPRESSION:  [name] is a very charming 52 year old African American female with anemia.  She has microcytic anemia.  We have done hemoglobin electrophoresis on her.  She has hemoglobin A.  I do not see any evidence of thalassemia.  We will go ahead and see what her iron studies show.  It has been a year and a half, almost that she has had any iron.  We will call her with the iron results.  I will plan to try to get back in about 6 weeks or so for followup.  I do not see a need for a bone marrow test that need to be done.  I reviewed the blood smear myself.  Again, she may be bordering on iron deficiency again.    ______________________________ Josph Macho, M.D. PRE/MEDQ  D:  03/20/2013  T:  03/24/2013  Job:  [id number]

## 2013-03-24 NOTE — Telephone Encounter (Addendum)
Message copied by Mirian Capuchin on Tue Mar 24, 2013  3:33 PM ------      Message from: Josph Macho      Created: Fri Mar 20, 2013  3:03 PM       Call - the US of the abdomen is ok!!  Nothing that looks concerning!!  Pete ------Pt called.  Voiced understanding.

## 2013-05-05 ENCOUNTER — Telehealth: Payer: Self-pay | Admitting: *Deleted

## 2013-05-05 NOTE — Telephone Encounter (Signed)
OK to send meds as below please.  Will need ov if no improvement in symptoms in 1 week.

## 2013-05-05 NOTE — Telephone Encounter (Signed)
Pt called requesting refill of tramadol and cyclobenzaprine.  Last tramadol rx 01/23/13.  Pt was on cyclobenzaprine in 2013.  Pt states she thinks she irritated her bulging discs recently while trying to care for her husband who has now been placed in a nursing home.  Please advise.

## 2013-05-06 NOTE — Telephone Encounter (Signed)
Pharmacy line busy, will try again

## 2013-05-08 ENCOUNTER — Ambulatory Visit: Payer: Medicare PPO | Admitting: Family

## 2013-05-08 DIAGNOSIS — Z0289 Encounter for other administrative examinations: Secondary | ICD-10-CM

## 2013-05-08 MED ORDER — CYCLOBENZAPRINE HCL 5 MG PO TABS: 5.0000 mg | ORAL_TABLET | Freq: Three times a day (TID) | ORAL | Status: DC | PRN

## 2013-05-08 MED ORDER — TRAMADOL HCL 50 MG PO TABS: 50.0000 mg | ORAL_TABLET | Freq: Three times a day (TID) | ORAL | Status: DC | PRN

## 2013-05-08 NOTE — Telephone Encounter (Signed)
Rx called to pharmacy voicemail. Notified pt. 

## 2013-05-20 ENCOUNTER — Other Ambulatory Visit (HOSPITAL_BASED_OUTPATIENT_CLINIC_OR_DEPARTMENT_OTHER): Payer: Medicare PPO | Admitting: Lab

## 2013-05-20 ENCOUNTER — Ambulatory Visit (HOSPITAL_BASED_OUTPATIENT_CLINIC_OR_DEPARTMENT_OTHER): Payer: Medicare PPO | Admitting: Hematology & Oncology

## 2013-05-20 VITALS — BP 149/87 | HR 85 | Temp 98.6°F | Resp 14 | Ht 64.0 in | Wt 221.0 lb

## 2013-05-20 DIAGNOSIS — D509 Iron deficiency anemia, unspecified: Secondary | ICD-10-CM

## 2013-05-20 LAB — CBC WITH DIFFERENTIAL (CANCER CENTER ONLY)
BASO#: 0 10*3/uL (ref 0.0–0.2)
BASO%: 0.1 % (ref 0.0–2.0)
EOS%: 0.8 % (ref 0.0–7.0)
Eosinophils Absolute: 0.1 10*3/uL (ref 0.0–0.5)
HCT: 38.2 % (ref 34.8–46.6)
HGB: 11.9 g/dL (ref 11.6–15.9)
LYMPH#: 2.2 10*3/uL (ref 0.9–3.3)
LYMPH%: 29.5 % (ref 14.0–48.0)
MCH: 23.1 pg — ABNORMAL LOW (ref 26.0–34.0)
MCHC: 31.2 g/dL — ABNORMAL LOW (ref 32.0–36.0)
MCV: 74 fL — ABNORMAL LOW (ref 81–101)
MONO#: 0.4 10*3/uL (ref 0.1–0.9)
MONO%: 4.9 % (ref 0.0–13.0)
NEUT#: 4.7 10*3/uL (ref 1.5–6.5)
NEUT%: 64.7 % (ref 39.6–80.0)
Platelets: 249 10*3/uL (ref 145–400)
RBC: 5.15 10*6/uL (ref 3.70–5.32)
RDW: 13.2 % (ref 11.1–15.7)
WBC: 7.3 10*3/uL (ref 3.9–10.0)

## 2013-05-20 LAB — RETICULOCYTES (CHCC)
ABS Retic: 52 10*3/uL (ref 19.0–186.0)
RBC.: 5.2 MIL/uL — ABNORMAL HIGH (ref 3.87–5.11)
Retic Ct Pct: 1 % (ref 0.4–2.3)

## 2013-05-20 LAB — CHCC SATELLITE - SMEAR

## 2013-05-20 LAB — IRON AND TIBC CHCC
%SAT: 23 % (ref 21–57)
Iron: 71 ug/dL (ref 41–142)
TIBC: 303 ug/dL (ref 236–444)
UIBC: 232 ug/dL (ref 120–384)

## 2013-05-20 LAB — FERRITIN CHCC: Ferritin: 1495 ng/ml — ABNORMAL HIGH (ref 9–269)

## 2013-05-20 NOTE — Progress Notes (Signed)
This office note has been dictated.

## 2013-05-26 ENCOUNTER — Telehealth: Payer: Self-pay | Admitting: *Deleted

## 2013-05-26 NOTE — Telephone Encounter (Signed)
Called patient to let her know that her iron levels are still ok

## 2013-05-26 NOTE — Telephone Encounter (Signed)
Message copied by Anselm Jungling on Tue May 26, 2013  1:54 PM ------      Message from: Josph Macho      Created: Fri May 22, 2013  4:24 PM       Please call and let her know that her iron is still okay. Thanks. Pete ------

## 2013-05-30 NOTE — Progress Notes (Signed)
DIAGNOSIS:  Recurrent iron-deficiency anemia.  CURRENT THERAPY:  IV iron as indicated.  INTERIM HISTORY:  [name] comes in for a followup.  Unfortunately, her husband is not doing well.  He is back in the hospital.  In fact, he may have had another stroke.  This has been a real problem for him.  She is totally stressed out over everything.  I think there are a lot of issues at home right now.  When we last saw her back in September, her ferritin was 1400; iron saturation was 39%; total iron was 103.  She has persistent microcytosis.  I think I may have to check for alpha thalassemia.  This certainly could be a part of why she has the macrocytic changes.  She has had no fever.  There has been no weight loss or weight gain. She states she is having a hard time keeping her food down.  She thinks a lot of this is from stress.  PHYSICAL EXAMINATION:  General:  This is a well-developed, well- nourished [place] American female, in no obvious distress.  Vital Signs: Temperature of 98.6, pulse 85, respiratory rate 14, blood pressure 149/87.  Her weight is 221 pounds.  Head and Neck:  Normocephalic, atraumatic skull.  There are no ocular or oral lesions.  There are no palpable cervical or supraclavicular lymph nodes.  Lungs:  Clear bilaterally.  Cardiac:  Regular rate and rhythm, with a normal S1, S2. There are no murmurs, rubs, or bruits.  Abdomen:  Soft.  She is mildly obese.  She has good bowel sounds.  There is no fluid wave.  There is no palpable abdominal mass.  There is no palpable hepatosplenomegaly. Back:  No tenderness over the spine, ribs, or hips.  Extremities:  Shows no clubbing, cyanosis, or edema.  She has good range motion of her joints.  She has some osteoarthritic changes in her joints.  Skin:  Exam shows these fibromas which are throughout her body.  Neurological:  No focal neurological deficits.  LABORATORY STUDIES:  White cell count is 7.3, hemoglobin  11.9, hematocrit 38.2, platelet count 249; MCV is 74.  IMPRESSION:  [name] is a very charming 52 year old African American female.  She has history of iron-deficiency anemia.  It has probably been almost a year since she had any IV iron.  Again, I think some of the microcytic changes might be from thalassemia. We will check her for alpha thalassemia.  I will continue to pray hard for her and her family.  Hopefully, her husband will get better.  I will plan to see her back myself in another 2 months or so.  Again, I would be surprised if we had to give her any IV iron.    ______________________________ Josph Macho, M.D. PRE/MEDQ  D:  05/20/2013  T:  05/29/2013  Job:  [id number]

## 2013-06-01 NOTE — Progress Notes (Signed)
DIAGNOSIS:  Recurrent iron deficiency anemia.  CURRENT THERAPY:  IV iron as indicated.  INTERIM HISTORY:  [name] comes in for followup.  She is feeling tired.  She just has fatigue and gets worn out easily.  She has not had any obvious bleeding.  She does not have monthly cycles any longer.  She is not chewing any ice.  When we last saw her, her ferritin was 944 with an iron saturation of 39%.  She has these unusual skin lesions.  These have been biopsied.  She is not sure what these lesions are.  She does not take any kind of medications for these.  I am not sure if she even sees a dermatologist.  She has noted that a couple of the lesions look little bit larger.  I recommend that she go and see a dermatologist to have this looked at.  PHYSICAL EXAMINATION:  General:  This is a mildly obese [place] American female, in no obvious distress.  Vital signs:  Her temperature is 98.1, pulse 60, respiratory rate 16, blood pressure 144/78.  Weight is 223. Head and Neck:  Shows a normocephalic, atraumatic skull.  There are no ocular or oral lesions.  There are no palpable cervical or supraclavicular lymph nodes.  Lungs:  Clear to percussion and auscultation bilaterally.  Cardiac Exam:  Regular rate and rhythm with a normal S1 and S2.  She has a 1/6 systolic ejection murmur.  Abdomen: Soft.  She has good bowel sounds.  There is no fluid wave.  There is no palpable abdominal mass.  There is no palpable hepatosplenomegaly. Extremities:  Show no clubbing, cyanosis or edema.  Neurological:  Shows no focal neurological deficits.  LABORATORY STUDIES:  White cell count is 6.9, hemoglobin 11.3, hematocrit 36.3, platelet count 213.  MCV is 74.  Peripheral smear does show an increase in microcytic red cells.  She has no rouleaux formation.  There may be a couple of target cells.  I see no schistocytes or spherocytes.  There are no nucleated red blood cells. White cells appear normal in  morphology and maturation.  She has no hypersegmented polys.  I do not see any immature myeloid or lymphoid forms.  Platelets are adequate in number and size.  IMPRESSION:  [name] is a very charming 52 year old African American female with anemia.  She has microcytic anemia.  We have done hemoglobin electrophoresis on her.  She has hemoglobin A. I do not see any evidence of thalassemia.  We will go ahead and see what the iron studies show.  It has been a year and a half almost that she has had any iron.  We will call her with the iron results.  I will plan to probably get her back in about 6 weeks or so for followup.  I do not see the need for any bone marrow tests that need to be done.  I reviewed the blood smear myself.  Again, she may be bordering iron deficiency again.    ______________________________ Josph Macho, M.D. PRE/MEDQ  D:  03/20/2013  T:  05/31/2013  Job:  [id number]

## 2013-07-29 ENCOUNTER — Encounter: Payer: Self-pay | Admitting: Hematology & Oncology

## 2013-07-29 ENCOUNTER — Other Ambulatory Visit (HOSPITAL_BASED_OUTPATIENT_CLINIC_OR_DEPARTMENT_OTHER): Payer: Medicare PPO | Admitting: Lab

## 2013-07-29 ENCOUNTER — Ambulatory Visit (HOSPITAL_BASED_OUTPATIENT_CLINIC_OR_DEPARTMENT_OTHER): Payer: Medicare PPO | Admitting: Hematology & Oncology

## 2013-07-29 ENCOUNTER — Other Ambulatory Visit: Payer: Self-pay | Admitting: *Deleted

## 2013-07-29 VITALS — BP 179/94 | HR 69 | Temp 97.7°F | Resp 14 | Ht 65.0 in | Wt 223.0 lb

## 2013-07-29 DIAGNOSIS — D509 Iron deficiency anemia, unspecified: Secondary | ICD-10-CM

## 2013-07-29 LAB — CBC WITH DIFFERENTIAL (CANCER CENTER ONLY)
BASO#: 0 10*3/uL (ref 0.0–0.2)
BASO%: 0.2 % (ref 0.0–2.0)
EOS%: 1.5 % (ref 0.0–7.0)
Eosinophils Absolute: 0.1 10*3/uL (ref 0.0–0.5)
HCT: 38.2 % (ref 34.8–46.6)
HGB: 12 g/dL (ref 11.6–15.9)
LYMPH#: 3 10*3/uL (ref 0.9–3.3)
LYMPH%: 48.8 % — ABNORMAL HIGH (ref 14.0–48.0)
MCH: 23.1 pg — ABNORMAL LOW (ref 26.0–34.0)
MCHC: 31.4 g/dL — ABNORMAL LOW (ref 32.0–36.0)
MCV: 74 fL — ABNORMAL LOW (ref 81–101)
MONO#: 0.3 10*3/uL (ref 0.1–0.9)
MONO%: 5.5 % (ref 0.0–13.0)
NEUT#: 2.7 10*3/uL (ref 1.5–6.5)
NEUT%: 44 % (ref 39.6–80.0)
Platelets: 220 10*3/uL (ref 145–400)
RBC: 5.19 10*6/uL (ref 3.70–5.32)
RDW: 14 % (ref 11.1–15.7)
WBC: 6.1 10*3/uL (ref 3.9–10.0)

## 2013-07-29 LAB — IRON AND TIBC CHCC
%SAT: 35 % (ref 21–57)
Iron: 91 ug/dL (ref 41–142)
TIBC: 260 ug/dL (ref 236–444)
UIBC: 169 ug/dL (ref 120–384)

## 2013-07-29 LAB — CHCC SATELLITE - SMEAR

## 2013-07-29 LAB — FERRITIN CHCC: Ferritin: 1301 ng/ml — ABNORMAL HIGH (ref 9–269)

## 2013-07-29 MED ORDER — CYCLOBENZAPRINE HCL 5 MG PO TABS: 5.0000 mg | ORAL_TABLET | Freq: Three times a day (TID) | ORAL | Status: DC | PRN

## 2013-07-29 NOTE — Progress Notes (Signed)
This office note has been dictated.

## 2013-07-30 NOTE — Progress Notes (Signed)
DIAGNOSIS:  Recurrent iron-deficiency anemia.  CURRENT THERAPY:  IV iron as indicated.  INTERIM HISTORY:  [name] comes in for followup.  She is doing okay. Unfortunately, her husband is in the nursing home.  He has dementia.  He has had strokes in the past.  [name], herself, is doing pretty well.  We have not getting her on for over a year.  When we last saw her, her ferritin was 15:00 with an iron saturation of only 23%.  Again, the ferritin elevation mostly is an acute phase reactant.  She does have lot of other health issues.  She has had no bleeding or bruising.  There has been no cough or shortness of breath.  She has had no bony pain.  Her last mammogram that I have on her was back in December of 2013.  PHYSICAL EXAMINATION:  General:  This is a somewhat mildly obese black female, in no obvious distress.  Vital Signs:  Temperature of 97.7, pulse 69, respiratory rate 14, blood pressure 179/94.  Weight is 223 pounds.  Head and Neck:  Normocephalic, atraumatic skull.  There are no ocular or oral lesions.  There are no palpable cervical or supraclavicular lymph nodes.  Lungs:  Clear bilaterally.  Cardiac: Regular rate and rhythm with a normal S1 and S2.  There are no murmurs, rubs, or bruits.  Abdomen:  Soft.  She has good bowel sounds.  There is no fluid wave.  There is no palpable abdominal mass.  There is no palpable hepatosplenomegaly.  Extremities:  No clubbing, cyanosis, or edema.  Neurological:  No focal neurological deficits.  LABORATORY STUDIES:  White cell count is 6.1, hemoglobin 12, hematocrit 38, platelet count 220.  MCV is 74.  IMPRESSION:  [name] is a very nice 53 year old African American female.  She has iron-deficiency anemia.  I suspect that she probably does have thalassemia.  We will check a genetic profile for alpha thalassemia.  From my point of view, I still do not think she needs any iron.  We will plan to get her back in another 3  months.    ______________________________ [name], M.D. PRE/MEDQ  D:  07/29/2013  T:  07/30/2013  Job:  [id number]

## 2013-08-07 LAB — ALPHA-THALASSEMIA GENOTYPR

## 2013-08-07 LAB — TRANSFERRIN RECEPTOR, SOLUABLE: Transferrin Receptor, Soluble: 0.79 mg/L (ref 0.76–1.76)

## 2013-09-28 ENCOUNTER — Telehealth: Payer: Self-pay | Admitting: *Deleted

## 2013-09-28 NOTE — Telephone Encounter (Signed)
Left detailed message on home# that she needs to schedule appt for further refills.

## 2013-09-28 NOTE — Telephone Encounter (Signed)
Pt needs office visit prior to additional refills please.

## 2013-09-28 NOTE — Telephone Encounter (Signed)
Pt left message on voicemail requesting refills of tramadol and cyclobenzaprine.  Tramadol last filled by [place] on 05/05/13, #20 and cyclobezaprine 07/29/13, #20. Pt was last seen for routine follow up on 01/23/13 and has not future appts with [place] on file.  Would should pt follow up?

## 2013-10-02 ENCOUNTER — Encounter: Payer: Self-pay | Admitting: Family

## 2013-10-02 ENCOUNTER — Ambulatory Visit (INDEPENDENT_AMBULATORY_CARE_PROVIDER_SITE_OTHER): Payer: Medicare PPO | Admitting: Family

## 2013-10-02 ENCOUNTER — Ambulatory Visit (HOSPITAL_BASED_OUTPATIENT_CLINIC_OR_DEPARTMENT_OTHER)
Admission: RE | Admit: 2013-10-02 | Discharge: 2013-10-02 | Disposition: A | Discharge status: Home / Self Care | Payer: Medicare PPO | Source: Ambulatory Visit | Attending: Family | Admitting: Family

## 2013-10-02 ENCOUNTER — Other Ambulatory Visit: Payer: Self-pay | Admitting: Family

## 2013-10-02 VITALS — BP 160/100 | HR 71 | Temp 98.0°F | Resp 16 | Ht 64.0 in | Wt 225.1 lb

## 2013-10-02 DIAGNOSIS — Z1231 Encounter for screening mammogram for malignant neoplasm of breast: Secondary | ICD-10-CM | POA: Insufficient documentation

## 2013-10-02 DIAGNOSIS — E785 Hyperlipidemia, unspecified: Secondary | ICD-10-CM

## 2013-10-02 DIAGNOSIS — Z0000 Encounter for general adult medical examination without abnormal findings: Secondary | ICD-10-CM

## 2013-10-02 DIAGNOSIS — I10 Essential (primary) hypertension: Secondary | ICD-10-CM

## 2013-10-02 DIAGNOSIS — E119 Type 2 diabetes mellitus without complications: Secondary | ICD-10-CM

## 2013-10-02 DIAGNOSIS — R7309 Other abnormal glucose: Secondary | ICD-10-CM

## 2013-10-02 LAB — BASIC METABOLIC PANEL WITH GFR
BUN: 21 mg/dL (ref 6–23)
CO2: 28 mEq/L (ref 19–32)
Calcium: 9.1 mg/dL (ref 8.4–10.5)
Chloride: 101 mEq/L (ref 96–112)
Creat: 0.74 mg/dL (ref 0.50–1.10)
GFR, Est African American: >89 mL/min
GFR, Est Non African American: >89 mL/min
Glucose, Bld: 96 mg/dL (ref 70–99)
Potassium: 3.6 mEq/L (ref 3.5–5.3)
Sodium: 138 mEq/L (ref 135–145)

## 2013-10-02 LAB — LIPID PANEL
Cholesterol: 296 mg/dL — ABNORMAL HIGH (ref 0–200)
HDL: 54 mg/dL (ref >39)
LDL Cholesterol: 214 mg/dL — ABNORMAL HIGH (ref 0–99)
Total CHOL/HDL Ratio: 5.5 Ratio
Triglycerides: 140 mg/dL (ref <150)
VLDL: 28 mg/dL (ref 0–40)

## 2013-10-02 LAB — HEPATIC FUNCTION PANEL
ALT: 22 U/L (ref 0–35)
AST: 19 U/L (ref 0–37)
Albumin: 3.7 g/dL (ref 3.5–5.2)
Alkaline Phosphatase: 102 U/L (ref 39–117)
Bilirubin, Direct: 0.1 mg/dL (ref 0.0–0.3)
Indirect Bilirubin: 0.3 mg/dL (ref 0.2–1.2)
Total Bilirubin: 0.4 mg/dL (ref 0.2–1.2)
Total Protein: 6.9 g/dL (ref 6.0–8.3)

## 2013-10-02 LAB — HEMOGLOBIN A1C
Hgb A1c MFr Bld: 5.9 % — ABNORMAL HIGH (ref <5.7)
Mean Plasma Glucose: 123 mg/dL — ABNORMAL HIGH (ref <117)

## 2013-10-02 MED ORDER — TRAMADOL HCL 50 MG PO TABS: 50.0000 mg | ORAL_TABLET | Freq: Three times a day (TID) | ORAL | Status: DC | PRN

## 2013-10-02 MED ORDER — AMLODIPINE BESYLATE 5 MG PO TABS: 5.0000 mg | ORAL_TABLET | Freq: Every day | ORAL | Status: DC

## 2013-10-02 MED ORDER — CYCLOBENZAPRINE HCL 5 MG PO TABS: 5.0000 mg | ORAL_TABLET | Freq: Three times a day (TID) | ORAL | Status: DC | PRN

## 2013-10-02 NOTE — Assessment & Plan Note (Signed)
Uncontrolled. Add amlodipine.  

## 2013-10-02 NOTE — Assessment & Plan Note (Signed)
Due for A1C, urine microalbumin. Refer to opthalmology. Add low dose aspirin for cardiac prevention.

## 2013-10-02 NOTE — Patient Instructions (Addendum)
You will be contacted about your referral to the eye doctor.  Start amlodipine once daily for blood pressure. Work on low sodium diet.   Start aspirin 81mg  once daily. Follow up in 2 weeks so we can recheck your blood pressure.

## 2013-10-02 NOTE — Progress Notes (Signed)
Pre visit review using our clinic review tool, if applicable. No additional management support is needed unless otherwise documented below in the visit note. 

## 2013-10-02 NOTE — Progress Notes (Signed)
Subjective:    Patient ID: [name], female    DOB: 12-27-1960, 53 y.o.   MRN: [id number]  HPI  [name] is a 53 yr old female who presents today for follow up of multiple medical problems.  1) HTN- current BP meds include hctz. Reports increased stress recently due to caring for her husband who has suffered a debilitatiing stroke.  BP Readings from Last 3 Encounters:  10/02/13 160/100  07/29/13 179/94  05/20/13 149/87   2) Hyperlipidemia- she has not had a recent Lipid panel.   3) DM2- She is due for A1C.  Has been diet controlled.   Review of Systems See HPI  Past Medical History  Diagnosis Date  . Atypical chest pain   . Hyperlipidemia   . Hypertension   . GERD (gastroesophageal reflux disease)   . Diabetes mellitus, type II   . Constipation   . Routine general medical examination at a health care facility   . Gastroparesis   . Iron deficiency anemia   . Esophageal stricture   . Migraine headache   . Endometriosis   . Cyst, ovarian   . Peptic ulcer disease   . Non-compliance   . Allergy     allergic rhinitis    History   Social History  . Marital Status: Married    Spouse Name: N/A    Number of Children: 2  . Years of Education: N/A   Occupational History  . DISABILITY    Social History Main Topics  . Smoking status: Former Smoker -- 0.50 packs/day for 18 years    Types: Cigarettes    Start date: 07/29/1977    Quit date: 06/25/1994  . Smokeless tobacco: Never Used     Comment: Quit in 1996  . Alcohol Use: Yes     Comment: social drinker  . Drug Use: No  . Sexual Activity: Not on file   Other Topics Concern  . Not on file   Social History Narrative   Married    Has 2 grown children.  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   Former Smoker Quit tobacco in 1996.  She was a pack a day smoker for approximately 10 years.   Alcohol use-yes: Social    Daily Caffeine Use:6 pack of pepsi daily     Illicit Drug Use - no    Patient does  not get regular exercise.       Smoking Status:  quit    Past Surgical History  Procedure Laterality Date  . Abdominal exploration surgery      w/bso   . Knee surgery  2005     left knee  . Total abdominal hysterectomy    . Cholecystectomy    . Cardiac catheterization  2009    mild non obstructive CAD    Family History  Problem Relation Age of Onset  . Hypertension Mother   . Hypertension Father   . Diabetes Father   . Prostate cancer Father   . Kidney disease Father   . Diabetes Sister   . Diabetes Maternal Grandmother   . Colon cancer Neg Hx   . Lung cancer Maternal Grandfather     Allergies  Allergen Reactions  . Ace Inhibitors     REACTION: chronic cough  . Celebrex [Celecoxib] Other (See Comments)    "makes me bleed"  . Diovan [Valsartan]     angioedema    Current Outpatient Prescriptions on File Prior to Visit  Medication Sig  Dispense Refill  . cyclobenzaprine (FLEXERIL) 5 MG tablet Take 1 tablet (5 mg total) by mouth 3 (three) times daily as needed for muscle spasms.  20 tablet  0  . diphenhydrAMINE (BENADRYL) 25 MG tablet Take 2 tablets (50 mg total) by mouth every 6 (six) hours.  20 tablet  0  . gabapentin (NEURONTIN) 100 MG capsule Take 100 mg by mouth Three times a day. Only takes when she wants to      . hydrochlorothiazide (HYDRODIURIL) 25 MG tablet Take 1 tablet (25 mg total) by mouth daily.  30 tablet  2  . HYDROcodone-acetaminophen (NORCO) 7.5-325 MG per tablet Take 1 tablet by mouth every 8 (eight) hours as needed.       Marland Kitchen omeprazole-sodium bicarbonate (ZEGERID) 40-1100 MG per capsule Take 1 capsule by mouth daily before breakfast.       . PHENADOZ 25 MG suppository Place 25 mg rectally as needed.       . polyethylene glycol (MIRALAX / GLYCOLAX) packet Take 17 g by mouth as needed.      . traMADol (ULTRAM) 50 MG tablet Take 1 tablet (50 mg total) by mouth every 8 (eight) hours as needed.  20 tablet  0   No current facility-administered medications  on file prior to visit.    BP 160/100  Pulse 71  Temp(Src) 98 F (36.7 C) (Oral)  Resp 16  Ht 5\' 4"  (1.626 m)  Wt 225 lb 1.9 oz (102.114 kg)  BMI 38.62 kg/m2  SpO2 98%       Objective:   Physical Exam  Constitutional: She is oriented to person, place, and time. She appears well-developed and well-nourished. No distress.  HENT:  Head: Normocephalic and atraumatic.  Cardiovascular: Normal rate and regular rhythm.   No murmur heard. Pulmonary/Chest: Effort normal and breath sounds normal. No respiratory distress. She has no wheezes. She has no rales. She exhibits no tenderness.  Neurological: She is alert and oriented to person, place, and time.  Psychiatric: She has a normal mood and affect. Her behavior is normal. Judgment and thought content normal.          Assessment & Plan:

## 2013-10-02 NOTE — Assessment & Plan Note (Signed)
Obtain flp/lft today.

## 2013-10-02 NOTE — Assessment & Plan Note (Signed)
BP remains uncontrolled.

## 2013-10-03 LAB — MICROALBUMIN / CREATININE URINE RATIO
Creatinine, Urine: 213 mg/dL
Microalb Creat Ratio: 4.1 mg/g (ref 0.0–30.0)
Microalb, Ur: 0.88 mg/dL (ref 0.00–1.89)

## 2013-10-04 ENCOUNTER — Telehealth: Payer: Self-pay | Admitting: Family

## 2013-10-04 DIAGNOSIS — E785 Hyperlipidemia, unspecified: Secondary | ICD-10-CM

## 2013-10-04 MED ORDER — ATORVASTATIN CALCIUM 80 MG PO TABS: 80.0000 mg | ORAL_TABLET | Freq: Every day | ORAL | Status: DC

## 2013-10-04 NOTE — Telephone Encounter (Signed)
Cholesterol is very high. Sugar is mildly elevated. She should work on low fat/low cholesterol diet and exercise and weight loss.   I would like her to start atorvastatin once daily and repeat FLP/LFT in 6 weeks.

## 2013-10-05 NOTE — Telephone Encounter (Signed)
Notified pt and she is agreeable to proceed with medication. Lab order entered.

## 2013-10-14 ENCOUNTER — Other Ambulatory Visit: Payer: Self-pay | Admitting: Hematology & Oncology

## 2013-10-16 ENCOUNTER — Encounter: Payer: Self-pay | Admitting: Gastroenterology

## 2013-10-26 ENCOUNTER — Telehealth: Payer: Self-pay | Admitting: Hematology & Oncology

## 2013-10-26 ENCOUNTER — Other Ambulatory Visit: Payer: Medicare PPO | Admitting: Lab

## 2013-10-26 ENCOUNTER — Ambulatory Visit: Payer: Medicare PPO | Admitting: Hematology & Oncology

## 2013-10-26 NOTE — Telephone Encounter (Signed)
Pt aware of 5-11 appointment °

## 2013-10-29 ENCOUNTER — Telehealth: Payer: Self-pay | Admitting: Hematology & Oncology

## 2013-10-29 NOTE — Telephone Encounter (Signed)
Pt moved 5-11 to 6-18

## 2013-11-02 ENCOUNTER — Other Ambulatory Visit: Payer: Medicare PPO | Admitting: Lab

## 2013-11-02 ENCOUNTER — Ambulatory Visit: Payer: Medicare PPO | Admitting: Hematology & Oncology

## 2013-11-02 ENCOUNTER — Telehealth: Payer: Self-pay | Admitting: Family

## 2013-11-02 DIAGNOSIS — E785 Hyperlipidemia, unspecified: Secondary | ICD-10-CM

## 2013-11-02 LAB — HEPATIC FUNCTION PANEL
ALT: 38 U/L — ABNORMAL HIGH (ref 0–35)
AST: 26 U/L (ref 0–37)
Albumin: 4.4 g/dL (ref 3.5–5.2)
Alkaline Phosphatase: 171 U/L — ABNORMAL HIGH (ref 39–117)
Bilirubin, Direct: 0.1 mg/dL (ref 0.0–0.3)
Indirect Bilirubin: 0.3 mg/dL (ref 0.2–1.2)
Total Bilirubin: 0.4 mg/dL (ref 0.2–1.2)
Total Protein: 7.5 g/dL (ref 6.0–8.3)

## 2013-11-02 LAB — LIPID PANEL
Cholesterol: 169 mg/dL (ref 0–200)
HDL: 51 mg/dL (ref >39)
LDL Cholesterol: 100 mg/dL — ABNORMAL HIGH (ref 0–99)
Total CHOL/HDL Ratio: 3.3 Ratio
Triglycerides: 91 mg/dL (ref <150)
VLDL: 18 mg/dL (ref 0–40)

## 2013-11-02 MED ORDER — TRAMADOL HCL 50 MG PO TABS: ORAL_TABLET | ORAL | Status: DC

## 2013-11-02 MED ORDER — CYCLOBENZAPRINE HCL 5 MG PO TABS: ORAL_TABLET | ORAL | Status: DC

## 2013-11-02 MED ORDER — HYDROCHLOROTHIAZIDE 25 MG PO TABS
25.0000 mg | ORAL_TABLET | Freq: Every day | ORAL | Status: DC
Start: 2013-11-02 — End: 2014-01-15

## 2013-11-02 NOTE — Telephone Encounter (Signed)
Pt requesting refills.

## 2013-11-06 ENCOUNTER — Telehealth: Payer: Self-pay | Admitting: *Deleted

## 2013-11-06 DIAGNOSIS — R748 Abnormal levels of other serum enzymes: Secondary | ICD-10-CM

## 2013-11-06 NOTE — Telephone Encounter (Signed)
Notified pt and she voices understanding. Lab order entered. 

## 2013-11-06 NOTE — Telephone Encounter (Signed)
Message copied by Ronny Flurry on Fri Nov 06, 2013  4:26 PM ------      Message from: O'SULLIVAN, MELISSA      Created: Tue Nov 03, 2013  5:48 PM       Cholesterol is much improved.  Down to 169 form 296.  Please continue current dose of Atorvastatin.  Alk phos-one of the liver tests is elevated. Repeat lft in 1 month dx elevated alk phos. ------

## 2013-12-10 ENCOUNTER — Encounter: Payer: Self-pay | Admitting: Hematology & Oncology

## 2013-12-10 ENCOUNTER — Telehealth: Payer: Self-pay | Admitting: Family

## 2013-12-10 ENCOUNTER — Other Ambulatory Visit (HOSPITAL_BASED_OUTPATIENT_CLINIC_OR_DEPARTMENT_OTHER): Payer: Medicare PPO | Admitting: Lab

## 2013-12-10 ENCOUNTER — Ambulatory Visit (HOSPITAL_BASED_OUTPATIENT_CLINIC_OR_DEPARTMENT_OTHER): Payer: Medicare PPO | Admitting: Hematology & Oncology

## 2013-12-10 VITALS — BP 123/80 | HR 76 | Temp 97.4°F | Resp 14 | Ht 64.0 in | Wt 217.0 lb

## 2013-12-10 DIAGNOSIS — D509 Iron deficiency anemia, unspecified: Secondary | ICD-10-CM

## 2013-12-10 LAB — CBC WITH DIFFERENTIAL (CANCER CENTER ONLY)
BASO#: 0 10*3/uL (ref 0.0–0.2)
BASO%: 0.2 % (ref 0.0–2.0)
EOS%: 1.3 % (ref 0.0–7.0)
Eosinophils Absolute: 0.1 10*3/uL (ref 0.0–0.5)
HCT: 38.2 % (ref 34.8–46.6)
HGB: 12.3 g/dL (ref 11.6–15.9)
LYMPH#: 3.5 10*3/uL — ABNORMAL HIGH (ref 0.9–3.3)
LYMPH%: 38.7 % (ref 14.0–48.0)
MCH: 23.4 pg — ABNORMAL LOW (ref 26.0–34.0)
MCHC: 32.2 g/dL (ref 32.0–36.0)
MCV: 73 fL — ABNORMAL LOW (ref 81–101)
MONO#: 0.5 10*3/uL (ref 0.1–0.9)
MONO%: 5.5 % (ref 0.0–13.0)
NEUT#: 4.9 10*3/uL (ref 1.5–6.5)
NEUT%: 54.3 % (ref 39.6–80.0)
Platelets: 244 10*3/uL (ref 145–400)
RBC: 5.26 10*6/uL (ref 3.70–5.32)
RDW: 13.7 % (ref 11.1–15.7)
WBC: 8.9 10*3/uL (ref 3.9–10.0)

## 2013-12-10 LAB — CHCC SATELLITE - SMEAR

## 2013-12-10 LAB — RETICULOCYTES (CHCC)
ABS Retic: 37.2 10*3/uL (ref 19.0–186.0)
RBC.: 5.32 MIL/uL — ABNORMAL HIGH (ref 3.87–5.11)
Retic Ct Pct: 0.7 % (ref 0.4–2.3)

## 2013-12-10 NOTE — Telephone Encounter (Signed)
She needsa some hydrocodone  The tramadol is not working

## 2013-12-10 NOTE — Progress Notes (Signed)
Hematology and Oncology Follow Up Visit  ZONNIQUE NORKUS [id number] 1960-08-25 53 y.o. 12/10/2013   Principle Diagnosis:   Recurrent iron deficiency anemia  Current Therapy:    IV iron as indicated     Interim History:  Ms.  Bewick is back for followup. She's been doing pre-well. Her husband is now home. He has had multiple strokes. He has dementia. She's doing good job trying to help take care of him.  We last saw her back in February. At that time, her ferritin was 1300. Her iron saturation was 35%.  She's had no bleeding. Had no weight loss weight gain. Her appetite has been okay. There's been no medicine changes.   Her last oh gram was done back in April there is no okay.  She's had no cough. She's had no bony pain. She has occasional arthralgias and myalgias.        Medications: Current outpatient prescriptions:amLODipine (NORVASC) 5 MG tablet, Take 1 tablet (5 mg total) by mouth daily., Disp: 30 tablet, Rfl: 0;  aspirin EC 81 MG tablet, Take 81 mg by mouth daily., Disp: , Rfl: ;  atorvastatin (LIPITOR) 80 MG tablet, Take 1 tablet (80 mg total) by mouth daily., Disp: 30 tablet, Rfl: 3;  cyclobenzaprine (FLEXERIL) 5 MG tablet, TAKE ONE TABLET BY MOUTH THREE TIMES DAILY AS NEEDED, Disp: 90 tablet, Rfl: 2 diphenhydrAMINE (BENADRYL) 25 MG tablet, Take 2 tablets (50 mg total) by mouth every 6 (six) hours., Disp: 20 tablet, Rfl: 0;  gabapentin (NEURONTIN) 100 MG capsule, Take 100 mg by mouth Three times a day. Only takes when she wants to, Disp: , Rfl: ;  hydrochlorothiazide (HYDRODIURIL) 25 MG tablet, Take 1 tablet (25 mg total) by mouth daily., Disp: 30 tablet, Rfl: 2;  PHENADOZ 25 MG suppository, Place 25 mg rectally as needed. , Disp: , Rfl:  polyethylene glycol (MIRALAX / GLYCOLAX) packet, Take 17 g by mouth as needed., Disp: , Rfl: ;  traMADol (ULTRAM) 50 MG tablet, TAKE ONE TABLET BY MOUTH EVERY 8 HOURS AS NEEDED, Disp: 90 tablet, Rfl: 0  Allergies:  Allergies  Allergen  Reactions  . Ace Inhibitors     REACTION: chronic cough  . Celebrex [Celecoxib] Other (See Comments)    "makes me bleed"  . Diovan [Valsartan]     angioedema    Past Medical History, Surgical history, Social history, and Family History were reviewed and updated.  Review of Systems: As above  Physical Exam:  height is 5\' 4"  (1.626 m) and weight is 217 lb (98.431 kg). Her oral temperature is 97.4 F (36.3 C). Her blood pressure is 123/80 and her pulse is 76. Her respiration is 14.   Well-developed and well-nourished Afro-American female. Lungs are clear. Cardiac exam regular rate rhythm with no murmurs rubs or bruits. Abdomen is soft. His she has good bowel sounds. There is no fluid wave. There is no palpable liver or spleen tip. Back exam shows no tenderness over the spine ribs or hips. Extremities shows no clubbing cyanosis or edema. Skin exam shows these papular lesions on her extremities.  Lab Results  Component Value Date   WBC 8.9 12/10/2013   HGB 12.3 12/10/2013   HCT 38.2 12/10/2013   MCV 73* 12/10/2013   PLT 244 12/10/2013     Chemistry      Component Value Date/Time   NA 138 10/02/2013 0821   K 3.6 10/02/2013 0821   CL 101 10/02/2013 0821   CO2 28 10/02/2013 6283  BUN 21 10/02/2013 0821   CREATININE 0.74 10/02/2013 0821   CREATININE 0.80 09/18/2012 1133      Component Value Date/Time   CALCIUM 9.1 10/02/2013 0821   ALKPHOS 171* 11/02/2013 1452   AST 26 11/02/2013 1452   ALT 38* 11/02/2013 1452   BILITOT 0.4 11/02/2013 1452         Impression and Plan: Ms. Speirs is 53 year old African American female with recurrent iron deficiency anemia. So far, she's been doing quite well. We have not had to give her iron now for one year and a half. The last time she was back in January of 2014.  We will plan for another formal followup.  I do not see that any additional studies need to be done.  [name], MD 6/18/20153:20 PM

## 2013-12-11 LAB — IRON AND TIBC CHCC
%SAT: 23 % (ref 21–57)
Iron: 61 ug/dL (ref 41–142)
TIBC: 261 ug/dL (ref 236–444)
UIBC: 200 ug/dL (ref 120–384)

## 2013-12-11 LAB — FERRITIN CHCC: Ferritin: 1239 ng/ml — ABNORMAL HIGH (ref 9–269)

## 2013-12-14 NOTE — Telephone Encounter (Signed)
Patient stated that pain is in her lower back. Patient is scheduling ov appt.

## 2013-12-14 NOTE — Telephone Encounter (Signed)
Please advise 

## 2013-12-14 NOTE — Telephone Encounter (Signed)
Where is she experiencing pain currently?  I would like to see her back in the office to evaluate prior to changing meds please.

## 2013-12-15 ENCOUNTER — Ambulatory Visit (INDEPENDENT_AMBULATORY_CARE_PROVIDER_SITE_OTHER): Payer: Medicare PPO | Admitting: Family

## 2013-12-15 ENCOUNTER — Encounter: Payer: Self-pay | Admitting: Family

## 2013-12-15 VITALS — BP 120/70 | HR 87 | Temp 98.0°F | Ht 64.0 in | Wt 213.0 lb

## 2013-12-15 DIAGNOSIS — E785 Hyperlipidemia, unspecified: Secondary | ICD-10-CM

## 2013-12-15 DIAGNOSIS — D509 Iron deficiency anemia, unspecified: Secondary | ICD-10-CM

## 2013-12-15 DIAGNOSIS — M545 Low back pain, unspecified: Secondary | ICD-10-CM

## 2013-12-15 DIAGNOSIS — I10 Essential (primary) hypertension: Secondary | ICD-10-CM

## 2013-12-15 DIAGNOSIS — R7309 Other abnormal glucose: Secondary | ICD-10-CM

## 2013-12-15 MED ORDER — METHYLPREDNISOLONE 4 MG PO KIT: PACK | ORAL | Status: DC

## 2013-12-15 MED ORDER — PROMETHAZINE HCL 25 MG RE SUPP: 25.0000 mg | RECTAL | Status: DC | PRN

## 2013-12-15 NOTE — Patient Instructions (Signed)
Please start medrol dose pak. Contact Neurosurgery to schedule follow up. Follow up in 3 months, sooner if problems/concerns.

## 2013-12-15 NOTE — Progress Notes (Signed)
Pre visit review using our clinic review tool, if applicable. No additional management support is needed unless otherwise documented below in the visit note. 

## 2013-12-15 NOTE — Assessment & Plan Note (Signed)
BP stable on current meds. Continue same.  

## 2013-12-15 NOTE — Assessment & Plan Note (Signed)
Worsened. Rx with medrol dose pak, prn flexeril. Pt to arrange follow up visit with neurosurgeon.

## 2013-12-15 NOTE — Assessment & Plan Note (Signed)
>>  ASSESSMENT AND PLAN FOR LOW BACK PAIN WRITTEN ON 12/15/2013  7:46 AM BY O'SULLIVAN, Errin Chewning, NP  Worsened. Rx with medrol  dose pak, prn flexeril . Pt to arrange follow up visit with neurosurgeon.

## 2013-12-15 NOTE — Assessment & Plan Note (Signed)
Stable. Advised continue low cholesterol diet, continue statin.

## 2013-12-15 NOTE — Assessment & Plan Note (Signed)
Blood count stable. Management per hematology.

## 2013-12-15 NOTE — Assessment & Plan Note (Signed)
Stable. Advised pt to check sugar once daily while on medrol dose pak and call if sugar >250.

## 2013-12-15 NOTE — Progress Notes (Signed)
Subjective:    Patient ID: [name], female    DOB: 01/10/1961, 53 y.o.   MRN: [id number]  HPI  [name] is a 53 yr old female who presents today for follow up of multiple medical problems:  1) HTN-  Current BP meds include amlodipine, hctz.  Denies CP, SOB or swelling. BP Readings from Last 3 Encounters:  12/15/13 120/70  12/10/13 123/80  10/02/13 160/100    2) DM2 borderline- this has been diet controlled.   Lab Results  Component Value Date   HGBA1C 5.9* 10/02/2013   3) Iron deficiency Anemia-  Recent hemoglobin was normal. She is followed by Dr. Marin Olp- hematology. Lab Results  Component Value Date   WBC 8.9 12/10/2013   HGB 12.3 12/10/2013   HCT 38.2 12/10/2013   MCV 73* 12/10/2013   PLT 244 12/10/2013   4) hyperlipidemia- she is maintained on atorvastatin.  Denies myalgia Lab Results  Component Value Date   CHOL 169 11/02/2013   HDL 51 11/02/2013   LDLCALC 100* 11/02/2013   LDLDIRECT 138.8 05/06/2009   TRIG 91 11/02/2013   CHOLHDL 3.3 11/02/2013       Low back pain- reports no improvement with tramadol. Reports that pain stays in her lower back- non-radiating.  Denies LE numbness/weakness, denies bowel/bladder dysfunction.  Pain is constant. No alleviating factors.  She had MRI of the lumbar spine 10/18/10- noted: Mild desiccation and bulging of the discs at L4-5 and L5-S1      Review of Systems See HPI  Past Medical History  Diagnosis Date  . Atypical chest pain   . Hyperlipidemia   . Hypertension   . GERD (gastroesophageal reflux disease)   . Diabetes mellitus, type II   . Constipation   . Routine general medical examination at a health care facility   . Gastroparesis   . Iron deficiency anemia   . Esophageal stricture   . Migraine headache   . Endometriosis   . Cyst, ovarian   . Peptic ulcer disease   . Non-compliance   . Allergy     allergic rhinitis    History   Social History  . Marital Status: Married    Spouse Name: N/A   Number of Children: 2  . Years of Education: N/A   Occupational History  . DISABILITY    Social History Main Topics  . Smoking status: Former Smoker -- 0.50 packs/day for 18 years    Types: Cigarettes    Start date: 07/29/1977    Quit date: 06/25/1994  . Smokeless tobacco: Never Used     Comment: quit 19 years ago  . Alcohol Use: Yes     Comment: social drinker  . Drug Use: No  . Sexual Activity: Not on file   Other Topics Concern  . Not on file   Social History Narrative   Married    Has 2 grown children.  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   Former Smoker Quit tobacco in 1996.  She was a pack a day smoker for approximately 10 years.   Alcohol use-yes: Social    Daily Caffeine Use:6 pack of pepsi daily     Illicit Drug Use - no    Patient does not get regular exercise.       Smoking Status:  quit    Past Surgical History  Procedure Laterality Date  . Abdominal exploration surgery      w/bso   . Knee surgery  2005  left knee  . Total abdominal hysterectomy    . Cholecystectomy    . Cardiac catheterization  2009    mild non obstructive CAD    Family History  Problem Relation Age of Onset  . Hypertension Mother   . Hypertension Father   . Diabetes Father   . Prostate cancer Father   . Kidney disease Father   . Diabetes Sister   . Diabetes Maternal Grandmother   . Colon cancer Neg Hx   . Lung cancer Maternal Grandfather     Allergies  Allergen Reactions  . Ace Inhibitors     REACTION: chronic cough  . Celebrex [Celecoxib] Other (See Comments)    "makes me bleed"  . Diovan [Valsartan]     angioedema    Current Outpatient Prescriptions on File Prior to Visit  Medication Sig Dispense Refill  . amLODipine (NORVASC) 5 MG tablet Take 1 tablet (5 mg total) by mouth daily.  30 tablet  0  . aspirin EC 81 MG tablet Take 81 mg by mouth daily.      Marland Kitchen atorvastatin (LIPITOR) 80 MG tablet Take 1 tablet (80 mg total) by mouth daily.  30 tablet  3  .  cyclobenzaprine (FLEXERIL) 5 MG tablet TAKE ONE TABLET BY MOUTH THREE TIMES DAILY AS NEEDED  90 tablet  2  . hydrochlorothiazide (HYDRODIURIL) 25 MG tablet Take 1 tablet (25 mg total) by mouth daily.  30 tablet  2  . PHENADOZ 25 MG suppository Place 25 mg rectally as needed.       . polyethylene glycol (MIRALAX / GLYCOLAX) packet Take 17 g by mouth as needed.      . traMADol (ULTRAM) 50 MG tablet TAKE ONE TABLET BY MOUTH EVERY 8 HOURS AS NEEDED  90 tablet  0   No current facility-administered medications on file prior to visit.    BP 120/70  Pulse 87  Temp(Src) 98 F (36.7 C) (Oral)  Ht 5\' 4"  (1.626 m)  Wt 213 lb (96.616 kg)  BMI 36.54 kg/m2  SpO2 98%       Objective:   Physical Exam  Constitutional: She is oriented to person, place, and time. She appears well-developed and well-nourished. No distress.  Cardiovascular: Normal rate and regular rhythm.   No murmur heard. Pulmonary/Chest: Effort normal and breath sounds normal. No respiratory distress. She has no wheezes. She has no rales. She exhibits no tenderness.  Musculoskeletal:       Cervical back: She exhibits no tenderness.       Thoracic back: She exhibits no tenderness.       Lumbar back: She exhibits tenderness.  Neurological: She is alert and oriented to person, place, and time.  Reflex Scores:      Patellar reflexes are 2+ on the right side and 2+ on the left side. Bilateral LE strength is 5/5          Assessment & Plan:

## 2013-12-16 ENCOUNTER — Telehealth: Payer: Self-pay | Admitting: *Deleted

## 2013-12-16 NOTE — Telephone Encounter (Signed)
Called patient to let her know that her iron levels are ok per dr. Marin Olp

## 2013-12-16 NOTE — Telephone Encounter (Signed)
Message copied by Rico Ala on Wed Dec 16, 2013 11:03 AM ------      Message from: Burney Gauze R      Created: Wed Dec 16, 2013  6:42 AM       Call -iron is ok!!  Laurey Arrow ------

## 2014-01-14 ENCOUNTER — Telehealth: Payer: Self-pay | Admitting: Family

## 2014-01-14 NOTE — Telephone Encounter (Addendum)
Requesting refill on amLODipine (NORVASC) 5 MG tablet, pharmacy will not give refill on hydrochlorothiazide (HYDRODIURIL) 25 MG tablet(does have refills on bottle) Please call [facility] on [address]  [phone]

## 2014-01-15 MED ORDER — HYDROCHLOROTHIAZIDE 25 MG PO TABS: 25.0000 mg | ORAL_TABLET | Freq: Every day | ORAL | Status: DC

## 2014-01-15 MED ORDER — AMLODIPINE BESYLATE 5 MG PO TABS: 5.0000 mg | ORAL_TABLET | Freq: Every day | ORAL | Status: DC

## 2014-01-15 NOTE — Telephone Encounter (Signed)
Dent. Patient did not have refills available. Refills sent to pharmacy.

## 2014-02-15 ENCOUNTER — Encounter: Payer: Self-pay | Admitting: Gastroenterology

## 2014-02-15 ENCOUNTER — Ambulatory Visit (INDEPENDENT_AMBULATORY_CARE_PROVIDER_SITE_OTHER): Payer: Medicare PPO

## 2014-02-15 DIAGNOSIS — Z23 Encounter for immunization: Secondary | ICD-10-CM

## 2014-02-17 ENCOUNTER — Telehealth: Payer: Self-pay | Admitting: Family

## 2014-02-17 DIAGNOSIS — K3184 Gastroparesis: Secondary | ICD-10-CM

## 2014-02-17 NOTE — Telephone Encounter (Signed)
Pt is needing [employer] referral to see Dr Deatra Ina, just will need dx code to do insurance referral.

## 2014-02-23 ENCOUNTER — Encounter: Payer: Self-pay | Admitting: Family

## 2014-03-16 ENCOUNTER — Ambulatory Visit: Payer: Medicare PPO | Admitting: Family

## 2014-03-17 ENCOUNTER — Encounter: Payer: Self-pay | Admitting: Family

## 2014-03-17 ENCOUNTER — Ambulatory Visit (INDEPENDENT_AMBULATORY_CARE_PROVIDER_SITE_OTHER): Payer: Medicare PPO | Admitting: Family

## 2014-03-17 VITALS — BP 146/90 | HR 73 | Temp 98.5°F | Resp 16 | Ht 64.0 in | Wt 217.5 lb

## 2014-03-17 DIAGNOSIS — Z23 Encounter for immunization: Secondary | ICD-10-CM

## 2014-03-17 DIAGNOSIS — R739 Hyperglycemia, unspecified: Secondary | ICD-10-CM

## 2014-03-17 DIAGNOSIS — M545 Low back pain, unspecified: Secondary | ICD-10-CM

## 2014-03-17 DIAGNOSIS — I10 Essential (primary) hypertension: Secondary | ICD-10-CM

## 2014-03-17 DIAGNOSIS — R7309 Other abnormal glucose: Secondary | ICD-10-CM

## 2014-03-17 DIAGNOSIS — D509 Iron deficiency anemia, unspecified: Secondary | ICD-10-CM

## 2014-03-17 DIAGNOSIS — E785 Hyperlipidemia, unspecified: Secondary | ICD-10-CM

## 2014-03-17 LAB — LIPID PANEL
Cholesterol: 271 mg/dL — ABNORMAL HIGH (ref 0–200)
HDL: 46.2 mg/dL (ref >39.00)
LDL Cholesterol: 188 mg/dL — ABNORMAL HIGH (ref 0–99)
NonHDL: 224.8
Total CHOL/HDL Ratio: 6
Triglycerides: 182 mg/dL — ABNORMAL HIGH (ref 0.0–149.0)
VLDL: 36.4 mg/dL (ref 0.0–40.0)

## 2014-03-17 LAB — BASIC METABOLIC PANEL
BUN: 16 mg/dL (ref 6–23)
CO2: 30 mEq/L (ref 19–32)
Calcium: 9.4 mg/dL (ref 8.4–10.5)
Chloride: 100 mEq/L (ref 96–112)
Creatinine, Ser: 0.7 mg/dL (ref 0.4–1.2)
GFR: 106.25 mL/min (ref >60.00)
Glucose, Bld: 107 mg/dL — ABNORMAL HIGH (ref 70–99)
Potassium: 3.2 mEq/L — ABNORMAL LOW (ref 3.5–5.1)
Sodium: 137 mEq/L (ref 135–145)

## 2014-03-17 LAB — HEMOGLOBIN A1C: Hgb A1c MFr Bld: 6.2 % (ref 4.6–6.5)

## 2014-03-17 NOTE — Assessment & Plan Note (Signed)
Obtain follow up a1c.  

## 2014-03-17 NOTE — Assessment & Plan Note (Signed)
Stable, management per hematology.  

## 2014-03-17 NOTE — Progress Notes (Signed)
Pre visit review using our clinic review tool, if applicable. No additional management support is needed unless otherwise documented below in the visit note/SLS  

## 2014-03-17 NOTE — Assessment & Plan Note (Signed)
>>  ASSESSMENT AND PLAN FOR LOW BACK PAIN WRITTEN ON 03/17/2014  8:42 AM BY O'SULLIVAN, Chitara Clonch, NP  Unchanged.  Advised pt to arrange follow up with neurosurgery.

## 2014-03-17 NOTE — Assessment & Plan Note (Signed)
BP above goal today.  Historically has been well controlled.  Will continue current meds. Consider adjusting next visit if elevated.

## 2014-03-17 NOTE — Progress Notes (Signed)
Subjective:    Patient ID: [name], female    DOB: [birth year], 53 y.o.   MRN: [id number]  HPI  [name] is a 53 yr old female who presents today for follow up of multiple medical problems:  1) HTN- currently maintained on amlodipine , hctz.  Denies CP/SOB or swelling.  BP Readings from Last 3 Encounters:  03/17/14 146/90  12/15/13 120/70  12/10/13 123/80   2) Hyperlipidemia-  Maintained on atorvastatin.  Denies myalgia Lab Results  Component Value Date   CHOL 169 11/02/2013   HDL 51 11/02/2013   LDLCALC 100* 11/02/2013   LDLDIRECT 138.8 05/06/2009   TRIG 91 11/02/2013   CHOLHDL 3.3 11/02/2013   3) Iron deficiency anemia- she is followed by hematology. Maintained on IV iron as needed.  She has upcoming appointment.   Lab Results  Component Value Date   WBC 8.9 12/10/2013   HGB 12.3 12/10/2013   HCT 38.2 12/10/2013   MCV 73* 12/10/2013   PLT 244 12/10/2013   4) Low back pain- hx of lumbar disc disease per MRI. Last visit pt noted worsening low back pain.  She was treated with medrol dose pak and prn flexeril.  She was advised to arrange follow up with her neurosurgeon. She has not contacted her neurosurgeon.     Review of Systems    see HPI  Past Medical History  Diagnosis Date  . Atypical chest pain   . Hyperlipidemia   . Hypertension   . GERD (gastroesophageal reflux disease)   . Diabetes mellitus, type II   . Constipation   . Routine general medical examination at a health care facility   . Gastroparesis   . Iron deficiency anemia   . Esophageal stricture   . Migraine headache   . Endometriosis   . Cyst, ovarian   . Peptic ulcer disease   . Non-compliance   . Allergy     allergic rhinitis    History   Social History  . Marital Status: Married    Spouse Name: N/A    Number of Children: 2  . Years of Education: N/A   Occupational History  . DISABILITY    Social History Main Topics  . Smoking status: Former Smoker -- 0.50 packs/day for 18  years    Types: Cigarettes    Start date: 07/29/1977    Quit date: 06/25/1994  . Smokeless tobacco: Never Used     Comment: quit 19 years ago  . Alcohol Use: Yes     Comment: social drinker  . Drug Use: No  . Sexual Activity: Not on file   Other Topics Concern  . Not on file   Social History Narrative   Married    Has 2 grown children.  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   Former Smoker Quit tobacco in 1996.  She was a pack a day smoker for approximately 10 years.   Alcohol use-yes: Social    Daily Caffeine Use:6 pack of pepsi daily     Illicit Drug Use - no    Patient does not get regular exercise.       Smoking Status:  quit    Past Surgical History  Procedure Laterality Date  . Abdominal exploration surgery      w/bso   . Knee surgery  2005     left knee  . Total abdominal hysterectomy    . Cholecystectomy    . Cardiac catheterization  2009  mild non obstructive CAD    Family History  Problem Relation Age of Onset  . Hypertension Mother   . Hypertension Father   . Diabetes Father   . Prostate cancer Father   . Kidney disease Father   . Diabetes Sister   . Diabetes Maternal Grandmother   . Colon cancer Neg Hx   . Lung cancer Maternal Grandfather     Allergies  Allergen Reactions  . Ace Inhibitors     REACTION: chronic cough  . Celebrex [Celecoxib] Other (See Comments)    "makes me bleed"  . Diovan [Valsartan]     angioedema    Current Outpatient Prescriptions on File Prior to Visit  Medication Sig Dispense Refill  . amLODipine (NORVASC) 5 MG tablet Take 1 tablet (5 mg total) by mouth daily.  30 tablet  2  . atorvastatin (LIPITOR) 80 MG tablet Take 1 tablet (80 mg total) by mouth daily.  30 tablet  3  . cyclobenzaprine (FLEXERIL) 5 MG tablet TAKE ONE TABLET BY MOUTH THREE TIMES DAILY AS NEEDED  90 tablet  2  . hydrochlorothiazide (HYDRODIURIL) 25 MG tablet Take 1 tablet (25 mg total) by mouth daily.  30 tablet  2  . polyethylene glycol  (MIRALAX / GLYCOLAX) packet Take 17 g by mouth as needed.      . promethazine (PHENADOZ) 25 MG suppository Place 1 suppository (25 mg total) rectally as needed.  12 each  5  . traMADol (ULTRAM) 50 MG tablet TAKE ONE TABLET BY MOUTH EVERY 8 HOURS AS NEEDED  90 tablet  0  . aspirin EC 81 MG tablet Take 81 mg by mouth daily.       No current facility-administered medications on file prior to visit.    BP 146/90  Pulse 73  Temp(Src) 98.5 F (36.9 C) (Oral)  Resp 16  Ht 5\' 4"  (1.626 m)  Wt 217 lb 8 oz (98.657 kg)  BMI 37.32 kg/m2  SpO2 100%    Objective:   Physical Exam  Constitutional: She is oriented to person, place, and time. She appears well-developed and well-nourished. No distress.  Cardiovascular: Normal rate and regular rhythm.   No murmur heard. Pulmonary/Chest: Effort normal and breath sounds normal. No respiratory distress. She has no wheezes. She has no rales. She exhibits no tenderness.  Neurological: She is alert and oriented to person, place, and time.  Skin:  Small cysts noted bilteral outer canthus          Assessment & Plan:  She will follow up with opthalmology for removal of cysts from outer canthus.

## 2014-03-17 NOTE — Patient Instructions (Signed)
Please complete lab work prior to leaving.   Please schedule a follow up appointment in 3 months.  

## 2014-03-17 NOTE — Assessment & Plan Note (Signed)
Unchanged.  Advised pt to arrange follow up with neurosurgery.

## 2014-03-17 NOTE — Assessment & Plan Note (Signed)
LDL 100, continue statin, obtain follow up lft.

## 2014-03-18 ENCOUNTER — Telehealth: Payer: Self-pay | Admitting: Family

## 2014-03-18 DIAGNOSIS — E785 Hyperlipidemia, unspecified: Secondary | ICD-10-CM

## 2014-03-18 DIAGNOSIS — E876 Hypokalemia: Secondary | ICD-10-CM

## 2014-03-18 MED ORDER — POTASSIUM CHLORIDE CRYS ER 20 MEQ PO TBCR: 20.0000 meq | EXTENDED_RELEASE_TABLET | Freq: Every day | ORAL | Status: DC

## 2014-03-18 NOTE — Telephone Encounter (Signed)
  Sugar still in borderline diabetes range. Cholesterol extremely high- has she been taking the lipitor?  If not she should resume.  Potassium is low.  Add potassium supplement once daily (rx sent to [facility]). Repeat bmet in 1 week, dx is hypokalemia. thanks

## 2014-03-19 MED ORDER — ROSUVASTATIN CALCIUM 20 MG PO TABS: 20.0000 mg | ORAL_TABLET | Freq: Every day | ORAL | Status: DC

## 2014-03-19 NOTE — Telephone Encounter (Signed)
Stop atorvastatin, start crestor, repeat FLP/LFT in 6 weeks, dx hyperlipidemia.

## 2014-03-19 NOTE — Telephone Encounter (Signed)
Notified pt and she voices understanding. Lab order entered and lab appt scheduled. Pt states she has been taking Lipitor daily.  Please advise.

## 2014-03-19 NOTE — Telephone Encounter (Signed)
Left message on voicemail to return my call.  

## 2014-03-22 NOTE — Telephone Encounter (Signed)
Notified pt and she voices understanding. Lab appt scheduled for 05/03/14 at 8:15 for lipids / LFTs.

## 2014-03-26 ENCOUNTER — Other Ambulatory Visit (INDEPENDENT_AMBULATORY_CARE_PROVIDER_SITE_OTHER): Payer: Medicare PPO

## 2014-03-26 DIAGNOSIS — E785 Hyperlipidemia, unspecified: Secondary | ICD-10-CM

## 2014-03-26 DIAGNOSIS — E876 Hypokalemia: Secondary | ICD-10-CM

## 2014-03-26 LAB — HEPATIC FUNCTION PANEL
ALT: 27 U/L (ref 0–35)
AST: 22 U/L (ref 0–37)
Albumin: 3.8 g/dL (ref 3.5–5.2)
Alkaline Phosphatase: 149 U/L — ABNORMAL HIGH (ref 39–117)
Bilirubin, Direct: 0 mg/dL (ref 0.0–0.3)
Total Bilirubin: 0.6 mg/dL (ref 0.2–1.2)
Total Protein: 7.8 g/dL (ref 6.0–8.3)

## 2014-03-26 LAB — LIPID PANEL
Cholesterol: 285 mg/dL — ABNORMAL HIGH (ref 0–200)
HDL: 44.1 mg/dL (ref >39.00)
LDL Cholesterol: 208 mg/dL — ABNORMAL HIGH (ref 0–99)
NonHDL: 240.9
Total CHOL/HDL Ratio: 6
Triglycerides: 165 mg/dL — ABNORMAL HIGH (ref 0.0–149.0)
VLDL: 33 mg/dL (ref 0.0–40.0)

## 2014-03-26 LAB — BASIC METABOLIC PANEL
BUN: 15 mg/dL (ref 6–23)
CO2: 30 mEq/L (ref 19–32)
Calcium: 9.4 mg/dL (ref 8.4–10.5)
Chloride: 101 mEq/L (ref 96–112)
Creatinine, Ser: 0.8 mg/dL (ref 0.4–1.2)
GFR: 95.59 mL/min (ref >60.00)
Glucose, Bld: 112 mg/dL — ABNORMAL HIGH (ref 70–99)
Potassium: 3.3 mEq/L — ABNORMAL LOW (ref 3.5–5.1)
Sodium: 138 mEq/L (ref 135–145)

## 2014-03-27 ENCOUNTER — Telehealth: Payer: Self-pay | Admitting: Family

## 2014-03-27 DIAGNOSIS — E876 Hypokalemia: Secondary | ICD-10-CM

## 2014-03-27 NOTE — Telephone Encounter (Addendum)
Potassium is still low. Increase kdur to 2 tabs by mouth once daily. Repeat bmet in 1 week.

## 2014-03-29 NOTE — Telephone Encounter (Signed)
Left detailed message on home # and to call and schedule lab appt. Future order entered.

## 2014-03-31 ENCOUNTER — Telehealth: Payer: Self-pay | Admitting: Family

## 2014-03-31 DIAGNOSIS — R748 Abnormal levels of other serum enzymes: Secondary | ICD-10-CM

## 2014-03-31 NOTE — Telephone Encounter (Signed)
pls let pt know that I reviewed her liver function tests and her alk phos remains elevated.  I would like her to meet with GI.   

## 2014-04-02 NOTE — Telephone Encounter (Signed)
Left detailed message on home# and to call if any questions. 

## 2014-04-08 ENCOUNTER — Encounter: Payer: Self-pay | Admitting: Family

## 2014-04-08 ENCOUNTER — Other Ambulatory Visit (HOSPITAL_BASED_OUTPATIENT_CLINIC_OR_DEPARTMENT_OTHER): Payer: Medicare PPO | Admitting: Lab

## 2014-04-08 ENCOUNTER — Ambulatory Visit (HOSPITAL_BASED_OUTPATIENT_CLINIC_OR_DEPARTMENT_OTHER): Payer: Medicare PPO | Admitting: Family

## 2014-04-08 VITALS — BP 145/85 | HR 66 | Temp 98.4°F | Resp 18 | Wt 218.0 lb

## 2014-04-08 DIAGNOSIS — D509 Iron deficiency anemia, unspecified: Secondary | ICD-10-CM

## 2014-04-08 LAB — CBC WITH DIFFERENTIAL (CANCER CENTER ONLY)
BASO#: 0 10*3/uL (ref 0.0–0.2)
BASO%: 0.3 % (ref 0.0–2.0)
EOS%: 1.3 % (ref 0.0–7.0)
Eosinophils Absolute: 0.1 10*3/uL (ref 0.0–0.5)
HCT: 36.5 % (ref 34.8–46.6)
HGB: 11.6 g/dL (ref 11.6–15.9)
LYMPH#: 2.3 10*3/uL (ref 0.9–3.3)
LYMPH%: 36.1 % (ref 14.0–48.0)
MCH: 23.3 pg — ABNORMAL LOW (ref 26.0–34.0)
MCHC: 31.8 g/dL — ABNORMAL LOW (ref 32.0–36.0)
MCV: 73 fL — ABNORMAL LOW (ref 81–101)
MONO#: 0.5 10*3/uL (ref 0.1–0.9)
MONO%: 7.6 % (ref 0.0–13.0)
NEUT#: 3.5 10*3/uL (ref 1.5–6.5)
NEUT%: 54.7 % (ref 39.6–80.0)
Platelets: 226 10*3/uL (ref 145–400)
RBC: 4.97 10*6/uL (ref 3.70–5.32)
RDW: 13.9 % (ref 11.1–15.7)
WBC: 6.3 10*3/uL (ref 3.9–10.0)

## 2014-04-08 LAB — RETICULOCYTES (CHCC)
ABS Retic: 49.8 10*3/uL (ref 19.0–186.0)
RBC.: 4.98 MIL/uL (ref 3.87–5.11)
Retic Ct Pct: 1 % (ref 0.4–2.3)

## 2014-04-08 LAB — IRON AND TIBC CHCC
%SAT: 26 % (ref 21–57)
Iron: 68 ug/dL (ref 41–142)
TIBC: 261 ug/dL (ref 236–444)
UIBC: 193 ug/dL (ref 120–384)

## 2014-04-08 LAB — FERRITIN CHCC: Ferritin: 952 ng/ml — ABNORMAL HIGH (ref 9–269)

## 2014-04-08 NOTE — Progress Notes (Signed)
[facility]  [phone] [phone]) [phone]  ID: [name] OB: 1961-03-15 MR#: [id number] WER#:[id number] Patient Care Team: [name], NP as PCP - General (Internal Medicine) [name], MD as Consulting Physician (Internal Medicine) Amalia Greenhouse, MD as Consulting Physician (Endocrinology)  DIAGNOSIS: Recurrent iron deficiency anemia  INTERVAL HISTORY: [name] is here today for follow-up. She is feeling tired. She is under a lot of stress at home. She takes care of her husband who has dementia. She has not had iron since 2014. She did well with it. She denies fever, chills, n/v, cough, rash, headache, dizziness, SOB, chest pain, palpitations, abdominal pain, constipation, diarrhea, abdominal pain, blood in urine or stool. She has had no swelling, tenderness, numbness or tingling in her extremities. No bleeding or pain. Her appetite is good and she is drinking plenty of fluids. Her weight is stable. Her last mammogram was in April and it was ok.   CURRENT TREATMENT: IV iron as indicated  REVIEW OF SYSTEMS: All other 10 point review of systems is negative.   PAST MEDICAL HISTORY: Past Medical History  Diagnosis Date  . Atypical chest pain   . Hyperlipidemia   . Hypertension   . GERD (gastroesophageal reflux disease)   . Diabetes mellitus, type II   . Constipation   . Routine general medical examination at a health care facility   . Gastroparesis   . Iron deficiency anemia   . Esophageal stricture   . Migraine headache   . Endometriosis   . Cyst, ovarian   . Peptic ulcer disease   . Non-compliance   . Allergy     allergic rhinitis    PAST SURGICAL HISTORY: Past Surgical History  Procedure Laterality Date  . Abdominal exploration surgery      w/bso   . Knee surgery  2005     left knee  . Total abdominal hysterectomy    . Cholecystectomy    . Cardiac catheterization  2009    mild non obstructive CAD    FAMILY  HISTORY Family History  Problem Relation Age of Onset  . Hypertension Mother   . Hypertension Father   . Diabetes Father   . Prostate cancer Father   . Kidney disease Father   . Diabetes Sister   . Diabetes Maternal Grandmother   . Colon cancer Neg Hx   . Lung cancer Maternal Grandfather     GYNECOLOGIC HISTORY:  No LMP recorded. Patient has had a hysterectomy.   SOCIAL HISTORY: History   Social History  . Marital Status: Married    Spouse Name: N/A    Number of Children: 2  . Years of Education: N/A   Occupational History  . DISABILITY    Social History Main Topics  . Smoking status: Former Smoker -- 0.50 packs/day for 18 years    Types: Cigarettes    Start date: 07/29/1977    Quit date: 06/25/1994  . Smokeless tobacco: Never Used     Comment: quit 19 years ago  . Alcohol Use: Yes     Comment: social drinker  . Drug Use: No  . Sexual Activity: Not on file   Other Topics Concern  . Not on file   Social History Narrative   Married    Has 2 grown children.  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   Former Smoker Quit tobacco in 1996.  She was a pack a day smoker for approximately 10 years.   Alcohol use-yes: Social  Daily Caffeine Use:6 pack of pepsi daily     Illicit Drug Use - no    Patient does not get regular exercise.       Smoking Status:  quit    ADVANCED DIRECTIVES:  <no information>  HEALTH MAINTENANCE: History  Substance Use Topics  . Smoking status: Former Smoker -- 0.50 packs/day for 18 years    Types: Cigarettes    Start date: 07/29/1977    Quit date: 06/25/1994  . Smokeless tobacco: Never Used     Comment: quit 19 years ago  . Alcohol Use: Yes     Comment: social drinker   Colonoscopy: PAP: Bone density: Lipid panel:  Allergies  Allergen Reactions  . Ace Inhibitors     REACTION: chronic cough  . Celebrex [Celecoxib] Other (See Comments)    "makes me bleed"  . Diovan [Valsartan]     angioedema    Current Outpatient  Prescriptions  Medication Sig Dispense Refill  . amLODipine (NORVASC) 5 MG tablet Take 1 tablet (5 mg total) by mouth daily.  30 tablet  2  . aspirin EC 81 MG tablet Take 81 mg by mouth daily.      . cyclobenzaprine (FLEXERIL) 5 MG tablet TAKE ONE TABLET BY MOUTH THREE TIMES DAILY AS NEEDED  90 tablet  2  . hydrochlorothiazide (HYDRODIURIL) 25 MG tablet Take 1 tablet (25 mg total) by mouth daily.  30 tablet  2  . polyethylene glycol (MIRALAX / GLYCOLAX) packet Take 17 g by mouth as needed.      . potassium chloride SA (K-DUR,KLOR-CON) 20 MEQ tablet Take 1 tablet (20 mEq total) by mouth daily.  30 tablet  2  . promethazine (PHENADOZ) 25 MG suppository Place 1 suppository (25 mg total) rectally as needed.  12 each  5  . rosuvastatin (CRESTOR) 20 MG tablet Take 1 tablet (20 mg total) by mouth daily.  30 tablet  1  . traMADol (ULTRAM) 50 MG tablet TAKE ONE TABLET BY MOUTH EVERY 8 HOURS AS NEEDED  90 tablet  0   No current facility-administered medications for this visit.    OBJECTIVE: Filed Vitals:   04/08/14 1014  BP: 145/85  Pulse: 66  Temp: 98.4 F (36.9 C)  Resp: 18    Filed Weights   04/08/14 1014  Weight: 218 lb (98.884 kg)   ECOG FS:1 - Symptomatic but completely ambulatory Ocular: Sclerae unicteric, pupils equal, round and reactive to light Ear-nose-throat: Oropharynx clear, dentition fair Lymphatic: No cervical or supraclavicular adenopathy Lungs no rales or rhonchi, good excursion bilaterally Heart regular rate and rhythm, no murmur appreciated Abd soft, nontender, positive bowel sounds MSK no focal spinal tenderness, no joint edema Neuro: non-focal, well-oriented, appropriate affect Breasts: Deferred  LAB RESULTS: CMP     Component Value Date/Time   NA 138 03/26/2014 0917   K 3.3* 03/26/2014 0917   CL 101 03/26/2014 0917   CO2 30 03/26/2014 0917   GLUCOSE 112* 03/26/2014 0917   GLUCOSE 98 04/30/2006 1506   BUN 15 03/26/2014 0917   CREATININE 0.8 03/26/2014 0917    CREATININE 0.74 10/02/2013 0821   CALCIUM 9.4 03/26/2014 0917   PROT 7.8 03/26/2014 0917   ALBUMIN 3.8 03/26/2014 0917   AST 22 03/26/2014 0917   ALT 27 03/26/2014 0917   ALKPHOS 149* 03/26/2014 0917   BILITOT 0.6 03/26/2014 0917   GFRNONAA >89 10/02/2013 0821   GFRNONAA 82* 09/18/2012 1133   GFRAA >89 10/02/2013 0821   GFRAA >90 09/18/2012 1133  No results found for this basename: SPEP, UPEP,  kappa and lambda light chains   Lab Results  Component Value Date   WBC 6.3 04/08/2014   NEUTROABS 3.5 04/08/2014   HGB 11.6 04/08/2014   HCT 36.5 04/08/2014   MCV 73* 04/08/2014   PLT 226 04/08/2014   No results found for this basename: LABCA2   No components found with this basename: SKAJG811   No results found for this basename: INR,  in the last 168 hours  STUDIES: No results found.  ASSESSMENT/PLAN: [name] is 53 year old African American female with recurrent iron deficiency anemia. She is starting to feel tired. She last had iron in 2014.  Her Hgb today is 11.6 MVC is 73.  We will see what her iron studies show. She may need iron.  We will se her back in 4 months for labs and follow-up.  She knows to call here with any questions or concerns and to go to the ED in the event of an emergency. We can certainly see her sooner if need be.   Eliezer Bottom, NP 04/08/2014 12:10 PM

## 2014-04-09 ENCOUNTER — Ambulatory Visit: Payer: Medicare PPO

## 2014-04-20 ENCOUNTER — Ambulatory Visit: Payer: Medicare PPO | Admitting: Gastroenterology

## 2014-05-03 ENCOUNTER — Other Ambulatory Visit: Payer: Medicare PPO

## 2014-05-29 ENCOUNTER — Other Ambulatory Visit: Payer: Self-pay | Admitting: Family

## 2014-06-04 ENCOUNTER — Telehealth: Payer: Self-pay | Admitting: *Deleted

## 2014-06-04 MED ORDER — CYCLOBENZAPRINE HCL 5 MG PO TABS: ORAL_TABLET | ORAL | Status: DC

## 2014-06-04 NOTE — Telephone Encounter (Signed)
Pt request refill of flexeril. Pt reports lower back spasms x 2 days. Please advise.

## 2014-06-04 NOTE — Telephone Encounter (Signed)
Attempted to notify pt and received message that mailbox is full.

## 2014-06-04 NOTE — Telephone Encounter (Signed)
rx sent

## 2014-06-28 ENCOUNTER — Ambulatory Visit: Payer: Medicare PPO | Admitting: Family

## 2014-06-28 ENCOUNTER — Telehealth: Payer: Self-pay | Admitting: *Deleted

## 2014-06-28 ENCOUNTER — Encounter: Payer: Self-pay | Admitting: Family

## 2014-06-28 ENCOUNTER — Ambulatory Visit (INDEPENDENT_AMBULATORY_CARE_PROVIDER_SITE_OTHER): Payer: Medicare PPO | Admitting: Family

## 2014-06-28 ENCOUNTER — Other Ambulatory Visit: Payer: Self-pay | Admitting: Family

## 2014-06-28 VITALS — BP 118/82 | HR 76 | Temp 98.3°F | Resp 16 | Ht 64.0 in | Wt 219.8 lb

## 2014-06-28 DIAGNOSIS — I10 Essential (primary) hypertension: Secondary | ICD-10-CM

## 2014-06-28 DIAGNOSIS — M545 Low back pain, unspecified: Secondary | ICD-10-CM

## 2014-06-28 DIAGNOSIS — E785 Hyperlipidemia, unspecified: Secondary | ICD-10-CM

## 2014-06-28 MED ORDER — TRAMADOL HCL 50 MG PO TABS: ORAL_TABLET | ORAL | Status: DC

## 2014-06-28 NOTE — Progress Notes (Signed)
Subjective:    Patient ID: [name], female    DOB: 06/18/1961, 54 y.o.   MRN: [id number]  HPI [name] is a 54 yr old female who presents today for follow up.  1) HTN- Patient is currently maintained on the following medications for blood pressure: hctz. She stopped amlodipine. Patient reports good compliance with blood pressure medications. Patient denies chest pain, shortness of breath or swelling. Last 3 blood pressure readings in our office are as follows: BP Readings from Last 3 Encounters:  06/28/14 118/82  04/08/14 145/85  03/17/14 146/90   2) Chronic pain- using tramadol.  Reports that she has not needed this in "a while." Reports that her back pain has been bad lately.    3) Hyperlipidemia- not currently taking crestor.    Review of Systems See HPI  Past Medical History  Diagnosis Date  . Atypical chest pain   . Hyperlipidemia   . Hypertension   . GERD (gastroesophageal reflux disease)   . Diabetes mellitus, type II   . Constipation   . Routine general medical examination at a health care facility   . Gastroparesis   . Iron deficiency anemia   . Esophageal stricture   . Migraine headache   . Endometriosis   . Cyst, ovarian   . Peptic ulcer disease   . Non-compliance   . Allergy     allergic rhinitis    History   Social History  . Marital Status: Married    Spouse Name: N/A    Number of Children: 2  . Years of Education: N/A   Occupational History  . DISABILITY    Social History Main Topics  . Smoking status: Former Smoker -- 0.50 packs/day for 18 years    Types: Cigarettes    Start date: 07/29/1977    Quit date: 06/25/1994  . Smokeless tobacco: Never Used     Comment: quit 19 years ago  . Alcohol Use: Yes     Comment: social drinker  . Drug Use: No  . Sexual Activity: Not on file   Other Topics Concern  . Not on file   Social History Narrative   Married    Has 2 grown children.  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   Former Smoker Quit tobacco in 1996.  She was a pack a day smoker for approximately 10 years.   Alcohol use-yes: Social    Daily Caffeine Use:6 pack of pepsi daily     Illicit Drug Use - no    Patient does not get regular exercise.       Smoking Status:  quit    Past Surgical History  Procedure Laterality Date  . Abdominal exploration surgery      w/bso   . Knee surgery  2005     left knee  . Total abdominal hysterectomy    . Cholecystectomy    . Cardiac catheterization  2009    mild non obstructive CAD    Family History  Problem Relation Age of Onset  . Hypertension Mother   . Hypertension Father   . Diabetes Father   . Prostate cancer Father   . Kidney disease Father   . Diabetes Sister   . Diabetes Maternal Grandmother   . Colon cancer Neg Hx   . Lung cancer Maternal Grandfather     Allergies  Allergen Reactions  . Ace Inhibitors     REACTION: chronic cough  . Celebrex [Celecoxib] Other (See Comments)    "  makes me bleed"  . Diovan [Valsartan]     angioedema    Current Outpatient Prescriptions on File Prior to Visit  Medication Sig Dispense Refill  . cyclobenzaprine (FLEXERIL) 5 MG tablet TAKE ONE TABLET BY MOUTH THREE TIMES DAILY AS NEEDED 30 tablet 0  . hydrochlorothiazide (HYDRODIURIL) 25 MG tablet TAKE ONE TABLET BY MOUTH ONCE DAILY 30 tablet 1  . traMADol (ULTRAM) 50 MG tablet TAKE ONE TABLET BY MOUTH EVERY 8 HOURS AS NEEDED 90 tablet 0  . amLODipine (NORVASC) 5 MG tablet Take 1 tablet (5 mg total) by mouth daily. (Patient not taking: Reported on 06/28/2014) 30 tablet 2  . aspirin EC 81 MG tablet Take 81 mg by mouth daily.    . polyethylene glycol (MIRALAX / GLYCOLAX) packet Take 17 g by mouth as needed.    . potassium chloride SA (K-DUR,KLOR-CON) 20 MEQ tablet Take 1 tablet (20 mEq total) by mouth daily. (Patient not taking: Reported on 06/28/2014) 30 tablet 2  . promethazine (PHENADOZ) 25 MG suppository Place 1 suppository (25 mg total) rectally as needed.  (Patient not taking: Reported on 06/28/2014) 12 each 5  . rosuvastatin (CRESTOR) 20 MG tablet Take 1 tablet (20 mg total) by mouth daily. (Patient not taking: Reported on 06/28/2014) 30 tablet 1   No current facility-administered medications on file prior to visit.    BP 118/82 mmHg  Pulse 76  Temp(Src) 98.3 F (36.8 C) (Oral)  Resp 16  Ht 5\' 4"  (1.626 m)  Wt 219 lb 12.8 oz (99.701 kg)  BMI 37.71 kg/m2  SpO2 99%       Objective:   Physical Exam  Constitutional: She is oriented to person, place, and time. She appears well-developed and well-nourished. No distress.  Cardiovascular: Normal rate and regular rhythm.   No murmur heard. Pulmonary/Chest: Effort normal and breath sounds normal. No respiratory distress. She has no wheezes. She has no rales. She exhibits no tenderness.  Neurological: She is alert and oriented to person, place, and time.  Psychiatric: She has a normal mood and affect. Her behavior is normal. Judgment and thought content normal.          Assessment & Plan:  s

## 2014-06-28 NOTE — Patient Instructions (Signed)
Please complete lab work prior to leaving. Follow up in 3 months.  

## 2014-06-28 NOTE — Telephone Encounter (Signed)
Pt did not show for appointment 06/28/2014 at 8:30am for 3 month follow up Pt called day of appointment at 8:48am to reschedule for 06/28/14 at 6:15pm.

## 2014-06-28 NOTE — Progress Notes (Signed)
Pre visit review using our clinic review tool, if applicable. No additional management support is needed unless otherwise documented below in the visit note. 

## 2014-06-28 NOTE — Telephone Encounter (Signed)
Noted, do not charge no show fee.

## 2014-06-28 NOTE — Telephone Encounter (Signed)
See note below

## 2014-06-29 LAB — LIPID PANEL
Cholesterol: 334 mg/dL — ABNORMAL HIGH (ref 0–200)
HDL: 47.7 mg/dL (ref >39.00)
NonHDL: 286.3
Total CHOL/HDL Ratio: 7
Triglycerides: 343 mg/dL — ABNORMAL HIGH (ref 0.0–149.0)
VLDL: 68.6 mg/dL — ABNORMAL HIGH (ref 0.0–40.0)

## 2014-06-29 LAB — HEPATIC FUNCTION PANEL
ALT: 41 U/L — ABNORMAL HIGH (ref 0–35)
AST: 34 U/L (ref 0–37)
Albumin: 3.8 g/dL (ref 3.5–5.2)
Alkaline Phosphatase: 114 U/L (ref 39–117)
Bilirubin, Direct: 0.1 mg/dL (ref 0.0–0.3)
Total Bilirubin: 0.3 mg/dL (ref 0.2–1.2)
Total Protein: 7.8 g/dL (ref 6.0–8.3)

## 2014-06-29 LAB — LDL CHOLESTEROL, DIRECT: Direct LDL: 225.8 mg/dL

## 2014-06-29 LAB — BASIC METABOLIC PANEL
BUN: 18 mg/dL (ref 6–23)
CO2: 28 mEq/L (ref 19–32)
Calcium: 9.1 mg/dL (ref 8.4–10.5)
Chloride: 101 mEq/L (ref 96–112)
Creatinine, Ser: 0.9 mg/dL (ref 0.4–1.2)
GFR: 89.04 mL/min (ref >60.00)
Glucose, Bld: 110 mg/dL — ABNORMAL HIGH (ref 70–99)
Potassium: 3.5 mEq/L (ref 3.5–5.1)
Sodium: 137 mEq/L (ref 135–145)

## 2014-06-30 ENCOUNTER — Telehealth: Payer: Self-pay | Admitting: Family

## 2014-06-30 DIAGNOSIS — E785 Hyperlipidemia, unspecified: Secondary | ICD-10-CM

## 2014-06-30 NOTE — Telephone Encounter (Signed)
Cholesterol is extremely high.  I recommend that she work hard on low fat/low cholesterol diet/exercise, weight loss. Restart crestor, repeat lipid panel in 2 months.

## 2014-07-01 NOTE — Assessment & Plan Note (Signed)
Continue occasional use of prn tramadol.

## 2014-07-01 NOTE — Assessment & Plan Note (Signed)
Stable on hctz. OK to remain off of amlodipine.

## 2014-07-01 NOTE — Assessment & Plan Note (Signed)
>>  ASSESSMENT AND PLAN FOR LOW BACK PAIN WRITTEN ON 07/01/2014  8:59 PM BY O'SULLIVAN, Kaliana Albino, NP  Continue occasional use of prn tramadol .

## 2014-07-01 NOTE — Assessment & Plan Note (Signed)
Lab Results  Component Value Date   CHOL 334* 06/28/2014   HDL 47.70 06/28/2014   LDLCALC 208* 03/26/2014   LDLDIRECT 225.8 06/28/2014   TRIG 343.0* 06/28/2014   CHOLHDL 7 06/28/2014   Follow up lipids are uncontrolled.  Resume crestor.

## 2014-07-02 MED ORDER — ROSUVASTATIN CALCIUM 20 MG PO TABS: 20.0000 mg | ORAL_TABLET | Freq: Every day | ORAL | Status: DC

## 2014-07-02 NOTE — Telephone Encounter (Signed)
Notified pt and she voices understanding. Lab order entered and appt scheduled for 08/31/14 at 8:15am for repeat lipids. Crestor rx sent.

## 2014-07-27 ENCOUNTER — Encounter: Payer: Self-pay | Admitting: Family

## 2014-08-06 ENCOUNTER — Other Ambulatory Visit: Payer: Self-pay | Admitting: *Deleted

## 2014-08-06 MED ORDER — HYDROCHLOROTHIAZIDE 25 MG PO TABS: 25.0000 mg | ORAL_TABLET | Freq: Every day | ORAL | Status: DC

## 2014-08-06 NOTE — Telephone Encounter (Signed)
Rx sent to the pharmacy by e-script.//AB/CMA 

## 2014-08-11 ENCOUNTER — Other Ambulatory Visit (HOSPITAL_BASED_OUTPATIENT_CLINIC_OR_DEPARTMENT_OTHER): Payer: Medicare PPO | Admitting: Lab

## 2014-08-11 ENCOUNTER — Ambulatory Visit (HOSPITAL_BASED_OUTPATIENT_CLINIC_OR_DEPARTMENT_OTHER): Payer: Medicare PPO | Admitting: Family

## 2014-08-11 ENCOUNTER — Ambulatory Visit (HOSPITAL_BASED_OUTPATIENT_CLINIC_OR_DEPARTMENT_OTHER): Payer: Medicare PPO

## 2014-08-11 DIAGNOSIS — D509 Iron deficiency anemia, unspecified: Secondary | ICD-10-CM

## 2014-08-11 LAB — CBC WITH DIFFERENTIAL (CANCER CENTER ONLY)
BASO#: 0 10*3/uL (ref 0.0–0.2)
BASO%: 0.1 % (ref 0.0–2.0)
EOS%: 1.3 % (ref 0.0–7.0)
Eosinophils Absolute: 0.1 10*3/uL (ref 0.0–0.5)
HCT: 34.4 % — ABNORMAL LOW (ref 34.8–46.6)
HGB: 10.7 g/dL — ABNORMAL LOW (ref 11.6–15.9)
LYMPH#: 2.5 10*3/uL (ref 0.9–3.3)
LYMPH%: 34.5 % (ref 14.0–48.0)
MCH: 23.1 pg — ABNORMAL LOW (ref 26.0–34.0)
MCHC: 31.1 g/dL — ABNORMAL LOW (ref 32.0–36.0)
MCV: 74 fL — ABNORMAL LOW (ref 81–101)
MONO#: 0.4 10*3/uL (ref 0.1–0.9)
MONO%: 4.9 % (ref 0.0–13.0)
NEUT#: 4.3 10*3/uL (ref 1.5–6.5)
NEUT%: 59.2 % (ref 39.6–80.0)
Platelets: 217 10*3/uL (ref 145–400)
RBC: 4.64 10*6/uL (ref 3.70–5.32)
RDW: 14.1 % (ref 11.1–15.7)
WBC: 7.2 10*3/uL (ref 3.9–10.0)

## 2014-08-11 LAB — FERRITIN CHCC: Ferritin: 1158 ng/ml — ABNORMAL HIGH (ref 9–269)

## 2014-08-11 LAB — RETICULOCYTES (CHCC)
ABS Retic: 38.2 10*3/uL (ref 19.0–186.0)
RBC.: 4.78 MIL/uL (ref 3.87–5.11)
Retic Ct Pct: 0.8 % (ref 0.4–2.3)

## 2014-08-11 LAB — IRON AND TIBC CHCC
%SAT: 26 % (ref 21–57)
Iron: 64 ug/dL (ref 41–142)
TIBC: 248 ug/dL (ref 236–444)
UIBC: 183 ug/dL (ref 120–384)

## 2014-08-11 MED ORDER — SODIUM CHLORIDE 0.9 % IV SOLN
INTRAVENOUS | Status: DC
  Administered 2014-08-11: 10:00:00 via INTRAVENOUS

## 2014-08-11 MED ORDER — SODIUM CHLORIDE 0.9 % IV SOLN
510.0000 mg | Freq: Once | INTRAVENOUS | Status: DC
  Administered 2014-08-11: 510 mg via INTRAVENOUS
  Filled 2014-08-11: qty 17

## 2014-08-11 NOTE — Patient Instructions (Signed)

## 2014-08-11 NOTE — Progress Notes (Signed)
[facility]  [phone] [phone]  ID: [name] OB: Apr 17, 1961 MR#: [id number] FIE#:[id number] Patient Care Team: [name], NP as PCP - General (Internal Medicine) [name], MD as Consulting Physician (Internal Medicine) Amalia Greenhouse, MD as Consulting Physician (Endocrinology)  DIAGNOSIS: Recurrent iron deficiency anemia  INTERVAL HISTORY: [name] is here today for follow-up. She is feeling tired all the time.  She was up all night in the ED with her husband and he had to be admitted. She is still under a lot of stress taking care of him.  She has not had iron since 2014.  She denies fever, chills, n/v, cough, rash, headache, dizziness, SOB, chest pain, palpitations, abdominal pain, constipation, diarrhea, abdominal pain, blood in urine or stool.  No swelling, tenderness, numbness or tingling in her extremities. No new aches or pains.  Her appetite is good and she is drinking plenty of fluids. Her weight is stable.  Her last mammogram was in April and was negative.   CURRENT TREATMENT: IV iron as indicated  REVIEW OF SYSTEMS: All other 10 point review of systems is negative.   PAST MEDICAL HISTORY: Past Medical History  Diagnosis Date  . Atypical chest pain   . Hyperlipidemia   . Hypertension   . GERD (gastroesophageal reflux disease)   . Diabetes mellitus, type II   . Constipation   . Routine general medical examination at a health care facility   . Gastroparesis   . Iron deficiency anemia   . Esophageal stricture   . Migraine headache   . Endometriosis   . Cyst, ovarian   . Peptic ulcer disease   . Non-compliance   . Allergy     allergic rhinitis    PAST SURGICAL HISTORY: Past Surgical History  Procedure Laterality Date  . Abdominal exploration surgery      w/bso   . Knee surgery  2005     left knee  . Total abdominal hysterectomy    . Cholecystectomy    . Cardiac catheterization  2009    mild non  obstructive CAD    FAMILY HISTORY Family History  Problem Relation Age of Onset  . Hypertension Mother   . Hypertension Father   . Diabetes Father   . Prostate cancer Father   . Kidney disease Father   . Diabetes Sister   . Diabetes Maternal Grandmother   . Colon cancer Neg Hx   . Lung cancer Maternal Grandfather     GYNECOLOGIC HISTORY:  No LMP recorded. Patient has had a hysterectomy.   SOCIAL HISTORY: History   Social History  . Marital Status: Married    Spouse Name: N/A  . Number of Children: 2  . Years of Education: N/A   Occupational History  . DISABILITY    Social History Main Topics  . Smoking status: Former Smoker -- 0.50 packs/day for 18 years    Types: Cigarettes    Start date: 07/29/1977    Quit date: 06/25/1994  . Smokeless tobacco: Never Used     Comment: quit 19 years ago  . Alcohol Use: Yes     Comment: social drinker  . Drug Use: No  . Sexual Activity: Not on file   Other Topics Concern  . Not on file   Social History Narrative   Married    Has 2 grown children.  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   Former Smoker Quit tobacco in 1996.  She was a pack a  day smoker for approximately 10 years.   Alcohol use-yes: Social    Daily Caffeine Use:6 pack of pepsi daily     Illicit Drug Use - no    Patient does not get regular exercise.       Smoking Status:  quit    ADVANCED DIRECTIVES:  <no information>  HEALTH MAINTENANCE: History  Substance Use Topics  . Smoking status: Former Smoker -- 0.50 packs/day for 18 years    Types: Cigarettes    Start date: 07/29/1977    Quit date: 06/25/1994  . Smokeless tobacco: Never Used     Comment: quit 19 years ago  . Alcohol Use: Yes     Comment: social drinker   Colonoscopy: PAP: Bone density: Lipid panel:  Allergies  Allergen Reactions  . Ace Inhibitors     REACTION: chronic cough  . Celebrex [Celecoxib] Other (See Comments)    "makes me bleed"  . Diovan [Valsartan]     angioedema     Current Outpatient Prescriptions  Medication Sig Dispense Refill  . amLODipine (NORVASC) 5 MG tablet Take 1 tablet (5 mg total) by mouth daily. (Patient not taking: Reported on 06/28/2014) 30 tablet 2  . aspirin EC 81 MG tablet Take 81 mg by mouth daily.    . cyclobenzaprine (FLEXERIL) 5 MG tablet TAKE ONE TABLET BY MOUTH THREE TIMES DAILY AS NEEDED 30 tablet 0  . hydrochlorothiazide (HYDRODIURIL) 25 MG tablet Take 1 tablet (25 mg total) by mouth daily. 30 tablet 5  . polyethylene glycol (MIRALAX / GLYCOLAX) packet Take 17 g by mouth as needed.    . potassium chloride SA (K-DUR,KLOR-CON) 20 MEQ tablet Take 1 tablet (20 mEq total) by mouth daily. (Patient not taking: Reported on 06/28/2014) 30 tablet 2  . promethazine (PHENADOZ) 25 MG suppository Place 1 suppository (25 mg total) rectally as needed. (Patient not taking: Reported on 06/28/2014) 12 each 5  . rosuvastatin (CRESTOR) 20 MG tablet Take 1 tablet (20 mg total) by mouth daily. 30 tablet 2  . traMADol (ULTRAM) 50 MG tablet TAKE ONE TABLET BY MOUTH EVERY 8 HOURS AS NEEDED 90 tablet 0   Current Facility-Administered Medications  Medication Dose Route Frequency Provider Last Rate Last Dose  . ferumoxytol (FERAHEME) 510 mg in sodium chloride 0.9 % 100 mL IVPB  510 mg Intravenous Once Eliezer Bottom, NP        OBJECTIVE: Filed Vitals:   08/11/14 0913  BP: 146/83  Pulse: 70  Temp: 97.5 F (36.4 C)  Resp: 18    Filed Weights   08/11/14 0913  Weight: 220 lb (99.791 kg)   ECOG FS:1 - Symptomatic but completely ambulatory Ocular: Sclerae unicteric, pupils equal, round and reactive to light Ear-nose-throat: Oropharynx clear, dentition fair Lymphatic: No cervical or supraclavicular adenopathy Lungs no rales or rhonchi, good excursion bilaterally Heart regular rate and rhythm, no murmur appreciated Abd soft, nontender, positive bowel sounds MSK no focal spinal tenderness, no joint edema Neuro: non-focal, well-oriented, appropriate  affect Breasts: Deferred  LAB RESULTS: CMP     Component Value Date/Time   NA 137 06/28/2014 1847   K 3.5 06/28/2014 1847   CL 101 06/28/2014 1847   CO2 28 06/28/2014 1847   GLUCOSE 110* 06/28/2014 1847   GLUCOSE 98 04/30/2006 1506   BUN 18 06/28/2014 1847   CREATININE 0.9 06/28/2014 1847   CREATININE 0.74 10/02/2013 0821   CALCIUM 9.1 06/28/2014 1847   PROT 7.8 06/28/2014 1847   ALBUMIN 3.8 06/28/2014 1847  AST 34 06/28/2014 1847   ALT 41* 06/28/2014 1847   ALKPHOS 114 06/28/2014 1847   BILITOT 0.3 06/28/2014 1847   GFRNONAA >89 10/02/2013 0821   GFRNONAA 82* 09/18/2012 1133   GFRAA >89 10/02/2013 0821   GFRAA >90 09/18/2012 1133   No results found for: SPEP Lab Results  Component Value Date   WBC 7.2 08/11/2014   NEUTROABS 4.3 08/11/2014   HGB 10.7* 08/11/2014   HCT 34.4* 08/11/2014   MCV 74* 08/11/2014   PLT 217 08/11/2014   No results found for: LABCA2 No components found for: OMBTD974 No results for input(s): INR in the last 168 hours.  STUDIES: No results found.  ASSESSMENT/PLAN: [name] is 54 year old African American female with recurrent iron deficiency anemia. She is starting to feel tired. She last had iron in 2014.  Her Hgb today is 10.7 MVC is 74.  We will go ahead and give her a dose of Fereheme today.  We will see her back in 3 months for labs and follow-up.  She knows to call here with any questions or concerns and to go to the ED in the event of an emergency. We can certainly see her sooner if need be.   Eliezer Bottom, NP 08/11/2014 9:33 AM

## 2014-08-12 ENCOUNTER — Other Ambulatory Visit: Payer: Medicare PPO | Admitting: Lab

## 2014-08-12 ENCOUNTER — Ambulatory Visit: Payer: Medicare PPO | Admitting: Hematology & Oncology

## 2014-08-17 ENCOUNTER — Encounter (HOSPITAL_COMMUNITY): Payer: Self-pay | Admitting: Emergency Medicine

## 2014-08-17 ENCOUNTER — Emergency Department (HOSPITAL_COMMUNITY)
Admission: EM | Admit: 2014-08-17 | Discharge: 2014-08-17 | Disposition: A | Discharge status: Home / Self Care | Payer: Medicare PPO | Attending: Emergency Medicine | Admitting: Emergency Medicine

## 2014-08-17 ENCOUNTER — Emergency Department (HOSPITAL_COMMUNITY): Payer: Medicare PPO

## 2014-08-17 DIAGNOSIS — K5900 Constipation, unspecified: Secondary | ICD-10-CM | POA: Insufficient documentation

## 2014-08-17 DIAGNOSIS — Z9119 Patient's noncompliance with other medical treatment and regimen: Secondary | ICD-10-CM | POA: Diagnosis not present

## 2014-08-17 DIAGNOSIS — E785 Hyperlipidemia, unspecified: Secondary | ICD-10-CM | POA: Diagnosis not present

## 2014-08-17 DIAGNOSIS — Z8742 Personal history of other diseases of the female genital tract: Secondary | ICD-10-CM | POA: Diagnosis not present

## 2014-08-17 DIAGNOSIS — Z862 Personal history of diseases of the blood and blood-forming organs and certain disorders involving the immune mechanism: Secondary | ICD-10-CM | POA: Diagnosis not present

## 2014-08-17 DIAGNOSIS — Z9889 Other specified postprocedural states: Secondary | ICD-10-CM | POA: Insufficient documentation

## 2014-08-17 DIAGNOSIS — R197 Diarrhea, unspecified: Secondary | ICD-10-CM | POA: Diagnosis not present

## 2014-08-17 DIAGNOSIS — R112 Nausea with vomiting, unspecified: Secondary | ICD-10-CM | POA: Insufficient documentation

## 2014-08-17 DIAGNOSIS — I10 Essential (primary) hypertension: Secondary | ICD-10-CM | POA: Diagnosis not present

## 2014-08-17 DIAGNOSIS — Z9049 Acquired absence of other specified parts of digestive tract: Secondary | ICD-10-CM | POA: Insufficient documentation

## 2014-08-17 DIAGNOSIS — Z8711 Personal history of peptic ulcer disease: Secondary | ICD-10-CM | POA: Insufficient documentation

## 2014-08-17 DIAGNOSIS — G43909 Migraine, unspecified, not intractable, without status migrainosus: Secondary | ICD-10-CM | POA: Insufficient documentation

## 2014-08-17 DIAGNOSIS — Z7982 Long term (current) use of aspirin: Secondary | ICD-10-CM | POA: Diagnosis not present

## 2014-08-17 DIAGNOSIS — R079 Chest pain, unspecified: Secondary | ICD-10-CM | POA: Diagnosis present

## 2014-08-17 DIAGNOSIS — Z87891 Personal history of nicotine dependence: Secondary | ICD-10-CM | POA: Insufficient documentation

## 2014-08-17 DIAGNOSIS — E119 Type 2 diabetes mellitus without complications: Secondary | ICD-10-CM | POA: Insufficient documentation

## 2014-08-17 LAB — CBC WITH DIFFERENTIAL/PLATELET
Basophils Absolute: 0 10*3/uL (ref 0.0–0.1)
Basophils Relative: 0 % (ref 0–1)
Eosinophils Absolute: 0 10*3/uL (ref 0.0–0.7)
Eosinophils Relative: 0 % (ref 0–5)
HCT: 40.4 % (ref 36.0–46.0)
Hemoglobin: 13 g/dL (ref 12.0–15.0)
Lymphocytes Relative: 11 % — ABNORMAL LOW (ref 12–46)
Lymphs Abs: 1.5 10*3/uL (ref 0.7–4.0)
MCH: 23.4 pg — ABNORMAL LOW (ref 26.0–34.0)
MCHC: 32.2 g/dL (ref 30.0–36.0)
MCV: 72.8 fL — ABNORMAL LOW (ref 78.0–100.0)
Monocytes Absolute: 0.7 10*3/uL (ref 0.1–1.0)
Monocytes Relative: 5 % (ref 3–12)
Neutro Abs: 11.7 10*3/uL — ABNORMAL HIGH (ref 1.7–7.7)
Neutrophils Relative %: 85 % — ABNORMAL HIGH (ref 43–77)
Platelets: 250 10*3/uL (ref 150–400)
RBC: 5.55 MIL/uL — ABNORMAL HIGH (ref 3.87–5.11)
RDW: 14.2 % (ref 11.5–15.5)
WBC: 13.8 10*3/uL — ABNORMAL HIGH (ref 4.0–10.5)

## 2014-08-17 LAB — I-STAT CHEM 8, ED
BUN: 12 mg/dL (ref 6–23)
Calcium, Ion: 1.08 mmol/L — ABNORMAL LOW (ref 1.12–1.23)
Chloride: 101 mmol/L (ref 96–112)
Creatinine, Ser: 0.7 mg/dL (ref 0.50–1.10)
Glucose, Bld: 168 mg/dL — ABNORMAL HIGH (ref 70–99)
HCT: 46 % (ref 36.0–46.0)
Hemoglobin: 15.6 g/dL — ABNORMAL HIGH (ref 12.0–15.0)
Potassium: 3.5 mmol/L (ref 3.5–5.1)
Sodium: 139 mmol/L (ref 135–145)
TCO2: 22 mmol/L (ref 0–100)

## 2014-08-17 LAB — URINALYSIS, ROUTINE W REFLEX MICROSCOPIC
Bilirubin Urine: NEGATIVE
Glucose, UA: NEGATIVE mg/dL
Hgb urine dipstick: NEGATIVE
Ketones, ur: 40 mg/dL — AB
Leukocytes, UA: NEGATIVE
Nitrite: NEGATIVE
Protein, ur: NEGATIVE mg/dL
Specific Gravity, Urine: 1.022 (ref 1.005–1.030)
Urobilinogen, UA: 0.2 mg/dL (ref 0.0–1.0)
pH: 8 (ref 5.0–8.0)

## 2014-08-17 LAB — I-STAT TROPONIN, ED: Troponin i, poc: 0 ng/mL (ref 0.00–0.08)

## 2014-08-17 MED ORDER — FAMOTIDINE IN NACL 20-0.9 MG/50ML-% IV SOLN
20.0000 mg | Freq: Once | INTRAVENOUS | Status: AC
  Administered 2014-08-17: 20 mg via INTRAVENOUS
  Filled 2014-08-17: qty 50

## 2014-08-17 MED ORDER — PROMETHAZINE HCL 25 MG PO TABS: 25.0000 mg | ORAL_TABLET | Freq: Four times a day (QID) | ORAL | Status: DC | PRN

## 2014-08-17 MED ORDER — SODIUM CHLORIDE 0.9 % IV BOLUS (SEPSIS)
1000.0000 mL | Freq: Once | INTRAVENOUS | Status: AC
  Administered 2014-08-17: 1000 mL via INTRAVENOUS

## 2014-08-17 MED ORDER — FENTANYL CITRATE 0.05 MG/ML IJ SOLN
50.0000 ug | Freq: Once | INTRAMUSCULAR | Status: AC
  Administered 2014-08-17: 50 ug via INTRAVENOUS
  Filled 2014-08-17: qty 2

## 2014-08-17 MED ORDER — FAMOTIDINE 20 MG PO TABS: 20.0000 mg | ORAL_TABLET | Freq: Two times a day (BID) | ORAL | Status: DC

## 2014-08-17 MED ORDER — PROMETHAZINE HCL 25 MG/ML IJ SOLN
25.0000 mg | Freq: Once | INTRAMUSCULAR | Status: AC
  Administered 2014-08-17: 25 mg via INTRAVENOUS
  Filled 2014-08-17: qty 1

## 2014-08-17 MED ORDER — SUCRALFATE 1 GM/10ML PO SUSP
1.0000 g | Freq: Three times a day (TID) | ORAL | Status: DC
  Administered 2014-08-17 (×2): 1 g via ORAL
  Filled 2014-08-17 (×7): qty 10

## 2014-08-17 MED ORDER — PROMETHAZINE HCL 12.5 MG RE SUPP: 12.5000 mg | Freq: Four times a day (QID) | RECTAL | Status: DC | PRN

## 2014-08-17 MED ORDER — ONDANSETRON HCL 4 MG PO TABS: 4.0000 mg | ORAL_TABLET | Freq: Four times a day (QID) | ORAL | Status: DC

## 2014-08-17 MED ORDER — PANTOPRAZOLE SODIUM 40 MG IV SOLR
40.0000 mg | Freq: Once | INTRAVENOUS | Status: AC
  Administered 2014-08-17: 40 mg via INTRAVENOUS
  Filled 2014-08-17: qty 40

## 2014-08-17 MED ORDER — PROMETHAZINE HCL 25 MG/ML IJ SOLN
6.2500 mg | Freq: Once | INTRAMUSCULAR | Status: AC
Start: 2014-08-17 — End: 2014-08-17
  Administered 2014-08-17: 6.25 mg via INTRAVENOUS
  Filled 2014-08-17: qty 1

## 2014-08-17 MED ORDER — PROMETHAZINE HCL 25 MG/ML IJ SOLN
12.5000 mg | Freq: Once | INTRAMUSCULAR | Status: DC
  Filled 2014-08-17: qty 1

## 2014-08-17 MED ORDER — KETOROLAC TROMETHAMINE 30 MG/ML IJ SOLN
30.0000 mg | Freq: Once | INTRAMUSCULAR | Status: AC
  Administered 2014-08-17: 30 mg via INTRAVENOUS
  Filled 2014-08-17: qty 1

## 2014-08-17 MED ORDER — LORAZEPAM 2 MG/ML IJ SOLN
0.5000 mg | Freq: Once | INTRAMUSCULAR | Status: AC
  Administered 2014-08-17: 0.5 mg via INTRAVENOUS
  Filled 2014-08-17: qty 1

## 2014-08-17 NOTE — ED Provider Notes (Signed)
CSN: [id number]     Arrival date & time 08/17/14  0038 History  This chart was scribe for No att. providers found by Judithann Sauger, ED Scribe. The patient was seen in room B18C/B18C and the patient's care was started at 12:51 AM.    Chief Complaint  Patient presents with  . Chest Pain   Patient is a 54 y.o. female presenting with vomiting. The history is provided by the patient. No language interpreter was used.  Emesis Severity:  Moderate Timing:  Intermittent Quality:  Stomach contents Progression:  Unchanged Chronicity:  Recurrent Recent urination:  Normal Relieved by:  Nothing Worsened by:  Nothing tried Ineffective treatments:  None tried Associated symptoms: diarrhea   Risk factors: no alcohol use    HPI Comments: [name] is a 54 y.o. female with a hx of GERD, gastroparesis, and esophageal stricture who presents to the Emergency Department complaining of N/V/D onset yesterday. She reports that the vomiting started first and the diarrhea followed around 3 pm. She reports associated CP after vomiting around noon. She denies SOB and diaphoresis. She denies any sick contacts. She was given Zofran by EMT.    Past Medical History  Diagnosis Date  . Atypical chest pain   . Hyperlipidemia   . Hypertension   . GERD (gastroesophageal reflux disease)   . Diabetes mellitus, type II   . Constipation   . Routine general medical examination at a health care facility   . Gastroparesis   . Iron deficiency anemia   . Esophageal stricture   . Migraine headache   . Endometriosis   . Cyst, ovarian   . Peptic ulcer disease   . Non-compliance   . Allergy     allergic rhinitis   Past Surgical History  Procedure Laterality Date  . Abdominal exploration surgery      w/bso   . Knee surgery  2005     left knee  . Total abdominal hysterectomy    . Cholecystectomy    . Cardiac catheterization  2009    mild non obstructive CAD   Family History  Problem Relation Age of Onset   . Hypertension Mother   . Hypertension Father   . Diabetes Father   . Prostate cancer Father   . Kidney disease Father   . Diabetes Sister   . Diabetes Maternal Grandmother   . Colon cancer Neg Hx   . Lung cancer Maternal Grandfather    History  Substance Use Topics  . Smoking status: Former Smoker -- 0.50 packs/day for 18 years    Types: Cigarettes    Start date: 07/29/1977    Quit date: 06/25/1994  . Smokeless tobacco: Never Used     Comment: quit 19 years ago  . Alcohol Use: Yes     Comment: social drinker   OB History    No data available     Review of Systems  Constitutional: Negative for diaphoresis and fatigue.  Respiratory: Negative for shortness of breath.   Cardiovascular: Positive for chest pain. Negative for palpitations and leg swelling.  Gastrointestinal: Positive for nausea, vomiting and diarrhea.  All other systems reviewed and are negative.     Allergies  Ace inhibitors; Celebrex; and Diovan  Home Medications   Prior to Admission medications   Medication Sig Start Date End Date Taking? Authorizing Provider  amLODipine (NORVASC) 5 MG tablet Take 1 tablet (5 mg total) by mouth daily. Patient not taking: Reported on 06/28/2014 01/15/14   [name],  NP  aspirin EC 81 MG tablet Take 81 mg by mouth daily.    Historical Provider, MD  cyclobenzaprine (FLEXERIL) 5 MG tablet TAKE ONE TABLET BY MOUTH THREE TIMES DAILY AS NEEDED 06/04/14   [name], NP  hydrochlorothiazide (HYDRODIURIL) 25 MG tablet Take 1 tablet (25 mg total) by mouth daily. 08/06/14   [name], NP  polyethylene glycol (MIRALAX / GLYCOLAX) packet Take 17 g by mouth as needed.    Historical Provider, MD  potassium chloride SA (K-DUR,KLOR-CON) 20 MEQ tablet Take 1 tablet (20 mEq total) by mouth daily. Patient not taking: Reported on 06/28/2014 03/18/14   [name], NP  promethazine (PHENADOZ) 25 MG suppository Place 1 suppository (25 mg total) rectally as  needed. Patient not taking: Reported on 06/28/2014 12/15/13   [name], NP  rosuvastatin (CRESTOR) 20 MG tablet Take 1 tablet (20 mg total) by mouth daily. 07/02/14   [name], NP  traMADol (ULTRAM) 50 MG tablet TAKE ONE TABLET BY MOUTH EVERY 8 HOURS AS NEEDED 06/28/14   [name], NP   BP 151/79 mmHg  Pulse 80  Temp(Src) 98.5 F (36.9 C) (Oral)  Resp 21  Ht 5\' 4"  (1.626 m)  Wt 220 lb (99.791 kg)  BMI 37.74 kg/m2  SpO2 100% Physical Exam  Constitutional: She is oriented to person, place, and time. She appears well-developed and well-nourished. No distress.  HENT:  Head: Normocephalic and atraumatic.  Mouth/Throat: Oropharynx is clear and moist. No oropharyngeal exudate.  Eyes: Conjunctivae and EOM are normal. Pupils are equal, round, and reactive to light.  Neck: Normal range of motion. Neck supple. No tracheal deviation present.  Cardiovascular: Normal rate, regular rhythm and normal heart sounds.   Pulmonary/Chest: Effort normal and breath sounds normal. No respiratory distress. She has no wheezes. She has no rales. She exhibits no tenderness.  Abdominal: Soft. Bowel sounds are normal. There is no tenderness. There is no rebound and no guarding.  Hyperactive bowel sounds   Musculoskeletal: Normal range of motion.  Neurological: She is alert and oriented to person, place, and time. She has normal reflexes.  Skin: Skin is warm and dry.  Psychiatric: She has a normal mood and affect. Her behavior is normal.  Nursing note and vitals reviewed.   ED Course  Procedures (including critical care time) DIAGNOSTIC STUDIES: Oxygen Saturation is 100% on RA, normal by my interpretation.    COORDINATION OF CARE: 12:54 AM- Pt advised of plan for treatment and pt agrees.    Labs Review Labs Reviewed  CBC WITH DIFFERENTIAL/PLATELET  I-STAT TROPOININ, ED  I-STAT TROPOININ, ED  I-STAT CHEM 8, ED    Imaging Review No results found.   EKG Interpretation None       EKG Interpretation  Date/Time:  Tuesday August 17 2014 00:49:13 EST Ventricular Rate:  80 PR Interval:  158 QRS Duration: 106 QT Interval:  474 QTC Calculation: 547 R Axis:   18 Text Interpretation:  Sinus rhythm Prolonged QT interval Confirmed by [facility]  MD, Reesha Debes ([id number]) on 08/17/2014 12:55:44 AM       MDM   Final diagnoses:  None   Symptoms consistent with n/v/d.  Pain consistent with known gerd and spasm.  Ruled out by EKG and enzymes.  Po challenged successfully follow up with your PMD.  Strict return precautions given   I personally performed the services described in this documentation, which was scribed in my presence. The recorded information has been reviewed and is accurate.  Carlisle Beers, MD 08/17/14 [phone]

## 2014-08-17 NOTE — ED Notes (Signed)
Pt c/o chest pressure that started at 1pm yesterday. Pt also c/o nausea/vomiting that started at the same time. Pt denies any other sx and states she has never had this pain before. Pt was given zofran IV by EMS.

## 2014-08-17 NOTE — ED Notes (Signed)
Per EMS: pt coming from home with c/o chest pressure and n/v since noon. Pt was given 4 mg Zofran, EKG unremarkable. 20g LAC. Denies shortness of breath diaphoresis

## 2014-08-30 ENCOUNTER — Telehealth: Payer: Self-pay | Admitting: *Deleted

## 2014-08-30 NOTE — Telephone Encounter (Signed)
I don't see that we have filled this for her in 2 years. Lets discuss at her upcoming visit.

## 2014-08-30 NOTE — Telephone Encounter (Signed)
Received fax request from [facility] for generic zegerid 40-1100mg . Medication is no longer on pt's active medication list.  Please advise refill?

## 2014-08-30 NOTE — Telephone Encounter (Signed)
Notified pt and she voices understanding. Will keep current appt on 10/06/14 and will call if symptoms worsen before that time.

## 2014-08-30 NOTE — Telephone Encounter (Signed)
Attempted to notify pt and message states mailbox is full. Will try again later.

## 2014-08-31 ENCOUNTER — Telehealth: Payer: Self-pay | Admitting: *Deleted

## 2014-08-31 ENCOUNTER — Other Ambulatory Visit (INDEPENDENT_AMBULATORY_CARE_PROVIDER_SITE_OTHER): Payer: Medicare PPO

## 2014-08-31 DIAGNOSIS — E785 Hyperlipidemia, unspecified: Secondary | ICD-10-CM

## 2014-08-31 LAB — LIPID PANEL
Cholesterol: 310 mg/dL — ABNORMAL HIGH (ref 0–200)
HDL: 46 mg/dL (ref >39.00)
LDL Cholesterol: 238 mg/dL — ABNORMAL HIGH (ref 0–99)
NonHDL: 264
Total CHOL/HDL Ratio: 7
Triglycerides: 128 mg/dL (ref 0.0–149.0)
VLDL: 25.6 mg/dL (ref 0.0–40.0)

## 2014-08-31 NOTE — Telephone Encounter (Signed)
-----   Message from [name], NP sent at 08/31/2014  3:35 PM EST ----- Cholesterol is still very high- is she taking crestor every day? If not, please advise her to resume.

## 2014-08-31 NOTE — Telephone Encounter (Signed)
Attempted to reach pt.  Voice mailbox full and unable to leave message.

## 2014-09-01 NOTE — Telephone Encounter (Signed)
Mailbox full, unable to leave message. Mailed letter to pt.

## 2014-09-27 ENCOUNTER — Ambulatory Visit: Payer: Medicare PPO | Admitting: Family

## 2014-10-06 ENCOUNTER — Ambulatory Visit (INDEPENDENT_AMBULATORY_CARE_PROVIDER_SITE_OTHER): Payer: Medicare PPO | Admitting: Family

## 2014-10-06 ENCOUNTER — Telehealth: Payer: Self-pay | Admitting: Family

## 2014-10-06 ENCOUNTER — Encounter: Payer: Self-pay | Admitting: Family

## 2014-10-06 VITALS — BP 126/80 | HR 83 | Temp 98.0°F | Resp 16 | Ht 64.0 in | Wt 219.4 lb

## 2014-10-06 DIAGNOSIS — M545 Low back pain, unspecified: Secondary | ICD-10-CM

## 2014-10-06 DIAGNOSIS — R739 Hyperglycemia, unspecified: Secondary | ICD-10-CM | POA: Diagnosis not present

## 2014-10-06 DIAGNOSIS — D509 Iron deficiency anemia, unspecified: Secondary | ICD-10-CM | POA: Diagnosis not present

## 2014-10-06 DIAGNOSIS — I10 Essential (primary) hypertension: Secondary | ICD-10-CM

## 2014-10-06 DIAGNOSIS — E876 Hypokalemia: Secondary | ICD-10-CM

## 2014-10-06 DIAGNOSIS — E785 Hyperlipidemia, unspecified: Secondary | ICD-10-CM

## 2014-10-06 LAB — BASIC METABOLIC PANEL
BUN: 17 mg/dL (ref 6–23)
CO2: 31 mEq/L (ref 19–32)
Calcium: 9.8 mg/dL (ref 8.4–10.5)
Chloride: 102 mEq/L (ref 96–112)
Creatinine, Ser: 0.86 mg/dL (ref 0.40–1.20)
GFR: 87.76 mL/min (ref >60.00)
Glucose, Bld: 88 mg/dL (ref 70–99)
Potassium: 3.3 mEq/L — ABNORMAL LOW (ref 3.5–5.1)
Sodium: 140 mEq/L (ref 135–145)

## 2014-10-06 LAB — HEMOGLOBIN A1C: Hgb A1c MFr Bld: 6.3 % (ref 4.6–6.5)

## 2014-10-06 MED ORDER — TRAMADOL HCL 50 MG PO TABS: ORAL_TABLET | ORAL | Status: DC

## 2014-10-06 MED ORDER — POTASSIUM CHLORIDE CRYS ER 20 MEQ PO TBCR: 20.0000 meq | EXTENDED_RELEASE_TABLET | Freq: Every day | ORAL | Status: DC

## 2014-10-06 NOTE — Patient Instructions (Signed)
Please complete lab work prior to leaving. Start crestor.  Schedule lab visit in 6 weeks for cholesterol (fasting) Follow up in 3 months for office visit.

## 2014-10-06 NOTE — Telephone Encounter (Signed)
Please let pt know that her sugar is about the same- still borderline DM.  Potassium is low. Start kdur one tab po daily, repeat bmet in 1 week. Dx hypokalemia.

## 2014-10-06 NOTE — Progress Notes (Signed)
Subjective:    Patient ID: [name], female    DOB: 11-30-1960, 54 y.o.   MRN: [id number]  HPI  [name] is a 54 yr old female who presents today for follow up.  Patient presents today for follow up of multiple medical problems.  Hyperglycemia  Pt is currently maintained on the following medications for hyperglycemia:  none- diet- trying to watch what to eat.   Lab Results  Component Value Date   HGBA1C 6.2 03/17/2014   HGBA1C 5.9* 10/02/2013   HGBA1C 6.1* 02/29/2012    Lab Results  Component Value Date   MICROALBUR 0.88 10/02/2013   LDLCALC 238* 08/31/2014   CREATININE 0.70 08/17/2014   Hyperlipidemia  Patient is currently maintained on the following medication for hyperlipidemia: crestor 20 Last lipid panel as follows:  Lab Results  Component Value Date   CHOL 310* 08/31/2014   HDL 46.00 08/31/2014   LDLCALC 238* 08/31/2014   LDLDIRECT 225.8 06/28/2014   TRIG 128.0 08/31/2014   CHOLHDL 7 08/31/2014   Patient denies myalgia. Patient reports good compliance with low fat/low cholesterol diet.   Hypertension  Patient is currently maintained on the following medications for blood pressure: amlodipine/hctz Patient reports good compliance with blood pressure medications. Patient denies chest pain, shortness of breath or swelling. Last 3 blood pressure readings in our office are as follows: BP Readings from Last 3 Encounters:  10/06/14 126/80  08/17/14 129/66  08/11/14 146/83   Iron deficiency anemia- she is following with hematology for IV iron infusions.  Last infusion was 08/11/14.    Uses tramadol prn back pain- has been out of tramadol for "a while."   Review of Systems    see HPI  Past Medical History  Diagnosis Date  . Atypical chest pain   . Hyperlipidemia   . Hypertension   . GERD (gastroesophageal reflux disease)   . Diabetes mellitus, type II   . Constipation   . Routine general medical examination at a health care facility   .  Gastroparesis   . Iron deficiency anemia   . Esophageal stricture   . Migraine headache   . Endometriosis   . Cyst, ovarian   . Peptic ulcer disease   . Non-compliance   . Allergy     allergic rhinitis    History   Social History  . Marital Status: Married    Spouse Name: N/A  . Number of Children: 2  . Years of Education: N/A   Occupational History  . DISABILITY    Social History Main Topics  . Smoking status: Former Smoker -- 0.50 packs/day for 18 years    Types: Cigarettes    Start date: 07/29/1977    Quit date: 06/25/1994  . Smokeless tobacco: Never Used     Comment: quit 19 years ago  . Alcohol Use: Yes     Comment: social drinker  . Drug Use: No  . Sexual Activity: Not on file   Other Topics Concern  . Not on file   Social History Narrative   Married    Has 2 grown children.  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   Former Smoker Quit tobacco in 1996.  She was a pack a day smoker for approximately 10 years.   Alcohol use-yes: Social    Daily Caffeine Use:6 pack of pepsi daily     Illicit Drug Use - no    Patient does not get regular exercise.       Smoking  Status:  quit    Past Surgical History  Procedure Laterality Date  . Abdominal exploration surgery      w/bso   . Knee surgery  2005     left knee  . Total abdominal hysterectomy    . Cholecystectomy    . Cardiac catheterization  2009    mild non obstructive CAD    Family History  Problem Relation Age of Onset  . Hypertension Mother   . Hypertension Father   . Diabetes Father   . Prostate cancer Father   . Kidney disease Father   . Diabetes Sister   . Diabetes Maternal Grandmother   . Colon cancer Neg Hx   . Lung cancer Maternal Grandfather     Allergies  Allergen Reactions  . Ace Inhibitors     REACTION: chronic cough  . Celebrex [Celecoxib] Other (See Comments)    "makes me bleed"  . Diovan [Valsartan]     angioedema    Current Outpatient Prescriptions on File Prior to  Visit  Medication Sig Dispense Refill  . amLODipine (NORVASC) 5 MG tablet Take 1 tablet (5 mg total) by mouth daily. 30 tablet 2  . hydrochlorothiazide (HYDRODIURIL) 25 MG tablet Take 1 tablet (25 mg total) by mouth daily. 30 tablet 5  . traMADol (ULTRAM) 50 MG tablet TAKE ONE TABLET BY MOUTH EVERY 8 HOURS AS NEEDED 90 tablet 0  . aspirin EC 81 MG tablet Take 81 mg by mouth daily.    . rosuvastatin (CRESTOR) 20 MG tablet Take 1 tablet (20 mg total) by mouth daily. (Patient not taking: Reported on 10/06/2014) 30 tablet 2  . [DISCONTINUED] famotidine (PEPCID) 20 MG tablet Take 1 tablet (20 mg total) by mouth 2 (two) times daily. 30 tablet 0   No current facility-administered medications on file prior to visit.    BP 126/80 mmHg  Pulse 83  Temp(Src) 98 F (36.7 C) (Oral)  Resp 16  Ht 5\' 4"  (1.626 m)  Wt 219 lb 6.4 oz (99.519 kg)  BMI 37.64 kg/m2  SpO2 99%     Objective:   Physical Exam  Constitutional: She appears well-developed and well-nourished.  Cardiovascular: Normal rate, regular rhythm and normal heart sounds.   No murmur heard. Pulmonary/Chest: Effort normal and breath sounds normal. No respiratory distress. She has no wheezes.  Musculoskeletal: She exhibits no edema.  Psychiatric: She has a normal mood and affect. Her behavior is normal. Judgment and thought content normal.          Assessment & Plan:

## 2014-10-07 DIAGNOSIS — R739 Hyperglycemia, unspecified: Secondary | ICD-10-CM | POA: Insufficient documentation

## 2014-10-07 NOTE — Assessment & Plan Note (Signed)
Continue tramadol prn 

## 2014-10-07 NOTE — Assessment & Plan Note (Signed)
Borderline dm, advised pt on diabetic diet, exercise.   Lab Results  Component Value Date   HGBA1C 6.3 10/06/2014

## 2014-10-07 NOTE — Assessment & Plan Note (Signed)
BP stable on current meds.  Continue same, obtain a1c.

## 2014-10-07 NOTE — Assessment & Plan Note (Signed)
>>  ASSESSMENT AND PLAN FOR LOW BACK PAIN WRITTEN ON 10/07/2014  9:17 AM BY O'SULLIVAN, Abdon Petrosky, NP  Continue tramadol  prn.

## 2014-10-07 NOTE — Assessment & Plan Note (Signed)
clinically stable.  Management per heme

## 2014-10-07 NOTE — Assessment & Plan Note (Signed)
Discussed increased cardiovascular risk based on her numbers. She is agreeable to statin.  Start crestor

## 2014-10-11 NOTE — Telephone Encounter (Signed)
Attempted to reach pt but mailbox is full and cannot accept messages.

## 2014-10-13 NOTE — Telephone Encounter (Signed)
Notified pt and scheduled lab appt for 10/20/14 at 9:30am. Future order entered.

## 2014-10-20 ENCOUNTER — Other Ambulatory Visit (INDEPENDENT_AMBULATORY_CARE_PROVIDER_SITE_OTHER): Payer: Medicare PPO

## 2014-10-20 DIAGNOSIS — E876 Hypokalemia: Secondary | ICD-10-CM | POA: Diagnosis not present

## 2014-10-20 LAB — BASIC METABOLIC PANEL
BUN: 16 mg/dL (ref 6–23)
CO2: 28 mEq/L (ref 19–32)
Calcium: 9.5 mg/dL (ref 8.4–10.5)
Chloride: 102 mEq/L (ref 96–112)
Creatinine, Ser: 1.38 mg/dL — ABNORMAL HIGH (ref 0.40–1.20)
GFR: 50.84 mL/min — ABNORMAL LOW (ref >60.00)
Glucose, Bld: 125 mg/dL — ABNORMAL HIGH (ref 70–99)
Potassium: 3.3 mEq/L — ABNORMAL LOW (ref 3.5–5.1)
Sodium: 138 mEq/L (ref 135–145)

## 2014-10-21 ENCOUNTER — Telehealth: Payer: Self-pay | Admitting: Family

## 2014-10-21 DIAGNOSIS — E876 Hypokalemia: Secondary | ICD-10-CM

## 2014-10-21 NOTE — Telephone Encounter (Signed)
Potassium is still low. If taking kdur every day, need to increase from 71meq to 67meq once daily.  Repeat bmet in 1 week, dx hypokalemia.

## 2014-10-22 NOTE — Telephone Encounter (Signed)
Patient notified and understands plan. Lab appt scheduled for Friday 10/28/2014 to recheck bmet.

## 2014-10-22 NOTE — Telephone Encounter (Signed)
Mailbox full, could not leave message.  Home phone-wrong number

## 2014-10-22 NOTE — Telephone Encounter (Signed)
Pt returning your call best # [phone]. Home # is no longer active, pt chart updated.

## 2014-10-29 ENCOUNTER — Other Ambulatory Visit: Payer: Medicare PPO

## 2014-11-01 ENCOUNTER — Other Ambulatory Visit (INDEPENDENT_AMBULATORY_CARE_PROVIDER_SITE_OTHER): Payer: Medicare PPO

## 2014-11-01 DIAGNOSIS — E785 Hyperlipidemia, unspecified: Secondary | ICD-10-CM

## 2014-11-01 DIAGNOSIS — E876 Hypokalemia: Secondary | ICD-10-CM | POA: Diagnosis not present

## 2014-11-01 LAB — LIPID PANEL
Cholesterol: 292 mg/dL — ABNORMAL HIGH (ref 0–200)
HDL: 44.2 mg/dL (ref >39.00)
NonHDL: 247.8
Total CHOL/HDL Ratio: 7
Triglycerides: 233 mg/dL — ABNORMAL HIGH (ref 0.0–149.0)
VLDL: 46.6 mg/dL — ABNORMAL HIGH (ref 0.0–40.0)

## 2014-11-01 LAB — BASIC METABOLIC PANEL
BUN: 21 mg/dL (ref 6–23)
CO2: 25 mEq/L (ref 19–32)
Calcium: 9.5 mg/dL (ref 8.4–10.5)
Chloride: 102 mEq/L (ref 96–112)
Creatinine, Ser: 0.78 mg/dL (ref 0.40–1.20)
GFR: 98.21 mL/min (ref >60.00)
Glucose, Bld: 107 mg/dL — ABNORMAL HIGH (ref 70–99)
Potassium: 3.7 mEq/L (ref 3.5–5.1)
Sodium: 135 mEq/L (ref 135–145)

## 2014-11-01 LAB — LDL CHOLESTEROL, DIRECT: Direct LDL: 204 mg/dL

## 2014-11-02 ENCOUNTER — Telehealth: Payer: Self-pay | Admitting: Family

## 2014-11-02 MED ORDER — ROSUVASTATIN CALCIUM 20 MG PO TABS: 20.0000 mg | ORAL_TABLET | Freq: Every day | ORAL | Status: DC

## 2014-11-02 NOTE — Telephone Encounter (Signed)
Spoke with pt. She has not been taking medication. States she doesn't want to take a lot of meds. Reminded pt of long term risks of untreated high cholesterol.  Needs new rx sent to pharmacy. States she will restart medication next week.

## 2014-11-02 NOTE — Telephone Encounter (Signed)
Cholesterol is very high still. Is she taking crestor 20mg ? If not, need to restart. If taking, need to increase to 40mg  and repeat lipids in 6 weeks, dx hyperlipidemia.

## 2014-11-02 NOTE — Telephone Encounter (Signed)
Noted  

## 2014-11-03 ENCOUNTER — Other Ambulatory Visit: Payer: Medicare PPO

## 2014-11-09 ENCOUNTER — Telehealth: Payer: Self-pay | Admitting: Hematology & Oncology

## 2014-11-09 NOTE — Telephone Encounter (Signed)
Patient called and cx 11/10/14 apt and resch for 12/22/14

## 2014-11-10 ENCOUNTER — Other Ambulatory Visit: Payer: Medicare PPO

## 2014-11-10 ENCOUNTER — Ambulatory Visit: Payer: Medicare PPO | Admitting: Hematology & Oncology

## 2014-11-16 ENCOUNTER — Ambulatory Visit (HOSPITAL_BASED_OUTPATIENT_CLINIC_OR_DEPARTMENT_OTHER)
Admission: RE | Admit: 2014-11-16 | Discharge: 2014-11-16 | Disposition: A | Discharge status: Home / Self Care | Payer: Medicare PPO | Source: Ambulatory Visit | Attending: Family | Admitting: Family

## 2014-11-16 ENCOUNTER — Other Ambulatory Visit: Payer: Medicare PPO

## 2014-11-16 ENCOUNTER — Other Ambulatory Visit: Payer: Self-pay | Admitting: Family

## 2014-11-16 DIAGNOSIS — Z1231 Encounter for screening mammogram for malignant neoplasm of breast: Secondary | ICD-10-CM

## 2014-11-17 ENCOUNTER — Other Ambulatory Visit: Payer: Medicare PPO

## 2014-12-22 ENCOUNTER — Encounter: Payer: Self-pay | Admitting: Family

## 2014-12-22 ENCOUNTER — Other Ambulatory Visit (HOSPITAL_BASED_OUTPATIENT_CLINIC_OR_DEPARTMENT_OTHER): Payer: Medicare PPO

## 2014-12-22 ENCOUNTER — Ambulatory Visit (HOSPITAL_BASED_OUTPATIENT_CLINIC_OR_DEPARTMENT_OTHER): Payer: Medicare PPO | Admitting: Family

## 2014-12-22 VITALS — BP 159/77 | HR 61 | Temp 98.1°F | Resp 18 | Ht 64.0 in | Wt 219.0 lb

## 2014-12-22 DIAGNOSIS — D509 Iron deficiency anemia, unspecified: Secondary | ICD-10-CM | POA: Diagnosis not present

## 2014-12-22 LAB — CBC WITH DIFFERENTIAL (CANCER CENTER ONLY)
BASO#: 0 10*3/uL (ref 0.0–0.2)
BASO%: 0.3 % (ref 0.0–2.0)
EOS%: 1.1 % (ref 0.0–7.0)
Eosinophils Absolute: 0.1 10*3/uL (ref 0.0–0.5)
HCT: 34.2 % — ABNORMAL LOW (ref 34.8–46.6)
HGB: 10.8 g/dL — ABNORMAL LOW (ref 11.6–15.9)
LYMPH#: 2.8 10*3/uL (ref 0.9–3.3)
LYMPH%: 39 % (ref 14.0–48.0)
MCH: 23.5 pg — ABNORMAL LOW (ref 26.0–34.0)
MCHC: 31.6 g/dL — ABNORMAL LOW (ref 32.0–36.0)
MCV: 75 fL — ABNORMAL LOW (ref 81–101)
MONO#: 0.4 10*3/uL (ref 0.1–0.9)
MONO%: 5.7 % (ref 0.0–13.0)
NEUT#: 3.9 10*3/uL (ref 1.5–6.5)
NEUT%: 53.9 % (ref 39.6–80.0)
Platelets: 214 10*3/uL (ref 145–400)
RBC: 4.59 10*6/uL (ref 3.70–5.32)
RDW: 13.5 % (ref 11.1–15.7)
WBC: 7.2 10*3/uL (ref 3.9–10.0)

## 2014-12-22 LAB — IRON AND TIBC CHCC
%SAT: 34 % (ref 21–57)
Iron: 75 ug/dL (ref 41–142)
TIBC: 220 ug/dL — ABNORMAL LOW (ref 236–444)
UIBC: 145 ug/dL (ref 120–384)

## 2014-12-22 LAB — RETICULOCYTES (CHCC)
ABS Retic: 41.8 10*3/uL (ref 19.0–186.0)
RBC.: 4.64 MIL/uL (ref 3.87–5.11)
Retic Ct Pct: 0.9 % (ref 0.4–2.3)

## 2014-12-22 LAB — FERRITIN CHCC: Ferritin: 893 ng/ml — ABNORMAL HIGH (ref 9–269)

## 2014-12-22 NOTE — Progress Notes (Signed)
Hematology and Oncology Follow Up Visit  [name] [id number] Jul 05, 1960 54 y.o. 12/22/2014   Principle Diagnosis:  Recurrent iron deficiency anemia  Current Therapy:   IV iron as indicated - last dose in February 2016    Interim History: [name] is here today for follow-up. She is feeling fatigued and chewing a lot of ice. Her CBC is stable so we will see what her iron studies show.  She is still caring for her husband at home and this is stressful for her at times.  She denies fever, chills, n/v, cough, rash, headache, dizziness, SOB, chest pain, palpitations, abdominal pain, constipation, diarrhea, abdominal pain, blood in urine or stool.  No swelling or tenderness in her extremities. No new aches or pains. She has chronic back pain and sometimes has numbness and tingling in her fingers in her right hand.  Her appetite is good and she is drinking plenty of fluids. Her weight is stable.  Her last mammogram was in May and was negative.   Medications:    Medication List       This list is accurate as of: 12/22/14 10:32 AM.  Always use your most recent med list.               amLODipine 5 MG tablet  Commonly known as:  NORVASC  Take 1 tablet (5 mg total) by mouth daily.     aspirin EC 81 MG tablet  Take 81 mg by mouth daily.     hydrochlorothiazide 25 MG tablet  Commonly known as:  HYDRODIURIL  Take 1 tablet (25 mg total) by mouth daily.     potassium chloride SA 20 MEQ tablet  Commonly known as:  K-DUR,KLOR-CON  Take 1 tablet (20 mEq total) by mouth daily.     rosuvastatin 20 MG tablet  Commonly known as:  CRESTOR  Take 1 tablet (20 mg total) by mouth daily.     traMADol 50 MG tablet  Commonly known as:  ULTRAM  TAKE ONE TABLET BY MOUTH EVERY 8 HOURS AS NEEDED        Allergies:  Allergies  Allergen Reactions  . Ace Inhibitors     REACTION: chronic cough  . Celebrex [Celecoxib] Other (See Comments)    "makes me bleed"  . Diovan [Valsartan]     angioedema    Past Medical History, Surgical history, Social history, and Family History were reviewed and updated.  Review of Systems: All other 10 point review of systems is negative.   Physical Exam:  height is 5\' 4"  (1.626 m) and weight is 219 lb (99.338 kg). Her oral temperature is 98.1 F (36.7 C). Her blood pressure is 159/77 and her pulse is 61. Her respiration is 18.   Wt Readings from Last 3 Encounters:  12/22/14 219 lb (99.338 kg)  10/06/14 219 lb 6.4 oz (99.519 kg)  08/17/14 220 lb (99.791 kg)    Ocular: Sclerae unicteric, pupils equal, round and reactive to light Ear-nose-throat: Oropharynx clear, dentition fair Lymphatic: No cervical or supraclavicular adenopathy Lungs no rales or rhonchi, good excursion bilaterally Heart regular rate and rhythm, no murmur appreciated Abd soft, nontender, positive bowel sounds MSK no focal spinal tenderness, no joint edema Neuro: non-focal, well-oriented, appropriate affect Breasts: Deferred  Lab Results  Component Value Date   WBC 7.2 12/22/2014   HGB 10.8* 12/22/2014   HCT 34.2* 12/22/2014   MCV 75* 12/22/2014   PLT 214 12/22/2014   Lab Results  Component Value Date  FERRITIN 1,158* 08/11/2014   IRON 64 08/11/2014   TIBC 248 08/11/2014   UIBC 183 08/11/2014   IRONPCTSAT 26 08/11/2014   Lab Results  Component Value Date   RETICCTPCT 0.8 08/11/2014   RBC 4.59 12/22/2014   RETICCTABS 38.2 08/11/2014   No results found for: [facility] Lab Results  Component Value Date   IGA 305 05/04/2009   Lab Results  Component Value Date   TOTALPROTELP 7.2 05/04/2009   ALBUMINELP 49.1* 05/04/2009   A1GS 8.3* 05/04/2009   A2GS 14.1* 05/04/2009   BETS 6.6 05/04/2009   GAMS 16.4 05/04/2009   MSPIKE NOT DET 05/04/2009     Chemistry      Component Value Date/Time   NA 135 11/01/2014 0854   K 3.7 11/01/2014 0854   CL 102 11/01/2014 0854   CO2 25 11/01/2014 0854   BUN 21 11/01/2014 0854    CREATININE 0.78 11/01/2014 0854   CREATININE 0.74 10/02/2013 0821      Component Value Date/Time   CALCIUM 9.5 11/01/2014 0854   ALKPHOS 114 06/28/2014 1847   AST 34 06/28/2014 1847   ALT 41* 06/28/2014 1847   BILITOT 0.3 06/28/2014 1847     Impression and Plan: [name] is a very pleasant 54 year old African American female with recurrent iron deficiency anemia. She is feeling fatigued and chewing lots of ice.  Her CBC today looks ok. We will see what her iron studies show and bring her back in later this week for iron if need be.  She will also start taking folic acid daily.  We will see her back in 3 months for labs and follow-up.  She knows to call here with any questions or concerns. We can certainly see her sooner if need be.   Eliezer Bottom, NP 6/29/201610:32 AM

## 2015-01-05 ENCOUNTER — Ambulatory Visit: Payer: Medicare PPO | Admitting: Family

## 2015-01-06 ENCOUNTER — Telehealth: Payer: Self-pay

## 2015-01-06 NOTE — Telephone Encounter (Signed)
Called to schedule Medicare Wellness Visit with Health Coach.  Left message for call back.

## 2015-01-10 ENCOUNTER — Other Ambulatory Visit: Payer: Self-pay | Admitting: *Deleted

## 2015-01-12 ENCOUNTER — Ambulatory Visit: Payer: Medicare PPO | Admitting: Family

## 2015-01-13 ENCOUNTER — Telehealth: Payer: Self-pay | Admitting: Family

## 2015-01-13 ENCOUNTER — Encounter: Payer: Self-pay | Admitting: Family

## 2015-01-13 NOTE — Telephone Encounter (Signed)
No

## 2015-01-13 NOTE — Telephone Encounter (Signed)
Patient no show 01/12/15 letter mailed - charge or no charge

## 2015-01-25 NOTE — Telephone Encounter (Signed)
Left a message for call back.  

## 2015-01-28 ENCOUNTER — Other Ambulatory Visit: Payer: Self-pay | Admitting: Family

## 2015-01-31 NOTE — Telephone Encounter (Signed)
Rx sent to pharmacy. Pt needs to schedule an appointment before further refills will be given.

## 2015-02-01 NOTE — Telephone Encounter (Signed)
Scheduled for 02/11/15 °

## 2015-02-08 NOTE — Telephone Encounter (Signed)
Appointment scheduled.

## 2015-02-11 ENCOUNTER — Encounter: Payer: Self-pay | Admitting: Family

## 2015-02-11 ENCOUNTER — Ambulatory Visit (INDEPENDENT_AMBULATORY_CARE_PROVIDER_SITE_OTHER): Payer: Medicare PPO | Admitting: Family

## 2015-02-11 VITALS — BP 130/78 | HR 61 | Temp 97.5°F | Ht 64.0 in | Wt 218.4 lb

## 2015-02-11 DIAGNOSIS — E785 Hyperlipidemia, unspecified: Secondary | ICD-10-CM

## 2015-02-11 DIAGNOSIS — I10 Essential (primary) hypertension: Secondary | ICD-10-CM

## 2015-02-11 DIAGNOSIS — R1310 Dysphagia, unspecified: Secondary | ICD-10-CM

## 2015-02-11 DIAGNOSIS — Z0000 Encounter for general adult medical examination without abnormal findings: Secondary | ICD-10-CM

## 2015-02-11 DIAGNOSIS — R7309 Other abnormal glucose: Secondary | ICD-10-CM

## 2015-02-11 DIAGNOSIS — L989 Disorder of the skin and subcutaneous tissue, unspecified: Secondary | ICD-10-CM

## 2015-02-11 DIAGNOSIS — E119 Type 2 diabetes mellitus without complications: Secondary | ICD-10-CM | POA: Diagnosis not present

## 2015-02-11 DIAGNOSIS — R7303 Prediabetes: Secondary | ICD-10-CM

## 2015-02-11 LAB — BASIC METABOLIC PANEL
BUN: 13 mg/dL (ref 6–23)
CO2: 26 mEq/L (ref 19–32)
Calcium: 9.2 mg/dL (ref 8.4–10.5)
Chloride: 100 mEq/L (ref 96–112)
Creatinine, Ser: 0.85 mg/dL (ref 0.40–1.20)
GFR: 88.84 mL/min (ref >60.00)
Glucose, Bld: 97 mg/dL (ref 70–99)
Potassium: 3.3 mEq/L — ABNORMAL LOW (ref 3.5–5.1)
Sodium: 137 mEq/L (ref 135–145)

## 2015-02-11 LAB — HEMOGLOBIN A1C: Hgb A1c MFr Bld: 6.1 % (ref 4.6–6.5)

## 2015-02-11 LAB — MICROALBUMIN / CREATININE URINE RATIO
Creatinine,U: 136 mg/dL
Microalb Creat Ratio: 0.5 mg/g (ref 0.0–30.0)
Microalb, Ur: <0.7 mg/dL (ref 0.0–1.9)

## 2015-02-11 MED ORDER — HYDROCHLOROTHIAZIDE 25 MG PO TABS: 25.0000 mg | ORAL_TABLET | Freq: Every day | ORAL | Status: DC

## 2015-02-11 MED ORDER — AMLODIPINE BESYLATE 5 MG PO TABS: 5.0000 mg | ORAL_TABLET | Freq: Every day | ORAL | Status: DC

## 2015-02-11 NOTE — Assessment & Plan Note (Signed)
BP stable on current meds.  Obtain bmet. 

## 2015-02-11 NOTE — Assessment & Plan Note (Signed)
Discussed that her LDL is extremely high.  Advised that she is at high risk for heart attack/stroke if left untreated. She is agreeable to start statin. Advised pt to start crestor.

## 2015-02-11 NOTE — Assessment & Plan Note (Signed)
Obtain Urine microalbumin, bmet , A1C.

## 2015-02-11 NOTE — Patient Instructions (Addendum)
Start crestor. Continue to work on low fat/low cholesterol diet, exercise, weight loss. Complete lab work prior to leaving. You will be contacted about your referral to dermatology  Please let us know if you have not heard back within 1 week about your referral. Schedule Eye and Dental appointment.  Receive Flu/PNA vaccine in the Fall.

## 2015-02-11 NOTE — Progress Notes (Signed)
Subjective:    Patient ID: [name], female    DOB: [birth year], 54 y.o.   MRN: [id number]  HPI  [name] is a 54 yr old female who presents today for follow up.   HTN- current meds include amlodipine 5mg  and HCTZ.  BP Readings from Last 3 Encounters:  02/11/15 130/78  12/22/14 159/77  10/06/14 126/80   Some dysphagia.   DM2- Lab Results  Component Value Date   HGBA1C 6.3 10/06/2014   HGBA1C 6.2 03/17/2014   HGBA1C 5.9* 10/02/2013   Lab Results  Component Value Date   MICROALBUR 0.88 10/02/2013   LDLCALC 238* 08/31/2014   CREATININE 0.78 11/01/2014   Hyperlipidemia- did not start statin due to risk of "side effects"  Review of Systems  Respiratory: Negative for shortness of breath.   Cardiovascular: Negative for chest pain and leg swelling.      see HPI  Past Medical History  Diagnosis Date  . Atypical chest pain   . Hyperlipidemia   . Hypertension   . GERD (gastroesophageal reflux disease)   . Diabetes mellitus, type II   . Constipation   . Routine general medical examination at a health care facility   . Gastroparesis   . Iron deficiency anemia   . Esophageal stricture   . Migraine headache   . Endometriosis   . Cyst, ovarian   . Peptic ulcer disease   . Non-compliance   . Allergy     allergic rhinitis    Social History   Social History  . Marital Status: Married    Spouse Name: N/A  . Number of Children: 2  . Years of Education: N/A   Occupational History  . DISABILITY    Social History Main Topics  . Smoking status: Former Smoker -- 0.50 packs/day for 18 years    Types: Cigarettes    Start date: 07/29/1977    Quit date: 06/25/1994  . Smokeless tobacco: Never Used     Comment: quit 19 years ago  . Alcohol Use: Yes     Comment: social drinker  . Drug Use: No  . Sexual Activity: Not on file   Other Topics Concern  . Not on file   Social History Narrative   Married    Has 2 grown children.  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   Former Smoker Quit tobacco in 1996.  She was a pack a day smoker for approximately 10 years.   Alcohol use-yes: Social    Daily Caffeine Use:6 pack of pepsi daily     Illicit Drug Use - no    Patient does not get regular exercise.       Smoking Status:  quit    Past Surgical History  Procedure Laterality Date  . Abdominal exploration surgery      w/bso   . Knee surgery  2005     left knee  . Total abdominal hysterectomy    . Cholecystectomy    . Cardiac catheterization  2009    mild non obstructive CAD    Family History  Problem Relation Age of Onset  . Hypertension Mother   . Hypertension Father   . Diabetes Father   . Prostate cancer Father   . Kidney disease Father   . Diabetes Sister   . Diabetes Maternal Grandmother   . Colon cancer Neg Hx   . Lung cancer Maternal Grandfather     Allergies  Allergen Reactions  . Ace Inhibitors  REACTION: chronic cough  . Celebrex [Celecoxib] Other (See Comments)    "makes me bleed"  . Diovan [Valsartan]     angioedema    Current Outpatient Prescriptions on File Prior to Visit  Medication Sig Dispense Refill  . amLODipine (NORVASC) 5 MG tablet TAKE ONE TABLET BY MOUTH ONCE DAILY 30 tablet 0  . aspirin EC 81 MG tablet Take 81 mg by mouth daily.    . hydrochlorothiazide (HYDRODIURIL) 25 MG tablet Take 1 tablet (25 mg total) by mouth daily. 30 tablet 5  . potassium chloride SA (K-DUR,KLOR-CON) 20 MEQ tablet Take 1 tablet (20 mEq total) by mouth daily. 30 tablet 3  . rosuvastatin (CRESTOR) 20 MG tablet Take 1 tablet (20 mg total) by mouth daily. 30 tablet 2  . traMADol (ULTRAM) 50 MG tablet TAKE ONE TABLET BY MOUTH EVERY 8 HOURS AS NEEDED 90 tablet 0  . [DISCONTINUED] famotidine (PEPCID) 20 MG tablet Take 1 tablet (20 mg total) by mouth 2 (two) times daily. 30 tablet 0   No current facility-administered medications on file prior to visit.    BP 130/78 mmHg  Pulse 61  Temp(Src) 97.5 F (36.4 C) (Oral)   Ht 5\' 4"  (1.626 m)  Wt 218 lb 6.4 oz (99.066 kg)  BMI 37.47 kg/m2  SpO2 99%    Objective:   Physical Exam  Constitutional: She appears well-developed and well-nourished.  Cardiovascular: Normal rate, regular rhythm and normal heart sounds.   No murmur heard. Pulmonary/Chest: Effort normal and breath sounds normal. No respiratory distress. She has no wheezes.  Musculoskeletal: She exhibits no edema.  Skin:  Has skin lesions at out canthus bilateral eyes.  Forearms/hands/feet have numerous flat raised lesions.   Psychiatric: She has a normal mood and affect. Her behavior is normal. Judgment and thought content normal.          Assessment & Plan:  Dysphagia- advised pt to arrange follow up with GI (Dr. Deatra Ina)  Skin lesions- bothering her vision. Will refer back to derm.  Will request last office visit as we never did receive copy detailing her chronic skin condition/diagnosis.

## 2015-02-11 NOTE — Progress Notes (Signed)
Pre visit review using our clinic review tool, if applicable. No additional management support is needed unless otherwise documented below in the visit note. 

## 2015-02-11 NOTE — Addendum Note (Signed)
Addended by: Rudene Anda on: 02/11/2015 10:33 AM   Modules accepted: Miquel Dunn

## 2015-02-11 NOTE — Progress Notes (Addendum)
Subjective:   [name] is a 54 y.o. female who presents for an Initial Medicare Annual Wellness Visit.  Review of Systems     Sleep patterns:  approx 5 hours each night/wake up 3 x during the night Home Safety/Smoke Alarms:  Lives at home with husband.  1 story home. Feels safe.  Smoke alarm present.  Firearm Safety:  No firearms.   Seat Belt Safety/Bike Helmet:  Always wears seat belt.    Counseling:   Eye Exam- Discussed, plan to schedule an appointment.   Dental- Discussed, plans to make an appointment.   Female:  Pap- Total Hysterectomy    Mammo- UTD    CCS-UTD Advanced Planning: Discussed briefly. Paperwork given for patient to review.   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  Pt states she does have some help with husband and routinely has "me time" to de-stress.    Objective:    Today's Vitals   02/11/15 0859  BP: 130/78  Pulse: 61  Temp: 97.5 F (36.4 C)  TempSrc: Oral  Height: 5\' 4"  (1.626 m)  Weight: 218 lb 6.4 oz (99.066 kg)  SpO2: 99%    Current Medications (verified) Outpatient Encounter Prescriptions as of 02/11/2015  Medication Sig  . amLODipine (NORVASC) 5 MG tablet Take 1 tablet (5 mg total) by mouth daily.  Marland Kitchen aspirin EC 81 MG tablet Take 81 mg by mouth daily.  . hydrochlorothiazide (HYDRODIURIL) 25 MG tablet Take 1 tablet (25 mg total) by mouth daily.  . potassium chloride SA (K-DUR,KLOR-CON) 20 MEQ tablet Take 1 tablet (20 mEq total) by mouth daily.  . rosuvastatin (CRESTOR) 20 MG tablet Take 1 tablet (20 mg total) by mouth daily.  . traMADol (ULTRAM) 50 MG tablet TAKE ONE TABLET BY MOUTH EVERY 8 HOURS AS NEEDED  . [DISCONTINUED] amLODipine (NORVASC) 5 MG tablet TAKE ONE TABLET BY MOUTH ONCE DAILY  . [DISCONTINUED] hydrochlorothiazide (HYDRODIURIL) 25 MG tablet Take 1 tablet (25 mg total) by mouth daily.   No facility-administered encounter medications on file as of 02/11/2015.    Allergies  (verified) Ace inhibitors; Celebrex; and Diovan   History: Past Medical History  Diagnosis Date  . Atypical chest pain   . Hyperlipidemia   . Hypertension   . GERD (gastroesophageal reflux disease)   . Diabetes mellitus, type II   . Constipation   . Routine general medical examination at a health care facility   . Gastroparesis   . Iron deficiency anemia   . Esophageal stricture   . Migraine headache   . Endometriosis   . Cyst, ovarian   . Peptic ulcer disease   . Non-compliance   . Allergy     allergic rhinitis   Past Surgical History  Procedure Laterality Date  . Abdominal exploration surgery      w/bso   . Knee surgery  2005     left knee  . Total abdominal hysterectomy    . Cholecystectomy    . Cardiac catheterization  2009    mild non obstructive CAD   Family History  Problem Relation Age of Onset  . Hypertension Mother   . Hypertension Father   . Diabetes Father   . Prostate cancer Father   . Kidney disease Father   . Diabetes Sister   . Diabetes Maternal Grandmother   . Colon cancer Neg Hx   . Lung cancer Maternal Grandfather    Social History   Occupational History  .  DISABILITY    Social History Main Topics  . Smoking status: Former Smoker -- 0.50 packs/day for 18 years    Types: Cigarettes    Start date: 07/29/1977    Quit date: 06/25/1994  . Smokeless tobacco: Never Used     Comment: quit 19 years ago  . Alcohol Use: 0.0 oz/week    0 Standard drinks or equivalent per week     Comment: social drinker  . Drug Use: No  . Sexual Activity: Not on file    Tobacco Counseling Counseling given: Yes   Activities of Daily Living In your present state of health, do you have any difficulty performing the following activities: 02/11/2015  Hearing? N  Vision? N  Difficulty concentrating or making decisions? N  Walking or climbing stairs? N  Dressing or bathing? N  Doing errands, shopping? N  Preparing Food and eating ? N  Using the Toilet? N   In the past six months, have you accidently leaked urine? N  Do you have problems with loss of bowel control? N  Managing your Medications? N  Managing your Finances? N  Housekeeping or managing your Housekeeping? N    Immunizations and Health Maintenance Immunization History  Administered Date(s) Administered  . Influenza Split 04/30/2011  . Influenza Whole 04/19/2006, 04/17/2007, 03/08/2009, 03/01/2010  . Influenza, Seasonal, Injecte, Preservative Fre 05/30/2012  . Influenza,inj,Quad PF,36+ Mos 02/15/2014  . Pneumococcal Polysaccharide-23 03/08/2009  . Td 05/17/1999  . Tdap 04/30/2011   Health Maintenance Due  Topic Date Due  . Hepatitis C Screening  01-12-1961  . OPHTHALMOLOGY EXAM  09/13/1968  . HIV Screening  09/13/1973  . FOOT EXAM  05/30/2013  . PNEUMOCOCCAL POLYSACCHARIDE VACCINE (2) 03/08/2014  . URINE MICROALBUMIN  10/03/2014    Patient Care Team: [name], NP as PCP - General (Internal Medicine) [name], MD as Consulting Physician (Internal Medicine) Amalia Greenhouse, MD as Consulting Physician (Endocrinology)  Indicate any recent Medical Services you may have received from other than [facility] providers in the past year (date may be approximate).     Assessment:   This is a routine wellness examination for Arica.  Hearing/Vision screen  Hearing Screening   125Hz  250Hz  500Hz  1000Hz  2000Hz  4000Hz  8000Hz   Right ear:    100     Left ear:    100     Vision Screening Comments: Last eye exam:  "it's been a while".  Plans to schedule an appointment.  No changes in vision.  Wears reading glasses.    Dietary issues and exercise activities discussed: Current Exercise Habits:: Home exercise routine, Type of exercise: Other - see comments (walking), Time (Minutes): 60, Frequency (Times/Week): 1, Weekly Exercise (Minutes/Week): 60, Intensity: Mild   Diet:  Pt describes diet as poor.  States she eats out a lot.  Eats a lot of carbs/sugary foods.  Eats meat  sometimes fried/ sometimes baked.  However, she loves fruit, yogurt, and drinks "plenty of water."  She shares that she also does [employer] 3 times a week.  She drinks 1 protein shake on those days.    BMI:  37.47 kg/m2   Goals    . Increase physical activity    . Reduce salt intake to 2 grams per day or less    . Reduce sugar intake      Depression Screen PHQ 2/9 Scores 02/11/2015  PHQ - 2 Score 0    Fall Risk Fall Risk  02/11/2015 04/08/2014 12/10/2013  Falls in the past year?  No No No    Cognitive Function: MMSE - Mini Mental State Exam 02/11/2015  Not completed: (No Data)   AD8 screening completed.  Score: 0.     Screening Tests Health Maintenance  Topic Date Due  . Hepatitis C Screening  [birth year]  . OPHTHALMOLOGY EXAM  09/13/1968  . HIV Screening  09/13/1973  . FOOT EXAM  05/30/2013  . PNEUMOCOCCAL POLYSACCHARIDE VACCINE (2) 03/08/2014  . URINE MICROALBUMIN  10/03/2014  . INFLUENZA VACCINE  04/13/2015 (Originally 01/24/2015)  . HEMOGLOBIN A1C  04/07/2015  . MAMMOGRAM  11/15/2016  . COLONOSCOPY  12/30/2018  . TETANUS/TDAP  04/29/2021      Plan:   Schedule Eye and Dental appointment.   Receive Flu/PNA vaccine in the Fall.  Providers plan reiterated with patient.  Pt stated understanding and agrees to follow plan.      During the course of the visit, Joane was educated and counseled about the following appropriate screening and preventive services:   Vaccines to include Pneumoccal, Influenza, Hepatitis B, Td, Zostavax, HCV  Electrocardiogram  Cardiovascular disease screening  Colorectal cancer screening  Bone density screening  Diabetes screening  Glaucoma screening  Mammography/PAP  Nutrition counseling  Smoking cessation counseling  Patient Instructions (the written plan) were given to the patient.    Rudene Anda, RN   02/11/2015

## 2015-02-12 ENCOUNTER — Telehealth: Payer: Self-pay | Admitting: Family

## 2015-02-12 DIAGNOSIS — E876 Hypokalemia: Secondary | ICD-10-CM

## 2015-02-12 NOTE — Telephone Encounter (Signed)
Potassium is low.  Advise 2 tabs by mouth today, then one tab by mouth once daily, repeat bmet in 1 week, dx hypokalemia.

## 2015-02-14 MED ORDER — POTASSIUM CHLORIDE CRYS ER 20 MEQ PO TBCR: EXTENDED_RELEASE_TABLET | ORAL | Status: DC

## 2015-02-14 NOTE — Telephone Encounter (Signed)
Pt notified and made aware.  She stated understanding and agreed.  Lab appointment scheduled for 02/22/15. Future lab ordered.

## 2015-02-18 ENCOUNTER — Other Ambulatory Visit (INDEPENDENT_AMBULATORY_CARE_PROVIDER_SITE_OTHER): Payer: Medicare PPO

## 2015-02-18 DIAGNOSIS — E876 Hypokalemia: Secondary | ICD-10-CM | POA: Diagnosis not present

## 2015-02-18 LAB — BASIC METABOLIC PANEL
BUN: 15 mg/dL (ref 6–23)
CO2: 27 mEq/L (ref 19–32)
Calcium: 9.5 mg/dL (ref 8.4–10.5)
Chloride: 101 mEq/L (ref 96–112)
Creatinine, Ser: 0.76 mg/dL (ref 0.40–1.20)
GFR: 101.09 mL/min (ref >60.00)
Glucose, Bld: 123 mg/dL — ABNORMAL HIGH (ref 70–99)
Potassium: 3.5 mEq/L (ref 3.5–5.1)
Sodium: 137 mEq/L (ref 135–145)

## 2015-02-19 ENCOUNTER — Encounter: Payer: Self-pay | Admitting: Family

## 2015-02-22 ENCOUNTER — Other Ambulatory Visit: Payer: Medicare PPO

## 2015-03-04 ENCOUNTER — Ambulatory Visit: Payer: Medicare PPO | Admitting: Family

## 2015-03-08 ENCOUNTER — Ambulatory Visit (INDEPENDENT_AMBULATORY_CARE_PROVIDER_SITE_OTHER): Payer: Medicare PPO | Admitting: Family

## 2015-03-08 ENCOUNTER — Encounter: Payer: Self-pay | Admitting: Family

## 2015-03-08 VITALS — BP 145/83 | HR 66 | Temp 98.2°F | Resp 16 | Ht 64.0 in

## 2015-03-08 DIAGNOSIS — H029 Unspecified disorder of eyelid: Secondary | ICD-10-CM | POA: Diagnosis not present

## 2015-03-08 DIAGNOSIS — Z23 Encounter for immunization: Secondary | ICD-10-CM

## 2015-03-08 DIAGNOSIS — M62830 Muscle spasm of back: Secondary | ICD-10-CM | POA: Diagnosis not present

## 2015-03-08 MED ORDER — CYCLOBENZAPRINE HCL 5 MG PO TABS: 5.0000 mg | ORAL_TABLET | Freq: Three times a day (TID) | ORAL | Status: DC | PRN

## 2015-03-08 NOTE — Progress Notes (Signed)
Subjective:    Patient ID: [name], female    DOB: 12/08/1960, 54 y.o.   MRN: [id number]  HPI   [name] is a 54 yr old female who presents today with chief complaint of back pain. She reports + muscle spasms in the lower right side of her back.  Pt reports that she has been using tylenol.  Pain is non-radiating and not associated with any leg weakness.  Pain has been present for a number of months- pt is unsure of number of months.  She had an MRI of the lumbar spin performed back in 4/12 which noted mild desiccation and bulging o the L4-5 and L5-S1 discs.    Patient has multiple lesions on her eyelids. Lesions are causing obstruction of her peripheral vision and starting to bother her. She would like these lesions removed.   Review of Systems    see HPI  Past Medical History  Diagnosis Date  . Atypical chest pain   . Hyperlipidemia   . Hypertension   . GERD (gastroesophageal reflux disease)   . Diabetes mellitus, type II   . Constipation   . Routine general medical examination at a health care facility   . Gastroparesis   . Iron deficiency anemia   . Esophageal stricture   . Migraine headache   . Endometriosis   . Cyst, ovarian   . Peptic ulcer disease   . Non-compliance   . Allergy     allergic rhinitis  . 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 Marital Status: Married    Spouse Name: N/A  . Number of Children: 2  . Years of Education: N/A   Occupational History  . DISABILITY    Social History Main Topics  . Smoking status: Former Smoker -- 0.50 packs/day for 18 years    Types: Cigarettes    Start date: 07/29/1977    Quit date: 06/25/1994  . Smokeless tobacco: Never Used     Comment: quit 19 years ago  . Alcohol Use: 0.0 oz/week    0 Standard drinks or equivalent per week     Comment: social drinker  . Drug Use: No  . Sexual Activity: Not on file    Other Topics Concern  . Not on file   Social History Narrative   Married    Has 2 grown children.  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   Former Smoker Quit tobacco in 1996.  She was a pack a day smoker for approximately 10 years.   Alcohol use-yes: Social    Daily Caffeine Use:6 pack of pepsi daily     Illicit Drug Use - no    Patient does not get regular exercise.       Smoking Status:  quit    Past Surgical History  Procedure Laterality Date  . Abdominal exploration surgery      w/bso   . Knee surgery  2005     left knee  . Total abdominal hysterectomy    . Cholecystectomy    . Cardiac catheterization  2009    mild non obstructive CAD    Family History  Problem Relation Age of Onset  . Hypertension Mother   . Rheum arthritis Mother   . Hypertension Father   . Diabetes Father   . Prostate cancer Father   . Kidney disease Father   . Diabetes Sister   .  Fibromyalgia Sister   . Diabetes Maternal Grandmother   . Colon cancer Neg Hx   . Lung cancer Maternal Grandfather     Allergies  Allergen Reactions  . Ace Inhibitors     REACTION: chronic cough  . Celebrex [Celecoxib] Other (See Comments)    "makes me bleed"  . Diovan [Valsartan]     angioedema    Current Outpatient Prescriptions on File Prior to Visit  Medication Sig Dispense Refill  . amLODipine (NORVASC) 5 MG tablet Take 1 tablet (5 mg total) by mouth daily. 90 tablet 1  . aspirin EC 81 MG tablet Take 81 mg by mouth daily.    . hydrochlorothiazide (HYDRODIURIL) 25 MG tablet Take 1 tablet (25 mg total) by mouth daily. 3090 tablet 1  . potassium chloride SA (K-DUR,KLOR-CON) 20 MEQ tablet 2 tabs by mouth today (02/14/15), then one tab by mouth once daily. 30 tablet 0  . rosuvastatin (CRESTOR) 20 MG tablet Take 1 tablet (20 mg total) by mouth daily. 30 tablet 2  . traMADol (ULTRAM) 50 MG tablet TAKE ONE TABLET BY MOUTH EVERY 8 HOURS AS NEEDED 90 tablet 0  . [DISCONTINUED] famotidine (PEPCID) 20 MG  tablet Take 1 tablet (20 mg total) by mouth 2 (two) times daily. 30 tablet 0   No current facility-administered medications on file prior to visit.    BP 145/83 mmHg  Pulse 66  Temp(Src) 98.2 F (36.8 C) (Oral)  Resp 16  Ht 5\' 4"  (1.626 m)  Wt   SpO2 100%    Objective:   Physical Exam  Constitutional: She is oriented to person, place, and time. She appears well-developed and well-nourished.  Eyes:  Cyst like lesions noted near outer canthi bilatally  Cardiovascular: Normal rate, regular rhythm and normal heart sounds.   No murmur heard. Pulmonary/Chest: Effort normal and breath sounds normal. No respiratory distress. She has no wheezes.  Musculoskeletal:       Cervical back: She exhibits no bony tenderness.       Thoracic back: She exhibits no tenderness.       Lumbar back: She exhibits tenderness. She exhibits no bony tenderness.  Neurological: She is alert and oriented to person, place, and time.  Psychiatric: She has a normal mood and affect. Her behavior is normal. Judgment and thought content normal.          Assessment & Plan:  Back Spasm- trial of short term prn flexeril. Pt is advised to call if symptoms worsen or do not improve. She cannot take nsaids.  Consider PT referral if symptoms worsen or do not improve.   Eyelid lesions- will refer to opthalmology for excision.

## 2015-03-08 NOTE — Progress Notes (Signed)
Pre visit review using our clinic review tool, if applicable. No additional management support is needed unless otherwise documented below in the visit note. 

## 2015-03-08 NOTE — Patient Instructions (Signed)
You will be contacted about your referral to the eye doctor. You may use flexeril as needed for back spasm.  Call if symptoms worsen or if symptoms do not improve.

## 2015-03-24 ENCOUNTER — Other Ambulatory Visit: Payer: Medicare PPO

## 2015-03-24 ENCOUNTER — Ambulatory Visit: Payer: Medicare PPO | Admitting: Hematology & Oncology

## 2015-03-25 ENCOUNTER — Telehealth: Payer: Self-pay | Admitting: Family

## 2015-03-25 ENCOUNTER — Telehealth: Payer: Self-pay | Admitting: Hematology & Oncology

## 2015-03-25 ENCOUNTER — Other Ambulatory Visit: Payer: Self-pay | Admitting: Family

## 2015-03-25 MED ORDER — CYCLOBENZAPRINE HCL 5 MG PO TABS: 5.0000 mg | ORAL_TABLET | Freq: Three times a day (TID) | ORAL | Status: DC | PRN

## 2015-03-25 NOTE — Telephone Encounter (Signed)
rx sent

## 2015-03-25 NOTE — Telephone Encounter (Signed)
Caller name: Atalya Relation to pt: self Call back number: [phone] Pharmacy: [facility] [place] [address]  Reason for call: Pt came in office stating still having back pains and is wanting to know if she can have rx for muscle relaxer since rx that she had has finished.  Please advise.

## 2015-03-25 NOTE — Telephone Encounter (Signed)
Patient walked into office and resch 03/24/15 missed apt to 03/31/15

## 2015-03-25 NOTE — Telephone Encounter (Signed)
Notified pt. 

## 2015-03-28 NOTE — Telephone Encounter (Signed)
Received request from [place] for:  Name from pharmacy:  In chart as:  PHENADOZ 25MG  SUP PHENADOZ 25 MG suppository     The source prescription has been discontinued.    Sig: INSERT ONE SUPPOSITORY RECTALLY AS NEEDED    Dispense: 12 suppository   Refills: 0   Start: 03/25/2015   Class: Normal    Requested on: 12/15/2013    Originally ordered on: 05/20/2013 08/19/2014      Medication not on current med list.  Attempted to verify request with pt but voice mailbox is full. Will try again later.

## 2015-03-28 NOTE — Telephone Encounter (Signed)
rx sent

## 2015-03-28 NOTE — Telephone Encounter (Signed)
Pt states she uses phenergan suppositories when she gets migraines.  Please advise?

## 2015-03-31 ENCOUNTER — Other Ambulatory Visit: Payer: Medicare PPO

## 2015-03-31 ENCOUNTER — Ambulatory Visit: Payer: Medicare PPO | Admitting: Hematology & Oncology

## 2015-04-08 ENCOUNTER — Encounter: Payer: Self-pay | Admitting: Hematology & Oncology

## 2015-04-08 ENCOUNTER — Ambulatory Visit (HOSPITAL_BASED_OUTPATIENT_CLINIC_OR_DEPARTMENT_OTHER): Payer: Medicare PPO | Admitting: Hematology & Oncology

## 2015-04-08 ENCOUNTER — Other Ambulatory Visit (HOSPITAL_BASED_OUTPATIENT_CLINIC_OR_DEPARTMENT_OTHER): Payer: Medicare PPO

## 2015-04-08 VITALS — BP 119/75 | HR 65 | Temp 97.7°F | Resp 16 | Ht 64.0 in | Wt 224.0 lb

## 2015-04-08 DIAGNOSIS — R7303 Prediabetes: Secondary | ICD-10-CM | POA: Diagnosis not present

## 2015-04-08 DIAGNOSIS — D509 Iron deficiency anemia, unspecified: Secondary | ICD-10-CM

## 2015-04-08 LAB — RETICULOCYTES (CHCC)
ABS Retic: 48.5 10*3/uL (ref 19.0–186.0)
RBC.: 4.85 MIL/uL (ref 3.87–5.11)
Retic Ct Pct: 1 % (ref 0.4–2.3)

## 2015-04-08 LAB — CBC WITH DIFFERENTIAL (CANCER CENTER ONLY)
BASO#: 0 10*3/uL (ref 0.0–0.2)
BASO%: 0.2 % (ref 0.0–2.0)
EOS%: 1.9 % (ref 0.0–7.0)
Eosinophils Absolute: 0.1 10*3/uL (ref 0.0–0.5)
HCT: 36.7 % (ref 34.8–46.6)
HGB: 11.4 g/dL — ABNORMAL LOW (ref 11.6–15.9)
LYMPH#: 2.9 10*3/uL (ref 0.9–3.3)
LYMPH%: 44.5 % (ref 14.0–48.0)
MCH: 23.1 pg — ABNORMAL LOW (ref 26.0–34.0)
MCHC: 31.1 g/dL — ABNORMAL LOW (ref 32.0–36.0)
MCV: 74 fL — ABNORMAL LOW (ref 81–101)
MONO#: 0.4 10*3/uL (ref 0.1–0.9)
MONO%: 5.9 % (ref 0.0–13.0)
NEUT#: 3 10*3/uL (ref 1.5–6.5)
NEUT%: 47.5 % (ref 39.6–80.0)
Platelets: 212 10*3/uL (ref 145–400)
RBC: 4.93 10*6/uL (ref 3.70–5.32)
RDW: 13.6 % (ref 11.1–15.7)
WBC: 6.4 10*3/uL (ref 3.9–10.0)

## 2015-04-08 LAB — IRON AND TIBC CHCC
%SAT: 37 % (ref 21–57)
Iron: 93 ug/dL (ref 41–142)
TIBC: 248 ug/dL (ref 236–444)
UIBC: 155 ug/dL (ref 120–384)

## 2015-04-08 LAB — FERRITIN CHCC: Ferritin: 981 ng/ml — ABNORMAL HIGH (ref 9–269)

## 2015-04-08 NOTE — Progress Notes (Signed)
Hematology and Oncology Follow Up Visit  [name] [id number] 1960-08-31 54 y.o. 04/08/2015   Principle Diagnosis:   Recurrent iron deficiency anemia  Current Therapy:    IV iron as indicated     Interim History:  [name] is back for followup. She had a pretty decent summer. She is still dealing with her husband who has dementia. He apparently had some eye surgery and this was a little bit of a difficulty for him.  A grandson of hers, who has lymphoma, and he is less than 17 years old, had recurrent CNS disease that needed surgery.  She is doing her best to try to keep everything together.  A brother of hers was involved with the hurricane in the [place] part of the state. He just got his electricity turned back on. Thankfully he did not have any flooding  She last had a iron back in 2014.  Her last iron studies done in June of this year showed ferritin of 893 with an iron saturation of 34%.  She has had no bleeding.  She had her mammogram recently. This was in May 2016. Everything looked okay on the mammogram.  She's had no cough. She's had no infections. She's had no change in bowel or bladder habits.  Overall, her performance status is ECOG 1.    Medications:  Current outpatient prescriptions:  .  amLODipine (NORVASC) 5 MG tablet, Take 1 tablet (5 mg total) by mouth daily., Disp: 90 tablet, Rfl: 1 .  aspirin EC 81 MG tablet, Take 81 mg by mouth daily., Disp: , Rfl:  .  cyclobenzaprine (FLEXERIL) 5 MG tablet, Take 1 tablet (5 mg total) by mouth 3 (three) times daily as needed for muscle spasms., Disp: 20 tablet, Rfl: 0 .  hydrochlorothiazide (HYDRODIURIL) 25 MG tablet, Take 1 tablet (25 mg total) by mouth daily., Disp: 3090 tablet, Rfl: 1 .  PHENADOZ 25 MG suppository, INSERT ONE SUPPOSITORY RECTALLY AS NEEDED, Disp: 12 suppository, Rfl: 0 .  potassium chloride SA (K-DUR,KLOR-CON) 20 MEQ tablet, 2 tabs by mouth today (02/14/15), then one tab by mouth once  daily., Disp: 30 tablet, Rfl: 0 .  rosuvastatin (CRESTOR) 20 MG tablet, Take 1 tablet (20 mg total) by mouth daily., Disp: 30 tablet, Rfl: 2 .  traMADol (ULTRAM) 50 MG tablet, TAKE ONE TABLET BY MOUTH EVERY 8 HOURS AS NEEDED, Disp: 90 tablet, Rfl: 0 .  [DISCONTINUED] famotidine (PEPCID) 20 MG tablet, Take 1 tablet (20 mg total) by mouth 2 (two) times daily., Disp: 30 tablet, Rfl: 0  Allergies:  Allergies  Allergen Reactions  . Ace Inhibitors     REACTION: chronic cough  . Celebrex [Celecoxib] Other (See Comments)    "makes me bleed"  . Diovan [Valsartan]     angioedema    Past Medical History, Surgical history, Social history, and Family History were reviewed and updated.  Review of Systems: As above  Physical Exam:  height is 5\' 4"  (1.626 m) and weight is 224 lb (101.606 kg). Her oral temperature is 97.7 F (36.5 C). Her blood pressure is 119/75 and her pulse is 65. Her respiration is 16.   Well-developed and well-nourished Afro-American female. Head and neck exam shows no ocular or oral lesions. She has no adenopathy in her neck. Thyroid is nonpalpable. Lungs are clear. Cardiac exam regular rate and rhythm with no murmurs rubs or bruits. Abdomen is soft. She has good bowel sounds. There is no fluid wave. There is no palpable liver  or spleen tip. Back exam shows no tenderness over the spine ribs or hips. Extremities shows no clubbing cyanosis or edema. Skin exam shows these papular lesions on her extremities. Neurological exam is nonfocal.  Lab Results  Component Value Date   WBC 6.4 04/08/2015   HGB 11.4* 04/08/2015   HCT 36.7 04/08/2015   MCV 74* 04/08/2015   PLT 212 04/08/2015     Chemistry      Component Value Date/Time   NA 137 02/18/2015 1410   K 3.5 02/18/2015 1410   CL 101 02/18/2015 1410   CO2 27 02/18/2015 1410   BUN 15 02/18/2015 1410   CREATININE 0.76 02/18/2015 1410   CREATININE 0.74 10/02/2013 0821      Component Value Date/Time   CALCIUM 9.5 02/18/2015  1410   ALKPHOS 114 06/28/2014 1847   AST 34 06/28/2014 1847   ALT 41* 06/28/2014 1847   BILITOT 0.3 06/28/2014 1847         Impression and Plan: [name] is 54 year old African American female with recurrent iron deficiency anemia. So far, she's been doing quite well. We have not had to give her iron now for 2 1/2years. The last time she was back in January of 2014.  We will plan for another follow-up in 3-4 months.  I do not see that any additional studies need to be done.  [name], MD 10/14/201611:04 AM

## 2015-04-25 DIAGNOSIS — D2312 Other benign neoplasm of skin of left eyelid, including canthus: Secondary | ICD-10-CM | POA: Diagnosis not present

## 2015-04-25 DIAGNOSIS — D2311 Other benign neoplasm of skin of right eyelid, including canthus: Secondary | ICD-10-CM | POA: Diagnosis not present

## 2015-05-18 ENCOUNTER — Encounter: Payer: Self-pay | Admitting: Cardiology

## 2015-05-25 ENCOUNTER — Telehealth: Payer: Self-pay | Admitting: *Deleted

## 2015-05-25 NOTE — Telephone Encounter (Signed)
Patient requesting refills:  Last cyclobenzaprine Rx 03/25/15, #20 Last tramadol Rx 10/06/14, #90. Last UDS: 10/06/14 Next UDS: due now  Please advise?

## 2015-05-25 NOTE — Telephone Encounter (Signed)
OK to send in refills as below. Will plan UDS at her next office visit.

## 2015-05-26 MED ORDER — CYCLOBENZAPRINE HCL 5 MG PO TABS: 5.0000 mg | ORAL_TABLET | Freq: Three times a day (TID) | ORAL | Status: DC | PRN

## 2015-05-26 MED ORDER — TRAMADOL HCL 50 MG PO TABS: ORAL_TABLET | ORAL | Status: DC

## 2015-05-26 NOTE — Telephone Encounter (Signed)
Medication filled to pharmacy as requested.   

## 2015-07-18 ENCOUNTER — Encounter: Payer: Self-pay | Admitting: Gastroenterology

## 2015-07-18 ENCOUNTER — Encounter: Payer: Self-pay | Admitting: Internal Medicine

## 2015-07-26 ENCOUNTER — Telehealth: Payer: Self-pay | Admitting: *Deleted

## 2015-07-26 MED ORDER — PROMETHAZINE HCL 25 MG RE SUPP: RECTAL | Status: DC

## 2015-07-26 MED ORDER — SUMATRIPTAN SUCCINATE 50 MG PO TABS: 50.0000 mg | ORAL_TABLET | ORAL | Status: DC | PRN

## 2015-07-26 NOTE — Telephone Encounter (Signed)
Imitrex Rx printed and was faxed to pharmacy.

## 2015-07-26 NOTE — Telephone Encounter (Signed)
Pt called stating she has had a migraine since the weekend. Having nausea. Pt is out of imitrex and phenergan (suppository). Pt is requesting that we send refills.  Please advise.

## 2015-07-26 NOTE — Telephone Encounter (Signed)
Left detailed message on home # re: Rx completion and to check with pharmacy later today.

## 2015-07-26 NOTE — Telephone Encounter (Signed)
rxs sent

## 2015-08-09 ENCOUNTER — Ambulatory Visit: Payer: Medicare PPO | Admitting: Hematology & Oncology

## 2015-08-09 ENCOUNTER — Other Ambulatory Visit: Payer: Medicare PPO

## 2015-08-15 ENCOUNTER — Encounter: Payer: Self-pay | Admitting: Family

## 2015-08-15 ENCOUNTER — Ambulatory Visit (INDEPENDENT_AMBULATORY_CARE_PROVIDER_SITE_OTHER): Payer: Medicare Other | Admitting: Family

## 2015-08-15 VITALS — BP 121/77 | HR 73 | Temp 98.7°F | Resp 16 | Ht 64.0 in | Wt 217.6 lb

## 2015-08-15 DIAGNOSIS — E785 Hyperlipidemia, unspecified: Secondary | ICD-10-CM | POA: Diagnosis not present

## 2015-08-15 DIAGNOSIS — R112 Nausea with vomiting, unspecified: Secondary | ICD-10-CM | POA: Diagnosis not present

## 2015-08-15 LAB — POCT INFLUENZA A/B
Influenza A, POC: NEGATIVE
Influenza B, POC: NEGATIVE

## 2015-08-15 MED ORDER — ONDANSETRON 4 MG PO TBDP: 4.0000 mg | ORAL_TABLET | Freq: Three times a day (TID) | ORAL | Status: DC | PRN

## 2015-08-15 MED ORDER — ONDANSETRON HCL 4 MG/2ML IJ SOLN
4.0000 mg | Freq: Once | INTRAMUSCULAR | Status: AC
  Administered 2015-08-15: 4 mg via INTRAMUSCULAR

## 2015-08-15 MED ORDER — ATORVASTATIN CALCIUM 40 MG PO TABS: 40.0000 mg | ORAL_TABLET | Freq: Every day | ORAL | Status: DC

## 2015-08-15 NOTE — Progress Notes (Signed)
Pre visit review using our clinic review tool, if applicable. No additional management support is needed unless otherwise documented below in the visit note. 

## 2015-08-15 NOTE — Assessment & Plan Note (Signed)
Trial of atorvastatin due to cost.

## 2015-08-15 NOTE — Progress Notes (Signed)
Subjective:    Patient ID: [name], female    DOB: 31-Aug-1960, 55 y.o.   MRN: [id number]  HPI  [name] is a 55 yr old female who presents today with c/o N/V/HA and abdominal pain since yesterday. Reports + fatigue.  + myalgia. Denies fever. Denies sick contacts.   Hyperlipidemia- reports crestor was too expensive.   Lab Results  Component Value Date   CHOL 292* 11/01/2014   HDL 44.20 11/01/2014   LDLCALC 238* 08/31/2014   LDLDIRECT 204.0 11/01/2014   TRIG 233.0* 11/01/2014   CHOLHDL 7 11/01/2014    Review of Systems See HPI  Past Medical History  Diagnosis Date  . Atypical chest pain   . Hyperlipidemia   . Hypertension   . GERD (gastroesophageal reflux disease)   . Diabetes mellitus, type II ([place])   . Constipation   . Routine general medical examination at a health care facility   . Gastroparesis   . Iron deficiency anemia   . Esophageal stricture   . Migraine headache   . Endometriosis   . Cyst, ovarian   . Peptic ulcer disease   . Non-compliance   . Allergy     allergic rhinitis  . 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 Marital Status: Married    Spouse Name: N/A  . Number of Children: 2  . Years of Education: N/A   Occupational History  . DISABILITY    Social History Main Topics  . Smoking status: Former Smoker -- 0.50 packs/day for 18 years    Types: Cigarettes    Start date: 07/29/1977    Quit date: 06/25/1994  . Smokeless tobacco: Never Used     Comment: quit 19 years ago  . Alcohol Use: 0.0 oz/week    0 Standard drinks or equivalent per week     Comment: social drinker  . Drug Use: No  . Sexual Activity: Not on file   Other Topics Concern  . Not on file   Social History Narrative   Married    Has 2 grown children.  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   Former Smoker Quit tobacco in 1996.  She was a pack a day smoker for  approximately 10 years.   Alcohol use-yes: Social    Daily Caffeine Use:6 pack of pepsi daily     Illicit Drug Use - no    Patient does not get regular exercise.       Smoking Status:  quit    Past Surgical History  Procedure Laterality Date  . Abdominal exploration surgery      w/bso   . Knee surgery  2005     left knee  . Total abdominal hysterectomy    . Cholecystectomy    . Cardiac catheterization  2009    mild non obstructive CAD    Family History  Problem Relation Age of Onset  . Hypertension Mother   . Rheum arthritis Mother   . Hypertension Father   . Diabetes Father   . Prostate cancer Father   . Kidney disease Father   . Diabetes Sister   . Fibromyalgia Sister   . Diabetes Maternal Grandmother   . Colon cancer Neg Hx   . Lung cancer Maternal Grandfather     Allergies  Allergen Reactions  . Ace Inhibitors     REACTION: chronic cough  . Celebrex [  Celecoxib] Other (See Comments)    "makes me bleed"  . Diovan [Valsartan]     angioedema    Current Outpatient Prescriptions on File Prior to Visit  Medication Sig Dispense Refill  . amLODipine (NORVASC) 5 MG tablet Take 1 tablet (5 mg total) by mouth daily. 90 tablet 1  . aspirin EC 81 MG tablet Take 81 mg by mouth daily.    . cyclobenzaprine (FLEXERIL) 5 MG tablet Take 1 tablet (5 mg total) by mouth 3 (three) times daily as needed for muscle spasms. 20 tablet 0  . hydrochlorothiazide (HYDRODIURIL) 25 MG tablet Take 1 tablet (25 mg total) by mouth daily. 3090 tablet 1  . potassium chloride SA (K-DUR,KLOR-CON) 20 MEQ tablet 2 tabs by mouth today (02/14/15), then one tab by mouth once daily. 30 tablet 0  . promethazine (PHENADOZ) 25 MG suppository INSERT ONE SUPPOSITORY RECTALLY AS NEEDED 12 suppository 0  . rosuvastatin (CRESTOR) 20 MG tablet Take 1 tablet (20 mg total) by mouth daily. 30 tablet 2  . SUMAtriptan (IMITREX) 50 MG tablet Take 1 tablet (50 mg total) by mouth every 2 (two) hours as needed for  migraine. May repeat in 2 hours if headache persists or recurs. Max 2 tabs/24 hrs 10 tablet 2  . traMADol (ULTRAM) 50 MG tablet TAKE ONE TABLET BY MOUTH EVERY 8 HOURS AS NEEDED 90 tablet 0  . [DISCONTINUED] famotidine (PEPCID) 20 MG tablet Take 1 tablet (20 mg total) by mouth 2 (two) times daily. 30 tablet 0   No current facility-administered medications on file prior to visit.    BP 121/77 mmHg  Pulse 73  Temp(Src) 98.7 F (37.1 C) (Oral)  Resp 16  Ht 5\' 4"  (1.626 m)  Wt 217 lb 9.6 oz (98.703 kg)  BMI 37.33 kg/m2  SpO2 98%       Objective:   Physical Exam  Constitutional: She is oriented to person, place, and time. She appears well-developed and well-nourished.  HENT:  Head: Normocephalic and atraumatic.  Cardiovascular: Normal rate, regular rhythm and normal heart sounds.   No murmur heard. Pulmonary/Chest: Effort normal and breath sounds normal. No respiratory distress. She has no wheezes.  Abdominal: Soft. She exhibits no distension. There is no tenderness. There is no rebound.  Musculoskeletal: She exhibits no edema.  Lymphadenopathy:    She has no cervical adenopathy.  Neurological: She is alert and oriented to person, place, and time.  Psychiatric: She has a normal mood and affect. Her behavior is normal. Judgment and thought content normal.          Assessment & Plan:  Viral Gastroenteritis- Rapid flu swab is negative. zofran injection given in office today, PO zofran rx for home.  Pt is advised as follows:   Drink small frequent sips of water. If you are unable to keep down fluids despite use of zofran, please go to the ER for further evaluation and IV fluids.

## 2015-08-15 NOTE — Patient Instructions (Addendum)
Please start atorvastatin (lipitor) for your cholesterol. Stop Crestor, follow up in 6 weeks for lab visit to recheck your cholesterol.  You may use zofran 4mg  every 8 hours as needed for nausea. Drink small frequent sips of water. If you are unable to keep down fluids despite use of zofran, please go to the ER for further evaluation and IV fluids.

## 2015-08-16 ENCOUNTER — Ambulatory Visit (HOSPITAL_BASED_OUTPATIENT_CLINIC_OR_DEPARTMENT_OTHER): Payer: Medicare Other | Admitting: Family

## 2015-08-16 ENCOUNTER — Encounter: Payer: Self-pay | Admitting: Family

## 2015-08-16 ENCOUNTER — Other Ambulatory Visit (HOSPITAL_BASED_OUTPATIENT_CLINIC_OR_DEPARTMENT_OTHER): Payer: Medicare Other

## 2015-08-16 ENCOUNTER — Telehealth: Payer: Self-pay | Admitting: *Deleted

## 2015-08-16 VITALS — BP 121/73 | HR 71 | Temp 98.1°F | Resp 16 | Ht 64.0 in | Wt 215.0 lb

## 2015-08-16 DIAGNOSIS — D509 Iron deficiency anemia, unspecified: Secondary | ICD-10-CM

## 2015-08-16 DIAGNOSIS — R5383 Other fatigue: Secondary | ICD-10-CM

## 2015-08-16 DIAGNOSIS — G8929 Other chronic pain: Secondary | ICD-10-CM

## 2015-08-16 DIAGNOSIS — M545 Low back pain: Secondary | ICD-10-CM

## 2015-08-16 DIAGNOSIS — R7303 Prediabetes: Secondary | ICD-10-CM

## 2015-08-16 LAB — CBC WITH DIFFERENTIAL (CANCER CENTER ONLY)
BASO#: 0 10*3/uL (ref 0.0–0.2)
BASO%: 0.2 % (ref 0.0–2.0)
EOS%: 0.9 % (ref 0.0–7.0)
Eosinophils Absolute: 0.1 10*3/uL (ref 0.0–0.5)
HCT: 37.9 % (ref 34.8–46.6)
HGB: 12 g/dL (ref 11.6–15.9)
LYMPH#: 1.7 10*3/uL (ref 0.9–3.3)
LYMPH%: 29.8 % (ref 14.0–48.0)
MCH: 22.8 pg — ABNORMAL LOW (ref 26.0–34.0)
MCHC: 31.7 g/dL — ABNORMAL LOW (ref 32.0–36.0)
MCV: 72 fL — ABNORMAL LOW (ref 81–101)
MONO#: 0.6 10*3/uL (ref 0.1–0.9)
MONO%: 10.4 % (ref 0.0–13.0)
NEUT#: 3.4 10*3/uL (ref 1.5–6.5)
NEUT%: 58.7 % (ref 39.6–80.0)
Platelets: 236 10*3/uL (ref 145–400)
RBC: 5.27 10*6/uL (ref 3.70–5.32)
RDW: 13.8 % (ref 11.1–15.7)
WBC: 5.7 10*3/uL (ref 3.9–10.0)

## 2015-08-16 LAB — IRON AND TIBC
%SAT: 23 % (ref 21–57)
Iron: 55 ug/dL (ref 41–142)
TIBC: 239 ug/dL (ref 236–444)
UIBC: 183 ug/dL (ref 120–384)

## 2015-08-16 LAB — CMP (CANCER CENTER ONLY)
ALT(SGPT): 37 U/L (ref 10–47)
AST: 32 U/L (ref 11–38)
Albumin: 3.5 g/dL (ref 3.3–5.5)
Alkaline Phosphatase: 137 U/L — ABNORMAL HIGH (ref 26–84)
BUN, Bld: 14 mg/dL (ref 7–22)
CO2: 30 mEq/L (ref 18–33)
Calcium: 9.2 mg/dL (ref 8.0–10.3)
Chloride: 99 mEq/L (ref 98–108)
Creat: 1 mg/dl (ref 0.6–1.2)
Glucose, Bld: 107 mg/dL (ref 73–118)
Potassium: 3 mEq/L — CL (ref 3.3–4.7)
Sodium: 130 mEq/L (ref 128–145)
Total Bilirubin: 0.6 mg/dl (ref 0.20–1.60)
Total Protein: 7.5 g/dL (ref 6.4–8.1)

## 2015-08-16 LAB — FERRITIN: Ferritin: 1938 ng/ml — ABNORMAL HIGH (ref 9–269)

## 2015-08-16 NOTE — Progress Notes (Signed)
Hematology and Oncology Follow Up Visit  [name] [id number] 11-11-1960 55 y.o. 08/16/2015   Principle Diagnosis:  Recurrent iron deficiency anemia  Current Therapy:   IV iron as indicated - last dose in February 2016    Interim History: [name] is here today for follow-up. She is just getting over a stomach virus. Her husband is currently in the hospital due to complications of this. She still feels weak and fatigued. She has had several episodes of nausea and vomiting. No diarrhea.  Her appetite has been down the last week or so due to the virus. She states that she is staying well hydrated with water and Gatorade. Her weight is down 9 lbs since her last visit.  She has also been chewing ice quite a bit.  She denies fever, chills, cough, rash, headache, dizziness, SOB, chest pain, palpitations or changes in bowel or bladder habits. Her last dose of Feraheme was in February of last year. Her iron studies have been good at her last few visits.  No change with her chronic back pain or occasional numbness and tingling in the fingers in her right hand. No swelling in her extremities.   Medications:    Medication List       This list is accurate as of: 08/16/15 11:32 AM.  Always use your most recent med list.               amLODipine 5 MG tablet  Commonly known as:  NORVASC  Take 1 tablet (5 mg total) by mouth daily.     aspirin EC 81 MG tablet  Take 81 mg by mouth daily.     atorvastatin 40 MG tablet  Commonly known as:  LIPITOR  Take 1 tablet (40 mg total) by mouth daily.     cyclobenzaprine 5 MG tablet  Commonly known as:  FLEXERIL  Take 1 tablet (5 mg total) by mouth 3 (three) times daily as needed for muscle spasms.     hydrochlorothiazide 25 MG tablet  Commonly known as:  HYDRODIURIL  Take 1 tablet (25 mg total) by mouth daily.     ondansetron 4 MG disintegrating tablet  Commonly known as:  ZOFRAN ODT  Take 1 tablet (4 mg total) by mouth every 8 (eight)  hours as needed for nausea or vomiting.     potassium chloride SA 20 MEQ tablet  Commonly known as:  K-DUR,KLOR-CON  2 tabs by mouth today (02/14/15), then one tab by mouth once daily.     promethazine 25 MG suppository  Commonly known as:  PHENADOZ  INSERT ONE SUPPOSITORY RECTALLY AS NEEDED     SUMAtriptan 50 MG tablet  Commonly known as:  IMITREX  Take 1 tablet (50 mg total) by mouth every 2 (two) hours as needed for migraine. May repeat in 2 hours if headache persists or recurs. Max 2 tabs/24 hrs     traMADol 50 MG tablet  Commonly known as:  ULTRAM  TAKE ONE TABLET BY MOUTH EVERY 8 HOURS AS NEEDED        Allergies:  Allergies  Allergen Reactions  . Ace Inhibitors     REACTION: chronic cough  . Celebrex [Celecoxib] Other (See Comments)    "makes me bleed"  . Diovan [Valsartan]     angioedema    Past Medical History, Surgical history, Social history, and Family History were reviewed and updated.  Review of Systems: All other 10 point review of systems is negative.   Physical Exam:  vitals were not taken for this visit.  Wt Readings from Last 3 Encounters:  08/15/15 217 lb 9.6 oz (98.703 kg)  04/08/15 224 lb (101.606 kg)  02/11/15 218 lb 6.4 oz (99.066 kg)    Ocular: Sclerae unicteric, pupils equal, round and reactive to light Ear-nose-throat: Oropharynx clear, dentition fair Lymphatic: No cervical supraclavicular or axillary adenopathy Lungs no rales or rhonchi, good excursion bilaterally Heart regular rate and rhythm, no murmur appreciated Abd soft, nontender, positive bowel sounds, no liver or spleen tip palpated on exam MSK no focal spinal tenderness, no joint edema Neuro: non-focal, well-oriented, appropriate affect Breasts: Deferred  Lab Results  Component Value Date   WBC 5.7 08/16/2015   HGB 12.0 08/16/2015   HCT 37.9 08/16/2015   MCV 72* 08/16/2015   PLT 236 08/16/2015   Lab Results  Component Value Date   FERRITIN 981* 04/08/2015   IRON 93  04/08/2015   TIBC 248 04/08/2015   UIBC 155 04/08/2015   IRONPCTSAT 37 04/08/2015   Lab Results  Component Value Date   RETICCTPCT 1.0 04/08/2015   RBC 5.27 08/16/2015   RETICCTABS 48.5 04/08/2015   No results found for: Nils Pyle [facility] Lab Results  Component Value Date   IGA 305 05/04/2009   Lab Results  Component Value Date   TOTALPROTELP 7.2 05/04/2009   ALBUMINELP 49.1* 05/04/2009   A1GS 8.3* 05/04/2009   A2GS 14.1* 05/04/2009   BETS 6.6 05/04/2009   GAMS 16.4 05/04/2009   MSPIKE NOT DET 05/04/2009     Chemistry      Component Value Date/Time   NA 137 02/18/2015 1410   K 3.5 02/18/2015 1410   CL 101 02/18/2015 1410   CO2 27 02/18/2015 1410   BUN 15 02/18/2015 1410   CREATININE 0.76 02/18/2015 1410   CREATININE 0.74 10/02/2013 0821      Component Value Date/Time   CALCIUM 9.5 02/18/2015 1410   ALKPHOS 114 06/28/2014 1847   AST 34 06/28/2014 1847   ALT 41* 06/28/2014 1847   BILITOT 0.3 06/28/2014 1847     Impression and Plan: [name] is a very pleasant 55 year old African American female with recurrent iron deficiency anemia. She is currently recuperating from a stomach virus and feeling weakness and fatigue. This has been hard on her as her husband is in the hospital now due to the same virus and she has not been able to visit him for several days.  We will see what her iron studies show and bring her back in later this week for Feraheme if needed.  She will continue to rest and stay well hydrated with water and Gatorade.  We will go ahead and plan to see her back in 3 months for labs and follow-up.  She knows to call here with any questions or concerns. We can certainly see her sooner if need be.   Eliezer Bottom, NP 2/21/201711:32 AM

## 2015-08-16 NOTE — Telephone Encounter (Signed)
Critical Value Potassium 3.0 Dr Marin Olp notified. No orders received.

## 2015-08-17 LAB — ERYTHROPOIETIN: Erythropoietin: 5.4 m[IU]/mL (ref 2.6–18.5)

## 2015-08-17 LAB — RETICULOCYTES: Reticulocyte Count: 0.8 % (ref 0.6–2.6)

## 2015-09-26 ENCOUNTER — Other Ambulatory Visit: Payer: Medicare Other

## 2015-09-27 ENCOUNTER — Other Ambulatory Visit (INDEPENDENT_AMBULATORY_CARE_PROVIDER_SITE_OTHER): Payer: Medicare Other

## 2015-09-27 ENCOUNTER — Telehealth: Payer: Self-pay | Admitting: Family

## 2015-09-27 DIAGNOSIS — E785 Hyperlipidemia, unspecified: Secondary | ICD-10-CM | POA: Diagnosis not present

## 2015-09-27 LAB — LIPID PANEL
Cholesterol: 299 mg/dL — ABNORMAL HIGH (ref 0–200)
HDL: 47.7 mg/dL (ref >39.00)
LDL Cholesterol: 220 mg/dL — ABNORMAL HIGH (ref 0–99)
NonHDL: 251.03
Total CHOL/HDL Ratio: 6
Triglycerides: 153 mg/dL — ABNORMAL HIGH (ref 0.0–149.0)
VLDL: 30.6 mg/dL (ref 0.0–40.0)

## 2015-09-27 MED ORDER — LORAZEPAM 0.5 MG PO TABS: 0.5000 mg | ORAL_TABLET | Freq: Every evening | ORAL | Status: DC | PRN

## 2015-09-27 MED ORDER — TRAMADOL HCL 50 MG PO TABS: ORAL_TABLET | ORAL | Status: DC

## 2015-09-27 MED ORDER — ROSUVASTATIN CALCIUM 20 MG PO TABS: 20.0000 mg | ORAL_TABLET | Freq: Every day | ORAL | Status: DC

## 2015-09-27 NOTE — Telephone Encounter (Signed)
Please let pt know that I reviewed her cholesterol.  It is very elevated.  D/c lipitor, start crestor. Repeat lipids in weeks.

## 2015-09-27 NOTE — Telephone Encounter (Signed)
Left detailed message on voicemail that Tramadol Rx was sent to pharmacy and lorazepam was being prescribed to help her sleep and to call if any questions.

## 2015-09-27 NOTE — Telephone Encounter (Signed)
°  Relation to PO:[id number] Call back number:[phone] Pharmacy:[email or web address]  Reason for call: pt is needing rx traMADol (ULTRAM) 50 MG tablet , also pt would like to get something to help calm her nerves states she needs a muscle relaxer since her husband death she having trouble sleeping.

## 2015-09-27 NOTE — Telephone Encounter (Signed)
Rx faxed to pharmacy  

## 2015-09-28 NOTE — Telephone Encounter (Signed)
Notified pt and scheduled lab appt for 11/16/15 at [place], future order entered.

## 2015-10-11 ENCOUNTER — Other Ambulatory Visit: Payer: Self-pay | Admitting: Family

## 2015-11-16 ENCOUNTER — Other Ambulatory Visit (INDEPENDENT_AMBULATORY_CARE_PROVIDER_SITE_OTHER): Payer: Medicare Other

## 2015-11-16 ENCOUNTER — Encounter: Payer: Self-pay | Admitting: Family

## 2015-11-16 ENCOUNTER — Other Ambulatory Visit (HOSPITAL_BASED_OUTPATIENT_CLINIC_OR_DEPARTMENT_OTHER): Payer: Medicare Other

## 2015-11-16 ENCOUNTER — Ambulatory Visit (HOSPITAL_BASED_OUTPATIENT_CLINIC_OR_DEPARTMENT_OTHER): Payer: Medicare Other | Admitting: Family

## 2015-11-16 VITALS — BP 110/78 | HR 70 | Temp 98.4°F | Resp 16 | Ht 64.0 in | Wt 214.0 lb

## 2015-11-16 DIAGNOSIS — D509 Iron deficiency anemia, unspecified: Secondary | ICD-10-CM | POA: Diagnosis not present

## 2015-11-16 DIAGNOSIS — E785 Hyperlipidemia, unspecified: Secondary | ICD-10-CM

## 2015-11-16 DIAGNOSIS — M545 Low back pain: Secondary | ICD-10-CM | POA: Diagnosis not present

## 2015-11-16 LAB — COMPREHENSIVE METABOLIC PANEL
ALT: 28 U/L (ref 0–55)
AST: 22 U/L (ref 5–34)
Albumin: 3.6 g/dL (ref 3.5–5.0)
Alkaline Phosphatase: 135 U/L (ref 40–150)
Anion Gap: 10 mEq/L (ref 3–11)
BUN: 11.7 mg/dL (ref 7.0–26.0)
CO2: 27 mEq/L (ref 22–29)
Calcium: 9.4 mg/dL (ref 8.4–10.4)
Chloride: 101 mEq/L (ref 98–109)
Creatinine: 0.8 mg/dL (ref 0.6–1.1)
EGFR: >90 mL/min/{1.73_m2} (ref >90)
Glucose: 109 mg/dl (ref 70–140)
Potassium: 3.4 mEq/L — ABNORMAL LOW (ref 3.5–5.1)
Sodium: 139 mEq/L (ref 136–145)
Total Bilirubin: 0.39 mg/dL (ref 0.20–1.20)
Total Protein: 7.7 g/dL (ref 6.4–8.3)

## 2015-11-16 LAB — CBC WITH DIFFERENTIAL (CANCER CENTER ONLY)
BASO#: 0 10*3/uL (ref 0.0–0.2)
BASO%: 0.2 % (ref 0.0–2.0)
EOS%: 0.7 % (ref 0.0–7.0)
Eosinophils Absolute: 0 10*3/uL (ref 0.0–0.5)
HCT: 37.2 % (ref 34.8–46.6)
HGB: 11.9 g/dL (ref 11.6–15.9)
LYMPH#: 1.8 10*3/uL (ref 0.9–3.3)
LYMPH%: 30.4 % (ref 14.0–48.0)
MCH: 23.1 pg — ABNORMAL LOW (ref 26.0–34.0)
MCHC: 32 g/dL (ref 32.0–36.0)
MCV: 72 fL — ABNORMAL LOW (ref 81–101)
MONO#: 0.5 10*3/uL (ref 0.1–0.9)
MONO%: 8 % (ref 0.0–13.0)
NEUT#: 3.5 10*3/uL (ref 1.5–6.5)
NEUT%: 60.7 % (ref 39.6–80.0)
Platelets: 211 10*3/uL (ref 145–400)
RBC: 5.15 10*6/uL (ref 3.70–5.32)
RDW: 14.2 % (ref 11.1–15.7)
WBC: 5.8 10*3/uL (ref 3.9–10.0)

## 2015-11-16 LAB — LIPID PANEL
Cholesterol: 180 mg/dL (ref 0–200)
HDL: 35.5 mg/dL — ABNORMAL LOW (ref >39.00)
LDL Cholesterol: 122 mg/dL — ABNORMAL HIGH (ref 0–99)
NonHDL: 144.12
Total CHOL/HDL Ratio: 5
Triglycerides: 111 mg/dL (ref 0.0–149.0)
VLDL: 22.2 mg/dL (ref 0.0–40.0)

## 2015-11-16 LAB — IRON AND TIBC
%SAT: 19 % — ABNORMAL LOW (ref 21–57)
Iron: 50 ug/dL (ref 41–142)
TIBC: 256 ug/dL (ref 236–444)
UIBC: 207 ug/dL (ref 120–384)

## 2015-11-16 LAB — FERRITIN: Ferritin: 1532 ng/ml — ABNORMAL HIGH (ref 9–269)

## 2015-11-16 LAB — RETICULOCYTES: Reticulocyte Count: 1 % (ref 0.6–2.6)

## 2015-11-16 NOTE — Progress Notes (Signed)
Hematology and Oncology Follow Up Visit  [name] [id number] 1960-07-22 55 y.o. 11/16/2015   Principle Diagnosis:  Recurrent iron deficiency anemia  Current Therapy:   IV iron as indicated - last dose in February 2016    Interim History: [name] is here today for follow-up. She is just doing fairly well. Unfortunately her husband passed away in 2022/09/06 and her family has been a good support system for her.  Her energy is slightly improved. Iron studies in February were stable. She has not required an infusion in over a year.  No fever, chills, n/v, cough, rash, headache, dizziness, SOB, chest pain, palpitations, abdominal pain or changes in bowel or bladder habits. No change with her chronic back pain or occasional numbness and tingling in the fingers in her right hand. No swelling in her extremities.  Her appetite comes and goes but she is making sure to stay well hydrated. Her weight is unchanged.  She is staying busy and planning a trip to the casinos in [place].   Medications:    Medication List       This list is accurate as of: 11/16/15  9:38 AM.  Always use your most recent med list.               amLODipine 5 MG tablet  Commonly known as:  NORVASC  TAKE ONE TABLET BY MOUTH ONCE DAILY     aspirin EC 81 MG tablet  Take 81 mg by mouth daily.     cyclobenzaprine 5 MG tablet  Commonly known as:  FLEXERIL  Take 1 tablet (5 mg total) by mouth 3 (three) times daily as needed for muscle spasms.     hydrochlorothiazide 25 MG tablet  Commonly known as:  HYDRODIURIL  Take 1 tablet (25 mg total) by mouth daily.     LORazepam 0.5 MG tablet  Commonly known as:  ATIVAN  Take 1 tablet (0.5 mg total) by mouth at bedtime as needed for anxiety.     ondansetron 4 MG disintegrating tablet  Commonly known as:  ZOFRAN ODT  Take 1 tablet (4 mg total) by mouth every 8 (eight) hours as needed for nausea or vomiting.     potassium chloride SA 20 MEQ tablet  Commonly known  as:  K-DUR,KLOR-CON  2 tabs by mouth today (02/14/15), then one tab by mouth once daily.     promethazine 25 MG suppository  Commonly known as:  PHENADOZ  INSERT ONE SUPPOSITORY RECTALLY AS NEEDED     rosuvastatin 20 MG tablet  Commonly known as:  CRESTOR  Take 1 tablet (20 mg total) by mouth daily.     SUMAtriptan 50 MG tablet  Commonly known as:  IMITREX  Take 1 tablet (50 mg total) by mouth every 2 (two) hours as needed for migraine. May repeat in 2 hours if headache persists or recurs. Max 2 tabs/24 hrs     traMADol 50 MG tablet  Commonly known as:  ULTRAM  TAKE ONE TABLET BY MOUTH EVERY 8 HOURS AS NEEDED        Allergies:  Allergies  Allergen Reactions  . Ace Inhibitors     REACTION: chronic cough  . Celebrex [Celecoxib] Other (See Comments)    "makes me bleed"  . Diovan [Valsartan]     angioedema    Past Medical History, Surgical history, Social history, and Family History were reviewed and updated.  Review of Systems: All other 10 point review of systems is negative.  Physical Exam:  height is 5\' 4"  (1.626 m) and weight is 214 lb (97.07 kg). Her oral temperature is 98.4 F (36.9 C). Her blood pressure is 110/78 and her pulse is 70. Her respiration is 16.   Wt Readings from Last 3 Encounters:  11/16/15 214 lb (97.07 kg)  08/16/15 215 lb (97.523 kg)  08/15/15 217 lb 9.6 oz (98.703 kg)    Ocular: Sclerae unicteric, pupils equal, round and reactive to light Ear-nose-throat: Oropharynx clear, dentition fair Lymphatic: No cervical supraclavicular or axillary adenopathy Lungs no rales or rhonchi, good excursion bilaterally Heart regular rate and rhythm, no murmur appreciated Abd soft, nontender, positive bowel sounds, no liver or spleen tip palpated on exam, no fluid wave  MSK no focal spinal tenderness, no joint edema Neuro: non-focal, well-oriented, appropriate affect Breasts: Deferred  Lab Results  Component Value Date   WBC 5.8 11/16/2015   HGB 11.9  11/16/2015   HCT 37.2 11/16/2015   MCV 72* 11/16/2015   PLT 211 11/16/2015   Lab Results  Component Value Date   FERRITIN 1,938* 08/16/2015   IRON 55 08/16/2015   TIBC 239 08/16/2015   UIBC 183 08/16/2015   IRONPCTSAT 23 08/16/2015   Lab Results  Component Value Date   RETICCTPCT 1.0 04/08/2015   RBC 5.15 11/16/2015   RETICCTABS 48.5 04/08/2015   No results found for: Nils Pyle [facility] Lab Results  Component Value Date   IGA 305 05/04/2009   Lab Results  Component Value Date   TOTALPROTELP 7.2 05/04/2009   ALBUMINELP 49.1* 05/04/2009   A1GS 8.3* 05/04/2009   A2GS 14.1* 05/04/2009   BETS 6.6 05/04/2009   GAMS 16.4 05/04/2009   MSPIKE NOT DET 05/04/2009     Chemistry      Component Value Date/Time   NA 130 08/16/2015 1055   NA 137 02/18/2015 1410   K 3.0* 08/16/2015 1055   K 3.5 02/18/2015 1410   CL 99 08/16/2015 1055   CL 101 02/18/2015 1410   CO2 30 08/16/2015 1055   CO2 27 02/18/2015 1410   BUN 14 08/16/2015 1055   BUN 15 02/18/2015 1410   CREATININE 1.0 08/16/2015 1055   CREATININE 0.76 02/18/2015 1410      Component Value Date/Time   CALCIUM 9.2 08/16/2015 1055   CALCIUM 9.5 02/18/2015 1410   ALKPHOS 137* 08/16/2015 1055   ALKPHOS 114 06/28/2014 1847   AST 32 08/16/2015 1055   AST 34 06/28/2014 1847   ALT 37 08/16/2015 1055   ALT 41* 06/28/2014 1847   BILITOT 0.60 08/16/2015 1055   BILITOT 0.3 06/28/2014 1847     Impression and Plan: [name] is a very pleasant 55 yo African American female with recurrent iron deficiency anemia. She is doing well and is asymptomatic at this time.  We will see what her iron studies show and bring her back in later this week for Feraheme if needed.  We will go ahead and plan to see her back in 4 months for labs and follow-up.  She will contact our office with any questions or concerns. We can certainly see her sooner if need be.   Eliezer Bottom, NP 5/24/20179:38 AM

## 2015-11-17 ENCOUNTER — Other Ambulatory Visit: Payer: Self-pay | Admitting: *Deleted

## 2015-11-17 DIAGNOSIS — D509 Iron deficiency anemia, unspecified: Secondary | ICD-10-CM

## 2015-11-17 LAB — ERYTHROPOIETIN: Erythropoietin: 8.7 m[IU]/mL (ref 2.6–18.5)

## 2015-11-18 ENCOUNTER — Ambulatory Visit (HOSPITAL_BASED_OUTPATIENT_CLINIC_OR_DEPARTMENT_OTHER): Payer: Medicare Other

## 2015-11-18 VITALS — BP 120/76 | HR 63 | Temp 98.0°F | Resp 20

## 2015-11-18 DIAGNOSIS — D509 Iron deficiency anemia, unspecified: Secondary | ICD-10-CM

## 2015-11-18 MED ORDER — SODIUM CHLORIDE 0.9 % IV SOLN
Freq: Once | INTRAVENOUS | Status: AC
  Administered 2015-11-18: 10:00:00 via INTRAVENOUS

## 2015-11-18 MED ORDER — SODIUM CHLORIDE 0.9 % IV SOLN
510.0000 mg | Freq: Once | INTRAVENOUS | Status: AC
  Administered 2015-11-18: 510 mg via INTRAVENOUS
  Filled 2015-11-18: qty 17

## 2015-11-18 NOTE — Patient Instructions (Signed)

## 2016-01-13 ENCOUNTER — Telehealth: Payer: Self-pay

## 2016-01-13 NOTE — Telephone Encounter (Signed)
Patient welcome to medicare scheduled with the nurse for 03/12/2016

## 2016-01-13 NOTE — Telephone Encounter (Signed)
Noted, thank you

## 2016-01-13 NOTE — Telephone Encounter (Signed)
Patient scheduled with the Nurse Practitioner

## 2016-01-13 NOTE — Telephone Encounter (Signed)
Patient is on the list for [employer] 2017 and may be a good candidate for an AWV in 2017. Please let me know if/when appt is scheduled.   

## 2016-01-13 NOTE — Telephone Encounter (Signed)
If this is patient's first year on Medicare, pt needs AWV scheduled with provider.

## 2016-01-30 ENCOUNTER — Telehealth: Payer: Self-pay | Admitting: *Deleted

## 2016-01-30 MED ORDER — SUMATRIPTAN SUCCINATE 50 MG PO TABS: 50.0000 mg | ORAL_TABLET | ORAL | 2 refills | Status: DC | PRN

## 2016-01-30 NOTE — Telephone Encounter (Signed)
Refill sent for imitrex.

## 2016-02-01 ENCOUNTER — Telehealth: Payer: Self-pay | Admitting: Family

## 2016-02-01 NOTE — Telephone Encounter (Signed)
Pt has been scheduled for 02/02/16 at 9:30

## 2016-02-01 NOTE — Telephone Encounter (Signed)
Needs OV please so we can send urine for culture please.

## 2016-02-01 NOTE — Telephone Encounter (Signed)
Pt says that she has a uti. Pt says that she is having some pain when urinating and also her urine is cloudy. Pt says that she has had a few and is familiar with the feeling. Pt would like to know if PCP could call her in a Rx to the pharmacy?      CBHI:[phone]  Pharmacy: [facility] [place] [facility] [address]

## 2016-02-02 ENCOUNTER — Ambulatory Visit (INDEPENDENT_AMBULATORY_CARE_PROVIDER_SITE_OTHER): Payer: Medicare Other | Admitting: Family

## 2016-02-02 ENCOUNTER — Encounter: Payer: Self-pay | Admitting: Family

## 2016-02-02 VITALS — BP 131/77 | HR 52 | Temp 98.2°F | Ht 64.0 in | Wt 215.0 lb

## 2016-02-02 DIAGNOSIS — E785 Hyperlipidemia, unspecified: Secondary | ICD-10-CM | POA: Diagnosis not present

## 2016-02-02 DIAGNOSIS — I10 Essential (primary) hypertension: Secondary | ICD-10-CM

## 2016-02-02 DIAGNOSIS — R300 Dysuria: Secondary | ICD-10-CM | POA: Diagnosis not present

## 2016-02-02 DIAGNOSIS — G43809 Other migraine, not intractable, without status migrainosus: Secondary | ICD-10-CM

## 2016-02-02 LAB — POCT URINALYSIS DIPSTICK
Bilirubin, UA: NEGATIVE
Blood, UA: NEGATIVE
Glucose, UA: NEGATIVE
Ketones, UA: NEGATIVE
Leukocytes, UA: NEGATIVE
Nitrite, UA: NEGATIVE
Protein, UA: NEGATIVE
Spec Grav, UA: >=1.03
Urobilinogen, UA: 4
pH, UA: 6

## 2016-02-02 MED ORDER — NITROFURANTOIN MONOHYD MACRO 100 MG PO CAPS: 100.0000 mg | ORAL_CAPSULE | Freq: Two times a day (BID) | ORAL | 0 refills | Status: DC

## 2016-02-02 MED ORDER — AMITRIPTYLINE HCL 25 MG PO TABS: 25.0000 mg | ORAL_TABLET | Freq: Every day | ORAL | 2 refills | Status: DC

## 2016-02-02 NOTE — Assessment & Plan Note (Signed)
Uncontrolled, will add elavil.

## 2016-02-02 NOTE — Assessment & Plan Note (Signed)
Stable on crestor, continue same.  

## 2016-02-02 NOTE — Progress Notes (Signed)
Pre visit review using our clinic tool,if applicable. No additional management support is needed unless otherwise documented below in the visit note.  

## 2016-02-02 NOTE — Assessment & Plan Note (Signed)
BP stable on current meds. Continue same.  

## 2016-02-02 NOTE — Patient Instructions (Addendum)
Please begin macrobid for you urinary tract infection.   Add Elavil at bedtime- this will help with sleep and migraine prevention. You may still use imitrex as needed.

## 2016-02-02 NOTE — Progress Notes (Signed)
Subjective:    Patient ID: [name], female    DOB: 29-May-1961, 55 y.o.   MRN: [id number]  HPI  [name] is a 55 yr old female who presents today with chief complaint of dysuria.  Reports + vaginal itching.  Denies vaginal discharge.  + frequency.   HTN- maintained on hctz, amlodipine.  Denies cp, sob or swelling.  BP Readings from Last 3 Encounters:  02/02/16 131/77  11/18/15 120/76  11/16/15 110/78   Hyperlipidemia- on crestor.  Lab Results  Component Value Date   CHOL 180 11/16/2015   HDL 35.50 (L) 11/16/2015   LDLCALC 122 (H) 11/16/2015   LDLDIRECT 204.0 11/01/2014   TRIG 111.0 11/16/2015   CHOLHDL 5 11/16/2015   Migraines- has prn imitrex. Reports 3 migraines a week, imitrex does not help.   Review of Systems     Past Medical History:  Diagnosis Date  . Allergy    allergic rhinitis  . Atypical chest pain   . Constipation   . Cyst, ovarian   . Diabetes mellitus, type II ([place])   . Endometriosis   . Esophageal stricture   . Gastroparesis   . GERD (gastroesophageal reflux disease)   . Hyperlipidemia   . Hypertension   . 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 Iron deficiency anemia   . Migraine headache   . Non-compliance   . Peptic ulcer disease   . Routine general medical examination at a health care facility      Social History   Social History  . Marital status: Married    Spouse name: N/A  . Number of children: 2  . Years of education: N/A   Occupational History  . DISABILITY Unemployed   Social History Main Topics  . Smoking status: Former Smoker    Packs/day: 0.50    Years: 18.00    Types: Cigarettes    Start date: 07/29/1977    Quit date: 06/25/1994  . Smokeless tobacco: Never Used     Comment: quit 19 years ago  . Alcohol use 0.0 oz/week     Comment: social drinker  . Drug use: No  . Sexual activity: Not on file   Other Topics Concern  . Not on file   Social History  Narrative   Married    Has 2 grown children.  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   Former Smoker Quit tobacco in 1996.  She was a pack a day smoker for approximately 10 years.   Alcohol use-yes: Social    Daily Caffeine Use:6 pack of pepsi daily     Illicit Drug Use - no    Patient does not get regular exercise.       Smoking Status:  quit    Past Surgical History:  Procedure Laterality Date  . ABDOMINAL EXPLORATION SURGERY     w/bso   . CARDIAC CATHETERIZATION  2009   mild non obstructive CAD  . CHOLECYSTECTOMY    . KNEE SURGERY  2005    left knee  . TOTAL ABDOMINAL HYSTERECTOMY      Family History  Problem Relation Age of Onset  . Hypertension Mother   . Rheum arthritis Mother   . Hypertension Father   . Diabetes Father   . Prostate cancer Father   . Kidney disease Father   . Diabetes Sister   . Fibromyalgia Sister   . Diabetes Maternal Grandmother   . Colon cancer Neg  Hx   . Lung cancer Maternal Grandfather     Allergies  Allergen Reactions  . Ace Inhibitors     REACTION: chronic cough  . Celebrex [Celecoxib] Other (See Comments)    "makes me bleed"  . Diovan [Valsartan]     angioedema    Current Outpatient Prescriptions on File Prior to Visit  Medication Sig Dispense Refill  . amLODipine (NORVASC) 5 MG tablet TAKE ONE TABLET BY MOUTH ONCE DAILY 30 tablet 5  . aspirin EC 81 MG tablet Take 81 mg by mouth daily.    . cyclobenzaprine (FLEXERIL) 5 MG tablet Take 1 tablet (5 mg total) by mouth 3 (three) times daily as needed for muscle spasms. 20 tablet 0  . hydrochlorothiazide (HYDRODIURIL) 25 MG tablet Take 1 tablet (25 mg total) by mouth daily. 3090 tablet 1  . LORazepam (ATIVAN) 0.5 MG tablet Take 1 tablet (0.5 mg total) by mouth at bedtime as needed for anxiety. 30 tablet 0  . ondansetron (ZOFRAN ODT) 4 MG disintegrating tablet Take 1 tablet (4 mg total) by mouth every 8 (eight) hours as needed for nausea or vomiting. 20 tablet 0  . potassium chloride  SA (K-DUR,KLOR-CON) 20 MEQ tablet 2 tabs by mouth today (02/14/15), then one tab by mouth once daily. 30 tablet 0  . promethazine (PHENADOZ) 25 MG suppository INSERT ONE SUPPOSITORY RECTALLY AS NEEDED 12 suppository 0  . rosuvastatin (CRESTOR) 20 MG tablet Take 1 tablet (20 mg total) by mouth daily. 30 tablet 3  . SUMAtriptan (IMITREX) 50 MG tablet Take 1 tablet (50 mg total) by mouth every 2 (two) hours as needed for migraine. May repeat in 2 hours if headache persists or recurs. Max 2 tabs/24 hrs 10 tablet 2  . traMADol (ULTRAM) 50 MG tablet TAKE ONE TABLET BY MOUTH EVERY 8 HOURS AS NEEDED 90 tablet 0  . [DISCONTINUED] atorvastatin (LIPITOR) 40 MG tablet Take 1 tablet (40 mg total) by mouth daily. 30 tablet 3  . [DISCONTINUED] famotidine (PEPCID) 20 MG tablet Take 1 tablet (20 mg total) by mouth 2 (two) times daily. 30 tablet 0   No current facility-administered medications on file prior to visit.     BP 131/77   Pulse (!) 52   Temp 98.2 F (36.8 C) (Oral)   Ht 5\' 4"  (1.626 m)   Wt 215 lb (97.5 kg)   SpO2 100%   BMI 36.90 kg/m    Objective:   Physical Exam  Constitutional: She is oriented to person, place, and time. She appears well-developed and well-nourished.  Cardiovascular: Normal rate, regular rhythm and normal heart sounds.   No murmur heard. Pulmonary/Chest: Effort normal and breath sounds normal. No respiratory distress. She has no wheezes.  Musculoskeletal: She exhibits no edema.  Neurological: She is alert and oriented to person, place, and time.  Psychiatric: She has a normal mood and affect. Her behavior is normal. Judgment and thought content normal.          Assessment & Plan:  Dysuria- UA clear, will send for culture.  Begin Macrobid.

## 2016-02-22 ENCOUNTER — Other Ambulatory Visit: Payer: Self-pay | Admitting: Family

## 2016-03-12 ENCOUNTER — Ambulatory Visit: Payer: Medicare Other | Admitting: Family

## 2016-03-21 ENCOUNTER — Other Ambulatory Visit: Payer: Medicare Other

## 2016-03-21 ENCOUNTER — Ambulatory Visit: Payer: Medicare Other | Admitting: Family

## 2016-03-30 ENCOUNTER — Ambulatory Visit: Payer: Medicare Other | Admitting: Family

## 2016-03-30 ENCOUNTER — Telehealth: Payer: Self-pay | Admitting: *Deleted

## 2016-03-30 ENCOUNTER — Ambulatory Visit (HOSPITAL_BASED_OUTPATIENT_CLINIC_OR_DEPARTMENT_OTHER): Payer: Medicare Other | Admitting: Family

## 2016-03-30 ENCOUNTER — Other Ambulatory Visit: Payer: Medicare Other

## 2016-03-30 ENCOUNTER — Other Ambulatory Visit (HOSPITAL_BASED_OUTPATIENT_CLINIC_OR_DEPARTMENT_OTHER): Payer: Medicare Other

## 2016-03-30 VITALS — BP 141/79 | HR 62 | Temp 98.1°F | Resp 18 | Wt 219.1 lb

## 2016-03-30 DIAGNOSIS — R5383 Other fatigue: Secondary | ICD-10-CM

## 2016-03-30 DIAGNOSIS — D509 Iron deficiency anemia, unspecified: Secondary | ICD-10-CM

## 2016-03-30 DIAGNOSIS — M545 Low back pain: Secondary | ICD-10-CM | POA: Diagnosis not present

## 2016-03-30 DIAGNOSIS — D508 Other iron deficiency anemias: Secondary | ICD-10-CM

## 2016-03-30 LAB — CBC WITH DIFFERENTIAL (CANCER CENTER ONLY)
BASO#: 0 10*3/uL (ref 0.0–0.2)
BASO%: 0.3 % (ref 0.0–2.0)
EOS%: 1.4 % (ref 0.0–7.0)
Eosinophils Absolute: 0.1 10*3/uL (ref 0.0–0.5)
HCT: 37.5 % (ref 34.8–46.6)
HGB: 12 g/dL (ref 11.6–15.9)
LYMPH#: 3.1 10*3/uL (ref 0.9–3.3)
LYMPH%: 40.6 % (ref 14.0–48.0)
MCH: 23.4 pg — ABNORMAL LOW (ref 26.0–34.0)
MCHC: 32 g/dL (ref 32.0–36.0)
MCV: 73 fL — ABNORMAL LOW (ref 81–101)
MONO#: 0.6 10*3/uL (ref 0.1–0.9)
MONO%: 7.1 % (ref 0.0–13.0)
NEUT#: 3.9 10*3/uL (ref 1.5–6.5)
NEUT%: 50.6 % (ref 39.6–80.0)
Platelets: 203 10*3/uL (ref 145–400)
RBC: 5.13 10*6/uL (ref 3.70–5.32)
RDW: 14.3 % (ref 11.1–15.7)
WBC: 7.7 10*3/uL (ref 3.9–10.0)

## 2016-03-30 LAB — COMPREHENSIVE METABOLIC PANEL
ALT: 26 U/L (ref 0–55)
AST: 18 U/L (ref 5–34)
Albumin: 3.6 g/dL (ref 3.5–5.0)
Alkaline Phosphatase: 139 U/L (ref 40–150)
Anion Gap: 11 mEq/L (ref 3–11)
BUN: 15.5 mg/dL (ref 7.0–26.0)
CO2: 25 mEq/L (ref 22–29)
Calcium: 9.4 mg/dL (ref 8.4–10.4)
Chloride: 103 mEq/L (ref 98–109)
Creatinine: 0.8 mg/dL (ref 0.6–1.1)
EGFR: >90 mL/min/{1.73_m2} (ref >90)
Glucose: 109 mg/dl (ref 70–140)
Potassium: 3.6 mEq/L (ref 3.5–5.1)
Sodium: 139 mEq/L (ref 136–145)
Total Bilirubin: <0.3 mg/dL (ref 0.20–1.20)
Total Protein: 7.8 g/dL (ref 6.4–8.3)

## 2016-03-30 LAB — FERRITIN: Ferritin: 1104 ng/ml — ABNORMAL HIGH (ref 9–269)

## 2016-03-30 LAB — IRON AND TIBC
%SAT: 29 % (ref 21–57)
Iron: 78 ug/dL (ref 41–142)
TIBC: 266 ug/dL (ref 236–444)
UIBC: 188 ug/dL (ref 120–384)

## 2016-03-30 NOTE — Progress Notes (Signed)
Hematology and Oncology Follow Up Visit  [name] [id number] [birth year] 55 y.o. 03/30/2016   Principle Diagnosis:  Recurrent iron deficiency anemia  Current Therapy:   IV iron as indicated - last dose in May 2017    Interim History: [name] is here today for follow-up. She is symptomatic at this time with fatigue. She has chronic lower back pain due to bulging discs at L4-L5 and L5-S1.   Her Hg today is 12.0 with an MCV of 73. She last received Feraheme in May for an iron saturation of 19%.  No fever, chills, cough, rash, headache, dizziness, SOB, chest pain, palpitations, abdominal pain or changes in bowel or bladder habits. She has gastroparesis which causes her to have occasional nausea. No vomiting.  She also has bouts with constipation and uses Miralax as needed.  No change with her chronic back pain or occasional numbness and tingling in the fingers in her right hand. No swelling in her extremities.  No episodes of bleeding or bruising. No lymphadenopathy found on exam.  Her appetite still comes and goes. She is staying well hydrated. Her weight is up 4 lbs since her last visit.  She is staying busy with her family and babysitting her grandson.   Medications:    Medication List       Accurate as of 03/30/16  9:41 AM. Always use your most recent med list.          amitriptyline 25 MG tablet Commonly known as:  ELAVIL Take 1 tablet (25 mg total) by mouth at bedtime.   amLODipine 5 MG tablet Commonly known as:  NORVASC TAKE ONE TABLET BY MOUTH ONCE DAILY   aspirin EC 81 MG tablet Take 81 mg by mouth daily.   cyclobenzaprine 5 MG tablet Commonly known as:  FLEXERIL Take 1 tablet (5 mg total) by mouth 3 (three) times daily as needed for muscle spasms.   hydrochlorothiazide 25 MG tablet Commonly known as:  HYDRODIURIL Take 1 tablet (25 mg total) by mouth daily.   nitrofurantoin (macrocrystal-monohydrate) 100 MG capsule Commonly known as:  MACROBID Take  1 capsule (100 mg total) by mouth 2 (two) times daily.   ondansetron 4 MG disintegrating tablet Commonly known as:  ZOFRAN ODT Take 1 tablet (4 mg total) by mouth every 8 (eight) hours as needed for nausea or vomiting.   potassium chloride SA 20 MEQ tablet Commonly known as:  K-DUR,KLOR-CON 2 tabs by mouth today (02/14/15), then one tab by mouth once daily.   promethazine 25 MG suppository Commonly known as:  PHENADOZ INSERT ONE SUPPOSITORY RECTALLY AS NEEDED   rosuvastatin 20 MG tablet Commonly known as:  CRESTOR TAKE ONE TABLET BY MOUTH ONCE DAILY   SUMAtriptan 50 MG tablet Commonly known as:  IMITREX Take 1 tablet (50 mg total) by mouth every 2 (two) hours as needed for migraine. May repeat in 2 hours if headache persists or recurs. Max 2 tabs/24 hrs       Allergies:  Allergies  Allergen Reactions  . Ace Inhibitors     REACTION: chronic cough  . Celebrex [Celecoxib] Other (See Comments)    "makes me bleed"  . Diovan [Valsartan]     angioedema    Past Medical History, Surgical history, Social history, and Family History were reviewed and updated.  Review of Systems: All other 10 point review of systems is negative.   Physical Exam:  weight is 219 lb 1.9 oz (99.4 kg). Her oral temperature is 98.1 F (  36.7 C). Her blood pressure is 141/79 (abnormal) and her pulse is 62. Her respiration is 18.   Wt Readings from Last 3 Encounters:  03/30/16 219 lb 1.9 oz (99.4 kg)  02/02/16 215 lb (97.5 kg)  11/16/15 214 lb (97.1 kg)    Ocular: Sclerae unicteric, pupils equal, round and reactive to light Ear-nose-throat: Oropharynx clear, dentition fair Lymphatic: No cervical supraclavicular or axillary adenopathy Lungs no rales or rhonchi, good excursion bilaterally Heart regular rate and rhythm, no murmur appreciated Abd soft, nontender, positive bowel sounds, no liver or spleen tip palpated on exam, no fluid wave  MSK no focal spinal tenderness, no joint edema Neuro:  non-focal, well-oriented, appropriate affect Breasts: Deferred  Lab Results  Component Value Date   WBC 7.7 03/30/2016   HGB 12.0 03/30/2016   HCT 37.5 03/30/2016   MCV 73 (L) 03/30/2016   PLT 203 03/30/2016   Lab Results  Component Value Date   FERRITIN 1,532 (H) 11/16/2015   IRON 50 11/16/2015   TIBC 256 11/16/2015   UIBC 207 11/16/2015   IRONPCTSAT 19 (L) 11/16/2015   Lab Results  Component Value Date   RETICCTPCT 1.0 04/08/2015   RBC 5.13 03/30/2016   RETICCTABS 48.5 04/08/2015   No results found for: Nils Pyle [facility] Lab Results  Component Value Date   IGA 305 05/04/2009   Lab Results  Component Value Date   TOTALPROTELP 7.2 05/04/2009   ALBUMINELP 49.1 (L) 05/04/2009   A1GS 8.3 (H) 05/04/2009   A2GS 14.1 (H) 05/04/2009   BETS 6.6 05/04/2009   GAMS 16.4 05/04/2009   MSPIKE NOT DET 05/04/2009     Chemistry      Component Value Date/Time   NA 139 11/16/2015 0914   K 3.4 (L) 11/16/2015 0914   CL 99 08/16/2015 1055   CO2 27 11/16/2015 0914   BUN 11.7 11/16/2015 0914   CREATININE 0.8 11/16/2015 0914      Component Value Date/Time   CALCIUM 9.4 11/16/2015 0914   ALKPHOS 135 11/16/2015 0914   AST 22 11/16/2015 0914   ALT 28 11/16/2015 0914   BILITOT 0.39 11/16/2015 0914     Impression and Plan: [name] is a very pleasant 55 yo African American female with recurrent iron deficiency anemia. She is symptomatic with fatigue at this time. CBC looks good.  We will see what her iron studies show and bring her back in later this week for Feraheme if needed.  We will go ahead and plan to see her back in 4 months for labs and follow-up.  She will contact our office with any questions or concerns. We can certainly see her sooner if need be.   Eliezer Bottom, NP 10/6/20179:41 AM

## 2016-03-30 NOTE — Telephone Encounter (Addendum)
Message left on personal voice mail  ----- Message from Eliezer Bottom, NP sent at 03/30/2016  1:38 PM EDT ----- Regarding: Iron  Iron studies look good! No infusion needed at this time. Thank you!  SArah  ----- Message ----- From: Interface, Lab In Three Zero One Sent: 03/30/2016   9:39 AM To: Eliezer Bottom, NP

## 2016-03-31 LAB — RETICULOCYTES: Reticulocyte Count: 1.1 % (ref 0.6–2.6)

## 2016-04-03 ENCOUNTER — Ambulatory Visit: Payer: Medicare Other | Admitting: Family

## 2016-04-11 ENCOUNTER — Telehealth: Payer: Self-pay | Admitting: Family

## 2016-04-11 NOTE — Telephone Encounter (Signed)
Spoke with pt and advised her she will need evaluation in the office first. She states she already scheduled appt for Friday. States she is starting to have some ear pain. Advised her to call for appt tomorrow if symptoms worsen and she voices understanding.

## 2016-04-11 NOTE — Telephone Encounter (Signed)
Relation to [id number] Call back [phone] [facility]: [facility] [place] [address] [id number] (Phone) [phone] (Fax)     Reason for call:  Patient experiencing cough, stuffy nose, congestion, patient states she's been taking alkaser plus & Nyquil no improvement.   Patient advised to schedule appointment, next available is not until Friday. Offered another provider patient denied. Patient requesting Rx please advise

## 2016-04-13 ENCOUNTER — Encounter: Payer: Self-pay | Admitting: Family

## 2016-04-13 ENCOUNTER — Other Ambulatory Visit: Payer: Self-pay

## 2016-04-13 ENCOUNTER — Ambulatory Visit (INDEPENDENT_AMBULATORY_CARE_PROVIDER_SITE_OTHER): Payer: Medicare Other | Admitting: Family

## 2016-04-13 VITALS — BP 136/70 | HR 78 | Temp 98.5°F | Ht 64.0 in | Wt 223.2 lb

## 2016-04-13 DIAGNOSIS — H6691 Otitis media, unspecified, right ear: Secondary | ICD-10-CM

## 2016-04-13 MED ORDER — HYDROCHLOROTHIAZIDE 25 MG PO TABS: 25.0000 mg | ORAL_TABLET | Freq: Every day | ORAL | 1 refills | Status: DC

## 2016-04-13 MED ORDER — AMOXICILLIN 500 MG PO CAPS: 500.0000 mg | ORAL_CAPSULE | Freq: Three times a day (TID) | ORAL | 0 refills | Status: DC

## 2016-04-13 MED ORDER — AMLODIPINE BESYLATE 5 MG PO TABS: 5.0000 mg | ORAL_TABLET | Freq: Every day | ORAL | 1 refills | Status: DC

## 2016-04-13 NOTE — Patient Instructions (Signed)
Please begin amoxicillin for your right sided ear infection.  Call if new/worsening symptoms or if you are not feeling better in 3 days.

## 2016-04-13 NOTE — Progress Notes (Signed)
Pre visit review using our clinic review tool, if applicable. No additional management support is needed unless otherwise documented below in the visit note. 

## 2016-04-13 NOTE — Progress Notes (Signed)
Subjective:    Patient ID: [name], female    DOB: [birth year], 55 y.o.   MRN: [id number]  HPI  [name] is a 55 yr old female who presents today with c/o cough, mild throat irritation, chest congestion and ear fullness x 4 days. Denies associated fever.  Symptoms started about 4 days ago.  Watches her grandson who has had uri.    Review of Systems See HPI  Past Medical History:  Diagnosis Date  . Allergy    allergic rhinitis  . Atypical chest pain   . Constipation   . Cyst, ovarian   . Diabetes mellitus, type II ([place])   . Endometriosis   . Esophageal stricture   . Gastroparesis   . GERD (gastroesophageal reflux disease)   . Hyperlipidemia   . Hypertension   . 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 Iron deficiency anemia   . Migraine headache   . Non-compliance   . Peptic ulcer disease   . Routine general medical examination at a health care facility      Social History   Social History  . Marital status: Married    Spouse name: N/A  . Number of children: 2  . Years of education: N/A   Occupational History  . DISABILITY Unemployed   Social History Main Topics  . Smoking status: Former Smoker    Packs/day: 0.50    Years: 18.00    Types: Cigarettes    Start date: 07/29/1977    Quit date: 06/25/1994  . Smokeless tobacco: Never Used     Comment: quit 19 years ago  . Alcohol use 0.0 oz/week     Comment: social drinker  . Drug use: No  . Sexual activity: Not on file   Other Topics Concern  . Not on file   Social History Narrative   Married    Has 2 grown children.  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   Former Smoker Quit tobacco in 1996.  She was a pack a day smoker for approximately 10 years.   Alcohol use-yes: Social    Daily Caffeine Use:6 pack of pepsi daily     Illicit Drug Use - no    Patient does not get regular exercise.       Smoking Status:  quit    Past Surgical History:    Procedure Laterality Date  . ABDOMINAL EXPLORATION SURGERY     w/bso   . CARDIAC CATHETERIZATION  2009   mild non obstructive CAD  . CHOLECYSTECTOMY    . KNEE SURGERY  2005    left knee  . TOTAL ABDOMINAL HYSTERECTOMY      Family History  Problem Relation Age of Onset  . Hypertension Mother   . Rheum arthritis Mother   . Hypertension Father   . Diabetes Father   . Prostate cancer Father   . Kidney disease Father   . Diabetes Sister   . Fibromyalgia Sister   . Diabetes Maternal Grandmother   . Colon cancer Neg Hx   . Lung cancer Maternal Grandfather     Allergies  Allergen Reactions  . Ace Inhibitors     REACTION: chronic cough  . Celebrex [Celecoxib] Other (See Comments)    "makes me bleed"  . Diovan [Valsartan]     angioedema    Current Outpatient Prescriptions on File Prior to Visit  Medication Sig Dispense Refill  . amitriptyline (ELAVIL) 25 MG tablet  Take 1 tablet (25 mg total) by mouth at bedtime. 30 tablet 2  . aspirin EC 81 MG tablet Take 81 mg by mouth daily.    . cyclobenzaprine (FLEXERIL) 5 MG tablet Take 1 tablet (5 mg total) by mouth 3 (three) times daily as needed for muscle spasms. 20 tablet 0  . ondansetron (ZOFRAN ODT) 4 MG disintegrating tablet Take 1 tablet (4 mg total) by mouth every 8 (eight) hours as needed for nausea or vomiting. 20 tablet 0  . potassium chloride SA (K-DUR,KLOR-CON) 20 MEQ tablet 2 tabs by mouth today (02/14/15), then one tab by mouth once daily. 30 tablet 0  . promethazine (PHENADOZ) 25 MG suppository INSERT ONE SUPPOSITORY RECTALLY AS NEEDED 12 suppository 0  . rosuvastatin (CRESTOR) 20 MG tablet TAKE ONE TABLET BY MOUTH ONCE DAILY 60 tablet 1  . SUMAtriptan (IMITREX) 50 MG tablet Take 1 tablet (50 mg total) by mouth every 2 (two) hours as needed for migraine. May repeat in 2 hours if headache persists or recurs. Max 2 tabs/24 hrs 10 tablet 2  . [DISCONTINUED] atorvastatin (LIPITOR) 40 MG tablet Take 1 tablet (40 mg total) by  mouth daily. 30 tablet 3  . [DISCONTINUED] famotidine (PEPCID) 20 MG tablet Take 1 tablet (20 mg total) by mouth 2 (two) times daily. 30 tablet 0   No current facility-administered medications on file prior to visit.     BP 136/70 (BP Location: Left Arm, Patient Position: Sitting, Cuff Size: Normal)   Pulse 78   Temp 98.5 F (36.9 C) (Oral)   Ht 5\' 4"  (1.626 m)   Wt 223 lb 3.2 oz (101.2 kg)   SpO2 97%   BMI 38.31 kg/m       Objective:   Physical Exam  Constitutional: She is oriented to person, place, and time. She appears well-developed and well-nourished.  HENT:  Head: Normocephalic and atraumatic.  Bilateral cerumen impaction noted. Cerumen removed with irrigation.  Revealed mildly injected R TM and normal L TM.   Cardiovascular: Normal rate, regular rhythm and normal heart sounds.   No murmur heard. Pulmonary/Chest: Effort normal and breath sounds normal. No respiratory distress. She has no wheezes.  Neurological: She is alert and oriented to person, place, and time.  Psychiatric: She has a normal mood and affect. Her behavior is normal. Judgment and thought content normal.          Assessment & Plan:  Right Otitis Media- rx with amoxicillin. Advised pt to use mucinex as needed for congestion and to call if new/worsening symptoms or if you are not feeling better in 3 days.

## 2016-04-30 ENCOUNTER — Ambulatory Visit (INDEPENDENT_AMBULATORY_CARE_PROVIDER_SITE_OTHER): Payer: Medicare Other | Admitting: Family

## 2016-04-30 ENCOUNTER — Encounter: Payer: Self-pay | Admitting: Family

## 2016-04-30 VITALS — BP 145/90 | HR 77 | Temp 98.7°F | Resp 18 | Ht 64.0 in | Wt 221.0 lb

## 2016-04-30 DIAGNOSIS — R51 Headache: Secondary | ICD-10-CM

## 2016-04-30 DIAGNOSIS — R7303 Prediabetes: Secondary | ICD-10-CM | POA: Diagnosis not present

## 2016-04-30 DIAGNOSIS — E2839 Other primary ovarian failure: Secondary | ICD-10-CM | POA: Diagnosis not present

## 2016-04-30 DIAGNOSIS — Z0000 Encounter for general adult medical examination without abnormal findings: Secondary | ICD-10-CM

## 2016-04-30 DIAGNOSIS — I10 Essential (primary) hypertension: Secondary | ICD-10-CM

## 2016-04-30 DIAGNOSIS — R519 Headache, unspecified: Secondary | ICD-10-CM

## 2016-04-30 DIAGNOSIS — Z23 Encounter for immunization: Secondary | ICD-10-CM | POA: Diagnosis not present

## 2016-04-30 DIAGNOSIS — K3184 Gastroparesis: Secondary | ICD-10-CM | POA: Diagnosis not present

## 2016-04-30 DIAGNOSIS — G8929 Other chronic pain: Secondary | ICD-10-CM

## 2016-04-30 DIAGNOSIS — M545 Low back pain: Secondary | ICD-10-CM

## 2016-04-30 DIAGNOSIS — Z1239 Encounter for other screening for malignant neoplasm of breast: Secondary | ICD-10-CM

## 2016-04-30 DIAGNOSIS — E785 Hyperlipidemia, unspecified: Secondary | ICD-10-CM

## 2016-04-30 MED ORDER — KETOROLAC TROMETHAMINE 60 MG/2ML IM SOLN
60.0000 mg | Freq: Once | INTRAMUSCULAR | Status: AC
  Administered 2016-04-30: 60 mg via INTRAMUSCULAR

## 2016-04-30 NOTE — Progress Notes (Addendum)
Subjective:    [name] is a 55 y.o. female who presents for Medicare Annual/Subsequent preventive examination.  Preventive Screening-Counseling & Management  Tobacco History  Smoking Status  . Former Smoker  . Packs/day: 0.50  . Years: 18.00  . Types: Cigarettes  . Start date: 07/29/1977  . Quit date: 06/25/1994  Smokeless Tobacco  . Never Used    Comment: quit 19 years ago     Problems Prior to Visit   1.  Hyperlipidemia-  She reports increased muscle pain with crestor.  Lab Results  Component Value Date   CHOL 180 11/16/2015   HDL 35.50 (L) 11/16/2015   LDLCALC 122 (H) 11/16/2015   LDLDIRECT 204.0 11/01/2014   TRIG 111.0 11/16/2015   CHOLHDL 5 11/16/2015   2.  DM2- Diet controlled Lab Results  Component Value Date   HGBA1C 6.1 02/11/2015   HGBA1C 6.3 10/06/2014   HGBA1C 6.2 03/17/2014   Lab Results  Component Value Date   MICROALBUR <0.7 02/11/2015   LDLCALC 122 (H) 11/16/2015   CREATININE 0.8 03/30/2016   3.  HTN- Maintained on hctz and amlodipine.  BP Readings from Last 3 Encounters:  04/30/16 (!) 145/90  04/13/16 136/70  03/30/16 (!) 141/79   4.  Gastroparesis- reports some sense that "things get hung up" when she tires to swallow.    5.  She reports + low back pain.    Patient presents today for complete physical.  Immunizations:  Due for flu shot.  Diet: trying to eat healty Exercise: goes to [facility]- cardio class  Colonoscopy:2010 Dexa: due Pap Smear: hysterctomy Mammogram: due    Current Problems (verified) Patient Active Problem List   Diagnosis Date Noted  . Hyperglycemia 10/07/2014  . HTN (hypertension) 09/18/2012  . Breast cyst 06/09/2012  . Hyperthyroidism 02/29/2012  . Unspecified constipation 04/18/2011  . Can't get food down 04/18/2011  . Nausea with vomiting 04/18/2011  . Ulnar neuropathy 03/30/2011  . Low back pain 10/18/2010  . FIBROMYALGIA 07/20/2009  . Hyperlipidemia 05/26/2009  . VITAMIN D DEFICIENCY  05/23/2009  . Iron deficiency anemia 10/25/2008  . GERD 10/25/2008  . LIVER HEMANGIOMA 01/01/2008  . ESOPHAGEAL STRICTURE 01/01/2008  . GASTROPARESIS 01/01/2008  . ALLERGIC RHINITIS 12/08/2007  . Borderline type 2 diabetes mellitus 10/31/2007  . Migraine headache 08/11/2007  . HIATAL HERNIA 08/11/2007  . OVARIAN CYST 08/11/2007  . Essential hypertension, benign 05/06/2007    Medications Prior to Visit Current Outpatient Prescriptions on File Prior to Visit  Medication Sig Dispense Refill  . amitriptyline (ELAVIL) 25 MG tablet Take 1 tablet (25 mg total) by mouth at bedtime. 30 tablet 2  . amLODipine (NORVASC) 5 MG tablet Take 1 tablet (5 mg total) by mouth daily. 90 tablet 1  . amoxicillin (AMOXIL) 500 MG capsule Take 1 capsule (500 mg total) by mouth 3 (three) times daily. 30 capsule 0  . aspirin EC 81 MG tablet Take 81 mg by mouth daily.    . cyclobenzaprine (FLEXERIL) 5 MG tablet Take 1 tablet (5 mg total) by mouth 3 (three) times daily as needed for muscle spasms. 20 tablet 0  . hydrochlorothiazide (HYDRODIURIL) 25 MG tablet Take 1 tablet (25 mg total) by mouth daily. 90 tablet 1  . ondansetron (ZOFRAN ODT) 4 MG disintegrating tablet Take 1 tablet (4 mg total) by mouth every 8 (eight) hours as needed for nausea or vomiting. 20 tablet 0  . potassium chloride SA (K-DUR,KLOR-CON) 20 MEQ tablet 2 tabs by mouth today (02/14/15),  then one tab by mouth once daily. 30 tablet 0  . promethazine (PHENADOZ) 25 MG suppository INSERT ONE SUPPOSITORY RECTALLY AS NEEDED 12 suppository 0  . rosuvastatin (CRESTOR) 20 MG tablet TAKE ONE TABLET BY MOUTH ONCE DAILY 60 tablet 1  . SUMAtriptan (IMITREX) 50 MG tablet Take 1 tablet (50 mg total) by mouth every 2 (two) hours as needed for migraine. May repeat in 2 hours if headache persists or recurs. Max 2 tabs/24 hrs 10 tablet 2  . [DISCONTINUED] atorvastatin (LIPITOR) 40 MG tablet Take 1 tablet (40 mg total) by mouth daily. 30 tablet 3  . [DISCONTINUED]  famotidine (PEPCID) 20 MG tablet Take 1 tablet (20 mg total) by mouth 2 (two) times daily. 30 tablet 0   No current facility-administered medications on file prior to visit.     Current Medications (verified) Current Outpatient Prescriptions  Medication Sig Dispense Refill  . amitriptyline (ELAVIL) 25 MG tablet Take 1 tablet (25 mg total) by mouth at bedtime. 30 tablet 2  . amLODipine (NORVASC) 5 MG tablet Take 1 tablet (5 mg total) by mouth daily. 90 tablet 1  . amoxicillin (AMOXIL) 500 MG capsule Take 1 capsule (500 mg total) by mouth 3 (three) times daily. 30 capsule 0  . aspirin EC 81 MG tablet Take 81 mg by mouth daily.    . cyclobenzaprine (FLEXERIL) 5 MG tablet Take 1 tablet (5 mg total) by mouth 3 (three) times daily as needed for muscle spasms. 20 tablet 0  . hydrochlorothiazide (HYDRODIURIL) 25 MG tablet Take 1 tablet (25 mg total) by mouth daily. 90 tablet 1  . ondansetron (ZOFRAN ODT) 4 MG disintegrating tablet Take 1 tablet (4 mg total) by mouth every 8 (eight) hours as needed for nausea or vomiting. 20 tablet 0  . potassium chloride SA (K-DUR,KLOR-CON) 20 MEQ tablet 2 tabs by mouth today (02/14/15), then one tab by mouth once daily. 30 tablet 0  . promethazine (PHENADOZ) 25 MG suppository INSERT ONE SUPPOSITORY RECTALLY AS NEEDED 12 suppository 0  . rosuvastatin (CRESTOR) 20 MG tablet TAKE ONE TABLET BY MOUTH ONCE DAILY 60 tablet 1  . SUMAtriptan (IMITREX) 50 MG tablet Take 1 tablet (50 mg total) by mouth every 2 (two) hours as needed for migraine. May repeat in 2 hours if headache persists or recurs. Max 2 tabs/24 hrs 10 tablet 2   No current facility-administered medications for this visit.      Allergies (verified) Ace inhibitors; Celebrex [celecoxib]; and Diovan [valsartan]   PAST HISTORY  Family History Family History  Problem Relation Age of Onset  . Hypertension Mother   . Rheum arthritis Mother   . Hypertension Father   . Diabetes Father   . Prostate cancer  Father   . Kidney disease Father   . Diabetes Sister   . Fibromyalgia Sister   . Diabetes Maternal Grandmother   . Colon cancer Neg Hx   . Lung cancer Maternal Grandfather     Social History Social History  Substance Use Topics  . Smoking status: Former Smoker    Packs/day: 0.50    Years: 18.00    Types: Cigarettes    Start date: 07/29/1977    Quit date: 06/25/1994  . Smokeless tobacco: Never Used     Comment: quit 19 years ago  . Alcohol use 0.0 oz/week     Comment: social drinker     Are there smokers in your home (other than you)? No  Risk Factors Current exercise habits: goes to the  gym  Dietary issues discussed: continue healthy diet   Cardiac risk factors: advanced age (older than 73 for men, 60 for women), diabetes mellitus, dyslipidemia and hypertension.  Depression Screen (Note: if answer to either of the following is "Yes", a more complete depression screening is indicated)   Over the past two weeks, have you felt down, depressed or hopeless? No  Over the past two weeks, have you felt little interest or pleasure in doing things? No  Have you lost interest or pleasure in daily life? No  Do you often feel hopeless? No  Do you cry easily over simple problems? No  Activities of Daily Living In your present state of health, do you have any difficulty performing the following activities?:  Driving? No Managing money?  No Feeding yourself? No Getting from bed to chair? No . Climbing a flight of stairs? No Preparing food and eating?: No Bathing or showering? No Getting dressed: No Getting to the toilet? No Using the toilet:No Moving around from place to place: No In the past year have you fallen or had a near fall?:No   Are you sexually active?  No  Do you have more than one partner?  No  Hearing Difficulties: No Do you often ask people to speak up or repeat themselves? No Do you experience ringing or noises in your ears? No Do you have difficulty  understanding soft or whispered voices? No   Do you feel that you have a problem with memory? No  Do you often misplace items? No  Do you feel safe at home?  Yes  Cognitive Testing  Alert? Yes  Normal Appearance?Yes  Oriented to person? Yes  Place? Yes   Time? Yes  Recall of three objects?  Yes  Can perform simple calculations? Yes  Displays appropriate judgment?Yes  Can read the correct time from a watch face?Yes   Advanced Directives have been discussed with the patient? Yes  List the Names of Other Physician/Practitioners you currently use: 1.  Dr. Burney Gauze  Indicate any recent Medical Services you may have received from other than [facility] providers in the past year (date may be approximate).  Immunization History  Administered Date(s) Administered  . Influenza Split 04/30/2011  . Influenza Whole 04/19/2006, 04/17/2007, 03/08/2009, 03/01/2010  . Influenza, Seasonal, Injecte, Preservative Fre 05/30/2012  . Influenza,inj,Quad PF,36+ Mos 02/15/2014, 03/08/2015  . Pneumococcal Polysaccharide-23 03/08/2009  . Td 05/17/1999  . Tdap 04/30/2011    Screening Tests Health Maintenance  Topic Date Due  . Hepatitis C Screening  Nov 21, 1960  . HIV Screening  09/13/1973  . INFLUENZA VACCINE  01/24/2016  . URINE MICROALBUMIN  02/11/2016  . MAMMOGRAM  11/15/2016  . COLONOSCOPY  12/30/2018  . TETANUS/TDAP  04/29/2021    All answers were reviewed with the patient and necessary referrals were made:  O'SULLIVAN,Deagan Sevin S., NP   04/30/2016   History reviewed: allergies, current medications, past family history, past medical history, past social history, past surgical history and problem list  Review of Systems Pertinent items are noted in HPI.    Objective:     Body mass index is 37.93 kg/m. BP (!) 145/90 (BP Location: Left Arm, Patient Position: Sitting, Cuff Size: Large)   Pulse 77   Temp 98.7 F (37.1 C) (Oral)   Resp 18   Ht 5\' 4"  (1.626 m)   Wt 221 lb (100.2 kg)    SpO2 100% Comment: RA  BMI 37.93 kg/m   Physical Exam  Constitutional: She is oriented  to person, place, and time. She appears well-developed and well-nourished. No distress.  HENT:  Head: Normocephalic and atraumatic.  Right Ear: Tympanic membrane and ear canal normal.  Left Ear: Tympanic membrane and ear canal normal.  Mouth/Throat: Oropharynx is clear and moist.  Eyes: Pupils are equal, round, and reactive to light. No scleral icterus.  Neck: Normal range of motion. No thyromegaly present.  Cardiovascular: Normal rate and regular rhythm.   No murmur heard. Pulmonary/Chest: Effort normal and breath sounds normal. No respiratory distress. He has no wheezes. She has no rales. She exhibits no tenderness.  Abdominal: Soft. Bowel sounds are normal. She exhibits no distension and no mass. There is no tenderness. There is no rebound and no guarding.  Musculoskeletal: She exhibits no edema.  Lymphadenopathy:    She has no cervical adenopathy.  Neurological: She is alert and oriented to person, place, and time. She has normal patellar reflexes. She exhibits normal muscle tone. Coordination normal.  Skin: Skin is warm and dry.  Psychiatric: She has a normal mood and affect. Her behavior is normal. Judgment and thought content normal.       Assessment & Plan:        Assessment:    Low back pain- will refer for PT.     Headache- Pt given IM toradol in the office today.   Plan:     During the course of the visit the patient was educated and counseled about appropriate screening and preventive services including:    Influenza vaccine  Nutrition counseling   Advanced directives: no advanced directive, advised pt to initiate and give [place] a copy for her medical record.   Diet review for nutrition referral? Yes ____  Not Indicated _x___   Patient Instructions (the written plan) was given to the patient.  Medicare Attestation I have personally reviewed: The patient's medical and  social history Their use of alcohol, tobacco or illicit drugs Their current medications and supplements The patient's functional ability including ADLs,fall risks, home safety risks, cognitive, and hearing and visual impairment Diet and physical activities Evidence for depression or mood disorders  The patient's weight, height, BMI, and visual acuity have been recorded in the chart.  I have made referrals, counseling, and provided education to the patient based on review of the above and I have provided the patient with a written personalized care plan for preventive services.     O'SULLIVAN,Thaddaeus Granja S., NP   04/30/2016

## 2016-04-30 NOTE — Patient Instructions (Addendum)
Try crestor every other day and let me know how you are feeling in 1-2 weeks (in terms of muscle pain). Complete lab work prior to leaving. You will be contacted about your referral to physical therapy for your back pain.

## 2016-04-30 NOTE — Progress Notes (Signed)
Pre visit review using our clinic review tool, if applicable. No additional management support is needed unless otherwise documented below in the visit note. 

## 2016-05-01 LAB — HEMOGLOBIN A1C: Hgb A1c MFr Bld: 6.2 % (ref 4.6–6.5)

## 2016-05-02 ENCOUNTER — Encounter: Payer: Self-pay | Admitting: Family

## 2016-05-03 ENCOUNTER — Ambulatory Visit (HOSPITAL_BASED_OUTPATIENT_CLINIC_OR_DEPARTMENT_OTHER): Payer: Medicare Other

## 2016-05-03 ENCOUNTER — Other Ambulatory Visit (HOSPITAL_BASED_OUTPATIENT_CLINIC_OR_DEPARTMENT_OTHER): Payer: Medicare Other

## 2016-05-05 NOTE — Assessment & Plan Note (Signed)
Uncontrolled, refer back to GI.  May also need endo for dysphagia.

## 2016-05-05 NOTE — Assessment & Plan Note (Signed)
Fair bp. Continue current medicaitons.

## 2016-05-05 NOTE — Assessment & Plan Note (Signed)
Some myalgia, will see if she can better tolerate QOD crestor.

## 2016-05-07 ENCOUNTER — Ambulatory Visit (HOSPITAL_BASED_OUTPATIENT_CLINIC_OR_DEPARTMENT_OTHER)
Admission: RE | Admit: 2016-05-07 | Discharge: 2016-05-07 | Disposition: A | Discharge status: Home / Self Care | Payer: Medicare Other | Source: Ambulatory Visit | Attending: Family | Admitting: Family

## 2016-05-07 DIAGNOSIS — Z1239 Encounter for other screening for malignant neoplasm of breast: Secondary | ICD-10-CM

## 2016-05-07 DIAGNOSIS — M85852 Other specified disorders of bone density and structure, left thigh: Secondary | ICD-10-CM | POA: Insufficient documentation

## 2016-05-07 DIAGNOSIS — Z1231 Encounter for screening mammogram for malignant neoplasm of breast: Secondary | ICD-10-CM | POA: Diagnosis not present

## 2016-05-07 DIAGNOSIS — Z23 Encounter for immunization: Secondary | ICD-10-CM

## 2016-05-07 DIAGNOSIS — E2839 Other primary ovarian failure: Secondary | ICD-10-CM

## 2016-05-09 ENCOUNTER — Ambulatory Visit: Payer: Medicare Other | Admitting: Gastroenterology

## 2016-05-09 ENCOUNTER — Encounter: Payer: Self-pay | Admitting: Family

## 2016-05-09 MED ORDER — CALCIUM CARBONATE-VITAMIN D 600-400 MG-UNIT PO TABS: 1.0000 | ORAL_TABLET | Freq: Two times a day (BID) | ORAL | Status: DC

## 2016-05-11 ENCOUNTER — Ambulatory Visit: Payer: Medicare Other | Admitting: Physical Therapy

## 2016-05-15 ENCOUNTER — Telehealth: Payer: Self-pay | Admitting: Family

## 2016-05-15 NOTE — Telephone Encounter (Signed)
Relation to [id number] Call back number:[phone]   Reason for call:  Patient called and stated to cancel physical therapy appointment at this time, patient states physical therapy is not needed right now, appointment cancelled.

## 2016-05-15 NOTE — Telephone Encounter (Signed)
Noted  

## 2016-05-16 ENCOUNTER — Ambulatory Visit: Payer: Medicare Other | Admitting: Physical Therapy

## 2016-06-28 ENCOUNTER — Other Ambulatory Visit: Payer: Self-pay

## 2016-06-28 ENCOUNTER — Ambulatory Visit: Payer: Medicare Other | Admitting: Gastroenterology

## 2016-07-02 ENCOUNTER — Encounter: Payer: Self-pay | Admitting: Family

## 2016-08-01 ENCOUNTER — Telehealth: Payer: Self-pay | Admitting: Hematology & Oncology

## 2016-08-01 NOTE — Telephone Encounter (Signed)
Patient called and cx 08/03/16 apt and resch for 08/13/16

## 2016-08-03 ENCOUNTER — Other Ambulatory Visit: Payer: Medicare Other

## 2016-08-03 ENCOUNTER — Ambulatory Visit: Payer: Medicare Other | Admitting: Family

## 2016-08-13 ENCOUNTER — Ambulatory Visit (HOSPITAL_BASED_OUTPATIENT_CLINIC_OR_DEPARTMENT_OTHER): Payer: Medicare Other | Admitting: Family

## 2016-08-13 ENCOUNTER — Other Ambulatory Visit: Payer: Medicare Other

## 2016-08-13 ENCOUNTER — Ambulatory Visit: Payer: Medicare Other | Admitting: Family

## 2016-08-13 ENCOUNTER — Other Ambulatory Visit (HOSPITAL_BASED_OUTPATIENT_CLINIC_OR_DEPARTMENT_OTHER): Payer: Medicare Other

## 2016-08-13 VITALS — BP 145/82 | HR 61 | Temp 97.8°F | Resp 18 | Wt 223.5 lb

## 2016-08-13 DIAGNOSIS — G8929 Other chronic pain: Secondary | ICD-10-CM

## 2016-08-13 DIAGNOSIS — D509 Iron deficiency anemia, unspecified: Secondary | ICD-10-CM

## 2016-08-13 DIAGNOSIS — M545 Low back pain: Secondary | ICD-10-CM

## 2016-08-13 DIAGNOSIS — D508 Other iron deficiency anemias: Secondary | ICD-10-CM

## 2016-08-13 LAB — COMPREHENSIVE METABOLIC PANEL
ALT: 26 U/L (ref 0–55)
AST: 17 U/L (ref 5–34)
Albumin: 3.7 g/dL (ref 3.5–5.0)
Alkaline Phosphatase: 146 U/L (ref 40–150)
Anion Gap: 10 mEq/L (ref 3–11)
BUN: 14.6 mg/dL (ref 7.0–26.0)
CO2: 27 mEq/L (ref 22–29)
Calcium: 9.7 mg/dL (ref 8.4–10.4)
Chloride: 103 mEq/L (ref 98–109)
Creatinine: 0.8 mg/dL (ref 0.6–1.1)
EGFR: 89 mL/min/{1.73_m2} — ABNORMAL LOW (ref >90)
Glucose: 111 mg/dl (ref 70–140)
Potassium: 3.5 mEq/L (ref 3.5–5.1)
Sodium: 140 mEq/L (ref 136–145)
Total Bilirubin: <0.22 mg/dL (ref 0.20–1.20)
Total Protein: 7.9 g/dL (ref 6.4–8.3)

## 2016-08-13 LAB — CBC WITH DIFFERENTIAL (CANCER CENTER ONLY)
BASO#: 0 10*3/uL (ref 0.0–0.2)
BASO%: 0.1 % (ref 0.0–2.0)
EOS%: 1.3 % (ref 0.0–7.0)
Eosinophils Absolute: 0.1 10*3/uL (ref 0.0–0.5)
HCT: 38.9 % (ref 34.8–46.6)
HGB: 12.3 g/dL (ref 11.6–15.9)
LYMPH#: 3 10*3/uL (ref 0.9–3.3)
LYMPH%: 42.4 % (ref 14.0–48.0)
MCH: 23.4 pg — ABNORMAL LOW (ref 26.0–34.0)
MCHC: 31.6 g/dL — ABNORMAL LOW (ref 32.0–36.0)
MCV: 74 fL — ABNORMAL LOW (ref 81–101)
MONO#: 0.5 10*3/uL (ref 0.1–0.9)
MONO%: 7.2 % (ref 0.0–13.0)
NEUT#: 3.5 10*3/uL (ref 1.5–6.5)
NEUT%: 49 % (ref 39.6–80.0)
Platelets: 223 10*3/uL (ref 145–400)
RBC: 5.26 10*6/uL (ref 3.70–5.32)
RDW: 13.8 % (ref 11.1–15.7)
WBC: 7.1 10*3/uL (ref 3.9–10.0)

## 2016-08-13 LAB — IRON AND TIBC
%SAT: 42 % (ref 21–57)
Iron: 108 ug/dL (ref 41–142)
TIBC: 256 ug/dL (ref 236–444)
UIBC: 148 ug/dL (ref 120–384)

## 2016-08-13 LAB — FERRITIN: Ferritin: 1045 ng/ml — ABNORMAL HIGH (ref 9–269)

## 2016-08-13 NOTE — Progress Notes (Signed)
Hematology and Oncology Follow Up Visit  [name] [id number] October 21, 1960 56 y.o. 08/13/2016   Principle Diagnosis:  Recurrent iron deficiency anemia  Current Therapy:   IV iron as indicated - last dose in May 2017    Interim History: [name] is here today for follow-up. She is feeling fatigued but otherwise has no complaints at this time.  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  She has had no issue with infections. No fever, chills, cough, rash, headache, dizziness, SOB, chest pain, palpitations, abdominal pain or changes in bowel or bladder habits. She has gastroparesis which causes her to have occasional nausea. No vomiting.  With the gastroparesis she also experiences constipation and will take Miralax as needed.  No change with her chronic back pain or occasional numbness and tingling in the fingers in her right hand. No swelling in her extremities.  No episodes of bleeding or bruising. No lymphadenopathy found on exam.  He appetite continues to come and go. She is staying well hydrated. Her weight is stable.   Medications:  Allergies as of 08/13/2016      Reactions   Ace Inhibitors    REACTION: chronic cough   Celebrex [celecoxib] Other (See Comments)   "makes me bleed"   Diovan [valsartan]    angioedema      Medication List       Accurate as of 08/13/16  8:52 AM. Always use your most recent med list.          amitriptyline 25 MG tablet Commonly known as:  ELAVIL Take 1 tablet (25 mg total) by mouth at bedtime.   amLODipine 5 MG tablet Commonly known as:  NORVASC Take 1 tablet (5 mg total) by mouth daily.   aspirin EC 81 MG tablet Take 81 mg by mouth daily.   Calcium Carbonate-Vitamin D 600-400 MG-UNIT tablet Commonly known as:  CALTRATE 600+D Take 1 tablet by mouth 2 (two) times daily.   hydrochlorothiazide 25 MG tablet Commonly known as:  HYDRODIURIL Take 1 tablet (25 mg total) by mouth daily.     promethazine 25 MG suppository Commonly known as:  PHENADOZ INSERT ONE SUPPOSITORY RECTALLY AS NEEDED   rosuvastatin 20 MG tablet Commonly known as:  CRESTOR TAKE ONE TABLET BY MOUTH ONCE DAILY   SUMAtriptan 50 MG tablet Commonly known as:  IMITREX Take 1 tablet (50 mg total) by mouth every 2 (two) hours as needed for migraine. May repeat in 2 hours if headache persists or recurs. Max 2 tabs/24 hrs       Allergies:  Allergies  Allergen Reactions  . Ace Inhibitors     REACTION: chronic cough  . Celebrex [Celecoxib] Other (See Comments)    "makes me bleed"  . Diovan [Valsartan]     angioedema    Past Medical History, Surgical history, Social history, and Family History were reviewed and updated.  Review of Systems: All other 10 point review of systems is negative.   Physical Exam:  weight is 223 lb 8 oz (101.4 kg). Her oral temperature is 97.8 F (36.6 C). Her blood pressure is 145/82 (abnormal) and her pulse is 61. Her respiration is 18 and oxygen saturation is 100%.   Wt Readings from Last 3 Encounters:  08/13/16 223 lb 8 oz (101.4 kg)  04/30/16 221 lb (100.2 kg)  04/13/16 223 lb 3.2 oz (101.2 kg)    Ocular: Sclerae unicteric, pupils equal, round and reactive to light Ear-nose-throat: Oropharynx clear,  dentition fair Lymphatic: No cervical supraclavicular or axillary adenopathy Lungs no rales or rhonchi, good excursion bilaterally Heart regular rate and rhythm, no murmur appreciated Abd soft, nontender, positive bowel sounds, no liver or spleen tip palpated on exam, no fluid wave  MSK no focal spinal tenderness, no joint edema Neuro: non-focal, well-oriented, appropriate affect Breasts: Deferred  Lab Results  Component Value Date   WBC 7.1 08/13/2016   HGB 12.3 08/13/2016   HCT 38.9 08/13/2016   MCV 74 (L) 08/13/2016   PLT 223 08/13/2016   Lab Results  Component Value Date   FERRITIN 1,104 (H) 03/30/2016   IRON 78 03/30/2016   TIBC 266 03/30/2016    UIBC 188 03/30/2016   IRONPCTSAT 29 03/30/2016   Lab Results  Component Value Date   RETICCTPCT 1.0 04/08/2015   RBC 5.26 08/13/2016   RETICCTABS 48.5 04/08/2015   No results found for: Nils Pyle [facility] Lab Results  Component Value Date   IGA 305 05/04/2009   Lab Results  Component Value Date   TOTALPROTELP 7.2 05/04/2009   ALBUMINELP 49.1 (L) 05/04/2009   A1GS 8.3 (H) 05/04/2009   A2GS 14.1 (H) 05/04/2009   BETS 6.6 05/04/2009   GAMS 16.4 05/04/2009   MSPIKE NOT DET 05/04/2009     Chemistry      Component Value Date/Time   NA 139 03/30/2016 0921   K 3.6 03/30/2016 0921   CL 99 08/16/2015 1055   CO2 25 03/30/2016 0921   BUN 15.5 03/30/2016 0921   CREATININE 0.8 03/30/2016 0921      Component Value Date/Time   CALCIUM 9.4 03/30/2016 0921   ALKPHOS 139 03/30/2016 0921   AST 18 03/30/2016 0921   ALT 26 03/30/2016 0921   BILITOT <0.30 03/30/2016 0921     Impression and Plan: [name] is a very pleasant 56 yo African American female with recurrent iron deficiency anemia. She is symptomatic at this time with fatigued. Her Hgb is stable at 12.3 with an MCV of 74.  We will see what her iron studies show and bring her back in later this week for an infusion if needed.  She will start taking over the counter folic acid daily.  We will go ahead and plan to see her back in 4 months for labs and follow-up.  She will contact our office with any questions or concerns. We can certainly see her sooner if need be.   Eliezer Bottom, NP 2/19/20188:52 AM

## 2016-08-14 ENCOUNTER — Telehealth: Payer: Self-pay

## 2016-08-14 LAB — RETICULOCYTES: Reticulocyte Count: 1 % (ref 0.6–2.6)

## 2016-08-14 NOTE — Telephone Encounter (Addendum)
-----   Message from Eliezer Bottom, NP sent at 08/13/2016 11:25 AM EST ----- Regarding: Iron  Iron studies look great, no infusion needed. Thank you!   Sarah  Above message left on personalized VM with instructions to contact our office for questions/concerns. dph

## 2016-08-15 ENCOUNTER — Telehealth: Payer: Self-pay | Admitting: *Deleted

## 2016-08-15 NOTE — Telephone Encounter (Addendum)
Patient aware of results  ----- Message from Eliezer Bottom, NP sent at 08/14/2016  4:51 PM EST ----- Regarding: Iron  Iron looks good. No infusion needed. Thank you!  Sarah  ----- Message ----- From: Interface, Lab In Three Zero One Sent: 08/13/2016   8:04 AM To: Eliezer Bottom, NP

## 2016-10-15 ENCOUNTER — Telehealth: Payer: Self-pay | Admitting: Family

## 2016-10-16 NOTE — Telephone Encounter (Signed)
Sent refills on amlodipine and HCTZ.  Pt last seen 03/2016 for acute visit. When should pt follow up with [place]?

## 2016-10-16 NOTE — Telephone Encounter (Signed)
Due for follow up please

## 2016-10-17 NOTE — Telephone Encounter (Signed)
Called pt to schedule F/U, no answer, no vm set up.

## 2016-10-18 NOTE — Telephone Encounter (Signed)
Pt called in today to schedule. Pt is scheduled for 10/19/16 at 3:00p

## 2016-10-18 NOTE — Telephone Encounter (Signed)
Please send recall letter.

## 2016-10-19 ENCOUNTER — Ambulatory Visit (INDEPENDENT_AMBULATORY_CARE_PROVIDER_SITE_OTHER): Payer: Medicare Other | Admitting: Family

## 2016-10-19 ENCOUNTER — Encounter: Payer: Self-pay | Admitting: Family

## 2016-10-19 VITALS — BP 126/78 | HR 74 | Temp 98.3°F | Resp 16 | Ht 64.0 in | Wt 223.6 lb

## 2016-10-19 DIAGNOSIS — R739 Hyperglycemia, unspecified: Secondary | ICD-10-CM

## 2016-10-19 DIAGNOSIS — E785 Hyperlipidemia, unspecified: Secondary | ICD-10-CM | POA: Diagnosis not present

## 2016-10-19 DIAGNOSIS — I10 Essential (primary) hypertension: Secondary | ICD-10-CM | POA: Diagnosis not present

## 2016-10-19 LAB — BASIC METABOLIC PANEL
BUN: 15 mg/dL (ref 7–25)
CO2: 27 mmol/L (ref 20–31)
Calcium: 9.3 mg/dL (ref 8.6–10.4)
Chloride: 99 mmol/L (ref 98–110)
Creat: 1.05 mg/dL (ref 0.50–1.05)
Glucose, Bld: 103 mg/dL — ABNORMAL HIGH (ref 65–99)
Potassium: 3.4 mmol/L — ABNORMAL LOW (ref 3.5–5.3)
Sodium: 138 mmol/L (ref 135–146)

## 2016-10-19 LAB — LIPID PANEL
Cholesterol: 346 mg/dL — ABNORMAL HIGH (ref <200)
HDL: 45 mg/dL — ABNORMAL LOW (ref >50)
LDL Cholesterol: 265 mg/dL — ABNORMAL HIGH (ref <100)
Total CHOL/HDL Ratio: 7.7 Ratio — ABNORMAL HIGH (ref <5.0)
Triglycerides: 181 mg/dL — ABNORMAL HIGH (ref <150)
VLDL: 36 mg/dL — ABNORMAL HIGH (ref <30)

## 2016-10-19 MED ORDER — AMLODIPINE BESYLATE 5 MG PO TABS: 5.0000 mg | ORAL_TABLET | Freq: Every day | ORAL | 1 refills | Status: DC

## 2016-10-19 MED ORDER — SUMATRIPTAN SUCCINATE 50 MG PO TABS: ORAL_TABLET | ORAL | 2 refills | Status: DC

## 2016-10-19 MED ORDER — HYDROCHLOROTHIAZIDE 25 MG PO TABS: 25.0000 mg | ORAL_TABLET | Freq: Every day | ORAL | 0 refills | Status: DC

## 2016-10-19 MED ORDER — ROSUVASTATIN CALCIUM 20 MG PO TABS: ORAL_TABLET | ORAL | 1 refills | Status: DC

## 2016-10-19 MED ORDER — AMITRIPTYLINE HCL 25 MG PO TABS: 25.0000 mg | ORAL_TABLET | Freq: Every day | ORAL | 1 refills | Status: DC

## 2016-10-19 NOTE — Patient Instructions (Addendum)
Please complete lab work prior to leaving.   

## 2016-10-19 NOTE — Assessment & Plan Note (Signed)
Obtain A1C.  Continue healthy diet and exercise.

## 2016-10-19 NOTE — Progress Notes (Signed)
Subjective:    Patient ID: [name], female    DOB: [birth year], 58 y.o.   MRN: [id number]  HPI  Hyperglycemia- reports good diet and walking Lab Results  Component Value Date   HGBA1C 6.2 04/30/2016   HGBA1C 6.1 02/11/2015   HGBA1C 6.3 10/06/2014   Lab Results  Component Value Date   MICROALBUR <0.7 02/11/2015   LDLCALC 122 (H) 11/16/2015   CREATININE 0.8 08/13/2016   Hyperlipidemia- had arthralgia on daily crestor.  Lab Results  Component Value Date   CHOL 180 11/16/2015   HDL 35.50 (L) 11/16/2015   LDLCALC 122 (H) 11/16/2015   LDLDIRECT 204.0 11/01/2014   TRIG 111.0 11/16/2015   CHOLHDL 5 11/16/2015   Taking 2x a week.    HTN- denies CP/SOB or swelling.  BP Readings from Last 3 Encounters:  10/19/16 126/78  08/13/16 (!) 145/82  04/30/16 (!) 145/90   Migraine- no recent migraines.    Review of Systems    see HPI  Past Medical History:  Diagnosis Date  . Allergy    allergic rhinitis  . Atypical chest pain   . Constipation   . Cyst, ovarian   . Diabetes mellitus, type II ([place])   . Endometriosis   . Esophageal stricture   . Gastroparesis   . GERD (gastroesophageal reflux disease)   . Hyperlipidemia   . Hypertension   . 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 Iron deficiency anemia   . Migraine headache   . Non-compliance   . Peptic ulcer disease   . Routine general medical examination at a health care facility      Social History   Social History  . Marital status: Married    Spouse name: N/A  . Number of children: 2  . Years of education: N/A   Occupational History  . DISABILITY Unemployed   Social History Main Topics  . Smoking status: Former Smoker    Packs/day: 0.50    Years: 18.00    Types: Cigarettes    Start date: 07/29/1977    Quit date: 06/25/1994  . Smokeless tobacco: Never Used     Comment: quit 19 years ago  . Alcohol use 0.0 oz/week     Comment: social drinker    . Drug use: No  . Sexual activity: Not on file   Other Topics Concern  . Not on file   Social History Narrative   Married    Has 2 grown children.  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   Former Smoker Quit tobacco in 1996.  She was a pack a day smoker for approximately 10 years.   Alcohol use-yes: Social    Daily Caffeine Use:6 pack of pepsi daily     Illicit Drug Use - no    Patient does not get regular exercise.       Smoking Status:  quit    Past Surgical History:  Procedure Laterality Date  . ABDOMINAL EXPLORATION SURGERY     w/bso   . CARDIAC CATHETERIZATION  2009   mild non obstructive CAD  . CHOLECYSTECTOMY    . KNEE SURGERY  2005    left knee  . TOTAL ABDOMINAL HYSTERECTOMY      Family History  Problem Relation Age of Onset  . Hypertension Mother   . Rheum arthritis Mother   . Hypertension Father   . Diabetes Father   . Prostate cancer Father   . Kidney  disease Father   . Diabetes Sister   . Fibromyalgia Sister   . Diabetes Maternal Grandmother   . Lung cancer Maternal Grandfather   . Colon cancer Neg Hx     Allergies  Allergen Reactions  . Ace Inhibitors     REACTION: chronic cough  . Celebrex [Celecoxib] Other (See Comments)    "makes me bleed"  . Diovan [Valsartan]     angioedema    Current Outpatient Prescriptions on File Prior to Visit  Medication Sig Dispense Refill  . amitriptyline (ELAVIL) 25 MG tablet Take 1 tablet (25 mg total) by mouth at bedtime. 30 tablet 2  . amLODipine (NORVASC) 5 MG tablet TAKE ONE TABLET BY MOUTH ONCE DAILY 90 tablet 0  . aspirin EC 81 MG tablet Take 81 mg by mouth daily.    . hydrochlorothiazide (HYDRODIURIL) 25 MG tablet TAKE ONE TABLET BY MOUTH ONCE DAILY 90 tablet 0  . promethazine (PHENADOZ) 25 MG suppository INSERT ONE SUPPOSITORY RECTALLY AS NEEDED 12 suppository 0  . rosuvastatin (CRESTOR) 20 MG tablet TAKE ONE TABLET BY MOUTH ONCE DAILY (Patient taking differently: Take tablet twice a week) 60 tablet  1  . SUMAtriptan (IMITREX) 50 MG tablet Take 1 tablet (50 mg total) by mouth every 2 (two) hours as needed for migraine. May repeat in 2 hours if headache persists or recurs. Max 2 tabs/24 hrs 10 tablet 2  . [DISCONTINUED] atorvastatin (LIPITOR) 40 MG tablet Take 1 tablet (40 mg total) by mouth daily. 30 tablet 3  . [DISCONTINUED] famotidine (PEPCID) 20 MG tablet Take 1 tablet (20 mg total) by mouth 2 (two) times daily. 30 tablet 0   No current facility-administered medications on file prior to visit.     BP 126/78 (BP Location: Right Arm, Cuff Size: Normal)   Pulse 74   Temp 98.3 F (36.8 C) (Oral)   Resp 16   Ht 5\' 4"  (1.626 m)   Wt 223 lb 9.6 oz (101.4 kg)   SpO2 100% Comment: room air  BMI 38.38 kg/m    Objective:   Physical Exam  Constitutional: She is oriented to person, place, and time. She appears well-developed and well-nourished.  HENT:  Head: Normocephalic and atraumatic.  Cardiovascular: Normal rate, regular rhythm and normal heart sounds.   No murmur heard. Pulmonary/Chest: Effort normal and breath sounds normal. No respiratory distress. She has no wheezes.  Neurological: She is alert and oriented to person, place, and time.  Psychiatric: She has a normal mood and affect. Her behavior is normal. Judgment and thought content normal.          Assessment & Plan:

## 2016-10-19 NOTE — Assessment & Plan Note (Signed)
BP is stable on current medications.  

## 2016-10-19 NOTE — Progress Notes (Signed)
Pre visit review using our clinic review tool, if applicable. No additional management support is needed unless otherwise documented below in the visit note. 

## 2016-10-19 NOTE — Telephone Encounter (Signed)
Noted  

## 2016-10-19 NOTE — Assessment & Plan Note (Signed)
Obtain follow up lipid panel. Ok to continue 2x weekly dosing of crestor.

## 2016-10-20 LAB — HEMOGLOBIN A1C
Hgb A1c MFr Bld: 5.9 % — ABNORMAL HIGH (ref <5.7)
Mean Plasma Glucose: 123 mg/dL

## 2016-10-21 ENCOUNTER — Other Ambulatory Visit: Payer: Self-pay | Admitting: Family

## 2016-10-21 DIAGNOSIS — E785 Hyperlipidemia, unspecified: Secondary | ICD-10-CM

## 2016-10-21 DIAGNOSIS — E876 Hypokalemia: Secondary | ICD-10-CM

## 2016-10-21 NOTE — Telephone Encounter (Signed)
Sugar looks good, Cholesterol looks terribly high.  Has she been taking the crestor 2 x a week?  If so, I would like her to increase to 40mg  of crestor every other day and then repeat lipids in 6 weeks fasting. Let me know if worsening muscle pain on this regimen.

## 2016-10-21 NOTE — Telephone Encounter (Signed)
Also, K is low. Add kdur once daily. Repeat bmet in 1 week. Dx hypokalemia.

## 2016-10-22 ENCOUNTER — Telehealth: Payer: Self-pay | Admitting: *Deleted

## 2016-10-22 NOTE — Telephone Encounter (Signed)
Received fax from [facility] requesting clarification of pt's sumatriptan directions. Attempted to notify pharmacy and left message on voicemail.

## 2016-10-23 MED ORDER — POTASSIUM CHLORIDE CRYS ER 20 MEQ PO TBCR: 20.0000 meq | EXTENDED_RELEASE_TABLET | Freq: Every day | ORAL | 3 refills | Status: DC

## 2016-10-23 MED ORDER — ROSUVASTATIN CALCIUM 40 MG PO TABS: 40.0000 mg | ORAL_TABLET | ORAL | 1 refills | Status: DC

## 2016-10-23 NOTE — Telephone Encounter (Signed)
Left message for pt to return my call.

## 2016-10-23 NOTE — Telephone Encounter (Signed)
Notified pt of below. She has been taking 20mg  Crestor twice a week due to myalgias. She voices understanding about below instructions. Future lab appts scheduled for 10/30/16 and 12/04/16. Rx sent for potassium and crestor 40mg .

## 2016-10-23 NOTE — Telephone Encounter (Signed)
[name]--just wanted to verify what the increased Crestor dose should be? She is currently doing 20mg  twice a week and below says increase to 40mg  every other day? Is this correct?

## 2016-10-23 NOTE — Telephone Encounter (Signed)
Yes please

## 2016-10-30 ENCOUNTER — Other Ambulatory Visit: Payer: Medicare Other

## 2016-10-31 ENCOUNTER — Other Ambulatory Visit (INDEPENDENT_AMBULATORY_CARE_PROVIDER_SITE_OTHER): Payer: Medicare Other

## 2016-10-31 DIAGNOSIS — E785 Hyperlipidemia, unspecified: Secondary | ICD-10-CM

## 2016-10-31 DIAGNOSIS — E876 Hypokalemia: Secondary | ICD-10-CM | POA: Diagnosis not present

## 2016-11-01 ENCOUNTER — Encounter: Payer: Self-pay | Admitting: Family

## 2016-11-01 LAB — BASIC METABOLIC PANEL
BUN: 13 mg/dL (ref 6–23)
CO2: 27 mEq/L (ref 19–32)
Calcium: 9.5 mg/dL (ref 8.4–10.5)
Chloride: 103 mEq/L (ref 96–112)
Creatinine, Ser: 0.77 mg/dL (ref 0.40–1.20)
GFR: 98.97 mL/min (ref >60.00)
Glucose, Bld: 104 mg/dL — ABNORMAL HIGH (ref 70–99)
Potassium: 3.7 mEq/L (ref 3.5–5.1)
Sodium: 140 mEq/L (ref 135–145)

## 2016-11-01 LAB — LIPID PANEL
Cholesterol: 211 mg/dL — ABNORMAL HIGH (ref 0–200)
HDL: 45.5 mg/dL (ref >39.00)
NonHDL: 165.02
Total CHOL/HDL Ratio: 5
Triglycerides: 274 mg/dL — ABNORMAL HIGH (ref 0.0–149.0)
VLDL: 54.8 mg/dL — ABNORMAL HIGH (ref 0.0–40.0)

## 2016-11-01 LAB — LDL CHOLESTEROL, DIRECT: Direct LDL: 131 mg/dL

## 2016-11-27 ENCOUNTER — Ambulatory Visit: Payer: Medicare Other | Admitting: Family

## 2016-11-28 ENCOUNTER — Encounter: Payer: Self-pay | Admitting: Family Medicine

## 2016-11-28 ENCOUNTER — Ambulatory Visit (INDEPENDENT_AMBULATORY_CARE_PROVIDER_SITE_OTHER): Payer: Medicare Other | Admitting: Family Medicine

## 2016-11-28 ENCOUNTER — Ambulatory Visit (HOSPITAL_BASED_OUTPATIENT_CLINIC_OR_DEPARTMENT_OTHER)
Admission: RE | Admit: 2016-11-28 | Discharge: 2016-11-28 | Disposition: A | Discharge status: Home / Self Care | Payer: Medicare Other | Source: Ambulatory Visit | Attending: Family Medicine | Admitting: Family Medicine

## 2016-11-28 VITALS — BP 116/70 | HR 65 | Temp 97.8°F | Ht 64.0 in | Wt 224.0 lb

## 2016-11-28 DIAGNOSIS — M79674 Pain in right toe(s): Secondary | ICD-10-CM

## 2016-11-28 DIAGNOSIS — M7731 Calcaneal spur, right foot: Secondary | ICD-10-CM | POA: Insufficient documentation

## 2016-11-28 MED ORDER — METHYLPREDNISOLONE 4 MG PO TBPK: ORAL_TABLET | ORAL | 0 refills | Status: DC

## 2016-11-28 NOTE — Patient Instructions (Signed)
Cancel appt if you are doing better.  OK to take Tylenol 1000 mg (2 extra strength tabs) or 975 mg (3 regular strength tabs) every 6 hours as needed.  Ice/cold pack over area for 10-15 min every 2-3 hours while awake.  Hold off on heat unless it helps.

## 2016-11-28 NOTE — Progress Notes (Signed)
Musculoskeletal Exam  Patient: [name] DOB: May 24, 1961  DOS: 11/28/2016  SUBJECTIVE:  Chief Complaint:   Chief Complaint  Patient presents with  . Toe Pain    (R) great toe-since Sunday    [name] is a 56 y.o.  female for evaluation and treatment of R great toe pain.   Onset:  3 days ago. Sudden, no injury or change in activity.  Location: IP joint of R great toe Character:  aching and throbbing  Progression of issue:  is unchanged Associated symptoms: Hurts to walk; denies swelling, bruising, catching/locking, redness Treatment: to date has been ice and soaking.   Neurovascular symptoms: no  ROS: Musculoskeletal/Extremities: +R toe pain Neurologic: no numbness, tingling no weakness   Past Medical History:  Diagnosis Date  . Allergy    allergic rhinitis  . Atypical chest pain   . Constipation   . Cyst, ovarian   . Diabetes mellitus, type II ([place])   . Endometriosis   . Esophageal stricture   . Gastroparesis   . GERD (gastroesophageal reflux disease)   . Hyperlipidemia   . Hypertension   . 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 Iron deficiency anemia   . Migraine headache   . Non-compliance   . Peptic ulcer disease    Past Surgical History:  Procedure Laterality Date  . ABDOMINAL EXPLORATION SURGERY     w/bso   . CARDIAC CATHETERIZATION  2009   mild non obstructive CAD  . CHOLECYSTECTOMY    . KNEE SURGERY  2005    left knee  . TOTAL ABDOMINAL HYSTERECTOMY     Family History  Problem Relation Age of Onset  . Hypertension Mother   . Rheum arthritis Mother   . Hypertension Father   . Diabetes Father   . Prostate cancer Father   . Kidney disease Father   . Diabetes Sister   . Fibromyalgia Sister   . Diabetes Maternal Grandmother   . Lung cancer Maternal Grandfather   . Colon cancer Neg Hx    Current Outpatient Prescriptions  Medication Sig Dispense Refill  . amitriptyline  (ELAVIL) 25 MG tablet Take 1 tablet (25 mg total) by mouth at bedtime. 90 tablet 1  . amLODipine (NORVASC) 5 MG tablet Take 1 tablet (5 mg total) by mouth daily. 90 tablet 1  . aspirin EC 81 MG tablet Take 81 mg by mouth daily.    . hydrochlorothiazide (HYDRODIURIL) 25 MG tablet Take 1 tablet (25 mg total) by mouth daily. 90 tablet 0  . potassium chloride SA (K-DUR,KLOR-CON) 20 MEQ tablet Take 1 tablet (20 mEq total) by mouth daily. 30 tablet 3  . promethazine (PHENADOZ) 25 MG suppository INSERT ONE SUPPOSITORY RECTALLY AS NEEDED 12 suppository 0  . rosuvastatin (CRESTOR) 40 MG tablet Take 1 tablet (40 mg total) by mouth every other day. 15 tablet 1  . SUMAtriptan (IMITREX) 50 MG tablet May repeat in 2 hours if headache not resolved. Max 2 tabs/24 hrs (Patient taking differently: Take 1 tablet at onset of migraine. May repeat in 2 hours if headache not resolved. Max 2 tabs/24 hrs.) 10 tablet 2   Allergies  Allergen Reactions  . Ace Inhibitors     REACTION: chronic cough  . Celebrex [Celecoxib] Other (See Comments)    "makes me bleed"  . Diovan [Valsartan]     angioedema   Social History   Social History  . Marital status: Married  . Number  of children: 2   Occupational History  . DISABILITY Unemployed   Social History Main Topics  . Smoking status: Former Smoker    Packs/day: 0.50    Years: 18.00    Types: Cigarettes    Start date: 07/29/1977    Quit date: 06/25/1994  . Smokeless tobacco: Never Used     Comment: quit 19 years ago  . Alcohol use 0.0 oz/week     Comment: social drinker  . Drug use: No   Social History Narrative   Married    Has 2 grown children.  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   Former Smoker Quit tobacco in 1996.  She was a pack a day smoker for approximately 10 years.   Alcohol use-yes: Social    Daily Caffeine Use:6 pack of pepsi daily     Illicit Drug Use - no    Patient does not get regular exercise.       Smoking Status:  quit     Objective: VITAL SIGNS: BP 116/70 (BP Location: Left Arm, Patient Position: Sitting, Cuff Size: Normal)   Pulse 65   Temp 97.8 F (36.6 C) (Oral)   Ht 5\' 4"  (1.626 m)   Wt 224 lb (101.6 kg)   SpO2 98%   BMI 38.45 kg/m  Constitutional: Well formed, well developed. No acute distress. Cardiovascular: Brisk cap refill Thorax & Lungs: No accessory muscle use Extremities: No clubbing. No cyanosis. No edema.  Skin: Warm. Dry. No erythema. No rash.  Musculoskeletal: R toe.   No edema Tenderness to palpation: yes, over dorsal aspect of IP joint Deformity: no Ecchymosis: no No pain when pressing on nail Neurologic: Normal sensory function.  Psychiatric: Normal mood. Age appropriate judgment and insight. Alert & oriented x 3.    Assessment:  Pain of right great toe - Plan: DG Foot Complete Right, methylPREDNISolone (MEDROL DOSEPAK) 4 MG TBPK tablet  Plan: Orders as above. XR neg. Steroid because of proclivity to bleed with Celebrex, has no recollection of being on naproxen, ibuprofen or other NSAIDs. Tylenol. Ice. Flat soled shoe given. Considering gout. Will get uric acid at next appt if no better vs consider referral to podiatry. F/u in 2 weeks, cancel appt if better. The patient voiced understanding and agreement to the plan.   [place], DO 11/28/16  12:58 PM

## 2016-12-04 ENCOUNTER — Other Ambulatory Visit (INDEPENDENT_AMBULATORY_CARE_PROVIDER_SITE_OTHER): Payer: Medicare Other

## 2016-12-04 ENCOUNTER — Encounter: Payer: Self-pay | Admitting: Family

## 2016-12-04 DIAGNOSIS — E785 Hyperlipidemia, unspecified: Secondary | ICD-10-CM | POA: Diagnosis not present

## 2016-12-04 LAB — LIPID PANEL
Cholesterol: 234 mg/dL — ABNORMAL HIGH (ref 0–200)
HDL: 52.4 mg/dL (ref >39.00)
LDL Cholesterol: 159 mg/dL — ABNORMAL HIGH (ref 0–99)
NonHDL: 181.77
Total CHOL/HDL Ratio: 4
Triglycerides: 113 mg/dL (ref 0.0–149.0)
VLDL: 22.6 mg/dL (ref 0.0–40.0)

## 2016-12-12 ENCOUNTER — Ambulatory Visit: Payer: Medicare Other | Admitting: Family Medicine

## 2016-12-14 ENCOUNTER — Ambulatory Visit (INDEPENDENT_AMBULATORY_CARE_PROVIDER_SITE_OTHER): Payer: Medicare Other | Admitting: Family Medicine

## 2016-12-14 ENCOUNTER — Ambulatory Visit (HOSPITAL_BASED_OUTPATIENT_CLINIC_OR_DEPARTMENT_OTHER): Payer: Medicare Other | Admitting: Hematology & Oncology

## 2016-12-14 ENCOUNTER — Other Ambulatory Visit (HOSPITAL_BASED_OUTPATIENT_CLINIC_OR_DEPARTMENT_OTHER): Payer: Medicare Other

## 2016-12-14 ENCOUNTER — Encounter: Payer: Self-pay | Admitting: Family Medicine

## 2016-12-14 VITALS — BP 128/80 | HR 65 | Temp 97.9°F | Ht 64.0 in | Wt 223.6 lb

## 2016-12-14 VITALS — BP 127/72 | HR 67 | Temp 98.4°F | Resp 18 | Wt 222.0 lb

## 2016-12-14 DIAGNOSIS — Z09 Encounter for follow-up examination after completed treatment for conditions other than malignant neoplasm: Secondary | ICD-10-CM

## 2016-12-14 DIAGNOSIS — D509 Iron deficiency anemia, unspecified: Secondary | ICD-10-CM | POA: Diagnosis not present

## 2016-12-14 DIAGNOSIS — D508 Other iron deficiency anemias: Secondary | ICD-10-CM

## 2016-12-14 DIAGNOSIS — L723 Sebaceous cyst: Secondary | ICD-10-CM | POA: Diagnosis not present

## 2016-12-14 DIAGNOSIS — D500 Iron deficiency anemia secondary to blood loss (chronic): Secondary | ICD-10-CM

## 2016-12-14 LAB — CBC WITH DIFFERENTIAL (CANCER CENTER ONLY)
BASO#: 0 10*3/uL (ref 0.0–0.2)
BASO%: 0.1 % (ref 0.0–2.0)
EOS%: 1.1 % (ref 0.0–7.0)
Eosinophils Absolute: 0.1 10*3/uL (ref 0.0–0.5)
HCT: 38.1 % (ref 34.8–46.6)
HGB: 12.1 g/dL (ref 11.6–15.9)
LYMPH#: 3.1 10*3/uL (ref 0.9–3.3)
LYMPH%: 42.4 % (ref 14.0–48.0)
MCH: 23.3 pg — ABNORMAL LOW (ref 26.0–34.0)
MCHC: 31.8 g/dL — ABNORMAL LOW (ref 32.0–36.0)
MCV: 73 fL — ABNORMAL LOW (ref 81–101)
MONO#: 0.6 10*3/uL (ref 0.1–0.9)
MONO%: 7.8 % (ref 0.0–13.0)
NEUT#: 3.6 10*3/uL (ref 1.5–6.5)
NEUT%: 48.6 % (ref 39.6–80.0)
Platelets: 208 10*3/uL (ref 145–400)
RBC: 5.2 10*6/uL (ref 3.70–5.32)
RDW: 14 % (ref 11.1–15.7)
WBC: 7.3 10*3/uL (ref 3.9–10.0)

## 2016-12-14 LAB — IRON AND TIBC
%SAT: 22 % (ref 21–57)
Iron: 58 ug/dL (ref 41–142)
TIBC: 265 ug/dL (ref 236–444)
UIBC: 207 ug/dL (ref 120–384)

## 2016-12-14 LAB — COMPREHENSIVE METABOLIC PANEL
ALT: 32 U/L (ref 0–55)
AST: 22 U/L (ref 5–34)
Albumin: 3.7 g/dL (ref 3.5–5.0)
Alkaline Phosphatase: 135 U/L (ref 40–150)
Anion Gap: 12 mEq/L — ABNORMAL HIGH (ref 3–11)
BUN: 11.4 mg/dL (ref 7.0–26.0)
CO2: 25 mEq/L (ref 22–29)
Calcium: 9.6 mg/dL (ref 8.4–10.4)
Chloride: 103 mEq/L (ref 98–109)
Creatinine: 0.9 mg/dL (ref 0.6–1.1)
EGFR: 87 mL/min/{1.73_m2} — ABNORMAL LOW (ref >90)
Glucose: 123 mg/dl (ref 70–140)
Potassium: 3.7 mEq/L (ref 3.5–5.1)
Sodium: 139 mEq/L (ref 136–145)
Total Bilirubin: 0.29 mg/dL (ref 0.20–1.20)
Total Protein: 7.6 g/dL (ref 6.4–8.3)

## 2016-12-14 LAB — FERRITIN: Ferritin: 1478 ng/ml — ABNORMAL HIGH (ref 9–269)

## 2016-12-14 NOTE — Progress Notes (Signed)
Chief Complaint  Patient presents with  . Follow-up    on foot-doing better   Pt states her foot is much better since coming in. She can walk and is pain free. Still unsure what the cause was.   BP 128/80 (BP Location: Left Arm, Patient Position: Sitting, Cuff Size: Normal)   Pulse 65   Temp 97.9 F (36.6 C) (Oral)   Ht 5\' 4"  (1.626 m)   Wt 223 lb 9.6 oz (101.4 kg)   SpO2 98%   BMI 38.38 kg/m  Gen- awake, alert, appeared stated age MSK- R foot without deformity or edema. No TTP over R MTP or IP, no bony tenderness. Psych- Age appropriate judgment and insight  Follow-up for resolved condition  Foot pain resolved.  F/u prn. Pt voiced understanding and agreement to the plan.   Glen Campbell, DO 12/14/16 10:40 AM

## 2016-12-14 NOTE — Progress Notes (Signed)
Hematology and Oncology Follow Up Visit  [name] [id number] [birth year] 56 y.o. 12/14/2016   Principle Diagnosis:  Recurrent iron deficiency anemia  Current Therapy:   IV iron as indicated - last dose in May 2017    Interim History: [name] is here today for follow-up. She is doing well. She comes in with her grandson.  Her last iron was done over a year ago.  We saw her in February, her ferritin was 1000. Her iron saturation was 42%. A lot of the ferritin elevation is from arthritic issues. She's having a lot of problems with her left knee. She's had surgery for this on several occasions. She probably will have to go back to see her orthopedist.   She has had no problems with bleeding. There's no change in bowel or bladder habits. She's had no rashes.   She does have a sebaceous cyst next to her left ear. I opened this up a little bit. She may need to ask should have this surgically excised if it keeps coming back.   She's had no fever. She's had no infections.   Her appetite is doing okay.   Overall, her performance status is ECOG 1.  Medications:  Allergies as of 12/14/2016      Reactions   Ace Inhibitors    REACTION: chronic cough   Celebrex [celecoxib] Other (See Comments)   "makes me bleed"   Diovan [valsartan]    angioedema      Medication List       Accurate as of 12/14/16  9:52 AM. Always use your most recent med list.          amitriptyline 25 MG tablet Commonly known as:  ELAVIL Take 1 tablet (25 mg total) by mouth at bedtime.   amLODipine 5 MG tablet Commonly known as:  NORVASC Take 1 tablet (5 mg total) by mouth daily.   aspirin EC 81 MG tablet Take 81 mg by mouth daily.   hydrochlorothiazide 25 MG tablet Commonly known as:  HYDRODIURIL Take 1 tablet (25 mg total) by mouth daily.   methylPREDNISolone 4 MG Tbpk tablet Commonly known as:  MEDROL DOSEPAK Follow instructions on package.   potassium chloride SA 20 MEQ tablet Commonly  known as:  K-DUR,KLOR-CON Take 1 tablet (20 mEq total) by mouth daily.   promethazine 25 MG suppository Commonly known as:  PHENADOZ INSERT ONE SUPPOSITORY RECTALLY AS NEEDED   rosuvastatin 40 MG tablet Commonly known as:  CRESTOR Take 1 tablet (40 mg total) by mouth every other day.   SUMAtriptan 50 MG tablet Commonly known as:  IMITREX May repeat in 2 hours if headache not resolved. Max 2 tabs/24 hrs       Allergies:  Allergies  Allergen Reactions  . Ace Inhibitors     REACTION: chronic cough  . Celebrex [Celecoxib] Other (See Comments)    "makes me bleed"  . Diovan [Valsartan]     angioedema    Past Medical History, Surgical history, Social history, and Family History were reviewed and updated.  Review of Systems: All other 10 point review of systems is negative.   Physical Exam:  weight is 222 lb (100.7 kg). Her oral temperature is 98.4 F (36.9 C). Her blood pressure is 127/72 and her pulse is 67. Her respiration is 18 and oxygen saturation is 98%.   Wt Readings from Last 3 Encounters:  12/14/16 222 lb (100.7 kg)  11/28/16 224 lb (101.6 kg)  10/19/16 223 lb 9.6 oz (  101.4 kg)    OWell-developed and well-nourished Afro-American female. Head and neck exam shows no ocular or oral lesions. She has no adenopathy in her neck. Thyroid is nonpalpable. Lungs are clear. Cardiac exam regular rate and rhythm with no murmurs rubs or bruits. Abdomen is soft. She has good bowel sounds. There is no fluid wave. There is no palpable liver or spleen tip. Back exam shows no tenderness over the spine ribs or hips. Extremities shows no clubbing cyanosis or edema. Skin exam shows these papular lesions on her extremities. Neurological exam is nonfocal.  Lab Results  Component Value Date   WBC 7.3 12/14/2016   HGB 12.1 12/14/2016   HCT 38.1 12/14/2016   MCV 73 (L) 12/14/2016   PLT 208 12/14/2016   Lab Results  Component Value Date   FERRITIN 1,045 (H) 08/13/2016   IRON 108  08/13/2016   TIBC 256 08/13/2016   UIBC 148 08/13/2016   IRONPCTSAT 42 08/13/2016   Lab Results  Component Value Date   RETICCTPCT 1.0 04/08/2015   RBC 5.20 12/14/2016   RETICCTABS 48.5 04/08/2015   No results found for: Nils Pyle [facility] Lab Results  Component Value Date   IGA 305 05/04/2009   Lab Results  Component Value Date   TOTALPROTELP 7.2 05/04/2009   ALBUMINELP 49.1 (L) 05/04/2009   A1GS 8.3 (H) 05/04/2009   A2GS 14.1 (H) 05/04/2009   BETS 6.6 05/04/2009   GAMS 16.4 05/04/2009   MSPIKE NOT DET 05/04/2009     Chemistry      Component Value Date/Time   NA 140 10/31/2016 1555   NA 140 08/13/2016 0742   K 3.7 10/31/2016 1555   K 3.5 08/13/2016 0742   CL 103 10/31/2016 1555   CL 99 08/16/2015 1055   CO2 27 10/31/2016 1555   CO2 27 08/13/2016 0742   BUN 13 10/31/2016 1555   BUN 14.6 08/13/2016 0742   CREATININE 0.77 10/31/2016 1555   CREATININE 1.05 10/19/2016 1537   CREATININE 0.8 08/13/2016 0742      Component Value Date/Time   CALCIUM 9.5 10/31/2016 1555   CALCIUM 9.7 08/13/2016 0742   ALKPHOS 146 08/13/2016 0742   AST 17 08/13/2016 0742   ALT 26 08/13/2016 0742   BILITOT <0.22 08/13/2016 0742     Impression and Plan: [name] is a very pleasant 56 yo African American female with recurrent iron deficiency anemia.  Her blood count is holding up nicely study right now.  We'll see what her iron studies show. I would think that they should be okay.  We will plan to get her back in 4 months. I think this would be a good time frame for follow-up.  [name], MD 6/22/20189:52 AM

## 2016-12-14 NOTE — Patient Instructions (Signed)
Let us know if you need anything.  

## 2016-12-15 LAB — RETICULOCYTES: Reticulocyte Count: 1 % (ref 0.6–2.6)

## 2017-01-07 ENCOUNTER — Emergency Department (HOSPITAL_BASED_OUTPATIENT_CLINIC_OR_DEPARTMENT_OTHER)
Admission: EM | Admit: 2017-01-07 | Discharge: 2017-01-07 | Disposition: A | Discharge status: Home / Self Care | Payer: Medicare Other | Attending: Emergency Medicine | Admitting: Emergency Medicine

## 2017-01-07 ENCOUNTER — Encounter (HOSPITAL_BASED_OUTPATIENT_CLINIC_OR_DEPARTMENT_OTHER): Payer: Self-pay | Admitting: *Deleted

## 2017-01-07 DIAGNOSIS — Z79899 Other long term (current) drug therapy: Secondary | ICD-10-CM | POA: Diagnosis not present

## 2017-01-07 DIAGNOSIS — Y939 Activity, unspecified: Secondary | ICD-10-CM | POA: Insufficient documentation

## 2017-01-07 DIAGNOSIS — Y929 Unspecified place or not applicable: Secondary | ICD-10-CM | POA: Diagnosis not present

## 2017-01-07 DIAGNOSIS — S99921A Unspecified injury of right foot, initial encounter: Secondary | ICD-10-CM | POA: Diagnosis present

## 2017-01-07 DIAGNOSIS — S91209A Unspecified open wound of unspecified toe(s) with damage to nail, initial encounter: Secondary | ICD-10-CM | POA: Diagnosis not present

## 2017-01-07 DIAGNOSIS — E119 Type 2 diabetes mellitus without complications: Secondary | ICD-10-CM | POA: Insufficient documentation

## 2017-01-07 DIAGNOSIS — Y33XXXA Other specified events, undetermined intent, initial encounter: Secondary | ICD-10-CM | POA: Diagnosis not present

## 2017-01-07 DIAGNOSIS — I10 Essential (primary) hypertension: Secondary | ICD-10-CM | POA: Diagnosis not present

## 2017-01-07 DIAGNOSIS — Y999 Unspecified external cause status: Secondary | ICD-10-CM | POA: Insufficient documentation

## 2017-01-07 DIAGNOSIS — Z7982 Long term (current) use of aspirin: Secondary | ICD-10-CM | POA: Diagnosis not present

## 2017-01-07 DIAGNOSIS — Z87891 Personal history of nicotine dependence: Secondary | ICD-10-CM | POA: Diagnosis not present

## 2017-01-07 NOTE — ED Triage Notes (Signed)
Her right great toenail is coming off.

## 2017-01-07 NOTE — Discharge Instructions (Signed)
Please follow up with Dr. Amalia Hailey for further management of toe

## 2017-01-07 NOTE — ED Provider Notes (Signed)
[facility] Provider Note   CSN: [id number] Arrival date & time: 01/07/17  1950  By signing my name below, I, Collene Leyden, attest that this documentation has been prepared under the direction and in the presence of Janetta Hora, PA-C.  Electronically Signed: Collene Leyden, Scribe. 01/07/17. 9:21 PM.  History   Chief Complaint Chief Complaint  Patient presents with  . Toe Pain   HPI Comments: [name] is a 56 y.o. female who presents to the Emergency Department complaining of sudden-onset right great toe nail pain that began earlier today. Patient states her right great toenail is beginning to come off. Patient reports being evaluated recently for right toe pain and was given prednisone for her symptoms which got better. She had xrays done at that time and a "blister" formed under her toe nail which went away. Patient denies any history of nail infection or trauma. No associated symptoms noted. No medications taken prior to arrival.  Patient denies any fever, chills, nausea, vomiting or any additional symptoms.   The history is provided by the patient. No language interpreter was used.    Past Medical History:  Diagnosis Date  . Allergy    allergic rhinitis  . Atypical chest pain   . Constipation   . Cyst, ovarian   . Diabetes mellitus, type II ([place])   . Endometriosis   . Esophageal stricture   . Gastroparesis   . GERD (gastroesophageal reflux disease)   . Hyperlipidemia   . Hypertension   . 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 Iron deficiency anemia   . Migraine headache   . Non-compliance   . Peptic ulcer disease     Patient Active Problem List   Diagnosis Date Noted  . Hyperglycemia 10/07/2014  . HTN (hypertension) 09/18/2012  . Breast cyst 06/09/2012  . Hyperthyroidism 02/29/2012  . Unspecified constipation 04/18/2011  . Can't get food down 04/18/2011  . Nausea with vomiting 04/18/2011    . Ulnar neuropathy 03/30/2011  . Low back pain 10/18/2010  . FIBROMYALGIA 07/20/2009  . Hyperlipidemia 05/26/2009  . VITAMIN D DEFICIENCY 05/23/2009  . Iron deficiency anemia due to chronic blood loss 10/25/2008  . GERD 10/25/2008  . LIVER HEMANGIOMA 01/01/2008  . ESOPHAGEAL STRICTURE 01/01/2008  . GASTROPARESIS 01/01/2008  . ALLERGIC RHINITIS 12/08/2007  . Borderline type 2 diabetes mellitus 10/31/2007  . Migraine headache 08/11/2007  . HIATAL HERNIA 08/11/2007  . OVARIAN CYST 08/11/2007  . Essential hypertension, benign 05/06/2007    Past Surgical History:  Procedure Laterality Date  . ABDOMINAL EXPLORATION SURGERY     w/bso   . CARDIAC CATHETERIZATION  2009   mild non obstructive CAD  . CHOLECYSTECTOMY    . KNEE SURGERY  2005    left knee  . TOTAL ABDOMINAL HYSTERECTOMY      OB History    No data available       Home Medications    Prior to Admission medications   Medication Sig Start Date End Date Taking? Authorizing Provider  amitriptyline (ELAVIL) 25 MG tablet Take 1 tablet (25 mg total) by mouth at bedtime. 10/19/16   [name], NP  amLODipine (NORVASC) 5 MG tablet Take 1 tablet (5 mg total) by mouth daily. 10/19/16   [name], NP  aspirin EC 81 MG tablet Take 81 mg by mouth daily.    [provider]  hydrochlorothiazide (HYDRODIURIL) 25 MG tablet Take 1 tablet (25 mg total) by  mouth daily. 10/19/16   [name], NP  methylPREDNISolone (MEDROL DOSEPAK) 4 MG TBPK tablet Follow instructions on package. 11/28/16   Shelda Pal, DO  potassium chloride SA (K-DUR,KLOR-CON) 20 MEQ tablet Take 1 tablet (20 mEq total) by mouth daily. 10/23/16   [name], NP  promethazine (PHENADOZ) 25 MG suppository INSERT ONE SUPPOSITORY RECTALLY AS NEEDED 07/26/15   [name], NP  rosuvastatin (CRESTOR) 40 MG tablet Take 1 tablet (40 mg total) by mouth every other day. 10/23/16   [name], NP  SUMAtriptan  (IMITREX) 50 MG tablet May repeat in 2 hours if headache not resolved. Max 2 tabs/24 hrs Patient taking differently: Take 1 tablet at onset of migraine. May repeat in 2 hours if headache not resolved. Max 2 tabs/24 hrs. 10/19/16   [name], NP    Family History Family History  Problem Relation Age of Onset  . Hypertension Mother   . Rheum arthritis Mother   . Hypertension Father   . Diabetes Father   . Prostate cancer Father   . Kidney disease Father   . Diabetes Sister   . Fibromyalgia Sister   . Diabetes Maternal Grandmother   . Lung cancer Maternal Grandfather   . Colon cancer Neg Hx     Social History Social History  Substance Use Topics  . Smoking status: Former Smoker    Packs/day: 0.50    Years: 18.00    Types: Cigarettes    Start date: 07/29/1977    Quit date: 06/25/1994  . Smokeless tobacco: Never Used     Comment: quit 19 years ago  . Alcohol use 0.0 oz/week     Comment: social drinker     Allergies   Ace inhibitors; Celebrex [celecoxib]; and Diovan [valsartan]   Review of Systems Review of Systems  Constitutional: Negative for chills and fever.  Gastrointestinal: Negative for nausea and vomiting.  Musculoskeletal:       Toenail detachment.      Physical Exam Updated Vital Signs BP (!) 129/93   Pulse 79   Temp 98.3 F (36.8 C) (Oral)   Resp 20   Ht 5\' 4"  (1.626 m)   Wt 223 lb (101.2 kg)   SpO2 99%   BMI 38.28 kg/m   Physical Exam  Constitutional: She is oriented to person, place, and time. She appears well-developed and well-nourished. No distress.  HENT:  Head: Normocephalic and atraumatic.  Eyes: Pupils are equal, round, and reactive to light. Conjunctivae and EOM are normal. Right eye exhibits no discharge. Left eye exhibits no discharge. No scleral icterus.  Neck: Normal range of motion. Neck supple.  Cardiovascular: Normal rate.   Pulmonary/Chest: Effort normal. No respiratory distress.  Abdominal: She exhibits no distension.    Musculoskeletal: Normal range of motion.  Great toe nail is painted, but appears to be attached only on the medial side of the nail bed.   Neurological: She is alert and oriented to person, place, and time.  Skin: Skin is warm and dry.  Psychiatric: She has a normal mood and affect. Her behavior is normal.  Nursing note and vitals reviewed.    ED Treatments / Results  DIAGNOSTIC STUDIES: Oxygen Saturation is 99% on RA, normal by my interpretation.    COORDINATION OF CARE: 9:21 PM Discussed treatment plan with pt at bedside and pt agreed to plan, which includes toenail removal.   Labs (all labs ordered are listed, but only abnormal results are displayed) Labs Reviewed - No data to display  EKG  EKG Interpretation None       Radiology No results found.  Procedures .Nail Removal Date/Time: 01/08/2017 3:47 PM Performed by: Janetta Hora MARIE Authorized by: Janetta Hora MARIE   Consent:    Consent obtained:  Verbal   Consent given by:  Patient   Risks discussed:  Bleeding, incomplete removal and pain   Alternatives discussed:  No treatment and referral Location:    Foot:  R big toe Pre-procedure details:    Skin preparation:  Betadine   Preparation: Patient was prepped and draped in the usual sterile fashion   Anesthesia (see MAR for exact dosages):    Anesthesia method:  Nerve block   Block needle gauge:  25 G   Block anesthetic:  Lidocaine 1% w/o epi   Block technique:  Toe digital block   Block injection procedure:  Anatomic landmarks identified   Block outcome:  Anesthesia achieved Nail Removal:    Nail removed:  Complete   Nail bed repaired: no   Trephination:    Subungual hematoma drained: no   Post-procedure details:    Dressing:  4x4 sterile gauze   Patient tolerance of procedure:  Tolerated well, no immediate complications Comments:     Toe nail and nail bed had a green appearance consistent with fungal infection   (including critical care  time)  Medications Ordered in ED Medications - No data to display   Initial Impression / Assessment and Plan / ED Course  I have reviewed the triage vital signs and the nursing notes.  Pertinent labs & imaging results that were available during my care of the patient were reviewed by me and considered in my medical decision making (see chart for details).  56 year old with atraumatic partial toe nail avulsion most likely due to underlying fungal infection. Digital block was performed and nail was completely removed. She was advised to f/u with podiatry. Return precautions were given.  Final Clinical Impressions(s) / ED Diagnoses   Final diagnoses:  Nail avulsion of toe, initial encounter    New Prescriptions New Prescriptions   No medications on file   I personally performed the services described in this documentation, which was scribed in my presence. The recorded information has been reviewed and is accurate.    Recardo Evangelist, PA-C 01/08/17 1550    Lajean Saver, MD 01/09/17 1300

## 2017-01-11 ENCOUNTER — Encounter: Payer: Self-pay | Admitting: Family

## 2017-01-11 ENCOUNTER — Ambulatory Visit (INDEPENDENT_AMBULATORY_CARE_PROVIDER_SITE_OTHER): Payer: Medicare Other | Admitting: Family

## 2017-01-11 VITALS — BP 127/77 | HR 84 | Temp 98.4°F | Resp 16 | Ht 64.0 in | Wt 225.6 lb

## 2017-01-11 DIAGNOSIS — G4700 Insomnia, unspecified: Secondary | ICD-10-CM

## 2017-01-11 DIAGNOSIS — L609 Nail disorder, unspecified: Secondary | ICD-10-CM | POA: Diagnosis not present

## 2017-01-11 DIAGNOSIS — R5383 Other fatigue: Secondary | ICD-10-CM

## 2017-01-11 LAB — COMPREHENSIVE METABOLIC PANEL WITH GFR
ALT: 21 U/L (ref 0–35)
AST: 17 U/L (ref 0–37)
Albumin: 3.9 g/dL (ref 3.5–5.2)
Alkaline Phosphatase: 117 U/L (ref 39–117)
BUN: 10 mg/dL (ref 6–23)
CO2: 30 meq/L (ref 19–32)
Calcium: 9.5 mg/dL (ref 8.4–10.5)
Chloride: 103 meq/L (ref 96–112)
Creatinine, Ser: 0.74 mg/dL (ref 0.40–1.20)
GFR: 103.55 mL/min
Glucose, Bld: 100 mg/dL — ABNORMAL HIGH (ref 70–99)
Potassium: 3.5 meq/L (ref 3.5–5.1)
Sodium: 139 meq/L (ref 135–145)
Total Bilirubin: 0.3 mg/dL (ref 0.2–1.2)
Total Protein: 7.3 g/dL (ref 6.0–8.3)

## 2017-01-11 LAB — CBC WITH DIFFERENTIAL/PLATELET
Basophils Absolute: 0 10*3/uL (ref 0.0–0.1)
Basophils Relative: 0.3 % (ref 0.0–3.0)
Eosinophils Absolute: 0.1 10*3/uL (ref 0.0–0.7)
Eosinophils Relative: 1 % (ref 0.0–5.0)
HCT: 37.4 % (ref 36.0–46.0)
Hemoglobin: 11.6 g/dL — ABNORMAL LOW (ref 12.0–15.0)
Lymphocytes Relative: 36.8 % (ref 12.0–46.0)
Lymphs Abs: 2.6 10*3/uL (ref 0.7–4.0)
MCHC: 31 g/dL (ref 30.0–36.0)
MCV: 74 fl — ABNORMAL LOW (ref 78.0–100.0)
Monocytes Absolute: 0.5 10*3/uL (ref 0.1–1.0)
Monocytes Relative: 7.3 % (ref 3.0–12.0)
Neutro Abs: 3.8 10*3/uL (ref 1.4–7.7)
Neutrophils Relative %: 54.6 % (ref 43.0–77.0)
Platelets: 222 10*3/uL (ref 150.0–400.0)
RBC: 5.05 Mil/uL (ref 3.87–5.11)
RDW: 14 % (ref 11.5–15.5)
WBC: 6.9 10*3/uL (ref 4.0–10.5)

## 2017-01-11 LAB — TSH: TSH: 0.3 u[IU]/mL — ABNORMAL LOW (ref 0.35–4.50)

## 2017-01-11 MED ORDER — TRAZODONE HCL 50 MG PO TABS: 25.0000 mg | ORAL_TABLET | Freq: Every evening | ORAL | 3 refills | Status: DC | PRN

## 2017-01-11 NOTE — Patient Instructions (Addendum)
Please complete lab work prior to leaving. Add trazodone 1/2-1 tablet at bedtime for sleep. You will be contacted about scheduling your referral to the foot doctor.

## 2017-01-11 NOTE — Progress Notes (Signed)
Subjective:    Patient ID: [name], female    DOB: 1960/12/05, 56 y.o.   MRN: [id number]  HPI  [name] is a 56 yr old patient who presents today for ED follow up.  Was evaluated in the ED and had her right great toenail removed. Denies current pain or swelling.  She does complain today of increased fatigue. Reports that she has a lot of difficulty falling asleep.  Review of Systems    see HPI  Past Medical History:  Diagnosis Date  . Allergy    allergic rhinitis  . Atypical chest pain   . Constipation   . Cyst, ovarian   . Diabetes mellitus, type II ([place])   . Endometriosis   . Esophageal stricture   . Gastroparesis   . GERD (gastroesophageal reflux disease)   . Hyperlipidemia   . Hypertension   . 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 Iron deficiency anemia   . Migraine headache   . Non-compliance   . Peptic ulcer disease      Social History   Social History  . Marital status: Married    Spouse name: N/A  . Number of children: 2  . Years of education: N/A   Occupational History  . DISABILITY Unemployed   Social History Main Topics  . Smoking status: Former Smoker    Packs/day: 0.50    Years: 18.00    Types: Cigarettes    Start date: 07/29/1977    Quit date: 06/25/1994  . Smokeless tobacco: Never Used     Comment: quit 19 years ago  . Alcohol use 0.0 oz/week     Comment: social drinker  . Drug use: No  . Sexual activity: Not on file   Other Topics Concern  . Not on file   Social History Narrative   Married    Has 2 grown children.  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   Former Smoker Quit tobacco in 1996.  She was a pack a day smoker for approximately 10 years.   Alcohol use-yes: Social    Daily Caffeine Use:6 pack of pepsi daily     Illicit Drug Use - no    Patient does not get regular exercise.       Smoking Status:  quit    Past Surgical History:  Procedure Laterality Date    . ABDOMINAL EXPLORATION SURGERY     w/bso   . CARDIAC CATHETERIZATION  2009   mild non obstructive CAD  . CHOLECYSTECTOMY    . KNEE SURGERY  2005    left knee  . TOTAL ABDOMINAL HYSTERECTOMY      Family History  Problem Relation Age of Onset  . Hypertension Mother   . Rheum arthritis Mother   . Hypertension Father   . Diabetes Father   . Prostate cancer Father   . Kidney disease Father   . Diabetes Sister   . Fibromyalgia Sister   . Diabetes Maternal Grandmother   . Lung cancer Maternal Grandfather   . Colon cancer Neg Hx     Allergies  Allergen Reactions  . Ace Inhibitors     REACTION: chronic cough  . Celebrex [Celecoxib] Other (See Comments)    "makes me bleed"  . Diovan [Valsartan]     angioedema    Current Outpatient Prescriptions on File Prior to Visit  Medication Sig Dispense Refill  . amitriptyline (ELAVIL) 25 MG tablet Take 1 tablet (  25 mg total) by mouth at bedtime. 90 tablet 1  . amLODipine (NORVASC) 5 MG tablet Take 1 tablet (5 mg total) by mouth daily. 90 tablet 1  . aspirin EC 81 MG tablet Take 81 mg by mouth daily.    . hydrochlorothiazide (HYDRODIURIL) 25 MG tablet Take 1 tablet (25 mg total) by mouth daily. 90 tablet 0  . methylPREDNISolone (MEDROL DOSEPAK) 4 MG TBPK tablet Follow instructions on package. 21 tablet 0  . potassium chloride SA (K-DUR,KLOR-CON) 20 MEQ tablet Take 1 tablet (20 mEq total) by mouth daily. 30 tablet 3  . promethazine (PHENADOZ) 25 MG suppository INSERT ONE SUPPOSITORY RECTALLY AS NEEDED 12 suppository 0  . rosuvastatin (CRESTOR) 40 MG tablet Take 1 tablet (40 mg total) by mouth every other day. 15 tablet 1  . SUMAtriptan (IMITREX) 50 MG tablet May repeat in 2 hours if headache not resolved. Max 2 tabs/24 hrs (Patient taking differently: Take 1 tablet at onset of migraine. May repeat in 2 hours if headache not resolved. Max 2 tabs/24 hrs.) 10 tablet 2  . [DISCONTINUED] atorvastatin (LIPITOR) 40 MG tablet Take 1 tablet (40 mg  total) by mouth daily. 30 tablet 3  . [DISCONTINUED] famotidine (PEPCID) 20 MG tablet Take 1 tablet (20 mg total) by mouth 2 (two) times daily. 30 tablet 0   No current facility-administered medications on file prior to visit.     BP 127/77 (BP Location: Right Arm, Cuff Size: Large)   Pulse 84   Temp 98.4 F (36.9 C) (Oral)   Resp 16   Ht 5\' 4"  (1.626 m)   Wt 225 lb 9.6 oz (102.3 kg)   SpO2 98%   BMI 38.72 kg/m    Objective:   Physical Exam  Constitutional: She appears well-developed and well-nourished.  Cardiovascular: Normal rate, regular rhythm and normal heart sounds.   No murmur heard. Pulmonary/Chest: Effort normal and breath sounds normal. No respiratory distress. She has no wheezes.  Skin:  Right great toenail is absent. No erythema or swelling of nailbed noted.    Psychiatric: She has a normal mood and affect. Her behavior is normal. Judgment and thought content normal.          Assessment & Plan:  Nail problem- I suspect she may have had a toenail fungus. Will refer to podiatry for further evaluation.  Fatigue- check cmet, cbc, tsh.  Insomnia- trial of trazodone.

## 2017-01-12 ENCOUNTER — Telehealth: Payer: Self-pay | Admitting: Family

## 2017-01-12 DIAGNOSIS — G4700 Insomnia, unspecified: Secondary | ICD-10-CM | POA: Insufficient documentation

## 2017-01-12 NOTE — Telephone Encounter (Signed)
See result note.  

## 2017-01-21 ENCOUNTER — Other Ambulatory Visit (INDEPENDENT_AMBULATORY_CARE_PROVIDER_SITE_OTHER): Payer: Medicare Other

## 2017-01-21 DIAGNOSIS — E0590 Thyrotoxicosis, unspecified without thyrotoxic crisis or storm: Secondary | ICD-10-CM | POA: Diagnosis not present

## 2017-01-21 DIAGNOSIS — D649 Anemia, unspecified: Secondary | ICD-10-CM

## 2017-01-21 LAB — IRON: Iron: 57 ug/dL (ref 42–145)

## 2017-01-21 LAB — T3, FREE: T3, Free: 3.2 pg/mL (ref 2.3–4.2)

## 2017-01-21 LAB — T4, FREE: Free T4: 0.85 ng/dL (ref 0.60–1.60)

## 2017-01-29 ENCOUNTER — Encounter: Payer: Self-pay | Admitting: Family

## 2017-02-04 ENCOUNTER — Telehealth: Payer: Self-pay | Admitting: Family

## 2017-02-04 ENCOUNTER — Encounter: Payer: Self-pay | Admitting: Family

## 2017-02-04 ENCOUNTER — Ambulatory Visit (INDEPENDENT_AMBULATORY_CARE_PROVIDER_SITE_OTHER): Payer: Medicare Other | Admitting: Family

## 2017-02-04 VITALS — BP 122/82 | HR 63 | Temp 98.7°F | Resp 18 | Ht 64.0 in | Wt 223.8 lb

## 2017-02-04 DIAGNOSIS — K625 Hemorrhage of anus and rectum: Secondary | ICD-10-CM | POA: Diagnosis not present

## 2017-02-04 DIAGNOSIS — E0590 Thyrotoxicosis, unspecified without thyrotoxic crisis or storm: Secondary | ICD-10-CM | POA: Diagnosis not present

## 2017-02-04 LAB — CBC WITH DIFFERENTIAL/PLATELET
Basophils Absolute: 0 10*3/uL (ref 0.0–0.1)
Basophils Relative: 0.3 % (ref 0.0–3.0)
Eosinophils Absolute: 0.1 10*3/uL (ref 0.0–0.7)
Eosinophils Relative: 1 % (ref 0.0–5.0)
HCT: 36.2 % (ref 36.0–46.0)
Hemoglobin: 11.4 g/dL — ABNORMAL LOW (ref 12.0–15.0)
Lymphocytes Relative: 42.4 % (ref 12.0–46.0)
Lymphs Abs: 2.9 10*3/uL (ref 0.7–4.0)
MCHC: 31.4 g/dL (ref 30.0–36.0)
MCV: 72.6 fl — ABNORMAL LOW (ref 78.0–100.0)
Monocytes Absolute: 0.4 10*3/uL (ref 0.1–1.0)
Monocytes Relative: 6 % (ref 3.0–12.0)
Neutro Abs: 3.4 10*3/uL (ref 1.4–7.7)
Neutrophils Relative %: 50.3 % (ref 43.0–77.0)
Platelets: 229 10*3/uL (ref 150.0–400.0)
RBC: 4.99 Mil/uL (ref 3.87–5.11)
RDW: 13.8 % (ref 11.5–15.5)
WBC: 6.8 10*3/uL (ref 4.0–10.5)

## 2017-02-04 MED ORDER — POTASSIUM CHLORIDE CRYS ER 20 MEQ PO TBCR: 20.0000 meq | EXTENDED_RELEASE_TABLET | Freq: Every day | ORAL | 5 refills | Status: DC

## 2017-02-04 NOTE — Telephone Encounter (Signed)
Pt called in stating that someone called her. Not showing a note in chart to RTN call. Informed pt that I will send message to assist to call IF she called.

## 2017-02-04 NOTE — Progress Notes (Signed)
Subjective:    Patient ID: [name], female    DOB: 05-May-1961, 56 y.o.   MRN: [id number]  HPI  [name] is a 56 yr old female who presents today with chief complaint of 1 month hx of blood in stools. Reports that she does not have blood with every bowel movement tends to happen more with straining.  Denies SOB, rectal pain or hemorrhoids that she is aware of.   The patient does have a history of iron deficiency anemia. She did have an endoscopy performed back in 2010 which noted mild gastritis questionable patchy inflammation of the small bowel. Colonoscopy which was also performed in 2010 was positive for polyp which was not found to be adenomatous.  Lab Results  Component Value Date   WBC 6.9 01/11/2017   HGB 11.6 (L) 01/11/2017   HCT 37.4 01/11/2017   MCV 74.0 (L) 01/11/2017   PLT 222.0 01/11/2017   Reports sleep is improved with elavil. Wakes up at 2 or 3 PM.    Review of Systems See HPI  Past Medical History:  Diagnosis Date  . Allergy    allergic rhinitis  . Atypical chest pain   . Constipation   . Cyst, ovarian   . Diabetes mellitus, type II ([place])   . Endometriosis   . Esophageal stricture   . Gastroparesis   . GERD (gastroesophageal reflux disease)   . Hyperlipidemia   . Hypertension   . 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 Iron deficiency anemia   . Migraine headache   . Non-compliance   . Peptic ulcer disease      Social History   Social History  . Marital status: Married    Spouse name: N/A  . Number of children: 2  . Years of education: N/A   Occupational History  . DISABILITY Unemployed   Social History Main Topics  . Smoking status: Former Smoker    Packs/day: 0.50    Years: 18.00    Types: Cigarettes    Start date: 07/29/1977    Quit date: 06/25/1994  . Smokeless tobacco: Never Used     Comment: quit 19 years ago  . Alcohol use 0.0 oz/week     Comment: social drinker  .  Drug use: No  . Sexual activity: Not on file   Other Topics Concern  . Not on file   Social History Narrative   Married    Has 2 grown children.  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   Former Smoker Quit tobacco in 1996.  She was a pack a day smoker for approximately 10 years.   Alcohol use-yes: Social    Daily Caffeine Use:6 pack of pepsi daily     Illicit Drug Use - no    Patient does not get regular exercise.       Smoking Status:  quit    Past Surgical History:  Procedure Laterality Date  . ABDOMINAL EXPLORATION SURGERY     w/bso   . CARDIAC CATHETERIZATION  2009   mild non obstructive CAD  . CHOLECYSTECTOMY    . KNEE SURGERY  2005    left knee  . TOTAL ABDOMINAL HYSTERECTOMY      Family History  Problem Relation Age of Onset  . Hypertension Mother   . Rheum arthritis Mother   . Hypertension Father   . Diabetes Father   . Prostate cancer Father   . Kidney disease Father   .  Diabetes Sister   . Fibromyalgia Sister   . Diabetes Maternal Grandmother   . Lung cancer Maternal Grandfather   . Colon cancer Neg Hx     Allergies  Allergen Reactions  . Ace Inhibitors     REACTION: chronic cough  . Celebrex [Celecoxib] Other (See Comments)    "makes me bleed"  . Diovan [Valsartan]     angioedema    Current Outpatient Prescriptions on File Prior to Visit  Medication Sig Dispense Refill  . amitriptyline (ELAVIL) 25 MG tablet Take 1 tablet (25 mg total) by mouth at bedtime. 90 tablet 1  . amLODipine (NORVASC) 5 MG tablet Take 1 tablet (5 mg total) by mouth daily. 90 tablet 1  . aspirin EC 81 MG tablet Take 81 mg by mouth daily.    . hydrochlorothiazide (HYDRODIURIL) 25 MG tablet Take 1 tablet (25 mg total) by mouth daily. 90 tablet 0  . potassium chloride SA (K-DUR,KLOR-CON) 20 MEQ tablet Take 1 tablet (20 mEq total) by mouth daily. 30 tablet 3  . promethazine (PHENADOZ) 25 MG suppository INSERT ONE SUPPOSITORY RECTALLY AS NEEDED 12 suppository 0  . rosuvastatin  (CRESTOR) 40 MG tablet Take 1 tablet (40 mg total) by mouth every other day. 15 tablet 1  . SUMAtriptan (IMITREX) 50 MG tablet May repeat in 2 hours if headache not resolved. Max 2 tabs/24 hrs (Patient taking differently: Take 1 tablet at onset of migraine. May repeat in 2 hours if headache not resolved. Max 2 tabs/24 hrs.) 10 tablet 2  . traZODone (DESYREL) 50 MG tablet Take 0.5-1 tablets (25-50 mg total) by mouth at bedtime as needed for sleep. 30 tablet 3  . [DISCONTINUED] atorvastatin (LIPITOR) 40 MG tablet Take 1 tablet (40 mg total) by mouth daily. 30 tablet 3  . [DISCONTINUED] famotidine (PEPCID) 20 MG tablet Take 1 tablet (20 mg total) by mouth 2 (two) times daily. 30 tablet 0   No current facility-administered medications on file prior to visit.     BP 122/82 (BP Location: Left Arm, Cuff Size: Large)   Pulse 63   Temp 98.7 F (37.1 C) (Oral)   Resp 18   Ht 5\' 4"  (1.626 m)   Wt 223 lb 12.8 oz (101.5 kg)   SpO2 99%   BMI 38.42 kg/m       Objective:   Physical Exam  Constitutional: She is oriented to person, place, and time. She appears well-developed and well-nourished.  HENT:  Head: Normocephalic and atraumatic.  Cardiovascular: Normal rate, regular rhythm and normal heart sounds.   No murmur heard. Pulmonary/Chest: Effort normal and breath sounds normal. No respiratory distress. She has no wheezes.  Genitourinary: Rectal exam shows guaiac negative stool.  Genitourinary Comments: Normal rectal exam   Musculoskeletal: She exhibits no edema.  Neurological: She is alert and oriented to person, place, and time.  Psychiatric: She has a normal mood and affect. Her behavior is normal. Judgment and thought content normal.          Assessment & Plan:  Rectal bleeding-new.  will refer to gastroenterology for further evaluation. She is heme-negative today in the office. Suspect hemorrhoidal bleed perhaps from some internal hemorrhoids. Advised patient that should she have  multiple bloody stools in the same day, shortness of breath, or weakness she should go to the ER. Patient verbalizes understanding.  Hyperthyroid- Lab Results  Component Value Date   TSH 0.30 (L) 01/11/2017   TSH is low, she is not currently following with endocrinology. Will  arrange referral.

## 2017-02-04 NOTE — Patient Instructions (Signed)
Please complete lab work prior to leaving.   You will be contacted about your referal to GI. Go to ER if you have more than 1 bloody stool in 1 day, or if you develop shortness of breath/weakness.

## 2017-02-06 ENCOUNTER — Ambulatory Visit: Payer: Medicare Other | Admitting: Gastroenterology

## 2017-02-11 ENCOUNTER — Ambulatory Visit: Payer: Medicare Other | Admitting: Family

## 2017-02-18 ENCOUNTER — Ambulatory Visit: Payer: Self-pay | Admitting: Podiatry

## 2017-03-07 ENCOUNTER — Ambulatory Visit: Payer: Self-pay | Admitting: Podiatry

## 2017-03-21 ENCOUNTER — Ambulatory Visit: Payer: Self-pay | Admitting: Podiatry

## 2017-03-28 ENCOUNTER — Ambulatory Visit: Payer: Medicare Other | Admitting: Gastroenterology

## 2017-04-02 ENCOUNTER — Ambulatory Visit: Payer: Self-pay | Admitting: Podiatry

## 2017-04-08 ENCOUNTER — Ambulatory Visit: Payer: Medicare Other | Admitting: Internal Medicine

## 2017-04-16 ENCOUNTER — Ambulatory Visit: Payer: Medicare Other | Admitting: Gastroenterology

## 2017-04-18 ENCOUNTER — Other Ambulatory Visit: Payer: Self-pay | Admitting: Family

## 2017-04-18 ENCOUNTER — Encounter: Payer: Self-pay | Admitting: Podiatry

## 2017-04-18 ENCOUNTER — Ambulatory Visit: Payer: Self-pay | Admitting: Podiatry

## 2017-04-18 NOTE — Telephone Encounter (Signed)
Self.    Refill for: amitriptyline     amLODipine                 hydrochlorothiazide                  potassium chloride     rosuvastatin     SUMAtriptan      traZODone    Pharmacy: [facility] ([facility] [place] [facility] [address]

## 2017-04-19 ENCOUNTER — Other Ambulatory Visit: Payer: Self-pay

## 2017-04-19 MED ORDER — HYDROCHLOROTHIAZIDE 25 MG PO TABS: 25.0000 mg | ORAL_TABLET | Freq: Every day | ORAL | 0 refills | Status: DC

## 2017-04-19 MED ORDER — TRAZODONE HCL 50 MG PO TABS: 25.0000 mg | ORAL_TABLET | Freq: Every evening | ORAL | 3 refills | Status: DC | PRN

## 2017-04-19 MED ORDER — AMITRIPTYLINE HCL 25 MG PO TABS: 25.0000 mg | ORAL_TABLET | Freq: Every day | ORAL | 1 refills | Status: DC

## 2017-04-19 MED ORDER — AMLODIPINE BESYLATE 5 MG PO TABS: 5.0000 mg | ORAL_TABLET | Freq: Every day | ORAL | 1 refills | Status: DC

## 2017-04-19 MED ORDER — POTASSIUM CHLORIDE CRYS ER 20 MEQ PO TBCR: 20.0000 meq | EXTENDED_RELEASE_TABLET | Freq: Every day | ORAL | 5 refills | Status: DC

## 2017-04-19 MED ORDER — SUMATRIPTAN SUCCINATE 50 MG PO TABS: ORAL_TABLET | ORAL | 2 refills | Status: DC

## 2017-04-19 MED ORDER — ROSUVASTATIN CALCIUM 40 MG PO TABS: 40.0000 mg | ORAL_TABLET | ORAL | 1 refills | Status: DC

## 2017-04-19 NOTE — Telephone Encounter (Signed)
Correction contact " GI" to reschedule.

## 2017-04-19 NOTE — Telephone Encounter (Signed)
Okay to send 1 month supply with 2 refills please.  Also please contact patient and let her know that it appears she canceled her GI appointment.  I would like for her to contact below our GI to reschedule.

## 2017-04-19 NOTE — Telephone Encounter (Signed)
Medications refilled per M.Osullivan.

## 2017-04-22 ENCOUNTER — Ambulatory Visit: Payer: Medicare Other | Admitting: Family

## 2017-04-22 NOTE — Telephone Encounter (Signed)
Spoke with pt and GI appt has been r/s for 06/13/17.

## 2017-04-24 ENCOUNTER — Ambulatory Visit (INDEPENDENT_AMBULATORY_CARE_PROVIDER_SITE_OTHER): Payer: Medicare Other | Admitting: Family

## 2017-04-24 ENCOUNTER — Encounter: Payer: Self-pay | Admitting: Family

## 2017-04-24 VITALS — BP 141/84 | HR 64 | Temp 98.6°F | Resp 16 | Ht 64.0 in | Wt 226.2 lb

## 2017-04-24 DIAGNOSIS — M549 Dorsalgia, unspecified: Secondary | ICD-10-CM | POA: Diagnosis not present

## 2017-04-24 DIAGNOSIS — I10 Essential (primary) hypertension: Secondary | ICD-10-CM

## 2017-04-24 DIAGNOSIS — E0590 Thyrotoxicosis, unspecified without thyrotoxic crisis or storm: Secondary | ICD-10-CM

## 2017-04-24 DIAGNOSIS — G43909 Migraine, unspecified, not intractable, without status migrainosus: Secondary | ICD-10-CM | POA: Diagnosis not present

## 2017-04-24 DIAGNOSIS — G4700 Insomnia, unspecified: Secondary | ICD-10-CM

## 2017-04-24 DIAGNOSIS — D649 Anemia, unspecified: Secondary | ICD-10-CM | POA: Diagnosis not present

## 2017-04-24 DIAGNOSIS — Z23 Encounter for immunization: Secondary | ICD-10-CM | POA: Diagnosis not present

## 2017-04-24 MED ORDER — AMITRIPTYLINE HCL 50 MG PO TABS: 50.0000 mg | ORAL_TABLET | Freq: Every day | ORAL | 1 refills | Status: DC

## 2017-04-24 NOTE — Progress Notes (Signed)
Subjective:    Patient ID: [name], female    DOB: Sep 09, 1960, 56 y.o.   MRN: [id number]  HPI  Rectal bleeding- none since her last visit. GI appointment she rescheduled.  HTN- continues amlodipine 5mg , denies CP/SOB/Swelling.  BP Readings from Last 3 Encounters:  04/24/17 (!) 141/84  02/04/17 122/82  01/11/17 127/77   Hyperthyroid- scheduled to see endocrinology.   Lab Results  Component Value Date   TSH 0.30 (L) 01/11/2017   Migraines- reports daily migraines.    Anemia-  Lab Results  Component Value Date   WBC 6.8 02/04/2017   HGB 11.4 (L) 02/04/2017   HCT 36.2 02/04/2017   MCV 72.6 (L) 02/04/2017   PLT 229.0 02/04/2017   Back pan-  Reports that right sided low back pain.  Non-radiating. Worse with prolonged standing.   She reports difficulty sleeping.   Review of Systems See HPI  Past Medical History:  Diagnosis Date  . Allergy    allergic rhinitis  . Atypical chest pain   . Constipation   . Cyst, ovarian   . Diabetes mellitus, type II ([place])   . Endometriosis   . Esophageal stricture   . Gastroparesis   . GERD (gastroesophageal reflux disease)   . Hyperlipidemia   . Hypertension   . 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 Iron deficiency anemia   . Migraine headache   . Non-compliance   . Peptic ulcer disease      Social History   Social History  . Marital status: Married    Spouse name: N/A  . Number of children: 2  . Years of education: N/A   Occupational History  . DISABILITY Unemployed   Social History Main Topics  . Smoking status: Former Smoker    Packs/day: 0.50    Years: 18.00    Types: Cigarettes    Start date: 07/29/1977    Quit date: 06/25/1994  . Smokeless tobacco: Never Used     Comment: quit 19 years ago  . Alcohol use 0.0 oz/week     Comment: social drinker  . Drug use: No  . Sexual activity: Not on file   Other Topics Concern  . Not on file   Social  History Narrative   Married    Has 2 grown children.  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   Former Smoker Quit tobacco in 1996.  She was a pack a day smoker for approximately 10 years.   Alcohol use-yes: Social    Daily Caffeine Use:6 pack of pepsi daily     Illicit Drug Use - no    Patient does not get regular exercise.       Smoking Status:  quit    Past Surgical History:  Procedure Laterality Date  . ABDOMINAL EXPLORATION SURGERY     w/bso   . CARDIAC CATHETERIZATION  2009   mild non obstructive CAD  . CHOLECYSTECTOMY    . KNEE SURGERY  2005    left knee  . TOTAL ABDOMINAL HYSTERECTOMY      Family History  Problem Relation Age of Onset  . Hypertension Mother   . Rheum arthritis Mother   . Hypertension Father   . Diabetes Father   . Prostate cancer Father   . Kidney disease Father   . Diabetes Sister   . Fibromyalgia Sister   . Diabetes Maternal Grandmother   . Lung cancer Maternal Grandfather   . Colon  cancer Neg Hx     Allergies  Allergen Reactions  . Ace Inhibitors     REACTION: chronic cough  . Celebrex [Celecoxib] Other (See Comments)    "makes me bleed"  . Diovan [Valsartan]     angioedema    Current Outpatient Prescriptions on File Prior to Visit  Medication Sig Dispense Refill  . amLODipine (NORVASC) 5 MG tablet Take 1 tablet (5 mg total) by mouth daily. 90 tablet 1  . aspirin EC 81 MG tablet Take 81 mg by mouth daily.    . hydrochlorothiazide (HYDRODIURIL) 25 MG tablet Take 1 tablet (25 mg total) by mouth daily. 90 tablet 0  . potassium chloride SA (K-DUR,KLOR-CON) 20 MEQ tablet Take 1 tablet (20 mEq total) by mouth daily. 30 tablet 5  . promethazine (PHENADOZ) 25 MG suppository INSERT ONE SUPPOSITORY RECTALLY AS NEEDED 12 suppository 0  . rosuvastatin (CRESTOR) 40 MG tablet Take 1 tablet (40 mg total) by mouth every other day. 15 tablet 1  . SUMAtriptan (IMITREX) 50 MG tablet May repeat in 2 hours if headache not resolved. Max 2 tabs/24 hrs 10  tablet 2  . traZODone (DESYREL) 50 MG tablet Take 0.5-1 tablets (25-50 mg total) by mouth at bedtime as needed for sleep. 30 tablet 3  . [DISCONTINUED] atorvastatin (LIPITOR) 40 MG tablet Take 1 tablet (40 mg total) by mouth daily. 30 tablet 3  . [DISCONTINUED] famotidine (PEPCID) 20 MG tablet Take 1 tablet (20 mg total) by mouth 2 (two) times daily. 30 tablet 0   No current facility-administered medications on file prior to visit.     BP (!) 141/84 (BP Location: Left Arm, Cuff Size: Large)   Pulse 64   Temp 98.6 F (37 C) (Oral)   Resp 16   Ht 5\' 4"  (1.626 m)   Wt 226 lb 3.2 oz (102.6 kg)   SpO2 100%   BMI 38.83 kg/m       Objective:   Physical Exam  Constitutional: She appears well-developed and well-nourished.  Cardiovascular: Normal rate, regular rhythm and normal heart sounds.   No murmur heard. Pulmonary/Chest: Effort normal and breath sounds normal. No respiratory distress. She has no wheezes.  Neurological:  Bilateral LE strength is 5/5  Psychiatric: She has a normal mood and affect. Her behavior is normal. Judgment and thought content normal.          Assessment & Plan:  HTN- BP stable. Continue current meds.  Hyperthyroid- pt advised to keep her upcoming appointment with endo.  Anemia- pt to keep upcoming appointment with hematology  Migraines- uncontrolled. Will increase elavil from 25mg  to 50mg .    Insomnia- increase elavil as above.   Musculoskeletal low back pain-  She has done PT in the past and did not feel that it helped. Wishes to just monitor for now.

## 2017-04-24 NOTE — Patient Instructions (Addendum)
Please schedule follow up with Dr. Marin Olp.   Keep your upcoming appointments with endocrinology. Please increase your elavil from 25mg  to 50mg  to help with your insomnia and migraines.

## 2017-05-08 ENCOUNTER — Encounter: Payer: Self-pay | Admitting: Endocrinology

## 2017-05-08 ENCOUNTER — Ambulatory Visit: Payer: Medicare Other | Admitting: Endocrinology

## 2017-05-08 VITALS — BP 136/88 | HR 64 | Wt 225.6 lb

## 2017-05-08 DIAGNOSIS — E0590 Thyrotoxicosis, unspecified without thyrotoxic crisis or storm: Secondary | ICD-10-CM

## 2017-05-08 MED ORDER — METHIMAZOLE 5 MG PO TABS: 5.0000 mg | ORAL_TABLET | ORAL | 5 refills | Status: DC

## 2017-05-08 NOTE — Patient Instructions (Signed)
I have sent a prescription to your pharmacy, to slow the thyroid. If ever you have fever while taking methimazole, stop it and call us, even if the reason is obvious, because of the risk of a rare side-effect. Please come back for a follow-up appointment in 2 months.

## 2017-05-08 NOTE — Progress Notes (Signed)
Subjective:    Patient ID: [name], female    DOB: March 02, 1961, 56 y.o.   MRN: [id number]  HPI Pt is referred by [name], NP, for hyperthyroidism.  Pt reports he was dx'ed with hyperthyroidism in 2012.  she has never been on therapy for this.  she has never had XRT to the anterior neck, or thyroid surgery.  she has never had thyroid imaging.  she does not consume kelp or any other non-prescribed thyroid medication.  she has never been on amiodarone.  She reports slight palpitations in the chest, but no assoc weight change.   Past Medical History:  Diagnosis Date  . Allergy    allergic rhinitis  . Atypical chest pain   . Constipation   . Cyst, ovarian   . Diabetes mellitus, type II ([place])   . Endometriosis   . Esophageal stricture   . Gastroparesis   . GERD (gastroesophageal reflux disease)   . Hyperlipidemia   . Hypertension   . 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 Iron deficiency anemia   . Migraine headache   . Non-compliance   . Peptic ulcer disease     Past Surgical History:  Procedure Laterality Date  . ABDOMINAL EXPLORATION SURGERY     w/bso   . CARDIAC CATHETERIZATION  2009   mild non obstructive CAD  . CHOLECYSTECTOMY    . KNEE SURGERY  2005    left knee  . TOTAL ABDOMINAL HYSTERECTOMY      Social History   Socioeconomic History  . Marital status: Married    Spouse name: Not on file  . Number of children: 2  . Years of education: Not on file  . Highest education level: Not on file  Social Needs  . Financial resource strain: Not on file  . Food insecurity - worry: Not on file  . Food insecurity - inability: Not on file  . Transportation needs - medical: Not on file  . Transportation needs - non-medical: Not on file  Occupational History  . Occupation: DISABILITY    Employer: UNEMPLOYED  Tobacco Use  . Smoking status: Former Smoker    Packs/day: 0.50    Years: 18.00    Pack  years: 9.00    Types: Cigarettes    Start date: 07/29/1977    Last attempt to quit: 06/25/1994    Years since quitting: 22.8  . Smokeless tobacco: Never Used  . Tobacco comment: quit 19 years ago  Substance and Sexual Activity  . Alcohol use: Yes    Alcohol/week: 0.0 oz    Comment: social drinker  . Drug use: No  . Sexual activity: Not on file  Other Topics Concern  . Not on file  Social History Narrative   Married    Has 2 grown children.  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   Former Smoker Quit tobacco in 1996.  She was a pack a day smoker for approximately 10 years.   Alcohol use-yes: Social    Daily Caffeine Use:6 pack of pepsi daily     Illicit Drug Use - no    Patient does not get regular exercise.       Smoking Status:  quit    Current Outpatient Medications on File Prior to Visit  Medication Sig Dispense Refill  . amitriptyline (ELAVIL) 50 MG tablet Take 1 tablet (50 mg total) by mouth at bedtime. 90 tablet 1  . amLODipine (NORVASC) 5  MG tablet Take 1 tablet (5 mg total) by mouth daily. 90 tablet 1  . aspirin EC 81 MG tablet Take 81 mg by mouth daily.    . hydrochlorothiazide (HYDRODIURIL) 25 MG tablet Take 1 tablet (25 mg total) by mouth daily. 90 tablet 0  . potassium chloride SA (K-DUR,KLOR-CON) 20 MEQ tablet Take 1 tablet (20 mEq total) by mouth daily. 30 tablet 5  . promethazine (PHENADOZ) 25 MG suppository INSERT ONE SUPPOSITORY RECTALLY AS NEEDED 12 suppository 0  . rosuvastatin (CRESTOR) 40 MG tablet Take 1 tablet (40 mg total) by mouth every other day. 15 tablet 1  . SUMAtriptan (IMITREX) 50 MG tablet May repeat in 2 hours if headache not resolved. Max 2 tabs/24 hrs 10 tablet 2  . traZODone (DESYREL) 50 MG tablet Take 0.5-1 tablets (25-50 mg total) by mouth at bedtime as needed for sleep. 30 tablet 3  . [DISCONTINUED] atorvastatin (LIPITOR) 40 MG tablet Take 1 tablet (40 mg total) by mouth daily. 30 tablet 3  . [DISCONTINUED] famotidine (PEPCID) 20 MG tablet  Take 1 tablet (20 mg total) by mouth 2 (two) times daily. 30 tablet 0   No current facility-administered medications on file prior to visit.     Allergies  Allergen Reactions  . Ace Inhibitors     REACTION: chronic cough  . Celebrex [Celecoxib] Other (See Comments)    "makes me bleed"  . Diovan [Valsartan]     angioedema    Family History  Problem Relation Age of Onset  . Hypertension Mother   . Rheum arthritis Mother   . Hypertension Father   . Diabetes Father   . Prostate cancer Father   . Kidney disease Father   . Diabetes Sister   . Fibromyalgia Sister   . Diabetes Maternal Grandmother   . Lung cancer Maternal Grandfather   . Colon cancer Neg Hx   . Thyroid disease Neg Hx     BP 136/88 (BP Location: Left Arm, Patient Position: Sitting)   Pulse 64   Wt 225 lb 9.6 oz (102.3 kg)   SpO2 97%   BMI 38.72 kg/m     Review of Systems denies fever, fatigue, headache, hoarseness, visual loss, edema, sob, diarrhea, polyuria, muscle weakness, excessive diaphoresis, tremor, anxiety, heat intolerance, easy bruising, and rhinorrhea.      Objective:   Physical Exam VS: see vs page GEN: no distress HEAD: head: no deformity eyes: no periorbital swelling, no proptosis external nose and ears are normal mouth: no lesion seen NECK: supple, thyroid is not enlarged CHEST WALL: no deformity LUNGS: clear to auscultation CV: reg rate and rhythm, no murmur ABD: abdomen is soft, nontender.  no hepatosplenomegaly.  not distended.  no hernia MUSCULOSKELETAL: muscle bulk and strength are grossly normal.  no obvious joint swelling.  gait is normal and steady EXTEMITIES: no deformity.  no edema PULSES: no carotid bruit NEURO:  cn 2-12 grossly intact.   readily moves all 4's.  sensation is intact to touch on all 4's.  No tremor. SKIN:  Normal texture and temperature.  No rash or suspicious lesion is visible.  Not diaphoretic NODES:  None palpable at the neck PSYCH: alert,  well-oriented.  Does not appear anxious nor depressed.   Lab Results  Component Value Date   TSH 0.30 (L) 01/11/2017   T4TOTAL 12.3 11/28/2011   I personally reviewed electrocardiogram tracing (08/17/14): Indication: GI sxs.  Impression: NSR.  No MI.  No hypertrophy. NS-TWA Compared to 2014: no significant change.  Assessment & Plan:  Hyperthyroidism: new, mild, uncertain etiology: we discussed.  We discussed rx options: she chooses tapazole rx.    Patient Instructions  I have sent a prescription to your pharmacy, to slow the thyroid. If ever you have fever while taking methimazole, stop it and call us, even if the reason is obvious, because of the risk of a rare side-effect. Please come back for a follow-up appointment in 2 months.

## 2017-05-17 ENCOUNTER — Ambulatory Visit: Payer: Medicare Other | Admitting: Family Medicine

## 2017-05-17 ENCOUNTER — Ambulatory Visit: Payer: Self-pay

## 2017-05-17 ENCOUNTER — Encounter: Payer: Self-pay | Admitting: Family Medicine

## 2017-05-17 VITALS — BP 138/84 | HR 68 | Temp 98.6°F | Ht 64.0 in | Wt 224.6 lb

## 2017-05-17 DIAGNOSIS — G43809 Other migraine, not intractable, without status migrainosus: Secondary | ICD-10-CM | POA: Diagnosis not present

## 2017-05-17 DIAGNOSIS — R110 Nausea: Secondary | ICD-10-CM | POA: Diagnosis not present

## 2017-05-17 MED ORDER — ONDANSETRON 4 MG PO TBDP: 4.0000 mg | ORAL_TABLET | Freq: Three times a day (TID) | ORAL | 0 refills | Status: DC | PRN

## 2017-05-17 MED ORDER — KETOROLAC TROMETHAMINE 60 MG/2ML IM SOLN
60.0000 mg | Freq: Once | INTRAMUSCULAR | Status: AC
  Administered 2017-05-17: 60 mg via INTRAMUSCULAR

## 2017-05-17 NOTE — Progress Notes (Signed)
Pre visit review using our clinic review tool, if applicable. No additional management support is needed unless otherwise documented below in the visit note. 

## 2017-05-17 NOTE — Patient Instructions (Signed)
No NSAIDs (Aleve, ibuprofen, etc) for the rest of the day.  OK to take Tylenol 1000 mg (2 extra strength tabs) or 975 mg (3 regular strength tabs) every 6 hours as needed.  Heat (pad or rice pillow in microwave) over affected area, 10-15 minutes every 2-3 hours while awake.   Let us know if you need anything.

## 2017-05-17 NOTE — Telephone Encounter (Signed)
Pt. called and c/o severe migraine headache.  Rated 10/10 yesterday, and 8/10 today.  Reported vomiting yesterday.  C/o vision changes and neck ache with the migraine.  Reported the symptoms are typical, compared to previous headaches, but the Imitrex is not helping this time.  Appt. scheduled today @ [facility].  Pt. Advised to have someone drive her to the appt..  Stated her daughter will drive her.    Reason for Disposition . [1] SEVERE headache (e.g., excruciating) AND [2] not improved after 2 hours of pain medicine  Answer Assessment - Initial Assessment Questions 1. LOCATION: "Where does it hurt?"      Left side and moves around to back of head   2. ONSET: "When did the headache start?" (Minutes, hours or days)      Headache started yesterday 3. PATTERN: "Does the pain come and go, or has it been constant since it started?"     constant 4. SEVERITY: "How bad is the pain?" and "What does it keep you from doing?"  (e.g., Scale 1-10; mild, moderate, or severe)   - MILD (1-3): doesn't interfere with normal activities    - MODERATE (4-7): interferes with normal activities or awakens from sleep    - SEVERE (8-10): excruciating pain, unable to do any normal activities        10/10 yesterday; today is 8/10 5. RECURRENT SYMPTOM: "Have you ever had headaches before?" If so, ask: "When was the last time?" and "What happened that time?"      Yes; medication not really helping this time  6. CAUSE: "What do you think is causing the headache?"     Hx of migraines 7. MIGRAINE: "Have you been diagnosed with migraine headaches?" If so, ask: "Is this headache similar?"      yes 8. HEAD INJURY: "Has there been any recent injury to the head?"      no 9. OTHER SYMPTOMS: "Do you have any other symptoms?" (fever, stiff neck, eye pain, sore throat, cold symptoms)     Vision changes, neck pain, chills, vomiting intermittent yesterday  10. PREGNANCY: "Is there any chance you are pregnant?" "When was your last  menstrual period?"       No; hysterectomy  Protocols used: HEADACHE-A-AH

## 2017-05-17 NOTE — Progress Notes (Signed)
Chief Complaint  Patient presents with  . Follow-up    migraines  . Sinusitis    [name] is a 56 y.o. female here for evaluation of L sided migraine. Hx of migraines on abortive Imitrex only. Took and did not improve. Tried excedrin also w/o relief. No neurologic s/s's, injury, vision changes.  Past Medical History:  Diagnosis Date  . Allergy    allergic rhinitis  . Atypical chest pain   . Constipation   . Cyst, ovarian   . Diabetes mellitus, type II ([place])   . Endometriosis   . Esophageal stricture   . Gastroparesis   . GERD (gastroesophageal reflux disease)   . Hyperlipidemia   . Hypertension   . 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 Iron deficiency anemia   . Migraine headache   . Non-compliance   . Peptic ulcer disease       BP 138/84   Pulse 68   Temp 98.6 F (37 C) (Oral)   Ht 5\' 4"  (1.626 m)   Wt 224 lb 9.6 oz (101.9 kg)   SpO2 98%   BMI 38.55 kg/m  General: awake, alert, appearing stated age Eyes: PERRLA, EOMi Heart: RRR, no murmurs, no bruits Lungs: CTAB, no accessory muscle use Neuro: CN 2-12 intact, no cerebellar signs, DTR's equal and symmetry, no clonus MSK: 5/5 strength throughout, normal gait, no TTP over posterior cervical triangle or paraspinal cervical musculature Skin: Pain elicited with light pulling of hair Psych: Age appropriate judgment and insight, mood and affect normal  Other migraine without status migrainosus, not intractable - Plan: ketorolac (TORADOL) injection 60 mg  Nausea  Orders as above. Zofran for nausea. Follow up prn. The patient voiced understanding and agreement to the plan.  [place], [place] 11:47 AM 05/17/17

## 2017-05-24 ENCOUNTER — Encounter: Payer: Self-pay | Admitting: Family

## 2017-05-24 ENCOUNTER — Ambulatory Visit: Payer: Medicare Other | Admitting: Family

## 2017-05-24 VITALS — BP 131/77 | HR 71 | Temp 98.4°F | Resp 18 | Ht 64.0 in | Wt 229.0 lb

## 2017-05-24 DIAGNOSIS — I10 Essential (primary) hypertension: Secondary | ICD-10-CM | POA: Diagnosis not present

## 2017-05-24 DIAGNOSIS — E0590 Thyrotoxicosis, unspecified without thyrotoxic crisis or storm: Secondary | ICD-10-CM | POA: Diagnosis not present

## 2017-05-24 DIAGNOSIS — G4700 Insomnia, unspecified: Secondary | ICD-10-CM

## 2017-05-24 DIAGNOSIS — D500 Iron deficiency anemia secondary to blood loss (chronic): Secondary | ICD-10-CM

## 2017-05-24 DIAGNOSIS — G43809 Other migraine, not intractable, without status migrainosus: Secondary | ICD-10-CM | POA: Diagnosis not present

## 2017-05-24 NOTE — Patient Instructions (Signed)
Please contact Dr. Marin Olp to schedule a follow up appointment. [phone] Continue current meds.

## 2017-05-24 NOTE — Assessment & Plan Note (Signed)
Frequency has decreased. Will continue increased dose of elavil.

## 2017-05-24 NOTE — Assessment & Plan Note (Signed)
Clinically stable. Advised pt to arrange follow up with hematology.

## 2017-05-24 NOTE — Progress Notes (Signed)
Subjective:    Patient ID: [name], female    DOB: Feb 01, 1961, 56 y.o.   MRN: [id number]  HPI  [name] is a 56 yr old female who presents today for follow up.  Migraine- reports only one migraine since last visit. Elavil was increased.   HTN- maintained on amlodipine 5mg  once daily.   BP Readings from Last 3 Encounters:  05/24/17 131/77  05/17/17 138/84  05/08/17 136/88   Insomnia- last visit we increased elavil from 25mg  to 50mg . Reports sleeping a little better.   Started tapazole for hyperthyroid per Dr. Loanne Drilling.    Review of Systems See HPI  Past Medical History:  Diagnosis Date  . Allergy    allergic rhinitis  . Atypical chest pain   . Constipation   . Cyst, ovarian   . Diabetes mellitus, type II ([place])   . Endometriosis   . Esophageal stricture   . Gastroparesis   . GERD (gastroesophageal reflux disease)   . Hyperlipidemia   . Hypertension   . 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 Iron deficiency anemia   . Migraine headache   . Non-compliance   . Peptic ulcer disease      Social History   Socioeconomic History  . Marital status: Married    Spouse name: Not on file  . Number of children: 2  . Years of education: Not on file  . Highest education level: Not on file  Social Needs  . Financial resource strain: Not on file  . Food insecurity - worry: Not on file  . Food insecurity - inability: Not on file  . Transportation needs - medical: Not on file  . Transportation needs - non-medical: Not on file  Occupational History  . Occupation: DISABILITY    Employer: UNEMPLOYED  Tobacco Use  . Smoking status: Former Smoker    Packs/day: 0.50    Years: 18.00    Pack years: 9.00    Types: Cigarettes    Start date: 07/29/1977    Last attempt to quit: 06/25/1994    Years since quitting: 22.9  . Smokeless tobacco: Never Used  . Tobacco comment: quit 19 years ago  Substance and Sexual  Activity  . Alcohol use: Yes    Alcohol/week: 0.0 oz    Comment: social drinker  . Drug use: No  . Sexual activity: Not on file  Other Topics Concern  . Not on file  Social History Narrative   Married    Has 2 grown children.  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   Former Smoker Quit tobacco in 1996.  She was a pack a day smoker for approximately 10 years.   Alcohol use-yes: Social    Daily Caffeine Use:6 pack of pepsi daily     Illicit Drug Use - no    Patient does not get regular exercise.       Smoking Status:  quit    Past Surgical History:  Procedure Laterality Date  . ABDOMINAL EXPLORATION SURGERY     w/bso   . CARDIAC CATHETERIZATION  2009   mild non obstructive CAD  . CHOLECYSTECTOMY    . KNEE SURGERY  2005    left knee  . TOTAL ABDOMINAL HYSTERECTOMY      Family History  Problem Relation Age of Onset  . Hypertension Mother   . Rheum arthritis Mother   . Hypertension Father   . Diabetes Father   .  Prostate cancer Father   . Kidney disease Father   . Diabetes Sister   . Fibromyalgia Sister   . Diabetes Maternal Grandmother   . Lung cancer Maternal Grandfather   . Colon cancer Neg Hx   . Thyroid disease Neg Hx     Allergies  Allergen Reactions  . Ace Inhibitors     REACTION: chronic cough  . Celebrex [Celecoxib] Other (See Comments)    "makes me bleed"  . Diovan [Valsartan]     angioedema    Current Outpatient Medications on File Prior to Visit  Medication Sig Dispense Refill  . amitriptyline (ELAVIL) 50 MG tablet Take 1 tablet (50 mg total) by mouth at bedtime. 90 tablet 1  . amLODipine (NORVASC) 5 MG tablet Take 1 tablet (5 mg total) by mouth daily. 90 tablet 1  . aspirin EC 81 MG tablet Take 81 mg by mouth daily.    . hydrochlorothiazide (HYDRODIURIL) 25 MG tablet Take 1 tablet (25 mg total) by mouth daily. 90 tablet 0  . methimazole (TAPAZOLE) 5 MG tablet Take 1 tablet (5 mg total) 3 (three) times a week by mouth. 12 tablet 5  . ondansetron  (ZOFRAN ODT) 4 MG disintegrating tablet Take 1 tablet (4 mg total) by mouth every 8 (eight) hours as needed for nausea or vomiting. 20 tablet 0  . potassium chloride SA (K-DUR,KLOR-CON) 20 MEQ tablet Take 1 tablet (20 mEq total) by mouth daily. 30 tablet 5  . promethazine (PHENADOZ) 25 MG suppository INSERT ONE SUPPOSITORY RECTALLY AS NEEDED 12 suppository 0  . rosuvastatin (CRESTOR) 40 MG tablet Take 1 tablet (40 mg total) by mouth every other day. 15 tablet 1  . SUMAtriptan (IMITREX) 50 MG tablet May repeat in 2 hours if headache not resolved. Max 2 tabs/24 hrs 10 tablet 2  . traZODone (DESYREL) 50 MG tablet Take 0.5-1 tablets (25-50 mg total) by mouth at bedtime as needed for sleep. 30 tablet 3  . [DISCONTINUED] atorvastatin (LIPITOR) 40 MG tablet Take 1 tablet (40 mg total) by mouth daily. 30 tablet 3  . [DISCONTINUED] famotidine (PEPCID) 20 MG tablet Take 1 tablet (20 mg total) by mouth 2 (two) times daily. 30 tablet 0   No current facility-administered medications on file prior to visit.     BP 131/77 (BP Location: Right Arm, Cuff Size: Large)   Pulse 71   Temp 98.4 F (36.9 C) (Oral)   Resp 18   Ht 5\' 4"  (1.626 m)   Wt 229 lb (103.9 kg)   SpO2 99%   BMI 39.31 kg/m       Objective:   Physical Exam  Constitutional: She is oriented to person, place, and time. She appears well-developed and well-nourished.  HENT:  Head: Normocephalic and atraumatic.  Cardiovascular: Normal rate, regular rhythm and normal heart sounds.  No murmur heard. Pulmonary/Chest: Effort normal and breath sounds normal. No respiratory distress. She has no wheezes.  Musculoskeletal: She exhibits no edema.  Neurological: She is alert and oriented to person, place, and time.  Skin: Skin is warm and dry.  Psychiatric: She has a normal mood and affect. Her behavior is normal. Judgment and thought content normal.          Assessment & Plan:

## 2017-05-24 NOTE — Assessment & Plan Note (Signed)
Management per endo 

## 2017-05-24 NOTE — Assessment & Plan Note (Signed)
BP is stable on current medications.

## 2017-05-24 NOTE — Assessment & Plan Note (Signed)
Improved on increased dose of elavil.

## 2017-05-30 ENCOUNTER — Other Ambulatory Visit: Payer: Self-pay | Admitting: Family

## 2017-06-12 NOTE — Progress Notes (Deleted)
Subjective:   [name] is a 56 y.o. female who presents for a Medicare Annual Wellness Visit.  Review of Systems   No ROS.  Medicare Wellness Visit. additional risk factors are reflected in the social history.   Sleep patterns:  Home Safety/Smoke Alarms: Feels safe in home. Smoke alarms in place.  Living environment; residence and Firearm Safety: {Rehab home environment / accessibility:30080::"no firearms","firearms stored safely"}. Seat Belt Safety/Bike Helmet: Wears seat belt.   Female:   Pap-       Mammo- last 05/07/16: normal    Dexa scan- last 05/07/16: osteopenia       CCS- last 12/29/08: recall 5 years    Objective:    There were no vitals filed for this visit. There is no height or weight on file to calculate BMI.  Advanced Directives 01/07/2017 12/14/2016 08/13/2016 03/30/2016 11/18/2015 11/16/2015 08/16/2015  Does Patient Have a Medical Advance Directive? No Yes No No Yes Yes No  Type of Advance Directive - [place];Living will - - - [place];Living will -  Copy of [place] in Chart? - - - - - No - copy requested -  Would patient like information on creating a medical advance directive? - - Yes (MAU/Ambulatory/Procedural Areas - Information given) - - - -    Current Medications (verified) Outpatient Encounter Medications as of 06/14/2017  Medication Sig  . amitriptyline (ELAVIL) 50 MG tablet Take 1 tablet (50 mg total) by mouth at bedtime.  Marland Kitchen amLODipine (NORVASC) 5 MG tablet Take 1 tablet (5 mg total) by mouth daily.  Marland Kitchen aspirin EC 81 MG tablet Take 81 mg by mouth daily.  . hydrochlorothiazide (HYDRODIURIL) 25 MG tablet Take 1 tablet (25 mg total) by mouth daily.  . methimazole (TAPAZOLE) 5 MG tablet Take 1 tablet (5 mg total) 3 (three) times a week by mouth.  . ondansetron (ZOFRAN ODT) 4 MG disintegrating tablet Take 1 tablet (4 mg total) by mouth every 8 (eight) hours as needed for nausea or vomiting.  .  potassium chloride SA (K-DUR,KLOR-CON) 20 MEQ tablet Take 1 tablet (20 mEq total) by mouth daily.  . promethazine (PHENADOZ) 25 MG suppository INSERT ONE SUPPOSITORY RECTALLY AS NEEDED  . rosuvastatin (CRESTOR) 40 MG tablet Take 1 tablet (40 mg total) by mouth every other day.  . SUMAtriptan (IMITREX) 50 MG tablet May repeat in 2 hours if headache not resolved. Max 2 tabs/24 hrs  . traZODone (DESYREL) 50 MG tablet Take 0.5-1 tablets (25-50 mg total) by mouth at bedtime as needed for sleep.   No facility-administered encounter medications on file as of 06/14/2017.     Allergies (verified) Ace inhibitors; Celebrex [celecoxib]; and Diovan [valsartan]   History: Past Medical History:  Diagnosis Date  . Allergy    allergic rhinitis  . Atypical chest pain   . Constipation   . Cyst, ovarian   . Diabetes mellitus, type II ([place])   . Endometriosis   . Esophageal stricture   . Gastroparesis   . GERD (gastroesophageal reflux disease)   . Hyperlipidemia   . Hypertension   . 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 Iron deficiency anemia   . Migraine headache   . Non-compliance   . Peptic ulcer disease    Past Surgical History:  Procedure Laterality Date  . ABDOMINAL EXPLORATION SURGERY     w/bso   . CARDIAC CATHETERIZATION  2009   mild  non obstructive CAD  . CHOLECYSTECTOMY    . KNEE SURGERY  2005    left knee  . TOTAL ABDOMINAL HYSTERECTOMY     Family History  Problem Relation Age of Onset  . Hypertension Mother   . Rheum arthritis Mother   . Hypertension Father   . Diabetes Father   . Prostate cancer Father   . Kidney disease Father   . Diabetes Sister   . Fibromyalgia Sister   . Diabetes Maternal Grandmother   . Lung cancer Maternal Grandfather   . Colon cancer Neg Hx   . Thyroid disease Neg Hx    Social History   Socioeconomic History  . Marital status: Married    Spouse name: Not on file  . Number of  children: 2  . Years of education: Not on file  . Highest education level: Not on file  Social Needs  . Financial resource strain: Not on file  . Food insecurity - worry: Not on file  . Food insecurity - inability: Not on file  . Transportation needs - medical: Not on file  . Transportation needs - non-medical: Not on file  Occupational History  . Occupation: DISABILITY    Employer: UNEMPLOYED  Tobacco Use  . Smoking status: Former Smoker    Packs/day: 0.50    Years: 18.00    Pack years: 9.00    Types: Cigarettes    Start date: 07/29/1977    Last attempt to quit: 06/25/1994    Years since quitting: 22.9  . Smokeless tobacco: Never Used  . Tobacco comment: quit 19 years ago  Substance and Sexual Activity  . Alcohol use: Yes    Alcohol/week: 0.0 oz    Comment: social drinker  . Drug use: No  . Sexual activity: Not on file  Other Topics Concern  . Not on file  Social History Narrative   Married    Has 2 grown children.  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   Former Smoker Quit tobacco in 1996.  She was a pack a day smoker for approximately 10 years.   Alcohol use-yes: Social    Daily Caffeine Use:6 pack of pepsi daily     Illicit Drug Use - no    Patient does not get regular exercise.       Smoking Status:  quit    Tobacco Counseling Counseling given: Not Answered Comment: quit 19 years ago   Clinical Intake:                        Activities of Daily Living No flowsheet data found.   Immunizations and Health Maintenance Immunization History  Administered Date(s) Administered  . Influenza Split 04/30/2011  . Influenza Whole 04/19/2006, 04/17/2007, 03/08/2009, 03/01/2010  . Influenza, Seasonal, Injecte, Preservative Fre 05/30/2012  . Influenza,inj,Quad PF,6+ Mos 02/15/2014, 03/08/2015, 04/30/2016, 04/24/2017  . Pneumococcal Polysaccharide-23 03/08/2009  . Td 05/17/1999  . Tdap 04/30/2011   There are no preventive care reminders to display for  this patient.  Patient Care Team: [name], NP as PCP - General (Internal Medicine) [name], MD as Consulting Physician (Internal Medicine) Amalia Greenhouse, MD as Consulting Physician (Endocrinology) [name], MD as Consulting Physician (Oncology)  Indicate any recent Medical Services you may have received from other than [facility] providers in the past year (date may be approximate).     Assessment:   This is a routine wellness examination for Jonathan. .nopcpe  Dietary issues and exercise  activities discussed:   Diet (meal preparation, eat out, water intake, caffeinated beverages, dairy products, fruits and vegetables): {Desc; diets:16563} Breakfast: Lunch:  Dinner:      Goals    . Increase physical activity    . Reduce salt intake to 2 grams per day or less    . Reduce sugar intake      Depression Screen PHQ 2/9 Scores 04/24/2017 02/11/2015  PHQ - 2 Score 0 0  PHQ- 9 Score 5 -    Fall Risk Fall Risk  11/18/2015 11/16/2015 08/16/2015 04/08/2015 02/11/2015  Falls in the past year? No No No No No    Cognitive Function: MMSE - Mini Mental State Exam 02/11/2015  Not completed: (No Data)        Screening Tests Health Maintenance  Topic Date Due  . Hepatitis C Screening  05/24/2018 (Originally 10-15-1960)  . HIV Screening  05/24/2018 (Originally 09/13/1975)  . MAMMOGRAM  05/07/2018  . COLONOSCOPY  12/30/2018  . TETANUS/TDAP  04/29/2021  . INFLUENZA VACCINE  Completed     Plan:   ***  I have personally reviewed and noted the following in the patient's chart:   . Medical and social history . Use of alcohol, tobacco or illicit drugs  . Current medications and supplements . Functional ability and status . Nutritional status . Physical activity . Advanced directives . List of other physicians . Hospitalizations, surgeries, and ER visits in previous 12 months . Vitals . Screenings to include cognitive, depression, and falls . Referrals and  appointments  In addition, I have reviewed and discussed with patient certain preventive protocols, quality metrics, and best practice recommendations. A written personalized care plan for preventive services as well as general preventive health recommendations were provided to patient.     Shela Nevin, [place]   06/12/2017

## 2017-06-13 ENCOUNTER — Ambulatory Visit (INDEPENDENT_AMBULATORY_CARE_PROVIDER_SITE_OTHER): Payer: Medicare Other | Admitting: Gastroenterology

## 2017-06-13 ENCOUNTER — Encounter: Payer: Self-pay | Admitting: Gastroenterology

## 2017-06-13 VITALS — BP 128/80 | HR 84 | Ht 64.0 in | Wt 227.5 lb

## 2017-06-13 DIAGNOSIS — R110 Nausea: Secondary | ICD-10-CM

## 2017-06-13 DIAGNOSIS — K5900 Constipation, unspecified: Secondary | ICD-10-CM | POA: Diagnosis not present

## 2017-06-13 DIAGNOSIS — K625 Hemorrhage of anus and rectum: Secondary | ICD-10-CM

## 2017-06-13 DIAGNOSIS — R1310 Dysphagia, unspecified: Secondary | ICD-10-CM | POA: Diagnosis not present

## 2017-06-13 DIAGNOSIS — Z86010 Personal history of colonic polyps: Secondary | ICD-10-CM

## 2017-06-13 DIAGNOSIS — D509 Iron deficiency anemia, unspecified: Secondary | ICD-10-CM

## 2017-06-13 MED ORDER — PEG-KCL-NACL-NASULF-NA ASC-C 140 G PO SOLR: 1.0000 | Freq: Once | ORAL | 0 refills | Status: AC

## 2017-06-13 MED ORDER — LINACLOTIDE 145 MCG PO CAPS: 145.0000 ug | ORAL_CAPSULE | Freq: Every day | ORAL | 0 refills | Status: DC

## 2017-06-13 NOTE — Progress Notes (Signed)
HPI :  55 year old female with history of diabetes, reflux, chronic constipation, reported history of gastroparesis, referred here by [name], for symptoms of rectal bleeding / constipation, and dysphagia. She was last seen here by Dr. Deatra Ina on 07/25/2012, she is new patient to me.  She reports experiencing rectal bleeding for the past few months. She states she sees bright red blood with occasional clots and roughly 30% of her stools. Sometimes passage of blood is associated with perianal pain. She has ongoing constipation, associated with straining and passing hard stools. She has 1 bowel movement every other day with the use of Dulcolax. She has previously tried [place] which she states did not help. She states she has tried taking multiple doses of MiraLAX in the past which she doesn't think has helped too much. She does have some occasional lower abdominal pain that is reliably relieved with a bowel movement.Marland Kitchen He denies any family history of colon cancer. She did have a colonoscopy in 2010, at which time she had a small adenoma removed. She has not followed up for her surveillance colonoscopy. Of note she takes Elavil for therapy for her migraine headaches, which she states works well for her migraines.  She otherwise endorses ongoing nausea, but no vomiting, nausea is intermittent. She had a gastric imaging study in 2010 which showed gastroparesis. She reportedly was tried on Reglan, erythromycin, and domperidone without relief. She had a follow-up gastric emptying study in 2011 which was normal. She otherwise endorses dysphagia to solid foods which she experiences in her lower chest. She states this is been going on for some time now but she has not sought evaluation until now. She had a prior EGD in 2010 which do not show any esophageal problems.  Of note she is followed by Dr. Marin Olp for iron deficiency. Her anemia resolved on iron therapy however her microcytosis persists in the  setting of normal iron stores.   Endoscopic history: EGD 05/04/2009 - ? Inflammation of small bowel, mild gastritis Colonoscopy 12/29/2008 - 2 small polyps, otherwise normal - tubular adenoma    Past Medical History:  Diagnosis Date  . Allergy    allergic rhinitis  . Atypical chest pain   . Constipation   . Cyst, ovarian   . Diabetes mellitus, type II ([place])   . Endometriosis   . Esophageal stricture   . Gastroparesis   . GERD (gastroesophageal reflux disease)   . Hyperlipidemia   . Hypertension   . 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 Iron deficiency anemia   . Migraine headache   . Non-compliance   . Peptic ulcer disease      Past Surgical History:  Procedure Laterality Date  . ABDOMINAL EXPLORATION SURGERY     w/bso   . CARDIAC CATHETERIZATION  2009   mild non obstructive CAD  . CHOLECYSTECTOMY    . KNEE SURGERY  2005    left knee  . TOTAL ABDOMINAL HYSTERECTOMY     Family History  Problem Relation Age of Onset  . Hypertension Mother   . Rheum arthritis Mother   . Hypertension Father   . Diabetes Father   . Prostate cancer Father   . Kidney disease Father   . Diabetes Sister   . Fibromyalgia Sister   . Diabetes Maternal Grandmother   . Lung cancer Maternal Grandfather   . Colon cancer Neg Hx   . Thyroid disease Neg Hx    Social  History   Tobacco Use  . Smoking status: Former Smoker    Packs/day: 0.50    Years: 18.00    Pack years: 9.00    Types: Cigarettes    Start date: 07/29/1977    Last attempt to quit: 06/25/1994    Years since quitting: 22.9  . Smokeless tobacco: Never Used  . Tobacco comment: quit 19 years ago  Substance Use Topics  . Alcohol use: Yes    Alcohol/week: 0.0 oz    Comment: social drinker  . Drug use: No   Current Outpatient Medications  Medication Sig Dispense Refill  . amitriptyline (ELAVIL) 50 MG tablet Take 1 tablet (50 mg total) by mouth at bedtime. 90 tablet 1    . amLODipine (NORVASC) 5 MG tablet Take 1 tablet (5 mg total) by mouth daily. 90 tablet 1  . aspirin EC 81 MG tablet Take 81 mg by mouth daily.    . hydrochlorothiazide (HYDRODIURIL) 25 MG tablet Take 1 tablet (25 mg total) by mouth daily. 90 tablet 0  . methimazole (TAPAZOLE) 5 MG tablet Take 1 tablet (5 mg total) 3 (three) times a week by mouth. 12 tablet 5  . ondansetron (ZOFRAN ODT) 4 MG disintegrating tablet Take 1 tablet (4 mg total) by mouth every 8 (eight) hours as needed for nausea or vomiting. 20 tablet 0  . potassium chloride SA (K-DUR,KLOR-CON) 20 MEQ tablet Take 1 tablet (20 mEq total) by mouth daily. 30 tablet 5  . promethazine (PHENADOZ) 25 MG suppository INSERT ONE SUPPOSITORY RECTALLY AS NEEDED 12 suppository 0  . rosuvastatin (CRESTOR) 40 MG tablet Take 1 tablet (40 mg total) by mouth every other day. 15 tablet 1  . SUMAtriptan (IMITREX) 50 MG tablet May repeat in 2 hours if headache not resolved. Max 2 tabs/24 hrs 10 tablet 2  . traZODone (DESYREL) 50 MG tablet Take 0.5-1 tablets (25-50 mg total) by mouth at bedtime as needed for sleep. 30 tablet 3   No current facility-administered medications for this visit.    Allergies  Allergen Reactions  . Ace Inhibitors     REACTION: chronic cough  . Celebrex [Celecoxib] Other (See Comments)    "makes me bleed"  . Diovan [Valsartan]     angioedema     Review of Systems: All systems reviewed and negative except where noted in HPI.   Lab Results  Component Value Date   WBC 6.8 02/04/2017   HGB 11.4 (L) 02/04/2017   HCT 36.2 02/04/2017   MCV 72.6 (L) 02/04/2017   PLT 229.0 02/04/2017    Lab Results  Component Value Date   CREATININE 0.74 01/11/2017   BUN 10 01/11/2017   NA 139 01/11/2017   K 3.5 01/11/2017   CL 103 01/11/2017   CO2 30 01/11/2017    Lab Results  Component Value Date   ALT 21 01/11/2017   AST 17 01/11/2017   ALKPHOS 117 01/11/2017   BILITOT 0.3 01/11/2017   Lab Results  Component Value Date    IRON 57 01/21/2017   TIBC 265 12/14/2016   FERRITIN 1,478 (H) 12/14/2016     Physical Exam: BP 128/80   Pulse 84   Ht 5\' 4"  (1.626 m)   Wt 103.2 kg (227 lb 8 oz)   BMI 39.05 kg/m  Constitutional: Pleasant,well-developed, female in no acute distress. HEENT: Normocephalic and atraumatic. Conjunctivae are normal. No scleral icterus. Neck supple.  Cardiovascular: Normal rate, regular rhythm.  Pulmonary/chest: Effort normal and breath sounds normal. No wheezing, rales  or rhonchi. Abdominal: Soft, protuberant, nontender. There are no masses palpable. No hepatomegaly. DRE / Anoscopy - no fissure, internal hemorrhoids, no mass lesion Extremities: no edema Lymphadenopathy: No cervical adenopathy noted. Neurological: Alert and oriented to person place and time. Skin: Skin is warm and dry. No rashes noted. Psychiatric: Normal mood and affect. Behavior is normal.   ASSESSMENT AND PLAN: 56 y/o female with a history of iron deficiency here for a new patient referral for rectal bleeding, worsening constipation, and dysphagia as outlined.   Rectal bleeding / constipation - anoscopy shows internal hemorrhoids, this could be the source of her bleeding, however him recommending a colonoscopy to further evaluate and rule out any polyps or mass lesions which could present like this, she is overdue for surveillance colonoscopy for history of adenomas. I discussed the risks and benefits of colonoscopy and anesthesia with her and she wanted to proceed. In the interim on recommending more aggressive regimen for her constipation. We will try Linzess 145 g once daily. I gave her a free sample this, if the dose is not high enough she can try taking it twice daily. If this works for her I asked her to call me so we can order a prescription. In the interim she can use 1% hydrocortisone applied within the anal canal using a glycerin suppository, once daily to treat hemorrhoids until her colonoscopy. Colonoscopy  will be done as soon as possible, in first available slot.  Dysphagia / nausea - intermittent solid food dysphagia. I'm recommending an upper endoscopy at the time of colonoscopy, to further evaluate and potentially treat with dilation. We'll also valuate her stomach in light of her nausea. Following discussion of EGD in regards to risk and benefits she wanted to proceed.  Anemia - history of iron deficiency, noted to remain microcytic despite adequate iron levels, will await endoscopic evaluation  History of colon adenomas - proceed with colonoscopy as outlined above.  [place] Cellar, MD [facility] Pager [phone]  CC: [name], NP

## 2017-06-13 NOTE — Patient Instructions (Addendum)
If you are age 58 or older, your body mass index should be between 23-30. Your Body mass index is 39.05 kg/m. If this is out of the aforementioned range listed, please consider follow up with your Primary Care Provider.  If you are age 81 or younger, your body mass index should be between 19-25. Your Body mass index is 39.05 kg/m. If this is out of the aformentioned range listed, please consider follow up with your Primary Care Provider.   You have been scheduled for an endoscopy and colonoscopy. Please follow the written instructions given to you at your visit today. Please pick up your prep supplies at the pharmacy within the next 1-3 days. If you use inhalers (even only as needed), please bring them with you on the day of your procedure. Your physician has requested that you go to www.startemmi.com and enter the access code given to you at your visit today. This web site gives a general overview about your procedure. However, you should still follow specific instructions given to you by our office regarding your preparation for the procedure.  We have given you samples of the following medication to take: Linzess 145mcg, take once a day.   Please call us in 2 weeks and let us know if this is helping with your symptoms.  Please purchase the following medications over the counter and take as directed: Hydrocortisone cream (1%) and glycerin suppositories.  Use a pea size amount of the hydrocortisone cream on a glycerin suppository and insert into the rectum once a day for hemorrhoids.  Thank you for entrusting me with your care, Dr. [place] Cellar

## 2017-06-14 ENCOUNTER — Ambulatory Visit: Payer: Medicare Other | Admitting: *Deleted

## 2017-06-14 ENCOUNTER — Ambulatory Visit: Payer: Medicare Other | Admitting: Family

## 2017-06-20 NOTE — Progress Notes (Deleted)
Subjective:   [name] is a 56 y.o. female who presents for Medicare Annual (Subsequent) preventive examination.  Review of Systems:No ROS.  Medicare Wellness Visit. Additional risk factors are reflected in the social history.   Sleep patterns:  Home Safety/Smoke Alarms: Feels safe in home. Smoke alarms in place.    Female:   Pap- pt states no longer getting screened due to hysterectomy       Mammo-  Last 05/07/16. normal    Dexa scan- last 05/07/16: osteopenia         CCS- last 7//10 with 5 yrs recall  Objective:     Vitals: There were no vitals taken for this visit.  There is no height or weight on file to calculate BMI.  Advanced Directives 01/07/2017 12/14/2016 08/13/2016 03/30/2016 11/18/2015 11/16/2015 08/16/2015  Does Patient Have a Medical Advance Directive? No Yes No No Yes Yes No  Type of Advance Directive - [place];Living will - - - [place];Living will -  Copy of [place] in Chart? - - - - - No - copy requested -  Would patient like information on creating a medical advance directive? - - Yes (MAU/Ambulatory/Procedural Areas - Information given) - - - -    Tobacco Social History   Tobacco Use  Smoking Status Former Smoker  . Packs/day: 0.50  . Years: 18.00  . Pack years: 9.00  . Types: Cigarettes  . Start date: 07/29/1977  . Last attempt to quit: 06/25/1994  . Years since quitting: 23.0  Smokeless Tobacco Never Used  Tobacco Comment   quit 19 years ago     Counseling given: Not Answered Comment: quit 19 years ago   Clinical Intake:                       Past Medical History:  Diagnosis Date  . Allergy    allergic rhinitis  . Atypical chest pain   . Constipation   . Cyst, ovarian   . Diabetes mellitus, type II ([place])   . Endometriosis   . Esophageal stricture   . Gastroparesis   . GERD (gastroesophageal reflux disease)   . Hyperlipidemia   . Hypertension   .  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 Iron deficiency anemia   . Migraine headache   . Non-compliance   . Peptic ulcer disease    Past Surgical History:  Procedure Laterality Date  . ABDOMINAL EXPLORATION SURGERY     w/bso   . CARDIAC CATHETERIZATION  2009   mild non obstructive CAD  . CHOLECYSTECTOMY    . KNEE SURGERY  2005    left knee  . TOTAL ABDOMINAL HYSTERECTOMY     Family History  Problem Relation Age of Onset  . Hypertension Mother   . Rheum arthritis Mother   . Hypertension Father   . Diabetes Father   . Prostate cancer Father   . Kidney disease Father   . Diabetes Sister   . Fibromyalgia Sister   . Diabetes Maternal Grandmother   . Lung cancer Maternal Grandfather   . Colon cancer Neg Hx   . Thyroid disease Neg Hx    Social History   Socioeconomic History  . Marital status: Widowed    Spouse name: Not on file  . Number of children: 2  . Years of education: Not on file  . Highest education level: Not on  file  Social Needs  . Financial resource strain: Not on file  . Food insecurity - worry: Not on file  . Food insecurity - inability: Not on file  . Transportation needs - medical: Not on file  . Transportation needs - non-medical: Not on file  Occupational History  . Occupation: DISABILITY    Employer: UNEMPLOYED  Tobacco Use  . Smoking status: Former Smoker    Packs/day: 0.50    Years: 18.00    Pack years: 9.00    Types: Cigarettes    Start date: 07/29/1977    Last attempt to quit: 06/25/1994    Years since quitting: 23.0  . Smokeless tobacco: Never Used  . Tobacco comment: quit 19 years ago  Substance and Sexual Activity  . Alcohol use: Yes    Alcohol/week: 0.0 oz    Comment: social drinker  . Drug use: No  . Sexual activity: Not on file  Other Topics Concern  . Not on file  Social History Narrative   Married    Has 2 grown children.  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   Former  Smoker Quit tobacco in 1996.  She was a pack a day smoker for approximately 10 years.   Alcohol use-yes: Social    Daily Caffeine Use:6 pack of pepsi daily     Illicit Drug Use - no    Patient does not get regular exercise.       Smoking Status:  quit    Outpatient Encounter Medications as of 06/28/2017  Medication Sig  . amitriptyline (ELAVIL) 50 MG tablet Take 1 tablet (50 mg total) by mouth at bedtime.  Marland Kitchen amLODipine (NORVASC) 5 MG tablet Take 1 tablet (5 mg total) by mouth daily.  Marland Kitchen aspirin EC 81 MG tablet Take 81 mg by mouth daily.  . hydrochlorothiazide (HYDRODIURIL) 25 MG tablet Take 1 tablet (25 mg total) by mouth daily.  Marland Kitchen linaclotide (LINZESS) 145 MCG CAPS capsule Take 1 capsule (145 mcg total) by mouth daily before breakfast.  . methimazole (TAPAZOLE) 5 MG tablet Take 1 tablet (5 mg total) 3 (three) times a week by mouth.  . ondansetron (ZOFRAN ODT) 4 MG disintegrating tablet Take 1 tablet (4 mg total) by mouth every 8 (eight) hours as needed for nausea or vomiting.  . potassium chloride SA (K-DUR,KLOR-CON) 20 MEQ tablet Take 1 tablet (20 mEq total) by mouth daily.  . promethazine (PHENADOZ) 25 MG suppository INSERT ONE SUPPOSITORY RECTALLY AS NEEDED  . rosuvastatin (CRESTOR) 40 MG tablet Take 1 tablet (40 mg total) by mouth every other day.  . SUMAtriptan (IMITREX) 50 MG tablet May repeat in 2 hours if headache not resolved. Max 2 tabs/24 hrs  . traZODone (DESYREL) 50 MG tablet Take 0.5-1 tablets (25-50 mg total) by mouth at bedtime as needed for sleep.   No facility-administered encounter medications on file as of 06/28/2017.     Activities of Daily Living No flowsheet data found.  Patient Care Team: [name], NP as PCP - General (Internal Medicine) Marin Olp Rudell Cobb, MD as Consulting Physician (Internal Medicine) Amalia Greenhouse, MD as Consulting Physician (Endocrinology) [name], MD as Consulting Physician (Oncology)    Assessment:   This is a routine  wellness examination for [name]. Physical assessment deferred to PCP.  Exercise Activities and Dietary recommendations   Diet (meal preparation, eat out, water intake, caffeinated beverages, dairy products, fruits and vegetables): {Desc; diets:16563} Breakfast: Lunch:  Dinner:      Goals    .  Increase physical activity    . Reduce salt intake to 2 grams per day or less    . Reduce sugar intake       Fall Risk Fall Risk  11/18/2015 11/16/2015 08/16/2015 04/08/2015 02/11/2015  Falls in the past year? No No No No No    Depression Screen PHQ 2/9 Scores 04/24/2017 02/11/2015  PHQ - 2 Score 0 0  PHQ- 9 Score 5 -     Cognitive Function MMSE - Mini Mental State Exam 02/11/2015  Not completed: (No Data)        Immunization History  Administered Date(s) Administered  . Influenza Split 04/30/2011  . Influenza Whole 04/19/2006, 04/17/2007, 03/08/2009, 03/01/2010  . Influenza, Seasonal, Injecte, Preservative Fre 05/30/2012  . Influenza,inj,Quad PF,6+ Mos 02/15/2014, 03/08/2015, 04/30/2016, 04/24/2017  . Pneumococcal Polysaccharide-23 03/08/2009  . Td 05/17/1999  . Tdap 04/30/2011    Screening Tests Health Maintenance  Topic Date Due  . Hepatitis C Screening  05[birth year]24[birth year]2018 (Originally [birth year])  . HIV Screening  05/24/2018 (Originally 09/13/1975)  . MAMMOGRAM  05/07/2018  . COLONOSCOPY  12/30/2018  . TETANUS/TDAP  04/29/2021  . INFLUENZA VACCINE  Completed      Plan:   ***   I have personally reviewed and noted the following in the patient's chart:   . Medical and social history . Use of alcohol, tobacco or illicit drugs  . Current medications and supplements . Functional ability and status . Nutritional status . Physical activity . Advanced directives . List of other physicians . Hospitalizations, surgeries, and ER visits in previous 12 months . Vitals . Screenings to include cognitive, depression, and falls . Referrals and appointments  In addition, I have  reviewed and discussed with patient certain preventive protocols, quality metrics, and best practice recommendations. A written personalized care plan for preventive services as well as general preventive health recommendations were provided to patient.     Shela Nevin, [place]  06/20/2017

## 2017-06-24 ENCOUNTER — Other Ambulatory Visit: Payer: Self-pay | Admitting: Family

## 2017-06-24 ENCOUNTER — Ambulatory Visit (INDEPENDENT_AMBULATORY_CARE_PROVIDER_SITE_OTHER): Payer: Medicare Other | Admitting: Family

## 2017-06-24 ENCOUNTER — Encounter: Payer: Self-pay | Admitting: Family

## 2017-06-24 VITALS — BP 144/79 | HR 66 | Temp 98.7°F | Resp 16 | Ht 64.0 in | Wt 232.0 lb

## 2017-06-24 DIAGNOSIS — M545 Low back pain: Secondary | ICD-10-CM | POA: Diagnosis not present

## 2017-06-24 DIAGNOSIS — Z1231 Encounter for screening mammogram for malignant neoplasm of breast: Secondary | ICD-10-CM

## 2017-06-24 MED ORDER — NYSTATIN 100000 UNIT/GM EX POWD: Freq: Two times a day (BID) | CUTANEOUS | 0 refills | Status: DC

## 2017-06-24 MED ORDER — MELOXICAM 7.5 MG PO TABS: 7.5000 mg | ORAL_TABLET | Freq: Every day | ORAL | 0 refills | Status: DC

## 2017-06-24 MED ORDER — METHOCARBAMOL 500 MG PO TABS: 500.0000 mg | ORAL_TABLET | Freq: Three times a day (TID) | ORAL | 0 refills | Status: DC | PRN

## 2017-06-24 NOTE — Progress Notes (Signed)
Subjective:    Patient ID: [name], female    DOB: 03/21/1961, 56 y.o.   MRN: [id number]  HPI  [name] is a 56 yr old female who presents today to discuss back pain. Pain has been present for several months.  Reports that pain is worsening. Had MRI 2012 which noted- Mild desiccation and bulging of the discs at L4-5 and L5-S1.  Reports that she has had some ESI a long time a go.  Reports that she has low back pain which radiates to the right lower back. It does not radiate down the right leg.   Worse with prolonged standing. She is using tylenol arthritis and Advil PM.    Review of Systems   See HPI  Past Medical History:  Diagnosis Date  . Allergy    allergic rhinitis  . Atypical chest pain   . Constipation   . Cyst, ovarian   . Diabetes mellitus, type II ([place])   . Endometriosis   . Esophageal stricture   . Gastroparesis   . GERD (gastroesophageal reflux disease)   . Hyperlipidemia   . Hypertension   . 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 Iron deficiency anemia   . Migraine headache   . Non-compliance   . Peptic ulcer disease      Social History   Socioeconomic History  . Marital status: Widowed    Spouse name: Not on file  . Number of children: 2  . Years of education: Not on file  . Highest education level: Not on file  Social Needs  . Financial resource strain: Not on file  . Food insecurity - worry: Not on file  . Food insecurity - inability: Not on file  . Transportation needs - medical: Not on file  . Transportation needs - non-medical: Not on file  Occupational History  . Occupation: DISABILITY    Employer: UNEMPLOYED  Tobacco Use  . Smoking status: Former Smoker    Packs/day: 0.50    Years: 18.00    Pack years: 9.00    Types: Cigarettes    Start date: 07/29/1977    Last attempt to quit: 06/25/1994    Years since quitting: 23.0  . Smokeless tobacco: Never Used  . Tobacco comment:  quit 19 years ago  Substance and Sexual Activity  . Alcohol use: Yes    Alcohol/week: 0.0 oz    Comment: social drinker  . Drug use: No  . Sexual activity: Not on file  Other Topics Concern  . Not on file  Social History Narrative   Married    Has 2 grown children.  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   Former Smoker Quit tobacco in 1996.  She was a pack a day smoker for approximately 10 years.   Alcohol use-yes: Social    Daily Caffeine Use:6 pack of pepsi daily     Illicit Drug Use - no    Patient does not get regular exercise.       Smoking Status:  quit    Past Surgical History:  Procedure Laterality Date  . ABDOMINAL EXPLORATION SURGERY     w/bso   . CARDIAC CATHETERIZATION  2009   mild non obstructive CAD  . CHOLECYSTECTOMY    . KNEE SURGERY  2005    left knee  . TOTAL ABDOMINAL HYSTERECTOMY      Family History  Problem Relation Age of Onset  . Hypertension Mother   .  Rheum arthritis Mother   . Hypertension Father   . Diabetes Father   . Prostate cancer Father   . Kidney disease Father   . Diabetes Sister   . Fibromyalgia Sister   . Diabetes Maternal Grandmother   . Lung cancer Maternal Grandfather   . Colon cancer Neg Hx   . Thyroid disease Neg Hx     Allergies  Allergen Reactions  . Ace Inhibitors     REACTION: chronic cough  . Celebrex [Celecoxib] Other (See Comments)    "makes me bleed"  . Diovan [Valsartan]     angioedema    Current Outpatient Medications on File Prior to Visit  Medication Sig Dispense Refill  . amitriptyline (ELAVIL) 50 MG tablet Take 1 tablet (50 mg total) by mouth at bedtime. 90 tablet 1  . amLODipine (NORVASC) 5 MG tablet Take 1 tablet (5 mg total) by mouth daily. 90 tablet 1  . aspirin EC 81 MG tablet Take 81 mg by mouth daily.    . hydrochlorothiazide (HYDRODIURIL) 25 MG tablet Take 1 tablet (25 mg total) by mouth daily. 90 tablet 0  . linaclotide (LINZESS) 145 MCG CAPS capsule Take 1 capsule (145 mcg total) by  mouth daily before breakfast. 12 capsule 0  . methimazole (TAPAZOLE) 5 MG tablet Take 1 tablet (5 mg total) 3 (three) times a week by mouth. 12 tablet 5  . ondansetron (ZOFRAN ODT) 4 MG disintegrating tablet Take 1 tablet (4 mg total) by mouth every 8 (eight) hours as needed for nausea or vomiting. 20 tablet 0  . potassium chloride SA (K-DUR,KLOR-CON) 20 MEQ tablet Take 1 tablet (20 mEq total) by mouth daily. 30 tablet 5  . promethazine (PHENADOZ) 25 MG suppository INSERT ONE SUPPOSITORY RECTALLY AS NEEDED 12 suppository 0  . rosuvastatin (CRESTOR) 40 MG tablet Take 1 tablet (40 mg total) by mouth every other day. 15 tablet 1  . SUMAtriptan (IMITREX) 50 MG tablet May repeat in 2 hours if headache not resolved. Max 2 tabs/24 hrs 10 tablet 2  . traZODone (DESYREL) 50 MG tablet Take 0.5-1 tablets (25-50 mg total) by mouth at bedtime as needed for sleep. 30 tablet 3  . [DISCONTINUED] atorvastatin (LIPITOR) 40 MG tablet Take 1 tablet (40 mg total) by mouth daily. 30 tablet 3  . [DISCONTINUED] famotidine (PEPCID) 20 MG tablet Take 1 tablet (20 mg total) by mouth 2 (two) times daily. 30 tablet 0   No current facility-administered medications on file prior to visit.     BP (!) 144/79 (BP Location: Right Arm, Patient Position: Sitting, Cuff Size: Large)   Pulse 66   Temp 98.7 F (37.1 C) (Oral)   Resp 16   Ht 5\' 4"  (1.626 m)   Wt 232 lb (105.2 kg)   SpO2 98%   BMI 39.82 kg/m        Objective:   Physical Exam  Constitutional: She is oriented to person, place, and time. She appears well-developed and well-nourished.  Cardiovascular: Normal rate, regular rhythm and normal heart sounds.  No murmur heard. Pulmonary/Chest: Effort normal and breath sounds normal. No respiratory distress. She has no wheezes.  Musculoskeletal: She exhibits no edema.  Neurological: She is alert and oriented to person, place, and time.  Reflex Scores:      Patellar reflexes are 2+ on the right side and 2+ on the  left side. Bilateral LE strength is 5/5  Psychiatric: She has a normal mood and affect. Her behavior is normal. Judgment and thought  content normal.          Assessment & Plan:  Low back pain- advised pt as follows:  Please begin meloxicam once daily for your back pain. You may use Robaxin (muscle relaxer) as needed.  This may cause drowsiness to use in the evenings when you are planning to be home. You should be contacted by a referral to physical therapy. Please call if new or worsening symptoms. Avoid use of Advil since we are giving you another anti-inflammatory. (had hx of gi bleed in the past on celebrex so will plan to give meloxicam for short term use only). You may use Tylenol as needed. Call if new/worsening symptoms or if not improved in 1 month.

## 2017-06-24 NOTE — Patient Instructions (Addendum)
Please begin meloxicam once daily for your back pain. You may use Robaxin (muscle relaxer) as needed.  This may cause drowsiness to use in the evenings when you are planning to be home. You should be contacted by a referral to physical therapy. Please call if new or worsening symptoms. Avoid use of Advil since we are giving you another anti-inflammatory. You may use Tylenol as needed. Call if new/worsening symptoms or if not improved in 1 month.

## 2017-06-28 ENCOUNTER — Ambulatory Visit: Payer: Medicare Other | Admitting: *Deleted

## 2017-06-28 NOTE — Progress Notes (Signed)
Subjective:   [name] is a 57 y.o. female who presents for a Medicare Annual Wellness Visit. The Patient was informed that the wellness visit is to identify future health risk and educate and initiate measures that can reduce risk for increased disease through the lifespan.   Describes health as fair, good or great? good  Review of Systems No ROS.  Medicare Wellness Visit. Additional risk factors are reflected in the social history. Cardiac Risk Factors include: advanced age (>46men, >14 women);hypertension;dyslipidemia;obesity (BMI >30kg/m2);sedentary lifestyle Sleep patterns: Takes Trazodone as needed for sleep. Tries to sleep at least 8 hrs per night. Home Safety/Smoke Alarms: Feels safe in home. Smoke alarms in place. Lives alone. 5 stairs coming in with banisters. No issues.  Female:   Pap- hysterectomy       Mammo- Scheduled 07/06/17.      Dexa scan- Last 05/07/16: osteopenia       CCS- Last 2010. 5 yr recall. Scheduled for 07/16/17   Objective:    Today's Vitals   07/04/17 0824  BP: 120/76  Pulse: 64  SpO2: 95%  Weight: 227 lb 3.2 oz (103.1 kg)  Height: 5\' 4"  (1.626 m)   Body mass index is 39 kg/m.  Advanced Directives 07/04/2017 07/02/2017 01/07/2017 12/14/2016 08/13/2016 03/30/2016 11/18/2015  Does Patient Have a Medical Advance Directive? No Yes No Yes No No Yes  Type of Advance Directive - [place];Living will - [place];Living will - - -  Does patient want to make changes to medical advance directive? No - Patient declined No - Patient declined - - - - -  Copy of [place] in Chart? - Yes - - - - -  Would patient like information on creating a medical advance directive? - - - - Yes (MAU/Ambulatory/Procedural Areas - Information given) - -    Current Medications (verified) Outpatient Encounter Medications as of 07/04/2017  Medication Sig  . amitriptyline (ELAVIL) 50 MG tablet Take 1 tablet (50 mg total)  by mouth at bedtime.  Marland Kitchen amLODipine (NORVASC) 5 MG tablet Take 1 tablet (5 mg total) by mouth daily.  Marland Kitchen aspirin EC 81 MG tablet Take 81 mg by mouth daily.  . hydrochlorothiazide (HYDRODIURIL) 25 MG tablet Take 1 tablet (25 mg total) by mouth daily.  Marland Kitchen linaclotide (LINZESS) 145 MCG CAPS capsule Take 1 capsule (145 mcg total) by mouth daily before breakfast.  . meloxicam (MOBIC) 7.5 MG tablet Take 1 tablet (7.5 mg total) by mouth daily.  . methimazole (TAPAZOLE) 5 MG tablet Take 1 tablet (5 mg total) 3 (three) times a week by mouth.  . methocarbamol (ROBAXIN) 500 MG tablet Take 1 tablet (500 mg total) by mouth every 8 (eight) hours as needed for muscle spasms.  Marland Kitchen nystatin (MYCOSTATIN/NYSTOP) powder Apply topically 2 (two) times daily.  . ondansetron (ZOFRAN ODT) 4 MG disintegrating tablet Take 1 tablet (4 mg total) by mouth every 8 (eight) hours as needed for nausea or vomiting.  . potassium chloride SA (K-DUR,KLOR-CON) 20 MEQ tablet Take 1 tablet (20 mEq total) by mouth daily.  . promethazine (PHENADOZ) 25 MG suppository INSERT ONE SUPPOSITORY RECTALLY AS NEEDED  . rosuvastatin (CRESTOR) 40 MG tablet Take 1 tablet (40 mg total) by mouth every other day.  . SUMAtriptan (IMITREX) 50 MG tablet May repeat in 2 hours if headache not resolved. Max 2 tabs/24 hrs  . traZODone (DESYREL) 50 MG tablet Take 0.5-1 tablets (25-50 mg total) by mouth at bedtime  as needed for sleep.   No facility-administered encounter medications on file as of 07/04/2017.     Allergies (verified) Ace inhibitors; Celebrex [celecoxib]; and Diovan [valsartan]   History: Past Medical History:  Diagnosis Date  . Allergy    allergic rhinitis  . Atypical chest pain   . Constipation   . Cyst, ovarian   . Diabetes mellitus, type II ([place])   . Endometriosis   . Esophageal stricture   . Gastroparesis   . GERD (gastroesophageal reflux disease)   . Hyperlipidemia   . Hypertension   . 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 Iron deficiency anemia   . Migraine headache   . Non-compliance   . Peptic ulcer disease    Past Surgical History:  Procedure Laterality Date  . ABDOMINAL EXPLORATION SURGERY     w/bso   . CARDIAC CATHETERIZATION  2009   mild non obstructive CAD  . CHOLECYSTECTOMY    . KNEE SURGERY  2005    left knee  . TOTAL ABDOMINAL HYSTERECTOMY     Family History  Problem Relation Age of Onset  . Hypertension Mother   . Rheum arthritis Mother   . Hypertension Father   . Diabetes Father   . Prostate cancer Father   . Kidney disease Father   . Diabetes Sister   . Fibromyalgia Sister   . Diabetes Maternal Grandmother   . Lung cancer Maternal Grandfather   . Colon cancer Neg Hx   . Thyroid disease Neg Hx    Social History   Socioeconomic History  . Marital status: Widowed    Spouse name: None  . Number of children: 2  . Years of education: None  . Highest education level: None  Social Needs  . Financial resource strain: None  . Food insecurity - worry: None  . Food insecurity - inability: None  . Transportation needs - medical: None  . Transportation needs - non-medical: None  Occupational History  . Occupation: DISABILITY    Employer: UNEMPLOYED  Tobacco Use  . Smoking status: Former Smoker    Packs/day: 0.50    Years: 18.00    Pack years: 9.00    Types: Cigarettes    Start date: 07/29/1977    Last attempt to quit: 06/25/1994    Years since quitting: 23.0  . Smokeless tobacco: Never Used  . Tobacco comment: quit 19 years ago  Substance and Sexual Activity  . Alcohol use: Yes    Alcohol/week: 0.0 oz    Comment: social drinker  . Drug use: No  . Sexual activity: Not Currently  Other Topics Concern  . None  Social History Narrative   Married    Has 2 grown children.  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   Former Smoker Quit tobacco in 1996.  She was a pack a day smoker for approximately 10 years.   Alcohol  use-yes: Social    Daily Caffeine Use:6 pack of pepsi daily     Illicit Drug Use - no    Patient does not get regular exercise.       Smoking Status:  quit   Tobacco Counseling Counseling given: Not Answered Comment: quit 19 years ago   Clinical Intake: Pain : No/denies pain  Activities of Daily Living In your present state of health, do you have any difficulty performing the following activities: 07/04/2017  Hearing? N  Vision? N  Comment Reading glasses.  Difficulty concentrating or  making decisions? N  Walking or climbing stairs? N  Dressing or bathing? N  Doing errands, shopping? N  Preparing Food and eating ? N  Using the Toilet? N  In the past six months, have you accidently leaked urine? N  Do you have problems with loss of bowel control? N  Managing your Medications? N  Managing your Finances? N  Housekeeping or managing your Housekeeping? N  Some recent data might be hidden     Immunizations and Health Maintenance Immunization History  Administered Date(s) Administered  . Influenza Split 04/30/2011  . Influenza Whole 04/19/2006, 04/17/2007, 03/08/2009, 03/01/2010  . Influenza, Seasonal, Injecte, Preservative Fre 05/30/2012  . Influenza,inj,Quad PF,6+ Mos 02/15/2014, 03/08/2015, 04/30/2016, 04/24/2017  . Pneumococcal Polysaccharide-23 03/08/2009  . Td 05/17/1999  . Tdap 04/30/2011   There are no preventive care reminders to display for this patient.  Patient Care Team: [name], NP as PCP - General (Internal Medicine) [name], MD as Consulting Physician (Internal Medicine) Amalia Greenhouse, MD as Consulting Physician (Endocrinology) [name], MD as Consulting Physician (Oncology)  Indicate any recent Medical Services you may have received from other than [facility] providers in the past year (date may be approximate).    Assessment:   This is a routine wellness examination for [name]. Physical assessment deferred to PCP.  Dietary  issues and exercise activities discussed: Current Exercise Habits: The patient does not participate in regular exercise at present, Exercise limited by: None identified   Diet (meal preparation, eat out, water intake, caffeinated beverages, dairy products, fruits and vegetables): well balanced   Goals    . Increase physical activity    . Reduce salt intake to 2 grams per day or less    . Reduce sugar intake      Depression Screen PHQ 2/9 Scores 07/04/2017 04/24/2017 02/11/2015  PHQ - 2 Score 0 0 0  PHQ- 9 Score - 5 -    Fall Risk Fall Risk  07/04/2017 11/18/2015 11/16/2015 08/16/2015 04/08/2015  Falls in the past year? No No No No No    Cognitive Function: MMSE - Mini Mental State Exam 02/11/2015  Not completed: (No Data)  Ad8 score reviewed for issues:  Issues making decisions:no  Less interest in hobbies / activities:no  Repeats questions, stories (family complaining):no  Trouble using ordinary gadgets (microwave, computer, phone):no  Forgets the month or year: no  Mismanaging finances: no  Remembering appts:no  Daily problems with thinking and/or memory:no Ad8 score is=0      Screening Tests Health Maintenance  Topic Date Due  . Hepatitis C Screening  05/24/2018 (Originally [birth year])  . HIV Screening  05/24/2018 (Originally 09/13/1975)  . MAMMOGRAM  05/07/2018  . COLONOSCOPY  12/30/2018  . TETANUS/TDAP  04/29/2021  . INFLUENZA VACCINE  Completed      Plan:   Follow up with Melissa NP as scheduled 09/24/17.  Continue to eat heart healthy diet (full of fruits, vegetables, whole grains, lean protein, water--limit salt, fat, and sugar intake) and increase physical activity as tolerated.  Continue doing brain stimulating activities (puzzles, reading, adult coloring books, staying active) to keep memory sharp.    I have personally reviewed and noted the following in the patient's chart:   . Medical and social history . Use of alcohol, tobacco or illicit drugs    . Current medications and supplements . Functional ability and status . Nutritional status . Physical activity . Advanced directives . List of other physicians . Hospitalizations, surgeries, and ER visits  in previous 12 months . Vitals . Screenings to include cognitive, depression, and falls . Referrals and appointments  In addition, I have reviewed and discussed with patient certain preventive protocols, quality metrics, and best practice recommendations. A written personalized care plan for preventive services as well as general preventive health recommendations were provided to patient.     Shela Nevin, [place]   07/04/2017

## 2017-06-29 ENCOUNTER — Ambulatory Visit (HOSPITAL_BASED_OUTPATIENT_CLINIC_OR_DEPARTMENT_OTHER): Payer: Medicare Other

## 2017-07-02 ENCOUNTER — Ambulatory Visit: Payer: Medicare PPO | Attending: Family | Admitting: Physical Therapy

## 2017-07-02 ENCOUNTER — Telehealth: Payer: Self-pay | Admitting: Family

## 2017-07-02 ENCOUNTER — Other Ambulatory Visit: Payer: Self-pay

## 2017-07-02 ENCOUNTER — Encounter: Payer: Self-pay | Admitting: Physical Therapy

## 2017-07-02 DIAGNOSIS — M545 Low back pain, unspecified: Secondary | ICD-10-CM

## 2017-07-02 DIAGNOSIS — M6281 Muscle weakness (generalized): Secondary | ICD-10-CM | POA: Diagnosis present

## 2017-07-02 DIAGNOSIS — G8929 Other chronic pain: Secondary | ICD-10-CM | POA: Insufficient documentation

## 2017-07-02 DIAGNOSIS — M62830 Muscle spasm of back: Secondary | ICD-10-CM | POA: Insufficient documentation

## 2017-07-02 NOTE — Telephone Encounter (Signed)
Notified pt that Rx was sent on 06/24/17 and on our end it transmitted "normal". Advised pt to see if pharmacy placed Rx on hold and let us know if they still don't have it. Pt voices understanding.

## 2017-07-02 NOTE — Telephone Encounter (Signed)
Copied from [place] [id number]. Topic: Quick Communication - See Telephone Encounter >> Jul 02, 2017  9:47 AM Rosalin Hawking wrote: CRM for notification. See Telephone encounter for:  07/02/17.   Pt came in office stating that during her last visit that she was going to get a rx or a sample a powder that Melissa explain to pt that will help her out with irritation under her breast. Pt would like to have the rx sent to pharmacy ASAP or to get the sample for the powder asap.  Please advise.

## 2017-07-02 NOTE — Therapy (Signed)
[facility] [address]  [place], [place], [address] Phone: [phone]   Fax:  [phone]  Physical Therapy Evaluation  Patient Details  Name: [name] MRN: [id number] Date of Birth: 08-Oct-1960 Referring Provider: [name], NP   Encounter Date: 07/02/2017  PT End of Session - 07/02/17 0850    Visit Number  1    Number of Visits  12    Date for PT Re-Evaluation  08/16/17    Authorization Type  [employer] Medicare    PT Start Time  0850    PT Stop Time  0933    PT Time Calculation (min)  43 min    Activity Tolerance  Patient tolerated treatment well    Behavior During Therapy  [facility] for tasks assessed/performed       Past Medical History:  Diagnosis Date  . Allergy    allergic rhinitis  . Atypical chest pain   . Constipation   . Cyst, ovarian   . Diabetes mellitus, type II ([place])   . Endometriosis   . Esophageal stricture   . Gastroparesis   . GERD (gastroesophageal reflux disease)   . Hyperlipidemia   . Hypertension   . 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 Iron deficiency anemia   . Migraine headache   . Non-compliance   . Peptic ulcer disease     Past Surgical History:  Procedure Laterality Date  . ABDOMINAL EXPLORATION SURGERY     w/bso   . CARDIAC CATHETERIZATION  2009   mild non obstructive CAD  . CHOLECYSTECTOMY    . KNEE SURGERY  2005    left knee  . TOTAL ABDOMINAL HYSTERECTOMY      There were no vitals filed for this visit.   Subjective Assessment - 07/02/17 0853    Subjective  Pt reports ongoing LBP for years getting worse over last couple months w/o known trigger. Denies radicular pain or numbness/tingling.    Limitations  Sitting;Standing;Walking;House hold activities    How long can you sit comfortably?  1 hr    How long can you stand comfortably?  1 hr    Diagnostic tests  No recent imaging; Lumbar MRI  10/18/10: No likely significant finding.  Mild desiccation and bulging of the discs at L4-5 and L5-S1.  No stenosis of the canal or foramina.    Patient Stated Goals  "get a whole lot better"    Currently in Pain?  Yes    Pain Score  6     Pain Location  Back    Pain Orientation  Lower;Right    Pain Descriptors / Indicators  Stabbing    Pain Type  Acute pain;Chronic pain    Pain Radiating Towards  n/a    Pain Onset  More than a month ago    Pain Frequency  Constant    Aggravating Factors   unpredictable    Pain Relieving Factors  heat    Effect of Pain on Daily Activities  "pushes through pain & deal with it afterwards"         [facility] PT Assessment - 07/02/17 0850      Assessment   Medical Diagnosis  Low back pain w/o sciatica    Referring Provider  [name], NP    Onset Date/Surgical Date  -- several yrs, worsening over past few months    Next MD Visit  09/24/17  Prior Therapy  none for current condition      Balance Screen   Has the patient fallen in the past 6 months  No    Has the patient had a decrease in activity level because of a fear of falling?   No    Is the patient reluctant to leave their home because of a fear of falling?   No      Home Environment   Living Environment  Private residence    Living Arrangements  Alone    Type of [place] Access  Stairs to enter    Entrance Stairs-Number of Steps  6    Entrance Stairs-Rails  Right;Left;Can reach both    [place]  One level      Prior Function   Level of Independence  Independent    Vocation  On disability    Vocation Requirements  disability due to back, knee, migraines, etc    Leisure  "piddling"; used to work-out 5x/wk before her knee was a problem      Observation/Other Assessments   Focus on Therapeutic Outcomes (FOTO)   Lumbar - 57% (43% limitation); predicted 63% (37% limitation) in 11 visits      ROM / Strength   AROM / PROM / Strength  AROM;Strength      AROM   AROM  Assessment Site  Lumbar    Lumbar Flexion  hands to ankles - tight    Lumbar Extension  WFL    Lumbar - Right Side Bend  hand distal to lat knee    Lumbar - Right Rotation  WFL    Lumbar - Left Rotation  [facility] [place]      Strength   Strength Assessment Site  Hip;Knee    Right/Left Hip  Right;Left    Right Hip Flexion  3+/5    Right Hip Extension  4-/5    Right Hip External Rotation   3+/5    Right Hip Internal Rotation  3+/5    Right Hip ABduction  4-/5    Right Hip ADduction  3/5    Left Hip Flexion  4-/5    Left Hip Extension  3+/5    Left Hip External Rotation  3+/5    Left Hip Internal Rotation  3+/5    Left Hip ABduction  4-/5    Left Hip ADduction  3+/5    Right/Left Knee  Right;Left    Right Knee Flexion  4/5    Right Knee Extension  4-/5    Left Knee Flexion  4/5    Left Knee Extension  4-/5      Flexibility   Soft Tissue Assessment /Muscle Length  yes    Hamstrings  mod tight R>L    Quadriceps  mod tight B    ITB  mod tight B    Piriformis  mod tight R>L             Objective measurements completed on examination: See above findings.      [facility] Adult PT Treatment/Exercise - 07/02/17 0850      Exercises   Exercises  Lumbar      Lumbar Exercises: Stretches   Passive Hamstring Stretch  30 seconds;1 rep    Passive Hamstring Stretch Limitations  supine with strap    Single Knee to Chest Stretch  30 seconds;1 rep    Single Knee to Chest Stretch Limitations  opp LE straight    Lower Trunk Rotation  30 seconds;1 rep    ITB Stretch  30 seconds;1 rep    ITB Stretch Limitations  supine with strap    Piriformis Stretch  30 seconds;1 rep each    Piriformis Stretch Limitations  figure 4 with strap & KTOS      Lumbar Exercises: Supine   Ab Set  10 reps;5 seconds    AB Set Limitations  + pelvic tilt    Bridge  10 reps;5 seconds    Bridge Limitations  + hip add pillow squeeze             PT Education - 07/02/17 0933    Education provided  Yes    Education  Details  PT eval findings, anticipated POC & initial HEP    Person(s) Educated  Patient    Methods  Explanation;Demonstration;Verbal cues;Handout    Comprehension  Verbalized understanding;Returned demonstration;Verbal cues required;Need further instruction       PT Short Term Goals - 07/02/17 0944      PT SHORT TERM GOAL #1   Title  Independent with initial HEP    Status  New    Target Date  07/19/17        PT Long Term Goals - 07/02/17 0944      PT LONG TERM GOAL #1   Title  Independent with ongoing HEP +/- gym program    Status  New    Target Date  08/16/17      PT LONG TERM GOAL #2   Title  Pt will verbalize/demonstrate understanding of neutral spine posture and proper body mechanics for typical daily tasks to reduce limbar strain    Status  New    Target Date  08/16/17      PT LONG TERM GOAL #3   Title  B LE strength >/= 4/5 for improved function    Status  New    Target Date  08/16/17      PT LONG TERM GOAL #4   Title  Pt will report at least 50% improvement in LBP to allow for decreased pain interference with daily tasks    Status  New    Target Date  08/16/17             Plan - 07/02/17 0939    Clinical Impression Statement  [name] is a 57 y/o female referred to OP PT for acute on chronic R sided LBP w/o sciatica or radiculopathy. Pain at 6/10 on eval but pt unable to identify aggravating or alieving factors. Pt demonstrates postural abnormalities, impaired flexibility, decreased ROM and strength limiting activity tolerance and ability to participate in desire exercise and/or leisure activities.  Pt will benefit from skilled PT to address deficits listed.    History and Personal Factors relevant to plan of care:  fibromyalgia; knee pain; obesity + PMH as above    Clinical Presentation  Evolving    Clinical Presentation due to:  worsening of LBP over recent months + complex medical history    Clinical Decision Making  Moderate    Rehab Potential  Good     PT Frequency  2x / week    PT Duration  6 weeks    PT Treatment/Interventions  Patient/family education;ADLs/Self Care Home Management;Neuromuscular re-education;Therapeutic exercise;Therapeutic activities;Functional mobility training;Balance training;Manual techniques;Dry needling;Taping;Electrical Stimulation;Moist Heat;Cryotherapy;Ultrasound;Iontophoresis 4mg /ml Dexamethasone;Traction    Consulted and Agree with Plan of Care  Patient       Patient will benefit from skilled therapeutic intervention in order to improve the following deficits  and impairments:  Pain, Impaired flexibility, Increased muscle spasms, Decreased range of motion, Decreased strength, Postural dysfunction, Improper body mechanics, Decreased mobility, Decreased activity tolerance  Visit Diagnosis: Chronic right-sided low back pain without sciatica  Muscle spasm of back  Muscle weakness (generalized)     Problem List Patient Active Problem List   Diagnosis Date Noted  . Insomnia 01/12/2017  . Hyperglycemia 10/07/2014  . HTN (hypertension) 09/18/2012  . Breast cyst 06/09/2012  . Hyperthyroidism 02/29/2012  . Unspecified constipation 04/18/2011  . Can't get food down 04/18/2011  . Ulnar neuropathy 03/30/2011  . Low back pain 10/18/2010  . FIBROMYALGIA 07/20/2009  . Hyperlipidemia 05/26/2009  . VITAMIN D DEFICIENCY 05/23/2009  . Iron deficiency anemia due to chronic blood loss 10/25/2008  . GERD 10/25/2008  . LIVER HEMANGIOMA 01/01/2008  . ESOPHAGEAL STRICTURE 01/01/2008  . GASTROPARESIS 01/01/2008  . ALLERGIC RHINITIS 12/08/2007  . Borderline type 2 diabetes mellitus 10/31/2007  . Migraine headache 08/11/2007  . HIATAL HERNIA 08/11/2007  . OVARIAN CYST 08/11/2007  . Essential hypertension, benign 05/06/2007    Percival Spanish, PT, MPT 07/02/2017, 9:57 AM  [facility] [address]  [place], [place], [address] Phone: [phone]   Fax:   [phone]  Name: [name] MRN: [id number] Date of Birth: [birth year]

## 2017-07-04 ENCOUNTER — Ambulatory Visit: Payer: Medicare PPO

## 2017-07-04 ENCOUNTER — Encounter: Payer: Self-pay | Admitting: *Deleted

## 2017-07-04 ENCOUNTER — Ambulatory Visit (INDEPENDENT_AMBULATORY_CARE_PROVIDER_SITE_OTHER): Payer: BC Managed Care – PPO | Admitting: *Deleted

## 2017-07-04 VITALS — BP 120/76 | HR 64 | Ht 64.0 in | Wt 227.2 lb

## 2017-07-04 DIAGNOSIS — Z0000 Encounter for general adult medical examination without abnormal findings: Secondary | ICD-10-CM

## 2017-07-04 DIAGNOSIS — M545 Low back pain, unspecified: Secondary | ICD-10-CM

## 2017-07-04 DIAGNOSIS — M62830 Muscle spasm of back: Secondary | ICD-10-CM

## 2017-07-04 DIAGNOSIS — G8929 Other chronic pain: Secondary | ICD-10-CM

## 2017-07-04 DIAGNOSIS — M6281 Muscle weakness (generalized): Secondary | ICD-10-CM

## 2017-07-04 NOTE — Patient Instructions (Signed)
Follow up with [name] as scheduled 09/24/17.  Continue to eat heart healthy diet (full of fruits, vegetables, whole grains, lean protein, water--limit salt, fat, and sugar intake) and increase physical activity as tolerated.  Continue doing brain stimulating activities (puzzles, reading, adult coloring books, staying active) to keep memory sharp.    [name] , Thank you for taking time to come for your Medicare Wellness Visit. I appreciate your ongoing commitment to your health goals. Please review the following plan we discussed and let me know if I can assist you in the future.   These are the goals we discussed: Goals    . Increase physical activity    . Reduce salt intake to 2 grams per day or less    . Reduce sugar intake       This is a list of the screening recommended for you and due dates:  Health Maintenance  Topic Date Due  .  Hepatitis C: One time screening is recommended by Center for Disease Control  (CDC) for  adults born from [birth year] through 1967.   05/24/2018*  . HIV Screening  05/24/2018*  . Mammogram  05/07/2018  . Colon Cancer Screening  12/30/2018  . Tetanus Vaccine  04/29/2021  . Flu Shot  Completed  *Topic was postponed. The date shown is not the original due date.    Low-Sodium Eating Plan Sodium, which is an element that makes up salt, helps you maintain a healthy balance of fluids in your body. Too much sodium can increase your blood pressure and cause fluid and waste to be held in your body. Your health care provider or dietitian may recommend following this plan if you have high blood pressure (hypertension), kidney disease, liver disease, or heart failure. Eating less sodium can help lower your blood pressure, reduce swelling, and protect your heart, liver, and kidneys. What are tips for following this plan? General guidelines  Most people on this plan should limit their sodium intake to 1,500-2,000 mg (milligrams) of sodium each day. Reading food  labels  The Nutrition Facts label lists the amount of sodium in one serving of the food. If you eat more than one serving, you must multiply the listed amount of sodium by the number of servings.  Choose foods with less than 140 mg of sodium per serving.  Avoid foods with 300 mg of sodium or more per serving. Shopping  Look for lower-sodium products, often labeled as "low-sodium" or "no salt added."  Always check the sodium content even if foods are labeled as "unsalted" or "no salt added".  Buy fresh foods. ? Avoid canned foods and premade or frozen meals. ? Avoid canned, cured, or processed meats  Buy breads that have less than 80 mg of sodium per slice. Cooking  Eat more home-cooked food and less restaurant, buffet, and fast food.  Avoid adding salt when cooking. Use salt-free seasonings or herbs instead of table salt or sea salt. Check with your health care provider or pharmacist before using salt substitutes.  Cook with plant-based oils, such as canola, sunflower, or olive oil. Meal planning  When eating at a restaurant, ask that your food be prepared with less salt or no salt, if possible.  Avoid foods that contain MSG (monosodium glutamate). MSG is sometimes added to [place] food, bouillon, and some canned foods. What foods are recommended? The items listed may not be a complete list. Talk with your dietitian about what dietary choices are best for you. Grains Low-sodium  cereals, including oats, puffed wheat and rice, and shredded wheat. Low-sodium crackers. Unsalted rice. Unsalted pasta. Low-sodium bread. Whole-grain breads and whole-grain pasta. Vegetables Fresh or frozen vegetables. "No salt added" canned vegetables. "No salt added" tomato sauce and paste. Low-sodium or reduced-sodium tomato and vegetable juice. Fruits Fresh, frozen, or canned fruit. Fruit juice. Meats and other protein foods Fresh or frozen (no salt added) meat, poultry, seafood, and fish. Low-sodium  canned tuna and salmon. Unsalted nuts. Dried peas, beans, and lentils without added salt. Unsalted canned beans. Eggs. Unsalted nut butters. Dairy Milk. Soy milk. Cheese that is naturally low in sodium, such as ricotta cheese, fresh mozzarella, or Swiss cheese Low-sodium or reduced-sodium cheese. Cream cheese. Yogurt. Fats and oils Unsalted butter. Unsalted margarine with no trans fat. Vegetable oils such as canola or olive oils. Seasonings and other foods Fresh and dried herbs and spices. Salt-free seasonings. Low-sodium mustard and ketchup. Sodium-free salad dressing. Sodium-free light mayonnaise. Fresh or refrigerated horseradish. Lemon juice. Vinegar. Homemade, reduced-sodium, or low-sodium soups. Unsalted popcorn and pretzels. Low-salt or salt-free chips. What foods are not recommended? The items listed may not be a complete list. Talk with your dietitian about what dietary choices are best for you. Grains Instant hot cereals. Bread stuffing, pancake, and biscuit mixes. Croutons. Seasoned rice or pasta mixes. Noodle soup cups. Boxed or frozen macaroni and cheese. Regular salted crackers. Self-rising flour. Vegetables Sauerkraut, pickled vegetables, and relishes. Olives. [place] fries. Onion rings. Regular canned vegetables (not low-sodium or reduced-sodium). Regular canned tomato sauce and paste (not low-sodium or reduced-sodium). Regular tomato and vegetable juice (not low-sodium or reduced-sodium). Frozen vegetables in sauces. Meats and other protein foods Meat or fish that is salted, canned, smoked, spiced, or pickled. Bacon, ham, sausage, hotdogs, corned beef, chipped beef, packaged lunch meats, salt pork, jerky, pickled herring, anchovies, regular canned tuna, sardines, salted nuts. Dairy Processed cheese and cheese spreads. Cheese curds. Blue cheese. Feta cheese. String cheese. Regular cottage cheese. Buttermilk. Canned milk. Fats and oils Salted butter. Regular margarine. Ghee. Bacon  fat. Seasonings and other foods Onion salt, garlic salt, seasoned salt, table salt, and sea salt. Canned and packaged gravies. Worcestershire sauce. Tartar sauce. Barbecue sauce. Teriyaki sauce. Soy sauce, including reduced-sodium. Steak sauce. Fish sauce. Oyster sauce. Cocktail sauce. Horseradish that you find on the shelf. Regular ketchup and mustard. Meat flavorings and tenderizers. Bouillon cubes. Hot sauce and Tabasco sauce. Premade or packaged marinades. Premade or packaged taco seasonings. Relishes. Regular salad dressings. Salsa. Potato and tortilla chips. Corn chips and puffs. Salted popcorn and pretzels. Canned or dried soups. Pizza. Frozen entrees and pot pies. Summary  Eating less sodium can help lower your blood pressure, reduce swelling, and protect your heart, liver, and kidneys.  Most people on this plan should limit their sodium intake to 1,500-2,000 mg (milligrams) of sodium each day.  Canned, boxed, and frozen foods are high in sodium. Restaurant foods, fast foods, and pizza are also very high in sodium. You also get sodium by adding salt to food.  Try to cook at home, eat more fresh fruits and vegetables, and eat less fast food, canned, processed, or prepared foods. This information is not intended to replace advice given to you by your health care provider. Make sure you discuss any questions you have with your health care provider. Document Released: 12/01/2001 Document Revised: 06/04/2016 Document Reviewed: 06/04/2016 Elsevier Interactive Patient Education  2018 [place] Maintenance for Postmenopausal Women Menopause is a normal process in which your reproductive ability  comes to an end. This process happens gradually over a span of months to years, usually between the ages of 30 and 70. Menopause is complete when you have missed 12 consecutive menstrual periods. It is important to talk with your health care provider about some of the most common conditions that  affect postmenopausal women, such as heart disease, cancer, and bone loss (osteoporosis). Adopting a healthy lifestyle and getting preventive care can help to promote your health and wellness. Those actions can also lower your chances of developing some of these common conditions. What should I know about menopause? During menopause, you may experience a number of symptoms, such as:  Moderate-to-severe hot flashes.  Night sweats.  Decrease in sex drive.  Mood swings.  Headaches.  Tiredness.  Irritability.  Memory problems.  Insomnia.  Choosing to treat or not to treat menopausal changes is an individual decision that you make with your health care provider. What should I know about hormone replacement therapy and supplements? Hormone therapy products are effective for treating symptoms that are associated with menopause, such as hot flashes and night sweats. Hormone replacement carries certain risks, especially as you become older. If you are thinking about using estrogen or estrogen with progestin treatments, discuss the benefits and risks with your health care provider. What should I know about heart disease and stroke? Heart disease, heart attack, and stroke become more likely as you age. This may be due, in part, to the hormonal changes that your body experiences during menopause. These can affect how your body processes dietary fats, triglycerides, and cholesterol. Heart attack and stroke are both medical emergencies. There are many things that you can do to help prevent heart disease and stroke:  Have your blood pressure checked at least every 1-2 years. High blood pressure causes heart disease and increases the risk of stroke.  If you are 97-31 years old, ask your health care provider if you should take aspirin to prevent a heart attack or a stroke.  Do not use any tobacco products, including cigarettes, chewing tobacco, or electronic cigarettes. If you need help quitting, ask  your health care provider.  It is important to eat a healthy diet and maintain a healthy weight. ? Be sure to include plenty of vegetables, fruits, low-fat dairy products, and lean protein. ? Avoid eating foods that are high in solid fats, added sugars, or salt (sodium).  Get regular exercise. This is one of the most important things that you can do for your health. ? Try to exercise for at least 150 minutes each week. The type of exercise that you do should increase your heart rate and make you sweat. This is known as moderate-intensity exercise. ? Try to do strengthening exercises at least twice each week. Do these in addition to the moderate-intensity exercise.  Know your numbers.Ask your health care provider to check your cholesterol and your blood glucose. Continue to have your blood tested as directed by your health care provider.  What should I know about cancer screening? There are several types of cancer. Take the following steps to reduce your risk and to catch any cancer development as early as possible. Breast Cancer  Practice breast self-awareness. ? This means understanding how your breasts normally appear and feel. ? It also means doing regular breast self-exams. Let your health care provider know about any changes, no matter how small.  If you are 49 or older, have a clinician do a breast exam (clinical breast exam or CBE)  every year. Depending on your age, family history, and medical history, it may be recommended that you also have a yearly breast X-ray (mammogram).  If you have a family history of breast cancer, talk with your health care provider about genetic screening.  If you are at high risk for breast cancer, talk with your health care provider about having an MRI and a mammogram every year.  Breast cancer (BRCA) gene test is recommended for women who have family members with BRCA-related cancers. Results of the assessment will determine the need for genetic  counseling and BRCA1 and for BRCA2 testing. BRCA-related cancers include these types: ? Breast. This occurs in males or females. ? Ovarian. ? Tubal. This may also be called fallopian tube cancer. ? Cancer of the abdominal or pelvic lining (peritoneal cancer). ? Prostate. ? Pancreatic.  Cervical, Uterine, and Ovarian Cancer Your health care provider may recommend that you be screened regularly for cancer of the pelvic organs. These include your ovaries, uterus, and vagina. This screening involves a pelvic exam, which includes checking for microscopic changes to the surface of your cervix (Pap test).  For women ages 21-65, health care providers may recommend a pelvic exam and a Pap test every three years. For women ages 42-65, they may recommend the Pap test and pelvic exam, combined with testing for human papilloma virus (HPV), every five years. Some types of HPV increase your risk of cervical cancer. Testing for HPV may also be done on women of any age who have unclear Pap test results.  Other health care providers may not recommend any screening for nonpregnant women who are considered low risk for pelvic cancer and have no symptoms. Ask your health care provider if a screening pelvic exam is right for you.  If you have had past treatment for cervical cancer or a condition that could lead to cancer, you need Pap tests and screening for cancer for at least 20 years after your treatment. If Pap tests have been discontinued for you, your risk factors (such as having a new sexual partner) need to be reassessed to determine if you should start having screenings again. Some women have medical problems that increase the chance of getting cervical cancer. In these cases, your health care provider may recommend that you have screening and Pap tests more often.  If you have a family history of uterine cancer or ovarian cancer, talk with your health care provider about genetic screening.  If you have  vaginal bleeding after reaching menopause, tell your health care provider.  There are currently no reliable tests available to screen for ovarian cancer.  Lung Cancer Lung cancer screening is recommended for adults 10-53 years old who are at high risk for lung cancer because of a history of smoking. A yearly low-dose CT scan of the lungs is recommended if you:  Currently smoke.  Have a history of at least 30 pack-years of smoking and you currently smoke or have quit within the past 15 years. A pack-year is smoking an average of one pack of cigarettes per day for one year.  Yearly screening should:  Continue until it has been 15 years since you quit.  Stop if you develop a health problem that would prevent you from having lung cancer treatment.  Colorectal Cancer  This type of cancer can be detected and can often be prevented.  Routine colorectal cancer screening usually begins at age 44 and continues through age 65.  If you have risk factors for  colon cancer, your health care provider may recommend that you be screened at an earlier age.  If you have a family history of colorectal cancer, talk with your health care provider about genetic screening.  Your health care provider may also recommend using home test kits to check for hidden blood in your stool.  A small camera at the end of a tube can be used to examine your colon directly (sigmoidoscopy or colonoscopy). This is done to check for the earliest forms of colorectal cancer.  Direct examination of the colon should be repeated every 5-10 years until age 24. However, if early forms of precancerous polyps or small growths are found or if you have a family history or genetic risk for colorectal cancer, you may need to be screened more often.  Skin Cancer  Check your skin from head to toe regularly.  Monitor any moles. Be sure to tell your health care provider: ? About any new moles or changes in moles, especially if there is a  change in a mole's shape or color. ? If you have a mole that is larger than the size of a pencil eraser.  If any of your family members has a history of skin cancer, especially at a young age, talk with your health care provider about genetic screening.  Always use sunscreen. Apply sunscreen liberally and repeatedly throughout the day.  Whenever you are outside, protect yourself by wearing long sleeves, pants, a wide-brimmed hat, and sunglasses.  What should I know about osteoporosis? Osteoporosis is a condition in which bone destruction happens more quickly than new bone creation. After menopause, you may be at an increased risk for osteoporosis. To help prevent osteoporosis or the bone fractures that can happen because of osteoporosis, the following is recommended:  If you are 13-69 years old, get at least 1,000 mg of calcium and at least 600 mg of vitamin D per day.  If you are older than age 21 but younger than age 79, get at least 1,200 mg of calcium and at least 600 mg of vitamin D per day.  If you are older than age 64, get at least 1,200 mg of calcium and at least 800 mg of vitamin D per day.  Smoking and excessive alcohol intake increase the risk of osteoporosis. Eat foods that are rich in calcium and vitamin D, and do weight-bearing exercises several times each week as directed by your health care provider. What should I know about how menopause affects my mental health? Depression may occur at any age, but it is more common as you become older. Common symptoms of depression include:  Low or sad mood.  Changes in sleep patterns.  Changes in appetite or eating patterns.  Feeling an overall lack of motivation or enjoyment of activities that you previously enjoyed.  Frequent crying spells.  Talk with your health care provider if you think that you are experiencing depression. What should I know about immunizations? It is important that you get and maintain your immunizations.  These include:  Tetanus, diphtheria, and pertussis (Tdap) booster vaccine.  Influenza every year before the flu season begins.  Pneumonia vaccine.  Shingles vaccine.  Your health care provider may also recommend other immunizations. This information is not intended to replace advice given to you by your health care provider. Make sure you discuss any questions you have with your health care provider. Document Released: 08/03/2005 Document Revised: 12/30/2015 Document Reviewed: 03/15/2015 Elsevier Interactive Patient Education  2018 [employer].

## 2017-07-04 NOTE — Therapy (Signed)
[facility] [address]  [place], [place], [address] Phone: [phone]   Fax:  [phone]  Physical Therapy Treatment  Patient Details  Name: [name] MRN: [id number] Date of Birth: [birth year] Referring Provider: [name], NP   Encounter Date: 07/04/2017  PT End of Session - 07/04/17 0937    Visit Number  2    Number of Visits  12    Date for PT Re-Evaluation  08/16/17    Authorization Type  [employer]HC Medicare    PT Start Time  0932    PT Stop Time  1010    PT Time Calculation (min)  38 min    Activity Tolerance  Patient tolerated treatment well    Behavior During Therapy  [facility] for tasks assessed/performed       Past Medical History:  Diagnosis Date  . Allergy    allergic rhinitis  . Atypical chest pain   . Constipation   . Cyst, ovarian   . Diabetes mellitus, type II ([place])   . Endometriosis   . Esophageal stricture   . Gastroparesis   . GERD (gastroesophageal reflux disease)   . Hyperlipidemia   . Hypertension   . 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 Iron deficiency anemia   . Migraine headache   . Non-compliance   . Peptic ulcer disease     Past Surgical History:  Procedure Laterality Date  . ABDOMINAL EXPLORATION SURGERY     w/bso   . CARDIAC CATHETERIZATION  2009   mild non obstructive CAD  . CHOLECYSTECTOMY    . KNEE SURGERY  2005    left knee  . TOTAL ABDOMINAL HYSTERECTOMY      There were no vitals filed for this visit.  Subjective Assessment - 07/04/17 0934    Subjective  Reports relief from LBP for remainder of day following last visit.      Diagnostic tests  No recent imaging; Lumbar MRI 10/18/10: No likely significant finding.  Mild desiccation and bulging of the discs at L4-5 and L5-S1.  No stenosis of the canal or foramina.    Patient Stated Goals  "get a whole lot better"    Currently in Pain?  Yes     Pain Score  5     Pain Location  Back    Pain Orientation  Lower;Right    Pain Descriptors / Indicators  Stabbing    Pain Type  Acute pain;Chronic pain    Pain Onset  More than a month ago    Pain Frequency  Constant    Aggravating Factors   unsure     Pain Score  6    Pain Location  Knee    Pain Orientation  Left    Pain Descriptors / Indicators  Constant;Aching    Pain Type  Chronic pain                      [facility] Adult PT Treatment/Exercise - 07/04/17 0943      Lumbar Exercises: Stretches   Passive Hamstring Stretch  30 seconds;1 rep    Passive Hamstring Stretch Limitations  supine with strap    Single Knee to Chest Stretch  30 seconds;1 rep    Single Knee to Chest Stretch Limitations  opp LE straight    Lower Trunk Rotation  30 seconds;1 rep each way    ITB Stretch  30  seconds;1 rep    ITB Stretch Limitations  supine with strap    Piriformis Stretch  30 seconds;1 rep each     Piriformis Stretch Limitations  figure 4 with strap & KTOS      Lumbar Exercises: Aerobic   Stationary Bike  NuStep: lvl 3, 7 min       Lumbar Exercises: Machines for Strengthening   Other Lumbar Machine Exercise  BATRCA low row 15# x 10 reps      Lumbar Exercises: Seated   Hip Flexion on Ball  Right;Left;10 reps    Hip Flexion on Ball Limitations  on green p-ball       Lumbar Exercises: Supine   Ab Set  10 reps;5 seconds    AB Set Limitations  + pelvic tilt + sustained hip abd/ER with red TB at knees     Bridge  5 seconds;15 reps    Bridge Limitations  + hip add pillow squeeze      Knee/Hip Exercises: Standing   Hip Flexion  Right;Left;Knee bent;10 reps    Hip Flexion Limitations  at counter; cues to activate abdominals     Hip Abduction  Right;Left;10 reps;Knee straight    Abduction Limitations  at counter; cues to activate abdominals     Hip Extension  Right;Left;10 reps;Knee straight    Extension Limitations  at counter; cues for abdominal activation                 PT Short Term Goals - 07/04/17 0937      PT SHORT TERM GOAL #1   Title  Independent with initial HEP    Status  On-going        PT Long Term Goals - 07/04/17 2355      PT LONG TERM GOAL #1   Title  Independent with ongoing HEP +/- gym program    Status  On-going      PT LONG TERM GOAL #2   Title  Pt will verbalize/demonstrate understanding of neutral spine posture and proper body mechanics for typical daily tasks to reduce limbar strain    Status  On-going      PT LONG TERM GOAL #3   Title  B LE strength >/= 4/5 for improved function    Status  On-going      PT LONG TERM GOAL #4   Title  Pt will report at least 50% improvement in LBP to allow for decreased pain interference with daily tasks    Status  On-going            Plan - 07/04/17 7322    Clinical Impression Statement  Pt. reporting good LBP relief for remainder of day following last visit.  Pt. with good overall technique with HEP review today however encouraged to use "firm strap or towel" to stretch LE as pt. currently using "stretchy band" for this at home.  Pt. with good tolerance for initiation of seated ball and standing hip strengthening activities today.  Ended treatment with significant decrease in LBP following therex.  Will continue to progress per pt. tolerance in coming visits.      PT Treatment/Interventions  Patient/family education;ADLs/Self Care Home Management;Neuromuscular re-education;Therapeutic exercise;Therapeutic activities;Functional mobility training;Balance training;Manual techniques;Dry needling;Taping;Electrical Stimulation;Moist Heat;Cryotherapy;Ultrasound;Iontophoresis 4mg /ml Dexamethasone;Traction    Consulted and Agree with Plan of Care  Patient       Patient will benefit from skilled therapeutic intervention in order to improve the following deficits and impairments:  Pain, Impaired flexibility, Increased muscle spasms, Decreased  range of motion, Decreased  strength, Postural dysfunction, Improper body mechanics, Decreased mobility, Decreased activity tolerance  Visit Diagnosis: Chronic right-sided low back pain without sciatica  Muscle spasm of back  Muscle weakness (generalized)     Problem List Patient Active Problem List   Diagnosis Date Noted  . Insomnia 01/12/2017  . Hyperglycemia 10/07/2014  . HTN (hypertension) 09/18/2012  . Breast cyst 06/09/2012  . Hyperthyroidism 02/29/2012  . Unspecified constipation 04/18/2011  . Can't get food down 04/18/2011  . Ulnar neuropathy 03/30/2011  . Low back pain 10/18/2010  . FIBROMYALGIA 07/20/2009  . Hyperlipidemia 05/26/2009  . VITAMIN D DEFICIENCY 05/23/2009  . Iron deficiency anemia due to chronic blood loss 10/25/2008  . GERD 10/25/2008  . LIVER HEMANGIOMA 01/01/2008  . ESOPHAGEAL STRICTURE 01/01/2008  . GASTROPARESIS 01/01/2008  . ALLERGIC RHINITIS 12/08/2007  . Borderline type 2 diabetes mellitus 10/31/2007  . Migraine headache 08/11/2007  . HIATAL HERNIA 08/11/2007  . OVARIAN CYST 08/11/2007  . Essential hypertension, benign 05/06/2007    Bess Harvest, PTA 07/04/17 12:00 PM   [facility] [address]  [place], [place], [address] Phone: [phone]   Fax:  [phone]  Name: KAISY SEVERINO MRN: [id number] Date of Birth: 1960/11/09

## 2017-07-06 ENCOUNTER — Ambulatory Visit (HOSPITAL_BASED_OUTPATIENT_CLINIC_OR_DEPARTMENT_OTHER)
Admission: RE | Admit: 2017-07-06 | Discharge: 2017-07-06 | Disposition: A | Discharge status: Home / Self Care | Payer: Medicare PPO | Source: Ambulatory Visit | Attending: Family | Admitting: Family

## 2017-07-06 DIAGNOSIS — Z1231 Encounter for screening mammogram for malignant neoplasm of breast: Secondary | ICD-10-CM | POA: Diagnosis present

## 2017-07-08 ENCOUNTER — Ambulatory Visit (INDEPENDENT_AMBULATORY_CARE_PROVIDER_SITE_OTHER): Payer: Medicare PPO | Admitting: Endocrinology

## 2017-07-08 ENCOUNTER — Encounter: Payer: Self-pay | Admitting: Endocrinology

## 2017-07-08 ENCOUNTER — Telehealth: Payer: Self-pay | Admitting: Family

## 2017-07-08 VITALS — BP 124/72 | HR 63 | Wt 227.0 lb

## 2017-07-08 DIAGNOSIS — E0590 Thyrotoxicosis, unspecified without thyrotoxic crisis or storm: Secondary | ICD-10-CM | POA: Diagnosis not present

## 2017-07-08 LAB — T4, FREE: Free T4: 0.95 ng/dL (ref 0.60–1.60)

## 2017-07-08 LAB — TSH: TSH: 0.56 u[IU]/mL (ref 0.35–4.50)

## 2017-07-08 NOTE — Progress Notes (Signed)
Subjective:    Patient ID: [name], female    DOB: Dec 07, 1960, 57 y.o.   MRN: [id number]  HPI  Pt returns for f/u of hyperthyroidism (dx'ed 2012; she chose tapazole rx; she has never had thyroid imaging).  she chooses tapazole rx. pt states she feels no different, and well in general.  Past Medical History:  Diagnosis Date  . Allergy    allergic rhinitis  . Atypical chest pain   . Constipation   . Cyst, ovarian   . Diabetes mellitus, type II ([place])   . Endometriosis   . Esophageal stricture   . Gastroparesis   . GERD (gastroesophageal reflux disease)   . Hyperlipidemia   . Hypertension   . 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 Iron deficiency anemia   . Migraine headache   . Non-compliance   . Peptic ulcer disease     Past Surgical History:  Procedure Laterality Date  . ABDOMINAL EXPLORATION SURGERY     w/bso   . CARDIAC CATHETERIZATION  2009   mild non obstructive CAD  . CHOLECYSTECTOMY    . KNEE SURGERY  2005    left knee  . TOTAL ABDOMINAL HYSTERECTOMY      Social History   Socioeconomic History  . Marital status: Widowed    Spouse name: Not on file  . Number of children: 2  . Years of education: Not on file  . Highest education level: Not on file  Social Needs  . Financial resource strain: Not on file  . Food insecurity - worry: Not on file  . Food insecurity - inability: Not on file  . Transportation needs - medical: Not on file  . Transportation needs - non-medical: Not on file  Occupational History  . Occupation: DISABILITY    Employer: UNEMPLOYED  Tobacco Use  . Smoking status: Former Smoker    Packs/day: 0.50    Years: 18.00    Pack years: 9.00    Types: Cigarettes    Start date: 07/29/1977    Last attempt to quit: 06/25/1994    Years since quitting: 23.0  . Smokeless tobacco: Never Used  . Tobacco comment: quit 19 years ago  Substance and Sexual Activity  . Alcohol use: Yes   Alcohol/week: 0.0 oz    Comment: social drinker  . Drug use: No  . Sexual activity: Not Currently  Other Topics Concern  . Not on file  Social History Narrative   Married    Has 2 grown children.  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   Former Smoker Quit tobacco in 1996.  She was a pack a day smoker for approximately 10 years.   Alcohol use-yes: Social    Daily Caffeine Use:6 pack of pepsi daily     Illicit Drug Use - no    Patient does not get regular exercise.       Smoking Status:  quit    Current Outpatient Medications on File Prior to Visit  Medication Sig Dispense Refill  . amitriptyline (ELAVIL) 50 MG tablet Take 1 tablet (50 mg total) by mouth at bedtime. 90 tablet 1  . amLODipine (NORVASC) 5 MG tablet Take 1 tablet (5 mg total) by mouth daily. 90 tablet 1  . aspirin EC 81 MG tablet Take 81 mg by mouth daily.    . hydrochlorothiazide (HYDRODIURIL) 25 MG tablet Take 1 tablet (25 mg total) by mouth daily. 90 tablet 0  .  linaclotide (LINZESS) 145 MCG CAPS capsule Take 1 capsule (145 mcg total) by mouth daily before breakfast. 12 capsule 0  . meloxicam (MOBIC) 7.5 MG tablet Take 1 tablet (7.5 mg total) by mouth daily. 14 tablet 0  . methimazole (TAPAZOLE) 5 MG tablet Take 1 tablet (5 mg total) 3 (three) times a week by mouth. 12 tablet 5  . methocarbamol (ROBAXIN) 500 MG tablet Take 1 tablet (500 mg total) by mouth every 8 (eight) hours as needed for muscle spasms. 20 tablet 0  . nystatin (MYCOSTATIN/NYSTOP) powder Apply topically 2 (two) times daily. 60 g 0  . ondansetron (ZOFRAN ODT) 4 MG disintegrating tablet Take 1 tablet (4 mg total) by mouth every 8 (eight) hours as needed for nausea or vomiting. 20 tablet 0  . potassium chloride SA (K-DUR,KLOR-CON) 20 MEQ tablet Take 1 tablet (20 mEq total) by mouth daily. 30 tablet 5  . promethazine (PHENADOZ) 25 MG suppository INSERT ONE SUPPOSITORY RECTALLY AS NEEDED 12 suppository 0  . rosuvastatin (CRESTOR) 40 MG tablet Take 1 tablet  (40 mg total) by mouth every other day. 15 tablet 1  . SUMAtriptan (IMITREX) 50 MG tablet May repeat in 2 hours if headache not resolved. Max 2 tabs/24 hrs 10 tablet 2  . traZODone (DESYREL) 50 MG tablet Take 0.5-1 tablets (25-50 mg total) by mouth at bedtime as needed for sleep. 30 tablet 3  . [DISCONTINUED] atorvastatin (LIPITOR) 40 MG tablet Take 1 tablet (40 mg total) by mouth daily. 30 tablet 3  . [DISCONTINUED] famotidine (PEPCID) 20 MG tablet Take 1 tablet (20 mg total) by mouth 2 (two) times daily. 30 tablet 0   No current facility-administered medications on file prior to visit.     Allergies  Allergen Reactions  . Ace Inhibitors     REACTION: chronic cough  . Celebrex [Celecoxib] Other (See Comments)    "makes me bleed"  . Diovan [Valsartan]     angioedema    Family History  Problem Relation Age of Onset  . Hypertension Mother   . Rheum arthritis Mother   . Hypertension Father   . Diabetes Father   . Prostate cancer Father   . Kidney disease Father   . Diabetes Sister   . Fibromyalgia Sister   . Diabetes Maternal Grandmother   . Lung cancer Maternal Grandfather   . Colon cancer Neg Hx   . Thyroid disease Neg Hx     BP 124/72 (BP Location: Left Arm, Patient Position: Sitting, Cuff Size: Normal)   Pulse 63   Wt 227 lb (103 kg)   SpO2 97%   BMI 38.96 kg/m   Review of Systems Denies fever.     Objective:   Physical Exam VITAL SIGNS:  See vs page GENERAL: no distress. NECK: There is no palpable thyroid enlargement.  No thyroid nodule is palpable.  No palpable lymphadenopathy at the anterior neck.     Assessment & Plan:  Hyperthyroidism, due for recheck.   Patient Instructions  blood tests are requested for you today.  We'll let you know about the results.   If ever you have fever while taking methimazole, stop it and call us, even if the reason is obvious, because of the risk of a rare side-effect. Please come back for a follow-up appointment in 3  months.

## 2017-07-08 NOTE — Telephone Encounter (Signed)
Copied from [place]. Topic: General - Other >> Jul 08, 2017  9:58 AM Neva Seat wrote: Pt returned missed call.  Please call her back.

## 2017-07-08 NOTE — Patient Instructions (Addendum)
blood tests are requested for you today.  We'll let you know about the results. If ever you have fever while taking methimazole, stop it and call us, even if the reason is obvious, because of the risk of a rare side-effect.  Please come back for a follow-up appointment in 3 months.   

## 2017-07-08 NOTE — Telephone Encounter (Signed)
I do not see that we have tried to reach pt. Notified pt nothing needed from [place] at this time.

## 2017-07-09 ENCOUNTER — Ambulatory Visit: Payer: Medicare PPO | Admitting: Physical Therapy

## 2017-07-09 ENCOUNTER — Encounter: Payer: Self-pay | Admitting: Physical Therapy

## 2017-07-09 ENCOUNTER — Telehealth: Payer: Self-pay | Admitting: Endocrinology

## 2017-07-09 ENCOUNTER — Other Ambulatory Visit: Payer: Self-pay | Admitting: Family

## 2017-07-09 DIAGNOSIS — M545 Low back pain, unspecified: Secondary | ICD-10-CM

## 2017-07-09 DIAGNOSIS — R928 Other abnormal and inconclusive findings on diagnostic imaging of breast: Secondary | ICD-10-CM

## 2017-07-09 DIAGNOSIS — M62830 Muscle spasm of back: Secondary | ICD-10-CM

## 2017-07-09 DIAGNOSIS — G8929 Other chronic pain: Secondary | ICD-10-CM

## 2017-07-09 DIAGNOSIS — M6281 Muscle weakness (generalized): Secondary | ICD-10-CM

## 2017-07-09 NOTE — Telephone Encounter (Signed)
Patient is returning your call.  

## 2017-07-09 NOTE — Therapy (Signed)
[facility] [address]  [place], [place], [address] Phone: [phone]   Fax:  [phone]  Physical Therapy Treatment  Patient Details  Name: [name] MRN: [id number] Date of Birth: May 05, 1961 Referring Provider: [name], NP   Encounter Date: 07/09/2017  PT End of Session - 07/09/17 0935    Visit Number  3    Number of Visits  12    Date for PT Re-Evaluation  08/16/17    Authorization Type  UHC Medicare    PT Start Time  0935    PT Stop Time  1034    PT Time Calculation (min)  59 min    Activity Tolerance  Patient tolerated treatment well    Behavior During Therapy  [facility] for tasks assessed/performed       Past Medical History:  Diagnosis Date  . Allergy    allergic rhinitis  . Atypical chest pain   . Constipation   . Cyst, ovarian   . Diabetes mellitus, type II ([place])   . Endometriosis   . Esophageal stricture   . Gastroparesis   . GERD (gastroesophageal reflux disease)   . Hyperlipidemia   . Hypertension   . 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 Iron deficiency anemia   . Migraine headache   . Non-compliance   . Peptic ulcer disease     Past Surgical History:  Procedure Laterality Date  . ABDOMINAL EXPLORATION SURGERY     w/bso   . CARDIAC CATHETERIZATION  2009   mild non obstructive CAD  . CHOLECYSTECTOMY    . KNEE SURGERY  2005    left knee  . TOTAL ABDOMINAL HYSTERECTOMY      There were no vitals filed for this visit.  Subjective Assessment - 07/09/17 0940    Subjective  Pt reporting she is having a bad day - back is really hurting. Not sure of trigger, but started Sat pm while sitting at a [employer].    Diagnostic tests  No recent imaging; Lumbar MRI 10/18/10: No likely significant finding.  Mild desiccation and bulging of the discs at L4-5 and L5-S1.  No stenosis of the canal or foramina.    Patient  Stated Goals  "get a whole lot better"    Currently in Pain?  Yes    Pain Score  8     Pain Location  Back    Pain Orientation  Lower    Pain Descriptors / Indicators  Stabbing    Pain Type  Acute pain;Chronic pain    Pain Onset  More than a month ago    Pain Frequency  Constant                      [facility] Adult PT Treatment/Exercise - 07/09/17 0935      Exercises   Exercises  Lumbar      Lumbar Exercises: Stretches   Passive Hamstring Stretch  Right;30 seconds;1 rep    Passive Hamstring Stretch Limitations  supine with strap    Single Knee to Chest Stretch  Right;30 seconds;1 rep    Piriformis Stretch  Right;30 seconds;1 rep each    Piriformis Stretch Limitations  figure 4 single leg & KTOS      Lumbar Exercises: Aerobic   Nustep  L4 x 7'      Lumbar Exercises: Supine   Clam  10 reps;3 seconds  Clam Limitations  TrA + alt hip ABD/ER    Bent Knee Raise  10 reps;3 seconds 2 sets    Bent Knee Raise Limitations  brace marching, 2nd set with red TB at knees; cues to slow pace and avoid "jerky" motions    Bridge with clamshell  10 reps;5 seconds    Bridge with [employer] Limitations  + hip ABD isometric with red TB      Lumbar Exercises: Sidelying   Clam  Both;10 reps;3 seconds    Clam Limitations  red TB      Modalities   Modalities  Electrical Stimulation;Moist Heat      Moist Heat Therapy   Number Minutes Moist Heat  15 Minutes    Moist Heat Location  Lumbar Spine      Electrical Stimulation   Electrical Stimulation Location  lumbar parapsinals & R glutes/piriformis    Electrical Stimulation Action  IFC    Electrical Stimulation Parameters  to pt tolerance x 15'    Electrical Stimulation Goals  Pain;Tone      Manual Therapy   Manual Therapy  Soft tissue mobilization;Myofascial release    Manual therapy comments  L sidelying    Soft tissue mobilization  R glutes, piriformis, ITB & lateral HS    Myofascial Release  TPR R piriformis, glute medius &  proximal lat HS               PT Short Term Goals - 07/04/17 6195      PT SHORT TERM GOAL #1   Title  Independent with initial HEP    Status  On-going        PT Long Term Goals - 07/04/17 0932      PT LONG TERM GOAL #1   Title  Independent with ongoing HEP +/- gym program    Status  On-going      PT LONG TERM GOAL #2   Title  Pt will verbalize/demonstrate understanding of neutral spine posture and proper body mechanics for typical daily tasks to reduce limbar strain    Status  On-going      PT LONG TERM GOAL #3   Title  B LE strength >/= 4/5 for improved function    Status  On-going      PT LONG TERM GOAL #4   Title  Pt will report at least 50% improvement in LBP to allow for decreased pain interference with daily tasks    Status  On-going            Plan - 07/09/17 0943    Clinical Impression Statement  Pt arriving to PT today reporting flare-up of LBP w/o known triggering event. Increased muscle tension observed t/o R glutes, piriformis & proximal lateral HS which responded well to STM/MFR and progression of lumbopelvic stabilization & strengthening, with pain reports down to 5/10 following this. Treatment concluded with estim & moist heat to lumbar paraspinals and R glutes/piriformis to promtoe further muscle relaxation & pain relief, with pt reporting pain "much better" by end of visit.    Rehab Potential  Good    PT Treatment/Interventions  Patient/family education;ADLs/Self Care Home Management;Neuromuscular re-education;Therapeutic exercise;Therapeutic activities;Functional mobility training;Balance training;Manual techniques;Dry needling;Taping;Electrical Stimulation;Moist Heat;Cryotherapy;Ultrasound;Iontophoresis 4mg /ml Dexamethasone;Traction    Consulted and Agree with Plan of Care  Patient       Patient will benefit from skilled therapeutic intervention in order to improve the following deficits and impairments:  Pain, Impaired flexibility, Increased  muscle spasms, Decreased range of motion,  Decreased strength, Postural dysfunction, Improper body mechanics, Decreased mobility, Decreased activity tolerance  Visit Diagnosis: Chronic right-sided low back pain without sciatica  Muscle spasm of back  Muscle weakness (generalized)     Problem List Patient Active Problem List   Diagnosis Date Noted  . Insomnia 01/12/2017  . Hyperglycemia 10/07/2014  . HTN (hypertension) 09/18/2012  . Breast cyst 06/09/2012  . Hyperthyroidism 02/29/2012  . Unspecified constipation 04/18/2011  . Can't get food down 04/18/2011  . Ulnar neuropathy 03/30/2011  . Low back pain 10/18/2010  . FIBROMYALGIA 07/20/2009  . Hyperlipidemia 05/26/2009  . VITAMIN D DEFICIENCY 05/23/2009  . Iron deficiency anemia due to chronic blood loss 10/25/2008  . GERD 10/25/2008  . LIVER HEMANGIOMA 01/01/2008  . ESOPHAGEAL STRICTURE 01/01/2008  . GASTROPARESIS 01/01/2008  . ALLERGIC RHINITIS 12/08/2007  . Borderline type 2 diabetes mellitus 10/31/2007  . Migraine headache 08/11/2007  . HIATAL HERNIA 08/11/2007  . OVARIAN CYST 08/11/2007  . Essential hypertension, benign 05/06/2007    Percival Spanish, PT, MPT 07/09/2017, 12:25 PM  [facility] [address]  [place], [place], [address] Phone: [phone]   Fax:  [phone]  Name: [name] MRN: [id number] Date of Birth: Mar 26, 1961

## 2017-07-09 NOTE — Telephone Encounter (Signed)
I returned patient's call & let her know labs were normal.

## 2017-07-10 ENCOUNTER — Telehealth: Payer: Self-pay | Admitting: *Deleted

## 2017-07-10 NOTE — Telephone Encounter (Signed)
Received Physician Orders from [facility]; forwarded to provider/SLS 01/16

## 2017-07-11 ENCOUNTER — Other Ambulatory Visit: Payer: Self-pay | Admitting: Family

## 2017-07-11 ENCOUNTER — Ambulatory Visit: Payer: Medicare PPO

## 2017-07-11 DIAGNOSIS — M62830 Muscle spasm of back: Secondary | ICD-10-CM

## 2017-07-11 DIAGNOSIS — M545 Low back pain, unspecified: Secondary | ICD-10-CM

## 2017-07-11 DIAGNOSIS — M6281 Muscle weakness (generalized): Secondary | ICD-10-CM

## 2017-07-11 DIAGNOSIS — G8929 Other chronic pain: Secondary | ICD-10-CM

## 2017-07-11 NOTE — Therapy (Signed)
[facility] [address]  [place], [place], [address] Phone: [phone]   Fax:  [phone]  Physical Therapy Treatment  Patient Details  Name: [name] MRN: [id number] Date of Birth: [birth year] Referring Provider: [name], NP   Encounter Date: 07/11/2017  PT End of Session - 07/11/17 0905    Visit Number  4    Number of Visits  12    Date for PT Re-Evaluation  08/16/17    Authorization Type  [employer] Medicare    PT Start Time  0900 pt. arrived late     PT Stop Time  0944    PT Time Calculation (min)  44 min    Activity Tolerance  Patient tolerated treatment well    Behavior During Therapy  [facility] for tasks assessed/performed       Past Medical History:  Diagnosis Date  . Allergy    allergic rhinitis  . Atypical chest pain   . Constipation   . Cyst, ovarian   . Diabetes mellitus, type II ([place])   . Endometriosis   . Esophageal stricture   . Gastroparesis   . GERD (gastroesophageal reflux disease)   . Hyperlipidemia   . Hypertension   . 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 Iron deficiency anemia   . Migraine headache   . Non-compliance   . Peptic ulcer disease     Past Surgical History:  Procedure Laterality Date  . ABDOMINAL EXPLORATION SURGERY     w/bso   . CARDIAC CATHETERIZATION  2009   mild non obstructive CAD  . CHOLECYSTECTOMY    . KNEE SURGERY  2005    left knee  . TOTAL ABDOMINAL HYSTERECTOMY      There were no vitals filed for this visit.  Subjective Assessment - 07/11/17 0902    Subjective  Reports improvement in LBP following last visit.      Diagnostic tests  No recent imaging; Lumbar MRI 10/18/10: No likely significant finding.  Mild desiccation and bulging of the discs at L4-5 and L5-S1.  No stenosis of the canal or foramina.    Patient Stated Goals  "get a whole lot better"    Currently in Pain?  Yes     Pain Score  4     Pain Orientation  Lower;Right    Pain Descriptors / Indicators  Stabbing    Pain Type  Acute pain;Chronic pain    Pain Onset  More than a month ago    Pain Frequency  Constant    Aggravating Factors   Prolonged sitting     Pain Relieving Factors  Heat     Multiple Pain Sites  No                      [facility] Adult PT Treatment/Exercise - 07/11/17 0912      Self-Care   Self-Care  Other Self-Care Comments    Other Self-Care Comments   demo of self-tennis ball release on wall for lumbar paraspinals and glute/piriformis area which remain tender      Lumbar Exercises: Stretches   Piriformis Stretch  Right;30 seconds;1 rep    Piriformis Stretch Limitations  Figure-4 position       Lumbar Exercises: Aerobic   Nustep  L6 x 4'      Lumbar Exercises: Supine   Bridge  5 seconds;15 reps admitting to inconsistent performance at home  Bridge Limitations  + hip add pillow squeeze      Lumbar Exercises: Sidelying   Other Sidelying Lumbar Exercises  B sidelying adduction x 10 reps  difficulty       Knee/Hip Exercises: Standing   Hip Flexion  Right;Left;Knee bent;10 reps    Hip Flexion Limitations  at counter; cues to activate abdominals     Hip Abduction  Right;Left;10 reps;Knee straight    Abduction Limitations  at counter; cues to activate abdominals     Hip Extension  Right;Left;10 reps;Knee straight    Extension Limitations  at counter; cues for abdominal activation       Modalities   Modalities  Electrical Stimulation;Moist Heat      Moist Heat Therapy   Number Minutes Moist Heat  15 Minutes    Moist Heat Location  Lumbar Spine      Electrical Stimulation   Electrical Stimulation Location  lumbar parapsinals & R glutes/piriformis    Electrical Stimulation Action  IFC    Electrical Stimulation Parameters  to tolerance, 15'     Electrical Stimulation Goals  Pain;Tone      Manual Therapy   Manual Therapy  Soft tissue mobilization;Myofascial  release    Manual therapy comments  L sidelying    Soft tissue mobilization  STM to R glute and R-sided lumbar paraspinals     Myofascial Release  TPR R piriformis/glute             PT Education - 07/11/17 1108    Education provided  Yes    Education Details  3-way hip kicker at counter, glute self-ball release on wall with tennis ball     Person(s) Educated  Patient    Methods  Explanation;Demonstration;Verbal cues;Handout    Comprehension  Verbalized understanding;Returned demonstration;Verbal cues required;Need further instruction       PT Short Term Goals - 07/11/17 1119      PT SHORT TERM GOAL #1   Title  Independent with initial HEP    Status  Achieved        PT Long Term Goals - 07/04/17 3762      PT LONG TERM GOAL #1   Title  Independent with ongoing HEP +/- gym program    Status  On-going      PT LONG TERM GOAL #2   Title  Pt will verbalize/demonstrate understanding of neutral spine posture and proper body mechanics for typical daily tasks to reduce limbar strain    Status  On-going      PT LONG TERM GOAL #3   Title  B LE strength >/= 4/5 for improved function    Status  On-going      PT LONG TERM GOAL #4   Title  Pt will report at least 50% improvement in LBP to allow for decreased pain interference with daily tasks    Status  On-going            Plan - 07/11/17 1109    Clinical Impression Statement  Pt. arrived late to treatment today thus session time limited.  Codi reporting good relief from LBP following "massage and electricity" performed last visit.  Progressed with lumbopelvic strengthening activities today with pt. tolerating well.  Still requiring some cueing for abdominal activation with therex.  Still with some tenderness in R glute today thus reviewed self-tennis ball release on wall for home use.  Feels she is making progress with LBP with therapy.  Will monitor tolerance to updated HEP and progress  as pt. able in coming visits.       PT Treatment/Interventions  Patient/family education;ADLs/Self Care Home Management;Neuromuscular re-education;Therapeutic exercise;Therapeutic activities;Functional mobility training;Balance training;Manual techniques;Dry needling;Taping;Electrical Stimulation;Moist Heat;Cryotherapy;Ultrasound;Iontophoresis 4mg /ml Dexamethasone;Traction    Consulted and Agree with Plan of Care  Patient       Patient will benefit from skilled therapeutic intervention in order to improve the following deficits and impairments:  Pain, Impaired flexibility, Increased muscle spasms, Decreased range of motion, Decreased strength, Postural dysfunction, Improper body mechanics, Decreased mobility, Decreased activity tolerance  Visit Diagnosis: Chronic right-sided low back pain without sciatica  Muscle spasm of back  Muscle weakness (generalized)     Problem List Patient Active Problem List   Diagnosis Date Noted  . Insomnia 01/12/2017  . Hyperglycemia 10/07/2014  . HTN (hypertension) 09/18/2012  . Breast cyst 06/09/2012  . Hyperthyroidism 02/29/2012  . Unspecified constipation 04/18/2011  . Can't get food down 04/18/2011  . Ulnar neuropathy 03/30/2011  . Low back pain 10/18/2010  . FIBROMYALGIA 07/20/2009  . Hyperlipidemia 05/26/2009  . VITAMIN D DEFICIENCY 05/23/2009  . Iron deficiency anemia due to chronic blood loss 10/25/2008  . GERD 10/25/2008  . LIVER HEMANGIOMA 01/01/2008  . ESOPHAGEAL STRICTURE 01/01/2008  . GASTROPARESIS 01/01/2008  . ALLERGIC RHINITIS 12/08/2007  . Borderline type 2 diabetes mellitus 10/31/2007  . Migraine headache 08/11/2007  . HIATAL HERNIA 08/11/2007  . OVARIAN CYST 08/11/2007  . Essential hypertension, benign 05/06/2007    Bess Harvest, PTA 07/11/17 11:23 AM  [facility] [address]  [place], [place], [address] Phone: [phone]   Fax:  [phone]  Name: SIRINITY OUTLAND MRN:  [id number] Date of Birth: [birth year]

## 2017-07-12 ENCOUNTER — Ambulatory Visit
Admission: RE | Admit: 2017-07-12 | Discharge: 2017-07-12 | Disposition: A | Discharge status: Home / Self Care | Payer: BC Managed Care – PPO | Source: Ambulatory Visit | Attending: Family | Admitting: Family

## 2017-07-12 ENCOUNTER — Other Ambulatory Visit: Payer: Self-pay | Admitting: Family

## 2017-07-12 ENCOUNTER — Inpatient Hospital Stay: Admission: RE | Admit: 2017-07-12 | Payer: BC Managed Care – PPO | Source: Ambulatory Visit

## 2017-07-12 DIAGNOSIS — R928 Other abnormal and inconclusive findings on diagnostic imaging of breast: Secondary | ICD-10-CM

## 2017-07-12 DIAGNOSIS — N6489 Other specified disorders of breast: Secondary | ICD-10-CM

## 2017-07-15 ENCOUNTER — Encounter: Payer: Self-pay | Admitting: Physical Therapy

## 2017-07-15 ENCOUNTER — Ambulatory Visit: Payer: Medicare PPO | Admitting: Physical Therapy

## 2017-07-15 DIAGNOSIS — M545 Low back pain, unspecified: Secondary | ICD-10-CM

## 2017-07-15 DIAGNOSIS — G8929 Other chronic pain: Secondary | ICD-10-CM

## 2017-07-15 DIAGNOSIS — M6281 Muscle weakness (generalized): Secondary | ICD-10-CM

## 2017-07-15 DIAGNOSIS — M62830 Muscle spasm of back: Secondary | ICD-10-CM

## 2017-07-15 NOTE — Therapy (Signed)
[facility] [address]  [place], [place], [address] Phone: [phone]   Fax:  [phone]  Physical Therapy Treatment  Patient Details  Name: [name] MRN: [id number] Date of Birth: Jan 05, 1961 Referring Provider: [name], NP   Encounter Date: 07/15/2017  PT End of Session - 07/15/17 0929    Visit Number  5    Number of Visits  12    Date for PT Re-Evaluation  08/16/17    Authorization Type  [employer] Medicare    PT Start Time  0929    PT Stop Time  1011    PT Time Calculation (min)  42 min    Activity Tolerance  Patient tolerated treatment well    Behavior During Therapy  [facility] for tasks assessed/performed       Past Medical History:  Diagnosis Date  . Allergy    allergic rhinitis  . Atypical chest pain   . Constipation   . Cyst, ovarian   . Diabetes mellitus, type II ([place])   . Endometriosis   . Esophageal stricture   . Gastroparesis   . GERD (gastroesophageal reflux disease)   . Hyperlipidemia   . Hypertension   . 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 Iron deficiency anemia   . Migraine headache   . Non-compliance   . Peptic ulcer disease     Past Surgical History:  Procedure Laterality Date  . ABDOMINAL EXPLORATION SURGERY     w/bso   . CARDIAC CATHETERIZATION  2009   mild non obstructive CAD  . CHOLECYSTECTOMY    . KNEE SURGERY  2005    left knee  . TOTAL ABDOMINAL HYSTERECTOMY      There were no vitals filed for this visit.  Subjective Assessment - 07/15/17 0934    Subjective  "Doing pretty good today."    Diagnostic tests  No recent imaging; Lumbar MRI 10/18/10: No likely significant finding.  Mild desiccation and bulging of the discs at L4-5 and L5-S1.  No stenosis of the canal or foramina.    Patient Stated Goals  "get a whole lot better"    Currently in Pain?  No/denies    Pain Onset  More than a month ago                       [facility] Adult PT Treatment/Exercise - 07/15/17 0929      Exercises   Exercises  Lumbar      Lumbar Exercises: Aerobic   Nustep  L6 x 7'      Lumbar Exercises: Standing   Heel Raises  10 reps;3 seconds 2 sets    Functional Squats  10 reps;3 seconds    Functional Squats Limitations  counter squat    Row  Both;15 reps;Theraband;Strengthening    Theraband Level (Row)  Level 2 (Red)    Row Limitations  staggered stance with cues for abd bracing & scap retraction    Shoulder Extension  Both;15 reps;Theraband;Strengthening    Theraband Level (Shoulder Extension)  Level 2 (Red)    Shoulder Extension Limitations  staggered stance with cues for abd bracing & scap retraction    Other Standing Lumbar Exercises  B pallof press with red TB x15      Lumbar Exercises: Seated   Long Arc Quad on [place]  Both;10 reps    LAQ on [place] Limitations  green Pball  Hip Flexion on Ball  Both;10 reps 2 sets    Hip Flexion on Ball Limitations  green Pball; 2nd set with opp shoulder flexion      Lumbar Exercises: Quadruped   Single Arm Raise  Right;Left;10 reps;3 seconds    Single Arm Raises Limitations  torso supported on peanut ball     Straight Leg Raise  10 reps;3 seconds    Straight Leg Raises Limitations  torso supported on peanut ball; cues to trunk rotation      Knee/Hip Exercises: Standing   Hip Flexion  Both;10 reps;Knee straight;Stengthening    Hip Flexion Limitations  red TB; cues for abd bracing    Hip ADduction  Both;10 reps;Strengthening    Hip ADduction Limitations  red TB; cues for abd bracing    Hip Abduction  Both;10 reps;Knee straight;Stengthening    Abduction Limitations  red TB; cues for abd bracing    Hip Extension  Both;10 reps;Knee straight;Stengthening    Extension Limitations  red TB; cues for abd bracing               PT Short Term Goals - 07/11/17 1119      PT SHORT TERM GOAL #1   Title  Independent with initial HEP    Status   Achieved        PT Long Term Goals - 07/04/17 0254      PT LONG TERM GOAL #1   Title  Independent with ongoing HEP +/- gym program    Status  On-going      PT LONG TERM GOAL #2   Title  Pt will verbalize/demonstrate understanding of neutral spine posture and proper body mechanics for typical daily tasks to reduce limbar strain    Status  On-going      PT LONG TERM GOAL #3   Title  B LE strength >/= 4/5 for improved function    Status  On-going      PT LONG TERM GOAL #4   Title  Pt will report at least 50% improvement in LBP to allow for decreased pain interference with daily tasks    Status  On-going            Plan - 07/15/17 0935    Clinical Impression Statement  Pt denying any pain today. Good tolerance for exercise progression, including introduction of standing and quadruped exercises. Moderate cueing necessary to maintain good posture and for abdominal bracing during some exercises. Pt completing visit remaining painfree.    Rehab Potential  Good    PT Treatment/Interventions  Patient/family education;ADLs/Self Care Home Management;Neuromuscular re-education;Therapeutic exercise;Therapeutic activities;Functional mobility training;Balance training;Manual techniques;Dry needling;Taping;Electrical Stimulation;Moist Heat;Cryotherapy;Ultrasound;Iontophoresis 4mg /ml Dexamethasone;Traction    Consulted and Agree with Plan of Care  Patient       Patient will benefit from skilled therapeutic intervention in order to improve the following deficits and impairments:  Pain, Impaired flexibility, Increased muscle spasms, Decreased range of motion, Decreased strength, Postural dysfunction, Improper body mechanics, Decreased mobility, Decreased activity tolerance  Visit Diagnosis: Chronic right-sided low back pain without sciatica  Muscle spasm of back  Muscle weakness (generalized)     Problem List Patient Active Problem List   Diagnosis Date Noted  . Insomnia 01/12/2017   . Hyperglycemia 10/07/2014  . HTN (hypertension) 09/18/2012  . Breast cyst 06/09/2012  . Hyperthyroidism 02/29/2012  . Unspecified constipation 04/18/2011  . Can't get food down 04/18/2011  . Ulnar neuropathy 03/30/2011  . Low back pain 10/18/2010  . FIBROMYALGIA 07/20/2009  .  Hyperlipidemia 05/26/2009  . VITAMIN D DEFICIENCY 05/23/2009  . Iron deficiency anemia due to chronic blood loss 10/25/2008  . GERD 10/25/2008  . LIVER HEMANGIOMA 01/01/2008  . ESOPHAGEAL STRICTURE 01/01/2008  . GASTROPARESIS 01/01/2008  . ALLERGIC RHINITIS 12/08/2007  . Borderline type 2 diabetes mellitus 10/31/2007  . Migraine headache 08/11/2007  . HIATAL HERNIA 08/11/2007  . OVARIAN CYST 08/11/2007  . Essential hypertension, benign 05/06/2007    Percival Spanish, PT, MPT 07/15/2017, 10:21 AM  [facility] [address]  [place], [place], [address] Phone: [phone]   Fax:  [phone]  Name: [name] MRN: [id number] Date of Birth: 03-06-1961

## 2017-07-16 ENCOUNTER — Emergency Department (HOSPITAL_COMMUNITY): Payer: Medicare PPO

## 2017-07-16 ENCOUNTER — Encounter: Payer: Self-pay | Admitting: Gastroenterology

## 2017-07-16 ENCOUNTER — Other Ambulatory Visit: Payer: Self-pay

## 2017-07-16 ENCOUNTER — Observation Stay (HOSPITAL_COMMUNITY)
Admission: EM | Admit: 2017-07-16 | Discharge: 2017-07-18 | Disposition: A | Discharge status: Home / Self Care | Payer: Medicare PPO | Attending: Family Medicine | Admitting: Family Medicine

## 2017-07-16 ENCOUNTER — Other Ambulatory Visit: Payer: Self-pay | Admitting: Gastroenterology

## 2017-07-16 ENCOUNTER — Ambulatory Visit (INDEPENDENT_AMBULATORY_CARE_PROVIDER_SITE_OTHER)
Admission: RE | Admit: 2017-07-16 | Discharge: 2017-07-16 | Disposition: A | Discharge status: Home / Self Care | Payer: Medicare PPO | Source: Ambulatory Visit | Attending: Gastroenterology | Admitting: Gastroenterology

## 2017-07-16 ENCOUNTER — Encounter (HOSPITAL_COMMUNITY): Payer: Self-pay | Admitting: *Deleted

## 2017-07-16 ENCOUNTER — Ambulatory Visit (AMBULATORY_SURGERY_CENTER): Payer: Medicare PPO | Admitting: Gastroenterology

## 2017-07-16 VITALS — BP 157/100 | HR 94 | Temp 97.8°F | Resp 14 | Ht 64.0 in | Wt 227.0 lb

## 2017-07-16 DIAGNOSIS — Z886 Allergy status to analgesic agent status: Secondary | ICD-10-CM | POA: Insufficient documentation

## 2017-07-16 DIAGNOSIS — I2510 Atherosclerotic heart disease of native coronary artery without angina pectoris: Secondary | ICD-10-CM | POA: Diagnosis not present

## 2017-07-16 DIAGNOSIS — K3184 Gastroparesis: Secondary | ICD-10-CM | POA: Insufficient documentation

## 2017-07-16 DIAGNOSIS — R079 Chest pain, unspecified: Secondary | ICD-10-CM | POA: Diagnosis not present

## 2017-07-16 DIAGNOSIS — D500 Iron deficiency anemia secondary to blood loss (chronic): Secondary | ICD-10-CM | POA: Insufficient documentation

## 2017-07-16 DIAGNOSIS — E785 Hyperlipidemia, unspecified: Secondary | ICD-10-CM | POA: Diagnosis present

## 2017-07-16 DIAGNOSIS — Z79899 Other long term (current) drug therapy: Secondary | ICD-10-CM | POA: Insufficient documentation

## 2017-07-16 DIAGNOSIS — K219 Gastro-esophageal reflux disease without esophagitis: Secondary | ICD-10-CM | POA: Insufficient documentation

## 2017-07-16 DIAGNOSIS — G5620 Lesion of ulnar nerve, unspecified upper limb: Secondary | ICD-10-CM | POA: Insufficient documentation

## 2017-07-16 DIAGNOSIS — G43909 Migraine, unspecified, not intractable, without status migrainosus: Secondary | ICD-10-CM | POA: Insufficient documentation

## 2017-07-16 DIAGNOSIS — I700 Atherosclerosis of aorta: Secondary | ICD-10-CM

## 2017-07-16 DIAGNOSIS — Z90710 Acquired absence of both cervix and uterus: Secondary | ICD-10-CM | POA: Diagnosis not present

## 2017-07-16 DIAGNOSIS — Z9049 Acquired absence of other specified parts of digestive tract: Secondary | ICD-10-CM | POA: Diagnosis not present

## 2017-07-16 DIAGNOSIS — Z888 Allergy status to other drugs, medicaments and biological substances status: Secondary | ICD-10-CM | POA: Diagnosis not present

## 2017-07-16 DIAGNOSIS — Z8711 Personal history of peptic ulcer disease: Secondary | ICD-10-CM | POA: Insufficient documentation

## 2017-07-16 DIAGNOSIS — K5900 Constipation, unspecified: Secondary | ICD-10-CM | POA: Diagnosis not present

## 2017-07-16 DIAGNOSIS — R7989 Other specified abnormal findings of blood chemistry: Secondary | ICD-10-CM

## 2017-07-16 DIAGNOSIS — I10 Essential (primary) hypertension: Secondary | ICD-10-CM | POA: Diagnosis not present

## 2017-07-16 DIAGNOSIS — K222 Esophageal obstruction: Secondary | ICD-10-CM

## 2017-07-16 DIAGNOSIS — R748 Abnormal levels of other serum enzymes: Secondary | ICD-10-CM | POA: Diagnosis present

## 2017-07-16 DIAGNOSIS — R1310 Dysphagia, unspecified: Secondary | ICD-10-CM

## 2017-07-16 DIAGNOSIS — K317 Polyp of stomach and duodenum: Secondary | ICD-10-CM | POA: Diagnosis not present

## 2017-07-16 DIAGNOSIS — Z833 Family history of diabetes mellitus: Secondary | ICD-10-CM | POA: Insufficient documentation

## 2017-07-16 DIAGNOSIS — K2980 Duodenitis without bleeding: Secondary | ICD-10-CM | POA: Diagnosis not present

## 2017-07-16 DIAGNOSIS — I4581 Long QT syndrome: Secondary | ICD-10-CM | POA: Diagnosis not present

## 2017-07-16 DIAGNOSIS — Z8269 Family history of other diseases of the musculoskeletal system and connective tissue: Secondary | ICD-10-CM | POA: Insufficient documentation

## 2017-07-16 DIAGNOSIS — Z791 Long term (current) use of non-steroidal anti-inflammatories (NSAID): Secondary | ICD-10-CM | POA: Diagnosis not present

## 2017-07-16 DIAGNOSIS — K649 Unspecified hemorrhoids: Secondary | ICD-10-CM

## 2017-07-16 DIAGNOSIS — Z8042 Family history of malignant neoplasm of prostate: Secondary | ICD-10-CM | POA: Insufficient documentation

## 2017-07-16 DIAGNOSIS — E0590 Thyrotoxicosis, unspecified without thyrotoxic crisis or storm: Secondary | ICD-10-CM | POA: Insufficient documentation

## 2017-07-16 DIAGNOSIS — Z841 Family history of disorders of kidney and ureter: Secondary | ICD-10-CM | POA: Insufficient documentation

## 2017-07-16 DIAGNOSIS — R072 Precordial pain: Secondary | ICD-10-CM | POA: Diagnosis present

## 2017-07-16 DIAGNOSIS — Z8249 Family history of ischemic heart disease and other diseases of the circulatory system: Secondary | ICD-10-CM | POA: Insufficient documentation

## 2017-07-16 DIAGNOSIS — K921 Melena: Secondary | ICD-10-CM

## 2017-07-16 DIAGNOSIS — R778 Other specified abnormalities of plasma proteins: Secondary | ICD-10-CM

## 2017-07-16 DIAGNOSIS — Z87891 Personal history of nicotine dependence: Secondary | ICD-10-CM | POA: Insufficient documentation

## 2017-07-16 DIAGNOSIS — Z6838 Body mass index (BMI) 38.0-38.9, adult: Secondary | ICD-10-CM | POA: Insufficient documentation

## 2017-07-16 DIAGNOSIS — Z8261 Family history of arthritis: Secondary | ICD-10-CM | POA: Insufficient documentation

## 2017-07-16 DIAGNOSIS — D509 Iron deficiency anemia, unspecified: Secondary | ICD-10-CM | POA: Diagnosis present

## 2017-07-16 DIAGNOSIS — Z9889 Other specified postprocedural states: Secondary | ICD-10-CM | POA: Insufficient documentation

## 2017-07-16 DIAGNOSIS — K625 Hemorrhage of anus and rectum: Secondary | ICD-10-CM

## 2017-07-16 DIAGNOSIS — Z801 Family history of malignant neoplasm of trachea, bronchus and lung: Secondary | ICD-10-CM | POA: Insufficient documentation

## 2017-07-16 DIAGNOSIS — M797 Fibromyalgia: Secondary | ICD-10-CM | POA: Insufficient documentation

## 2017-07-16 LAB — I-STAT TROPONIN, ED
Troponin i, poc: 0.11 ng/mL (ref 0.00–0.08)
Troponin i, poc: 0.31 ng/mL (ref 0.00–0.08)

## 2017-07-16 LAB — COMPREHENSIVE METABOLIC PANEL
ALT: 26 U/L (ref 14–54)
AST: 28 U/L (ref 15–41)
Albumin: 3.8 g/dL (ref 3.5–5.0)
Alkaline Phosphatase: 125 U/L (ref 38–126)
Anion gap: 8 (ref 5–15)
BUN: 10 mg/dL (ref 6–20)
CO2: 24 mmol/L (ref 22–32)
Calcium: 8.8 mg/dL — ABNORMAL LOW (ref 8.9–10.3)
Chloride: 108 mmol/L (ref 101–111)
Creatinine, Ser: 0.73 mg/dL (ref 0.44–1.00)
GFR calc Af Amer: >60 mL/min (ref >60)
GFR calc non Af Amer: >60 mL/min (ref >60)
Glucose, Bld: 143 mg/dL — ABNORMAL HIGH (ref 65–99)
Potassium: 2.9 mmol/L — ABNORMAL LOW (ref 3.5–5.1)
Sodium: 140 mmol/L (ref 135–145)
Total Bilirubin: 0.3 mg/dL (ref 0.3–1.2)
Total Protein: 7.9 g/dL (ref 6.5–8.1)

## 2017-07-16 LAB — CBC
HCT: 37.2 % (ref 36.0–46.0)
Hemoglobin: 11.5 g/dL — ABNORMAL LOW (ref 12.0–15.0)
MCH: 22.9 pg — ABNORMAL LOW (ref 26.0–34.0)
MCHC: 30.9 g/dL (ref 30.0–36.0)
MCV: 74.1 fL — ABNORMAL LOW (ref 78.0–100.0)
Platelets: 249 10*3/uL (ref 150–400)
RBC: 5.02 MIL/uL (ref 3.87–5.11)
RDW: 13.9 % (ref 11.5–15.5)
WBC: 10.3 10*3/uL (ref 4.0–10.5)

## 2017-07-16 LAB — LIPASE, BLOOD: Lipase: 26 U/L (ref 11–51)

## 2017-07-16 MED ORDER — ONDANSETRON HCL 4 MG PO TABS
4.0000 mg | ORAL_TABLET | Freq: Four times a day (QID) | ORAL | Status: DC | PRN
  Administered 2017-07-17: 4 mg via ORAL
  Filled 2017-07-16: qty 1

## 2017-07-16 MED ORDER — PANTOPRAZOLE SODIUM 40 MG IV SOLR
40.0000 mg | Freq: Two times a day (BID) | INTRAVENOUS | Status: DC
  Administered 2017-07-16 – 2017-07-18 (×4): 40 mg via INTRAVENOUS
  Filled 2017-07-16: qty 40

## 2017-07-16 MED ORDER — POTASSIUM CHLORIDE 10 MEQ/100ML IV SOLN
10.0000 meq | INTRAVENOUS | Status: AC
  Administered 2017-07-16 – 2017-07-17 (×2): 10 meq via INTRAVENOUS
  Filled 2017-07-16 (×2): qty 100

## 2017-07-16 MED ORDER — KCL IN DEXTROSE-NACL 20-5-0.9 MEQ/L-%-% IV SOLN
INTRAVENOUS | Status: DC
  Administered 2017-07-17: 02:00:00 via INTRAVENOUS
  Filled 2017-07-16 (×2): qty 1000

## 2017-07-16 MED ORDER — SODIUM CHLORIDE 0.9 % IV BOLUS (SEPSIS)
1000.0000 mL | Freq: Once | INTRAVENOUS | Status: AC
  Administered 2017-07-16: 1000 mL via INTRAVENOUS

## 2017-07-16 MED ORDER — MORPHINE SULFATE (PF) 4 MG/ML IV SOLN
1.0000 mg | INTRAVENOUS | Status: DC | PRN
  Administered 2017-07-17 – 2017-07-18 (×5): 1 mg via INTRAVENOUS
  Filled 2017-07-16 (×5): qty 1

## 2017-07-16 MED ORDER — ONDANSETRON HCL 4 MG PO TABS: 4.0000 mg | ORAL_TABLET | Freq: Three times a day (TID) | ORAL | 1 refills | Status: DC | PRN

## 2017-07-16 MED ORDER — MORPHINE SULFATE (PF) 4 MG/ML IV SOLN
4.0000 mg | Freq: Once | INTRAVENOUS | Status: AC
  Administered 2017-07-16: 4 mg via INTRAVENOUS
  Filled 2017-07-16: qty 1

## 2017-07-16 MED ORDER — SODIUM CHLORIDE 0.9 % IV SOLN: 500.0000 mL | Freq: Once | INTRAVENOUS | Status: DC

## 2017-07-16 MED ORDER — ACETAMINOPHEN 650 MG RE SUPP: 650.0000 mg | Freq: Four times a day (QID) | RECTAL | Status: DC | PRN

## 2017-07-16 MED ORDER — HYDRALAZINE HCL 20 MG/ML IJ SOLN: 10.0000 mg | INTRAMUSCULAR | Status: DC | PRN

## 2017-07-16 MED ORDER — SODIUM CHLORIDE 0.9 % IV SOLN
4.0000 mg | Freq: Once | INTRAVENOUS | Status: DC
  Administered 2017-07-16: 4 mg via INTRAVENOUS

## 2017-07-16 MED ORDER — ONDANSETRON HCL 4 MG/2ML IJ SOLN
4.0000 mg | Freq: Once | INTRAMUSCULAR | Status: AC
  Administered 2017-07-16: 4 mg via INTRAVENOUS
  Filled 2017-07-16: qty 2

## 2017-07-16 MED ORDER — ACETAMINOPHEN 325 MG PO TABS: 650.0000 mg | ORAL_TABLET | Freq: Four times a day (QID) | ORAL | Status: DC | PRN

## 2017-07-16 MED ORDER — ONDANSETRON HCL 4 MG/2ML IJ SOLN: 4.0000 mg | Freq: Four times a day (QID) | INTRAMUSCULAR | Status: DC | PRN

## 2017-07-16 MED ORDER — POTASSIUM CHLORIDE 2 MEQ/ML IV SOLN: INTRAVENOUS | Status: DC

## 2017-07-16 MED ORDER — ASPIRIN 81 MG PO CHEW
324.0000 mg | CHEWABLE_TABLET | Freq: Once | ORAL | Status: AC
  Administered 2017-07-16: 324 mg via ORAL
  Filled 2017-07-16: qty 4

## 2017-07-16 NOTE — ED Triage Notes (Addendum)
Transported by [facility] from [facility]--had endoscopy performed today. After procedure patient developed sudden onset of chest pain. +nausea/vomiting and diarrhea. XR performed at clinic was (-) per EMS. 8 mg of Zofran given IV PTA. Pain 10/10.

## 2017-07-16 NOTE — Progress Notes (Signed)
  GI UPDATE:  [name] is a patient of mine who underwent EGD and colonoscopy today at our office. Her endoscopy was remarkable for a mild distal esophageal stricture, which was dilated with a balloon. Following dilation, no mucosal wrents of the esophagus were noted. Other findings on her EGD were a small duodenal polyp which was biopsied, and colonoscopy showed internal hemorrhoids and diverticulosis, but no other pathology.  Unfortunately following the procedure she developed discomfort in her mid chest and vomiting. HR and BP were similar pre and post procedure. CXR did not show any free air. She continued to feel uncomfortable so we transferred her to the ER for further evaluation. Stat CT scan of the chest obtained - I reviewed this with Dr. Jasmine December of radiology - no evidence of perforation or esophageal disruption. There is some thickening / edema of the distal esophagus - but unclear chronicity of this, if reactive due to dilation or present at baseline.   She continues to feel poorly. Labs show mildly positive troponin. Her EKG is without any ischemic changes but she is going to be admitted to rule out ACS. WBC at 10, Hgb stable. Awaiting labs for metabolic panel.   At this time I'm not sure if her symptoms are due to esophageal spasm following dilation or cardiac in nature, or other, but she is being admitted to rule out MI given labs. She seems quite comfortable at this time following morphine. Her abdomen is soft, she denies any abdominal pain.  I would keep her NPO overnight given her nausea and discomfort, while we await the rest of her workup and course.   Please call me in the interim with any questions.   [place] Cellar, MD [facility] Pager [phone]

## 2017-07-16 NOTE — Progress Notes (Signed)
Patient developed chest pain and some nausea / vomiting post procedure. Her vitals have been stable - baseline HR around 90s prior to procedure and has been 90s-low 100s. She has been hypertensive post procedure. With zofran and monitoring her symptoms have not improved despite passing gas and belching. CXR done which was normal, reviewed with radiology.   Her symptoms have not improved, she continues to feel poorly. She had an EGD with dilation of distal esophageal stricture to 66mm. Balloon was used. No mucosal wrents were noted post dilation. Unclear if she is having esophageal spasm causing her symptoms but given she is not improving she will be sent to the ED for further management. I discussed her case with the ED physician, will plan CT chest upon arrival, she also needs EKG / cardiac evaluation to ensure okay. I will meet her in the ED. Discussed with family and patient who agreed with the plan.

## 2017-07-16 NOTE — Progress Notes (Signed)
Called to room to assist during endoscopic procedure.  Patient ID and intended procedure confirmed with present staff. Received instructions for my participation in the procedure from the performing physician.  

## 2017-07-16 NOTE — Progress Notes (Signed)
Patient received in recovery post upper and lower endoscopy. Anesthetist reported that patient had copious amounts of secretions during the procedure and was tachycardic with an elevated BP. Patient HR 105, BP 158/104, o2 sat 96% on RA upon arrival in recovery. Patient spitting out a large amount of clear foamy secretions, denies abdominal pain, but c/o centralized chest pain. Repositioned patient and patient passed air from below and belched as well. Patient reports no relief from chest pain after belching. Patient denies nausea. Slowing sat patient up in an attempt to help her release air from her stomach. Patient belched some, but then vomited 100cc bile colored liquid. Obtained order from Dr. Havery Moros and administered Zofran 4mg  IV diluted over 2 min. At 1610 Per protocol. Patient has no relief. Dr. Havery Moros here to see patient and orders another 4mg  of Zofran IV which I administered at 1625 without relief per patient. Patient taken to xray for CXR stat which was reportedly normal per Dr. Havery Moros. Dr. Havery Moros orders transfer to [facility] for further evaluation as patient continues to c/o CP. Patient transferred to [facility] via ambulance at 1706. Report given to ambulance crew with transfer form, copies of procedure reports. Patient's belongings given to her daughter. Discharge VS HR 96, BP 154/101, o2 sat 100% on RA. Please see recovery notes for all VS and assessments.

## 2017-07-16 NOTE — Op Note (Signed)
[facility] Patient Name: [name] Procedure Date: 07/16/2017 2:36 PM MRN: [id number] Endoscopist: Remo Lipps P. Armbruster MD, MD Age: 57 Referring MD:  Date of Birth: Jul 21, 1960 Gender: Female Account #: [id number] Procedure:                Colonoscopy Indications:              Hematochezia Medicines:                Monitored Anesthesia Care Procedure:                Pre-Anesthesia Assessment:                           - Prior to the procedure, a History and Physical                            was performed, and patient medications and                            allergies were reviewed. The patient's tolerance of                            previous anesthesia was also reviewed. The risks                            and benefits of the procedure and the sedation                            options and risks were discussed with the patient.                            All questions were answered, and informed consent                            was obtained. Prior Anticoagulants: The patient has                            taken no previous anticoagulant or antiplatelet                            agents. ASA Grade Assessment: II - A patient with                            mild systemic disease. After reviewing the risks                            and benefits, the patient was deemed in                            satisfactory condition to undergo the procedure.                           After obtaining informed consent, the colonoscope  was passed under direct vision. Throughout the                            procedure, the patient's blood pressure, pulse, and                            oxygen saturations were monitored continuously. The                            Model PCF-H190DL [id number]) scope was introduced                            through the anus and advanced to the the terminal                            ileum, with identification of the  appendiceal                            orifice and IC valve. The colonoscopy was                            technically difficult and complex due to a tortuous                            colon. The patient tolerated the procedure well.                            The quality of the bowel preparation was good. The                            terminal ileum, ileocecal valve, appendiceal                            orifice, and rectum were photographed. Scope In: 2:51:52 PM Scope Out: 3:14:01 PM Scope Withdrawal Time: 0 hours 17 minutes 50 seconds  Total Procedure Duration: 0 hours 22 minutes 9 seconds  Findings:                 The perianal and digital rectal examinations were                            normal.                           The terminal ileum appeared normal.                           The colon was tortuous.                           A few medium-mouthed diverticula were found in the                            sigmoid colon.  Internal hemorrhoids were found during retroflexion.                           The exam was otherwise without abnormality. Complications:            No immediate complications. Estimated blood loss:                            None. Estimated Blood Loss:     Estimated blood loss: none. Impression:               - The examined portion of the ileum was normal.                           - Tortuous colon.                           - Diverticulosis in the sigmoid colon.                           - Internal hemorrhoids.                           - The examination was otherwise normal.                           - No specimens collected.                           Overall, suspect internal hemorrhoids is the cause                            for bleeding symptoms in the setting of                            constipation. Recommendation:           - Patient has a contact number available for                            emergencies. The signs  and symptoms of potential                            delayed complications were discussed with the                            patient. Return to normal activities tomorrow.                            Written discharge instructions were provided to the                            patient.                           - Resume previous diet.                           -  Continue present medications.                           - Continue Linzess as needed for constipation                           - Topical hydrocortisone for hemorrhoid therapy as                            needed. If symptoms persist please contact the                            clinic for consideration for banding therapy                           - Repeat colonoscopy in 10 years for screening                            purposes. Remo Lipps P. Armbruster MD, MD 07/16/2017 3:19:40 PM This report has been signed electronically.

## 2017-07-16 NOTE — Patient Instructions (Addendum)
YOU HAD AN ENDOSCOPIC PROCEDURE TODAY AT [facility]:   Refer to the procedure report that was given to you for any specific questions about what was found during the examination.  If the procedure report does not answer your questions, please call your gastroenterologist to clarify.  If you requested that your care partner not be given the details of your procedure findings, then the procedure report has been included in a sealed envelope for you to review at your convenience later.  YOU SHOULD EXPECT: Some feelings of bloating in the abdomen. Passage of more gas than usual.  Walking can help get rid of the air that was put into your GI tract during the procedure and reduce the bloating. If you had a lower endoscopy (such as a colonoscopy or flexible sigmoidoscopy) you may notice spotting of blood in your stool or on the toilet paper. If you underwent a bowel prep for your procedure, you may not have a normal bowel movement for a few days.  Please Note:  You might notice some irritation and congestion in your nose or some drainage.  This is from the oxygen used during your procedure.  There is no need for concern and it should clear up in a day or so.  SYMPTOMS TO REPORT IMMEDIATELY:   Following lower endoscopy (colonoscopy or flexible sigmoidoscopy):  Excessive amounts of blood in the stool  Significant tenderness or worsening of abdominal pains  Swelling of the abdomen that is new, acute  Fever of 100F or higher   Following upper endoscopy (EGD)  Vomiting of blood or coffee ground material  New chest pain or pain under the shoulder blades  Painful or persistently difficult swallowing  New shortness of breath  Fever of 100F or higher  Black, tarry-looking stools  For urgent or emergent issues, a gastroenterologist can be reached at any hour by calling [phone].   DIET:  We do recommend a small meal at first, but then you may proceed to your regular diet.  Drink  plenty of fluids but you should avoid alcoholic beverages for 24 hours.  MEDICATIONS: Continue present medications. Continues Linzess as needed for constipation. Patient given Linzess samples (3 bottles) to take 290 mcg 1 tablet by mouth once daily. Use topical hydrocortisone for hemorrhoid therapy as needed.  FOLLOW UP:  If hemorrhoid symptoms persist please contact the clinic for consideration for hemorrhoid banding therapy. Consider esophageal manometry if symptoms persist despite dilation. Consider having a sleep study to rule out sleep apnea breathing patterns that were observed by anesthesia during this procedure. This is a concern for underlying sleep apnea.  Please see handouts given to you by your recovery nurse.  ACTIVITY:  You should plan to take it easy for the rest of today and you should NOT DRIVE or use heavy machinery until tomorrow (because of the sedation medicines used during the test).    FOLLOW UP: Our staff will call the number listed on your records the next business day following your procedure to check on you and address any questions or concerns that you may have regarding the information given to you following your procedure. If we do not reach you, we will leave a message.  However, if you are feeling well and you are not experiencing any problems, there is no need to return our call.  We will assume that you have returned to your regular daily activities without incident.  If any biopsies were taken you will be contacted  by phone or by letter within the next 1-3 weeks.  Please call us at [phone] if you have not heard about the biopsies in 3 weeks.   Thank you for allowing [place] to provide for your healthcare needs today.  SIGNATURES/CONFIDENTIALITY: You and/or your care partner have signed paperwork which will be entered into your electronic medical record.  These signatures attest to the fact that that the information above on your After Visit Summary has been  reviewed and is understood.  Full responsibility of the confidentiality of this discharge information lies with you and/or your care-partner.

## 2017-07-16 NOTE — Progress Notes (Signed)
Spontaneous respirations throughout. VSS. Resting comfortably. To PACU on room air. Report to  RN. 

## 2017-07-16 NOTE — ED Provider Notes (Signed)
[facility] DEPT Provider Note  CSN: [id number] Arrival date & time: 07/16/17 1736  Chief Complaint(s) Chest Pain  HPI [name] is a 57 y.o. female with an extensive past medical history listed below including esophageal stricture who had esophageal dilatation today.  She was sent here following the dilatation due to persistent chest pain with nausea and vomiting.  Patient describes the chest pain as constant moderate to severe substernal, pressure-like, nonradiating.  No alleviating or aggravating factors.  Emesis is consistent with stomach acid.  Nonbloody nonbilious.  Patient also endorsing diarrhea (S/P cleanout for colonoscopy that was also performed today).  She denies any associated shortness of breath, diaphoresis.  No abdominal pain.  No recent fevers or infections.  She denies any other associated symptoms.  Patient reports negative stress test in 2008.  No subsequent catheterizations.  No prior history of MIs.  No prior history of DVT/PEs.  Dr. Havery Moros, who performed the dilatation is also here with the patient.  He reported that following the procedure he obtain a chest x-ray which did not show evidence of pneumomediastinum.  He reports that he did not see any tears in the esophagus during visual inspection following the procedure.   HPI  Past Medical History Past Medical History:  Diagnosis Date  . Allergy    allergic rhinitis  . Arthritis   . Atypical chest pain   . Cancer [facility])    having a biopsy on left breast this friday for abnormal finding  . Constipation   . Cyst, ovarian   . Diabetes mellitus, type II ([place])   . Endometriosis   . Esophageal stricture   . Gastroparesis   . GERD (gastroesophageal reflux disease)   . Heart murmur   . Hyperlipidemia   . Hypertension   . 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 Iron deficiency anemia   . Migraine headache   .  Non-compliance   . Peptic ulcer disease   . Thyroid disease    Patient Active Problem List   Diagnosis Date Noted  . Insomnia 01/12/2017  . Hyperglycemia 10/07/2014  . HTN (hypertension) 09/18/2012  . Breast cyst 06/09/2012  . Hyperthyroidism 02/29/2012  . Unspecified constipation 04/18/2011  . Can't get food down 04/18/2011  . Ulnar neuropathy 03/30/2011  . Low back pain 10/18/2010  . FIBROMYALGIA 07/20/2009  . Hyperlipidemia 05/26/2009  . VITAMIN D DEFICIENCY 05/23/2009  . Iron deficiency anemia due to chronic blood loss 10/25/2008  . GERD 10/25/2008  . LIVER HEMANGIOMA 01/01/2008  . ESOPHAGEAL STRICTURE 01/01/2008  . GASTROPARESIS 01/01/2008  . ALLERGIC RHINITIS 12/08/2007  . Borderline type 2 diabetes mellitus 10/31/2007  . Migraine headache 08/11/2007  . HIATAL HERNIA 08/11/2007  . OVARIAN CYST 08/11/2007  . Essential hypertension, benign 05/06/2007   Home Medication(s) Prior to Admission medications   Medication Sig Start Date End Date Taking? Authorizing Provider  amitriptyline (ELAVIL) 50 MG tablet Take 1 tablet (50 mg total) by mouth at bedtime. 04/24/17  Yes [name], NP  amLODipine (NORVASC) 5 MG tablet Take 1 tablet (5 mg total) by mouth daily. 04/19/17  Yes [name], NP  hydrochlorothiazide (HYDRODIURIL) 25 MG tablet TAKE 1 TABLET BY MOUTH ONCE DAILY 07/12/17  Yes [name], NP  linaclotide [facility]) 145 MCG CAPS capsule Take 1 capsule (145 mcg total) by mouth daily before breakfast. 06/13/17  Yes Armbruster, Carlota Raspberry, MD  meloxicam (MOBIC) 7.5 MG tablet Take 1 tablet (7.5 mg  total) by mouth daily. 06/24/17  Yes [name], NP  methimazole (TAPAZOLE) 5 MG tablet Take 1 tablet (5 mg total) 3 (three) times a week by mouth. 05/08/17  Yes Renato Shin, MD  methocarbamol (ROBAXIN) 500 MG tablet Take 1 tablet (500 mg total) by mouth every 8 (eight) hours as needed for muscle spasms. 06/24/17  Yes [name], NP  nystatin  (MYCOSTATIN/NYSTOP) powder Apply topically 2 (two) times daily. 06/24/17  Yes [name], NP  ondansetron (ZOFRAN ODT) 4 MG disintegrating tablet Take 1 tablet (4 mg total) by mouth every 8 (eight) hours as needed for nausea or vomiting. 05/17/17  Yes Wendling, Crosby Oyster, DO  potassium chloride SA (K-DUR,KLOR-CON) 20 MEQ tablet Take 1 tablet (20 mEq total) by mouth daily. 04/19/17  Yes [name], NP  rosuvastatin (CRESTOR) 40 MG tablet TAKE 1 TABLET BY MOUTH EVERY OTHER DAY 07/12/17  Yes [name], NP  SUMAtriptan (IMITREX) 50 MG tablet May repeat in 2 hours if headache not resolved. Max 2 tabs/24 hrs 04/19/17  Yes [name], NP  traZODone (DESYREL) 50 MG tablet Take 0.5-1 tablets (25-50 mg total) by mouth at bedtime as needed for sleep. 04/19/17  Yes [name], NP  ondansetron (ZOFRAN) 4 MG tablet Take 1 tablet (4 mg total) by mouth every 8 (eight) hours as needed for nausea or vomiting. Patient not taking: Reported on 07/16/2017 07/16/17   Yetta Flock, MD  promethazine (PHENADOZ) 25 MG suppository INSERT ONE SUPPOSITORY RECTALLY AS NEEDED Patient not taking: Reported on 07/16/2017 07/26/15   [name], NP                                                                                                                                    Past Surgical History Past Surgical History:  Procedure Laterality Date  . ABDOMINAL EXPLORATION SURGERY     w/bso   . CARDIAC CATHETERIZATION  2009   mild non obstructive CAD  . CHOLECYSTECTOMY    . KNEE SURGERY  2005    left knee  . TOTAL ABDOMINAL HYSTERECTOMY     Family History Family History  Problem Relation Age of Onset  . Hypertension Mother   . Rheum arthritis Mother   . Hypertension Father   . Diabetes Father   . Prostate cancer Father   . Kidney disease Father   . Diabetes Sister   . Fibromyalgia Sister   . Diabetes Maternal Grandmother   . Lung cancer Maternal Grandfather    . Colon cancer Neg Hx   . Thyroid disease Neg Hx     Social History Social History   Tobacco Use  . Smoking status: Former Smoker    Packs/day: 0.50    Years: 18.00    Pack years: 9.00    Types: Cigarettes    Start date: 07/29/1977    Last attempt to quit: 06/25/1994    Years since quitting: 23.0  .  Smokeless tobacco: Never Used  . Tobacco comment: quit 19 years ago  Substance Use Topics  . Alcohol use: Yes    Alcohol/week: 0.0 oz    Comment: social drinker  . Drug use: No   Allergies Ace inhibitors; Celebrex [celecoxib]; and Diovan [valsartan]  Review of Systems Review of Systems All other systems are reviewed and are negative for acute change except as noted in the HPI  Physical Exam Vital Signs  I have reviewed the triage vital signs BP 117/86   Pulse 78   Temp 98.5 F (36.9 C) (Oral)   Resp 12   SpO2 100%   Physical Exam  Constitutional: She is oriented to person, place, and time. She appears well-developed and well-nourished. No distress.  HENT:  Head: Normocephalic and atraumatic.  Nose: Nose normal.  Eyes: Conjunctivae and EOM are normal. Pupils are equal, round, and reactive to light. Right eye exhibits no discharge. Left eye exhibits no discharge. No scleral icterus.  Neck: Normal range of motion. Neck supple.  Cardiovascular: Normal rate and regular rhythm. Exam reveals no gallop and no friction rub.  No murmur heard. Pulmonary/Chest: Effort normal and breath sounds normal. No stridor. No respiratory distress. She has no rales.  Abdominal: Soft. She exhibits no distension. There is no tenderness. There is no rigidity, no rebound, no guarding and negative Murphy's sign.  Musculoskeletal: She exhibits no edema or tenderness.  Neurological: She is alert and oriented to person, place, and time.  Skin: Skin is warm and dry. No rash noted. She is not diaphoretic. No erythema.  Psychiatric: She has a normal mood and affect.  Vitals reviewed.   ED Results  and Treatments Labs (all labs ordered are listed, but only abnormal results are displayed) Labs Reviewed  CBC - Abnormal; Notable for the following components:      Result Value   Hemoglobin 11.5 (*)    MCV 74.1 (*)    MCH 22.9 (*)    All other components within normal limits  COMPREHENSIVE METABOLIC PANEL - Abnormal; Notable for the following components:   Potassium 2.9 (*)    Glucose, Bld 143 (*)    Calcium 8.8 (*)    All other components within normal limits  I-STAT TROPONIN, ED - Abnormal; Notable for the following components:   Troponin i, poc 0.11 (*)    All other components within normal limits  LIPASE, BLOOD                                                                                                                         EKG  EKG Interpretation  Date/Time:  Tuesday July 16 2017 18:34:07 EST Ventricular Rate:  87 PR Interval:    QRS Duration: 107 QT Interval:  417 QTC Calculation: 502 R Axis:   1 Text Interpretation:  Sinus rhythm Low voltage, precordial leads Borderline prolonged QT interval Baseline wander in lead(s) II III aVF No significant change since last tracing Confirmed by Addison Lank [phone])  on 07/16/2017 6:36:39 PM      Radiology Dg Chest 2 View  Result Date: 07/16/2017 CLINICAL DATA:  Chest pain.  Status post endoscopy. EXAM: CHEST  2 VIEW COMPARISON:  05/25/2009 FINDINGS: The heart size and mediastinal contours are within normal limits. No evidence of pneumomediastinum. Lung volumes are low bilaterally. There is no evidence of pulmonary edema, consolidation, pneumothorax, nodule or pleural fluid. The visualized skeletal structures are unremarkable. IMPRESSION: No acute findings in the chest. Electronically Signed   By: Aletta Edouard M.D.   On: 07/16/2017 16:42   Ct Chest Wo Contrast  Result Date: 07/16/2017 CLINICAL DATA:  Chest pain following esophageal dilatation EXAM: CT CHEST WITHOUT CONTRAST TECHNIQUE: Multidetector CT imaging of the  chest was performed following the standard protocol without IV contrast. COMPARISON:  Chest CT May 06, 2007; chest radiograph July 16, 2017 FINDINGS: Cardiovascular: No thoracic aortic aneurysm is appreciable. Visualized great vessels appear unremarkable. Right and left common carotid arteries arise as a common trunk, an anatomic variant. There is aortic atherosclerosis. There are foci coronary artery calcification. There is no pericardial effusion or pericardial thickening. Mediastinum/Nodes: Thyroid appears unremarkable. There is no appreciable thoracic adenopathy. There is air in the esophagus to the level of the distal esophagus approximately 6 cm from the gastroesophageal junction. The appearance suggests that there may be edema in the wall of the esophagus in this area. There is no periesophageal soft tissue air or pneumomediastinum. Lungs/Pleura: There is bibasilar atelectatic change. There is no parenchymal lung edema or consolidation. No pneumothorax evident. No pleural effusion or pleural thickening. Upper Abdomen: Gallbladder is absent. Visualized upper abdominal structures otherwise appear unremarkable. Musculoskeletal: No blastic or lytic bone lesions are evident. IMPRESSION: 1. There is wall thickening with probable edema in the distal esophagus. There is air in the esophagus proximal to this area of wall thickening distally. No esophageal disruption or periesophageal air seen on this noncontrast enhanced study. There is no pneumomediastinum or pneumothorax. 2.  No lung edema or consolidation.  Mild bibasilar atelectasis. 3.  No appreciable adenopathy. 4. Foci of aortic atherosclerosis and coronary artery calcification. 5.  Gallbladder absent. Aortic Atherosclerosis (ICD10-I70.0). Electronically Signed   By: Lowella Grip III M.D.   On: 07/16/2017 18:22   Pertinent labs & imaging results that were available during my care of the patient were reviewed by me and considered in my medical  decision making (see chart for details).  Medications Ordered in ED Medications  sodium chloride 0.9 % bolus 1,000 mL (not administered)  aspirin chewable tablet 324 mg (not administered)  morphine 4 MG/ML injection 4 mg (4 mg Intravenous Given 07/16/17 1815)  sodium chloride 0.9 % bolus 1,000 mL (1,000 mLs Intravenous New Bag/Given 07/16/17 1856)  morphine 4 MG/ML injection 4 mg (4 mg Intravenous Given 07/16/17 1856)  ondansetron (ZOFRAN) injection 4 mg (4 mg Intravenous Given 07/16/17 1856)  Procedures Procedures CRITICAL CARE Performed by: Grayce Sessions Nitika Jackowski Total critical care time: 30 minutes Critical care time was exclusive of separately billable procedures and treating other patients. Critical care was necessary to treat or prevent imminent or life-threatening deterioration. Critical care was time spent personally by me on the following activities: development of treatment plan with patient and/or surrogate as well as nursing, discussions with consultants, evaluation of patient's response to treatment, examination of patient, obtaining history from patient or surrogate, ordering and performing treatments and interventions, ordering and review of laboratory studies, ordering and review of radiographic studies, pulse oximetry and re-evaluation of patient's condition.   (including critical care time)  Medical Decision Making / ED Course I have reviewed the nursing notes for this encounter and the patient's prior records (if available in EHR or on provided paperwork).    Post esophageal dilatation chest pain.  CT obtained which did not reveal any pneumomediastinum concerning for esophageal tear.  They did note some distal esophageal wall thickening likely secondary to the dilatation.  EKG did not reveal evidence of acute ischemia or pericarditis.  Her initial  troponin is elevated at 0.11.  Unsure if this is due to local irritation from the procedure and the localized esophageal wall thickening versus ACS versus peri-procedural vasospasm.  I have low suspicion for pulmonary embolism.  Presentation is not classic for aortic dissection.  Will discuss case with the hospitalist regarding admission for troponin trending.  Final Clinical Impression(s) / ED Diagnoses Final diagnoses:  Precordial pain  Elevated troponin      This chart was dictated using voice recognition software.  Despite best efforts to proofread,  errors can occur which can change the documentation meaning.   Fatima Blank, MD 07/17/17 0010

## 2017-07-16 NOTE — Op Note (Signed)
[name] Patient Name: [name] Procedure Date: 07/16/2017 2:36 PM MRN: [id number] Endoscopist: Remo Lipps P. Armbruster MD, MD Age: 57 Referring MD:  Date of Birth: April 11, 1961 Gender: Female Account #: [id number] Procedure:                Upper GI endoscopy Indications:              Dysphagia Medicines:                Monitored Anesthesia Care Procedure:                Pre-Anesthesia Assessment:                           - Prior to the procedure, a History and Physical                            was performed, and patient medications and                            allergies were reviewed. The patient's tolerance of                            previous anesthesia was also reviewed. The risks                            and benefits of the procedure and the sedation                            options and risks were discussed with the patient.                            All questions were answered, and informed consent                            was obtained. Prior Anticoagulants: The patient has                            taken no previous anticoagulant or antiplatelet                            agents. ASA Grade Assessment: II - A patient with                            mild systemic disease. After reviewing the risks                            and benefits, the patient was deemed in                            satisfactory condition to undergo the procedure.                           After obtaining informed consent, the endoscope was  passed under direct vision. Throughout the                            procedure, the patient's blood pressure, pulse, and                            oxygen saturations were monitored continuously. The                            Endoscope was introduced through the mouth, and                            advanced to the second part of duodenum. The upper                            GI endoscopy was accomplished without  difficulty.                            The patient tolerated the procedure well. Scope In: Scope Out: Findings:                 Esophagogastric landmarks were identified: the                            Z-line was found at 35 cm, the gastroesophageal                            junction was found at 35 cm and the upper extent of                            the gastric folds was found at 37 cm from the                            incisors.                           A 2 cm hiatal hernia was present.                           One suspected mild benign-appearing, intrinsic                            stenosis was found 35 cm from the incisors. This                            measured less than one cm (in length). A TTS                            dilator was passed through the scope. Dilation with                            an 18-19-20 mm balloon dilator was performed to 18  mm, 19 mm and 20 mm. No mucosal wrents were                            appreciated.                           The exam of the esophagus was otherwise normal.                           The entire examined stomach was normal.                           A single suspected 5 mm sessile polyp was found in                            the second portion of the duodenum. I suspect the                            ampulla was along the opposite wall of the duodenum                            but did not clearly visualize it. Biopsies were                            taken with a cold forceps for histology to rule out                            adenomatous change.                           The exam of the duodenum was otherwise normal. Complications:            No immediate complications. Estimated blood loss:                            Minimal. Estimated Blood Loss:     Estimated blood loss was minimal. Impression:               - Esophagogastric landmarks identified.                           - 2 cm hiatal  hernia.                           - One suspected benign-appearing esophageal                            stenosis. Dilated to 70mm without mucosal wrents.                           - Normal stomach.                           - A single suspected duodenal polyp versus less  likely ampulla. Biopsied. Recommendation:           - Patient has a contact number available for                            emergencies. The signs and symptoms of potential                            delayed complications were discussed with the                            patient. Return to normal activities tomorrow.                            Written discharge instructions were provided to the                            patient.                           - Resume previous diet.                           - Continue present medications.                           - Await pathology results.                           - Consideration for esophageal manometry if                            symptoms persist despite dilation                           - Per anesthesia, consideration for sleep study to                            rule out sleep apnea given observed breathing                            patterns during this procedure, concerned for                            underlying sleep apnea Remo Lipps P. Armbruster MD, MD 07/16/2017 3:29:15 PM This report has been signed electronically.

## 2017-07-16 NOTE — ED Notes (Signed)
Bed: WA23 Expected date:  Expected time:  Means of arrival:  Comments: ems 

## 2017-07-16 NOTE — ED Notes (Signed)
Pt transported to CT, will get vitals and EKG once pt returns

## 2017-07-16 NOTE — H&P (Signed)
History and Physical    [name] MCN:[id number] DOB: April 12, 1961 DOA: 07/16/2017  PCP: [name], NP  Patient coming from: Home.  Chief Complaint: Chest pain.  HPI: [name] is a 57 y.o. female with history of hypertension, hypothyroidism and esophageal stricture had undergone esophageal dilation following which patient started developing severe chest pain earlier today.  Patient had immediate chest x-ray which did not show anything acute.  Since there was concern for perforation patient was referred to the ER and also patient has risk factors for cardiac etiology of the pain.  Patient had multiple episodes of vomiting.  Chest pain was crushing in nature.  No associated shortness of breath.  ED Course: In the ER CT chest without contrast did not show any perforation.  Troponin was mildly elevated.  Patient chest pain improved with morphine but did not completely subside.  EKG was showing normal sinus rhythm.  Patient is being admitted for further observation.  At the time of my exam patient is not in distress.  Abdomen appears benign.  Review of Systems: As per HPI, rest all negative.   Past Medical History:  Diagnosis Date  . Allergy    allergic rhinitis  . Arthritis   . Atypical chest pain   . Cancer [facility])    having a biopsy on left breast this friday for abnormal finding  . Constipation   . Cyst, ovarian   . Diabetes mellitus, type II ([place])   . Endometriosis   . Esophageal stricture   . Gastroparesis   . GERD (gastroesophageal reflux disease)   . Heart murmur   . Hyperlipidemia   . Hypertension   . 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 Iron deficiency anemia   . Migraine headache   . Non-compliance   . Peptic ulcer disease   . Thyroid disease     Past Surgical History:  Procedure Laterality Date  . ABDOMINAL EXPLORATION SURGERY     w/bso   . CARDIAC CATHETERIZATION  2009   mild non  obstructive CAD  . CHOLECYSTECTOMY    . KNEE SURGERY  2005    left knee  . TOTAL ABDOMINAL HYSTERECTOMY       reports that she quit smoking about 23 years ago. Her smoking use included cigarettes. She started smoking about 39 years ago. She has a 9.00 pack-year smoking history. she has never used smokeless tobacco. She reports that she drinks alcohol. She reports that she does not use drugs.  Allergies  Allergen Reactions  . Ace Inhibitors     REACTION: chronic cough  . Celebrex [Celecoxib] Other (See Comments)    "makes me bleed"  . Diovan [Valsartan]     angioedema    Family History  Problem Relation Age of Onset  . Hypertension Mother   . Rheum arthritis Mother   . Hypertension Father   . Diabetes Father   . Prostate cancer Father   . Kidney disease Father   . Diabetes Sister   . Fibromyalgia Sister   . Diabetes Maternal Grandmother   . Lung cancer Maternal Grandfather   . Colon cancer Neg Hx   . Thyroid disease Neg Hx     Prior to Admission medications   Medication Sig Start Date End Date Taking? Authorizing Provider  amitriptyline (ELAVIL) 50 MG tablet Take 1 tablet (50 mg total) by mouth at bedtime. 04/24/17  Yes [name], NP  amLODipine (NORVASC) 5 MG  tablet Take 1 tablet (5 mg total) by mouth daily. 04/19/17  Yes [name], NP  hydrochlorothiazide (HYDRODIURIL) 25 MG tablet TAKE 1 TABLET BY MOUTH ONCE DAILY 07/12/17  Yes [name], NP  linaclotide [facility]) 145 MCG CAPS capsule Take 1 capsule (145 mcg total) by mouth daily before breakfast. 06/13/17  Yes Armbruster, Carlota Raspberry, MD  meloxicam (MOBIC) 7.5 MG tablet Take 1 tablet (7.5 mg total) by mouth daily. 06/24/17  Yes [name], NP  methimazole (TAPAZOLE) 5 MG tablet Take 1 tablet (5 mg total) 3 (three) times a week by mouth. 05/08/17  Yes Renato Shin, MD  methocarbamol (ROBAXIN) 500 MG tablet Take 1 tablet (500 mg total) by mouth every 8 (eight) hours as needed for muscle  spasms. 06/24/17  Yes [name], NP  nystatin (MYCOSTATIN/NYSTOP) powder Apply topically 2 (two) times daily. 06/24/17  Yes [name], NP  ondansetron (ZOFRAN ODT) 4 MG disintegrating tablet Take 1 tablet (4 mg total) by mouth every 8 (eight) hours as needed for nausea or vomiting. 05/17/17  Yes Wendling, Crosby Oyster, DO  potassium chloride SA (K-DUR,KLOR-CON) 20 MEQ tablet Take 1 tablet (20 mEq total) by mouth daily. 04/19/17  Yes [name], NP  rosuvastatin (CRESTOR) 40 MG tablet TAKE 1 TABLET BY MOUTH EVERY OTHER DAY 07/12/17  Yes [name], NP  SUMAtriptan (IMITREX) 50 MG tablet May repeat in 2 hours if headache not resolved. Max 2 tabs/24 hrs 04/19/17  Yes [name], NP  traZODone (DESYREL) 50 MG tablet Take 0.5-1 tablets (25-50 mg total) by mouth at bedtime as needed for sleep. 04/19/17  Yes [name], NP  ondansetron (ZOFRAN) 4 MG tablet Take 1 tablet (4 mg total) by mouth every 8 (eight) hours as needed for nausea or vomiting. Patient not taking: Reported on 07/16/2017 07/16/17   Yetta Flock, MD  promethazine (PHENADOZ) 25 MG suppository INSERT ONE SUPPOSITORY RECTALLY AS NEEDED Patient not taking: Reported on 07/16/2017 07/26/15   [name], NP    Physical Exam: Vitals:   07/16/17 1900 07/16/17 1930 07/16/17 2000 07/16/17 2100  BP: 138/83 117/86 134/83 128/84  Pulse:  78    Resp: 10 12 14 18   Temp:      TempSrc:      SpO2:  100%        Constitutional: Moderately built and nourished. Vitals:   07/16/17 1900 07/16/17 1930 07/16/17 2000 07/16/17 2100  BP: 138/83 117/86 134/83 128/84  Pulse:  78    Resp: 10 12 14 18   Temp:      TempSrc:      SpO2:  100%     Eyes: Anicteric no pallor. ENMT: No discharge from the ears eyes nose or mouth. Neck: No mass felt.  No neck rigidity. Respiratory: No rhonchi or crepitations. Cardiovascular: S1-S2 heard no murmurs appreciated. Abdomen: Soft nontender bowel  sounds present. Musculoskeletal: No edema.  No joint effusion. Skin: No rash. Neurologic: Alert awake oriented to time place and person.  Moves all extremities. Psychiatric: Appears normal.   Labs on Admission: I have personally reviewed following labs and imaging studies  CBC: Recent Labs  Lab 07/16/17 1746  WBC 10.3  HGB 11.5*  HCT 37.2  MCV 74.1*  PLT 814   Basic Metabolic Panel: Recent Labs  Lab 07/16/17 1746  NA 140  K 2.9*  CL 108  CO2 24  GLUCOSE 143*  BUN 10  CREATININE 0.73  CALCIUM 8.8*   GFR: Estimated Creatinine Clearance: 89.5 mL/min (by C-G formula  based on SCr of 0.73 mg/dL). Liver Function Tests: Recent Labs  Lab 07/16/17 1746  AST 28  ALT 26  ALKPHOS 125  BILITOT 0.3  PROT 7.9  ALBUMIN 3.8   Recent Labs  Lab 07/16/17 1746  LIPASE 26   No results for input(s): AMMONIA in the last 168 hours. Coagulation Profile: No results for input(s): INR, PROTIME in the last 168 hours. Cardiac Enzymes: No results for input(s): CKTOTAL, CKMB, CKMBINDEX, TROPONINI in the last 168 hours. BNP (last 3 results) No results for input(s): PROBNP in the last 8760 hours. HbA1C: No results for input(s): HGBA1C in the last 72 hours. CBG: No results for input(s): GLUCAP in the last 168 hours. Lipid Profile: No results for input(s): CHOL, HDL, LDLCALC, TRIG, CHOLHDL, LDLDIRECT in the last 72 hours. Thyroid Function Tests: No results for input(s): TSH, T4TOTAL, FREET4, T3FREE, THYROIDAB in the last 72 hours. Anemia Panel: No results for input(s): VITAMINB12, FOLATE, FERRITIN, TIBC, IRON, RETICCTPCT in the last 72 hours. Urine analysis:    Component Value Date/Time   COLORURINE YELLOW 08/17/2014 0550   APPEARANCEUR CLOUDY (A) 08/17/2014 0550   LABSPEC 1.022 08/17/2014 0550   LABSPEC 1.030 09/17/2012 1119   PHURINE 8.0 08/17/2014 0550   GLUCOSEU NEGATIVE 08/17/2014 0550   GLUCOSEU NEGATIVE 11/15/2010 1518   HGBUR NEGATIVE 08/17/2014 0550   HGBUR  trace-intact 11/01/2009 0000   BILIRUBINUR Neg 02/02/2016 1254   KETONESUR 40 (A) 08/17/2014 0550   PROTEINUR Neg 02/02/2016 1254   PROTEINUR NEGATIVE 08/17/2014 0550   UROBILINOGEN 4.0 02/02/2016 1254   UROBILINOGEN 0.2 08/17/2014 0550   UROBILINOGEN 0.2 09/17/2012 1119   NITRITE Neg 02/02/2016 1254   NITRITE NEGATIVE 08/17/2014 0550   LEUKOCYTESUR Negative 02/02/2016 1254   Sepsis Labs: @LABRCNTIP (procalcitonin:4,lacticidven:4) )No results found for this or any previous visit (from the past 240 hour(s)).   Radiological Exams on Admission: Dg Chest 2 View  Result Date: 07/16/2017 CLINICAL DATA:  Chest pain.  Status post endoscopy. EXAM: CHEST  2 VIEW COMPARISON:  05/25/2009 FINDINGS: The heart size and mediastinal contours are within normal limits. No evidence of pneumomediastinum. Lung volumes are low bilaterally. There is no evidence of pulmonary edema, consolidation, pneumothorax, nodule or pleural fluid. The visualized skeletal structures are unremarkable. IMPRESSION: No acute findings in the chest. Electronically Signed   By: Aletta Edouard M.D.   On: 07/16/2017 16:42   Ct Chest Wo Contrast  Result Date: 07/16/2017 CLINICAL DATA:  Chest pain following esophageal dilatation EXAM: CT CHEST WITHOUT CONTRAST TECHNIQUE: Multidetector CT imaging of the chest was performed following the standard protocol without IV contrast. COMPARISON:  Chest CT May 06, 2007; chest radiograph July 16, 2017 FINDINGS: Cardiovascular: No thoracic aortic aneurysm is appreciable. Visualized great vessels appear unremarkable. Right and left common carotid arteries arise as a common trunk, an anatomic variant. There is aortic atherosclerosis. There are foci coronary artery calcification. There is no pericardial effusion or pericardial thickening. Mediastinum/Nodes: Thyroid appears unremarkable. There is no appreciable thoracic adenopathy. There is air in the esophagus to the level of the distal esophagus  approximately 6 cm from the gastroesophageal junction. The appearance suggests that there may be edema in the wall of the esophagus in this area. There is no periesophageal soft tissue air or pneumomediastinum. Lungs/Pleura: There is bibasilar atelectatic change. There is no parenchymal lung edema or consolidation. No pneumothorax evident. No pleural effusion or pleural thickening. Upper Abdomen: Gallbladder is absent. Visualized upper abdominal structures otherwise appear unremarkable. Musculoskeletal: No blastic or lytic bone  lesions are evident. IMPRESSION: 1. There is wall thickening with probable edema in the distal esophagus. There is air in the esophagus proximal to this area of wall thickening distally. No esophageal disruption or periesophageal air seen on this noncontrast enhanced study. There is no pneumomediastinum or pneumothorax. 2.  No lung edema or consolidation.  Mild bibasilar atelectasis. 3.  No appreciable adenopathy. 4. Foci of aortic atherosclerosis and coronary artery calcification. 5.  Gallbladder absent. Aortic Atherosclerosis (ICD10-I70.0). Electronically Signed   By: Lowella Grip III M.D.   On: 07/16/2017 18:22    EKG: Independently reviewed.   Assessment/Plan Principal Problem:   Chest pain Active Problems:   Hyperlipidemia   Iron deficiency anemia due to chronic blood loss   Hyperthyroidism   HTN (hypertension)    1. Chest pain with positive troponin -chest pain likely from EGD and esophageal dilation.  However patient has turned to be having positive troponin.  Given the risk factors including hypertension and family history we will cycle cardiac markers check 2D echo and I have consulted cardiology.  Will keep patient n.p.o. and on Protonix IV and also await for further recommendations from GI.  Continue with gentle hydration.  Pain relief medications morphine has been ordered. 2. Hypokalemia likely from vomiting -replace and recheck.  Check magnesium  levels. 3. History of iron deficiency anemia follow CBC.  Presently n.p.o. 4. History of hyperthyroidism on methimazole 3 times a week presently n.p.o. may restart once patient can take orally.  Closely watch out for any signs of thyrotoxicosis. 5. Hypertension -since patient is n.p.o. I have placed patient on PRN IV hydralazine.   DVT prophylaxis: SCDs. Code Status: Full code. Family Communication: Discussed with patient. Disposition Plan: Home. Consults called: Cardiology. Admission status: Observation.   Rise Patience MD [facility] Pager [phone].  If 7PM-7AM, please contact night-coverage [email or web address] Password [facility]  07/16/2017, 10:24 PM

## 2017-07-17 ENCOUNTER — Other Ambulatory Visit (HOSPITAL_COMMUNITY): Payer: BC Managed Care – PPO

## 2017-07-17 ENCOUNTER — Encounter (HOSPITAL_COMMUNITY)
Admission: EM | Disposition: A | Discharge status: Home / Self Care | Payer: Self-pay | Source: Home / Self Care | Attending: Emergency Medicine

## 2017-07-17 ENCOUNTER — Other Ambulatory Visit: Payer: Self-pay

## 2017-07-17 ENCOUNTER — Telehealth: Payer: Self-pay | Admitting: *Deleted

## 2017-07-17 DIAGNOSIS — I10 Essential (primary) hypertension: Secondary | ICD-10-CM

## 2017-07-17 DIAGNOSIS — I214 Non-ST elevation (NSTEMI) myocardial infarction: Secondary | ICD-10-CM | POA: Diagnosis not present

## 2017-07-17 DIAGNOSIS — I700 Atherosclerosis of aorta: Secondary | ICD-10-CM

## 2017-07-17 DIAGNOSIS — E785 Hyperlipidemia, unspecified: Secondary | ICD-10-CM | POA: Diagnosis not present

## 2017-07-17 DIAGNOSIS — K222 Esophageal obstruction: Secondary | ICD-10-CM | POA: Diagnosis not present

## 2017-07-17 DIAGNOSIS — R072 Precordial pain: Secondary | ICD-10-CM | POA: Diagnosis not present

## 2017-07-17 DIAGNOSIS — R079 Chest pain, unspecified: Secondary | ICD-10-CM | POA: Diagnosis not present

## 2017-07-17 HISTORY — PX: LEFT HEART CATH AND CORONARY ANGIOGRAPHY: CATH118249

## 2017-07-17 LAB — TROPONIN I
Troponin I: 0.41 ng/mL (ref <0.03)
Troponin I: 0.13 ng/mL (ref <0.03)

## 2017-07-17 LAB — HEPATIC FUNCTION PANEL
ALT: 22 U/L (ref 14–54)
AST: 35 U/L (ref 15–41)
Albumin: 3.4 g/dL — ABNORMAL LOW (ref 3.5–5.0)
Alkaline Phosphatase: 111 U/L (ref 38–126)
Bilirubin, Direct: <0.1 mg/dL — ABNORMAL LOW (ref 0.1–0.5)
Total Bilirubin: 0.4 mg/dL (ref 0.3–1.2)
Total Protein: 7.2 g/dL (ref 6.5–8.1)

## 2017-07-17 LAB — CBC
HCT: 32.5 % — ABNORMAL LOW (ref 36.0–46.0)
HCT: 33.1 % — ABNORMAL LOW (ref 36.0–46.0)
Hemoglobin: 9.9 g/dL — ABNORMAL LOW (ref 12.0–15.0)
Hemoglobin: 10.1 g/dL — ABNORMAL LOW (ref 12.0–15.0)
MCH: 22.6 pg — ABNORMAL LOW (ref 26.0–34.0)
MCH: 22.8 pg — ABNORMAL LOW (ref 26.0–34.0)
MCHC: 30.5 g/dL (ref 30.0–36.0)
MCHC: 30.5 g/dL (ref 30.0–36.0)
MCV: 74 fL — ABNORMAL LOW (ref 78.0–100.0)
MCV: 74.7 fL — ABNORMAL LOW (ref 78.0–100.0)
Platelets: 254 10*3/uL (ref 150–400)
Platelets: 221 10*3/uL (ref 150–400)
RBC: 4.39 MIL/uL (ref 3.87–5.11)
RBC: 4.43 MIL/uL (ref 3.87–5.11)
RDW: 14.3 % (ref 11.5–15.5)
RDW: 14.4 % (ref 11.5–15.5)
WBC: 9.8 10*3/uL (ref 4.0–10.5)
WBC: 7.8 10*3/uL (ref 4.0–10.5)

## 2017-07-17 LAB — CREATININE, SERUM
Creatinine, Ser: 0.74 mg/dL (ref 0.44–1.00)
GFR calc Af Amer: >60 mL/min (ref >60)
GFR calc non Af Amer: >60 mL/min (ref >60)

## 2017-07-17 LAB — GLUCOSE, CAPILLARY
Glucose-Capillary: 106 mg/dL — ABNORMAL HIGH (ref 65–99)
Glucose-Capillary: 113 mg/dL — ABNORMAL HIGH (ref 65–99)
Glucose-Capillary: 119 mg/dL — ABNORMAL HIGH (ref 65–99)
Glucose-Capillary: 114 mg/dL — ABNORMAL HIGH (ref 65–99)

## 2017-07-17 LAB — BASIC METABOLIC PANEL
Anion gap: 7 (ref 5–15)
BUN: 10 mg/dL (ref 6–20)
CO2: 25 mmol/L (ref 22–32)
Calcium: 8 mg/dL — ABNORMAL LOW (ref 8.9–10.3)
Chloride: 106 mmol/L (ref 101–111)
Creatinine, Ser: 0.79 mg/dL (ref 0.44–1.00)
GFR calc Af Amer: >60 mL/min (ref >60)
GFR calc non Af Amer: >60 mL/min (ref >60)
Glucose, Bld: 117 mg/dL — ABNORMAL HIGH (ref 65–99)
Potassium: 3.4 mmol/L — ABNORMAL LOW (ref 3.5–5.1)
Sodium: 138 mmol/L (ref 135–145)

## 2017-07-17 LAB — BLOOD GAS, VENOUS
Acid-base deficit: 0.1 mmol/L (ref 0.0–2.0)
Bicarbonate: 25.1 mmol/L (ref 20.0–28.0)
O2 Saturation: 47.6 %
Patient temperature: 98.6
pCO2, Ven: 45.9 mmHg (ref 44.0–60.0)
pH, Ven: 7.357 (ref 7.250–7.430)

## 2017-07-17 LAB — MAGNESIUM: Magnesium: 1.6 mg/dL — ABNORMAL LOW (ref 1.7–2.4)

## 2017-07-17 LAB — LIPID PANEL
Cholesterol: 263 mg/dL — ABNORMAL HIGH (ref 0–200)
HDL: 39 mg/dL — ABNORMAL LOW (ref >40)
LDL Cholesterol: 198 mg/dL — ABNORMAL HIGH (ref 0–99)
Total CHOL/HDL Ratio: 6.7 RATIO
Triglycerides: 131 mg/dL (ref <150)
VLDL: 26 mg/dL (ref 0–40)

## 2017-07-17 LAB — HIV ANTIBODY (ROUTINE TESTING W REFLEX): HIV Screen 4th Generation wRfx: NONREACTIVE

## 2017-07-17 SURGERY — LEFT HEART CATH AND CORONARY ANGIOGRAPHY
Anesthesia: LOCAL

## 2017-07-17 MED ORDER — VERAPAMIL HCL 2.5 MG/ML IV SOLN
INTRAVENOUS | Status: DC | PRN
  Administered 2017-07-17: 10 mL via INTRA_ARTERIAL

## 2017-07-17 MED ORDER — HEPARIN SODIUM (PORCINE) 1000 UNIT/ML IJ SOLN
INTRAMUSCULAR | Status: AC
  Filled 2017-07-17: qty 1

## 2017-07-17 MED ORDER — SODIUM CHLORIDE 0.9% FLUSH: 3.0000 mL | INTRAVENOUS | Status: DC | PRN

## 2017-07-17 MED ORDER — MIDAZOLAM HCL 2 MG/2ML IJ SOLN
INTRAMUSCULAR | Status: AC
  Filled 2017-07-17: qty 2

## 2017-07-17 MED ORDER — FENTANYL CITRATE (PF) 100 MCG/2ML IJ SOLN
INTRAMUSCULAR | Status: AC
  Filled 2017-07-17: qty 2

## 2017-07-17 MED ORDER — SODIUM CHLORIDE 0.9% FLUSH
3.0000 mL | Freq: Two times a day (BID) | INTRAVENOUS | Status: DC
  Administered 2017-07-17 – 2017-07-18 (×2): 3 mL via INTRAVENOUS

## 2017-07-17 MED ORDER — SODIUM CHLORIDE 0.9 % WEIGHT BASED INFUSION: 3.0000 mL/kg/h | INTRAVENOUS | Status: DC

## 2017-07-17 MED ORDER — IOPAMIDOL (ISOVUE-370) INJECTION 76%
INTRAVENOUS | Status: DC | PRN
  Administered 2017-07-17: 80 mL via INTRA_ARTERIAL

## 2017-07-17 MED ORDER — HEPARIN BOLUS VIA INFUSION
4000.0000 [IU] | INTRAVENOUS | Status: AC
  Administered 2017-07-17: 4000 [IU] via INTRAVENOUS
  Filled 2017-07-17: qty 4000

## 2017-07-17 MED ORDER — HEPARIN SODIUM (PORCINE) 1000 UNIT/ML IJ SOLN
INTRAMUSCULAR | Status: DC | PRN
  Administered 2017-07-17: 5000 [IU] via INTRAVENOUS

## 2017-07-17 MED ORDER — HEPARIN SODIUM (PORCINE) 5000 UNIT/ML IJ SOLN
5000.0000 [IU] | Freq: Three times a day (TID) | INTRAMUSCULAR | Status: DC
  Administered 2017-07-17 – 2017-07-18 (×2): 5000 [IU] via SUBCUTANEOUS
  Filled 2017-07-17 (×2): qty 1

## 2017-07-17 MED ORDER — MAGNESIUM OXIDE 400 (241.3 MG) MG PO TABS
800.0000 mg | ORAL_TABLET | Freq: Two times a day (BID) | ORAL | Status: AC
  Administered 2017-07-17 – 2017-07-18 (×2): 800 mg via ORAL
  Filled 2017-07-17 (×2): qty 2

## 2017-07-17 MED ORDER — ASPIRIN 81 MG PO CHEW: 81.0000 mg | CHEWABLE_TABLET | ORAL | Status: DC

## 2017-07-17 MED ORDER — SODIUM CHLORIDE 0.9% FLUSH: 3.0000 mL | Freq: Two times a day (BID) | INTRAVENOUS | Status: DC

## 2017-07-17 MED ORDER — FENTANYL CITRATE (PF) 100 MCG/2ML IJ SOLN
INTRAMUSCULAR | Status: DC | PRN
  Administered 2017-07-17: 25 ug via INTRAVENOUS

## 2017-07-17 MED ORDER — POTASSIUM CHLORIDE CRYS ER 20 MEQ PO TBCR
40.0000 meq | EXTENDED_RELEASE_TABLET | Freq: Two times a day (BID) | ORAL | Status: AC
  Administered 2017-07-17 (×2): 40 meq via ORAL
  Filled 2017-07-17 (×2): qty 2

## 2017-07-17 MED ORDER — VERAPAMIL HCL 2.5 MG/ML IV SOLN
INTRAVENOUS | Status: AC
  Filled 2017-07-17: qty 2

## 2017-07-17 MED ORDER — INSULIN ASPART 100 UNIT/ML ~~LOC~~ SOLN: 0.0000 [IU] | Freq: Every day | SUBCUTANEOUS | Status: DC

## 2017-07-17 MED ORDER — HEPARIN (PORCINE) IN NACL 100-0.45 UNIT/ML-% IJ SOLN
1000.0000 [IU]/h | INTRAMUSCULAR | Status: DC
  Administered 2017-07-17: 1000 [IU]/h via INTRAVENOUS
  Filled 2017-07-17: qty 250

## 2017-07-17 MED ORDER — MIDAZOLAM HCL 2 MG/2ML IJ SOLN
INTRAMUSCULAR | Status: DC | PRN
  Administered 2017-07-17: 1 mg via INTRAVENOUS

## 2017-07-17 MED ORDER — IOPAMIDOL (ISOVUE-370) INJECTION 76%
INTRAVENOUS | Status: AC
  Filled 2017-07-17: qty 125

## 2017-07-17 MED ORDER — HEPARIN (PORCINE) IN NACL 2-0.9 UNIT/ML-% IJ SOLN
INTRAMUSCULAR | Status: AC
  Filled 2017-07-17: qty 500

## 2017-07-17 MED ORDER — LIDOCAINE HCL (PF) 1 % IJ SOLN
INTRAMUSCULAR | Status: DC | PRN
  Administered 2017-07-17: 2 mL via SUBCUTANEOUS

## 2017-07-17 MED ORDER — INSULIN ASPART 100 UNIT/ML ~~LOC~~ SOLN: 0.0000 [IU] | Freq: Three times a day (TID) | SUBCUTANEOUS | Status: DC

## 2017-07-17 MED ORDER — LIDOCAINE HCL (PF) 1 % IJ SOLN
INTRAMUSCULAR | Status: AC
  Filled 2017-07-17: qty 30

## 2017-07-17 MED ORDER — ROSUVASTATIN CALCIUM 20 MG PO TABS
40.0000 mg | ORAL_TABLET | Freq: Every day | ORAL | Status: DC
  Administered 2017-07-17: 40 mg via ORAL
  Filled 2017-07-17: qty 2

## 2017-07-17 MED ORDER — SODIUM CHLORIDE 0.9 % WEIGHT BASED INFUSION
1.0000 mL/kg/h | INTRAVENOUS | Status: DC
Start: 2017-07-18 — End: 2017-07-17

## 2017-07-17 MED ORDER — ASPIRIN 81 MG PO CHEW
81.0000 mg | CHEWABLE_TABLET | Freq: Every day | ORAL | Status: DC
  Administered 2017-07-18: 81 mg via ORAL
  Filled 2017-07-17: qty 1

## 2017-07-17 MED ORDER — SODIUM CHLORIDE 0.9 % IV SOLN: 250.0000 mL | INTRAVENOUS | Status: DC | PRN

## 2017-07-17 MED ORDER — HEPARIN (PORCINE) IN NACL 2-0.9 UNIT/ML-% IJ SOLN
INTRAMUSCULAR | Status: AC | PRN
  Administered 2017-07-17: 1000 mL

## 2017-07-17 MED ORDER — ASPIRIN 81 MG PO CHEW
81.0000 mg | CHEWABLE_TABLET | ORAL | Status: AC
  Administered 2017-07-17: 81 mg via ORAL
  Filled 2017-07-17: qty 1

## 2017-07-17 MED ORDER — SODIUM CHLORIDE 0.9 % WEIGHT BASED INFUSION: 1.0000 mL/kg/h | INTRAVENOUS | Status: DC

## 2017-07-17 SURGICAL SUPPLY — 12 items
CATH 5FR JL3.5 JR4 ANG PIG MP (CATHETERS) ×2 IMPLANT
COVER PRB 48X5XTLSCP FOLD TPE (BAG) ×1 IMPLANT
COVER PROBE 5X48 (BAG) ×1
DEVICE RAD COMP TR BAND LRG (VASCULAR PRODUCTS) ×2 IMPLANT
GLIDESHEATH SLEND SS 6F .021 (SHEATH) ×2 IMPLANT
GUIDEWIRE INQWIRE 1.5J.035X260 (WIRE) ×1 IMPLANT
INQWIRE 1.5J .035X260CM (WIRE) ×2
KIT HEART LEFT (KITS) ×2 IMPLANT
PACK CARDIAC CATHETERIZATION (CUSTOM PROCEDURE TRAY) ×2 IMPLANT
SYR MEDRAD MARK V 150ML (SYRINGE) ×2 IMPLANT
TRANSDUCER W/STOPCOCK (MISCELLANEOUS) ×2 IMPLANT
TUBING CIL FLEX 10 FLL-RA (TUBING) ×2 IMPLANT

## 2017-07-17 NOTE — Progress Notes (Signed)
  PROGRESS NOTE  [name] SWF:[id number] DOB: 05/02/1961 DOA: 07/16/2017 PCP: [name], NP  Brief Narrative: 56yow developed CP and vomiting s/p outpt EGD with dilation and thus sent to ED. Admitted for chest pain evaluation with positive tropnin.  Assessment/Plan Chest pain s/p EGD with dilation of distal esophageal stricture, elevated troponin. CT chest negative for pneumomediastinum, EKG nonacute. LHC showed focal WMA, single vessel CAD 50%. Cardiologist rec medical management. Possible stress-induced or coronary vasospasm. - troponin peaked at .41, now .10 - still has 5/10 pain - echo pending  Hyperlipidemia  - LDL 198  Essential HTN - stable  Normocytic anemia - stable  Chronic prolonged QT  Aortic atherosclerosis     Further recs per GI and cardiology  Likely home 1/23 pending above, echo  DVT prophylaxis: heparin Code Status: full Family Communication: multiple family members at bedside Disposition Plan: home    Murray Hodgkins, MD  [facility] Direct contact: [phone] --[place] [place]  --[email or web address]; password [email or web address]  7PM-7AM contact night coverage as above 07/17/2017, 5:27 PM  LOS: 0 days   Consultants:  Cardiology   Procedures:   LEFT HEART CATH AND CORONARY ANGIOGRAPHY  Conclusion     Ost RPDA to RPDA lesion is 75% stenosed.  Prox RCA to Mid RCA lesion is 25% stenosed.  Ost Ramus to Ramus lesion is 50% stenosed.  The left ventricular systolic function is normal.  LV end diastolic pressure is normal.  The left ventricular ejection fraction is 50-55% by visual estimate.   1. Single vessel obstructive CAD with 75% mid PDA- this is a small vessel. 2. Focal wall motion abnormality involving the basal to mid anterior wall. Overall EF 50% 3. Normal LVEDP.  Impression: No significant CAD to explain clinical findings and wall motion abnormality of the anterior wall. Consideration for possible coronary spasm or  atypical stress induced cardiomyopathy. Recommend medical management.      Antimicrobials:    Interval history/Subjective: Feels better, tolerating liquids, but chest pain still 5/10.  Objective: Vitals:  Vitals:   07/17/17 1245 07/17/17 1401  BP: 137/67 125/74  Pulse: 76 99  Resp: 17 18  Temp:  98.5 F (36.9 C)  SpO2: 100% 99%    Exam:  Constitutional:  . Appears calm and comfortable Respiratory:  . CTA bilaterally, no w/r/r.  . Respiratory effort normal.  Cardiovascular:  . RRR, no m/r/g . No LE extremity edema   Psychiatric:  . Mental status o Mood, affect appropriate  I have personally reviewed the following:   Labs:  BMP unremarkable  Mg 1.6  LFTs unremarkable  Troponin down to .13  LDL 198  Hgb stable 10.1   Scheduled Meds: . [START ON 07/18/2017] aspirin  81 mg Oral Daily  . heparin  5,000 Units Subcutaneous Q8H  . insulin aspart  0-5 Units Subcutaneous QHS  . insulin aspart  0-9 Units Subcutaneous TID WC  . pantoprazole (PROTONIX) IV  40 mg Intravenous Q12H  . potassium chloride  40 mEq Oral BID  . rosuvastatin  40 mg Oral q1800  . sodium chloride flush  3 mL Intravenous Q12H   Continuous Infusions: . sodium chloride      Principal Problem:   Chest pain Active Problems:   Hyperlipidemia   Iron deficiency anemia due to chronic blood loss   Hyperthyroidism   HTN (hypertension)   LOS: 0 days

## 2017-07-17 NOTE — Interval H&P Note (Signed)
History and Physical Interval Note:  07/17/2017 10:26 AM  [name]  has presented today for surgery, with the diagnosis of Nstemi  The various methods of treatment have been discussed with the patient and family. After consideration of risks, benefits and other options for treatment, the patient has consented to  Procedure(s): LEFT HEART CATH AND CORONARY ANGIOGRAPHY (N/A) as a surgical intervention .  The patient's history has been reviewed, patient examined, no change in status, stable for surgery.  I have reviewed the patient's chart and labs.  Questions were answered to the patient's satisfaction.    Cath Lab Visit (complete for each Cath Lab visit)  Clinical Evaluation Leading to the Procedure:   ACS: Yes.    Non-ACS:    Anginal Classification: CCS IV  Anti-ischemic medical therapy: No Therapy  Non-Invasive Test Results: No non-invasive testing performed  Prior CABG: No previous CABG       [name] [address] [facility] 07/17/2017 10:26 AM

## 2017-07-17 NOTE — H&P (View-Only) (Signed)
Progress Note   Subjective  Patient feels better this AM. States discomfort is much better than yesterday, but has it fluctuating around 5/10 (yesterday 10/10). She states she had a gingerale in the ED and on the ward last night - denied any chest pain with ingestion, no odynophagia. Troponins noted to be mildly elevated and rising.    Objective   Vital signs in last 24 hours: Temp:  [97.4 F (36.3 C)-98.5 F (36.9 C)] 97.4 F (36.3 C) (01/23 0412) Pulse Rate:  [57-93] 57 (01/23 0412) Resp:  [10-28] 17 (01/23 0412) BP: (117-156)/(64-109) 129/85 (01/23 0412) SpO2:  [98 %-100 %] 98 % (01/23 0412) Weight:  [223 lb 5.2 oz (101.3 kg)] 223 lb 5.2 oz (101.3 kg) (01/23 0108) Last BM Date: 07/16/17 General:    AA female in NAD Heart:  Regular rate and rhythm; no murmurs Lungs: Respirations even and unlabored, lungs CTA bilaterally Abdomen:  Soft, nontender and nondistended.  Neurologic:  Alert and oriented,  grossly normal neurologically. Psych:  Cooperative. Normal mood and affect.  Intake/Output from previous day: 01/22 0701 - 01/23 0700 In: 321.3 [I.V.:321.3] Out: -  Intake/Output this shift: No intake/output data recorded.  Lab Results: Recent Labs    07/16/17 1746 07/17/17 0410  WBC 10.3 9.8  HGB 11.5* 9.9*  HCT 37.2 32.5*  PLT 249 254   BMET Recent Labs    07/16/17 1746 07/17/17 0410  NA 140 138  K 2.9* 3.4*  CL 108 106  CO2 24 25  GLUCOSE 143* 117*  BUN 10 10  CREATININE 0.73 0.79  CALCIUM 8.8* 8.0*   LFT Recent Labs    07/17/17 0123  PROT 7.2  ALBUMIN 3.4*  AST 35  ALT 22  ALKPHOS 111  BILITOT 0.4  BILIDIR <0.1*  IBILI NOT CALCULATED   PT/INR No results for input(s): LABPROT, INR in the last 72 hours.  Studies/Results: Dg Chest 2 View  Result Date: 07/16/2017 CLINICAL DATA:  Chest pain.  Status post endoscopy. EXAM: CHEST  2 VIEW COMPARISON:  05/25/2009 FINDINGS: The heart size and mediastinal contours are within normal limits. No  evidence of pneumomediastinum. Lung volumes are low bilaterally. There is no evidence of pulmonary edema, consolidation, pneumothorax, nodule or pleural fluid. The visualized skeletal structures are unremarkable. IMPRESSION: No acute findings in the chest. Electronically Signed   By: Aletta Edouard M.D.   On: 07/16/2017 16:42   Ct Chest Wo Contrast  Result Date: 07/16/2017 CLINICAL DATA:  Chest pain following esophageal dilatation EXAM: CT CHEST WITHOUT CONTRAST TECHNIQUE: Multidetector CT imaging of the chest was performed following the standard protocol without IV contrast. COMPARISON:  Chest CT May 06, 2007; chest radiograph July 16, 2017 FINDINGS: Cardiovascular: No thoracic aortic aneurysm is appreciable. Visualized great vessels appear unremarkable. Right and left common carotid arteries arise as a common trunk, an anatomic variant. There is aortic atherosclerosis. There are foci coronary artery calcification. There is no pericardial effusion or pericardial thickening. Mediastinum/Nodes: Thyroid appears unremarkable. There is no appreciable thoracic adenopathy. There is air in the esophagus to the level of the distal esophagus approximately 6 cm from the gastroesophageal junction. The appearance suggests that there may be edema in the wall of the esophagus in this area. There is no periesophageal soft tissue air or pneumomediastinum. Lungs/Pleura: There is bibasilar atelectatic change. There is no parenchymal lung edema or consolidation. No pneumothorax evident. No pleural effusion or pleural thickening. Upper Abdomen: Gallbladder is absent. Visualized upper abdominal structures otherwise  appear unremarkable. Musculoskeletal: No blastic or lytic bone lesions are evident. IMPRESSION: 1. There is wall thickening with probable edema in the distal esophagus. There is air in the esophagus proximal to this area of wall thickening distally. No esophageal disruption or periesophageal air seen on this  noncontrast enhanced study. There is no pneumomediastinum or pneumothorax. 2.  No lung edema or consolidation.  Mild bibasilar atelectasis. 3.  No appreciable adenopathy. 4. Foci of aortic atherosclerosis and coronary artery calcification. 5.  Gallbladder absent. Aortic Atherosclerosis (ICD10-I70.0). Electronically Signed   By: Lowella Grip III M.D.   On: 07/16/2017 18:22       Assessment / Plan:   57 y/o female who had an EGD yesterday for dysphagia showing a mild distal esophageal stricture, treated with balloon dilation, who experienced chest pain post-procedure. There was no mucosal wrent or obvious trauma to the esophagus observed endoscopically post dilation. CXR and CT chest have not shown any evidence of perforation. Some thickening of the distal esophagus noted on CT - unclear chronicity of that finding and if reactive to dilation or not. Troponins have been mildly elevated and rising since admission while EKG did not show any obvious ischemic changes.  Unclear why she had so much discomfort if related to the dilation without any clear evidence of esophageal injury. Cardiology will see the patient given her rise in troponins. Overall she feels improved since yesterday. I think trial of clear liquids today is reasonable pending her cardiac evaluation (if no procedures that require her to be NPO today). If any odynophagia or worsening chest pain with this, would obtain gastrograffin esophogram to ensure okay.   I will check on her later today, please call in the interim with questions.   Brookneal Cellar, MD [facility] Pager [phone]

## 2017-07-17 NOTE — Telephone Encounter (Signed)
Patient was admitted to the ED/hospital following procedure yesterday due to chest pain. Did not attempt follow up call. SM

## 2017-07-17 NOTE — ED Notes (Signed)
ED TO INPATIENT HANDOFF REPORT  Name/Age/Gender [name] 57 y.o. female  Code Status    Code Status Orders  (From admission, onward)        Start     Ordered   07/16/17 2222  Full code  Continuous     07/16/17 2224    Code Status History    Date Active Date Inactive Code Status Order ID Comments User Context   This patient has a current code status but no historical code status.    Advance Directive Documentation     Most Recent Value  Type of Advance Directive  Healthcare Power of Attorney, Living will  Pre-existing out of facility DNR order (yellow form or pink MOST form)  No data  "MOST" Form in Place?  No data      Home/SNF/Other Home  Chief Complaint Chest Pressure  Level of Care/Admitting Diagnosis ED Disposition    ED Disposition Condition Comment   Admit  Hospital Area: [facility] [[id number]]  Level of Care: Telemetry [5]  Admit to tele based on following criteria: Monitor for Ischemic changes  Diagnosis: Chest pain [470962]  Admitting Physician: Rise Patience [phone]  Attending Physician: Rise Patience [phone]  PT Class (Do Not Modify): Observation [104]  PT Acc Code (Do Not Modify): Observation [10022]       Medical History Past Medical History:  Diagnosis Date  . Allergy    allergic rhinitis  . Arthritis   . Atypical chest pain   . Cancer [facility])    having a biopsy on left breast this friday for abnormal finding  . Constipation   . Cyst, ovarian   . Diabetes mellitus, type II ([place])   . Endometriosis   . Esophageal stricture   . Gastroparesis   . GERD (gastroesophageal reflux disease)   . Heart murmur   . Hyperlipidemia   . Hypertension   . 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 Iron deficiency anemia   . Migraine headache   . Non-compliance   . Peptic ulcer disease   . Thyroid disease     Allergies Allergies  Allergen Reactions  . Ace  Inhibitors     REACTION: chronic cough  . Celebrex [Celecoxib] Other (See Comments)    "makes me bleed"  . Diovan [Valsartan]     angioedema    IV Location/Drains/Wounds Patient Lines/Drains/Airways Status   Active Line/Drains/Airways    Name:   Placement date:   Placement time:   Site:   Days:   Peripheral IV 07/16/17 Right Antecubital   07/16/17    1338    Antecubital   1   Peripheral IV 07/16/17 Right Antecubital   07/16/17    1755    Antecubital   1          Labs/Imaging Results for orders placed or performed during the hospital encounter of 07/16/17 (from the past 48 hour(s))  CBC     Status: Abnormal   Collection Time: 07/16/17  5:46 PM  Result Value Ref Range   WBC 10.3 4.0 - 10.5 K/uL   RBC 5.02 3.87 - 5.11 MIL/uL   Hemoglobin 11.5 (L) 12.0 - 15.0 g/dL   HCT 37.2 36.0 - 46.0 %   MCV 74.1 (L) 78.0 - 100.0 fL   MCH 22.9 (L) 26.0 - 34.0 pg   MCHC 30.9 30.0 - 36.0 g/dL   RDW 13.9 11.5 - 15.5 %  Platelets 249 150 - 400 K/uL  Comprehensive metabolic panel     Status: Abnormal   Collection Time: 07/16/17  5:46 PM  Result Value Ref Range   Sodium 140 135 - 145 mmol/L   Potassium 2.9 (L) 3.5 - 5.1 mmol/L   Chloride 108 101 - 111 mmol/L   CO2 24 22 - 32 mmol/L   Glucose, Bld 143 (H) 65 - 99 mg/dL   BUN 10 6 - 20 mg/dL   Creatinine, Ser 0.73 0.44 - 1.00 mg/dL   Calcium 8.8 (L) 8.9 - 10.3 mg/dL   Total Protein 7.9 6.5 - 8.1 g/dL   Albumin 3.8 3.5 - 5.0 g/dL   AST 28 15 - 41 U/L   ALT 26 14 - 54 U/L   Alkaline Phosphatase 125 38 - 126 U/L   Total Bilirubin 0.3 0.3 - 1.2 mg/dL   GFR calc non Af Amer >60 >60 mL/min   GFR calc Af Amer >60 >60 mL/min    Comment: (NOTE) The eGFR has been calculated using the CKD EPI equation. This calculation has not been validated in all clinical situations. eGFR's persistently <60 mL/min signify possible Chronic Kidney Disease.    Anion gap 8 5 - 15  Lipase, blood     Status: None   Collection Time: 07/16/17  5:46 PM  Result  Value Ref Range   Lipase 26 11 - 51 U/L  I-stat troponin, ED     Status: Abnormal   Collection Time: 07/16/17  6:22 PM  Result Value Ref Range   Troponin i, poc 0.11 (HH) 0.00 - 0.08 ng/mL   Comment NOTIFIED PHYSICIAN    Comment 3            Comment: Due to the release kinetics of cTnI, a negative result within the first hours of the onset of symptoms does not rule out myocardial infarction with certainty. If myocardial infarction is still suspected, repeat the test at appropriate intervals.   I-stat troponin, ED     Status: Abnormal   Collection Time: 07/16/17  9:55 PM  Result Value Ref Range   Troponin i, poc 0.31 (HH) 0.00 - 0.08 ng/mL   Comment NOTIFIED PHYSICIAN    Comment 3            Comment: Due to the release kinetics of cTnI, a negative result within the first hours of the onset of symptoms does not rule out myocardial infarction with certainty. If myocardial infarction is still suspected, repeat the test at appropriate intervals.   Blood gas, venous     Status: None (Preliminary result)   Collection Time: 07/16/17 10:45 PM  Result Value Ref Range   pH, Ven 7.357 7.250 - 7.430   pCO2, Ven 45.9 44.0 - 60.0 mmHg   pO2, Ven PENDING 32.0 - 45.0 mmHg   Bicarbonate 25.1 20.0 - 28.0 mmol/L   Acid-base deficit 0.1 0.0 - 2.0 mmol/L   O2 Saturation 47.6 %   Patient temperature 98.6    Dg Chest 2 View  Result Date: 07/16/2017 CLINICAL DATA:  Chest pain.  Status post endoscopy. EXAM: CHEST  2 VIEW COMPARISON:  05/25/2009 FINDINGS: The heart size and mediastinal contours are within normal limits. No evidence of pneumomediastinum. Lung volumes are low bilaterally. There is no evidence of pulmonary edema, consolidation, pneumothorax, nodule or pleural fluid. The visualized skeletal structures are unremarkable. IMPRESSION: No acute findings in the chest. Electronically Signed   By: Aletta Edouard M.D.   On: 07/16/2017 16:42  Ct Chest Wo Contrast  Result Date:  07/16/2017 CLINICAL DATA:  Chest pain following esophageal dilatation EXAM: CT CHEST WITHOUT CONTRAST TECHNIQUE: Multidetector CT imaging of the chest was performed following the standard protocol without IV contrast. COMPARISON:  Chest CT May 06, 2007; chest radiograph July 16, 2017 FINDINGS: Cardiovascular: No thoracic aortic aneurysm is appreciable. Visualized great vessels appear unremarkable. Right and left common carotid arteries arise as a common trunk, an anatomic variant. There is aortic atherosclerosis. There are foci coronary artery calcification. There is no pericardial effusion or pericardial thickening. Mediastinum/Nodes: Thyroid appears unremarkable. There is no appreciable thoracic adenopathy. There is air in the esophagus to the level of the distal esophagus approximately 6 cm from the gastroesophageal junction. The appearance suggests that there may be edema in the wall of the esophagus in this area. There is no periesophageal soft tissue air or pneumomediastinum. Lungs/Pleura: There is bibasilar atelectatic change. There is no parenchymal lung edema or consolidation. No pneumothorax evident. No pleural effusion or pleural thickening. Upper Abdomen: Gallbladder is absent. Visualized upper abdominal structures otherwise appear unremarkable. Musculoskeletal: No blastic or lytic bone lesions are evident. IMPRESSION: 1. There is wall thickening with probable edema in the distal esophagus. There is air in the esophagus proximal to this area of wall thickening distally. No esophageal disruption or periesophageal air seen on this noncontrast enhanced study. There is no pneumomediastinum or pneumothorax. 2.  No lung edema or consolidation.  Mild bibasilar atelectasis. 3.  No appreciable adenopathy. 4. Foci of aortic atherosclerosis and coronary artery calcification. 5.  Gallbladder absent. Aortic Atherosclerosis (ICD10-I70.0). Electronically Signed   By: Lowella Grip III M.D.   On: 07/16/2017  18:22    Pending Labs Unresulted Labs (From admission, onward)   Start     Ordered   07/17/17 2778  Basic metabolic panel  Tomorrow morning,   R     07/16/17 2224   07/17/17 0500  CBC  Tomorrow morning,   R     07/16/17 2224   07/16/17 2223  Troponin I  Now then every 6 hours,   R     07/16/17 2224   07/16/17 2222  Hepatic function panel  Once,   R     07/16/17 2224   07/16/17 2222  Magnesium  Once,   R     07/16/17 2224   07/16/17 2221  HIV antibody (Routine Testing)  Once,   R     07/16/17 2224      Vitals/Pain Today's Vitals   07/16/17 2330 07/16/17 2348 07/17/17 0000 07/17/17 0030  BP: 133/79 133/79 121/78 135/81  Pulse: 93 69 66 63  Resp: 15 19 16 14   Temp:  98.3 F (36.8 C)    TempSrc:  Oral    SpO2: 100% 100% 100% 99%  PainSc:  0-No pain      Isolation Precautions No active isolations  Medications Medications  acetaminophen (TYLENOL) tablet 650 mg (not administered)    Or  acetaminophen (TYLENOL) suppository 650 mg (not administered)  ondansetron (ZOFRAN) tablet 4 mg (not administered)    Or  ondansetron (ZOFRAN) injection 4 mg (not administered)  morphine 2 MG/ML injection 1 mg (not administered)  hydrALAZINE (APRESOLINE) injection 10 mg (not administered)  pantoprazole (PROTONIX) injection 40 mg (40 mg Intravenous Given 07/16/17 2352)  potassium chloride 10 mEq in 100 mL IVPB (10 mEq Intravenous Transfusing/Transfer 07/17/17 0058)  dextrose 5 % and 0.9 % NaCl with KCl 20 mEq/L infusion (not administered)  morphine 4 MG/ML  injection 4 mg (4 mg Intravenous Given 07/16/17 1815)  sodium chloride 0.9 % bolus 1,000 mL (1,000 mLs Intravenous New Bag/Given 07/16/17 1856)  morphine 4 MG/ML injection 4 mg (4 mg Intravenous Given 07/16/17 1856)  ondansetron (ZOFRAN) injection 4 mg (4 mg Intravenous Given 07/16/17 1856)  sodium chloride 0.9 % bolus 1,000 mL (1,000 mLs Intravenous New Bag/Given 07/16/17 2014)  aspirin chewable tablet 324 mg (324 mg Oral Given 07/16/17 2012)     Mobility walks

## 2017-07-17 NOTE — Progress Notes (Signed)
Patient transferred to [facility] for Cardiac Cath with anticipation of returning to [facility]. Patient aware and in agreement. Consent signed. Heparin gtt initiated and bolus given- per pharmacist, OK to give prior to baseline labs drawn d/t rush of [facility] and situation. Family at bedside and aware of transfer.

## 2017-07-17 NOTE — Progress Notes (Signed)
ANTICOAGULATION CONSULT NOTE - Initial Consult  Pharmacy Consult for IV heparin Indication: chest pain/ACS  Allergies  Allergen Reactions  . Ace Inhibitors     REACTION: chronic cough  . Celebrex [Celecoxib] Other (See Comments)    "makes me bleed"  . Diovan [Valsartan]     angioedema    Patient Measurements: Height: 5\' 4"  (162.6 cm) Weight: 223 lb 5.2 oz (101.3 kg) IBW/kg (Calculated) : 54.7 Heparin Dosing Weight: 78.3 kg  Vital Signs: Temp: 97.4 F (36.3 C) (01/23 0412) Temp Source: Oral (01/23 0412) BP: 129/85 (01/23 0412) Pulse Rate: 57 (01/23 0412)  Labs: Recent Labs    07/16/17 1746 07/17/17 0410  HGB 11.5* 9.9*  HCT 37.2 32.5*  PLT 249 254  CREATININE 0.73 0.79  TROPONINI  --  0.41*    Estimated Creatinine Clearance: 88.7 mL/min (by C-G formula based on SCr of 0.79 mg/dL).   Medical History: Past Medical History:  Diagnosis Date  . Allergy    allergic rhinitis  . Arthritis   . Atypical chest pain   . Cancer [facility])    having a biopsy on left breast this friday for abnormal finding  . Constipation   . Cyst, ovarian   . Diabetes mellitus, type II ([place])   . Endometriosis   . Esophageal stricture   . Gastroparesis   . GERD (gastroesophageal reflux disease)   . Heart murmur   . Hyperlipidemia   . Hypertension   . 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 Iron deficiency anemia   . Migraine headache   . Non-compliance   . Peptic ulcer disease   . Thyroid disease     Assessment: 42 y/oF with PMH of HTN, GERD, gastroparesis, PUD, iron deficiency anemia, HLD, DM who underwent EGD and colonoscopy on 1/22 after which she developed substernal chest pain. Troponin trending up. Cardiology consulted. Pharmacy asked to start heparin infusion for NSTEMI/unstable angina. CBC reveals Hgb 9.9, Pltc WNL. SCr WNL. No anticoagulants PTA. Plan for cardiac cath today.   Goal of Therapy:  Heparin level 0.3-0.7  units/ml Monitor platelets by anticoagulation protocol: Yes   Plan:   Baseline PT/INR, aPTT  Heparin 4000 units bolus IV x 1 STAT, then heparin infusion at 1000 units/hr.   F/u plans post-cath.    Lindell Spar, PharmD, BCPS Pager: [phone] 07/17/2017 9:22 AM

## 2017-07-17 NOTE — Consult Note (Signed)
Cardiology Consultation:   Patient ID: [name]; [id number]; [birth year]   Admit date: 07/16/2017 Date of Consult: 07/17/2017  Primary Care Provider: [name], NP Primary Cardiologist: New to [facility] (previously seen by Dr. Angelena Form in 2009-2010)   Patient Profile:   [name] is a 57 y.o. female with a hx of hypertension, gastroesophageal reflux disease, gastroparesis, H. pylori, peptic ulcer disease, microscopic anemia, hyperlipidemia, and diabetes mellitus  who is being seen today for the evaluation of elevated troponin at the request of Dr.  Sarajane Jews.   Cath 01/2008 for atypical chest pain (GI related) showed minor plaque in the LAD with no disease in the RCA or Circumflex (detailed report below). Her LV function was normal. Advised to follow up PRN.   History of Present Illness:   [name] was in usual state of health up until yesterday when she had a substernal chest pain after endoscopy and colonoscopy for evaluation of dysphagia and hematochezia. IT showed mild distal esophageal stricture, treated with balloon dilation. After the procedure, patient had severe substernal chest pain.  She described the pain as "elephant sitting ".  It was 10 out of 10.  Associated with some shortness of breath and radiation to left shoulder.  She also had multiple episodes of vomiting afterwards. and CT chest have not shown any evidence of perforation. Some thickening of the distal esophagus noted on CT - unclear chronicity of that finding and if reactive to dilation or not.  Her pain lasted for approximately 1-2 hours.  Improved after morphine and Zofran in the ER.  He did not receive any sublingual nitroglycerin.  Currently her pressure is 5/10.  Point of care troponin 0.11 --> 0.31.  Then troponin I 0.41.  Her potassium was 2.3 now improved to 3.4 after supplementation.  Hemoglobin 9.9.  EKG shows sinus rhythm at rate of 89 bpm-personally reviewed.  T wave inversion in anterior  lateral leads are resolved compared to prior EKG in February 2016.  The patient denies tobacco smoking or illicit drug use.  Social drinker.  Her pain is different when she had a cath in 2009.  Pain yesterday was different from typical acid reflux.  Father had a history of CAD.  She used to exercise 5 days/week without any symptoms. She hasn't been to GYM in PAST 6 MONTHS. She denies orthopnea, PND , palpitation, dizziness, syncope or melena.    Past Medical History:  Diagnosis Date  . Allergy    allergic rhinitis  . Arthritis   . Atypical chest pain   . Cancer [facility])    having a biopsy on left breast this friday for abnormal finding  . Constipation   . Cyst, ovarian   . Diabetes mellitus, type II ([place])   . Endometriosis   . Esophageal stricture   . Gastroparesis   . GERD (gastroesophageal reflux disease)   . Heart murmur   . Hyperlipidemia   . Hypertension   . 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 Iron deficiency anemia   . Migraine headache   . Non-compliance   . Peptic ulcer disease   . Thyroid disease     Past Surgical History:  Procedure Laterality Date  . ABDOMINAL EXPLORATION SURGERY     w/bso   . CARDIAC CATHETERIZATION  2009   mild non obstructive CAD  . CHOLECYSTECTOMY    . KNEE SURGERY  2005    left knee  . TOTAL ABDOMINAL HYSTERECTOMY  Inpatient Medications: Scheduled Meds: . pantoprazole (PROTONIX) IV  40 mg Intravenous Q12H   Continuous Infusions: . dextrose 5 % and 0.9 % NaCl with KCl 20 mEq/L 75 mL/hr at 07/17/17 0143   PRN Meds: acetaminophen **OR** acetaminophen, hydrALAZINE, morphine injection, ondansetron **OR** ondansetron (ZOFRAN) IV  Allergies:    Allergies  Allergen Reactions  . Ace Inhibitors     REACTION: chronic cough  . Celebrex [Celecoxib] Other (See Comments)    "makes me bleed"  . Diovan [Valsartan]     angioedema    Social History:   Social History    Socioeconomic History  . Marital status: Widowed    Spouse name: Not on file  . Number of children: 2  . Years of education: Not on file  . Highest education level: Not on file  Social Needs  . Financial resource strain: Not on file  . Food insecurity - worry: Not on file  . Food insecurity - inability: Not on file  . Transportation needs - medical: Not on file  . Transportation needs - non-medical: Not on file  Occupational History  . Occupation: DISABILITY    Employer: UNEMPLOYED  Tobacco Use  . Smoking status: Former Smoker    Packs/day: 0.50    Years: 18.00    Pack years: 9.00    Types: Cigarettes    Start date: 07/29/1977    Last attempt to quit: 06/25/1994    Years since quitting: 23.0  . Smokeless tobacco: Never Used  . Tobacco comment: quit 19 years ago  Substance and Sexual Activity  . Alcohol use: Yes    Alcohol/week: 0.0 oz    Comment: social drinker  . Drug use: No  . Sexual activity: Not Currently  Other Topics Concern  . Not on file  Social History Narrative   Married    Has 2 grown children.  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   Former Smoker Quit tobacco in 1996.  She was a pack a day smoker for approximately 10 years.   Alcohol use-yes: Social    Daily Caffeine Use:6 pack of pepsi daily     Illicit Drug Use - no    Patient does not get regular exercise.       Smoking Status:  quit    Family History:    Family History  Problem Relation Age of Onset  . Hypertension Mother   . Rheum arthritis Mother   . Hypertension Father   . Diabetes Father   . Prostate cancer Father   . Kidney disease Father   . Diabetes Sister   . Fibromyalgia Sister   . Diabetes Maternal Grandmother   . Lung cancer Maternal Grandfather   . Colon cancer Neg Hx   . Thyroid disease Neg Hx      ROS:  Please see the history of present illness.  ROS All other ROS reviewed and negative.     Physical Exam/Data:   Vitals:   07/17/17 0000 07/17/17 0030 07/17/17 0108  07/17/17 0412  BP: 121/78 135/81 (!) 141/81 129/85  Pulse: 66 63 61 (!) 57  Resp: 16 14 16 17   Temp:   98.1 F (36.7 C) (!) 97.4 F (36.3 C)  TempSrc:   Oral Oral  SpO2: 100% 99% 100% 98%  Weight:   223 lb 5.2 oz (101.3 kg)   Height:   5\' 4"  (1.626 m)     Intake/Output Summary (Last 24 hours) at 07/17/2017 0802 Last data filed at 07/17/2017 0600 Gross  per 24 hour  Intake 321.25 ml  Output -  Net 321.25 ml   Filed Weights   07/17/17 0108  Weight: 223 lb 5.2 oz (101.3 kg)   Body mass index is 38.33 kg/m.  General:  Well nourished, well developed, in no acute distress HEENT: normal Lymph: no adenopathy Neck: no JVD Endocrine:  No thryomegaly Vascular: No carotid bruits; FA pulses 2+ bilaterally without bruits  Cardiac:  normal S1, S2; RRR; no murmur .  Her pain is reproducible with palpation at mid left sternal area. Lungs:  clear to auscultation bilaterally, no wheezing, rhonchi or rales  Abd: soft, nontender, no hepatomegaly  Ext: no edema Musculoskeletal:  No deformities, BUE and BLE strength normal and equal Skin: warm and dry  Neuro:  CNs 2-12 intact, no focal abnormalities noted Psych:  Normal affect    Telemetry:  Telemetry was personally reviewed and demonstrates: Normal sinus rhythm at controlled ventricular rate  Relevant CV Studies: Cath 01/2008 FINDINGS:  1. The left main coronary artery is normal and gives rise to the      circumflex and the LAD.  2. The left anterior descending is a large vessel that gives rise to a      very large first diagonal that is free of disease.  There are      luminal irregularities noted in the mid LAD with stenoses ranging      between 20% and 30%.  3. Circumflex coronary artery gives rise to a large obtuse marginal      branch that bifurcates and is free of disease.  There are mild      luminal irregularities in the mid circumflex.  4. The right coronary artery is a dominant vessel that gives rise to      posterior  descending branch and a posterolateral branch.  There are      mild luminal irregularities noted in the mid right coronary artery      and the distal right coronary artery.  5. The left ventricle shows hyperdynamic systolic function with an      ejection fraction of 65%-70%.  Left ventricular pressure is 198/21      with an end-diastolic pressure of 27.  Aortic pressure during the      case is 193/101.   IMPRESSION:  1. Mild nonobstructive coronary artery disease.  2. Normal left ventricular function.  3. Uncontrolled hypertension.  4. Chest pain   RECOMMENDATIONS:  I think that many of the patient's symptoms are  probably related to her uncontrolled hypertension.  I will add  lisinopril 20 mg once daily today.  Because of her hypertension and also  her nonobstructive coronary artery disease, I am also going to add  Lopressor 25 mg twice daily and aspirin 81 mg once daily.  I will plan  on seeing her in my office in 3-4 weeks.  Laboratory Data:  Chemistry Recent Labs  Lab 07/16/17 1746 07/17/17 0410  NA 140 138  K 2.9* 3.4*  CL 108 106  CO2 24 25  GLUCOSE 143* 117*  BUN 10 10  CREATININE 0.73 0.79  CALCIUM 8.8* 8.0*  GFRNONAA >60 >60  GFRAA >60 >60  ANIONGAP 8 7    Recent Labs  Lab 07/16/17 1746 07/17/17 0123  PROT 7.9 7.2  ALBUMIN 3.8 3.4*  AST 28 35  ALT 26 22  ALKPHOS 125 111  BILITOT 0.3 0.4   Hematology Recent Labs  Lab 07/16/17 1746 07/17/17 0410  WBC 10.3 9.8  RBC 5.02 4.39  HGB 11.5* 9.9*  HCT 37.2 32.5*  MCV 74.1* 74.0*  MCH 22.9* 22.6*  MCHC 30.9 30.5  RDW 13.9 14.3  PLT 249 254   Cardiac Enzymes Recent Labs  Lab 07/17/17 0410  TROPONINI 0.41*    Recent Labs  Lab 07/16/17 1822 07/16/17 2155  TROPIPOC 0.11* 0.31*    BNPNo results for input(s): BNP, PROBNP in the last 168 hours.  DDimer No results for input(s): DDIMER in the last 168 hours.  Radiology/Studies:  Dg Chest 2 View  Result Date: 07/16/2017 CLINICAL DATA:  Chest  pain.  Status post endoscopy. EXAM: CHEST  2 VIEW COMPARISON:  05/25/2009 FINDINGS: The heart size and mediastinal contours are within normal limits. No evidence of pneumomediastinum. Lung volumes are low bilaterally. There is no evidence of pulmonary edema, consolidation, pneumothorax, nodule or pleural fluid. The visualized skeletal structures are unremarkable. IMPRESSION: No acute findings in the chest. Electronically Signed   By: Aletta Edouard M.D.   On: 07/16/2017 16:42   Ct Chest Wo Contrast  Result Date: 07/16/2017 CLINICAL DATA:  Chest pain following esophageal dilatation EXAM: CT CHEST WITHOUT CONTRAST TECHNIQUE: Multidetector CT imaging of the chest was performed following the standard protocol without IV contrast. COMPARISON:  Chest CT May 06, 2007; chest radiograph July 16, 2017 FINDINGS: Cardiovascular: No thoracic aortic aneurysm is appreciable. Visualized great vessels appear unremarkable. Right and left common carotid arteries arise as a common trunk, an anatomic variant. There is aortic atherosclerosis. There are foci coronary artery calcification. There is no pericardial effusion or pericardial thickening. Mediastinum/Nodes: Thyroid appears unremarkable. There is no appreciable thoracic adenopathy. There is air in the esophagus to the level of the distal esophagus approximately 6 cm from the gastroesophageal junction. The appearance suggests that there may be edema in the wall of the esophagus in this area. There is no periesophageal soft tissue air or pneumomediastinum. Lungs/Pleura: There is bibasilar atelectatic change. There is no parenchymal lung edema or consolidation. No pneumothorax evident. No pleural effusion or pleural thickening. Upper Abdomen: Gallbladder is absent. Visualized upper abdominal structures otherwise appear unremarkable. Musculoskeletal: No blastic or lytic bone lesions are evident. IMPRESSION: 1. There is wall thickening with probable edema in the distal  esophagus. There is air in the esophagus proximal to this area of wall thickening distally. No esophageal disruption or periesophageal air seen on this noncontrast enhanced study. There is no pneumomediastinum or pneumothorax. 2.  No lung edema or consolidation.  Mild bibasilar atelectasis. 3.  No appreciable adenopathy. 4. Foci of aortic atherosclerosis and coronary artery calcification. 5.  Gallbladder absent. Aortic Atherosclerosis (ICD10-I70.0). Electronically Signed   By: Lowella Grip III M.D.   On: 07/16/2017 18:22    Assessment and Plan:   1. NSTEMI/unstable angina - Her symptoms is concerning for angina however she does have some atypical features.  It occurred after esophageal dilatation and somewhat reproducible with palpation however less intense.  Her pain was 10/10 yesterday--> now 5/10. Her pain is different when she had a cath in 2009.  Pain yesterday is different from typical acid reflux. - Troponin trending up (0.11-->0.31-->0.41).  EKG without acute ischemic changes. - Her cardiac risk factor includes hypertension, hyperlipidemia and borderline diabetic.  - CT of chest showed foci of aortic atherosclerosis and coronary artery calcification. - She needs inpatient evaluation. Given symptoms will proceed with Cath. Stat heparin per pharmacy. Check lipid panel. Resume crestor. Add ASA 81mg  qd. Resume antihypertensive post cath.   The patient understands that  risks include but are not limited to stroke (1 in 1000), death (1 in 74), kidney failure [usually temporary] (1 in 500), bleeding (1 in 200), allergic reaction [possibly serious] (1 in 200), and agrees to proceed.   2.  Hypertension -Stable.  Home medication on hold  3.  Hypokalemia -Improving with supplementation. Will give additional Kdur 72meq x 2.   4.  Hyperlipidemia - 12/04/2016: Cholesterol 234; HDL 52.40; LDL Cholesterol 159; Triglycerides 113.0; VLDL 22.6  - Resume home Crestor 40mg  qd  For questions or  updates, please contact [place] Please consult [email or web address] for contact info under Cardiology/STEMI.   Jarrett Soho, [place]  07/17/2017 8:02 AM

## 2017-07-17 NOTE — Progress Notes (Addendum)
TR BAND REMOVAL  LOCATION:    Right radial  DEFLATED PER PROTOCOL:    Yes.    TIME BAND OFF / DRESSING APPLIED:    1300p and dry dressing applied with coban   SITE UPON ARRIVAL:    Level 0  SITE AFTER BAND REMOVAL:    Level 0  CIRCULATION SENSATION AND MOVEMENT:    Within Normal Limits   Yes.    COMMENTS:   VS stable . Pt denies any discomfort  And pulses present. Pt back to [facility] for monitoring until discharge.

## 2017-07-17 NOTE — Progress Notes (Signed)
Progress Note   Subjective  Patient feels better this AM. States discomfort is much better than yesterday, but has it fluctuating around 5/10 (yesterday 10/10). She states she had a gingerale in the ED and on the ward last night - denied any chest pain with ingestion, no odynophagia. Troponins noted to be mildly elevated and rising.    Objective   Vital signs in last 24 hours: Temp:  [97.4 F (36.3 C)-98.5 F (36.9 C)] 97.4 F (36.3 C) (01/23 0412) Pulse Rate:  [57-93] 57 (01/23 0412) Resp:  [10-28] 17 (01/23 0412) BP: (117-156)/(64-109) 129/85 (01/23 0412) SpO2:  [98 %-100 %] 98 % (01/23 0412) Weight:  [223 lb 5.2 oz (101.3 kg)] 223 lb 5.2 oz (101.3 kg) (01/23 0108) Last BM Date: 07/16/17 General:    AA female in NAD Heart:  Regular rate and rhythm; no murmurs Lungs: Respirations even and unlabored, lungs CTA bilaterally Abdomen:  Soft, nontender and nondistended.  Neurologic:  Alert and oriented,  grossly normal neurologically. Psych:  Cooperative. Normal mood and affect.  Intake/Output from previous day: 01/22 0701 - 01/23 0700 In: 321.3 [I.V.:321.3] Out: -  Intake/Output this shift: No intake/output data recorded.  Lab Results: Recent Labs    07/16/17 1746 07/17/17 0410  WBC 10.3 9.8  HGB 11.5* 9.9*  HCT 37.2 32.5*  PLT 249 254   BMET Recent Labs    07/16/17 1746 07/17/17 0410  NA 140 138  K 2.9* 3.4*  CL 108 106  CO2 24 25  GLUCOSE 143* 117*  BUN 10 10  CREATININE 0.73 0.79  CALCIUM 8.8* 8.0*   LFT Recent Labs    07/17/17 0123  PROT 7.2  ALBUMIN 3.4*  AST 35  ALT 22  ALKPHOS 111  BILITOT 0.4  BILIDIR <0.1*  IBILI NOT CALCULATED   PT/INR No results for input(s): LABPROT, INR in the last 72 hours.  Studies/Results: Dg Chest 2 View  Result Date: 07/16/2017 CLINICAL DATA:  Chest pain.  Status post endoscopy. EXAM: CHEST  2 VIEW COMPARISON:  05/25/2009 FINDINGS: The heart size and mediastinal contours are within normal limits. No  evidence of pneumomediastinum. Lung volumes are low bilaterally. There is no evidence of pulmonary edema, consolidation, pneumothorax, nodule or pleural fluid. The visualized skeletal structures are unremarkable. IMPRESSION: No acute findings in the chest. Electronically Signed   By: Aletta Edouard M.D.   On: 07/16/2017 16:42   Ct Chest Wo Contrast  Result Date: 07/16/2017 CLINICAL DATA:  Chest pain following esophageal dilatation EXAM: CT CHEST WITHOUT CONTRAST TECHNIQUE: Multidetector CT imaging of the chest was performed following the standard protocol without IV contrast. COMPARISON:  Chest CT May 06, 2007; chest radiograph July 16, 2017 FINDINGS: Cardiovascular: No thoracic aortic aneurysm is appreciable. Visualized great vessels appear unremarkable. Right and left common carotid arteries arise as a common trunk, an anatomic variant. There is aortic atherosclerosis. There are foci coronary artery calcification. There is no pericardial effusion or pericardial thickening. Mediastinum/Nodes: Thyroid appears unremarkable. There is no appreciable thoracic adenopathy. There is air in the esophagus to the level of the distal esophagus approximately 6 cm from the gastroesophageal junction. The appearance suggests that there may be edema in the wall of the esophagus in this area. There is no periesophageal soft tissue air or pneumomediastinum. Lungs/Pleura: There is bibasilar atelectatic change. There is no parenchymal lung edema or consolidation. No pneumothorax evident. No pleural effusion or pleural thickening. Upper Abdomen: Gallbladder is absent. Visualized upper abdominal structures otherwise  appear unremarkable. Musculoskeletal: No blastic or lytic bone lesions are evident. IMPRESSION: 1. There is wall thickening with probable edema in the distal esophagus. There is air in the esophagus proximal to this area of wall thickening distally. No esophageal disruption or periesophageal air seen on this  noncontrast enhanced study. There is no pneumomediastinum or pneumothorax. 2.  No lung edema or consolidation.  Mild bibasilar atelectasis. 3.  No appreciable adenopathy. 4. Foci of aortic atherosclerosis and coronary artery calcification. 5.  Gallbladder absent. Aortic Atherosclerosis (ICD10-I70.0). Electronically Signed   By: Lowella Grip III M.D.   On: 07/16/2017 18:22       Assessment / Plan:   57 y/o female who had an EGD yesterday for dysphagia showing a mild distal esophageal stricture, treated with balloon dilation, who experienced chest pain post-procedure. There was no mucosal wrent or obvious trauma to the esophagus observed endoscopically post dilation. CXR and CT chest have not shown any evidence of perforation. Some thickening of the distal esophagus noted on CT - unclear chronicity of that finding and if reactive to dilation or not. Troponins have been mildly elevated and rising since admission while EKG did not show any obvious ischemic changes.  Unclear why she had so much discomfort if related to the dilation without any clear evidence of esophageal injury. Cardiology will see the patient given her rise in troponins. Overall she feels improved since yesterday. I think trial of clear liquids today is reasonable pending her cardiac evaluation (if no procedures that require her to be NPO today). If any odynophagia or worsening chest pain with this, would obtain gastrograffin esophogram to ensure okay.   I will check on her later today, please call in the interim with questions.   Kellerton Cellar, MD [facility] Gastroenterology Pager [phone]

## 2017-07-18 ENCOUNTER — Ambulatory Visit: Payer: Medicare PPO

## 2017-07-18 ENCOUNTER — Encounter (HOSPITAL_COMMUNITY): Payer: Self-pay | Admitting: Cardiology

## 2017-07-18 ENCOUNTER — Telehealth: Payer: Self-pay | Admitting: Family

## 2017-07-18 ENCOUNTER — Observation Stay (HOSPITAL_BASED_OUTPATIENT_CLINIC_OR_DEPARTMENT_OTHER): Payer: Medicare PPO

## 2017-07-18 DIAGNOSIS — R079 Chest pain, unspecified: Secondary | ICD-10-CM | POA: Diagnosis not present

## 2017-07-18 DIAGNOSIS — R072 Precordial pain: Secondary | ICD-10-CM | POA: Diagnosis not present

## 2017-07-18 DIAGNOSIS — I10 Essential (primary) hypertension: Secondary | ICD-10-CM | POA: Diagnosis not present

## 2017-07-18 DIAGNOSIS — E785 Hyperlipidemia, unspecified: Secondary | ICD-10-CM | POA: Diagnosis not present

## 2017-07-18 DIAGNOSIS — I700 Atherosclerosis of aorta: Secondary | ICD-10-CM

## 2017-07-18 LAB — ECHOCARDIOGRAM COMPLETE
Height: 64 in
Weight: 3573.22 oz

## 2017-07-18 LAB — CBC
HCT: 32.6 % — ABNORMAL LOW (ref 36.0–46.0)
Hemoglobin: 10 g/dL — ABNORMAL LOW (ref 12.0–15.0)
MCH: 22.8 pg — ABNORMAL LOW (ref 26.0–34.0)
MCHC: 30.7 g/dL (ref 30.0–36.0)
MCV: 74.3 fL — ABNORMAL LOW (ref 78.0–100.0)
Platelets: 200 10*3/uL (ref 150–400)
RBC: 4.39 MIL/uL (ref 3.87–5.11)
RDW: 14.4 % (ref 11.5–15.5)
WBC: 6.9 10*3/uL (ref 4.0–10.5)

## 2017-07-18 LAB — BASIC METABOLIC PANEL
Anion gap: 4 — ABNORMAL LOW (ref 5–15)
BUN: 9 mg/dL (ref 6–20)
CO2: 24 mmol/L (ref 22–32)
Calcium: 8.3 mg/dL — ABNORMAL LOW (ref 8.9–10.3)
Chloride: 112 mmol/L — ABNORMAL HIGH (ref 101–111)
Creatinine, Ser: 0.77 mg/dL (ref 0.44–1.00)
GFR calc Af Amer: >60 mL/min (ref >60)
GFR calc non Af Amer: >60 mL/min (ref >60)
Glucose, Bld: 102 mg/dL — ABNORMAL HIGH (ref 65–99)
Potassium: 4 mmol/L (ref 3.5–5.1)
Sodium: 140 mmol/L (ref 135–145)

## 2017-07-18 LAB — GLUCOSE, CAPILLARY
Glucose-Capillary: 97 mg/dL (ref 65–99)
Glucose-Capillary: 97 mg/dL (ref 65–99)

## 2017-07-18 MED ORDER — PANTOPRAZOLE SODIUM 40 MG PO TBEC: 40.0000 mg | DELAYED_RELEASE_TABLET | Freq: Two times a day (BID) | ORAL | Status: DC

## 2017-07-18 MED ORDER — ASPIRIN 81 MG PO CHEW: 81.0000 mg | CHEWABLE_TABLET | Freq: Every day | ORAL | Status: AC

## 2017-07-18 NOTE — Care Management Obs Status (Signed)
[facility] NOTIFICATION   Patient Details  Name: [name] MRN: [id number] Date of Birth: 18-Jun-1961   Medicare Observation Status Notification Given:  Yes    Leeroy Cha, RN 07/18/2017, 10:49 AM

## 2017-07-18 NOTE — Telephone Encounter (Signed)
Needs lipid clinic referral per hospital discharge summary.

## 2017-07-18 NOTE — Progress Notes (Signed)
Progress Note   Subjective  Patient had cardiac cath yesterday morning. Her troponin peaked at 0.41 and then downtrended. She reports her pain continues to improve, rated at 3/10 this AM. No shortness of breath, no abdominal pains. She ate a hamburger and [place] sandwich yesterday without any worsening of her pain. No odynophagia.   Objective   Vital signs in last 24 hours: Temp:  [97.4 F (36.3 C)-98.5 F (36.9 C)] 97.9 F (36.6 C) (01/24 0448) Pulse Rate:  [0-99] 55 (01/24 0448) Resp:  [13-51] 18 (01/24 0448) BP: (125-150)/(67-97) 142/76 (01/24 0448) SpO2:  [0 %-100 %] 94 % (01/24 0448) Last BM Date: 07/16/17 General:    AA female in NAD Heart:  Regular rate and rhythm;  Lungs: Respirations even and unlabored Abdomen:  Soft, nontender and nondistended.  Neurologic:  Alert and oriented,  grossly normal neurologically. Psych:  Cooperative. Normal mood and affect.  Intake/Output from previous day: 01/23 0701 - 01/24 0700 In: 480 [P.O.:480] Out: 3 [Urine:3] Intake/Output this shift: No intake/output data recorded.  Lab Results: Recent Labs    07/17/17 0410 07/17/17 1507 07/18/17 0357  WBC 9.8 7.8 6.9  HGB 9.9* 10.1* 10.0*  HCT 32.5* 33.1* 32.6*  PLT 254 221 200   BMET Recent Labs    07/16/17 1746 07/17/17 0410 07/17/17 1507 07/18/17 0357  NA 140 138  --  140  K 2.9* 3.4*  --  4.0  CL 108 106  --  112*  CO2 24 25  --  24  GLUCOSE 143* 117*  --  102*  BUN 10 10  --  9  CREATININE 0.73 0.79 0.74 0.77  CALCIUM 8.8* 8.0*  --  8.3*   LFT Recent Labs    07/17/17 0123  PROT 7.2  ALBUMIN 3.4*  AST 35  ALT 22  ALKPHOS 111  BILITOT 0.4  BILIDIR <0.1*  IBILI NOT CALCULATED   PT/INR No results for input(s): LABPROT, INR in the last 72 hours.  Studies/Results: Dg Chest 2 View  Result Date: 07/16/2017 CLINICAL DATA:  Chest pain.  Status post endoscopy. EXAM: CHEST  2 VIEW COMPARISON:  05/25/2009 FINDINGS: The heart size and mediastinal contours are  within normal limits. No evidence of pneumomediastinum. Lung volumes are low bilaterally. There is no evidence of pulmonary edema, consolidation, pneumothorax, nodule or pleural fluid. The visualized skeletal structures are unremarkable. IMPRESSION: No acute findings in the chest. Electronically Signed   By: Aletta Edouard M.D.   On: 07/16/2017 16:42   Ct Chest Wo Contrast  Result Date: 07/16/2017 CLINICAL DATA:  Chest pain following esophageal dilatation EXAM: CT CHEST WITHOUT CONTRAST TECHNIQUE: Multidetector CT imaging of the chest was performed following the standard protocol without IV contrast. COMPARISON:  Chest CT May 06, 2007; chest radiograph July 16, 2017 FINDINGS: Cardiovascular: No thoracic aortic aneurysm is appreciable. Visualized great vessels appear unremarkable. Right and left common carotid arteries arise as a common trunk, an anatomic variant. There is aortic atherosclerosis. There are foci coronary artery calcification. There is no pericardial effusion or pericardial thickening. Mediastinum/Nodes: Thyroid appears unremarkable. There is no appreciable thoracic adenopathy. There is air in the esophagus to the level of the distal esophagus approximately 6 cm from the gastroesophageal junction. The appearance suggests that there may be edema in the wall of the esophagus in this area. There is no periesophageal soft tissue air or pneumomediastinum. Lungs/Pleura: There is bibasilar atelectatic change. There is no parenchymal lung edema or consolidation. No pneumothorax evident.  No pleural effusion or pleural thickening. Upper Abdomen: Gallbladder is absent. Visualized upper abdominal structures otherwise appear unremarkable. Musculoskeletal: No blastic or lytic bone lesions are evident. IMPRESSION: 1. There is wall thickening with probable edema in the distal esophagus. There is air in the esophagus proximal to this area of wall thickening distally. No esophageal disruption or  periesophageal air seen on this noncontrast enhanced study. There is no pneumomediastinum or pneumothorax. 2.  No lung edema or consolidation.  Mild bibasilar atelectasis. 3.  No appreciable adenopathy. 4. Foci of aortic atherosclerosis and coronary artery calcification. 5.  Gallbladder absent. Aortic Atherosclerosis (ICD10-I70.0). Electronically Signed   By: Lowella Grip III M.D.   On: 07/16/2017 18:22       Assessment / Plan:    57 y/o female s/p EGD 2 days ago for dysphagia showing a mild distal esophageal stricture, treated with balloon dilation, who experienced chest pain post-procedure. There was no mucosal wrent or obvious trauma to the esophagus observed endoscopically post dilation. CXR and CT chest has not shown any evidence of esophageal injury. Some thickening of the distal esophagus noted on CT - unclear chronicity of that finding and if reactive to dilation or not.   Troponins peaked at 0.41 yesterday, underwent cardiac catheterization per cardiology showing single vessel obstructive CAD, 75% mid PDA lesion, with other vessels with mild stenosis, focal wall motion abnormality, with EF 50%.   Overall, her discomfort is mild this AM, she continues to improve. She has tolerated a regular diet without any odynophagia or worsening of her symptoms, making esophageal etiology less likely. It's possible she had coronary spasm or atypical stress related cardiomyopathy per cardiology, medical management recommended.   She is awaiting echocardiogram today, if that looks okay perhaps she can go home later today, defer to cardiology. Okay to resume her oral medications, she is tolerating diet without any difficulty.   Please call with questions.   [place] Cellar, MD [facility] Pager [phone]

## 2017-07-18 NOTE — Discharge Summary (Signed)
Physician Discharge Summary  [name] VWU:[id number] DOB: May 23, 1961 DOA: 07/16/2017  PCP: [name], NP  Admit date: 07/16/2017 Discharge date: 07/18/2017  Recommendations for Outpatient Follow-up:  1. Hyperlipidemia in lipid clinic    Follow-up Information    [name], [name], MD. Schedule an appointment as soon as possible for a visit in 2 week(s).   Specialty:  Cardiology Contact information: [address] [place] [address] [phone]            Discharge Diagnoses:  1. Chest pain s/p EGD with dilation of distal esophageal stricture, Elevated troponin  2. Hyperlipidemia  3. Essential HTN 4. Normocytic anemia 5. Aortic atherosclerosis   Discharge Condition: improved Disposition: home  Diet recommendation: heart healthy  Filed Weights   07/17/17 0108  Weight: 101.3 kg (223 lb 5.2 oz)    History of present illness:  56yow developed CP and vomiting s/p outpt EGD with dilation and thus sent to ED. Admitted for chest pain evaluation with positive troponin.  Hospital Course:  Patient underwent extensive investigation with LHC, CT chest and echocardiogram, all of which were reassuring. Ultimately no evidence of procedure complication. Cardiology recommended followup in the advanced lipid clinic and Norvasc on discharge.  Chest pain s/p EGD with dilation of distal esophageal stricture, elevated troponin. CT chest negative for pneumomediastinum, EKG nonacute. LHC showed focal WMA, single vessel CAD 50%. Cardiologist rec medical management. Possible stress-induced or coronary vasospasm. Troponin peaked at .41 then trended down. Cardiologist reported echo unremarkable, no WMA (prelim report) and recommended discharge. - f/u in advanced lipid clinic  Hyperlipidemia  - LDL 198, continue statin  Essential HTN - remains stable - Norvasc on discharge  Normocytic anemia - remains stable  Chronic prolonged QT  Aortic  atherosclerosis    Consultants:  Cardiology   Procedures:   LEFT HEART CATH AND CORONARY ANGIOGRAPHY  Conclusion     Ost RPDA to RPDA lesion is 75% stenosed.  Prox RCA to Mid RCA lesion is 25% stenosed.  Ost Ramus to Ramus lesion is 50% stenosed.  The left ventricular systolic function is normal.  LV end diastolic pressure is normal.  The left ventricular ejection fraction is 50-55% by visual estimate.  1. Single vessel obstructive CAD with 75% mid PDA- this is a small vessel. 2. Focal wall motion abnormality involving the basal to mid anterior wall. Overall EF 50% 3. Normal LVEDP.  Impression: No significant CAD to explain clinical findings and wall motion abnormality of the anterior wall. Consideration for possible coronary spasm or atypical stress induced cardiomyopathy. Recommend medical management.       Discharge Instructions  Discharge Instructions    Diet - low sodium heart healthy   Complete by:  As directed    Discharge instructions   Complete by:  As directed    Call your physician or seek immediate medical attention for pain, shortness of breath or worsening of condition.   Increase activity slowly   Complete by:  As directed      Allergies as of 07/18/2017      Reactions   Ace Inhibitors    REACTION: chronic cough   Celebrex [celecoxib] Other (See Comments)   "makes me bleed"   Diovan [valsartan]    angioedema      Medication List    STOP taking these medications   meloxicam 7.5 MG tablet Commonly known as:  MOBIC   ondansetron 4 MG disintegrating tablet Commonly known as:  ZOFRAN ODT   ondansetron  4 MG tablet Commonly known as:  ZOFRAN   promethazine 25 MG suppository Commonly known as:  PHENADOZ   SUMAtriptan 50 MG tablet Commonly known as:  IMITREX     TAKE these medications   amitriptyline 50 MG tablet Commonly known as:  ELAVIL Take 1 tablet (50 mg total) by mouth at bedtime.   amLODipine 5 MG tablet Commonly  known as:  NORVASC Take 1 tablet (5 mg total) by mouth daily.   aspirin 81 MG chewable tablet Chew 1 tablet (81 mg total) by mouth daily. Start taking on:  07/19/2017   hydrochlorothiazide 25 MG tablet Commonly known as:  HYDRODIURIL TAKE 1 TABLET BY MOUTH ONCE DAILY   linaclotide 145 MCG Caps capsule Commonly known as:  LINZESS Take 1 capsule (145 mcg total) by mouth daily before breakfast.   methimazole 5 MG tablet Commonly known as:  TAPAZOLE Take 1 tablet (5 mg total) 3 (three) times a week by mouth.   methocarbamol 500 MG tablet Commonly known as:  ROBAXIN Take 1 tablet (500 mg total) by mouth every 8 (eight) hours as needed for muscle spasms.   nystatin powder Commonly known as:  MYCOSTATIN/NYSTOP Apply topically 2 (two) times daily.   potassium chloride SA 20 MEQ tablet Commonly known as:  K-DUR,KLOR-CON Take 1 tablet (20 mEq total) by mouth daily.   rosuvastatin 40 MG tablet Commonly known as:  CRESTOR TAKE 1 TABLET BY MOUTH EVERY OTHER DAY   traZODone 50 MG tablet Commonly known as:  DESYREL Take 0.5-1 tablets (25-50 mg total) by mouth at bedtime as needed for sleep.      Allergies  Allergen Reactions  . Ace Inhibitors     REACTION: chronic cough  . Celebrex [Celecoxib] Other (See Comments)    "makes me bleed"  . Diovan [Valsartan]     angioedema    The results of significant diagnostics from this hospitalization (including imaging, microbiology, ancillary and laboratory) are listed below for reference.    Significant Diagnostic Studies: Dg Chest 2 View  Result Date: 07/16/2017 CLINICAL DATA:  Chest pain.  Status post endoscopy. EXAM: CHEST  2 VIEW COMPARISON:  05/25/2009 FINDINGS: The heart size and mediastinal contours are within normal limits. No evidence of pneumomediastinum. Lung volumes are low bilaterally. There is no evidence of pulmonary edema, consolidation, pneumothorax, nodule or pleural fluid. The visualized skeletal structures are  unremarkable. IMPRESSION: No acute findings in the chest. Electronically Signed   By: Aletta Edouard M.D.   On: 07/16/2017 16:42   Ct Chest Wo Contrast  Result Date: 07/16/2017 CLINICAL DATA:  Chest pain following esophageal dilatation EXAM: CT CHEST WITHOUT CONTRAST TECHNIQUE: Multidetector CT imaging of the chest was performed following the standard protocol without IV contrast. COMPARISON:  Chest CT May 06, 2007; chest radiograph July 16, 2017 FINDINGS: Cardiovascular: No thoracic aortic aneurysm is appreciable. Visualized great vessels appear unremarkable. Right and left common carotid arteries arise as a common trunk, an anatomic variant. There is aortic atherosclerosis. There are foci coronary artery calcification. There is no pericardial effusion or pericardial thickening. Mediastinum/Nodes: Thyroid appears unremarkable. There is no appreciable thoracic adenopathy. There is air in the esophagus to the level of the distal esophagus approximately 6 cm from the gastroesophageal junction. The appearance suggests that there may be edema in the wall of the esophagus in this area. There is no periesophageal soft tissue air or pneumomediastinum. Lungs/Pleura: There is bibasilar atelectatic change. There is no parenchymal lung edema or consolidation. No pneumothorax evident. No  pleural effusion or pleural thickening. Upper Abdomen: Gallbladder is absent. Visualized upper abdominal structures otherwise appear unremarkable. Musculoskeletal: No blastic or lytic bone lesions are evident. IMPRESSION: 1. There is wall thickening with probable edema in the distal esophagus. There is air in the esophagus proximal to this area of wall thickening distally. No esophageal disruption or periesophageal air seen on this noncontrast enhanced study. There is no pneumomediastinum or pneumothorax. 2.  No lung edema or consolidation.  Mild bibasilar atelectasis. 3.  No appreciable adenopathy. 4. Foci of aortic  atherosclerosis and coronary artery calcification. 5.  Gallbladder absent. Aortic Atherosclerosis (ICD10-I70.0). Electronically Signed   By: Lowella Grip III M.D.   On: 07/16/2017 18:22   Labs: Basic Metabolic Panel: Recent Labs  Lab 07/16/17 1746 07/17/17 0123 07/17/17 0410 07/17/17 1507 07/18/17 0357  NA 140  --  138  --  140  K 2.9*  --  3.4*  --  4.0  CL 108  --  106  --  112*  CO2 24  --  25  --  24  GLUCOSE 143*  --  117*  --  102*  BUN 10  --  10  --  9  CREATININE 0.73  --  0.79 0.74 0.77  CALCIUM 8.8*  --  8.0*  --  8.3*  MG  --  1.6*  --   --   --    Liver Function Tests: Recent Labs  Lab 07/16/17 1746 07/17/17 0123  AST 28 35  ALT 26 22  ALKPHOS 125 111  BILITOT 0.3 0.4  PROT 7.9 7.2  ALBUMIN 3.8 3.4*   Recent Labs  Lab 07/16/17 1746  LIPASE 26   CBC: Recent Labs  Lab 07/16/17 1746 07/17/17 0410 07/17/17 1507 07/18/17 0357  WBC 10.3 9.8 7.8 6.9  HGB 11.5* 9.9* 10.1* 10.0*  HCT 37.2 32.5* 33.1* 32.6*  MCV 74.1* 74.0* 74.7* 74.3*  PLT 249 254 221 200   Cardiac Enzymes: Recent Labs  Lab 07/17/17 0410 07/17/17 1507  TROPONINI 0.41* 0.13*    CBG: Recent Labs  Lab 07/17/17 1129 07/17/17 1710 07/17/17 2135 07/18/17 0736 07/18/17 1148  GLUCAP 113* 119* 114* 97 97    Principal Problem:   Chest pain Active Problems:   Hyperlipidemia   Iron deficiency anemia due to chronic blood loss   HTN (hypertension)   Aortic atherosclerosis ([place])   Time coordinating discharge: 35 minutes  Signed:  Murray Hodgkins, MD [facility] 07/18/2017, 2:46 PM

## 2017-07-18 NOTE — Progress Notes (Signed)
Progress Note  Patient Name: [name] Date of Encounter: 07/18/2017  Primary Cardiologist: New, Dr Debara Pickett  Subjective   CP is now 3/10, has not been a 0/10 since before admission. No SOB.   Inpatient Medications    Scheduled Meds: . aspirin  81 mg Oral Daily  . heparin  5,000 Units Subcutaneous Q8H  . insulin aspart  0-5 Units Subcutaneous QHS  . insulin aspart  0-9 Units Subcutaneous TID WC  . magnesium oxide  800 mg Oral BID  . pantoprazole (PROTONIX) IV  40 mg Intravenous Q12H  . rosuvastatin  40 mg Oral q1800  . sodium chloride flush  3 mL Intravenous Q12H   Continuous Infusions: . sodium chloride     PRN Meds: sodium chloride, acetaminophen **OR** acetaminophen, hydrALAZINE, morphine injection, ondansetron **OR** ondansetron (ZOFRAN) IV, sodium chloride flush   Vital Signs    Vitals:   07/17/17 1245 07/17/17 1401 07/17/17 2054 07/18/17 0448  BP: 137/67 125/74 133/74 (!) 142/76  Pulse: 76 99 63 (!) 55  Resp: 17 18 18 18   Temp:  98.5 F (36.9 C) (!) 97.4 F (36.3 C) 97.9 F (36.6 C)  TempSrc:  Oral Oral Oral  SpO2: 100% 99% 96% 94%  Weight:      Height:        Intake/Output Summary (Last 24 hours) at 07/18/2017 0747 Last data filed at 07/18/2017 0600 Gross per 24 hour  Intake 480 ml  Output 3 ml  Net 477 ml   Filed Weights   07/17/17 0108  Weight: 223 lb 5.2 oz (101.3 kg)    Telemetry    SR, Sinus brady, HR high 40s sustained overnight. - Personally Reviewed  ECG    None today - Personally Reviewed  Physical Exam   General: Well developed, well nourished, female appearing in no acute distress. Head: Normocephalic, atraumatic.  Neck: Supple without bruits, JVD not elevated. Lungs:  Resp regular and unlabored, CTA. Heart: RRR, S1, S2, no S3, S4, or murmur; no rub. Abdomen: Soft, non-tender, non-distended with normoactive bowel sounds. No hepatomegaly. No rebound/guarding. No obvious abdominal masses. Extremities: No clubbing,  cyanosis, no edema. Distal pedal pulses are 2+ bilaterally. R radial cath site w/out hematoma, mild ecchymosis Neuro: Alert and oriented X 3. Moves all extremities spontaneously. Psych: Normal affect.  Labs    Hematology Recent Labs  Lab 07/17/17 0410 07/17/17 1507 07/18/17 0357  WBC 9.8 7.8 6.9  RBC 4.39 4.43 4.39  HGB 9.9* 10.1* 10.0*  HCT 32.5* 33.1* 32.6*  MCV 74.0* 74.7* 74.3*  MCH 22.6* 22.8* 22.8*  MCHC 30.5 30.5 30.7  RDW 14.3 14.4 14.4  PLT 254 221 200    Chemistry Recent Labs  Lab 07/16/17 1746 07/17/17 0123 07/17/17 0410 07/17/17 1507 07/18/17 0357  NA 140  --  138  --  140  K 2.9*  --  3.4*  --  4.0  CL 108  --  106  --  112*  CO2 24  --  25  --  24  GLUCOSE 143*  --  117*  --  102*  BUN 10  --  10  --  9  CREATININE 0.73  --  0.79 0.74 0.77  CALCIUM 8.8*  --  8.0*  --  8.3*  PROT 7.9 7.2  --   --   --   ALBUMIN 3.8 3.4*  --   --   --   AST 28 35  --   --   --  ALT 26 22  --   --   --   ALKPHOS 125 111  --   --   --   BILITOT 0.3 0.4  --   --   --   GFRNONAA >60  --  >60 >60 >60  GFRAA >60  --  >60 >60 >60  ANIONGAP 8  --  7  --  4*     Cardiac Enzymes Recent Labs  Lab 07/17/17 0410 07/17/17 1507  TROPONINI 0.41* 0.13*    Recent Labs  Lab 07/16/17 1822 07/16/17 2155  TROPIPOC 0.11* 0.31*    Lab Results  Component Value Date   CHOL 263 (H) 07/17/2017   HDL 39 (L) 07/17/2017   LDLCALC 198 (H) 07/17/2017   LDLDIRECT 131.0 10/31/2016   TRIG 131 07/17/2017   CHOLHDL 6.7 07/17/2017   Lab Results  Component Value Date   HGBA1C 5.9 (H) 10/19/2016     Radiology    Dg Chest 2 View  Result Date: 07/16/2017 CLINICAL DATA:  Chest pain.  Status post endoscopy. EXAM: CHEST  2 VIEW COMPARISON:  05/25/2009 FINDINGS: The heart size and mediastinal contours are within normal limits. No evidence of pneumomediastinum. Lung volumes are low bilaterally. There is no evidence of pulmonary edema, consolidation, pneumothorax, nodule or pleural  fluid. The visualized skeletal structures are unremarkable. IMPRESSION: No acute findings in the chest. Electronically Signed   By: Aletta Edouard M.D.   On: 07/16/2017 16:42   Ct Chest Wo Contrast  Result Date: 07/16/2017 CLINICAL DATA:  Chest pain following esophageal dilatation EXAM: CT CHEST WITHOUT CONTRAST TECHNIQUE: Multidetector CT imaging of the chest was performed following the standard protocol without IV contrast. COMPARISON:  Chest CT May 06, 2007; chest radiograph July 16, 2017 FINDINGS: Cardiovascular: No thoracic aortic aneurysm is appreciable. Visualized great vessels appear unremarkable. Right and left common carotid arteries arise as a common trunk, an anatomic variant. There is aortic atherosclerosis. There are foci coronary artery calcification. There is no pericardial effusion or pericardial thickening. Mediastinum/Nodes: Thyroid appears unremarkable. There is no appreciable thoracic adenopathy. There is air in the esophagus to the level of the distal esophagus approximately 6 cm from the gastroesophageal junction. The appearance suggests that there may be edema in the wall of the esophagus in this area. There is no periesophageal soft tissue air or pneumomediastinum. Lungs/Pleura: There is bibasilar atelectatic change. There is no parenchymal lung edema or consolidation. No pneumothorax evident. No pleural effusion or pleural thickening. Upper Abdomen: Gallbladder is absent. Visualized upper abdominal structures otherwise appear unremarkable. Musculoskeletal: No blastic or lytic bone lesions are evident. IMPRESSION: 1. There is wall thickening with probable edema in the distal esophagus. There is air in the esophagus proximal to this area of wall thickening distally. No esophageal disruption or periesophageal air seen on this noncontrast enhanced study. There is no pneumomediastinum or pneumothorax. 2.  No lung edema or consolidation.  Mild bibasilar atelectasis. 3.  No  appreciable adenopathy. 4. Foci of aortic atherosclerosis and coronary artery calcification. 5.  Gallbladder absent. Aortic Atherosclerosis (ICD10-I70.0). Electronically Signed   By: Lowella Grip III M.D.   On: 07/16/2017 18:22     Cardiac Studies   ECHO: 07/18/2017 - ordered  CATH: 07/17/2017  Ost RPDA to RPDA lesion is 75% stenosed.  Prox RCA to Mid RCA lesion is 25% stenosed.  Ost Ramus to Ramus lesion is 50% stenosed.  The left ventricular systolic function is normal.  LV end diastolic pressure is normal.  The left  ventricular ejection fraction is 50-55% by visual estimate.  1. Single vessel obstructive CAD with 75% mid PDA- this is a small vessel. 2. Focal wall motion abnormality involving the basal to mid anterior wall. Overall EF 50% 3. Normal LVEDP. Impression: No significant CAD to explain clinical findings and wall motion abnormality of the anterior wall. Consideration for possible coronary spasm or atypical stress induced cardiomyopathy. Recommend medical management.    Patient Profile     57 y.o. female w/ hx HTN, GERD, PUD, gastroparesis, anemia, HLD, DM, was admitted 01/22 with chest pain that started after EGD/colonoscopy (done for dysphagia/hematochezia). Pt also had vomiting, cards saw for CP and troponin elevation. K+ initially 2.3. Had cath 01/23>>non-obs dz.  Assessment & Plan    Principal Problem: 1.  Chest pain, elevated troponin - In setting of severe N&V - Mild troponin elevation, +WMA on LV gram at cath but no critical CAD - possible spasm - on ASA and high-dose statin - no BB 2nd bradycardia - f/u on echo  Active Problems: 2.  Hyperlipidemia - LDL has been as high as 265 - currently 198 - pt reports compliance w/ RX and tries to eat healthy - MD advise on PCSK9  3.  HTN (hypertension) - home rx includes Norvasc 5 mg, will restart for BP control and possible spasm  4. Borderline DM - encouraged pt to go on a diabetic diet    Otherwise, per IM   Iron deficiency anemia due to chronic blood loss   Aortic atherosclerosis ([place])  Plan: MD advise if any further eval needed once echo done.  Jonetta Speak , PA-C 7:47 AM 07/18/2017 Pager: [phone]

## 2017-07-18 NOTE — Progress Notes (Signed)
  PROGRESS NOTE  [name] GYI:[id number] DOB: 1961/04/20 DOA: 07/16/2017 PCP: [name], NP  Brief Narrative: 56yow developed CP and vomiting s/p outpt EGD with dilation and thus sent to ED. Admitted for chest pain evaluation with positive tropnin.  Assessment/Plan Chest pain s/p EGD with dilation of distal esophageal stricture, elevated troponin. CT chest negative for pneumomediastinum, EKG nonacute. LHC showed focal WMA, single vessel CAD 50%. Cardiologist rec medical management. Possible stress-induced or coronary vasospasm. Troponin peaked at .41 then trended down - pain has decreased to 3/10 - awaiting echo - no BB secondary to bradycardia  Hyperlipidemia  - LDL 198, continue statin  Essential HTN - remains stable  Normocytic anemia - remains stable  Chronic prolonged QT  Aortic atherosclerosis     Likely home today pending echo and cardiology sign off  DVT prophylaxis: heparin Code Status: full Family Communication: none Disposition Plan: home    Murray Hodgkins, MD  [facility] Direct contact: [phone] --[place]ia [place]  --[email or web address]; password [email or web address]  7PM-7AM contact night coverage as above 07/18/2017, 10:35 AM  LOS: 0 days   Consultants:  Cardiology   Procedures:   LEFT HEART CATH AND CORONARY ANGIOGRAPHY  Conclusion     Ost RPDA to RPDA lesion is 75% stenosed.  Prox RCA to Mid RCA lesion is 25% stenosed.  Ost Ramus to Ramus lesion is 50% stenosed.  The left ventricular systolic function is normal.  LV end diastolic pressure is normal.  The left ventricular ejection fraction is 50-55% by visual estimate.   1. Single vessel obstructive CAD with 75% mid PDA- this is a small vessel. 2. Focal wall motion abnormality involving the basal to mid anterior wall. Overall EF 50% 3. Normal LVEDP.  Impression: No significant CAD to explain clinical findings and wall motion abnormality of the anterior wall. Consideration  for possible coronary spasm or atypical stress induced cardiomyopathy. Recommend medical management.      Antimicrobials:    Interval history/Subjective: Feels better, CP 3/10. Tolerating diet.  Objective: Vitals:  Vitals:   07/18/17 0448 07/18/17 0900  BP: (!) 142/76 131/61  Pulse: (!) 55 88  Resp: 18 19  Temp: 97.9 F (36.6 C) 98.4 F (36.9 C)  SpO2: 94% 99%    Exam:   Constitutional:   . Appears calm and comfortable Respiratory:  . CTA bilaterally, no w/r/r.  . Respiratory effort normal. Cardiovascular:  . RRR, no m/r/g . No LE extremity edema   Telemetry SR Psychiatric:  . Mental status o Mood, affect appropriate  I have personally reviewed the following:   Labs:  BMP unremarkable 11/24  Hgb stable 10.0   Scheduled Meds: . aspirin  81 mg Oral Daily  . heparin  5,000 Units Subcutaneous Q8H  . insulin aspart  0-5 Units Subcutaneous QHS  . insulin aspart  0-9 Units Subcutaneous TID WC  . magnesium oxide  800 mg Oral BID  . pantoprazole (PROTONIX) IV  40 mg Intravenous Q12H  . rosuvastatin  40 mg Oral q1800  . sodium chloride flush  3 mL Intravenous Q12H   Continuous Infusions: . sodium chloride      Principal Problem:   Chest pain Active Problems:   Hyperlipidemia   Iron deficiency anemia due to chronic blood loss   HTN (hypertension)   Aortic atherosclerosis ([place])   LOS: 0 days

## 2017-07-18 NOTE — Progress Notes (Signed)
  Echocardiogram 2D Echocardiogram has been performed.  [name] 07/18/2017, 2:03 PM

## 2017-07-19 ENCOUNTER — Telehealth: Payer: Self-pay

## 2017-07-19 ENCOUNTER — Ambulatory Visit
Admission: RE | Admit: 2017-07-19 | Discharge: 2017-07-19 | Disposition: A | Discharge status: Home / Self Care | Payer: BC Managed Care – PPO | Source: Ambulatory Visit | Attending: Family | Admitting: Family

## 2017-07-19 ENCOUNTER — Other Ambulatory Visit: Payer: Self-pay | Admitting: Family

## 2017-07-19 ENCOUNTER — Ambulatory Visit
Admission: RE | Admit: 2017-07-19 | Discharge: 2017-07-19 | Disposition: A | Discharge status: Home / Self Care | Payer: Medicare PPO | Source: Ambulatory Visit | Attending: Family | Admitting: Family

## 2017-07-19 DIAGNOSIS — N6489 Other specified disorders of breast: Secondary | ICD-10-CM

## 2017-07-19 NOTE — Telephone Encounter (Signed)
[facility] follow up cal attempted. Left message for return call.

## 2017-07-22 ENCOUNTER — Telehealth: Payer: Self-pay

## 2017-07-22 NOTE — Telephone Encounter (Signed)
07/22/17  TCM Hospital Follow Up  Transition Care Management Follow-up Telephone Call  ADMISSION DATE:  07/16/17  DISCHARGE DATE: 07/18/17   How have you been since you were released from the hospital?  Feeling fine per patient   Do you understand why you were in the hospital? yes   Do you understand the discharge instrcutions? Yes    Items Reviewed:  Medications reviewed: Yes   Allergies reviewed: Yes   Dietary changes reviewed: Low sodium heart healthy    Referrals reviewed: appointment scheduled with M. Inda Castle   Functional Questionnaire:   Activities of Daily Living (ADLs): Able to perform all indepently  Any patient concerns? None at this time   Confirmed importance and date/time of follow-up visits scheduled:Yes   Confirmed with patient if condition begins to worsen call PCP or go to the ER. Yes    Patient was given the office number and encouragred to call back with questions or concerns. Yes

## 2017-07-23 ENCOUNTER — Ambulatory Visit: Payer: Medicare PPO | Admitting: Physical Therapy

## 2017-07-25 ENCOUNTER — Ambulatory Visit: Payer: Medicare PPO

## 2017-07-29 ENCOUNTER — Ambulatory Visit (INDEPENDENT_AMBULATORY_CARE_PROVIDER_SITE_OTHER): Payer: Medicare PPO | Admitting: Family

## 2017-07-29 ENCOUNTER — Telehealth: Payer: Self-pay | Admitting: Family

## 2017-07-29 ENCOUNTER — Encounter: Payer: Self-pay | Admitting: Family

## 2017-07-29 VITALS — BP 126/78 | HR 61 | Temp 98.9°F | Resp 16 | Ht 64.0 in | Wt 225.0 lb

## 2017-07-29 DIAGNOSIS — E785 Hyperlipidemia, unspecified: Secondary | ICD-10-CM

## 2017-07-29 DIAGNOSIS — R0789 Other chest pain: Secondary | ICD-10-CM | POA: Diagnosis not present

## 2017-07-29 DIAGNOSIS — I10 Essential (primary) hypertension: Secondary | ICD-10-CM | POA: Diagnosis not present

## 2017-07-29 NOTE — Telephone Encounter (Signed)
Noted  

## 2017-07-29 NOTE — Patient Instructions (Addendum)
Please add aspirin 81mg once daily.  

## 2017-07-29 NOTE — Progress Notes (Signed)
Subjective:    Patient ID: [name], female    DOB: May 07, 1961, 57 y.o.   MRN: [id number]  HPI   Patient is a 57 yr old female who presents today for hospital follow up.  Hospital record is reviewed.  Hospital discharge summary is reviewed.  She underwent an outpatient endoscopy and later developed chest pain and vomiting.  She was sent to the emergency department.  She had a positive troponin in the emergency department and was admitted for further evaluation for chest pain.  During her admission she underwent a left heart cath as well as a CT chest and echocardiogram.  These tests were reassuring and there was no obvious evidence of complication from her endoscopy.  She was noted to have an LDL of 198 on her statin and it was recommended by cardiology that she be followed in the lipid clinic.  It was felt that her elevated troponin was likely due to stress or coronary vasospasm.   HTN- she was continued on amlodipine 5mg  once daily and hctz.      Review of Systems    see HPI  Past Medical History:  Diagnosis Date  . Allergy    allergic rhinitis  . Arthritis   . Atypical chest pain   . Cancer [facility])    having a biopsy on left breast this friday for abnormal finding  . Constipation   . Cyst, ovarian   . Diabetes mellitus, type II ([place])   . Endometriosis   . Esophageal stricture   . Gastroparesis   . GERD (gastroesophageal reflux disease)   . Heart murmur   . Hyperlipidemia   . Hypertension   . 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 Iron deficiency anemia   . Migraine headache   . Non-compliance   . Peptic ulcer disease   . Thyroid disease      Social History   Socioeconomic History  . Marital status: Widowed    Spouse name: Not on file  . Number of children: 2  . Years of education: Not on file  . Highest education level: Not on file  Social Needs  . Financial resource strain: Not on file  . Food  insecurity - worry: Not on file  . Food insecurity - inability: Not on file  . Transportation needs - medical: Not on file  . Transportation needs - non-medical: Not on file  Occupational History  . Occupation: DISABILITY    Employer: UNEMPLOYED  Tobacco Use  . Smoking status: Former Smoker    Packs/day: 0.50    Years: 18.00    Pack years: 9.00    Types: Cigarettes    Start date: 07/29/1977    Last attempt to quit: 06/25/1994    Years since quitting: 23.1  . Smokeless tobacco: Never Used  . Tobacco comment: quit 19 years ago  Substance and Sexual Activity  . Alcohol use: Yes    Alcohol/week: 0.0 oz    Comment: social drinker  . Drug use: No  . Sexual activity: Not Currently  Other Topics Concern  . Not on file  Social History Narrative   Married    Has 2 grown children.  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   Former Smoker Quit tobacco in 1996.  She was a pack a day smoker for approximately 10 years.   Alcohol use-yes: Social    Daily Caffeine Use:6 pack of pepsi daily  Illicit Drug Use - no    Patient does not get regular exercise.       Smoking Status:  quit    Past Surgical History:  Procedure Laterality Date  . ABDOMINAL EXPLORATION SURGERY     w/bso   . CARDIAC CATHETERIZATION  2009   mild non obstructive CAD  . CHOLECYSTECTOMY    . KNEE SURGERY  2005    left knee  . LEFT HEART CATH AND CORONARY ANGIOGRAPHY N/A 07/17/2017   Procedure: LEFT HEART CATH AND CORONARY ANGIOGRAPHY;  Surgeon: Martinique, Peter M, MD;  Location: [place] [facility];  Service: Cardiovascular;  Laterality: N/A;  . TOTAL ABDOMINAL HYSTERECTOMY      Family History  Problem Relation Age of Onset  . Hypertension Mother   . Rheum arthritis Mother   . Hypertension Father   . Diabetes Father   . Prostate cancer Father   . Kidney disease Father   . Diabetes Sister   . Fibromyalgia Sister   . Diabetes Maternal Grandmother   . Lung cancer Maternal Grandfather   . Colon cancer Neg Hx   .  Thyroid disease Neg Hx     Allergies  Allergen Reactions  . Ace Inhibitors     REACTION: chronic cough  . Celebrex [Celecoxib] Other (See Comments)    "makes me bleed"  . Diovan [Valsartan]     angioedema    Current Outpatient Medications on File Prior to Visit  Medication Sig Dispense Refill  . amitriptyline (ELAVIL) 50 MG tablet Take 1 tablet (50 mg total) by mouth at bedtime. 90 tablet 1  . amLODipine (NORVASC) 5 MG tablet Take 1 tablet (5 mg total) by mouth daily. 90 tablet 1  . aspirin 81 MG chewable tablet Chew 1 tablet (81 mg total) by mouth daily.    . hydrochlorothiazide (HYDRODIURIL) 25 MG tablet TAKE 1 TABLET BY MOUTH ONCE DAILY 90 tablet 0  . linaclotide (LINZESS) 145 MCG CAPS capsule Take 1 capsule (145 mcg total) by mouth daily before breakfast. 12 capsule 0  . methimazole (TAPAZOLE) 5 MG tablet Take 1 tablet (5 mg total) 3 (three) times a week by mouth. 12 tablet 5  . methocarbamol (ROBAXIN) 500 MG tablet Take 1 tablet (500 mg total) by mouth every 8 (eight) hours as needed for muscle spasms. 20 tablet 0  . nystatin (MYCOSTATIN/NYSTOP) powder Apply topically 2 (two) times daily. 60 g 0  . potassium chloride SA (K-DUR,KLOR-CON) 20 MEQ tablet Take 1 tablet (20 mEq total) by mouth daily. 30 tablet 5  . rosuvastatin (CRESTOR) 40 MG tablet TAKE 1 TABLET BY MOUTH EVERY OTHER DAY 15 tablet 1  . traZODone (DESYREL) 50 MG tablet Take 0.5-1 tablets (25-50 mg total) by mouth at bedtime as needed for sleep. 30 tablet 3  . [DISCONTINUED] atorvastatin (LIPITOR) 40 MG tablet Take 1 tablet (40 mg total) by mouth daily. 30 tablet 3  . [DISCONTINUED] famotidine (PEPCID) 20 MG tablet Take 1 tablet (20 mg total) by mouth 2 (two) times daily. 30 tablet 0   No current facility-administered medications on file prior to visit.     BP 126/78 (BP Location: Left Arm, Patient Position: Sitting, Cuff Size: Large)   Pulse 61   Temp 98.9 F (37.2 C) (Oral)   Resp 16   Ht 5\' 4"  (1.626 m)   Wt  225 lb (102.1 kg)   SpO2 100%   BMI 38.62 kg/m    Objective:   Physical Exam  Constitutional: She is  oriented to person, place, and time. She appears well-developed and well-nourished.  HENT:  Head: Normocephalic and atraumatic.  Cardiovascular: Normal rate, regular rhythm and normal heart sounds.  No murmur heard. Pulmonary/Chest: Effort normal and breath sounds normal. No respiratory distress. She has no wheezes.  Musculoskeletal: She exhibits no edema.  Neurological: She is alert and oriented to person, place, and time.  Psychiatric: She has a normal mood and affect. Her behavior is normal. Judgment and thought content normal.          Assessment & Plan:  Hyperlipidemia-refer to lipid clinic. Reports good compliance with statin, continue same.  HTN- stable on current meds.  Atypical CP- advised pt to begin aspirin 81mg  once daily.  Follow up with cardiology.

## 2017-07-29 NOTE — Telephone Encounter (Signed)
She is scheduled with Dr. Harrington Challenger in March. Is this appointment a lipid clinic or provider clinic appointment?  She also needs an appointment with a provider for hospital follow up. Could you please arrange if not already set up?

## 2017-07-29 NOTE — Telephone Encounter (Signed)
Spoke with [name] at [facility], she reports appt on 08/26/17 is a consultation with Dr Harrington Challenger to determine if pt is a candidate for the lipid clinic. Appt for lipid clinic will be made after pt's consultation.

## 2017-07-30 ENCOUNTER — Ambulatory Visit: Payer: Medicare PPO | Admitting: Physical Therapy

## 2017-08-01 ENCOUNTER — Ambulatory Visit: Payer: Medicare PPO

## 2017-08-06 ENCOUNTER — Ambulatory Visit: Payer: Medicare PPO | Attending: Family

## 2017-08-06 VITALS — BP 132/86 | HR 78

## 2017-08-06 DIAGNOSIS — M6281 Muscle weakness (generalized): Secondary | ICD-10-CM | POA: Diagnosis present

## 2017-08-06 DIAGNOSIS — M545 Low back pain, unspecified: Secondary | ICD-10-CM

## 2017-08-06 DIAGNOSIS — G8929 Other chronic pain: Secondary | ICD-10-CM

## 2017-08-06 DIAGNOSIS — M62830 Muscle spasm of back: Secondary | ICD-10-CM

## 2017-08-06 NOTE — Therapy (Signed)
[facility] [address]  [place], [place], [address] Phone: [phone]   Fax:  [phone]  Physical Therapy Treatment  Patient Details  Name: [name] MRN: [id number] Date of Birth: 09/25/1960 Referring Provider: [name], NP   Encounter Date: 08/06/2017  PT End of Session - 08/06/17 0924    Visit Number  6    Number of Visits  12    Date for PT Re-Evaluation  08/16/17    Authorization Type  [employer] Medicare    PT Start Time  0918    PT Stop Time  1000    PT Time Calculation (min)  42 min    Activity Tolerance  Patient tolerated treatment well    Behavior During Therapy  [facility] for tasks assessed/performed       Past Medical History:  Diagnosis Date  . Allergy    allergic rhinitis  . Arthritis   . Atypical chest pain   . Cancer [facility])    having a biopsy on left breast this friday for abnormal finding  . Constipation   . Cyst, ovarian   . Diabetes mellitus, type II ([place])   . Endometriosis   . Esophageal stricture   . Gastroparesis   . GERD (gastroesophageal reflux disease)   . Heart murmur   . Hyperlipidemia   . Hypertension   . 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 Iron deficiency anemia   . Migraine headache   . Non-compliance   . Peptic ulcer disease   . Thyroid disease     Past Surgical History:  Procedure Laterality Date  . ABDOMINAL EXPLORATION SURGERY     w/bso   . CARDIAC CATHETERIZATION  2009   mild non obstructive CAD  . CHOLECYSTECTOMY    . KNEE SURGERY  2005    left knee  . LEFT HEART CATH AND CORONARY ANGIOGRAPHY N/A 07/17/2017   Procedure: LEFT HEART CATH AND CORONARY ANGIOGRAPHY;  Surgeon: Martinique, Peter M, MD;  Location: [place] [facility];  Service: Cardiovascular;  Laterality: N/A;  . TOTAL ABDOMINAL HYSTERECTOMY      Vitals:   08/06/17 0917 08/06/17 1008  BP: 126/84 132/86  Pulse: 72 78     Subjective Assessment - 08/06/17 0917    Subjective  Pt. doing well today.  Denies recent chest pain.  Denies recent back pain over this past week however reports, "I haven't really been doing anything since seeing the doctor".      Diagnostic tests  No recent imaging; Lumbar MRI 10/18/10: No likely significant finding.  Mild desiccation and bulging of the discs at L4-5 and L5-S1.  No stenosis of the canal or foramina.    Patient Stated Goals  "get a whole lot better"    Currently in Pain?  No/denies    Pain Score  0-No pain    Multiple Pain Sites  No         [facility] PT Assessment - 08/06/17 0954      Strength   Strength Assessment Site  Hip;Knee    Right/Left Hip  Right;Left    Right Hip Flexion  4-/5    Right Hip Extension  4-/5    Right Hip External Rotation   4/5    Right Hip Internal Rotation  4-/5    Right Hip ABduction  4/5    Right Hip ADduction  3+/5    Left Hip Flexion  4/5    Left Hip Extension  4-/5    Left Hip External Rotation  4/5    Left Hip Internal Rotation  4-/5    Left Hip ABduction  4/5    Left Hip ADduction  4-/5    Right/Left Knee  Right;Left    Right Knee Flexion  4/5    Right Knee Extension  4+/5    Left Knee Flexion  4+/5    Left Knee Extension  4+/5                  [facility] Adult PT Treatment/Exercise - 08/06/17 0926      Lumbar Exercises: Aerobic   Nustep  L4 x 7'      Lumbar Exercises: Machines for Strengthening   Other Lumbar Machine Exercise  BATCA B single arm row 10# x 10 reps each UE       Lumbar Exercises: Standing   Functional Squats  10 reps;3 seconds    Functional Squats Limitations  counter squat      Lumbar Exercises: Seated   Other Seated Lumbar Exercises  B row with red TB seated on green p-ball x 15 reps     Other Seated Lumbar Exercises  B shoulder extension/row with red TB seated on green p-ball x 15 reps       Lumbar Exercises: Sidelying   Clam  Both;15 reps;3 seconds    Clam Limitations  red TB       Knee/Hip Exercises: Standing   Hip Flexion  Both;10 reps;Knee straight;Stengthening    Hip Flexion Limitations  red TB; cues for abd bracing    Hip ADduction  Both;10 reps;Strengthening    Hip ADduction Limitations  red TB; cues for abd bracing    Hip Abduction  Both;10 reps;Knee straight;Stengthening    Abduction Limitations  red TB; cues for abd bracing    Hip Extension  Both;10 reps;Knee straight;Stengthening    Extension Limitations  red TB; cues for abd bracing               PT Short Term Goals - 07/11/17 1119      PT SHORT TERM GOAL #1   Title  Independent with initial HEP    Status  Achieved        PT Long Term Goals - 08/06/17 1005      PT LONG TERM GOAL #1   Title  Independent with ongoing HEP +/- gym program    Status  Partially Met met for current       PT LONG TERM GOAL #2   Title  Pt will verbalize/demonstrate understanding of neutral spine posture and proper body mechanics for typical daily tasks to reduce limbar strain    Status  On-going      PT LONG TERM GOAL #3   Title  B LE strength >/= 4/5 for improved function    Status  Partially Met      PT LONG TERM GOAL #4   Title  Pt will report at least 50% improvement in LBP to allow for decreased pain interference with daily tasks    Status  On-going            Plan - 08/06/17 3159    Clinical Impression Statement  Pt. returning to therapy after nearly three-week absence due to health complications.  Reports she has been feeling well over this past week and denies recent chest pain or LBP.  Reports, "I haven't been doing much recently" however  notes she has consistently performed HEP without issue.  Has been able to perform all household activities over this past week without LBP.  Feels good improvement in LBP since starting with therapy.  Tolerated all lumbopelvic and LE strengthening activities well today without issue and vitals WFL throughout session.  Progressing well toward goals at this  point.      PT Treatment/Interventions  Patient/family education;ADLs/Self Care Home Management;Neuromuscular re-education;Therapeutic exercise;Therapeutic activities;Functional mobility training;Balance training;Manual techniques;Dry needling;Taping;Electrical Stimulation;Moist Heat;Cryotherapy;Ultrasound;Iontophoresis 58m/ml Dexamethasone;Traction    Consulted and Agree with Plan of Care  Patient       Patient will benefit from skilled therapeutic intervention in order to improve the following deficits and impairments:  Pain, Impaired flexibility, Increased muscle spasms, Decreased range of motion, Decreased strength, Postural dysfunction, Improper body mechanics, Decreased mobility, Decreased activity tolerance  Visit Diagnosis: Chronic right-sided low back pain without sciatica  Muscle spasm of back  Muscle weakness (generalized)     Problem List Patient Active Problem List   Diagnosis Date Noted  . Aortic atherosclerosis ([place] 07/17/2017  . Chest pain 07/16/2017  . Insomnia 01/12/2017  . Hyperglycemia 10/07/2014  . HTN (hypertension) 09/18/2012  . Breast cyst 06/09/2012  . Hyperthyroidism 02/29/2012  . Unspecified constipation 04/18/2011  . Can't get food down 04/18/2011  . Ulnar neuropathy 03/30/2011  . Low back pain 10/18/2010  . FIBROMYALGIA 07/20/2009  . Hyperlipidemia 05/26/2009  . VITAMIN D DEFICIENCY 05/23/2009  . Iron deficiency anemia due to chronic blood loss 10/25/2008  . GERD 10/25/2008  . LIVER HEMANGIOMA 01/01/2008  . ESOPHAGEAL STRICTURE 01/01/2008  . GASTROPARESIS 01/01/2008  . ALLERGIC RHINITIS 12/08/2007  . Borderline type 2 diabetes mellitus 10/31/2007  . Migraine headache 08/11/2007  . HIATAL HERNIA 08/11/2007  . OVARIAN CYST 08/11/2007  . Essential hypertension, benign 05/06/2007    MBess Harvest PTA 08/06/17 10:18 AM  [facility] [address] [place]  [address]Phone: [phone]  Fax:  [phone] Name: Denise BEAMMRN: [id number] of Birth: [birth year]

## 2017-08-08 ENCOUNTER — Ambulatory Visit: Payer: Medicare PPO

## 2017-08-08 VITALS — BP 142/84 | HR 76

## 2017-08-08 DIAGNOSIS — M6281 Muscle weakness (generalized): Secondary | ICD-10-CM

## 2017-08-08 DIAGNOSIS — M545 Low back pain, unspecified: Secondary | ICD-10-CM

## 2017-08-08 DIAGNOSIS — M62830 Muscle spasm of back: Secondary | ICD-10-CM

## 2017-08-08 DIAGNOSIS — G8929 Other chronic pain: Secondary | ICD-10-CM

## 2017-08-08 NOTE — Therapy (Signed)
[facility] [address]  [place], [place], [address] Phone: [phone]   Fax:  [phone]  Physical Therapy Treatment  Patient Details  Name: [name] MRN: [id number] Date of Birth: [birth year] Referring Provider: [name], NP   Encounter Date: 08/08/2017  PT End of Session - 08/08/17 0941    Visit Number  7    Number of Visits  12    Date for PT Re-Evaluation  08/16/17    Authorization Type  UHC Medicare    PT Start Time  0934    PT Stop Time  1014    PT Time Calculation (min)  40 min    Activity Tolerance  Patient tolerated treatment well    Behavior During Therapy  [facility] for tasks assessed/performed       Past Medical History:  Diagnosis Date  . Allergy    allergic rhinitis  . Arthritis   . Atypical chest pain   . Cancer [facility])    having a biopsy on left breast this friday for abnormal finding  . Constipation   . Cyst, ovarian   . Diabetes mellitus, type II ([place])   . Endometriosis   . Esophageal stricture   . Gastroparesis   . GERD (gastroesophageal reflux disease)   . Heart murmur   . Hyperlipidemia   . Hypertension   . 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 Iron deficiency anemia   . Migraine headache   . Non-compliance   . Peptic ulcer disease   . Thyroid disease     Past Surgical History:  Procedure Laterality Date  . ABDOMINAL EXPLORATION SURGERY     w/bso   . CARDIAC CATHETERIZATION  2009   mild non obstructive CAD  . CHOLECYSTECTOMY    . KNEE SURGERY  2005    left knee  . LEFT HEART CATH AND CORONARY ANGIOGRAPHY N/A 07/17/2017   Procedure: LEFT HEART CATH AND CORONARY ANGIOGRAPHY;  Surgeon: Martinique, Peter M, MD;  Location: [place] [facility];  Service: Cardiovascular;  Laterality: N/A;  . TOTAL ABDOMINAL HYSTERECTOMY      Vitals:   08/08/17 0938 08/08/17 0939  BP: (!) 142/84   Pulse: 76 76  SpO2: 99% 98%     Subjective Assessment - 08/08/17 0939    Subjective  Pt. noting some chest tenderness at sight of recent biopsy taken on 1.25.19.  Doing well today.      Diagnostic tests  No recent imaging; Lumbar MRI 10/18/10: No likely significant finding.  Mild desiccation and bulging of the discs at L4-5 and L5-S1.  No stenosis of the canal or foramina.    Patient Stated Goals  "get a whole lot better"    Currently in Pain?  Yes    Pain Score  1     Pain Location  Chest    Pain Orientation  Left    Pain Descriptors / Indicators  Tender    Pain Type  Acute pain    Pain Onset  Yesterday    Multiple Pain Sites  No                      [facility] Adult PT Treatment/Exercise - 08/08/17 0948      Self-Care   Self-Care  Other Self-Care Comments    Other Self-Care Comments   Discussion of proper body mechanics with lifting and moving furniture as pt. with  plans to move her furniture to accommodate floor company to install floor; referenced handout on proper body mechanics with household tasks      Lumbar Exercises: Aerobic   Nustep  L4 x 6'      Lumbar Exercises: Standing   Functional Squats  3 seconds;15 reps    Functional Squats Limitations  TRX      Lumbar Exercises: Seated   Long Arc Quad on [place]  Both;15 reps cues required to maintain neutral pelvis    LAQ on Ball Limitations  green Pball    Other Seated Lumbar Exercises  B row with red TB seated on green p-ball x 15 reps  cues required to maintain upright posture     Other Seated Lumbar Exercises  B pallof press with red TB seated on green p-ball x 15 reps each way  Cues required to maintain upright posture       Lumbar Exercises: Supine   Bridge  5 seconds;15 reps    Bridge Limitations  + adduction ball squeeze     Isometric Hip Flexion  10 reps;5 seconds    Isometric Hip Flexion Limitations  single leg with opposite LE resting on table       Lumbar Exercises: Quadruped   Madcat/Old Horse  10 reps    Madcat/Old Horse  Limitations  Some cueing required for overall technique     Straight Leg Raise  10 reps;3 seconds             PT Education - 08/08/17 1214    Education provided  Yes    Education Details  Issued handout on proper body mechanics and posture with daily activities.      Person(s) Educated  Patient    Methods  Explanation;Demonstration;Verbal cues;Handout    Comprehension  Verbalized understanding;Verbal cues required;Need further instruction       PT Short Term Goals - 07/11/17 1119      PT SHORT TERM GOAL #1   Title  Independent with initial HEP    Status  Achieved        PT Long Term Goals - 08/06/17 1005      PT LONG TERM GOAL #1   Title  Independent with ongoing HEP +/- gym program    Status  Partially Met met for current       PT LONG TERM GOAL #2   Title  Pt will verbalize/demonstrate understanding of neutral spine posture and proper body mechanics for typical daily tasks to reduce limbar strain    Status  On-going      PT LONG TERM GOAL #3   Title  B LE strength >/= 4/5 for improved function    Status  Partially Met      PT LONG TERM GOAL #4   Title  Pt will report at least 50% improvement in LBP to allow for decreased pain interference with daily tasks    Status  On-going            Plan - 08/08/17 0941    Clinical Impression Statement  Jamarria doing well today reporting no LBP today however does have some "tenderness" at site of recent biopsy on chest.  Tolerated progression of lumbopelvic strengthening activities on p-ball well today and reports she feels pain levels are improving.  Did discuss need to use proper body mechanics with moving furniture as Ryleigh is moving furniture to ready home for flooring service.  Progressing well toward goals.    PT Treatment/Interventions  Patient/family  education;ADLs/Self Care Home Management;Neuromuscular re-education;Therapeutic exercise;Therapeutic activities;Functional mobility training;Balance training;Manual  techniques;Dry needling;Taping;Electrical Stimulation;Moist Heat;Cryotherapy;Ultrasound;Iontophoresis 60m/ml Dexamethasone;Traction    Consulted and Agree with Plan of Care  Patient       Patient will benefit from skilled therapeutic intervention in order to improve the following deficits and impairments:  Pain, Impaired flexibility, Increased muscle spasms, Decreased range of motion, Decreased strength, Postural dysfunction, Improper body mechanics, Decreased mobility, Decreased activity tolerance  Visit Diagnosis: Chronic right-sided low back pain without sciatica  Muscle spasm of back  Muscle weakness (generalized)     Problem List Patient Active Problem List   Diagnosis Date Noted  . Aortic atherosclerosis ([place] 07/17/2017  . Chest pain 07/16/2017  . Insomnia 01/12/2017  . Hyperglycemia 10/07/2014  . HTN (hypertension) 09/18/2012  . Breast cyst 06/09/2012  . Hyperthyroidism 02/29/2012  . Unspecified constipation 04/18/2011  . Can't get food down 04/18/2011  . Ulnar neuropathy 03/30/2011  . Low back pain 10/18/2010  . FIBROMYALGIA 07/20/2009  . Hyperlipidemia 05/26/2009  . VITAMIN D DEFICIENCY 05/23/2009  . Iron deficiency anemia due to chronic blood loss 10/25/2008  . GERD 10/25/2008  . LIVER HEMANGIOMA 01/01/2008  . ESOPHAGEAL STRICTURE 01/01/2008  . GASTROPARESIS 01/01/2008  . ALLERGIC RHINITIS 12/08/2007  . Borderline type 2 diabetes mellitus 10/31/2007  . Migraine headache 08/11/2007  . HIATAL HERNIA 08/11/2007  . OVARIAN CYST 08/11/2007  . Essential hypertension, benign 05/06/2007    MBess Harvest PTA 08/08/17 12:29 PM   [facility] [address] [place] [address]Phone: [phone]  Fax:  [phone] Name: ABIRDIE FETTYMRN: [id number] of Birth: 329-Feb-1962

## 2017-08-08 NOTE — Patient Instructions (Signed)

## 2017-08-13 ENCOUNTER — Ambulatory Visit: Payer: Medicare PPO | Admitting: Physical Therapy

## 2017-08-15 ENCOUNTER — Encounter: Payer: Self-pay | Admitting: Physical Therapy

## 2017-08-15 ENCOUNTER — Ambulatory Visit: Payer: Medicare PPO | Admitting: Family

## 2017-08-15 ENCOUNTER — Ambulatory Visit (INDEPENDENT_AMBULATORY_CARE_PROVIDER_SITE_OTHER): Payer: Medicare PPO | Admitting: Family

## 2017-08-15 ENCOUNTER — Encounter: Payer: Self-pay | Admitting: Family

## 2017-08-15 ENCOUNTER — Ambulatory Visit: Payer: Medicare PPO | Admitting: Physical Therapy

## 2017-08-15 VITALS — BP 123/73 | HR 72 | Temp 98.8°F | Resp 16 | Ht 64.0 in | Wt 227.0 lb

## 2017-08-15 DIAGNOSIS — M6281 Muscle weakness (generalized): Secondary | ICD-10-CM

## 2017-08-15 DIAGNOSIS — M545 Low back pain, unspecified: Secondary | ICD-10-CM

## 2017-08-15 DIAGNOSIS — L739 Follicular disorder, unspecified: Secondary | ICD-10-CM

## 2017-08-15 DIAGNOSIS — M62830 Muscle spasm of back: Secondary | ICD-10-CM

## 2017-08-15 DIAGNOSIS — G8929 Other chronic pain: Secondary | ICD-10-CM

## 2017-08-15 MED ORDER — CEPHALEXIN 500 MG PO CAPS: 500.0000 mg | ORAL_CAPSULE | Freq: Three times a day (TID) | ORAL | 0 refills | Status: DC

## 2017-08-15 NOTE — Progress Notes (Signed)
Subjective:    Patient ID: [name], female    DOB: [birth year], 57 y.o.   MRN: [id number]  HPI  Patient is a 57 yr old female who presents today with chief complaint of skin infection.  Reports "boil" in the genital area. Reports that she first noticed 4-5 days ago.    Review of Systems See HPI  Past Medical History:  Diagnosis Date  . Allergy    allergic rhinitis  . Arthritis   . Atypical chest pain   . Cancer [facility])    having a biopsy on left breast this friday for abnormal finding  . Constipation   . Cyst, ovarian   . Diabetes mellitus, type II ([place])   . Endometriosis   . Esophageal stricture   . Gastroparesis   . GERD (gastroesophageal reflux disease)   . Heart murmur   . Hyperlipidemia   . Hypertension   . 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 Iron deficiency anemia   . Migraine headache   . Non-compliance   . Peptic ulcer disease   . Thyroid disease      Social History   Socioeconomic History  . Marital status: Widowed    Spouse name: Not on file  . Number of children: 2  . Years of education: Not on file  . Highest education level: Not on file  Social Needs  . Financial resource strain: Not on file  . Food insecurity - worry: Not on file  . Food insecurity - inability: Not on file  . Transportation needs - medical: Not on file  . Transportation needs - non-medical: Not on file  Occupational History  . Occupation: DISABILITY    Employer: UNEMPLOYED  Tobacco Use  . Smoking status: Former Smoker    Packs/day: 0.50    Years: 18.00    Pack years: 9.00    Types: Cigarettes    Start date: 07/29/1977    Last attempt to quit: 06/25/1994    Years since quitting: 23.1  . Smokeless tobacco: Never Used  . Tobacco comment: quit 19 years ago  Substance and Sexual Activity  . Alcohol use: Yes    Alcohol/week: 0.0 oz    Comment: social drinker  . Drug use: No  . Sexual activity: Not Currently    Other Topics Concern  . Not on file  Social History Narrative   Married    Has 2 grown children.  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   Former Smoker Quit tobacco in 1996.  She was a pack a day smoker for approximately 10 years.   Alcohol use-yes: Social    Daily Caffeine Use:6 pack of pepsi daily     Illicit Drug Use - no    Patient does not get regular exercise.       Smoking Status:  quit    Past Surgical History:  Procedure Laterality Date  . ABDOMINAL EXPLORATION SURGERY     w/bso   . CARDIAC CATHETERIZATION  2009   mild non obstructive CAD  . CHOLECYSTECTOMY    . KNEE SURGERY  2005    left knee  . LEFT HEART CATH AND CORONARY ANGIOGRAPHY N/A 07/17/2017   Procedure: LEFT HEART CATH AND CORONARY ANGIOGRAPHY;  Surgeon: Martinique, Peter M, MD;  Location: [facility];  Service: Cardiovascular;  Laterality: N/A;  . TOTAL ABDOMINAL HYSTERECTOMY      Family History  Problem Relation Age of Onset  .  Hypertension Mother   . Rheum arthritis Mother   . Hypertension Father   . Diabetes Father   . Prostate cancer Father   . Kidney disease Father   . Diabetes Sister   . Fibromyalgia Sister   . Diabetes Maternal Grandmother   . Lung cancer Maternal Grandfather   . Colon cancer Neg Hx   . Thyroid disease Neg Hx     Allergies  Allergen Reactions  . Ace Inhibitors     REACTION: chronic cough  . Celebrex [Celecoxib] Other (See Comments)    "makes me bleed"  . Diovan [Valsartan]     angioedema    Current Outpatient Medications on File Prior to Visit  Medication Sig Dispense Refill  . amitriptyline (ELAVIL) 50 MG tablet Take 1 tablet (50 mg total) by mouth at bedtime. 90 tablet 1  . amLODipine (NORVASC) 5 MG tablet Take 1 tablet (5 mg total) by mouth daily. 90 tablet 1  . aspirin 81 MG chewable tablet Chew 1 tablet (81 mg total) by mouth daily.    . hydrochlorothiazide (HYDRODIURIL) 25 MG tablet TAKE 1 TABLET BY MOUTH ONCE DAILY 90 tablet 0  . linaclotide (LINZESS)  145 MCG CAPS capsule Take 1 capsule (145 mcg total) by mouth daily before breakfast. 12 capsule 0  . methimazole (TAPAZOLE) 5 MG tablet Take 1 tablet (5 mg total) 3 (three) times a week by mouth. 12 tablet 5  . methocarbamol (ROBAXIN) 500 MG tablet Take 1 tablet (500 mg total) by mouth every 8 (eight) hours as needed for muscle spasms. 20 tablet 0  . nystatin (MYCOSTATIN/NYSTOP) powder Apply topically 2 (two) times daily. 60 g 0  . potassium chloride SA (K-DUR,KLOR-CON) 20 MEQ tablet Take 1 tablet (20 mEq total) by mouth daily. 30 tablet 5  . rosuvastatin (CRESTOR) 40 MG tablet TAKE 1 TABLET BY MOUTH EVERY OTHER DAY 15 tablet 1  . traZODone (DESYREL) 50 MG tablet Take 0.5-1 tablets (25-50 mg total) by mouth at bedtime as needed for sleep. 30 tablet 3  . [DISCONTINUED] atorvastatin (LIPITOR) 40 MG tablet Take 1 tablet (40 mg total) by mouth daily. 30 tablet 3  . [DISCONTINUED] famotidine (PEPCID) 20 MG tablet Take 1 tablet (20 mg total) by mouth 2 (two) times daily. 30 tablet 0   No current facility-administered medications on file prior to visit.     BP 123/73 (BP Location: Right Arm, Patient Position: Sitting, Cuff Size: Large)   Pulse 72   Temp 98.8 F (37.1 C) (Oral)   Resp 16   Ht 5\' 4"  (1.626 m)   Wt 227 lb (103 kg)   SpO2 97%   BMI 38.96 kg/m       Objective:   Physical Exam  Constitutional: She is oriented to person, place, and time. She appears well-developed and well-nourished.  Genitourinary:  Genitourinary Comments: + tenderness and  noted at base of vulva on right.  No fluctuance or erythema  Neurological: She is alert and oriented to person, place, and time.  Skin: Skin is warm and dry.  Psychiatric: She has a normal mood and affect. Her behavior is normal. Judgment and thought content normal.          Assessment & Plan:  Folliculitis- symptoms consistent with early folliculitis. Rx with keflex. She is advised to call if symptoms worsen or if symptoms fail to  improve.

## 2017-08-15 NOTE — Therapy (Addendum)
[facility] [address]  [place], [place], [address] Phone: [phone]   Fax:  [phone]  Physical Therapy Treatment  Patient Details  Name: [name] MRN: [id number] Date of Birth: 1960-11-09 Referring Provider: [name], NP   Encounter Date: 08/15/2017  PT End of Session - 08/15/17 0936    Visit Number  8    Number of Visits  12    Date for PT Re-Evaluation  08/16/17    Authorization Type  UHC Medicare    PT Start Time  [phone]    PT Stop Time  1012    PT Time Calculation (min)  36 min    Activity Tolerance  Patient tolerated treatment well    Behavior During Therapy  [facility] for tasks assessed/performed       Past Medical History:  Diagnosis Date  . Allergy    allergic rhinitis  . Arthritis   . Atypical chest pain   . Cancer [facility])    having a biopsy on left breast this friday for abnormal finding  . Constipation   . Cyst, ovarian   . Diabetes mellitus, type II ([place])   . Endometriosis   . Esophageal stricture   . Gastroparesis   . GERD (gastroesophageal reflux disease)   . Heart murmur   . Hyperlipidemia   . Hypertension   . 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 Iron deficiency anemia   . Migraine headache   . Non-compliance   . Peptic ulcer disease   . Thyroid disease     Past Surgical History:  Procedure Laterality Date  . ABDOMINAL EXPLORATION SURGERY     w/bso   . CARDIAC CATHETERIZATION  2009   mild non obstructive CAD  . CHOLECYSTECTOMY    . KNEE SURGERY  2005    left knee  . LEFT HEART CATH AND CORONARY ANGIOGRAPHY N/A 07/17/2017   Procedure: LEFT HEART CATH AND CORONARY ANGIOGRAPHY;  Surgeon: Martinique, Peter M, MD;  Location: [facility];  Service: Cardiovascular;  Laterality: N/A;  . TOTAL ABDOMINAL HYSTERECTOMY      There were no vitals filed for this visit.  Subjective Assessment - 08/15/17 0939     Diagnostic tests  No recent imaging; Lumbar MRI 10/18/10: No likely significant finding.  Mild desiccation and bulging of the discs at L4-5 and L5-S1.  No stenosis of the canal or foramina.    Patient Stated Goals  "get a whole lot better"    Currently in Pain?  No/denies    Pain Score  0-No pain         [facility] PT Assessment - 08/15/17 0936      Assessment   Medical Diagnosis  Low back pain w/o sciatica    Referring Provider  [name], NP    Onset Date/Surgical Date  -- several yrs, worsening over past few months    Next MD Visit  09/24/17      Observation/Other Assessments   Focus on Therapeutic Outcomes (FOTO)   Lumbar - 76% (24% limitation)      Strength   Right Hip Flexion  4/5    Right Hip Extension  4/5    Right Hip External Rotation   4/5    Right Hip Internal Rotation  4-/5    Right Hip ABduction  4+/5    Right Hip ADduction  4-/5    Left Hip Flexion  4/5    Left Hip Extension  4/5    Left Hip External Rotation  4/5    Left Hip Internal Rotation  4-/5    Left Hip ABduction  4+/5    Left Hip ADduction  4/5    Right Knee Flexion  4+/5    Right Knee Extension  4+/5    Left Knee Flexion  4+/5    Left Knee Extension  4+/5                  [facility] Adult PT Treatment/Exercise - 08/15/17 0936      Exercises   Exercises  Lumbar      Lumbar Exercises: Aerobic   Nustep  L5 x 6' (LE only)      Knee/Hip Exercises: Standing   Hip Flexion  Both;10 reps;Knee straight;Stengthening    Hip Flexion Limitations  looped green TB    Hip Abduction  Both;10 reps;Knee straight;Stengthening    Abduction Limitations  looped green TB    Hip Extension  Both;10 reps;Knee straight;Stengthening    Extension Limitations  looped green TB               PT Short Term Goals - 07/11/17 1119      PT SHORT TERM GOAL #1   Title  Independent with initial HEP    Status  Achieved        PT Long Term Goals - 08/15/17 0941      PT LONG TERM GOAL #1   Title   Independent with ongoing HEP +/- gym program    Status  Achieved      PT LONG TERM GOAL #2   Title  Pt will verbalize/demonstrate understanding of neutral spine posture and proper body mechanics for typical daily tasks to reduce limbar strain    Status  Achieved      PT LONG TERM GOAL #3   Title  B LE strength >/= 4/5 for improved function    Status  Partially Met      PT LONG TERM GOAL #4   Title  Pt will report at least 50% improvement in LBP to allow for decreased pain interference with daily tasks    Status  Achieved            Plan - 08/15/17 0941    Clinical Impression Statement  [name] is very pleased with her progress with PT, noting near 100% improvement in her LBP and now able to resume majority of normal daily activities w/o limitation due to low back. B LE strength significantly improved as well as awareness of core activation/stabilization allowing for increased activity tolerance.. All goals essentially met at this time and pt feels confident transitioning to HEP at this ttime, therefore will place pt on 30 day hold.    Rehab Potential  Good    PT Treatment/Interventions  Patient/family education;ADLs/Self Care Home Management;Neuromuscular re-education;Therapeutic exercise;Therapeutic activities;Functional mobility training;Balance training;Manual techniques;Dry needling;Taping;Electrical Stimulation;Moist Heat;Cryotherapy;Ultrasound;Iontophoresis 63m/ml Dexamethasone;Traction    PT Next Visit Plan  30 day hold    Consulted and Agree with Plan of Care  Patient       Patient will benefit from skilled therapeutic intervention in order to improve the following deficits and impairments:  Pain, Impaired flexibility, Increased muscle spasms, Decreased range of motion, Decreased strength, Postural dysfunction, Improper body mechanics, Decreased mobility, Decreased activity tolerance  Visit Diagnosis: Chronic right-sided low back pain without sciatica  Muscle spasm of  back  Muscle weakness (generalized)  Problem List Patient Active Problem List   Diagnosis Date Noted  . Aortic atherosclerosis ([place]) 07/17/2017  . Chest pain 07/16/2017  . Insomnia 01/12/2017  . Hyperglycemia 10/07/2014  . HTN (hypertension) 09/18/2012  . Breast cyst 06/09/2012  . Hyperthyroidism 02/29/2012  . Unspecified constipation 04/18/2011  . Can't get food down 04/18/2011  . Ulnar neuropathy 03/30/2011  . Low back pain 10/18/2010  . FIBROMYALGIA 07/20/2009  . Hyperlipidemia 05/26/2009  . VITAMIN D DEFICIENCY 05/23/2009  . Iron deficiency anemia due to chronic blood loss 10/25/2008  . GERD 10/25/2008  . LIVER HEMANGIOMA 01/01/2008  . ESOPHAGEAL STRICTURE 01/01/2008  . GASTROPARESIS 01/01/2008  . ALLERGIC RHINITIS 12/08/2007  . Borderline type 2 diabetes mellitus 10/31/2007  . Migraine headache 08/11/2007  . HIATAL HERNIA 08/11/2007  . OVARIAN CYST 08/11/2007  . Essential hypertension, benign 05/06/2007    Percival Spanish, PT, MPT 08/15/2017, 1:35 PM  [facility] [address]  [place], [place], [address] Phone: [phone]   Fax:  [phone]  Name: [name] MRN: [id number] Date of Birth: Aug 04, 1960  PHYSICAL THERAPY DISCHARGE SUMMARY  Visits from Start of Care: 8  Current functional level related to goals / functional outcomes:   Refer to above clinical impression for status as of last visit on 08/15/17. Pt was placed on hold for 30 days and has not needed to return to PT, therefore will proceed with discharge from PT for this episode.   Remaining deficits:   As above.   Education / Equipment:   HEP  Plan: Patient agrees to discharge.  Patient goals were met. Patient is being discharged due to meeting the stated rehab goals.  ?????    Percival Spanish, PT, MPT 10/07/17, 9:26 AM  [facility] [address]  [place], [place], [address] Phone: [phone]   Fax:  [phone]

## 2017-08-15 NOTE — Patient Instructions (Signed)
Please begin keflex (antibiotic). Call if new/worsening symptoms or if not improved in 3 days.

## 2017-08-26 ENCOUNTER — Encounter: Payer: Self-pay | Admitting: Internal Medicine

## 2017-08-26 ENCOUNTER — Ambulatory Visit (INDEPENDENT_AMBULATORY_CARE_PROVIDER_SITE_OTHER): Payer: Medicare PPO | Admitting: Internal Medicine

## 2017-08-26 VITALS — BP 126/70 | HR 77 | Ht 64.0 in | Wt 225.6 lb

## 2017-08-26 DIAGNOSIS — I10 Essential (primary) hypertension: Secondary | ICD-10-CM | POA: Diagnosis not present

## 2017-08-26 DIAGNOSIS — E785 Hyperlipidemia, unspecified: Secondary | ICD-10-CM | POA: Diagnosis not present

## 2017-08-26 DIAGNOSIS — D500 Iron deficiency anemia secondary to blood loss (chronic): Secondary | ICD-10-CM

## 2017-08-26 DIAGNOSIS — R5383 Other fatigue: Secondary | ICD-10-CM

## 2017-08-26 DIAGNOSIS — R0683 Snoring: Secondary | ICD-10-CM

## 2017-08-26 DIAGNOSIS — I2510 Atherosclerotic heart disease of native coronary artery without angina pectoris: Secondary | ICD-10-CM | POA: Diagnosis not present

## 2017-08-26 DIAGNOSIS — E559 Vitamin D deficiency, unspecified: Secondary | ICD-10-CM

## 2017-08-26 DIAGNOSIS — I700 Atherosclerosis of aorta: Secondary | ICD-10-CM | POA: Diagnosis not present

## 2017-08-26 LAB — LIPID PANEL
Chol/HDL Ratio: 4.1 ratio (ref 0.0–4.4)
Cholesterol, Total: 195 mg/dL (ref 100–199)
HDL: 48 mg/dL (ref >39)
LDL Calculated: 123 mg/dL — ABNORMAL HIGH (ref 0–99)
Triglycerides: 120 mg/dL (ref 0–149)
VLDL Cholesterol Cal: 24 mg/dL (ref 5–40)

## 2017-08-26 LAB — CBC
Hematocrit: 34.1 % (ref 34.0–46.6)
Hemoglobin: 11.4 g/dL (ref 11.1–15.9)
MCH: 23.5 pg — ABNORMAL LOW (ref 26.6–33.0)
MCHC: 33.4 g/dL (ref 31.5–35.7)
MCV: 70 fL — ABNORMAL LOW (ref 79–97)
Platelets: 254 10*3/uL (ref 150–379)
RBC: 4.86 x10E6/uL (ref 3.77–5.28)
RDW: 13.8 % (ref 12.3–15.4)
WBC: 8.9 10*3/uL (ref 3.4–10.8)

## 2017-08-26 LAB — BASIC METABOLIC PANEL
BUN/Creatinine Ratio: 17 (ref 9–23)
BUN: 15 mg/dL (ref 6–24)
CO2: 26 mmol/L (ref 20–29)
Calcium: 9.5 mg/dL (ref 8.7–10.2)
Chloride: 99 mmol/L (ref 96–106)
Creatinine, Ser: 0.89 mg/dL (ref 0.57–1.00)
GFR calc Af Amer: 83 mL/min/{1.73_m2} (ref >59)
GFR calc non Af Amer: 72 mL/min/{1.73_m2} (ref >59)
Glucose: 93 mg/dL (ref 65–99)
Potassium: 3.4 mmol/L — ABNORMAL LOW (ref 3.5–5.2)
Sodium: 140 mmol/L (ref 134–144)

## 2017-08-26 LAB — IRON AND TIBC
Iron Saturation: 30 % (ref 15–55)
Iron: 86 ug/dL (ref 27–159)
Total Iron Binding Capacity: 285 ug/dL (ref 250–450)
UIBC: 199 ug/dL (ref 131–425)

## 2017-08-26 LAB — VITAMIN B12: Vitamin B-12: 535 pg/mL (ref 232–1245)

## 2017-08-26 LAB — FOLATE: Folate: 7 ng/mL (ref >3.0)

## 2017-08-26 LAB — FERRITIN: Ferritin: 1261 ng/mL — ABNORMAL HIGH (ref 15–150)

## 2017-08-26 NOTE — Patient Instructions (Signed)
Your physician recommends that you continue on your current medications as directed. Please refer to the Current Medication list given to you today.  Your physician has recommended that you have a sleep study. This test records several body functions during sleep, including: brain activity, eye movement, oxygen and carbon dioxide blood levels, heart rate and rhythm, breathing rate and rhythm, the flow of air through your mouth and nose, snoring, body muscle movements, and chest and belly movement.  Your physician recommends that you return for lab work in: TODAY (anemia panel, lipids, cbc, bmet)  Your physician wants you to follow-up in: 6 months with Dr. Harrington Challenger.  You will receive a reminder letter in the mail two months in advance. If you don't receive a letter, please call our office to schedule the follow-up appointment.

## 2017-08-26 NOTE — Progress Notes (Signed)
Cardiology Office Note   Date:  08/26/2017   ID:  [name], DOB Aug 10, 1960, MRN [id number]  PCP:  [name], NP  Cardiologist:   [name], MD   F/U of CAD    History of Present Illness: [name] is a 57 y.o. female with a history of CAD Minimal CAD at cath 2009  FHx of CAD  Remote tobacco  Seen by Kathleen Argue in 2013 She wa admitted in Jan with CP and vomiting  After outpt EGD/dilitation  Trop positive  Cardiac cath done showed 75% mid PDA (small vessel)  Prox RCA with 25%  OM 50%  LVEF normal  Focal wall motion abnormality  ? Effect of spasm    Since discharge she has felt  A little tired overall  Walks but says she lacks energy Family says she snores    Current Meds  Medication Sig  . amitriptyline (ELAVIL) 50 MG tablet Take 1 tablet (50 mg total) by mouth at bedtime.  Marland Kitchen amLODipine (NORVASC) 5 MG tablet Take 1 tablet (5 mg total) by mouth daily.  Marland Kitchen aspirin 81 MG chewable tablet Chew 1 tablet (81 mg total) by mouth daily.  . cephALEXin (KEFLEX) 500 MG capsule Take 1 capsule (500 mg total) by mouth 3 (three) times daily.  . hydrochlorothiazide (HYDRODIURIL) 25 MG tablet TAKE 1 TABLET BY MOUTH ONCE DAILY  . linaclotide (LINZESS) 145 MCG CAPS capsule Take 1 capsule (145 mcg total) by mouth daily before breakfast.  . methimazole (TAPAZOLE) 5 MG tablet Take 1 tablet (5 mg total) 3 (three) times a week by mouth.  . methocarbamol (ROBAXIN) 500 MG tablet Take 1 tablet (500 mg total) by mouth every 8 (eight) hours as needed for muscle spasms.  Marland Kitchen nystatin (MYCOSTATIN/NYSTOP) powder Apply topically 2 (two) times daily.  . potassium chloride SA (K-DUR,KLOR-CON) 20 MEQ tablet Take 1 tablet (20 mEq total) by mouth daily.  . rosuvastatin (CRESTOR) 40 MG tablet TAKE 1 TABLET BY MOUTH EVERY OTHER DAY  . traZODone (DESYREL) 50 MG tablet Take 0.5-1 tablets (25-50 mg total) by mouth at bedtime as needed for sleep.  . [DISCONTINUED] atorvastatin (LIPITOR) 40 MG tablet Take 1  tablet (40 mg total) by mouth daily.  . [DISCONTINUED] famotidine (PEPCID) 20 MG tablet Take 1 tablet (20 mg total) by mouth 2 (two) times daily.     Allergies:   Ace inhibitors; Celebrex [celecoxib]; and Diovan [valsartan]   Past Medical History:  Diagnosis Date  . Allergy    allergic rhinitis  . Arthritis   . Atypical chest pain   . Cancer [facility])    having a biopsy on left breast this friday for abnormal finding  . Constipation   . Cyst, ovarian   . Diabetes mellitus, type II ([place])   . Endometriosis   . Esophageal stricture   . Gastroparesis   . GERD (gastroesophageal reflux disease)   . Heart murmur   . Hyperlipidemia   . Hypertension   . 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 Iron deficiency anemia   . Migraine headache   . Non-compliance   . Peptic ulcer disease   . Thyroid disease     Past Surgical History:  Procedure Laterality Date  . ABDOMINAL EXPLORATION SURGERY     w/bso   . CARDIAC CATHETERIZATION  2009   mild non obstructive CAD  . CHOLECYSTECTOMY    . KNEE SURGERY  2005  left knee  . LEFT HEART CATH AND CORONARY ANGIOGRAPHY N/A 07/17/2017   Procedure: LEFT HEART CATH AND CORONARY ANGIOGRAPHY;  Surgeon: Martinique, Peter M, MD;  Location: [place] [facility];  Service: Cardiovascular;  Laterality: N/A;  . TOTAL ABDOMINAL HYSTERECTOMY       Social History:  The patient  reports that she quit smoking about 23 years ago. Her smoking use included cigarettes. She started smoking about 40 years ago. She has a 9.00 pack-year smoking history. she has never used smokeless tobacco. She reports that she drinks alcohol. She reports that she does not use drugs.   Family History:  The patient's family history includes Diabetes in her father, maternal grandmother, and sister; Fibromyalgia in her sister; Hypertension in her father and mother; Kidney disease in her father; Lung cancer in her maternal grandfather;  Prostate cancer in her father; Rheum arthritis in her mother.    ROS:  Please see the history of present illness. All other systems are reviewed and  Negative to the above problem except as noted.    PHYSICAL EXAM: VS:  BP 126/70   Pulse 77   Ht 5\' 4"  (1.626 m)   Wt 225 lb 9.6 oz (102.3 kg)   SpO2 98%   BMI 38.72 kg/m   GEN: Well nourished, well developed, in no acute distress  HEENT: normal  Neck: no JVD, carotid bruits, or masses Cardiac: RRR; no murmurs, rubs, or gallops,no edema  Respiratory:  clear to auscultation bilaterally, normal work of breathing GI: soft, nontender, nondistended, + BS  No hepatomegaly  MS: no deformity Moving all extremities   Skin: warm and dry, no rash Neuro:  Strength and sensation are intact Psych: euthymic mood, full affect   EKG:  EKG is not ordered today.   Lipid Panel    Component Value Date/Time   CHOL 263 (H) 07/17/2017 1507   TRIG 131 07/17/2017 1507   TRIG 103 04/30/2006 0949   HDL 39 (L) 07/17/2017 1507   CHOLHDL 6.7 07/17/2017 1507   VLDL 26 07/17/2017 1507   LDLCALC 198 (H) 07/17/2017 1507   LDLDIRECT 131.0 10/31/2016 1555      Wt Readings from Last 3 Encounters:  08/26/17 225 lb 9.6 oz (102.3 kg)  08/15/17 227 lb (103 kg)  07/29/17 225 lb (102.1 kg)      ASSESSMENT AND PLAN:  1  CAD  Recent hosp with elev troponin  No culprit vessel  Some wall motion changes at cath but echo normal  ? Spasm  Recomm risk factor modification Keep on asa and optimize lipids  2  HL  Profound  LDL last 198  Now on crestor 40  Check today  3  ANemia  Check CBC and anemia panel  4  GI  Hx stricture s/p dilitation  5  HTN  BP good  Check BMET  6  ? Sleep apnea  Set for sleep study.    F/U in 6 months     Current medicines are reviewed at length with the patient today.  The patient does not have concerns regarding medicines.  Signed, [name], MD  08/26/2017 10:46 AM    [address],  [place], [place]  [address] Phone: [phone]; Fax: [phone]

## 2017-08-30 ENCOUNTER — Telehealth: Payer: Self-pay | Admitting: *Deleted

## 2017-08-30 MED ORDER — EZETIMIBE 10 MG PO TABS: 10.0000 mg | ORAL_TABLET | Freq: Every day | ORAL | 3 refills | Status: DC

## 2017-08-30 MED ORDER — POTASSIUM CHLORIDE ER 10 MEQ PO TBCR: 10.0000 meq | EXTENDED_RELEASE_TABLET | Freq: Every day | ORAL | 3 refills | Status: DC

## 2017-08-30 NOTE — Telephone Encounter (Signed)
-----   Message from Fay Records, MD sent at 08/27/2017  4:54 PM EST ----- Lipids are still not optimal with mild CAD  Great improvement from baseline 265    Add Zetia  10 mg  Continue Crestor 40    CBC :  Hgb is improved  Electrolytes:   K slightly low  Kidney funciton normal    Add 10 mEq Potassium to regimen  Ferritin is elevated   Make sure not on iron supplemtn

## 2017-08-30 NOTE — Telephone Encounter (Signed)
Spoke with patient. She will make recommended med changes. She is not currently on iron supplements. Has gotten iron infusions in past w/ Dr. Marin Olp.  Not in at least last several months per patient.

## 2017-09-23 ENCOUNTER — Other Ambulatory Visit: Payer: Self-pay | Admitting: *Deleted

## 2017-09-24 ENCOUNTER — Telehealth: Payer: Self-pay | Admitting: Family

## 2017-09-24 ENCOUNTER — Ambulatory Visit (INDEPENDENT_AMBULATORY_CARE_PROVIDER_SITE_OTHER): Payer: Medicare PPO | Admitting: Family

## 2017-09-24 ENCOUNTER — Encounter: Payer: Self-pay | Admitting: Family

## 2017-09-24 ENCOUNTER — Ambulatory Visit: Payer: Medicare Other | Admitting: Family

## 2017-09-24 VITALS — BP 138/79 | HR 64 | Temp 98.7°F | Resp 16 | Ht 64.5 in | Wt 225.0 lb

## 2017-09-24 DIAGNOSIS — R9431 Abnormal electrocardiogram [ECG] [EKG]: Secondary | ICD-10-CM | POA: Diagnosis not present

## 2017-09-24 DIAGNOSIS — I252 Old myocardial infarction: Secondary | ICD-10-CM | POA: Diagnosis not present

## 2017-09-24 DIAGNOSIS — Z0000 Encounter for general adult medical examination without abnormal findings: Secondary | ICD-10-CM | POA: Diagnosis not present

## 2017-09-24 DIAGNOSIS — I10 Essential (primary) hypertension: Secondary | ICD-10-CM

## 2017-09-24 DIAGNOSIS — D649 Anemia, unspecified: Secondary | ICD-10-CM | POA: Diagnosis not present

## 2017-09-24 DIAGNOSIS — E876 Hypokalemia: Secondary | ICD-10-CM

## 2017-09-24 LAB — COMPREHENSIVE METABOLIC PANEL
ALT: 18 U/L (ref 0–35)
AST: 16 U/L (ref 0–37)
Albumin: 3.7 g/dL (ref 3.5–5.2)
Alkaline Phosphatase: 113 U/L (ref 39–117)
BUN: 12 mg/dL (ref 6–23)
CO2: 30 mEq/L (ref 19–32)
Calcium: 9.2 mg/dL (ref 8.4–10.5)
Chloride: 99 mEq/L (ref 96–112)
Creatinine, Ser: 0.76 mg/dL (ref 0.40–1.20)
GFR: 100.16 mL/min (ref >60.00)
Glucose, Bld: 118 mg/dL — ABNORMAL HIGH (ref 70–99)
Potassium: 2.7 mEq/L — CL (ref 3.5–5.1)
Sodium: 137 mEq/L (ref 135–145)
Total Bilirubin: 0.3 mg/dL (ref 0.2–1.2)
Total Protein: 7.4 g/dL (ref 6.0–8.3)

## 2017-09-24 LAB — CBC WITH DIFFERENTIAL/PLATELET
Basophils Absolute: 0 10*3/uL (ref 0.0–0.1)
Basophils Relative: 0.3 % (ref 0.0–3.0)
Eosinophils Absolute: 0 10*3/uL (ref 0.0–0.7)
Eosinophils Relative: 0.6 % (ref 0.0–5.0)
HCT: 36.1 % (ref 36.0–46.0)
Hemoglobin: 11.4 g/dL — ABNORMAL LOW (ref 12.0–15.0)
Lymphocytes Relative: 34.2 % (ref 12.0–46.0)
Lymphs Abs: 2.7 10*3/uL (ref 0.7–4.0)
MCHC: 31.5 g/dL (ref 30.0–36.0)
MCV: 72.7 fl — ABNORMAL LOW (ref 78.0–100.0)
Monocytes Absolute: 0.4 10*3/uL (ref 0.1–1.0)
Monocytes Relative: 5.7 % (ref 3.0–12.0)
Neutro Abs: 4.6 10*3/uL (ref 1.4–7.7)
Neutrophils Relative %: 59.2 % (ref 43.0–77.0)
Platelets: 242 10*3/uL (ref 150.0–400.0)
RBC: 4.97 Mil/uL (ref 3.87–5.11)
RDW: 14 % (ref 11.5–15.5)
WBC: 7.8 10*3/uL (ref 4.0–10.5)

## 2017-09-24 NOTE — Patient Instructions (Signed)
Please complete lab work prior to leaving. Work on [employer], regular exercise and weight loss. Complete lab work prior to leaving. Check with your pharmacy to see if they have the shingles vaccine (shingrix).

## 2017-09-24 NOTE — Telephone Encounter (Signed)
Notified pt and she voices understanding. Lab appt scheduled for tomorrow at 3pm and future lab order has been entered.

## 2017-09-24 NOTE — Telephone Encounter (Signed)
Potassium is very low.  Need to take 2 tabs Kdur now, 2 tabs in 4 hours and 2 tabs tomorrow AM, then one tab once daily. Repeat bmet tomorrow afternoon. Dx hypokalemia.

## 2017-09-24 NOTE — Progress Notes (Signed)
Subjective:    Patient ID: [name], female    DOB: [birth year], 59 y.o.   MRN: [id number]  HPI  Patient presents today for complete physical.  Immunizations: Tdap 2012 Diet: diet is fair Exercise:  walks Colonoscopy: 07/16/17 Dexa: 05/07/16  Pap Smear: hysterectomy Mammogram: 07/06/17 (abnormal) had breast biopsy= "fat necrosis" Dental: up to date Vision: reports up to date Wt Readings from Last 3 Encounters:  09/24/17 225 lb (102.1 kg)  08/26/17 225 lb 9.6 oz (102.3 kg)  08/15/17 227 lb (103 kg)      Review of Systems  Constitutional: Negative for unexpected weight change.  HENT: Negative for hearing loss and rhinorrhea.   Eyes: Negative for visual disturbance.  Respiratory: Negative for cough.   Cardiovascular: Negative for leg swelling.  Gastrointestinal: Positive for constipation. Negative for diarrhea.       Working with GI re: constipation, reports that she has had some blood on tissue and some in the commode  Genitourinary: Negative for dysuria, frequency and hematuria.  Musculoskeletal: Positive for arthralgias and back pain.  Skin: Negative for rash.  Neurological:       Reports chronic headaches  Hematological: Negative for adenopathy.  Psychiatric/Behavioral:       Denies depression/anxiety       Past Medical History:  Diagnosis Date  . Allergy    allergic rhinitis  . Arthritis   . Atypical chest pain   . Cancer [facility])    having a biopsy on left breast this friday for abnormal finding  . Constipation   . Cyst, ovarian   . Diabetes mellitus, type II ([place])   . Endometriosis   . Esophageal stricture   . Gastroparesis   . GERD (gastroesophageal reflux disease)   . Heart murmur   . Hyperlipidemia   . Hypertension   . 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 Iron deficiency anemia   . Migraine headache   . Non-compliance   . Peptic ulcer disease   . Thyroid disease      Social  History   Socioeconomic History  . Marital status: Widowed    Spouse name: Not on file  . Number of children: 2  . Years of education: Not on file  . Highest education level: Not on file  Occupational History  . Occupation: DISABILITY    Employer: UNEMPLOYED  Social Needs  . Financial resource strain: Not on file  . Food insecurity:    Worry: Not on file    Inability: Not on file  . Transportation needs:    Medical: Not on file    Non-medical: Not on file  Tobacco Use  . Smoking status: Former Smoker    Packs/day: 0.50    Years: 18.00    Pack years: 9.00    Types: Cigarettes    Start date: 07/29/1977    Last attempt to quit: 06/25/1994    Years since quitting: 23.2  . Smokeless tobacco: Never Used  . Tobacco comment: quit 19 years ago  Substance and Sexual Activity  . Alcohol use: Yes    Alcohol/week: 0.0 oz    Comment: social drinker  . Drug use: No  . Sexual activity: Not Currently  Lifestyle  . Physical activity:    Days per week: Not on file    Minutes per session: Not on file  . Stress: Not on file  Relationships  . Social connections:    Talks on phone: Not  on file    Gets together: Not on file    Attends religious service: Not on file    Active member of club or organization: Not on file    Attends meetings of clubs or organizations: Not on file    Relationship status: Not on file  . Intimate partner violence:    Fear of current or ex partner: Not on file    Emotionally abused: Not on file    Physically abused: Not on file    Forced sexual activity: Not on file  Other Topics Concern  . Not on file  Social History Narrative   Married    Has 2 grown children.  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   Former Smoker Quit tobacco in 1996.  She was a pack a day smoker for approximately 10 years.   Alcohol use-yes: Social    Daily Caffeine Use:6 pack of pepsi daily     Illicit Drug Use - no    Patient does not get regular exercise.       Smoking Status:   quit    Past Surgical History:  Procedure Laterality Date  . ABDOMINAL EXPLORATION SURGERY     w/bso   . CARDIAC CATHETERIZATION  2009   mild non obstructive CAD  . CHOLECYSTECTOMY    . KNEE SURGERY  2005    left knee  . LEFT HEART CATH AND CORONARY ANGIOGRAPHY N/A 07/17/2017   Procedure: LEFT HEART CATH AND CORONARY ANGIOGRAPHY;  Surgeon: Martinique, Peter M, MD;  Location: [place] [facility];  Service: Cardiovascular;  Laterality: N/A;  . TOTAL ABDOMINAL HYSTERECTOMY      Family History  Problem Relation Age of Onset  . Hypertension Mother   . Rheum arthritis Mother   . Hypertension Father   . Diabetes Father   . Prostate cancer Father   . Kidney disease Father   . Diabetes Sister   . Fibromyalgia Sister   . Diabetes Maternal Grandmother   . Lung cancer Maternal Grandfather   . Colon cancer Neg Hx   . Thyroid disease Neg Hx     Allergies  Allergen Reactions  . Ace Inhibitors     REACTION: chronic cough  . Celebrex [Celecoxib] Other (See Comments)    "makes me bleed"  . Diovan [Valsartan]     angioedema    Current Outpatient Medications on File Prior to Visit  Medication Sig Dispense Refill  . amitriptyline (ELAVIL) 50 MG tablet Take 1 tablet (50 mg total) by mouth at bedtime. 90 tablet 1  . amLODipine (NORVASC) 5 MG tablet Take 1 tablet (5 mg total) by mouth daily. 90 tablet 1  . aspirin 81 MG chewable tablet Chew 1 tablet (81 mg total) by mouth daily.    Marland Kitchen ezetimibe (ZETIA) 10 MG tablet Take 1 tablet (10 mg total) by mouth daily. 90 tablet 3  . hydrochlorothiazide (HYDRODIURIL) 25 MG tablet TAKE 1 TABLET BY MOUTH ONCE DAILY 90 tablet 0  . linaclotide (LINZESS) 145 MCG CAPS capsule Take 1 capsule (145 mcg total) by mouth daily before breakfast. 12 capsule 0  . methimazole (TAPAZOLE) 5 MG tablet Take 1 tablet (5 mg total) 3 (three) times a week by mouth. 12 tablet 5  . methocarbamol (ROBAXIN) 500 MG tablet Take 1 tablet (500 mg total) by mouth every 8 (eight) hours as  needed for muscle spasms. 20 tablet 0  . nystatin (MYCOSTATIN/NYSTOP) powder Apply topically 2 (two) times daily. 60 g 0  . potassium  chloride (K-DUR) 10 MEQ tablet Take 1 tablet (10 mEq total) by mouth daily. 90 tablet 3  . potassium chloride SA (K-DUR,KLOR-CON) 20 MEQ tablet Take 1 tablet (20 mEq total) by mouth daily. 30 tablet 5  . rosuvastatin (CRESTOR) 40 MG tablet TAKE 1 TABLET BY MOUTH EVERY OTHER DAY 15 tablet 1  . traZODone (DESYREL) 50 MG tablet Take 0.5-1 tablets (25-50 mg total) by mouth at bedtime as needed for sleep. 30 tablet 3  . [DISCONTINUED] atorvastatin (LIPITOR) 40 MG tablet Take 1 tablet (40 mg total) by mouth daily. 30 tablet 3  . [DISCONTINUED] famotidine (PEPCID) 20 MG tablet Take 1 tablet (20 mg total) by mouth 2 (two) times daily. 30 tablet 0   No current facility-administered medications on file prior to visit.     BP 138/79 (BP Location: Right Arm, Patient Position: Sitting, Cuff Size: Large)   Pulse 64   Temp 98.7 F (37.1 C) (Oral)   Resp 16   Ht 5' 4.5" (1.638 m)   Wt 225 lb (102.1 kg)   SpO2 100%   BMI 38.02 kg/m    Objective:   Physical Exam  Physical Exam  Constitutional: overweight AA female. She is oriented to person, place, and time. She appears well-developed and well-nourished. No distress.  HENT:  Head: Normocephalic and atraumatic.  Right Ear: Tympanic membrane and ear canal normal.  Left Ear: Tympanic membrane and ear canal normal.  Mouth/Throat: Oropharynx is clear and moist.  Eyes: Pupils are equal, round, and reactive to light. No scleral icterus.  Neck: Normal range of motion. No thyromegaly present.  Cardiovascular: Normal rate and regular rhythm.   No murmur heard. Pulmonary/Chest: Effort normal and breath sounds normal. No respiratory distress. He has no wheezes. She has no rales. She exhibits no tenderness.  Abdominal: Soft. Bowel sounds are normal. She exhibits no distension and no mass. There is no tenderness. There is no  rebound and no guarding.  Musculoskeletal: She exhibits no edema.  Lymphadenopathy:    She has no cervical adenopathy.  Neurological: She is alert and oriented to person, place, and time. She has normal patellar reflexes. She exhibits normal muscle tone. Coordination normal.  Skin: Skin is warm and dry.  Psychiatric: She has a normal mood and affect. Her behavior is normal. Judgment and thought content normal.  Breasts: Examined lying Right: Without masses, retractions, discharge or axillary adenopathy.  Left: Without masses, retractions, discharge or axillary adenopathy.  Pelvic: deferred         Assessment & Plan:   Preventative care- discussed healthy diet, exercise, weight loss.  Obtain follow up labs. mammo up to date, colo up to date. Due for shingrix which is out of stock.  Advised her to check with her pharmacy.  EKG is personally reviewed. Notes NSR, a lot of artifact noted but appears essentially unchanged from prior EKG.     Assessment & Plan:

## 2017-09-25 ENCOUNTER — Telehealth: Payer: Self-pay | Admitting: *Deleted

## 2017-09-25 ENCOUNTER — Other Ambulatory Visit (INDEPENDENT_AMBULATORY_CARE_PROVIDER_SITE_OTHER): Payer: Medicare PPO

## 2017-09-25 DIAGNOSIS — E876 Hypokalemia: Secondary | ICD-10-CM

## 2017-09-25 NOTE — Telephone Encounter (Signed)
Sent to precert 

## 2017-09-25 NOTE — Telephone Encounter (Signed)
-----   Message from Rodman Key, RN sent at 09/23/2017  2:13 PM EDT ----- Regarding: sleep study Hey there, I was checking for an update on patient's sleep study.  Thanks, [employer]

## 2017-09-26 ENCOUNTER — Telehealth: Payer: Self-pay | Admitting: *Deleted

## 2017-09-26 DIAGNOSIS — E876 Hypokalemia: Secondary | ICD-10-CM

## 2017-09-26 LAB — BASIC METABOLIC PANEL
BUN: 10 mg/dL (ref 6–23)
CO2: 29 mEq/L (ref 19–32)
Calcium: 9.3 mg/dL (ref 8.4–10.5)
Chloride: 97 mEq/L (ref 96–112)
Creatinine, Ser: 0.78 mg/dL (ref 0.40–1.20)
GFR: 97.2 mL/min (ref >60.00)
Glucose, Bld: 114 mg/dL — ABNORMAL HIGH (ref 70–99)
Potassium: 3.5 mEq/L (ref 3.5–5.1)
Sodium: 136 mEq/L (ref 135–145)

## 2017-09-26 NOTE — Telephone Encounter (Signed)
Note sent to Gae Bon okay to schedule Sleep study. Per [employer] no PA needed. Spoke withTillie Rung B. 09/26/17 @ 3:11 PM.) She said to use her name, date and time as reference information.

## 2017-09-26 NOTE — Addendum Note (Signed)
Addended by: Kelle Darting A on: 09/26/2017 05:06 PM   Modules accepted: Orders

## 2017-09-26 NOTE — Telephone Encounter (Signed)
Notified pt and she voices understanding. Scheduled lab appt for 10/03/17 at 7:30am. Future lab order entered.

## 2017-09-26 NOTE — Telephone Encounter (Signed)
Potassium is low normal.  I would like her to restart potassium 33mEQ once daily.  Repeat bmet in 1 week.   Dx hypokalemia.

## 2017-09-30 ENCOUNTER — Telehealth: Payer: Self-pay | Admitting: *Deleted

## 2017-09-30 DIAGNOSIS — R5383 Other fatigue: Secondary | ICD-10-CM

## 2017-09-30 NOTE — Telephone Encounter (Signed)
Patient is scheduled for lab study on Sunday October 20 2017. Patient understands her sleep study will be done at [facility] sleep lab. Left detailed message on voicemail with date and time of titration and informed patient to call back to confirm or reschedule.

## 2017-09-30 NOTE — Telephone Encounter (Signed)
-----   Message from Lauralee Evener, [place] sent at 09/26/2017  3:14 PM EDT ----- Regarding: RE: pre cert Okay to schedule. Per [employer] no PA needed. ----- Message ----- From: Freada Bergeron, CMA Sent: 09/25/2017   5:52 PM To: [facility] Sleep Studies Subject: pre cert                                         ----- Message ----- From: Rodman Key, RN Sent: 09/23/2017   2:13 PM To: Freada Bergeron, CMA, Rodman Key, RN Subject: sleep study                                    Hey there, I was checking for an update on patient's sleep study.  Thanks, [employer]

## 2017-10-03 ENCOUNTER — Other Ambulatory Visit: Payer: Medicare PPO

## 2017-10-04 ENCOUNTER — Telehealth: Payer: Self-pay | Admitting: *Deleted

## 2017-10-04 ENCOUNTER — Other Ambulatory Visit (INDEPENDENT_AMBULATORY_CARE_PROVIDER_SITE_OTHER): Payer: Medicare PPO

## 2017-10-04 DIAGNOSIS — E876 Hypokalemia: Secondary | ICD-10-CM | POA: Diagnosis not present

## 2017-10-04 LAB — BASIC METABOLIC PANEL
BUN: 11 mg/dL (ref 6–23)
CO2: 28 mEq/L (ref 19–32)
Calcium: 8.8 mg/dL (ref 8.4–10.5)
Chloride: 103 mEq/L (ref 96–112)
Creatinine, Ser: 0.82 mg/dL (ref 0.40–1.20)
GFR: 91.75 mL/min (ref >60.00)
Glucose, Bld: 120 mg/dL — ABNORMAL HIGH (ref 70–99)
Potassium: 3.5 mEq/L (ref 3.5–5.1)
Sodium: 140 mEq/L (ref 135–145)

## 2017-10-04 MED ORDER — ROSUVASTATIN CALCIUM 40 MG PO TABS: 40.0000 mg | ORAL_TABLET | ORAL | 1 refills | Status: DC

## 2017-10-04 NOTE — Telephone Encounter (Signed)
Received fax from [facility] requesting 90 day supply of rosuvastatin. Refill sent.

## 2017-10-04 NOTE — Addendum Note (Signed)
Addended by: Caffie Pinto on: 10/04/2017 07:21 AM   Modules accepted: Orders

## 2017-10-07 ENCOUNTER — Ambulatory Visit: Payer: BC Managed Care – PPO | Admitting: Endocrinology

## 2017-10-07 DIAGNOSIS — Z2089 Contact with and (suspected) exposure to other communicable diseases: Secondary | ICD-10-CM

## 2017-10-20 ENCOUNTER — Ambulatory Visit (HOSPITAL_BASED_OUTPATIENT_CLINIC_OR_DEPARTMENT_OTHER): Payer: Medicare PPO | Attending: Internal Medicine | Admitting: Cardiology

## 2017-10-20 VITALS — Ht 64.0 in | Wt 223.0 lb

## 2017-10-20 DIAGNOSIS — G4733 Obstructive sleep apnea (adult) (pediatric): Secondary | ICD-10-CM | POA: Diagnosis not present

## 2017-10-20 DIAGNOSIS — R5383 Other fatigue: Secondary | ICD-10-CM | POA: Diagnosis not present

## 2017-10-20 DIAGNOSIS — R0683 Snoring: Secondary | ICD-10-CM | POA: Diagnosis not present

## 2017-10-21 ENCOUNTER — Other Ambulatory Visit: Payer: Self-pay | Admitting: Family

## 2017-10-23 DIAGNOSIS — R110 Nausea: Secondary | ICD-10-CM | POA: Diagnosis not present

## 2017-10-23 DIAGNOSIS — I10 Essential (primary) hypertension: Secondary | ICD-10-CM | POA: Diagnosis not present

## 2017-10-23 DIAGNOSIS — Z6838 Body mass index (BMI) 38.0-38.9, adult: Secondary | ICD-10-CM | POA: Diagnosis not present

## 2017-10-23 DIAGNOSIS — M1990 Unspecified osteoarthritis, unspecified site: Secondary | ICD-10-CM | POA: Diagnosis not present

## 2017-10-23 DIAGNOSIS — E876 Hypokalemia: Secondary | ICD-10-CM | POA: Diagnosis not present

## 2017-10-23 DIAGNOSIS — E785 Hyperlipidemia, unspecified: Secondary | ICD-10-CM | POA: Diagnosis not present

## 2017-10-23 DIAGNOSIS — E669 Obesity, unspecified: Secondary | ICD-10-CM | POA: Diagnosis not present

## 2017-11-03 NOTE — Procedures (Signed)
   Patient Name: [name], [name] Study Date:06/11/2017 10/20/2017 Gender: Female D.O.B: Mar 03, 1961 Age (years): 57 Referring Provider: [name] Height (inches): 45 Interpreting Physician: Fransico Him MD, ABSM Weight (lbs): 223 RPSGT: Jorge Ny BMI: 38 MRN: [id number] Neck Size: 16.50  CLINICAL INFORMATION Sleep Study Type: NPSG  Indication for sleep study: Fatigue, Hypertension, Insomnia, Morbid Obesity, Snoring  Epworth Sleepiness Score: 0  SLEEP STUDY TECHNIQUE As per the AASM Manual for the Scoring of Sleep and Associated Events v2.3 (April 2016) with a hypopnea requiring 4% desaturations.  The channels recorded and monitored were frontal, central and occipital EEG, electrooculogram (EOG), submentalis EMG (chin), nasal and oral airflow, thoracic and abdominal wall motion, anterior tibialis EMG, snore microphone, electrocardiogram, and pulse oximetry.  MEDICATIONS Medications self-administered by patient taken the night of the study : ADVIL PM  SLEEP ARCHITECTURE The study was initiated at 10:49:25 PM and ended at 5:13:27 AM.  Sleep onset time was 17.5 minutes and the sleep efficiency was 88.7%%. The total sleep time was 340.5 minutes.  Stage REM latency was 122.0 minutes.  The patient spent 4.7%% of the night in stage N1 sleep, 70.3%% in stage N2 sleep, 0.0%% in stage N3 and 24.96% in REM.  Alpha intrusion was absent.  Supine sleep was 4.70%.  RESPIRATORY PARAMETERS The overall apnea/hypopnea index (AHI) was 6.2 per hour. There were 13 total apneas, including 10 obstructive, 0 central and 3 mixed apneas. There were 22 hypopneas and 29 RERAs.  The AHI during Stage REM sleep was 19.1 per hour.  AHI while supine was 26.3 per hour.  The mean oxygen saturation was 94.0%. The minimum SpO2 during sleep was 89.0%.  moderate snoring was noted during this study.  CARDIAC DATA The 2 lead EKG demonstrated sinus rhythm. The mean heart rate was 61.4 beats per  minute. Other EKG findings include: PACs.  LEG MOVEMENT DATA The total PLMS were 0 with a resulting PLMS index of 0.0. Associated arousal with leg movement index was 1.8 .  IMPRESSIONS - Very mild obstructive sleep apnea occurred during this study (AHI = 6.2/h). - No significant central sleep apnea occurred during this study (CAI = 0.0/h). - The patient had minimal or no oxygen desaturation during the study (Min O2 = 89.0%) - The patient snored with moderate snoring volume. - No cardiac abnormalities were noted during this study. - Clinically significant periodic limb movements did not occur during sleep. No significant associated arousals.  DIAGNOSIS - Obstructive Sleep Apnea (327.23 [G47.33 ICD-10])  RECOMMENDATIONS - Positional therapy avoiding supine position during sleep since most events occurred in the supine position. - Avoid alcohol, sedatives and other CNS depressants that may worsen sleep apnea and disrupt normal sleep architecture. - Sleep hygiene should be reviewed to assess factors that may improve sleep quality. - Weight management and regular exercise should be initiated or continued if appropriate.  [Electronically signed] 11/03/2017 01:38 PM  Fransico Him MD, ABSM Diplomate, American Board of Sleep Medicine

## 2017-11-04 NOTE — Telephone Encounter (Signed)
Called results LMTCB 

## 2017-11-07 ENCOUNTER — Telehealth: Payer: Self-pay | Admitting: *Deleted

## 2017-11-07 NOTE — Telephone Encounter (Signed)
Pt is aware and agreeable to results and recommendations 

## 2017-11-07 NOTE — Telephone Encounter (Signed)
-----   Message from Sueanne Margarita, MD sent at 11/03/2017  1:41 PM EDT ----- Please let patient know that she has minimal OSA which mainly occurs in the supine position.  recommmend avoiding supine sleep

## 2017-11-22 ENCOUNTER — Ambulatory Visit: Payer: Medicare Other | Admitting: Family

## 2017-12-16 ENCOUNTER — Other Ambulatory Visit: Payer: Self-pay | Admitting: Endocrinology

## 2017-12-17 ENCOUNTER — Encounter: Payer: Self-pay | Admitting: Family

## 2017-12-17 ENCOUNTER — Ambulatory Visit (HOSPITAL_BASED_OUTPATIENT_CLINIC_OR_DEPARTMENT_OTHER)
Admission: RE | Admit: 2017-12-17 | Discharge: 2017-12-17 | Disposition: A | Discharge status: Home / Self Care | Payer: Medicare PPO | Source: Ambulatory Visit | Attending: Family | Admitting: Family

## 2017-12-17 ENCOUNTER — Ambulatory Visit (INDEPENDENT_AMBULATORY_CARE_PROVIDER_SITE_OTHER): Payer: Medicare PPO | Admitting: Family

## 2017-12-17 VITALS — BP 138/73 | HR 55 | Temp 98.1°F | Resp 16 | Ht 64.5 in | Wt 226.6 lb

## 2017-12-17 DIAGNOSIS — M542 Cervicalgia: Secondary | ICD-10-CM

## 2017-12-17 DIAGNOSIS — R202 Paresthesia of skin: Secondary | ICD-10-CM | POA: Diagnosis not present

## 2017-12-17 LAB — B12 AND FOLATE PANEL
Folate: 10.2 ng/mL (ref >5.9)
Vitamin B-12: 268 pg/mL (ref 211–911)

## 2017-12-17 LAB — BASIC METABOLIC PANEL
BUN: 15 mg/dL (ref 6–23)
CO2: 29 mEq/L (ref 19–32)
Calcium: 9.2 mg/dL (ref 8.4–10.5)
Chloride: 100 mEq/L (ref 96–112)
Creatinine, Ser: 0.76 mg/dL (ref 0.40–1.20)
GFR: 100.09 mL/min (ref >60.00)
Glucose, Bld: 107 mg/dL — ABNORMAL HIGH (ref 70–99)
Potassium: 3.8 mEq/L (ref 3.5–5.1)
Sodium: 138 mEq/L (ref 135–145)

## 2017-12-17 LAB — TSH: TSH: 0.75 u[IU]/mL (ref 0.35–4.50)

## 2017-12-17 LAB — MAGNESIUM: Magnesium: 1.9 mg/dL (ref 1.5–2.5)

## 2017-12-17 MED ORDER — METHYLPREDNISOLONE 4 MG PO TBPK: ORAL_TABLET | ORAL | 0 refills | Status: DC

## 2017-12-17 MED ORDER — NYSTATIN 100000 UNIT/GM EX POWD: Freq: Two times a day (BID) | CUTANEOUS | 0 refills | Status: DC

## 2017-12-17 NOTE — Progress Notes (Signed)
Subjective:    Patient ID: [name], female    DOB: 12-05-1960, 57 y.o.   MRN: [id number]  HPI  [name] is a 57 yr old female who presents today with chief complaint of "tingling" sensation in both hands. This has been present for 5 days. Reports some mild neck pain.  Reports that she has tingling in her feet as well.  Reports that her grandson died last week.  She wonders if stress could be contributing to her symptoms.  She denies upper or lower extremity weakness.  She does report that she continues to have some neck pain with some radiculopathy on the left side.  This is slightly worse than her usual neck pain.  She denies bowel or bladder incontinence. Lab Results  Component Value Date   TSH 0.56 07/08/2017      Review of Systems See HPI  Past Medical History:  Diagnosis Date  . Allergy    allergic rhinitis  . Arthritis   . Atypical chest pain   . Constipation   . Cyst, ovarian   . Diabetes mellitus, type II ([place])   . Endometriosis   . Esophageal stricture   . Gastroparesis   . GERD (gastroesophageal reflux disease)   . Heart murmur   . Hyperlipidemia   . Hypertension   . 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 Iron deficiency anemia   . Migraine headache   . Non-compliance   . Peptic ulcer disease   . Thyroid disease      Social History   Socioeconomic History  . Marital status: Widowed    Spouse name: Not on file  . Number of children: 2  . Years of education: Not on file  . Highest education level: Not on file  Occupational History  . Occupation: DISABILITY    Employer: UNEMPLOYED  Social Needs  . Financial resource strain: Not on file  . Food insecurity:    Worry: Not on file    Inability: Not on file  . Transportation needs:    Medical: Not on file    Non-medical: Not on file  Tobacco Use  . Smoking status: Former Smoker    Packs/day: 0.50    Years: 18.00    Pack years: 9.00      Types: Cigarettes    Start date: 07/29/1977    Last attempt to quit: 06/25/1994    Years since quitting: 23.4  . Smokeless tobacco: Never Used  . Tobacco comment: quit 19 years ago  Substance and Sexual Activity  . Alcohol use: Yes    Alcohol/week: 0.0 oz    Comment: social drinker  . Drug use: No  . Sexual activity: Not Currently  Lifestyle  . Physical activity:    Days per week: Not on file    Minutes per session: Not on file  . Stress: Not on file  Relationships  . Social connections:    Talks on phone: Not on file    Gets together: Not on file    Attends religious service: Not on file    Active member of club or organization: Not on file    Attends meetings of clubs or organizations: Not on file    Relationship status: Not on file  . Intimate partner violence:    Fear of current or ex partner: Not on file    Emotionally abused: Not on file    Physically abused: Not on file  Forced sexual activity: Not on file  Other Topics Concern  . Not on file  Social History Narrative   Widowed   Has 2 grown children.  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   Former Smoker Quit tobacco in 1996.  She was a pack a day smoker for approximately 10 years.   Alcohol use-yes: Social    Daily Caffeine Use:6 pack of pepsi daily     Illicit Drug Use - no    Patient does not get regular exercise.       Smoking Status:  quit    Past Surgical History:  Procedure Laterality Date  . ABDOMINAL EXPLORATION SURGERY     w/bso   . CARDIAC CATHETERIZATION  2009   mild non obstructive CAD  . CHOLECYSTECTOMY    . KNEE SURGERY  2005    left knee  . LEFT HEART CATH AND CORONARY ANGIOGRAPHY N/A 07/17/2017   Procedure: LEFT HEART CATH AND CORONARY ANGIOGRAPHY;  Surgeon: Martinique, Peter M, MD;  Location: [facility];  Service: Cardiovascular;  Laterality: N/A;  . TOTAL ABDOMINAL HYSTERECTOMY      Family History  Problem Relation Age of Onset  .  Hypertension Mother   . Rheum arthritis Mother   . Hypertension Father   . Diabetes Father   . Prostate cancer Father   . Kidney disease Father   . Diabetes Sister   . Fibromyalgia Sister   . Diabetes Maternal Grandmother   . Lung cancer Maternal Grandfather   . Arthritis Brother   . Migraines Brother   . CAD Brother   . Fibromyalgia Sister   . Migraines Sister   . Colon cancer Neg Hx   . Thyroid disease Neg Hx     Allergies  Allergen Reactions  . Ace Inhibitors     REACTION: chronic cough  . Celebrex [Celecoxib] Other (See Comments)    "makes me bleed"  . Diovan [Valsartan]     angioedema    Current Outpatient Medications on File Prior to Visit  Medication Sig Dispense Refill  . amLODipine (NORVASC) 5 MG tablet TAKE 1 TABLET BY MOUTH ONCE DAILY 90 tablet 1  . aspirin 81 MG chewable tablet Chew 1 tablet (81 mg total) by mouth daily.    Marland Kitchen ezetimibe (ZETIA) 10 MG tablet Take 1 tablet (10 mg total) by mouth daily. 90 tablet 3  . hydrochlorothiazide (HYDRODIURIL) 25 MG tablet TAKE 1 TABLET BY MOUTH ONCE DAILY 90 tablet 1  . methimazole (TAPAZOLE) 5 MG tablet TAKE 1 TABLET BY MOUTH THREE TIMES  A WEEK 12 tablet 5  . potassium chloride SA (K-DUR,KLOR-CON) 20 MEQ tablet Take 1 tablet (20 mEq total) by mouth daily. 30 tablet 5  . rosuvastatin (CRESTOR) 40 MG tablet Take 1 tablet (40 mg total) by mouth every other day. 45 tablet 1  . traZODone (DESYREL) 50 MG tablet Take 0.5-1 tablets (25-50 mg total) by mouth at bedtime as needed for sleep. 30 tablet 3  . [DISCONTINUED] atorvastatin (LIPITOR) 40 MG tablet Take 1 tablet (40 mg total) by mouth daily. 30 tablet 3  . [DISCONTINUED] famotidine (PEPCID) 20 MG tablet Take 1 tablet (20 mg total) by mouth 2 (two) times daily. 30 tablet 0   No current facility-administered medications on file prior to visit.     BP 138/73 (BP Location: Right Arm, Patient Position: Sitting, Cuff Size: Large)   Pulse (!) 55   Temp 98.1 F (36.7 C) (  Oral)    Resp 16   Ht 5' 4.5" (1.638 m)   Wt 226 lb 9.6 oz (102.8 kg)   SpO2 97%   BMI 38.30 kg/m       Objective:   Physical Exam  Constitutional: She is oriented to person, place, and time. She appears well-developed and well-nourished.  Cardiovascular: Normal rate, regular rhythm and normal heart sounds.  No murmur heard. Pulmonary/Chest: Effort normal and breath sounds normal. No respiratory distress. She has no wheezes.  Neurological: She is alert and oriented to person, place, and time.  Reflex Scores:      Patellar reflexes are 2+ on the right side and 2+ on the left side. Bilateral upper and lower extremity strength is 5 out of 5 Negative Tinel's and negative Phalen's bilaterally Sensation is intact to light touch bilateral  hands and feet  Psychiatric: She has a normal mood and affect. Her behavior is normal. Judgment and thought content normal.          Assessment & Plan:  Paresthesia- will obtain basic metabolic panel, magnesium, TSH, B12 and folate.  Neck pain with radiculopathy- trial of Medrol Dosepak.  Will obtain plain film of the cervical spine.  If symptoms worsen or fail to improve consider further imaging such as MRI.

## 2017-12-17 NOTE — Patient Instructions (Signed)
Please complete lab work prior to leaving. Complete neck x-ray on the first floor.

## 2017-12-25 ENCOUNTER — Telehealth: Payer: Self-pay | Admitting: *Deleted

## 2017-12-25 ENCOUNTER — Other Ambulatory Visit: Payer: Self-pay | Admitting: Family

## 2017-12-25 MED ORDER — TRAMADOL HCL 50 MG PO TABS: 50.0000 mg | ORAL_TABLET | Freq: Three times a day (TID) | ORAL | 0 refills | Status: DC | PRN

## 2017-12-25 MED ORDER — METHOCARBAMOL 500 MG PO TABS: 500.0000 mg | ORAL_TABLET | Freq: Three times a day (TID) | ORAL | 0 refills | Status: DC | PRN

## 2017-12-25 NOTE — Progress Notes (Unsigned)
Rx sent for tramadol for short term use as well as robaxin (short term). Please let me know if symptoms worsen or fail to improve. Also did she take the medrol pak? If not she should start.

## 2017-12-25 NOTE — Telephone Encounter (Signed)
Rx sent for tramadol for short term use as well as robaxin (short term). Please let me know if symptoms worsen or fail to improve. Also did she take the medrol pak? If not she should start.

## 2017-12-25 NOTE — Telephone Encounter (Signed)
Notified pt of below and she states that she did take the medrol dosepak. She will try below and let us know if no improvement or worsening of symptoms.

## 2017-12-25 NOTE — Telephone Encounter (Signed)
Pt called stating she was seen on 12/17/17 for back pain and the tylenol and ibuprofen are not helping. She is requesting a muscle relaxer and something for the pain.  Please advise?

## 2018-01-19 NOTE — Progress Notes (Signed)
Subjective:    Patient ID: [name], female    DOB: 07-31-1960, 57 y.o.   MRN: [id number]  HPI  Patient is a 57 year old female presents today for follow-up.  She was last seen on December 17, 2017.  At that time she complained of a tingling sensation in both of her hands.  She also reported some mild neck pain and tingling in her feet.  She had unfortunately just lost her grandson.  For her neck pain with radiculopathy we gave her a trial of Medrol Dosepak.  We also obtained a plain film of her cervical spine.  Plain film appeared normal.  TSH, B12, folate, and magnesium were all within normal limits.  She reports resolution of her paresthesias.  She reports ongoing low back pain.  "I think it is my bulging disc."  Uses tylenol most of the time but uses  tramadol when symptoms are more severe.  She is requesting a refill of her tramadol.  Migraines-patient reports that these are well controlled.   Review of Systems See HPI  Past Medical History:  Diagnosis Date  . Allergy    allergic rhinitis  . Arthritis   . Atypical chest pain   . Constipation   . Cyst, ovarian   . Diabetes mellitus, type II ([place])   . Endometriosis   . Esophageal stricture   . Gastroparesis   . GERD (gastroesophageal reflux disease)   . Heart murmur   . Hyperlipidemia   . Hypertension   . 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 Iron deficiency anemia   . Migraine headache   . Non-compliance   . Peptic ulcer disease   . Thyroid disease      Social History   Socioeconomic History  . Marital status: Widowed    Spouse name: Not on file  . Number of children: 2  . Years of education: Not on file  . Highest education level: Not on file  Occupational History  . Occupation: DISABILITY    Employer: UNEMPLOYED  Social Needs  . Financial resource strain: Not on file  . Food insecurity:    Worry: Not on file    Inability: Not on file  .  Transportation needs:    Medical: Not on file    Non-medical: Not on file  Tobacco Use  . Smoking status: Former Smoker    Packs/day: 0.50    Years: 18.00    Pack years: 9.00    Types: Cigarettes    Start date: 07/29/1977    Last attempt to quit: 06/25/1994    Years since quitting: 23.5  . Smokeless tobacco: Never Used  . Tobacco comment: quit 19 years ago  Substance and Sexual Activity  . Alcohol use: Yes    Alcohol/week: 0.0 oz    Comment: social drinker  . Drug use: No  . Sexual activity: Not Currently  Lifestyle  . Physical activity:    Days per week: Not on file    Minutes per session: Not on file  . Stress: Not on file  Relationships  . Social connections:    Talks on phone: Not on file    Gets together: Not on file    Attends religious service: Not on file    Active member of club or organization: Not on file    Attends meetings of clubs or organizations: Not on file    Relationship status: Not on file  . Intimate partner  violence:    Fear of current or ex partner: Not on file    Emotionally abused: Not on file    Physically abused: Not on file    Forced sexual activity: Not on file  Other Topics Concern  . Not on file  Social History Narrative   Widowed   Has 2 grown children.  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   Former Smoker Quit tobacco in 1996.  She was a pack a day smoker for approximately 10 years.   Alcohol use-yes: Social    Daily Caffeine Use:6 pack of pepsi daily     Illicit Drug Use - no    Patient does not get regular exercise.       Smoking Status:  quit    Past Surgical History:  Procedure Laterality Date  . ABDOMINAL EXPLORATION SURGERY     w/bso   . CARDIAC CATHETERIZATION  2009   mild non obstructive CAD  . CHOLECYSTECTOMY    . KNEE SURGERY  2005    left knee  . LEFT HEART CATH AND CORONARY ANGIOGRAPHY N/A 07/17/2017   Procedure: LEFT HEART CATH AND CORONARY ANGIOGRAPHY;  Surgeon:  Martinique, Peter M, MD;  Location: [facility];  Service: Cardiovascular;  Laterality: N/A;  . TOTAL ABDOMINAL HYSTERECTOMY      Family History  Problem Relation Age of Onset  . Hypertension Mother   . Rheum arthritis Mother   . Hypertension Father   . Diabetes Father   . Prostate cancer Father   . Kidney disease Father   . Diabetes Sister   . Fibromyalgia Sister   . Diabetes Maternal Grandmother   . Lung cancer Maternal Grandfather   . Arthritis Brother   . Migraines Brother   . CAD Brother   . Fibromyalgia Sister   . Migraines Sister   . Colon cancer Neg Hx   . Thyroid disease Neg Hx     Allergies  Allergen Reactions  . Ace Inhibitors     REACTION: chronic cough  . Celebrex [Celecoxib] Other (See Comments)    "makes me bleed"  . Diovan [Valsartan]     angioedema    Current Outpatient Medications on File Prior to Visit  Medication Sig Dispense Refill  . amLODipine (NORVASC) 5 MG tablet TAKE 1 TABLET BY MOUTH ONCE DAILY 90 tablet 1  . aspirin 81 MG chewable tablet Chew 1 tablet (81 mg total) by mouth daily.    Marland Kitchen ezetimibe (ZETIA) 10 MG tablet Take 1 tablet (10 mg total) by mouth daily. 90 tablet 3  . hydrochlorothiazide (HYDRODIURIL) 25 MG tablet TAKE 1 TABLET BY MOUTH ONCE DAILY 90 tablet 1  . methimazole (TAPAZOLE) 5 MG tablet TAKE 1 TABLET BY MOUTH THREE TIMES  A WEEK 12 tablet 5  . methocarbamol (ROBAXIN) 500 MG tablet Take 1 tablet (500 mg total) by mouth every 8 (eight) hours as needed for muscle spasms. 20 tablet 0  . nystatin (MYCOSTATIN/NYSTOP) powder Apply topically 2 (two) times daily. 60 g 0  . potassium chloride SA (K-DUR,KLOR-CON) 20 MEQ tablet Take 1 tablet (20 mEq total) by mouth daily. 30 tablet 5  . rosuvastatin (CRESTOR) 40 MG tablet Take 1 tablet (40 mg total) by mouth every other day. 45 tablet 1  . SUMAtriptan (IMITREX) 50 MG tablet Take 50 mg by mouth every 2 (two) hours as needed for migraine. May repeat in 2 hours if headache persists or  recurs.    Marland Kitchen  traMADol (ULTRAM) 50 MG tablet Take 1 tablet (50 mg total) by mouth every 8 (eight) hours as needed. 15 tablet 0  . traZODone (DESYREL) 50 MG tablet Take 0.5-1 tablets (25-50 mg total) by mouth at bedtime as needed for sleep. 30 tablet 3  . [DISCONTINUED] atorvastatin (LIPITOR) 40 MG tablet Take 1 tablet (40 mg total) by mouth daily. 30 tablet 3  . [DISCONTINUED] famotidine (PEPCID) 20 MG tablet Take 1 tablet (20 mg total) by mouth 2 (two) times daily. 30 tablet 0   No current facility-administered medications on file prior to visit.     BP 136/75 (BP Location: Right Arm, Cuff Size: Normal)   Pulse (!) 55   Temp 98.5 F (36.9 C) (Oral)   Resp 16   Ht 5' 4.5" (1.638 m)   Wt 224 lb 12.8 oz (102 kg)   SpO2 100%   BMI 37.99 kg/m       Objective:   Physical Exam  Constitutional: She is oriented to person, place, and time. She appears well-developed and well-nourished.  Cardiovascular: Normal rate, regular rhythm and normal heart sounds.  No murmur heard. Pulmonary/Chest: Effort normal and breath sounds normal. No respiratory distress. She has no wheezes.  Neurological: She is alert and oriented to person, place, and time.  Skin: Skin is warm and dry.  Psychiatric: She has a normal mood and affect. Her behavior is normal. Judgment and thought content normal.          Assessment & Plan:  Chronic low back pain-stable.  Discussed sparing use of tramadol.  Continue Tyle for less severe pain.  Controlled substance contract will be updated today we will obtain a follow-up urine drug screen.  [place] controlled substance registry was reviewed and refills are appropriate.  Paresthesias-improved.  Monitor  Migraines-reports that these are stable.  Continues intermittent use of as needed Imitrex.

## 2018-01-20 ENCOUNTER — Encounter: Payer: Self-pay | Admitting: Family

## 2018-01-20 ENCOUNTER — Ambulatory Visit (INDEPENDENT_AMBULATORY_CARE_PROVIDER_SITE_OTHER): Payer: Medicare PPO | Admitting: Family

## 2018-01-20 VITALS — BP 136/75 | HR 55 | Temp 98.5°F | Resp 16 | Ht 64.5 in | Wt 224.8 lb

## 2018-01-20 DIAGNOSIS — Z79899 Other long term (current) drug therapy: Secondary | ICD-10-CM

## 2018-01-20 DIAGNOSIS — M545 Low back pain, unspecified: Secondary | ICD-10-CM

## 2018-01-20 DIAGNOSIS — G8929 Other chronic pain: Secondary | ICD-10-CM | POA: Diagnosis not present

## 2018-01-20 DIAGNOSIS — Z8669 Personal history of other diseases of the nervous system and sense organs: Secondary | ICD-10-CM

## 2018-01-20 DIAGNOSIS — R202 Paresthesia of skin: Secondary | ICD-10-CM

## 2018-01-20 MED ORDER — TRAMADOL HCL 50 MG PO TABS: 50.0000 mg | ORAL_TABLET | Freq: Three times a day (TID) | ORAL | 0 refills | Status: DC | PRN

## 2018-01-20 NOTE — Patient Instructions (Addendum)
Please complete lab work prior to leaving.   

## 2018-01-21 LAB — PAIN MGMT, PROFILE 8 W/CONF, U
6 Acetylmorphine: NEGATIVE ng/mL (ref <10)
Alcohol Metabolites: NEGATIVE ng/mL (ref <500)
Amphetamines: NEGATIVE ng/mL (ref <500)
Benzodiazepines: NEGATIVE ng/mL (ref <100)
Buprenorphine, Urine: NEGATIVE ng/mL (ref <5)
Cocaine Metabolite: NEGATIVE ng/mL (ref <150)
Creatinine: 129 mg/dL
MDMA: NEGATIVE ng/mL (ref <500)
Marijuana Metabolite: NEGATIVE ng/mL (ref <20)
Opiates: NEGATIVE ng/mL (ref <100)
Oxidant: NEGATIVE ug/mL (ref <200)
Oxycodone: NEGATIVE ng/mL (ref <100)
pH: 5.23 (ref 4.5–9.0)

## 2018-01-29 ENCOUNTER — Other Ambulatory Visit: Payer: Self-pay | Admitting: Family

## 2018-01-29 DIAGNOSIS — D500 Iron deficiency anemia secondary to blood loss (chronic): Secondary | ICD-10-CM

## 2018-01-30 ENCOUNTER — Inpatient Hospital Stay: Payer: Medicare PPO | Attending: Family

## 2018-01-30 ENCOUNTER — Inpatient Hospital Stay: Payer: Medicare PPO | Admitting: Family

## 2018-01-30 ENCOUNTER — Encounter: Payer: Self-pay | Admitting: Family

## 2018-01-30 ENCOUNTER — Other Ambulatory Visit: Payer: Self-pay

## 2018-01-30 ENCOUNTER — Inpatient Hospital Stay (HOSPITAL_BASED_OUTPATIENT_CLINIC_OR_DEPARTMENT_OTHER): Payer: Medicare PPO | Admitting: Family

## 2018-01-30 VITALS — BP 144/73 | HR 60 | Temp 98.7°F | Resp 16 | Wt 223.0 lb

## 2018-01-30 DIAGNOSIS — Z79899 Other long term (current) drug therapy: Secondary | ICD-10-CM | POA: Diagnosis not present

## 2018-01-30 DIAGNOSIS — Z7982 Long term (current) use of aspirin: Secondary | ICD-10-CM

## 2018-01-30 DIAGNOSIS — D509 Iron deficiency anemia, unspecified: Secondary | ICD-10-CM | POA: Diagnosis not present

## 2018-01-30 DIAGNOSIS — D500 Iron deficiency anemia secondary to blood loss (chronic): Secondary | ICD-10-CM

## 2018-01-30 LAB — CBC WITH DIFFERENTIAL (CANCER CENTER ONLY)
Basophils Absolute: 0 10*3/uL (ref 0.0–0.1)
Basophils Relative: 0 %
Eosinophils Absolute: 0.1 10*3/uL (ref 0.0–0.5)
Eosinophils Relative: 1 %
HCT: 37.7 % (ref 34.8–46.6)
Hemoglobin: 11.5 g/dL — ABNORMAL LOW (ref 11.6–15.9)
Lymphocytes Relative: 40 %
Lymphs Abs: 2.7 10*3/uL (ref 0.9–3.3)
MCH: 22.7 pg — ABNORMAL LOW (ref 26.0–34.0)
MCHC: 30.5 g/dL — ABNORMAL LOW (ref 32.0–36.0)
MCV: 74.4 fL — ABNORMAL LOW (ref 81.0–101.0)
Monocytes Absolute: 0.5 10*3/uL (ref 0.1–0.9)
Monocytes Relative: 7 %
Neutro Abs: 3.4 10*3/uL (ref 1.5–6.5)
Neutrophils Relative %: 52 %
Platelet Count: 199 10*3/uL (ref 145–400)
RBC: 5.07 MIL/uL (ref 3.70–5.32)
RDW: 14 % (ref 11.1–15.7)
WBC Count: 6.6 10*3/uL (ref 3.9–10.0)

## 2018-01-30 LAB — CMP (CANCER CENTER ONLY)
ALT: 30 U/L (ref 0–44)
AST: 24 U/L (ref 15–41)
Albumin: 3.8 g/dL (ref 3.5–5.0)
Alkaline Phosphatase: 134 U/L — ABNORMAL HIGH (ref 38–126)
Anion gap: 12 (ref 5–15)
BUN: 11 mg/dL (ref 6–20)
CO2: 27 mmol/L (ref 22–32)
Calcium: 9.4 mg/dL (ref 8.9–10.3)
Chloride: 102 mmol/L (ref 98–111)
Creatinine: 0.85 mg/dL (ref 0.44–1.00)
GFR, Est AFR Am: >60 mL/min (ref >60)
GFR, Estimated: >60 mL/min (ref >60)
Glucose, Bld: 105 mg/dL — ABNORMAL HIGH (ref 70–99)
Potassium: 4.1 mmol/L (ref 3.5–5.1)
Sodium: 141 mmol/L (ref 135–145)
Total Bilirubin: 0.4 mg/dL (ref 0.3–1.2)
Total Protein: 7.7 g/dL (ref 6.5–8.1)

## 2018-01-30 LAB — RETICULOCYTES
RBC.: 5.08 MIL/uL (ref 3.87–5.11)
Retic Count, Absolute: 50.8 10*3/uL (ref 19.0–186.0)
Retic Ct Pct: 1 % (ref 0.4–3.1)

## 2018-01-30 NOTE — Progress Notes (Signed)
Hematology and Oncology Follow Up Visit  [name] [id number] 29-Jun-1960 57 y.o. 01/30/2018   Principle Diagnosis:  Recurrent iron deficiency anemia  Current Therapy:   IV iron as indicated - last dose in May 2017   Interim History: [name] is here today for follow-up. Unfortunately, her sweet grandson passed away in 2022/12/29 and this has been hard for her.  She is symptomatic with fatigue, not sleeping well and dizziness.  She has had no episodes of bleeding, no bruising or petechiae. No cycle, history of hysterectomy.  No fever, chills, n/v, cough, rash, SOB, chest pain, palpitations, abdominal pain or changes in bowel or bladder habits.  No swelling, tenderness, numbness or tingling in her extremities. She has occasional pain in her hips. This comes and goes.  No lymphadenopathy noted on exam.  Her appetite is down and she admits that she needs to hydrate better. Her weight is stable.   ECOG Performance Status: 1 - Symptomatic but completely ambulatory  Medications:  Allergies as of 01/30/2018      Reactions   Ace Inhibitors    REACTION: chronic cough   Celebrex [celecoxib] Other (See Comments)   "makes me bleed"   Diovan [valsartan]    angioedema      Medication List        Accurate as of 01/30/18  1:20 PM. Always use your most recent med list.          amLODipine 5 MG tablet Commonly known as:  NORVASC TAKE 1 TABLET BY MOUTH ONCE DAILY   aspirin 81 MG chewable tablet Chew 1 tablet (81 mg total) by mouth daily.   ezetimibe 10 MG tablet Commonly known as:  ZETIA Take 1 tablet (10 mg total) by mouth daily.   hydrochlorothiazide 25 MG tablet Commonly known as:  HYDRODIURIL TAKE 1 TABLET BY MOUTH ONCE DAILY   methimazole 5 MG tablet Commonly known as:  TAPAZOLE TAKE 1 TABLET BY MOUTH THREE TIMES  A WEEK   methocarbamol 500 MG tablet Commonly known as:  ROBAXIN Take 1 tablet (500 mg total) by mouth every 8 (eight) hours as needed for muscle spasms.     nystatin powder Commonly known as:  MYCOSTATIN/NYSTOP Apply topically 2 (two) times daily.   potassium chloride SA 20 MEQ tablet Commonly known as:  K-DUR,KLOR-CON Take 1 tablet (20 mEq total) by mouth daily.   rosuvastatin 40 MG tablet Commonly known as:  CRESTOR Take 1 tablet (40 mg total) by mouth every other day.   SUMAtriptan 50 MG tablet Commonly known as:  IMITREX Take 50 mg by mouth every 2 (two) hours as needed for migraine. May repeat in 2 hours if headache persists or recurs.   traMADol 50 MG tablet Commonly known as:  ULTRAM Take 1 tablet (50 mg total) by mouth every 8 (eight) hours as needed.   traZODone 50 MG tablet Commonly known as:  DESYREL Take 0.5-1 tablets (25-50 mg total) by mouth at bedtime as needed for sleep.       Allergies:  Allergies  Allergen Reactions  . Ace Inhibitors     REACTION: chronic cough  . Celebrex [Celecoxib] Other (See Comments)    "makes me bleed"  . Diovan [Valsartan]     angioedema    Past Medical History, Surgical history, Social history, and Family History were reviewed and updated.  Review of Systems: All other 10 point review of systems is negative.   Physical Exam:  vitals were not taken for this visit.  Wt Readings from Last 3 Encounters:  01/20/18 224 lb 12.8 oz (102 kg)  12/17/17 226 lb 9.6 oz (102.8 kg)  10/20/17 223 lb (101.2 kg)    Ocular: Sclerae unicteric, pupils equal, round and reactive to light Ear-nose-throat: Oropharynx clear, dentition fair Lymphatic: No cervical, supraclavicular or axillary adenopathy Lungs no rales or rhonchi, good excursion bilaterally Heart regular rate and rhythm, no murmur appreciated Abd soft, nontender, positive bowel sounds, no liver or spleen tip palpated on exam, no fluid wave  MSK no focal spinal tenderness, no joint edema Neuro: non-focal, well-oriented, appropriate affect Breasts: Deferred   Lab Results  Component Value Date   WBC 7.8 09/24/2017   HGB 11.4  (L) 09/24/2017   HCT 36.1 09/24/2017   MCV 72.7 (L) 09/24/2017   PLT 242.0 09/24/2017   Lab Results  Component Value Date   FERRITIN 1,261 (H) 08/26/2017   IRON 86 08/26/2017   TIBC 285 08/26/2017   UIBC 199 08/26/2017   IRONPCTSAT 30 08/26/2017   Lab Results  Component Value Date   RETICCTPCT 1.0 04/08/2015   RBC 4.97 09/24/2017   RETICCTABS 48.5 04/08/2015   No results found for: Nils P[facility] Lab Results  Component Value Date   IGA 305 05/04/2009   Lab Results  Component Value Date   TOTALPROTELP 7.2 05/04/2009   ALBUMINELP 49.1 (L) 05/04/2009   A1GS 8.3 (H) 05/04/2009   A2GS 14.1 (H) 05/04/2009   BETS 6.6 05/04/2009   GAMS 16.4 05/04/2009   MSPIKE NOT DET 05/04/2009     Chemistry      Component Value Date/Time   NA 138 12/17/2017 1134   NA 140 08/26/2017 1112   NA 139 12/14/2016 0849   K 3.8 12/17/2017 1134   K 3.7 12/14/2016 0849   CL 100 12/17/2017 1134   CL 99 08/16/2015 1055   CO2 29 12/17/2017 1134   CO2 25 12/14/2016 0849   BUN 15 12/17/2017 1134   BUN 15 08/26/2017 1112   BUN 11.4 12/14/2016 0849   CREATININE 0.76 12/17/2017 1134   CREATININE 0.9 12/14/2016 0849      Component Value Date/Time   CALCIUM 9.2 12/17/2017 1134   CALCIUM 9.6 12/14/2016 0849   ALKPHOS 113 09/24/2017 0748   ALKPHOS 135 12/14/2016 0849   AST 16 09/24/2017 0748   AST 22 12/14/2016 0849   ALT 18 09/24/2017 0748   ALT 32 12/14/2016 0849   BILITOT 0.3 09/24/2017 0748   BILITOT 0.29 12/14/2016 0849      Impression and Plan: [name] is a very pleasant 57 yo African American female with history of iron deficiency anemia. She is symptomatic as mentioned above.  We will see what her iron studies show and bring her back in for infusion if needed.  We will plan to see her back in another 3 months for follow-up.  She will contact our office with any questions or concerns. We can certainly see her sooner if need be.   Laverna Peace,  NP 8/8/20191:20 PM

## 2018-01-31 LAB — FERRITIN: Ferritin: 1008 ng/mL — ABNORMAL HIGH (ref 11–307)

## 2018-01-31 LAB — IRON AND TIBC
Iron: 61 ug/dL (ref 41–142)
Saturation Ratios: 22 % (ref 21–57)
TIBC: 274 ug/dL (ref 236–444)
UIBC: 213 ug/dL

## 2018-02-04 ENCOUNTER — Other Ambulatory Visit: Payer: Self-pay

## 2018-02-04 ENCOUNTER — Encounter (HOSPITAL_COMMUNITY): Payer: Self-pay

## 2018-02-04 ENCOUNTER — Emergency Department (HOSPITAL_COMMUNITY): Payer: Medicare PPO

## 2018-02-04 ENCOUNTER — Inpatient Hospital Stay (HOSPITAL_COMMUNITY)
Admission: EM | Admit: 2018-02-04 | Discharge: 2018-02-08 | DRG: 074 | Disposition: A | Discharge status: Home / Self Care | Payer: Medicare PPO | Attending: Internal Medicine | Admitting: Internal Medicine

## 2018-02-04 DIAGNOSIS — K529 Noninfective gastroenteritis and colitis, unspecified: Secondary | ICD-10-CM | POA: Diagnosis present

## 2018-02-04 DIAGNOSIS — Z7982 Long term (current) use of aspirin: Secondary | ICD-10-CM

## 2018-02-04 DIAGNOSIS — R52 Pain, unspecified: Secondary | ICD-10-CM | POA: Diagnosis not present

## 2018-02-04 DIAGNOSIS — E0590 Thyrotoxicosis, unspecified without thyrotoxic crisis or storm: Secondary | ICD-10-CM | POA: Diagnosis present

## 2018-02-04 DIAGNOSIS — Z8711 Personal history of peptic ulcer disease: Secondary | ICD-10-CM

## 2018-02-04 DIAGNOSIS — Z9049 Acquired absence of other specified parts of digestive tract: Secondary | ICD-10-CM | POA: Diagnosis not present

## 2018-02-04 DIAGNOSIS — D1809 Hemangioma of other sites: Secondary | ICD-10-CM | POA: Diagnosis not present

## 2018-02-04 DIAGNOSIS — R1012 Left upper quadrant pain: Secondary | ICD-10-CM | POA: Diagnosis not present

## 2018-02-04 DIAGNOSIS — M797 Fibromyalgia: Secondary | ICD-10-CM | POA: Diagnosis present

## 2018-02-04 DIAGNOSIS — R1110 Vomiting, unspecified: Secondary | ICD-10-CM | POA: Diagnosis not present

## 2018-02-04 DIAGNOSIS — K219 Gastro-esophageal reflux disease without esophagitis: Secondary | ICD-10-CM | POA: Diagnosis present

## 2018-02-04 DIAGNOSIS — R935 Abnormal findings on diagnostic imaging of other abdominal regions, including retroperitoneum: Secondary | ICD-10-CM | POA: Diagnosis not present

## 2018-02-04 DIAGNOSIS — Z87891 Personal history of nicotine dependence: Secondary | ICD-10-CM | POA: Diagnosis not present

## 2018-02-04 DIAGNOSIS — R112 Nausea with vomiting, unspecified: Secondary | ICD-10-CM

## 2018-02-04 DIAGNOSIS — K3184 Gastroparesis: Secondary | ICD-10-CM | POA: Diagnosis present

## 2018-02-04 DIAGNOSIS — Z833 Family history of diabetes mellitus: Secondary | ICD-10-CM

## 2018-02-04 DIAGNOSIS — D509 Iron deficiency anemia, unspecified: Secondary | ICD-10-CM | POA: Diagnosis present

## 2018-02-04 DIAGNOSIS — I2510 Atherosclerotic heart disease of native coronary artery without angina pectoris: Secondary | ICD-10-CM | POA: Diagnosis present

## 2018-02-04 DIAGNOSIS — K5900 Constipation, unspecified: Secondary | ICD-10-CM | POA: Diagnosis not present

## 2018-02-04 DIAGNOSIS — K222 Esophageal obstruction: Secondary | ICD-10-CM

## 2018-02-04 DIAGNOSIS — E876 Hypokalemia: Secondary | ICD-10-CM | POA: Diagnosis present

## 2018-02-04 DIAGNOSIS — I10 Essential (primary) hypertension: Secondary | ICD-10-CM | POA: Diagnosis present

## 2018-02-04 DIAGNOSIS — E1143 Type 2 diabetes mellitus with diabetic autonomic (poly)neuropathy: Secondary | ICD-10-CM | POA: Diagnosis present

## 2018-02-04 DIAGNOSIS — I4581 Long QT syndrome: Secondary | ICD-10-CM | POA: Diagnosis not present

## 2018-02-04 DIAGNOSIS — E1159 Type 2 diabetes mellitus with other circulatory complications: Secondary | ICD-10-CM | POA: Diagnosis present

## 2018-02-04 DIAGNOSIS — R7303 Prediabetes: Secondary | ICD-10-CM | POA: Diagnosis present

## 2018-02-04 DIAGNOSIS — Z90710 Acquired absence of both cervix and uterus: Secondary | ICD-10-CM | POA: Diagnosis not present

## 2018-02-04 DIAGNOSIS — R0902 Hypoxemia: Secondary | ICD-10-CM | POA: Diagnosis not present

## 2018-02-04 DIAGNOSIS — R1084 Generalized abdominal pain: Secondary | ICD-10-CM | POA: Diagnosis not present

## 2018-02-04 LAB — GLUCOSE, CAPILLARY
Glucose-Capillary: 89 mg/dL (ref 70–99)
Glucose-Capillary: 154 mg/dL — ABNORMAL HIGH (ref 70–99)

## 2018-02-04 LAB — CBC WITH DIFFERENTIAL/PLATELET
Basophils Absolute: 0 10*3/uL (ref 0.0–0.1)
Basophils Relative: 0 %
Eosinophils Absolute: 0 10*3/uL (ref 0.0–0.7)
Eosinophils Relative: 0 %
HCT: 37.6 % (ref 36.0–46.0)
Hemoglobin: 12.1 g/dL (ref 12.0–15.0)
Lymphocytes Relative: 15 %
Lymphs Abs: 1.8 10*3/uL (ref 0.7–4.0)
MCH: 23.3 pg — ABNORMAL LOW (ref 26.0–34.0)
MCHC: 32.2 g/dL (ref 30.0–36.0)
MCV: 72.4 fL — ABNORMAL LOW (ref 78.0–100.0)
Monocytes Absolute: 0.6 10*3/uL (ref 0.1–1.0)
Monocytes Relative: 5 %
Neutro Abs: 9.9 10*3/uL — ABNORMAL HIGH (ref 1.7–7.7)
Neutrophils Relative %: 80 %
Platelets: 243 10*3/uL (ref 150–400)
RBC: 5.19 MIL/uL — ABNORMAL HIGH (ref 3.87–5.11)
RDW: 13.8 % (ref 11.5–15.5)
WBC: 12.3 10*3/uL — ABNORMAL HIGH (ref 4.0–10.5)

## 2018-02-04 LAB — COMPREHENSIVE METABOLIC PANEL
ALT: 31 U/L (ref 0–44)
AST: 29 U/L (ref 15–41)
Albumin: 4.3 g/dL (ref 3.5–5.0)
Alkaline Phosphatase: 127 U/L — ABNORMAL HIGH (ref 38–126)
Anion gap: 14 (ref 5–15)
BUN: 18 mg/dL (ref 6–20)
CO2: 25 mmol/L (ref 22–32)
Calcium: 9.6 mg/dL (ref 8.9–10.3)
Chloride: 102 mmol/L (ref 98–111)
Creatinine, Ser: 1.01 mg/dL — ABNORMAL HIGH (ref 0.44–1.00)
GFR calc Af Amer: >60 mL/min (ref >60)
GFR calc non Af Amer: 60 mL/min — ABNORMAL LOW (ref >60)
Glucose, Bld: 160 mg/dL — ABNORMAL HIGH (ref 70–99)
Potassium: 3.1 mmol/L — ABNORMAL LOW (ref 3.5–5.1)
Sodium: 141 mmol/L (ref 135–145)
Total Bilirubin: 0.6 mg/dL (ref 0.3–1.2)
Total Protein: 8.5 g/dL — ABNORMAL HIGH (ref 6.5–8.1)

## 2018-02-04 LAB — I-STAT TROPONIN, ED: Troponin i, poc: 0 ng/mL (ref 0.00–0.08)

## 2018-02-04 LAB — URINALYSIS, ROUTINE W REFLEX MICROSCOPIC
Bilirubin Urine: NEGATIVE
Glucose, UA: NEGATIVE mg/dL
Hgb urine dipstick: NEGATIVE
Ketones, ur: 5 mg/dL — AB
Leukocytes, UA: NEGATIVE
Nitrite: NEGATIVE
Protein, ur: NEGATIVE mg/dL
Specific Gravity, Urine: >1.046 — ABNORMAL HIGH (ref 1.005–1.030)
pH: 8 (ref 5.0–8.0)

## 2018-02-04 LAB — TYPE AND SCREEN
ABO/RH(D): AB POS
Antibody Screen: NEGATIVE

## 2018-02-04 LAB — LIPASE, BLOOD: Lipase: 29 U/L (ref 11–51)

## 2018-02-04 LAB — MAGNESIUM: Magnesium: 1.9 mg/dL (ref 1.7–2.4)

## 2018-02-04 LAB — ABO/RH: ABO/RH(D): AB POS

## 2018-02-04 MED ORDER — METRONIDAZOLE IN NACL 5-0.79 MG/ML-% IV SOLN: 500.0000 mg | Freq: Three times a day (TID) | INTRAVENOUS | Status: DC

## 2018-02-04 MED ORDER — FAMOTIDINE IN NACL 20-0.9 MG/50ML-% IV SOLN
20.0000 mg | Freq: Once | INTRAVENOUS | Status: AC
  Administered 2018-02-04: 20 mg via INTRAVENOUS
  Filled 2018-02-04: qty 50

## 2018-02-04 MED ORDER — CIPROFLOXACIN IN D5W 400 MG/200ML IV SOLN: 400.0000 mg | Freq: Two times a day (BID) | INTRAVENOUS | Status: DC

## 2018-02-04 MED ORDER — ACETAMINOPHEN 325 MG PO TABS: 650.0000 mg | ORAL_TABLET | Freq: Four times a day (QID) | ORAL | Status: DC | PRN

## 2018-02-04 MED ORDER — IOPAMIDOL (ISOVUE-300) INJECTION 61%
INTRAVENOUS | Status: AC
  Filled 2018-02-04: qty 100

## 2018-02-04 MED ORDER — PIPERACILLIN-TAZOBACTAM 3.375 G IVPB
3.3750 g | Freq: Three times a day (TID) | INTRAVENOUS | Status: DC
  Administered 2018-02-04 – 2018-02-05 (×2): 3.375 g via INTRAVENOUS
  Filled 2018-02-04 (×3): qty 50

## 2018-02-04 MED ORDER — MORPHINE SULFATE (PF) 2 MG/ML IV SOLN
2.0000 mg | INTRAVENOUS | Status: DC | PRN
Start: 2018-02-04 — End: 2018-02-07
  Administered 2018-02-04 – 2018-02-07 (×10): 2 mg via INTRAVENOUS
  Filled 2018-02-04 (×10): qty 1

## 2018-02-04 MED ORDER — CIPROFLOXACIN IN D5W 400 MG/200ML IV SOLN
400.0000 mg | Freq: Once | INTRAVENOUS | Status: AC
  Administered 2018-02-04: 400 mg via INTRAVENOUS
  Filled 2018-02-04: qty 200

## 2018-02-04 MED ORDER — POTASSIUM CHLORIDE IN NACL 40-0.9 MEQ/L-% IV SOLN
INTRAVENOUS | Status: DC
  Administered 2018-02-04: 100 mL/h via INTRAVENOUS
  Filled 2018-02-04 (×2): qty 1000

## 2018-02-04 MED ORDER — INSULIN ASPART 100 UNIT/ML ~~LOC~~ SOLN: 0.0000 [IU] | Freq: Three times a day (TID) | SUBCUTANEOUS | Status: DC

## 2018-02-04 MED ORDER — POTASSIUM CHLORIDE CRYS ER 20 MEQ PO TBCR
40.0000 meq | EXTENDED_RELEASE_TABLET | Freq: Once | ORAL | Status: AC
  Administered 2018-02-04: 40 meq via ORAL
  Filled 2018-02-04: qty 2

## 2018-02-04 MED ORDER — MORPHINE SULFATE (PF) 4 MG/ML IV SOLN
4.0000 mg | Freq: Once | INTRAVENOUS | Status: AC
  Administered 2018-02-04: 4 mg via INTRAVENOUS
  Filled 2018-02-04: qty 1

## 2018-02-04 MED ORDER — SODIUM CHLORIDE 0.9 % IV BOLUS
1000.0000 mL | Freq: Once | INTRAVENOUS | Status: AC
  Administered 2018-02-04: 1000 mL via INTRAVENOUS

## 2018-02-04 MED ORDER — ENOXAPARIN SODIUM 60 MG/0.6ML ~~LOC~~ SOLN
50.0000 mg | SUBCUTANEOUS | Status: DC
  Administered 2018-02-04 – 2018-02-07 (×4): 50 mg via SUBCUTANEOUS
  Filled 2018-02-04 (×4): qty 0.6

## 2018-02-04 MED ORDER — HYDRALAZINE HCL 20 MG/ML IJ SOLN: 10.0000 mg | Freq: Four times a day (QID) | INTRAMUSCULAR | Status: DC | PRN

## 2018-02-04 MED ORDER — METOCLOPRAMIDE HCL 5 MG/ML IJ SOLN
10.0000 mg | Freq: Once | INTRAMUSCULAR | Status: AC
  Administered 2018-02-04: 10 mg via INTRAVENOUS
  Filled 2018-02-04: qty 2

## 2018-02-04 MED ORDER — ACETAMINOPHEN 650 MG RE SUPP: 650.0000 mg | Freq: Four times a day (QID) | RECTAL | Status: DC | PRN

## 2018-02-04 MED ORDER — METOCLOPRAMIDE HCL 5 MG/ML IJ SOLN
5.0000 mg | Freq: Four times a day (QID) | INTRAMUSCULAR | Status: DC | PRN
  Administered 2018-02-04: 5 mg via INTRAVENOUS
  Filled 2018-02-04: qty 2

## 2018-02-04 MED ORDER — DIPHENHYDRAMINE HCL 50 MG/ML IJ SOLN
25.0000 mg | Freq: Once | INTRAMUSCULAR | Status: AC
  Administered 2018-02-04: 25 mg via INTRAVENOUS
  Filled 2018-02-04: qty 1

## 2018-02-04 MED ORDER — PROMETHAZINE HCL 25 MG/ML IJ SOLN
12.5000 mg | INTRAMUSCULAR | Status: DC | PRN
  Administered 2018-02-04 – 2018-02-05 (×4): 12.5 mg via INTRAVENOUS
  Filled 2018-02-04 (×5): qty 1

## 2018-02-04 MED ORDER — PROMETHAZINE HCL 25 MG/ML IJ SOLN
25.0000 mg | Freq: Once | INTRAMUSCULAR | Status: AC
  Administered 2018-02-04: 25 mg via INTRAVENOUS
  Filled 2018-02-04: qty 1

## 2018-02-04 MED ORDER — IOPAMIDOL (ISOVUE-300) INJECTION 61%
100.0000 mL | Freq: Once | INTRAVENOUS | Status: AC | PRN
  Administered 2018-02-04: 100 mL via INTRAVENOUS

## 2018-02-04 MED ORDER — METRONIDAZOLE IN NACL 5-0.79 MG/ML-% IV SOLN
500.0000 mg | Freq: Once | INTRAVENOUS | Status: AC
  Administered 2018-02-04: 500 mg via INTRAVENOUS
  Filled 2018-02-04: qty 100

## 2018-02-04 NOTE — ED Notes (Signed)
   Report given to [place],RN/ [phone].

## 2018-02-04 NOTE — ED Notes (Signed)
ED TO INPATIENT HANDOFF REPORT  Name/Age/Gender [name] 57 y.o. female  Code Status Code Status History    Date Active Date Inactive Code Status Order ID Comments User Context   07/16/2017 2224 07/18/2017 1853 Full Code [id number]  Rise Patience, MD ED    Advance Directive Documentation     Most Recent Value  Type of Advance Directive  Living will, Healthcare Power of Attorney  Pre-existing out of facility DNR order (yellow form or pink MOST form)  -  "MOST" Form in Place?  -      Home/SNF/Other Home  Chief Complaint abd pain   Level of Care/Admitting Diagnosis ED Disposition    ED Disposition Condition [place]: [facility] [100102]  Level of Care: Med-Surg [16]  Diagnosis: Colitis [606301]  Admitting Physician: Bethena Roys [phone]  Attending Physician: Bethena Roys Nessa.Cuff  PT Class (Do Not Modify): Observation [104]  PT Acc Code (Do Not Modify): Observation [10022]       Medical History Past Medical History:  Diagnosis Date  . Allergy    allergic rhinitis  . Arthritis   . Atypical chest pain   . Constipation   . Cyst, ovarian   . Diabetes mellitus, type II ([place])   . Endometriosis   . Esophageal stricture   . Gastroparesis   . GERD (gastroesophageal reflux disease)   . Heart murmur   . Hyperlipidemia   . Hypertension   . 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 Iron deficiency anemia   . Migraine headache   . Non-compliance   . Peptic ulcer disease   . Thyroid disease     Allergies Allergies  Allergen Reactions  . Ace Inhibitors     REACTION: chronic cough  . Celebrex [Celecoxib] Other (See Comments)    "makes me bleed"  . Diovan [Valsartan]     angioedema    IV Location/Drains/Wounds Patient Lines/Drains/Airways Status   Active Line/Drains/Airways    Name:   Placement date:   Placement time:   Site:   Days:    Peripheral IV 02/04/18 Left Forearm   02/04/18    1047    Forearm   less than 1          Labs/Imaging Results for orders placed or performed during the hospital encounter of 02/04/18 (from the past 48 hour(s))  Type and screen     Status: None   Collection Time: 02/04/18 11:00 AM  Result Value Ref Range   ABO/RH(D) AB POS    Antibody Screen NEG    Sample Expiration      02/07/2018 Performed at [facility], [place] [address], [place], [place] [address]   ABO/Rh     Status: None   Collection Time: 02/04/18 11:05 AM  Result Value Ref Range   ABO/RH(D)      AB POS Performed at [facility], [place] [address] [place], [place] [address]   CBC with Differential/Platelet     Status: Abnormal   Collection Time: 02/04/18 11:10 AM  Result Value Ref Range   WBC 12.3 (H) 4.0 - 10.5 K/uL   RBC 5.19 (H) 3.87 - 5.11 MIL/uL   Hemoglobin 12.1 12.0 - 15.0 g/dL   HCT 37.6 36.0 - 46.0 %   MCV 72.4 (L) 78.0 - 100.0 fL   MCH 23.3 (L) 26.0 - 34.0 pg   MCHC 32.2 30.0 -  36.0 g/dL   RDW 13.8 11.5 - 15.5 %   Platelets 243 150 - 400 K/uL   Neutrophils Relative % 80 %   Neutro Abs 9.9 (H) 1.7 - 7.7 K/uL   Lymphocytes Relative 15 %   Lymphs Abs 1.8 0.7 - 4.0 K/uL   Monocytes Relative 5 %   Monocytes Absolute 0.6 0.1 - 1.0 K/uL   Eosinophils Relative 0 %   Eosinophils Absolute 0.0 0.0 - 0.7 K/uL   Basophils Relative 0 %   Basophils Absolute 0.0 0.0 - 0.1 K/uL   RBC Morphology POLYCHROMASIA PRESENT     Comment: Performed at [facility], [place] [address] [place], [place] [address]  Comprehensive metabolic panel     Status: Abnormal   Collection Time: 02/04/18 11:10 AM  Result Value Ref Range   Sodium 141 135 - 145 mmol/L   Potassium 3.1 (L) 3.5 - 5.1 mmol/L   Chloride 102 98 - 111 mmol/L   CO2 25 22 - 32 mmol/L   Glucose, Bld 160 (H) 70 - 99 mg/dL   BUN 18 6 - 20 mg/dL   Creatinine, Ser 1.01 (H) 0.44 - 1.00 mg/dL   Calcium 9.6 8.9 - 10.3  mg/dL   Total Protein 8.5 (H) 6.5 - 8.1 g/dL   Albumin 4.3 3.5 - 5.0 g/dL   AST 29 15 - 41 U/L   ALT 31 0 - 44 U/L   Alkaline Phosphatase 127 (H) 38 - 126 U/L   Total Bilirubin 0.6 0.3 - 1.2 mg/dL   GFR calc non Af Amer 60 (L) >60 mL/min   GFR calc Af Amer >60 >60 mL/min    Comment: (NOTE) The eGFR has been calculated using the CKD EPI equation. This calculation has not been validated in all clinical situations. eGFR's persistently <60 mL/min signify possible Chronic Kidney Disease.    Anion gap 14 5 - 15    Comment: Performed at [facility], [place] [address] [place], [place] [address]  Lipase, blood     Status: None   Collection Time: 02/04/18 11:10 AM  Result Value Ref Range   Lipase 29 11 - 51 U/L    Comment: Performed at [facility] [place] [address] [place], [place] [address]  Magnesium     Status: None   Collection Time: 02/04/18 11:10 AM  Result Value Ref Range   Magnesium 1.9 1.7 - 2.4 mg/dL    Comment: Performed at [facility], [place] [address], [place],  [address]  I-stat troponin, ED     Status: None   Collection Time: 02/04/18 11:16 AM  Result Value Ref Range   Troponin i, poc 0.00 0.00 - 0.08 ng/mL   Comment 3            Comment: Due to the release kinetics of cTnI, a negative result within the first hours of the onset of symptoms does not rule out myocardial infarction with certainty. If myocardial infarction is still suspected, repeat the test at appropriate intervals.   Urinalysis, Routine w reflex microscopic     Status: Abnormal   Collection Time: 02/04/18  1:41 PM  Result Value Ref Range   Color, Urine STRAW (A) YELLOW   APPearance CLEAR CLEAR   Specific Gravity, Urine >1.046 (H) 1.005 - 1.030   pH 8.0 5.0 - 8.0   Glucose, UA NEGATIVE NEGATIVE mg/dL   Hgb urine dipstick NEGATIVE NEGATIVE   Bilirubin Urine NEGATIVE NEGATIVE   Ketones,  ur 5 (A) NEGATIVE mg/dL   Protein, ur NEGATIVE  NEGATIVE mg/dL   Nitrite NEGATIVE NEGATIVE   Leukocytes, UA NEGATIVE NEGATIVE    Comment: Performed at [facility], [place] [address], [place], [place] [address]   Ct Abdomen Pelvis W Contrast  Result Date: 02/04/2018 CLINICAL DATA:  Diffuse abdominal pain for the past 2 days. EXAM: CT ABDOMEN AND PELVIS WITH CONTRAST TECHNIQUE: Multidetector CT imaging of the abdomen and pelvis was performed using the standard protocol following bolus administration of intravenous contrast. CONTRAST:  139m ISOVUE-300 IOPAMIDOL (ISOVUE-300) INJECTION 61% COMPARISON:  CT abdomen pelvis dated October 05, 2011. FINDINGS: Lower chest: No acute abnormality. Slightly patulous distal esophagus. Hepatobiliary: Interval enlargement of the right hepatic lobe hemangioma, now measuring up to 8.5 cm, previously 7.4 cm. No new focal liver abnormality. Status post cholecystectomy. No biliary dilatation. Pancreas: Unremarkable. No pancreatic ductal dilatation or surrounding inflammatory changes. Spleen: Normal in size without focal abnormality. Adrenals/Urinary Tract: Adrenal glands are unremarkable. Kidneys are normal, without renal calculi, focal lesion, or hydronephrosis. Bladder is unremarkable. Stomach/Bowel: Small hiatal hernia. The stomach is otherwise within normal limits. There is mild circumferential wall thickening of the descending colon. The remaining colon and small bowel are unremarkable. No obstruction. Normal appendix. Vascular/Lymphatic: Aortic atherosclerosis. No enlarged abdominal or pelvic lymph nodes. Reproductive: Status post hysterectomy. No adnexal masses. Other: No abdominal wall hernia or abnormality. No abdominopelvic ascites. No pneumoperitoneum. Musculoskeletal: 1.9 cm area of fat necrosis in the right breast. No acute or significant osseous finding. IMPRESSION: 1. Mild circumferential wall thickening of the descending colon, consistent with colitis. 2. Mild interval enlargement of the right  hepatic lobe hemangioma. 3.  Aortic atherosclerosis (ICD10-I70.0). Electronically Signed   By: WTitus DubinM.D.   On: 02/04/2018 12:48   Dg Chest Port 1 View  Result Date: 02/04/2018 CLINICAL DATA:  Vomiting. EXAM: PORTABLE CHEST 1 VIEW COMPARISON:  Chest x-ray dated 07/16/2017 FINDINGS: The heart size and mediastinal contours are within normal limits. Both lungs are clear. The visualized skeletal structures are unremarkable. No visible free air under the diaphragm. IMPRESSION: Normal exam. Electronically Signed   By: JLorriane ShireM.D.   On: 02/04/2018 11:53    Pending Labs [employer](From admission, onward)    Start     Ordered   Signed and HOccupational hygienistmorning,   R     Signed and Held   Signed and Held  CBC  Tomorrow morning,   R     Signed and Held          Vitals/Pain Today's Vitals   02/04/18 1152 02/04/18 1200 02/04/18 1341 02/04/18 1430  BP: (!) 162/76 (!) 156/73 (!) 155/69 (!) 147/66  Pulse: 67 69 72 74  Resp: _0 Temp:      TempSrc:      SpO2: 100% 100% 100% 100%  PainSc:        Isolation Precautions No active isolations  Medications Medications  iopamidol (ISOVUE-300) 61 % injection (has no administration in time range)  metroNIDAZOLE (FLAGYL) IVPB 500 mg (500 mg Intravenous New Bag/Given 02/04/18 1441)  promethazine (PHENERGAN) injection 12.5 mg (12.5 mg Intravenous Given 02/04/18 1506)  sodium chloride 0.9 % bolus 1,000 mL (0 mLs Intravenous Stopped 02/04/18 1153)  promethazine (PHENERGAN) injection 25 mg (25 mg Intravenous Given 02/04/18 1111)  morphine 4 MG/ML injection 4 mg (4 mg Intravenous Given 02/04/18 1111)  famotidine (PEPCID) IVPB 20 mg premix (0  mg Intravenous Stopped 02/04/18 1153)  iopamidol (ISOVUE-300) 61 % injection 100 mL (100 mLs Intravenous Contrast Given 02/04/18 1210)  metoCLOPramide (REGLAN) injection 10 mg (10 mg Intravenous Given 02/04/18 1326)  diphenhydrAMINE (BENADRYL) injection 25 mg (25 mg  Intravenous Given 02/04/18 1326)  sodium chloride 0.9 % bolus 1,000 mL (0 mLs Intravenous Stopped 02/04/18 1439)  ciprofloxacin (CIPRO) IVPB 400 mg ( Intravenous Stopped 02/04/18 1437)  potassium chloride SA (K-DUR,KLOR-CON) CR tablet 40 mEq (40 mEq Oral Given 02/04/18 1445)    Mobility walks

## 2018-02-04 NOTE — ED Notes (Signed)
Bed: PZ98 Expected date:  Expected time:  Means of arrival:  Comments: Vomiting x 2 days

## 2018-02-04 NOTE — ED Notes (Signed)
Patient aware we need urine sample. Patient will call out when ready to void.  

## 2018-02-04 NOTE — H&P (Addendum)
History and Physical    [name] TIR:[id number] DOB: 12-Mar-1961 DOA: 02/04/2018  PCP: [name], NP   Patient coming from:   Chief Complaint: Vomiting, Abdominal pain  HPI: [name] is a 57 y.o. female with medical history significant for DM2, gastroparesis, HTN, hypothyroidism, presented to the ED with complaints of vomiting and abdominal pain that started last night.  Multiple episodes of nonbloody emesis.  Abdominal pain is mostly upper abdomen.  Reports associated chills. Denies loose stools.  LAst bowel movement- last night normal consistency and color.   ED Course: stable vitals. Wbc- 12.3.  k- 3.1. Abdominal CT-mild circumferential wall thickening of the descending colon consistent with colitis.  IV ciprofloxacin and metronidazole.  Hospitalist called to admit for colitis.  Review of Systems: As per HPI all other systems reviewed and negative  Past Medical History:  Diagnosis Date  . Allergy    allergic rhinitis  . Arthritis   . Atypical chest pain   . Constipation   . Cyst, ovarian   . Diabetes mellitus, type II ([place])   . Endometriosis   . Esophageal stricture   . Gastroparesis   . GERD (gastroesophageal reflux disease)   . Heart murmur   . Hyperlipidemia   . Hypertension   . 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 Iron deficiency anemia   . Migraine headache   . Non-compliance   . Peptic ulcer disease   . Thyroid disease     Past Surgical History:  Procedure Laterality Date  . ABDOMINAL EXPLORATION SURGERY     w/bso   . CARDIAC CATHETERIZATION  2009   mild non obstructive CAD  . CHOLECYSTECTOMY    . KNEE SURGERY  2005    left knee  . LEFT HEART CATH AND CORONARY ANGIOGRAPHY N/A 07/17/2017   Procedure: LEFT HEART CATH AND CORONARY ANGIOGRAPHY;  Surgeon: Martinique, Peter M, MD;  Location: [facility];  Service: Cardiovascular;  Laterality: N/A;  . TOTAL ABDOMINAL HYSTERECTOMY        reports that she quit smoking about 23 years ago. Her smoking use included cigarettes. She started smoking about 40 years ago. She has a 9.00 pack-year smoking history. She has never used smokeless tobacco. She reports that she drinks alcohol. She reports that she does not use drugs.  Allergies  Allergen Reactions  . Ace Inhibitors     REACTION: chronic cough  . Celebrex [Celecoxib] Other (See Comments)    "makes me bleed"  . Diovan [Valsartan]     angioedema    Family History  Problem Relation Age of Onset  . Hypertension Mother   . Rheum arthritis Mother   . Hypertension Father   . Diabetes Father   . Prostate cancer Father   . Kidney disease Father   . Diabetes Sister   . Fibromyalgia Sister   . Diabetes Maternal Grandmother   . Lung cancer Maternal Grandfather   . Arthritis Brother   . Migraines Brother   . CAD Brother   . Fibromyalgia Sister   . Migraines Sister   . Colon cancer Neg Hx   . Thyroid disease Neg Hx     Prior to Admission medications   Medication Sig Start Date End Date Taking? Authorizing Provider  amLODipine (NORVASC) 5 MG tablet TAKE 1 TABLET BY MOUTH ONCE DAILY 10/21/17   [name], NP  aspirin 81 MG chewable tablet Chew 1 tablet (81 mg total) by mouth  daily. 07/19/17   Samuella Cota, MD  ezetimibe (ZETIA) 10 MG tablet Take 1 tablet (10 mg total) by mouth daily. 08/30/17   Fay Records, MD  hydrochlorothiazide (HYDRODIURIL) 25 MG tablet TAKE 1 TABLET BY MOUTH ONCE DAILY 10/21/17   [name], NP  methimazole (TAPAZOLE) 5 MG tablet TAKE 1 TABLET BY MOUTH THREE TIMES  A WEEK Patient taking differently: Take 5 mg by mouth 3 (three) times daily.  12/16/17   Renato Shin, MD  methocarbamol (ROBAXIN) 500 MG tablet Take 1 tablet (500 mg total) by mouth every 8 (eight) hours as needed for muscle spasms. 12/25/17   [name], NP  nystatin (MYCOSTATIN/NYSTOP) powder Apply topically 2 (two) times daily. 12/17/17   [name], NP  potassium chloride SA (K-DUR,KLOR-CON) 20 MEQ tablet Take 1 tablet (20 mEq total) by mouth daily. 04/19/17   [name], NP  rosuvastatin (CRESTOR) 40 MG tablet Take 1 tablet (40 mg total) by mouth every other day. 10/04/17   [name], NP  SUMAtriptan (IMITREX) 50 MG tablet Take 50 mg by mouth every 2 (two) hours as needed for migraine. May repeat in 2 hours if headache persists or recurs.    [provider]  traMADol (ULTRAM) 50 MG tablet Take 1 tablet (50 mg total) by mouth every 8 (eight) hours as needed. 01/20/18   [name], NP  traZODone (DESYREL) 50 MG tablet Take 0.5-1 tablets (25-50 mg total) by mouth at bedtime as needed for sleep. 04/19/17   [name], NP  atorvastatin (LIPITOR) 40 MG tablet Take 1 tablet (40 mg total) by mouth daily. 08/15/15 08/26/17  [name], NP  famotidine (PEPCID) 20 MG tablet Take 1 tablet (20 mg total) by mouth 2 (two) times daily. 08/17/14 08/26/17  Delos Haring, PA-C    Physical Exam: Vitals:   02/04/18 1053 02/04/18 1152 02/04/18 1200 02/04/18 1341  BP: (!) 168/92 (!) 162/76 (!) 156/73 (!) 155/69  Pulse: 76 67 69 72  Resp: 18 14 15 15   Temp: 98.7 F (37.1 C)     TempSrc: Oral     SpO2: 100% 100% 100% 100%    Constitutional: NAD, calm, comfortable Vitals:   02/04/18 1053 02/04/18 1152 02/04/18 1200 02/04/18 1341  BP: (!) 168/92 (!) 162/76 (!) 156/73 (!) 155/69  Pulse: 76 67 69 72  Resp: 18 14 15 15   Temp: 98.7 F (37.1 C)     TempSrc: Oral     SpO2: 100% 100% 100% 100%   Eyes: PERRL, lids and conjunctivae normal ENMT: Mucous membranes are dry. Posterior pharynx clear of any exudate or lesions. Neck: normal, supple, no masses, no thyromegaly Respiratory: clear to auscultation bilaterally, no wheezing, no crackles. Normal respiratory effort. No accessory muscle use.  Cardiovascular: Regular rate and rhythm, no murmurs / rubs / gallops. No extremity edema. 2+ pedal  pulses. Abdomen: no tenderness, no masses palpated. No hepatosplenomegaly. Bowel sounds positive.  Musculoskeletal: no clubbing / cyanosis. No joint deformity upper and lower extremities. Good ROM, no contractures. Normal muscle tone.  Skin: no rashes, lesions, ulcers. No induration Neurologic: CN 2-12 grossly intact.  Strength 5/5 in all 4.  Psychiatric: Normal judgment and insight. Alert and oriented x 3. Normal mood.   Labs on Admission: I have personally reviewed following labs and imaging studies  CBC: Recent Labs  Lab 01/30/18 1220 02/04/18 1110  WBC 6.6 12.3*  NEUTROABS 3.4 9.9*  HGB 11.5* 12.1  HCT 37.7 37.6  MCV 74.4* 72.4*  PLT  199 229   Basic Metabolic Panel: Recent Labs  Lab 01/30/18 1220 02/04/18 1110  NA 141 141  K 4.1 3.1*  CL 102 102  CO2 27 25  GLUCOSE 105* 160*  BUN 11 18  CREATININE 0.85 1.01*  CALCIUM 9.4 9.6   Liver Function Tests: Recent Labs  Lab 01/30/18 1220 02/04/18 1110  AST 24 29  ALT 30 31  ALKPHOS 134* 127*  BILITOT 0.4 0.6  PROT 7.7 8.5*  ALBUMIN 3.8 4.3   Recent Labs  Lab 02/04/18 1110  LIPASE 29   Urine analysis:    Component Value Date/Time   COLORURINE STRAW (A) 02/04/2018 1341   APPEARANCEUR CLEAR 02/04/2018 1341   LABSPEC >1.046 (H) 02/04/2018 1341   LABSPEC 1.030 09/17/2012 1119   PHURINE 8.0 02/04/2018 1341   GLUCOSEU NEGATIVE 02/04/2018 1341   GLUCOSEU NEGATIVE 11/15/2010 1518   HGBUR NEGATIVE 02/04/2018 1341   HGBUR trace-intact 11/01/2009 0000   BILIRUBINUR NEGATIVE 02/04/2018 1341   BILIRUBINUR Neg 02/02/2016 1254   KETONESUR 5 (A) 02/04/2018 1341   PROTEINUR NEGATIVE 02/04/2018 1341   UROBILINOGEN 4.0 02/02/2016 1254   UROBILINOGEN 0.2 08/17/2014 0550   UROBILINOGEN 0.2 09/17/2012 1119   NITRITE NEGATIVE 02/04/2018 1341   LEUKOCYTESUR NEGATIVE 02/04/2018 1341    Radiological Exams on Admission: Ct Abdomen Pelvis W Contrast  Result Date: 02/04/2018 CLINICAL DATA:  Diffuse abdominal pain for the  past 2 days. EXAM: CT ABDOMEN AND PELVIS WITH CONTRAST TECHNIQUE: Multidetector CT imaging of the abdomen and pelvis was performed using the standard protocol following bolus administration of intravenous contrast. CONTRAST:  139mL ISOVUE-300 IOPAMIDOL (ISOVUE-300) INJECTION 61% COMPARISON:  CT abdomen pelvis dated October 05, 2011. FINDINGS: Lower chest: No acute abnormality. Slightly patulous distal esophagus. Hepatobiliary: Interval enlargement of the right hepatic lobe hemangioma, now measuring up to 8.5 cm, previously 7.4 cm. No new focal liver abnormality. Status post cholecystectomy. No biliary dilatation. Pancreas: Unremarkable. No pancreatic ductal dilatation or surrounding inflammatory changes. Spleen: Normal in size without focal abnormality. Adrenals/Urinary Tract: Adrenal glands are unremarkable. Kidneys are normal, without renal calculi, focal lesion, or hydronephrosis. Bladder is unremarkable. Stomach/Bowel: Small hiatal hernia. The stomach is otherwise within normal limits. There is mild circumferential wall thickening of the descending colon. The remaining colon and small bowel are unremarkable. No obstruction. Normal appendix. Vascular/Lymphatic: Aortic atherosclerosis. No enlarged abdominal or pelvic lymph nodes. Reproductive: Status post hysterectomy. No adnexal masses. Other: No abdominal wall hernia or abnormality. No abdominopelvic ascites. No pneumoperitoneum. Musculoskeletal: 1.9 cm area of fat necrosis in the right breast. No acute or significant osseous finding. IMPRESSION: 1. Mild circumferential wall thickening of the descending colon, consistent with colitis. 2. Mild interval enlargement of the right hepatic lobe hemangioma. 3.  Aortic atherosclerosis (ICD10-I70.0). Electronically Signed   By: Titus Dubin M.D.   On: 02/04/2018 12:48   Dg Chest Port 1 View  Result Date: 02/04/2018 CLINICAL DATA:  Vomiting. EXAM: PORTABLE CHEST 1 VIEW COMPARISON:  Chest x-ray dated 07/16/2017  FINDINGS: The heart size and mediastinal contours are within normal limits. Both lungs are clear. The visualized skeletal structures are unremarkable. No visible free air under the diaphragm. IMPRESSION: Normal exam. Electronically Signed   By: Lorriane Shire M.D.   On: 02/04/2018 11:53   EKG: Independently reviewed.  Prolonged QTC 501.  Sinus rhythm.  Nonspecific T wave changes V2 V3.  Assessment/Plan Principal Problem:   Colitis Active Problems:   Essential hypertension, benign   ESOPHAGEAL STRICTURE   Gastroparesis   Fibromyalgia  Borderline type 2 diabetes mellitus   Hyperthyroidism   Colitis-vomiting epigastric pain.  No loose stools. WBC 12.3.  Does not meet SIRS criteria.  Likely element of gastroparesis contributing to pain and vomiting.Iv cipro and Metronid started in Ed. - Will use IV zosyn per pharm, considering prolonged QTc.  -IV fluid normal saline +40 KCl 100cc/hr X 15 hrs -Bowel rest with clear liquid diet -Morphine, Phenergan, called persistent vomiting, will try metoclopramide - BMP, CBC a.m  Hypokalemia- K- 3.1.  Likely secondary to colitis -Magnesium' -Replete, BMP a.m.  Borderline DM, with gastroparesis history-glucose 160.  No recent A1c on file, not on anti-hypoglycemic agents. - SSi  HTN-elevated, systolic 219X to 588T -Continue home Norvasc pending med reconciliation, hold home HCTZ while hydrating resume in a.m. -PRN hydralazine  Hyperthyroidism-  Last TSH 12/17/2017-0.75. - continue home methimazole.  Prolonged QTC-501.  In the setting of hypokalemia- 3.1 -EKG a.m. -Check magnesium - Catious use of anti-emetics  Esophageal stricture history-no swallowing problems at this time  DVT prophylaxis: Lovenox Code Status: Full Family Communication: Daughter Monic at bedside Disposition Plan: 1-2 days Consults called: None Admission status: obs, with cardiac monitoring with prolonged QTc   Bethena Roys MD [facility] Pager [phone] From 6PM-2AM.  Otherwise please contact night-coverage [email or web address] Password [email or web address]  02/04/2018, 2:21 PM

## 2018-02-04 NOTE — ED Provider Notes (Signed)
[place] DEPT Provider Note   CSN: [id number] Arrival date & time: 02/04/18  1040     History   Chief Complaint Chief Complaint  Patient presents with  . Abdominal Pain  . Nausea    HPI [name] is a 57 y.o. female history of diabetes, esophageal stricture, reflux, peptic ulcer disease here presenting with vomiting, abdominal pain.  Patient states that she has been having epigastric pain since yesterday.  States that she has been having numerous episodes of nonbilious and nonbloody vomiting since last night.  Patient states that this morning she had an episode of blood-streaked vomit.  She states that she was unable to keep anything down.  Denies any diarrhea or fevers.  Last bowel movement was yesterday.  Patient was given Zofran 8 mg by EMS and still feeling nauseated and in severe pain.   The history is provided by the patient.    Past Medical History:  Diagnosis Date  . Allergy    allergic rhinitis  . Arthritis   . Atypical chest pain   . Constipation   . Cyst, ovarian   . Diabetes mellitus, type II ([place])   . Endometriosis   . Esophageal stricture   . Gastroparesis   . GERD (gastroesophageal reflux disease)   . Heart murmur   . Hyperlipidemia   . Hypertension   . 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 Iron deficiency anemia   . Migraine headache   . Non-compliance   . Peptic ulcer disease   . Thyroid disease     Patient Active Problem List   Diagnosis Date Noted  . Aortic atherosclerosis ([place]) 07/17/2017  . Chest pain 07/16/2017  . Insomnia 01/12/2017  . Hyperglycemia 10/07/2014  . HTN (hypertension) 09/18/2012  . Breast cyst 06/09/2012  . Hyperthyroidism 02/29/2012  . Unspecified constipation 04/18/2011  . Can't get food down 04/18/2011  . Ulnar neuropathy 03/30/2011  . Low back pain 10/18/2010  . FIBROMYALGIA 07/20/2009  . Hyperlipidemia 05/26/2009  .  Vitamin D deficiency 05/23/2009  . Iron deficiency anemia due to chronic blood loss 10/25/2008  . GERD 10/25/2008  . LIVER HEMANGIOMA 01/01/2008  . ESOPHAGEAL STRICTURE 01/01/2008  . GASTROPARESIS 01/01/2008  . ALLERGIC RHINITIS 12/08/2007  . Borderline type 2 diabetes mellitus 10/31/2007  . Migraine headache 08/11/2007  . HIATAL HERNIA 08/11/2007  . OVARIAN CYST 08/11/2007  . Essential hypertension, benign 05/06/2007    Past Surgical History:  Procedure Laterality Date  . ABDOMINAL EXPLORATION SURGERY     w/bso   . CARDIAC CATHETERIZATION  2009   mild non obstructive CAD  . CHOLECYSTECTOMY    . KNEE SURGERY  2005    left knee  . LEFT HEART CATH AND CORONARY ANGIOGRAPHY N/A 07/17/2017   Procedure: LEFT HEART CATH AND CORONARY ANGIOGRAPHY;  Surgeon: Martinique, Peter M, MD;  Location: [facility];  Service: Cardiovascular;  Laterality: N/A;  . TOTAL ABDOMINAL HYSTERECTOMY       OB History   None      Home Medications    Prior to Admission medications   Medication Sig Start Date End Date Taking? Authorizing Provider  amLODipine (NORVASC) 5 MG tablet TAKE 1 TABLET BY MOUTH ONCE DAILY 10/21/17   [name], NP  aspirin 81 MG chewable tablet Chew 1 tablet (81 mg total) by mouth daily. 07/19/17   Samuella Cota, MD  ezetimibe (ZETIA) 10 MG tablet Take 1 tablet (10  mg total) by mouth daily. 08/30/17   Fay Records, MD  hydrochlorothiazide (HYDRODIURIL) 25 MG tablet TAKE 1 TABLET BY MOUTH ONCE DAILY 10/21/17   [name], NP  methimazole (TAPAZOLE) 5 MG tablet TAKE 1 TABLET BY MOUTH THREE TIMES  A WEEK 12/16/17   Renato Shin, MD  methocarbamol (ROBAXIN) 500 MG tablet Take 1 tablet (500 mg total) by mouth every 8 (eight) hours as needed for muscle spasms. 12/25/17   [name], NP  nystatin (MYCOSTATIN/NYSTOP) powder Apply topically 2 (two) times daily. 12/17/17   [name], NP  potassium chloride SA (K-DUR,KLOR-CON) 20 MEQ tablet Take 1 tablet  (20 mEq total) by mouth daily. 04/19/17   [name], NP  rosuvastatin (CRESTOR) 40 MG tablet Take 1 tablet (40 mg total) by mouth every other day. 10/04/17   [name], NP  SUMAtriptan (IMITREX) 50 MG tablet Take 50 mg by mouth every 2 (two) hours as needed for migraine. May repeat in 2 hours if headache persists or recurs.    [provider]  traMADol (ULTRAM) 50 MG tablet Take 1 tablet (50 mg total) by mouth every 8 (eight) hours as needed. 01/20/18   [name], NP  traZODone (DESYREL) 50 MG tablet Take 0.5-1 tablets (25-50 mg total) by mouth at bedtime as needed for sleep. 04/19/17   [name], NP  atorvastatin (LIPITOR) 40 MG tablet Take 1 tablet (40 mg total) by mouth daily. 08/15/15 08/26/17  [name], NP  famotidine (PEPCID) 20 MG tablet Take 1 tablet (20 mg total) by mouth 2 (two) times daily. 08/17/14 08/26/17  Delos Haring, PA-C    Family History Family History  Problem Relation Age of Onset  . Hypertension Mother   . Rheum arthritis Mother   . Hypertension Father   . Diabetes Father   . Prostate cancer Father   . Kidney disease Father   . Diabetes Sister   . Fibromyalgia Sister   . Diabetes Maternal Grandmother   . Lung cancer Maternal Grandfather   . Arthritis Brother   . Migraines Brother   . CAD Brother   . Fibromyalgia Sister   . Migraines Sister   . Colon cancer Neg Hx   . Thyroid disease Neg Hx     Social History Social History   Tobacco Use  . Smoking status: Former Smoker    Packs/day: 0.50    Years: 18.00    Pack years: 9.00    Types: Cigarettes    Start date: 07/29/1977    Last attempt to quit: 06/25/1994    Years since quitting: 23.6  . Smokeless tobacco: Never Used  . Tobacco comment: quit 19 years ago  Substance Use Topics  . Alcohol use: Yes    Alcohol/week: 0.0 standard drinks    Comment: social drinker  . Drug use: No     Allergies   Ace inhibitors; Celebrex [celecoxib]; and Diovan  [valsartan]   Review of Systems Review of Systems  Gastrointestinal: Positive for abdominal pain and vomiting.  All other systems reviewed and are negative.    Physical Exam Updated Vital Signs BP (!) 168/92 (BP Location: Left Arm)   Pulse 76   Temp 98.7 F (37.1 C) (Oral)   Resp 18   SpO2 100%   Physical Exam  Constitutional: She is oriented to person, place, and time.  Uncomfortable   HENT:  Head: Normocephalic.  Mouth/Throat: Oropharynx is clear and moist.  Eyes: Pupils are equal, round, and reactive to light. EOM are  normal.  Cardiovascular: Normal rate, regular rhythm and normal heart sounds.  Pulmonary/Chest: Effort normal and breath sounds normal.  Abdominal:  + epigastric and LUQ tenderness  Neurological: She is alert and oriented to person, place, and time.  Skin: Skin is warm. Capillary refill takes less than 2 seconds.  Psychiatric: She has a normal mood and affect. Her behavior is normal.  Nursing note and vitals reviewed.    ED Treatments / Results  Labs (all labs ordered are listed, but only abnormal results are displayed) Labs Reviewed  CBC WITH DIFFERENTIAL/PLATELET  COMPREHENSIVE METABOLIC PANEL  LIPASE, BLOOD  URINALYSIS, ROUTINE W REFLEX MICROSCOPIC  I-STAT TROPONIN, ED  TYPE AND SCREEN    EKG EKG Interpretation  Date/Time:  Tuesday February 04 2018 10:58:55 EDT Ventricular Rate:  72 PR Interval:    QRS Duration: 106 QT Interval:  457 QTC Calculation: 501 R Axis:   -8 Text Interpretation:  Sinus rhythm Abnormal R-wave progression, early transition Borderline T abnormalities, anterior leads Borderline prolonged QT interval No significant change since last tracing Confirmed by Wandra Arthurs [phone]) on 02/04/2018 11:21:48 AM   Radiology No results found.  Procedures Procedures (including critical care time)  Medications Ordered in ED Medications  sodium chloride 0.9 % bolus 1,000 mL (1,000 mLs Intravenous New Bag/Given 02/04/18 1116)    promethazine (PHENERGAN) injection 25 mg (25 mg Intravenous Given 02/04/18 1111)  morphine 4 MG/ML injection 4 mg (4 mg Intravenous Given 02/04/18 1111)     Initial Impression / Assessment and Plan / ED Course  I have reviewed the triage vital signs and the nursing notes.  Pertinent labs & imaging results that were available during my care of the patient were reviewed by me and considered in my medical decision making (see chart for details).     [name] is a 57 y.o. female here with abdominal pain, blood in vomit. Has hx of reflux, esophageal strictures. Concern for gastritis with bleeding vs boerhaave vs bleeding from strictures vs SBO. Will get labs, CXR, CT ab/pel. Will give pepcid, antiemetics and likely admit.   1:21 PM Hg 12. WBC 13. CT ab/pel showed colitis with no complications. Given zofran by EMS. Given pepcid, phenergan, reglan but still nauseated and dry heaving. Ordered cipro, flagyl. Will admit for intractable vomiting from colitis.     Final Clinical Impressions(s) / ED Diagnoses   Final diagnoses:  None    ED Discharge Orders    None       Drenda Freeze, MD 02/04/18 1322

## 2018-02-04 NOTE — ED Notes (Signed)
1st set of blood cultures drawn and sent down to lab before antibiotics given . No order for cultures yet.

## 2018-02-04 NOTE — ED Notes (Signed)
X-ray at bedside

## 2018-02-04 NOTE — ED Triage Notes (Signed)
Patient arrived via [facility] from home. C/o abd. X2 days. Noticed streak of blood in vomit. Gall bladder removed 2 years ago. Pain in same place. Pain all over 4 quadrants and upper gastric pain. Patient c/o N/V. 8mg  of zofran given per ems. 18g L-FA.

## 2018-02-04 NOTE — Progress Notes (Signed)
Pharmacy Antibiotic Note  [name] is a 57 y.o. female admitted on 02/04/2018 with colitis.  Patient received Cipro 400mg  IV x 1 (@ 14:31) and Metronidazole 500mg  IV x 1 (@ 14:41). Patient with noted prolonged QTC, so avoiding quinolones.  Pharmacy has been consulted for Zosyn dosing.  Plan: Zosyn 3.375g IV q8h (4 hour infusion).  Need for further dosage adjustment appears unlikely at present.    Will sign off at this time.  Please reconsult if a change in clinical status warrants re-evaluation of dosage.   Height: 5\' 4"  (162.6 cm) Weight: 218 lb 0.6 oz (98.9 kg) IBW/kg (Calculated) : 54.7  Temp (24hrs), Avg:98.7 F (37.1 C), Min:98.7 F (37.1 C), Max:98.7 F (37.1 C)  Recent Labs  Lab 01/30/18 1220 02/04/18 1110  WBC 6.6 12.3*  CREATININE 0.85 1.01*    Estimated Creatinine Clearance: 68.5 mL/min (A) (by C-G formula based on SCr of 1.01 mg/dL (H)).    Allergies  Allergen Reactions  . Ace Inhibitors     REACTION: chronic cough  . Celebrex [Celecoxib] Other (See Comments)    "makes me bleed"  . Diovan [Valsartan]     angioedema     Thank you for allowing pharmacy to be a part of this patient's care.  Everette Rank, PharmD 02/04/2018 7:04 PM

## 2018-02-05 ENCOUNTER — Other Ambulatory Visit: Payer: Self-pay

## 2018-02-05 DIAGNOSIS — E0590 Thyrotoxicosis, unspecified without thyrotoxic crisis or storm: Secondary | ICD-10-CM | POA: Diagnosis present

## 2018-02-05 DIAGNOSIS — Z8711 Personal history of peptic ulcer disease: Secondary | ICD-10-CM | POA: Diagnosis not present

## 2018-02-05 DIAGNOSIS — Z7982 Long term (current) use of aspirin: Secondary | ICD-10-CM | POA: Diagnosis not present

## 2018-02-05 DIAGNOSIS — E876 Hypokalemia: Secondary | ICD-10-CM | POA: Diagnosis present

## 2018-02-05 DIAGNOSIS — K3184 Gastroparesis: Secondary | ICD-10-CM | POA: Diagnosis present

## 2018-02-05 DIAGNOSIS — K529 Noninfective gastroenteritis and colitis, unspecified: Secondary | ICD-10-CM

## 2018-02-05 DIAGNOSIS — I2510 Atherosclerotic heart disease of native coronary artery without angina pectoris: Secondary | ICD-10-CM | POA: Diagnosis present

## 2018-02-05 DIAGNOSIS — Z9049 Acquired absence of other specified parts of digestive tract: Secondary | ICD-10-CM | POA: Diagnosis not present

## 2018-02-05 DIAGNOSIS — R1084 Generalized abdominal pain: Secondary | ICD-10-CM | POA: Diagnosis not present

## 2018-02-05 DIAGNOSIS — R1012 Left upper quadrant pain: Secondary | ICD-10-CM | POA: Diagnosis not present

## 2018-02-05 DIAGNOSIS — R935 Abnormal findings on diagnostic imaging of other abdominal regions, including retroperitoneum: Secondary | ICD-10-CM | POA: Diagnosis not present

## 2018-02-05 DIAGNOSIS — I10 Essential (primary) hypertension: Secondary | ICD-10-CM | POA: Diagnosis present

## 2018-02-05 DIAGNOSIS — Z833 Family history of diabetes mellitus: Secondary | ICD-10-CM | POA: Diagnosis not present

## 2018-02-05 DIAGNOSIS — K5900 Constipation, unspecified: Secondary | ICD-10-CM | POA: Diagnosis not present

## 2018-02-05 DIAGNOSIS — D509 Iron deficiency anemia, unspecified: Secondary | ICD-10-CM | POA: Diagnosis present

## 2018-02-05 DIAGNOSIS — E1143 Type 2 diabetes mellitus with diabetic autonomic (poly)neuropathy: Secondary | ICD-10-CM | POA: Diagnosis present

## 2018-02-05 DIAGNOSIS — Z87891 Personal history of nicotine dependence: Secondary | ICD-10-CM | POA: Diagnosis not present

## 2018-02-05 DIAGNOSIS — M797 Fibromyalgia: Secondary | ICD-10-CM | POA: Diagnosis present

## 2018-02-05 DIAGNOSIS — K219 Gastro-esophageal reflux disease without esophagitis: Secondary | ICD-10-CM | POA: Diagnosis present

## 2018-02-05 DIAGNOSIS — Z90710 Acquired absence of both cervix and uterus: Secondary | ICD-10-CM | POA: Diagnosis not present

## 2018-02-05 DIAGNOSIS — R112 Nausea with vomiting, unspecified: Secondary | ICD-10-CM | POA: Diagnosis present

## 2018-02-05 LAB — CBC
HCT: 36.8 % (ref 36.0–46.0)
Hemoglobin: 11.2 g/dL — ABNORMAL LOW (ref 12.0–15.0)
MCH: 22.6 pg — ABNORMAL LOW (ref 26.0–34.0)
MCHC: 30.4 g/dL (ref 30.0–36.0)
MCV: 74.3 fL — ABNORMAL LOW (ref 78.0–100.0)
Platelets: 214 10*3/uL (ref 150–400)
RBC: 4.95 MIL/uL (ref 3.87–5.11)
RDW: 14.2 % (ref 11.5–15.5)
WBC: 7.6 10*3/uL (ref 4.0–10.5)

## 2018-02-05 LAB — BASIC METABOLIC PANEL
Anion gap: 7 (ref 5–15)
BUN: 11 mg/dL (ref 6–20)
CO2: 27 mmol/L (ref 22–32)
Calcium: 8.3 mg/dL — ABNORMAL LOW (ref 8.9–10.3)
Chloride: 107 mmol/L (ref 98–111)
Creatinine, Ser: 0.83 mg/dL (ref 0.44–1.00)
GFR calc Af Amer: >60 mL/min (ref >60)
GFR calc non Af Amer: >60 mL/min (ref >60)
Glucose, Bld: 98 mg/dL (ref 70–99)
Potassium: 3.4 mmol/L — ABNORMAL LOW (ref 3.5–5.1)
Sodium: 141 mmol/L (ref 135–145)

## 2018-02-05 LAB — GLUCOSE, CAPILLARY
Glucose-Capillary: 84 mg/dL (ref 70–99)
Glucose-Capillary: 134 mg/dL — ABNORMAL HIGH (ref 70–99)
Glucose-Capillary: 85 mg/dL (ref 70–99)
Glucose-Capillary: 94 mg/dL (ref 70–99)

## 2018-02-05 LAB — LIPASE, BLOOD: Lipase: 25 U/L (ref 11–51)

## 2018-02-05 MED ORDER — LACTATED RINGERS IV SOLN
INTRAVENOUS | Status: DC
  Administered 2018-02-05 – 2018-02-08 (×5): via INTRAVENOUS

## 2018-02-05 MED ORDER — PROMETHAZINE HCL 25 MG/ML IJ SOLN
12.5000 mg | INTRAMUSCULAR | Status: DC | PRN
  Administered 2018-02-05 – 2018-02-07 (×8): 12.5 mg via INTRAVENOUS
  Filled 2018-02-05 (×8): qty 1

## 2018-02-05 MED ORDER — METOCLOPRAMIDE HCL 5 MG/ML IJ SOLN: 5.0000 mg | Freq: Four times a day (QID) | INTRAMUSCULAR | Status: DC | PRN

## 2018-02-05 MED ORDER — INSULIN ASPART 100 UNIT/ML ~~LOC~~ SOLN
0.0000 [IU] | SUBCUTANEOUS | Status: DC
  Administered 2018-02-05: 1 [IU] via SUBCUTANEOUS

## 2018-02-05 MED ORDER — SODIUM CHLORIDE 0.9 % IV SOLN
3.0000 g | Freq: Four times a day (QID) | INTRAVENOUS | Status: DC
  Administered 2018-02-05 – 2018-02-07 (×7): 3 g via INTRAVENOUS
  Filled 2018-02-05 (×9): qty 3

## 2018-02-05 MED ORDER — FAMOTIDINE 20 MG PO TABS
20.0000 mg | ORAL_TABLET | Freq: Two times a day (BID) | ORAL | Status: DC
  Administered 2018-02-05 – 2018-02-08 (×7): 20 mg via ORAL
  Filled 2018-02-05 (×7): qty 1

## 2018-02-05 MED ORDER — POTASSIUM CHLORIDE CRYS ER 20 MEQ PO TBCR
40.0000 meq | EXTENDED_RELEASE_TABLET | Freq: Once | ORAL | Status: AC
  Administered 2018-02-05: 40 meq via ORAL
  Filled 2018-02-05: qty 2

## 2018-02-05 NOTE — Care Management Obs Status (Signed)
[place] NOTIFICATION   Patient Details  Name: [name] MRN: [id number] Date of Birth: 1960/10/31   Medicare Observation Status Notification Given:  Yes    Leeroy Cha, RN 02/05/2018, 11:47 AM

## 2018-02-05 NOTE — Progress Notes (Signed)
[facility] Progress Note  Patient: [name] ATF:[id number]   PCP: [name], NP DOB: 12-Jul-1960   DOA: 02/04/2018   DOS: 02/05/2018   Date of Service: the patient was seen and examined on 02/05/2018  Subjective: still nauseated and has severe abdominal pain, no fever, no diarrhea. Passing gas  Brief hospital course: Pt. with PMH of DM2, gastroparesis, HTN, hypothyroidis; admitted on 02/04/2018, presented with complaint of abdominal pain, was found to have colitis. Currently further plan is continue IV Antibiotics.  Assessment and Plan: Colitis-vomiting epigastric pain. No loose stools.  WBC 12.3.  Does not meet SIRS criteria.   Still has severe pain and nausea. Dry heaves no vomiting for now  Likely element of gastroparesis contributing to pain and vomiting.  - was on IV zosyn per pharm, change to unasyn  -Bowel rest with clear liquid diet -Morphine, Phenergan,  Lipase normalx2 If no improvement in pain then we will consult gastroenterology   Hypokalemia- K- 3.1.  Likely secondary to colitis -Magnesium' -Replete, BMP a.m.  Borderline DM, with gastroparesis history-glucose 160.  No recent A1c on file, not on anti-hypoglycemic agents. Recheck A1c - SSi  HTN-elevated, systolic 270W to 237S -Continue home Norvasc pending med reconciliation, hold home HCTZ while hydrating resume in a.m. -PRN hydralazine  Hyperthyroidism-  Last TSH 12/17/2017-0.75. - continue home methimazole.  Prolonged QTC-501.  In the setting of hypokalemia- 3.1 Resolved   Esophageal stricture history-no swallowing problems at this time  Diet: clear iquid diet DVT Prophylaxis: subcutaneous Heparin  Advance goals of care discussion: full code  Family Communication: family was present at bedside, at the time of interview. The pt provided permission to discuss medical plan with the family. Opportunity was given to ask question and all questions were answered satisfactorily.    Disposition:  Discharge to home.  Consultants: none Procedures: none  Antibiotics: Anti-infectives (From admission, onward)   Start     Dose/Rate Route Frequency Ordered Stop   02/05/18 1800  Ampicillin-Sulbactam (UNASYN) 3 g in sodium chloride 0.9 % 100 mL IVPB     3 g 200 mL/hr over 30 Minutes Intravenous Every 6 hours 02/05/18 1012     02/05/18 0300  ciprofloxacin (CIPRO) IVPB 400 mg  Status:  Discontinued     400 mg 200 mL/hr over 60 Minutes Intravenous Every 12 hours 02/04/18 1545 02/04/18 1855   02/04/18 2300  metroNIDAZOLE (FLAGYL) IVPB 500 mg  Status:  Discontinued     500 mg 100 mL/hr over 60 Minutes Intravenous Every 8 hours 02/04/18 1545 02/04/18 1855   02/04/18 2300  piperacillin-tazobactam (ZOSYN) IVPB 3.375 g  Status:  Discontinued     3.375 g 12.5 mL/hr over 240 Minutes Intravenous Every 8 hours 02/04/18 1908 02/05/18 0919   02/04/18 1315  ciprofloxacin (CIPRO) IVPB 400 mg     400 mg 200 mL/hr over 60 Minutes Intravenous  Once 02/04/18 1313 02/04/18 1437   02/04/18 1315  metroNIDAZOLE (FLAGYL) IVPB 500 mg     500 mg 100 mL/hr over 60 Minutes Intravenous  Once 02/04/18 1313 02/04/18 1541       Objective: Physical Exam: Vitals:   02/04/18 1613 02/04/18 1931 02/05/18 0615 02/05/18 1410  BP:  104/62 134/75 128/83  Pulse:  (!) 58 (!) 55 (!) 55  Resp:  20 14 16   Temp:  98.3 F (36.8 C) 98.7 F (37.1 C) 98.7 F (37.1 C)  TempSrc:   Oral   SpO2:  98% 98% 100%  Weight: 98.9 kg  Height: 5\' 4"  (1.626 m)       Intake/Output Summary (Last 24 hours) at 02/05/2018 1809 Last data filed at 02/05/2018 1754 Gross per 24 hour  Intake 1717.67 ml  Output -  Net 1717.67 ml   Filed Weights   02/04/18 1613  Weight: 98.9 kg   General: Alert, Awake and Oriented to Time, Place and Person. Appear in moderate distress, affect appropriate Eyes: PERRL, Conjunctiva normal ENT: Oral Mucosa clear moist. Neck: no JVD, no Abnormal Mass Or lumps Cardiovascular: S1 and S2  Present, no Murmur, Peripheral Pulses Present Respiratory: normal respiratory effort, Bilateral Air entry equal and Decreased, no use of accessory muscle, Clear to Auscultation, no Crackles, no wheezes Abdomen: Bowel Sound present, Soft and moderate tenderness, no hernia Skin: no redness, no Rash, no induration Extremities: no Pedal edema, no calf tenderness Neurologic: Grossly no focal neuro deficit. Bilaterally Equal motor strength  Data Reviewed: CBC: Recent Labs  Lab 01/30/18 1220 02/04/18 1110 02/05/18 0606  WBC 6.6 12.3* 7.6  NEUTROABS 3.4 9.9*  --   HGB 11.5* 12.1 11.2*  HCT 37.7 37.6 36.8  MCV 74.4* 72.4* 74.3*  PLT 199 243 073   Basic Metabolic Panel: Recent Labs  Lab 01/30/18 1220 02/04/18 1110 02/05/18 0606  NA 141 141 141  K 4.1 3.1* 3.4*  CL 102 102 107  CO2 27 25 27   GLUCOSE 105* 160* 98  BUN 11 18 11   CREATININE 0.85 1.01* 0.83  CALCIUM 9.4 9.6 8.3*  MG  --  1.9  --     Liver Function Tests: Recent Labs  Lab 01/30/18 1220 02/04/18 1110  AST 24 29  ALT 30 31  ALKPHOS 134* 127*  BILITOT 0.4 0.6  PROT 7.7 8.5*  ALBUMIN 3.8 4.3   Recent Labs  Lab 02/04/18 1110 02/05/18 0606  LIPASE 29 25   No results for input(s): AMMONIA in the last 168 hours. Coagulation Profile: No results for input(s): INR, PROTIME in the last 168 hours. Cardiac Enzymes: No results for input(s): CKTOTAL, CKMB, CKMBINDEX, TROPONINI in the last 168 hours. BNP (last 3 results) No results for input(s): PROBNP in the last 8760 hours. CBG: Recent Labs  Lab 02/04/18 1624 02/04/18 2132 02/05/18 0734 02/05/18 1123 02/05/18 1631  GLUCAP 89 154* 84 134* 85   Studies: No results found.  Scheduled Meds: . enoxaparin (LOVENOX) injection  50 mg Subcutaneous Q24H  . famotidine  20 mg Oral BID  . insulin aspart  0-9 Units Subcutaneous Q4H   Continuous Infusions: . ampicillin-sulbactam (UNASYN) IV Stopped (02/05/18 1746)  . lactated ringers 75 mL/hr at 02/05/18 1754    PRN Meds: acetaminophen **OR** acetaminophen, hydrALAZINE, morphine injection, promethazine  Time spent: 35 minutes  Author: Berle Mull, MD [facility] Pager: [phone] 02/05/2018 6:09 PM  If 7PM-7AM, please contact night-coverage at [email or web address], password [facility]

## 2018-02-05 NOTE — Care Management Note (Signed)
Case Management Note  Patient Details  Name: [name] MRN: [id number] Date of Birth: July 07, 1960  Subjective/Objective:         Iv unasyn and lr at 75cc/hrs   57 y.o. female with medical history significant for DM2, gastroparesis, HTN, hypothyroidism, presented to the ED with complaints of vomiting and abdominal pain that started last night.  Multiple episodes of nonbloody emesis.  Abdominal pain is mostly upper abdomen.  Reports associated chills. Denies loose stools.  LAst bowel movement- last night normal consistency and color.          Action/Plan: Will follow for medical progression and cm needs/from home and is independent  Expected Discharge Date:  (unknown)               Expected Discharge Plan:  Home/Self Care  In-House Referral:     Discharge planning Services  CM Consult  Post Acute Care Choice:    Choice offered to:     DME Arranged:    DME Agency:     HH Arranged:    HH Agency:     Status of Service:  In process, will continue to follow  If discussed at Long Length of Stay Meetings, dates discussed:    Additional Comments:  Leeroy Cha, RN 02/05/2018, 10:41 AM

## 2018-02-05 NOTE — Progress Notes (Signed)
Pharmacy Antibiotic Note  [name] is a 57 y.o. female admitted on 02/04/2018 with colitis.  Pharmacy has been consulted for unasyn dosing.  Plan: Unasyn 3 gm IV q6h Pharmacy to sign off  Height: 5\' 4"  (162.6 cm) Weight: 218 lb 0.6 oz (98.9 kg) IBW/kg (Calculated) : 54.7  Temp (24hrs), Avg:98.6 F (37 C), Min:98.3 F (36.8 C), Max:98.7 F (37.1 C)  Recent Labs  Lab 01/30/18 1220 02/04/18 1110 02/05/18 0606  WBC 6.6 12.3* 7.6  CREATININE 0.85 1.01* 0.83    Estimated Creatinine Clearance: 83.4 mL/min (by C-G formula based on SCr of 0.83 mg/dL).    Allergies  Allergen Reactions  . Ace Inhibitors     REACTION: chronic cough  . Celebrex [Celecoxib] Other (See Comments)    "makes me bleed"  . Diovan [Valsartan]     angioedema  Antimicrobials this admission:  8/13 cipro x1 8/13 flagyl x 1 8/13 zosyn>>8/14 8/14 unasyn>> Dose adjustments this admission:  none Microbiology results:  none  Thank you for allowing pharmacy to be a part of this patient's care. Eudelia Bunch, Pharm.D [phone] 02/05/2018 10:11 AM

## 2018-02-05 NOTE — Care Management Obs Status (Signed)
[place] NOTIFICATION   Patient Details  Name: [name] MRN: [id number] Date of Birth: December 02, 1960   Medicare Observation Status Notification Given:  Yes    Leeroy Cha, RN 02/05/2018, 11:48 AM

## 2018-02-06 DIAGNOSIS — R935 Abnormal findings on diagnostic imaging of other abdominal regions, including retroperitoneum: Secondary | ICD-10-CM

## 2018-02-06 DIAGNOSIS — R1012 Left upper quadrant pain: Secondary | ICD-10-CM

## 2018-02-06 DIAGNOSIS — R1084 Generalized abdominal pain: Secondary | ICD-10-CM

## 2018-02-06 LAB — GLUCOSE, CAPILLARY
Glucose-Capillary: 69 mg/dL — ABNORMAL LOW (ref 70–99)
Glucose-Capillary: 73 mg/dL (ref 70–99)
Glucose-Capillary: 83 mg/dL (ref 70–99)
Glucose-Capillary: 91 mg/dL (ref 70–99)
Glucose-Capillary: 93 mg/dL (ref 70–99)
Glucose-Capillary: 94 mg/dL (ref 70–99)

## 2018-02-06 LAB — HEMOGLOBIN A1C
Hgb A1c MFr Bld: 6.1 % — ABNORMAL HIGH (ref 4.8–5.6)
Mean Plasma Glucose: 128.37 mg/dL

## 2018-02-06 MED ORDER — POTASSIUM CHLORIDE CRYS ER 20 MEQ PO TBCR: 40.0000 meq | EXTENDED_RELEASE_TABLET | Freq: Once | ORAL | Status: DC

## 2018-02-06 MED ORDER — FLEET ENEMA 7-19 GM/118ML RE ENEM
1.0000 | ENEMA | Freq: Once | RECTAL | Status: AC
  Administered 2018-02-06: 1 via RECTAL
  Filled 2018-02-06: qty 1

## 2018-02-06 NOTE — Progress Notes (Signed)
[facility] Progress Note  Patient: [name] KTG:[id number]   PCP: [name], NP DOB: 1960-07-03   DOA: 02/04/2018   DOS: 02/06/2018   Date of Service: the patient was seen and examined on 02/06/2018  Subjective: Passing gas, abdominal pain is still about the same.  Nausea is improving but still present.  No vomiting.  Tolerating clear liquid diet.  Brief hospital course: Pt. with PMH of DM2, gastroparesis, HTN, hypothyroidis; admitted on 02/04/2018, presented with complaint of abdominal pain, was found to have colitis. Currently further plan is continue IV Antibiotics.  Assessment and Plan: Colitis-vomiting epigastric pain. No loose stools.  WBC 12.3.  Does not meet SIRS criteria.   Still has severe pain and nausea. Dry heaves no vomiting for now  Likely element of gastroparesis contributing to pain and vomiting.  - was on IV zosyn per pharm, change to unasyn  -Bowel rest with clear liquid diet -Morphine, Phenergan,  Lipase normalx2 Appreciate GI input.  No further work-up.  Advancing to full liquid diet.  Hypokalemia- K- 3.1.  Likely secondary to colitis -Magnesium' -Replete, BMP a.m.  Borderline DM, with gastroparesis history-glucose 160.  No recent A1c on file, not on anti-hypoglycemic agents. Recheck A1c - SSi  HTN-elevated, systolic 428J to 681L -Continue home Norvasc pending med reconciliation, hold home HCTZ while hydrating resume in a.m. -PRN hydralazine  Hyperthyroidism-  Last TSH 12/17/2017-0.75. - continue home methimazole.  Prolonged QTC-501.  In the setting of hypokalemia- 3.1 Resolved   Esophageal stricture history-no swallowing problems at this time  Diet: Full iquid diet DVT Prophylaxis: subcutaneous Heparin  Advance goals of care discussion: full code  Family Communication: family was present at bedside, at the time of interview. The pt provided permission to discuss medical plan with the family. Opportunity was given to ask  question and all questions were answered satisfactorily.   Disposition:  Discharge to home.  Consultants: gastroenterology  Procedures: none  Antibiotics: Anti-infectives (From admission, onward)   Start     Dose/Rate Route Frequency Ordered Stop   02/05/18 1800  Ampicillin-Sulbactam (UNASYN) 3 g in sodium chloride 0.9 % 100 mL IVPB     3 g 200 mL/hr over 30 Minutes Intravenous Every 6 hours 02/05/18 1012     02/05/18 0300  ciprofloxacin (CIPRO) IVPB 400 mg  Status:  Discontinued     400 mg 200 mL/hr over 60 Minutes Intravenous Every 12 hours 02/04/18 1545 02/04/18 1855   02/04/18 2300  metroNIDAZOLE (FLAGYL) IVPB 500 mg  Status:  Discontinued     500 mg 100 mL/hr over 60 Minutes Intravenous Every 8 hours 02/04/18 1545 02/04/18 1855   02/04/18 2300  piperacillin-tazobactam (ZOSYN) IVPB 3.375 g  Status:  Discontinued     3.375 g 12.5 mL/hr over 240 Minutes Intravenous Every 8 hours 02/04/18 1908 02/05/18 0919   02/04/18 1315  ciprofloxacin (CIPRO) IVPB 400 mg     400 mg 200 mL/hr over 60 Minutes Intravenous  Once 02/04/18 1313 02/04/18 1437   02/04/18 1315  metroNIDAZOLE (FLAGYL) IVPB 500 mg     500 mg 100 mL/hr over 60 Minutes Intravenous  Once 02/04/18 1313 02/04/18 1541       Objective: Physical Exam: Vitals:   02/05/18 2035 02/06/18 0408 02/06/18 1409 02/06/18 1622  BP: 124/76 140/79 125/70 (!) 141/76  Pulse: (!) 51 (!) 58 (!) 56 61  Resp: 19 16 16 16   Temp: 98.5 F (36.9 C) 98 F (36.7 C) 98.6 F (37 C) 99.2 F (37.3 C)  TempSrc:   Oral Oral  SpO2: 99% 98% 100% 100%  Weight:      Height:        Intake/Output Summary (Last 24 hours) at 02/06/2018 1746 Last data filed at 02/06/2018 1137 Gross per 24 hour  Intake 1887.42 ml  Output -  Net 1887.42 ml   Filed Weights   02/04/18 1613  Weight: 98.9 kg   General: Alert, Awake and Oriented to Time, Place and Person. Appear in moderate distress, affect appropriate Eyes: PERRL, Conjunctiva normal ENT: Oral Mucosa  clear moist. Neck: no JVD, no Abnormal Mass Or lumps Cardiovascular: S1 and S2 Present, no Murmur, Peripheral Pulses Present Respiratory: normal respiratory effort, Bilateral Air entry equal and Decreased, no use of accessory muscle, Clear to Auscultation, no Crackles, no wheezes Abdomen: Bowel Sound present, Soft and moderate tenderness, no hernia Skin: no redness, no Rash, no induration Extremities: no Pedal edema, no calf tenderness Neurologic: Grossly no focal neuro deficit. Bilaterally Equal motor strength  Data Reviewed: CBC: Recent Labs  Lab 02/04/18 1110 02/05/18 0606  WBC 12.3* 7.6  NEUTROABS 9.9*  --   HGB 12.1 11.2*  HCT 37.6 36.8  MCV 72.4* 74.3*  PLT 243 865   Basic Metabolic Panel: Recent Labs  Lab 02/04/18 1110 02/05/18 0606  NA 141 141  K 3.1* 3.4*  CL 102 107  CO2 25 27  GLUCOSE 160* 98  BUN 18 11  CREATININE 1.01* 0.83  CALCIUM 9.6 8.3*  MG 1.9  --     Liver Function Tests: Recent Labs  Lab 02/04/18 1110  AST 29  ALT 31  ALKPHOS 127*  BILITOT 0.6  PROT 8.5*  ALBUMIN 4.3   Recent Labs  Lab 02/04/18 1110 02/05/18 0606  LIPASE 29 25   No results for input(s): AMMONIA in the last 168 hours. Coagulation Profile: No results for input(s): INR, PROTIME in the last 168 hours. Cardiac Enzymes: No results for input(s): CKTOTAL, CKMB, CKMBINDEX, TROPONINI in the last 168 hours. BNP (last 3 results) No results for input(s): PROBNP in the last 8760 hours. CBG: Recent Labs  Lab 02/06/18 0028 02/06/18 0407 02/06/18 0841 02/06/18 1210 02/06/18 1729  GLUCAP 91 83 73 94 93   Studies: No results found.  Scheduled Meds: . enoxaparin (LOVENOX) injection  50 mg Subcutaneous Q24H  . famotidine  20 mg Oral BID  . insulin aspart  0-9 Units Subcutaneous Q4H   Continuous Infusions: . ampicillin-sulbactam (UNASYN) IV 3 g (02/06/18 1334)  . lactated ringers 75 mL/hr at 02/06/18 0502   PRN Meds: acetaminophen **OR** acetaminophen, hydrALAZINE,  morphine injection, promethazine  Time spent: 35 minutes  Author: Berle Mull, MD [facility] Pager: [phone] 02/06/2018 5:46 PM  If 7PM-7AM, please contact night-coverage at [email or web address], password [facility]

## 2018-02-06 NOTE — Care Management Note (Signed)
Case Management Note  Patient Details  Name: [name] MRN: [id number] Date of Birth: [birth year]  Subjective/Objective:                  She is feeling better, and is tolerating clear liquids she says she has no appetite.  She continues to complain of abdominal pain.  She says this was a 10 out of 10 when it started and is now down to 7 out of 10. She has not had a bowel movement since Monday.  She does have chronic issues with constipation, says that MiraLAX does not work, she had also tried Trulance  without any benefit and usually just takes Dulcolax as needed. Action/Plan: Following for progression and dc needs  Expected Discharge Date:  (unknown)               Expected Discharge Plan:  Home/Self Care  In-House Referral:     Discharge planning Services  CM Consult  Post Acute Care Choice:    Choice offered to:     DME Arranged:    DME Agency:     HH Arranged:    [place] Agency:     Status of Service:  In process, will continue to follow  If discussed at Long Length of Stay Meetings, dates discussed:    Additional Comments:  Leeroy Cha, RN 02/06/2018, 12:17 PM

## 2018-02-06 NOTE — Consult Note (Addendum)
Consultation  Referring Provider:  [facility] Primary Care Physician:  [name], NP Primary Gastroenterologist:  Dr.Armbruster  Reason for Consultation:  Abdominal  pain/colitis  HPI: [name] is a 57 y.o. female  Known to Dr  Havery Moros, who was last seen in our office in December 2018 when she presented with complaints of rectal bleeding and constipation.  She underwent colonoscopy and EGD on 07/16/2017.  Colonoscopy was normal with the exception of a few left colon diverticuli, somewhat tortuous colon and internal hemorrhoids.  EGD with finding of a small hiatal hernia, she had a distal esophageal stricture which was dilated to 20 mm and noted to have a small duodenal polyp which was biopsied.  Path was benign with no adenomatous changes. And has history of adult onset diabetes mellitus, chronic iron deficiency anemia, prior history of gastroparesis, she status post cholecystectomy and TAH has mild coronary artery disease. Patient was admitted on 02/04/2018 when she presented to the emergency room with  acute onset of nausea vomiting and abdominal pain She says this  started on Monday evening prior to going to bed.  She had onset of abdominal pain which was mostly left-sided, then nausea and vomiting.  She had diaphoresis hot and cold spells.  Said she was up all night with nausea and eventually gets dry heaves.  She never had any diarrhea or rectal bleeding. Work-up in the emergency room a CT of the abdomen and pelvis showed a right hepatic lobe hemangioma which had increased in size up to 8.5 cm from 7.4 cm previously and there was what mild circumferential wall thickening in the descending colon consistent with an acute colitis.  Labs on admission with WBC of 12.3 and hemoglobin of 12.1, potassium 3.1 creatinine 1 Follow-up on 814 WBC down to 7.6 hemoglobin 14.2 hematocrit of 36.  Patient has been receiving morphine as needed for abdominal pain and was  started empirically on Unasyn.  She is feeling better, and is tolerating clear liquids she says she has no appetite.  She continues to complain of abdominal pain.  She says this was a 10 out of 10 when it started and is now down to 7 out of 10. She has not had a bowel movement since Monday.  She does have chronic issues with constipation, says that MiraLAX does not work, she had also tried Trulance  without any benefit and usually just takes Dulcolax as needed.  She had not started any recent new medications or had any changes to her regimen prior to acute onset of symptoms.  Past Medical History:  Diagnosis Date  . Allergy    allergic rhinitis  . Arthritis   . Atypical chest pain   . Constipation   . Cyst, ovarian   . Diabetes mellitus, type II ([place])   . Endometriosis   . Esophageal stricture   . Gastroparesis   . GERD (gastroesophageal reflux disease)   . Heart murmur   . Hyperlipidemia   . Hypertension   . 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 Iron deficiency anemia   . Migraine headache   . Non-compliance   . Peptic ulcer disease   . Thyroid disease     Past Surgical History:  Procedure Laterality Date  . ABDOMINAL EXPLORATION SURGERY     w/bso   . CARDIAC CATHETERIZATION  2009   mild non obstructive CAD  . CHOLECYSTECTOMY    . KNEE SURGERY  2005  left knee  . LEFT HEART CATH AND CORONARY ANGIOGRAPHY N/A 07/17/2017   Procedure: LEFT HEART CATH AND CORONARY ANGIOGRAPHY;  Surgeon: Martinique, Peter M, MD;  Location: [facility];  Service: Cardiovascular;  Laterality: N/A;  . TOTAL ABDOMINAL HYSTERECTOMY      Prior to Admission medications   Medication Sig Start Date End Date Taking? Authorizing Provider  amLODipine (NORVASC) 5 MG tablet TAKE 1 TABLET BY MOUTH ONCE DAILY 10/21/17  Yes [name], NP  ezetimibe (ZETIA) 10 MG tablet Take 1 tablet (10 mg total) by mouth daily. 08/30/17  Yes Fay Records,  MD  hydrochlorothiazide (HYDRODIURIL) 25 MG tablet TAKE 1 TABLET BY MOUTH ONCE DAILY 10/21/17  Yes [name], NP  methimazole (TAPAZOLE) 5 MG tablet TAKE 1 TABLET BY MOUTH THREE TIMES  A WEEK Patient taking differently: Take 5 mg by mouth 3 (three) times a week. Monday, Wednesday, Friday 12/16/17  Yes Renato Shin, MD  methocarbamol (ROBAXIN) 500 MG tablet Take 1 tablet (500 mg total) by mouth every 8 (eight) hours as needed for muscle spasms. 12/25/17  Yes [name], NP  nystatin (MYCOSTATIN/NYSTOP) powder Apply topically 2 (two) times daily. 12/17/17  Yes [name], NP  potassium chloride SA (K-DUR,KLOR-CON) 20 MEQ tablet Take 1 tablet (20 mEq total) by mouth daily. 04/19/17  Yes [name], NP  rosuvastatin (CRESTOR) 40 MG tablet Take 1 tablet (40 mg total) by mouth every other day. 10/04/17  Yes [name], NP  SUMAtriptan (IMITREX) 50 MG tablet Take 50 mg by mouth every 2 (two) hours as needed for migraine. May repeat in 2 hours if headache persists or recurs.   Yes [provider]  traMADol (ULTRAM) 50 MG tablet Take 1 tablet (50 mg total) by mouth every 8 (eight) hours as needed. 01/20/18  Yes [name], NP  traZODone (DESYREL) 50 MG tablet Take 0.5-1 tablets (25-50 mg total) by mouth at bedtime as needed for sleep. 04/19/17  Yes [name], NP  aspirin 81 MG chewable tablet Chew 1 tablet (81 mg total) by mouth daily. Patient not taking: Reported on 02/04/2018 07/19/17   Samuella Cota, MD  atorvastatin (LIPITOR) 40 MG tablet Take 1 tablet (40 mg total) by mouth daily. 08/15/15 08/26/17  [name], NP  famotidine (PEPCID) 20 MG tablet Take 1 tablet (20 mg total) by mouth 2 (two) times daily. 08/17/14 08/26/17  Delos Haring, PA-C    Current Facility-Administered Medications  Medication Dose Route Frequency Provider Last Rate Last Dose  . acetaminophen (TYLENOL) tablet 650 mg  650 mg Oral Q6H PRN Emokpae,  Ejiroghene E, MD       Or  . acetaminophen (TYLENOL) suppository 650 mg  650 mg Rectal Q6H PRN Emokpae, Ejiroghene E, MD      . Ampicillin-Sulbactam (UNASYN) 3 g in sodium chloride 0.9 % 100 mL IVPB  3 g Intravenous Q6H Bell, Michelle T, RPH 200 mL/hr at 02/06/18 0634 3 g at 02/06/18 0634  . enoxaparin (LOVENOX) injection 50 mg  50 mg Subcutaneous Q24H Emokpae, Ejiroghene E, MD   50 mg at 02/05/18 2203  . famotidine (PEPCID) tablet 20 mg  20 mg Oral BID Lavina Hamman, MD   20 mg at 02/06/18 0919  . hydrALAZINE (APRESOLINE) injection 10 mg  10 mg Intravenous Q6H PRN Emokpae, Ejiroghene E, MD      . insulin aspart (novoLOG) injection 0-9 Units  0-9 Units Subcutaneous Q4H Lavina Hamman, MD   1 Units at 02/05/18 1215  .  lactated ringers infusion   Intravenous Continuous Lavina Hamman, MD 75 mL/hr at 02/06/18 0502    . morphine 2 MG/ML injection 2 mg  2 mg Intravenous Q4H PRN Emokpae, Ejiroghene E, MD   2 mg at 02/06/18 1039  . promethazine (PHENERGAN) injection 12.5 mg  12.5 mg Intravenous Q4H PRN Leodis Sias T, RPH   12.5 mg at 02/06/18 1039    Allergies as of 02/04/2018 - Review Complete 02/04/2018  Allergen Reaction Noted  . Ace inhibitors  09/19/2009  . Celebrex [celecoxib] Other (See Comments) 05/16/2011  . Diovan [valsartan]  01/09/2013    Family History  Problem Relation Age of Onset  . Hypertension Mother   . Rheum arthritis Mother   . Hypertension Father   . Diabetes Father   . Prostate cancer Father   . Kidney disease Father   . Diabetes Sister   . Fibromyalgia Sister   . Diabetes Maternal Grandmother   . Lung cancer Maternal Grandfather   . Arthritis Brother   . Migraines Brother   . CAD Brother   . Fibromyalgia Sister   . Migraines Sister   . Colon cancer Neg Hx   . Thyroid disease Neg Hx     Social History   Socioeconomic History  . Marital status: Widowed    Spouse name: Not on file  . Number of children: 2  . Years of education: Not on file  .  Highest education level: Not on file  Occupational History  . Occupation: DISABILITY    Employer: UNEMPLOYED  Social Needs  . Financial resource strain: Not on file  . Food insecurity:    Worry: Not on file    Inability: Not on file  . Transportation needs:    Medical: Not on file    Non-medical: Not on file  Tobacco Use  . Smoking status: Former Smoker    Packs/day: 0.50    Years: 18.00    Pack years: 9.00    Types: Cigarettes    Start date: 07/29/1977    Last attempt to quit: 06/25/1994    Years since quitting: 23.6  . Smokeless tobacco: Never Used  . Tobacco comment: quit 19 years ago  Substance and Sexual Activity  . Alcohol use: Yes    Alcohol/week: 0.0 standard drinks    Comment: social drinker  . Drug use: No  . Sexual activity: Not Currently  Lifestyle  . Physical activity:    Days per week: Not on file    Minutes per session: Not on file  . Stress: Not on file  Relationships  . Social connections:    Talks on phone: Not on file    Gets together: Not on file    Attends religious service: Not on file    Active member of club or organization: Not on file    Attends meetings of clubs or organizations: Not on file    Relationship status: Not on file  . Intimate partner violence:    Fear of current or ex partner: Not on file    Emotionally abused: Not on file    Physically abused: Not on file    Forced sexual activity: Not on file  Other Topics Concern  . Not on file  Social History Narrative   Widowed   Has 2 grown children.  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   Former Smoker Quit tobacco in 1996.  She was a pack a day smoker for  approximately 10 years.   Alcohol use-yes: Social    Daily Caffeine Use:6 pack of pepsi daily     Illicit Drug Use - no    Patient does not get regular exercise.       Smoking Status:  quit    Review of Systems: Pertinent positive and negative review of systems were noted in the  above HPI section.  All other review of systems was otherwise negative.  Physical Exam: Vital signs in last 24 hours: Temp:  [98 F (36.7 C)-98.7 F (37.1 C)] 98 F (36.7 C) (08/15 0408) Pulse Rate:  [51-58] 58 (08/15 0408) Resp:  [16-19] 16 (08/15 0408) BP: (124-140)/(76-83) 140/79 (08/15 0408) SpO2:  [98 %-100 %] 98 % (08/15 0408) Last BM Date: 02/03/18 General:   Alert,  Well-developed, well-nourished,AAfemale  pleasant and cooperative in NAD Head:  Normocephalic and atraumatic. Eyes:  Sclera clear, no icterus.   Conjunctiva pink. Ears:  Normal auditory acuity. Nose:  No deformity, discharge,  or lesions. Mouth:  No deformity or lesions.   Neck:  Supple; no masses or thyromegaly. Lungs:  Clear throughout to auscultation.   No wheezes, crackles, or rhonchi. Heart:  Regular rate and rhythm; no murmurs, clicks, rubs,  or gallops. Abdomen:  Soft, obese tender left upper and left mid quadrant no guarding or rebound, BS active,nonpalp mass or hsm.   Rectal:  Deferred  Msk:  Symmetrical without gross deformities. . Pulses:  Normal pulses noted. Extremities:  Without clubbing or edema. Neurologic:  Alert and  oriented x4;  grossly normal neurologically. Skin: Skin changes on the hands consistent with a neurofibromatosis type picture Psych:  Alert and cooperative. Normal mood and affect.  Intake/Output from previous day: 08/14 0701 - 08/15 0700 In: 2817.7 [P.O.:1200; I.V.:1317.7; IV Piggyback:300] Out: -  Intake/Output this shift: No intake/output data recorded.  Lab Results: Recent Labs    02/04/18 1110 02/05/18 0606  WBC 12.3* 7.6  HGB 12.1 11.2*  HCT 37.6 36.8  PLT 243 214   BMET Recent Labs    02/04/18 1110 02/05/18 0606  NA 141 141  K 3.1* 3.4*  CL 102 107  CO2 25 27  GLUCOSE 160* 98  BUN 18 11  CREATININE 1.01* 0.83  CALCIUM 9.6 8.3*   LFT Recent Labs    02/04/18 1110  PROT 8.5*  ALBUMIN 4.3  AST 29  ALT 31  ALKPHOS 127*  BILITOT 0.6    PT/INR No results for input(s): LABPROT, INR in the last 72 hours. Hepatitis Panel No results for input(s): HEPBSAG, HCVAB, HEPAIGM, HEPBIGM in the last 72 hours.    IMPRESSION:  #65 57 year old African-American female with acute illness with left-sided abdominal pain nausea vomiting and diaphoresis onset 02/03/2018. CT imaging showed an enlarged right hepatic lobe hemangioma, and acute colitis involving the descending colon.  I think she may have had an episode of segmental ischemic colitis by her history though has not had any bleeding. Possible diverticulitis , but no obvious tics in the area of colitis on CT She is stable and actually is improving but still experiencing left-sided abdominal pain and has no appetite  #2 previously documented mild left-sided diverticulosis #3 GERD with prior esophageal stricture #4 adult onset diabetes mellitus #5 previously documented mild gastroparesis-not on any medication #6 status post cholecystectomy and TAH #7 chronic iron deficiency anemia-followed by hematology #8 large right lobe hemangioma - on CT has enlarged - asymptomatic currently  Plan; Advance to full liquid diet and then gradually advance to  soft bland diet Do not think she needs any further GI work-up at this time  Can continue a course of Abx  For 10 days- switch to Augmentin 875 BID on discharge  She is slowly improving, hopefully can be discharged in another 24 to 48 hours and may need an analgesic for a few days thereafter We will give her a fleets enema today We will follow-up in a.m., and can arrange outpatient GI follow-up prior to discharge.   Limuel Nieblas  02/06/2018, 11:39 AM

## 2018-02-07 LAB — CBC WITH DIFFERENTIAL/PLATELET
Basophils Absolute: 0 10*3/uL (ref 0.0–0.1)
Basophils Relative: 0 %
Eosinophils Absolute: 0.1 10*3/uL (ref 0.0–0.7)
Eosinophils Relative: 1 %
HCT: 37.4 % (ref 36.0–46.0)
Hemoglobin: 11.5 g/dL — ABNORMAL LOW (ref 12.0–15.0)
Lymphocytes Relative: 46 %
Lymphs Abs: 3.3 10*3/uL (ref 0.7–4.0)
MCH: 23 pg — ABNORMAL LOW (ref 26.0–34.0)
MCHC: 30.7 g/dL (ref 30.0–36.0)
MCV: 74.9 fL — ABNORMAL LOW (ref 78.0–100.0)
Monocytes Absolute: 0.5 10*3/uL (ref 0.1–1.0)
Monocytes Relative: 7 %
Neutro Abs: 3.4 10*3/uL (ref 1.7–7.7)
Neutrophils Relative %: 46 %
Platelets: 208 10*3/uL (ref 150–400)
RBC: 4.99 MIL/uL (ref 3.87–5.11)
RDW: 14.2 % (ref 11.5–15.5)
WBC: 7.3 10*3/uL (ref 4.0–10.5)

## 2018-02-07 LAB — COMPREHENSIVE METABOLIC PANEL
ALT: 20 U/L (ref 0–44)
AST: 19 U/L (ref 15–41)
Albumin: 3.4 g/dL — ABNORMAL LOW (ref 3.5–5.0)
Alkaline Phosphatase: 89 U/L (ref 38–126)
Anion gap: 8 (ref 5–15)
BUN: 6 mg/dL (ref 6–20)
CO2: 29 mmol/L (ref 22–32)
Calcium: 8.6 mg/dL — ABNORMAL LOW (ref 8.9–10.3)
Chloride: 104 mmol/L (ref 98–111)
Creatinine, Ser: 0.83 mg/dL (ref 0.44–1.00)
GFR calc Af Amer: >60 mL/min (ref >60)
GFR calc non Af Amer: >60 mL/min (ref >60)
Glucose, Bld: 101 mg/dL — ABNORMAL HIGH (ref 70–99)
Potassium: 3.7 mmol/L (ref 3.5–5.1)
Sodium: 141 mmol/L (ref 135–145)
Total Bilirubin: 0.3 mg/dL (ref 0.3–1.2)
Total Protein: 6.5 g/dL (ref 6.5–8.1)

## 2018-02-07 LAB — GLUCOSE, CAPILLARY
Glucose-Capillary: 108 mg/dL — ABNORMAL HIGH (ref 70–99)
Glucose-Capillary: 118 mg/dL — ABNORMAL HIGH (ref 70–99)
Glucose-Capillary: 88 mg/dL (ref 70–99)
Glucose-Capillary: 89 mg/dL (ref 70–99)
Glucose-Capillary: 92 mg/dL (ref 70–99)
Glucose-Capillary: 93 mg/dL (ref 70–99)
Glucose-Capillary: 96 mg/dL (ref 70–99)

## 2018-02-07 LAB — MAGNESIUM: Magnesium: 1.6 mg/dL — ABNORMAL LOW (ref 1.7–2.4)

## 2018-02-07 MED ORDER — METOCLOPRAMIDE HCL 5 MG/ML IJ SOLN
5.0000 mg | Freq: Four times a day (QID) | INTRAMUSCULAR | Status: DC
  Administered 2018-02-07 – 2018-02-08 (×4): 5 mg via INTRAVENOUS
  Filled 2018-02-07 (×4): qty 2

## 2018-02-07 MED ORDER — AMOXICILLIN-POT CLAVULANATE 875-125 MG PO TABS
1.0000 | ORAL_TABLET | Freq: Two times a day (BID) | ORAL | Status: DC
  Administered 2018-02-07 – 2018-02-08 (×3): 1 via ORAL
  Filled 2018-02-07 (×3): qty 1

## 2018-02-07 MED ORDER — BISACODYL 5 MG PO TBEC
10.0000 mg | DELAYED_RELEASE_TABLET | Freq: Once | ORAL | Status: AC
  Administered 2018-02-07: 10 mg via ORAL
  Filled 2018-02-07: qty 2

## 2018-02-07 MED ORDER — POLYETHYLENE GLYCOL 3350 17 G PO PACK
17.0000 g | PACK | Freq: Two times a day (BID) | ORAL | Status: DC
  Administered 2018-02-07: 17 g via ORAL
  Filled 2018-02-07 (×2): qty 1

## 2018-02-07 MED ORDER — SORBITOL 70 % SOLN
960.0000 mL | TOPICAL_OIL | Freq: Once | ORAL | Status: DC
  Filled 2018-02-07: qty 473

## 2018-02-07 MED ORDER — METHIMAZOLE 5 MG PO TABS
5.0000 mg | ORAL_TABLET | ORAL | Status: DC
  Administered 2018-02-07: 5 mg via ORAL
  Filled 2018-02-07: qty 1

## 2018-02-07 MED ORDER — AMLODIPINE BESYLATE 5 MG PO TABS
5.0000 mg | ORAL_TABLET | Freq: Every day | ORAL | Status: DC
  Administered 2018-02-07 – 2018-02-08 (×2): 5 mg via ORAL
  Filled 2018-02-07 (×2): qty 1

## 2018-02-07 MED ORDER — MAGNESIUM SULFATE 2 GM/50ML IV SOLN
2.0000 g | Freq: Once | INTRAVENOUS | Status: AC
  Administered 2018-02-07: 2 g via INTRAVENOUS
  Filled 2018-02-07: qty 50

## 2018-02-07 MED ORDER — HYDROCODONE-ACETAMINOPHEN 5-325 MG PO TABS
1.0000 | ORAL_TABLET | Freq: Four times a day (QID) | ORAL | Status: DC | PRN
  Administered 2018-02-07 – 2018-02-08 (×3): 1 via ORAL
  Filled 2018-02-07 (×3): qty 1

## 2018-02-07 MED ORDER — BISACODYL 10 MG RE SUPP
10.0000 mg | Freq: Once | RECTAL | Status: AC
  Administered 2018-02-07: 10 mg via RECTAL
  Filled 2018-02-07: qty 1

## 2018-02-07 NOTE — Discharge Instructions (Signed)
You have a follow up appt with [facility] - Ladesha Pacini PA-C on 9/4 at 3 pm - 3rd floor [place]  [place]

## 2018-02-07 NOTE — Progress Notes (Signed)
[facility] Progress Note  Patient: [name] WCH:[id number]   PCP: [name], NP DOB: 1960-12-21   DOA: 02/04/2018   DOS: 02/07/2018   Date of Service: the patient was seen and examined on 02/07/2018  Subjective: He needs to pass gas, continues to have abdominal pain as well as nausea.  No vomiting.  Tolerating full liquid diet.  Brief hospital course: Pt. with PMH of DM2, gastroparesis, HTN, hypothyroidis; admitted on 02/04/2018, presented with complaint of abdominal pain, was found to have colitis. Currently further plan is continue IV Reglan and monitor diet tolerance  Assessment and Plan: Colitis-vomiting epigastric pain. No loose stools.  WBC 12.3.  Does not meet SIRS criteria.   Still has severe pain and nausea. Dry heaves no vomiting for now  Likely element of gastroparesis contributing to pain and vomiting.  - was on IV zosyn per pharm, change to unasyn now on oral Augmentin -Bowel rest with clear liquid diet also to soft diet along with scheduled Reglan. -Discontinue IV morphine and change to oral Norco, Phenergan,  Lipase normalx2 Appreciate GI input.  No further work-up.  Currently signed off.  Hypokalemia Hypomagnesemia  K- 3.1 on admission Likely secondary to colitis -Magnesium replaced  -Replete, BMP a.m.  Borderline DM, with gastroparesis history-glucose 160.  No recent A1c on file, not on anti-hypoglycemic agents. Recheck A1c - SSi  HTN-elevated, systolic 353I to 144R -Continue home Norvasc pending med reconciliation, hold home HCTZ while hydrating resume in a.m. -PRN hydralazine  Hyperthyroidism-  Last TSH 12/17/2017-0.75. - continue home methimazole.  Prolonged QTC-501.  In the setting of hypokalemia- 3.1 Resolved   Esophageal stricture history-no swallowing problems at this time  Diet: Full iquid diet DVT Prophylaxis: subcutaneous Heparin  Advance goals of care discussion: full code  Family Communication: family was  present at bedside, at the time of interview. The pt provided permission to discuss medical plan with the family. Opportunity was given to ask question and all questions were answered satisfactorily.   Disposition:  Discharge to home.  Consultants: gastroenterology  Procedures: none  Antibiotics: Anti-infectives (From admission, onward)   Start     Dose/Rate Route Frequency Ordered Stop   02/07/18 1000  amoxicillin-clavulanate (AUGMENTIN) 875-125 MG per tablet 1 tablet     1 tablet Oral Every 12 hours 02/07/18 0852     02/05/18 1800  Ampicillin-Sulbactam (UNASYN) 3 g in sodium chloride 0.9 % 100 mL IVPB  Status:  Discontinued     3 g 200 mL/hr over 30 Minutes Intravenous Every 6 hours 02/05/18 1012 02/07/18 0852   02/05/18 0300  ciprofloxacin (CIPRO) IVPB 400 mg  Status:  Discontinued     400 mg 200 mL/hr over 60 Minutes Intravenous Every 12 hours 02/04/18 1545 02/04/18 1855   02/04/18 2300  metroNIDAZOLE (FLAGYL) IVPB 500 mg  Status:  Discontinued     500 mg 100 mL/hr over 60 Minutes Intravenous Every 8 hours 02/04/18 1545 02/04/18 1855   02/04/18 2300  piperacillin-tazobactam (ZOSYN) IVPB 3.375 g  Status:  Discontinued     3.375 g 12.5 mL/hr over 240 Minutes Intravenous Every 8 hours 02/04/18 1908 02/05/18 0919   02/04/18 1315  ciprofloxacin (CIPRO) IVPB 400 mg     400 mg 200 mL/hr over 60 Minutes Intravenous  Once 02/04/18 1313 02/04/18 1437   02/04/18 1315  metroNIDAZOLE (FLAGYL) IVPB 500 mg     500 mg 100 mL/hr over 60 Minutes Intravenous  Once 02/04/18 1313 02/04/18 1541  Objective: Physical Exam: Vitals:   02/06/18 1622 02/06/18 2019 02/07/18 0444 02/07/18 1415  BP: (!) 141/76 (!) 152/89 (!) 149/83 133/72  Pulse: 61 62 (!) 58 65  Resp: 16 18 18    Temp: 99.2 F (37.3 C) 98.2 F (36.8 C) 97.7 F (36.5 C) 98.2 F (36.8 C)  TempSrc: Oral Oral Oral Oral  SpO2: 100% 100% 100% 100%  Weight:      Height:        Intake/Output Summary (Last 24 hours) at 02/07/2018  1712 Last data filed at 02/07/2018 1418 Gross per 24 hour  Intake 2542.08 ml  Output -  Net 2542.08 ml   Filed Weights   02/04/18 1613  Weight: 98.9 kg   General: Alert, Awake and Oriented to Time, Place and Person. Appear in moderate distress, affect appropriate Eyes: PERRL, Conjunctiva normal ENT: Oral Mucosa clear moist. Neck: no JVD, no Abnormal Mass Or lumps Cardiovascular: S1 and S2 Present, no Murmur, Peripheral Pulses Present Respiratory: normal respiratory effort, Bilateral Air entry equal and Decreased, no use of accessory muscle, Clear to Auscultation, no Crackles, no wheezes Abdomen: Bowel Sound present, Soft and moderate tenderness, no hernia Skin: no redness, no Rash, no induration Extremities: no Pedal edema, no calf tenderness Neurologic: Grossly no focal neuro deficit. Bilaterally Equal motor strength  Data Reviewed: CBC: Recent Labs  Lab 02/04/18 1110 02/05/18 0606 02/07/18 0618  WBC 12.3* 7.6 7.3  NEUTROABS 9.9*  --  3.4  HGB 12.1 11.2* 11.5*  HCT 37.6 36.8 37.4  MCV 72.4* 74.3* 74.9*  PLT 243 214 038   Basic Metabolic Panel: Recent Labs  Lab 02/04/18 1110 02/05/18 0606 02/07/18 0618  NA 141 141 141  K 3.1* 3.4* 3.7  CL 102 107 104  CO2 25 27 29   GLUCOSE 160* 98 101*  BUN 18 11 6   CREATININE 1.01* 0.83 0.83  CALCIUM 9.6 8.3* 8.6*  MG 1.9  --  1.6*    Liver Function Tests: Recent Labs  Lab 02/04/18 1110 02/07/18 0618  AST 29 19  ALT 31 20  ALKPHOS 127* 89  BILITOT 0.6 0.3  PROT 8.5* 6.5  ALBUMIN 4.3 3.4*   Recent Labs  Lab 02/04/18 1110 02/05/18 0606  LIPASE 29 25   No results for input(s): AMMONIA in the last 168 hours. Coagulation Profile: No results for input(s): INR, PROTIME in the last 168 hours. Cardiac Enzymes: No results for input(s): CKTOTAL, CKMB, CKMBINDEX, TROPONINI in the last 168 hours. BNP (last 3 results) No results for input(s): PROBNP in the last 8760 hours. CBG: Recent Labs  Lab 02/07/18 0023  02/07/18 0447 02/07/18 0729 02/07/18 1118 02/07/18 1559  GLUCAP 118* 89 96 92 88   Studies: No results found.  Scheduled Meds: . amLODipine  5 mg Oral Daily  . amoxicillin-clavulanate  1 tablet Oral Q12H  . enoxaparin (LOVENOX) injection  50 mg Subcutaneous Q24H  . famotidine  20 mg Oral BID  . insulin aspart  0-9 Units Subcutaneous Q4H  . methimazole  5 mg Oral Q M,W,F  . metoCLOPramide (REGLAN) injection  5 mg Intravenous Q6H  . polyethylene glycol  17 g Oral BID  . sorbitol, milk of mag, mineral oil, glycerin (SMOG) enema  960 mL Rectal Once   Continuous Infusions: . lactated ringers 75 mL/hr at 02/07/18 1418   PRN Meds: acetaminophen **OR** acetaminophen, hydrALAZINE, HYDROcodone-acetaminophen, promethazine  Time spent: 35 minutes  Author: Berle Mull, MD [facility] Pager: [phone] 02/07/2018 5:12 PM  If 7PM-7AM, please contact  night-coverage at [email or web address], password [facility]

## 2018-02-07 NOTE — Progress Notes (Signed)
Patient ID: [name], female   DOB: 02/03/1961, 57 y.o.   MRN: [id number]    Progress Note   Subjective   Feels about the same - nauseated this am with breakfast , no vomiting  No BM for several days - did not retain enema   Objective   Vital signs in last 24 hours: Temp:  [97.7 F (36.5 C)-99.2 F (37.3 C)] 97.7 F (36.5 C) (08/16 0444) Pulse Rate:  [56-62] 58 (08/16 0444) Resp:  [16-18] 18 (08/16 0444) BP: (125-152)/(70-89) 149/83 (08/16 0444) SpO2:  [100 %] 100 % (08/16 0444) Last BM Date: 02/03/18 General:    AA  female in NAD Heart:  Regular rate and rhythm; no murmurs Lungs: Respirations even and unlabored, lungs CTA bilaterally Abdomen:  Soft, tender left upper quad, LMQand nondistended. Normal bowel sounds. Extremities:  Without edema. Neurologic:  Alert and oriented,  grossly normal neurologically. Psych:  Cooperative. Normal mood and affect.  Intake/Output from previous day: 08/15 0701 - 08/16 0700 In: 120 [P.O.:120] Out: -  Intake/Output this shift: No intake/output data recorded.  Lab Results: Recent Labs    02/04/18 1110 02/05/18 0606 02/07/18 0618  WBC 12.3* 7.6 7.3  HGB 12.1 11.2* 11.5*  HCT 37.6 36.8 37.4  PLT 243 214 208   BMET Recent Labs    02/04/18 1110 02/05/18 0606 02/07/18 0618  NA 141 141 141  K 3.1* 3.4* 3.7  CL 102 107 104  CO2 25 27 29   GLUCOSE 160* 98 101*  BUN 18 11 6   CREATININE 1.01* 0.83 0.83  CALCIUM 9.6 8.3* 8.6*   LFT Recent Labs    02/07/18 0618  PROT 6.5  ALBUMIN 3.4*  AST 19  ALT 20  ALKPHOS 89  BILITOT 0.3   PT/INR No results for input(s): LABPROT, INR in the last 72 hours.  Studies/Results: No results found.     Assessment / Plan:    #1 57 yo AA female with acute illness with abdominal pain,nausea/vomoting and left segmental colitis on CT   Being covered with abx for possible diverticulitis as well .  She is stable but making very slow progress Labs reassuring  Plan;  Advance to  soft bland diet as tolerates Complete 10 day course of antibiotics  will treat constipation with Miralax and dulcolax today   Discussed IV reglan with Dr Posey Pronto - perhaps gastroparesis component secondary to illness and pain meds Stop IV pain meds , transition to oral   GI will sign off - hopefully can be discharged this weekend  She has follow up appt scheduled with Amy Esterwood PA-C on 9/4 at 3pm - 3rd floor [place]               [phone]      Principal Problem:   Colitis Active Problems:   Essential hypertension, benign   ESOPHAGEAL STRICTURE   Gastroparesis   Fibromyalgia   Borderline type 2 diabetes mellitus   Hyperthyroidism     LOS: 2 days   Amy Esterwood  02/07/2018, 8:46 AM

## 2018-02-07 NOTE — Care Management Important Message (Signed)
Important Message  Patient Details  Name: [name] MRN: [id number] Date of Birth: 1961-06-06   Medicare Important Message Given:  Yes    Kerin Salen 02/07/2018, 11:04 AMImportant Message  Patient Details  Name: [name] MRN: [id number] Date of Birth: [birth year]   Medicare Important Message Given:  Yes    Kerin Salen 02/07/2018, 11:04 AM

## 2018-02-07 NOTE — Care Management Note (Signed)
Case Management Note  Patient Details  Name: NIVEDITA MIRABELLA MRN: [id number] Date of Birth: 07/01/1960  Subjective/Objective:                  Colitis/iv abx  Action/Plan: Gi has signed off/poss dc this Saturday or Sunday/ still nauseated/no cm needs present.  Expected Discharge Date:  (unknown)               Expected Discharge Plan:  Home/Self Care  In-House Referral:     Discharge planning Services  CM Consult  Post Acute Care Choice:    Choice offered to:     DME Arranged:    DME Agency:     HH Arranged:    HH Agency:     Status of Service:  In process, will continue to follow  If discussed at Long Length of Stay Meetings, dates discussed:    Additional Comments:  Leeroy Cha, RN 02/07/2018, 11:55 AM

## 2018-02-08 LAB — COMPREHENSIVE METABOLIC PANEL
ALT: 24 U/L (ref 0–44)
AST: 25 U/L (ref 15–41)
Albumin: 3.7 g/dL (ref 3.5–5.0)
Alkaline Phosphatase: 97 U/L (ref 38–126)
Anion gap: 8 (ref 5–15)
BUN: 6 mg/dL (ref 6–20)
CO2: 29 mmol/L (ref 22–32)
Calcium: 8.8 mg/dL — ABNORMAL LOW (ref 8.9–10.3)
Chloride: 104 mmol/L (ref 98–111)
Creatinine, Ser: 0.83 mg/dL (ref 0.44–1.00)
GFR calc Af Amer: >60 mL/min (ref >60)
GFR calc non Af Amer: >60 mL/min (ref >60)
Glucose, Bld: 100 mg/dL — ABNORMAL HIGH (ref 70–99)
Potassium: 3.4 mmol/L — ABNORMAL LOW (ref 3.5–5.1)
Sodium: 141 mmol/L (ref 135–145)
Total Bilirubin: 0.5 mg/dL (ref 0.3–1.2)
Total Protein: 7.1 g/dL (ref 6.5–8.1)

## 2018-02-08 LAB — GLUCOSE, CAPILLARY
Glucose-Capillary: 107 mg/dL — ABNORMAL HIGH (ref 70–99)
Glucose-Capillary: 108 mg/dL — ABNORMAL HIGH (ref 70–99)
Glucose-Capillary: 93 mg/dL (ref 70–99)
Glucose-Capillary: 95 mg/dL (ref 70–99)

## 2018-02-08 LAB — CBC WITH DIFFERENTIAL/PLATELET
Basophils Absolute: 0 10*3/uL (ref 0.0–0.1)
Basophils Relative: 0 %
Eosinophils Absolute: 0.1 10*3/uL (ref 0.0–0.7)
Eosinophils Relative: 1 %
HCT: 39.4 % (ref 36.0–46.0)
Hemoglobin: 12 g/dL (ref 12.0–15.0)
Lymphocytes Relative: 45 %
Lymphs Abs: 4.2 10*3/uL — ABNORMAL HIGH (ref 0.7–4.0)
MCH: 22.9 pg — ABNORMAL LOW (ref 26.0–34.0)
MCHC: 30.5 g/dL (ref 30.0–36.0)
MCV: 75.2 fL — ABNORMAL LOW (ref 78.0–100.0)
Monocytes Absolute: 0.5 10*3/uL (ref 0.1–1.0)
Monocytes Relative: 5 %
Neutro Abs: 4.5 10*3/uL (ref 1.7–7.7)
Neutrophils Relative %: 49 %
Platelets: 266 10*3/uL (ref 150–400)
RBC: 5.24 MIL/uL — ABNORMAL HIGH (ref 3.87–5.11)
RDW: 14.1 % (ref 11.5–15.5)
WBC: 9.3 10*3/uL (ref 4.0–10.5)

## 2018-02-08 MED ORDER — AMOXICILLIN-POT CLAVULANATE 875-125 MG PO TABS: 1.0000 | ORAL_TABLET | Freq: Two times a day (BID) | ORAL | 0 refills | Status: AC

## 2018-02-08 MED ORDER — HYDROCODONE-ACETAMINOPHEN 5-325 MG PO TABS: 1.0000 | ORAL_TABLET | Freq: Four times a day (QID) | ORAL | 0 refills | Status: AC | PRN

## 2018-02-08 MED ORDER — POLYETHYLENE GLYCOL 3350 17 G PO PACK: 17.0000 g | PACK | Freq: Two times a day (BID) | ORAL | 0 refills | Status: DC

## 2018-02-08 MED ORDER — FAMOTIDINE 20 MG PO TABS: 20.0000 mg | ORAL_TABLET | Freq: Two times a day (BID) | ORAL | 0 refills | Status: DC

## 2018-02-08 MED ORDER — METOCLOPRAMIDE HCL 5 MG PO TABS: 5.0000 mg | ORAL_TABLET | Freq: Three times a day (TID) | ORAL | 0 refills | Status: DC

## 2018-02-08 NOTE — Progress Notes (Signed)
Pt leaving this afternoon with her daughter. Alert, oriented, and without c/o. Discharge instructions/prescriptions given/explained with pt verbalizing understanding. Pt aware of followup.

## 2018-02-10 ENCOUNTER — Telehealth: Payer: Self-pay

## 2018-02-10 NOTE — Telephone Encounter (Signed)
02/10/18   Transition Care Management Follow-up Telephone Call  ADMISSION DATE: 02/04/18  DISCHARGE DATE: 02/08/18   How have you been since you were released from the hospital? Patient states she has had some weakness. But feeling much better.    Do you understand why you were in the hospital? Yes   Do you understand the discharge instrcutions? Yes     Items Reviewed:  Medications reviewed: Yes   Allergies reviewed: Yes, no new allergies   Dietary changes reviewed: Regular light   Referrals reviewed:Appointment has been scheduled  Functional Questionnaire:  Activities of Daily Living (ADLs): Patient can perform all independently.  Any patient concerns? Patient would like to know the cause of her hospitalization.  Confirmed importance and date/time of follow-up visits scheduled:Yes  Confirmed with patient if condition begins to worsen call PCP or go to the ER. Yes    Patient was given the office number and encouragred to call back with questions or concerns. Yes

## 2018-02-11 NOTE — Discharge Summary (Signed)
[facility] Discharge Summary   Patient: [name] NTZ:[id number]   PCP: [name], NP DOB: Jan 08, 1961   Date of admission: 02/04/2018   Date of discharge: 02/08/2018    Discharge Diagnoses:  Principal Problem:   Colitis Active Problems:   Essential hypertension, benign   ESOPHAGEAL STRICTURE   Gastroparesis   Fibromyalgia   Borderline type 2 diabetes mellitus   Hyperthyroidism   Admitted From: home Disposition:  Home   Recommendations for Outpatient Follow-up:  1. Please follow up with PCP in 1 week also follow up with gastroenterology   Follow-up Information    [name], NP. Schedule an appointment as soon as possible for a visit in 1 week(s).   Specialty:  Internal Medicine Contact information: [place] [address] [phone]        [facility] Follow up.   Specialty:  Gastroenterology Why:  Nicoletta Ba PA-C on 9/4 at 3pm - 3rd floor [place] Contact information: [place] [address] [phone]         Diet recommendation: soft diet  Activity: The patient is advised to gradually reintroduce usual activities.  Discharge Condition: good  Code Status: full code  History of present illness: As per the H and P dictated on admission, " [name] is a 57 y.o. female with medical history significant for DM2, gastroparesis, HTN, hypothyroidism, presented to the ED with complaints of vomiting and abdominal pain that started last night.  Multiple episodes of nonbloody emesis.  Abdominal pain is mostly upper abdomen.  Reports associated chills. Denies loose stools.  LAst bowel movement- last night normal consistency and color"  Hospital Course:  Summary of her active problems in the hospital is as following. Colitis-vomiting epigastric pain. No loose stools. Does not meet SIRS criteria. Likely element of gastroparesis contributing to  pain and vomiting. -was on IV zosyn per pharm, change to unasyn now on oral Augmentin -Bowel rest with clear liquid diet advanced to soft diet along with scheduled Reglan.tolerating well Lipase normalx2 Appreciate GI input.  No further work-up.  Currently signed off.  Hypokalemia Hypomagnesemia  repleated   BorderlineDM, withgastroparesis history-glucose 160.No recent A1c on file,not on anti-hypoglycemic agents.  HTN-elevated,systolic 030S to 923R -Continue home Norvasc pending med reconciliation,holdhome HCTZ -PRN hydralazine  Hyperthyroidism-Last TSH 12/17/2017-0.75. -continue home methimazole.  Prolonged QTC-501.In the setting of hypokalemia- 3.1 Resolved   Esophageal stricture history-no swallowing problems at this time  All other chronic medical condition were stable during the hospitalization.  Patient was ambulatory without any assistance. On the day of the discharge the patient's vitals were stable , and no other acute medical condition were reported by patient. the patient was felt safe to be discharge at home with family.  Consultants: gastroenterology  Procedures: none  DISCHARGE MEDICATION: Allergies as of 02/08/2018      Reactions   Ace Inhibitors    REACTION: chronic cough   Celebrex [celecoxib] Other (See Comments)   "makes me bleed"   Diovan [valsartan]    angioedema      Medication List    STOP taking these medications   hydrochlorothiazide 25 MG tablet Commonly known as:  HYDRODIURIL     TAKE these medications   amLODipine 5 MG tablet Commonly known as:  NORVASC TAKE 1 TABLET BY MOUTH ONCE DAILY   amoxicillin-clavulanate 875-125 MG tablet Commonly known as:  AUGMENTIN Take 1 tablet by mouth 2 (two) times daily for 7 days.  aspirin 81 MG chewable tablet Chew 1 tablet (81 mg total) by mouth daily.   ezetimibe 10 MG tablet Commonly known as:  ZETIA Take 1 tablet (10 mg total) by mouth daily.   famotidine 20 MG  tablet Commonly known as:  PEPCID Take 1 tablet (20 mg total) by mouth 2 (two) times daily.   HYDROcodone-acetaminophen 5-325 MG tablet Commonly known as:  NORCO/VICODIN Take 1 tablet by mouth every 6 (six) hours as needed for up to 5 days for severe pain (do not tkae tramadol while you are taking norco.).   methimazole 5 MG tablet Commonly known as:  TAPAZOLE TAKE 1 TABLET BY MOUTH THREE TIMES  A WEEK What changed:  See the new instructions.   methocarbamol 500 MG tablet Commonly known as:  ROBAXIN Take 1 tablet (500 mg total) by mouth every 8 (eight) hours as needed for muscle spasms.   metoCLOPramide 5 MG tablet Commonly known as:  REGLAN Take 1 tablet (5 mg total) by mouth 3 (three) times daily before meals for 6 days.   nystatin powder Commonly known as:  MYCOSTATIN/NYSTOP Apply topically 2 (two) times daily.   polyethylene glycol packet Commonly known as:  MIRALAX / GLYCOLAX Take 17 g by mouth 2 (two) times daily.   potassium chloride SA 20 MEQ tablet Commonly known as:  K-DUR,KLOR-CON Take 1 tablet (20 mEq total) by mouth daily.   rosuvastatin 40 MG tablet Commonly known as:  CRESTOR Take 1 tablet (40 mg total) by mouth every other day.   SUMAtriptan 50 MG tablet Commonly known as:  IMITREX Take 50 mg by mouth every 2 (two) hours as needed for migraine. May repeat in 2 hours if headache persists or recurs.   traMADol 50 MG tablet Commonly known as:  ULTRAM Take 1 tablet (50 mg total) by mouth every 8 (eight) hours as needed.   traZODone 50 MG tablet Commonly known as:  DESYREL Take 0.5-1 tablets (25-50 mg total) by mouth at bedtime as needed for sleep.      Allergies  Allergen Reactions  . Ace Inhibitors     REACTION: chronic cough  . Celebrex [Celecoxib] Other (See Comments)    "makes me bleed"  . Diovan [Valsartan]     angioedema   Discharge Instructions    Diet - low sodium heart healthy   Complete by:  As directed    Discharge instructions    Complete by:  As directed    It is important that you read following instructions as well as go over your medication list with RN to help you understand your care after this hospitalization.  Discharge Instructions: Please follow-up with PCP in one week  Please request your primary care physician to go over all Hospital Tests and Procedure/Radiological results at the follow up,  Please get all Hospital records sent to your PCP by signing hospital release before you go home.   Do not take more than prescribed Pain, Sleep and Anxiety Medications. You were cared for by a hospitalist during your hospital stay. If you have any questions about your discharge medications or the care you received while you were in the hospital after you are discharged, you can call the unit and ask to speak with the hospitalist on call if the hospitalist that took care of you is not available.  Once you are discharged, your primary care physician will handle any further medical issues. Please note that NO REFILLS for any discharge medications will be authorized once you  are discharged, as it is imperative that you return to your primary care physician (or establish a relationship with a primary care physician if you do not have one) for your aftercare needs so that they can reassess your need for medications and monitor your lab values. You Must read complete instructions/literature along with all the possible adverse reactions/side effects for all the Medicines you take and that have been prescribed to you. Take any new Medicines after you have completely understood and accept all the possible adverse reactions/side effects. Wear Seat belts while driving. If you have smoked or chewed Tobacco in the last 2 yrs please stop smoking and/or stop any Recreational drug use.   Increase activity slowly   Complete by:  As directed      Discharge Exam: Filed Weights   02/04/18 1613  Weight: 98.9 kg   Vitals:   02/08/18 0505  02/08/18 1424  BP: 138/77 (!) 153/90  Pulse: 66 71  Resp: 16 18  Temp: 98 F (36.7 C) 97.8 F (36.6 C)  SpO2: 99% 99%   General: Appear in no distress, no Rash; Oral Mucosa moist. Cardiovascular: S1 and S2 Present, no Murmur, no JVD Respiratory: Bilateral Air entry present and Clear to Auscultation, no Crackles, no wheezes Abdomen: Bowel Sound present, Soft and mild tenderness Extremities: no Pedal edema, no calf tenderness Neurology: Grossly no focal neuro deficit.  The results of significant diagnostics from this hospitalization (including imaging, microbiology, ancillary and laboratory) are listed below for reference.    Significant Diagnostic Studies: Ct Abdomen Pelvis W Contrast  Result Date: 02/04/2018 CLINICAL DATA:  Diffuse abdominal pain for the past 2 days. EXAM: CT ABDOMEN AND PELVIS WITH CONTRAST TECHNIQUE: Multidetector CT imaging of the abdomen and pelvis was performed using the standard protocol following bolus administration of intravenous contrast. CONTRAST:  155mL ISOVUE-300 IOPAMIDOL (ISOVUE-300) INJECTION 61% COMPARISON:  CT abdomen pelvis dated October 05, 2011. FINDINGS: Lower chest: No acute abnormality. Slightly patulous distal esophagus. Hepatobiliary: Interval enlargement of the right hepatic lobe hemangioma, now measuring up to 8.5 cm, previously 7.4 cm. No new focal liver abnormality. Status post cholecystectomy. No biliary dilatation. Pancreas: Unremarkable. No pancreatic ductal dilatation or surrounding inflammatory changes. Spleen: Normal in size without focal abnormality. Adrenals/Urinary Tract: Adrenal glands are unremarkable. Kidneys are normal, without renal calculi, focal lesion, or hydronephrosis. Bladder is unremarkable. Stomach/Bowel: Small hiatal hernia. The stomach is otherwise within normal limits. There is mild circumferential wall thickening of the descending colon. The remaining colon and small bowel are unremarkable. No obstruction. Normal appendix.  Vascular/Lymphatic: Aortic atherosclerosis. No enlarged abdominal or pelvic lymph nodes. Reproductive: Status post hysterectomy. No adnexal masses. Other: No abdominal wall hernia or abnormality. No abdominopelvic ascites. No pneumoperitoneum. Musculoskeletal: 1.9 cm area of fat necrosis in the right breast. No acute or significant osseous finding. IMPRESSION: 1. Mild circumferential wall thickening of the descending colon, consistent with colitis. 2. Mild interval enlargement of the right hepatic lobe hemangioma. 3.  Aortic atherosclerosis (ICD10-I70.0). Electronically Signed   By: Titus Dubin M.D.   On: 02/04/2018 12:48   Dg Chest Port 1 View  Result Date: 02/04/2018 CLINICAL DATA:  Vomiting. EXAM: PORTABLE CHEST 1 VIEW COMPARISON:  Chest x-ray dated 07/16/2017 FINDINGS: The heart size and mediastinal contours are within normal limits. Both lungs are clear. The visualized skeletal structures are unremarkable. No visible free air under the diaphragm. IMPRESSION: Normal exam. Electronically Signed   By: Lorriane Shire M.D.   On: 02/04/2018 11:53    Microbiology:  No results found for this or any previous visit (from the past 240 hour(s)).   Labs: CBC: Recent Labs  Lab 02/05/18 0606 02/07/18 0618 02/08/18 0531  WBC 7.6 7.3 9.3  NEUTROABS  --  3.4 4.5  HGB 11.2* 11.5* 12.0  HCT 36.8 37.4 39.4  MCV 74.3* 74.9* 75.2*  PLT 214 208 314   Basic Metabolic Panel: Recent Labs  Lab 02/05/18 0606 02/07/18 0618 02/08/18 0531  NA 141 141 141  K 3.4* 3.7 3.4*  CL 107 104 104  CO2 27 29 29   GLUCOSE 98 101* 100*  BUN 11 6 6   CREATININE 0.83 0.83 0.83  CALCIUM 8.3* 8.6* 8.8*  MG  --  1.6*  --    Liver Function Tests: Recent Labs  Lab 02/07/18 0618 02/08/18 0531  AST 19 25  ALT 20 24  ALKPHOS 89 97  BILITOT 0.3 0.5  PROT 6.5 7.1  ALBUMIN 3.4* 3.7   Recent Labs  Lab 02/05/18 0606  LIPASE 25   No results for input(s): AMMONIA in the last 168 hours. Cardiac Enzymes: No results  for input(s): CKTOTAL, CKMB, CKMBINDEX, TROPONINI in the last 168 hours. BNP (last 3 results) No results for input(s): BNP in the last 8760 hours. CBG: Recent Labs  Lab 02/07/18 2105 02/08/18 0006 02/08/18 0507 02/08/18 0741 02/08/18 1158  GLUCAP 108* 95 108* 93 107*   Time spent: 35 minutes  Signed:  Berle Mull  [facility] 02/08/2018 , 6:33 PM

## 2018-02-17 ENCOUNTER — Telehealth: Payer: Self-pay | Admitting: Family

## 2018-02-17 MED ORDER — FLUCONAZOLE 150 MG PO TABS: ORAL_TABLET | ORAL | 0 refills | Status: DC

## 2018-02-17 NOTE — Telephone Encounter (Signed)
Notified pt. 

## 2018-02-17 NOTE — Telephone Encounter (Signed)
Pt. Requesting medication for yeast infection. Routed to [name], NP to advise.

## 2018-02-17 NOTE — Telephone Encounter (Signed)
Copied from [place] [id number]. Topic: Quick Communication - See Telephone Encounter >> Feb 17, 2018 12:02 PM [name] wrote: CRM for notification. See Telephone encounter for: 02/17/18.  Patient said she has yeast infection symptoms from the antibiotic's she was taking while in the hospital. She is wondering if [name] will call her something in. She is scheduled to come in this Friday for her hospital f/u. Please advise.

## 2018-02-17 NOTE — Telephone Encounter (Signed)
Rx sent to [facility] for diflucan.

## 2018-02-21 ENCOUNTER — Ambulatory Visit (INDEPENDENT_AMBULATORY_CARE_PROVIDER_SITE_OTHER): Payer: Medicare PPO | Admitting: Family

## 2018-02-21 ENCOUNTER — Encounter: Payer: Self-pay | Admitting: Family

## 2018-02-21 DIAGNOSIS — I10 Essential (primary) hypertension: Secondary | ICD-10-CM | POA: Diagnosis not present

## 2018-02-21 DIAGNOSIS — K529 Noninfective gastroenteritis and colitis, unspecified: Secondary | ICD-10-CM

## 2018-02-21 DIAGNOSIS — N760 Acute vaginitis: Secondary | ICD-10-CM

## 2018-02-21 DIAGNOSIS — E876 Hypokalemia: Secondary | ICD-10-CM

## 2018-02-21 LAB — BASIC METABOLIC PANEL
BUN: 17 mg/dL (ref 6–23)
CO2: 29 mEq/L (ref 19–32)
Calcium: 8.4 mg/dL (ref 8.4–10.5)
Chloride: 104 mEq/L (ref 96–112)
Creatinine, Ser: 0.79 mg/dL (ref 0.40–1.20)
GFR: 95.65 mL/min (ref >60.00)
Glucose, Bld: 132 mg/dL — ABNORMAL HIGH (ref 70–99)
Potassium: 3.7 mEq/L (ref 3.5–5.1)
Sodium: 138 mEq/L (ref 135–145)

## 2018-02-21 LAB — MAGNESIUM: Magnesium: 1.7 mg/dL (ref 1.5–2.5)

## 2018-02-21 MED ORDER — FLUCONAZOLE 150 MG PO TABS: ORAL_TABLET | ORAL | 0 refills | Status: DC

## 2018-02-21 NOTE — Patient Instructions (Addendum)
Please complete lab work prior to leaving.  Keep your upcoming appointment with GI.   Call if symptoms worsen or if they do continue to improve.

## 2018-02-21 NOTE — Progress Notes (Signed)
Subjective:    Patient ID: [name], female    DOB: 1961/05/09, 57 y.o.   MRN: [id number]  HPI  Pt is a 57 yr old female who presents today for hospital follow up.  She was hospitalized 8/13-8/17/19.  Colitis-  Reports that she had recurrent N/V prior to admission.  Reports mild constipation. Has follow up with GI on 02/26/18.  Notes that she has seen small amounts of blood in the stool but this was occurring at time of her hospitalization as well and has not worsened.  Reports + vaginal itching. Only took 1 tablet.  Lost the second one.  Notes that vaginal itching continues.  She is not sexually active.  She denies discharge.  Itching started after antibiotics for her colitis.  Had hypokalemia/hypomagnesemia- both repleted during hospitalization.  HTN-  BP Readings from Last 3 Encounters:  02/21/18 (!) 143/70  02/08/18 (!) 153/90  01/30/18 (!) 144/73     Review of Systems    see HPI  Past Medical History:  Diagnosis Date  . Allergy    allergic rhinitis  . Arthritis   . Atypical chest pain   . Constipation   . Cyst, ovarian   . Diabetes mellitus, type II ([place])   . Endometriosis   . Esophageal stricture   . Gastroparesis   . GERD (gastroesophageal reflux disease)   . Heart murmur   . Hyperlipidemia   . Hypertension   . 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 Iron deficiency anemia   . Migraine headache   . Non-compliance   . Peptic ulcer disease   . Thyroid disease      Social History   Socioeconomic History  . Marital status: Widowed    Spouse name: Not on file  . Number of children: 2  . Years of education: Not on file  . Highest education level: Not on file  Occupational History  . Occupation: DISABILITY    Employer: UNEMPLOYED  Social Needs  . Financial resource strain: Not on file  . Food insecurity:    Worry: Not on file    Inability: Not on file  . Transportation needs:   Medical: Not on file    Non-medical: Not on file  Tobacco Use  . Smoking status: Former Smoker    Packs/day: 0.50    Years: 18.00    Pack years: 9.00    Types: Cigarettes    Start date: 07/29/1977    Last attempt to quit: 06/25/1994    Years since quitting: 23.6  . Smokeless tobacco: Never Used  . Tobacco comment: quit 19 years ago  Substance and Sexual Activity  . Alcohol use: Yes    Alcohol/week: 0.0 standard drinks    Comment: social drinker  . Drug use: No  . Sexual activity: Not Currently  Lifestyle  . Physical activity:    Days per week: Not on file    Minutes per session: Not on file  . Stress: Not on file  Relationships  . Social connections:    Talks on phone: Not on file    Gets together: Not on file    Attends religious service: Not on file    Active member of club or organization: Not on file    Attends meetings of clubs or organizations: Not on file    Relationship status: Not on file  . Intimate partner violence:    Fear of current or ex partner: Not  on file    Emotionally abused: Not on file    Physically abused: Not on file    Forced sexual activity: Not on file  Other Topics Concern  . Not on file  Social History Narrative   Widowed   Has 2 grown children.  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   Former Smoker Quit tobacco in 1996.  She was a pack a day smoker for approximately 10 years.   Alcohol use-yes: Social    Daily Caffeine Use:6 pack of pepsi daily     Illicit Drug Use - no    Patient does not get regular exercise.       Smoking Status:  quit    Past Surgical History:  Procedure Laterality Date  . ABDOMINAL EXPLORATION SURGERY     w/bso   . CARDIAC CATHETERIZATION  2009   mild non obstructive CAD  . CHOLECYSTECTOMY    . KNEE SURGERY  2005    left knee  . LEFT HEART CATH AND CORONARY ANGIOGRAPHY N/A 07/17/2017   Procedure: LEFT HEART CATH AND CORONARY ANGIOGRAPHY;  Surgeon: Martinique, Peter M,  MD;  Location: [facility];  Service: Cardiovascular;  Laterality: N/A;  . TOTAL ABDOMINAL HYSTERECTOMY      Family History  Problem Relation Age of Onset  . Hypertension Mother   . Rheum arthritis Mother   . Hypertension Father   . Diabetes Father   . Prostate cancer Father   . Kidney disease Father   . Diabetes Sister   . Fibromyalgia Sister   . Diabetes Maternal Grandmother   . Lung cancer Maternal Grandfather   . Arthritis Brother   . Migraines Brother   . CAD Brother   . Fibromyalgia Sister   . Migraines Sister   . Colon cancer Neg Hx   . Thyroid disease Neg Hx     Allergies  Allergen Reactions  . Ace Inhibitors     REACTION: chronic cough  . Celebrex [Celecoxib] Other (See Comments)    "makes me bleed"  . Diovan [Valsartan]     angioedema    Current Outpatient Medications on File Prior to Visit  Medication Sig Dispense Refill  . amLODipine (NORVASC) 5 MG tablet TAKE 1 TABLET BY MOUTH ONCE DAILY 90 tablet 1  . aspirin 81 MG chewable tablet Chew 1 tablet (81 mg total) by mouth daily.    Marland Kitchen ezetimibe (ZETIA) 10 MG tablet Take 1 tablet (10 mg total) by mouth daily. 90 tablet 3  . famotidine (PEPCID) 20 MG tablet Take 1 tablet (20 mg total) by mouth 2 (two) times daily. 30 tablet 0  . methimazole (TAPAZOLE) 5 MG tablet TAKE 1 TABLET BY MOUTH THREE TIMES  A WEEK (Patient taking differently: Take 5 mg by mouth 3 (three) times a week. Monday, Wednesday, Friday) 12 tablet 5  . methocarbamol (ROBAXIN) 500 MG tablet Take 1 tablet (500 mg total) by mouth every 8 (eight) hours as needed for muscle spasms. 20 tablet 0  . nystatin (MYCOSTATIN/NYSTOP) powder Apply topically 2 (two) times daily. 60 g 0  . polyethylene glycol (MIRALAX / GLYCOLAX) packet Take 17 g by mouth 2 (two) times daily. 14 each 0  . potassium chloride SA (K-DUR,KLOR-CON) 20 MEQ tablet Take 1 tablet (20 mEq total) by mouth daily. 30 tablet 5  . rosuvastatin (CRESTOR) 40 MG tablet Take 1 tablet (40 mg  total) by mouth every other day. 45 tablet 1  .  SUMAtriptan (IMITREX) 50 MG tablet Take 50 mg by mouth every 2 (two) hours as needed for migraine. May repeat in 2 hours if headache persists or recurs.    . traMADol (ULTRAM) 50 MG tablet Take 1 tablet (50 mg total) by mouth every 8 (eight) hours as needed. 30 tablet 0  . traZODone (DESYREL) 50 MG tablet Take 0.5-1 tablets (25-50 mg total) by mouth at bedtime as needed for sleep. 30 tablet 3  . metoCLOPramide (REGLAN) 5 MG tablet Take 1 tablet (5 mg total) by mouth 3 (three) times daily before meals for 6 days. 18 tablet 0  . [DISCONTINUED] atorvastatin (LIPITOR) 40 MG tablet Take 1 tablet (40 mg total) by mouth daily. 30 tablet 3   No current facility-administered medications on file prior to visit.     BP (!) 143/70 (BP Location: Right Arm, Cuff Size: Normal)   Pulse (!) 56   Temp 98.6 F (37 C) (Oral)   Resp 16   Ht 5' 4.5" (1.638 m)   Wt 225 lb (102.1 kg)   SpO2 100%   BMI 38.02 kg/m    Objective:   Physical Exam  Constitutional: She is oriented to person, place, and time. She appears well-developed and well-nourished.  Cardiovascular: Normal rate, regular rhythm and normal heart sounds.  No murmur heard. Pulmonary/Chest: Effort normal and breath sounds normal. No respiratory distress. She has no wheezes.  Abdominal: Soft. Bowel sounds are normal. She exhibits no distension. There is no tenderness. There is no guarding.  Musculoskeletal: She exhibits no edema.  Neurological: She is alert and oriented to person, place, and time.  Skin: Skin is warm and dry.  Psychiatric: She has a normal mood and affect. Her behavior is normal. Judgment and thought content normal.          Assessment & Plan:  Colitis-clinically improving.  Patient is advised to keep her upcoming appointment with gastroenterology.  She is advised to go to the ER if she develops large amounts of bright red blood per rectum.  Patient verbalizes  understanding.  Vaginitis-declines pelvic exam today.  Will refill Diflucan.  She is advised to follow-up if symptoms worsen or fail to improve with Diflucan.  Hypokalemia/hypomagnesemia- check follow-up lab work today  Hypertension-blood pressures acceptable.  Continue current medications.

## 2018-02-26 ENCOUNTER — Ambulatory Visit (INDEPENDENT_AMBULATORY_CARE_PROVIDER_SITE_OTHER): Payer: Medicare PPO | Admitting: Physician Assistant

## 2018-02-26 ENCOUNTER — Encounter: Payer: Self-pay | Admitting: Physician Assistant

## 2018-02-26 VITALS — BP 120/68 | HR 57 | Ht 64.0 in | Wt 223.0 lb

## 2018-02-26 DIAGNOSIS — K529 Noninfective gastroenteritis and colitis, unspecified: Secondary | ICD-10-CM | POA: Diagnosis not present

## 2018-02-26 DIAGNOSIS — R110 Nausea: Secondary | ICD-10-CM | POA: Diagnosis not present

## 2018-02-26 DIAGNOSIS — E1143 Type 2 diabetes mellitus with diabetic autonomic (poly)neuropathy: Secondary | ICD-10-CM

## 2018-02-26 DIAGNOSIS — K3184 Gastroparesis: Secondary | ICD-10-CM | POA: Diagnosis not present

## 2018-02-26 DIAGNOSIS — K219 Gastro-esophageal reflux disease without esophagitis: Secondary | ICD-10-CM | POA: Diagnosis not present

## 2018-02-26 MED ORDER — FAMOTIDINE 20 MG PO TABS: 20.0000 mg | ORAL_TABLET | Freq: Two times a day (BID) | ORAL | 11 refills | Status: DC

## 2018-02-26 MED ORDER — ONDANSETRON HCL 4 MG PO TABS: ORAL_TABLET | ORAL | 3 refills | Status: DC

## 2018-02-26 NOTE — Patient Instructions (Addendum)
We sent prescriptions to your pharmacy. [place]. 1. Zofran 4 mg. 2. Pepcid 20 mg.  Follow up with Dr. Havery Moros or Nicoletta Ba PA as needed.   Normal BMI (Body Mass Index- based on height and weight) is between 19 and 25. Your BMI today is Body mass index is 38.28 kg/m. Marland Kitchen Please consider follow up  regarding your BMI with your Primary Care Provider.

## 2018-02-27 ENCOUNTER — Encounter: Payer: Self-pay | Admitting: Physician Assistant

## 2018-02-27 NOTE — Progress Notes (Signed)
Subjective:    Patient ID: [name], female    DOB: Jan 25, 1961, 57 y.o.   MRN: [id number]  HPI [name] is a 57 year old African-American female, known to Dr. Havery Moros who is seen today in post hospital follow-up after recent hospitalization in mid August for acute abdominal pain nausea and vomiting. She was seen in consultation by GI on 02/06/2018 and She had CT on 02/04/2018 on admission which showed a 8.5 cm right hepatic lobe hemangioma, and circumferential wall thickening of the descending colon consistent with an acute colitis. She was managed conservatively, had been started on an empiric course of antibiotics by hospitalist which she has completed.  It was felt she most likely had an episode of segmental ischemic colitis however she never manifested diarrhea or bleeding though had persistent left-sided abdominal pain for several days nausea and difficulty eating. And also has prior history of documented gastroparesis, GERD, adult onset diabetes mellitus, fibromyalgia.  She had undergone colonoscopy and EGD earlier this year.  Colonoscopy showed a few diverticuli and internal hemorrhoids no polyps EGD with distal esophageal stricture which was dilated small duodenal polyp which was biopsied and benign no adenomatous change. At this point she is feeling better she says she still having some problems with nausea which she thinks is related to gastroparesis.  She has not been vomiting and is eating okay.  No complaints of heartburn or indigestion.  Her bowel movements are normal which for her is generally constipated using Dulcolax or Colace intermittently not noted any blood.  For the most part her abdominal pain has resolved. She had been restarted on Reglan briefly while she was in the hospital, she says she has run out really does not feel any different off of Reglan.  Review of Systems Pertinent positive and negative review of systems were noted in the above HPI section.  All other  review of systems was otherwise negative.  Outpatient Encounter Medications as of 02/26/2018  Medication Sig  . amLODipine (NORVASC) 5 MG tablet TAKE 1 TABLET BY MOUTH ONCE DAILY  . aspirin 81 MG chewable tablet Chew 1 tablet (81 mg total) by mouth daily.  Marland Kitchen ezetimibe (ZETIA) 10 MG tablet Take 1 tablet (10 mg total) by mouth daily.  . famotidine (PEPCID) 20 MG tablet Take 1 tablet (20 mg total) by mouth 2 (two) times daily.  . methimazole (TAPAZOLE) 5 MG tablet TAKE 1 TABLET BY MOUTH THREE TIMES  A WEEK (Patient taking differently: Take 5 mg by mouth 3 (three) times a week. Monday, Wednesday, Friday)  . methocarbamol (ROBAXIN) 500 MG tablet Take 1 tablet (500 mg total) by mouth every 8 (eight) hours as needed for muscle spasms.  Marland Kitchen nystatin (MYCOSTATIN/NYSTOP) powder Apply topically 2 (two) times daily.  . polyethylene glycol (MIRALAX / GLYCOLAX) packet Take 17 g by mouth 2 (two) times daily.  . potassium chloride SA (K-DUR,KLOR-CON) 20 MEQ tablet Take 1 tablet (20 mEq total) by mouth daily.  . rosuvastatin (CRESTOR) 40 MG tablet Take 1 tablet (40 mg total) by mouth every other day.  . SUMAtriptan (IMITREX) 50 MG tablet Take 50 mg by mouth every 2 (two) hours as needed for migraine. May repeat in 2 hours if headache persists or recurs.  . traMADol (ULTRAM) 50 MG tablet Take 1 tablet (50 mg total) by mouth every 8 (eight) hours as needed.  . traZODone (DESYREL) 50 MG tablet Take 0.5-1 tablets (25-50 mg total) by mouth at bedtime as needed for sleep.  . [DISCONTINUED] famotidine (  PEPCID) 20 MG tablet Take 1 tablet (20 mg total) by mouth 2 (two) times daily.  . metoCLOPramide (REGLAN) 5 MG tablet Take 1 tablet (5 mg total) by mouth 3 (three) times daily before meals for 6 days.  . ondansetron (ZOFRAN) 4 MG tablet Take 1 tab by mouth every 6 hours as needed for nausea.  . [DISCONTINUED] atorvastatin (LIPITOR) 40 MG tablet Take 1 tablet (40 mg total) by mouth daily.  . [DISCONTINUED] fluconazole  (DIFLUCAN) 150 MG tablet Take 1 tab by mouth today for yeast infection. May repeat in 3 days if symptoms not resolved.   No facility-administered encounter medications on file as of 02/26/2018.    Allergies  Allergen Reactions  . Ace Inhibitors     REACTION: chronic cough  . Celebrex [Celecoxib] Other (See Comments)    "makes me bleed"  . Diovan [Valsartan]     angioedema   Patient Active Problem List   Diagnosis Date Noted  . Colitis 02/04/2018  . Aortic atherosclerosis ([place]) 07/17/2017  . Chest pain 07/16/2017  . Insomnia 01/12/2017  . Hyperglycemia 10/07/2014  . HTN (hypertension) 09/18/2012  . Breast cyst 06/09/2012  . Hyperthyroidism 02/29/2012  . Unspecified constipation 04/18/2011  . Can't get food down 04/18/2011  . Ulnar neuropathy 03/30/2011  . Low back pain 10/18/2010  . Fibromyalgia 07/20/2009  . Hyperlipidemia 05/26/2009  . Vitamin D deficiency 05/23/2009  . Iron deficiency anemia due to chronic blood loss 10/25/2008  . GERD 10/25/2008  . LIVER HEMANGIOMA 01/01/2008  . ESOPHAGEAL STRICTURE 01/01/2008  . Gastroparesis 01/01/2008  . ALLERGIC RHINITIS 12/08/2007  . Borderline type 2 diabetes mellitus 10/31/2007  . Migraine headache 08/11/2007  . HIATAL HERNIA 08/11/2007  . OVARIAN CYST 08/11/2007  . Essential hypertension, benign 05/06/2007   Social History   Socioeconomic History  . Marital status: Widowed    Spouse name: Not on file  . Number of children: 2  . Years of education: Not on file  . Highest education level: Not on file  Occupational History  . Occupation: DISABILITY    Employer: UNEMPLOYED  Social Needs  . Financial resource strain: Not on file  . Food insecurity:    Worry: Not on file    Inability: Not on file  . Transportation needs:    Medical: Not on file    Non-medical: Not on file  Tobacco Use  . Smoking status: Former Smoker    Packs/day: 0.50    Years: 18.00    Pack years: 9.00    Types: Cigarettes    Start date:  07/29/1977    Last attempt to quit: 06/25/1994    Years since quitting: 23.6  . Smokeless tobacco: Never Used  . Tobacco comment: quit 19 years ago  Substance and Sexual Activity  . Alcohol use: Yes    Alcohol/week: 0.0 standard drinks    Comment: social drinker  . Drug use: No  . Sexual activity: Not Currently  Lifestyle  . Physical activity:    Days per week: Not on file    Minutes per session: Not on file  . Stress: Not on file  Relationships  . Social connections:    Talks on phone: Not on file    Gets together: Not on file    Attends religious service: Not on file    Active member of club or organization: Not on file    Attends meetings of clubs or organizations: Not on file    Relationship status: Not on file  .  Intimate partner violence:    Fear of current or ex partner: Not on file    Emotionally abused: Not on file    Physically abused: Not on file    Forced sexual activity: Not on file  Other Topics Concern  . Not on file  Social History Narrative   Widowed   Has 2 grown children.  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   Former Smoker Quit tobacco in 1996.  She was a pack a day smoker for approximately 10 years.   Alcohol use-yes: Social    Daily Caffeine Use:6 pack of pepsi daily     Illicit Drug Use - no    Patient does not get regular exercise.       Smoking Status:  quit    [name]'s family history includes Arthritis in her brother; CAD in her brother; Diabetes in her father, maternal grandmother, and sister; Fibromyalgia in her sister and sister; Hypertension in her father and mother; Kidney disease in her father; Lung cancer in her maternal grandfather; Migraines in her brother and sister; Prostate cancer in her father; Rheum arthritis in her mother.      Objective:    Vitals:   02/26/18 1450  BP: 120/68  Pulse: (!) 57    Physical Exam; well-developed older African-American female in no acute distress,  blood pressure 120/68 pulse 57, BMI 38.2.  HEENT; nontraumatic normocephalic EOMI PERRLA sclera anicteric, Buccal mucosa moist, Cardiovascular; regular rate and rhythm with S1-S2 no murmur rub or gallop, Pulmonary ;clear bilaterally, Abdomen ;soft, basically nontender there is no palpable mass or hepatosplenomegaly she has very minimal left lower quadrant tenderness no guarding or rebound Rectal; exam not done, Extrem; is no clubbing cyanosis or edema skin warm and dry, Neuro psych; alert and oriented, grossly nonfocal mood and affect appropriate       Assessment & Plan:   #94 57 year old African-American female with history of chronic GERD and previously documented gastroparesis felt to be on basis of adult onset diabetes mellitus. Patient had recent hospitalization with acute left-sided abdominal pain nausea and vomiting and CT showing an acute left-sided colitis.  Etiology is not entirely clear suspect this may give been a segmental ischemic colitis though symptoms were atypical. She is improved and symptoms have resolved other than residual nausea  She had recent colonoscopy earlier this year with no evidence for colitis, few left-sided diverticuli  #2 history of gastroparesis-her ongoing nausea may be secondary to gastroparesis, she seems to be doing fine without metoclopramide at this point is able to eat without difficulty but does have low level ongoing nausea  #3 GERD  Plan; Patient will continue Pepcid 20 mg p.o. twice daily, refill sent I have refilled Zofran 4 mg every 6 hours as needed for nausea She  prefers to stay off of metoclopramide at this point.  She will call if she has increasing gastroparesis symptoms. Patient will follow-up with Dr. Havery Moros or myself on an as-needed basis.   Amy S Esterwood PA-C 02/27/2018   Cc: [name], NP

## 2018-02-28 NOTE — Progress Notes (Signed)
Agree with assessment and plan as outlined.  

## 2018-03-12 DIAGNOSIS — H6122 Impacted cerumen, left ear: Secondary | ICD-10-CM | POA: Diagnosis not present

## 2018-03-12 DIAGNOSIS — L723 Sebaceous cyst: Secondary | ICD-10-CM | POA: Diagnosis not present

## 2018-03-17 ENCOUNTER — Emergency Department (HOSPITAL_BASED_OUTPATIENT_CLINIC_OR_DEPARTMENT_OTHER): Payer: Medicare PPO

## 2018-03-17 ENCOUNTER — Other Ambulatory Visit: Payer: Self-pay

## 2018-03-17 ENCOUNTER — Encounter (HOSPITAL_BASED_OUTPATIENT_CLINIC_OR_DEPARTMENT_OTHER): Payer: Self-pay | Admitting: *Deleted

## 2018-03-17 ENCOUNTER — Ambulatory Visit (INDEPENDENT_AMBULATORY_CARE_PROVIDER_SITE_OTHER): Payer: Medicare PPO | Admitting: Medical

## 2018-03-17 ENCOUNTER — Emergency Department (HOSPITAL_BASED_OUTPATIENT_CLINIC_OR_DEPARTMENT_OTHER)
Admission: EM | Admit: 2018-03-17 | Discharge: 2018-03-17 | Disposition: A | Discharge status: Home / Self Care | Payer: Medicare PPO | Attending: Emergency Medicine | Admitting: Emergency Medicine

## 2018-03-17 ENCOUNTER — Encounter: Payer: Self-pay | Admitting: Medical

## 2018-03-17 VITALS — BP 220/108 | HR 87 | Temp 98.3°F | Resp 16 | Ht 64.0 in | Wt 219.2 lb

## 2018-03-17 DIAGNOSIS — I10 Essential (primary) hypertension: Secondary | ICD-10-CM

## 2018-03-17 DIAGNOSIS — Z87891 Personal history of nicotine dependence: Secondary | ICD-10-CM | POA: Diagnosis not present

## 2018-03-17 DIAGNOSIS — K5909 Other constipation: Secondary | ICD-10-CM | POA: Diagnosis not present

## 2018-03-17 DIAGNOSIS — E119 Type 2 diabetes mellitus without complications: Secondary | ICD-10-CM | POA: Diagnosis not present

## 2018-03-17 DIAGNOSIS — R109 Unspecified abdominal pain: Secondary | ICD-10-CM | POA: Diagnosis not present

## 2018-03-17 DIAGNOSIS — K5900 Constipation, unspecified: Secondary | ICD-10-CM | POA: Diagnosis not present

## 2018-03-17 DIAGNOSIS — Z7982 Long term (current) use of aspirin: Secondary | ICD-10-CM | POA: Diagnosis not present

## 2018-03-17 DIAGNOSIS — Z79899 Other long term (current) drug therapy: Secondary | ICD-10-CM | POA: Insufficient documentation

## 2018-03-17 DIAGNOSIS — K529 Noninfective gastroenteritis and colitis, unspecified: Secondary | ICD-10-CM | POA: Diagnosis not present

## 2018-03-17 DIAGNOSIS — D1803 Hemangioma of intra-abdominal structures: Secondary | ICD-10-CM | POA: Diagnosis not present

## 2018-03-17 LAB — COMPREHENSIVE METABOLIC PANEL
ALT: 16 U/L (ref 0–44)
AST: 16 U/L (ref 15–41)
Albumin: 3.9 g/dL (ref 3.5–5.0)
Alkaline Phosphatase: 96 U/L (ref 38–126)
Anion gap: 8 (ref 5–15)
BUN: 13 mg/dL (ref 6–20)
CO2: 26 mmol/L (ref 22–32)
Calcium: 8.8 mg/dL — ABNORMAL LOW (ref 8.9–10.3)
Chloride: 108 mmol/L (ref 98–111)
Creatinine, Ser: 0.85 mg/dL (ref 0.44–1.00)
GFR calc Af Amer: >60 mL/min (ref >60)
GFR calc non Af Amer: >60 mL/min (ref >60)
Glucose, Bld: 112 mg/dL — ABNORMAL HIGH (ref 70–99)
Potassium: 3.8 mmol/L (ref 3.5–5.1)
Sodium: 142 mmol/L (ref 135–145)
Total Bilirubin: 0.3 mg/dL (ref 0.3–1.2)
Total Protein: 7.6 g/dL (ref 6.5–8.1)

## 2018-03-17 LAB — CBC
HCT: 38.6 % (ref 36.0–46.0)
Hemoglobin: 12 g/dL (ref 12.0–15.0)
MCH: 22.9 pg — ABNORMAL LOW (ref 26.0–34.0)
MCHC: 31.1 g/dL (ref 30.0–36.0)
MCV: 73.8 fL — ABNORMAL LOW (ref 78.0–100.0)
Platelets: 208 10*3/uL (ref 150–400)
RBC: 5.23 MIL/uL — ABNORMAL HIGH (ref 3.87–5.11)
RDW: 14.6 % (ref 11.5–15.5)
WBC: 7.5 10*3/uL (ref 4.0–10.5)

## 2018-03-17 LAB — URINALYSIS, MICROSCOPIC (REFLEX)

## 2018-03-17 LAB — URINALYSIS, ROUTINE W REFLEX MICROSCOPIC
Bilirubin Urine: NEGATIVE
Glucose, UA: NEGATIVE mg/dL
Ketones, ur: NEGATIVE mg/dL
Leukocytes, UA: NEGATIVE
Nitrite: NEGATIVE
Protein, ur: NEGATIVE mg/dL
Specific Gravity, Urine: 1.025 (ref 1.005–1.030)
pH: 6 (ref 5.0–8.0)

## 2018-03-17 MED ORDER — AMOXICILLIN-POT CLAVULANATE 875-125 MG PO TABS: 1.0000 | ORAL_TABLET | Freq: Two times a day (BID) | ORAL | 0 refills | Status: DC

## 2018-03-17 MED ORDER — HYDROMORPHONE HCL 1 MG/ML IJ SOLN
0.5000 mg | Freq: Once | INTRAMUSCULAR | Status: AC
  Administered 2018-03-17: 0.5 mg via INTRAVENOUS
  Filled 2018-03-17: qty 1

## 2018-03-17 MED ORDER — ONDANSETRON HCL 4 MG/2ML IJ SOLN
4.0000 mg | Freq: Once | INTRAMUSCULAR | Status: AC
  Administered 2018-03-17: 4 mg via INTRAVENOUS
  Filled 2018-03-17: qty 2

## 2018-03-17 MED ORDER — IOPAMIDOL (ISOVUE-300) INJECTION 61%
30.0000 mL | Freq: Once | INTRAVENOUS | Status: AC | PRN
  Administered 2018-03-17: 30 mL via ORAL

## 2018-03-17 MED ORDER — IOPAMIDOL (ISOVUE-300) INJECTION 61%
100.0000 mL | Freq: Once | INTRAVENOUS | Status: AC | PRN
  Administered 2018-03-17: 100 mL via INTRAVENOUS

## 2018-03-17 MED ORDER — SODIUM CHLORIDE 0.9 % IV BOLUS
500.0000 mL | Freq: Once | INTRAVENOUS | Status: AC
  Administered 2018-03-17: 500 mL via INTRAVENOUS

## 2018-03-17 MED ORDER — HYDROMORPHONE HCL 1 MG/ML IJ SOLN
1.0000 mg | Freq: Once | INTRAMUSCULAR | Status: AC
  Administered 2018-03-17: 1 mg via INTRAVENOUS
  Filled 2018-03-17: qty 1

## 2018-03-17 MED ORDER — PIPERACILLIN-TAZOBACTAM 3.375 G IVPB 30 MIN
3.3750 g | Freq: Once | INTRAVENOUS | Status: AC
  Administered 2018-03-17: 3.375 g via INTRAVENOUS
  Filled 2018-03-17 (×2): qty 50

## 2018-03-17 MED FILL — AMOX-CLAV 875-125 MG TABLET: 875-125 | 10 days supply | Qty: 20 | Fill #0

## 2018-03-17 NOTE — Discharge Instructions (Addendum)
It was our pleasure to provide your ER care today - we hope that you feel better.  Take augmentin (antibiotic) as prescribed.  Take acetaminophen as need for pain.   For constipation - drink plenty of fluids, get adequate fiber in diet, take colace (stool softener) and miralax (laxative) as need.   Follow up closely with your doctor/gi doctor in the next 2-3 days for recheck - call office to arrange appointment.   Return to ER if worse, new symptoms, high fevers, worsening or severe pain, persistent vomiting, other concern.   You were given pain medication - no driving for the next 6 hours.

## 2018-03-17 NOTE — ED Provider Notes (Signed)
[facility] EMERGENCY DEPARTMENT Provider Note   CSN: [id number] Arrival date & time: 03/17/18  1113     History   Chief Complaint Chief Complaint  Patient presents with  . Abdominal Pain    HPI [name] is a 57 y.o. female.  Patient c/o left abd pain in the past day. Pain constant, dull, moderate, non radiating. Pt notes admission last month with colitis, unsure if same type of pain. Nausea. No vomiting. States for 'long time' mild constipation - last bm 2 days ago.  No abd distension. Denies fever or chills. No dysuria. No chest pain or discomfort.   The history is provided by the patient.  Abdominal Pain   Associated symptoms include constipation. Pertinent negatives include fever, diarrhea, vomiting, dysuria and headaches.    Past Medical History:  Diagnosis Date  . Allergy    allergic rhinitis  . Arthritis   . Atypical chest pain   . Constipation   . Cyst, ovarian   . Diabetes mellitus, type II ([place])   . Endometriosis   . Esophageal stricture   . Gastroparesis   . GERD (gastroesophageal reflux disease)   . Heart murmur   . Hyperlipidemia   . Hypertension   . 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 Iron deficiency anemia   . Migraine headache   . Non-compliance   . Peptic ulcer disease   . Thyroid disease     Patient Active Problem List   Diagnosis Date Noted  . Colitis 02/04/2018  . Aortic atherosclerosis ([place]) 07/17/2017  . Chest pain 07/16/2017  . Insomnia 01/12/2017  . Hyperglycemia 10/07/2014  . HTN (hypertension) 09/18/2012  . Breast cyst 06/09/2012  . Hyperthyroidism 02/29/2012  . Unspecified constipation 04/18/2011  . Can't get food down 04/18/2011  . Ulnar neuropathy 03/30/2011  . Low back pain 10/18/2010  . Fibromyalgia 07/20/2009  . Hyperlipidemia 05/26/2009  . Vitamin D deficiency 05/23/2009  . Iron deficiency anemia due to chronic blood loss 10/25/2008  . GERD  10/25/2008  . LIVER HEMANGIOMA 01/01/2008  . ESOPHAGEAL STRICTURE 01/01/2008  . Gastroparesis 01/01/2008  . ALLERGIC RHINITIS 12/08/2007  . Borderline type 2 diabetes mellitus 10/31/2007  . Migraine headache 08/11/2007  . HIATAL HERNIA 08/11/2007  . OVARIAN CYST 08/11/2007  . Essential hypertension, benign 05/06/2007    Past Surgical History:  Procedure Laterality Date  . ABDOMINAL EXPLORATION SURGERY     w/bso   . CARDIAC CATHETERIZATION  2009   mild non obstructive CAD  . CHOLECYSTECTOMY    . KNEE SURGERY  2005    left knee  . LEFT HEART CATH AND CORONARY ANGIOGRAPHY N/A 07/17/2017   Procedure: LEFT HEART CATH AND CORONARY ANGIOGRAPHY;  Surgeon: Martinique, Peter M, MD;  Location: [facility];  Service: Cardiovascular;  Laterality: N/A;  . TOTAL ABDOMINAL HYSTERECTOMY       OB History   None      Home Medications    Prior to Admission medications   Medication Sig Start Date End Date Taking? Authorizing Provider  amLODipine (NORVASC) 5 MG tablet TAKE 1 TABLET BY MOUTH ONCE DAILY 10/21/17   [name], NP  aspirin 81 MG chewable tablet Chew 1 tablet (81 mg total) by mouth daily. 07/19/17   Samuella Cota, MD  ezetimibe (ZETIA) 10 MG tablet Take 1 tablet (10 mg total) by mouth daily. 08/30/17   Fay Records, MD  famotidine (PEPCID) 20 MG tablet Take  1 tablet (20 mg total) by mouth 2 (two) times daily. 02/26/18   Esterwood, Amy S, PA-C  methimazole (TAPAZOLE) 5 MG tablet TAKE 1 TABLET BY MOUTH THREE TIMES  A WEEK Patient taking differently: Take 5 mg by mouth 3 (three) times a week. Monday, Wednesday, Friday 12/16/17   Renato Shin, MD  methocarbamol (ROBAXIN) 500 MG tablet Take 1 tablet (500 mg total) by mouth every 8 (eight) hours as needed for muscle spasms. 12/25/17   [name], NP  metoCLOPramide (REGLAN) 5 MG tablet Take 1 tablet (5 mg total) by mouth 3 (three) times daily before meals for 6 days. 02/08/18 02/14/18  Lavina Hamman, MD  nystatin  (MYCOSTATIN/NYSTOP) powder Apply topically 2 (two) times daily. 12/17/17   [name], NP  ondansetron (ZOFRAN) 4 MG tablet Take 1 tab by mouth every 6 hours as needed for nausea. 02/26/18   Esterwood, Amy S, PA-C  polyethylene glycol (MIRALAX / GLYCOLAX) packet Take 17 g by mouth 2 (two) times daily. 02/08/18   Lavina Hamman, MD  potassium chloride SA (K-DUR,KLOR-CON) 20 MEQ tablet Take 1 tablet (20 mEq total) by mouth daily. 04/19/17   [name], NP  rosuvastatin (CRESTOR) 40 MG tablet Take 1 tablet (40 mg total) by mouth every other day. 10/04/17   [name], NP  SUMAtriptan (IMITREX) 50 MG tablet Take 50 mg by mouth every 2 (two) hours as needed for migraine. May repeat in 2 hours if headache persists or recurs.    [provider]  traMADol (ULTRAM) 50 MG tablet Take 1 tablet (50 mg total) by mouth every 8 (eight) hours as needed. 01/20/18   [name], NP  traZODone (DESYREL) 50 MG tablet Take 0.5-1 tablets (25-50 mg total) by mouth at bedtime as needed for sleep. 04/19/17   [name], NP  atorvastatin (LIPITOR) 40 MG tablet Take 1 tablet (40 mg total) by mouth daily. 08/15/15 08/26/17  [name], NP    Family History Family History  Problem Relation Age of Onset  . Hypertension Mother   . Rheum arthritis Mother   . Hypertension Father   . Diabetes Father   . Prostate cancer Father   . Kidney disease Father   . Diabetes Sister   . Fibromyalgia Sister   . Diabetes Maternal Grandmother   . Lung cancer Maternal Grandfather   . Arthritis Brother   . Migraines Brother   . CAD Brother   . Fibromyalgia Sister   . Migraines Sister   . Colon cancer Neg Hx   . Thyroid disease Neg Hx     Social History Social History   Tobacco Use  . Smoking status: Former Smoker    Packs/day: 0.50    Years: 18.00    Pack years: 9.00    Types: Cigarettes    Start date: 07/29/1977    Last attempt to quit: 06/25/1994    Years since quitting:  23.7  . Smokeless tobacco: Never Used  . Tobacco comment: quit 19 years ago  Substance Use Topics  . Alcohol use: Yes    Alcohol/week: 0.0 standard drinks    Comment: social drinker  . Drug use: No     Allergies   Ace inhibitors; Celebrex [celecoxib]; and Diovan [valsartan]   Review of Systems Review of Systems  Constitutional: Negative for fever.  HENT: Negative for sore throat.   Eyes: Negative for redness.  Respiratory: Negative for cough and shortness of breath.   Cardiovascular: Negative for chest pain.  Gastrointestinal:  Positive for abdominal pain and constipation. Negative for diarrhea and vomiting.  Endocrine: Negative for polyuria.  Genitourinary: Negative for dysuria and flank pain.  Musculoskeletal: Negative for back pain.  Skin: Negative for rash.  Neurological: Negative for headaches.  Hematological: Does not bruise/bleed easily.  Psychiatric/Behavioral: Negative for confusion.     Physical Exam Updated Vital Signs Ht 1.626 m (5\' 4" )   Wt 99.4 kg   BMI 37.61 kg/m   Physical Exam  Constitutional: She appears well-developed and well-nourished.  HENT:  Mouth/Throat: Oropharynx is clear and moist.  Eyes: Conjunctivae are normal. No scleral icterus.  Neck: Neck supple. No tracheal deviation present.  Cardiovascular: Normal rate, regular rhythm, normal heart sounds and intact distal pulses. Exam reveals no gallop and no friction rub.  No murmur heard. Pulmonary/Chest: Effort normal and breath sounds normal. No respiratory distress.  Abdominal: Soft. Normal appearance and bowel sounds are normal. She exhibits no distension and no mass. There is tenderness. There is no rebound and no guarding. No hernia.  Left abd tenderness.   Genitourinary:  Genitourinary Comments: No cva tenderness.  Musculoskeletal: She exhibits no edema.  Neurological: She is alert.  Skin: Skin is warm and dry. No rash noted.  No shingles/rash to area of pain.   Psychiatric: She has  a normal mood and affect.  Nursing note and vitals reviewed.    ED Treatments / Results  Labs (all labs ordered are listed, but only abnormal results are displayed) Results for orders placed or performed during the hospital encounter of 03/17/18  CBC  Result Value Ref Range   WBC 7.5 4.0 - 10.5 K/uL   RBC 5.23 (H) 3.87 - 5.11 MIL/uL   Hemoglobin 12.0 12.0 - 15.0 g/dL   HCT 38.6 36.0 - 46.0 %   MCV 73.8 (L) 78.0 - 100.0 fL   MCH 22.9 (L) 26.0 - 34.0 pg   MCHC 31.1 30.0 - 36.0 g/dL   RDW 14.6 11.5 - 15.5 %   Platelets 208 150 - 400 K/uL  Comprehensive metabolic panel  Result Value Ref Range   Sodium 142 135 - 145 mmol/L   Potassium 3.8 3.5 - 5.1 mmol/L   Chloride 108 98 - 111 mmol/L   CO2 26 22 - 32 mmol/L   Glucose, Bld 112 (H) 70 - 99 mg/dL   BUN 13 6 - 20 mg/dL   Creatinine, Ser 0.85 0.44 - 1.00 mg/dL   Calcium 8.8 (L) 8.9 - 10.3 mg/dL   Total Protein 7.6 6.5 - 8.1 g/dL   Albumin 3.9 3.5 - 5.0 g/dL   AST 16 15 - 41 U/L   ALT 16 0 - 44 U/L   Alkaline Phosphatase 96 38 - 126 U/L   Total Bilirubin 0.3 0.3 - 1.2 mg/dL   GFR calc non Af Amer >60 >60 mL/min   GFR calc Af Amer >60 >60 mL/min   Anion gap 8 5 - 15  Urinalysis, Routine w reflex microscopic  Result Value Ref Range   Color, Urine YELLOW YELLOW   APPearance CLEAR CLEAR   Specific Gravity, Urine 1.025 1.005 - 1.030   pH 6.0 5.0 - 8.0   Glucose, UA NEGATIVE NEGATIVE mg/dL   Hgb urine dipstick TRACE (A) NEGATIVE   Bilirubin Urine NEGATIVE NEGATIVE   Ketones, ur NEGATIVE NEGATIVE mg/dL   Protein, ur NEGATIVE NEGATIVE mg/dL   Nitrite NEGATIVE NEGATIVE   Leukocytes, UA NEGATIVE NEGATIVE  Urinalysis, Microscopic (reflex)  Result Value Ref Range   RBC / HPF  0-5 0 - 5 RBC/hpf   WBC, UA 0-5 0 - 5 WBC/hpf   Bacteria, UA RARE (A) NONE SEEN   Squamous Epithelial / LPF 0-5 0 - 5   Mucus PRESENT     EKG None  Radiology Ct Abdomen Pelvis W Contrast  Result Date: 03/17/2018 CLINICAL DATA:  LEFT abdominal pain  beginning today. History of cholecystectomy, hysterectomy, ovarian cyst. EXAM: CT ABDOMEN AND PELVIS WITH CONTRAST TECHNIQUE: Multidetector CT imaging of the abdomen and pelvis was performed using the standard protocol following bolus administration of intravenous contrast. CONTRAST:  18mL ISOVUE-300 IOPAMIDOL (ISOVUE-300) INJECTION 61%, 55mL ISOVUE-300 IOPAMIDOL (ISOVUE-300) INJECTION 61% COMPARISON:  CT abdomen and pelvis February 04, 2018 FINDINGS: LOWER CHEST: Dependent atelectasis. Included heart size is upper limits of normal. Mild coronary artery calcifications. No pericardial effusion. Patulous esophagus with air-fluid level. HEPATOBILIARY: Stable 8.6 x 7 cm hemangioma RIGHT lobe of the liver with contour expansion, centripetal puddling and partial filling in on delayed phase. Status post cholecystectomy. PANCREAS: Normal. SPLEEN: Normal. ADRENALS/URINARY TRACT: Kidneys are orthotopic, demonstrating symmetric enhancement. No nephrolithiasis, hydronephrosis or solid renal masses. The unopacified ureters are normal in course and caliber. Delayed imaging through the kidneys demonstrates symmetric prompt contrast excretion within the proximal urinary collecting system. Urinary bladder is partially distended, mild fat stranding about the LEFT aspect of the bladder contiguous with colonic fat stranding. Normal adrenal glands. STOMACH/BOWEL: The stomach, small and large bowel are normal in course and caliber without inflammatory changes. Moderate amount of retained large bowel stool. Faint fat stranding about the descending colon. Enteric contrast has not yet reached the distal small bowel. Normal appendix. VASCULAR/LYMPHATIC: Aortoiliac vessels are normal in course and caliber. Mild calcific atherosclerosis. No lymphadenopathy by CT size criteria. REPRODUCTIVE: Status post hysterectomy. OTHER: No intraperitoneal free fluid or free air. MUSCULOSKELETAL: Nonacute.  Anterior pelvic wall scarring. IMPRESSION: 1. Fat  stranding LEFT lower quadrant, possible cystitis or mild colitis. 2. Moderate amount of retained large bowel stool, no bowel obstruction. 3. Large hepatic hemangioma. 4. Patulous esophagus with air-fluid level, aspiration risk. Aortic Atherosclerosis (ICD10-I70.0). Electronically Signed   By: Elon Alas M.D.   On: 03/17/2018 14:29    Procedures Procedures (including critical care time)  Medications Ordered in ED Medications  sodium chloride 0.9 % bolus 500 mL (has no administration in time range)     Initial Impression / Assessment and Plan / ED Course  I have reviewed the triage vital signs and the nursing notes.  Pertinent labs & imaging results that were available during my care of the patient were reviewed by me and considered in my medical decision making (see chart for details).  Iv ns bolus. Labs.   Dilaudid 1 mg iv. zofran iv.   Reviewed nursing notes and prior charts for additional history.   Labs reviewed - chem normal.  Ct reviewed  - ? Colitis. Hx same. No diarrhea, pt states if anything has persistence of her chronic constipation.  Recheck abd soft mild left tenderness. No peritoneal signs.   Pain is improved. Nausea improved.   Zosyn iv.   Pt currently appears stable for d/c. Will rx abx for colitis. For constipation, rec colace/miralax prn.   Pt states sees [facility] - rec close f/u with them.  Return precautions provided.     Final Clinical Impressions(s) / ED Diagnoses   Final diagnoses:  None    ED Discharge Orders    None       Lajean Saver, MD 03/17/18 1452

## 2018-03-17 NOTE — Progress Notes (Signed)
Subjective:    Patient ID: [name], female    DOB: 1961/06/23, 57 y.o.   MRN: [id number]  HPI  Pt in with some severe abdomen pain. Patient points to left upper quadrant and lower abdomen. Pt abdomen pain level is 8-9/10 pain. Pt state last night and yesterday she felt fine but when around 5 am while sleeping started to feel the pain. No pain appears severe and she is crying due to severe level of pain.  Pt feels nausea but can't vomit. Pt did not have appetite this morning.   No blood in patient urine.  No back pain reported. No fever, no chills or sweats noted.  On review prior colonoscopy show divertuli. Also prior CT a mont ago showed.  IMPRESSION: 1. Mild circumferential wall thickening of the descending colon, consistent with colitis. 2. Mild interval enlargement of the right hepatic lobe hemangioma. 3.  Aortic atherosclerosis (ICD10-I70.0).   Review of Systems  Constitutional: Negative for chills, fatigue and fever.  Respiratory: Negative for cough, chest tightness and wheezing.   Cardiovascular: Negative for chest pain and palpitations.  Gastrointestinal: Positive for abdominal pain and nausea. Negative for blood in stool, constipation, diarrhea and vomiting.  Genitourinary: Negative for difficulty urinating and frequency.  Musculoskeletal: Negative for back pain.  Skin: Negative for rash.  Neurological: Negative for dizziness and headaches.  Hematological: Negative for adenopathy. Does not bruise/bleed easily.  Psychiatric/Behavioral: Negative for behavioral problems and confusion.   Past Medical History:  Diagnosis Date  . Allergy    allergic rhinitis  . Arthritis   . Atypical chest pain   . Constipation   . Cyst, ovarian   . Diabetes mellitus, type II ([place])   . Endometriosis   . Esophageal stricture   . Gastroparesis   . GERD (gastroesophageal reflux disease)   . Heart murmur   . Hyperlipidemia   . Hypertension   . 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 Iron deficiency anemia   . Migraine headache   . Non-compliance   . Peptic ulcer disease   . Thyroid disease      Social History   Socioeconomic History  . Marital status: Widowed    Spouse name: Not on file  . Number of children: 2  . Years of education: Not on file  . Highest education level: Not on file  Occupational History  . Occupation: DISABILITY    Employer: UNEMPLOYED  Social Needs  . Financial resource strain: Not on file  . Food insecurity:    Worry: Not on file    Inability: Not on file  . Transportation needs:    Medical: Not on file    Non-medical: Not on file  Tobacco Use  . Smoking status: Former Smoker    Packs/day: 0.50    Years: 18.00    Pack years: 9.00    Types: Cigarettes    Start date: 07/29/1977    Last attempt to quit: 06/25/1994    Years since quitting: 23.7  . Smokeless tobacco: Never Used  . Tobacco comment: quit 19 years ago  Substance and Sexual Activity  . Alcohol use: Yes    Alcohol/week: 0.0 standard drinks    Comment: social drinker  . Drug use: No  . Sexual activity: Not Currently  Lifestyle  . Physical activity:    Days per week: Not on file    Minutes per session: Not on file  . Stress: Not on  file  Relationships  . Social connections:    Talks on phone: Not on file    Gets together: Not on file    Attends religious service: Not on file    Active member of club or organization: Not on file    Attends meetings of clubs or organizations: Not on file    Relationship status: Not on file  . Intimate partner violence:    Fear of current or ex partner: Not on file    Emotionally abused: Not on file    Physically abused: Not on file    Forced sexual activity: Not on file  Other Topics Concern  . Not on file  Social History Narrative   Widowed   Has 2 grown children.  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   Former  Smoker Quit tobacco in 1996.  She was a pack a day smoker for approximately 10 years.   Alcohol use-yes: Social    Daily Caffeine Use:6 pack of pepsi daily     Illicit Drug Use - no    Patient does not get regular exercise.       Smoking Status:  quit    Past Surgical History:  Procedure Laterality Date  . ABDOMINAL EXPLORATION SURGERY     w/bso   . CARDIAC CATHETERIZATION  2009   mild non obstructive CAD  . CHOLECYSTECTOMY    . KNEE SURGERY  2005    left knee  . LEFT HEART CATH AND CORONARY ANGIOGRAPHY N/A 07/17/2017   Procedure: LEFT HEART CATH AND CORONARY ANGIOGRAPHY;  Surgeon: Martinique, Peter M, MD;  Location: [facility];  Service: Cardiovascular;  Laterality: N/A;  . TOTAL ABDOMINAL HYSTERECTOMY      Family History  Problem Relation Age of Onset  . Hypertension Mother   . Rheum arthritis Mother   . Hypertension Father   . Diabetes Father   . Prostate cancer Father   . Kidney disease Father   . Diabetes Sister   . Fibromyalgia Sister   . Diabetes Maternal Grandmother   . Lung cancer Maternal Grandfather   . Arthritis Brother   . Migraines Brother   . CAD Brother   . Fibromyalgia Sister   . Migraines Sister   . Colon cancer Neg Hx   . Thyroid disease Neg Hx     Allergies  Allergen Reactions  . Ace Inhibitors     REACTION: chronic cough  . Celebrex [Celecoxib] Other (See Comments)    "makes me bleed"  . Diovan [Valsartan]     angioedema    Current Outpatient Medications on File Prior to Visit  Medication Sig Dispense Refill  . amLODipine (NORVASC) 5 MG tablet TAKE 1 TABLET BY MOUTH ONCE DAILY 90 tablet 1  . aspirin 81 MG chewable tablet Chew 1 tablet (81 mg total) by mouth daily.    Marland Kitchen ezetimibe (ZETIA) 10 MG tablet Take 1 tablet (10 mg total) by mouth daily. 90 tablet 3  . famotidine (PEPCID) 20 MG tablet Take 1 tablet (20 mg total) by mouth 2 (two) times daily. 60 tablet 11  . methimazole (TAPAZOLE) 5 MG tablet TAKE 1 TABLET BY MOUTH THREE TIMES  A  WEEK (Patient taking differently: Take 5 mg by mouth 3 (three) times a week. Monday, Wednesday, Friday) 12 tablet 5  . methocarbamol (ROBAXIN) 500 MG tablet Take 1 tablet (500 mg total) by mouth every 8 (eight) hours as needed for muscle spasms. [place]  tablet 0  . nystatin (MYCOSTATIN/NYSTOP) powder Apply topically 2 (two) times daily. 60 g 0  . ondansetron (ZOFRAN) 4 MG tablet Take 1 tab by mouth every 6 hours as needed for nausea. 100 tablet 3  . polyethylene glycol (MIRALAX / GLYCOLAX) packet Take 17 g by mouth 2 (two) times daily. 14 each 0  . potassium chloride SA (K-DUR,KLOR-CON) 20 MEQ tablet Take 1 tablet (20 mEq total) by mouth daily. 30 tablet 5  . rosuvastatin (CRESTOR) 40 MG tablet Take 1 tablet (40 mg total) by mouth every other day. 45 tablet 1  . SUMAtriptan (IMITREX) 50 MG tablet Take 50 mg by mouth every 2 (two) hours as needed for migraine. May repeat in 2 hours if headache persists or recurs.    . traMADol (ULTRAM) 50 MG tablet Take 1 tablet (50 mg total) by mouth every 8 (eight) hours as needed. 30 tablet 0  . traZODone (DESYREL) 50 MG tablet Take 0.5-1 tablets (25-50 mg total) by mouth at bedtime as needed for sleep. 30 tablet 3  . metoCLOPramide (REGLAN) 5 MG tablet Take 1 tablet (5 mg total) by mouth 3 (three) times daily before meals for 6 days. 18 tablet 0  . [DISCONTINUED] atorvastatin (LIPITOR) 40 MG tablet Take 1 tablet (40 mg total) by mouth daily. 30 tablet 3   No current facility-administered medications on file prior to visit.     BP (!) 220/108 Comment: 195/105 second check manual.  Pulse 87   Temp 98.3 F (36.8 C) (Oral)   Resp 16   Ht 5\' 4"  (1.626 m)   Wt 219 lb 3.2 oz (99.4 kg)   SpO2 100%   BMI 37.63 kg/m       Objective:   Physical Exam   General Mental Status- Alert. General Appearance- appears in severe pain. Crying due to pain.  Skin General: Color- Normal Color. Moisture- Normal Moisture.  Neck Carotid Arteries- Normal color. Moisture-  Normal Moisture. No carotid bruits. No JVD.  Chest and Lung Exam Auscultation: Breath Sounds:-Normal.  Cardiovascular Auscultation:Rythm- Regular. Murmurs & Other Heart Sounds:Auscultation of the heart reveals- No Murmurs.  Abdomen Inspection:-Inspeection Normal. Palpation/Percussion:Note:No mass. Palpation and Percussion of the abdomen reveal- severe left upper quadrant and left lower quadrant  Tenderness/rebound, Non Distended + BS.  Back- no cva tenderness.  Neurologic Cranial Nerve exam:- CN III-XII intact(No nystagmus). Grossly intact.       Assessment & Plan:  For your severe left-sided abdomen pain that started early today, I do think it is best for you to be evaluated at the emergency department.  You report high level 8-9 out of 10 pain and with this presentation I do think stat labs as well as stat CT of abdomen would be most appropriate course of action.  They can give you a medication to help control the pain as a work-up is done and giving medication for nausea as well.  Your blood pressure is severely elevated as well but this may be related to the severe pain you are experiencing.  I did talk with emergency department physician today and notified him of your recent presentation.  Follow-up date with [place] as needed per ED recommendation.  Mackie Pai, PA-C

## 2018-03-17 NOTE — ED Triage Notes (Signed)
Abdominal pain this am. She was seen by her MD and sent her for further testing.

## 2018-03-17 NOTE — Patient Instructions (Addendum)
For your severe left-sided abdomen pain that started early today, I do think it is best for you to be evaluated at the emergency department.  You report high level 8-9 out of 10 pain and with this presentation I do think stat labs as well as stat CT of abdomen would be most appropriate course of action.  They can give you a medication to help control the pain as a work-up is done and giving medication for nausea as well.  Your blood pressure is severely elevated as well but this may be related to the severe pain you are experiencing.  I did talk with emergency department physician today and notified him of your recent presentation.  Follow-up date with [place] as needed per ED recommendation.

## 2018-03-19 ENCOUNTER — Telehealth: Payer: Self-pay | Admitting: Physician Assistant

## 2018-03-26 ENCOUNTER — Ambulatory Visit: Payer: Medicare PPO | Admitting: Family

## 2018-03-28 ENCOUNTER — Ambulatory Visit: Payer: Medicare PPO | Admitting: Physician Assistant

## 2018-03-31 ENCOUNTER — Ambulatory Visit: Payer: Medicare PPO | Admitting: Family

## 2018-04-03 ENCOUNTER — Other Ambulatory Visit (INDEPENDENT_AMBULATORY_CARE_PROVIDER_SITE_OTHER): Payer: Medicare PPO

## 2018-04-03 ENCOUNTER — Encounter: Payer: Self-pay | Admitting: Physician Assistant

## 2018-04-03 ENCOUNTER — Ambulatory Visit (INDEPENDENT_AMBULATORY_CARE_PROVIDER_SITE_OTHER): Payer: Medicare PPO | Admitting: Physician Assistant

## 2018-04-03 VITALS — BP 150/90 | HR 67 | Ht 64.0 in | Wt 221.4 lb

## 2018-04-03 DIAGNOSIS — K5730 Diverticulosis of large intestine without perforation or abscess without bleeding: Secondary | ICD-10-CM

## 2018-04-03 DIAGNOSIS — K529 Noninfective gastroenteritis and colitis, unspecified: Secondary | ICD-10-CM

## 2018-04-03 DIAGNOSIS — R1032 Left lower quadrant pain: Secondary | ICD-10-CM | POA: Diagnosis not present

## 2018-04-03 LAB — HIGH SENSITIVITY CRP: CRP, High Sensitivity: 22.69 mg/L — ABNORMAL HIGH (ref 0.000–5.000)

## 2018-04-03 LAB — SEDIMENTATION RATE: Sed Rate: 40 mm/hr — ABNORMAL HIGH (ref 0–30)

## 2018-04-03 NOTE — Progress Notes (Signed)
Subjective:    Patient ID: [name], female    DOB: Aug 13, 1960, 57 y.o.   MRN: [id number]  HPI [name] is a pleasant 57 year old African-American female, known to Dr. Havery Moros, and myself.  She has history of GERD with prior esophageal dilation, gastroparesis, adult onset diabetes mellitus, fibromyalgia, hepatic hemangioma, and hypertension. She comes in today after another recent episode of acute fairly severe abdominal pain awakening her from sleep on 03/17/2018 the pain quickly migrated to the left abdomen and she had associated nausea and vomiting.  She did not have any diarrhea melena or hematochezia. She had CT done on 03/17/2018 which showed a stable 7.7 x 8.6 cm hepatic hemangioma, and some fat stranding in the left lower quadrant question cystitis versus colitis.  There was increased stool noted in the colon. We had seen patient during a hospitalization in August 2019 with very similar presentation.  She also had CT done during that admission which showed wall thickening of the left colon consistent with colitis.  Again she had no associated diarrhea just left-sided abdominal pain nausea and vomiting. Is felt she may have had a segmental ischemic colitis I am a unlikely infectious but she was given a course of antibiotics. She was also given a course of Cipro with this most recent episode. Over the past week or so symptoms had gradually improved but she is now having her baseline queasiness but no active vomiting and is able to eat he continues to complain of discomfort in the left lower quadrant.  Her bowel movements are normal.  She had not been started on any new medications, takes a beta-blocker for her hypertension.  Last colonoscopy and EGD were done in January 2019, at EGD she had a distal esophageal stricture which was dilated and also had a small duodenal polyp.  Colonoscopy revealed a tortuous colon with a few medium sized diverticuli in the sigmoid colon  Review of  Systems Pertinent positive and negative review of systems were noted in the above HPI section.  All other review of systems was otherwise negative.  Outpatient Encounter Medications as of 04/03/2018  Medication Sig  . amLODipine (NORVASC) 5 MG tablet TAKE 1 TABLET BY MOUTH ONCE DAILY  . aspirin 81 MG chewable tablet Chew 1 tablet (81 mg total) by mouth daily.  Marland Kitchen ezetimibe (ZETIA) 10 MG tablet Take 1 tablet (10 mg total) by mouth daily.  . famotidine (PEPCID) 20 MG tablet Take 1 tablet (20 mg total) by mouth 2 (two) times daily.  . methimazole (TAPAZOLE) 5 MG tablet TAKE 1 TABLET BY MOUTH THREE TIMES  A WEEK (Patient taking differently: Take 5 mg by mouth 3 (three) times a week. Monday, Wednesday, Friday)  . methocarbamol (ROBAXIN) 500 MG tablet Take 1 tablet (500 mg total) by mouth every 8 (eight) hours as needed for muscle spasms.  Marland Kitchen nystatin (MYCOSTATIN/NYSTOP) powder Apply topically 2 (two) times daily.  . ondansetron (ZOFRAN) 4 MG tablet Take 1 tab by mouth every 6 hours as needed for nausea.  . polyethylene glycol (MIRALAX / GLYCOLAX) packet Take 17 g by mouth 2 (two) times daily.  . potassium chloride SA (K-DUR,KLOR-CON) 20 MEQ tablet Take 1 tablet (20 mEq total) by mouth daily.  . rosuvastatin (CRESTOR) 40 MG tablet Take 1 tablet (40 mg total) by mouth every other day.  . SUMAtriptan (IMITREX) 50 MG tablet Take 50 mg by mouth every 2 (two) hours as needed for migraine. May repeat in 2 hours if headache persists or  recurs.  . traMADol (ULTRAM) 50 MG tablet Take 1 tablet (50 mg total) by mouth every 8 (eight) hours as needed.  . traZODone (DESYREL) 50 MG tablet Take 0.5-1 tablets (25-50 mg total) by mouth at bedtime as needed for sleep.  . metoCLOPramide (REGLAN) 5 MG tablet Take 1 tablet (5 mg total) by mouth 3 (three) times daily before meals for 6 days.  . [DISCONTINUED] amoxicillin-clavulanate (AUGMENTIN) 875-125 MG tablet Take 1 tablet by mouth 2 (two) times daily. (Patient not taking:  Reported on 04/03/2018)  . [DISCONTINUED] amoxicillin-clavulanate (AUGMENTIN) 875-125 MG tablet Take 1 tablet by mouth 2 (two) times daily. (Patient not taking: Reported on 04/03/2018)  . [DISCONTINUED] atorvastatin (LIPITOR) 40 MG tablet Take 1 tablet (40 mg total) by mouth daily.   No facility-administered encounter medications on file as of 04/03/2018.    Allergies  Allergen Reactions  . Ace Inhibitors     REACTION: chronic cough  . Celebrex [Celecoxib] Other (See Comments)    "makes me bleed"  . Diovan [Valsartan]     angioedema   Patient Active Problem List   Diagnosis Date Noted  . Colitis 02/04/2018  . Aortic atherosclerosis ([place]) 07/17/2017  . Chest pain 07/16/2017  . Insomnia 01/12/2017  . Hyperglycemia 10/07/2014  . HTN (hypertension) 09/18/2012  . Breast cyst 06/09/2012  . Hyperthyroidism 02/29/2012  . Unspecified constipation 04/18/2011  . Can't get food down 04/18/2011  . Ulnar neuropathy 03/30/2011  . Low back pain 10/18/2010  . Fibromyalgia 07/20/2009  . Hyperlipidemia 05/26/2009  . Vitamin D deficiency 05/23/2009  . Iron deficiency anemia due to chronic blood loss 10/25/2008  . GERD 10/25/2008  . LIVER HEMANGIOMA 01/01/2008  . ESOPHAGEAL STRICTURE 01/01/2008  . Gastroparesis 01/01/2008  . ALLERGIC RHINITIS 12/08/2007  . Borderline type 2 diabetes mellitus 10/31/2007  . Migraine headache 08/11/2007  . HIATAL HERNIA 08/11/2007  . OVARIAN CYST 08/11/2007  . Essential hypertension, benign 05/06/2007   Social History   Socioeconomic History  . Marital status: Widowed    Spouse name: Not on file  . Number of children: 2  . Years of education: Not on file  . Highest education level: Not on file  Occupational History  . Occupation: DISABILITY    Employer: UNEMPLOYED  Social Needs  . Financial resource strain: Not on file  . Food insecurity:    Worry: Not on file    Inability: Not on file  . Transportation needs:    Medical: Not on file     Non-medical: Not on file  Tobacco Use  . Smoking status: Former Smoker    Packs/day: 0.50    Years: 18.00    Pack years: 9.00    Types: Cigarettes    Start date: 07/29/1977    Last attempt to quit: 06/25/1994    Years since quitting: 23.7  . Smokeless tobacco: Never Used  . Tobacco comment: quit 19 years ago  Substance and Sexual Activity  . Alcohol use: Yes    Alcohol/week: 0.0 standard drinks    Comment: social drinker  . Drug use: No  . Sexual activity: Not Currently  Lifestyle  . Physical activity:    Days per week: Not on file    Minutes per session: Not on file  . Stress: Not on file  Relationships  . Social connections:    Talks on phone: Not on file    Gets together: Not on file    Attends religious service: Not on file    Active member of  club or organization: Not on file    Attends meetings of clubs or organizations: Not on file    Relationship status: Not on file  . Intimate partner violence:    Fear of current or ex partner: Not on file    Emotionally abused: Not on file    Physically abused: Not on file    Forced sexual activity: Not on file  Other Topics Concern  . Not on file  Social History Narrative   Widowed   Has 2 grown children.  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   Former Smoker Quit tobacco in 1996.  She was a pack a day smoker for approximately 10 years.   Alcohol use-yes: Social    Daily Caffeine Use:6 pack of pepsi daily     Illicit Drug Use - no    Patient does not get regular exercise.       Smoking Status:  quit    [name]'s family history includes Arthritis in her brother; CAD in her brother; Diabetes in her father, maternal grandmother, and sister; Fibromyalgia in her sister and sister; Hypertension in her father and mother; Kidney disease in her father; Lung cancer in her maternal grandfather; Migraines in her brother and sister; Prostate cancer in her father; Rheum arthritis in her  mother.      Objective:    Vitals:   04/03/18 1007  BP: (!) 150/90  Pulse: 67    Physical Exam; well-developed African-American female in no acute distress, pleasant blood pressure 150/90 pulse 67, BMI 38.  HEENT; nontraumatic normocephalic EOMI PERRLA sclera anicteric oral mucosa moist, Cardiovascular; regular rate and rhythm with S1-S2 no murmur rub or gallop, Pulmonary; clear bilaterally, Abdomen ;obese, soft she is tender in the left mid and left lower quadrant no guarding or rebound no palpable mass or hepatosplenomegaly, Rectal ;exam not done, Extremities ;no clubbing cyanosis or edema skin warm and dry, Neuro psych; alert and oriented, grossly nonfocal mood and affect appropriate       Assessment & Plan:   #73 57 year old African-American female with 2 recent episodes of acute left-sided abdominal pain associated with nausea and vomiting.  The initial episode was in August and she was hospitalized, the second episode required an ER visit. CT scans were done for both episodes and both showed mild left colon wall thickening, and some fat stranding in the left lower quadrant consistent with colitis  Etiology of these episodes is not clear, she is currently improved but continuing to complain of left lower quadrant discomfort.  Question a new onset of segmental colitis, rule out segmental colitis associated with diverticulosis, consider angioedema.  #2 adult onset diabetes mellitus #3.  GERD stable, status post esophageal dilation #4 Gastroparesis- stable #5 Fibromyalgia #6 large hepatic hemangioma with some increase in size over the past year, currently 7.7 x 8.6 cm #7 history of chronic anemia/iron deficiency  Plan; check sed rate and CRP today, check C1 esterase level and C4 Advised  patient keep  very well-hydrated. Will schedule patient for flexible sigmoidoscopy with Dr. Havery Moros.  Procedure was discussed in detail with patient including indications risks and benefits and  she is agreeable to proceed She will continue Pepcid 20 mg p.o. twice daily Continue MiraLAX daily as needed Continue Bentyl 10 mg every 6-8 hours as needed for cramping.  Denise Deyarmin S Haley Fuerstenberg PA-C 04/03/2018   Cc: [name], NP

## 2018-04-03 NOTE — Patient Instructions (Signed)
Your provider has requested that you go to the basement level for lab work before leaving today. Press "B" on the elevator. The lab is located at the first door on the left as you exit the elevator.  You have been scheduled for a flexible sigmoidoscopy. Please follow the written instructions given to you at your visit today. If you use inhalers (even only as needed), please bring them with you on the day of your procedure.   Normal BMI (Body Mass Index- based on height and weight) is between 19 and 25. Your BMI today is Body mass index is 38 kg/m. Marland Kitchen Please consider follow up  regarding your BMI with your Primary Care Provider.

## 2018-04-03 NOTE — Progress Notes (Signed)
Agree with assessment and plan as outlined. Agree that ischemic colitis is possible although presentation a bit atypical if she had no diarrhea or bleeding. Flex sig is a good idea to follow up CT scan and ensure no chronicity to the findings.

## 2018-04-04 ENCOUNTER — Encounter

## 2018-04-04 ENCOUNTER — Ambulatory Visit (INDEPENDENT_AMBULATORY_CARE_PROVIDER_SITE_OTHER): Payer: Medicare PPO | Admitting: Family

## 2018-04-04 ENCOUNTER — Encounter: Payer: Self-pay | Admitting: Family

## 2018-04-04 VITALS — BP 155/78 | HR 56 | Temp 98.9°F | Resp 16 | Ht 64.0 in | Wt 220.0 lb

## 2018-04-04 DIAGNOSIS — R109 Unspecified abdominal pain: Secondary | ICD-10-CM

## 2018-04-04 DIAGNOSIS — Z23 Encounter for immunization: Secondary | ICD-10-CM

## 2018-04-04 DIAGNOSIS — I10 Essential (primary) hypertension: Secondary | ICD-10-CM

## 2018-04-04 LAB — C1 ESTERASE INHIBITOR: C1INH SerPl-mCnc: 33 mg/dL (ref 21–39)

## 2018-04-04 LAB — C4 COMPLEMENT: C4 Complement: 41 mg/dL (ref 15–57)

## 2018-04-04 MED ORDER — AMLODIPINE BESYLATE 10 MG PO TABS: 10.0000 mg | ORAL_TABLET | Freq: Every day | ORAL | 3 refills | Status: DC

## 2018-04-04 NOTE — Progress Notes (Signed)
Subjective:    Patient ID: [name], female    DOB: 1960/08/18, 57 y.o.   MRN: [id number]  HPI   Patient was seen in the emergency department on 03/17/2018.  She complained of left-sided abdominal pain that day.  A CT of the abdomen was performed which question colitis.  She was prescribed oral antibiotics and discharged home.  She saw gastroenterology yesterday.  Have scheduled her for flexible sigmoidoscopy for further evaluation.  Reports mild pain LUQ currently.  This is overall improved.  HTN- maintained on amlodipine 5mg .   BP Readings from Last 3 Encounters:  04/04/18 (!) 155/78  04/03/18 (!) 150/90  03/17/18 124/71   Review of Systems Past Medical History:  Diagnosis Date  . Allergy    allergic rhinitis  . Arthritis   . Atypical chest pain   . Constipation   . Cyst, ovarian   . Diabetes mellitus, type II ([place])   . Endometriosis   . Esophageal stricture   . Gastroparesis   . GERD (gastroesophageal reflux disease)   . Heart murmur   . Hyperlipidemia   . Hypertension   . 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 Iron deficiency anemia   . Migraine headache   . Non-compliance   . Peptic ulcer disease   . Thyroid disease      Social History   Socioeconomic History  . Marital status: Widowed    Spouse name: Not on file  . Number of children: 2  . Years of education: Not on file  . Highest education level: Not on file  Occupational History  . Occupation: DISABILITY    Employer: UNEMPLOYED  Social Needs  . Financial resource strain: Not on file  . Food insecurity:    Worry: Not on file    Inability: Not on file  . Transportation needs:    Medical: Not on file    Non-medical: Not on file  Tobacco Use  . Smoking status: Former Smoker    Packs/day: 0.50    Years: 18.00    Pack years: 9.00    Types: Cigarettes    Start date: 07/29/1977    Last attempt to quit: 06/25/1994    Years since quitting:  23.7  . Smokeless tobacco: Never Used  . Tobacco comment: quit 19 years ago  Substance and Sexual Activity  . Alcohol use: Yes    Alcohol/week: 0.0 standard drinks    Comment: social drinker  . Drug use: No  . Sexual activity: Not Currently  Lifestyle  . Physical activity:    Days per week: Not on file    Minutes per session: Not on file  . Stress: Not on file  Relationships  . Social connections:    Talks on phone: Not on file    Gets together: Not on file    Attends religious service: Not on file    Active member of club or organization: Not on file    Attends meetings of clubs or organizations: Not on file    Relationship status: Not on file  . Intimate partner violence:    Fear of current or ex partner: Not on file    Emotionally abused: Not on file    Physically abused: Not on file    Forced sexual activity: Not on file  Other Topics Concern  . Not on file  Social History Narrative   Widowed   Has 2 grown children.  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   Former Smoker Quit tobacco in 1996.  She was a pack a day smoker for approximately 10 years.   Alcohol use-yes: Social    Daily Caffeine Use:6 pack of pepsi daily     Illicit Drug Use - no    Patient does not get regular exercise.       Smoking Status:  quit    Past Surgical History:  Procedure Laterality Date  . ABDOMINAL EXPLORATION SURGERY     w/bso   . CARDIAC CATHETERIZATION  2009   mild non obstructive CAD  . CHOLECYSTECTOMY    . KNEE SURGERY  2005    left knee  . LEFT HEART CATH AND CORONARY ANGIOGRAPHY N/A 07/17/2017   Procedure: LEFT HEART CATH AND CORONARY ANGIOGRAPHY;  Surgeon: Martinique, Peter M, MD;  Location: [facility];  Service: Cardiovascular;  Laterality: N/A;  . TOTAL ABDOMINAL HYSTERECTOMY      Family History  Problem Relation Age of Onset  . Hypertension Mother   . Rheum arthritis Mother   . Hypertension Father   . Diabetes Father    . Prostate cancer Father   . Kidney disease Father   . Diabetes Sister   . Fibromyalgia Sister   . Diabetes Maternal Grandmother   . Lung cancer Maternal Grandfather   . Arthritis Brother   . Migraines Brother   . CAD Brother   . Fibromyalgia Sister   . Migraines Sister   . Colon cancer Neg Hx   . Thyroid disease Neg Hx     Allergies  Allergen Reactions  . Ace Inhibitors     REACTION: chronic cough  . Celebrex [Celecoxib] Other (See Comments)    "makes me bleed"  . Diovan [Valsartan]     angioedema    Current Outpatient Medications on File Prior to Visit  Medication Sig Dispense Refill  . aspirin 81 MG chewable tablet Chew 1 tablet (81 mg total) by mouth daily.    Marland Kitchen ezetimibe (ZETIA) 10 MG tablet Take 1 tablet (10 mg total) by mouth daily. 90 tablet 3  . famotidine (PEPCID) 20 MG tablet Take 1 tablet (20 mg total) by mouth 2 (two) times daily. 60 tablet 11  . methimazole (TAPAZOLE) 5 MG tablet TAKE 1 TABLET BY MOUTH THREE TIMES  A WEEK (Patient taking differently: Take 5 mg by mouth 3 (three) times a week. Monday, Wednesday, Friday) 12 tablet 5  . methocarbamol (ROBAXIN) 500 MG tablet Take 1 tablet (500 mg total) by mouth every 8 (eight) hours as needed for muscle spasms. 20 tablet 0  . nystatin (MYCOSTATIN/NYSTOP) powder Apply topically 2 (two) times daily. 60 g 0  . ondansetron (ZOFRAN) 4 MG tablet Take 1 tab by mouth every 6 hours as needed for nausea. 100 tablet 3  . polyethylene glycol (MIRALAX / GLYCOLAX) packet Take 17 g by mouth 2 (two) times daily. 14 each 0  . potassium chloride SA (K-DUR,KLOR-CON) 20 MEQ tablet Take 1 tablet (20 mEq total) by mouth daily. 30 tablet 5  . rosuvastatin (CRESTOR) 40 MG tablet Take 1 tablet (40 mg total) by mouth every other day. 45 tablet 1  . SUMAtriptan (IMITREX) 50 MG tablet Take 50 mg by mouth every 2 (two) hours as needed for migraine. May repeat in 2 hours if headache persists or recurs.    . traMADol (ULTRAM) 50 MG tablet Take 1  tablet (50 mg total) by mouth every 8 (  eight) hours as needed. 30 tablet 0  . traZODone (DESYREL) 50 MG tablet Take 0.5-1 tablets (25-50 mg total) by mouth at bedtime as needed for sleep. 30 tablet 3  . metoCLOPramide (REGLAN) 5 MG tablet Take 1 tablet (5 mg total) by mouth 3 (three) times daily before meals for 6 days. 18 tablet 0  . [DISCONTINUED] atorvastatin (LIPITOR) 40 MG tablet Take 1 tablet (40 mg total) by mouth daily. 30 tablet 3   No current facility-administered medications on file prior to visit.     BP (!) 155/78 (BP Location: Left Arm, Patient Position: Sitting, Cuff Size: Small)   Pulse (!) 56   Temp 98.9 F (37.2 C) (Oral)   Resp 16   Ht 5\' 4"  (1.626 m)   Wt 220 lb (99.8 kg)   SpO2 100%   BMI 37.76 kg/m       Objective:   Physical Exam  Constitutional: She appears well-developed and well-nourished.  Cardiovascular: Normal rate, regular rhythm and normal heart sounds.  No murmur heard. Pulmonary/Chest: Effort normal and breath sounds normal. No respiratory distress. She has no wheezes.  Abdominal: Bowel sounds are normal. There is no tenderness.  Psychiatric: She has a normal mood and affect. Her behavior is normal. Judgment and thought content normal.          Assessment & Plan:  HTN- uncontrolled. Increase amlodipine from 5mg  to 10mg .   Abdominal pain-this is being evaluated by gastroenterology and is overall improved. prevnar today.

## 2018-04-04 NOTE — Addendum Note (Signed)
Addended by: Jiles Prows on: 04/04/2018 03:21 PM   Modules accepted: Orders

## 2018-04-04 NOTE — Patient Instructions (Signed)
Please increase amlodipine from 5 mg to 10 mg.

## 2018-04-10 ENCOUNTER — Encounter: Payer: Self-pay | Admitting: Gastroenterology

## 2018-04-10 ENCOUNTER — Ambulatory Visit (AMBULATORY_SURGERY_CENTER): Payer: Medicare PPO | Admitting: Gastroenterology

## 2018-04-10 VITALS — BP 156/82 | HR 61 | Temp 97.5°F | Resp 14 | Ht 64.0 in | Wt 221.0 lb

## 2018-04-10 DIAGNOSIS — R1032 Left lower quadrant pain: Secondary | ICD-10-CM

## 2018-04-10 DIAGNOSIS — K219 Gastro-esophageal reflux disease without esophagitis: Secondary | ICD-10-CM | POA: Diagnosis not present

## 2018-04-10 DIAGNOSIS — R933 Abnormal findings on diagnostic imaging of other parts of digestive tract: Secondary | ICD-10-CM

## 2018-04-10 DIAGNOSIS — I10 Essential (primary) hypertension: Secondary | ICD-10-CM | POA: Diagnosis not present

## 2018-04-10 DIAGNOSIS — K5730 Diverticulosis of large intestine without perforation or abscess without bleeding: Secondary | ICD-10-CM | POA: Diagnosis not present

## 2018-04-10 DIAGNOSIS — I2510 Atherosclerotic heart disease of native coronary artery without angina pectoris: Secondary | ICD-10-CM | POA: Diagnosis not present

## 2018-04-10 DIAGNOSIS — K599 Functional intestinal disorder, unspecified: Secondary | ICD-10-CM | POA: Diagnosis not present

## 2018-04-10 MED ORDER — SODIUM CHLORIDE 0.9 % IV SOLN: 500.0000 mL | Freq: Once | INTRAVENOUS | Status: DC

## 2018-04-10 NOTE — Patient Instructions (Signed)
Continue present medications    YOU HAD AN ENDOSCOPIC PROCEDURE TODAY AT [facility]:   Refer to the procedure report that was given to you for any specific questions about what was found during the examination.  If the procedure report does not answer your questions, please call your gastroenterologist to clarify.  If you requested that your care partner not be given the details of your procedure findings, then the procedure report has been included in a sealed envelope for you to review at your convenience later.  YOU SHOULD EXPECT: Some feelings of bloating in the abdomen. Passage of more gas than usual.  Walking can help get rid of the air that was put into your GI tract during the procedure and reduce the bloating. If you had a lower endoscopy (such as a colonoscopy or flexible sigmoidoscopy) you may notice spotting of blood in your stool or on the toilet paper. If you underwent a bowel prep for your procedure, you may not have a normal bowel movement for a few days.  Please Note:  You might notice some irritation and congestion in your nose or some drainage.  This is from the oxygen used during your procedure.  There is no need for concern and it should clear up in a day or so.  SYMPTOMS TO REPORT IMMEDIATELY:   Following lower endoscopy (colonoscopy or flexible sigmoidoscopy):  Excessive amounts of blood in the stool  Significant tenderness or worsening of abdominal pains  Swelling of the abdomen that is new, acute  Fever of 100F or higher   For urgent or emergent issues, a gastroenterologist can be reached at any hour by calling [phone].   DIET:  We do recommend a small meal at first, but then you may proceed to your regular diet.  Drink plenty of fluids but you should avoid alcoholic beverages for 24 hours.  ACTIVITY:  You should plan to take it easy for the rest of today and you should NOT DRIVE or use heavy machinery until tomorrow (because of the sedation  medicines used during the test).    FOLLOW UP: Our staff will call the number listed on your records the next business day following your procedure to check on you and address any questions or concerns that you may have regarding the information given to you following your procedure. If we do not reach you, we will leave a message.  However, if you are feeling well and you are not experiencing any problems, there is no need to return our call.  We will assume that you have returned to your regular daily activities without incident.  If any biopsies were taken you will be contacted by phone or by letter within the next 1-3 weeks.  Please call us at [phone] if you have not heard about the biopsies in 3 weeks.    SIGNATURES/CONFIDENTIALITY: You and/or your care partner have signed paperwork which will be entered into your electronic medical record.  These signatures attest to the fact that that the information above on your After Visit Summary has been reviewed and is understood.  Full responsibility of the confidentiality of this discharge information lies with you and/or your care-partner.

## 2018-04-10 NOTE — Op Note (Signed)
[facility] Patient Name: [name] Procedure Date: 04/10/2018 11:49 AM MRN: [id number] Endoscopist: Remo Lipps P. Havery Moros , MD Age: 57 Referring MD:  Date of Birth: 1961/02/12 Gender: Female Account #: [id number] Procedure:                Flexible Sigmoidoscopy Indications:              Abnormal CT of the GI tract - recurrent episodic                            LLQ abdominal pain with some inflammatory changes                            of the left colon noted on CT scan Medicines:                Monitored Anesthesia Care Procedure:                Pre-Anesthesia Assessment:                           - Prior to the procedure, a History and Physical                            was performed, and patient medications and                            allergies were reviewed. The patient's tolerance of                            previous anesthesia was also reviewed. The risks                            and benefits of the procedure and the sedation                            options and risks were discussed with the patient.                            All questions were answered, and informed consent                            was obtained. Prior Anticoagulants: The patient has                            taken no previous anticoagulant or antiplatelet                            agents. ASA Grade Assessment: II - A patient with                            mild systemic disease. After reviewing the risks                            and benefits, the patient was deemed in  satisfactory condition to undergo the procedure.                           After obtaining informed consent, the scope was                            passed under direct vision. The Colonoscope was                            introduced through the anus and advanced to the the                            left transverse colon. The flexible sigmoidoscopy                            was  accomplished without difficulty. The patient                            tolerated the procedure well. The quality of the                            bowel preparation was poor. Scope In: Scope Out: Findings:                 The perianal and digital rectal examinations were                            normal.                           A moderate amount of semi-liquid stool was found in                            the rectum, in the sigmoid colon, in the descending                            colon and in the transverse colon, making                            visualization difficult. The prep was poor and                            polyps may not have been appreciated.                           Of what was visualized of the the rectum, sigmoid                            colon, descending colon and distal transverse                            colon, the mucosa appeared normal without any                            inflammatory  changes. The patient has known                            diverticulosis but these were not visualized due to                            prep. There was a sharply angulated turn in the                            sigmoid colon but mucosa appeared healthy. Biopsies                            were taken with a cold forceps for histology. Complications:            No immediate complications. Estimated blood loss:                            Minimal. Estimated Blood Loss:     Estimated blood loss was minimal. Impression:               - Preparation of the colon was poor, inadquate for                            assessing for polyps.                           - Of the visualized mucosa in the left colon, it                            appeared normal without any inflammatory changes.                            Biopsied.                           Hervey Ard / angulated turn in the sigmoid colon                           Overall, ischemic colitis remains possible to                             account for prior symptoms although nothing overtly                            appreciated on today's exam. Recommendation:           - Discharge patient to home.                           - Resume previous diet.                           - Continue present medications.                           - Await pathology results. Remo Lipps P. Havery Moros, MD 04/10/2018 12:22:41 PM  This report has been signed electronically.

## 2018-04-10 NOTE — Progress Notes (Signed)
Report to PACU, RN, vss, BBS= Clear.  

## 2018-04-10 NOTE — Progress Notes (Signed)
Called to room to assist during endoscopic procedure.  Patient ID and intended procedure confirmed with present staff. Received instructions for my participation in the procedure from the performing physician.  

## 2018-04-10 NOTE — Progress Notes (Signed)
I have reviewed the patient's medical history in detail and updated the computerized patient record.

## 2018-04-11 ENCOUNTER — Telehealth: Payer: Self-pay | Admitting: *Deleted

## 2018-04-11 NOTE — Telephone Encounter (Signed)
  Follow up Call-  Call back number 04/10/2018 07/16/2017  Post procedure Call Back phone  # [phone]  Permission to leave phone message Yes Yes  Some recent data might be hidden     Patient questions:  Do you have a fever, pain , or abdominal swelling? No. Pain Score  0 *  Have you tolerated food without any problems? Yes.    Have you been able to return to your normal activities? Yes.    Do you have any questions about your discharge instructions: Diet   No. Medications  No. Follow up visit  No.  Do you have questions or concerns about your Care? No.  Actions: * If pain score is 4 or above: No action needed, pain <4.

## 2018-04-16 ENCOUNTER — Encounter: Payer: Self-pay | Admitting: Gastroenterology

## 2018-04-23 ENCOUNTER — Ambulatory Visit: Payer: Medicare PPO | Admitting: Family

## 2018-04-28 ENCOUNTER — Other Ambulatory Visit: Payer: Self-pay | Admitting: Family

## 2018-04-28 MED ORDER — ROSUVASTATIN CALCIUM 40 MG PO TABS: 40.0000 mg | ORAL_TABLET | ORAL | 1 refills | Status: DC

## 2018-04-28 NOTE — Telephone Encounter (Signed)
Copied from [place] [phone]. Topic: Quick Communication - See Telephone Encounter >> Apr 28, 2018 11:29 AM Conception Chancy, NT wrote: CRM for notification. See Telephone encounter for: 04/28/18.  Patient is calling and requesting a refill on rosuvastatin (CRESTOR) 40 MG tablet.  [facility] ([address] [place] [address] [place] ([place]) [place] [address] Phone: [phone] Fax: [phone]

## 2018-05-01 ENCOUNTER — Inpatient Hospital Stay: Payer: Medicare PPO

## 2018-05-01 ENCOUNTER — Inpatient Hospital Stay: Payer: Medicare PPO | Attending: Family | Admitting: Hematology & Oncology

## 2018-05-01 ENCOUNTER — Other Ambulatory Visit: Payer: Self-pay

## 2018-05-01 ENCOUNTER — Encounter: Payer: Self-pay | Admitting: Hematology & Oncology

## 2018-05-01 VITALS — BP 161/83 | HR 60 | Temp 98.2°F | Resp 18 | Wt 222.0 lb

## 2018-05-01 DIAGNOSIS — Z79899 Other long term (current) drug therapy: Secondary | ICD-10-CM | POA: Diagnosis not present

## 2018-05-01 DIAGNOSIS — D509 Iron deficiency anemia, unspecified: Secondary | ICD-10-CM | POA: Insufficient documentation

## 2018-05-01 DIAGNOSIS — E0590 Thyrotoxicosis, unspecified without thyrotoxic crisis or storm: Secondary | ICD-10-CM | POA: Diagnosis not present

## 2018-05-01 DIAGNOSIS — Z7982 Long term (current) use of aspirin: Secondary | ICD-10-CM | POA: Insufficient documentation

## 2018-05-01 DIAGNOSIS — D500 Iron deficiency anemia secondary to blood loss (chronic): Secondary | ICD-10-CM

## 2018-05-01 DIAGNOSIS — R7303 Prediabetes: Secondary | ICD-10-CM

## 2018-05-01 LAB — CBC WITH DIFFERENTIAL (CANCER CENTER ONLY)
Abs Immature Granulocytes: 0.02 10*3/uL (ref 0.00–0.07)
Basophils Absolute: 0 10*3/uL (ref 0.0–0.1)
Basophils Relative: 0 %
Eosinophils Absolute: 0.1 10*3/uL (ref 0.0–0.5)
Eosinophils Relative: 2 %
HCT: 37.6 % (ref 36.0–46.0)
Hemoglobin: 10.9 g/dL — ABNORMAL LOW (ref 12.0–15.0)
Immature Granulocytes: 0 %
Lymphocytes Relative: 39 %
Lymphs Abs: 2.7 10*3/uL (ref 0.7–4.0)
MCH: 21.8 pg — ABNORMAL LOW (ref 26.0–34.0)
MCHC: 29 g/dL — ABNORMAL LOW (ref 30.0–36.0)
MCV: 75.4 fL — ABNORMAL LOW (ref 80.0–100.0)
Monocytes Absolute: 0.4 10*3/uL (ref 0.1–1.0)
Monocytes Relative: 7 %
Neutro Abs: 3.5 10*3/uL (ref 1.7–7.7)
Neutrophils Relative %: 52 %
Platelet Count: 207 10*3/uL (ref 150–400)
RBC: 4.99 MIL/uL (ref 3.87–5.11)
RDW: 14.2 % (ref 11.5–15.5)
WBC Count: 6.8 10*3/uL (ref 4.0–10.5)
nRBC: 0 % (ref 0.0–0.2)

## 2018-05-01 LAB — CMP (CANCER CENTER ONLY)
ALT: 22 U/L (ref 0–44)
AST: 18 U/L (ref 15–41)
Albumin: 3.6 g/dL (ref 3.5–5.0)
Alkaline Phosphatase: 126 U/L (ref 38–126)
Anion gap: 6 (ref 5–15)
BUN: 14 mg/dL (ref 6–20)
CO2: 29 mmol/L (ref 22–32)
Calcium: 9.3 mg/dL (ref 8.9–10.3)
Chloride: 106 mmol/L (ref 98–111)
Creatinine: 0.77 mg/dL (ref 0.44–1.00)
GFR, Est AFR Am: >60 mL/min (ref >60)
GFR, Estimated: >60 mL/min (ref >60)
Glucose, Bld: 95 mg/dL (ref 70–99)
Potassium: 4 mmol/L (ref 3.5–5.1)
Sodium: 141 mmol/L (ref 135–145)
Total Bilirubin: 0.3 mg/dL (ref 0.3–1.2)
Total Protein: 7.4 g/dL (ref 6.5–8.1)

## 2018-05-01 LAB — RETICULOCYTES
Immature Retic Fract: 5.3 % (ref 2.3–15.9)
RBC.: 4.99 MIL/uL (ref 3.87–5.11)
Retic Count, Absolute: 45.4 10*3/uL (ref 19.0–186.0)
Retic Ct Pct: 0.9 % (ref 0.4–3.1)

## 2018-05-01 NOTE — Progress Notes (Signed)
Hematology and Oncology Follow Up Visit  [name] [id number] [birth year] 57 y.o. 05/01/2018   Principle Diagnosis:  Recurrent iron deficiency anemia  Current Therapy:   IV iron as indicated - last dose in May 2017   Interim History: [name] is here today for follow-up.  She is doing okay although she does feel quite tired and fatigued.  She has had some rectal bleeding.  She did have a colonoscopy done.  Looks like she was found to have some hemorrhoids.  We will see what her iron studies look like.  She is still getting over the death of a grandson.  He had recurrent Hodgkin's disease.  He was only 59 years old.  He passed away in December 31, 2022.  She has had no cough.  There is been no shortness of breath.  She does get fatigued quite easily.  Overall, her performance status is ECOG 1.    Medications:  Allergies as of 05/01/2018      Reactions   Ace Inhibitors    REACTION: chronic cough   Celebrex [celecoxib] Other (See Comments)   "makes me bleed"   Diovan [valsartan]    angioedema      Medication List        Accurate as of 05/01/18  1:39 PM. Always use your most recent med list.          amLODipine 10 MG tablet Commonly known as:  NORVASC Take 1 tablet (10 mg total) by mouth daily.   aspirin 81 MG chewable tablet Chew 1 tablet (81 mg total) by mouth daily.   ezetimibe 10 MG tablet Commonly known as:  ZETIA Take 1 tablet (10 mg total) by mouth daily.   famotidine 20 MG tablet Commonly known as:  PEPCID Take 1 tablet (20 mg total) by mouth 2 (two) times daily.   methimazole 5 MG tablet Commonly known as:  TAPAZOLE TAKE 1 TABLET BY MOUTH THREE TIMES  A WEEK   methocarbamol 500 MG tablet Commonly known as:  ROBAXIN Take 1 tablet (500 mg total) by mouth every 8 (eight) hours as needed for muscle spasms.   metoCLOPramide 5 MG tablet Commonly known as:  REGLAN Take 1 tablet (5 mg total) by mouth 3 (three) times daily before meals for 6 days.   nystatin  powder Commonly known as:  MYCOSTATIN/NYSTOP Apply topically 2 (two) times daily.   ondansetron 4 MG tablet Commonly known as:  ZOFRAN Take 1 tab by mouth every 6 hours as needed for nausea.   polyethylene glycol packet Commonly known as:  MIRALAX / GLYCOLAX Take 17 g by mouth 2 (two) times daily.   potassium chloride SA 20 MEQ tablet Commonly known as:  K-DUR,KLOR-CON Take 1 tablet (20 mEq total) by mouth daily.   rosuvastatin 40 MG tablet Commonly known as:  CRESTOR Take 1 tablet (40 mg total) by mouth every other day.   SUMAtriptan 50 MG tablet Commonly known as:  IMITREX Take 50 mg by mouth every 2 (two) hours as needed for migraine. May repeat in 2 hours if headache persists or recurs.   traMADol 50 MG tablet Commonly known as:  ULTRAM Take 1 tablet (50 mg total) by mouth every 8 (eight) hours as needed.   traZODone 50 MG tablet Commonly known as:  DESYREL Take 0.5-1 tablets (25-50 mg total) by mouth at bedtime as needed for sleep.       Allergies:  Allergies  Allergen Reactions  . Ace Inhibitors  REACTION: chronic cough  . Celebrex [Celecoxib] Other (See Comments)    "makes me bleed"  . Diovan [Valsartan]     angioedema    Past Medical History, Surgical history, Social history, and Family History were reviewed and updated.  Review of Systems: All other 10 point review of systems is negative.   Physical Exam:  weight is 222 lb (100.7 kg). Her oral temperature is 98.2 F (36.8 C). Her blood pressure is 161/83 (abnormal) and her pulse is 60. Her respiration is 18 and oxygen saturation is 100%.   Wt Readings from Last 3 Encounters:  05/01/18 222 lb (100.7 kg)  04/10/18 221 lb (100.2 kg)  04/04/18 220 lb (99.8 kg)    Ocular: Sclerae unicteric, pupils equal, round and reactive to light Ear-nose-throat: Oropharynx clear, dentition fair Lymphatic: No cervical, supraclavicular or axillary adenopathy Lungs no rales or rhonchi, good excursion  bilaterally Heart regular rate and rhythm, no murmur appreciated Abd soft, nontender, positive bowel sounds, no liver or spleen tip palpated on exam, no fluid wave  MSK no focal spinal tenderness, no joint edema Neuro: non-focal, well-oriented, appropriate affect Breasts: Deferred   Lab Results  Component Value Date   WBC 6.8 05/01/2018   HGB 10.9 (L) 05/01/2018   HCT 37.6 05/01/2018   MCV 75.4 (L) 05/01/2018   PLT 207 05/01/2018   Lab Results  Component Value Date   FERRITIN 1,008 (H) 01/30/2018   IRON 61 01/30/2018   TIBC 274 01/30/2018   UIBC 213 01/30/2018   IRONPCTSAT 22 01/30/2018   Lab Results  Component Value Date   RETICCTPCT 0.9 05/01/2018   RBC 4.99 05/01/2018   RBC 4.99 05/01/2018   RETICCTABS 48.5 04/08/2015   No results found for: [facility] P[facility] Lab Results  Component Value Date   IGA 305 05/04/2009   Lab Results  Component Value Date   TOTALPROTELP 7.2 05/04/2009   ALBUMINELP 49.1 (L) 05/04/2009   A1GS 8.3 (H) 05/04/2009   A2GS 14.1 (H) 05/04/2009   BETS 6.6 05/04/2009   GAMS 16.4 05/04/2009   MSPIKE NOT DET 05/04/2009     Chemistry      Component Value Date/Time   NA 142 03/17/2018 1227   NA 140 08/26/2017 1112   NA 139 12/14/2016 0849   K 3.8 03/17/2018 1227   K 3.7 12/14/2016 0849   CL 108 03/17/2018 1227   CL 99 08/16/2015 1055   CO2 26 03/17/2018 1227   CO2 25 12/14/2016 0849   BUN 13 03/17/2018 1227   BUN 15 08/26/2017 1112   BUN 11.4 12/14/2016 0849   CREATININE 0.85 03/17/2018 1227   CREATININE 0.85 01/30/2018 1220   CREATININE 0.9 12/14/2016 0849      Component Value Date/Time   CALCIUM 8.8 (L) 03/17/2018 1227   CALCIUM 9.6 12/14/2016 0849   ALKPHOS 96 03/17/2018 1227   ALKPHOS 135 12/14/2016 0849   AST 16 03/17/2018 1227   AST 24 01/30/2018 1220   AST 22 12/14/2016 0849   ALT 16 03/17/2018 1227   ALT 30 01/30/2018 1220   ALT 32 12/14/2016 0849   BILITOT 0.3 03/17/2018 1227   BILITOT 0.4  01/30/2018 1220   BILITOT 0.29 12/14/2016 0849      Impression and Plan: [name] is a very pleasant 57 yo African American female with history of iron deficiency anemia.   She is symptomatic as mentioned above.  We will see what her iron studies show and bring her back in for infusion if  needed.   I would like to see her back in 4 weeks.  I think we have to keep a close observation on her so that we can make sure that her blood is optimal for the holidays.  [name], MD 11/7/20191:39 PM

## 2018-05-02 LAB — FERRITIN: Ferritin: 686 ng/mL — ABNORMAL HIGH (ref 11–307)

## 2018-05-02 LAB — IRON AND TIBC
Iron: 62 ug/dL (ref 41–142)
Saturation Ratios: 25 % (ref 21–57)
TIBC: 253 ug/dL (ref 236–444)
UIBC: 191 ug/dL (ref 120–384)

## 2018-05-07 ENCOUNTER — Other Ambulatory Visit: Payer: Self-pay | Admitting: Family

## 2018-05-12 ENCOUNTER — Ambulatory Visit (INDEPENDENT_AMBULATORY_CARE_PROVIDER_SITE_OTHER): Payer: Medicare PPO | Admitting: Family

## 2018-05-12 ENCOUNTER — Encounter: Payer: Self-pay | Admitting: Family

## 2018-05-12 VITALS — BP 150/74 | HR 65 | Temp 98.3°F | Resp 16 | Ht 64.0 in | Wt 222.0 lb

## 2018-05-12 DIAGNOSIS — I10 Essential (primary) hypertension: Secondary | ICD-10-CM

## 2018-05-12 DIAGNOSIS — R5383 Other fatigue: Secondary | ICD-10-CM | POA: Diagnosis not present

## 2018-05-12 NOTE — Progress Notes (Signed)
New Patient Office Visit  Subjective:  Patient ID: [name], female    DOB: 1961/05/18  Age: 57 y.o. MRN: [id number]  CC:  Chief Complaint  Patient presents with  . Hypertension    Here for follow up    HPI [name] presents for follow up of her hypertension. Last visit we increased amlodipine from 5mg  to 10mg .   BP Readings from Last 3 Encounters:  05/12/18 (!) 150/74  05/01/18 (!) 161/83  04/10/18 (!) 156/82   She does report feeling mildly tired.  Notes that she recently saw hematology. They did not give her any iv iron.   Past Medical History:  Diagnosis Date  . Allergy    allergic rhinitis  . Arthritis   . Atypical chest pain   . Constipation   . Cyst, ovarian   . Diabetes mellitus, type II ([place])   . Endometriosis   . Esophageal stricture   . Gastroparesis   . GERD (gastroesophageal reflux disease)   . Heart murmur   . Hyperlipidemia   . Hypertension   . 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 Iron deficiency anemia   . Migraine headache   . Non-compliance   . Peptic ulcer disease   . Thyroid disease     Past Surgical History:  Procedure Laterality Date  . ABDOMINAL EXPLORATION SURGERY     w/bso   . CARDIAC CATHETERIZATION  2009   mild non obstructive CAD  . CHOLECYSTECTOMY    . COLONOSCOPY    . KNEE SURGERY  2005    left knee  . LEFT HEART CATH AND CORONARY ANGIOGRAPHY N/A 07/17/2017   Procedure: LEFT HEART CATH AND CORONARY ANGIOGRAPHY;  Surgeon: Martinique, Peter M, MD;  Location: [facility];  Service: Cardiovascular;  Laterality: N/A;  . POLYPECTOMY    . TOTAL ABDOMINAL HYSTERECTOMY    . UPPER GASTROINTESTINAL ENDOSCOPY      Family History  Problem Relation Age of Onset  . Hypertension Mother   . Rheum arthritis Mother   . Hypertension Father   . Diabetes Father   . Prostate cancer Father   . Kidney disease Father   . Diabetes Sister   . Fibromyalgia Sister   .  Diabetes Maternal Grandmother   . Lung cancer Maternal Grandfather   . Arthritis Brother   . Migraines Brother   . CAD Brother   . Fibromyalgia Sister   . Migraines Sister   . Colon cancer Neg Hx   . Thyroid disease Neg Hx   . Colon polyps Neg Hx     Social History   Socioeconomic History  . Marital status: Widowed    Spouse name: Not on file  . Number of children: 2  . Years of education: Not on file  . Highest education level: Not on file  Occupational History  . Occupation: DISABILITY    Employer: UNEMPLOYED  Social Needs  . Financial resource strain: Not on file  . Food insecurity:    Worry: Not on file    Inability: Not on file  . Transportation needs:    Medical: Not on file    Non-medical: Not on file  Tobacco Use  . Smoking status: Former Smoker    Packs/day: 0.50    Years: 18.00    Pack years: 9.00    Types: Cigarettes    Start date: 07/29/1977    Last attempt to quit: 06/25/1994  Years since quitting: 23.8  . Smokeless tobacco: Never Used  . Tobacco comment: quit 19 years ago  Substance and Sexual Activity  . Alcohol use: Yes    Alcohol/week: 0.0 standard drinks    Comment: social drinker  . Drug use: No  . Sexual activity: Not Currently  Lifestyle  . Physical activity:    Days per week: Not on file    Minutes per session: Not on file  . Stress: Not on file  Relationships  . Social connections:    Talks on phone: Not on file    Gets together: Not on file    Attends religious service: Not on file    Active member of club or organization: Not on file    Attends meetings of clubs or organizations: Not on file    Relationship status: Not on file  . Intimate partner violence:    Fear of current or ex partner: Not on file    Emotionally abused: Not on file    Physically abused: Not on file    Forced sexual activity: Not on file  Other Topics Concern  . Not on file  Social History Narrative   Widowed   Has 2 grown children.  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   Former Smoker Quit tobacco in 1996.  She was a pack a day smoker for approximately 10 years.   Alcohol use-yes: Social    Daily Caffeine Use:6 pack of pepsi daily     Illicit Drug Use - no    Patient does not get regular exercise.       Smoking Status:  quit    ROS  See HPI  Review of Systems  Objective:   Today's Vitals: There were no vitals taken for this visit.  Physical Exam  Constitutional: She is oriented to person, place, and time. She appears well-developed and well-nourished.  Cardiovascular: Normal rate, regular rhythm and normal heart sounds.  No murmur heard. Pulmonary/Chest: Effort normal and breath sounds normal. No respiratory distress. She has no wheezes.  Musculoskeletal: She exhibits no edema.  Neurological: She is alert and oriented to person, place, and time.  Skin: Skin is warm and dry.  Psychiatric: She has a normal mood and affect. Her behavior is normal. Judgment and thought content normal.    Assessment & Plan:  HTN- BP is improved.  Continue current dose of amlodipine.  Fatigue- mildly anemic- normal iron level. Defer management to hematology. She also is advised to schedule follow up with endo for her hyperthyroid.   Lab Results  Component Value Date   WBC 6.8 05/01/2018   HGB 10.9 (L) 05/01/2018   HCT 37.6 05/01/2018   MCV 75.4 (L) 05/01/2018   PLT 207 05/01/2018    Problem List Items Addressed This Visit    None      Outpatient Encounter Medications as of 05/12/2018  Medication Sig  . amLODipine (NORVASC) 10 MG tablet Take 1 tablet (10 mg total) by mouth daily.  Marland Kitchen aspirin 81 MG chewable tablet Chew 1 tablet (81 mg total) by mouth daily.  Marland Kitchen ezetimibe (ZETIA) 10 MG tablet Take 1 tablet (10 mg total) by mouth daily.  . famotidine (PEPCID) 20 MG tablet Take 1 tablet (20 mg total) by mouth 2 (two) times daily.  . methimazole (TAPAZOLE) 5 MG tablet TAKE 1 TABLET BY MOUTH  THREE TIMES  A WEEK (Patient taking differently: Take 5 mg by mouth 3 (three)  times a week. Monday, Wednesday, Friday)  . methocarbamol (ROBAXIN) 500 MG tablet Take 1 tablet (500 mg total) by mouth every 8 (eight) hours as needed for muscle spasms.  Marland Kitchen nystatin (MYCOSTATIN/NYSTOP) powder Apply topically 2 (two) times daily.  . ondansetron (ZOFRAN) 4 MG tablet Take 1 tab by mouth every 6 hours as needed for nausea.  . polyethylene glycol (MIRALAX / GLYCOLAX) packet Take 17 g by mouth 2 (two) times daily.  . potassium chloride SA (K-DUR,KLOR-CON) 20 MEQ tablet Take 1 tablet (20 mEq total) by mouth daily.  . rosuvastatin (CRESTOR) 40 MG tablet Take 1 tablet (40 mg total) by mouth every other day.  . SUMAtriptan (IMITREX) 50 MG tablet Take 50 mg by mouth every 2 (two) hours as needed for migraine. May repeat in 2 hours if headache persists or recurs.  . traMADol (ULTRAM) 50 MG tablet Take 1 tablet (50 mg total) by mouth every 8 (eight) hours as needed.  . traZODone (DESYREL) 50 MG tablet Take 0.5-1 tablets (25-50 mg total) by mouth at bedtime as needed for sleep.  . metoCLOPramide (REGLAN) 5 MG tablet Take 1 tablet (5 mg total) by mouth 3 (three) times daily before meals for 6 days.  . [DISCONTINUED] atorvastatin (LIPITOR) 40 MG tablet Take 1 tablet (40 mg total) by mouth daily.   Facility-Administered Encounter Medications as of 05/12/2018  Medication  . 0.9 %  sodium chloride infusion    Follow-up: No follow-ups on file.   [name], NP

## 2018-05-12 NOTE — Patient Instructions (Signed)
Please continue current medications.

## 2018-05-21 ENCOUNTER — Other Ambulatory Visit: Payer: Self-pay

## 2018-05-21 ENCOUNTER — Inpatient Hospital Stay: Payer: Medicare PPO

## 2018-05-21 ENCOUNTER — Inpatient Hospital Stay (HOSPITAL_BASED_OUTPATIENT_CLINIC_OR_DEPARTMENT_OTHER): Payer: Medicare PPO | Admitting: Hematology & Oncology

## 2018-05-21 ENCOUNTER — Telehealth: Payer: Self-pay | Admitting: *Deleted

## 2018-05-21 ENCOUNTER — Encounter: Payer: Self-pay | Admitting: Hematology & Oncology

## 2018-05-21 VITALS — BP 138/79 | HR 69 | Temp 97.9°F | Resp 17 | Ht 64.0 in | Wt 220.8 lb

## 2018-05-21 DIAGNOSIS — D500 Iron deficiency anemia secondary to blood loss (chronic): Secondary | ICD-10-CM

## 2018-05-21 DIAGNOSIS — Z7982 Long term (current) use of aspirin: Secondary | ICD-10-CM | POA: Diagnosis not present

## 2018-05-21 DIAGNOSIS — D509 Iron deficiency anemia, unspecified: Secondary | ICD-10-CM | POA: Diagnosis not present

## 2018-05-21 DIAGNOSIS — Z79899 Other long term (current) drug therapy: Secondary | ICD-10-CM | POA: Diagnosis not present

## 2018-05-21 DIAGNOSIS — E0590 Thyrotoxicosis, unspecified without thyrotoxic crisis or storm: Secondary | ICD-10-CM | POA: Diagnosis not present

## 2018-05-21 DIAGNOSIS — R7303 Prediabetes: Secondary | ICD-10-CM

## 2018-05-21 LAB — RETICULOCYTES
Immature Retic Fract: 10.4 % (ref 2.3–15.9)
RBC.: 5.47 MIL/uL — ABNORMAL HIGH (ref 3.87–5.11)
Retic Count, Absolute: 60.2 10*3/uL (ref 19.0–186.0)
Retic Ct Pct: 1.1 % (ref 0.4–3.1)

## 2018-05-21 LAB — IRON AND TIBC
Iron: 65 ug/dL (ref 41–142)
Saturation Ratios: 22 % (ref 21–57)
TIBC: 290 ug/dL (ref 236–444)
UIBC: 225 ug/dL (ref 120–384)

## 2018-05-21 LAB — CBC WITH DIFFERENTIAL (CANCER CENTER ONLY)
Abs Immature Granulocytes: 0.03 10*3/uL (ref 0.00–0.07)
Basophils Absolute: 0 10*3/uL (ref 0.0–0.1)
Basophils Relative: 0 %
Eosinophils Absolute: 0.1 10*3/uL (ref 0.0–0.5)
Eosinophils Relative: 1 %
HCT: 41.8 % (ref 36.0–46.0)
Hemoglobin: 12 g/dL (ref 12.0–15.0)
Immature Granulocytes: 0 %
Lymphocytes Relative: 43 %
Lymphs Abs: 2.9 10*3/uL (ref 0.7–4.0)
MCH: 21.9 pg — ABNORMAL LOW (ref 26.0–34.0)
MCHC: 28.7 g/dL — ABNORMAL LOW (ref 30.0–36.0)
MCV: 76.4 fL — ABNORMAL LOW (ref 80.0–100.0)
Monocytes Absolute: 0.4 10*3/uL (ref 0.1–1.0)
Monocytes Relative: 7 %
Neutro Abs: 3.2 10*3/uL (ref 1.7–7.7)
Neutrophils Relative %: 49 %
Platelet Count: 240 10*3/uL (ref 150–400)
RBC: 5.47 MIL/uL — ABNORMAL HIGH (ref 3.87–5.11)
RDW: 14.1 % (ref 11.5–15.5)
WBC Count: 6.7 10*3/uL (ref 4.0–10.5)
nRBC: 0 % (ref 0.0–0.2)

## 2018-05-21 LAB — FERRITIN: Ferritin: 753 ng/mL — ABNORMAL HIGH (ref 11–307)

## 2018-05-21 MED ORDER — CYANOCOBALAMIN 1000 MCG/ML IJ SOLN
INTRAMUSCULAR | Status: AC
  Filled 2018-05-21: qty 1

## 2018-05-21 NOTE — Progress Notes (Signed)
Hematology and Oncology Follow Up Visit  [name] [id number] 09-Feb-1961 57 y.o. 05/21/2018   Principle Diagnosis:  Recurrent iron deficiency anemia  Current Therapy:   IV iron as indicated - last dose in May 2017   Interim History: [name] is here today for follow-up.  She feels tired.  She does not have as much energy.  She is on medication for an overactive thyroid.  I do not think she has seen her endocrinologist for quite a while.  We last saw her in November, her ferritin was 686 with an iron saturation of 25%.  She has had no obvious bleeding.  She has had no rashes.  It has been a very tough year.  Her grandson passing on June at only 55 years old was very hard on her.  Her husband passed away a couple years ago.  She is eating okay.  She is had no nausea or vomiting.  There is been no fever.  She is had no cough.  Overall, her performance status is ECOG 1.    Medications:  Allergies as of 05/21/2018      Reactions   Ace Inhibitors    REACTION: chronic cough   Celebrex [celecoxib] Other (See Comments)   "makes me bleed"   Diovan [valsartan]    angioedema      Medication List        Accurate as of 05/21/18  9:01 AM. Always use your most recent med list.          amLODipine 10 MG tablet Commonly known as:  NORVASC Take 1 tablet (10 mg total) by mouth daily.   aspirin 81 MG chewable tablet Chew 1 tablet (81 mg total) by mouth daily.   ezetimibe 10 MG tablet Commonly known as:  ZETIA Take 1 tablet (10 mg total) by mouth daily.   famotidine 20 MG tablet Commonly known as:  PEPCID Take 1 tablet (20 mg total) by mouth 2 (two) times daily.   methimazole 5 MG tablet Commonly known as:  TAPAZOLE TAKE 1 TABLET BY MOUTH THREE TIMES  A WEEK   methocarbamol 500 MG tablet Commonly known as:  ROBAXIN Take 1 tablet (500 mg total) by mouth every 8 (eight) hours as needed for muscle spasms.   metoCLOPramide 5 MG tablet Commonly known as:   REGLAN Take 1 tablet (5 mg total) by mouth 3 (three) times daily before meals for 6 days.   nystatin powder Commonly known as:  MYCOSTATIN/NYSTOP Apply topically 2 (two) times daily.   ondansetron 4 MG tablet Commonly known as:  ZOFRAN Take 1 tab by mouth every 6 hours as needed for nausea.   polyethylene glycol packet Commonly known as:  MIRALAX / GLYCOLAX Take 17 g by mouth 2 (two) times daily.   potassium chloride SA 20 MEQ tablet Commonly known as:  K-DUR,KLOR-CON Take 1 tablet (20 mEq total) by mouth daily.   rosuvastatin 40 MG tablet Commonly known as:  CRESTOR Take 1 tablet (40 mg total) by mouth every other day.   SUMAtriptan 50 MG tablet Commonly known as:  IMITREX Take 50 mg by mouth every 2 (two) hours as needed for migraine. May repeat in 2 hours if headache persists or recurs.   traMADol 50 MG tablet Commonly known as:  ULTRAM Take 1 tablet (50 mg total) by mouth every 8 (eight) hours as needed.   traZODone 50 MG tablet Commonly known as:  DESYREL Take 0.5-1 tablets (25-50 mg total) by mouth at  bedtime as needed for sleep.       Allergies:  Allergies  Allergen Reactions  . Ace Inhibitors     REACTION: chronic cough  . Celebrex [Celecoxib] Other (See Comments)    "makes me bleed"  . Diovan [Valsartan]     angioedema    Past Medical History, Surgical history, Social history, and Family History were reviewed and updated.  Review of Systems: Review of Systems  Constitutional: Positive for malaise/fatigue.  HENT: Negative.   Eyes: Negative.   Respiratory: Negative.   Cardiovascular: Negative.   Gastrointestinal: Negative.   Genitourinary: Negative.   Musculoskeletal: Negative.   Skin: Negative.   Neurological: Negative.   Endo/Heme/Allergies: Negative.   Psychiatric/Behavioral: The patient has insomnia.      Physical Exam:  height is 5\' 4"  (1.626 m) and weight is 220 lb 12 oz (100.1 kg). Her oral temperature is 97.9 F (36.6 C). Her blood  pressure is 138/79 and her pulse is 69. Her respiration is 17 and oxygen saturation is 100%.   Wt Readings from Last 3 Encounters:  05/21/18 220 lb 12 oz (100.1 kg)  05/12/18 222 lb (100.7 kg)  05/01/18 222 lb (100.7 kg)    Physical Exam  Constitutional: She is oriented to person, place, and time.  HENT:  Head: Normocephalic and atraumatic.  Mouth/Throat: Oropharynx is clear and moist.  Eyes: Pupils are equal, round, and reactive to light. EOM are normal.  Neck: Normal range of motion.  Cardiovascular: Normal rate, regular rhythm and normal heart sounds.  Pulmonary/Chest: Effort normal and breath sounds normal.  Abdominal: Soft. Bowel sounds are normal.  Musculoskeletal: Normal range of motion. She exhibits no edema, tenderness or deformity.  Lymphadenopathy:    She has no cervical adenopathy.  Neurological: She is alert and oriented to person, place, and time.  Skin: Skin is warm and dry. No rash noted. No erythema.  Psychiatric: She has a normal mood and affect. Her behavior is normal. Judgment and thought content normal.  Vitals reviewed.    Lab Results  Component Value Date   WBC 6.7 05/21/2018   HGB 12.0 05/21/2018   HCT 41.8 05/21/2018   MCV 76.4 (L) 05/21/2018   PLT 240 05/21/2018   Lab Results  Component Value Date   FERRITIN 686 (H) 05/01/2018   IRON 62 05/01/2018   TIBC 253 05/01/2018   UIBC 191 05/01/2018   IRONPCTSAT 25 05/01/2018   Lab Results  Component Value Date   RETICCTPCT 1.1 05/21/2018   RBC 5.47 (H) 05/21/2018   RBC 5.47 (H) 05/21/2018   RETICCTABS 48.5 04/08/2015   No results found for: Nils Pyle [facility] Lab Results  Component Value Date   IGA 305 05/04/2009   Lab Results  Component Value Date   TOTALPROTELP 7.2 05/04/2009   ALBUMINELP 49.1 (L) 05/04/2009   A1GS 8.3 (H) 05/04/2009   A2GS 14.1 (H) 05/04/2009   BETS 6.6 05/04/2009   GAMS 16.4 05/04/2009   MSPIKE NOT DET 05/04/2009     Chemistry      Component  Value Date/Time   NA 141 05/01/2018 1233   NA 140 08/26/2017 1112   NA 139 12/14/2016 0849   K 4.0 05/01/2018 1233   K 3.7 12/14/2016 0849   CL 106 05/01/2018 1233   CL 99 08/16/2015 1055   CO2 29 05/01/2018 1233   CO2 25 12/14/2016 0849   BUN 14 05/01/2018 1233   BUN 15 08/26/2017 1112   BUN 11.4 12/14/2016 0849   CREATININE  0.77 05/01/2018 1233   CREATININE 0.9 12/14/2016 0849      Component Value Date/Time   CALCIUM 9.3 05/01/2018 1233   CALCIUM 9.6 12/14/2016 0849   ALKPHOS 126 05/01/2018 1233   ALKPHOS 135 12/14/2016 0849   AST 18 05/01/2018 1233   AST 22 12/14/2016 0849   ALT 22 05/01/2018 1233   ALT 32 12/14/2016 0849   BILITOT 0.3 05/01/2018 1233   BILITOT 0.29 12/14/2016 0849      Impression and Plan: [name] is a very pleasant 57 yo African American female with history of iron deficiency anemia.   I am not sure what we can need to try to help her tiredness.  She does not have any problems with anemia.  Her hemoglobin is quite good for that matter.  Again, she does have thyroid issues.  I do not know she needs to see her endocrinologist.  I will plan to get her back in the springtime.  I think we can go through the wintertime and not had to see her unless she has any issues.  As always, we had a good prayer session.  She does have a very strong faith.    [name], MD 11/27/20199:01 AM

## 2018-05-21 NOTE — Telephone Encounter (Signed)
-----   Message from [name], MD sent at 05/21/2018 12:24 PM EST ----- Call - iron level is ok!!  [name]

## 2018-05-21 NOTE — Telephone Encounter (Signed)
As noted below by Dr. Marin Olp, I tried calling patient regarding her iron level, but her voice mail is full and unable to leave a message.

## 2018-05-23 LAB — ERYTHROPOIETIN: Erythropoietin: 4.7 m[IU]/mL (ref 2.6–18.5)

## 2018-05-26 ENCOUNTER — Ambulatory Visit (INDEPENDENT_AMBULATORY_CARE_PROVIDER_SITE_OTHER): Payer: Medicare PPO | Admitting: Family

## 2018-05-26 ENCOUNTER — Ambulatory Visit (HOSPITAL_BASED_OUTPATIENT_CLINIC_OR_DEPARTMENT_OTHER)
Admission: RE | Admit: 2018-05-26 | Discharge: 2018-05-26 | Disposition: A | Discharge status: Home / Self Care | Payer: Medicare PPO | Source: Ambulatory Visit | Attending: Family | Admitting: Family

## 2018-05-26 ENCOUNTER — Encounter: Payer: Self-pay | Admitting: Family

## 2018-05-26 VITALS — BP 136/81 | HR 69 | Temp 98.5°F | Resp 16 | Ht 64.0 in | Wt 223.0 lb

## 2018-05-26 DIAGNOSIS — M545 Low back pain, unspecified: Secondary | ICD-10-CM

## 2018-05-26 MED ORDER — METHYLPREDNISOLONE 4 MG PO TBPK: ORAL_TABLET | ORAL | 0 refills | Status: DC

## 2018-05-26 MED ORDER — METHOCARBAMOL 500 MG PO TABS: 500.0000 mg | ORAL_TABLET | Freq: Three times a day (TID) | ORAL | 0 refills | Status: DC | PRN

## 2018-05-26 NOTE — Patient Instructions (Signed)
Please begin medrol dose pak for back pain. You may use robaxin as needed for back spasms. Complete x-ray on the first floor. Call if new/worsening symptoms or if not improved in 1 week.

## 2018-05-26 NOTE — Progress Notes (Signed)
Subjective:    Patient ID: [name], female    DOB: 1961-05-18, 57 y.o.   MRN: [id number]  HPI  Patient is a 57 yr old female who presents today with chief complaint of low back pain.  Pain started yesterday after she bend down.  Heard crack and then immediately followed by pain.  Pain is in the mid low back and right lower back.  Non-radiating. Took aleve and tramadol without improvement in her pain. Denies LE weakness. Pain is 10/10 at rest and with movement.   Review of Systems    see HPI  Past Medical History:  Diagnosis Date  . Allergy    allergic rhinitis  . Arthritis   . Atypical chest pain   . Constipation   . Cyst, ovarian   . Diabetes mellitus, type II ([place])   . Endometriosis   . Esophageal stricture   . Gastroparesis   . GERD (gastroesophageal reflux disease)   . Heart murmur   . Hyperlipidemia   . Hypertension   . 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 Iron deficiency anemia   . Migraine headache   . Non-compliance   . Peptic ulcer disease   . Thyroid disease      Social History   Socioeconomic History  . Marital status: Widowed    Spouse name: Not on file  . Number of children: 2  . Years of education: Not on file  . Highest education level: Not on file  Occupational History  . Occupation: DISABILITY    Employer: UNEMPLOYED  Social Needs  . Financial resource strain: Not on file  . Food insecurity:    Worry: Not on file    Inability: Not on file  . Transportation needs:    Medical: Not on file    Non-medical: Not on file  Tobacco Use  . Smoking status: Former Smoker    Packs/day: 0.50    Years: 18.00    Pack years: 9.00    Types: Cigarettes    Start date: 07/29/1977    Last attempt to quit: 06/25/1994    Years since quitting: 23.9  . Smokeless tobacco: Never Used  . Tobacco comment: quit 19 years ago  Substance and Sexual Activity  . Alcohol use: Yes    Alcohol/week: 0.0  standard drinks    Comment: social drinker  . Drug use: No  . Sexual activity: Not Currently  Lifestyle  . Physical activity:    Days per week: Not on file    Minutes per session: Not on file  . Stress: Not on file  Relationships  . Social connections:    Talks on phone: Not on file    Gets together: Not on file    Attends religious service: Not on file    Active member of club or organization: Not on file    Attends meetings of clubs or organizations: Not on file    Relationship status: Not on file  . Intimate partner violence:    Fear of current or ex partner: Not on file    Emotionally abused: Not on file    Physically abused: Not on file    Forced sexual activity: Not on file  Other Topics Concern  . Not on file  Social History Narrative   Widowed   Has 2 grown children.  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   Former Smoker Quit  tobacco in 1996.  She was a pack a day smoker for approximately 10 years.   Alcohol use-yes: Social    Daily Caffeine Use:6 pack of pepsi daily     Illicit Drug Use - no    Patient does not get regular exercise.       Smoking Status:  quit    Past Surgical History:  Procedure Laterality Date  . ABDOMINAL EXPLORATION SURGERY     w/bso   . CARDIAC CATHETERIZATION  2009   mild non obstructive CAD  . CHOLECYSTECTOMY    . COLONOSCOPY    . KNEE SURGERY  2005    left knee  . LEFT HEART CATH AND CORONARY ANGIOGRAPHY N/A 07/17/2017   Procedure: LEFT HEART CATH AND CORONARY ANGIOGRAPHY;  Surgeon: Martinique, Peter M, MD;  Location: [facility];  Service: Cardiovascular;  Laterality: N/A;  . POLYPECTOMY    . TOTAL ABDOMINAL HYSTERECTOMY    . UPPER GASTROINTESTINAL ENDOSCOPY      Family History  Problem Relation Age of Onset  . Hypertension Mother   . Rheum arthritis Mother   . Hypertension Father   . Diabetes Father   . Prostate cancer Father   . Kidney disease Father   . Diabetes Sister     . Fibromyalgia Sister   . Diabetes Maternal Grandmother   . Lung cancer Maternal Grandfather   . Arthritis Brother   . Migraines Brother   . CAD Brother   . Fibromyalgia Sister   . Migraines Sister   . Colon cancer Neg Hx   . Thyroid disease Neg Hx   . Colon polyps Neg Hx     Allergies  Allergen Reactions  . Ace Inhibitors     REACTION: chronic cough  . Celebrex [Celecoxib] Other (See Comments)    "makes me bleed"  . Diovan [Valsartan]     angioedema    Current Outpatient Medications on File Prior to Visit  Medication Sig Dispense Refill  . amLODipine (NORVASC) 10 MG tablet Take 1 tablet (10 mg total) by mouth daily. 30 tablet 3  . aspirin 81 MG chewable tablet Chew 1 tablet (81 mg total) by mouth daily.    Marland Kitchen ezetimibe (ZETIA) 10 MG tablet Take 1 tablet (10 mg total) by mouth daily. 90 tablet 3  . famotidine (PEPCID) 20 MG tablet Take 1 tablet (20 mg total) by mouth 2 (two) times daily. 60 tablet 11  . methimazole (TAPAZOLE) 5 MG tablet TAKE 1 TABLET BY MOUTH THREE TIMES  A WEEK (Patient taking differently: Take 5 mg by mouth 3 (three) times a week. Monday, Wednesday, Friday) 12 tablet 5  . methocarbamol (ROBAXIN) 500 MG tablet Take 1 tablet (500 mg total) by mouth every 8 (eight) hours as needed for muscle spasms. 20 tablet 0  . metoCLOPramide (REGLAN) 5 MG tablet Take 1 tablet (5 mg total) by mouth 3 (three) times daily before meals for 6 days. 18 tablet 0  . nystatin (MYCOSTATIN/NYSTOP) powder Apply topically 2 (two) times daily. 60 g 0  . ondansetron (ZOFRAN) 4 MG tablet Take 1 tab by mouth every 6 hours as needed for nausea. 100 tablet 3  . polyethylene glycol (MIRALAX / GLYCOLAX) packet Take 17 g by mouth 2 (two) times daily. 14 each 0  . potassium chloride SA (K-DUR,KLOR-CON) 20 MEQ tablet Take 1 tablet (20 mEq total) by mouth daily. 30 tablet 5  . rosuvastatin (CRESTOR) 40 MG tablet Take 1 tablet (40 mg total) by mouth every  other day. 45 tablet 1  . SUMAtriptan  (IMITREX) 50 MG tablet Take 50 mg by mouth every 2 (two) hours as needed for migraine. May repeat in 2 hours if headache persists or recurs.    . traMADol (ULTRAM) 50 MG tablet Take 1 tablet (50 mg total) by mouth every 8 (eight) hours as needed. 30 tablet 0  . traZODone (DESYREL) 50 MG tablet Take 0.5-1 tablets (25-50 mg total) by mouth at bedtime as needed for sleep. 30 tablet 3  . [DISCONTINUED] atorvastatin (LIPITOR) 40 MG tablet Take 1 tablet (40 mg total) by mouth daily. 30 tablet 3   Current Facility-Administered Medications on File Prior to Visit  Medication Dose Route Frequency Provider Last Rate Last Dose  . 0.9 %  sodium chloride infusion  500 mL Intravenous Once Armbruster, Carlota Raspberry, MD        BP 136/81 (BP Location: Left Arm, Patient Position: Sitting, Cuff Size: Small)   Pulse 69   Temp 98.5 F (36.9 C) (Oral)   Resp 16   Ht 5\' 4"  (1.626 m)   Wt 223 lb (101.2 kg)   SpO2 100%   BMI 38.28 kg/m     Objective:   Physical Exam  Constitutional: She appears well-developed and well-nourished.  Cardiovascular: Normal rate, regular rhythm and normal heart sounds.  No murmur heard. Pulmonary/Chest: Effort normal and breath sounds normal. No respiratory distress. She has no wheezes.  Musculoskeletal: She exhibits no edema.       Cervical back: She exhibits no tenderness.       Thoracic back: She exhibits no tenderness.       Lumbar back: She exhibits tenderness.  Neurological:  Reflex Scores:      Patellar reflexes are 2+ on the right side and 2+ on the left side. Bilateral LE strength is 5/5  Psychiatric: She has a normal mood and affect. Her behavior is normal. Judgment and thought content normal.          Assessment & Plan:  Low back pain- new. Will obtain x-ray of lumbar spine, rx with medrol dose pak and prn robaxin. She is advised to call if new/worsening symptoms or if symptoms are not improved in 1 week.

## 2018-05-29 ENCOUNTER — Ambulatory Visit: Payer: Medicare PPO | Admitting: Hematology & Oncology

## 2018-05-29 ENCOUNTER — Other Ambulatory Visit: Payer: Medicare PPO

## 2018-05-30 ENCOUNTER — Ambulatory Visit (INDEPENDENT_AMBULATORY_CARE_PROVIDER_SITE_OTHER): Payer: Medicare PPO | Admitting: Endocrinology

## 2018-05-30 ENCOUNTER — Encounter: Payer: Self-pay | Admitting: Endocrinology

## 2018-05-30 VITALS — BP 140/78 | HR 73 | Ht 64.0 in | Wt 224.8 lb

## 2018-05-30 DIAGNOSIS — E0590 Thyrotoxicosis, unspecified without thyrotoxic crisis or storm: Secondary | ICD-10-CM

## 2018-05-30 LAB — TSH: TSH: 0.12 u[IU]/mL — ABNORMAL LOW (ref 0.35–4.50)

## 2018-05-30 LAB — T4, FREE: Free T4: 0.75 ng/dL (ref 0.60–1.60)

## 2018-05-30 MED ORDER — METHIMAZOLE 5 MG PO TABS: 5.0000 mg | ORAL_TABLET | Freq: Every day | ORAL | 3 refills | Status: DC

## 2018-05-30 NOTE — Patient Instructions (Addendum)
blood tests are requested for you today.  We'll let you know about the results. If ever you have fever while taking methimazole, stop it and call us, even if the reason is obvious, because of the risk of a rare side-effect. Please come back for a follow-up appointment in 6 months.   

## 2018-05-30 NOTE — Progress Notes (Signed)
Subjective:    Patient ID: [name], female    DOB: [birth year], 57 y.o.   MRN: [id number]  HPI Pt returns for f/u of hyperthyroidism (dx'ed 2012; she chose tapazole rx; she has never had thyroid imaging).  pt states she feels no different, and well in general.   Past Medical History:  Diagnosis Date  . Allergy    allergic rhinitis  . Arthritis   . Atypical chest pain   . Constipation   . Cyst, ovarian   . Diabetes mellitus, type II ([place])   . Endometriosis   . Esophageal stricture   . Gastroparesis   . GERD (gastroesophageal reflux disease)   . Heart murmur   . Hyperlipidemia   . Hypertension   . 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 Iron deficiency anemia   . Migraine headache   . Non-compliance   . Peptic ulcer disease   . Thyroid disease     Past Surgical History:  Procedure Laterality Date  . ABDOMINAL EXPLORATION SURGERY     w/bso   . CARDIAC CATHETERIZATION  2009   mild non obstructive CAD  . CHOLECYSTECTOMY    . COLONOSCOPY    . KNEE SURGERY  2005    left knee  . LEFT HEART CATH AND CORONARY ANGIOGRAPHY N/A 07/17/2017   Procedure: LEFT HEART CATH AND CORONARY ANGIOGRAPHY;  Surgeon: Martinique, Peter M, MD;  Location: [facility];  Service: Cardiovascular;  Laterality: N/A;  . POLYPECTOMY    . TOTAL ABDOMINAL HYSTERECTOMY    . UPPER GASTROINTESTINAL ENDOSCOPY      Social History   Socioeconomic History  . Marital status: Widowed    Spouse name: Not on file  . Number of children: 2  . Years of education: Not on file  . Highest education level: Not on file  Occupational History  . Occupation: DISABILITY    Employer: UNEMPLOYED  Social Needs  . Financial resource strain: Not on file  . Food insecurity:    Worry: Not on file    Inability: Not on file  . Transportation needs:    Medical: Not on file    Non-medical: Not on file  Tobacco Use  . Smoking status: Former Smoker   Packs/day: 0.50    Years: 18.00    Pack years: 9.00    Types: Cigarettes    Start date: 07/29/1977    Last attempt to quit: 06/25/1994    Years since quitting: 23.9  . Smokeless tobacco: Never Used  . Tobacco comment: quit 19 years ago  Substance and Sexual Activity  . Alcohol use: Yes    Alcohol/week: 0.0 standard drinks    Comment: social drinker  . Drug use: No  . Sexual activity: Not Currently  Lifestyle  . Physical activity:    Days per week: Not on file    Minutes per session: Not on file  . Stress: Not on file  Relationships  . Social connections:    Talks on phone: Not on file    Gets together: Not on file    Attends religious service: Not on file    Active member of club or organization: Not on file    Attends meetings of clubs or organizations: Not on file    Relationship status: Not on file  . Intimate partner violence:    Fear of current or ex partner: Not on file    Emotionally abused: Not on file  Physically abused: Not on file    Forced sexual activity: Not on file  Other Topics Concern  . Not on file  Social History Narrative   Widowed   Has 2 grown children.  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   Former Smoker Quit tobacco in 1996.  She was a pack a day smoker for approximately 10 years.   Alcohol use-yes: Social    Daily Caffeine Use:6 pack of pepsi daily     Illicit Drug Use - no    Patient does not get regular exercise.       Smoking Status:  quit    Current Outpatient Medications on File Prior to Visit  Medication Sig Dispense Refill  . amLODipine (NORVASC) 10 MG tablet Take 1 tablet (10 mg total) by mouth daily. 30 tablet 3  . aspirin 81 MG chewable tablet Chew 1 tablet (81 mg total) by mouth daily.    Marland Kitchen ezetimibe (ZETIA) 10 MG tablet Take 1 tablet (10 mg total) by mouth daily. 90 tablet 3  . famotidine (PEPCID) 20 MG tablet Take 1 tablet (20 mg total) by mouth 2 (two) times daily. 60 tablet 11  .  methocarbamol (ROBAXIN) 500 MG tablet Take 1 tablet (500 mg total) by mouth every 8 (eight) hours as needed for muscle spasms. 20 tablet 0  . methylPREDNISolone (MEDROL DOSEPAK) 4 MG TBPK tablet Please take per package instructions 21 tablet 0  . nystatin (MYCOSTATIN/NYSTOP) powder Apply topically 2 (two) times daily. 60 g 0  . ondansetron (ZOFRAN) 4 MG tablet Take 1 tab by mouth every 6 hours as needed for nausea. 100 tablet 3  . polyethylene glycol (MIRALAX / GLYCOLAX) packet Take 17 g by mouth 2 (two) times daily. 14 each 0  . potassium chloride SA (K-DUR,KLOR-CON) 20 MEQ tablet Take 1 tablet (20 mEq total) by mouth daily. 30 tablet 5  . rosuvastatin (CRESTOR) 40 MG tablet Take 1 tablet (40 mg total) by mouth every other day. 45 tablet 1  . SUMAtriptan (IMITREX) 50 MG tablet Take 50 mg by mouth every 2 (two) hours as needed for migraine. May repeat in 2 hours if headache persists or recurs.    . traMADol (ULTRAM) 50 MG tablet Take 1 tablet (50 mg total) by mouth every 8 (eight) hours as needed. 30 tablet 0  . traZODone (DESYREL) 50 MG tablet Take 0.5-1 tablets (25-50 mg total) by mouth at bedtime as needed for sleep. 30 tablet 3  . metoCLOPramide (REGLAN) 5 MG tablet Take 1 tablet (5 mg total) by mouth 3 (three) times daily before meals for 6 days. 18 tablet 0  . [DISCONTINUED] atorvastatin (LIPITOR) 40 MG tablet Take 1 tablet (40 mg total) by mouth daily. 30 tablet 3   Current Facility-Administered Medications on File Prior to Visit  Medication Dose Route Frequency Provider Last Rate Last Dose  . 0.9 %  sodium chloride infusion  500 mL Intravenous Once Armbruster, Carlota Raspberry, MD        Allergies  Allergen Reactions  . Ace Inhibitors     REACTION: chronic cough  . Celebrex [Celecoxib] Other (See Comments)    "makes me bleed"  . Diovan [Valsartan]     angioedema    Family History  Problem Relation Age of Onset  . Hypertension Mother   . Rheum arthritis Mother   . Hypertension Father     . Diabetes Father   . Prostate cancer Father   .  Kidney disease Father   . Diabetes Sister   . Fibromyalgia Sister   . Diabetes Maternal Grandmother   . Lung cancer Maternal Grandfather   . Arthritis Brother   . Migraines Brother   . CAD Brother   . Fibromyalgia Sister   . Migraines Sister   . Colon cancer Neg Hx   . Thyroid disease Neg Hx   . Colon polyps Neg Hx     BP 140/78 (BP Location: Left Arm, Patient Position: Sitting, Cuff Size: Large)   Pulse 73   Ht 5\' 4"  (1.626 m)   Wt 224 lb 12.8 oz (102 kg)   SpO2 96%   BMI 38.59 kg/m    Review of Systems Denies fever.      Objective:   Physical Exam VITAL SIGNS:  See vs page.   GENERAL: no distress.   NECK: There is no palpable thyroid enlargement.  No thyroid nodule is palpable.  No palpable lymphadenopathy at the anterior neck.     Lab Results  Component Value Date   TSH 0.12 (L) 05/30/2018   T4TOTAL 12.3 11/28/2011      Assessment & Plan:  Hyperthyroidism: he needs increased rx.  I have sent a prescription to your pharmacy, to increase tapazole  Patient Instructions  blood tests are requested for you today.  We'll let you know about the results.   If ever you have fever while taking methimazole, stop it and call us, even if the reason is obvious, because of the risk of a rare side-effect. Please come back for a follow-up appointment in 6 months.

## 2018-06-16 ENCOUNTER — Telehealth: Payer: Self-pay

## 2018-06-16 ENCOUNTER — Other Ambulatory Visit: Payer: Self-pay

## 2018-06-16 DIAGNOSIS — M545 Low back pain, unspecified: Secondary | ICD-10-CM

## 2018-06-16 NOTE — Telephone Encounter (Signed)
Yes, please place referral. Thanks

## 2018-06-16 NOTE — Telephone Encounter (Signed)
Referral entered for [facility] Dr. Clydell Hakim.

## 2018-06-16 NOTE — Telephone Encounter (Signed)
Started in error

## 2018-06-16 NOTE — Telephone Encounter (Signed)
Patient is still complaining of back pain, she will like to be referred to her back specialist at [facility] Dr. Clydell Hakim. Please advise if ok.

## 2018-06-20 ENCOUNTER — Other Ambulatory Visit (HOSPITAL_BASED_OUTPATIENT_CLINIC_OR_DEPARTMENT_OTHER): Payer: Self-pay | Admitting: Family

## 2018-06-20 ENCOUNTER — Ambulatory Visit: Payer: Medicare PPO | Admitting: Family Medicine

## 2018-06-20 ENCOUNTER — Encounter: Payer: Self-pay | Admitting: Family Medicine

## 2018-06-20 ENCOUNTER — Ambulatory Visit (INDEPENDENT_AMBULATORY_CARE_PROVIDER_SITE_OTHER): Payer: Medicare PPO | Admitting: Family Medicine

## 2018-06-20 VITALS — BP 124/74 | HR 56 | Temp 98.2°F | Ht 64.0 in | Wt 225.2 lb

## 2018-06-20 DIAGNOSIS — M545 Low back pain, unspecified: Secondary | ICD-10-CM

## 2018-06-20 DIAGNOSIS — M533 Sacrococcygeal disorders, not elsewhere classified: Secondary | ICD-10-CM | POA: Diagnosis not present

## 2018-06-20 DIAGNOSIS — Z1231 Encounter for screening mammogram for malignant neoplasm of breast: Secondary | ICD-10-CM

## 2018-06-20 MED ORDER — FLUTICASONE PROPIONATE 50 MCG/ACT NA SUSP: 2.0000 | Freq: Every day | NASAL | 1 refills | Status: DC

## 2018-06-20 MED ORDER — MELOXICAM 15 MG PO TABS: 15.0000 mg | ORAL_TABLET | Freq: Every day | ORAL | 0 refills | Status: DC

## 2018-06-20 MED ORDER — METHYLPREDNISOLONE ACETATE 40 MG/ML IJ SUSP
40.0000 mg | Freq: Once | INTRAMUSCULAR | Status: AC
  Administered 2018-06-20: 40 mg via INTRA_ARTICULAR

## 2018-06-20 NOTE — Progress Notes (Signed)
Musculoskeletal Exam  Patient: [name] DOB: 1960-09-25  DOS: 06/20/2018  SUBJECTIVE:  Chief Complaint:   Chief Complaint  Patient presents with  . Back Pain  . Ear Fullness    [name] is a 57 y.o.  female for evaluation and treatment of his back pain.   Onset:  4 weeks ago. Bent over and felt a pop.  Location: middle/lower Character:  aching and sharp  Progression of issue:  is unchanged Associated symptoms: none Denies bowel/bladder incontinence or weakness Treatment: to date has been ice, OTC NSAIDS, oral steroids, muscle relaxers and heat.   Neurovascular symptoms: no  ROS: Musculoskeletal/Extremities: +back pain Neurologic: no numbness, tingling no weakness   Past Medical History:  Diagnosis Date  . Allergy    allergic rhinitis  . Arthritis   . Atypical chest pain   . Constipation   . Cyst, ovarian   . Diabetes mellitus, type II ([place])   . Endometriosis   . Esophageal stricture   . Gastroparesis   . GERD (gastroesophageal reflux disease)   . Heart murmur   . Hyperlipidemia   . Hypertension   . 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 Iron deficiency anemia   . Migraine headache   . Non-compliance   . Peptic ulcer disease   . Thyroid disease     Objective:  VITAL SIGNS: BP 124/74 (BP Location: Left Arm, Patient Position: Sitting, Cuff Size: Large)   Pulse (!) 56   Temp 98.2 F (36.8 C) (Oral)   Ht 5\' 4"  (1.626 m)   Wt 225 lb 4 oz (102.2 kg)   SpO2 98%   BMI 38.66 kg/m  Constitutional: Well formed, well developed. No acute distress. HENT: Normocephalic, atraumatic.  Thorax & Lungs:  No accessory muscle use Extremities: No clubbing. No cyanosis. No edema.  Skin: Warm. Dry. No erythema. No rash.  Musculoskeletal: low back.   Tenderness to palpation: yes, R SI jt and b/l parasp msc in lumbar region Deformity: no Ecchymosis: no Straight leg test: negative for Poor hamstring  flexibility b/l. Neurologic: Normal sensory function. No focal deficits noted. DTR's equal and symmetry in LE's. No clonus. Psychiatric: Normal mood. Age appropriate judgment and insight. Alert & oriented x 3.    Procedure note; SI joint injection Informed consent obtained. The PSIS's were palpated and demarcated with an otoscope speculum tip just medial to the side of interest. The area was cleaned with alcohol. Ethyl chloride spray used for topical anesthesia.  The joint was entered and 40 mg Depomedrol with 2 mL of 1% lidocaine was injected. The area was then bandaged. There were no complications noted.  The patient tolerated the procedure well.   Assessment:  Bilateral low back pain without sciatica, unspecified chronicity - Plan: meloxicam (MOBIC) 15 MG tablet  Sacroiliac joint pain - Plan: PR DRAIN/INJECT LARGE JOINT/BURSA  Plan: Orders as above. Stretches/exercises, heat, ice, Tylenol, rx NSAID. Had some ear fullness, ear unremarkable, trial INCS.  SI injection for pain.  F/u prn. The patient voiced understanding and agreement to the plan.   Stephens, DO 06/20/18  10:19 AM

## 2018-06-20 NOTE — Progress Notes (Signed)
Pre visit review using our clinic review tool, if applicable. No additional management support is needed unless otherwise documented below in the visit note. 

## 2018-06-20 NOTE — Addendum Note (Signed)
Addended by: Sharon Seller B on: 06/20/2018 10:41 AM   Modules accepted: Orders

## 2018-06-20 NOTE — Patient Instructions (Signed)
Heat (pad or rice pillow in microwave) over affected area, 10-15 minutes twice daily.   OK to take Tylenol 1000 mg (2 extra strength tabs) or 975 mg (3 regular strength tabs) every 6 hours as needed.  EXERCISES  RANGE OF MOTION (ROM) AND STRETCHING EXERCISES - Low Back Pain Most people with lower back pain will find that their symptoms get worse with excessive bending forward (flexion) or arching at the lower back (extension). The exercises that will help resolve your symptoms will focus on the opposite motion.  If you have pain, numbness or tingling which travels down into your buttocks, leg or foot, the goal of the therapy is for these symptoms to move closer to your back and eventually resolve. Sometimes, these leg symptoms will get better, but your lower back pain may worsen. This is often an indication of progress in your rehabilitation. Be very alert to any changes in your symptoms and the activities in which you participated in the 24 hours prior to the change. Sharing this information with your caregiver will allow him or her to most efficiently treat your condition. These exercises may help you when beginning to rehabilitate your injury. Your symptoms may resolve with or without further involvement from your physician, physical therapist or athletic trainer. While completing these exercises, remember:   Restoring tissue flexibility helps normal motion to return to the joints. This allows healthier, less painful movement and activity.  An effective stretch should be held for at least 30 seconds.  A stretch should never be painful. You should only feel a gentle lengthening or release in the stretched tissue. FLEXION RANGE OF MOTION AND STRETCHING EXERCISES:  STRETCH - Flexion, Single Knee to Chest   Lie on a firm bed or floor with both legs extended in front of you.  Keeping one leg in contact with the floor, bring your opposite knee to your chest. Hold your leg in place by either  grabbing behind your thigh or at your knee.  Pull until you feel a gentle stretch in your low back. Hold 30 seconds.  Slowly release your grasp and repeat the exercise with the opposite side. Repeat 2 times. Complete this exercise 3 times per week.   STRETCH - Flexion, Double Knee to Chest  Lie on a firm bed or floor with both legs extended in front of you.  Keeping one leg in contact with the floor, bring your opposite knee to your chest.  Tense your stomach muscles to support your back and then lift your other knee to your chest. Hold your legs in place by either grabbing behind your thighs or at your knees.  Pull both knees toward your chest until you feel a gentle stretch in your low back. Hold 30 seconds.  Tense your stomach muscles and slowly return one leg at a time to the floor. Repeat 2 times. Complete this exercise 3 times per week.   STRETCH - Low Trunk Rotation  Lie on a firm bed or floor. Keeping your legs in front of you, bend your knees so they are both pointed toward the ceiling and your feet are flat on the floor.  Extend your arms out to the side. This will stabilize your upper body by keeping your shoulders in contact with the floor.  Gently and slowly drop both knees together to one side until you feel a gentle stretch in your low back. Hold for 30 seconds.  Tense your stomach muscles to support your lower back as you   bring your knees back to the starting position. Repeat the exercise to the other side. Repeat 2 times. Complete this exercise at least 3 times per week.   EXTENSION RANGE OF MOTION AND FLEXIBILITY EXERCISES:  STRETCH - Extension, Prone on Elbows   Lie on your stomach on the floor, a bed will be too soft. Place your palms about shoulder width apart and at the height of your head.  Place your elbows under your shoulders. If this is too painful, stack pillows under your chest.  Allow your body to relax so that your hips drop lower and make contact  more completely with the floor.  Hold this position for 30 seconds.  Slowly return to lying flat on the floor. Repeat 2 times. Complete this exercise 3 times per week.   RANGE OF MOTION - Extension, Prone Press Ups  Lie on your stomach on the floor, a bed will be too soft. Place your palms about shoulder width apart and at the height of your head.  Keeping your back as relaxed as possible, slowly straighten your elbows while keeping your hips on the floor. You may adjust the placement of your hands to maximize your comfort. As you gain motion, your hands will come more underneath your shoulders.  Hold this position 30 seconds.  Slowly return to lying flat on the floor. Repeat 2 times. Complete this exercise 3 times per week.   RANGE OF MOTION- Quadruped, Neutral Spine   Assume a hands and knees position on a firm surface. Keep your hands under your shoulders and your knees under your hips. You may place padding under your knees for comfort.  Drop your head and point your tailbone toward the ground below you. This will round out your lower back like an angry cat. Hold this position for 30 seconds.  Slowly lift your head and release your tail bone so that your back sags into a large arch, like an old horse.  Hold this position for 30 seconds.  Repeat this until you feel limber in your low back.  Now, find your "sweet spot." This will be the most comfortable position somewhere between the two previous positions. This is your neutral spine. Once you have found this position, tense your stomach muscles to support your low back.  Hold this position for 30 seconds. Repeat 2 times. Complete this exercise 3 times per week.   STRENGTHENING EXERCISES - Low Back Sprain These exercises may help you when beginning to rehabilitate your injury. These exercises should be done near your "sweet spot." This is the neutral, low-back arch, somewhere between fully rounded and fully arched, that is your  least painful position. When performed in this safe range of motion, these exercises can be used for people who have either a flexion or extension based injury. These exercises may resolve your symptoms with or without further involvement from your physician, physical therapist or athletic trainer. While completing these exercises, remember:   Muscles can gain both the endurance and the strength needed for everyday activities through controlled exercises.  Complete these exercises as instructed by your physician, physical therapist or athletic trainer. Increase the resistance and repetitions only as guided.  You may experience muscle soreness or fatigue, but the pain or discomfort you are trying to eliminate should never worsen during these exercises. If this pain does worsen, stop and make certain you are following the directions exactly. If the pain is still present after adjustments, discontinue the exercise until you can discuss  the trouble with your caregiver.  STRENGTHENING - Deep Abdominals, Pelvic Tilt   Lie on a firm bed or floor. Keeping your legs in front of you, bend your knees so they are both pointed toward the ceiling and your feet are flat on the floor.  Tense your lower abdominal muscles to press your low back into the floor. This motion will rotate your pelvis so that your tail bone is scooping upwards rather than pointing at your feet or into the floor. With a gentle tension and even breathing, hold this position for 3 seconds. Repeat 2 times. Complete this exercise 3 times per week.   STRENGTHENING - Abdominals, Crunches   Lie on a firm bed or floor. Keeping your legs in front of you, bend your knees so they are both pointed toward the ceiling and your feet are flat on the floor. Cross your arms over your chest.  Slightly tip your chin down without bending your neck.  Tense your abdominals and slowly lift your trunk high enough to just clear your shoulder blades. Lifting  higher can put excessive stress on the lower back and does not further strengthen your abdominal muscles.  Control your return to the starting position. Repeat 2 times. Complete this exercise 3 times per week.   STRENGTHENING - Quadruped, Opposite UE/LE Lift   Assume a hands and knees position on a firm surface. Keep your hands under your shoulders and your knees under your hips. You may place padding under your knees for comfort.  Find your neutral spine and gently tense your abdominal muscles so that you can maintain this position. Your shoulders and hips should form a rectangle that is parallel with the floor and is not twisted.  Keeping your trunk steady, lift your right hand no higher than your shoulder and then your left leg no higher than your hip. Make sure you are not holding your breath. Hold this position for 30 seconds.  Continuing to keep your abdominal muscles tense and your back steady, slowly return to your starting position. Repeat with the opposite arm and leg. Repeat 2 times. Complete this exercise 3 times per week.   STRENGTHENING - Abdominals and Quadriceps, Straight Leg Raise   Lie on a firm bed or floor with both legs extended in front of you.  Keeping one leg in contact with the floor, bend the other knee so that your foot can rest flat on the floor.  Find your neutral spine, and tense your abdominal muscles to maintain your spinal position throughout the exercise.  Slowly lift your straight leg off the floor about 6 inches for a count of 3, making sure to not hold your breath.  Still keeping your neutral spine, slowly lower your leg all the way to the floor. Repeat this exercise with each leg 2 times. Complete this exercise 3 times per week.  POSTURE AND BODY MECHANICS CONSIDERATIONS - Low Back Sprain Keeping correct posture when sitting, standing or completing your activities will reduce the stress put on different body tissues, allowing injured tissues a  chance to heal and limiting painful experiences. The following are general guidelines for improved posture.  While reading these guidelines, remember:  The exercises prescribed by your provider will help you have the flexibility and strength to maintain correct postures.  The correct posture provides the best environment for your joints to work. All of your joints have less wear and tear when properly supported by a spine with good posture. This means you  will experience a healthier, less painful body.  Correct posture must be practiced with all of your activities, especially prolonged sitting and standing. Correct posture is as important when doing repetitive low-stress activities (typing) as it is when doing a single heavy-load activity (lifting).  RESTING POSITIONS Consider which positions are most painful for you when choosing a resting position. If you have pain with flexion-based activities (sitting, bending, stooping, squatting), choose a position that allows you to rest in a less flexed posture. You would want to avoid curling into a fetal position on your side. If your pain worsens with extension-based activities (prolonged standing, working overhead), avoid resting in an extended position such as sleeping on your stomach. Most people will find more comfort when they rest with their spine in a more neutral position, neither too rounded nor too arched. Lying on a non-sagging bed on your side with a pillow between your knees, or on your back with a pillow under your knees will often provide some relief. Keep in mind, being in any one position for a prolonged period of time, no matter how correct your posture, can still lead to stiffness.  PROPER SITTING POSTURE In order to minimize stress and discomfort on your spine, you must sit with correct posture. Sitting with good posture should be effortless for a healthy body. Returning to good posture is a gradual process. Many people can work toward this  most comfortably by using various supports until they have the flexibility and strength to maintain this posture on their own. When sitting with proper posture, your ears will fall over your shoulders and your shoulders will fall over your hips. You should use the back of the chair to support your upper back. Your lower back will be in a neutral position, just slightly arched. You may place a small pillow or folded towel at the base of your lower back for  support.  When working at a desk, create an environment that supports good, upright posture. Without extra support, muscles tire, which leads to excessive strain on joints and other tissues. Keep these recommendations in mind:  CHAIR:  A chair should be able to slide under your desk when your back makes contact with the back of the chair. This allows you to work closely.  The chair's height should allow your eyes to be level with the upper part of your monitor and your hands to be slightly lower than your elbows.  BODY POSITION  Your feet should make contact with the floor. If this is not possible, use a foot rest.  Keep your ears over your shoulders. This will reduce stress on your neck and low back.  INCORRECT SITTING POSTURES  If you are feeling tired and unable to assume a healthy sitting posture, do not slouch or slump. This puts excessive strain on your back tissues, causing more damage and pain. Healthier options include:  Using more support, like a lumbar pillow.  Switching tasks to something that requires you to be upright or walking.  Talking a brief walk.  Lying down to rest in a neutral-spine position.  PROLONGED STANDING WHILE SLIGHTLY LEANING FORWARD  When completing a task that requires you to lean forward while standing in one place for a long time, place either foot up on a stationary 2-4 inch high object to help maintain the best posture. When both feet are on the ground, the lower back tends to lose its slight inward  curve. If this curve flattens (or becomes too  large), then the back and your other joints will experience too much stress, tire more quickly, and can cause pain.  CORRECT STANDING POSTURES Proper standing posture should be assumed with all daily activities, even if they only take a few moments, like when brushing your teeth. As in sitting, your ears should fall over your shoulders and your shoulders should fall over your hips. You should keep a slight tension in your abdominal muscles to brace your spine. Your tailbone should point down to the ground, not behind your body, resulting in an over-extended swayback posture.   INCORRECT STANDING POSTURES  Common incorrect standing postures include a forward head, locked knees and/or an excessive swayback. WALKING Walk with an upright posture. Your ears, shoulders and hips should all line-up.  PROLONGED ACTIVITY IN A FLEXED POSITION When completing a task that requires you to bend forward at your waist or lean over a low surface, try to find a way to stabilize 3 out of 4 of your limbs. You can place a hand or elbow on your thigh or rest a knee on the surface you are reaching across. This will provide you more stability, so that your muscles do not tire as quickly. By keeping your knees relaxed, or slightly bent, you will also reduce stress across your lower back. CORRECT LIFTING TECHNIQUES  DO :  Assume a wide stance. This will provide you more stability and the opportunity to get as close as possible to the object which you are lifting.  Tense your abdominals to brace your spine. Bend at the knees and hips. Keeping your back locked in a neutral-spine position, lift using your leg muscles. Lift with your legs, keeping your back straight.  Test the weight of unknown objects before attempting to lift them.  Try to keep your elbows locked down at your sides in order get the best strength from your shoulders when carrying an object.     Always ask  for help when lifting heavy or awkward objects. INCORRECT LIFTING TECHNIQUES DO NOT:   Lock your knees when lifting, even if it is a small object.  Bend and twist. Pivot at your feet or move your feet when needing to change directions.  Assume that you can safely pick up even a paperclip without proper posture.

## 2018-06-23 ENCOUNTER — Ambulatory Visit: Payer: Medicare PPO | Admitting: Family

## 2018-07-04 NOTE — Telephone Encounter (Signed)
Done

## 2018-07-07 ENCOUNTER — Ambulatory Visit (HOSPITAL_BASED_OUTPATIENT_CLINIC_OR_DEPARTMENT_OTHER)
Admission: RE | Admit: 2018-07-07 | Discharge: 2018-07-07 | Disposition: A | Discharge status: Home / Self Care | Payer: Medicare HMO | Source: Ambulatory Visit | Attending: Family | Admitting: Family

## 2018-07-07 ENCOUNTER — Encounter (HOSPITAL_BASED_OUTPATIENT_CLINIC_OR_DEPARTMENT_OTHER): Payer: Self-pay

## 2018-07-07 DIAGNOSIS — Z1231 Encounter for screening mammogram for malignant neoplasm of breast: Secondary | ICD-10-CM | POA: Diagnosis not present

## 2018-08-04 ENCOUNTER — Telehealth: Payer: Self-pay | Admitting: Endocrinology

## 2018-08-04 ENCOUNTER — Ambulatory Visit: Payer: Medicare PPO | Admitting: Endocrinology

## 2018-08-04 NOTE — Telephone Encounter (Signed)
Patient no showed today's appt. Please advise on how to follow up. °A. No follow up necessary. °B. Follow up urgent. Contact patient immediately. °C. Follow up necessary. Contact patient and schedule visit in ___ days. °D. Follow up advised. Contact patient and schedule visit in ____weeks. ° °Would you like the NS fee to be applied to this visit? ° °

## 2018-08-04 NOTE — Telephone Encounter (Signed)
Please schedule f/u appt for next available appointment  

## 2018-08-04 NOTE — Telephone Encounter (Signed)
Scheduled patient's missed appointment for 08/06/18 at 7:30 a.m.

## 2018-08-06 ENCOUNTER — Encounter: Payer: Self-pay | Admitting: Endocrinology

## 2018-08-06 ENCOUNTER — Ambulatory Visit (INDEPENDENT_AMBULATORY_CARE_PROVIDER_SITE_OTHER): Payer: Medicare HMO | Admitting: Endocrinology

## 2018-08-06 VITALS — BP 136/70 | HR 69 | Ht 64.0 in | Wt 225.0 lb

## 2018-08-06 DIAGNOSIS — E0590 Thyrotoxicosis, unspecified without thyrotoxic crisis or storm: Secondary | ICD-10-CM

## 2018-08-06 LAB — TSH: TSH: 1.02 u[IU]/mL (ref 0.35–4.50)

## 2018-08-06 LAB — T4, FREE: Free T4: 0.85 ng/dL (ref 0.60–1.60)

## 2018-08-06 NOTE — Patient Instructions (Signed)
blood tests are requested for you today.  We'll let you know about the results. If ever you have fever while taking methimazole, stop it and call us, even if the reason is obvious, because of the risk of a rare side-effect.  Please come back for a follow-up appointment in 3 months.   

## 2018-08-06 NOTE — Progress Notes (Signed)
Subjective:    Patient ID: [name], female    DOB: 1960/09/09, 58 y.o.   MRN: [id number]  HPI Pt returns for f/u of hyperthyroidism (dx'ed 2012; she chose tapazole rx; she has never had thyroid imaging).  pt states she feels no different, and well in general.  Specifically, she denies palpitations and tremor.    Past Medical History:  Diagnosis Date  . Allergy    allergic rhinitis  . Arthritis   . Atypical chest pain   . Constipation   . Cyst, ovarian   . Diabetes mellitus, type II ([place])   . Endometriosis   . Esophageal stricture   . Gastroparesis   . GERD (gastroesophageal reflux disease)   . Heart murmur   . Hyperlipidemia   . Hypertension   . 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 Iron deficiency anemia   . Migraine headache   . Non-compliance   . Peptic ulcer disease   . Thyroid disease     Past Surgical History:  Procedure Laterality Date  . ABDOMINAL EXPLORATION SURGERY     w/bso   . BREAST BIOPSY    . CARDIAC CATHETERIZATION  2009   mild non obstructive CAD  . CHOLECYSTECTOMY    . COLONOSCOPY    . KNEE SURGERY  2005    left knee  . LEFT HEART CATH AND CORONARY ANGIOGRAPHY N/A 07/17/2017   Procedure: LEFT HEART CATH AND CORONARY ANGIOGRAPHY;  Surgeon: Martinique, Peter M, MD;  Location: [facility];  Service: Cardiovascular;  Laterality: N/A;  . POLYPECTOMY    . TOTAL ABDOMINAL HYSTERECTOMY    . UPPER GASTROINTESTINAL ENDOSCOPY      Social History   Socioeconomic History  . Marital status: Widowed    Spouse name: Not on file  . Number of children: 2  . Years of education: Not on file  . Highest education level: Not on file  Occupational History  . Occupation: DISABILITY    Employer: UNEMPLOYED  Social Needs  . Financial resource strain: Not on file  . Food insecurity:    Worry: Not on file    Inability: Not on file  . Transportation needs:    Medical: Not on file   Non-medical: Not on file  Tobacco Use  . Smoking status: Former Smoker    Packs/day: 0.50    Years: 18.00    Pack years: 9.00    Types: Cigarettes    Start date: 07/29/1977    Last attempt to quit: 06/25/1994    Years since quitting: 24.1  . Smokeless tobacco: Never Used  . Tobacco comment: quit 19 years ago  Substance and Sexual Activity  . Alcohol use: Yes    Alcohol/week: 0.0 standard drinks    Comment: social drinker  . Drug use: No  . Sexual activity: Not Currently  Lifestyle  . Physical activity:    Days per week: Not on file    Minutes per session: Not on file  . Stress: Not on file  Relationships  . Social connections:    Talks on phone: Not on file    Gets together: Not on file    Attends religious service: Not on file    Active member of club or organization: Not on file    Attends meetings of clubs or organizations: Not on file    Relationship status: Not on file  . Intimate partner violence:    Fear of current  or ex partner: Not on file    Emotionally abused: Not on file    Physically abused: Not on file    Forced sexual activity: Not on file  Other Topics Concern  . Not on file  Social History Narrative   Widowed   Has 2 grown children.  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   Former Smoker Quit tobacco in 1996.  She was a pack a day smoker for approximately 10 years.   Alcohol use-yes: Social    Daily Caffeine Use:6 pack of pepsi daily     Illicit Drug Use - no    Patient does not get regular exercise.       Smoking Status:  quit    Current Outpatient Medications on File Prior to Visit  Medication Sig Dispense Refill  . amLODipine (NORVASC) 10 MG tablet Take 1 tablet (10 mg total) by mouth daily. 30 tablet 3  . aspirin 81 MG chewable tablet Chew 1 tablet (81 mg total) by mouth daily.    Marland Kitchen ezetimibe (ZETIA) 10 MG tablet Take 1 tablet (10 mg total) by mouth daily. 90 tablet 3  . famotidine (PEPCID) 20 MG  tablet Take 1 tablet (20 mg total) by mouth 2 (two) times daily. 60 tablet 11  . fluticasone (FLONASE) 50 MCG/ACT nasal spray Place 2 sprays into both nostrils daily. 16 g 1  . meloxicam (MOBIC) 15 MG tablet Take 1 tablet (15 mg total) by mouth daily. 30 tablet 0  . methimazole (TAPAZOLE) 5 MG tablet Take 1 tablet (5 mg total) by mouth daily. 90 tablet 3  . nystatin (MYCOSTATIN/NYSTOP) powder Apply topically 2 (two) times daily. 60 g 0  . ondansetron (ZOFRAN) 4 MG tablet Take 1 tab by mouth every 6 hours as needed for nausea. 100 tablet 3  . polyethylene glycol (MIRALAX / GLYCOLAX) packet Take 17 g by mouth 2 (two) times daily. 14 each 0  . potassium chloride SA (K-DUR,KLOR-CON) 20 MEQ tablet Take 1 tablet (20 mEq total) by mouth daily. 30 tablet 5  . rosuvastatin (CRESTOR) 40 MG tablet Take 1 tablet (40 mg total) by mouth every other day. 45 tablet 1  . SUMAtriptan (IMITREX) 50 MG tablet Take 50 mg by mouth every 2 (two) hours as needed for migraine. May repeat in 2 hours if headache persists or recurs.    . traMADol (ULTRAM) 50 MG tablet Take 1 tablet (50 mg total) by mouth every 8 (eight) hours as needed. 30 tablet 0  . traZODone (DESYREL) 50 MG tablet Take 0.5-1 tablets (25-50 mg total) by mouth at bedtime as needed for sleep. 30 tablet 3  . [DISCONTINUED] atorvastatin (LIPITOR) 40 MG tablet Take 1 tablet (40 mg total) by mouth daily. 30 tablet 3   Current Facility-Administered Medications on File Prior to Visit  Medication Dose Route Frequency Provider Last Rate Last Dose  . 0.9 %  sodium chloride infusion  500 mL Intravenous Once Armbruster, Carlota Raspberry, MD        Allergies  Allergen Reactions  . Ace Inhibitors     REACTION: chronic cough  . Celebrex [Celecoxib] Other (See Comments)    "makes me bleed"  . Diovan [Valsartan]     angioedema    Family History  Problem Relation Age of Onset  . Hypertension Mother   . Rheum arthritis Mother   . Hypertension Father   . Diabetes Father    . Prostate cancer Father   .  Kidney disease Father   . Diabetes Sister   . Fibromyalgia Sister   . Diabetes Maternal Grandmother   . Lung cancer Maternal Grandfather   . Arthritis Brother   . Migraines Brother   . CAD Brother   . Fibromyalgia Sister   . Migraines Sister   . Colon cancer Neg Hx   . Thyroid disease Neg Hx   . Colon polyps Neg Hx     BP 136/70 (BP Location: Left Arm, Patient Position: Sitting, Cuff Size: Large)   Pulse 69   Ht 5\' 4"  (1.626 m)   Wt 225 lb (102.1 kg)   SpO2 96%   BMI 38.62 kg/m    Review of Systems Denies fever.     Objective:   Physical Exam VITAL SIGNS:  See vs page.   GENERAL: no distress.   NECK: There is no palpable thyroid enlargement.  No thyroid nodule is palpable.  No palpable lymphadenopathy at the anterior neck.    Lab Results  Component Value Date   TSH 1.02 08/06/2018   T4TOTAL 12.3 11/28/2011       Assessment & Plan:  Hyperthyroidism: well-controlled.   Fibromyalgia: she needs to maintain euthyroidism.    Patient Instructions  blood tests are requested for you today.  We'll let you know about the results.   If ever you have fever while taking methimazole, stop it and call us, even if the reason is obvious, because of the risk of a rare side-effect. Please come back for a follow-up appointment in 3 months.

## 2018-08-12 ENCOUNTER — Ambulatory Visit: Payer: Medicare PPO | Admitting: Family

## 2018-08-25 ENCOUNTER — Other Ambulatory Visit: Payer: Self-pay | Admitting: Family

## 2018-08-25 ENCOUNTER — Other Ambulatory Visit: Payer: Self-pay | Admitting: Internal Medicine

## 2018-08-29 ENCOUNTER — Ambulatory Visit: Payer: BC Managed Care – PPO | Admitting: Family

## 2018-09-01 ENCOUNTER — Ambulatory Visit (INDEPENDENT_AMBULATORY_CARE_PROVIDER_SITE_OTHER): Payer: Medicare HMO | Admitting: Family

## 2018-09-01 ENCOUNTER — Encounter: Payer: Self-pay | Admitting: Family

## 2018-09-01 ENCOUNTER — Ambulatory Visit (HOSPITAL_BASED_OUTPATIENT_CLINIC_OR_DEPARTMENT_OTHER)
Admission: RE | Admit: 2018-09-01 | Discharge: 2018-09-01 | Disposition: A | Discharge status: Home / Self Care | Payer: Medicare HMO | Source: Ambulatory Visit | Attending: Family | Admitting: Family

## 2018-09-01 VITALS — BP 126/73 | HR 66 | Temp 98.8°F | Resp 16 | Ht 64.0 in | Wt 228.0 lb

## 2018-09-01 DIAGNOSIS — R059 Cough, unspecified: Secondary | ICD-10-CM

## 2018-09-01 DIAGNOSIS — R05 Cough: Secondary | ICD-10-CM | POA: Diagnosis not present

## 2018-09-01 DIAGNOSIS — J40 Bronchitis, not specified as acute or chronic: Secondary | ICD-10-CM | POA: Diagnosis not present

## 2018-09-01 MED ORDER — ALBUTEROL SULFATE 108 (90 BASE) MCG/ACT IN AEPB: 2.0000 | INHALATION_SPRAY | Freq: Four times a day (QID) | RESPIRATORY_TRACT | 3 refills | Status: DC

## 2018-09-01 MED ORDER — PREDNISONE 10 MG PO TABS: ORAL_TABLET | ORAL | 0 refills | Status: DC

## 2018-09-01 MED ORDER — AZITHROMYCIN 250 MG PO TABS: ORAL_TABLET | ORAL | 0 refills | Status: DC

## 2018-09-01 NOTE — Progress Notes (Signed)
Subjective:    Patient ID: [name], female    DOB: 11-12-1960, 58 y.o.   MRN: [id number]  HPI  Patient is a 58 yr old female who presents today with chief complaint of chest congestion.  She also reports some right sided ear fullness.  Reports that symptoms started 2 weeks ago, "then went away and then came back." Reports + pain in the ear.  Cough is described as dry. Denies associated fever. Reports + fatigue.  Reports some wheezing, tightness in her breathing.    Review of Systems See HPI  Past Medical History:  Diagnosis Date  . Allergy    allergic rhinitis  . Arthritis   . Atypical chest pain   . Constipation   . Cyst, ovarian   . Diabetes mellitus, type II ([place])   . Endometriosis   . Esophageal stricture   . Gastroparesis   . GERD (gastroesophageal reflux disease)   . Heart murmur   . Hyperlipidemia   . Hypertension   . 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 Iron deficiency anemia   . Migraine headache   . Non-compliance   . Peptic ulcer disease   . Thyroid disease      Social History   Socioeconomic History  . Marital status: Widowed    Spouse name: Not on file  . Number of children: 2  . Years of education: Not on file  . Highest education level: Not on file  Occupational History  . Occupation: DISABILITY    Employer: UNEMPLOYED  Social Needs  . Financial resource strain: Not on file  . Food insecurity:    Worry: Not on file    Inability: Not on file  . Transportation needs:    Medical: Not on file    Non-medical: Not on file  Tobacco Use  . Smoking status: Former Smoker    Packs/day: 0.50    Years: 18.00    Pack years: 9.00    Types: Cigarettes    Start date: 07/29/1977    Last attempt to quit: 06/25/1994    Years since quitting: 24.2  . Smokeless tobacco: Never Used  . Tobacco comment: quit 19 years ago  Substance and Sexual Activity  . Alcohol use: Yes    Alcohol/week: 0.0  standard drinks    Comment: social drinker  . Drug use: No  . Sexual activity: Not Currently  Lifestyle  . Physical activity:    Days per week: Not on file    Minutes per session: Not on file  . Stress: Not on file  Relationships  . Social connections:    Talks on phone: Not on file    Gets together: Not on file    Attends religious service: Not on file    Active member of club or organization: Not on file    Attends meetings of clubs or organizations: Not on file    Relationship status: Not on file  . Intimate partner violence:    Fear of current or ex partner: Not on file    Emotionally abused: Not on file    Physically abused: Not on file    Forced sexual activity: Not on file  Other Topics Concern  . Not on file  Social History Narrative   Widowed   Has 2 grown children.  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   Former Smoker Quit tobacco in 1996.  She was a pack a day smoker for approximately 10 years.   Alcohol use-yes: Social    Daily Caffeine Use:6 pack of pepsi daily     Illicit Drug Use - no    Patient does not get regular exercise.       Smoking Status:  quit    Past Surgical History:  Procedure Laterality Date  . ABDOMINAL EXPLORATION SURGERY     w/bso   . BREAST BIOPSY    . CARDIAC CATHETERIZATION  2009   mild non obstructive CAD  . CHOLECYSTECTOMY    . COLONOSCOPY    . KNEE SURGERY  2005    left knee  . LEFT HEART CATH AND CORONARY ANGIOGRAPHY N/A 07/17/2017   Procedure: LEFT HEART CATH AND CORONARY ANGIOGRAPHY;  Surgeon: Martinique, Peter M, MD;  Location: [facility];  Service: Cardiovascular;  Laterality: N/A;  . POLYPECTOMY    . TOTAL ABDOMINAL HYSTERECTOMY    . UPPER GASTROINTESTINAL ENDOSCOPY      Family History  Problem Relation Age of Onset  . Hypertension Mother   . Rheum arthritis Mother   . Hypertension Father   . Diabetes Father   . Prostate cancer Father   . Kidney disease Father     . Diabetes Sister   . Fibromyalgia Sister   . Diabetes Maternal Grandmother   . Lung cancer Maternal Grandfather   . Arthritis Brother   . Migraines Brother   . CAD Brother   . Fibromyalgia Sister   . Migraines Sister   . Colon cancer Neg Hx   . Thyroid disease Neg Hx   . Colon polyps Neg Hx     Allergies  Allergen Reactions  . Ace Inhibitors     REACTION: chronic cough  . Celebrex [Celecoxib] Other (See Comments)    "makes me bleed"  . Diovan [Valsartan]     angioedema    Current Outpatient Medications on File Prior to Visit  Medication Sig Dispense Refill  . amLODipine (NORVASC) 10 MG tablet Take 1 tablet (10 mg total) by mouth daily. 30 tablet 3  . aspirin 81 MG chewable tablet Chew 1 tablet (81 mg total) by mouth daily.    Marland Kitchen ezetimibe (ZETIA) 10 MG tablet Take 1 tablet (10 mg total) by mouth daily. 90 tablet 3  . famotidine (PEPCID) 20 MG tablet Take 1 tablet (20 mg total) by mouth 2 (two) times daily. 60 tablet 11  . fluticasone (FLONASE) 50 MCG/ACT nasal spray Place 2 sprays into both nostrils daily. 16 g 1  . meloxicam (MOBIC) 15 MG tablet Take 1 tablet (15 mg total) by mouth daily. 30 tablet 0  . methimazole (TAPAZOLE) 5 MG tablet Take 1 tablet (5 mg total) by mouth daily. 90 tablet 3  . methocarbamol (ROBAXIN) 500 MG tablet TAKE 1 TABLET BY MOUTH EVERY 8 HOURS AS NEEDED FOR MUSCLE SPASMS 20 tablet 0  . nystatin (MYCOSTATIN/NYSTOP) powder Apply topically 2 (two) times daily. 60 g 0  . ondansetron (ZOFRAN) 4 MG tablet Take 1 tab by mouth every 6 hours as needed for nausea. 100 tablet 3  . polyethylene glycol (MIRALAX / GLYCOLAX) packet Take 17 g by mouth 2 (two) times daily. 14 each 0  . potassium chloride SA (K-DUR,KLOR-CON) 20 MEQ tablet Take 1 tablet (20 mEq total) by mouth daily. 30 tablet 5  . rosuvastatin (CRESTOR) 40 MG tablet Take 1 tablet (40 mg total) by mouth every other day. 45 tablet 1  . SUMAtriptan (IMITREX)  50 MG tablet Take 50 mg by mouth every 2 (two)  hours as needed for migraine. May repeat in 2 hours if headache persists or recurs.    . traMADol (ULTRAM) 50 MG tablet Take 1 tablet (50 mg total) by mouth every 8 (eight) hours as needed. 30 tablet 0  . traZODone (DESYREL) 50 MG tablet Take 0.5-1 tablets (25-50 mg total) by mouth at bedtime as needed for sleep. 30 tablet 3  . [DISCONTINUED] atorvastatin (LIPITOR) 40 MG tablet Take 1 tablet (40 mg total) by mouth daily. 30 tablet 3   Current Facility-Administered Medications on File Prior to Visit  Medication Dose Route Frequency Provider Last Rate Last Dose  . 0.9 %  sodium chloride infusion  500 mL Intravenous Once Armbruster, Carlota Raspberry, MD        BP 126/73 (BP Location: Left Arm, Patient Position: Sitting, Cuff Size: Large)   Pulse 66   Temp 98.8 F (37.1 C) (Oral)   Resp 16   Ht 5\' 4"  (1.626 m)   Wt 228 lb (103.4 kg)   SpO2 100%   BMI 39.14 kg/m       Objective:   Physical Exam Constitutional:      Appearance: She is well-developed.  HENT:     Right Ear: External ear normal. Tympanic membrane is retracted.     Left Ear: Ear canal and external ear normal.  Neck:     Musculoskeletal: Neck supple.     Thyroid: No thyromegaly.  Cardiovascular:     Rate and Rhythm: Normal rate and regular rhythm.     Heart sounds: Normal heart sounds. No murmur.  Pulmonary:     Effort: Pulmonary effort is normal. No respiratory distress.     Breath sounds: No decreased air movement. No decreased breath sounds or wheezing.  Skin:    General: Skin is warm and dry.  Neurological:     Mental Status: She is alert and oriented to person, place, and time.  Psychiatric:        Behavior: Behavior normal.        Thought Content: Thought content normal.        Judgment: Judgment normal.           Assessment & Plan:  Bronchitis- new.  Pt is advised as follows:  Please complete chest x-ray on the first floor. Begin azithromycin.  Begin prednisone taper. You may use albuterol 2 puffs  every 6 hrs as needed for shortness of breath or wheezing. Please call if symptoms worsen, if fever 101 or if not improved in 3-4 days.

## 2018-09-01 NOTE — Patient Instructions (Signed)
Please complete chest x-ray on the first floor. Begin azithromycin.  Begin prednisone taper. You may use albuterol 2 puffs every 6 hrs as needed for shortness of breath or wheezing. Please call if symptoms worsen, if fever 101 or if not improved in 3-4 days.

## 2018-09-12 ENCOUNTER — Other Ambulatory Visit: Payer: Self-pay | Admitting: Internal Medicine

## 2018-09-12 ENCOUNTER — Telehealth: Payer: Self-pay | Admitting: Family

## 2018-09-18 ENCOUNTER — Telehealth: Payer: Self-pay

## 2018-09-18 NOTE — Telephone Encounter (Signed)
Copied from [place] [id number]. Topic: Appointment Scheduling - Scheduling Inquiry for Clinic >> Sep 18, 2018 12:02 PM Rutherford Nail, [place] wrote: Reason for CRM: Patient calling and states that the office was wanting her to do a WebEx appointment tomorrow morning at 9:20. States that she has an appointment with Dr Marin Olp before this scheduled time and will still be in the area. Would like to come in for her appointment, if possible, or move her appointment time out. Please advise.

## 2018-09-18 NOTE — Telephone Encounter (Signed)
We are not seeing patients in the office.  Please reschedule webex for a more convenient time.

## 2018-09-18 NOTE — Telephone Encounter (Signed)
Appointment was moved to 2:20 tomorrow, patient aware.

## 2018-09-19 ENCOUNTER — Ambulatory Visit: Payer: BC Managed Care – PPO | Admitting: Family

## 2018-09-19 ENCOUNTER — Inpatient Hospital Stay: Payer: Medicare HMO

## 2018-09-19 ENCOUNTER — Other Ambulatory Visit: Payer: Self-pay

## 2018-09-19 ENCOUNTER — Inpatient Hospital Stay: Payer: Medicare HMO | Attending: Hematology & Oncology | Admitting: Hematology & Oncology

## 2018-09-19 ENCOUNTER — Ambulatory Visit (INDEPENDENT_AMBULATORY_CARE_PROVIDER_SITE_OTHER): Payer: Medicare HMO | Admitting: Family

## 2018-09-19 VITALS — BP 136/90 | HR 58 | Temp 97.9°F | Resp 17 | Wt 230.0 lb

## 2018-09-19 DIAGNOSIS — R5383 Other fatigue: Secondary | ICD-10-CM | POA: Diagnosis not present

## 2018-09-19 DIAGNOSIS — G43909 Migraine, unspecified, not intractable, without status migrainosus: Secondary | ICD-10-CM

## 2018-09-19 DIAGNOSIS — G4700 Insomnia, unspecified: Secondary | ICD-10-CM | POA: Diagnosis not present

## 2018-09-19 DIAGNOSIS — D500 Iron deficiency anemia secondary to blood loss (chronic): Secondary | ICD-10-CM

## 2018-09-19 DIAGNOSIS — R531 Weakness: Secondary | ICD-10-CM | POA: Insufficient documentation

## 2018-09-19 DIAGNOSIS — D509 Iron deficiency anemia, unspecified: Secondary | ICD-10-CM

## 2018-09-19 DIAGNOSIS — K921 Melena: Secondary | ICD-10-CM | POA: Diagnosis not present

## 2018-09-19 DIAGNOSIS — I10 Essential (primary) hypertension: Secondary | ICD-10-CM

## 2018-09-19 DIAGNOSIS — Z7982 Long term (current) use of aspirin: Secondary | ICD-10-CM | POA: Insufficient documentation

## 2018-09-19 DIAGNOSIS — Z79899 Other long term (current) drug therapy: Secondary | ICD-10-CM

## 2018-09-19 DIAGNOSIS — J309 Allergic rhinitis, unspecified: Secondary | ICD-10-CM

## 2018-09-19 LAB — CBC WITH DIFFERENTIAL (CANCER CENTER ONLY)
Abs Immature Granulocytes: 0.03 10*3/uL (ref 0.00–0.07)
Basophils Absolute: 0 10*3/uL (ref 0.0–0.1)
Basophils Relative: 0 %
Eosinophils Absolute: 0.1 10*3/uL (ref 0.0–0.5)
Eosinophils Relative: 1 %
HCT: 37 % (ref 36.0–46.0)
Hemoglobin: 10.9 g/dL — ABNORMAL LOW (ref 12.0–15.0)
Immature Granulocytes: 1 %
Lymphocytes Relative: 35 %
Lymphs Abs: 2.3 10*3/uL (ref 0.7–4.0)
MCH: 23 pg — ABNORMAL LOW (ref 26.0–34.0)
MCHC: 29.5 g/dL — ABNORMAL LOW (ref 30.0–36.0)
MCV: 78.1 fL — ABNORMAL LOW (ref 80.0–100.0)
Monocytes Absolute: 0.3 10*3/uL (ref 0.1–1.0)
Monocytes Relative: 5 %
Neutro Abs: 3.8 10*3/uL (ref 1.7–7.7)
Neutrophils Relative %: 58 %
Platelet Count: 209 10*3/uL (ref 150–400)
RBC: 4.74 MIL/uL (ref 3.87–5.11)
RDW: 14 % (ref 11.5–15.5)
WBC Count: 6.5 10*3/uL (ref 4.0–10.5)
nRBC: 0 % (ref 0.0–0.2)

## 2018-09-19 LAB — CMP (CANCER CENTER ONLY)
ALT: 27 U/L (ref 0–44)
AST: 17 U/L (ref 15–41)
Albumin: 4 g/dL (ref 3.5–5.0)
Alkaline Phosphatase: 132 U/L — ABNORMAL HIGH (ref 38–126)
Anion gap: 8 (ref 5–15)
BUN: 8 mg/dL (ref 6–20)
CO2: 28 mmol/L (ref 22–32)
Calcium: 9 mg/dL (ref 8.9–10.3)
Chloride: 106 mmol/L (ref 98–111)
Creatinine: 0.77 mg/dL (ref 0.44–1.00)
GFR, Est AFR Am: >60 mL/min (ref >60)
GFR, Estimated: >60 mL/min (ref >60)
Glucose, Bld: 120 mg/dL — ABNORMAL HIGH (ref 70–99)
Potassium: 4 mmol/L (ref 3.5–5.1)
Sodium: 142 mmol/L (ref 135–145)
Total Bilirubin: 0.4 mg/dL (ref 0.3–1.2)
Total Protein: 7 g/dL (ref 6.5–8.1)

## 2018-09-19 LAB — FERRITIN: Ferritin: 765 ng/mL — ABNORMAL HIGH (ref 11–307)

## 2018-09-19 LAB — RETICULOCYTES
Immature Retic Fract: 13.5 % (ref 2.3–15.9)
RBC.: 4.71 MIL/uL (ref 3.87–5.11)
Retic Count, Absolute: 54.6 10*3/uL (ref 19.0–186.0)
Retic Ct Pct: 1.2 % (ref 0.4–3.1)

## 2018-09-19 LAB — IRON AND TIBC
Iron: 55 ug/dL (ref 41–142)
Saturation Ratios: 22 % (ref 21–57)
TIBC: 249 ug/dL (ref 236–444)
UIBC: 194 ug/dL (ref 120–384)

## 2018-09-19 NOTE — Progress Notes (Signed)
Virtual Visit via Video Note  I connected with [name] on 09/19/18 at  2:20 PM EDT by a video enabled telemedicine application and verified that I am speaking with the correct person using two identifiers.   I discussed the limitations of evaluation and management by telemedicine and the availability of in person appointments. The patient expressed understanding and agreed to proceed.  History of Present Illness:  HTN-reports BP 136/90 this AM. Good compliance with meds.  Reports good compliance with potassium.  Hyperlipidemia- reports mild myalgia.  Takes crestor every day.  Migraines- denies any recent migraines.   Not needing trazodone. Sleeping well.   Allergic rhinitis- uses flonase which helps.     Observations/Objective:  Gen: awake, alert, NAD Resp: breathing is even and non-labored. Psychiatric: Calm, pleasant affect Neuro: Awake and alert x3, + facial symmetry speech is clear  Assessment and Plan:  Hypertension-blood pressure is stable.  Electrolytes also stable.  They were reviewed from patient's lab work done this morning hematology.  Migraines- stable without recent migraine symptoms.  Continue as needed Imitrex  Insomnia- stable without need for trazodone.  Allergic rhinitis- stable with use of Flonase. Follow Up Instructions:    I discussed the assessment and treatment plan with the patient. The patient was provided an opportunity to ask questions and all were answered. The patient agreed with the plan and demonstrated an understanding of the instructions.   The patient was advised to call back or seek an in-person evaluation if the symptoms worsen or if the condition fails to improve as anticipated.  I provided 10 minutes of non-face-to-face time during this encounter.   [name], NP

## 2018-09-19 NOTE — Progress Notes (Signed)
Hematology and Oncology Follow Up Visit  [name] [id number] 23-Jan-1961 58 y.o. 09/19/2018   Principle Diagnosis:  Recurrent iron deficiency anemia  Current Therapy:   IV iron as indicated - last dose in May 2017   Interim History: [name] is here today for follow-up.  She is doing okay.  She is surviving the stay at home order because of the coronavirus.  I know this is been a little bit tough on her.  She had no problems over the holidays.  It was relatively quiet for her.  She does state that she has occasional blood per rectum.  She had a colonoscopy a year ago.  This showed some internal hemorrhoids.  I do suspect that she probably is going to be iron deficient.  Her MCV is down.  She has had no problems with cough.  There is been no fever.  She has had no leg swelling.  Overall, her performance status is ECOG 1.    Medications:  Allergies as of 09/19/2018      Reactions   Ace Inhibitors    REACTION: chronic cough   Celebrex [celecoxib] Other (See Comments)   "makes me bleed"   Diovan [valsartan]    angioedema      Medication List       Accurate as of September 19, 2018  8:15 AM. Always use your most recent med list.        Albuterol Sulfate 108 (90 Base) MCG/ACT Aepb Inhale 2 puffs into the lungs 4 (four) times daily.   amLODipine 10 MG tablet Commonly known as:  NORVASC Take 1 tablet by mouth once daily   aspirin 81 MG chewable tablet Chew 1 tablet (81 mg total) by mouth daily.   azithromycin 250 MG tablet Commonly known as:  ZITHROMAX 2 tabs by mouth today, then one tab once daily for 4 more days.   ezetimibe 10 MG tablet Commonly known as:  ZETIA Take 1 tablet (10 mg total) by mouth daily.   famotidine 20 MG tablet Commonly known as:  PEPCID Take 1 tablet (20 mg total) by mouth 2 (two) times daily.   fluticasone 50 MCG/ACT nasal spray Commonly known as:  FLONASE Place 2 sprays into both nostrils daily.   meloxicam 15 MG tablet  Commonly known as:  MOBIC Take 1 tablet (15 mg total) by mouth daily.   methimazole 5 MG tablet Commonly known as:  TAPAZOLE Take 1 tablet (5 mg total) by mouth daily.   methocarbamol 500 MG tablet Commonly known as:  ROBAXIN TAKE 1 TABLET BY MOUTH EVERY 8 HOURS AS NEEDED FOR MUSCLE SPASMS   nystatin powder Commonly known as:  MYCOSTATIN/NYSTOP Apply topically 2 (two) times daily.   ondansetron 4 MG tablet Commonly known as:  ZOFRAN Take 1 tab by mouth every 6 hours as needed for nausea.   polyethylene glycol packet Commonly known as:  MIRALAX / GLYCOLAX Take 17 g by mouth 2 (two) times daily.   potassium chloride 10 MEQ tablet Commonly known as:  K-DUR Take 1 tablet (10 mEq total) by mouth daily. Please schedule appt for future refills. 1st attempt.   potassium chloride SA 20 MEQ tablet Commonly known as:  K-DUR,KLOR-CON Take 1 tablet (20 mEq total) by mouth daily.   predniSONE 10 MG tablet Commonly known as:  DELTASONE 4 tabs by mouth once daily for 2 days, then 3 tabs daily x 2 days, then 2 tabs daily x 2 days, then 1 tab daily x  2 days   rosuvastatin 40 MG tablet Commonly known as:  CRESTOR Take 1 tablet (40 mg total) by mouth every other day.   SUMAtriptan 50 MG tablet Commonly known as:  IMITREX Take 50 mg by mouth every 2 (two) hours as needed for migraine. May repeat in 2 hours if headache persists or recurs.   traMADol 50 MG tablet Commonly known as:  ULTRAM Take 1 tablet (50 mg total) by mouth every 8 (eight) hours as needed.   traZODone 50 MG tablet Commonly known as:  DESYREL Take 0.5-1 tablets (25-50 mg total) by mouth at bedtime as needed for sleep.       Allergies:  Allergies  Allergen Reactions  . Ace Inhibitors     REACTION: chronic cough  . Celebrex [Celecoxib] Other (See Comments)    "makes me bleed"  . Diovan [Valsartan]     angioedema    Past Medical History, Surgical history, Social history, and Family History were reviewed and  updated.  Review of Systems: Review of Systems  Constitutional: Positive for malaise/fatigue.  HENT: Negative.   Eyes: Negative.   Respiratory: Negative.   Cardiovascular: Negative.   Gastrointestinal: Negative.   Genitourinary: Negative.   Musculoskeletal: Negative.   Skin: Negative.   Neurological: Negative.   Endo/Heme/Allergies: Negative.   Psychiatric/Behavioral: The patient has insomnia.      Physical Exam:  vitals were not taken for this visit.   Wt Readings from Last 3 Encounters:  09/01/18 228 lb (103.4 kg)  08/06/18 225 lb (102.1 kg)  06/20/18 225 lb 4 oz (102.2 kg)    Physical Exam Vitals signs reviewed.  HENT:     Head: Normocephalic and atraumatic.  Eyes:     Pupils: Pupils are equal, round, and reactive to light.  Neck:     Musculoskeletal: Normal range of motion.  Cardiovascular:     Rate and Rhythm: Normal rate and regular rhythm.     Heart sounds: Normal heart sounds.  Pulmonary:     Effort: Pulmonary effort is normal.     Breath sounds: Normal breath sounds.  Abdominal:     General: Bowel sounds are normal.     Palpations: Abdomen is soft.  Musculoskeletal: Normal range of motion.        General: No tenderness or deformity.  Lymphadenopathy:     Cervical: No cervical adenopathy.  Skin:    General: Skin is warm and dry.     Findings: No erythema or rash.  Neurological:     Mental Status: She is alert and oriented to person, place, and time.  Psychiatric:        Behavior: Behavior normal.        Thought Content: Thought content normal.        Judgment: Judgment normal.      Lab Results  Component Value Date   WBC 6.5 09/19/2018   HGB 10.9 (L) 09/19/2018   HCT 37.0 09/19/2018   MCV 78.1 (L) 09/19/2018   PLT 209 09/19/2018   Lab Results  Component Value Date   FERRITIN 753 (H) 05/21/2018   IRON 65 05/21/2018   TIBC 290 05/21/2018   UIBC 225 05/21/2018   IRONPCTSAT 22 05/21/2018   Lab Results  Component Value Date    RETICCTPCT 1.2 09/19/2018   RBC 4.74 09/19/2018   RBC 4.71 09/19/2018   RETICCTABS 48.5 04/08/2015   No results found for: KPAFRELGTCHN, LAMBDASER, [facility] Lab Results  Component Value Date   IGA 305 05/04/2009  Lab Results  Component Value Date   TOTALPROTELP 7.2 05/04/2009   ALBUMINELP 49.1 (L) 05/04/2009   A1GS 8.3 (H) 05/04/2009   A2GS 14.1 (H) 05/04/2009   BETS 6.6 05/04/2009   GAMS 16.4 05/04/2009   MSPIKE NOT DET 05/04/2009     Chemistry      Component Value Date/Time   NA 141 05/01/2018 1233   NA 140 08/26/2017 1112   NA 139 12/14/2016 0849   K 4.0 05/01/2018 1233   K 3.7 12/14/2016 0849   CL 106 05/01/2018 1233   CL 99 08/16/2015 1055   CO2 29 05/01/2018 1233   CO2 25 12/14/2016 0849   BUN 14 05/01/2018 1233   BUN 15 08/26/2017 1112   BUN 11.4 12/14/2016 0849   CREATININE 0.77 05/01/2018 1233   CREATININE 0.9 12/14/2016 0849      Component Value Date/Time   CALCIUM 9.3 05/01/2018 1233   CALCIUM 9.6 12/14/2016 0849   ALKPHOS 126 05/01/2018 1233   ALKPHOS 135 12/14/2016 0849   AST 18 05/01/2018 1233   AST 22 12/14/2016 0849   ALT 22 05/01/2018 1233   ALT 32 12/14/2016 0849   BILITOT 0.3 05/01/2018 1233   BILITOT 0.29 12/14/2016 0849      Impression and Plan: Ms. Macfadden is a very pleasant 58 yo African American female with history of iron deficiency anemia.  We will see what her iron studies show.  Again, I will have to believe that her iron is going to be on the low side.  We will follow her up in another couple months now.  I think we have to maintain a little closer follow-up given the fact that she is more anemic.   [name], MD 3/27/20208:15 AM

## 2018-09-22 LAB — ERYTHROPOIETIN: Erythropoietin: 5.1 m[IU]/mL (ref 2.6–18.5)

## 2018-10-01 NOTE — Telephone Encounter (Signed)
Noted  

## 2018-10-01 NOTE — Telephone Encounter (Signed)
Copied from [place] [id number]. Topic: General - Other >> Oct 01, 2018 10:42 AM Yvette Rack wrote: Reason for CRM: Leah with [facility] stated they have tried to reach the patient on several occassions but patient is not returning their calls. Cb# [phone] Ext. [phone]

## 2018-10-23 ENCOUNTER — Other Ambulatory Visit: Payer: Self-pay | Admitting: Family

## 2018-11-04 ENCOUNTER — Encounter: Payer: Self-pay | Admitting: Endocrinology

## 2018-11-04 ENCOUNTER — Ambulatory Visit (INDEPENDENT_AMBULATORY_CARE_PROVIDER_SITE_OTHER): Payer: Medicare Other | Admitting: Endocrinology

## 2018-11-04 ENCOUNTER — Other Ambulatory Visit: Payer: Self-pay

## 2018-11-04 VITALS — BP 140/70 | HR 74 | Ht 64.0 in | Wt 229.8 lb

## 2018-11-04 DIAGNOSIS — E0590 Thyrotoxicosis, unspecified without thyrotoxic crisis or storm: Secondary | ICD-10-CM | POA: Diagnosis not present

## 2018-11-04 LAB — TSH: TSH: 3.09 u[IU]/mL (ref 0.35–4.50)

## 2018-11-04 LAB — T4, FREE: Free T4: 0.67 ng/dL (ref 0.60–1.60)

## 2018-11-04 NOTE — Progress Notes (Signed)
Subjective:    Patient ID: [name], female    DOB: April 26, 1961, 58 y.o.   MRN: [id number]  HPI Pt returns for f/u of hyperthyroidism (dx'ed 2012; she chose tapazole rx; she has never had thyroid imaging).  pt states she feels no different, and well in general.  Specifically, she denies palpitations and tremor.   Past Medical History:  Diagnosis Date  . Allergy    allergic rhinitis  . Arthritis   . Atypical chest pain   . Constipation   . Cyst, ovarian   . Diabetes mellitus, type II ([place])   . Endometriosis   . Esophageal stricture   . Gastroparesis   . GERD (gastroesophageal reflux disease)   . Heart murmur   . Hyperlipidemia   . Hypertension   . 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 Iron deficiency anemia   . Migraine headache   . Non-compliance   . Peptic ulcer disease   . Thyroid disease     Past Surgical History:  Procedure Laterality Date  . ABDOMINAL EXPLORATION SURGERY     w/bso   . BREAST BIOPSY    . CARDIAC CATHETERIZATION  2009   mild non obstructive CAD  . CHOLECYSTECTOMY    . COLONOSCOPY    . KNEE SURGERY  2005    left knee  . LEFT HEART CATH AND CORONARY ANGIOGRAPHY N/A 07/17/2017   Procedure: LEFT HEART CATH AND CORONARY ANGIOGRAPHY;  Surgeon: Martinique, Peter M, MD;  Location: [facility];  Service: Cardiovascular;  Laterality: N/A;  . POLYPECTOMY    . TOTAL ABDOMINAL HYSTERECTOMY    . UPPER GASTROINTESTINAL ENDOSCOPY      Social History   Socioeconomic History  . Marital status: Widowed    Spouse name: Not on file  . Number of children: 2  . Years of education: Not on file  . Highest education level: Not on file  Occupational History  . Occupation: DISABILITY    Employer: UNEMPLOYED  Social Needs  . Financial resource strain: Not on file  . Food insecurity:    Worry: Not on file    Inability: Not on file  . Transportation needs:    Medical: Not on file    Non-medical:  Not on file  Tobacco Use  . Smoking status: Former Smoker    Packs/day: 0.50    Years: 18.00    Pack years: 9.00    Types: Cigarettes    Start date: 07/29/1977    Last attempt to quit: 06/25/1994    Years since quitting: 24.3  . Smokeless tobacco: Never Used  . Tobacco comment: quit 19 years ago  Substance and Sexual Activity  . Alcohol use: Yes    Alcohol/week: 0.0 standard drinks    Comment: social drinker  . Drug use: No  . Sexual activity: Not Currently  Lifestyle  . Physical activity:    Days per week: Not on file    Minutes per session: Not on file  . Stress: Not on file  Relationships  . Social connections:    Talks on phone: Not on file    Gets together: Not on file    Attends religious service: Not on file    Active member of club or organization: Not on file    Attends meetings of clubs or organizations: Not on file    Relationship status: Not on file  . Intimate partner violence:    Fear of  current or ex partner: Not on file    Emotionally abused: Not on file    Physically abused: Not on file    Forced sexual activity: Not on file  Other Topics Concern  . Not on file  Social History Narrative   Widowed   Has 2 grown children.  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   Former Smoker Quit tobacco in 1996.  She was a pack a day smoker for approximately 10 years.   Alcohol use-yes: Social    Daily Caffeine Use:6 pack of pepsi daily     Illicit Drug Use - no    Patient does not get regular exercise.       Smoking Status:  quit    Current Outpatient Medications on File Prior to Visit  Medication Sig Dispense Refill  . Albuterol Sulfate 108 (90 Base) MCG/ACT AEPB Inhale 2 puffs into the lungs 4 (four) times daily. 1 each 3  . amLODipine (NORVASC) 10 MG tablet Take 1 tablet by mouth once daily 90 tablet 1  . aspirin 81 MG chewable tablet Chew 1 tablet (81 mg total) by mouth daily.    Marland Kitchen ezetimibe (ZETIA) 10 MG tablet  Take 1 tablet (10 mg total) by mouth daily. 90 tablet 3  . famotidine (PEPCID) 20 MG tablet Take 1 tablet (20 mg total) by mouth 2 (two) times daily. 60 tablet 11  . fluticasone (FLONASE) 50 MCG/ACT nasal spray Place 2 sprays into both nostrils daily. 16 g 1  . meloxicam (MOBIC) 15 MG tablet Take 1 tablet (15 mg total) by mouth daily. 30 tablet 0  . methimazole (TAPAZOLE) 5 MG tablet Take 1 tablet (5 mg total) by mouth daily. 90 tablet 3  . methocarbamol (ROBAXIN) 500 MG tablet TAKE 1 TABLET BY MOUTH EVERY 8 HOURS AS NEEDED FOR MUSCLE SPASMS 20 tablet 0  . nystatin (MYCOSTATIN/NYSTOP) powder Apply topically 2 (two) times daily. 60 g 0  . ondansetron (ZOFRAN) 4 MG tablet Take 1 tab by mouth every 6 hours as needed for nausea. 100 tablet 3  . polyethylene glycol (MIRALAX / GLYCOLAX) packet Take 17 g by mouth 2 (two) times daily. 14 each 0  . potassium chloride (K-DUR) 10 MEQ tablet Take 1 tablet (10 mEq total) by mouth daily. Please schedule appt for future refills. 1st attempt. 90 tablet 0  . potassium chloride SA (K-DUR,KLOR-CON) 20 MEQ tablet Take 1 tablet (20 mEq total) by mouth daily. 30 tablet 5  . rosuvastatin (CRESTOR) 40 MG tablet Take 1 tablet (40 mg total) by mouth every other day. 45 tablet 1  . SUMAtriptan (IMITREX) 50 MG tablet Take 50 mg by mouth every 2 (two) hours as needed for migraine. May repeat in 2 hours if headache persists or recurs.    . [DISCONTINUED] atorvastatin (LIPITOR) 40 MG tablet Take 1 tablet (40 mg total) by mouth daily. 30 tablet 3   Current Facility-Administered Medications on File Prior to Visit  Medication Dose Route Frequency Provider Last Rate Last Dose  . 0.9 %  sodium chloride infusion  500 mL Intravenous Once Armbruster, Carlota Raspberry, MD        Allergies  Allergen Reactions  . Ace Inhibitors     REACTION: chronic cough  . Celebrex [Celecoxib] Other (See Comments)    "makes me bleed"  . Diovan [Valsartan]     angioedema    Family History  Problem  Relation Age of Onset  . Hypertension Mother   .  Rheum arthritis Mother   . Hypertension Father   . Diabetes Father   . Prostate cancer Father   . Kidney disease Father   . Diabetes Sister   . Fibromyalgia Sister   . Diabetes Maternal Grandmother   . Lung cancer Maternal Grandfather   . Arthritis Brother   . Migraines Brother   . CAD Brother   . Fibromyalgia Sister   . Migraines Sister   . Colon cancer Neg Hx   . Thyroid disease Neg Hx   . Colon polyps Neg Hx     BP 140/70 (BP Location: Left Arm, Patient Position: Sitting, Cuff Size: Large)   Pulse 74   Ht 5\' 4"  (1.626 m)   Wt 229 lb 12.8 oz (104.2 kg)   SpO2 95%   BMI 39.45 kg/m    Review of Systems Denies fever    Objective:   Physical Exam VITAL SIGNS:  See vs page GENERAL: no distress NECK: There is no palpable thyroid enlargement.  No thyroid nodule is palpable.  No palpable lymphadenopathy at the anterior neck.   Lab Results  Component Value Date   TSH 3.09 11/04/2018   T4TOTAL 12.3 11/28/2011      Assessment & Plan:  Hyperthyroidism: well-controlled.  Please continue the same medication. Gastroparesis: in this setting, she needs to maintain euthyroidism.   Patient Instructions  Your blood pressure is high today.  Please see your primary care provider soon, to have it rechecked blood tests are requested for you today.  We'll let you know about the results.   If ever you have fever while taking methimazole, stop it and call us, even if the reason is obvious, because of the risk of a rare side-effect. Please come back for a follow-up appointment in 4-5 months.

## 2018-11-04 NOTE — Patient Instructions (Addendum)
Your blood pressure is high today.  Please see your primary care provider soon, to have it rechecked blood tests are requested for you today.  We'll let you know about the results.   If ever you have fever while taking methimazole, stop it and call us, even if the reason is obvious, because of the risk of a rare side-effect. Please come back for a follow-up appointment in 4-5 months.

## 2018-11-21 ENCOUNTER — Telehealth: Payer: Self-pay | Admitting: Family

## 2018-11-21 ENCOUNTER — Encounter: Payer: Self-pay | Admitting: Family

## 2018-11-21 ENCOUNTER — Other Ambulatory Visit: Payer: Self-pay

## 2018-11-21 ENCOUNTER — Inpatient Hospital Stay: Payer: Medicare Other

## 2018-11-21 ENCOUNTER — Inpatient Hospital Stay: Payer: Medicare Other | Attending: Hematology & Oncology | Admitting: Family

## 2018-11-21 VITALS — BP 141/73 | HR 61 | Temp 98.2°F | Resp 18 | Ht 64.0 in | Wt 230.8 lb

## 2018-11-21 DIAGNOSIS — R5383 Other fatigue: Secondary | ICD-10-CM | POA: Diagnosis not present

## 2018-11-21 DIAGNOSIS — D509 Iron deficiency anemia, unspecified: Secondary | ICD-10-CM | POA: Diagnosis present

## 2018-11-21 DIAGNOSIS — Z79899 Other long term (current) drug therapy: Secondary | ICD-10-CM

## 2018-11-21 DIAGNOSIS — D500 Iron deficiency anemia secondary to blood loss (chronic): Secondary | ICD-10-CM

## 2018-11-21 LAB — CMP (CANCER CENTER ONLY)
ALT: 20 U/L (ref 0–44)
AST: 17 U/L (ref 15–41)
Albumin: 4.2 g/dL (ref 3.5–5.0)
Alkaline Phosphatase: 125 U/L (ref 38–126)
Anion gap: 9 (ref 5–15)
BUN: 14 mg/dL (ref 6–20)
CO2: 26 mmol/L (ref 22–32)
Calcium: 8.9 mg/dL (ref 8.9–10.3)
Chloride: 105 mmol/L (ref 98–111)
Creatinine: 0.85 mg/dL (ref 0.44–1.00)
GFR, Est AFR Am: >60 mL/min (ref >60)
GFR, Estimated: >60 mL/min (ref >60)
Glucose, Bld: 135 mg/dL — ABNORMAL HIGH (ref 70–99)
Potassium: 3.5 mmol/L (ref 3.5–5.1)
Sodium: 140 mmol/L (ref 135–145)
Total Bilirubin: 0.4 mg/dL (ref 0.3–1.2)
Total Protein: 7.3 g/dL (ref 6.5–8.1)

## 2018-11-21 LAB — RETICULOCYTES
Immature Retic Fract: 10.1 % (ref 2.3–15.9)
RBC.: 4.95 MIL/uL (ref 3.87–5.11)
Retic Count, Absolute: 55.9 10*3/uL (ref 19.0–186.0)
Retic Ct Pct: 1.1 % (ref 0.4–3.1)

## 2018-11-21 LAB — CBC WITH DIFFERENTIAL (CANCER CENTER ONLY)
Abs Immature Granulocytes: 0.01 10*3/uL (ref 0.00–0.07)
Basophils Absolute: 0 10*3/uL (ref 0.0–0.1)
Basophils Relative: 0 %
Eosinophils Absolute: 0.1 10*3/uL (ref 0.0–0.5)
Eosinophils Relative: 1 %
HCT: 38.7 % (ref 36.0–46.0)
Hemoglobin: 11.3 g/dL — ABNORMAL LOW (ref 12.0–15.0)
Immature Granulocytes: 0 %
Lymphocytes Relative: 38 %
Lymphs Abs: 2.8 10*3/uL (ref 0.7–4.0)
MCH: 22.5 pg — ABNORMAL LOW (ref 26.0–34.0)
MCHC: 29.2 g/dL — ABNORMAL LOW (ref 30.0–36.0)
MCV: 77.1 fL — ABNORMAL LOW (ref 80.0–100.0)
Monocytes Absolute: 0.4 10*3/uL (ref 0.1–1.0)
Monocytes Relative: 6 %
Neutro Abs: 4 10*3/uL (ref 1.7–7.7)
Neutrophils Relative %: 55 %
Platelet Count: 219 10*3/uL (ref 150–400)
RBC: 5.02 MIL/uL (ref 3.87–5.11)
RDW: 13.7 % (ref 11.5–15.5)
WBC Count: 7.3 10*3/uL (ref 4.0–10.5)
nRBC: 0 % (ref 0.0–0.2)

## 2018-11-21 LAB — IRON AND TIBC
Iron: 56 ug/dL (ref 41–142)
Saturation Ratios: 23 % (ref 21–57)
TIBC: 243 ug/dL (ref 236–444)
UIBC: 186 ug/dL (ref 120–384)

## 2018-11-21 LAB — FERRITIN: Ferritin: 758 ng/mL — ABNORMAL HIGH (ref 11–307)

## 2018-11-21 NOTE — Telephone Encounter (Signed)
Appointments scheduled patient notified/  Also sent text for My Chart sign up/ per 5/29 los

## 2018-11-21 NOTE — Progress Notes (Signed)
Hematology and Oncology Follow Up Visit  [name] [id number] Sep 22, 1960 58 y.o. 11/21/2018   Principle Diagnosis:  Recurrent iron deficiency anemia  Current Therapy:   IV iron as indicated    Interim History:  [name] is here today for follow-up. She is doing well but has had some mild fatigue at times.  She states that she has never been one that slept well at night.  No episodes of bleeding, no bruising or petechiae.  No fever, chills, n/v, cough, rash, dizziness, SOB, chest pain, palpitations, abdominal pain or changes in bowel or bladder habits.  No swelling, tenderness, numbness or tingling in her extremities.  No lymphadenopathy noted on exam.  She has maintained a good appetite and is staying well hydrated. Her weight is stable.  She is walking daily for exercise.   ECOG Performance Status: 0 - Asymptomatic  Medications:  Allergies as of 11/21/2018      Reactions   Ace Inhibitors    REACTION: chronic cough   Celebrex [celecoxib] Other (See Comments)   "makes me bleed"   Diovan [valsartan]    angioedema      Medication List       Accurate as of Nov 21, 2018  9:08 AM. If you have any questions, ask your nurse or doctor.        Albuterol Sulfate 108 (90 Base) MCG/ACT Aepb Inhale 2 puffs into the lungs 4 (four) times daily.   amLODipine 10 MG tablet Commonly known as:  NORVASC Take 1 tablet by mouth once daily   aspirin 81 MG chewable tablet Chew 1 tablet (81 mg total) by mouth daily.   ezetimibe 10 MG tablet Commonly known as:  ZETIA Take 1 tablet (10 mg total) by mouth daily.   famotidine 20 MG tablet Commonly known as:  PEPCID Take 1 tablet (20 mg total) by mouth 2 (two) times daily.   fluticasone 50 MCG/ACT nasal spray Commonly known as:  FLONASE Place 2 sprays into both nostrils daily.   meloxicam 15 MG tablet Commonly known as:  MOBIC Take 1 tablet (15 mg total) by mouth daily.   methimazole 5 MG tablet Commonly known as:   TAPAZOLE Take 1 tablet (5 mg total) by mouth daily.   methocarbamol 500 MG tablet Commonly known as:  ROBAXIN TAKE 1 TABLET BY MOUTH EVERY 8 HOURS AS NEEDED FOR MUSCLE SPASMS   nystatin powder Commonly known as:  MYCOSTATIN/NYSTOP Apply topically 2 (two) times daily.   ondansetron 4 MG tablet Commonly known as:  ZOFRAN Take 1 tab by mouth every 6 hours as needed for nausea.   polyethylene glycol 17 g packet Commonly known as:  MIRALAX / GLYCOLAX Take 17 g by mouth 2 (two) times daily.   potassium chloride 10 MEQ tablet Commonly known as:  K-DUR Take 1 tablet (10 mEq total) by mouth daily. Please schedule appt for future refills. 1st attempt.   potassium chloride SA 20 MEQ tablet Commonly known as:  K-DUR Take 1 tablet (20 mEq total) by mouth daily.   rosuvastatin 40 MG tablet Commonly known as:  CRESTOR Take 1 tablet (40 mg total) by mouth every other day.   SUMAtriptan 50 MG tablet Commonly known as:  IMITREX Take 50 mg by mouth every 2 (two) hours as needed for migraine. May repeat in 2 hours if headache persists or recurs.       Allergies:  Allergies  Allergen Reactions  . Ace Inhibitors     REACTION: chronic cough  .  Celebrex [Celecoxib] Other (See Comments)    "makes me bleed"  . Diovan [Valsartan]     angioedema    Past Medical History, Surgical history, Social history, and Family History were reviewed and updated.  Review of Systems: All other 10 point review of systems is negative.   Physical Exam:  height is 5\' 4"  (1.626 m) and weight is 230 lb 12.8 oz (104.7 kg). Her oral temperature is 98.2 F (36.8 C). Her blood pressure is 141/73 (abnormal) and her pulse is 61. Her respiration is 18 and oxygen saturation is 100%.   Wt Readings from Last 3 Encounters:  11/21/18 230 lb 12.8 oz (104.7 kg)  11/04/18 229 lb 12.8 oz (104.2 kg)  09/19/18 230 lb (104.3 kg)    Ocular: Sclerae unicteric, pupils equal, round and reactive to light Ear-nose-throat:  Oropharynx clear, dentition fair Lymphatic: No cervical or supraclavicular adenopathy Lungs no rales or rhonchi, good excursion bilaterally Heart regular rate and rhythm, no murmur appreciated Abd soft, nontender, positive bowel sounds, no liver or spleen tip palpated on exam, no fluid wave MSK no focal spinal tenderness, no joint edema Neuro: non-focal, well-oriented, appropriate affect Breasts: Deferred   Lab Results  Component Value Date   WBC 7.3 11/21/2018   HGB 11.3 (L) 11/21/2018   HCT 38.7 11/21/2018   MCV 77.1 (L) 11/21/2018   PLT 219 11/21/2018   Lab Results  Component Value Date   FERRITIN 765 (H) 09/19/2018   IRON 55 09/19/2018   TIBC 249 09/19/2018   UIBC 194 09/19/2018   IRONPCTSAT 22 09/19/2018   Lab Results  Component Value Date   RETICCTPCT 1.1 11/21/2018   RBC 5.02 11/21/2018   RBC 4.95 11/21/2018   RETICCTABS 48.5 04/08/2015   No results found for: Nils P[facility] Lab Results  Component Value Date   IGA 305 05/04/2009   Lab Results  Component Value Date   TOTALPROTELP 7.2 05/04/2009   ALBUMINELP 49.1 (L) 05/04/2009   A1GS 8.3 (H) 05/04/2009   A2GS 14.1 (H) 05/04/2009   BETS 6.6 05/04/2009   GAMS 16.4 05/04/2009   MSPIKE NOT DET 05/04/2009     Chemistry      Component Value Date/Time   NA 142 09/19/2018 0750   NA 140 08/26/2017 1112   NA 139 12/14/2016 0849   K 4.0 09/19/2018 0750   K 3.7 12/14/2016 0849   CL 106 09/19/2018 0750   CL 99 08/16/2015 1055   CO2 28 09/19/2018 0750   CO2 25 12/14/2016 0849   BUN 8 09/19/2018 0750   BUN 15 08/26/2017 1112   BUN 11.4 12/14/2016 0849   CREATININE 0.77 09/19/2018 0750   CREATININE 0.9 12/14/2016 0849      Component Value Date/Time   CALCIUM 9.0 09/19/2018 0750   CALCIUM 9.6 12/14/2016 0849   ALKPHOS 132 (H) 09/19/2018 0750   ALKPHOS 135 12/14/2016 0849   AST 17 09/19/2018 0750   AST 22 12/14/2016 0849   ALT 27 09/19/2018 0750   ALT 32 12/14/2016 0849   BILITOT  0.4 09/19/2018 0750   BILITOT 0.29 12/14/2016 0849       Impression and Plan: [name] is a very pleasant 58 yo African American female with history of iron deficiency anemia.  We will see what her iron studies show and bring her back in for infusion if needed.  We will plan to see her back in another 6 months for follow-up.  She will contact our office with any questions or  concerns. We can certainly see her sooner if need be.  Laverna Peace, NP 5/29/20209:08 AM

## 2018-11-24 ENCOUNTER — Ambulatory Visit: Payer: Medicare PPO | Admitting: Endocrinology

## 2018-12-04 ENCOUNTER — Other Ambulatory Visit: Payer: Self-pay | Admitting: Family

## 2018-12-21 ENCOUNTER — Other Ambulatory Visit: Payer: Self-pay | Admitting: Internal Medicine

## 2019-01-20 ENCOUNTER — Ambulatory Visit (INDEPENDENT_AMBULATORY_CARE_PROVIDER_SITE_OTHER): Payer: Medicare Other | Admitting: Family

## 2019-01-20 ENCOUNTER — Other Ambulatory Visit: Payer: Self-pay

## 2019-01-20 DIAGNOSIS — M545 Low back pain, unspecified: Secondary | ICD-10-CM

## 2019-01-20 MED ORDER — METHYLPREDNISOLONE 4 MG PO TBPK: ORAL_TABLET | ORAL | 0 refills | Status: DC

## 2019-01-20 NOTE — Progress Notes (Signed)
Virtual Visit via Telephone Note  I connected with [name] on 01/20/19 at  5:20 PM EDT by telephone and verified that I am speaking with the correct person using two identifiers.  Location: Patient: home Provider: work   I discussed the limitations, risks, security and privacy concerns of performing an evaluation and management service by telephone and the availability of in person appointments. I also discussed with the patient that there may be a patient responsible charge related to this service. The patient expressed understanding and agreed to proceed.   History of Present Illness:  Patient is a 58 yr old female who presents today with chief complaint of low back pain. Reports that pain is located in the mid low back. Pain has been present for >6 months. Pain is worsening in nature. Denies associated LE weakness or numbness. She denies loss of bowel/bladder.  Reports that she she has been taking advil pm which helps her sleep but does not significantly improve her pain.  Pain is "all the time."    She reports that she had an MRI performed by her neurosurgeon previously and was told that she has 2 bulging discs.  Reports that she was never contacted to arrange follow up appointment after we placed referral.    Past Medical History:  Diagnosis Date  . Allergy    allergic rhinitis  . Arthritis   . Atypical chest pain   . Constipation   . Cyst, ovarian   . Diabetes mellitus, type II ([place])   . Endometriosis   . Esophageal stricture   . Gastroparesis   . GERD (gastroesophageal reflux disease)   . Heart murmur   . Hyperlipidemia   . Hypertension   . 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 Iron deficiency anemia   . Migraine headache   . Non-compliance   . Peptic ulcer disease   . Thyroid disease      Social History   Socioeconomic History  . Marital status: Widowed    Spouse name: Not on file  . Number of  children: 2  . Years of education: Not on file  . Highest education level: Not on file  Occupational History  . Occupation: DISABILITY    Employer: UNEMPLOYED  Social Needs  . Financial resource strain: Not on file  . Food insecurity    Worry: Not on file    Inability: Not on file  . Transportation needs    Medical: Not on file    Non-medical: Not on file  Tobacco Use  . Smoking status: Former Smoker    Packs/day: 0.50    Years: 18.00    Pack years: 9.00    Types: Cigarettes    Start date: 07/29/1977    Quit date: 06/25/1994    Years since quitting: 24.5  . Smokeless tobacco: Never Used  . Tobacco comment: quit 19 years ago  Substance and Sexual Activity  . Alcohol use: Yes    Alcohol/week: 0.0 standard drinks    Comment: social drinker  . Drug use: No  . Sexual activity: Not Currently  Lifestyle  . Physical activity    Days per week: Not on file    Minutes per session: Not on file  . Stress: Not on file  Relationships  . Social Herbalist on phone: Not on file    Gets together: Not on file    Attends religious service: Not on file  Active member of club or organization: Not on file    Attends meetings of clubs or organizations: Not on file    Relationship status: Not on file  . Intimate partner violence    Fear of current or ex partner: Not on file    Emotionally abused: Not on file    Physically abused: Not on file    Forced sexual activity: Not on file  Other Topics Concern  . Not on file  Social History Narrative   Widowed   Has 2 grown children.  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   Former Smoker Quit tobacco in 1996.  She was a pack a day smoker for approximately 10 years.   Alcohol use-yes: Social    Daily Caffeine Use:6 pack of pepsi daily     Illicit Drug Use - no    Patient does not get regular exercise.       Smoking Status:  quit    Past Surgical History:  Procedure Laterality Date  .  ABDOMINAL EXPLORATION SURGERY     w/bso   . BREAST BIOPSY    . CARDIAC CATHETERIZATION  2009   mild non obstructive CAD  . CHOLECYSTECTOMY    . COLONOSCOPY    . KNEE SURGERY  2005    left knee  . LEFT HEART CATH AND CORONARY ANGIOGRAPHY N/A 07/17/2017   Procedure: LEFT HEART CATH AND CORONARY ANGIOGRAPHY;  Surgeon: Martinique, Peter M, MD;  Location: [facility];  Service: Cardiovascular;  Laterality: N/A;  . POLYPECTOMY    . TOTAL ABDOMINAL HYSTERECTOMY    . UPPER GASTROINTESTINAL ENDOSCOPY      Family History  Problem Relation Age of Onset  . Hypertension Mother   . Rheum arthritis Mother   . Hypertension Father   . Diabetes Father   . Prostate cancer Father   . Kidney disease Father   . Diabetes Sister   . Fibromyalgia Sister   . Diabetes Maternal Grandmother   . Lung cancer Maternal Grandfather   . Arthritis Brother   . Migraines Brother   . CAD Brother   . Fibromyalgia Sister   . Migraines Sister   . Colon cancer Neg Hx   . Thyroid disease Neg Hx   . Colon polyps Neg Hx     Allergies  Allergen Reactions  . Ace Inhibitors     REACTION: chronic cough  . Celebrex [Celecoxib] Other (See Comments)    "makes me bleed"  . Diovan [Valsartan]     angioedema    Current Outpatient Medications on File Prior to Visit  Medication Sig Dispense Refill  . Albuterol Sulfate 108 (90 Base) MCG/ACT AEPB Inhale 2 puffs into the lungs 4 (four) times daily. 1 each 3  . amLODipine (NORVASC) 10 MG tablet Take 1 tablet by mouth once daily 90 tablet 1  . aspirin 81 MG chewable tablet Chew 1 tablet (81 mg total) by mouth daily.    Marland Kitchen ezetimibe (ZETIA) 10 MG tablet Take 1 tablet (10 mg total) by mouth daily. 90 tablet 3  . famotidine (PEPCID) 20 MG tablet Take 1 tablet (20 mg total) by mouth 2 (two) times daily. 60 tablet 11  . fluticasone (FLONASE) 50 MCG/ACT nasal spray Place 2 sprays into both nostrils daily. 16 g 1  . meloxicam (MOBIC) 15 MG tablet Take 1 tablet (15 mg total) by  mouth daily. 30 tablet 0  . methocarbamol (ROBAXIN) 500 MG tablet TAKE  1 TABLET BY MOUTH EVERY 8 HOURS AS NEEDED FOR MUSCLE SPASMS 20 tablet 0  . nystatin (MYCOSTATIN/NYSTOP) powder Apply topically 2 (two) times daily. 60 g 0  . ondansetron (ZOFRAN) 4 MG tablet Take 1 tab by mouth every 6 hours as needed for nausea. 100 tablet 3  . polyethylene glycol (MIRALAX / GLYCOLAX) packet Take 17 g by mouth 2 (two) times daily. 14 each 0  . potassium chloride (K-DUR) 10 MEQ tablet Take 1 tablet (10 mEq total) by mouth daily. Please make annual appt for future refills. Thank you. 2nd attempt 30 tablet 0  . potassium chloride SA (K-DUR,KLOR-CON) 20 MEQ tablet Take 1 tablet (20 mEq total) by mouth daily. 30 tablet 5  . rosuvastatin (CRESTOR) 40 MG tablet TAKE 1 TABLET BY MOUTH EVERY OTHER DAY 45 tablet 0  . SUMAtriptan (IMITREX) 50 MG tablet Take 50 mg by mouth every 2 (two) hours as needed for migraine. May repeat in 2 hours if headache persists or recurs.    . methimazole (TAPAZOLE) 5 MG tablet Take 1 tablet (5 mg total) by mouth daily. (Patient not taking: Reported on 01/20/2019) 90 tablet 3  . [DISCONTINUED] atorvastatin (LIPITOR) 40 MG tablet Take 1 tablet (40 mg total) by mouth daily. 30 tablet 3   Current Facility-Administered Medications on File Prior to Visit  Medication Dose Route Frequency Provider Last Rate Last Dose  . 0.9 %  sodium chloride infusion  500 mL Intravenous Once Armbruster, Carlota Raspberry, MD        There were no vitals taken for this visit.  Observations/Objective:  Gen: Awake, alert, no acute distress Resp: Breathing is even and non-labored Psych: calm/pleasant demeanor Neuro: Alert and Oriented x 3, speech is clear.    Assessment and Plan:  Low back pain- advised pt to begin medrol dose pak. Call if symptoms worsen or if not improved in 1 week. Pt verbalizes understanding.  Follow Up Instructions:    I discussed the assessment and treatment plan with the patient. The  patient was provided an opportunity to ask questions and all were answered. The patient agreed with the plan and demonstrated an understanding of the instructions.   The patient was advised to call back or seek an in-person evaluation if the symptoms worsen or if the condition fails to improve as anticipated.  I provided 11 minutes of non-face-to-face time during this encounter.   [name], NP

## 2019-01-25 ENCOUNTER — Other Ambulatory Visit: Payer: Self-pay | Admitting: Internal Medicine

## 2019-02-11 NOTE — Progress Notes (Signed)
Cardiology Office Note   Date:  02/12/2019   ID:  [name], DOB [birth year], MRN [id number]  PCP:  [name], NP  Cardiologist:   [name], MD   F/U of CAD    History of Present Illness: [name] is a 58 y.o. female with a history of CAD Minimal CAD at cath 2009  FHx of CAD  Remote tobacco  Seen by Kathleen Argue in 2013 She wa admitted in Jan 2019  with CP and vomiting  After outpt EGD/dilitation  Trop positive  Cardiac cath done showed 75% mid PDA (small vessel)  Prox RCA with 25%  OM 50%  LVEF normal  Focal wall motion abnormality  ? Effect of spasm   I saw the pt back in March 2019  She was al ittle fatigued at time     Patient says for past couple months more fatigue with walking   Tight with walking in chest   Tired   Current Meds  Medication Sig  . Albuterol Sulfate 108 (90 Base) MCG/ACT AEPB Inhale 2 puffs into the lungs 4 (four) times daily.  Marland Kitchen amLODipine (NORVASC) 10 MG tablet Take 1 tablet by mouth once daily  . aspirin 81 MG chewable tablet Chew 1 tablet (81 mg total) by mouth daily.  Marland Kitchen ezetimibe (ZETIA) 10 MG tablet Take 1 tablet (10 mg total) by mouth daily.  . meloxicam (MOBIC) 15 MG tablet Take 1 tablet (15 mg total) by mouth daily.  . ondansetron (ZOFRAN) 4 MG tablet Take 1 tab by mouth every 6 hours as needed for nausea.  . polyethylene glycol (MIRALAX / GLYCOLAX) packet Take 17 g by mouth 2 (two) times daily. (Patient taking differently: Take 17 g by mouth daily as needed for mild constipation. )  . potassium chloride SA (K-DUR) 20 MEQ tablet Take 1 tablet (20 mEq total) by mouth daily.  . rosuvastatin (CRESTOR) 40 MG tablet TAKE 1 TABLET BY MOUTH EVERY OTHER DAY  . SUMAtriptan (IMITREX) 50 MG tablet Take 50 mg by mouth every 2 (two) hours as needed for migraine. May repeat in 2 hours if headache persists or recurs.  . [DISCONTINUED] potassium chloride SA (K-DUR,KLOR-CON) 20 MEQ tablet Take 1 tablet (20 mEq total) by mouth daily.   Current  Facility-Administered Medications for the 02/12/19 encounter (Office Visit) with Fay Records, MD  Medication  . 0.9 %  sodium chloride infusion     Allergies:   Ace inhibitors, Celebrex [celecoxib], and Diovan [valsartan]   Past Medical History:  Diagnosis Date  . Allergy    allergic rhinitis  . Arthritis   . Atypical chest pain   . Constipation   . Cyst, ovarian   . Diabetes mellitus, type II ([place])   . Endometriosis   . Esophageal stricture   . Gastroparesis   . GERD (gastroesophageal reflux disease)   . Heart murmur   . Hyperlipidemia   . Hypertension   . 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 Iron deficiency anemia   . Migraine headache   . Non-compliance   . Peptic ulcer disease   . Thyroid disease     Past Surgical History:  Procedure Laterality Date  . ABDOMINAL EXPLORATION SURGERY     w/bso   . BREAST BIOPSY    . CARDIAC CATHETERIZATION  2009   mild non obstructive CAD  . CHOLECYSTECTOMY    . COLONOSCOPY    . KNEE SURGERY  2005    left knee  . LEFT HEART CATH AND CORONARY ANGIOGRAPHY N/A 07/17/2017   Procedure: LEFT HEART CATH AND CORONARY ANGIOGRAPHY;  Surgeon: Martinique, Peter M, MD;  Location: [facility];  Service: Cardiovascular;  Laterality: N/A;  . POLYPECTOMY    . TOTAL ABDOMINAL HYSTERECTOMY    . UPPER GASTROINTESTINAL ENDOSCOPY       Social History:  The patient  reports that she quit smoking about 24 years ago. Her smoking use included cigarettes. She started smoking about 41 years ago. She has a 9.00 pack-year smoking history. She has never used smokeless tobacco. She reports current alcohol use. She reports that she does not use drugs.   Family History:  The patient's family history includes Arthritis in her brother; CAD in her brother; Diabetes in her father, maternal grandmother, and sister; Fibromyalgia in her sister and sister; Hypertension in her father and mother; Kidney disease  in her father; Lung cancer in her maternal grandfather; Migraines in her brother and sister; Prostate cancer in her father; Rheum arthritis in her mother.    ROS:  Please see the history of present illness. All other systems are reviewed and  Negative to the above problem except as noted.    PHYSICAL EXAM: VS:  BP (!) 150/90   Pulse 65   Ht 5\' 4"  (1.626 m)   Wt 225 lb 6.4 oz (102.2 kg)   BMI 38.69 kg/m   GEN: Well nourished, well developed, in no acute distress  HEENT: normal  Neck: no JVD, carotid bruits, or masses Cardiac: RRR; no murmurs, rubs, or gallops,no edema  Respiratory:  clear to auscultation bilaterally, normal work of breathing GI: soft, nontender, nondistended, + BS  No hepatomegaly  MS: no deformity Moving all extremities   Skin: warm and dry, no rash Neuro:  Strength and sensation are intact Psych: euthymic mood, full affect   EKG:  EKG is  ordered today.  SR 65 bpm   Occasional PVC   IWMI     Lipid Panel    Component Value Date/Time   CHOL 195 08/26/2017 1112   TRIG 120 08/26/2017 1112   TRIG 103 04/30/2006 0949   HDL 48 08/26/2017 1112   CHOLHDL 4.1 08/26/2017 1112   CHOLHDL 6.7 07/17/2017 1507   VLDL 26 07/17/2017 1507   LDLCALC 123 (H) 08/26/2017 1112   LDLDIRECT 131.0 10/31/2016 1555      Wt Readings from Last 3 Encounters:  02/12/19 225 lb 6.4 oz (102.2 kg)  11/21/18 230 lb 12.8 oz (104.7 kg)  11/04/18 229 lb 12.8 oz (104.2 kg)      ASSESSMENT AND PLAN:  1  CAD  Some tightness with walking   Will add Imdur   Follow  PT has dz of small vessel     2  HL  Profound  LDL last 198  Now on crestor 40  Check today  3  Hx anemia  Check CBC   4  GI  Hx stricture s/p dilitation  5  HTN  BP is increased today  Add imdur to regimen     F/U in 6 months     Current medicines are reviewed at length with the patient today.  The patient does not have concerns regarding medicines.  Signed, [name], MD  02/12/2019 12:11 PM    [facility] [address], [place], [place]  [address] Phone: [phone]; Fax: [phone]

## 2019-02-12 ENCOUNTER — Ambulatory Visit (INDEPENDENT_AMBULATORY_CARE_PROVIDER_SITE_OTHER): Payer: Medicare Other | Admitting: Internal Medicine

## 2019-02-12 ENCOUNTER — Encounter: Payer: Self-pay | Admitting: Internal Medicine

## 2019-02-12 ENCOUNTER — Other Ambulatory Visit: Payer: Self-pay

## 2019-02-12 DIAGNOSIS — I2510 Atherosclerotic heart disease of native coronary artery without angina pectoris: Secondary | ICD-10-CM | POA: Diagnosis not present

## 2019-02-12 MED ORDER — ISOSORBIDE MONONITRATE ER 30 MG PO TB24: 30.0000 mg | ORAL_TABLET | Freq: Every day | ORAL | 3 refills | Status: DC

## 2019-02-12 MED ORDER — POTASSIUM CHLORIDE CRYS ER 20 MEQ PO TBCR: 20.0000 meq | EXTENDED_RELEASE_TABLET | Freq: Every day | ORAL | 11 refills | Status: DC

## 2019-02-12 NOTE — Patient Instructions (Signed)
Medication Instructions:  Your physician has recommended you make the following change in your medication:  1.) isosorbide (IMDUR) 30 mg -take one tablet once a day  If you need a refill on your cardiac medications before your next appointment, please call your pharmacy.   Lab work: Today: bmet, lipids, cbc If you have labs (blood work) drawn today and your tests are completely normal, you will receive your results only by: Marland Kitchen MyChart Message (if you have MyChart) OR . A paper copy in the mail If you have any lab test that is abnormal or we need to change your treatment, we will call you to review the results.  Testing/Procedures: none  Follow-Up: At [facility], you and your health needs are our priority.  As part of our continuing mission to provide you with exceptional heart care, we have created designated Provider Care Teams.  These Care Teams include your primary Cardiologist (physician) and Advanced Practice Providers (APPs -  Physician Assistants and Nurse Practitioners) who all work together to provide you with the care you need, when you need it. You will need a follow up appointment in:  6 months.  Please call our office 2 months in advance to schedule this appointment.  You may see Dr. [name] or one of the following Advanced Practice Providers on your designated Care Team: Richardson Dopp, PA-C [place], [place] . Daune Perch, NP  Any Other Special Instructions Will Be Listed Below (If Applicable). Please call or send MyChart message in about 2 weeks to let Dr. Harrington Challenger know if adding the IMDUR (isosorbide) has helped your symptoms.  If not, the doctor will consider a cardiac ct scan.

## 2019-02-13 LAB — LIPID PANEL
Chol/HDL Ratio: 3.9 ratio (ref 0.0–4.4)
Cholesterol, Total: 204 mg/dL — ABNORMAL HIGH (ref 100–199)
HDL: 52 mg/dL (ref >39)
LDL Calculated: 115 mg/dL — ABNORMAL HIGH (ref 0–99)
Triglycerides: 183 mg/dL — ABNORMAL HIGH (ref 0–149)
VLDL Cholesterol Cal: 37 mg/dL (ref 5–40)

## 2019-02-13 LAB — CBC
Hematocrit: 40 % (ref 34.0–46.6)
Hemoglobin: 12.2 g/dL (ref 11.1–15.9)
MCH: 22.5 pg — ABNORMAL LOW (ref 26.6–33.0)
MCHC: 30.5 g/dL — ABNORMAL LOW (ref 31.5–35.7)
MCV: 74 fL — ABNORMAL LOW (ref 79–97)
Platelets: 205 10*3/uL (ref 150–450)
RBC: 5.43 x10E6/uL — ABNORMAL HIGH (ref 3.77–5.28)
RDW: 14.6 % (ref 11.7–15.4)
WBC: 6.5 10*3/uL (ref 3.4–10.8)

## 2019-02-13 LAB — BASIC METABOLIC PANEL
BUN/Creatinine Ratio: 13 (ref 12–28)
BUN: 10 mg/dL (ref 8–27)
CO2: 25 mmol/L (ref 20–29)
Calcium: 9.6 mg/dL (ref 8.7–10.3)
Chloride: 104 mmol/L (ref 96–106)
Creatinine, Ser: 0.79 mg/dL (ref 0.57–1.00)
GFR calc Af Amer: 94 mL/min/{1.73_m2} (ref >59)
GFR calc non Af Amer: 82 mL/min/{1.73_m2} (ref >59)
Glucose: 102 mg/dL — ABNORMAL HIGH (ref 65–99)
Potassium: 4.3 mmol/L (ref 3.5–5.2)
Sodium: 143 mmol/L (ref 134–144)

## 2019-02-16 ENCOUNTER — Other Ambulatory Visit: Payer: Self-pay | Admitting: *Deleted

## 2019-02-16 DIAGNOSIS — E785 Hyperlipidemia, unspecified: Secondary | ICD-10-CM

## 2019-02-16 DIAGNOSIS — I2510 Atherosclerotic heart disease of native coronary artery without angina pectoris: Secondary | ICD-10-CM

## 2019-02-24 ENCOUNTER — Telehealth: Payer: Self-pay | Admitting: Internal Medicine

## 2019-02-24 NOTE — Telephone Encounter (Signed)
Stop imdur Try low dose Toprol 25 mg   Will give more fllling time for heart and vessls

## 2019-02-24 NOTE — Telephone Encounter (Signed)
Pt is calling to inform Dr. Harrington Challenger that ever since she started her on Imdur on 8/20, her symptoms have pretty much stayed the same, and now she has less energy and feels more fatigued.  Pt states she is usually on the go, able to do things like mow the yard,and do her regular household chores. Pt states since she started this med, she has zero energy to complete these tasks.  Pt states that Dr. Harrington Challenger told her at her last OV on 8/20, if her symptoms have not improved with Imdur, then she will consider some sort of testing to further evaluate.  Pt denies chest pain, sob, doe, n/v, pre-syncopal or syncopal episodes at this time.  Pt would like for Dr. Harrington Challenger to advise on what she should do going forward. Informed the pt that I will route this message to both Dr. Harrington Challenger and [facility] RN for further review, recommendation, and follow-up with this pt thereafter.  Pt verbalized understanding and agrees with this plan.

## 2019-02-24 NOTE — Telephone Encounter (Signed)
New message   Patient states that she is extremely tired and has no energy. Please call to discuss.

## 2019-02-25 MED ORDER — METOPROLOL SUCCINATE ER 25 MG PO TB24: 25.0000 mg | ORAL_TABLET | Freq: Every day | ORAL | 3 refills | Status: DC

## 2019-02-25 NOTE — Telephone Encounter (Signed)
Friday night was sitting at dinner with daughter when she got a sharp pain in her back near shoulder blades.  Went home and put heat on it.  Took [employer].  Pain came back Sunday sitting at church.  Got a little SOB both times with it.   Little sore in middle part of back but no further bad pain.    She will pick up Toprol XL and start taking daily and will stop IMDUR. Asked her to call back if she continues to feel fatigued, no energy.

## 2019-02-26 NOTE — Telephone Encounter (Signed)
Agree with plan Based on cath recomm medical Rx Not clear even that symtpoms were from the heart

## 2019-03-03 ENCOUNTER — Telehealth: Payer: Self-pay | Admitting: Pharmacist

## 2019-03-03 NOTE — Telephone Encounter (Signed)
Attempting to call patient and move appointment on Friday to the afternoon as patient was overbooked. No answer and could not leave message

## 2019-03-04 NOTE — Telephone Encounter (Signed)
Called pt, unable to leave voicemail

## 2019-03-05 NOTE — Telephone Encounter (Signed)
Spoke with patient and moved appointment to 11:30

## 2019-03-06 ENCOUNTER — Encounter: Payer: Self-pay | Admitting: Pharmacist

## 2019-03-06 ENCOUNTER — Ambulatory Visit: Payer: Medicare Other

## 2019-03-06 ENCOUNTER — Ambulatory Visit (INDEPENDENT_AMBULATORY_CARE_PROVIDER_SITE_OTHER): Payer: Medicare Other | Admitting: Pharmacist

## 2019-03-06 ENCOUNTER — Other Ambulatory Visit: Payer: Self-pay

## 2019-03-06 VITALS — BP 140/80

## 2019-03-06 DIAGNOSIS — I10 Essential (primary) hypertension: Secondary | ICD-10-CM

## 2019-03-06 DIAGNOSIS — E785 Hyperlipidemia, unspecified: Secondary | ICD-10-CM

## 2019-03-06 NOTE — Patient Instructions (Addendum)
Goals from today's visit  1. Try to increase the days you walk to 5 days per week. Goal would be to walk at a moderate pace for 30 min, 5 days a week 2. Try to cut back or eliminate soda and juice. Reduce alcohol to 1 drink per day. 3. We will submit a prior authorization for Praluent to your insurance company. We will call you once we have it approved.   Call us at [phone] with any questions or concerns

## 2019-03-06 NOTE — Progress Notes (Signed)
Patient ID: [name]                 DOB: 1961/02/24                    MRN: [id number]     HPI: [name] is a 58 y.o. female patient referred to lipid clinic by Dr. Harrington Challenger. PMH is significant for CAD, DM, HTN, HLD, thyroid disease and GERD.   Patient presents today to discuss PCSK9 inhibitors. Patient reports she hasn't been taking Zetia.   Current Medications: rosuvastatin 40mg  Intolerances: none Risk Factors: CAD, HTN, DM LDL goal: <70  Diet: cereal, boiled egg, bacon sandwich, grilled chicken, salad, fruit/tea smoothie, subway, eats out often, soda, not much sweets, does eat a sandwich every day.   Exercise: walks about 2 miles 3 days a week  Family History: The patient's family history includes Arthritis in her brother; CAD in her brother; Diabetes in her father, maternal grandmother, and sister; Fibromyalgia in her sister and sister; Hypertension in her father and mother; Kidney disease in her father; Lung cancer in her maternal grandfather; Migraines in her brother and sister; Prostate cancer in her father; Rheum arthritis in her mother.   Social History: The patient  reports that she quit smoking about 24 years ago. Her smoking use included cigarettes. She started smoking about 41 years ago. She has a 9.00 pack-year smoking history. She has never used smokeless tobacco. She reports current alcohol use. 2-3 drinks per occasion, usually on weekend. She reports that she does not use drugs.  Labs: TC 204, TG 183, HDL 52, LDL 115 (rosuvastatin 40mg )  Past Medical History:  Diagnosis Date  . Allergy    allergic rhinitis  . Arthritis   . Atypical chest pain   . Constipation   . Cyst, ovarian   . Diabetes mellitus, type II ([place])   . Endometriosis   . Esophageal stricture   . Gastroparesis   . GERD (gastroesophageal reflux disease)   . Heart murmur   . Hyperlipidemia   . Hypertension   . Hypertrophic condition of skin    acrokeratoelastoidosis- s/p 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  1/08- benign  . Iron deficiency anemia   . Migraine headache   . Non-compliance   . Peptic ulcer disease   . Thyroid disease     Current Outpatient Medications on File Prior to Visit  Medication Sig Dispense Refill  . Albuterol Sulfate 108 (90 Base) MCG/ACT AEPB Inhale 2 puffs into the lungs 4 (four) times daily. 1 each 3  . amLODipine (NORVASC) 10 MG tablet Take 1 tablet by mouth once daily 90 tablet 1  . aspirin 81 MG chewable tablet Chew 1 tablet (81 mg total) by mouth daily.    Marland Kitchen ezetimibe (ZETIA) 10 MG tablet Take 1 tablet (10 mg total) by mouth daily. 90 tablet 3  . meloxicam (MOBIC) 15 MG tablet Take 1 tablet (15 mg total) by mouth daily. 30 tablet 0  . metoprolol succinate (TOPROL XL) 25 MG 24 hr tablet Take 1 tablet (25 mg total) by mouth daily. 90 tablet 3  . ondansetron (ZOFRAN) 4 MG tablet Take 1 tab by mouth every 6 hours as needed for nausea. 100 tablet 3  . polyethylene glycol (MIRALAX / GLYCOLAX) packet Take 17 g by mouth 2 (two) times daily. (Patient taking differently: Take 17 g by mouth daily as needed for mild constipation. ) 14 each 0  . potassium chloride SA (K-DUR) 20 MEQ tablet Take  1 tablet (20 mEq total) by mouth daily. 30 tablet 11  . rosuvastatin (CRESTOR) 40 MG tablet TAKE 1 TABLET BY MOUTH EVERY OTHER DAY 45 tablet 0  . SUMAtriptan (IMITREX) 50 MG tablet Take 50 mg by mouth every 2 (two) hours as needed for migraine. May repeat in 2 hours if headache persists or recurs.    . [DISCONTINUED] atorvastatin (LIPITOR) 40 MG tablet Take 1 tablet (40 mg total) by mouth daily. 30 tablet 3   Current Facility-Administered Medications on File Prior to Visit  Medication Dose Route Frequency Provider Last Rate Last Dose  . 0.9 %  sodium chloride infusion  500 mL Intravenous Once Armbruster, Carlota Raspberry, MD        Allergies  Allergen Reactions  . Ace Inhibitors     REACTION: chronic cough  . Celebrex [Celecoxib] Other (See Comments)    "makes me bleed"  . Diovan  [Valsartan]     angioedema    Assessment/Plan:  1. Hyperlipidemia - LDL above goal of <70. Patient on high intensity statin. Will start Praluent 75mg  q 14 days. Patient in agreement to start. Reviewed side effects and injection technique. Will submit PA to insurance.  Reviewed diet and exercise in detail with patient. Patient TG are also mildly elevated. Encouraged a reduction/ perferably elimination of soda and juice from the diet. Also encoruage to limit to 1 alcoholic drink per occassion.  Gave her a goal of increasing her walking to 5 days a week. Explained that this will also help with her blood pressure which was above goal today in clinic.   Thank you,  Ramond Dial, Pharm.D, [place]  [id number] [place], [place] [address]  Phone: [phone]; Fax: [phone]

## 2019-03-10 ENCOUNTER — Other Ambulatory Visit: Payer: Self-pay | Admitting: Family

## 2019-03-10 ENCOUNTER — Telehealth: Payer: Self-pay | Admitting: Pharmacist

## 2019-03-10 DIAGNOSIS — E785 Hyperlipidemia, unspecified: Secondary | ICD-10-CM

## 2019-03-10 MED ORDER — PRALUENT 75 MG/ML ~~LOC~~ SOAJ: 1.0000 "pen " | SUBCUTANEOUS | 11 refills | Status: DC

## 2019-03-10 NOTE — Telephone Encounter (Addendum)
Praluent prior authorization has been approved through 03/08/20. Copay card activated and faxed to pharmacy - confirmed copay is $0. Called pt who is aware, she feels comfortable giving injections at home. Scheduled f/u lab work after 4 injections.

## 2019-03-26 NOTE — Telephone Encounter (Signed)
Follow up  Patient would like to know if she needs to still be taking the rosuvastatin (CRESTOR) 40 MG tablet since she is now doing the injections. Please give patient a call back to discuss.

## 2019-03-26 NOTE — Telephone Encounter (Signed)
Returned call to pt and advised her to continue on her rosuvastatin in addition to her Praluent. She had no further questions.

## 2019-04-03 ENCOUNTER — Other Ambulatory Visit: Payer: Self-pay

## 2019-04-06 ENCOUNTER — Emergency Department (HOSPITAL_BASED_OUTPATIENT_CLINIC_OR_DEPARTMENT_OTHER): Payer: Medicare Other

## 2019-04-06 ENCOUNTER — Other Ambulatory Visit: Payer: Self-pay

## 2019-04-06 ENCOUNTER — Emergency Department (HOSPITAL_BASED_OUTPATIENT_CLINIC_OR_DEPARTMENT_OTHER)
Admission: EM | Admit: 2019-04-06 | Discharge: 2019-04-06 | Disposition: A | Discharge status: Home / Self Care | Payer: Medicare Other | Attending: Emergency Medicine | Admitting: Emergency Medicine

## 2019-04-06 ENCOUNTER — Encounter (HOSPITAL_BASED_OUTPATIENT_CLINIC_OR_DEPARTMENT_OTHER): Payer: Self-pay

## 2019-04-06 ENCOUNTER — Ambulatory Visit: Payer: Self-pay

## 2019-04-06 DIAGNOSIS — R079 Chest pain, unspecified: Secondary | ICD-10-CM | POA: Diagnosis present

## 2019-04-06 DIAGNOSIS — Z79899 Other long term (current) drug therapy: Secondary | ICD-10-CM | POA: Diagnosis not present

## 2019-04-06 DIAGNOSIS — Z7982 Long term (current) use of aspirin: Secondary | ICD-10-CM | POA: Diagnosis not present

## 2019-04-06 DIAGNOSIS — I10 Essential (primary) hypertension: Secondary | ICD-10-CM | POA: Diagnosis not present

## 2019-04-06 DIAGNOSIS — E039 Hypothyroidism, unspecified: Secondary | ICD-10-CM | POA: Insufficient documentation

## 2019-04-06 DIAGNOSIS — R0789 Other chest pain: Secondary | ICD-10-CM | POA: Diagnosis not present

## 2019-04-06 DIAGNOSIS — E119 Type 2 diabetes mellitus without complications: Secondary | ICD-10-CM | POA: Diagnosis not present

## 2019-04-06 DIAGNOSIS — Z87891 Personal history of nicotine dependence: Secondary | ICD-10-CM | POA: Insufficient documentation

## 2019-04-06 LAB — TROPONIN I (HIGH SENSITIVITY)
Troponin I (High Sensitivity): 2 ng/L (ref <18)
Troponin I (High Sensitivity): <2 ng/L (ref <18)

## 2019-04-06 LAB — BASIC METABOLIC PANEL
Anion gap: 9 (ref 5–15)
BUN: 18 mg/dL (ref 6–20)
CO2: 25 mmol/L (ref 22–32)
Calcium: 9.3 mg/dL (ref 8.9–10.3)
Chloride: 106 mmol/L (ref 98–111)
Creatinine, Ser: 0.78 mg/dL (ref 0.44–1.00)
GFR calc Af Amer: >60 mL/min (ref >60)
GFR calc non Af Amer: >60 mL/min (ref >60)
Glucose, Bld: 107 mg/dL — ABNORMAL HIGH (ref 70–99)
Potassium: 3.9 mmol/L (ref 3.5–5.1)
Sodium: 140 mmol/L (ref 135–145)

## 2019-04-06 LAB — PREGNANCY, URINE: Preg Test, Ur: NEGATIVE

## 2019-04-06 LAB — CBC
HCT: 38.2 % (ref 36.0–46.0)
Hemoglobin: 11.2 g/dL — ABNORMAL LOW (ref 12.0–15.0)
MCH: 22.4 pg — ABNORMAL LOW (ref 26.0–34.0)
MCHC: 29.3 g/dL — ABNORMAL LOW (ref 30.0–36.0)
MCV: 76.4 fL — ABNORMAL LOW (ref 80.0–100.0)
Platelets: 258 10*3/uL (ref 150–400)
RBC: 5 MIL/uL (ref 3.87–5.11)
RDW: 15.3 % (ref 11.5–15.5)
WBC: 8.2 10*3/uL (ref 4.0–10.5)
nRBC: 0 % (ref 0.0–0.2)

## 2019-04-06 MED ORDER — SODIUM CHLORIDE 0.9% FLUSH
3.0000 mL | Freq: Once | INTRAVENOUS | Status: AC
  Administered 2019-04-06: 3 mL via INTRAVENOUS
  Filled 2019-04-06: qty 3

## 2019-04-06 MED ORDER — LIDOCAINE 5 % EX PTCH
1.0000 | MEDICATED_PATCH | CUTANEOUS | Status: DC
  Filled 2019-04-06: qty 1

## 2019-04-06 MED ORDER — LIDO-CAPSAICIN-MEN-METHYL SAL 0.5-0.035-5-20 % EX PTCH: 1.0000 | MEDICATED_PATCH | Freq: Two times a day (BID) | CUTANEOUS | 0 refills | Status: DC | PRN

## 2019-04-06 MED ORDER — CYCLOBENZAPRINE HCL 10 MG PO TABS: 10.0000 mg | ORAL_TABLET | Freq: Two times a day (BID) | ORAL | 0 refills | Status: DC | PRN

## 2019-04-06 MED ORDER — DICLOFENAC SODIUM 1 % TD GEL: 2.0000 g | Freq: Four times a day (QID) | TRANSDERMAL | 0 refills | Status: DC

## 2019-04-06 MED ORDER — KETOROLAC TROMETHAMINE 30 MG/ML IJ SOLN
15.0000 mg | Freq: Once | INTRAMUSCULAR | Status: AC
  Administered 2019-04-06: 15 mg via INTRAVENOUS
  Filled 2019-04-06: qty 1

## 2019-04-06 NOTE — Discharge Instructions (Addendum)
Use Voltaren gel and lidocaine patches for pain.  May use Tylenol at home.  Please call your primary care provider and cardiologist tomorrow morning to arrange appointments within the week.  Use muscle relaxer at bedtime as needed for pain.

## 2019-04-06 NOTE — ED Triage Notes (Addendum)
Pt c/o CP x 2 days-denies fever/flu like sx-NAD-steady gait

## 2019-04-06 NOTE — Telephone Encounter (Signed)
Patient called stating that she has chest pain mid chest.  She denies that it travels but does say her fingers tingle with the pain. She states that the pain started yesterday and she treated herself for gas. The symptoms woke her up from sleep last night. She states that the pain last about 2 minutes. No nausea, no sweats, no SOB.  She feels that activity does not make it worse. She has multiple risk factors and sees a cardiologist Dr Harrington Challenger.  She has seen her in the past several months. Per protocol patient will go to ER for evaluation of her chest pain. Care advice read to patient.  She verbalized understanding of all instructions. Note will be routed to office.  Reason for Disposition . [1] Chest pain lasts > 5 minutes AND [2] occurred in past 3 days (72 hours)  Answer Assessment - Initial Assessment Questions 1. LOCATION: "Where does it hurt?"       Mid chest 2. RADIATION: "Does the pain go anywhere else?" (e.g., into neck, jaw, arms, back)    Fingers tigling 3. ONSET: "When did the chest pain begin?" (Minutes, hours or days)     yesterday 4. PATTERN "Does the pain come and go, or has it been constant since it started?"  "Does it get worse with exertion?"      same 5. DURATION: "How long does it last" (e.g., seconds, minutes, hours)    minutes2 6. SEVERITY: "How bad is the pain?"  (e.g., Scale 1-10; mild, moderate, or severe)    - MILD (1-3): doesn't interfere with normal activities     - MODERATE (4-7): interferes with normal activities or awakens from sleep    - SEVERE (8-10): excruciating pain, unable to do any normal activities       moderate 7. CARDIAC RISK FACTORS: "Do you have any history of heart problems or risk factors for heart disease?" (e.g., angina, prior heart attack; diabetes, high blood pressure, high cholesterol, smoker, or strong family history of heart disease)     [facility] 8. PULMONARY RISK FACTORS: "Do you have any history of lung disease?"  (e.g.,  blood clots in lung, asthma, emphysema, birth control pills)    no 9. CAUSE: "What do you think is causing the chest pain?"    unsure 10. OTHER SYMPTOMS: "Do you have any other symptoms?" (e.g., dizziness, nausea, vomiting, sweating, fever, difficulty breathing, cough)       none 11. PREGNANCY: "Is there any chance you are pregnant?" "When was your last menstrual period?"       N/A  Protocols used: CHEST PAIN-A-AH

## 2019-04-06 NOTE — ED Notes (Signed)
ED Provider at bedside. 

## 2019-04-06 NOTE — ED Provider Notes (Signed)
[facility] Provider Note   CSN: [id number] Arrival date & time: 04/06/19  1709     History   Chief Complaint Chief Complaint  Patient presents with  . Chest Pain    HPI [name] is a 58 y.o. female history of DM, HTN, HLD and former smoker presenting today with 2 days of chest pain.   Patient states chest pain is left-sided sharp, stabbing and constant for 2 days with no aggravating or mitigating factors.  Patient states that it feels tight and there is pressure on her chest as well.  Patient states that pain is well localized and does not radiate.  Endorses some left arm tingling but no pain or weakness.  Patient denies any shortness of breath, vomiting, nausea, diaphoresis, headache, dizziness, leg swelling.   Patient is followed by cardiology since her hospitalization January '19 where she had reassuring LHC, CT chest and echocardiogram. Followed up with cardiology d/t HLD. CP thought to be due to recent EGD w/ dilation.   On review of EMR. Patient was hospitalized for chest pain and troponin elevation 1 year ago Patient had left heart cath 07/17/2017 RPDA 75% stenosed, ost ramus 50% stenosis, RCA 25% stenosis. Diagnosed with small vessel disease and followed up with cardiology last seen 02/12/2019 put on imdur.       HPI  Past Medical History:  Diagnosis Date  . Allergy    allergic rhinitis  . Arthritis   . Atypical chest pain   . Constipation   . Cyst, ovarian   . Diabetes mellitus, type II ([place])   . Endometriosis   . Esophageal stricture   . Gastroparesis   . GERD (gastroesophageal reflux disease)   . Heart murmur   . Hyperlipidemia   . Hypertension   . 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 Iron deficiency anemia   . Migraine headache   . Non-compliance   . Peptic ulcer disease   . Thyroid disease     Patient Active Problem List   Diagnosis Date Noted  .  Colitis 02/04/2018  . Aortic atherosclerosis ([place]) 07/17/2017  . Chest pain 07/16/2017  . Insomnia 01/12/2017  . Hyperglycemia 10/07/2014  . HTN (hypertension) 09/18/2012  . Breast cyst 06/09/2012  . Hyperthyroidism 02/29/2012  . Unspecified constipation 04/18/2011  . Can't get food down 04/18/2011  . Ulnar neuropathy 03/30/2011  . Low back pain 10/18/2010  . Fibromyalgia 07/20/2009  . Hyperlipidemia 05/26/2009  . Vitamin D deficiency 05/23/2009  . Iron deficiency anemia due to chronic blood loss 10/25/2008  . GERD 10/25/2008  . LIVER HEMANGIOMA 01/01/2008  . ESOPHAGEAL STRICTURE 01/01/2008  . Gastroparesis 01/01/2008  . ALLERGIC RHINITIS 12/08/2007  . Borderline type 2 diabetes mellitus 10/31/2007  . Migraine headache 08/11/2007  . HIATAL HERNIA 08/11/2007  . OVARIAN CYST 08/11/2007  . Essential hypertension, benign 05/06/2007    Past Surgical History:  Procedure Laterality Date  . ABDOMINAL EXPLORATION SURGERY     w/bso   . BREAST BIOPSY    . CARDIAC CATHETERIZATION  2009   mild non obstructive CAD  . CHOLECYSTECTOMY    . COLONOSCOPY    . KNEE SURGERY  2005    left knee  . LEFT HEART CATH AND CORONARY ANGIOGRAPHY N/A 07/17/2017   Procedure: LEFT HEART CATH AND CORONARY ANGIOGRAPHY;  Surgeon: Martinique, Peter M, MD;  Location: [facility];  Service: Cardiovascular;  Laterality: N/A;  . POLYPECTOMY    .  TOTAL ABDOMINAL HYSTERECTOMY    . UPPER GASTROINTESTINAL ENDOSCOPY       OB History   No obstetric history on file.      Home Medications    Prior to Admission medications   Medication Sig Start Date End Date Taking? Authorizing Provider  Albuterol Sulfate 108 (90 Base) MCG/ACT AEPB Inhale 2 puffs into the lungs 4 (four) times daily. 09/01/18   [name], NP  Alirocumab (PRALUENT) 75 MG/ML SOAJ Inject 1 pen into the skin every 14 (fourteen) days. 03/10/19   Fay Records, MD  amLODipine (NORVASC) 10 MG tablet Take 1 tablet by mouth once daily 10/24/18    [name], NP  aspirin 81 MG chewable tablet Chew 1 tablet (81 mg total) by mouth daily. Patient not taking: Reported on 03/06/2019 07/19/17   Samuella Cota, MD  meloxicam (MOBIC) 15 MG tablet Take 1 tablet (15 mg total) by mouth daily. Patient not taking: Reported on 03/06/2019 06/20/18   Shelda Pal, DO  metoprolol succinate (TOPROL XL) 25 MG 24 hr tablet Take 1 tablet (25 mg total) by mouth daily. 02/25/19   Fay Records, MD  ondansetron (ZOFRAN) 4 MG tablet Take 1 tab by mouth every 6 hours as needed for nausea. Patient not taking: Reported on 03/06/2019 02/26/18   Esterwood, Amy S, PA-C  polyethylene glycol (MIRALAX / GLYCOLAX) packet Take 17 g by mouth 2 (two) times daily. Patient taking differently: Take 17 g by mouth daily as needed for mild constipation.  02/08/18   Lavina Hamman, MD  potassium chloride SA (K-DUR) 20 MEQ tablet Take 1 tablet (20 mEq total) by mouth daily. Patient not taking: Reported on 03/06/2019 02/12/19   Fay Records, MD  rosuvastatin (CRESTOR) 40 MG tablet TAKE 1 TABLET BY MOUTH EVERY OTHER DAY 03/11/19   [name], NP  SUMAtriptan (IMITREX) 50 MG tablet Take 50 mg by mouth every 2 (two) hours as needed for migraine. May repeat in 2 hours if headache persists or recurs.    [provider]  atorvastatin (LIPITOR) 40 MG tablet Take 1 tablet (40 mg total) by mouth daily. 08/15/15 05/30/18  [name], NP    Family History Family History  Problem Relation Age of Onset  . Hypertension Mother   . Rheum arthritis Mother   . Hypertension Father   . Diabetes Father   . Prostate cancer Father   . Kidney disease Father   . Diabetes Sister   . Fibromyalgia Sister   . Diabetes Maternal Grandmother   . Lung cancer Maternal Grandfather   . Arthritis Brother   . Migraines Brother   . CAD Brother   . Fibromyalgia Sister   . Migraines Sister   . Colon cancer Neg Hx   . Thyroid disease Neg Hx   . Colon polyps Neg Hx      Social History Social History   Tobacco Use  . Smoking status: Former Smoker    Packs/day: 0.50    Years: 18.00    Pack years: 9.00    Types: Cigarettes    Start date: 07/29/1977    Quit date: 06/25/1994    Years since quitting: 24.7  . Smokeless tobacco: Never Used  . Tobacco comment: quit 19 years ago  Substance Use Topics  . Alcohol use: Yes    Alcohol/week: 0.0 standard drinks    Comment: social drinker  . Drug use: No     Allergies   Ace inhibitors, Celebrex [celecoxib], and  Diovan [valsartan]   Review of Systems Review of Systems  All other systems reviewed and are negative.    Physical Exam Updated Vital Signs BP (!) 162/82 (BP Location: Left Arm)   Pulse 60   Temp 98.9 F (37.2 C) (Oral)   Resp 20   Ht 5\' 4"  (1.626 m)   Wt 102.1 kg   SpO2 100%   BMI 38.62 kg/m   Physical Exam Vitals signs and nursing note reviewed.  Constitutional:      General: She is not in acute distress. HENT:     Head: Normocephalic and atraumatic.     Nose: Nose normal.  Eyes:     General: No scleral icterus. Neck:     Musculoskeletal: Normal range of motion.  Cardiovascular:     Rate and Rhythm: Normal rate and regular rhythm.     Pulses: Normal pulses.     Heart sounds: Normal heart sounds.  Pulmonary:     Effort: Pulmonary effort is normal. No respiratory distress.     Breath sounds: No wheezing.     Comments: Patient speaks in full sentences no accessory muscle usage. Chest:     Comments: Anterior chest wall tenderness on the left of the sternum.  This pain reproduces patient's CC of chest pain. Abdominal:     Palpations: Abdomen is soft.     Tenderness: There is no abdominal tenderness.  Musculoskeletal:     Right lower leg: No edema.     Left lower leg: No edema.  Skin:    General: Skin is warm and dry.     Capillary Refill: Capillary refill takes less than 2 seconds.  Neurological:     Mental Status: She is alert. Mental status is at baseline.   Psychiatric:        Mood and Affect: Mood normal.        Behavior: Behavior normal.      ED Treatments / Results  Labs (all labs ordered are listed, but only abnormal results are displayed) Labs Reviewed  BASIC METABOLIC PANEL  CBC  PREGNANCY, URINE  TROPONIN I (HIGH SENSITIVITY)    EKG EKG Interpretation  Date/Time:  Monday April 06 2019 17:18:49 EDT Ventricular Rate:  65 PR Interval:  164 QRS Duration: 86 QT Interval:  432 QTC Calculation: 449 R Axis:   -8 Text Interpretation:  Normal sinus rhythm Anterior infarct , age undetermined Abnormal ECG Confirmed by Quintella Reichert [phone]) on 04/06/2019 5:26:26 PM   Radiology Dg Chest 2 View  Result Date: 04/06/2019 CLINICAL DATA:  58 year old female with chest pain. EXAM: CHEST - 2 VIEW COMPARISON:  Chest radiograph dated 09/01/2018 FINDINGS: The heart size and mediastinal contours are within normal limits. Both lungs are clear. The visualized skeletal structures are unremarkable. IMPRESSION: No active cardiopulmonary disease. Electronically Signed   By: Anner Crete M.D.   On: 04/06/2019 17:41    Procedures Procedures (including critical care time)  Medications Ordered in ED Medications  sodium chloride flush (NS) 0.9 % injection 3 mL (has no administration in time range)     Initial Impression / Assessment and Plan / ED Course  I have reviewed the triage vital signs and the nursing notes.  Pertinent labs & imaging results that were available during my care of the patient were reviewed by me and considered in my medical decision making (see chart for details).        Patient is 59 year old female with multiple risk factors for ACS presenting with atypical presentation  of chest pain that is sharp and stabbing and has reproducible chest wall tenderness.  However because of her risk factors including DM, HTN, HLD and family history patient received EKG and delta troponins.  EKG was nonischemic and delta troponin  was unchanged and less than 4 patient was discharged with close follow-up with cardiology.  Low suspicion for cardiac etiology as physical exam is notable for reproducible chest wall tenderness.  However patient has no explanation for her muscular tenderness and no heavy lifting to explain the symptom.  She also denies any trauma to the area.  Given topical Voltaren and lidocaine patches with Flexeril for use in alleviating muscular pain.  Patient given strict return precautions.  Doubt pulmonary embolism as patient has no history of clot, no active cancer, no recent surgery or immobilization, no calf swelling or tenderness and more likely alternative explanation of muscular etiology of chest wall pain.  Patient not tachycardic or hypoxemic during exam.  Patient discharged home with strict return precautions and close follow-up with cardiology and primary care.       Final Clinical Impressions(s) / ED Diagnoses   Final diagnoses:  None    ED Discharge Orders    None       Tedd Sias, [place] 04/07/19 0120    Quintella Reichert, MD 04/08/19 1255

## 2019-04-06 NOTE — Telephone Encounter (Signed)
FYI

## 2019-04-06 NOTE — ED Notes (Signed)
Pt requesting pain med- edp informed. See [facility]

## 2019-04-07 ENCOUNTER — Ambulatory Visit (INDEPENDENT_AMBULATORY_CARE_PROVIDER_SITE_OTHER): Payer: Medicare Other | Admitting: Endocrinology

## 2019-04-07 ENCOUNTER — Encounter: Payer: Self-pay | Admitting: Endocrinology

## 2019-04-07 VITALS — BP 110/60 | HR 62 | Ht 64.0 in | Wt 225.2 lb

## 2019-04-07 DIAGNOSIS — E0590 Thyrotoxicosis, unspecified without thyrotoxic crisis or storm: Secondary | ICD-10-CM

## 2019-04-07 LAB — T4, FREE: Free T4: 0.77 ng/dL (ref 0.60–1.60)

## 2019-04-07 LAB — TSH: TSH: 2.87 u[IU]/mL (ref 0.35–4.50)

## 2019-04-07 NOTE — Progress Notes (Signed)
Subjective:    Patient ID: [name], female    DOB: [birth year], 58 y.o.   MRN: [id number]  HPI Pt returns for f/u of hyperthyroidism (dx'ed 2012; she chose tapazole rx; she has never had thyroid imaging).  She as in ER yesterday, with CP.  It is better now.  she denies palpitations and tremor.   Past Medical History:  Diagnosis Date  . Allergy    allergic rhinitis  . Arthritis   . Atypical chest pain   . Constipation   . Cyst, ovarian   . Diabetes mellitus, type II ([place])   . Endometriosis   . Esophageal stricture   . Gastroparesis   . GERD (gastroesophageal reflux disease)   . Heart murmur   . Hyperlipidemia   . Hypertension   . 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 Iron deficiency anemia   . Migraine headache   . Non-compliance   . Peptic ulcer disease   . Thyroid disease     Past Surgical History:  Procedure Laterality Date  . ABDOMINAL EXPLORATION SURGERY     w/bso   . BREAST BIOPSY    . CARDIAC CATHETERIZATION  2009   mild non obstructive CAD  . CHOLECYSTECTOMY    . COLONOSCOPY    . KNEE SURGERY  2005    left knee  . LEFT HEART CATH AND CORONARY ANGIOGRAPHY N/A 07/17/2017   Procedure: LEFT HEART CATH AND CORONARY ANGIOGRAPHY;  Surgeon: Martinique, Peter M, MD;  Location: [facility];  Service: Cardiovascular;  Laterality: N/A;  . POLYPECTOMY    . TOTAL ABDOMINAL HYSTERECTOMY    . UPPER GASTROINTESTINAL ENDOSCOPY      Social History   Socioeconomic History  . Marital status: Widowed    Spouse name: Not on file  . Number of children: 2  . Years of education: Not on file  . Highest education level: Not on file  Occupational History  . Occupation: DISABILITY    Employer: UNEMPLOYED  Social Needs  . Financial resource strain: Not on file  . Food insecurity    Worry: Not on file    Inability: Not on file  . Transportation needs    Medical: Not on file    Non-medical: Not on file   Tobacco Use  . Smoking status: Former Smoker    Packs/day: 0.50    Years: 18.00    Pack years: 9.00    Types: Cigarettes    Start date: 07/29/1977    Quit date: 06/25/1994    Years since quitting: 24.8  . Smokeless tobacco: Never Used  . Tobacco comment: quit 19 years ago  Substance and Sexual Activity  . Alcohol use: Yes    Alcohol/week: 0.0 standard drinks    Comment: social drinker  . Drug use: No  . Sexual activity: Not Currently  Lifestyle  . Physical activity    Days per week: Not on file    Minutes per session: Not on file  . Stress: Not on file  Relationships  . Social Herbalist on phone: Not on file    Gets together: Not on file    Attends religious service: Not on file    Active member of club or organization: Not on file    Attends meetings of clubs or organizations: Not on file    Relationship status: Not on file  . Intimate partner violence    Fear of current  or ex partner: Not on file    Emotionally abused: Not on file    Physically abused: Not on file    Forced sexual activity: Not on file  Other Topics Concern  . Not on file  Social History Narrative   Widowed   Has 2 grown children.  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   Former Smoker Quit tobacco in 1996.  She was a pack a day smoker for approximately 10 years.   Alcohol use-yes: Social    Daily Caffeine Use:6 pack of pepsi daily     Illicit Drug Use - no    Patient does not get regular exercise.       Smoking Status:  quit    Current Outpatient Medications on File Prior to Visit  Medication Sig Dispense Refill  . Albuterol Sulfate 108 (90 Base) MCG/ACT AEPB Inhale 2 puffs into the lungs 4 (four) times daily. 1 each 3  . Alirocumab (PRALUENT) 75 MG/ML SOAJ Inject 1 pen into the skin every 14 (fourteen) days. 2 pen 11  . amLODipine (NORVASC) 10 MG tablet Take 1 tablet by mouth once daily 90 tablet 1  . aspirin 81 MG chewable tablet Chew 1  tablet (81 mg total) by mouth daily.    . cyclobenzaprine (FLEXERIL) 10 MG tablet Take 1 tablet (10 mg total) by mouth 2 (two) times daily as needed for muscle spasms. 20 tablet 0  . diclofenac sodium (VOLTAREN) 1 % GEL Apply 2 g topically 4 (four) times daily. 100 g 0  . Lido-Capsaicin-Men-Methyl Sal 0.5-0.035-5-20 % PTCH Apply 1 patch topically every 12 (twelve) hours as needed. 30 patch 0  . meloxicam (MOBIC) 15 MG tablet Take 1 tablet (15 mg total) by mouth daily. 30 tablet 0  . methimazole (TAPAZOLE) 5 MG tablet Take 5 mg by mouth daily.    . metoprolol succinate (TOPROL XL) 25 MG 24 hr tablet Take 1 tablet (25 mg total) by mouth daily. 90 tablet 3  . ondansetron (ZOFRAN) 4 MG tablet Take 1 tab by mouth every 6 hours as needed for nausea. 100 tablet 3  . polyethylene glycol (MIRALAX / GLYCOLAX) packet Take 17 g by mouth 2 (two) times daily. (Patient taking differently: Take 17 g by mouth daily as needed for mild constipation. ) 14 each 0  . potassium chloride SA (K-DUR) 20 MEQ tablet Take 1 tablet (20 mEq total) by mouth daily. 30 tablet 11  . rosuvastatin (CRESTOR) 40 MG tablet TAKE 1 TABLET BY MOUTH EVERY OTHER DAY 45 tablet 1  . SUMAtriptan (IMITREX) 50 MG tablet Take 50 mg by mouth every 2 (two) hours as needed for migraine. May repeat in 2 hours if headache persists or recurs.    . [DISCONTINUED] atorvastatin (LIPITOR) 40 MG tablet Take 1 tablet (40 mg total) by mouth daily. 30 tablet 3   Current Facility-Administered Medications on File Prior to Visit  Medication Dose Route Frequency Provider Last Rate Last Dose  . 0.9 %  sodium chloride infusion  500 mL Intravenous Once Armbruster, Carlota Raspberry, MD        Allergies  Allergen Reactions  . Ace Inhibitors     REACTION: chronic cough  . Celebrex [Celecoxib] Other (See Comments)    "makes me bleed"  . Diovan [Valsartan]     angioedema    Family History  Problem Relation Age of Onset  . Hypertension Mother   . Rheum arthritis Mother    .  Hypertension Father   . Diabetes Father   . Prostate cancer Father   . Kidney disease Father   . Diabetes Sister   . Fibromyalgia Sister   . Diabetes Maternal Grandmother   . Lung cancer Maternal Grandfather   . Arthritis Brother   . Migraines Brother   . CAD Brother   . Fibromyalgia Sister   . Migraines Sister   . Colon cancer Neg Hx   . Thyroid disease Neg Hx   . Colon polyps Neg Hx     BP 110/60 (BP Location: Left Arm, Patient Position: Sitting, Cuff Size: Large)   Pulse 62   Ht 5\' 4"  (1.626 m)   Wt 225 lb 3.2 oz (102.2 kg)   SpO2 97%   BMI 38.66 kg/m    Review of Systems Denies fever.     Objective:   Physical Exam VITAL SIGNS:  See vs page GENERAL: no distress NECK: There is no palpable thyroid enlargement.  No thyroid nodule is palpable.  No palpable lymphadenopathy at the anterior neck.  Lab Results  Component Value Date   TSH 2.87 04/07/2019   T4TOTAL 12.3 11/28/2011       Assessment & Plan:  Hyperthyroidism: well-controlled.  Please continue the same medication Gastroparesis: in this setting, pt needs to maintain euthyroidism

## 2019-04-07 NOTE — Patient Instructions (Signed)
Blood tests are requested for you today.  We'll let you know about the results.  If ever you have fever while taking methimazole, stop it and call us, even if the reason is obvious, because of the risk of a rare side-effect.  Please come back for a follow-up appointment in 6 months.   

## 2019-04-21 ENCOUNTER — Telehealth: Payer: Self-pay | Admitting: Family

## 2019-04-21 ENCOUNTER — Other Ambulatory Visit: Payer: Self-pay

## 2019-04-21 ENCOUNTER — Encounter: Payer: Self-pay | Admitting: Family

## 2019-04-21 ENCOUNTER — Ambulatory Visit (HOSPITAL_BASED_OUTPATIENT_CLINIC_OR_DEPARTMENT_OTHER)
Admission: RE | Admit: 2019-04-21 | Discharge: 2019-04-21 | Disposition: A | Discharge status: Home / Self Care | Payer: Medicare Other | Source: Ambulatory Visit | Attending: Family | Admitting: Family

## 2019-04-21 ENCOUNTER — Ambulatory Visit (INDEPENDENT_AMBULATORY_CARE_PROVIDER_SITE_OTHER): Payer: Medicare Other | Admitting: Family

## 2019-04-21 VITALS — BP 152/74 | HR 56 | Temp 96.5°F | Resp 16 | Ht 64.0 in | Wt 227.0 lb

## 2019-04-21 DIAGNOSIS — R0602 Shortness of breath: Secondary | ICD-10-CM

## 2019-04-21 DIAGNOSIS — D649 Anemia, unspecified: Secondary | ICD-10-CM

## 2019-04-21 DIAGNOSIS — R7989 Other specified abnormal findings of blood chemistry: Secondary | ICD-10-CM

## 2019-04-21 LAB — D-DIMER, QUANTITATIVE: D-Dimer, Quant: 2.88 mcg/mL FEU — ABNORMAL HIGH (ref <0.50)

## 2019-04-21 LAB — IRON, TOTAL/TOTAL IRON BINDING CAP
%SAT: 21 % (calc) (ref 16–45)
Iron: 54 ug/dL (ref 45–160)
TIBC: 262 mcg/dL (calc) (ref 250–450)

## 2019-04-21 LAB — IRON: Iron: 56 ug/dL (ref 42–145)

## 2019-04-21 MED ORDER — ROSUVASTATIN CALCIUM 40 MG PO TABS: 40.0000 mg | ORAL_TABLET | ORAL | 1 refills | Status: DC

## 2019-04-21 MED ORDER — ONDANSETRON HCL 4 MG PO TABS: ORAL_TABLET | ORAL | 1 refills | Status: DC

## 2019-04-21 MED ORDER — IOHEXOL 350 MG/ML SOLN
100.0000 mL | Freq: Once | INTRAVENOUS | Status: AC | PRN
  Administered 2019-04-21: 100 mL via INTRAVENOUS

## 2019-04-21 NOTE — Telephone Encounter (Signed)
Reviewed lab work, D dimer positive. Advised pt that she will need to complete CTA to rule out PE today.  She is agreeable to proceed. Will ask coordinator to work on Psychologist, sport and exercise.

## 2019-04-21 NOTE — Patient Instructions (Signed)
Please complete lab work prior to leaving. Keep your upcoming appointment with Dr. Harrington Challenger.

## 2019-04-21 NOTE — Progress Notes (Signed)
Subjective:    Patient ID: [name], female    DOB: [birth year], 58 y.o.   MRN: [id number]  HPI  Patient is a 58 yr old female who presents today for ED follow up. She presented to the ED on 10/12 with chief complaint of chest pain. ED record is reviewed.   Reports that she had some left sided chest pain which occurred while driving.  Reports that she still has some left sided chest tenderness.   She reports some generalized fatigue.   Hyperlipidemia- she is maintained on praluent injection and crestor.   She has follow up on 10/2 with Dr. [name] (cardiology).  Reports + sob.  Reports that sob started the same time that her chest pain started. Denies calf pain/swelling or recent long travel trips.   Review of Systems   See HPI  Past Medical History:  Diagnosis Date  . Allergy    allergic rhinitis  . Arthritis   . Atypical chest pain   . Constipation   . Cyst, ovarian   . Diabetes mellitus, type II ([place])   . Endometriosis   . Esophageal stricture   . Gastroparesis   . GERD (gastroesophageal reflux disease)   . Heart murmur   . Hyperlipidemia   . Hypertension   . 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 Iron deficiency anemia   . Migraine headache   . Non-compliance   . Peptic ulcer disease   . Thyroid disease      Social History   Socioeconomic History  . Marital status: Widowed    Spouse name: Not on file  . Number of children: 2  . Years of education: Not on file  . Highest education level: Not on file  Occupational History  . Occupation: DISABILITY    Employer: UNEMPLOYED  Social Needs  . Financial resource strain: Not on file  . Food insecurity    Worry: Not on file    Inability: Not on file  . Transportation needs    Medical: Not on file    Non-medical: Not on file  Tobacco Use  . Smoking status: Former Smoker    Packs/day: 0.50    Years: 18.00    Pack years: 9.00    Types:  Cigarettes    Start date: 07/29/1977    Quit date: 06/25/1994    Years since quitting: 24.8  . Smokeless tobacco: Never Used  . Tobacco comment: quit 19 years ago  Substance and Sexual Activity  . Alcohol use: Yes    Alcohol/week: 0.0 standard drinks    Comment: social drinker  . Drug use: No  . Sexual activity: Not Currently  Lifestyle  . Physical activity    Days per week: Not on file    Minutes per session: Not on file  . Stress: Not on file  Relationships  . Social Herbalist on phone: Not on file    Gets together: Not on file    Attends religious service: Not on file    Active member of club or organization: Not on file    Attends meetings of clubs or organizations: Not on file    Relationship status: Not on file  . Intimate partner violence    Fear of current or ex partner: Not on file    Emotionally abused: Not on file    Physically abused: Not on file    Forced sexual activity: Not on  file  Other Topics Concern  . Not on file  Social History Narrative   Widowed   Has 2 grown children.  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   Former Smoker Quit tobacco in 1996.  She was a pack a day smoker for approximately 10 years.   Alcohol use-yes: Social    Daily Caffeine Use:6 pack of pepsi daily     Illicit Drug Use - no    Patient does not get regular exercise.       Smoking Status:  quit    Past Surgical History:  Procedure Laterality Date  . ABDOMINAL EXPLORATION SURGERY     w/bso   . BREAST BIOPSY    . CARDIAC CATHETERIZATION  2009   mild non obstructive CAD  . CHOLECYSTECTOMY    . COLONOSCOPY    . KNEE SURGERY  2005    left knee  . LEFT HEART CATH AND CORONARY ANGIOGRAPHY N/A 07/17/2017   Procedure: LEFT HEART CATH AND CORONARY ANGIOGRAPHY;  Surgeon: Martinique, Peter M, MD;  Location: [facility];  Service: Cardiovascular;  Laterality: N/A;  . POLYPECTOMY    . TOTAL ABDOMINAL HYSTERECTOMY    . UPPER  GASTROINTESTINAL ENDOSCOPY      Family History  Problem Relation Age of Onset  . Hypertension Mother   . Rheum arthritis Mother   . Hypertension Father   . Diabetes Father   . Prostate cancer Father   . Kidney disease Father   . Diabetes Sister   . Fibromyalgia Sister   . Diabetes Maternal Grandmother   . Lung cancer Maternal Grandfather   . Arthritis Brother   . Migraines Brother   . CAD Brother   . Fibromyalgia Sister   . Migraines Sister   . Colon cancer Neg Hx   . Thyroid disease Neg Hx   . Colon polyps Neg Hx     Allergies  Allergen Reactions  . Ace Inhibitors     REACTION: chronic cough  . Celebrex [Celecoxib] Other (See Comments)    "makes me bleed"  . Diovan [Valsartan]     angioedema    Current Outpatient Medications on File Prior to Visit  Medication Sig Dispense Refill  . Albuterol Sulfate 108 (90 Base) MCG/ACT AEPB Inhale 2 puffs into the lungs 4 (four) times daily. 1 each 3  . Alirocumab (PRALUENT) 75 MG/ML SOAJ Inject 1 pen into the skin every 14 (fourteen) days. 2 pen 11  . amLODipine (NORVASC) 10 MG tablet Take 1 tablet by mouth once daily 90 tablet 1  . aspirin 81 MG chewable tablet Chew 1 tablet (81 mg total) by mouth daily.    . cyclobenzaprine (FLEXERIL) 10 MG tablet Take 1 tablet (10 mg total) by mouth 2 (two) times daily as needed for muscle spasms. 20 tablet 0  . diclofenac sodium (VOLTAREN) 1 % GEL Apply 2 g topically 4 (four) times daily. 100 g 0  . Lido-Capsaicin-Men-Methyl Sal 0.5-0.035-5-20 % PTCH Apply 1 patch topically every 12 (twelve) hours as needed. 30 patch 0  . meloxicam (MOBIC) 15 MG tablet Take 1 tablet (15 mg total) by mouth daily. 30 tablet 0  . methimazole (TAPAZOLE) 5 MG tablet Take 5 mg by mouth daily.    . metoprolol succinate (TOPROL XL) 25 MG 24 hr tablet Take 1 tablet (25 mg total) by mouth daily. 90 tablet 3  . ondansetron (ZOFRAN) 4 MG tablet Take 1 tab by mouth every 6  hours as needed for nausea. 100 tablet 3  .  polyethylene glycol (MIRALAX / GLYCOLAX) packet Take 17 g by mouth 2 (two) times daily. (Patient taking differently: Take 17 g by mouth daily as needed for mild constipation. ) 14 each 0  . potassium chloride SA (K-DUR) 20 MEQ tablet Take 1 tablet (20 mEq total) by mouth daily. 30 tablet 11  . rosuvastatin (CRESTOR) 40 MG tablet TAKE 1 TABLET BY MOUTH EVERY OTHER DAY 45 tablet 1  . SUMAtriptan (IMITREX) 50 MG tablet Take 50 mg by mouth every 2 (two) hours as needed for migraine. May repeat in 2 hours if headache persists or recurs.    . [DISCONTINUED] atorvastatin (LIPITOR) 40 MG tablet Take 1 tablet (40 mg total) by mouth daily. 30 tablet 3   Current Facility-Administered Medications on File Prior to Visit  Medication Dose Route Frequency Provider Last Rate Last Dose  . 0.9 %  sodium chloride infusion  500 mL Intravenous Once Armbruster, Carlota Raspberry, MD        BP (!) 152/74 (BP Location: Right Arm, Patient Position: Sitting, Cuff Size: Large)   Pulse (!) 56   Temp (!) 96.5 F (35.8 C) (Temporal)   Resp 16   Ht 5\' 4"  (1.626 m)   Wt 227 lb (103 kg)   SpO2 100%   BMI 38.96 kg/m        Objective:   Physical Exam Constitutional:      Appearance: She is well-developed.  Neck:     Musculoskeletal: Neck supple.     Thyroid: No thyromegaly.  Cardiovascular:     Rate and Rhythm: Normal rate and regular rhythm.     Heart sounds: Normal heart sounds. No murmur.  Pulmonary:     Effort: Pulmonary effort is normal. No respiratory distress.     Breath sounds: Normal breath sounds. No wheezing.  Skin:    General: Skin is warm and dry.  Neurological:     Mental Status: She is alert and oriented to person, place, and time.  Psychiatric:        Behavior: Behavior normal.        Thought Content: Thought content normal.        Judgment: Judgment normal.           Assessment & Plan:  Atypical chest pain- had negative troponins in ED.  Advised pt to keep her upcoming appointment with  cardiology. I did send a d dimer to screen for PE given her additional c/o sob. If positive will need CTA to rule out PE.    Fatigue- etiology unclear.  TSH WNL and mild anemia is at baseline.  ? Stress. Will monitor. If symptoms persist might consider OSA work up.   Anemia- obtain ferritin, iron, TIBC.

## 2019-04-23 LAB — FERRITIN: Ferritin: 659.1 ng/mL — ABNORMAL HIGH (ref 10.0–291.0)

## 2019-04-27 ENCOUNTER — Other Ambulatory Visit: Payer: Self-pay

## 2019-04-27 ENCOUNTER — Other Ambulatory Visit: Payer: Medicare Other | Admitting: *Deleted

## 2019-04-27 DIAGNOSIS — E785 Hyperlipidemia, unspecified: Secondary | ICD-10-CM

## 2019-04-27 LAB — HEPATIC FUNCTION PANEL
ALT: 22 IU/L (ref 0–32)
AST: 18 IU/L (ref 0–40)
Albumin: 4.3 g/dL (ref 3.8–4.9)
Alkaline Phosphatase: 136 IU/L — ABNORMAL HIGH (ref 39–117)
Bilirubin Total: <0.2 mg/dL (ref 0.0–1.2)
Bilirubin, Direct: 0.07 mg/dL (ref 0.00–0.40)
Total Protein: 7.1 g/dL (ref 6.0–8.5)

## 2019-04-27 LAB — LIPID PANEL
Chol/HDL Ratio: 4.3 ratio (ref 0.0–4.4)
Cholesterol, Total: 200 mg/dL — ABNORMAL HIGH (ref 100–199)
HDL: 47 mg/dL (ref >39)
LDL Chol Calc (NIH): 101 mg/dL — ABNORMAL HIGH (ref 0–99)
Triglycerides: 306 mg/dL — ABNORMAL HIGH (ref 0–149)
VLDL Cholesterol Cal: 52 mg/dL — ABNORMAL HIGH (ref 5–40)

## 2019-04-28 ENCOUNTER — Telehealth: Payer: Self-pay | Admitting: Pharmacist

## 2019-04-28 DIAGNOSIS — E785 Hyperlipidemia, unspecified: Secondary | ICD-10-CM

## 2019-04-28 MED ORDER — PRALUENT 150 MG/ML ~~LOC~~ SOAJ: 1.0000 "pen " | SUBCUTANEOUS | 11 refills | Status: DC

## 2019-04-28 NOTE — Telephone Encounter (Signed)
Spoke to patient about her lipid results. LDL improved, but only from 115 to 101. Not the % drop we expected. Patient states she has been taking both rosuvastatin and Praluent. Will increase praluent to 150mg  q 14 hr.  Patient educated to limit alcohol to 1 drink per day or less, limits sweets and decrease carbs. Patient states her diet recently has been poor. Recheck lipid panel in 3 months. Labs scheduled.

## 2019-04-29 ENCOUNTER — Other Ambulatory Visit: Payer: Self-pay

## 2019-04-29 ENCOUNTER — Encounter: Payer: Self-pay | Admitting: Family

## 2019-04-29 ENCOUNTER — Ambulatory Visit (INDEPENDENT_AMBULATORY_CARE_PROVIDER_SITE_OTHER): Payer: Medicare Other | Admitting: Family

## 2019-04-29 VITALS — BP 142/73 | HR 60 | Temp 97.6°F | Resp 16 | Ht 65.0 in | Wt 225.6 lb

## 2019-04-29 DIAGNOSIS — R0789 Other chest pain: Secondary | ICD-10-CM | POA: Diagnosis not present

## 2019-04-29 DIAGNOSIS — R739 Hyperglycemia, unspecified: Secondary | ICD-10-CM | POA: Diagnosis not present

## 2019-04-29 DIAGNOSIS — Z0000 Encounter for general adult medical examination without abnormal findings: Secondary | ICD-10-CM | POA: Diagnosis not present

## 2019-04-29 DIAGNOSIS — M8580 Other specified disorders of bone density and structure, unspecified site: Secondary | ICD-10-CM

## 2019-04-29 LAB — HEMOGLOBIN A1C: Hgb A1c MFr Bld: 6.3 % (ref 4.6–6.5)

## 2019-04-29 NOTE — Progress Notes (Signed)
Subjective:    Patient ID: [name], female    DOB: 02-Jul-1960, 58 y.o.   MRN: [id number]  HPI   Patient presents today for complete physical.  Immunizations:10/6 flu shot, due for pneumovax booster  Diet:  Needs improvement  Exercise: walks Colonoscopy: 07/16/17 Dexa: 2017 Pap Smear: hysterectomy Mammogram:  06/2018 Wt Readings from Last 3 Encounters:  04/29/19 225 lb 9.6 oz (102.3 kg)  04/21/19 227 lb (103 kg)  04/07/19 225 lb 3.2 oz (102.2 kg)   Atypical chest pain-patient continues to have left anterior chest wall tenderness.  Last visit we performed a CT angio of her chest which was negative for PE      Review of Systems  Constitutional: Negative for unexpected weight change.  HENT: Negative for hearing loss and rhinorrhea.   Eyes: Negative for visual disturbance.  Respiratory: Negative for cough and shortness of breath.   Cardiovascular: Positive for chest pain (reports + chest wall tenderness).  Gastrointestinal: Negative for constipation and diarrhea.  Genitourinary: Negative for dysuria, frequency and hematuria.  Musculoskeletal: Negative for arthralgias and myalgias.  Skin: Negative for rash.  Neurological: Negative for headaches.  Hematological: Negative for adenopathy.  Psychiatric/Behavioral:       Denies depression/anxiety   Past Medical History:  Diagnosis Date  . Allergy    allergic rhinitis  . Arthritis   . Atypical chest pain   . Constipation   . Cyst, ovarian   . Diabetes mellitus, type II ([place])   . Endometriosis   . Esophageal stricture   . Gastroparesis   . GERD (gastroesophageal reflux disease)   . Heart murmur   . Hyperlipidemia   . Hypertension   . 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 Iron deficiency anemia   . Migraine headache   . Non-compliance   . Peptic ulcer disease   . Thyroid disease      Social History   Socioeconomic History  . Marital status: Widowed     Spouse name: Not on file  . Number of children: 2  . Years of education: Not on file  . Highest education level: Not on file  Occupational History  . Occupation: DISABILITY    Employer: UNEMPLOYED  Social Needs  . Financial resource strain: Not on file  . Food insecurity    Worry: Not on file    Inability: Not on file  . Transportation needs    Medical: Not on file    Non-medical: Not on file  Tobacco Use  . Smoking status: Former Smoker    Packs/day: 0.50    Years: 18.00    Pack years: 9.00    Types: Cigarettes    Start date: 07/29/1977    Quit date: 06/25/1994    Years since quitting: 24.8  . Smokeless tobacco: Never Used  . Tobacco comment: quit 19 years ago  Substance and Sexual Activity  . Alcohol use: Yes    Alcohol/week: 0.0 standard drinks    Comment: social drinker  . Drug use: No  . Sexual activity: Not Currently  Lifestyle  . Physical activity    Days per week: Not on file    Minutes per session: Not on file  . Stress: Not on file  Relationships  . Social Herbalist on phone: Not on file    Gets together: Not on file    Attends religious service: Not on file    Active member of  club or organization: Not on file    Attends meetings of clubs or organizations: Not on file    Relationship status: Not on file  . Intimate partner violence    Fear of current or ex partner: Not on file    Emotionally abused: Not on file    Physically abused: Not on file    Forced sexual activity: Not on file  Other Topics Concern  . Not on file  Social History Narrative   Widowed   Has 2 grown children.  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   Former Smoker Quit tobacco in 1996.  She was a pack a day smoker for approximately 10 years.   Alcohol use-yes: Social    Daily Caffeine Use:6 pack of pepsi daily     Illicit Drug Use - no    Patient does not get regular exercise.       Smoking Status:  quit    Past  Surgical History:  Procedure Laterality Date  . ABDOMINAL EXPLORATION SURGERY     w/bso   . BREAST BIOPSY    . CARDIAC CATHETERIZATION  2009   mild non obstructive CAD  . CHOLECYSTECTOMY    . COLONOSCOPY    . KNEE SURGERY  2005    left knee  . LEFT HEART CATH AND CORONARY ANGIOGRAPHY N/A 07/17/2017   Procedure: LEFT HEART CATH AND CORONARY ANGIOGRAPHY;  Surgeon: Martinique, Peter M, MD;  Location: [facility];  Service: Cardiovascular;  Laterality: N/A;  . POLYPECTOMY    . TOTAL ABDOMINAL HYSTERECTOMY    . UPPER GASTROINTESTINAL ENDOSCOPY      Family History  Problem Relation Age of Onset  . Hypertension Mother   . Rheum arthritis Mother   . Hypertension Father   . Diabetes Father   . Prostate cancer Father   . Kidney disease Father   . Diabetes Sister   . Fibromyalgia Sister   . Diabetes Maternal Grandmother   . Lung cancer Maternal Grandfather   . Arthritis Brother   . Migraines Brother   . CAD Brother   . Fibromyalgia Sister   . Migraines Sister   . Colon cancer Neg Hx   . Thyroid disease Neg Hx   . Colon polyps Neg Hx     Allergies  Allergen Reactions  . Ace Inhibitors     REACTION: chronic cough  . Celebrex [Celecoxib] Other (See Comments)    "makes me bleed"  . Diovan [Valsartan]     angioedema    Current Outpatient Medications on File Prior to Visit  Medication Sig Dispense Refill  . Albuterol Sulfate 108 (90 Base) MCG/ACT AEPB Inhale 2 puffs into the lungs 4 (four) times daily. 1 each 3  . Alirocumab (PRALUENT) 150 MG/ML SOAJ Inject 1 pen into the skin every 14 (fourteen) days. 2 pen 11  . amLODipine (NORVASC) 10 MG tablet Take 1 tablet by mouth once daily 90 tablet 1  . aspirin 81 MG chewable tablet Chew 1 tablet (81 mg total) by mouth daily.    . cyclobenzaprine (FLEXERIL) 10 MG tablet Take 1 tablet (10 mg total) by mouth 2 (two) times daily as needed for muscle spasms. 20 tablet 0  . methimazole (TAPAZOLE) 5 MG tablet Take 5 mg by mouth daily.     . metoprolol succinate (TOPROL XL) 25 MG 24 hr tablet Take 1 tablet (25 mg total) by mouth daily. 90 tablet 3  . ondansetron (ZOFRAN)  4 MG tablet Take 1 tab by mouth every 6 hours as needed for nausea. 100 tablet 1  . polyethylene glycol (MIRALAX / GLYCOLAX) packet Take 17 g by mouth 2 (two) times daily. (Patient taking differently: Take 17 g by mouth daily as needed for mild constipation. ) 14 each 0  . potassium chloride SA (K-DUR) 20 MEQ tablet Take 1 tablet (20 mEq total) by mouth daily. 30 tablet 11  . rosuvastatin (CRESTOR) 40 MG tablet Take 1 tablet (40 mg total) by mouth every other day. 45 tablet 1  . SUMAtriptan (IMITREX) 50 MG tablet Take 50 mg by mouth every 2 (two) hours as needed for migraine. May repeat in 2 hours if headache persists or recurs.    . [DISCONTINUED] atorvastatin (LIPITOR) 40 MG tablet Take 1 tablet (40 mg total) by mouth daily. 30 tablet 3   Current Facility-Administered Medications on File Prior to Visit  Medication Dose Route Frequency Provider Last Rate Last Dose  . 0.9 %  sodium chloride infusion  500 mL Intravenous Once Armbruster, Carlota Raspberry, MD        BP (!) 142/73 (BP Location: Right Arm, Patient Position: Sitting, Cuff Size: Large)   Pulse 60   Temp 97.6 F (36.4 C) (Temporal)   Resp 16   Ht 5\' 5"  (1.651 m)   Wt 225 lb 9.6 oz (102.3 kg)   SpO2 100%   BMI 37.54 kg/m       Objective:   Physical Exam Physical Exam  Constitutional: She is oriented to person, place, and time. She appears well-developed and well-nourished. No distress.  HENT:  Head: Normocephalic and atraumatic.  Right Ear: Tympanic membrane and ear canal normal.  Left Ear: Tympanic membrane appears opaque/scarred. Ear canal normal.  Mouth/Throat: Oropharynx is clear and moist.  Eyes: Pupils are equal, round, and reactive to light. No scleral icterus.  Neck: Normal range of motion. No thyromegaly present.  Cardiovascular: Normal rate and regular rhythm.   No murmur heard.  Pulmonary/Chest: Effort normal and breath sounds normal. No respiratory distress. He has no wheezes. She has no rales. She exhibits no tenderness.  Abdominal: Soft. Bowel sounds are normal. She exhibits no distension and no mass. There is no tenderness. There is no rebound and no guarding.  Musculoskeletal: She exhibits no edema. She has tenderness to palpation overlying the left upper anterior rib cage above the left breast.  She also has some tenderness in the right lower back. Lymphadenopathy:    She has no cervical adenopathy.  Neurological: She is alert and oriented to person, place, and time. She has normal patellar reflexes. She exhibits normal muscle tone. Coordination normal.  Skin: Skin is warm and dry.  Psychiatric: She has a normal mood and affect. Her behavior is normal. Judgment and thought content normal.  Breasts: Examined lying Right: Without masses, retractions, discharge or axillary adenopathy.  Left: Without masses, retractions, discharge or axillary adenopathy.       Assessment & Plan:   Preventive care-discussed healthy diet, exercise, and weight loss.  Immunizations reviewed and up-to-date.  She will be due for a mammogram in January.  She is also due for bone density.  Atypical chest pain-reproducible on exam today.  Likely musculoskeletal.  She has a history of GI bleed on Celebrex therefore I do not wish to treat her with an NSAID.  We discussed Tylenol as needed and patient to call if symptoms worsen or they do not improve.  Hyperglycemia- will obtain follow up A1C.  Assessment & Plan:

## 2019-04-29 NOTE — Patient Instructions (Signed)
Please complete lab work prior to leaving.   

## 2019-05-11 ENCOUNTER — Other Ambulatory Visit: Payer: Self-pay | Admitting: Family

## 2019-05-25 ENCOUNTER — Inpatient Hospital Stay: Payer: BC Managed Care – PPO | Admitting: Family

## 2019-05-25 ENCOUNTER — Inpatient Hospital Stay: Payer: BC Managed Care – PPO

## 2019-06-03 ENCOUNTER — Other Ambulatory Visit: Payer: Self-pay | Admitting: Internal Medicine

## 2019-06-03 ENCOUNTER — Other Ambulatory Visit: Payer: Self-pay | Admitting: Endocrinology

## 2019-07-09 ENCOUNTER — Ambulatory Visit (HOSPITAL_BASED_OUTPATIENT_CLINIC_OR_DEPARTMENT_OTHER)
Admission: RE | Admit: 2019-07-09 | Discharge: 2019-07-09 | Disposition: A | Discharge status: Home / Self Care | Payer: Medicare Other | Source: Ambulatory Visit | Attending: Family | Admitting: Family

## 2019-07-09 ENCOUNTER — Encounter: Payer: Self-pay | Admitting: Family

## 2019-07-09 ENCOUNTER — Other Ambulatory Visit: Payer: Self-pay

## 2019-07-09 DIAGNOSIS — M8589 Other specified disorders of bone density and structure, multiple sites: Secondary | ICD-10-CM | POA: Insufficient documentation

## 2019-07-09 DIAGNOSIS — Z13820 Encounter for screening for osteoporosis: Secondary | ICD-10-CM | POA: Insufficient documentation

## 2019-07-09 DIAGNOSIS — Z0000 Encounter for general adult medical examination without abnormal findings: Secondary | ICD-10-CM

## 2019-07-09 DIAGNOSIS — Z1231 Encounter for screening mammogram for malignant neoplasm of breast: Secondary | ICD-10-CM | POA: Diagnosis not present

## 2019-07-09 DIAGNOSIS — M8580 Other specified disorders of bone density and structure, unspecified site: Secondary | ICD-10-CM

## 2019-07-10 ENCOUNTER — Encounter: Payer: Self-pay | Admitting: Medical

## 2019-07-10 ENCOUNTER — Telehealth: Payer: Self-pay | Admitting: *Deleted

## 2019-07-10 ENCOUNTER — Ambulatory Visit (INDEPENDENT_AMBULATORY_CARE_PROVIDER_SITE_OTHER): Payer: Medicare Other | Admitting: Medical

## 2019-07-10 VITALS — Temp 98.3°F | Ht 65.0 in | Wt 223.0 lb

## 2019-07-10 DIAGNOSIS — R05 Cough: Secondary | ICD-10-CM | POA: Diagnosis not present

## 2019-07-10 DIAGNOSIS — H938X3 Other specified disorders of ear, bilateral: Secondary | ICD-10-CM | POA: Diagnosis not present

## 2019-07-10 DIAGNOSIS — R059 Cough, unspecified: Secondary | ICD-10-CM

## 2019-07-10 MED ORDER — BENZONATATE 100 MG PO CAPS: 100.0000 mg | ORAL_CAPSULE | Freq: Three times a day (TID) | ORAL | 0 refills | Status: DC | PRN

## 2019-07-10 MED ORDER — FLUTICASONE PROPIONATE 50 MCG/ACT NA SUSP: 2.0000 | Freq: Every day | NASAL | 1 refills | Status: DC

## 2019-07-10 NOTE — Telephone Encounter (Signed)
Spoke with pt. She does not have access to smartphone, computer or tablet. PCP schedule is full. Scheduled telephone visit with Mackie Pai, PA-C at 3pm today.   Copied from [place] [phone]. Topic: Appointment Scheduling - Scheduling Inquiry for Clinic >> Jul 10, 2019 12:25 PM Percell Belt A wrote: Reason for CRM: pt called in and is having flu like symptoms and would like to make a Doxy appt today.  Please advise   Best number  [phone]

## 2019-07-10 NOTE — Progress Notes (Signed)
Subjective:    Patient ID: [name], female    DOB: 1960-09-04, 59 y.o.   MRN: [id number]  HPI  Virtual Visit via Telephone Note  I connected with [name] on 07/10/19 at  3:00 PM EST by telephone and verified that I am speaking with the correct person using two identifiers.  Location: Patient: home Provider: home   I discussed the limitations, risks, security and privacy concerns of performing an evaluation and management service by telephone and the availability of in person appointments. I also discussed with the patient that there may be a patient responsible charge related to this service. The patient expressed understanding and agreed to proceed.   Pt does not have fit bit or smart watch. No bp cuff.  History of Present Illness:  Pt states yesterday she got stopped up ear with some nasal congestion. States she developed a dry cough. No sinus pressure.  No fever, no chills, no sweats or body aches. Pt hs normal sense of smell. No nausea, no vomiting or diarrhea.  No sneezing.  Mild cough. Pt able to sleep.  No family or friends.   Pt only goes to grocery store/to get essential.   Observations/Objective: General- no acute distress, pleasant, alert and oriented. Normal speech.  Assessment and Plan: Rest, hydrate and tylenol if fever.  Fllonase for nasal congestion and bezonatate for cough.  If signs an symptoms worsen prod cough, chest congestion then  let me kow rx zpack.  Call and get scheued for covid test early next week Monday or Tuesday.(symptoms started yesterday so better to test early next week)  Follow up date to be determined when covid test results come in or as needed.  Stay at home quarantine pending covid test results.  Follow Up Instructions:    I discussed the assessment and treatment plan with the patient. The patient was provided an opportunity to ask questions and all were answered. The patient agreed with the plan and  demonstrated an understanding of the instructions.   The patient was advised to call back or seek an in-person evaluation if the symptoms worsen or if the condition fails to improve as anticipated.  I provided 20  minutes of non-face-to-face time during this encounter.   Mackie Pai, PA-C    Review of Systems  Constitutional: Negative for chills, fatigue and fever.  HENT: Positive for congestion and ear pain. Negative for postnasal drip, sinus pressure and sinus pain.   Respiratory: Positive for cough. Negative for chest tightness, shortness of breath and wheezing.   Cardiovascular: Negative for chest pain and palpitations.  Gastrointestinal: Negative for abdominal pain.  Musculoskeletal: Negative for back pain and myalgias.  Skin: Negative for rash.  Neurological: Negative for dizziness, seizures, syncope, weakness and headaches.  Hematological: Negative for adenopathy. Does not bruise/bleed easily.  Psychiatric/Behavioral: Negative for behavioral problems and confusion.   Past Medical History:  Diagnosis Date  . Allergy    allergic rhinitis  . Arthritis   . Atypical chest pain   . Constipation   . Cyst, ovarian   . Diabetes mellitus, type II ([place])   . Endometriosis   . Esophageal stricture   . Gastroparesis   . GERD (gastroesophageal reflux disease)   . Heart murmur   . Hyperlipidemia   . Hypertension   . 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 Iron deficiency anemia   . Migraine headache   . Non-compliance   .  Peptic ulcer disease   . Thyroid disease      Social History   Socioeconomic History  . Marital status: Widowed    Spouse name: Not on file  . Number of children: 2  . Years of education: Not on file  . Highest education level: Not on file  Occupational History  . Occupation: DISABILITY    Employer: UNEMPLOYED  Tobacco Use  . Smoking status: Former Smoker    Packs/day: 0.50    Years: 18.00      Pack years: 9.00    Types: Cigarettes    Start date: 07/29/1977    Quit date: 06/25/1994    Years since quitting: 25.0  . Smokeless tobacco: Never Used  . Tobacco comment: quit 19 years ago  Substance and Sexual Activity  . Alcohol use: Yes    Alcohol/week: 0.0 standard drinks    Comment: social drinker  . Drug use: No  . Sexual activity: Not Currently  Other Topics Concern  . Not on file  Social History Narrative   Widowed   Has 2 grown children.  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   Former Smoker Quit tobacco in 1996.  She was a pack a day smoker for approximately 10 years.   Alcohol use-yes: Social    Daily Caffeine Use:6 pack of pepsi daily     Illicit Drug Use - no    Patient does not get regular exercise.       Smoking Status:  quit   Social Determinants of Health   Financial Resource Strain:   . Difficulty of Paying Living Expenses: Not on file  Food Insecurity:   . Worried About Charity fundraiser in the Last Year: Not on file  . Ran Out of Food in the Last Year: Not on file  Transportation Needs:   . Lack of Transportation (Medical): Not on file  . Lack of Transportation (Non-Medical): Not on file  Physical Activity:   . Days of Exercise per Week: Not on file  . Minutes of Exercise per Session: Not on file  Stress:   . Feeling of Stress : Not on file  Social Connections:   . Frequency of Communication with Friends and Family: Not on file  . Frequency of Social Gatherings with Friends and Family: Not on file  . Attends Religious Services: Not on file  . Active Member of Clubs or Organizations: Not on file  . Attends Archivist Meetings: Not on file  . Marital Status: Not on file  Intimate Partner Violence:   . Fear of Current or Ex-Partner: Not on file  . Emotionally Abused: Not on file  . Physically Abused: Not on file  . Sexually Abused: Not on file    Past Surgical History:  Procedure  Laterality Date  . ABDOMINAL EXPLORATION SURGERY     w/bso   . BREAST BIOPSY    . CARDIAC CATHETERIZATION  2009   mild non obstructive CAD  . CHOLECYSTECTOMY    . COLONOSCOPY    . KNEE SURGERY  2005    left knee  . LEFT HEART CATH AND CORONARY ANGIOGRAPHY N/A 07/17/2017   Procedure: LEFT HEART CATH AND CORONARY ANGIOGRAPHY;  Surgeon: Martinique, Peter M, MD;  Location: [facility];  Service: Cardiovascular;  Laterality: N/A;  . POLYPECTOMY    . TOTAL ABDOMINAL HYSTERECTOMY    . UPPER GASTROINTESTINAL ENDOSCOPY      Family History  Problem Relation Age of Onset  . Hypertension Mother   . Rheum arthritis Mother   . Hypertension Father   . Diabetes Father   . Prostate cancer Father   . Kidney disease Father   . Diabetes Sister   . Fibromyalgia Sister   . Diabetes Maternal Grandmother   . Lung cancer Maternal Grandfather   . Arthritis Brother   . Migraines Brother   . CAD Brother   . Fibromyalgia Sister   . Migraines Sister   . Colon cancer Neg Hx   . Thyroid disease Neg Hx   . Colon polyps Neg Hx     Allergies  Allergen Reactions  . Ace Inhibitors     REACTION: chronic cough  . Celebrex [Celecoxib] Other (See Comments)    "makes me bleed"  . Diovan [Valsartan]     angioedema    Current Outpatient Medications on File Prior to Visit  Medication Sig Dispense Refill  . Albuterol Sulfate 108 (90 Base) MCG/ACT AEPB Inhale 2 puffs into the lungs 4 (four) times daily. 1 each 3  . Alirocumab (PRALUENT) 150 MG/ML SOAJ Inject 1 pen into the skin every 14 (fourteen) days. 2 pen 11  . amLODipine (NORVASC) 10 MG tablet Take 1 tablet by mouth once daily 90 tablet 1  . aspirin 81 MG chewable tablet Chew 1 tablet (81 mg total) by mouth daily.    . cyclobenzaprine (FLEXERIL) 10 MG tablet Take 1 tablet (10 mg total) by mouth 2 (two) times daily as needed for muscle spasms. 20 tablet 0  . methimazole (TAPAZOLE) 5 MG tablet Take 1 tablet by mouth once daily 90 tablet 0  .  metoprolol succinate (TOPROL XL) 25 MG 24 hr tablet Take 1 tablet (25 mg total) by mouth daily. 90 tablet 3  . ondansetron (ZOFRAN) 4 MG tablet Take 1 tab by mouth every 6 hours as needed for nausea. 100 tablet 1  . polyethylene glycol (MIRALAX / GLYCOLAX) packet Take 17 g by mouth 2 (two) times daily. (Patient taking differently: Take 17 g by mouth daily as needed for mild constipation. ) 14 each 0  . potassium chloride SA (K-DUR) 20 MEQ tablet Take 1 tablet (20 mEq total) by mouth daily. 30 tablet 11  . rosuvastatin (CRESTOR) 40 MG tablet Take 1 tablet (40 mg total) by mouth every other day. 45 tablet 1  . SUMAtriptan (IMITREX) 50 MG tablet Take 50 mg by mouth every 2 (two) hours as needed for migraine. May repeat in 2 hours if headache persists or recurs.    . [DISCONTINUED] atorvastatin (LIPITOR) 40 MG tablet Take 1 tablet (40 mg total) by mouth daily. 30 tablet 3   Current Facility-Administered Medications on File Prior to Visit  Medication Dose Route Frequency Provider Last Rate Last Admin  . 0.9 %  sodium chloride infusion  500 mL Intravenous Once Armbruster, Carlota Raspberry, MD        Temp 98.3 F (36.8 C) (Oral)   Ht 5\' 5"  (1.651 m)   Wt 223 lb (101.2 kg)   BMI 37.11 kg/m      Objective:   Physical Exam        Assessment & Plan:

## 2019-07-10 NOTE — Patient Instructions (Addendum)
Rest, hydrate and tylenol if fever.  Fllonase for nasal congestion and bezonatate for cough.  If signs an symptoms worsen prod cough, chest congestion then  let me kow rx zpack.  Call and get scheued for covid test early next week Monday or Tuesday.(symptoms started yesterday so better to test early next week)  Follow up date to be determined when covid test results come in or as needed.  Stay at home quarantine pending covid test results.

## 2019-07-13 NOTE — Progress Notes (Signed)
Mailed out to patient 

## 2019-07-16 ENCOUNTER — Ambulatory Visit
Admission: EM | Admit: 2019-07-16 | Discharge: 2019-07-16 | Disposition: A | Discharge status: Home / Self Care | Payer: BC Managed Care – PPO

## 2019-07-16 ENCOUNTER — Encounter: Payer: Self-pay | Admitting: Emergency Medicine

## 2019-07-16 ENCOUNTER — Other Ambulatory Visit: Payer: Self-pay

## 2019-07-16 DIAGNOSIS — R1011 Right upper quadrant pain: Secondary | ICD-10-CM

## 2019-07-16 DIAGNOSIS — I10 Essential (primary) hypertension: Secondary | ICD-10-CM | POA: Diagnosis not present

## 2019-07-16 NOTE — ED Notes (Signed)
Patient able to ambulate independently  

## 2019-07-16 NOTE — ED Provider Notes (Signed)
[facility]    CSN: [id number] Arrival date & time: 07/16/19  1814      History   Chief Complaint Chief Complaint  Patient presents with  . Abdominal Pain    HPI [name] is a 59 y.o. female with numerous comorbidities including history of obesity, diabetes, esophageal stricture, iron deficiency anemia, peptic ulcer disease, thyroid disease, endometriosis, GERD, and liver hemangioma presenting for right upper quadrant pain since yesterday.  States it is sharp, sometimes radiates up to her right shoulder.  Patient is status post cholecystectomy.  Does endorse early satiety, though states this is chronic/stable for her given diagnosis of gastroparesis.  Patient denies decreased appetite, fever, nausea, vomiting, diarrhea.  No hematochezia, melena.  Patient states pain is sometimes worse with deep inspiration.  Patient denies chest pain, palpitations, cough, known sick contacts.  Has tried ibuprofen with some relief.  No change in urinary or bowel habits, pelvic pain, vaginal discharge.   Past Medical History:  Diagnosis Date  . Allergy    allergic rhinitis  . Arthritis   . Atypical chest pain   . Constipation   . Cyst, ovarian   . Diabetes mellitus, type II ([place])   . Endometriosis   . Esophageal stricture   . Gastroparesis   . GERD (gastroesophageal reflux disease)   . Heart murmur   . Hyperlipidemia   . Hypertension   . 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 Iron deficiency anemia   . Migraine headache   . Non-compliance   . Peptic ulcer disease   . Thyroid disease     Patient Active Problem List   Diagnosis Date Noted  . Colitis 02/04/2018  . Aortic atherosclerosis ([place]) 07/17/2017  . Chest pain 07/16/2017  . Insomnia 01/12/2017  . Hyperglycemia 10/07/2014  . HTN (hypertension) 09/18/2012  . Breast cyst 06/09/2012  . Hyperthyroidism 02/29/2012  . Unspecified constipation 04/18/2011  .  Can't get food down 04/18/2011  . Ulnar neuropathy 03/30/2011  . Low back pain 10/18/2010  . Fibromyalgia 07/20/2009  . Hyperlipidemia 05/26/2009  . Vitamin D deficiency 05/23/2009  . Iron deficiency anemia due to chronic blood loss 10/25/2008  . GERD 10/25/2008  . LIVER HEMANGIOMA 01/01/2008  . ESOPHAGEAL STRICTURE 01/01/2008  . Gastroparesis 01/01/2008  . ALLERGIC RHINITIS 12/08/2007  . Borderline type 2 diabetes mellitus 10/31/2007  . Migraine headache 08/11/2007  . HIATAL HERNIA 08/11/2007  . OVARIAN CYST 08/11/2007  . Essential hypertension, benign 05/06/2007    Past Surgical History:  Procedure Laterality Date  . ABDOMINAL EXPLORATION SURGERY     w/bso   . BREAST BIOPSY    . CARDIAC CATHETERIZATION  2009   mild non obstructive CAD  . CHOLECYSTECTOMY    . COLONOSCOPY    . KNEE SURGERY  2005    left knee  . LEFT HEART CATH AND CORONARY ANGIOGRAPHY N/A 07/17/2017   Procedure: LEFT HEART CATH AND CORONARY ANGIOGRAPHY;  Surgeon: Martinique, Peter M, MD;  Location: [facility];  Service: Cardiovascular;  Laterality: N/A;  . POLYPECTOMY    . TOTAL ABDOMINAL HYSTERECTOMY    . UPPER GASTROINTESTINAL ENDOSCOPY      OB History   No obstetric history on file.      Home Medications    Prior to Admission medications   Medication Sig Start Date End Date Taking? Authorizing Provider  Albuterol Sulfate 108 (90 Base) MCG/ACT AEPB Inhale 2 puffs into the lungs 4 (four)  times daily. 09/01/18  Yes [name], NP  fluticasone (FLONASE) 50 MCG/ACT nasal spray Place 2 sprays into both nostrils daily. 07/10/19  Yes Saguier, Percell Miller, PA-C  Alirocumab (PRALUENT) 150 MG/ML SOAJ Inject 1 pen into the skin every 14 (fourteen) days. 04/28/19   Fay Records, MD  amLODipine (NORVASC) 10 MG tablet Take 1 tablet by mouth once daily 05/12/19   [name], NP  aspirin 81 MG chewable tablet Chew 1 tablet (81 mg total) by mouth daily. 07/19/17   Samuella Cota, MD  benzonatate  (TESSALON) 100 MG capsule Take 1 capsule (100 mg total) by mouth 3 (three) times daily as needed for cough. 07/10/19   Saguier, Percell Miller, PA-C  cyclobenzaprine (FLEXERIL) 10 MG tablet Take 1 tablet (10 mg total) by mouth 2 (two) times daily as needed for muscle spasms. 04/06/19   Tedd Sias, PA  methimazole (TAPAZOLE) 5 MG tablet Take 1 tablet by mouth once daily 06/04/19   Renato Shin, MD  metoprolol succinate (TOPROL XL) 25 MG 24 hr tablet Take 1 tablet (25 mg total) by mouth daily. 02/25/19   Fay Records, MD  ondansetron (ZOFRAN) 4 MG tablet Take 1 tab by mouth every 6 hours as needed for nausea. 04/21/19   [name], NP  polyethylene glycol (MIRALAX / GLYCOLAX) packet Take 17 g by mouth 2 (two) times daily. Patient taking differently: Take 17 g by mouth daily as needed for mild constipation.  02/08/18   Lavina Hamman, MD  potassium chloride SA (K-DUR) 20 MEQ tablet Take 1 tablet (20 mEq total) by mouth daily. 02/12/19   Fay Records, MD  rosuvastatin (CRESTOR) 40 MG tablet Take 1 tablet (40 mg total) by mouth every other day. 04/21/19   [name], NP  SUMAtriptan (IMITREX) 50 MG tablet Take 50 mg by mouth every 2 (two) hours as needed for migraine. May repeat in 2 hours if headache persists or recurs.    [provider]  atorvastatin (LIPITOR) 40 MG tablet Take 1 tablet (40 mg total) by mouth daily. 08/15/15 05/30/18  [name], NP    Family History Family History  Problem Relation Age of Onset  . Hypertension Mother   . Rheum arthritis Mother   . Hypertension Father   . Diabetes Father   . Prostate cancer Father   . Kidney disease Father   . Diabetes Sister   . Fibromyalgia Sister   . Diabetes Maternal Grandmother   . Lung cancer Maternal Grandfather   . Arthritis Brother   . Migraines Brother   . CAD Brother   . Fibromyalgia Sister   . Migraines Sister   . Colon cancer Neg Hx   . Thyroid disease Neg Hx   . Colon polyps Neg Hx      Social History Social History   Tobacco Use  . Smoking status: Former Smoker    Packs/day: 0.50    Years: 18.00    Pack years: 9.00    Types: Cigarettes    Start date: 07/29/1977    Quit date: 06/25/1994    Years since quitting: 25.0  . Smokeless tobacco: Never Used  . Tobacco comment: quit 19 years ago  Substance Use Topics  . Alcohol use: Yes    Alcohol/week: 0.0 standard drinks    Comment: social drinker  . Drug use: No     Allergies   Ace inhibitors, Celebrex [celecoxib], and Diovan [valsartan]   Review of Systems As per HPI   Physical  Exam Triage Vital Signs ED Triage Vitals  Enc Vitals Group     BP      Pulse      Resp      Temp      Temp src      SpO2      Weight      Height      Head Circumference      Peak Flow      Pain Score      Pain Loc      Pain Edu?      Excl. in [place]?    No data found.  Updated Vital Signs BP (!) 143/78 (BP Location: Left Arm)   Pulse 82   Temp 98.1 F (36.7 C) (Temporal)   Resp 18   SpO2 94%   Visual Acuity Right Eye Distance:   Left Eye Distance:   Bilateral Distance:    Right Eye Near:   Left Eye Near:    Bilateral Near:     Physical Exam Constitutional:      General: She is not in acute distress.    Appearance: She is obese. She is not ill-appearing.  HENT:     Head: Normocephalic and atraumatic.     Mouth/Throat:     Mouth: Mucous membranes are moist.     Pharynx: Oropharynx is clear.  Eyes:     General: No scleral icterus.    Conjunctiva/sclera: Conjunctivae normal.     Pupils: Pupils are equal, round, and reactive to light.  Neck:     Comments: Trachea midline, negative JVD Cardiovascular:     Rate and Rhythm: Normal rate and regular rhythm.     Heart sounds: No murmur. No gallop.   Pulmonary:     Effort: Pulmonary effort is normal. No respiratory distress.     Breath sounds: No wheezing.  Abdominal:     General: A surgical scar is present. Bowel sounds are normal.     Palpations:  Abdomen is soft. There is no hepatomegaly, splenomegaly, mass or pulsatile mass.     Tenderness: There is no abdominal tenderness. There is no right CVA tenderness, left CVA tenderness, guarding or rebound. Negative signs include Murphy's sign, Rovsing's sign and McBurney's sign.       Comments: Obese  Musculoskeletal:     Right lower leg: No edema.     Left lower leg: No edema.  Skin:    General: Skin is warm.     Capillary Refill: Capillary refill takes less than 2 seconds.     Coloration: Skin is not cyanotic, jaundiced, mottled or pale.     Findings: No rash.  Neurological:     General: No focal deficit present.     Mental Status: She is alert and oriented to person, place, and time.      UC Treatments / Results  Labs (all labs ordered are listed, but only abnormal results are displayed) Labs Reviewed - No data to display  EKG   Radiology No results found.  Procedures Procedures (including critical care time)  Medications Ordered in UC Medications - No data to display  Initial Impression / Assessment and Plan / UC Course  I have reviewed the triage vital signs and the nursing notes.  Pertinent labs & imaging results that were available during my care of the patient were reviewed by me and considered in my medical decision making (see chart for details).     Patient afebrile, nontoxic, without jaundice or scleral  icterus.  Patient does have significant comorbidities that could be contributing to today's abdominal pain.  Low concern for acute/malignant process such as cholangitis, hepatitis, ischemic colitis.  More likely acute on chronic conditions such as gastroparesis, gas, constipation.  Will practice bland diet, keep symptom log, and follow-up with PCP for further evaluation/management.  Return precautions discussed, patient verbalized understanding and is agreeable to plan. Final Clinical Impressions(s) / UC Diagnoses   Final diagnoses:  RUQ pain      Discharge Instructions     Call primary care office tomorrow to schedule follow up. Try soft/bland diet, avoid prepared foods. Keep symptom log in the interim. Go to ER for worsening pain, vomiting, fever, yellow eyes, black/red stool.    ED Prescriptions    None     PDMP not reviewed this encounter.   Neldon [place], [place] 07/16/19 1919

## 2019-07-16 NOTE — Discharge Instructions (Signed)
Call primary care office tomorrow to schedule follow up. Try soft/bland diet, avoid prepared foods. Keep symptom log in the interim. Go to ER for worsening pain, vomiting, fever, yellow eyes, black/red stool.

## 2019-07-16 NOTE — ED Triage Notes (Signed)
Pt presents to [facility] for assessment of right sided flank/abdominal pain starting yesterday.  States it has also radiated into her right shoulder.  C/o body aches over the weekend.  Denies URI symptoms.  Denies n/v/d, last BM yesterday, was normal according to patient.

## 2019-07-21 ENCOUNTER — Other Ambulatory Visit: Payer: Self-pay

## 2019-07-21 ENCOUNTER — Ambulatory Visit: Payer: Medicare Other | Admitting: Family

## 2019-07-21 ENCOUNTER — Other Ambulatory Visit: Payer: Medicare Other

## 2019-07-21 DIAGNOSIS — E785 Hyperlipidemia, unspecified: Secondary | ICD-10-CM

## 2019-07-21 LAB — LDL CHOLESTEROL, DIRECT: LDL Direct: 76 mg/dL (ref 0–99)

## 2019-07-21 LAB — LIPID PANEL
Chol/HDL Ratio: 3.2 ratio (ref 0.0–4.4)
Cholesterol, Total: 140 mg/dL (ref 100–199)
HDL: 44 mg/dL (ref >39)
LDL Chol Calc (NIH): 74 mg/dL (ref 0–99)
Triglycerides: 122 mg/dL (ref 0–149)
VLDL Cholesterol Cal: 22 mg/dL (ref 5–40)

## 2019-07-21 LAB — HEPATIC FUNCTION PANEL
ALT: 82 IU/L — ABNORMAL HIGH (ref 0–32)
AST: 36 IU/L (ref 0–40)
Albumin: 4.1 g/dL (ref 3.8–4.9)
Alkaline Phosphatase: 215 IU/L — ABNORMAL HIGH (ref 39–117)
Bilirubin Total: 0.3 mg/dL (ref 0.0–1.2)
Bilirubin, Direct: 0.13 mg/dL (ref 0.00–0.40)
Total Protein: 6.8 g/dL (ref 6.0–8.5)

## 2019-07-24 ENCOUNTER — Telehealth: Payer: Self-pay | Admitting: Internal Medicine

## 2019-07-24 DIAGNOSIS — R945 Abnormal results of liver function studies: Secondary | ICD-10-CM

## 2019-07-24 DIAGNOSIS — R7989 Other specified abnormal findings of blood chemistry: Secondary | ICD-10-CM

## 2019-07-24 NOTE — Telephone Encounter (Signed)
Patient returning call in regards to lab results.  

## 2019-07-24 NOTE — Telephone Encounter (Signed)
-----   Message from [name] V, MD sent at 07/21/2019  5:35 PM EST ----- Lipids are [facility] better than previous     LDL 75    I would keep on same meds    ALT is mildly increased but AST normal Plrease order repeat LFTs with GGT in 4 wks

## 2019-07-27 ENCOUNTER — Other Ambulatory Visit: Payer: Self-pay

## 2019-07-28 ENCOUNTER — Inpatient Hospital Stay (HOSPITAL_BASED_OUTPATIENT_CLINIC_OR_DEPARTMENT_OTHER): Payer: Medicare Other | Admitting: Family

## 2019-07-28 ENCOUNTER — Encounter: Payer: Self-pay | Admitting: Family

## 2019-07-28 ENCOUNTER — Other Ambulatory Visit: Payer: Self-pay | Admitting: Family

## 2019-07-28 ENCOUNTER — Inpatient Hospital Stay: Payer: Medicare Other | Attending: Family

## 2019-07-28 ENCOUNTER — Ambulatory Visit (INDEPENDENT_AMBULATORY_CARE_PROVIDER_SITE_OTHER): Payer: Medicare Other | Admitting: Family

## 2019-07-28 ENCOUNTER — Telehealth: Payer: Self-pay | Admitting: Family

## 2019-07-28 VITALS — BP 142/66 | HR 63 | Temp 97.1°F | Resp 19 | Ht 65.5 in | Wt 223.0 lb

## 2019-07-28 VITALS — BP 129/64 | HR 59 | Temp 98.1°F | Resp 16 | Ht 65.5 in | Wt 223.0 lb

## 2019-07-28 DIAGNOSIS — M542 Cervicalgia: Secondary | ICD-10-CM | POA: Insufficient documentation

## 2019-07-28 DIAGNOSIS — E785 Hyperlipidemia, unspecified: Secondary | ICD-10-CM

## 2019-07-28 DIAGNOSIS — R1310 Dysphagia, unspecified: Secondary | ICD-10-CM

## 2019-07-28 DIAGNOSIS — M545 Low back pain, unspecified: Secondary | ICD-10-CM

## 2019-07-28 DIAGNOSIS — R748 Abnormal levels of other serum enzymes: Secondary | ICD-10-CM

## 2019-07-28 DIAGNOSIS — D509 Iron deficiency anemia, unspecified: Secondary | ICD-10-CM | POA: Insufficient documentation

## 2019-07-28 DIAGNOSIS — D631 Anemia in chronic kidney disease: Secondary | ICD-10-CM | POA: Diagnosis not present

## 2019-07-28 DIAGNOSIS — R7303 Prediabetes: Secondary | ICD-10-CM

## 2019-07-28 DIAGNOSIS — D500 Iron deficiency anemia secondary to blood loss (chronic): Secondary | ICD-10-CM

## 2019-07-28 DIAGNOSIS — Z79899 Other long term (current) drug therapy: Secondary | ICD-10-CM | POA: Diagnosis not present

## 2019-07-28 DIAGNOSIS — R5383 Other fatigue: Secondary | ICD-10-CM | POA: Diagnosis not present

## 2019-07-28 DIAGNOSIS — E559 Vitamin D deficiency, unspecified: Secondary | ICD-10-CM | POA: Diagnosis not present

## 2019-07-28 DIAGNOSIS — G8929 Other chronic pain: Secondary | ICD-10-CM

## 2019-07-28 DIAGNOSIS — G43809 Other migraine, not intractable, without status migrainosus: Secondary | ICD-10-CM

## 2019-07-28 DIAGNOSIS — I10 Essential (primary) hypertension: Secondary | ICD-10-CM

## 2019-07-28 LAB — COMPREHENSIVE METABOLIC PANEL
ALT: 32 U/L (ref 0–35)
AST: 17 U/L (ref 0–37)
Albumin: 3.8 g/dL (ref 3.5–5.2)
Alkaline Phosphatase: 230 U/L — ABNORMAL HIGH (ref 39–117)
BUN: 13 mg/dL (ref 6–23)
CO2: 28 mEq/L (ref 19–32)
Calcium: 9.1 mg/dL (ref 8.4–10.5)
Chloride: 102 mEq/L (ref 96–112)
Creatinine, Ser: 0.75 mg/dL (ref 0.40–1.20)
GFR: 95.1 mL/min (ref >60.00)
Glucose, Bld: 107 mg/dL — ABNORMAL HIGH (ref 70–99)
Potassium: 4.1 mEq/L (ref 3.5–5.1)
Sodium: 138 mEq/L (ref 135–145)
Total Bilirubin: 0.5 mg/dL (ref 0.2–1.2)
Total Protein: 7.1 g/dL (ref 6.0–8.3)

## 2019-07-28 LAB — CBC WITH DIFFERENTIAL (CANCER CENTER ONLY)
Abs Immature Granulocytes: 0.05 10*3/uL (ref 0.00–0.07)
Basophils Absolute: 0 10*3/uL (ref 0.0–0.1)
Basophils Relative: 0 %
Eosinophils Absolute: 0 10*3/uL (ref 0.0–0.5)
Eosinophils Relative: 1 %
HCT: 34.5 % — ABNORMAL LOW (ref 36.0–46.0)
Hemoglobin: 10.1 g/dL — ABNORMAL LOW (ref 12.0–15.0)
Immature Granulocytes: 1 %
Lymphocytes Relative: 25 %
Lymphs Abs: 1.9 10*3/uL (ref 0.7–4.0)
MCH: 21.9 pg — ABNORMAL LOW (ref 26.0–34.0)
MCHC: 29.3 g/dL — ABNORMAL LOW (ref 30.0–36.0)
MCV: 74.8 fL — ABNORMAL LOW (ref 80.0–100.0)
Monocytes Absolute: 0.7 10*3/uL (ref 0.1–1.0)
Monocytes Relative: 9 %
Neutro Abs: 4.9 10*3/uL (ref 1.7–7.7)
Neutrophils Relative %: 64 %
Platelet Count: 263 10*3/uL (ref 150–400)
RBC: 4.61 MIL/uL (ref 3.87–5.11)
RDW: 13.6 % (ref 11.5–15.5)
WBC Count: 7.6 10*3/uL (ref 4.0–10.5)
nRBC: 0 % (ref 0.0–0.2)

## 2019-07-28 LAB — IRON AND TIBC
Iron: 25 ug/dL — ABNORMAL LOW (ref 41–142)
Saturation Ratios: 11 % — ABNORMAL LOW (ref 21–57)
TIBC: 220 ug/dL — ABNORMAL LOW (ref 236–444)
UIBC: 195 ug/dL (ref 120–384)

## 2019-07-28 LAB — RETICULOCYTES
Immature Retic Fract: 15.8 % (ref 2.3–15.9)
RBC.: 4.61 MIL/uL (ref 3.87–5.11)
Retic Count, Absolute: 40.1 10*3/uL (ref 19.0–186.0)
Retic Ct Pct: 0.9 % (ref 0.4–3.1)

## 2019-07-28 LAB — FERRITIN: Ferritin: 1420 ng/mL — ABNORMAL HIGH (ref 11–307)

## 2019-07-28 LAB — HEMOGLOBIN A1C: Hgb A1c MFr Bld: 6.9 % — ABNORMAL HIGH (ref 4.6–6.5)

## 2019-07-28 MED ORDER — ONDANSETRON HCL 4 MG PO TABS: ORAL_TABLET | ORAL | 1 refills | Status: DC

## 2019-07-28 MED ORDER — CYCLOBENZAPRINE HCL 10 MG PO TABS: 10.0000 mg | ORAL_TABLET | Freq: Two times a day (BID) | ORAL | 0 refills | Status: DC | PRN

## 2019-07-28 NOTE — Telephone Encounter (Signed)
Called and spoke with patient regarding appointments scheduled per 2/2 los

## 2019-07-28 NOTE — Progress Notes (Signed)
Subjective:    Patient ID: [name], female    DOB: Apr 21, 1961, 59 y.o.   MRN: [id number]  HPI  Patient is a 59 yr old female who presents today for follow up.  HTN-  Maintained on amlodipine and metoprolol.   Denies LE edema, CP.   BP Readings from Last 3 Encounters:  07/28/19 129/64  07/16/19 (!) 143/78  04/29/19 (!) 142/73   Hyperlipidemia- maintained on crestor/praluent. Lab Results  Component Value Date   CHOL 140 07/21/2019   HDL 44 07/21/2019   LDLCALC 74 07/21/2019   LDLDIRECT 76 07/21/2019   TRIG 122 07/21/2019   CHOLHDL 3.2 07/21/2019   Migraines- no recent migraines. Uses imitrex rarely.   Dyphagia- with liquids and solids.  She is requesting referral back to GI.  DM2-sugar is diet controlled. Lab Results  Component Value Date   HGBA1C 6.3 04/29/2019   HGBA1C 6.1 (H) 02/06/2018   HGBA1C 5.9 (H) 10/19/2016   Lab Results  Component Value Date   MICROALBUR <0.7 02/11/2015   LDLCALC 74 07/21/2019   CREATININE 0.78 04/06/2019   Chronic back pain- has seen spine specialist.  She reports that she continues to have low back pain which is worse on the right.  Previously followed at [facility] by Dr. Maryjean Ka for pain.  Requesting referral back to Dr. Maryjean Ka.    Review of Systems   See HPI  Past Medical History:  Diagnosis Date  . Allergy    allergic rhinitis  . Arthritis   . Atypical chest pain   . Constipation   . Cyst, ovarian   . Diabetes mellitus, type II ([place])   . Endometriosis   . Esophageal stricture   . Gastroparesis   . GERD (gastroesophageal reflux disease)   . Heart murmur   . Hyperlipidemia   . Hypertension   . 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 Iron deficiency anemia   . Migraine headache   . Non-compliance   . Peptic ulcer disease   . Thyroid disease      Social History   Socioeconomic History  . Marital status: Widowed    Spouse name: Not on  file  . Number of children: 2  . Years of education: Not on file  . Highest education level: Not on file  Occupational History  . Occupation: DISABILITY    Employer: UNEMPLOYED  Tobacco Use  . Smoking status: Former Smoker    Packs/day: 0.50    Years: 18.00    Pack years: 9.00    Types: Cigarettes    Start date: 07/29/1977    Quit date: 06/25/1994    Years since quitting: 25.1  . Smokeless tobacco: Never Used  . Tobacco comment: quit 19 years ago  Substance and Sexual Activity  . Alcohol use: Yes    Alcohol/week: 0.0 standard drinks    Comment: social drinker  . Drug use: No  . Sexual activity: Not Currently  Other Topics Concern  . Not on file  Social History Narrative   Widowed   Has 2 grown children.  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   Former Smoker Quit tobacco in 1996.  She was a pack a day smoker for approximately 10 years.   Alcohol use-yes: Social    Daily Caffeine Use:6 pack of pepsi daily     Illicit Drug Use - no    Patient does not get  regular exercise.       Smoking Status:  quit   Social Determinants of Health   Financial Resource Strain:   . Difficulty of Paying Living Expenses: Not on file  Food Insecurity:   . Worried About Charity fundraiser in the Last Year: Not on file  . Ran Out of Food in the Last Year: Not on file  Transportation Needs:   . Lack of Transportation (Medical): Not on file  . Lack of Transportation (Non-Medical): Not on file  Physical Activity:   . Days of Exercise per Week: Not on file  . Minutes of Exercise per Session: Not on file  Stress:   . Feeling of Stress : Not on file  Social Connections:   . Frequency of Communication with Friends and Family: Not on file  . Frequency of Social Gatherings with Friends and Family: Not on file  . Attends Religious Services: Not on file  . Active Member of Clubs or Organizations: Not on file  . Attends Archivist Meetings: Not  on file  . Marital Status: Not on file  Intimate Partner Violence:   . Fear of Current or Ex-Partner: Not on file  . Emotionally Abused: Not on file  . Physically Abused: Not on file  . Sexually Abused: Not on file    Past Surgical History:  Procedure Laterality Date  . ABDOMINAL EXPLORATION SURGERY     w/bso   . BREAST BIOPSY    . CARDIAC CATHETERIZATION  2009   mild non obstructive CAD  . CHOLECYSTECTOMY    . COLONOSCOPY    . KNEE SURGERY  2005    left knee  . LEFT HEART CATH AND CORONARY ANGIOGRAPHY N/A 07/17/2017   Procedure: LEFT HEART CATH AND CORONARY ANGIOGRAPHY;  Surgeon: Martinique, Peter M, MD;  Location: [facility];  Service: Cardiovascular;  Laterality: N/A;  . POLYPECTOMY    . TOTAL ABDOMINAL HYSTERECTOMY    . UPPER GASTROINTESTINAL ENDOSCOPY      Family History  Problem Relation Age of Onset  . Hypertension Mother   . Rheum arthritis Mother   . Hypertension Father   . Diabetes Father   . Prostate cancer Father   . Kidney disease Father   . Diabetes Sister   . Fibromyalgia Sister   . Diabetes Maternal Grandmother   . Lung cancer Maternal Grandfather   . Arthritis Brother   . Migraines Brother   . CAD Brother   . Fibromyalgia Sister   . Migraines Sister   . Colon cancer Neg Hx   . Thyroid disease Neg Hx   . Colon polyps Neg Hx     Allergies  Allergen Reactions  . Ace Inhibitors     REACTION: chronic cough  . Celebrex [Celecoxib] Other (See Comments)    "makes me bleed"  . Diovan [Valsartan]     angioedema    Current Outpatient Medications on File Prior to Visit  Medication Sig Dispense Refill  . Albuterol Sulfate 108 (90 Base) MCG/ACT AEPB Inhale 2 puffs into the lungs 4 (four) times daily. 1 each 3  . Alirocumab (PRALUENT) 150 MG/ML SOAJ Inject 1 pen into the skin every 14 (fourteen) days. 2 pen 11  . amLODipine (NORVASC) 10 MG tablet Take 1 tablet by mouth once daily 90 tablet 1  . aspirin 81 MG chewable tablet Chew 1 tablet (81 mg  total) by mouth daily.    . cyclobenzaprine (FLEXERIL) 10 MG tablet Take 1 tablet (10  mg total) by mouth 2 (two) times daily as needed for muscle spasms. 20 tablet 0  . fluticasone (FLONASE) 50 MCG/ACT nasal spray Place 2 sprays into both nostrils daily. 16 g 1  . methimazole (TAPAZOLE) 5 MG tablet Take 1 tablet by mouth once daily 90 tablet 0  . metoprolol succinate (TOPROL XL) 25 MG 24 hr tablet Take 1 tablet (25 mg total) by mouth daily. 90 tablet 3  . ondansetron (ZOFRAN) 4 MG tablet Take 1 tab by mouth every 6 hours as needed for nausea. 100 tablet 1  . polyethylene glycol (MIRALAX / GLYCOLAX) packet Take 17 g by mouth 2 (two) times daily. (Patient taking differently: Take 17 g by mouth daily as needed for mild constipation. ) 14 each 0  . potassium chloride SA (K-DUR) 20 MEQ tablet Take 1 tablet (20 mEq total) by mouth daily. 30 tablet 11  . rosuvastatin (CRESTOR) 40 MG tablet Take 1 tablet (40 mg total) by mouth every other day. 45 tablet 1  . SUMAtriptan (IMITREX) 50 MG tablet Take 50 mg by mouth every 2 (two) hours as needed for migraine. May repeat in 2 hours if headache persists or recurs.    . [DISCONTINUED] atorvastatin (LIPITOR) 40 MG tablet Take 1 tablet (40 mg total) by mouth daily. 30 tablet 3   Current Facility-Administered Medications on File Prior to Visit  Medication Dose Route Frequency Provider Last Rate Last Admin  . 0.9 %  sodium chloride infusion  500 mL Intravenous Once Armbruster, Carlota Raspberry, MD        BP 129/64 (BP Location: Right Arm, Patient Position: Sitting, Cuff Size: Large)   Pulse (!) 59   Temp 98.1 F (36.7 C) (Oral)   Resp 16   Ht 5' 5.5" (1.664 m)   Wt 223 lb (101.2 kg)   SpO2 100%   BMI 36.54 kg/m        Objective:   Physical Exam Constitutional:      Appearance: She is well-developed.  Neck:     Thyroid: No thyromegaly.  Cardiovascular:     Rate and Rhythm: Normal rate and regular rhythm.     Heart sounds: Normal heart sounds. No murmur.    Pulmonary:     Effort: Pulmonary effort is normal. No respiratory distress.     Breath sounds: Normal breath sounds. No wheezing.  Musculoskeletal:     Cervical back: Neck supple.  Skin:    General: Skin is warm and dry.  Neurological:     Mental Status: She is alert and oriented to person, place, and time.  Psychiatric:        Behavior: Behavior normal.        Thought Content: Thought content normal.        Judgment: Judgment normal.           Assessment & Plan:  Diabetes type 2-will obtain follow-up A1c.  Continue diabetic diet.  Hypertension-blood pressure stable on current regimen.  Continue same.  History of migraines-currently stable.  Continue Imitrex as needed.  Chronic back pain-known degenerative disks in the lumbar spine.  Will refer back to Dr. Maryjean Ka for ongoing management.  Hyperlipidemia-LDL at goal.  Will ontinue Crestor. Defer Praluent management per cardiology.  Dysphagia-also notes early satiety.  Suspect early satiety is most likely related to her history of gastroparesis.  Will refer back to her gastroenterologist for further evaluation.  Elevated alk phos level-this was noted on lab work performed by cardiology.  Will check alk phos  isoenzymes for further evaluation.  Vitamin D deficiency-will obtain follow-up vitamin D level.  This visit occurred during the SARS-CoV-2 public health emergency.  Safety protocols were in place, including screening questions prior to the visit, additional usage of staff PPE, and extensive cleaning of exam room while observing appropriate contact time as indicated for disinfecting solutions.

## 2019-07-28 NOTE — Progress Notes (Signed)
Hematology and Oncology Follow Up Visit  [name] [id number] Jul 22, 1960 59 y.o. 07/28/2019   Principle Diagnosis:  Recurrent iron deficiency anemia  Current Therapy:   IV iron as indicated    Interim History:  [name] is here today for follow-up. She is doing well but has noticed an increase in fatigue.  She has not noted any blood loss. No bruising or petechiae.  No fever, chills, n/v, cough, rash, dizziness, SOB, chest pain, palpitations, abdominal pain or changes in bowel or bladder habits.  No swelling or tenderness in her extremities.  She does have tingling in her right fingertips due to chronic neck issues. She states that she has been having right neck and shoulder spasms.  No falls or syncope.  She states that her appetite comes and goes. She does feel that she is hydrating well. Her weight is stable.   ECOG Performance Status: 1 - Symptomatic but completely ambulatory  Medications:  Allergies as of 07/28/2019      Reactions   Ace Inhibitors    REACTION: chronic cough   Celebrex [celecoxib] Other (See Comments)   "makes me bleed"   Diovan [valsartan]    angioedema      Medication List       Accurate as of July 28, 2019 10:29 AM. If you have any questions, ask your nurse or doctor.        STOP taking these medications   benzonatate 100 MG capsule Commonly known as: TESSALON Stopped by: [name], NP     TAKE these medications   Albuterol Sulfate 108 (90 Base) MCG/ACT Aepb Inhale 2 puffs into the lungs 4 (four) times daily.   amLODipine 10 MG tablet Commonly known as: NORVASC Take 1 tablet by mouth once daily   aspirin 81 MG chewable tablet Chew 1 tablet (81 mg total) by mouth daily.   cyclobenzaprine 10 MG tablet Commonly known as: FLEXERIL Take 1 tablet (10 mg total) by mouth 2 (two) times daily as needed for muscle spasms.   fluticasone 50 MCG/ACT nasal spray Commonly known as: FLONASE Place 2 sprays into both nostrils  daily.   methimazole 5 MG tablet Commonly known as: TAPAZOLE Take 1 tablet by mouth once daily   metoprolol succinate 25 MG 24 hr tablet Commonly known as: Toprol XL Take 1 tablet (25 mg total) by mouth daily.   ondansetron 4 MG tablet Commonly known as: ZOFRAN Take 1 tab by mouth every 6 hours as needed for nausea.   polyethylene glycol 17 g packet Commonly known as: MIRALAX / GLYCOLAX Take 17 g by mouth 2 (two) times daily. What changed:   when to take this  reasons to take this   potassium chloride SA 20 MEQ tablet Commonly known as: KLOR-CON Take 1 tablet (20 mEq total) by mouth daily.   Praluent 150 MG/ML Soaj Generic drug: Alirocumab Inject 1 pen into the skin every 14 (fourteen) days.   rosuvastatin 40 MG tablet Commonly known as: CRESTOR Take 1 tablet (40 mg total) by mouth every other day.   SUMAtriptan 50 MG tablet Commonly known as: IMITREX Take 50 mg by mouth every 2 (two) hours as needed for migraine. May repeat in 2 hours if headache persists or recurs.       Allergies:  Allergies  Allergen Reactions  . Ace Inhibitors     REACTION: chronic cough  . Celebrex [Celecoxib] Other (See Comments)    "makes me bleed"  . Diovan [Valsartan]  angioedema    Past Medical History, Surgical history, Social history, and Family History were reviewed and updated.  Review of Systems: All other 10 point review of systems is negative.   Physical Exam:  vitals were not taken for this visit.   Wt Readings from Last 3 Encounters:  07/28/19 223 lb (101.2 kg)  07/10/19 223 lb (101.2 kg)  04/29/19 225 lb 9.6 oz (102.3 kg)    Ocular: Sclerae unicteric, pupils equal, round and reactive to light Ear-nose-throat: Oropharynx clear, dentition fair Lymphatic: No cervical or supraclavicular adenopathy Lungs no rales or rhonchi, good excursion bilaterally Heart regular rate and rhythm, no murmur appreciated Abd soft, nontender, positive bowel sounds, no liver or  spleen tip palpated on exam, no fluid wave  MSK no focal spinal tenderness, no joint edema Neuro: non-focal, well-oriented, appropriate affect Breasts: Deferred   Lab Results  Component Value Date   WBC 7.6 07/28/2019   HGB 10.1 (L) 07/28/2019   HCT 34.5 (L) 07/28/2019   MCV 74.8 (L) 07/28/2019   PLT 263 07/28/2019   Lab Results  Component Value Date   FERRITIN 659.1 (H) 04/21/2019   IRON 54 04/21/2019   TIBC 262 04/21/2019   UIBC 186 11/21/2018   IRONPCTSAT 21 04/21/2019   Lab Results  Component Value Date   RETICCTPCT 0.9 07/28/2019   RBC 4.61 07/28/2019   RBC 4.61 07/28/2019   RETICCTABS 48.5 04/08/2015   No results found for: Nils Pyle [facility] Lab Results  Component Value Date   IGA 305 05/04/2009   Lab Results  Component Value Date   TOTALPROTELP 7.2 05/04/2009   ALBUMINELP 49.1 (L) 05/04/2009   A1GS 8.3 (H) 05/04/2009   A2GS 14.1 (H) 05/04/2009   BETS 6.6 05/04/2009   GAMS 16.4 05/04/2009   MSPIKE NOT DET 05/04/2009     Chemistry      Component Value Date/Time   NA 140 04/06/2019 1745   NA 143 02/12/2019 1229   NA 139 12/14/2016 0849   K 3.9 04/06/2019 1745   K 3.7 12/14/2016 0849   CL 106 04/06/2019 1745   CL 99 08/16/2015 1055   CO2 25 04/06/2019 1745   CO2 25 12/14/2016 0849   BUN 18 04/06/2019 1745   BUN 10 02/12/2019 1229   BUN 11.4 12/14/2016 0849   CREATININE 0.78 04/06/2019 1745   CREATININE 0.85 11/21/2018 0842   CREATININE 0.9 12/14/2016 0849      Component Value Date/Time   CALCIUM 9.3 04/06/2019 1745   CALCIUM 9.6 12/14/2016 0849   ALKPHOS 215 (H) 07/21/2019 0756   ALKPHOS 135 12/14/2016 0849   AST 36 07/21/2019 0756   AST 17 11/21/2018 0842   AST 22 12/14/2016 0849   ALT 82 (H) 07/21/2019 0756   ALT 20 11/21/2018 0842   ALT 32 12/14/2016 0849   BILITOT 0.3 07/21/2019 0756   BILITOT 0.4 11/21/2018 0842   BILITOT 0.29 12/14/2016 0849       Impression and Plan: [name] is a very pleasant 59yo  African American female with history of iron deficiency anemia.  She is symptomatic with fatigue. Hgb is 10.1, MCV 74.  We will see what her iron studies look like and replace if needed.  We will plan to see her in another 6 months.  She will contact our office with any questions or concerns. We can certainly see her sooner if needed.   Laverna Peace, NP 2/2/202110:29 AM

## 2019-07-28 NOTE — Patient Instructions (Signed)
Please complete lab work prior to leaving.   

## 2019-07-30 LAB — VITAMIN D 1,25 DIHYDROXY
Vitamin D 1, 25 (OH)2 Total: 87 pg/mL — ABNORMAL HIGH (ref 18–72)
Vitamin D2 1, 25 (OH)2: <8 pg/mL
Vitamin D3 1, 25 (OH)2: 87 pg/mL

## 2019-07-31 ENCOUNTER — Other Ambulatory Visit: Payer: Self-pay

## 2019-07-31 ENCOUNTER — Inpatient Hospital Stay: Payer: Medicare Other

## 2019-07-31 VITALS — BP 122/64 | HR 64 | Temp 99.3°F | Resp 17

## 2019-07-31 DIAGNOSIS — D509 Iron deficiency anemia, unspecified: Secondary | ICD-10-CM | POA: Diagnosis not present

## 2019-07-31 DIAGNOSIS — D500 Iron deficiency anemia secondary to blood loss (chronic): Secondary | ICD-10-CM

## 2019-07-31 DIAGNOSIS — Z79899 Other long term (current) drug therapy: Secondary | ICD-10-CM | POA: Diagnosis not present

## 2019-07-31 DIAGNOSIS — M542 Cervicalgia: Secondary | ICD-10-CM | POA: Diagnosis not present

## 2019-07-31 DIAGNOSIS — R5383 Other fatigue: Secondary | ICD-10-CM | POA: Diagnosis not present

## 2019-07-31 MED ORDER — SODIUM CHLORIDE 0.9 % IV SOLN
200.0000 mg | Freq: Once | INTRAVENOUS | Status: AC
  Administered 2019-07-31: 200 mg via INTRAVENOUS
  Filled 2019-07-31: qty 200

## 2019-07-31 MED ORDER — SODIUM CHLORIDE 0.9 % IV SOLN
Freq: Once | INTRAVENOUS | Status: AC
  Filled 2019-07-31: qty 250

## 2019-07-31 NOTE — Patient Instructions (Signed)

## 2019-08-03 ENCOUNTER — Other Ambulatory Visit: Payer: Medicare Other

## 2019-08-03 ENCOUNTER — Other Ambulatory Visit: Payer: Self-pay | Admitting: Emergency Medicine

## 2019-08-03 DIAGNOSIS — R748 Abnormal levels of other serum enzymes: Secondary | ICD-10-CM

## 2019-08-03 NOTE — Addendum Note (Signed)
Addended by: Caffie Pinto on: 08/03/2019 03:05 PM   Modules accepted: Orders

## 2019-08-04 ENCOUNTER — Inpatient Hospital Stay: Payer: Medicare Other

## 2019-08-04 ENCOUNTER — Ambulatory Visit: Payer: Medicare Other | Attending: Internal Medicine

## 2019-08-04 ENCOUNTER — Other Ambulatory Visit: Payer: Self-pay

## 2019-08-04 VITALS — BP 141/69 | HR 58 | Temp 97.5°F | Resp 18

## 2019-08-04 DIAGNOSIS — D509 Iron deficiency anemia, unspecified: Secondary | ICD-10-CM | POA: Diagnosis not present

## 2019-08-04 DIAGNOSIS — R5383 Other fatigue: Secondary | ICD-10-CM | POA: Diagnosis not present

## 2019-08-04 DIAGNOSIS — Z79899 Other long term (current) drug therapy: Secondary | ICD-10-CM | POA: Diagnosis not present

## 2019-08-04 DIAGNOSIS — Z20822 Contact with and (suspected) exposure to covid-19: Secondary | ICD-10-CM | POA: Diagnosis not present

## 2019-08-04 DIAGNOSIS — M542 Cervicalgia: Secondary | ICD-10-CM | POA: Diagnosis not present

## 2019-08-04 DIAGNOSIS — D500 Iron deficiency anemia secondary to blood loss (chronic): Secondary | ICD-10-CM

## 2019-08-04 MED ORDER — SODIUM CHLORIDE 0.9 % IV SOLN
Freq: Once | INTRAVENOUS | Status: AC
  Filled 2019-08-04: qty 250

## 2019-08-04 MED ORDER — SODIUM CHLORIDE 0.9 % IV SOLN
200.0000 mg | Freq: Once | INTRAVENOUS | Status: AC
  Administered 2019-08-04: 200 mg via INTRAVENOUS
  Filled 2019-08-04: qty 200

## 2019-08-04 NOTE — Patient Instructions (Signed)

## 2019-08-05 ENCOUNTER — Telehealth: Payer: Self-pay | Admitting: Family

## 2019-08-05 LAB — NOVEL CORONAVIRUS, NAA: SARS-CoV-2, NAA: NOT DETECTED

## 2019-08-05 NOTE — Telephone Encounter (Signed)
Vit D level is now too high. Is she taking any vit D supplements? If so, she should discontinue.   Sugar has risen, please work on diabetic diet, exercise, weight loss.

## 2019-08-06 ENCOUNTER — Telehealth: Payer: Self-pay | Admitting: Internal Medicine

## 2019-08-06 ENCOUNTER — Telehealth: Payer: Self-pay

## 2019-08-06 NOTE — Telephone Encounter (Signed)
PT DAUGHTER CALLED LOOKING FOR NEW PROVIDER FOR MOTHER..PT NEEDED SOMETHING SOONER THAN WE HAD AS WE ARE NOT OPEN ON FRIDAYS.

## 2019-08-06 NOTE — Telephone Encounter (Signed)

## 2019-08-07 ENCOUNTER — Telehealth: Payer: Self-pay | Admitting: Family

## 2019-08-07 ENCOUNTER — Other Ambulatory Visit: Payer: Self-pay

## 2019-08-07 ENCOUNTER — Inpatient Hospital Stay: Payer: Medicare Other

## 2019-08-07 VITALS — BP 121/73 | HR 63 | Temp 96.4°F | Resp 17

## 2019-08-07 DIAGNOSIS — R748 Abnormal levels of other serum enzymes: Secondary | ICD-10-CM

## 2019-08-07 DIAGNOSIS — C787 Secondary malignant neoplasm of liver and intrahepatic bile duct: Secondary | ICD-10-CM

## 2019-08-07 DIAGNOSIS — Z79899 Other long term (current) drug therapy: Secondary | ICD-10-CM | POA: Diagnosis not present

## 2019-08-07 DIAGNOSIS — M542 Cervicalgia: Secondary | ICD-10-CM | POA: Diagnosis not present

## 2019-08-07 DIAGNOSIS — D500 Iron deficiency anemia secondary to blood loss (chronic): Secondary | ICD-10-CM

## 2019-08-07 DIAGNOSIS — R5383 Other fatigue: Secondary | ICD-10-CM | POA: Diagnosis not present

## 2019-08-07 DIAGNOSIS — D509 Iron deficiency anemia, unspecified: Secondary | ICD-10-CM | POA: Diagnosis not present

## 2019-08-07 LAB — ALKALINE PHOSPHATASE ISOENZYMES
Alkaline phosphatase (APISO): 250 U/L — ABNORMAL HIGH (ref 37–153)
Bone Isoenzymes: 5 % — ABNORMAL LOW (ref 28–66)
Intestinal Isoenzymes: 3 % (ref 1–24)
Liver Isoenzymes: 68 % (ref 25–69)
Macrohepatic isoenzymes: 24 % — ABNORMAL HIGH

## 2019-08-07 MED ORDER — SODIUM CHLORIDE 0.9 % IV SOLN
200.0000 mg | Freq: Once | INTRAVENOUS | Status: AC
  Administered 2019-08-07: 200 mg via INTRAVENOUS
  Filled 2019-08-07: qty 10

## 2019-08-07 MED ORDER — SODIUM CHLORIDE 0.9 % IV SOLN
Freq: Once | INTRAVENOUS | Status: AC
  Filled 2019-08-07: qty 250

## 2019-08-07 NOTE — Telephone Encounter (Signed)
Advised patient of results and scheduled her for additional labs for Monday.

## 2019-08-07 NOTE — Patient Instructions (Signed)

## 2019-08-07 NOTE — Telephone Encounter (Signed)
Please advise pt that her liver test is still elevated. I would like for her to complete a CT scan to further evaluate her liver as well as return to the lab for some additional blood tests.   Lastly, I am going to refer her to a liver specialist for further evaluation.

## 2019-08-09 NOTE — Progress Notes (Signed)
Virtual Visit via Telephone Note   This visit type was conducted due to national recommendations for restrictions regarding the COVID-19 Pandemic (e.g. social distancing) in an effort to limit this patient's exposure and mitigate transmission in our community.  Due to her co-morbid illnesses, this patient is at least at moderate risk for complications without adequate follow up.  This format is felt to be most appropriate for this patient at this time.  The patient did not have access to video technology/had technical difficulties with video requiring transitioning to audio format only (telephone).  All issues noted in this document were discussed and addressed.  No physical exam could be performed with this format.  Please refer to the patient's chart for her  consent to telehealth for [facility].   Date:  08/10/2019   ID:  [name], DOB 1960-12-24, MRN [id number]  Patient Location: Home Provider Location: Office  PCP:  Denise Alar, NP  Cardiologist:  Harrington Challenger  Evaluation Performed:  Follow-Up Visit  Chief Complaint:    History of Present Illness:    [name] is a 59 y.o. female with a history of CAD Minimal CAD at cath 2009  FHx of CAD  Remote tobacco  Seen by [name] in 2013 She wa admitted in Jan 2019  with CP and vomiting  After outpt EGD/dilitation  Trop positive  Cardiac cath done showed 75% mid PDA (small vessel)  Prox RCA with 25%  OM 50%  LVEF normal  Focal wall motion abnormality  ? Effect of spasm   I saw the pt back in March 2019  She was al ittle fatigued at time     The pt says her breathing OK  No CP   No edema Pt still has problems with swallowing  Followed in GI \ Also, she is coming back to lab today to recheck liver function     The patient does not: have symptoms concerning for COVID-19 infection (fever, chills, cough, or new shortness of breath).    Past Medical History:  Diagnosis Date  . Allergy    allergic rhinitis  . Arthritis    . Atypical chest pain   . Constipation   . Cyst, ovarian   . Diabetes mellitus, type II ([place])   . Endometriosis   . Esophageal stricture   . Gastroparesis   . GERD (gastroesophageal reflux disease)   . Heart murmur   . Hyperlipidemia   . Hypertension   . 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 Iron deficiency anemia   . Migraine headache   . Non-compliance   . Peptic ulcer disease   . Thyroid disease    Past Surgical History:  Procedure Laterality Date  . ABDOMINAL EXPLORATION SURGERY     w/bso   . BREAST BIOPSY    . CARDIAC CATHETERIZATION  2009   mild non obstructive CAD  . CHOLECYSTECTOMY    . COLONOSCOPY    . KNEE SURGERY  2005    left knee  . LEFT HEART CATH AND CORONARY ANGIOGRAPHY N/A 07/17/2017   Procedure: LEFT HEART CATH AND CORONARY ANGIOGRAPHY;  Surgeon: Martinique, Peter M, MD;  Location: [facility];  Service: Cardiovascular;  Laterality: N/A;  . POLYPECTOMY    . TOTAL ABDOMINAL HYSTERECTOMY    . UPPER GASTROINTESTINAL ENDOSCOPY       Current Meds  Medication Sig  . Alirocumab (PRALUENT) 150 MG/ML SOAJ Inject 1 pen into  the skin every 14 (fourteen) days.  Marland Kitchen amLODipine (NORVASC) 10 MG tablet Take 1 tablet by mouth once daily  . aspirin 81 MG chewable tablet Chew 1 tablet (81 mg total) by mouth daily.  . cyclobenzaprine (FLEXERIL) 10 MG tablet Take 1 tablet (10 mg total) by mouth 2 (two) times daily as needed for muscle spasms.  . fluticasone (FLONASE) 50 MCG/ACT nasal spray Place 2 sprays into both nostrils daily.  . methimazole (TAPAZOLE) 5 MG tablet Take 1 tablet by mouth once daily  . metoprolol succinate (TOPROL XL) 25 MG 24 hr tablet Take 1 tablet (25 mg total) by mouth daily.  . ondansetron (ZOFRAN) 4 MG tablet Take 1 tab by mouth every 6 hours as needed for nausea.  . polyethylene glycol (MIRALAX / GLYCOLAX) packet Take 17 g by mouth 2 (two) times daily. (Patient taking differently: Take 17 g  by mouth daily as needed for mild constipation. )  . potassium chloride SA (K-DUR) 20 MEQ tablet Take 1 tablet (20 mEq total) by mouth daily.  . rosuvastatin (CRESTOR) 40 MG tablet Take 1 tablet (40 mg total) by mouth every other day.  . SUMAtriptan (IMITREX) 50 MG tablet Take 50 mg by mouth every 2 (two) hours as needed for migraine. May repeat in 2 hours if headache persists or recurs.   Current Facility-Administered Medications for the 08/10/19 encounter (Telemedicine) with Fay Records, MD  Medication  . 0.9 %  sodium chloride infusion     Allergies:   Ace inhibitors, Celebrex [celecoxib], and Diovan [valsartan]   Social History   Tobacco Use  . Smoking status: Former Smoker    Packs/day: 0.50    Years: 18.00    Pack years: 9.00    Types: Cigarettes    Start date: 07/29/1977    Quit date: 06/25/1994    Years since quitting: 25.1  . Smokeless tobacco: Never Used  . Tobacco comment: quit 19 years ago  Substance Use Topics  . Alcohol use: Yes    Alcohol/week: 0.0 standard drinks    Comment: social drinker  . Drug use: No     Family Hx: The patient's family history includes Arthritis in her brother; CAD in her brother; Diabetes in her father, maternal grandmother, and sister; Fibromyalgia in her sister and sister; Hypertension in her father and mother; Kidney disease in her father; Lung cancer in her maternal grandfather; Migraines in her brother and sister; Prostate cancer in her father; Rheum arthritis in her mother. There is no history of Colon cancer, Thyroid disease, or Colon polyps.  ROS:   Please see the history of present illness.    All other systems reviewed and are negative.   Prior CV studies:   The following studies were reviewed today:   Labs/Other Tests and Data Reviewed:    EKG:  No ECG reviewed.  Recent Labs: 04/07/2019: TSH 2.87 07/28/2019: ALT 32; BUN 13; Creatinine, Ser 0.75; Hemoglobin 10.1; Platelet Count 263; Potassium 4.1; Sodium 138   Recent  Lipid Panel Lab Results  Component Value Date/Time   CHOL 140 07/21/2019 07:56 AM   TRIG 122 07/21/2019 07:56 AM   TRIG 103 04/30/2006 09:49 AM   HDL 44 07/21/2019 07:56 AM   CHOLHDL 3.2 07/21/2019 07:56 AM   CHOLHDL 6.7 07/17/2017 03:07 PM   LDLCALC 74 07/21/2019 07:56 AM   LDLDIRECT 76 07/21/2019 07:56 AM   LDLDIRECT 131.0 10/31/2016 03:55 PM    Wt Readings from Last 3 Encounters:  08/10/19 223 lb (101.2  kg)  07/28/19 223 lb (101.2 kg)  07/28/19 223 lb (101.2 kg)     Objective:    Vital Signs:  Ht 5\' 5"  (1.651 m)   Wt 223 lb (101.2 kg)   BMI 37.11 kg/m    VITAL SIGNS:  reviewed No vital signs today  ASSESSMENT & PLAN:    1. CAD  No symptoms of CP   Continue to follow  2  HL  Currently on Praluent and Crestor   Lipids are better  Due to have labs today to look at liver function  3  HTN  BP appears to be fairly well controlled   COVID-19 Education: The signs and symptoms of COVID-19 were discussed with the patient and how to seek care for testing (follow up with PCP or arrange E-visit).  The importance of social distancing was discussed today.  Time:   Today, I have spent 20 minutes with the patient with telehealth technology discussing the above problems.     Medication Adjustments/Labs and Tests Ordered: Current medicines are reviewed at length with the patient today.  Concerns regarding medicines are outlined above.   Tests Ordered: No orders of the defined types were placed in this encounter.   Medication Changes: No orders of the defined types were placed in this encounter.   Follow Up:  6 months in person  Signed, [name], MD  08/10/2019 9:37 AM    [place]

## 2019-08-10 ENCOUNTER — Telehealth: Payer: Self-pay | Admitting: Family

## 2019-08-10 ENCOUNTER — Other Ambulatory Visit (INDEPENDENT_AMBULATORY_CARE_PROVIDER_SITE_OTHER): Payer: Medicare Other

## 2019-08-10 ENCOUNTER — Telehealth (INDEPENDENT_AMBULATORY_CARE_PROVIDER_SITE_OTHER): Payer: Medicare Other | Admitting: Internal Medicine

## 2019-08-10 ENCOUNTER — Inpatient Hospital Stay: Payer: Medicare Other

## 2019-08-10 ENCOUNTER — Encounter: Payer: Self-pay | Admitting: Internal Medicine

## 2019-08-10 ENCOUNTER — Other Ambulatory Visit: Payer: Self-pay

## 2019-08-10 VITALS — BP 129/81 | HR 64 | Temp 96.8°F | Resp 17

## 2019-08-10 DIAGNOSIS — D500 Iron deficiency anemia secondary to blood loss (chronic): Secondary | ICD-10-CM

## 2019-08-10 DIAGNOSIS — I2510 Atherosclerotic heart disease of native coronary artery without angina pectoris: Secondary | ICD-10-CM

## 2019-08-10 DIAGNOSIS — D509 Iron deficiency anemia, unspecified: Secondary | ICD-10-CM | POA: Diagnosis not present

## 2019-08-10 DIAGNOSIS — M542 Cervicalgia: Secondary | ICD-10-CM | POA: Diagnosis not present

## 2019-08-10 DIAGNOSIS — R748 Abnormal levels of other serum enzymes: Secondary | ICD-10-CM | POA: Diagnosis not present

## 2019-08-10 DIAGNOSIS — E782 Mixed hyperlipidemia: Secondary | ICD-10-CM | POA: Diagnosis not present

## 2019-08-10 DIAGNOSIS — R5383 Other fatigue: Secondary | ICD-10-CM | POA: Diagnosis not present

## 2019-08-10 DIAGNOSIS — Z79899 Other long term (current) drug therapy: Secondary | ICD-10-CM | POA: Diagnosis not present

## 2019-08-10 MED ORDER — SODIUM CHLORIDE 0.9 % IV SOLN
200.0000 mg | Freq: Once | INTRAVENOUS | Status: AC
  Administered 2019-08-10: 200 mg via INTRAVENOUS
  Filled 2019-08-10: qty 10

## 2019-08-10 MED ORDER — SODIUM CHLORIDE 0.9 % IV SOLN
Freq: Once | INTRAVENOUS | Status: AC
  Filled 2019-08-10: qty 250

## 2019-08-10 NOTE — Patient Instructions (Signed)

## 2019-08-10 NOTE — Progress Notes (Signed)
Pt declined to stay for post infusion observation period. Pt verbalized understanding to seek emergency treatment if needed and had no further questions. Pt left ambulatory in no apparent distress.

## 2019-08-10 NOTE — Telephone Encounter (Addendum)
Is she taking any vitamins with vit D?  Her vitamin D level was a little high.  If so- please stop.    Also- Sugar has risen, please work on diabetic diet, exercise, weight loss.

## 2019-08-10 NOTE — Telephone Encounter (Signed)
See additional phone note. 

## 2019-08-10 NOTE — Patient Instructions (Signed)
Medication Instructions:  Your physician recommends that you continue on your current medications as directed. Please refer to the Current Medication list given to you today.  *If you need a refill on your cardiac medications before your next appointment, please call your pharmacy*  Lab Work: NONE  If you have labs (blood work) drawn today and your tests are completely normal, you will receive your results only by: Marland Kitchen MyChart Message (if you have MyChart) OR . A paper copy in the mail If you have any lab test that is abnormal or we need to change your treatment, we will call you to review the results.  Testing/Procedures: NONE  Follow-Up: At [facility], you and your health needs are our priority.  As part of our continuing mission to provide you with exceptional heart care, we have created designated Provider Care Teams.  These Care Teams include your primary Cardiologist (physician) and Advanced Practice Providers (APPs -  Physician Assistants and Nurse Practitioners) who all work together to provide you with the care you need, when you need it.  Your next appointment:   6 month(s)  The format for your next appointment:   In Person  Provider:   [name], MD  Other Instructions Thank you for choosing [facility]!

## 2019-08-10 NOTE — Telephone Encounter (Signed)
Called but no answer, and mail box was full. Will try again tomorrow

## 2019-08-11 ENCOUNTER — Ambulatory Visit (HOSPITAL_BASED_OUTPATIENT_CLINIC_OR_DEPARTMENT_OTHER)
Admission: RE | Admit: 2019-08-11 | Discharge: 2019-08-11 | Disposition: A | Discharge status: Home / Self Care | Payer: Medicare Other | Source: Ambulatory Visit | Attending: Family | Admitting: Family

## 2019-08-11 DIAGNOSIS — D1803 Hemangioma of intra-abdominal structures: Secondary | ICD-10-CM | POA: Diagnosis not present

## 2019-08-11 DIAGNOSIS — K760 Fatty (change of) liver, not elsewhere classified: Secondary | ICD-10-CM | POA: Insufficient documentation

## 2019-08-11 DIAGNOSIS — R748 Abnormal levels of other serum enzymes: Secondary | ICD-10-CM | POA: Diagnosis not present

## 2019-08-11 LAB — HEPATITIS PANEL, ACUTE
Hep A IgM: NONREACTIVE
Hep B C IgM: NONREACTIVE
Hepatitis B Surface Ag: NONREACTIVE
Hepatitis C Ab: NONREACTIVE
SIGNAL TO CUT-OFF: 0.17 (ref <1.00)

## 2019-08-11 MED ORDER — IOHEXOL 300 MG/ML  SOLN
100.0000 mL | Freq: Once | INTRAMUSCULAR | Status: AC | PRN
  Administered 2019-08-11: 15:00:00 100 mL via INTRAVENOUS

## 2019-08-11 NOTE — Telephone Encounter (Signed)
Again ,called patient, was able to leave message.

## 2019-08-11 NOTE — Telephone Encounter (Signed)
Pt daughter called about new patient appt but nothing available in time frame needed

## 2019-08-14 ENCOUNTER — Other Ambulatory Visit (HOSPITAL_BASED_OUTPATIENT_CLINIC_OR_DEPARTMENT_OTHER): Payer: Medicare Other

## 2019-08-14 ENCOUNTER — Telehealth: Payer: Self-pay | Admitting: Family

## 2019-08-14 DIAGNOSIS — R748 Abnormal levels of other serum enzymes: Secondary | ICD-10-CM

## 2019-08-14 NOTE — Telephone Encounter (Signed)
Please advise pt that her CT of her liver shows a fatty liver as well as a hemangioma (like a ball of blood vessels). She should work on a low fat/low cholesterol diet, exercise and weight loss. I would also like for her to meet with hepatology (liver specialist).  I have pended the referral.

## 2019-08-17 NOTE — Telephone Encounter (Signed)
Patient advised of results and provider's advise. Referral signed.

## 2019-08-21 ENCOUNTER — Other Ambulatory Visit: Payer: Medicare Other | Admitting: *Deleted

## 2019-08-21 ENCOUNTER — Other Ambulatory Visit: Payer: Self-pay

## 2019-08-21 DIAGNOSIS — R945 Abnormal results of liver function studies: Secondary | ICD-10-CM | POA: Diagnosis not present

## 2019-08-21 DIAGNOSIS — R7989 Other specified abnormal findings of blood chemistry: Secondary | ICD-10-CM

## 2019-08-21 DIAGNOSIS — E785 Hyperlipidemia, unspecified: Secondary | ICD-10-CM | POA: Diagnosis not present

## 2019-08-21 LAB — GAMMA GT: GGT: 147 IU/L — ABNORMAL HIGH (ref 0–60)

## 2019-08-25 ENCOUNTER — Other Ambulatory Visit: Payer: Self-pay | Admitting: *Deleted

## 2019-08-26 ENCOUNTER — Encounter: Payer: Self-pay | Admitting: Gastroenterology

## 2019-08-26 ENCOUNTER — Other Ambulatory Visit (INDEPENDENT_AMBULATORY_CARE_PROVIDER_SITE_OTHER): Payer: Medicare Other

## 2019-08-26 ENCOUNTER — Ambulatory Visit (INDEPENDENT_AMBULATORY_CARE_PROVIDER_SITE_OTHER): Payer: Medicare Other | Admitting: Gastroenterology

## 2019-08-26 VITALS — BP 118/68 | HR 78 | Temp 97.9°F | Ht 64.0 in | Wt 224.4 lb

## 2019-08-26 DIAGNOSIS — R748 Abnormal levels of other serum enzymes: Secondary | ICD-10-CM | POA: Diagnosis not present

## 2019-08-26 DIAGNOSIS — K219 Gastro-esophageal reflux disease without esophagitis: Secondary | ICD-10-CM

## 2019-08-26 DIAGNOSIS — K760 Fatty (change of) liver, not elsewhere classified: Secondary | ICD-10-CM | POA: Diagnosis not present

## 2019-08-26 DIAGNOSIS — R1310 Dysphagia, unspecified: Secondary | ICD-10-CM

## 2019-08-26 DIAGNOSIS — K5909 Other constipation: Secondary | ICD-10-CM

## 2019-08-26 DIAGNOSIS — R6881 Early satiety: Secondary | ICD-10-CM

## 2019-08-26 LAB — HEPATIC FUNCTION PANEL
ALT: 14 U/L (ref 0–35)
AST: 12 U/L (ref 0–37)
Albumin: 3.9 g/dL (ref 3.5–5.2)
Alkaline Phosphatase: 165 U/L — ABNORMAL HIGH (ref 39–117)
Bilirubin, Direct: 0.2 mg/dL (ref 0.0–0.3)
Total Bilirubin: 0.5 mg/dL (ref 0.2–1.2)
Total Protein: 7.8 g/dL (ref 6.0–8.3)

## 2019-08-26 MED ORDER — POLYETHYLENE GLYCOL 3350 17 G PO PACK: 17.0000 g | PACK | Freq: Two times a day (BID) | ORAL | 0 refills | Status: DC

## 2019-08-26 MED ORDER — DEXILANT 60 MG PO CPDR: 60.0000 mg | DELAYED_RELEASE_CAPSULE | Freq: Every day | ORAL | 0 refills | Status: DC

## 2019-08-26 NOTE — Patient Instructions (Signed)
If you are age 61 or older, your body mass index should be between 23-30. Your Body mass index is 38.52 kg/m. If this is out of the aforementioned range listed, please consider follow up with your Primary Care Provider.  If you are age 30 or younger, your body mass index should be between 19-25. Your Body mass index is 38.52 kg/m. If this is out of the aformentioned range listed, please consider follow up with your Primary Care Provider.   Please go to the lab in the basement of our building to have lab work done as you leave today. Hit "B" for basement when you get on the elevator.  When the doors open the lab is on your left.  We will call you with the results. Thank you.  You have been scheduled for a Barium Esophogram at [facility] (1st floor of the hospital) on Friday, 09-04-19 at 10:30am. Please arrive 15 minutes prior to your appointment for registration. Make certain not to have anything to eat or drink 3 hours prior to your test. If you need to reschedule for any reason, please contact radiology at [phone] to do so. __________________________________________________________________ A barium swallow is an examination that concentrates on views of the esophagus. This tends to be a double contrast exam (barium and two liquids which, when combined, create a gas to distend the wall of the oesophagus) or single contrast (non-ionic iodine based). The study is usually tailored to your symptoms so a good history is essential. Attention is paid during the study to the form, structure and configuration of the esophagus, looking for functional disorders (such as aspiration, dysphagia, achalasia, motility and reflux) EXAMINATION You may be asked to change into a gown, depending on the type of swallow being performed. A radiologist and radiographer will perform the procedure. The radiologist will advise you of the type of contrast selected for your procedure and direct you during the exam. You  will be asked to stand, sit or lie in several different positions and to hold a small amount of fluid in your mouth before being asked to swallow while the imaging is performed .In some instances you may be asked to swallow barium coated marshmallows to assess the motility of a solid food bolus. The exam can be recorded as a digital or video fluoroscopy procedure. POST PROCEDURE It will take 1-2 days for the barium to pass through your system. To facilitate this, it is important, unless otherwise directed, to increase your fluids for the next 24-48hrs and to resume your normal diet.  This test typically takes about 30 minutes to perform. __________________________________________________________________________________   We have given you samples of the following medication to take: Dexilant 60 mg: Take once a day FDGard: Take as directed.  If you find this helpful you can purchase more over the counter.  Due to recent changes in healthcare laws, you may see the results of your imaging and laboratory studies on MyChart before your provider has had a chance to review them.  We understand that in some cases there may be results that are confusing or concerning to you. Not all laboratory results come back in the same time frame and the provider may be waiting for multiple results in order to interpret others.  Please give [place] 48 hours in order for your provider to thoroughly review all the results before contacting the office for clarification of your results.    Thank you for entrusting me with your care and for choosing [facility], Dr. Hawkins Cellar

## 2019-08-26 NOTE — Progress Notes (Signed)
HPI :  59 year old female here for a follow-up visit for dysphagia, reflux, early satiety, elevated alkaline phosphatase, chronic constipation, fatty liver.  Her main complaint today is dysphagia reflux symptoms.  She states she has had ongoing longstanding dysphagia to mostly solids but occasionally liquids as well.  This occurs in her chest area and feels it on most days.  She is able to eat and tolerate her meals, symptoms are mild but persistent.  She has some ongoing pyrosis that bothers her as well as early satiety.  She does have occasional vomiting but states she feels full after eating just a few bites.  She has a history of prediabetes but does not take any medications for this.  She has not been taking any antacids recently.  She is been using Alka-Seltzer periodically which she does state provides some benefit to her reflux.  She has had chronic constipation and using milk of magnesia and MiraLAX as needed.  She does not move her bowels very frequently and uses the MiraLAX maybe 2 times a week.  This seems to help when she takes it.  She does not use anything routinely.  No blood in her stools.   I have seen her for some of these issues in the past.  I performed an EGD for the symptoms in January 2019.  At that time she was noted to have a small hiatal hernia and a benign very mild stricture at the [place] J.  The stricture was ballooned to 20 mm but no mucosal rent was noted.  Post procedure she had severe chest pain.  She was actually mid to the hospital, there is no evidence of damage to her esophagus but she did have troponin elevation and was evaluated by cardiology, thought to have a vasospastic cardiac event that led to the symptoms.  She states she did not have any significant benefit from the dilation that her symptoms have mostly persisted over the time.  She states she has been on some antacids remotely but not recently.  In the past she states Prilosec has not provided any benefit for  her.  In regards to her early satiety this is been a chronic issue as well.  She had a gastric emptying study in 2010 which was abnormal with Dr. Deatra Ina.  She was given Reglan which did not help, tried on erythromycin which did not help, and then tried on domperidone which did not help.  She states that none of these regimes provided any benefit for her.  A follow-up gastric emptying study in 2011 which was normal.  She is otherwise had an elevation in her alkaline phosphatase level which is new since have seen her.  Prior imaging studies of her liver have shown a large hepatic hemangioma which has been stable on imaging studies in recent years.  She also has some hepatic steatosis noted.  Her most recent CT scan was performed in February 2021 as below.  Hepatic and angioma stable in size.  No biliary ductal dilation   Elevated AP of 250, GGT 147 - AP rising over time, prevously 130s, ongoing since 2020 Hep panel negative Chronic anemia with microcytosis despite normal iron  Prior work-up CT 08/12/19 -  IMPRESSION: Stable appearing hepatic hemangioma. 8.4 cm in size, mild fatty infiltration  Fatty liver.   Flex sig 04/10/2018 - Preparation of the colon was poor, inadquate for assessing for polyps. - Of the visualized mucosa in the left colon, it appeared normal without any inflammatory  changes. Biopsied. Hervey Ard / angulated turn in the sigmoid colon Overall, ischemic colitis remains possible to account for prior symptoms although nothing overtly appreciated on today's exam.  EGD 1/22/20219 - Esophagogastric landmarks identified. - 2 cm hiatal hernia. - One suspected benign-appearing esophageal stenosis. Dilated to 21mm without mucosal wrents. - Normal stomach. - A single suspected duodenal polyp versus less likely ampulla. Biopsied.  Colonoscopy 07/16/2017 - The perianal and digital rectal examinations were normal. - The terminal ileum appeared normal. - The colon was tortuous. -  A few medium-mouthed diverticula were found in the sigmoid colon. - Internal hemorrhoids were found during retroflexion. - The exam was otherwise without abnormality.  GES abnormal 03/07/09 - delayed GES normal 02/03/10  Past Medical History:  Diagnosis Date  . Allergy    allergic rhinitis  . Arthritis   . Atypical chest pain   . Constipation   . Cyst, ovarian   . Diabetes mellitus, type II ([place])   . Endometriosis   . Esophageal stricture   . Gastroparesis   . GERD (gastroesophageal reflux disease)   . Heart murmur   . Hyperlipidemia   . Hypertension   . 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 Iron deficiency anemia   . Migraine headache   . Non-compliance   . Peptic ulcer disease   . Thyroid disease      Past Surgical History:  Procedure Laterality Date  . ABDOMINAL EXPLORATION SURGERY     w/bso   . BREAST BIOPSY    . CARDIAC CATHETERIZATION  2009   mild non obstructive CAD  . CHOLECYSTECTOMY    . COLONOSCOPY    . KNEE SURGERY  2005    left knee  . LEFT HEART CATH AND CORONARY ANGIOGRAPHY N/A 07/17/2017   Procedure: LEFT HEART CATH AND CORONARY ANGIOGRAPHY;  Surgeon: Martinique, Peter M, MD;  Location: [facility];  Service: Cardiovascular;  Laterality: N/A;  . POLYPECTOMY    . TOTAL ABDOMINAL HYSTERECTOMY    . UPPER GASTROINTESTINAL ENDOSCOPY     Family History  Problem Relation Age of Onset  . Hypertension Mother   . Rheum arthritis Mother   . Hypertension Father   . Diabetes Father   . Prostate cancer Father   . Kidney disease Father   . Diabetes Sister   . Fibromyalgia Sister   . Diabetes Maternal Grandmother   . Lung cancer Maternal Grandfather   . Arthritis Brother   . Migraines Brother   . CAD Brother   . Fibromyalgia Sister   . Migraines Sister   . Colon cancer Neg Hx   . Thyroid disease Neg Hx   . Colon polyps Neg Hx    Social History   Tobacco Use  . Smoking status: Former Smoker     Packs/day: 0.50    Years: 18.00    Pack years: 9.00    Types: Cigarettes    Start date: 07/29/1977    Quit date: 06/25/1994    Years since quitting: 25.1  . Smokeless tobacco: Never Used  . Tobacco comment: quit 19 years ago  Substance Use Topics  . Alcohol use: Yes    Alcohol/week: 0.0 standard drinks    Comment: social drinker  . Drug use: No   Current Outpatient Medications  Medication Sig Dispense Refill  . Albuterol Sulfate 108 (90 Base) MCG/ACT AEPB Inhale 2 puffs into the lungs 4 (four) times daily. 1 each 3  . Alirocumab (  PRALUENT) 150 MG/ML SOAJ Inject 1 pen into the skin every 14 (fourteen) days. 2 pen 11  . amLODipine (NORVASC) 10 MG tablet Take 1 tablet by mouth once daily 90 tablet 1  . aspirin 81 MG chewable tablet Chew 1 tablet (81 mg total) by mouth daily.    . cyclobenzaprine (FLEXERIL) 10 MG tablet Take 1 tablet (10 mg total) by mouth 2 (two) times daily as needed for muscle spasms. 20 tablet 0  . fluticasone (FLONASE) 50 MCG/ACT nasal spray Place 2 sprays into both nostrils daily. 16 g 1  . methimazole (TAPAZOLE) 5 MG tablet Take 1 tablet by mouth once daily 90 tablet 0  . metoprolol succinate (TOPROL XL) 25 MG 24 hr tablet Take 1 tablet (25 mg total) by mouth daily. 90 tablet 3  . ondansetron (ZOFRAN) 4 MG tablet Take 1 tab by mouth every 6 hours as needed for nausea. 100 tablet 1  . polyethylene glycol (MIRALAX / GLYCOLAX) packet Take 17 g by mouth 2 (two) times daily. (Patient taking differently: Take 17 g by mouth daily as needed for mild constipation. ) 14 each 0  . potassium chloride SA (K-DUR) 20 MEQ tablet Take 1 tablet (20 mEq total) by mouth daily. 30 tablet 11  . SUMAtriptan (IMITREX) 50 MG tablet Take 50 mg by mouth every 2 (two) hours as needed for migraine. May repeat in 2 hours if headache persists or recurs.     Current Facility-Administered Medications  Medication Dose Route Frequency Provider Last Rate Last Admin  . 0.9 %  sodium chloride infusion   500 mL Intravenous Once Aleen Marston, Carlota Raspberry, MD       Allergies  Allergen Reactions  . Ace Inhibitors     REACTION: chronic cough  . Celebrex [Celecoxib] Other (See Comments)    "makes me bleed"  . Diovan [Valsartan]     angioedema     Review of Systems: All systems reviewed and negative except where noted in HPI.    CT Abdomen Pelvis W Contrast  Result Date: 08/12/2019 CLINICAL DATA:  Elevated alkaline phosphatase level with left-sided abdominal pain EXAM: CT ABDOMEN AND PELVIS WITH CONTRAST TECHNIQUE: Multidetector CT imaging of the abdomen and pelvis was performed using the standard protocol following bolus administration of intravenous contrast. CONTRAST:  186mL OMNIPAQUE IOHEXOL 300 MG/ML  SOLN COMPARISON:  03/17/2018 FINDINGS: Lower chest: No acute abnormality. Hepatobiliary: There is again noted a hypodense mass lesion within posterior aspect of the right lobe of the liver measuring approximately 8.4 cm in greatest dimension with peripheral nodular enhancement and persistent enhancement on delayed images consistent with hemangioma. The remainder of the liver demonstrates mild fatty infiltration. The gallbladder has been surgically removed. Pancreas: Unremarkable. No pancreatic ductal dilatation or surrounding inflammatory changes. Spleen: Normal in size without focal abnormality. Adrenals/Urinary Tract: Adrenal glands are within normal limits. Kidneys are well visualized within normal enhancement pattern. No renal calculi are seen. No obstructive changes are noted. The bladder is partially distended. Stomach/Bowel: The appendix is within normal limits. No obstructive or inflammatory changes of the colon are seen. The small bowel is within normal limits. Patulous air-filled distal esophagus is seen. Vascular/Lymphatic: Aortic atherosclerosis. No enlarged abdominal or pelvic lymph nodes. Reproductive: Status post hysterectomy. No adnexal masses. Other: No abdominal wall hernia or  abnormality. No abdominopelvic ascites. Musculoskeletal: Degenerative changes of lumbar spine are noted. IMPRESSION: Stable appearing hepatic hemangioma. Fatty liver. Chronic changes as described. Electronically Signed   By: Inez Catalina M.D.   On:  08/12/2019 08:21    Physical Exam: BP 118/68   Pulse 78   Temp 97.9 F (36.6 C)   Ht 5\' 4"  (1.626 m)   Wt 224 lb 6.4 oz (101.8 kg)   BMI 38.52 kg/m  Constitutional: Pleasant,well-developed, female in no acute distress.   ASSESSMENT AND PLAN: 59 year old female here for reassessment of the following issues:  Dysphagia / GERD - as above, prior EGD showed a very small hiatal hernia and a very mild stenosis of the GE J which was dilated but did not provide any relief.  Unfortunately she had a coronary vasospasm around the time of her endoscopy and got admitted to the hospital.  Dilation did not provide much of any benefit for her in the past.  Suspect she could have a motility disorder perhaps causing both of these issues, perhaps made worse if she does have gastroparesis as below.  Discussed options with her.  I recommend a barium swallow to get a sense of her motility and see if we can localize where her dysphagia is occurring.  We discussed this and she want to proceed.  In the interim she has never been on high-dose PPI from what she endorses, will give her a free sample of Dexilant 60 mg a day for 2 weeks and see if that provides some benefit for her reflux as she denies any benefit on a variety of lower dose PPIs in the past.  Will await her course and results of barium study  Early satiety - conflicting gastric emptying studies in the past regarding presence of gastroparesis.  That being said she has been tried on Reglan, erythromycin, and domperidone in the past without any benefit at all.  Her EGD did not show any concerning abnormality, she likely has a functional disorder, dyspepsia.  We will give her a trial of FD guard to see if that provides  any benefit to the symptoms.  She agreed  Elevated AP / Fatty liver - large hepatic hemangioma which appears stable in size over time, and I do not think causing symptoms.  She does have a fairly new persistent elevation in alkaline phosphatase of unclear etiology.  Recent CT scan does not show any biliary ductal dilation or compression of the biliary tree from the hemangioma.  We will check some labs today, repeat LFTs, send AMA, IgG, ANA, smooth muscle antibody to ensure normal.  If enzymes continue to rise may consider MRCP and or a liver biopsy pending the level and her course.   Chronic constipation - ongoing longstanding constipation, she is not using MiraLAX frequently but it tends to help when she takes it.  Recommend MiraLAX every day to twice daily and titrate as directed.  She can contact me if no improvement will discuss other options.  I spent 35 minutes of time, including in depth chart review, independent review of results as outlined above, communicating results with the patient directly, face-to-face time with the patient, coordinating care, and ordering studies and medications as appropriate, and documenting this encounter.   [place] Cellar, MD [facility]

## 2019-08-28 DIAGNOSIS — M5126 Other intervertebral disc displacement, lumbar region: Secondary | ICD-10-CM | POA: Diagnosis not present

## 2019-08-28 DIAGNOSIS — M5416 Radiculopathy, lumbar region: Secondary | ICD-10-CM | POA: Diagnosis not present

## 2019-08-29 LAB — ANTI-NUCLEAR AB-TITER (ANA TITER)
ANA TITER: 1:40 {titer} — ABNORMAL HIGH
ANA Titer 1: 1:320 {titer} — ABNORMAL HIGH

## 2019-08-29 LAB — IGG: IgG (Immunoglobin G), Serum: 1243 mg/dL (ref 600–1640)

## 2019-08-29 LAB — ANA: Anti Nuclear Antibody (ANA): POSITIVE — AB

## 2019-08-29 LAB — ANTI-SMOOTH MUSCLE ANTIBODY, IGG: Actin (Smooth Muscle) Antibody (IGG): <20 U (ref <20)

## 2019-08-29 LAB — MITOCHONDRIAL ANTIBODIES: Mitochondrial M2 Ab, IgG: <=20 U

## 2019-09-03 ENCOUNTER — Other Ambulatory Visit: Payer: Self-pay | Admitting: Endocrinology

## 2019-09-04 ENCOUNTER — Ambulatory Visit (HOSPITAL_COMMUNITY)
Admission: RE | Admit: 2019-09-04 | Discharge: 2019-09-04 | Disposition: A | Discharge status: Home / Self Care | Payer: Medicare Other | Source: Ambulatory Visit | Attending: Gastroenterology | Admitting: Gastroenterology

## 2019-09-04 ENCOUNTER — Other Ambulatory Visit: Payer: Self-pay

## 2019-09-04 DIAGNOSIS — R1310 Dysphagia, unspecified: Secondary | ICD-10-CM

## 2019-09-04 DIAGNOSIS — K760 Fatty (change of) liver, not elsewhere classified: Secondary | ICD-10-CM

## 2019-09-04 DIAGNOSIS — K5909 Other constipation: Secondary | ICD-10-CM | POA: Diagnosis not present

## 2019-09-04 DIAGNOSIS — K219 Gastro-esophageal reflux disease without esophagitis: Secondary | ICD-10-CM

## 2019-09-04 DIAGNOSIS — R933 Abnormal findings on diagnostic imaging of other parts of digestive tract: Secondary | ICD-10-CM

## 2019-09-04 DIAGNOSIS — R748 Abnormal levels of other serum enzymes: Secondary | ICD-10-CM | POA: Diagnosis not present

## 2019-09-04 DIAGNOSIS — R6881 Early satiety: Secondary | ICD-10-CM

## 2019-09-08 ENCOUNTER — Other Ambulatory Visit: Payer: Self-pay

## 2019-09-08 DIAGNOSIS — K760 Fatty (change of) liver, not elsewhere classified: Secondary | ICD-10-CM

## 2019-09-08 DIAGNOSIS — R748 Abnormal levels of other serum enzymes: Secondary | ICD-10-CM

## 2019-09-08 NOTE — Progress Notes (Signed)
Per Dr. Loni Muse LFTs due in mid April

## 2019-10-02 ENCOUNTER — Other Ambulatory Visit: Payer: Self-pay

## 2019-10-06 ENCOUNTER — Ambulatory Visit: Payer: Medicare Other | Admitting: Endocrinology

## 2019-10-08 ENCOUNTER — Ambulatory Visit (INDEPENDENT_AMBULATORY_CARE_PROVIDER_SITE_OTHER): Payer: Medicare Other | Admitting: Endocrinology

## 2019-10-08 ENCOUNTER — Encounter: Payer: Self-pay | Admitting: Endocrinology

## 2019-10-08 ENCOUNTER — Other Ambulatory Visit: Payer: Self-pay

## 2019-10-08 ENCOUNTER — Telehealth: Payer: Self-pay

## 2019-10-08 VITALS — BP 120/80 | HR 60 | Ht 64.0 in | Wt 222.6 lb

## 2019-10-08 DIAGNOSIS — E0590 Thyrotoxicosis, unspecified without thyrotoxic crisis or storm: Secondary | ICD-10-CM | POA: Diagnosis not present

## 2019-10-08 LAB — T4, FREE: Free T4: 0.79 ng/dL (ref 0.60–1.60)

## 2019-10-08 LAB — TSH: TSH: 3.44 u[IU]/mL (ref 0.35–4.50)

## 2019-10-08 NOTE — Patient Instructions (Signed)
Blood tests are requested for you today.  We'll let you know about the results.  If ever you have fever while taking methimazole, stop it and call us, even if the reason is obvious, because of the risk of a rare side-effect.   It is best to never miss the medication.  However, if you do miss it, next best is to double up the next time.   Please come back for a follow-up appointment in 6 months.

## 2019-10-08 NOTE — Telephone Encounter (Signed)
-----   Message from Renato Shin, MD sent at 10/08/2019  2:25 PM EDT ----- please contact patient: Normal.  Please continue the same medication.  I'll see you next time.

## 2019-10-08 NOTE — Telephone Encounter (Signed)
LAB RESULTS  Lab results were reviewed by Dr. Ellison. A letter has been mailed to pt home address. For future reference, letter can be found in Epic. 

## 2019-10-08 NOTE — Progress Notes (Signed)
Subjective:    Patient ID: [name], female    DOB: [birth year], 59 y.o.   MRN: [id number]  HPI Pt returns for f/u of hyperthyroidism (dx'ed 2012; she chose tapazole rx; she has never had thyroid imaging).  pt states she feels well in general.  Specifically, she denies palpitations and tremor.  Past Medical History:  Diagnosis Date  . Allergy    allergic rhinitis  . Arthritis   . Atypical chest pain   . Constipation   . Cyst, ovarian   . Diabetes mellitus, type II ([place])   . Endometriosis   . Esophageal stricture   . Gastroparesis   . GERD (gastroesophageal reflux disease)   . Heart murmur   . Hyperlipidemia   . Hypertension   . 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 Iron deficiency anemia   . Migraine headache   . Non-compliance   . Peptic ulcer disease   . Thyroid disease     Past Surgical History:  Procedure Laterality Date  . ABDOMINAL EXPLORATION SURGERY     w/bso   . BREAST BIOPSY    . CARDIAC CATHETERIZATION  2009   mild non obstructive CAD  . CHOLECYSTECTOMY    . COLONOSCOPY    . KNEE SURGERY  2005    left knee  . LEFT HEART CATH AND CORONARY ANGIOGRAPHY N/A 07/17/2017   Procedure: LEFT HEART CATH AND CORONARY ANGIOGRAPHY;  Surgeon: Martinique, Peter M, MD;  Location: [place] [facility];  Service: Cardiovascular;  Laterality: N/A;  . POLYPECTOMY    . TOTAL ABDOMINAL HYSTERECTOMY    . UPPER GASTROINTESTINAL ENDOSCOPY      Social History   Socioeconomic History  . Marital status: Widowed    Spouse name: Not on file  . Number of children: 2  . Years of education: Not on file  . Highest education level: Not on file  Occupational History  . Occupation: DISABILITY    Employer: UNEMPLOYED  Tobacco Use  . Smoking status: Former Smoker    Packs/day: 0.50    Years: 18.00    Pack years: 9.00    Types: Cigarettes    Start date: 07/29/1977    Quit date: 06/25/1994    Years since quitting: 25.3  .  Smokeless tobacco: Never Used  . Tobacco comment: quit 19 years ago  Substance and Sexual Activity  . Alcohol use: Yes    Alcohol/week: 0.0 standard drinks    Comment: social drinker  . Drug use: No  . Sexual activity: Not Currently  Other Topics Concern  . Not on file  Social History Narrative   Widowed   Has 2 grown children.  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   Former Smoker Quit tobacco in 1996.  She was a pack a day smoker for approximately 10 years.   Alcohol use-yes: Social    Daily Caffeine Use:6 pack of pepsi daily     Illicit Drug Use - no    Patient does not get regular exercise.       Smoking Status:  quit   Social Determinants of Health   Financial Resource Strain:   . Difficulty of Paying Living Expenses:   Food Insecurity:   . Worried About Charity fundraiser in the Last Year:   . Arboriculturist in the Last Year:   Transportation Needs:   . Film/video editor (Medical):   Marland Kitchen  Lack of Transportation (Non-Medical):   Physical Activity:   . Days of Exercise per Week:   . Minutes of Exercise per Session:   Stress:   . Feeling of Stress :   Social Connections:   . Frequency of Communication with Friends and Family:   . Frequency of Social Gatherings with Friends and Family:   . Attends Religious Services:   . Active Member of Clubs or Organizations:   . Attends Archivist Meetings:   Marland Kitchen Marital Status:   Intimate Partner Violence:   . Fear of Current or Ex-Partner:   . Emotionally Abused:   Marland Kitchen Physically Abused:   . Sexually Abused:     Current Outpatient Medications on File Prior to Visit  Medication Sig Dispense Refill  . Albuterol Sulfate 108 (90 Base) MCG/ACT AEPB Inhale 2 puffs into the lungs 4 (four) times daily. 1 each 3  . Alirocumab (PRALUENT) 150 MG/ML SOAJ Inject 1 pen into the skin every 14 (fourteen) days. 2 pen 11  . amLODipine (NORVASC) 10 MG tablet Take 1 tablet by mouth once  daily 90 tablet 1  . aspirin 81 MG chewable tablet Chew 1 tablet (81 mg total) by mouth daily.    Gladis Riffle Oil-Levomenthol (FDGARD PO) Take by mouth. Take as directed    . cyclobenzaprine (FLEXERIL) 10 MG tablet Take 1 tablet (10 mg total) by mouth 2 (two) times daily as needed for muscle spasms. 20 tablet 0  . dexlansoprazole (DEXILANT) 60 MG capsule Take 1 capsule (60 mg total) by mouth daily. LOT: [id number], EXP: 07-2021 10 capsule 0  . fluticasone (FLONASE) 50 MCG/ACT nasal spray Place 2 sprays into both nostrils daily. 16 g 1  . methimazole (TAPAZOLE) 5 MG tablet Take 1 tablet by mouth once daily 90 tablet 0  . metoprolol succinate (TOPROL XL) 25 MG 24 hr tablet Take 1 tablet (25 mg total) by mouth daily. 90 tablet 3  . ondansetron (ZOFRAN) 4 MG tablet Take 1 tab by mouth every 6 hours as needed for nausea. 100 tablet 1  . polyethylene glycol (MIRALAX / GLYCOLAX) 17 g packet Take 17 g by mouth 2 (two) times daily. 14 each 0  . potassium chloride SA (K-DUR) 20 MEQ tablet Take 1 tablet (20 mEq total) by mouth daily. 30 tablet 11  . SUMAtriptan (IMITREX) 50 MG tablet Take 50 mg by mouth every 2 (two) hours as needed for migraine. May repeat in 2 hours if headache persists or recurs.     No current facility-administered medications on file prior to visit.    Allergies  Allergen Reactions  . Ace Inhibitors     REACTION: chronic cough  . Celebrex [Celecoxib] Other (See Comments)    "makes me bleed"  . Diovan [Valsartan]     angioedema    Family History  Problem Relation Age of Onset  . Hypertension Mother   . Rheum arthritis Mother   . Hypertension Father   . Diabetes Father   . Prostate cancer Father   . Kidney disease Father   . Diabetes Sister   . Fibromyalgia Sister   . Diabetes Maternal Grandmother   . Lung cancer Maternal Grandfather   . Arthritis Brother   . Migraines Brother   . CAD Brother   . Fibromyalgia Sister   . Migraines Sister   . Colon cancer Neg Hx   .  Thyroid disease Neg Hx   . Colon polyps Neg Hx     BP 120/80  Pulse 60   Ht 5\' 4"  (1.626 m)   Wt 222 lb 9.6 oz (101 kg)   SpO2 95%   BMI 38.21 kg/m    Review of Systems Denies fever    Objective:   Physical Exam VITAL SIGNS:  See vs page GENERAL: no distress NECK: There is no palpable thyroid enlargement.  No thyroid nodule is palpable.  No palpable lymphadenopathy at the anterior neck.   Lab Results  Component Value Date   TSH 3.44 10/08/2019   T4TOTAL 12.3 11/28/2011      Assessment & Plan:  Hyperthyroidism: well-controlled. Please continue the same medication Please come back for a follow-up appointment in 6 months

## 2019-10-12 ENCOUNTER — Telehealth: Payer: Self-pay

## 2019-10-12 NOTE — Telephone Encounter (Signed)
Called and spoke to pt. Reminded her she is due for lab work Next week. She confirmed she will go one day next week.

## 2019-10-12 NOTE — Telephone Encounter (Signed)
-----   Message from Roetta Sessions, [place] sent at 10/07/2019  9:19 AM EDT ----- Regarding: FW: repeat labs Have pt go for LFTs next week ----- Message ----- From: Elias Else, CMA Sent: 10/07/2019 To: Roetta Sessions, CMA Subject: repeat labs                                    Needs hepatic panel in 6 weeks,  See communication from 09-04-2019 on lab results and radiology report.

## 2019-10-20 ENCOUNTER — Telehealth: Payer: Self-pay | Admitting: Family

## 2019-10-20 ENCOUNTER — Other Ambulatory Visit (INDEPENDENT_AMBULATORY_CARE_PROVIDER_SITE_OTHER): Payer: Medicare Other

## 2019-10-20 DIAGNOSIS — K760 Fatty (change of) liver, not elsewhere classified: Secondary | ICD-10-CM | POA: Diagnosis not present

## 2019-10-20 DIAGNOSIS — R748 Abnormal levels of other serum enzymes: Secondary | ICD-10-CM

## 2019-10-20 LAB — HEPATIC FUNCTION PANEL
ALT: 23 U/L (ref 0–35)
AST: 15 U/L (ref 0–37)
Albumin: 4.1 g/dL (ref 3.5–5.2)
Alkaline Phosphatase: 138 U/L — ABNORMAL HIGH (ref 39–117)
Bilirubin, Direct: 0.1 mg/dL (ref 0.0–0.3)
Total Bilirubin: 0.3 mg/dL (ref 0.2–1.2)
Total Protein: 7.5 g/dL (ref 6.0–8.3)

## 2019-10-20 NOTE — Telephone Encounter (Signed)
Caller : Dollene  Call Back # [phone]  Patient states breast pain in the left breast for two weeks. Patient would like pcp to know   Please advise

## 2019-10-21 NOTE — Telephone Encounter (Signed)
Patient was scheduled to come in for evaluation 5-.03-21

## 2019-10-26 ENCOUNTER — Encounter: Payer: Self-pay | Admitting: Family

## 2019-10-26 ENCOUNTER — Other Ambulatory Visit: Payer: Self-pay

## 2019-10-26 ENCOUNTER — Ambulatory Visit (INDEPENDENT_AMBULATORY_CARE_PROVIDER_SITE_OTHER): Payer: Medicare Other | Admitting: Family

## 2019-10-26 VITALS — BP 144/84 | HR 57 | Temp 97.1°F | Resp 16 | Ht 65.0 in | Wt 224.0 lb

## 2019-10-26 DIAGNOSIS — N644 Mastodynia: Secondary | ICD-10-CM

## 2019-10-26 DIAGNOSIS — L304 Erythema intertrigo: Secondary | ICD-10-CM

## 2019-10-26 DIAGNOSIS — I10 Essential (primary) hypertension: Secondary | ICD-10-CM

## 2019-10-26 DIAGNOSIS — Z0000 Encounter for general adult medical examination without abnormal findings: Secondary | ICD-10-CM

## 2019-10-26 MED ORDER — NYSTATIN 100000 UNIT/GM EX POWD: 1.0000 "application " | Freq: Two times a day (BID) | CUTANEOUS | 1 refills | Status: DC

## 2019-10-26 MED ORDER — SUMATRIPTAN SUCCINATE 50 MG PO TABS: 50.0000 mg | ORAL_TABLET | ORAL | 5 refills | Status: DC | PRN

## 2019-10-26 NOTE — Progress Notes (Signed)
Subjective:    Patient ID: [name], female    DOB: 1961/05/08, 59 y.o.   MRN: [id number]  HPI  Patient is a 59 yr old female with chief complaint of mastalgia R>L. She had a mammogram 07/09/19 which was WNL.  HTN- maintained on amlodipine 10mg  and toprol xl 25mg .    Review of Systems See HPI  Past Medical History:  Diagnosis Date  . Allergy    allergic rhinitis  . Arthritis   . Atypical chest pain   . Constipation   . Cyst, ovarian   . Diabetes mellitus, type II ([place])   . Endometriosis   . Esophageal stricture   . Gastroparesis   . GERD (gastroesophageal reflux disease)   . Heart murmur   . Hyperlipidemia   . Hypertension   . 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 Iron deficiency anemia   . Migraine headache   . Non-compliance   . Peptic ulcer disease   . Thyroid disease      Social History   Socioeconomic History  . Marital status: Widowed    Spouse name: Not on file  . Number of children: 2  . Years of education: Not on file  . Highest education level: Not on file  Occupational History  . Occupation: DISABILITY    Employer: UNEMPLOYED  Tobacco Use  . Smoking status: Former Smoker    Packs/day: 0.50    Years: 18.00    Pack years: 9.00    Types: Cigarettes    Start date: 07/29/1977    Quit date: 06/25/1994    Years since quitting: 25.3  . Smokeless tobacco: Never Used  . Tobacco comment: quit 19 years ago  Substance and Sexual Activity  . Alcohol use: Yes    Alcohol/week: 0.0 standard drinks    Comment: social drinker  . Drug use: No  . Sexual activity: Not Currently  Other Topics Concern  . Not on file  Social History Narrative   Widowed   Has 2 grown children.  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   Former Smoker Quit tobacco in 1996.  She was a pack a day smoker for approximately 10 years.   Alcohol use-yes: Social    Daily Caffeine  Use:6 pack of pepsi daily     Illicit Drug Use - no    Patient does not get regular exercise.       Smoking Status:  quit   Social Determinants of Health   Financial Resource Strain:   . Difficulty of Paying Living Expenses:   Food Insecurity:   . Worried About Charity fundraiser in the Last Year:   . Arboriculturist in the Last Year:   Transportation Needs:   . Film/video editor (Medical):   Marland Kitchen Lack of Transportation (Non-Medical):   Physical Activity:   . Days of Exercise per Week:   . Minutes of Exercise per Session:   Stress:   . Feeling of Stress :   Social Connections:   . Frequency of Communication with Friends and Family:   . Frequency of Social Gatherings with Friends and Family:   . Attends Religious Services:   . Active Member of Clubs or Organizations:   . Attends Archivist Meetings:   Marland Kitchen Marital Status:   Intimate Partner Violence:   . Fear of Current or Ex-Partner:   . Emotionally Abused:   .  Physically Abused:   . Sexually Abused:     Past Surgical History:  Procedure Laterality Date  . ABDOMINAL EXPLORATION SURGERY     w/bso   . BREAST BIOPSY    . CARDIAC CATHETERIZATION  2009   mild non obstructive CAD  . CHOLECYSTECTOMY    . COLONOSCOPY    . KNEE SURGERY  2005    left knee  . LEFT HEART CATH AND CORONARY ANGIOGRAPHY N/A 07/17/2017   Procedure: LEFT HEART CATH AND CORONARY ANGIOGRAPHY;  Surgeon: Martinique, Peter M, MD;  Location: [facility];  Service: Cardiovascular;  Laterality: N/A;  . POLYPECTOMY    . TOTAL ABDOMINAL HYSTERECTOMY    . UPPER GASTROINTESTINAL ENDOSCOPY      Family History  Problem Relation Age of Onset  . Hypertension Mother   . Rheum arthritis Mother   . Hypertension Father   . Diabetes Father   . Prostate cancer Father   . Kidney disease Father   . Diabetes Sister   . Fibromyalgia Sister   . Diabetes Maternal Grandmother   . Lung cancer Maternal Grandfather   . Arthritis Brother   . Migraines  Brother   . CAD Brother   . Fibromyalgia Sister   . Migraines Sister   . Colon cancer Neg Hx   . Thyroid disease Neg Hx   . Colon polyps Neg Hx     Allergies  Allergen Reactions  . Ace Inhibitors     REACTION: chronic cough  . Celebrex [Celecoxib] Other (See Comments)    "makes me bleed"  . Diovan [Valsartan]     angioedema    Current Outpatient Medications on File Prior to Visit  Medication Sig Dispense Refill  . Albuterol Sulfate 108 (90 Base) MCG/ACT AEPB Inhale 2 puffs into the lungs 4 (four) times daily. 1 each 3  . Alirocumab (PRALUENT) 150 MG/ML SOAJ Inject 1 pen into the skin every 14 (fourteen) days. 2 pen 11  . amLODipine (NORVASC) 10 MG tablet Take 1 tablet by mouth once daily 90 tablet 1  . aspirin 81 MG chewable tablet Chew 1 tablet (81 mg total) by mouth daily.    Gladis Riffle Oil-Levomenthol (FDGARD PO) Take by mouth. Take as directed    . cyclobenzaprine (FLEXERIL) 10 MG tablet Take 1 tablet (10 mg total) by mouth 2 (two) times daily as needed for muscle spasms. 20 tablet 0  . dexlansoprazole (DEXILANT) 60 MG capsule Take 1 capsule (60 mg total) by mouth daily. LOT: [id number], EXP: 07-2021 10 capsule 0  . fluticasone (FLONASE) 50 MCG/ACT nasal spray Place 2 sprays into both nostrils daily. 16 g 1  . methimazole (TAPAZOLE) 5 MG tablet Take 1 tablet by mouth once daily 90 tablet 0  . metoprolol succinate (TOPROL XL) 25 MG 24 hr tablet Take 1 tablet (25 mg total) by mouth daily. 90 tablet 3  . ondansetron (ZOFRAN) 4 MG tablet Take 1 tab by mouth every 6 hours as needed for nausea. 100 tablet 1  . polyethylene glycol (MIRALAX / GLYCOLAX) 17 g packet Take 17 g by mouth 2 (two) times daily. 14 each 0  . potassium chloride SA (K-DUR) 20 MEQ tablet Take 1 tablet (20 mEq total) by mouth daily. 30 tablet 11  . SUMAtriptan (IMITREX) 50 MG tablet Take 50 mg by mouth every 2 (two) hours as needed for migraine. May repeat in 2 hours if headache persists or recurs.     No current  facility-administered medications on file prior  to visit.    BP (!) 157/77 (BP Location: Right Arm, Patient Position: Sitting, Cuff Size: Small)   Pulse (!) 57   Temp (!) 97.1 F (36.2 C) (Temporal)   Resp 16   Ht 5\' 5"  (1.651 m)   Wt 224 lb (101.6 kg)   SpO2 100%   BMI 37.28 kg/m       Objective:   Physical Exam Constitutional:      Appearance: Normal appearance.  Skin:    General: Skin is warm and dry.     Comments: Intertrigo noted beneath both breasts.  Neurological:     General: No focal deficit present.     Mental Status: She is alert and oriented to person, place, and time.  Psychiatric:        Mood and Affect: Mood normal.        Behavior: Behavior normal.   breast: Bilateral breast exam is performed and is normal without any palpable masses.  No retractions or thickening is noted.  No lymphadenopathy is noted in either axilla        Assessment & Plan:  Mastalgia-etiology is unclear.  Normal exam.  Will refer for mammogram for further evaluation.  Intertrigo-uncontrolled.  She reports that she ran out of nystatin powder and is requesting a refill.  Refill sent.  Hypertension-repeat blood pressure is better than initial BP upon arrival.  We will continue current meds. BP Readings from Last 3 Encounters:  10/26/19 (!) 144/84  10/08/19 120/80  08/26/19 118/68   This visit occurred during the SARS-CoV-2 public health emergency.  Safety protocols were in place, including screening questions prior to the visit, additional usage of staff PPE, and extensive cleaning of exam room while observing appropriate contact time as indicated for disinfecting solutions.

## 2019-10-26 NOTE — Patient Instructions (Addendum)
You should be contacted about scheduling your mammogram.

## 2019-11-03 ENCOUNTER — Other Ambulatory Visit: Payer: Self-pay | Admitting: Family

## 2019-11-03 IMAGING — DX CHEST - 2 VIEW
2 series · 2 of 2 positions shown · non-contrast
Comparison: 02/04/2018

CLINICAL DATA: Cough fever and congestion for 2 weeks.

EXAM:
CHEST - 2 VIEW

[chest pa]
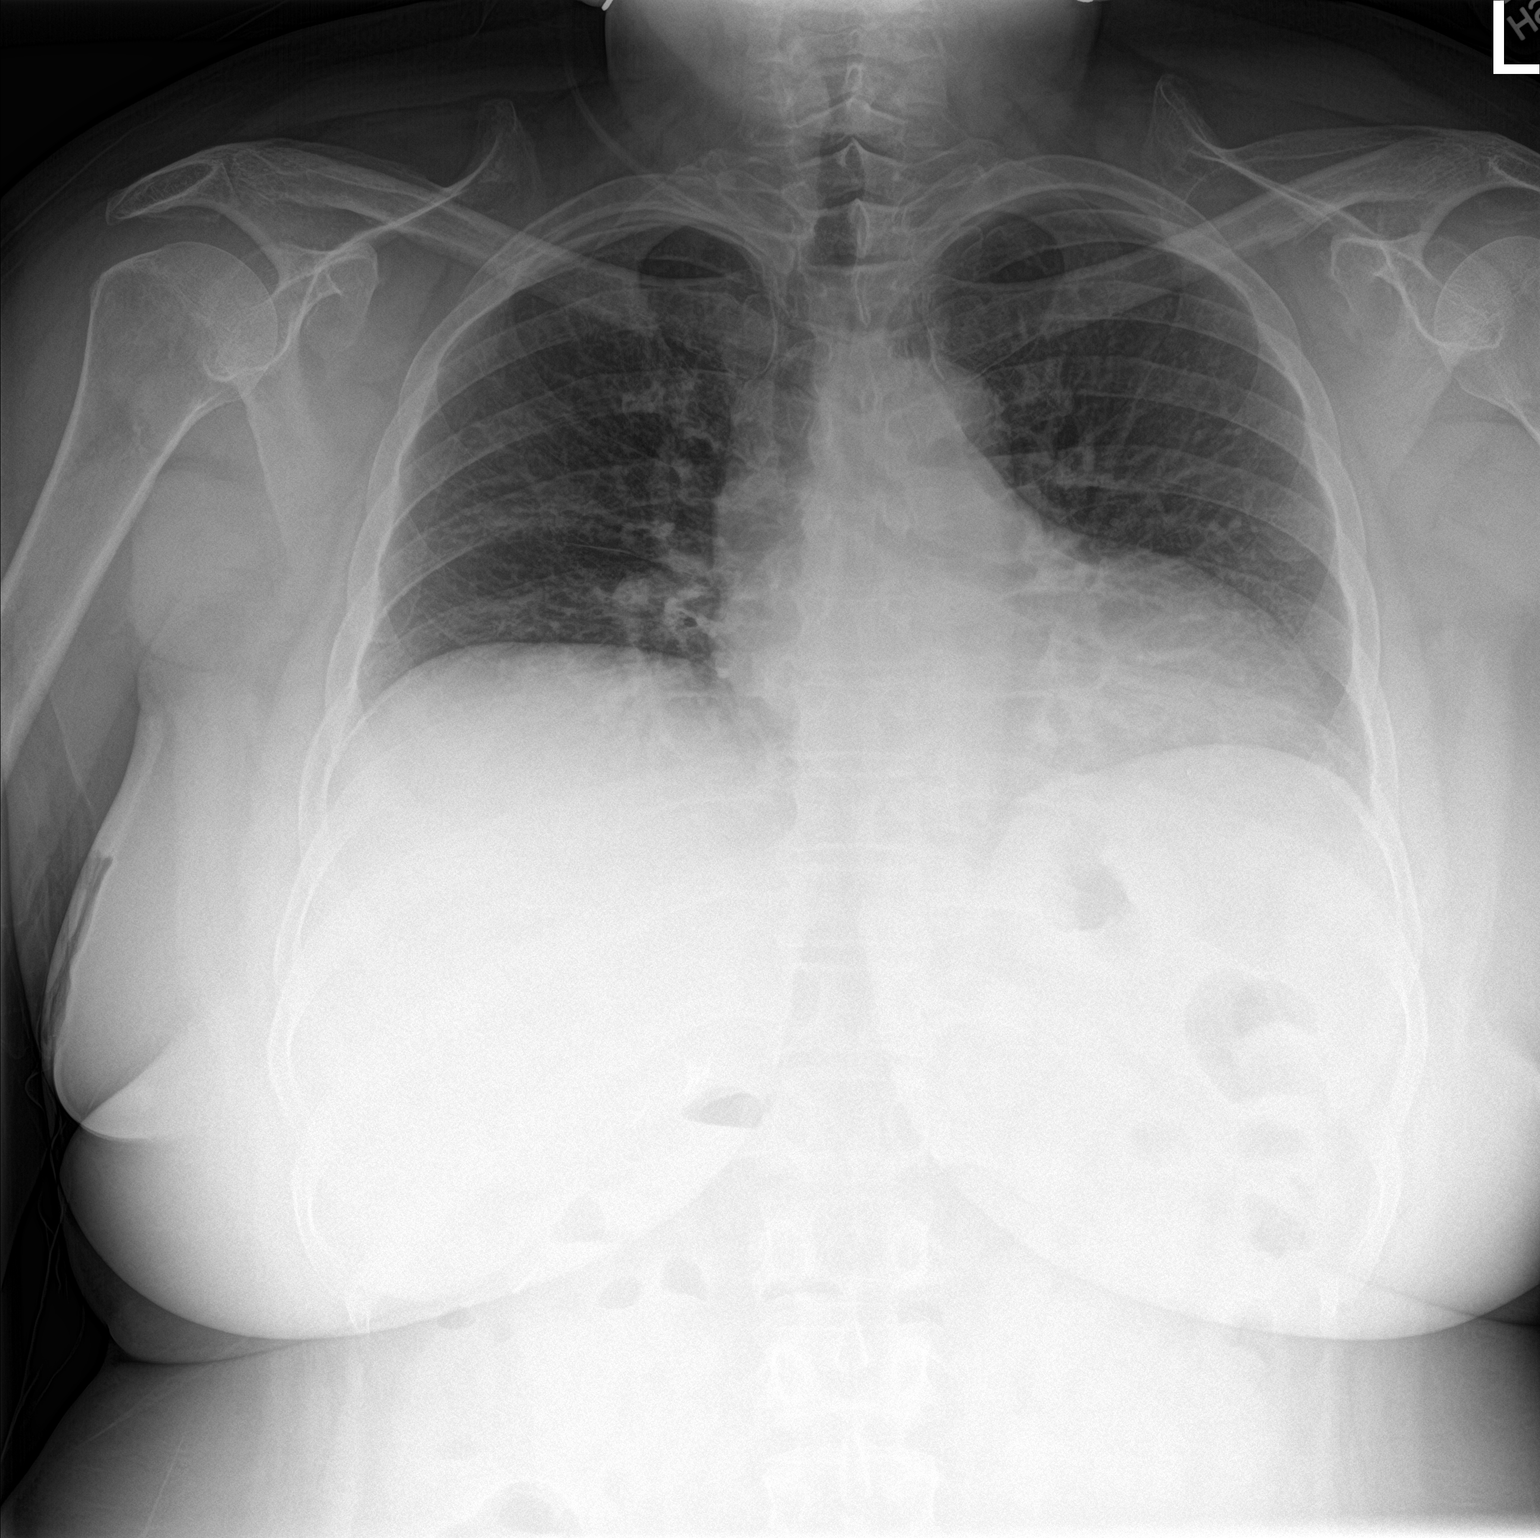

[chest lat]
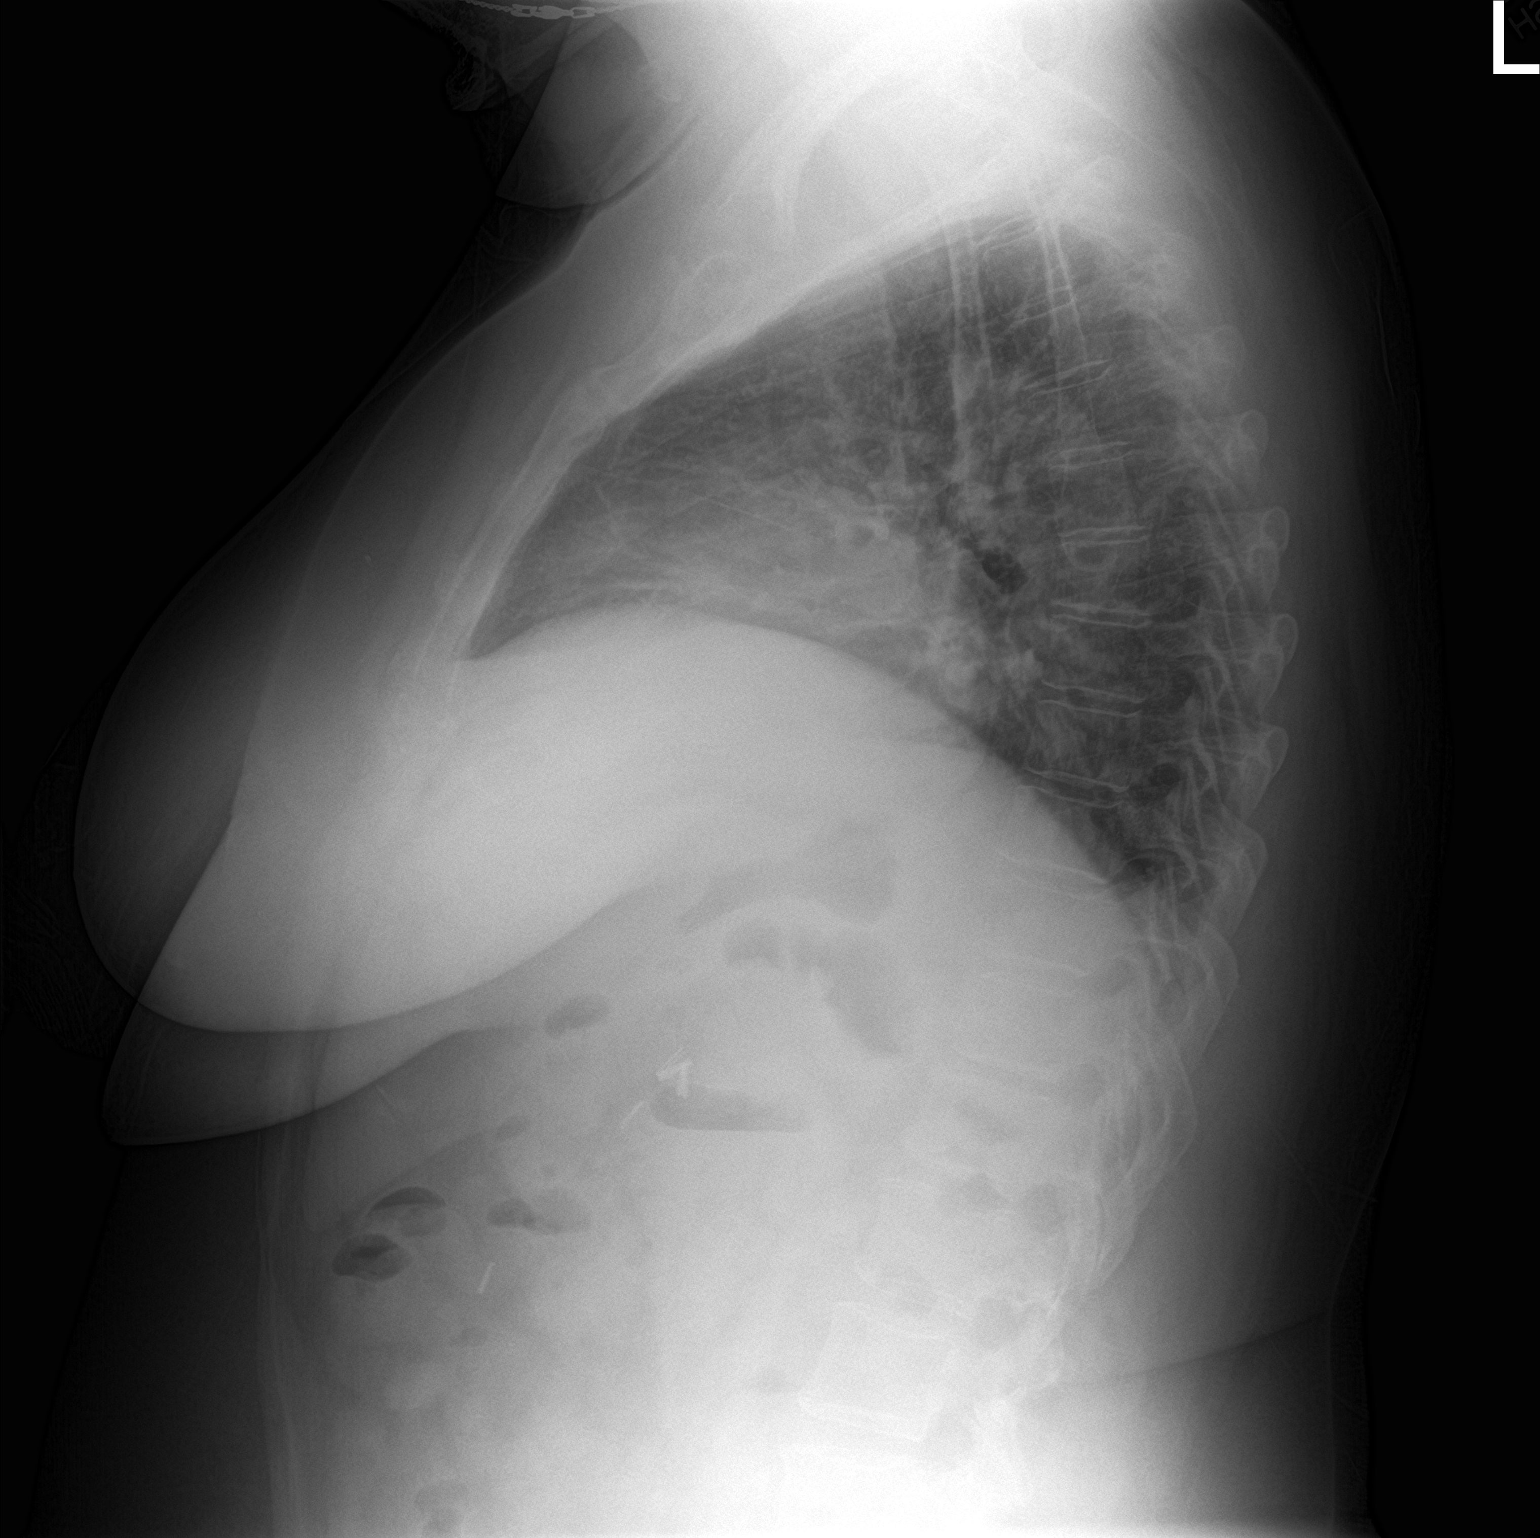

[2 of 2 positions shown; findings below may reference images not displayed]

FINDINGS: Cardiomediastinal silhouette is normal. Mediastinal contours appear
intact.

There is no evidence of focal airspace consolidation, pleural
effusion or pneumothorax. Low lung volumes. Bibasilar atelectasis.

Osseous structures are without acute abnormality. Soft tissues are
grossly normal.
IMPRESSION: 1. Low lung volumes with bibasilar atelectasis.
2. Lungs otherwise clear.

## 2019-11-11 ENCOUNTER — Other Ambulatory Visit: Payer: Self-pay | Admitting: Family

## 2019-11-11 DIAGNOSIS — N644 Mastodynia: Secondary | ICD-10-CM

## 2019-11-26 ENCOUNTER — Ambulatory Visit
Admission: RE | Admit: 2019-11-26 | Discharge: 2019-11-26 | Disposition: A | Discharge status: Home / Self Care | Payer: Medicare Other | Source: Ambulatory Visit | Attending: Family | Admitting: Family

## 2019-11-26 ENCOUNTER — Ambulatory Visit: Payer: Medicare Other

## 2019-11-26 ENCOUNTER — Other Ambulatory Visit: Payer: Self-pay

## 2019-11-26 DIAGNOSIS — R928 Other abnormal and inconclusive findings on diagnostic imaging of breast: Secondary | ICD-10-CM | POA: Diagnosis not present

## 2019-11-26 DIAGNOSIS — N644 Mastodynia: Secondary | ICD-10-CM

## 2019-11-30 ENCOUNTER — Other Ambulatory Visit: Payer: Self-pay | Admitting: Endocrinology

## 2020-01-07 ENCOUNTER — Telehealth: Payer: Self-pay | Admitting: *Deleted

## 2020-01-07 ENCOUNTER — Telehealth: Payer: Self-pay | Admitting: Family

## 2020-01-07 MED ORDER — CYCLOBENZAPRINE HCL 10 MG PO TABS: 10.0000 mg | ORAL_TABLET | Freq: Two times a day (BID) | ORAL | 0 refills | Status: DC | PRN

## 2020-01-07 NOTE — Telephone Encounter (Signed)
Medication: cyclobenzaprine (FLEXERIL) 10 MG tablet  Has the patient contacted their pharmacy? No. (If no, request that the patient contact the pharmacy for the refill.) (If yes, when and what did the pharmacy advise?)  Preferred Pharmacy (with phone number or street name): [facility] ([address]), [place] [facility] [place]  [address]) [place] [address]  Phone:  [phone] Fax:  [phone]  DEA #:  --  Agent: Please be advised that RX refills may take up to 3 business days. We ask that you follow-up with your pharmacy.

## 2020-01-07 NOTE — Telephone Encounter (Signed)
-----   Message from Roetta Sessions, [place] sent at 10/21/2019  4:07 PM EDT ----- Regarding: LFTs due in July Needs LFTs in July

## 2020-01-07 NOTE — Telephone Encounter (Signed)
Please advise if ok to refill. 

## 2020-01-07 NOTE — Telephone Encounter (Signed)
Left message for patient to call back  

## 2020-01-07 NOTE — Addendum Note (Signed)
Addended by: [name] on: 01/07/2020 08:57 PM   Modules accepted: Orders

## 2020-01-11 ENCOUNTER — Other Ambulatory Visit: Payer: Self-pay

## 2020-01-11 DIAGNOSIS — R748 Abnormal levels of other serum enzymes: Secondary | ICD-10-CM

## 2020-01-11 DIAGNOSIS — K760 Fatty (change of) liver, not elsewhere classified: Secondary | ICD-10-CM

## 2020-01-11 NOTE — Progress Notes (Signed)
Patient is due for LFTs in July.  Spoke to pt. She will come to lab one day this week.  Order entered.

## 2020-01-11 NOTE — Telephone Encounter (Signed)
Patient called back. She agreed to go to the lab for LFTs one day this week.

## 2020-01-13 ENCOUNTER — Other Ambulatory Visit (INDEPENDENT_AMBULATORY_CARE_PROVIDER_SITE_OTHER): Payer: Medicare Other

## 2020-01-13 DIAGNOSIS — K760 Fatty (change of) liver, not elsewhere classified: Secondary | ICD-10-CM

## 2020-01-13 DIAGNOSIS — R748 Abnormal levels of other serum enzymes: Secondary | ICD-10-CM | POA: Diagnosis not present

## 2020-01-13 LAB — HEPATIC FUNCTION PANEL
ALT: 20 U/L (ref 0–35)
AST: 15 U/L (ref 0–37)
Albumin: 4 g/dL (ref 3.5–5.2)
Alkaline Phosphatase: 109 U/L (ref 39–117)
Bilirubin, Direct: 0.1 mg/dL (ref 0.0–0.3)
Total Bilirubin: 0.3 mg/dL (ref 0.2–1.2)
Total Protein: 7.3 g/dL (ref 6.0–8.3)

## 2020-01-15 ENCOUNTER — Other Ambulatory Visit: Payer: Self-pay

## 2020-01-15 DIAGNOSIS — R748 Abnormal levels of other serum enzymes: Secondary | ICD-10-CM

## 2020-01-25 ENCOUNTER — Ambulatory Visit: Payer: Medicare Other | Admitting: Family

## 2020-01-25 ENCOUNTER — Other Ambulatory Visit: Payer: Medicare Other

## 2020-01-26 ENCOUNTER — Ambulatory Visit: Payer: Medicare Other | Admitting: Family

## 2020-01-27 ENCOUNTER — Other Ambulatory Visit: Payer: Self-pay

## 2020-01-27 ENCOUNTER — Encounter: Payer: Self-pay | Admitting: Family

## 2020-01-27 ENCOUNTER — Inpatient Hospital Stay: Payer: Medicare Other | Attending: Hematology & Oncology

## 2020-01-27 ENCOUNTER — Inpatient Hospital Stay (HOSPITAL_BASED_OUTPATIENT_CLINIC_OR_DEPARTMENT_OTHER): Payer: Medicare Other | Admitting: Family

## 2020-01-27 VITALS — BP 152/72 | HR 56 | Temp 98.8°F | Resp 18 | Wt 225.1 lb

## 2020-01-27 DIAGNOSIS — D509 Iron deficiency anemia, unspecified: Secondary | ICD-10-CM | POA: Diagnosis not present

## 2020-01-27 DIAGNOSIS — Z79899 Other long term (current) drug therapy: Secondary | ICD-10-CM | POA: Diagnosis not present

## 2020-01-27 DIAGNOSIS — R5383 Other fatigue: Secondary | ICD-10-CM | POA: Insufficient documentation

## 2020-01-27 DIAGNOSIS — D631 Anemia in chronic kidney disease: Secondary | ICD-10-CM

## 2020-01-27 DIAGNOSIS — D500 Iron deficiency anemia secondary to blood loss (chronic): Secondary | ICD-10-CM

## 2020-01-27 LAB — CBC WITH DIFFERENTIAL (CANCER CENTER ONLY)
Abs Immature Granulocytes: 0.1 10*3/uL — ABNORMAL HIGH (ref 0.00–0.07)
Basophils Absolute: 0 10*3/uL (ref 0.0–0.1)
Basophils Relative: 0 %
Eosinophils Absolute: 0.1 10*3/uL (ref 0.0–0.5)
Eosinophils Relative: 1 %
HCT: 42.3 % (ref 36.0–46.0)
Hemoglobin: 12.5 g/dL (ref 12.0–15.0)
Immature Granulocytes: 2 %
Lymphocytes Relative: 35 %
Lymphs Abs: 2.4 10*3/uL (ref 0.7–4.0)
MCH: 22.7 pg — ABNORMAL LOW (ref 26.0–34.0)
MCHC: 29.6 g/dL — ABNORMAL LOW (ref 30.0–36.0)
MCV: 76.8 fL — ABNORMAL LOW (ref 80.0–100.0)
Monocytes Absolute: 0.5 10*3/uL (ref 0.1–1.0)
Monocytes Relative: 7 %
Neutro Abs: 3.8 10*3/uL (ref 1.7–7.7)
Neutrophils Relative %: 55 %
Platelet Count: 231 10*3/uL (ref 150–400)
RBC: 5.51 MIL/uL — ABNORMAL HIGH (ref 3.87–5.11)
RDW: 14 % (ref 11.5–15.5)
WBC Count: 6.8 10*3/uL (ref 4.0–10.5)
nRBC: 0 % (ref 0.0–0.2)

## 2020-01-27 LAB — RETICULOCYTES
Immature Retic Fract: 13.5 % (ref 2.3–15.9)
RBC.: 5.48 MIL/uL — ABNORMAL HIGH (ref 3.87–5.11)
Retic Count, Absolute: 67.4 10*3/uL (ref 19.0–186.0)
Retic Ct Pct: 1.2 % (ref 0.4–3.1)

## 2020-01-27 NOTE — Progress Notes (Signed)
Hematology and Oncology Follow Up Visit  [name] [id number] 08/03/1960 59 y.o. 01/27/2020   Principle Diagnosis:  Recurrent iron deficiency anemia  Current Therapy:        IV iron as indicated   Interim History:  [name] is here today for follow-up. She is doing well but notes fatigue at times.  She stays busy keeping her [name]. No episodes of blood loss noted. No bruising or petechiae.  No fever, chills, n/v, cough, rash, dizziness, SOB, chest pain, palpitations, abdominal pain or changes in bowel or bladder habits.  No swelling, tenderness, numbness or tingling in her extremities.  No falls or syncope.  She is eating well and staying hydrated. Her weight is stable.   ECOG Performance Status: 1 - Symptomatic but completely ambulatory  Medications:  Allergies as of 01/27/2020      Reactions   Ace Inhibitors    REACTION: chronic cough   Celebrex [celecoxib] Other (See Comments)   "makes me bleed"   Diovan [valsartan]    angioedema      Medication List       Accurate as of January 27, 2020  1:46 PM. If you have any questions, ask your nurse or doctor.        Albuterol Sulfate 108 (90 Base) MCG/ACT Aepb Commonly known as: PROAIR RESPICLICK Inhale 2 puffs into the lungs 4 (four) times daily.   amLODipine 10 MG tablet Commonly known as: NORVASC Take 1 tablet by mouth once daily   aspirin 81 MG chewable tablet Chew 1 tablet (81 mg total) by mouth daily.   cyclobenzaprine 10 MG tablet Commonly known as: FLEXERIL Take 1 tablet (10 mg total) by mouth 2 (two) times daily as needed for muscle spasms.   Dexilant 60 MG capsule Generic drug: dexlansoprazole Take 1 capsule (60 mg total) by mouth daily. LOT: [id number], EXP: 07-2021   FDGARD PO Take by mouth. Take as directed   fluticasone 50 MCG/ACT nasal spray Commonly known as: FLONASE Place 2 sprays into both nostrils daily.   methimazole 5 MG tablet Commonly known as: TAPAZOLE Take 1 tablet  by mouth once daily   metoprolol succinate 25 MG 24 hr tablet Commonly known as: Toprol XL Take 1 tablet (25 mg total) by mouth daily.   nystatin powder Commonly known as: MYCOSTATIN/NYSTOP Apply 1 application topically 2 (two) times daily. Beneath both breasts   ondansetron 4 MG tablet Commonly known as: ZOFRAN Take 1 tab by mouth every 6 hours as needed for nausea.   polyethylene glycol 17 g packet Commonly known as: MIRALAX / GLYCOLAX Take 17 g by mouth 2 (two) times daily.   potassium chloride SA 20 MEQ tablet Commonly known as: KLOR-CON Take 1 tablet (20 mEq total) by mouth daily.   Praluent 150 MG/ML Soaj Generic drug: Alirocumab Inject 1 pen into the skin every 14 (fourteen) days.   SUMAtriptan 50 MG tablet Commonly known as: IMITREX Take 1 tablet (50 mg total) by mouth every 2 (two) hours as needed for migraine. May repeat in 2 hours if headache persists or recurs.       Allergies:  Allergies  Allergen Reactions  . Ace Inhibitors     REACTION: chronic cough  . Celebrex [Celecoxib] Other (See Comments)    "makes me bleed"  . Diovan [Valsartan]     angioedema    Past Medical History, Surgical history, Social history, and Family History were reviewed and updated.  Review of Systems: All other 10 point  review of systems is negative.   Physical Exam:  weight is 225 lb 2 oz (102.1 kg). Her oral temperature is 98.8 F (37.1 C). Her blood pressure is 152/72 (abnormal) and her pulse is 56 (abnormal). Her respiration is 18 and oxygen saturation is 100%.   Wt Readings from Last 3 Encounters:  01/27/20 225 lb 2 oz (102.1 kg)  10/26/19 224 lb (101.6 kg)  10/08/19 222 lb 9.6 oz (101 kg)    Ocular: Sclerae unicteric, pupils equal, round and reactive to light Ear-nose-throat: Oropharynx clear, dentition fair Lymphatic: No cervical or supraclavicular adenopathy Lungs no rales or rhonchi, good excursion bilaterally Heart regular rate and rhythm, no murmur  appreciated Abd soft, nontender, positive bowel sounds, no liver or spleen tip palpated on exam, no fluid wave  MSK no focal spinal tenderness, no joint edema Neuro: non-focal, well-oriented, appropriate affect Breasts: Deferred   Lab Results  Component Value Date   WBC 6.8 01/27/2020   HGB 12.5 01/27/2020   HCT 42.3 01/27/2020   MCV 76.8 (L) 01/27/2020   PLT 231 01/27/2020   Lab Results  Component Value Date   FERRITIN 1,420 (H) 07/28/2019   IRON 25 (L) 07/28/2019   TIBC 220 (L) 07/28/2019   UIBC 195 07/28/2019   IRONPCTSAT 11 (L) 07/28/2019   Lab Results  Component Value Date   RETICCTPCT 1.2 01/27/2020   RBC 5.48 (H) 01/27/2020   RETICCTABS 48.5 04/08/2015   No results found for: Nils Pyle [facility] Lab Results  Component Value Date   IGGSERUM 1,243 08/26/2019   IGA 305 05/04/2009   Lab Results  Component Value Date   TOTALPROTELP 7.2 05/04/2009   ALBUMINELP 49.1 (L) 05/04/2009   A1GS 8.3 (H) 05/04/2009   A2GS 14.1 (H) 05/04/2009   BETS 6.6 05/04/2009   GAMS 16.4 05/04/2009   MSPIKE NOT DET 05/04/2009     Chemistry      Component Value Date/Time   NA 138 07/28/2019 0947   NA 143 02/12/2019 1229   NA 139 12/14/2016 0849   K 4.1 07/28/2019 0947   K 3.7 12/14/2016 0849   CL 102 07/28/2019 0947   CL 99 08/16/2015 1055   CO2 28 07/28/2019 0947   CO2 25 12/14/2016 0849   BUN 13 07/28/2019 0947   BUN 10 02/12/2019 1229   BUN 11.4 12/14/2016 0849   CREATININE 0.75 07/28/2019 0947   CREATININE 0.85 11/21/2018 0842   CREATININE 0.9 12/14/2016 0849      Component Value Date/Time   CALCIUM 9.1 07/28/2019 0947   CALCIUM 9.6 12/14/2016 0849   ALKPHOS 109 01/13/2020 1000   ALKPHOS 135 12/14/2016 0849   AST 15 01/13/2020 1000   AST 17 11/21/2018 0842   AST 22 12/14/2016 0849   ALT 20 01/13/2020 1000   ALT 20 11/21/2018 0842   ALT 32 12/14/2016 0849   BILITOT 0.3 01/13/2020 1000   BILITOT 0.3 07/21/2019 0756   BILITOT 0.4 11/21/2018 0842    BILITOT 0.29 12/14/2016 0849       Impression and Plan: [name] is a very pleasant 59yo African American female withhistory ofiron deficiency anemia.  We will see what her iron studies look like and replace if needed.  We will plan to see her again in 6 months.  She can contact our office with any questions or concerns.   Laverna Peace, NP 8/4/20211:46 PM

## 2020-01-28 ENCOUNTER — Telehealth: Payer: Self-pay | Admitting: Family

## 2020-01-28 LAB — IRON AND TIBC
Iron: 69 ug/dL (ref 41–142)
Saturation Ratios: 25 % (ref 21–57)
TIBC: 279 ug/dL (ref 236–444)
UIBC: 210 ug/dL (ref 120–384)

## 2020-01-28 LAB — FERRITIN: Ferritin: 1462 ng/mL — ABNORMAL HIGH (ref 11–307)

## 2020-01-28 LAB — ERYTHROPOIETIN: Erythropoietin: 2.9 m[IU]/mL (ref 2.6–18.5)

## 2020-01-28 NOTE — Telephone Encounter (Signed)
No los 8/4 

## 2020-02-01 ENCOUNTER — Other Ambulatory Visit: Payer: Self-pay | Admitting: Family

## 2020-02-08 ENCOUNTER — Encounter: Payer: Self-pay | Admitting: Family

## 2020-02-08 ENCOUNTER — Ambulatory Visit (INDEPENDENT_AMBULATORY_CARE_PROVIDER_SITE_OTHER): Payer: Medicare Other | Admitting: Family

## 2020-02-08 ENCOUNTER — Other Ambulatory Visit: Payer: Self-pay

## 2020-02-08 VITALS — BP 143/77 | HR 58 | Temp 98.4°F | Resp 16 | Wt 226.0 lb

## 2020-02-08 DIAGNOSIS — G8929 Other chronic pain: Secondary | ICD-10-CM

## 2020-02-08 DIAGNOSIS — R7303 Prediabetes: Secondary | ICD-10-CM

## 2020-02-08 DIAGNOSIS — E0590 Thyrotoxicosis, unspecified without thyrotoxic crisis or storm: Secondary | ICD-10-CM

## 2020-02-08 DIAGNOSIS — E1159 Type 2 diabetes mellitus with other circulatory complications: Secondary | ICD-10-CM | POA: Diagnosis not present

## 2020-02-08 DIAGNOSIS — D649 Anemia, unspecified: Secondary | ICD-10-CM

## 2020-02-08 DIAGNOSIS — M545 Low back pain, unspecified: Secondary | ICD-10-CM

## 2020-02-08 DIAGNOSIS — G43809 Other migraine, not intractable, without status migrainosus: Secondary | ICD-10-CM | POA: Diagnosis not present

## 2020-02-08 DIAGNOSIS — I10 Essential (primary) hypertension: Secondary | ICD-10-CM | POA: Diagnosis not present

## 2020-02-08 DIAGNOSIS — I152 Hypertension secondary to endocrine disorders: Secondary | ICD-10-CM

## 2020-02-08 LAB — BASIC METABOLIC PANEL
BUN: 17 mg/dL (ref 6–23)
CO2: 31 mEq/L (ref 19–32)
Calcium: 9.7 mg/dL (ref 8.4–10.5)
Chloride: 104 mEq/L (ref 96–112)
Creatinine, Ser: 0.91 mg/dL (ref 0.40–1.20)
GFR: 75.94 mL/min (ref >60.00)
Glucose, Bld: 99 mg/dL (ref 70–99)
Potassium: 4.5 mEq/L (ref 3.5–5.1)
Sodium: 143 mEq/L (ref 135–145)

## 2020-02-08 LAB — HEMOGLOBIN A1C: Hgb A1c MFr Bld: 6.4 % (ref 4.6–6.5)

## 2020-02-08 LAB — MICROALBUMIN / CREATININE URINE RATIO
Creatinine,U: 140.5 mg/dL
Microalb Creat Ratio: 0.5 mg/g (ref 0.0–30.0)
Microalb, Ur: <0.7 mg/dL (ref 0.0–1.9)

## 2020-02-08 MED ORDER — CYCLOBENZAPRINE HCL 10 MG PO TABS: 10.0000 mg | ORAL_TABLET | Freq: Two times a day (BID) | ORAL | 0 refills | Status: DC | PRN

## 2020-02-08 NOTE — Progress Notes (Signed)
Subjective:    Patient ID: [name], female    DOB: 1960/11/08, 59 y.o.   MRN: [id number]  HPI  Patient is a 59 yr old female who presents today for follow up.  HTN- maintained on amlodipine and metoprolol.  BP Readings from Last 3 Encounters:  02/08/20 (!) 143/77  01/27/20 (!) 152/72  10/26/19 (!) 144/84   Anemia- following with hematology for intermittent iron infusions.  Lab Results  Component Value Date   WBC 6.8 01/27/2020   HGB 12.5 01/27/2020   HCT 42.3 01/27/2020   MCV 76.8 (L) 01/27/2020   PLT 231 01/27/2020    Migraine- uses imitrex prn. Reports rare use of imitrex.    Hyperthyroid- following with Dr. Loanne Drilling.   Reports chronic low back pain. She has done pt and is already established with neurosurgery.   Completed covid vaccine.   Lab Results  Component Value Date   HGBA1C 6.9 (H) 07/28/2019     Review of Systems See HPI  Past Medical History:  Diagnosis Date  . Allergy    allergic rhinitis  . Arthritis   . Atypical chest pain   . Constipation   . Cyst, ovarian   . Diabetes mellitus, type II ([place])   . Endometriosis   . Esophageal stricture   . Gastroparesis   . GERD (gastroesophageal reflux disease)   . Heart murmur   . Hyperlipidemia   . Hypertension   . 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 Iron deficiency anemia   . Migraine headache   . Non-compliance   . Peptic ulcer disease   . Thyroid disease      Social History   Socioeconomic History  . Marital status: Widowed    Spouse name: Not on file  . Number of children: 2  . Years of education: Not on file  . Highest education level: Not on file  Occupational History  . Occupation: DISABILITY    Employer: UNEMPLOYED  Tobacco Use  . Smoking status: Former Smoker    Packs/day: 0.50    Years: 18.00    Pack years: 9.00    Types: Cigarettes    Start date: 07/29/1977    Quit date: 06/25/1994    Years since  quitting: 25.6  . Smokeless tobacco: Never Used  . Tobacco comment: quit 19 years ago  Vaping Use  . Vaping Use: Never used  Substance and Sexual Activity  . Alcohol use: Yes    Alcohol/week: 0.0 standard drinks    Comment: social drinker  . Drug use: No  . Sexual activity: Not Currently  Other Topics Concern  . Not on file  Social History Narrative   Widowed   Has 2 grown children.  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   Former Smoker Quit tobacco in 1996.  She was a pack a day smoker for approximately 10 years.   Alcohol use-yes: Social    Daily Caffeine Use:6 pack of pepsi daily     Illicit Drug Use - no    Patient does not get regular exercise.       Smoking Status:  quit   Social Determinants of Health   Financial Resource Strain:   . Difficulty of Paying Living Expenses:   Food Insecurity:   . Worried About Charity fundraiser in the Last Year:   . [place] in the Last Year:  Transportation Needs:   . Film/video editor (Medical):   Marland Kitchen Lack of Transportation (Non-Medical):   Physical Activity:   . Days of Exercise per Week:   . Minutes of Exercise per Session:   Stress:   . Feeling of Stress :   Social Connections:   . Frequency of Communication with Friends and Family:   . Frequency of Social Gatherings with Friends and Family:   . Attends Religious Services:   . Active Member of Clubs or Organizations:   . Attends Archivist Meetings:   Marland Kitchen Marital Status:   Intimate Partner Violence:   . Fear of Current or Ex-Partner:   . Emotionally Abused:   Marland Kitchen Physically Abused:   . Sexually Abused:     Past Surgical History:  Procedure Laterality Date  . ABDOMINAL EXPLORATION SURGERY     w/bso   . BREAST BIOPSY    . CARDIAC CATHETERIZATION  2009   mild non obstructive CAD  . CHOLECYSTECTOMY    . COLONOSCOPY    . KNEE SURGERY  2005    left knee  . LEFT HEART CATH AND CORONARY ANGIOGRAPHY N/A  07/17/2017   Procedure: LEFT HEART CATH AND CORONARY ANGIOGRAPHY;  Surgeon: Martinique, Peter M, MD;  Location: [facility];  Service: Cardiovascular;  Laterality: N/A;  . POLYPECTOMY    . TOTAL ABDOMINAL HYSTERECTOMY    . UPPER GASTROINTESTINAL ENDOSCOPY      Family History  Problem Relation Age of Onset  . Hypertension Mother   . Rheum arthritis Mother   . Hypertension Father   . Diabetes Father   . Prostate cancer Father   . Kidney disease Father   . Diabetes Sister   . Fibromyalgia Sister   . Diabetes Maternal Grandmother   . Lung cancer Maternal Grandfather   . Arthritis Brother   . Migraines Brother   . CAD Brother   . Fibromyalgia Sister   . Migraines Sister   . Colon cancer Neg Hx   . Thyroid disease Neg Hx   . Colon polyps Neg Hx     Allergies  Allergen Reactions  . Ace Inhibitors     REACTION: chronic cough  . Celebrex [Celecoxib] Other (See Comments)    "makes me bleed"  . Diovan [Valsartan]     angioedema    Current Outpatient Medications on File Prior to Visit  Medication Sig Dispense Refill  . Albuterol Sulfate 108 (90 Base) MCG/ACT AEPB Inhale 2 puffs into the lungs 4 (four) times daily. 1 each 3  . Alirocumab (PRALUENT) 150 MG/ML SOAJ Inject 1 pen into the skin every 14 (fourteen) days. 2 pen 11  . amLODipine (NORVASC) 10 MG tablet Take 1 tablet by mouth once daily 90 tablet 0  . aspirin 81 MG chewable tablet Chew 1 tablet (81 mg total) by mouth daily.    Gladis Riffle Oil-Levomenthol (FDGARD PO) Take by mouth. Take as directed    . cyclobenzaprine (FLEXERIL) 10 MG tablet Take 1 tablet (10 mg total) by mouth 2 (two) times daily as needed for muscle spasms. 20 tablet 0  . dexlansoprazole (DEXILANT) 60 MG capsule Take 1 capsule (60 mg total) by mouth daily. LOT: [id number], EXP: 07-2021 10 capsule 0  . fluticasone (FLONASE) 50 MCG/ACT nasal spray Place 2 sprays into both nostrils daily. 16 g 1  . methimazole (TAPAZOLE) 5 MG tablet Take 1 tablet by mouth once  daily 90 tablet 1  . metoprolol succinate (TOPROL XL) 25 MG 24  hr tablet Take 1 tablet (25 mg total) by mouth daily. 90 tablet 3  . nystatin (MYCOSTATIN/NYSTOP) powder Apply 1 application topically 2 (two) times daily. Beneath both breasts 60 g 1  . ondansetron (ZOFRAN) 4 MG tablet Take 1 tab by mouth every 6 hours as needed for nausea. 100 tablet 1  . polyethylene glycol (MIRALAX / GLYCOLAX) 17 g packet Take 17 g by mouth 2 (two) times daily. 14 each 0  . potassium chloride SA (K-DUR) 20 MEQ tablet Take 1 tablet (20 mEq total) by mouth daily. 30 tablet 11  . SUMAtriptan (IMITREX) 50 MG tablet Take 1 tablet (50 mg total) by mouth every 2 (two) hours as needed for migraine. May repeat in 2 hours if headache persists or recurs. 10 tablet 5   No current facility-administered medications on file prior to visit.    BP (!) 143/77 (BP Location: Right Arm, Patient Position: Sitting, Cuff Size: Small)   Pulse (!) 58   Temp 98.4 F (36.9 C) (Oral)   Resp 16   Wt 226 lb (102.5 kg)   SpO2 100%   BMI 37.61 kg/m       Objective:   Physical Exam Constitutional:      Appearance: She is well-developed.  Cardiovascular:     Rate and Rhythm: Normal rate and regular rhythm.     Heart sounds: Normal heart sounds. No murmur heard.   Pulmonary:     Effort: Pulmonary effort is normal. No respiratory distress.     Breath sounds: Normal breath sounds. No wheezing.  Psychiatric:        Behavior: Behavior normal.        Thought Content: Thought content normal.        Judgment: Judgment normal.           Assessment & Plan:  HTN- bp is acceptable. Continue current meds.  Anemia- stable, management per hematology.  Migraines- well controlled. Continue prn imitrex.    Hyperthyroid- clinically stable on tapazole. Management per endocrinology.  DM2- diet controlled.  Obtain follow up A1C.  Chronic low back pain- unchanged. Continue prn flexeril. Advised pt to schedule follow up with  neurosurgery.   This visit occurred during the SARS-CoV-2 public health emergency.  Safety protocols were in place, including screening questions prior to the visit, additional usage of staff PPE, and extensive cleaning of exam room while observing appropriate contact time as indicated for disinfecting solutions.

## 2020-02-09 ENCOUNTER — Encounter: Payer: Self-pay | Admitting: Family

## 2020-02-11 NOTE — Progress Notes (Signed)
Mailed out to pt 

## 2020-02-15 ENCOUNTER — Other Ambulatory Visit: Payer: Self-pay | Admitting: Internal Medicine

## 2020-03-07 ENCOUNTER — Other Ambulatory Visit: Payer: Self-pay | Admitting: Family

## 2020-03-07 ENCOUNTER — Other Ambulatory Visit: Payer: Self-pay | Admitting: Internal Medicine

## 2020-03-14 ENCOUNTER — Ambulatory Visit: Payer: Medicare Other | Admitting: Internal Medicine

## 2020-03-17 NOTE — Progress Notes (Signed)
Cardiology Office Note   Date:  03/19/2020   ID:  [name], DOB 03-05-1961, MRN [id number]  PCP:  [name], NP  Cardiologist:   [name], MD   Pt presents for f/u of CAD    History of Present Illness: [name] is a 59 y.o. female with a hx of CP and CAD   She wa admitted in Jan17with CP and vomiting that occurred after outpt EGD/dilitation Troponin was elevated  She went on to cardiac cath which showed 75% mid PDA (small vessel) Prox RCA with 25% OM 50% LVEF normal Focal wall motion abnormality ? Effect of spasm   Since I saw her in clinic last she says she notes occasional chest tightness with walking   She also says she feels tired.   Denies Dizziness  Breathing is stable     She says Praluent is more expensive with current insurance    Current Meds  Medication Sig  . Albuterol Sulfate 108 (90 Base) MCG/ACT AEPB Inhale 2 puffs into the lungs 4 (four) times daily.  Marland Kitchen amLODipine (NORVASC) 10 MG tablet Take 1 tablet by mouth once daily  . aspirin 81 MG chewable tablet Chew 1 tablet (81 mg total) by mouth daily.  Gladis Riffle Oil-Levomenthol (FDGARD PO) Take by mouth. Take as directed  . cyclobenzaprine (FLEXERIL) 10 MG tablet Take 1 tablet (10 mg total) by mouth 2 (two) times daily as needed for muscle spasms.  Marland Kitchen dexlansoprazole (DEXILANT) 60 MG capsule Take 1 capsule (60 mg total) by mouth daily. LOT: [id number], EXP: 07-2021  . fluticasone (FLONASE) 50 MCG/ACT nasal spray Place 2 sprays into both nostrils daily.  . methimazole (TAPAZOLE) 5 MG tablet Take 1 tablet by mouth once daily  . nystatin (MYCOSTATIN/NYSTOP) powder Apply 1 application topically 2 (two) times daily. Beneath both breasts  . ondansetron (ZOFRAN) 4 MG tablet Take 1 tab by mouth every 6 hours as needed for nausea.  . polyethylene glycol (MIRALAX / GLYCOLAX) 17 g packet Take 17 g by mouth 2 (two) times daily.  . potassium chloride SA (KLOR-CON) 20 MEQ tablet Take 1  tablet by mouth once daily  . SUMAtriptan (IMITREX) 50 MG tablet Take 1 tablet (50 mg total) by mouth every 2 (two) hours as needed for migraine. May repeat in 2 hours if headache persists or recurs.  . [DISCONTINUED] Alirocumab (PRALUENT) 150 MG/ML SOAJ Inject 1 pen into the skin every 14 (fourteen) days.  . [DISCONTINUED] metoprolol succinate (TOPROL-XL) 25 MG 24 hr tablet Take 1 tablet by mouth once daily     Allergies:   Ace inhibitors, Celebrex [celecoxib], and Diovan [valsartan]   Past Medical History:  Diagnosis Date  . Allergy    allergic rhinitis  . Arthritis   . Atypical chest pain   . Constipation   . Cyst, ovarian   . Diabetes mellitus, type II ([place])   . Endometriosis   . Esophageal stricture   . Gastroparesis   . GERD (gastroesophageal reflux disease)   . Heart murmur   . Hyperlipidemia   . Hypertension   . 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 Iron deficiency anemia   . Migraine headache   . Non-compliance   . Peptic ulcer disease   . Thyroid disease     Past Surgical History:  Procedure Laterality Date  . ABDOMINAL EXPLORATION SURGERY     w/bso   . BREAST BIOPSY    .  CARDIAC CATHETERIZATION  2009   mild non obstructive CAD  . CHOLECYSTECTOMY    . COLONOSCOPY    . KNEE SURGERY  2005    left knee  . LEFT HEART CATH AND CORONARY ANGIOGRAPHY N/A 07/17/2017   Procedure: LEFT HEART CATH AND CORONARY ANGIOGRAPHY;  Surgeon: Martinique, Peter M, MD;  Location: [facility];  Service: Cardiovascular;  Laterality: N/A;  . POLYPECTOMY    . TOTAL ABDOMINAL HYSTERECTOMY    . UPPER GASTROINTESTINAL ENDOSCOPY       Social History:  The patient  reports that she quit smoking about 25 years ago. Her smoking use included cigarettes. She started smoking about 42 years ago. She has a 9.00 pack-year smoking history. She has never used smokeless tobacco. She reports current alcohol use. She reports that she does not  use drugs.   Family History:  The patient's family history includes Arthritis in her brother; CAD in her brother; Diabetes in her father, maternal grandmother, and sister; Fibromyalgia in her sister and sister; Hypertension in her father and mother; Kidney disease in her father; Lung cancer in her maternal grandfather; Migraines in her brother and sister; Prostate cancer in her father; Rheum arthritis in her mother.    ROS:  Please see the history of present illness. All other systems are reviewed and  Negative to the above problem except as noted.    PHYSICAL EXAM: VS:  BP (!) 148/72   Pulse 72   Wt 226 lb 12.8 oz (102.9 kg)   SpO2 97%   BMI 37.74 kg/m   GEN: Morbidly obese 59 yo in no acute distress  HEENT: normal  Neck: no JVD Cardiac: RRR; no murmurs  No LE  edema  Respiratory:  clear to auscultation bilaterally GI: soft, nontender, nondistended, + BS  No hepatomegaly  MS: no deformity Moving all extremities   Skin: warm and dry, no rash Neuro:  Strength and sensation are intact Psych: euthymic mood, full affect   EKG:  EKG is ordered today.  SR 67 bpm   Occaional PVCs (mutifocal)   Lipid Panel    Component Value Date/Time   CHOL 207 (H) 03/18/2020 0922   TRIG 134 03/18/2020 0922   TRIG 103 04/30/2006 0949   HDL 50 03/18/2020 0922   CHOLHDL 4.1 03/18/2020 0922   CHOLHDL 6.7 07/17/2017 1507   VLDL 26 07/17/2017 1507   LDLCALC 133 (H) 03/18/2020 0922   LDLDIRECT 76 07/21/2019 0756   LDLDIRECT 131.0 10/31/2016 1555      Wt Readings from Last 3 Encounters:  03/18/20 226 lb 12.8 oz (102.9 kg)  02/08/20 226 lb (102.5 kg)  01/27/20 225 lb 2 oz (102.1 kg)      ASSESSMENT AND PLAN:  1  CAD   Pt with some chest tightess ? If angina ? spasm BP is up a little which ma exacerbate  WIll add imdur to regimen     2  HTN  BP is up   WIll add imdur and increase toprol to 50 mg dailly  3  Lipids   Continue meds   Plan to f/u in December 2021 Current medicines are  reviewed at length with the patient today.  The patient does not have concerns regarding medicines.  Signed, [name], MD  03/19/2020 5:39 PM    [address], [place], [place]  [address] Phone: [phone]; Fax: [phone]

## 2020-03-18 ENCOUNTER — Other Ambulatory Visit: Payer: Self-pay

## 2020-03-18 ENCOUNTER — Other Ambulatory Visit: Payer: Self-pay | Admitting: Pharmacist

## 2020-03-18 ENCOUNTER — Ambulatory Visit (INDEPENDENT_AMBULATORY_CARE_PROVIDER_SITE_OTHER): Payer: Medicare Other | Admitting: Internal Medicine

## 2020-03-18 ENCOUNTER — Encounter: Payer: Self-pay | Admitting: Internal Medicine

## 2020-03-18 VITALS — BP 148/72 | HR 72 | Wt 226.8 lb

## 2020-03-18 DIAGNOSIS — E785 Hyperlipidemia, unspecified: Secondary | ICD-10-CM

## 2020-03-18 DIAGNOSIS — I2510 Atherosclerotic heart disease of native coronary artery without angina pectoris: Secondary | ICD-10-CM | POA: Diagnosis not present

## 2020-03-18 LAB — LIPID PANEL
Chol/HDL Ratio: 4.1 ratio (ref 0.0–4.4)
Cholesterol, Total: 207 mg/dL — ABNORMAL HIGH (ref 100–199)
HDL: 50 mg/dL (ref >39)
LDL Chol Calc (NIH): 133 mg/dL — ABNORMAL HIGH (ref 0–99)
Triglycerides: 134 mg/dL (ref 0–149)
VLDL Cholesterol Cal: 24 mg/dL (ref 5–40)

## 2020-03-18 MED ORDER — PRALUENT 150 MG/ML ~~LOC~~ SOAJ: 1.0000 "pen " | SUBCUTANEOUS | 11 refills | Status: DC

## 2020-03-18 MED ORDER — ISOSORBIDE MONONITRATE ER 30 MG PO TB24
30.0000 mg | ORAL_TABLET | Freq: Every day | ORAL | 3 refills | Status: DC
Start: 2020-03-18 — End: 2020-05-27

## 2020-03-18 MED ORDER — METOPROLOL SUCCINATE ER 50 MG PO TB24
50.0000 mg | ORAL_TABLET | Freq: Every day | ORAL | 3 refills | Status: DC
Start: 2020-03-18 — End: 2022-01-12

## 2020-03-18 NOTE — Patient Instructions (Signed)
Medication Instructions:  1.  Increase Toprol XL to 50 mg daily 2  Add Imdur 30 mg daily   *If you need a refill on your cardiac medications before your next appointment, please call your pharmacy*   Lab Work: Lipid panel If you have labs (blood work) drawn today and your tests are completely normal, you will receive your results only by: Marland Kitchen MyChart Message (if you have MyChart) OR . A paper copy in the mail If you have any lab test that is abnormal or we need to change your treatment, we will call you to review the results.   Testing/Procedures: Lipid panel today    Follow-Up:  Will plan follow up in Dec 2021    At [facility], you and your health needs are our priority.  As part of our continuing mission to provide you with exceptional heart care, we have created designated Provider Care Teams.  These Care Teams include your primary Cardiologist (physician) and Advanced Practice Providers (APPs -  Physician Assistants and Nurse Practitioners) who all work together to provide you with the care you need, when you need it.  We recommend signing up for the patient portal called "MyChart".  Sign up information is provided on this After Visit Summary.  MyChart is used to connect with patients for Virtual Visits (Telemedicine).  Patients are able to view lab/test results, encounter notes, upcoming appointments, etc.  Non-urgent messages can be sent to your provider as well.   To learn more about what you can do with MyChart, go to [email or web address].     Provider:   You may see [name], MD or one of the following Advanced Practice Providers on your designated Care Team:    Richardson Dopp, PA-C  Robbie Lis, [place]    Other Instructions

## 2020-04-01 ENCOUNTER — Other Ambulatory Visit: Payer: Self-pay | Admitting: *Deleted

## 2020-04-01 MED ORDER — ROSUVASTATIN CALCIUM 20 MG PO TABS: 20.0000 mg | ORAL_TABLET | Freq: Every day | ORAL | 3 refills | Status: DC

## 2020-04-08 ENCOUNTER — Encounter: Payer: Self-pay | Admitting: Endocrinology

## 2020-04-08 ENCOUNTER — Other Ambulatory Visit: Payer: Self-pay | Admitting: Internal Medicine

## 2020-04-08 ENCOUNTER — Ambulatory Visit (INDEPENDENT_AMBULATORY_CARE_PROVIDER_SITE_OTHER): Payer: Medicare Other | Admitting: Endocrinology

## 2020-04-08 ENCOUNTER — Other Ambulatory Visit: Payer: Self-pay

## 2020-04-08 VITALS — BP 120/78 | HR 60 | Ht 65.0 in | Wt 228.0 lb

## 2020-04-08 DIAGNOSIS — E0590 Thyrotoxicosis, unspecified without thyrotoxic crisis or storm: Secondary | ICD-10-CM | POA: Diagnosis not present

## 2020-04-08 LAB — TSH: TSH: 2.16 u[IU]/mL (ref 0.35–4.50)

## 2020-04-08 LAB — T4, FREE: Free T4: 0.82 ng/dL (ref 0.60–1.60)

## 2020-04-08 NOTE — Progress Notes (Signed)
Subjective:    Patient ID: [name], female    DOB: 19-Sep-1960, 59 y.o.   MRN: [id number]  HPI Pt returns for f/u of hyperthyroidism (dx'ed 2012; she chose tapazole rx; she has never had thyroid imaging).  pt states she feels well in general.  Specifically, she denies palpitations and tremor.  Past Medical History:  Diagnosis Date  . Allergy    allergic rhinitis  . Arthritis   . Atypical chest pain   . Constipation   . Cyst, ovarian   . Diabetes mellitus, type II ([place])   . Endometriosis   . Esophageal stricture   . Gastroparesis   . GERD (gastroesophageal reflux disease)   . Heart murmur   . Hyperlipidemia   . Hypertension   . 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 Iron deficiency anemia   . Migraine headache   . Non-compliance   . Peptic ulcer disease   . Thyroid disease     Past Surgical History:  Procedure Laterality Date  . ABDOMINAL EXPLORATION SURGERY     w/bso   . BREAST BIOPSY    . CARDIAC CATHETERIZATION  2009   mild non obstructive CAD  . CHOLECYSTECTOMY    . COLONOSCOPY    . KNEE SURGERY  2005    left knee  . LEFT HEART CATH AND CORONARY ANGIOGRAPHY N/A 07/17/2017   Procedure: LEFT HEART CATH AND CORONARY ANGIOGRAPHY;  Surgeon: Martinique, Peter M, MD;  Location: [facility];  Service: Cardiovascular;  Laterality: N/A;  . POLYPECTOMY    . TOTAL ABDOMINAL HYSTERECTOMY    . UPPER GASTROINTESTINAL ENDOSCOPY      Social History   Socioeconomic History  . Marital status: Widowed    Spouse name: Not on file  . Number of children: 2  . Years of education: Not on file  . Highest education level: Not on file  Occupational History  . Occupation: DISABILITY    Employer: UNEMPLOYED  Tobacco Use  . Smoking status: Former Smoker    Packs/day: 0.50    Years: 18.00    Pack years: 9.00    Types: Cigarettes    Start date: 07/29/1977    Quit date: 06/25/1994    Years since quitting: 25.8   . Smokeless tobacco: Never Used  . Tobacco comment: quit 19 years ago  Vaping Use  . Vaping Use: Never used  Substance and Sexual Activity  . Alcohol use: Yes    Alcohol/week: 0.0 standard drinks    Comment: social drinker  . Drug use: No  . Sexual activity: Not Currently  Other Topics Concern  . Not on file  Social History Narrative   Widowed   Has 2 grown children.  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   Former Smoker Quit tobacco in 1996.  She was a pack a day smoker for approximately 10 years.   Alcohol use-yes: Social    Daily Caffeine Use:6 pack of pepsi daily     Illicit Drug Use - no    Patient does not get regular exercise.       Smoking Status:  quit   Social Determinants of Health   Financial Resource Strain:   . Difficulty of Paying Living Expenses: Not on file  Food Insecurity:   . Worried About Charity fundraiser in the Last Year: Not on file  . Ran Out of Food in the  Last Year: Not on file  Transportation Needs:   . Lack of Transportation (Medical): Not on file  . Lack of Transportation (Non-Medical): Not on file  Physical Activity:   . Days of Exercise per Week: Not on file  . Minutes of Exercise per Session: Not on file  Stress:   . Feeling of Stress : Not on file  Social Connections:   . Frequency of Communication with Friends and Family: Not on file  . Frequency of Social Gatherings with Friends and Family: Not on file  . Attends Religious Services: Not on file  . Active Member of Clubs or Organizations: Not on file  . Attends Archivist Meetings: Not on file  . Marital Status: Not on file  Intimate Partner Violence:   . Fear of Current or Ex-Partner: Not on file  . Emotionally Abused: Not on file  . Physically Abused: Not on file  . Sexually Abused: Not on file    Current Outpatient Medications on File Prior to Visit  Medication Sig Dispense Refill  . Albuterol Sulfate 108 (90 Base)  MCG/ACT AEPB Inhale 2 puffs into the lungs 4 (four) times daily. 1 each 3  . Alirocumab (PRALUENT) 150 MG/ML SOAJ Inject 1 pen into the skin every 14 (fourteen) days. 2 mL 11  . amLODipine (NORVASC) 10 MG tablet Take 1 tablet by mouth once daily 90 tablet 0  . aspirin 81 MG chewable tablet Chew 1 tablet (81 mg total) by mouth daily.    Gladis Riffle Oil-Levomenthol (FDGARD PO) Take by mouth. Take as directed    . cyclobenzaprine (FLEXERIL) 10 MG tablet Take 1 tablet (10 mg total) by mouth 2 (two) times daily as needed for muscle spasms. 20 tablet 0  . dexlansoprazole (DEXILANT) 60 MG capsule Take 1 capsule (60 mg total) by mouth daily. LOT: [id number], EXP: 07-2021 10 capsule 0  . fluticasone (FLONASE) 50 MCG/ACT nasal spray Place 2 sprays into both nostrils daily. 16 g 1  . isosorbide mononitrate (IMDUR) 30 MG 24 hr tablet Take 1 tablet (30 mg total) by mouth daily. 90 tablet 3  . methimazole (TAPAZOLE) 5 MG tablet Take 1 tablet by mouth once daily 90 tablet 1  . metoprolol succinate (TOPROL-XL) 50 MG 24 hr tablet Take 1 tablet (50 mg total) by mouth daily. Take with or immediately following a meal. 90 tablet 3  . nystatin (MYCOSTATIN/NYSTOP) powder Apply 1 application topically 2 (two) times daily. Beneath both breasts 60 g 1  . ondansetron (ZOFRAN) 4 MG tablet Take 1 tab by mouth every 6 hours as needed for nausea. 100 tablet 1  . polyethylene glycol (MIRALAX / GLYCOLAX) 17 g packet Take 17 g by mouth 2 (two) times daily. 14 each 0  . rosuvastatin (CRESTOR) 20 MG tablet Take 1 tablet (20 mg total) by mouth daily. 90 tablet 3  . SUMAtriptan (IMITREX) 50 MG tablet Take 1 tablet (50 mg total) by mouth every 2 (two) hours as needed for migraine. May repeat in 2 hours if headache persists or recurs. 10 tablet 5   No current facility-administered medications on file prior to visit.    Allergies  Allergen Reactions  . Ace Inhibitors     REACTION: chronic cough  . Celebrex [Celecoxib] Other (See  Comments)    "makes me bleed"  . Diovan [Valsartan]     angioedema    Family History  Problem Relation Age of Onset  . Hypertension Mother   . Rheum arthritis Mother   .  Hypertension Father   . Diabetes Father   . Prostate cancer Father   . Kidney disease Father   . Diabetes Sister   . Fibromyalgia Sister   . Diabetes Maternal Grandmother   . Lung cancer Maternal Grandfather   . Arthritis Brother   . Migraines Brother   . CAD Brother   . Fibromyalgia Sister   . Migraines Sister   . Colon cancer Neg Hx   . Thyroid disease Neg Hx   . Colon polyps Neg Hx     BP 120/78   Pulse 60   Ht 5\' 5"  (1.651 m)   Wt 228 lb (103.4 kg)   SpO2 97%   BMI 37.94 kg/m    Review of Systems Denies fever.      Objective:   Physical Exam VITAL SIGNS:  See vs page GENERAL: no distress NECK: There is no palpable thyroid enlargement.  No thyroid nodule is palpable.  No palpable lymphadenopathy at the anterior neck.   Lab Results  Component Value Date   TSH 2.16 04/08/2020   T4TOTAL 12.3 11/28/2011       Assessment & Plan:  Hyperthyroidism: well-controlled.  Please continue the same methimazole

## 2020-04-08 NOTE — Patient Instructions (Signed)
Blood tests are requested for you today.  We'll let you know about the results.  If ever you have fever while taking methimazole, stop it and call us, even if the reason is obvious, because of the risk of a rare side-effect. It is best to never miss the medication.  However, if you do miss it, next best is to double up the next time.   Please come back for a follow-up appointment in 6 months.   

## 2020-04-11 ENCOUNTER — Telehealth: Payer: Self-pay

## 2020-04-11 NOTE — Telephone Encounter (Signed)
Patient aware of results and recommendations. °

## 2020-04-11 NOTE — Telephone Encounter (Signed)
-----   Message from Renato Shin, MD sent at 04/09/2020  9:27 AM EDT ----- please contact patient: Normal.  Please continue the same medication.  I'll see you next time.

## 2020-04-27 ENCOUNTER — Telehealth: Payer: Self-pay | Admitting: Family

## 2020-04-27 DIAGNOSIS — D509 Iron deficiency anemia, unspecified: Secondary | ICD-10-CM

## 2020-04-27 NOTE — Telephone Encounter (Signed)
Orders placed. Please contact pt to schedule a lab appointment.

## 2020-04-27 NOTE — Telephone Encounter (Signed)
Pt is feeling like her iron may be low again. Does she need an OV or just a lab draw?

## 2020-04-28 NOTE — Telephone Encounter (Signed)
Patient advised orders are in for labs, she will call back to set up an appointment

## 2020-04-29 ENCOUNTER — Other Ambulatory Visit: Payer: Self-pay

## 2020-04-29 ENCOUNTER — Other Ambulatory Visit (INDEPENDENT_AMBULATORY_CARE_PROVIDER_SITE_OTHER): Payer: Medicare Other

## 2020-04-29 ENCOUNTER — Telehealth: Payer: Self-pay | Admitting: *Deleted

## 2020-04-29 DIAGNOSIS — D509 Iron deficiency anemia, unspecified: Secondary | ICD-10-CM | POA: Diagnosis not present

## 2020-04-29 NOTE — Telephone Encounter (Signed)
Pt came in for lab appt. She states she has been having cramping in her lower legs x 2 months. Has also been having back spasms.

## 2020-04-30 LAB — CBC WITH DIFFERENTIAL/PLATELET
Absolute Monocytes: 400 cells/uL (ref 200–950)
Basophils Absolute: 21 cells/uL (ref 0–200)
Basophils Relative: 0.3 %
Eosinophils Absolute: 62 cells/uL (ref 15–500)
Eosinophils Relative: 0.9 %
HCT: 38.3 % (ref 35.0–45.0)
Hemoglobin: 11.7 g/dL (ref 11.7–15.5)
Lymphs Abs: 2429 cells/uL (ref 850–3900)
MCH: 23 pg — ABNORMAL LOW (ref 27.0–33.0)
MCHC: 30.5 g/dL — ABNORMAL LOW (ref 32.0–36.0)
MCV: 75.4 fL — ABNORMAL LOW (ref 80.0–100.0)
MPV: 11.7 fL (ref 7.5–12.5)
Monocytes Relative: 5.8 %
Neutro Abs: 3988 cells/uL (ref 1500–7800)
Neutrophils Relative %: 57.8 %
Platelets: 212 10*3/uL (ref 140–400)
RBC: 5.08 10*6/uL (ref 3.80–5.10)
RDW: 13.6 % (ref 11.0–15.0)
Total Lymphocyte: 35.2 %
WBC: 6.9 10*3/uL (ref 3.8–10.8)

## 2020-04-30 LAB — IRON: Iron: 65 ug/dL (ref 45–160)

## 2020-04-30 LAB — FERRITIN: Ferritin: 790 ng/mL — ABNORMAL HIGH (ref 16–288)

## 2020-05-17 ENCOUNTER — Other Ambulatory Visit: Payer: Self-pay | Admitting: Internal Medicine

## 2020-05-21 ENCOUNTER — Other Ambulatory Visit: Payer: Self-pay | Admitting: Internal Medicine

## 2020-05-27 ENCOUNTER — Encounter: Payer: Self-pay | Admitting: Internal Medicine

## 2020-05-27 ENCOUNTER — Ambulatory Visit (INDEPENDENT_AMBULATORY_CARE_PROVIDER_SITE_OTHER): Payer: Medicare Other | Admitting: Internal Medicine

## 2020-05-27 ENCOUNTER — Other Ambulatory Visit: Payer: Self-pay

## 2020-05-27 VITALS — BP 138/72 | HR 60 | Ht 65.0 in | Wt 227.6 lb

## 2020-05-27 DIAGNOSIS — E785 Hyperlipidemia, unspecified: Secondary | ICD-10-CM

## 2020-05-27 DIAGNOSIS — R7989 Other specified abnormal findings of blood chemistry: Secondary | ICD-10-CM

## 2020-05-27 DIAGNOSIS — R945 Abnormal results of liver function studies: Secondary | ICD-10-CM

## 2020-05-27 LAB — LIPID PANEL
Chol/HDL Ratio: 2.1 ratio (ref 0.0–4.4)
Cholesterol, Total: 113 mg/dL (ref 100–199)
HDL: 54 mg/dL (ref >39)
LDL Chol Calc (NIH): 40 mg/dL (ref 0–99)
Triglycerides: 102 mg/dL (ref 0–149)
VLDL Cholesterol Cal: 19 mg/dL (ref 5–40)

## 2020-05-27 LAB — HEPATIC FUNCTION PANEL
ALT: 33 IU/L — ABNORMAL HIGH (ref 0–32)
AST: 18 IU/L (ref 0–40)
Albumin: 4.2 g/dL (ref 3.8–4.8)
Alkaline Phosphatase: 162 IU/L — ABNORMAL HIGH (ref 44–121)
Bilirubin Total: 0.2 mg/dL (ref 0.0–1.2)
Bilirubin, Direct: 0.11 mg/dL (ref 0.00–0.40)
Total Protein: 7 g/dL (ref 6.0–8.5)

## 2020-05-27 MED ORDER — PRALUENT 150 MG/ML ~~LOC~~ SOAJ: 1.0000 "pen " | SUBCUTANEOUS | 11 refills | Status: DC

## 2020-05-27 MED ORDER — ISOSORBIDE MONONITRATE ER 60 MG PO TB24
60.0000 mg | ORAL_TABLET | Freq: Every day | ORAL | 3 refills | Status: DC
Start: 2020-05-27 — End: 2021-05-16

## 2020-05-27 MED ORDER — NITROGLYCERIN 0.4 MG SL SUBL
0.4000 mg | SUBLINGUAL_TABLET | SUBLINGUAL | 3 refills | Status: DC | PRN
Start: 2020-05-27 — End: 2022-09-07

## 2020-05-27 NOTE — Progress Notes (Signed)
Cardiology Office Note   Date:  05/27/2020   ID:  [name], DOB 06-03-1961, MRN [id number]  PCP:  Denise Alar, NP  Cardiologist:   [name], MD   Pt presents for f/u of CAD    History of Present Illness: [name] is a 59 y.o. female with a hx of CP and CAD   She wa admitted in Jan2019with CP and vomiting that occurred after outpt EGD/dilitation Troponin was elevated  She went on to cardiac cath in 2019 which showed 75% mid PDA (small vessel) Prox RCA with 25% OM 50% LVEF normal Focal wall motion abnormality ? Effect of spasm   I saw hier in Sept 2021  At that time I added Imdur to regimen for BP and symptom control.    Since I saw her in September, she says she still gets intermitt tightness in chest with activity    She has cut back on sugar drinks    Still has some sweets   Has [employer] Shake in AM  Sometimes skips lunch  Dinner at 6  Overall about a 12 hour window but does have some sweets/snaks    Current Meds  Medication Sig  . Albuterol Sulfate 108 (90 Base) MCG/ACT AEPB Inhale 2 puffs into the lungs 4 (four) times daily.  . Alirocumab (PRALUENT) 150 MG/ML SOAJ Inject 1 pen into the skin every 14 (fourteen) days.  Marland Kitchen amLODipine (NORVASC) 10 MG tablet Take 1 tablet by mouth once daily  . aspirin 81 MG chewable tablet Chew 1 tablet (81 mg total) by mouth daily.  Gladis Riffle Oil-Levomenthol (FDGARD PO) Take by mouth. Take as directed  . cyclobenzaprine (FLEXERIL) 10 MG tablet Take 1 tablet (10 mg total) by mouth 2 (two) times daily as needed for muscle spasms.  Marland Kitchen dexlansoprazole (DEXILANT) 60 MG capsule Take 1 capsule (60 mg total) by mouth daily. LOT: [id number], EXP: 07-2021  . fluticasone (FLONASE) 50 MCG/ACT nasal spray Place 2 sprays into both nostrils daily.  . isosorbide mononitrate (IMDUR) 30 MG 24 hr tablet Take 1 tablet (30 mg total) by mouth daily.  . methimazole (TAPAZOLE) 5 MG tablet Take 1 tablet by mouth once daily  .  metoprolol succinate (TOPROL-XL) 50 MG 24 hr tablet Take 1 tablet (50 mg total) by mouth daily. Take with or immediately following a meal.  . nystatin (MYCOSTATIN/NYSTOP) powder Apply 1 application topically 2 (two) times daily. Beneath both breasts  . ondansetron (ZOFRAN) 4 MG tablet Take 1 tab by mouth every 6 hours as needed for nausea.  . polyethylene glycol (MIRALAX / GLYCOLAX) 17 g packet Take 17 g by mouth 2 (two) times daily.  . potassium chloride SA (KLOR-CON) 20 MEQ tablet TAKE 1  BY MOUTH ONCE DAILY  . rosuvastatin (CRESTOR) 20 MG tablet Take 1 tablet (20 mg total) by mouth daily.  . SUMAtriptan (IMITREX) 50 MG tablet Take 1 tablet (50 mg total) by mouth every 2 (two) hours as needed for migraine. May repeat in 2 hours if headache persists or recurs.     Allergies:   Ace inhibitors, Celebrex [celecoxib], and Diovan [valsartan]   Past Medical History:  Diagnosis Date  . Allergy    allergic rhinitis  . Arthritis   . Atypical chest pain   . Constipation   . Cyst, ovarian   . Diabetes mellitus, type II ([place])   . Endometriosis   . Esophageal stricture   . Gastroparesis   . GERD (gastroesophageal reflux disease)   .  Heart murmur   . Hyperlipidemia   . Hypertension   . 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 Iron deficiency anemia   . Migraine headache   . Non-compliance   . Peptic ulcer disease   . Thyroid disease     Past Surgical History:  Procedure Laterality Date  . ABDOMINAL EXPLORATION SURGERY     w/bso   . BREAST BIOPSY    . CARDIAC CATHETERIZATION  2009   mild non obstructive CAD  . CHOLECYSTECTOMY    . COLONOSCOPY    . KNEE SURGERY  2005    left knee  . LEFT HEART CATH AND CORONARY ANGIOGRAPHY N/A 07/17/2017   Procedure: LEFT HEART CATH AND CORONARY ANGIOGRAPHY;  Surgeon: Martinique, Peter M, MD;  Location: [facility];  Service: Cardiovascular;  Laterality: N/A;  . POLYPECTOMY    . TOTAL  ABDOMINAL HYSTERECTOMY    . UPPER GASTROINTESTINAL ENDOSCOPY       Social History:  The patient  reports that she quit smoking about 25 years ago. Her smoking use included cigarettes. She started smoking about 42 years ago. She has a 9.00 pack-year smoking history. She has never used smokeless tobacco. She reports current alcohol use. She reports that she does not use drugs.   Family History:  The patient's family history includes Arthritis in her brother; CAD in her brother; Diabetes in her father, maternal grandmother, and sister; Fibromyalgia in her sister and sister; Hypertension in her father and mother; Kidney disease in her father; Lung cancer in her maternal grandfather; Migraines in her brother and sister; Prostate cancer in her father; Rheum arthritis in her mother.    ROS:  Please see the history of present illness. All other systems are reviewed and  Negative to the above problem except as noted.    PHYSICAL EXAM: VS:  BP 138/72   Pulse 60   Ht 5\' 5"  (1.651 m)   Wt 227 lb 9.6 oz (103.2 kg)   SpO2 95%   BMI 37.87 kg/m   GEN: Morbidly obese 59 yo in no acute distress  HEENT: normal  Neck: no JVD Cardiac: RRR; no murmurs  No edema in legs   Respiratory:  clear to auscultation bilaterally GI: soft, nontender, nondistended, + BS  No hepatomegaly  MS: no deformity Moving all extremities   Skin: warm and dry, no rash Neuro:  Strength and sensation are intact Psych: euthymic mood, full affect   EKG:  EKG is not ordered    Lipid Panel    Component Value Date/Time   CHOL 207 (H) 03/18/2020 0922   TRIG 134 03/18/2020 0922   TRIG 103 04/30/2006 0949   HDL 50 03/18/2020 0922   CHOLHDL 4.1 03/18/2020 0922   CHOLHDL 6.7 07/17/2017 1507   VLDL 26 07/17/2017 1507   LDLCALC 133 (H) 03/18/2020 0922   LDLDIRECT 76 07/21/2019 0756   LDLDIRECT 131.0 10/31/2016 1555      Wt Readings from Last 3 Encounters:  05/27/20 227 lb 9.6 oz (103.2 kg)  04/08/20 228 lb (103.4 kg)    03/18/20 226 lb 12.8 oz (102.9 kg)      ASSESSMENT AND PLAN:  1  CAD Cath as noted above     IMdur added last visit   I would bump does to 60 mg   Follow     2  HTN  BP is better   Would bump Imdur a little more  Follow     3  Lipids   Continue meds Will check today   4  Obesity   Discussed diet  Limt snacks and in particular carbs/sweets   F/U in May   Current medicines are reviewed at length with the patient today.  The patient does not have concerns regarding medicines.  Signed, [name], MD  05/27/2020 10:11 AM    [facility] [address], [place], [place]  [address] Phone: [phone]; Fax: [phone]

## 2020-05-27 NOTE — Patient Instructions (Signed)
Medication Instructions:  Your physician has recommended you make the following change in your medication:  1.) increase isosorbide to 60 mg daily 2.) nitroglycerin tablets - place one tablet under your tongue for chest pain every 5 min as needed for up to 3 doses.  Please call [phone] if you need to take the 3rd dose.  *If you need a refill on your cardiac medications before your next appointment, please call your pharmacy*   Lab Work: Today: lipids/liver If you have labs (blood work) drawn today and your tests are completely normal, you will receive your results only by: Marland Kitchen MyChart Message (if you have MyChart) OR . A paper copy in the mail If you have any lab test that is abnormal or we need to change your treatment, we will call you to review the results.   Testing/Procedures: none   Follow-Up: At [facility], you and your health needs are our priority.  As part of our continuing mission to provide you with exceptional heart care, we have created designated Provider Care Teams.  These Care Teams include your primary Cardiologist (physician) and Advanced Practice Providers (APPs -  Physician Assistants and Nurse Practitioners) who all work together to provide you with the care you need, when you need it.  We recommend signing up for the patient portal called "MyChart".  Sign up information is provided on this After Visit Summary.  MyChart is used to connect with patients for Virtual Visits (Telemedicine).  Patients are able to view lab/test results, encounter notes, upcoming appointments, etc.  Non-urgent messages can be sent to your provider as well.   To learn more about what you can do with MyChart, go to [email or web address].    Your next appointment:   5 month(s)  The format for your next appointment:   In Person  Provider:   You may see [name], MD or one of the following Advanced Practice Providers on your designated Care Team:    Richardson Dopp, PA-C  Robbie Lis,  [place]    Other Instructions

## 2020-06-08 ENCOUNTER — Other Ambulatory Visit: Payer: Self-pay | Admitting: Internal Medicine

## 2020-06-08 ENCOUNTER — Other Ambulatory Visit: Payer: Self-pay | Admitting: Family

## 2020-06-10 ENCOUNTER — Encounter: Payer: Self-pay | Admitting: Family

## 2020-06-10 ENCOUNTER — Ambulatory Visit (INDEPENDENT_AMBULATORY_CARE_PROVIDER_SITE_OTHER): Payer: Medicare Other | Admitting: Family

## 2020-06-10 ENCOUNTER — Telehealth: Payer: Self-pay | Admitting: Family

## 2020-06-10 ENCOUNTER — Other Ambulatory Visit: Payer: Self-pay

## 2020-06-10 VITALS — BP 117/65 | HR 56 | Temp 98.4°F | Resp 16 | Ht 65.0 in | Wt 230.0 lb

## 2020-06-10 DIAGNOSIS — E0590 Thyrotoxicosis, unspecified without thyrotoxic crisis or storm: Secondary | ICD-10-CM

## 2020-06-10 DIAGNOSIS — I10 Essential (primary) hypertension: Secondary | ICD-10-CM | POA: Diagnosis not present

## 2020-06-10 DIAGNOSIS — Z23 Encounter for immunization: Secondary | ICD-10-CM | POA: Diagnosis not present

## 2020-06-10 DIAGNOSIS — R7303 Prediabetes: Secondary | ICD-10-CM | POA: Diagnosis not present

## 2020-06-10 DIAGNOSIS — G43809 Other migraine, not intractable, without status migrainosus: Secondary | ICD-10-CM | POA: Diagnosis not present

## 2020-06-10 LAB — COMPREHENSIVE METABOLIC PANEL
ALT: 29 U/L (ref 0–35)
AST: 16 U/L (ref 0–37)
Albumin: 4.1 g/dL (ref 3.5–5.2)
Alkaline Phosphatase: 140 U/L — ABNORMAL HIGH (ref 39–117)
BUN: 17 mg/dL (ref 6–23)
CO2: 29 mEq/L (ref 19–32)
Calcium: 9 mg/dL (ref 8.4–10.5)
Chloride: 105 mEq/L (ref 96–112)
Creatinine, Ser: 0.86 mg/dL (ref 0.40–1.20)
GFR: 72.75 mL/min (ref >60.00)
Glucose, Bld: 108 mg/dL — ABNORMAL HIGH (ref 70–99)
Potassium: 4.8 mEq/L (ref 3.5–5.1)
Sodium: 140 mEq/L (ref 135–145)
Total Bilirubin: 0.3 mg/dL (ref 0.2–1.2)
Total Protein: 7 g/dL (ref 6.0–8.3)

## 2020-06-10 LAB — HEMOGLOBIN A1C: Hgb A1c MFr Bld: 6.3 % (ref 4.6–6.5)

## 2020-06-10 MED ORDER — SUMATRIPTAN SUCCINATE 50 MG PO TABS: 50.0000 mg | ORAL_TABLET | ORAL | 5 refills | Status: DC | PRN

## 2020-06-10 MED ORDER — NYSTATIN 100000 UNIT/GM EX POWD: 1.0000 "application " | Freq: Two times a day (BID) | CUTANEOUS | 1 refills | Status: DC

## 2020-06-10 MED ORDER — ONDANSETRON HCL 4 MG PO TABS: ORAL_TABLET | ORAL | 1 refills | Status: DC

## 2020-06-10 NOTE — Progress Notes (Signed)
Subjective:    Patient ID: [name], female    DOB: Jan 19, 1961, 59 y.o.   MRN: [id number]  HPI  Patient is a 59 yr old female who presents today for follow up.  HTN- maintained on amlodipine 10mg , toprol xl 50mg  once daily.  Denies LE edema.  BP Readings from Last 3 Encounters:  06/10/20 117/65  05/27/20 138/72  04/08/20 120/78   Hyperlipidemia- on crestor 20mg .   Lab Results  Component Value Date   CHOL 113 05/27/2020   HDL 54 05/27/2020   LDLCALC 40 05/27/2020   LDLDIRECT 76 07/21/2019   TRIG 102 05/27/2020   CHOLHDL 2.1 05/27/2020   Hyperthyroid- on tapazole, following with endo.  Lab Results  Component Value Date   TSH 2.16 04/08/2020   She completed covid vaccine.  She will bring [place] a copy of her card.   Migraines- stable with prn imitrex 50mg .   Review of Systems See HPI  Past Medical History:  Diagnosis Date  . Allergy    allergic rhinitis  . Arthritis   . Atypical chest pain   . Constipation   . Cyst, ovarian   . Diabetes mellitus, type II ([place])   . Endometriosis   . Esophageal stricture   . Gastroparesis   . GERD (gastroesophageal reflux disease)   . Heart murmur   . Hyperlipidemia   . Hypertension   . 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 Iron deficiency anemia   . Migraine headache   . Non-compliance   . Peptic ulcer disease   . Thyroid disease      Social History   Socioeconomic History  . Marital status: Widowed    Spouse name: Not on file  . Number of children: 2  . Years of education: Not on file  . Highest education level: Not on file  Occupational History  . Occupation: DISABILITY    Employer: UNEMPLOYED  Tobacco Use  . Smoking status: Former Smoker    Packs/day: 0.50    Years: 18.00    Pack years: 9.00    Types: Cigarettes    Start date: 07/29/1977    Quit date: 06/25/1994    Years since quitting: 25.9  . Smokeless tobacco: Never Used  . Tobacco  comment: quit 19 years ago  Vaping Use  . Vaping Use: Never used  Substance and Sexual Activity  . Alcohol use: Yes    Alcohol/week: 0.0 standard drinks    Comment: social drinker  . Drug use: No  . Sexual activity: Not Currently  Other Topics Concern  . Not on file  Social History Narrative   Widowed   Has 2 grown children.  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   Former Smoker Quit tobacco in 1996.  She was a pack a day smoker for approximately 10 years.   Alcohol use-yes: Social    Daily Caffeine Use:6 pack of pepsi daily     Illicit Drug Use - no    Patient does not get regular exercise.       Smoking Status:  quit   Social Determinants of Health   Financial Resource Strain: Not on file  Food Insecurity: Not on file  Transportation Needs: Not on file  Physical Activity: Not on file  Stress: Not on file  Social Connections: Not on file  Intimate Partner Violence: Not on file    Past Surgical History:  Procedure Laterality Date  . ABDOMINAL EXPLORATION SURGERY     w/bso   . BREAST BIOPSY    . CARDIAC CATHETERIZATION  2009   mild non obstructive CAD  . CHOLECYSTECTOMY    . COLONOSCOPY    . KNEE SURGERY  2005    left knee  . LEFT HEART CATH AND CORONARY ANGIOGRAPHY N/A 07/17/2017   Procedure: LEFT HEART CATH AND CORONARY ANGIOGRAPHY;  Surgeon: Martinique, Peter M, MD;  Location: [facility];  Service: Cardiovascular;  Laterality: N/A;  . POLYPECTOMY    . TOTAL ABDOMINAL HYSTERECTOMY    . UPPER GASTROINTESTINAL ENDOSCOPY      Family History  Problem Relation Age of Onset  . Hypertension Mother   . Rheum arthritis Mother   . Hypertension Father   . Diabetes Father   . Prostate cancer Father   . Kidney disease Father   . Diabetes Sister   . Fibromyalgia Sister   . Diabetes Maternal Grandmother   . Lung cancer Maternal Grandfather   . Arthritis Brother   . Migraines Brother   . CAD Brother   . Fibromyalgia  Sister   . Migraines Sister   . Colon cancer Neg Hx   . Thyroid disease Neg Hx   . Colon polyps Neg Hx     Allergies  Allergen Reactions  . Ace Inhibitors     REACTION: chronic cough  . Celebrex [Celecoxib] Other (See Comments)    "makes me bleed"  . Diovan [Valsartan]     angioedema    Current Outpatient Medications on File Prior to Visit  Medication Sig Dispense Refill  . Albuterol Sulfate 108 (90 Base) MCG/ACT AEPB Inhale 2 puffs into the lungs 4 (four) times daily. 1 each 3  . Alirocumab (PRALUENT) 150 MG/ML SOAJ Inject 1 pen into the skin every 14 (fourteen) days. 2 mL 11  . amLODipine (NORVASC) 10 MG tablet Take 1 tablet by mouth once daily 90 tablet 0  . aspirin 81 MG chewable tablet Chew 1 tablet (81 mg total) by mouth daily.    Gladis Riffle Oil-Levomenthol (FDGARD PO) Take by mouth. Take as directed    . cyclobenzaprine (FLEXERIL) 10 MG tablet Take 1 tablet (10 mg total) by mouth 2 (two) times daily as needed for muscle spasms. 20 tablet 0  . dexlansoprazole (DEXILANT) 60 MG capsule Take 1 capsule (60 mg total) by mouth daily. LOT: [id number], EXP: 07-2021 10 capsule 0  . fluticasone (FLONASE) 50 MCG/ACT nasal spray Place 2 sprays into both nostrils daily. 16 g 1  . isosorbide mononitrate (IMDUR) 60 MG 24 hr tablet Take 1 tablet (60 mg total) by mouth daily. 90 tablet 3  . methimazole (TAPAZOLE) 5 MG tablet Take 1 tablet by mouth once daily 90 tablet 1  . metoprolol succinate (TOPROL-XL) 50 MG 24 hr tablet Take 1 tablet (50 mg total) by mouth daily. Take with or immediately following a meal. 90 tablet 3  . nitroGLYCERIN (NITROSTAT) 0.4 MG SL tablet Place 1 tablet (0.4 mg total) under the tongue every 5 (five) minutes as needed for chest pain. 25 tablet 3  . nystatin (MYCOSTATIN/NYSTOP) powder Apply 1 application topically 2 (two) times daily. Beneath both breasts 60 g 1  . ondansetron (ZOFRAN) 4 MG tablet Take 1 tab by mouth every 6 hours as needed for nausea. 100 tablet 1  .  polyethylene glycol (MIRALAX / GLYCOLAX) 17 g packet Take 17 g by mouth 2 (two) times daily. 14 each 0  .  potassium chloride SA (KLOR-CON) 20 MEQ tablet Take 1 tablet (20 mEq total) by mouth daily. 90 tablet 3  . rosuvastatin (CRESTOR) 20 MG tablet Take 1 tablet (20 mg total) by mouth daily. 90 tablet 3  . SUMAtriptan (IMITREX) 50 MG tablet Take 1 tablet (50 mg total) by mouth every 2 (two) hours as needed for migraine. May repeat in 2 hours if headache persists or recurs. 10 tablet 5   No current facility-administered medications on file prior to visit.    BP 117/65 (BP Location: Right Arm, Patient Position: Sitting, Cuff Size: Large)   Pulse (!) 56   Temp 98.4 F (36.9 C) (Oral)   Resp 16   Ht 5\' 5"  (1.651 m)   Wt 230 lb (104.3 kg)   SpO2 96%   BMI 38.27 kg/m       Objective:   Physical Exam Constitutional:      Appearance: She is well-developed and well-nourished.  Neck:     Thyroid: No thyromegaly.  Cardiovascular:     Rate and Rhythm: Normal rate and regular rhythm.     Heart sounds: Normal heart sounds. No murmur heard.   Pulmonary:     Effort: Pulmonary effort is normal. No respiratory distress.     Breath sounds: Normal breath sounds. No wheezing.  Musculoskeletal:     Cervical back: Neck supple.  Skin:    General: Skin is warm and dry.  Neurological:     Mental Status: She is alert and oriented to person, place, and time.  Psychiatric:        Mood and Affect: Mood and affect normal.        Behavior: Behavior normal.        Thought Content: Thought content normal.        Judgment: Judgment normal.           Assessment & Plan:  HTN- bp stable. Continue amlodipine 10mg , toprol xl. Denies LE edema.   Hyperlipidemia-Lipids stable. Continue crestor 20mg .    Hyperthyroid- TSH stable on tapazole. Management per endo.  Migraines- stable with prn imitrex 50mg  prn.    DM2- obtain A1c.  Clinically stable.   This visit occurred during the SARS-CoV-2 public  health emergency.  Safety protocols were in place, including screening questions prior to the visit, additional usage of staff PPE, and extensive cleaning of exam room while observing appropriate contact time as indicated for disinfecting solutions.

## 2020-06-10 NOTE — Telephone Encounter (Signed)
Pt dropped off copy of Covid Vaccination card to have on pt chart. Document put at front office tray under providers name.

## 2020-06-10 NOTE — Telephone Encounter (Signed)
Pts immunization record is updated.

## 2020-06-10 NOTE — Patient Instructions (Signed)
Please complete lab work prior to leaving.   

## 2020-06-12 ENCOUNTER — Encounter: Payer: Self-pay | Admitting: Family

## 2020-06-15 NOTE — Progress Notes (Signed)
Mailed out to pt 

## 2020-06-28 ENCOUNTER — Other Ambulatory Visit: Payer: Self-pay | Admitting: Endocrinology

## 2020-07-15 ENCOUNTER — Other Ambulatory Visit: Payer: Self-pay

## 2020-07-15 ENCOUNTER — Encounter (HOSPITAL_BASED_OUTPATIENT_CLINIC_OR_DEPARTMENT_OTHER): Payer: Self-pay | Admitting: *Deleted

## 2020-07-15 ENCOUNTER — Telehealth: Payer: Self-pay | Admitting: Family

## 2020-07-15 ENCOUNTER — Emergency Department (HOSPITAL_BASED_OUTPATIENT_CLINIC_OR_DEPARTMENT_OTHER)
Admission: EM | Admit: 2020-07-15 | Discharge: 2020-07-15 | Disposition: A | Discharge status: Home / Self Care | Payer: BC Managed Care – PPO | Attending: Emergency Medicine | Admitting: Emergency Medicine

## 2020-07-15 ENCOUNTER — Encounter: Payer: Self-pay | Admitting: Gastroenterology

## 2020-07-15 ENCOUNTER — Ambulatory Visit (INDEPENDENT_AMBULATORY_CARE_PROVIDER_SITE_OTHER): Payer: Medicare Other | Admitting: Gastroenterology

## 2020-07-15 VITALS — BP 140/82 | HR 67 | Ht 64.0 in | Wt 232.0 lb

## 2020-07-15 DIAGNOSIS — R059 Cough, unspecified: Secondary | ICD-10-CM | POA: Diagnosis not present

## 2020-07-15 DIAGNOSIS — Z20822 Contact with and (suspected) exposure to covid-19: Secondary | ICD-10-CM | POA: Diagnosis not present

## 2020-07-15 DIAGNOSIS — Z79899 Other long term (current) drug therapy: Secondary | ICD-10-CM | POA: Insufficient documentation

## 2020-07-15 DIAGNOSIS — Z87891 Personal history of nicotine dependence: Secondary | ICD-10-CM | POA: Diagnosis not present

## 2020-07-15 DIAGNOSIS — I10 Essential (primary) hypertension: Secondary | ICD-10-CM | POA: Insufficient documentation

## 2020-07-15 DIAGNOSIS — R748 Abnormal levels of other serum enzymes: Secondary | ICD-10-CM | POA: Diagnosis not present

## 2020-07-15 DIAGNOSIS — E119 Type 2 diabetes mellitus without complications: Secondary | ICD-10-CM | POA: Diagnosis not present

## 2020-07-15 DIAGNOSIS — K5909 Other constipation: Secondary | ICD-10-CM

## 2020-07-15 DIAGNOSIS — R1319 Other dysphagia: Secondary | ICD-10-CM

## 2020-07-15 DIAGNOSIS — Z7982 Long term (current) use of aspirin: Secondary | ICD-10-CM | POA: Insufficient documentation

## 2020-07-15 DIAGNOSIS — K760 Fatty (change of) liver, not elsewhere classified: Secondary | ICD-10-CM | POA: Diagnosis not present

## 2020-07-15 DIAGNOSIS — D1803 Hemangioma of intra-abdominal structures: Secondary | ICD-10-CM

## 2020-07-15 DIAGNOSIS — H9203 Otalgia, bilateral: Secondary | ICD-10-CM

## 2020-07-15 MED ORDER — LORATADINE 10 MG PO TABS: 10.0000 mg | ORAL_TABLET | Freq: Every day | ORAL | 0 refills | Status: AC

## 2020-07-15 MED ORDER — CIPROFLOXACIN-DEXAMETHASONE 0.3-0.1 % OT SUSP
4.0000 [drp] | Freq: Once | OTIC | Status: AC
  Administered 2020-07-15: 4 [drp] via OTIC
  Filled 2020-07-15: qty 7.5

## 2020-07-15 MED ORDER — BENZONATATE 100 MG PO CAPS: 100.0000 mg | ORAL_CAPSULE | Freq: Three times a day (TID) | ORAL | 0 refills | Status: DC

## 2020-07-15 NOTE — ED Provider Notes (Signed)
[facility] EMERGENCY DEPARTMENT Provider Note   CSN: [id number] Arrival date & time: 07/15/20  1513     History Chief Complaint  Patient presents with  . Ear Fullness    [name] is a 60 y.o. female.  Patient presents to the emergency department with bilateral ear pain over the past several days with associated sore throat.  No fevers, congestion.  She has had a cough starting at the same time.  She is vaccinated against COVID.  Patient has bad psoriasis and she has been applying Vaseline to the ear canals for some time.  No nausea, vomiting, or diarrhea.  She reports decreased hearing bilaterally as well.  No other treatments.        Past Medical History:  Diagnosis Date  . Allergy    allergic rhinitis  . Arthritis   . Atypical chest pain   . Constipation   . Cyst, ovarian   . Diabetes mellitus, type II ([place])   . Elevated alkaline phosphatase level   . Endometriosis   . Esophageal stricture   . Fatty liver   . Gastroparesis   . GERD (gastroesophageal reflux disease)   . Heart murmur   . Hepatic hemangioma   . Hyperlipidemia   . Hypertension   . 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 Iron deficiency anemia   . Migraine headache   . Non-compliance   . Peptic ulcer disease   . Thyroid disease     Patient Active Problem List   Diagnosis Date Noted  . Colitis 02/04/2018  . Aortic atherosclerosis ([place]) 07/17/2017  . Chest pain 07/16/2017  . Insomnia 01/12/2017  . Hyperglycemia 10/07/2014  . HTN (hypertension) 09/18/2012  . Breast cyst 06/09/2012  . Hyperthyroidism 02/29/2012  . Unspecified constipation 04/18/2011  . Can't get food down 04/18/2011  . Ulnar neuropathy 03/30/2011  . Low back pain 10/18/2010  . Fibromyalgia 07/20/2009  . Hyperlipidemia 05/26/2009  . Vitamin D deficiency 05/23/2009  . Iron deficiency anemia due to chronic blood loss 10/25/2008  . GERD 10/25/2008   . LIVER HEMANGIOMA 01/01/2008  . ESOPHAGEAL STRICTURE 01/01/2008  . Gastroparesis 01/01/2008  . ALLERGIC RHINITIS 12/08/2007  . Borderline type 2 diabetes mellitus 10/31/2007  . Migraine headache 08/11/2007  . HIATAL HERNIA 08/11/2007  . OVARIAN CYST 08/11/2007  . Essential hypertension, benign 05/06/2007    Past Surgical History:  Procedure Laterality Date  . ABDOMINAL EXPLORATION SURGERY     w/bso   . BREAST BIOPSY    . CARDIAC CATHETERIZATION  2009   mild non obstructive CAD  . CHOLECYSTECTOMY    . COLONOSCOPY    . KNEE SURGERY  2005    left knee  . LEFT HEART CATH AND CORONARY ANGIOGRAPHY N/A 07/17/2017   Procedure: LEFT HEART CATH AND CORONARY ANGIOGRAPHY;  Surgeon: Martinique, Peter M, MD;  Location: [facility];  Service: Cardiovascular;  Laterality: N/A;  . POLYPECTOMY    . TOTAL ABDOMINAL HYSTERECTOMY    . UPPER GASTROINTESTINAL ENDOSCOPY       OB History   No obstetric history on file.     Family History  Problem Relation Age of Onset  . Hypertension Mother   . Rheum arthritis Mother   . Hypertension Father   . Diabetes Father   . Prostate cancer Father   . Kidney disease Father   . Diabetes Sister   . Fibromyalgia Sister   . Diabetes Maternal Grandmother   .  Lung cancer Maternal Grandfather   . Arthritis Brother   . Migraines Brother   . CAD Brother   . Fibromyalgia Sister   . Migraines Sister   . Colon cancer Neg Hx   . Thyroid disease Neg Hx   . Colon polyps Neg Hx     Social History   Tobacco Use  . Smoking status: Former Smoker    Packs/day: 0.50    Years: 18.00    Pack years: 9.00    Types: Cigarettes    Start date: 07/29/1977    Quit date: 06/25/1994    Years since quitting: 26.0  . Smokeless tobacco: Never Used  . Tobacco comment: quit 19 years ago  Vaping Use  . Vaping Use: Never used  Substance Use Topics  . Alcohol use: Yes    Alcohol/week: 0.0 standard drinks    Comment: social drinker  . Drug use: No    Home  Medications Prior to Admission medications   Medication Sig Start Date End Date Taking? Authorizing Provider  Albuterol Sulfate 108 (90 Base) MCG/ACT AEPB Inhale 2 puffs into the lungs 4 (four) times daily. 09/01/18   [name], NP  Alirocumab (PRALUENT) 150 MG/ML SOAJ Inject 1 pen into the skin every 14 (fourteen) days. 05/27/20   Fay Records, MD  amLODipine (NORVASC) 10 MG tablet Take 1 tablet by mouth once daily 06/08/20   [name], NP  aspirin 81 MG chewable tablet Chew 1 tablet (81 mg total) by mouth daily. 07/19/17   Samuella Cota, MD  cyclobenzaprine (FLEXERIL) 10 MG tablet Take 1 tablet (10 mg total) by mouth 2 (two) times daily as needed for muscle spasms. 02/08/20   [name], NP  fluticasone (FLONASE) 50 MCG/ACT nasal spray Place 2 sprays into both nostrils daily. 07/10/19   Saguier, Percell Miller, PA-C  isosorbide mononitrate (IMDUR) 60 MG 24 hr tablet Take 1 tablet (60 mg total) by mouth daily. 05/27/20   Fay Records, MD  methimazole (TAPAZOLE) 5 MG tablet Take 1 tablet by mouth once daily 06/28/20   Renato Shin, MD  metoprolol succinate (TOPROL-XL) 50 MG 24 hr tablet Take 1 tablet (50 mg total) by mouth daily. Take with or immediately following a meal. 03/18/20   Fay Records, MD  nitroGLYCERIN (NITROSTAT) 0.4 MG SL tablet Place 1 tablet (0.4 mg total) under the tongue every 5 (five) minutes as needed for chest pain. 05/27/20   Fay Records, MD  nystatin (MYCOSTATIN/NYSTOP) powder Apply 1 application topically 2 (two) times daily. Beneath both breasts 06/10/20   [name], NP  ondansetron (ZOFRAN) 4 MG tablet Take 1 tab by mouth every 6 hours as needed for nausea. 06/10/20   [name], NP  potassium chloride SA (KLOR-CON) 20 MEQ tablet Take 1 tablet (20 mEq total) by mouth daily. 06/08/20   Fay Records, MD  rosuvastatin (CRESTOR) 20 MG tablet Take 1 tablet (20 mg total) by mouth daily. 04/01/20   Fay Records, MD  SUMAtriptan (IMITREX) 50  MG tablet Take 1 tablet (50 mg total) by mouth every 2 (two) hours as needed for migraine. May repeat in 2 hours if headache persists or recurs. 06/10/20   [name], NP    Allergies    Ace inhibitors, Celebrex [celecoxib], and Diovan [valsartan]  Review of Systems   Review of Systems  Constitutional: Negative for chills, fatigue and fever.  HENT: Positive for ear pain, hearing loss and sore throat. Negative for congestion, rhinorrhea and sinus  pressure.   Eyes: Negative for redness.  Respiratory: Positive for cough. Negative for shortness of breath and wheezing.   Gastrointestinal: Negative for abdominal pain, diarrhea, nausea and vomiting.  Genitourinary: Negative for dysuria.  Musculoskeletal: Negative for myalgias and neck stiffness.  Skin: Negative for rash.  Neurological: Negative for headaches.  Hematological: Negative for adenopathy.    Physical Exam Updated Vital Signs BP (!) 159/74   Pulse 70   Temp 98 F (36.7 C) (Oral)   Resp 18   Ht 5\' 4"  (1.626 m)   Wt 105.2 kg   SpO2 100%   BMI 39.81 kg/m   Physical Exam Vitals and nursing note reviewed.  Constitutional:      Appearance: She is well-developed and well-nourished.  HENT:     Head: Normocephalic and atraumatic.     Jaw: No trismus.     Right Ear: Tympanic membrane, ear canal and external ear normal.     Left Ear: Tympanic membrane, ear canal and external ear normal.     Ears:     Comments: Moderate amount of flaky debris in ear canal, consistent with Vaseline.  Ear canals are inflamed bilaterally with skin thickening due to psoriasis.  No TM erythema or bulging.    Nose: Nose normal. No mucosal edema or rhinorrhea.     Mouth/Throat:     Mouth: Oropharynx is clear and moist and mucous membranes are normal. Mucous membranes are not dry. No oral lesions.     Pharynx: Uvula midline. No oropharyngeal exudate, posterior oropharyngeal edema, posterior oropharyngeal erythema or uvula swelling.      Tonsils: No tonsillar abscesses.  Eyes:     General:        Right eye: No discharge.        Left eye: No discharge.     Conjunctiva/sclera: Conjunctivae normal.  Cardiovascular:     Rate and Rhythm: Normal rate and regular rhythm.     Heart sounds: Normal heart sounds.  Pulmonary:     Effort: Pulmonary effort is normal. No respiratory distress.     Breath sounds: Normal breath sounds. No wheezing or rales.  Abdominal:     Palpations: Abdomen is soft.     Tenderness: There is no abdominal tenderness.  Musculoskeletal:     Cervical back: Normal range of motion and neck supple.  Lymphadenopathy:     Cervical: No cervical adenopathy.  Skin:    General: Skin is warm and dry.  Neurological:     Mental Status: She is alert.  Psychiatric:        Mood and Affect: Mood and affect normal.     ED Results / Procedures / Treatments   Labs (all labs ordered are listed, but only abnormal results are displayed) Labs Reviewed  SARS CORONAVIRUS 2 (TAT 6-24 HRS)    EKG None  Radiology No results found.  Procedures Procedures (including critical care time)  Medications Ordered in ED Medications  ciprofloxacin-dexamethasone (CIPRODEX) 0.3-0.1 % OTIC (EAR) suspension 4 drop (has no administration in time range)    ED Course  I have reviewed the triage vital signs and the nursing notes.  Pertinent labs & imaging results that were available during my care of the patient were reviewed by me and considered in my medical decision making (see chart for details).  Patient seen and examined. Will ask for irrigation of ears to get a better exam.   Vital signs reviewed and are as follows: BP (!) 158/89 (BP Location: Right Arm)  Pulse 75   Temp 98 F (36.7 C) (Oral)   Resp 18   Ht 5\' 4"  (1.626 m)   Wt 105.2 kg   SpO2 100%   BMI 39.81 kg/m   Irrigation performed.  Patient has some residual debris.  Plan: Home with Ciprodex to use in the ears for inflammation, loratadine.  Will  prescribe Tessalon for cough and send COVID test.  Patient encouraged to follow-up with her doctor next week if symptoms are not improved.  [name] was evaluated in Emergency Department on 07/15/2020 for the symptoms described in the history of present illness. She was evaluated in the context of the global COVID-19 pandemic, which necessitated consideration that the patient might be at risk for infection with the SARS-CoV-2 virus that causes COVID-19. Institutional protocols and algorithms that pertain to the evaluation of patients at risk for COVID-19 are in a state of rapid change based on information released by regulatory bodies including the CDC and federal and state organizations. These policies and algorithms were followed during the patient's care in the ED.     MDM Rules/Calculators/A&P                          Patient with bilateral ear pain and decreased hearing.  I do not see any definitive signs of otitis media today.  Her ear canals are inflamed and partially blocked due to psoriasis and Vaseline that she has been applying.  Best attempt made to irrigate canals.  Patient prescribed Ciprodex eardrops, antihistamines.  Decreased hearing may be due to middle ear effusion/eustachian tube dysfunction.  TMs appear intact.  She also has a cough and sore throat concerning for viral illness, although mild.  COVID testing sent.  Final Clinical Impression(s) / ED Diagnoses Final diagnoses:  Ear pain, bilateral  Cough    Rx / DC Orders ED Discharge Orders         Ordered    benzonatate (TESSALON) 100 MG capsule  Every 8 hours        07/15/20 1714    loratadine (CLARITIN) 10 MG tablet  Daily        07/15/20 1714           Carlisle Cater, PA-C 07/15/20 1717    Lucrezia Starch, MD 07/18/20 1524

## 2020-07-15 NOTE — Discharge Instructions (Signed)
Please read and follow all provided instructions.  Your diagnoses today include:  1. Ear pain, bilateral   2. Cough     Tests performed today include:  Vital signs. See below for your results today.   COVID test - pending, results will be back tomorrow at the latest  Medications prescribed:   Ciprodex ear drops - instill 4 drops into each ear ear twice daily for 7 days   Tessalon Perles - cough suppressant medication   Loratadine -antihistamine which may help any fluid buildup in the ears   Take any prescribed medications only as directed.  Home care instructions:  Follow any educational materials contained in this packet.  BE VERY CAREFUL not to take multiple medicines containing Tylenol (also called acetaminophen). Doing so can lead to an overdose which can damage your liver and cause liver failure and possibly death.   Follow-up instructions: Please follow-up with your primary care provider in the next 3 days for further evaluation of your symptoms.   Return instructions:   Please return to the Emergency Department if you experience worsening symptoms.   Please return if you have any other emergent concerns.  Additional Information:  Your vital signs today were: BP (!) 158/89 (BP Location: Right Arm)   Pulse 75   Temp 98 F (36.7 C) (Oral)   Resp 18   Ht 5\' 4"  (1.626 m)   Wt 105.2 kg   SpO2 100%   BMI 39.81 kg/m  If your blood pressure (BP) was elevated above 135/85 this visit, please have this repeated by your doctor within one month. --------------

## 2020-07-15 NOTE — ED Notes (Signed)
Pt. Ears irrigated bilat.

## 2020-07-15 NOTE — Patient Instructions (Addendum)
If you are age 62 or older, your body mass index should be between 23-30. Your Body mass index is 39.82 kg/m. If this is out of the aforementioned range listed, please consider follow up with your Primary Care Provider.  If you are age 29 or younger, your body mass index should be between 19-25. Your Body mass index is 39.82 kg/m. If this is out of the aformentioned range listed, please consider follow up with your Primary Care Provider.   Due to recent COVID-19 restrictions implemented by our local and state authorities and in an effort to keep both patients and staff as safe as possible, our hospital system now requires COVID-19 testing prior to any scheduled hospital procedure. Please go to [facility], [place], [place] [address] on Thursday, 07-28-20 at  10:30am. This is a drive up testing site, you will not need to exit your vehicle.  You will not be billed at the time of testing but may receive a bill later depending on your insurance. The approximate cost of the test is $100. You must agree to quarantine from the time of your testing until the procedure date on 08-01-20 . This should include staying at home with ONLY the people you live with. Avoid take-out, grocery store shopping or leaving the house for any non-emergent reason. Failure to have your COVID-19 test done on the date and time you have been scheduled will result in cancellation of procedure. Please call our office at [phone] if you have any questions.    You have been scheduled for an esophageal manometry test at [facility] on Monday, 08-01-09 at 10:30am. Please arrive 30 minutes prior to your procedure for registration. You will need to go to outpatient registration (1st floor of the hospital) first. Make certain to bring your insurance cards as well as a complete list of medications.  Please remember the following:  1) Do not take any muscle relaxants, xanax (alprazolam) or ativan for 1 day prior to your test as well  as the day of the test.  2) Nothing to eat or drink after 12:00 midnight on the night before your test.  3) Hold all diabetic medications/insulin the morning of the test. You may eat and take your medications after the test.  It will take at least 2 weeks to receive the results of this test from your physician.  ------------------------------------------ ABOUT ESOPHAGEAL MANOMETRY Esophageal manometry (muh-NOM-uh-tree) is a test that gauges how well your esophagus works. Your esophagus is the long, muscular tube that connects your throat to your stomach. Esophageal manometry measures the rhythmic muscle contractions (peristalsis) that occur in your esophagus when you swallow. Esophageal manometry also measures the coordination and force exerted by the muscles of your esophagus.  During esophageal manometry, a thin, flexible tube (catheter) that contains sensors is passed through your nose, down your esophagus and into your stomach. Esophageal manometry can be helpful in diagnosing some mostly uncommon disorders that affect your esophagus.  Why it's done Esophageal manometry is used to evaluate the movement (motility) of food through the esophagus and into the stomach. The test measures how well the circular bands of muscle (sphincters) at the top and bottom of your esophagus open and close, as well as the pressure, strength and pattern of the wave of esophageal muscle contractions that moves food along.  What you can expect Esophageal manometry is an outpatient procedure done without sedation. Most people tolerate it well. You may be asked to change into a hospital  gown before the test starts.  During esophageal manometry  . While you are sitting up, a member of your health care team sprays your throat with a numbing medication or puts numbing gel in your nose or both.  . A catheter is guided through your nose into your esophagus. The catheter may be sheathed in a water-filled sleeve. It doesn't  interfere with your breathing. However, your eyes may water, and you may gag. You may have a slight nosebleed from irritation.  . After the catheter is in place, you may be asked to lie on your back on an exam table, or you may be asked to remain seated.  . You then swallow small sips of water. As you do, a computer connected to the catheter records the pressure, strength and pattern of your esophageal muscle contractions.  . During the test, you'll be asked to breathe slowly and smoothly, remain as still as possible, and swallow only when you're asked to do so.  . A member of your health care team may move the catheter down into your stomach while the catheter continues its measurements.  . The catheter then is slowly withdrawn. The test usually lasts 20 to 30 minutes.  After esophageal manometry  When your esophageal manometry is complete, you may return to your normal activities  This test typically takes 30-45 minutes to complete. _____________________________________________________________________________    [name] will be due for labs in June 2022. We will remind you when it is time to go to the lab.  Thank you for entrusting me with your care and for choosing [facility], Dr.  Cellar

## 2020-07-15 NOTE — Progress Notes (Signed)
HPI :  60 year old female here for a follow-up visit for dysphagia, elevated alkaline phosphatase, chronic constipation, fatty liver / hepatic hemangioma.  Her main complaint over the last few visits has been mainly dysphagia.  She has had intermittent dysphagia to both solids and liquids longstanding at this point time.  She can eat most of her meals and tolerate them but she has mild symptoms on a more so persistent basis.  She previously had some pyrosis that had been bothering her and I recommended Dexilant trial.  Does not sound like she ever took that and is not taking much of anything for reflux now and she states she does not need it.  She has not been having any reflux symptoms lately.  I have seen her in the past for dysphagia.  She had an EGD with me in January 2019, had a small hiatal hernia and a very mild benign stricture at the [facility].  She underwent balloon dilation to 20 mm but no mucosal rent was even noted.  She had no relief of dysphagia with that dilation.  Post procedure she had severe chest pain in fact was admitted to the hospital, had no damage to her esophagus but had a troponin elevation and cardiology thought she had a vasospastic cardiac event that led to her symptoms.  Since her last visit we performed a barium swallow in regards to these persistent symptoms and it showed mild circumferential thickening of the distal esophagus with delayed emptying however barium tablet passed through without delay.  She had wanted to hold off on further evaluation of this at that time and managing with eating smaller bites and eating slowly.  Her symptoms are about the same since of last seen her.  She does not really have any abdominal pains that bother her distantly.  She had been using MiraLAX for constipation previously and that had been working however she states over time it stopped working as well.  She is using supplement at [facility] called " med 7", she thinks it is an herbal laxative and it  works really well for her.  She is very happy with the regimen and wishes to continue that for now.  No blood in her stool or concerning symptoms that are new.  She has had an elevated alkaline phosphatase over the past few years.  GGT is elevated, alkaline phosphatase level has mostly been high 100s to lower 100s.  ALT AST have been normal.  Imaging of her abdomen over the years has shown a stable large hepatic and angioma upwards of 7 to 8 cm in size.  This really has not changed on the last 2 images with CT that of been obtained.  Her alkaline phosphatase level continues to fluctuate slightly, most recently 140s.  He had a serologic work-up otherwise that has been unremarkable for other causes of liver disease.  In regards to her early satiety this is been a chronic issue as well. She however is not complaining of this issue today.  She had a gastric emptying study in 2010 which was abnormal with Dr. Deatra Ina.  She was given Reglan which did not help, tried on erythromycin which did not help, and then tried on domperidone which did not help.  She states that none of these regimes provided any benefit for her.  A follow-up gastric emptying study in 2011 which was normal.  Prior work-up CT 08/12/19 -  IMPRESSION: Stable appearing hepatic hemangioma. 8.4 cm in size, mild fatty infiltration  Fatty liver.   Flex sig 04/10/2018 - Preparation of the colon was poor, inadquate for assessing for polyps. - Of the visualized mucosa in the left colon, it appeared normal without any inflammatory changes. Biopsied. Hervey Ard / angulated turn in the sigmoid colon Overall, ischemic colitis remains possible to account for prior symptoms although nothing overtly appreciated on today's exam.  EGD 1/22/20219 - Esophagogastric landmarks identified. - 2 cm hiatal hernia. - One suspected benign-appearing esophageal stenosis. Dilated to 83m without mucosal wrents. - Normal stomach. - A single suspected duodenal  polyp versus less likely ampulla. Biopsied.  Colonoscopy 07/16/2017 - The perianal and digital rectal examinations were normal. - The terminal ileum appeared normal. - The colon was tortuous. - A few medium-mouthed diverticula were found in the sigmoid colon. - Internal hemorrhoids were found during retroflexion. - The exam was otherwise without abnormality.  GES abnormal 03/07/09 - delayed GES normal 02/03/10  Barium swallow 09/04/19 - IMPRESSION: Mild circumferential narrowing of the distal esophagus resulting in delayed esophageal clearance. A 13 mm barium tablet passes without difficulty. Patient reported presence of typical symptoms during the Study.  Labs Feb - [facility] 2021 - hep panel negative - (+) ANA 1:320, IgG 1243, SMA negative - AMA negative  Repeated AP to 138 on 10/20/19, and then normalized on 01/13/20 to 109. AP again elevated to 162  Mild microcytic anemia but normal iron studies, in fact ferritin noted of 1462 - patient has been on / off iron supplements over the years, followed by hematology   Past Medical History:  Diagnosis Date  . Allergy    allergic rhinitis  . Arthritis   . Atypical chest pain   . Constipation   . Cyst, ovarian   . Diabetes mellitus, type II ([facility]   . Elevated alkaline phosphatase level   . Endometriosis   . Esophageal stricture   . Fatty liver   . Gastroparesis   . GERD (gastroesophageal reflux disease)   . Heart murmur   . Hepatic hemangioma   . Hyperlipidemia   . Hypertension   . 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 Iron deficiency anemia   . Migraine headache   . Non-compliance   . Peptic ulcer disease   . Thyroid disease      Past Surgical History:  Procedure Laterality Date  . ABDOMINAL EXPLORATION SURGERY     w/bso   . BREAST BIOPSY    . CARDIAC CATHETERIZATION  2009   mild non obstructive CAD  . CHOLECYSTECTOMY    . COLONOSCOPY    . KNEE SURGERY  2005     left knee  . LEFT HEART CATH AND CORONARY ANGIOGRAPHY N/A 07/17/2017   Procedure: LEFT HEART CATH AND CORONARY ANGIOGRAPHY;  Surgeon: JMartinique Peter M, MD;  [facility];  Service: Cardiovascular;  Laterality: N/A;  . POLYPECTOMY    . TOTAL ABDOMINAL HYSTERECTOMY    . UPPER GASTROINTESTINAL ENDOSCOPY     Family History  Problem Relation Age of Onset  . Hypertension Mother   . Rheum arthritis Mother   . Hypertension Father   . Diabetes Father   . Prostate cancer Father   . Kidney disease Father   . Diabetes Sister   . Fibromyalgia Sister   . Diabetes Maternal Grandmother   . Lung cancer Maternal Grandfather   . Arthritis Brother   . Migraines Brother   . CAD Brother   . Fibromyalgia Sister   .  Migraines Sister   . Colon cancer Neg Hx   . Thyroid disease Neg Hx   . Colon polyps Neg Hx    Social History   Tobacco Use  . Smoking status: Former Smoker    Packs/day: 0.50    Years: 18.00    Pack years: 9.00    Types: Cigarettes    Start date: 07/29/1977    Quit date: 06/25/1994    Years since quitting: 26.0  . Smokeless tobacco: Never Used  . Tobacco comment: quit 19 years ago  Vaping Use  . Vaping Use: Never used  Substance Use Topics  . Alcohol use: Yes    Alcohol/week: 0.0 standard drinks    Comment: social drinker  . Drug use: No   Current Outpatient Medications  Medication Sig Dispense Refill  . Albuterol Sulfate 108 (90 Base) MCG/ACT AEPB Inhale 2 puffs into the lungs 4 (four) times daily. 1 each 3  . Alirocumab (PRALUENT) 150 MG/ML SOAJ Inject 1 pen into the skin every 14 (fourteen) days. 2 mL 11  . amLODipine (NORVASC) 10 MG tablet Take 1 tablet by mouth once daily 90 tablet 0  . aspirin 81 MG chewable tablet Chew 1 tablet (81 mg total) by mouth daily.    . cyclobenzaprine (FLEXERIL) 10 MG tablet Take 1 tablet (10 mg total) by mouth 2 (two) times daily as needed for muscle spasms. 20 tablet 0  . fluticasone (FLONASE) 50 MCG/ACT nasal spray Place 2  sprays into both nostrils daily. 16 g 1  . isosorbide mononitrate (IMDUR) 60 MG 24 hr tablet Take 1 tablet (60 mg total) by mouth daily. 90 tablet 3  . methimazole (TAPAZOLE) 5 MG tablet Take 1 tablet by mouth once daily 90 tablet 3  . metoprolol succinate (TOPROL-XL) 50 MG 24 hr tablet Take 1 tablet (50 mg total) by mouth daily. Take with or immediately following a meal. 90 tablet 3  . nitroGLYCERIN (NITROSTAT) 0.4 MG SL tablet Place 1 tablet (0.4 mg total) under the tongue every 5 (five) minutes as needed for chest pain. 25 tablet 3  . nystatin (MYCOSTATIN/NYSTOP) powder Apply 1 application topically 2 (two) times daily. Beneath both breasts 60 g 1  . ondansetron (ZOFRAN) 4 MG tablet Take 1 tab by mouth every 6 hours as needed for nausea. 100 tablet 1  . potassium chloride SA (KLOR-CON) 20 MEQ tablet Take 1 tablet (20 mEq total) by mouth daily. 90 tablet 3  . rosuvastatin (CRESTOR) 20 MG tablet Take 1 tablet (20 mg total) by mouth daily. 90 tablet 3  . SUMAtriptan (IMITREX) 50 MG tablet Take 1 tablet (50 mg total) by mouth every 2 (two) hours as needed for migraine. May repeat in 2 hours if headache persists or recurs. 10 tablet 5   No current facility-administered medications for this visit.   Allergies  Allergen Reactions  . Ace Inhibitors     REACTION: chronic cough  . Celebrex [Celecoxib] Other (See Comments)    "makes me bleed"  . Diovan [Valsartan]     angioedema     Review of Systems: All systems reviewed and negative except where noted in HPI.   Lab Results  Component Value Date   WBC 6.9 04/29/2020   HGB 11.7 04/29/2020   HCT 38.3 04/29/2020   MCV 75.4 (L) 04/29/2020   PLT 212 04/29/2020    Lab Results  Component Value Date   CREATININE 0.86 06/10/2020   BUN 17 06/10/2020   NA 140 06/10/2020  K 4.8 06/10/2020   CL 105 06/10/2020   CO2 29 06/10/2020    Lab Results  Component Value Date   ALT 29 06/10/2020   AST 16 06/10/2020   GGT 147 (H) 08/21/2019    ALKPHOS 140 (H) 06/10/2020   BILITOT 0.3 06/10/2020      Physical Exam: BP 140/82   Pulse 67   Ht 5' 4"  (1.626 m)   Wt 232 lb (105.2 kg)   SpO2 97%   BMI 39.82 kg/m  Constitutional: Pleasant,well-developed, female in no acute distress. Neurological: Alert and oriented to person place and time. Psychiatric: Normal mood and affect. Behavior is normal.   ASSESSMENT AND PLAN: 60 year old female here for reassessment of the following:  Dysphagia - work-up as outlined above.  She had a very mild stenosis dilated to 20 mm on prior EGD, this did not provide any benefit to her dysphagia at all.  Unfortunately she had chest pain post procedure and was admitted to the hospital and had what was thought to be a vasospastic episode leading to positive troponin.  Barium swallow shows some thickening of the lower esophagus with delayed emptying, question if she has some underlying GEJ outlet flow obstruction.  Discussed options with her, she has had no benefit with a good dilation in the past, given her complication from endoscopy we will hold off on that for now.  She continues to have both solid and liquid dysphagia, we discussed potential for motility disorder and GJ outflow obstruction, I offered her esophageal manometry to further evaluate for this.  Discussed what the procedure is with her and she wanted to proceed.  Further recommendations pending the results.  Elevated alkaline phosphatase level / hepatic hemangioma / fatty liver - discussed these entities with her at length.  She has a chronic persistent mild elevation in alkaline phosphatase, last checked was 140.  Her imaging shows no problems with biliary ductal dilation.  She does have a large hepatic hemangioma which does not appear to be growing, to been stable in size over the past few years.  Is possible this could be causing elevated alk phos level.  She also has fatty liver but ALT and AST are normal.  Reassured her about this and would  continue to monitor given elevation is mild, I do not think I would pursue biopsy at this time.  No evidence of cirrhosis that is obvious.  She agreed.  We will plan on checking LFTs later this year.  Constipation - now has been using over-the-counter herbal supplement which she likes much better than MiraLAX.  Okay to continue that if it works well for her, she can follow-up as needed for this issue, colonoscopy up-to-date  [place] Cellar, MD [facility] ]

## 2020-07-15 NOTE — Telephone Encounter (Signed)
Patient has earache  she would like antibiotics  sent to the pharmacy   Please advice

## 2020-07-15 NOTE — ED Triage Notes (Signed)
Both ears are stopped up.

## 2020-07-16 LAB — SARS CORONAVIRUS 2 (TAT 6-24 HRS): SARS Coronavirus 2: NEGATIVE

## 2020-07-17 DIAGNOSIS — E119 Type 2 diabetes mellitus without complications: Secondary | ICD-10-CM | POA: Diagnosis not present

## 2020-07-17 DIAGNOSIS — Z0100 Encounter for examination of eyes and vision without abnormal findings: Secondary | ICD-10-CM | POA: Diagnosis not present

## 2020-07-18 ENCOUNTER — Telehealth: Payer: Self-pay | Admitting: Family

## 2020-07-18 DIAGNOSIS — H6090 Unspecified otitis externa, unspecified ear: Secondary | ICD-10-CM

## 2020-07-18 DIAGNOSIS — H9190 Unspecified hearing loss, unspecified ear: Secondary | ICD-10-CM

## 2020-07-18 NOTE — Telephone Encounter (Signed)
Requesting referral to ENT for hearing loss (see er note)

## 2020-07-18 NOTE — Telephone Encounter (Signed)
Patient states she went to ED for ear fullness and losing her hearing . Patients states she would like a referral to ENT - Dr Clotilde Dieter in [place].  Please Advise

## 2020-07-19 ENCOUNTER — Telehealth (INDEPENDENT_AMBULATORY_CARE_PROVIDER_SITE_OTHER): Payer: Medicare Other | Admitting: Family

## 2020-07-19 ENCOUNTER — Other Ambulatory Visit: Payer: Self-pay

## 2020-07-19 DIAGNOSIS — H9203 Otalgia, bilateral: Secondary | ICD-10-CM

## 2020-07-19 DIAGNOSIS — H9193 Unspecified hearing loss, bilateral: Secondary | ICD-10-CM | POA: Diagnosis not present

## 2020-07-19 MED ORDER — PREDNISONE 10 MG PO TABS
ORAL_TABLET | ORAL | 0 refills | Status: DC
Start: 2020-07-19 — End: 2020-09-23

## 2020-07-19 MED ORDER — TRAMADOL HCL 50 MG PO TABS
50.0000 mg | ORAL_TABLET | Freq: Three times a day (TID) | ORAL | 0 refills | Status: AC | PRN
Start: 2020-07-19 — End: 2020-07-24

## 2020-07-19 NOTE — Progress Notes (Addendum)
Virtual Visit via Telephone Note  I connected with [name] on 07/19/20 at  1:00 PM EST by telephone and verified that I am speaking with the correct person using two identifiers.  Location: Patient: home Provider: home   I discussed the limitations, risks, security and privacy concerns of performing an evaluation and management service by telephone and the availability of in person appointments. I also discussed with the patient that there may be a patient responsible charge related to this service. The patient expressed understanding and agreed to proceed. Only the patient and myself participated on today's telephone visit.   History of Present Illness:  Patient reports bilateral ear pain, started Thursday. Friday it got worse. She went to the ED.  ED noted some debris in her canals as well as canal swelling. (Their exam noted no TM redness/bulging).  They flushed her ears. Since that time she reports that she is able to hear even less. She has been using claritin as well. Tylenol is not helping with pain. States she is having trouble sleeping due to the pain.  She had requested a referral to ENT (Dr. Ernesto Rutherford) but it appears that he has retired.      Observations/Objective:  Gen: awake/alert Neuro: speech is clear  Assessment and Plan:  Otalgia/hearing loss-  ? If this is severe eustachian tube dysfunction or if she has very swollen ear canals to the point that she is having trouble hearing.  I advised her as follows:  1) we will refer her to Dr. Lucia Gaskins ENT 2) will begin prednisone taper.  This should help with eustachian tube dysfunction as well as swelling of her ear canals. 3) for severe pain she can use tramadol prn.   4) for cough- she will continue otc delsym and tessalon as needed. Hopefully the prednisone will also help with cough.   5) continue ciprodex ear drops bilaterally.      Follow Up Instructions:    I discussed the assessment and treatment plan  with the patient. The patient was provided an opportunity to ask questions and all were answered. The patient agreed with the plan and demonstrated an understanding of the instructions.   The patient was advised to call back or seek an in-person evaluation if the symptoms worsen or if the condition fails to improve as anticipated.  I provided 11 minutes of non-face-to-face time during this encounter.   [name], NP

## 2020-07-19 NOTE — Addendum Note (Signed)
Addended by: [name] on: 07/19/2020 08:39 AM   Modules accepted: Orders

## 2020-07-22 ENCOUNTER — Other Ambulatory Visit: Payer: Self-pay

## 2020-07-22 ENCOUNTER — Ambulatory Visit (INDEPENDENT_AMBULATORY_CARE_PROVIDER_SITE_OTHER): Payer: Medicare Other | Admitting: Otolaryngology

## 2020-07-22 VITALS — Temp 94.3°F

## 2020-07-22 DIAGNOSIS — H6123 Impacted cerumen, bilateral: Secondary | ICD-10-CM

## 2020-07-22 DIAGNOSIS — H6503 Acute serous otitis media, bilateral: Secondary | ICD-10-CM

## 2020-07-22 NOTE — Progress Notes (Signed)
HPI: [name] is a 60 y.o. female who presents is referred by her PCP for evaluation of ear complaints.  She initially noticed hearing loss and discomfort in her ears a little over a week ago and was seen in the emergency room.  She was given eardrops.  Her hearing did not improved and she was subsequently seen on video call by Sandford CrazeMelissa O'Sullivan, NP.  She was treated with prednisone and tramadol but is continued to have decreased hearing in both ears. Patient has previously had myringotomy tube placed by Dr. Haroldine Lawsrossley several years ago.  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/08- benign   Iron deficiency anemia    Migraine headache    Non-compliance    Peptic ulcer disease    Thyroid disease    Past Surgical History:  Procedure Laterality Date   ABDOMINAL EXPLORATION SURGERY     w/bso    BREAST BIOPSY     CARDIAC CATHETERIZATION  2009   mild non obstructive CAD   CHOLECYSTECTOMY     COLONOSCOPY     KNEE SURGERY  2005    left knee   LEFT HEART CATH AND CORONARY ANGIOGRAPHY N/A 07/17/2017   Procedure: LEFT HEART CATH AND CORONARY ANGIOGRAPHY;  Surgeon: SwazilandJordan, Peter M, MD;  Location: [facility];  Service: Cardiovascular;  Laterality: N/A;   POLYPECTOMY     TOTAL ABDOMINAL HYSTERECTOMY     UPPER GASTROINTESTINAL ENDOSCOPY     Social History   Socioeconomic History   Marital status: Widowed    Spouse name: Not on file   Number of children: 2   Years of education: Not on file    Highest education level: Not on file  Occupational History   Occupation: DISABILITY    Employer: UNEMPLOYED  Tobacco Use   Smoking status: Former Smoker    Packs/day: 0.50    Years: 18.00    Pack years: 9.00    Types: Cigarettes    Start date: 07/29/1977    Quit date: 06/25/1994    Years since quitting: 26.0   Smokeless tobacco: Never Used   Tobacco comment: quit 19 years ago  Vaping Use   Vaping Use: Never used  Substance and Sexual Activity   Alcohol use: Yes    Alcohol/week: 0.0 standard drinks    Comment: social drinker   Drug use: No   Sexual activity: Not Currently  Other Topics Concern   Not on file  Social History Narrative   Widowed   Has 2 grown children.  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   Former Smoker Quit tobacco in 1996.  She was a pack a day smoker for approximately 10 years.   Alcohol use-yes: Social    Daily Caffeine Use:6 pack of pepsi daily     Illicit Drug Use - no    Patient does not get regular exercise.  Smoking Status:  quit   Social Determinants of Health   Financial Resource Strain: Not on file  Food Insecurity: Not on file  Transportation Needs: Not on file  Physical Activity: Not on file  Stress: Not on file  Social Connections: Not on file   Family History  Problem Relation Age of Onset   Hypertension Mother    Rheum arthritis Mother    Hypertension Father    Diabetes Father    Prostate cancer Father    Kidney disease Father    Diabetes Sister    Fibromyalgia Sister    Diabetes Maternal Grandmother    Lung cancer Maternal Grandfather    Arthritis Brother    Migraines Brother    CAD Brother    Fibromyalgia Sister    Migraines Sister    Colon cancer Neg Hx    Thyroid disease Neg Hx    Colon polyps Neg Hx    Allergies  Allergen Reactions   Ace Inhibitors     REACTION: chronic cough   Celebrex [Celecoxib] Other (See Comments)    "makes me  bleed"   Diovan [Valsartan]     angioedema   Prior to Admission medications   Medication Sig Start Date End Date Taking? Authorizing Provider  Albuterol Sulfate 108 (90 Base) MCG/ACT AEPB Inhale 2 puffs into the lungs 4 (four) times daily. 09/01/18   [name], NP  Alirocumab (PRALUENT) 150 MG/ML SOAJ Inject 1 pen into the skin every 14 (fourteen) days. 05/27/20   Fay Records, MD  amLODipine (NORVASC) 10 MG tablet Take 1 tablet by mouth once daily 06/08/20   [name], NP  aspirin 81 MG chewable tablet Chew 1 tablet (81 mg total) by mouth daily. 07/19/17   Samuella Cota, MD  benzonatate (TESSALON) 100 MG capsule Take 1 capsule (100 mg total) by mouth every 8 (eight) hours. 07/15/20   Carlisle Cater, PA-C  cyclobenzaprine (FLEXERIL) 10 MG tablet Take 1 tablet (10 mg total) by mouth 2 (two) times daily as needed for muscle spasms. 02/08/20   [name], NP  fluticasone (FLONASE) 50 MCG/ACT nasal spray Place 2 sprays into both nostrils daily. 07/10/19   Saguier, Percell Miller, PA-C  isosorbide mononitrate (IMDUR) 60 MG 24 hr tablet Take 1 tablet (60 mg total) by mouth daily. 05/27/20   Fay Records, MD  loratadine (CLARITIN) 10 MG tablet Take 1 tablet (10 mg total) by mouth daily. 07/15/20   Carlisle Cater, PA-C  methimazole (TAPAZOLE) 5 MG tablet Take 1 tablet by mouth once daily 06/28/20   Renato Shin, MD  metoprolol succinate (TOPROL-XL) 50 MG 24 hr tablet Take 1 tablet (50 mg total) by mouth daily. Take with or immediately following a meal. 03/18/20   Fay Records, MD  nitroGLYCERIN (NITROSTAT) 0.4 MG SL tablet Place 1 tablet (0.4 mg total) under the tongue every 5 (five) minutes as needed for chest pain. 05/27/20   Fay Records, MD  nystatin (MYCOSTATIN/NYSTOP) powder Apply 1 application topically 2 (two) times daily. Beneath both breasts 06/10/20   [name], NP  ondansetron (ZOFRAN) 4 MG tablet Take 1 tab by mouth every 6 hours as needed for nausea. 06/10/20    [name], NP  potassium chloride SA (KLOR-CON) 20 MEQ tablet Take 1 tablet (20 mEq total) by mouth daily. 06/08/20   Fay Records, MD  predniSONE (DELTASONE) 10 MG tablet 4 tabs by mouth once daily for 2 days, then 3 tabs daily x 2 days, then 2  tabs daily x 2 days, then 1 tab daily x 2 days 07/19/20   [name], NP  rosuvastatin (CRESTOR) 20 MG tablet Take 1 tablet (20 mg total) by mouth daily. 04/01/20   Fay Records, MD  SUMAtriptan (IMITREX) 50 MG tablet Take 1 tablet (50 mg total) by mouth every 2 (two) hours as needed for migraine. May repeat in 2 hours if headache persists or recurs. 06/10/20   [name], NP  traMADol (ULTRAM) 50 MG tablet Take 1 tablet (50 mg total) by mouth every 8 (eight) hours as needed for up to 5 days. 07/19/20 07/24/20  [name], NP     Positive ROS: Otherwise negative  All other systems have been reviewed and were otherwise negative with the exception of those mentioned in the HPI and as above.  Physical Exam: Constitutional: Alert, well-appearing, no acute distress Ears: She had skin lesions or "blackheads" in the concha area of both ears externally.  The ear canals were revealed wax in both ears that was cleaned with suction and curettes.  Both TMs were retracted with poor mobility on pneumatic otoscopy consistent with otitis media with effusion.  On tuning fork testing BC was graded AC bilaterally.  I was able to insufflate air behind both TMs in the office today with improved hearing. Nasal: External nose without lesions. Septum with minimal deformity and mild rhinitis.  Both middle meatus regions were clear with no gross mucopurulent discharge noted..  Oral: Lips and gums without lesions. Tongue and palate mucosa without lesions. Posterior oropharynx clear. Neck: No palpable adenopathy or masses Respiratory: Breathing comfortably  Skin: No facial/neck lesions or rash noted.  Cerumen impaction removal  Date/Time:  07/22/2020 10:48 AM Performed by: Rozetta Nunnery, MD Authorized by: Rozetta Nunnery, MD   Consent:    Consent obtained:  Verbal   Consent given by:  Patient   Risks discussed:  Pain and bleeding Procedure details:    Location:  L ear and R ear   Procedure type: curette, suction and forceps   Post-procedure details:    Inspection:  TM intact and canal normal   Hearing quality:  Improved   Patient tolerance of procedure:  Tolerated well, no immediate complications Comments:     Both TMs are retracted with mucoserous middle ear effusions and conductive hearing loss.    Assessment: Bilateral otitis media with effusion with conductive hearing loss.  Plan: Placed on Augmentin 875 mg twice daily for 2 weeks and also recommended use of Nasacort 2 sprays each nostril at night as she has history of hypertension. She will notify us in 2 weeks if hearing is not returned to normal and we will plan on obtaining audiologic testing at that time.  Possible placement of BMTs depending on audiogram   Radene Journey, MD   CC:

## 2020-07-28 ENCOUNTER — Other Ambulatory Visit (HOSPITAL_COMMUNITY)
Admission: RE | Admit: 2020-07-28 | Discharge: 2020-07-28 | Disposition: A | Discharge status: Home / Self Care | Payer: Medicare Other | Source: Ambulatory Visit | Attending: Gastroenterology | Admitting: Gastroenterology

## 2020-07-28 DIAGNOSIS — Z20822 Contact with and (suspected) exposure to covid-19: Secondary | ICD-10-CM | POA: Insufficient documentation

## 2020-07-28 DIAGNOSIS — Z01812 Encounter for preprocedural laboratory examination: Secondary | ICD-10-CM | POA: Insufficient documentation

## 2020-07-28 LAB — SARS CORONAVIRUS 2 (TAT 6-24 HRS): SARS Coronavirus 2: NEGATIVE

## 2020-08-01 ENCOUNTER — Ambulatory Visit (INDEPENDENT_AMBULATORY_CARE_PROVIDER_SITE_OTHER): Payer: Medicare Other | Admitting: Otolaryngology

## 2020-08-01 ENCOUNTER — Ambulatory Visit (HOSPITAL_COMMUNITY)
Admission: RE | Admit: 2020-08-01 | Discharge: 2020-08-01 | Disposition: A | Discharge status: Home / Self Care | Payer: Medicare Other | Attending: Gastroenterology | Admitting: Gastroenterology

## 2020-08-01 ENCOUNTER — Other Ambulatory Visit: Payer: Self-pay

## 2020-08-01 ENCOUNTER — Encounter (HOSPITAL_COMMUNITY)
Admission: RE | Disposition: A | Discharge status: Home / Self Care | Payer: Self-pay | Source: Home / Self Care | Attending: Gastroenterology

## 2020-08-01 VITALS — Temp 97.5°F

## 2020-08-01 DIAGNOSIS — R1319 Other dysphagia: Secondary | ICD-10-CM

## 2020-08-01 DIAGNOSIS — K224 Dyskinesia of esophagus: Secondary | ICD-10-CM | POA: Diagnosis not present

## 2020-08-01 DIAGNOSIS — K2289 Other specified disease of esophagus: Secondary | ICD-10-CM | POA: Diagnosis not present

## 2020-08-01 DIAGNOSIS — H6523 Chronic serous otitis media, bilateral: Secondary | ICD-10-CM | POA: Diagnosis not present

## 2020-08-01 DIAGNOSIS — H906 Mixed conductive and sensorineural hearing loss, bilateral: Secondary | ICD-10-CM

## 2020-08-01 DIAGNOSIS — R1310 Dysphagia, unspecified: Secondary | ICD-10-CM | POA: Diagnosis present

## 2020-08-01 HISTORY — PX: ESOPHAGEAL MANOMETRY: SHX5429

## 2020-08-01 SURGERY — MANOMETRY, ESOPHAGUS

## 2020-08-01 MED ORDER — LIDOCAINE VISCOUS HCL 2 % MT SOLN
OROMUCOSAL | Status: AC
  Filled 2020-08-01: qty 15

## 2020-08-01 SURGICAL SUPPLY — 2 items
FACESHIELD LNG OPTICON STERILE (SAFETY) IMPLANT
GLOVE BIO SURGEON STRL SZ8 (GLOVE) ×4 IMPLANT

## 2020-08-01 NOTE — Progress Notes (Signed)
HPI: [name] is a 60 y.o. female who returns today for evaluation of decreased hearing in her ears.  She has been treated with Augmentin as well as Nasacort and continues to complain of decreased hearing in the ears but worse on the right side.  She is having no drainage from the ears..  Past Medical History:  Diagnosis Date  . Allergy    allergic rhinitis  . Arthritis   . Atypical chest pain   . Constipation   . Cyst, ovarian   . Diabetes mellitus, type II ([place])   . Elevated alkaline phosphatase level   . Endometriosis   . Esophageal stricture   . Fatty liver   . Gastroparesis   . GERD (gastroesophageal reflux disease)   . Heart murmur   . Hepatic hemangioma   . Hyperlipidemia   . Hypertension   . 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 Iron deficiency anemia   . Migraine headache   . Non-compliance   . Peptic ulcer disease   . Thyroid disease    Past Surgical History:  Procedure Laterality Date  . ABDOMINAL EXPLORATION SURGERY     w/bso   . BREAST BIOPSY    . CARDIAC CATHETERIZATION  2009   mild non obstructive CAD  . CHOLECYSTECTOMY    . COLONOSCOPY    . KNEE SURGERY  2005    left knee  . LEFT HEART CATH AND CORONARY ANGIOGRAPHY N/A 07/17/2017   Procedure: LEFT HEART CATH AND CORONARY ANGIOGRAPHY;  Surgeon: Martinique, Peter M, MD;  Location: [facility];  Service: Cardiovascular;  Laterality: N/A;  . POLYPECTOMY    . TOTAL ABDOMINAL HYSTERECTOMY    . UPPER GASTROINTESTINAL ENDOSCOPY     Social History   Socioeconomic History  . Marital status: Widowed    Spouse name: Not on file  . Number of children: 2  . Years of education: Not on file  . Highest education level: Not on file  Occupational History  . Occupation: DISABILITY    Employer: UNEMPLOYED  Tobacco Use  . Smoking status: Former Smoker    Packs/day: 0.50    Years: 18.00    Pack years: 9.00    Types: Cigarettes    Start date:  07/29/1977    Quit date: 06/25/1994    Years since quitting: 26.1  . Smokeless tobacco: Never Used  . Tobacco comment: quit 19 years ago  Vaping Use  . Vaping Use: Never used  Substance and Sexual Activity  . Alcohol use: Yes    Alcohol/week: 0.0 standard drinks    Comment: social drinker  . Drug use: No  . Sexual activity: Not Currently  Other Topics Concern  . Not on file  Social History Narrative   Widowed   Has 2 grown children.  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   Former Smoker Quit tobacco in 1996.  She was a pack a day smoker for approximately 10 years.   Alcohol use-yes: Social    Daily Caffeine Use:6 pack of pepsi daily     Illicit Drug Use - no    Patient does not get regular exercise.       Smoking Status:  quit   Social Determinants of Health   Financial Resource Strain: Not on file  Food Insecurity: Not on file  Transportation Needs: Not on file  Physical Activity: Not on file  Stress: Not  on file  Social Connections: Not on file   Family History  Problem Relation Age of Onset  . Hypertension Mother   . Rheum arthritis Mother   . Hypertension Father   . Diabetes Father   . Prostate cancer Father   . Kidney disease Father   . Diabetes Sister   . Fibromyalgia Sister   . Diabetes Maternal Grandmother   . Lung cancer Maternal Grandfather   . Arthritis Brother   . Migraines Brother   . CAD Brother   . Fibromyalgia Sister   . Migraines Sister   . Colon cancer Neg Hx   . Thyroid disease Neg Hx   . Colon polyps Neg Hx    Allergies  Allergen Reactions  . Ace Inhibitors     REACTION: chronic cough  . Celebrex [Celecoxib] Other (See Comments)    "makes me bleed"  . Diovan [Valsartan]     angioedema   Prior to Admission medications   Medication Sig Start Date End Date Taking? Authorizing Provider  Albuterol Sulfate 108 (90 Base) MCG/ACT AEPB Inhale 2 puffs into the lungs 4 (four) times daily. 09/01/18    [name], NP  Alirocumab (PRALUENT) 150 MG/ML SOAJ Inject 1 pen into the skin every 14 (fourteen) days. 05/27/20   Fay Records, MD  amLODipine (NORVASC) 10 MG tablet Take 1 tablet by mouth once daily 06/08/20   [name], NP  aspirin 81 MG chewable tablet Chew 1 tablet (81 mg total) by mouth daily. 07/19/17   Samuella Cota, MD  benzonatate (TESSALON) 100 MG capsule Take 1 capsule (100 mg total) by mouth every 8 (eight) hours. 07/15/20   Carlisle Cater, PA-C  cyclobenzaprine (FLEXERIL) 10 MG tablet Take 1 tablet (10 mg total) by mouth 2 (two) times daily as needed for muscle spasms. 02/08/20   [name], NP  fluticasone (FLONASE) 50 MCG/ACT nasal spray Place 2 sprays into both nostrils daily. 07/10/19   Saguier, Percell Miller, PA-C  isosorbide mononitrate (IMDUR) 60 MG 24 hr tablet Take 1 tablet (60 mg total) by mouth daily. 05/27/20   Fay Records, MD  loratadine (CLARITIN) 10 MG tablet Take 1 tablet (10 mg total) by mouth daily. 07/15/20   Carlisle Cater, PA-C  methimazole (TAPAZOLE) 5 MG tablet Take 1 tablet by mouth once daily 06/28/20   Renato Shin, MD  metoprolol succinate (TOPROL-XL) 50 MG 24 hr tablet Take 1 tablet (50 mg total) by mouth daily. Take with or immediately following a meal. 03/18/20   Fay Records, MD  nitroGLYCERIN (NITROSTAT) 0.4 MG SL tablet Place 1 tablet (0.4 mg total) under the tongue every 5 (five) minutes as needed for chest pain. 05/27/20   Fay Records, MD  nystatin (MYCOSTATIN/NYSTOP) powder Apply 1 application topically 2 (two) times daily. Beneath both breasts 06/10/20   [name], NP  ondansetron (ZOFRAN) 4 MG tablet Take 1 tab by mouth every 6 hours as needed for nausea. 06/10/20   [name], NP  potassium chloride SA (KLOR-CON) 20 MEQ tablet Take 1 tablet (20 mEq total) by mouth daily. 06/08/20   Fay Records, MD  predniSONE (DELTASONE) 10 MG tablet 4 tabs by mouth once daily for 2 days, then 3 tabs daily x 2 days, then  2 tabs daily x 2 days, then 1 tab daily x 2 days 07/19/20   [name], NP  rosuvastatin (CRESTOR) 20 MG tablet Take 1 tablet (20 mg total) by mouth daily. 04/01/20   Fay Records,  MD  SUMAtriptan (IMITREX) 50 MG tablet Take 1 tablet (50 mg total) by mouth every 2 (two) hours as needed for migraine. May repeat in 2 hours if headache persists or recurs. 06/10/20   [name], NP     Positive ROS: Otherwise negative  All other systems have been reviewed and were otherwise negative with the exception of those mentioned in the HPI and as above.  Physical Exam: Constitutional: Alert, well-appearing, no acute distress Ears: External ears without lesions or tenderness. Ear canals are clear bilaterally.  TMs were retracted with poor mobility bilaterally consistent with bilateral serous otitis media.  On tuning fork testing Weber lateralized to the right side however BC was graded AC bilaterally.  Subjectively she says she heard little bit better in the left ear.  I went had performed a right M&T in the office today using phenol as a topical anesthetic myringotomy was made in the anterior portion of the TM and a serous effusion was aspirated and a Paparella type I tube was inserted followed by Floxin eardrops which were insufflated into middle ear space. Nasal: External nose without lesions. Septum with mild deformity and moderate rhinitis.. Clear nasal passages Oral: Lips and gums without lesions. Tongue and palate mucosa without lesions. Posterior oropharynx clear. Neck: No palpable adenopathy or masses Respiratory: Breathing comfortably  Skin: No facial/neck lesions or rash noted.  Myringotomy  Date/Time: 08/01/2020 1:04 PM Performed by: Rozetta Nunnery, MD Authorized by: Rozetta Nunnery, MD   Consent:    Consent obtained:  Verbal   Consent given by:  Patient   Risks discussed:  Bleeding and pain   Alternatives discussed:  No treatment Pre-procedure details:     Indications: serous otitis media   Anesthesia:    Anesthesia method:  Topical application   Topical anesthetic:  Phenol Procedure Details:    Hole made in:  Anteroinferior aspect of TM   Tube:  Paparella Type 1 Findings:    Fluid:  Serous fluid Post-procedure details:    Patient tolerance of procedure:  Tolerated well, no immediate complications Comments:     M&T was performed on the right side.  Serous effusion was aspirated.  Floxin drops were insufflated through the tube.    Assessment: Chronic serous otitis media  Plan: She will complete the remaining antibiotics she has as well continue with the Nasacort. She will follow-up in 2 weeks for recheck and audiogram.  At that point she may need a tube placed in the left ear depending on results of audiogram.   Radene Journey, MD

## 2020-08-01 NOTE — Progress Notes (Signed)
Esophageal manometry performed per protocol.  Patient tolerated well.  Report to be sent to Dr. Kavitha Nandigam 

## 2020-08-02 ENCOUNTER — Encounter (HOSPITAL_COMMUNITY): Payer: Self-pay | Admitting: Gastroenterology

## 2020-08-08 ENCOUNTER — Ambulatory Visit (INDEPENDENT_AMBULATORY_CARE_PROVIDER_SITE_OTHER): Payer: Medicare Other | Admitting: Medical

## 2020-08-08 ENCOUNTER — Other Ambulatory Visit: Payer: Self-pay

## 2020-08-08 VITALS — BP 134/72 | HR 64 | Temp 98.9°F | Resp 18 | Ht 64.0 in | Wt 234.0 lb

## 2020-08-08 DIAGNOSIS — L989 Disorder of the skin and subcutaneous tissue, unspecified: Secondary | ICD-10-CM | POA: Diagnosis not present

## 2020-08-08 DIAGNOSIS — L089 Local infection of the skin and subcutaneous tissue, unspecified: Secondary | ICD-10-CM | POA: Diagnosis not present

## 2020-08-08 MED ORDER — CEFTRIAXONE SODIUM 1 G IJ SOLR
1.0000 g | Freq: Once | INTRAMUSCULAR | Status: AC
  Administered 2020-08-08: 1 g via INTRAMUSCULAR

## 2020-08-08 MED ORDER — DOXYCYCLINE HYCLATE 100 MG PO TABS: 100.0000 mg | ORAL_TABLET | Freq: Two times a day (BID) | ORAL | 0 refills | Status: DC

## 2020-08-08 NOTE — Patient Instructions (Addendum)
You have area in front of ear that appears infection. This area appears to be in area of skin lesion.   We gave you rocephin 1 gram im. Start doxycycline oral antibiotic tomorrow morning. Rx advisemement given.  Can apply warm compress twice daily.   If area worsens or change let us know.   Did place referral to dermatologist to evaluate numerous skin lesion area.   Follow 2-3 days or as needed

## 2020-08-08 NOTE — Progress Notes (Signed)
Subjective:    Patient ID: [name], female    DOB: [birth year], 60 y.o.   MRN: [id number]  HPI  Pt in for area in front of left ear that is raised and tender. This area got swollen and tender to touch. Today the area is less tender.  Pt never gotten antibiotic for infection in this area. Never diagnosed with abscess.  Area more painful yesterday. Today feels some better.  Pt just finished taking amoxicillin last Thursday.She was given that before rt ear tube procedure.    Review of Systems  Constitutional: Negative for chills and fatigue.  Respiratory: Negative for cough, chest tightness and wheezing.   Cardiovascular: Negative for chest pain and palpitations.  Gastrointestinal: Negative for abdominal pain.  Musculoskeletal: Negative for back pain.  Skin: Negative for rash.       See hpi.  Neurological: Negative for dizziness, seizures, numbness and headaches.  Hematological: Negative for adenopathy. Does not bruise/bleed easily.  Psychiatric/Behavioral: Negative for behavioral problems and confusion.       Objective:   Physical Exam  General- No acute distress. Pleasant patient. Neck- Full range of motion, no jvd Lungs- Clear, even and unlabored. Heart- regular rate and rhythm. Neurologic- CNII- XII grossly intact. Skin- just in front of left ear dark colored skin lesion with indurated feel and very tender. No fluctuance and no dc. Various dark lesion on ear with similar appearance but no pain.   Past Medical History:  Diagnosis Date  . Allergy    allergic rhinitis  . Arthritis   . Atypical chest pain   . Constipation   . Cyst, ovarian   . Diabetes mellitus, type II ([place])   . Elevated alkaline phosphatase level   . Endometriosis   . Esophageal stricture   . Fatty liver   . Gastroparesis   . GERD (gastroesophageal reflux disease)   . Heart murmur   . Hepatic hemangioma   . Hyperlipidemia   . Hypertension   . 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 Iron deficiency anemia   . Migraine headache   . Non-compliance   . Peptic ulcer disease   . Thyroid disease      Social History   Socioeconomic History  . Marital status: Widowed    Spouse name: Not on file  . Number of children: 2  . Years of education: Not on file  . Highest education level: Not on file  Occupational History  . Occupation: DISABILITY    Employer: UNEMPLOYED  Tobacco Use  . Smoking status: Former Smoker    Packs/day: 0.50    Years: 18.00    Pack years: 9.00    Types: Cigarettes    Start date: 07/29/1977    Quit date: 06/25/1994    Years since quitting: 26.1  . Smokeless tobacco: Never Used  . Tobacco comment: quit 19 years ago  Vaping Use  . Vaping Use: Never used  Substance and Sexual Activity  . Alcohol use: Yes    Alcohol/week: 0.0 standard drinks    Comment: social drinker  . Drug use: No  . Sexual activity: Not Currently  Other Topics Concern  . Not on file  Social History Narrative   Widowed   Has 2 grown children.  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   Former Smoker Quit tobacco in 1996.  She was a pack a day smoker for approximately 10  years.   Alcohol use-yes: Social    Daily Caffeine Use:6 pack of pepsi daily     Illicit Drug Use - no    Patient does not get regular exercise.       Smoking Status:  quit   Social Determinants of Health   Financial Resource Strain: Not on file  Food Insecurity: Not on file  Transportation Needs: Not on file  Physical Activity: Not on file  Stress: Not on file  Social Connections: Not on file  Intimate Partner Violence: Not on file    Past Surgical History:  Procedure Laterality Date  . ABDOMINAL EXPLORATION SURGERY     w/bso   . BREAST BIOPSY    . CARDIAC CATHETERIZATION  2009   mild non obstructive CAD  . CHOLECYSTECTOMY    . COLONOSCOPY    . ESOPHAGEAL MANOMETRY N/A 08/01/2020    Procedure: ESOPHAGEAL MANOMETRY (EM);  Surgeon: Yetta Flock, MD;  Location: [facility];  Service: Gastroenterology;  Laterality: N/A;  . KNEE SURGERY  2005    left knee  . LEFT HEART CATH AND CORONARY ANGIOGRAPHY N/A 07/17/2017   Procedure: LEFT HEART CATH AND CORONARY ANGIOGRAPHY;  Surgeon: Martinique, Peter M, MD;  Location: [facility];  Service: Cardiovascular;  Laterality: N/A;  . POLYPECTOMY    . TOTAL ABDOMINAL HYSTERECTOMY    . UPPER GASTROINTESTINAL ENDOSCOPY      Family History  Problem Relation Age of Onset  . Hypertension Mother   . Rheum arthritis Mother   . Hypertension Father   . Diabetes Father   . Prostate cancer Father   . Kidney disease Father   . Diabetes Sister   . Fibromyalgia Sister   . Diabetes Maternal Grandmother   . Lung cancer Maternal Grandfather   . Arthritis Brother   . Migraines Brother   . CAD Brother   . Fibromyalgia Sister   . Migraines Sister   . Colon cancer Neg Hx   . Thyroid disease Neg Hx   . Colon polyps Neg Hx     Allergies  Allergen Reactions  . Ace Inhibitors     REACTION: chronic cough  . Celebrex [Celecoxib] Other (See Comments)    "makes me bleed"  . Diovan [Valsartan]     angioedema    Current Outpatient Medications on File Prior to Visit  Medication Sig Dispense Refill  . Albuterol Sulfate 108 (90 Base) MCG/ACT AEPB Inhale 2 puffs into the lungs 4 (four) times daily. 1 each 3  . Alirocumab (PRALUENT) 150 MG/ML SOAJ Inject 1 pen into the skin every 14 (fourteen) days. 2 mL 11  . amLODipine (NORVASC) 10 MG tablet Take 1 tablet by mouth once daily 90 tablet 0  . aspirin 81 MG chewable tablet Chew 1 tablet (81 mg total) by mouth daily.    . fluticasone (FLONASE) 50 MCG/ACT nasal spray Place 2 sprays into both nostrils daily. 16 g 1  . isosorbide mononitrate (IMDUR) 60 MG 24 hr tablet Take 1 tablet (60 mg total) by mouth daily. 90 tablet 3  . loratadine (CLARITIN) 10 MG tablet Take 1 tablet (10 mg total) by  mouth daily. 10 tablet 0  . methimazole (TAPAZOLE) 5 MG tablet Take 1 tablet by mouth once daily 90 tablet 3  . metoprolol succinate (TOPROL-XL) 50 MG 24 hr tablet Take 1 tablet (50 mg total) by mouth daily. Take with or immediately following a meal. 90 tablet 3  . nitroGLYCERIN (NITROSTAT) 0.4 MG SL tablet Place  1 tablet (0.4 mg total) under the tongue every 5 (five) minutes as needed for chest pain. 25 tablet 3  . nystatin (MYCOSTATIN/NYSTOP) powder Apply 1 application topically 2 (two) times daily. Beneath both breasts 60 g 1  . ondansetron (ZOFRAN) 4 MG tablet Take 1 tab by mouth every 6 hours as needed for nausea. 100 tablet 1  . potassium chloride SA (KLOR-CON) 20 MEQ tablet Take 1 tablet (20 mEq total) by mouth daily. 90 tablet 3  . rosuvastatin (CRESTOR) 20 MG tablet Take 1 tablet (20 mg total) by mouth daily. 90 tablet 3  . SUMAtriptan (IMITREX) 50 MG tablet Take 1 tablet (50 mg total) by mouth every 2 (two) hours as needed for migraine. May repeat in 2 hours if headache persists or recurs. 10 tablet 5  . benzonatate (TESSALON) 100 MG capsule Take 1 capsule (100 mg total) by mouth every 8 (eight) hours. (Patient not taking: Reported on 08/08/2020) 15 capsule 0  . cyclobenzaprine (FLEXERIL) 10 MG tablet Take 1 tablet (10 mg total) by mouth 2 (two) times daily as needed for muscle spasms. (Patient not taking: Reported on 08/08/2020) 20 tablet 0  . predniSONE (DELTASONE) 10 MG tablet 4 tabs by mouth once daily for 2 days, then 3 tabs daily x 2 days, then 2 tabs daily x 2 days, then 1 tab daily x 2 days (Patient not taking: Reported on 08/08/2020) 20 tablet 0   No current facility-administered medications on file prior to visit.    BP 134/72   Pulse 64   Temp 98.9 F (37.2 C)   Resp 18   Ht 5\' 4"  (1.626 m)   Wt 234 lb (106.1 kg)   SpO2 100%   BMI 40.17 kg/m       Assessment & Plan:  You have area in front of ear that appears infection. This area appears to be in area of skin lesion.    We gave you rocephin 1 gram im. Start doxycycline oral antibiotic tomorrow morning. Rx advisemement given.  Can apply warm compress twice daily.   If area worsens or change let us know.   Did place referral to dermatologist to evaluate numerous skin lesion area.   Follow 2-3 days or as needed  [employer], PA-C

## 2020-08-10 ENCOUNTER — Ambulatory Visit: Payer: Medicare Other | Admitting: Family

## 2020-08-11 DIAGNOSIS — K224 Dyskinesia of esophagus: Secondary | ICD-10-CM

## 2020-08-11 DIAGNOSIS — R1319 Other dysphagia: Secondary | ICD-10-CM

## 2020-08-19 ENCOUNTER — Other Ambulatory Visit: Payer: Self-pay

## 2020-08-19 ENCOUNTER — Ambulatory Visit (INDEPENDENT_AMBULATORY_CARE_PROVIDER_SITE_OTHER): Payer: Medicare Other | Admitting: Otolaryngology

## 2020-08-19 VITALS — Temp 95.5°F

## 2020-08-19 DIAGNOSIS — H9041 Sensorineural hearing loss, unilateral, right ear, with unrestricted hearing on the contralateral side: Secondary | ICD-10-CM | POA: Diagnosis not present

## 2020-08-19 DIAGNOSIS — Z4889 Encounter for other specified surgical aftercare: Secondary | ICD-10-CM

## 2020-08-19 DIAGNOSIS — H9313 Tinnitus, bilateral: Secondary | ICD-10-CM | POA: Diagnosis not present

## 2020-08-19 NOTE — Progress Notes (Signed)
HPI: [name] is a 60 y.o. female who presents 18 days s/p right myringotomy tube placement.  She is doing much better and hearing better.  Audiogram today demonstrated normal hearing in the left ear with a mild hearing loss in the right ear but no conductive component.  SRT's were 20 dB on the left and 30 dB on the right..   Past Medical History:  Diagnosis Date  . Allergy    allergic rhinitis  . Arthritis   . Atypical chest pain   . Constipation   . Cyst, ovarian   . Diabetes mellitus, type II ([place])   . Elevated alkaline phosphatase level   . Endometriosis   . Esophageal stricture   . Fatty liver   . Gastroparesis   . GERD (gastroesophageal reflux disease)   . Heart murmur   . Hepatic hemangioma   . Hyperlipidemia   . Hypertension   . 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 Iron deficiency anemia   . Migraine headache   . Non-compliance   . Peptic ulcer disease   . Thyroid disease    Past Surgical History:  Procedure Laterality Date  . ABDOMINAL EXPLORATION SURGERY     w/bso   . BREAST BIOPSY    . CARDIAC CATHETERIZATION  2009   mild non obstructive CAD  . CHOLECYSTECTOMY    . COLONOSCOPY    . ESOPHAGEAL MANOMETRY N/A 08/01/2020   Procedure: ESOPHAGEAL MANOMETRY (EM);  Surgeon: Yetta Flock, MD;  Location: [facility];  Service: Gastroenterology;  Laterality: N/A;  . KNEE SURGERY  2005    left knee  . LEFT HEART CATH AND CORONARY ANGIOGRAPHY N/A 07/17/2017   Procedure: LEFT HEART CATH AND CORONARY ANGIOGRAPHY;  Surgeon: Martinique, Peter M, MD;  Location: [place] [facility];  Service: Cardiovascular;  Laterality: N/A;  . POLYPECTOMY    . TOTAL ABDOMINAL HYSTERECTOMY    . UPPER GASTROINTESTINAL ENDOSCOPY     Social History   Socioeconomic History  . Marital status: Widowed    Spouse name: Not on file  . Number of children: 2  . Years of education: Not on file  . Highest education level: Not on  file  Occupational History  . Occupation: DISABILITY    Employer: UNEMPLOYED  Tobacco Use  . Smoking status: Former Smoker    Packs/day: 0.50    Years: 18.00    Pack years: 9.00    Types: Cigarettes    Start date: 07/29/1977    Quit date: 06/25/1994    Years since quitting: 26.1  . Smokeless tobacco: Never Used  . Tobacco comment: quit 19 years ago  Vaping Use  . Vaping Use: Never used  Substance and Sexual Activity  . Alcohol use: Yes    Alcohol/week: 0.0 standard drinks    Comment: social drinker  . Drug use: No  . Sexual activity: Not Currently  Other Topics Concern  . Not on file  Social History Narrative   Widowed   Has 2 grown children.  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   Former Smoker Quit tobacco in 1996.  She was a pack a day smoker for approximately 10 years.   Alcohol use-yes: Social    Daily Caffeine Use:6 pack of pepsi daily     Illicit Drug Use - no    Patient does not get regular exercise.       Smoking Status:  quit   Social Determinants of Health   Financial Resource Strain: Not on file  Food Insecurity: Not on file  Transportation Needs: Not on file  Physical Activity: Not on file  Stress: Not on file  Social Connections: Not on file   Family History  Problem Relation Age of Onset  . Hypertension Mother   . Rheum arthritis Mother   . Hypertension Father   . Diabetes Father   . Prostate cancer Father   . Kidney disease Father   . Diabetes Sister   . Fibromyalgia Sister   . Diabetes Maternal Grandmother   . Lung cancer Maternal Grandfather   . Arthritis Brother   . Migraines Brother   . CAD Brother   . Fibromyalgia Sister   . Migraines Sister   . Colon cancer Neg Hx   . Thyroid disease Neg Hx   . Colon polyps Neg Hx    Allergies  Allergen Reactions  . Ace Inhibitors     REACTION: chronic cough  . Celebrex [Celecoxib] Other (See Comments)    "makes me bleed"  . Diovan [Valsartan]      angioedema   Prior to Admission medications   Medication Sig Start Date End Date Taking? Authorizing Provider  Albuterol Sulfate 108 (90 Base) MCG/ACT AEPB Inhale 2 puffs into the lungs 4 (four) times daily. 09/01/18   [name], NP  Alirocumab (PRALUENT) 150 MG/ML SOAJ Inject 1 pen into the skin every 14 (fourteen) days. 05/27/20   Fay Records, MD  amLODipine (NORVASC) 10 MG tablet Take 1 tablet by mouth once daily 06/08/20   [name], NP  aspirin 81 MG chewable tablet Chew 1 tablet (81 mg total) by mouth daily. 07/19/17   Samuella Cota, MD  benzonatate (TESSALON) 100 MG capsule Take 1 capsule (100 mg total) by mouth every 8 (eight) hours. Patient not taking: Reported on 08/08/2020 07/15/20   Carlisle Cater, PA-C  cyclobenzaprine (FLEXERIL) 10 MG tablet Take 1 tablet (10 mg total) by mouth 2 (two) times daily as needed for muscle spasms. Patient not taking: Reported on 08/08/2020 02/08/20   [name], NP  doxycycline (VIBRA-TABS) 100 MG tablet Take 1 tablet (100 mg total) by mouth 2 (two) times daily. 08/08/20   Saguier, Percell Miller, PA-C  fluticasone (FLONASE) 50 MCG/ACT nasal spray Place 2 sprays into both nostrils daily. 07/10/19   Saguier, Percell Miller, PA-C  isosorbide mononitrate (IMDUR) 60 MG 24 hr tablet Take 1 tablet (60 mg total) by mouth daily. 05/27/20   Fay Records, MD  loratadine (CLARITIN) 10 MG tablet Take 1 tablet (10 mg total) by mouth daily. 07/15/20   Carlisle Cater, PA-C  methimazole (TAPAZOLE) 5 MG tablet Take 1 tablet by mouth once daily 06/28/20   Renato Shin, MD  metoprolol succinate (TOPROL-XL) 50 MG 24 hr tablet Take 1 tablet (50 mg total) by mouth daily. Take with or immediately following a meal. 03/18/20   Fay Records, MD  nitroGLYCERIN (NITROSTAT) 0.4 MG SL tablet Place 1 tablet (0.4 mg total) under the tongue every 5 (five) minutes as needed for chest pain. 05/27/20   Fay Records, MD  nystatin (MYCOSTATIN/NYSTOP) powder Apply 1 application  topically 2 (two) times daily. Beneath both breasts 06/10/20   [name], NP  ondansetron (ZOFRAN) 4 MG tablet Take 1 tab by mouth every 6 hours as needed for nausea. 06/10/20   [name], NP  potassium chloride SA (KLOR-CON) 20 MEQ tablet Take 1 tablet (20 mEq total) by  mouth daily. 06/08/20   Fay Records, MD  predniSONE (DELTASONE) 10 MG tablet 4 tabs by mouth once daily for 2 days, then 3 tabs daily x 2 days, then 2 tabs daily x 2 days, then 1 tab daily x 2 days Patient not taking: Reported on 08/08/2020 07/19/20   [name], NP  rosuvastatin (CRESTOR) 20 MG tablet Take 1 tablet (20 mg total) by mouth daily. 04/01/20   Fay Records, MD  SUMAtriptan (IMITREX) 50 MG tablet Take 1 tablet (50 mg total) by mouth every 2 (two) hours as needed for migraine. May repeat in 2 hours if headache persists or recurs. 06/10/20   [name], NP     Physical Exam: The right myringotomy tube is patent with no drainage.  The TM is clear otherwise but thickened.   Assessment: S/p placement of right myringotomy tube for conductive loss and serous otitis.  She is doing much better today.  She does have a mild right ear sensorineural hearing loss.  Plan: Recommend continuation with her nasal steroid spray.  She will follow-up if she notices any hearing problems in the future.  Recommended keeping water out of the right ear.  Discussed with her that the tube will probably extrude within the next 9 to 12 months.   Radene Journey, MD

## 2020-08-25 ENCOUNTER — Encounter (INDEPENDENT_AMBULATORY_CARE_PROVIDER_SITE_OTHER): Payer: Self-pay

## 2020-09-17 ENCOUNTER — Other Ambulatory Visit: Payer: Self-pay | Admitting: Family

## 2020-09-22 ENCOUNTER — Other Ambulatory Visit: Payer: Self-pay

## 2020-09-23 ENCOUNTER — Telehealth: Payer: Self-pay | Admitting: Family

## 2020-09-23 ENCOUNTER — Encounter: Payer: Self-pay | Admitting: Family

## 2020-09-23 ENCOUNTER — Ambulatory Visit (INDEPENDENT_AMBULATORY_CARE_PROVIDER_SITE_OTHER): Payer: Medicare Other | Admitting: Family

## 2020-09-23 ENCOUNTER — Other Ambulatory Visit: Payer: Self-pay

## 2020-09-23 VITALS — BP 142/78 | HR 57 | Temp 98.4°F | Resp 16 | Ht 64.0 in | Wt 230.0 lb

## 2020-09-23 DIAGNOSIS — E0590 Thyrotoxicosis, unspecified without thyrotoxic crisis or storm: Secondary | ICD-10-CM

## 2020-09-23 DIAGNOSIS — Z23 Encounter for immunization: Secondary | ICD-10-CM

## 2020-09-23 DIAGNOSIS — E119 Type 2 diabetes mellitus without complications: Secondary | ICD-10-CM

## 2020-09-23 DIAGNOSIS — E785 Hyperlipidemia, unspecified: Secondary | ICD-10-CM | POA: Diagnosis not present

## 2020-09-23 DIAGNOSIS — Z0000 Encounter for general adult medical examination without abnormal findings: Secondary | ICD-10-CM

## 2020-09-23 DIAGNOSIS — I10 Essential (primary) hypertension: Secondary | ICD-10-CM

## 2020-09-23 DIAGNOSIS — I2510 Atherosclerotic heart disease of native coronary artery without angina pectoris: Secondary | ICD-10-CM | POA: Diagnosis not present

## 2020-09-23 LAB — HEMOGLOBIN A1C: Hgb A1c MFr Bld: 6.6 % — ABNORMAL HIGH (ref 4.6–6.5)

## 2020-09-23 LAB — BASIC METABOLIC PANEL
BUN: 12 mg/dL (ref 6–23)
CO2: 27 mEq/L (ref 19–32)
Calcium: 9.2 mg/dL (ref 8.4–10.5)
Chloride: 104 mEq/L (ref 96–112)
Creatinine, Ser: 0.81 mg/dL (ref 0.40–1.20)
GFR: 78.01 mL/min (ref >60.00)
Glucose, Bld: 115 mg/dL — ABNORMAL HIGH (ref 70–99)
Potassium: 3.9 mEq/L (ref 3.5–5.1)
Sodium: 141 mEq/L (ref 135–145)

## 2020-09-23 MED ORDER — TETANUS-DIPHTHERIA TOXOIDS TD 5-2 LFU IM INJ: 0.5000 mL | INJECTION | Freq: Once | INTRAMUSCULAR | 0 refills | Status: AC

## 2020-09-23 NOTE — Telephone Encounter (Signed)
Could you please call [facility] on [facility] and request date of Shingrix #2?

## 2020-09-23 NOTE — Patient Instructions (Signed)
Please complete lab work prior to leaving. Get Tetanus booster and Covid #4 booster at [facility].

## 2020-09-23 NOTE — Progress Notes (Signed)
Subjective:    Patient ID: [name], female    DOB: 1961/01/20, 60 y.o.   MRN: [id number]  HPI  Patient presents today for complete physical.  Immunizations: pfizer x3, pneumovax 23 booster due as well, tetanus due Diet: trying to eat healthy Wt Readings from Last 3 Encounters:  09/23/20 230 lb (104.3 kg)  08/08/20 234 lb (106.1 kg)  07/15/20 231 lb 14.8 oz (105.2 kg)  Exercise: trying to do some walking Colonoscopy: 2019 Dexa: 2021 Pap Smear: hysterectomy Mammogram: due 11/25/20  DM2- diet controlled.  Lab Results  Component Value Date   HGBA1C 6.3 06/10/2020   HGBA1C 6.4 02/08/2020   HGBA1C 6.9 (H) 07/28/2019   Lab Results  Component Value Date   MICROALBUR <0.7 02/08/2020   LDLCALC 40 05/27/2020   CREATININE 0.86 06/10/2020   HTN- on amlodipine/metoprolol BP Readings from Last 3 Encounters:  09/23/20 (!) 142/78  08/08/20 134/72  07/15/20 (!) 158/89   Hyperlipidemia- on praluent/crestor.   Hyperthyroid- on tapazole.   Review of Systems  Constitutional: Negative for unexpected weight change.  HENT: Negative for hearing loss and rhinorrhea.        Had recent tympanostomy tubes placed  Eyes: Negative for visual disturbance.  Respiratory: Positive for shortness of breath (sometimes with walking, "gets tired quickly" ). Negative for cough.   Cardiovascular: Negative for chest pain.  Gastrointestinal: Negative for constipation and diarrhea.  Genitourinary: Negative for dysuria and frequency.  Musculoskeletal: Negative for arthralgias and myalgias.  Skin: Negative for rash.  Neurological: Negative for headaches.  Hematological: Negative for adenopathy.  Psychiatric/Behavioral:       Denies depression/anxiety   Past Medical History:  Diagnosis Date  . Allergy    allergic rhinitis  . Arthritis   . Atypical chest pain   . Constipation   . Cyst, ovarian   . Diabetes mellitus, type II ([place])   . Elevated alkaline phosphatase level   . Endometriosis    . Esophageal stricture   . Fatty liver   . Gastroparesis   . GERD (gastroesophageal reflux disease)   . Heart murmur   . Hepatic hemangioma   . Hyperlipidemia   . Hypertension   . 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 Iron deficiency anemia   . Migraine headache   . Non-compliance   . Peptic ulcer disease   . Thyroid disease      Social History   Socioeconomic History  . Marital status: Widowed    Spouse name: Not on file  . Number of children: 2  . Years of education: Not on file  . Highest education level: Not on file  Occupational History  . Occupation: DISABILITY    Employer: UNEMPLOYED  Tobacco Use  . Smoking status: Former Smoker    Packs/day: 0.50    Years: 18.00    Pack years: 9.00    Types: Cigarettes    Start date: 07/29/1977    Quit date: 06/25/1994    Years since quitting: 26.2  . Smokeless tobacco: Never Used  . Tobacco comment: quit 19 years ago  Vaping Use  . Vaping Use: Never used  Substance and Sexual Activity  . Alcohol use: Yes    Alcohol/week: 0.0 standard drinks    Comment: social drinker  . Drug use: No  . Sexual activity: Not Currently  Other Topics Concern  . Not on file  Social History Narrative   Widowed   Has 2 grown  children.  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   Former Smoker Quit tobacco in 1996.  She was a pack a day smoker for approximately 10 years.   Alcohol use-yes: Social    Daily Caffeine Use:6 pack of pepsi daily     Illicit Drug Use - no    Patient does not get regular exercise.       Smoking Status:  quit   Social Determinants of Health   Financial Resource Strain: Not on file  Food Insecurity: Not on file  Transportation Needs: Not on file  Physical Activity: Not on file  Stress: Not on file  Social Connections: Not on file  Intimate Partner Violence: Not on file    Past Surgical History:  Procedure  Laterality Date  . ABDOMINAL EXPLORATION SURGERY     w/bso   . BREAST BIOPSY    . CARDIAC CATHETERIZATION  2009   mild non obstructive CAD  . CHOLECYSTECTOMY    . COLONOSCOPY    . ESOPHAGEAL MANOMETRY N/A 08/01/2020   Procedure: ESOPHAGEAL MANOMETRY (EM);  Surgeon: Yetta Flock, MD;  Location: [facility];  Service: Gastroenterology;  Laterality: N/A;  . KNEE SURGERY  2005    left knee  . LEFT HEART CATH AND CORONARY ANGIOGRAPHY N/A 07/17/2017   Procedure: LEFT HEART CATH AND CORONARY ANGIOGRAPHY;  Surgeon: Martinique, Peter M, MD;  Location: [place] [facility];  Service: Cardiovascular;  Laterality: N/A;  . POLYPECTOMY    . TOTAL ABDOMINAL HYSTERECTOMY    . UPPER GASTROINTESTINAL ENDOSCOPY      Family History  Problem Relation Age of Onset  . Hypertension Mother   . Rheum arthritis Mother   . Hypertension Father   . Diabetes Father   . Prostate cancer Father   . Kidney disease Father   . Diabetes Sister   . Fibromyalgia Sister   . Diabetes Maternal Grandmother   . Lung cancer Maternal Grandfather   . Arthritis Brother   . Migraines Brother   . CAD Brother   . Fibromyalgia Sister   . Migraines Sister   . Colon cancer Neg Hx   . Thyroid disease Neg Hx   . Colon polyps Neg Hx     Allergies  Allergen Reactions  . Ace Inhibitors     REACTION: chronic cough  . Celebrex [Celecoxib] Other (See Comments)    "makes me bleed"  . Diovan [Valsartan]     angioedema    Current Outpatient Medications on File Prior to Visit  Medication Sig Dispense Refill  . Albuterol Sulfate 108 (90 Base) MCG/ACT AEPB Inhale 2 puffs into the lungs 4 (four) times daily. 1 each 3  . Alirocumab (PRALUENT) 150 MG/ML SOAJ Inject 1 pen into the skin every 14 (fourteen) days. 2 mL 11  . amLODipine (NORVASC) 10 MG tablet Take 1 tablet by mouth once daily 90 tablet 0  . aspirin 81 MG chewable tablet Chew 1 tablet (81 mg total) by mouth daily.    . cyclobenzaprine (FLEXERIL) 10 MG tablet Take 1  tablet (10 mg total) by mouth 2 (two) times daily as needed for muscle spasms. 20 tablet 0  . doxycycline (VIBRA-TABS) 100 MG tablet Take 1 tablet (100 mg total) by mouth 2 (two) times daily. 14 tablet 0  . fluticasone (FLONASE) 50 MCG/ACT nasal spray Place 2 sprays into both nostrils daily. 16 g 1  . isosorbide mononitrate (IMDUR) 60 MG 24 hr tablet Take 1 tablet (  60 mg total) by mouth daily. 90 tablet 3  . loratadine (CLARITIN) 10 MG tablet Take 1 tablet (10 mg total) by mouth daily. 10 tablet 0  . methimazole (TAPAZOLE) 5 MG tablet Take 1 tablet by mouth once daily 90 tablet 3  . metoprolol succinate (TOPROL-XL) 50 MG 24 hr tablet Take 1 tablet (50 mg total) by mouth daily. Take with or immediately following a meal. 90 tablet 3  . nitroGLYCERIN (NITROSTAT) 0.4 MG SL tablet Place 1 tablet (0.4 mg total) under the tongue every 5 (five) minutes as needed for chest pain. 25 tablet 3  . nystatin (MYCOSTATIN/NYSTOP) powder Apply 1 application topically 2 (two) times daily. Beneath both breasts 60 g 1  . ondansetron (ZOFRAN) 4 MG tablet Take 1 tab by mouth every 6 hours as needed for nausea. 100 tablet 1  . potassium chloride SA (KLOR-CON) 20 MEQ tablet Take 1 tablet (20 mEq total) by mouth daily. 90 tablet 3  . rosuvastatin (CRESTOR) 20 MG tablet Take 1 tablet (20 mg total) by mouth daily. 90 tablet 3  . SUMAtriptan (IMITREX) 50 MG tablet Take 1 tablet (50 mg total) by mouth every 2 (two) hours as needed for migraine. May repeat in 2 hours if headache persists or recurs. 10 tablet 5   No current facility-administered medications on file prior to visit.    BP (!) 142/78 (BP Location: Right Arm, Patient Position: Sitting, Cuff Size: Small)   Pulse (!) 57   Temp 98.4 F (36.9 C) (Oral)   Resp 16   Ht 5\' 4"  (1.626 m)   Wt 230 lb (104.3 kg)   SpO2 99%   BMI 39.48 kg/m       Objective:   Physical Exam  Physical Exam  Constitutional: She is oriented to person, place, and time. She appears  well-developed and well-nourished. No distress.  HENT:  Head: Normocephalic and atraumatic.  Right Ear: + tympanostomy tube right  Left Ear: Tympanic membrane and ear canal normal.  Mouth/Throat: Oropharynx is clear and moist.  Eyes: Pupils are equal, round, and reactive to light. No scleral icterus.  Neck: Normal range of motion. No thyromegaly present.  Cardiovascular: Normal rate and regular rhythm.   No murmur heard. Pulmonary/Chest: Effort normal and breath sounds normal. No respiratory distress. He has no wheezes. She has no rales. She exhibits no tenderness.  Abdominal: Soft. Bowel sounds are normal. She exhibits no distension and no mass. There is no tenderness. There is no rebound and no guarding.  Musculoskeletal: She exhibits no edema.  Lymphadenopathy:    She has no cervical adenopathy.  Neurological: She is alert and oriented to person, place, and time. She has normal patellar reflexes. She exhibits normal muscle tone. Coordination normal.  Skin: Skin is warm and dry.  Psychiatric: She has a normal mood and affect. Her behavior is normal. Judgment and thought content normal.  Breast/pelvic: deferred        Assessment & Plan:   Preventative care- encouraged her to continue to work on healthy diet, exercise, weight loss. Due for COVID booster #4 as well as Td booster- recommended she get from her pharmacy due to insurance.  Pneumovax 23 booster here today.  mammo will be due in June, colo up to date.  DM2- last A1C at goal, obtain follow up A1C/bmet.   HTN-  BP Readings from Last 3 Encounters:  09/23/20 (!) 142/78  08/08/20 134/72  07/15/20 (!) 158/89   BP acceptable. Continue toprol xl 50mg  once  daily, amlodipine 10mg  once daily  Hyperlipidemia- Pt is on crestor 20mg /praluent per cardiology. LDL at goal.   CAD- she does not have chest pain but notes occasional SOB. I have advised pt to schedule a follow up visit with her cardiologist.   Hyperthyroid- followed by  endocrinology (Dr. Loanne Drilling) and maintained on tapazole 5mg . Clinically stable.   Lab Results  Component Value Date   TSH 2.16 04/08/2020    This visit occurred during the SARS-CoV-2 public health emergency.  Safety protocols were in place, including screening questions prior to the visit, additional usage of staff PPE, and extensive cleaning of exam room while observing appropriate contact time as indicated for disinfecting solutions.         Assessment & Plan:

## 2020-09-23 NOTE — Telephone Encounter (Signed)
Shingirx noted in [place]. Documented under immunizations.

## 2020-09-24 ENCOUNTER — Encounter: Payer: Self-pay | Admitting: Family

## 2020-09-26 NOTE — Progress Notes (Signed)
Mailed out to pt 

## 2020-09-28 ENCOUNTER — Other Ambulatory Visit: Payer: Self-pay

## 2020-09-28 ENCOUNTER — Ambulatory Visit (HOSPITAL_BASED_OUTPATIENT_CLINIC_OR_DEPARTMENT_OTHER)
Admission: RE | Admit: 2020-09-28 | Discharge: 2020-09-28 | Disposition: A | Discharge status: Home / Self Care | Payer: Medicare Other | Source: Ambulatory Visit | Attending: Family | Admitting: Family

## 2020-09-28 DIAGNOSIS — Z0000 Encounter for general adult medical examination without abnormal findings: Secondary | ICD-10-CM

## 2020-09-28 DIAGNOSIS — Z1231 Encounter for screening mammogram for malignant neoplasm of breast: Secondary | ICD-10-CM | POA: Diagnosis not present

## 2020-09-29 ENCOUNTER — Ambulatory Visit (HOSPITAL_BASED_OUTPATIENT_CLINIC_OR_DEPARTMENT_OTHER): Payer: Medicare Other

## 2020-10-12 IMAGING — CT CT ABD-PELV W/ CM
2 of 5 series · 16 of 46 positions shown, 18 images · IV contrast (Omnipaque)
Comparison: 03/17/2018

CLINICAL DATA: Elevated alkaline phosphatase level with left-sided
abdominal pain

EXAM:
CT ABDOMEN AND PELVIS WITH CONTRAST
TECHNIQUE: Multidetector CT imaging of the abdomen and pelvis was performed
using the standard protocol following bolus administration of
intravenous contrast.
CONTRAST:  100mL OMNIPAQUE IOHEXOL 300 MG/ML  SOLN

[Series 2: axial st · axial · 0.92mm/px · z∈[-568,-78]mm · 13 of 112 slices shown, 15 images]
[im 7/112  soft-tissue]
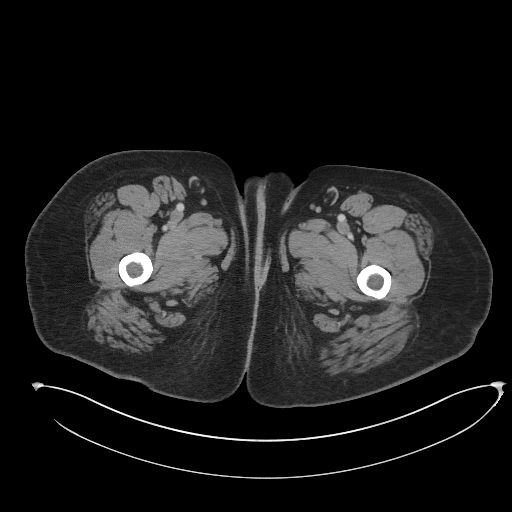
[im 7/112  bone]
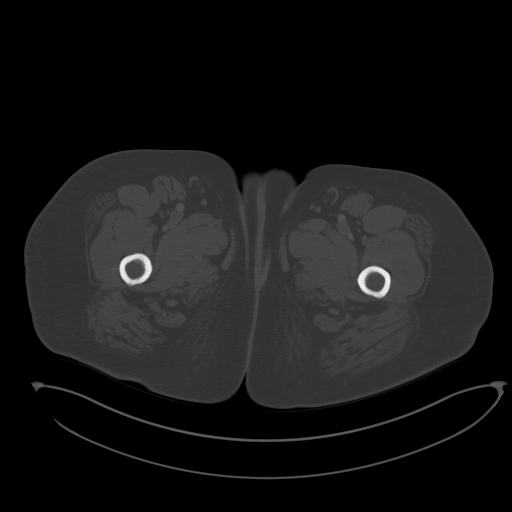
[im 14/112  soft-tissue]
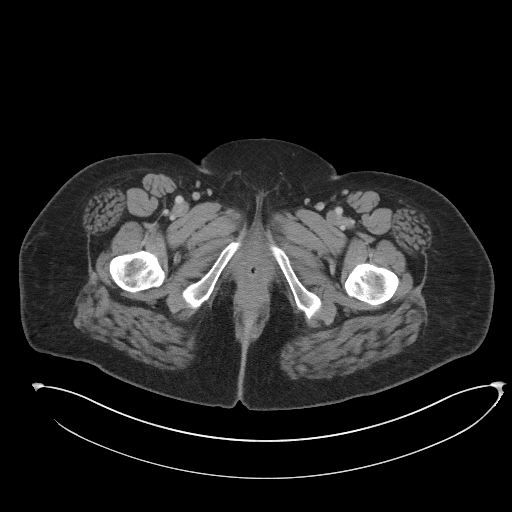
[im 27/112  soft-tissue]
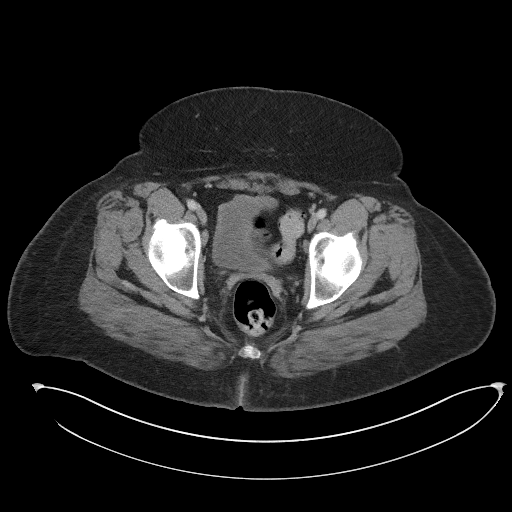
[im 33/112  soft-tissue]
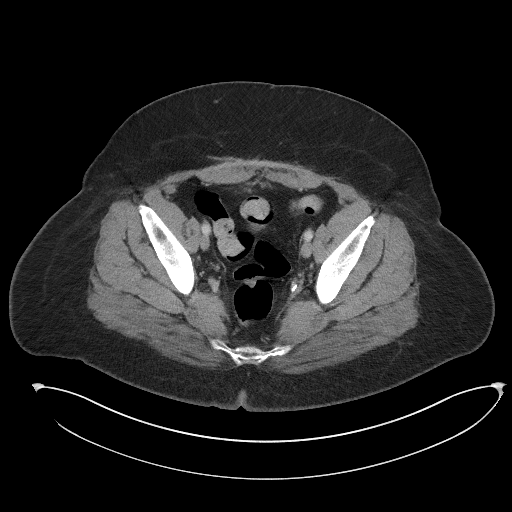
[im 40/112  soft-tissue]
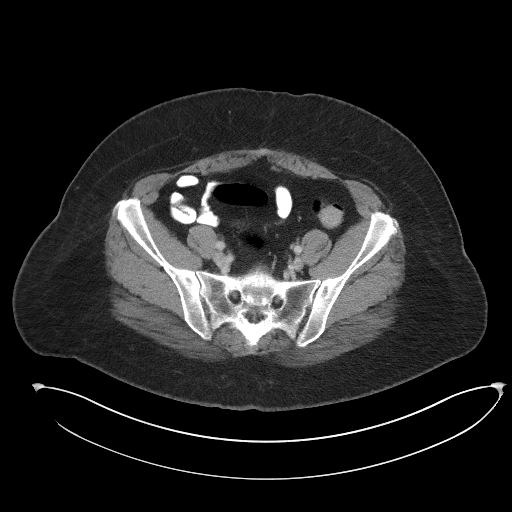
[im 46/112  soft-tissue]
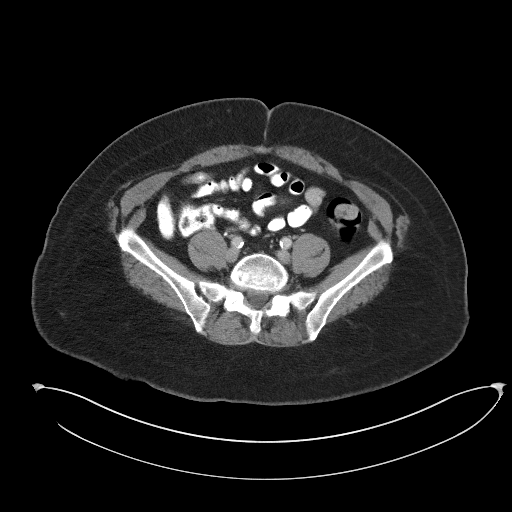
[im 59/112  soft-tissue]
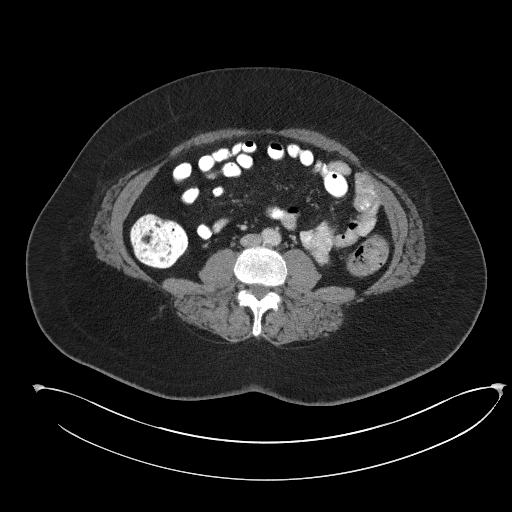
[im 66/112  soft-tissue]
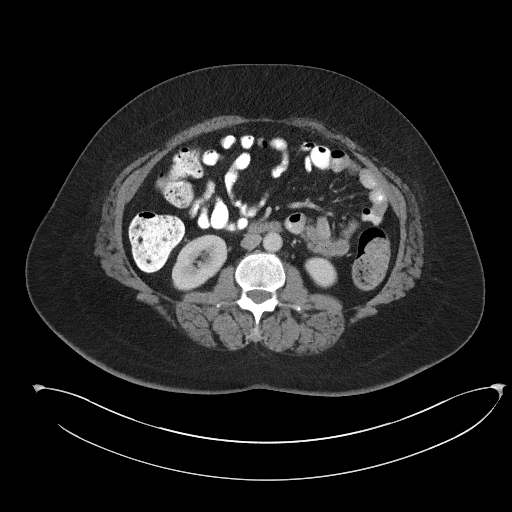
[im 72/112  soft-tissue]
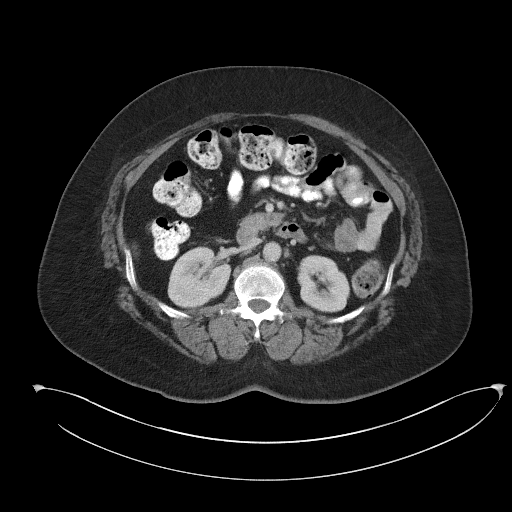
[im 72/112  bone]
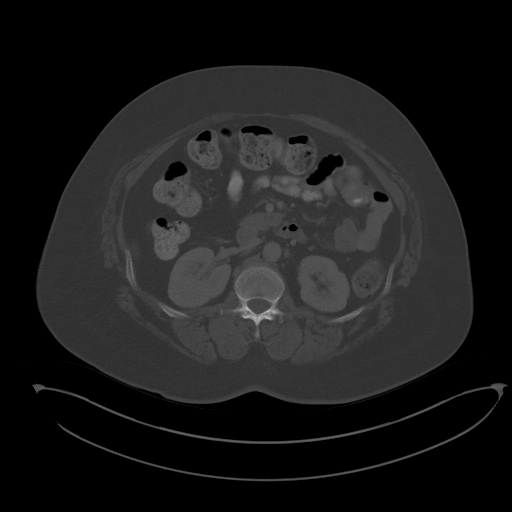
[im 79/112  soft-tissue]
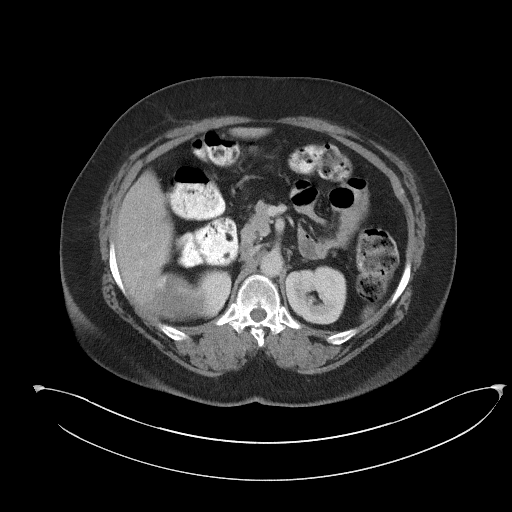
[im 85/112  soft-tissue]
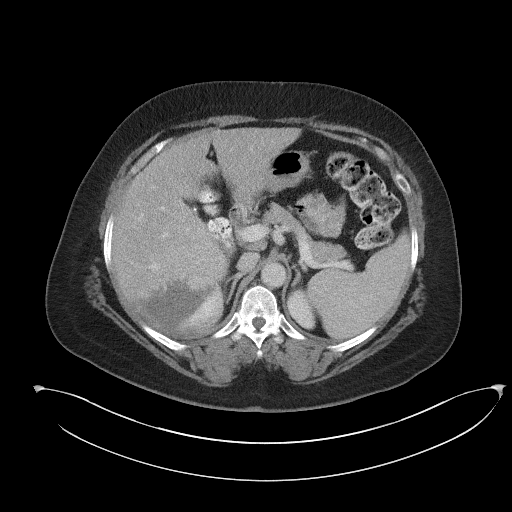
[im 98/112  soft-tissue]
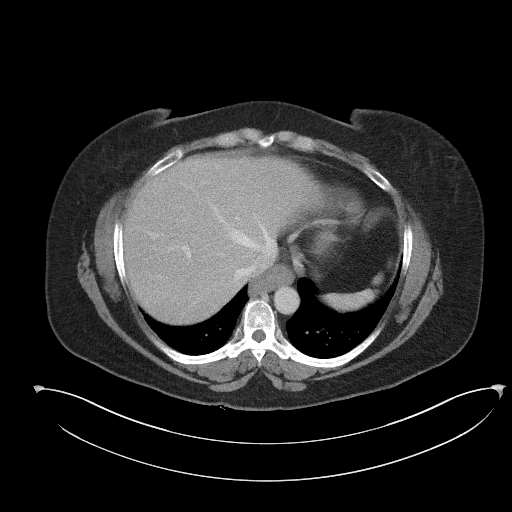
[im 105/112  soft-tissue]
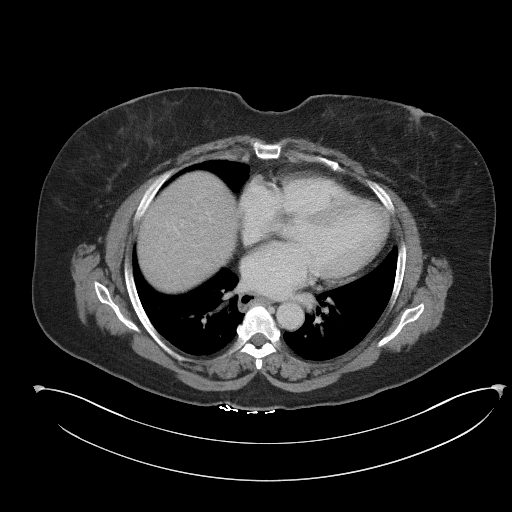

[Series 4: coronal st · coronal · 0.85mm/px · 3 of 110 slices shown]
[im 37/110  soft-tissue]
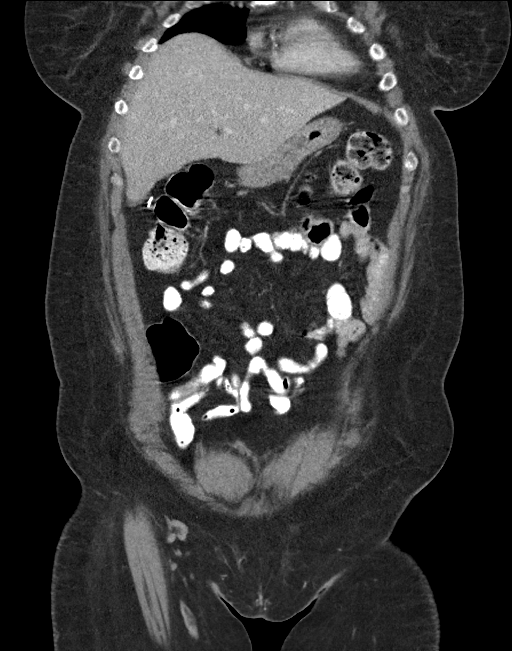
[im 49/110  soft-tissue]
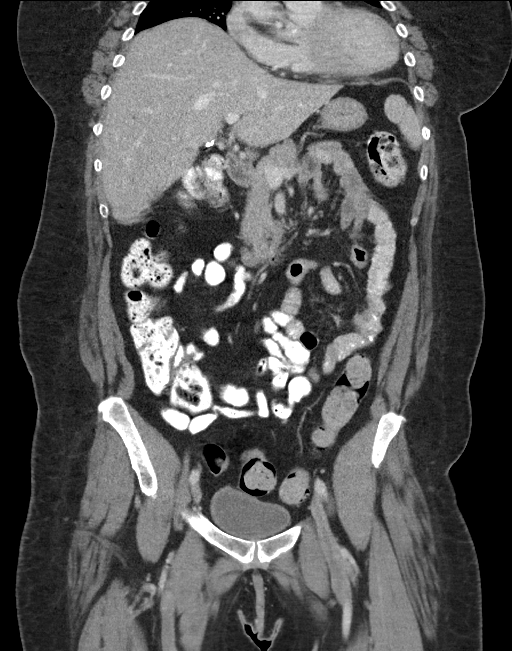
[im 61/110  soft-tissue]
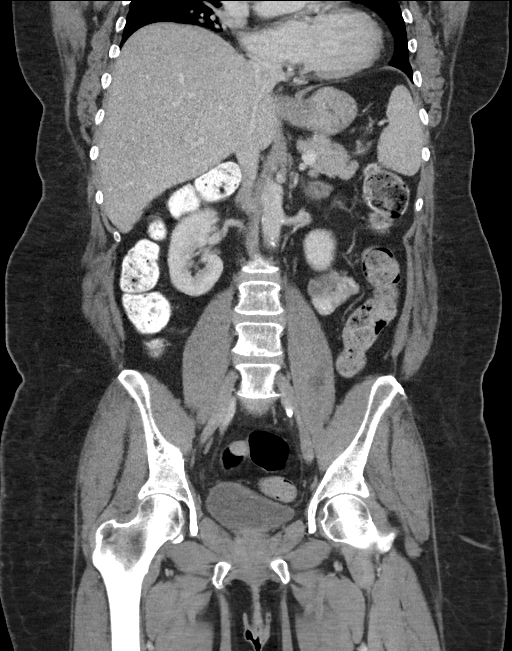

[16 of 46 positions shown; findings below may reference images not displayed]

FINDINGS: Lower chest: No acute abnormality.

Hepatobiliary: There is again noted a hypodense mass lesion within
posterior aspect of the right lobe of the liver measuring
approximately 8.4 cm in greatest dimension with peripheral nodular
enhancement and persistent enhancement on delayed images consistent
with hemangioma. The remainder of the liver demonstrates mild fatty
infiltration. The gallbladder has been surgically removed.

Pancreas: Unremarkable. No pancreatic ductal dilatation or
surrounding inflammatory changes.

Spleen: Normal in size without focal abnormality.

Adrenals/Urinary Tract: Adrenal glands are within normal limits.
Kidneys are well visualized within normal enhancement pattern. No
renal calculi are seen. No obstructive changes are noted. The
bladder is partially distended.

Stomach/Bowel: The appendix is within normal limits. No obstructive
or inflammatory changes of the colon are seen. The small bowel is
within normal limits. Patulous air-filled distal esophagus is seen.

Vascular/Lymphatic: Aortic atherosclerosis. No enlarged abdominal or
pelvic lymph nodes.

Reproductive: Status post hysterectomy. No adnexal masses.

Other: No abdominal wall hernia or abnormality. No abdominopelvic
ascites.

Musculoskeletal: Degenerative changes of lumbar spine are noted.
IMPRESSION: Stable appearing hepatic hemangioma.

Fatty liver.

Chronic changes as described.

## 2020-10-21 ENCOUNTER — Ambulatory Visit (INDEPENDENT_AMBULATORY_CARE_PROVIDER_SITE_OTHER): Payer: Medicare Other | Admitting: Gastroenterology

## 2020-10-21 ENCOUNTER — Encounter: Payer: Self-pay | Admitting: Gastroenterology

## 2020-10-21 ENCOUNTER — Ambulatory Visit (INDEPENDENT_AMBULATORY_CARE_PROVIDER_SITE_OTHER): Payer: Medicare Other | Admitting: Endocrinology

## 2020-10-21 ENCOUNTER — Other Ambulatory Visit: Payer: Self-pay

## 2020-10-21 VITALS — BP 178/84 | HR 60 | Ht 64.0 in | Wt 233.4 lb

## 2020-10-21 VITALS — BP 126/70 | HR 56 | Ht 64.0 in | Wt 232.8 lb

## 2020-10-21 DIAGNOSIS — K5900 Constipation, unspecified: Secondary | ICD-10-CM

## 2020-10-21 DIAGNOSIS — R1310 Dysphagia, unspecified: Secondary | ICD-10-CM

## 2020-10-21 DIAGNOSIS — D1803 Hemangioma of intra-abdominal structures: Secondary | ICD-10-CM

## 2020-10-21 DIAGNOSIS — E0590 Thyrotoxicosis, unspecified without thyrotoxic crisis or storm: Secondary | ICD-10-CM | POA: Diagnosis not present

## 2020-10-21 DIAGNOSIS — K224 Dyskinesia of esophagus: Secondary | ICD-10-CM | POA: Diagnosis not present

## 2020-10-21 DIAGNOSIS — K760 Fatty (change of) liver, not elsewhere classified: Secondary | ICD-10-CM

## 2020-10-21 LAB — TSH: TSH: 2.65 u[IU]/mL (ref 0.35–4.50)

## 2020-10-21 LAB — T4, FREE: Free T4: 0.7 ng/dL (ref 0.60–1.60)

## 2020-10-21 MED ORDER — IMIPRAMINE HCL 25 MG PO TABS: 25.0000 mg | ORAL_TABLET | Freq: Every day | ORAL | 3 refills | Status: DC

## 2020-10-21 NOTE — Progress Notes (Signed)
HPI :  60 year old female here for a follow-up visit for dysphagia, elevated alkaline phosphatase,chronic constipation, fatty liver / hepatic hemangioma.  See prior notes for details of her case.  She has had intermittent dysphagia to both solids and liquids that are longstanding.  She states this bothers her intermittently however seems to have symptoms that bother her more so on a daily basis that she finds bothersome.  She is not having any heartburn at this time.  He has occasional nausea.  She also has some occasional chest discomfort when she swallows.  She states she can have symptoms even with drinking water.  She has had an extensive evaluation for this in the past.  We initially did an EGD in January 2019, she had a small hiatal hernia and a very mild benign stricture at the [place].  She underwent balloon dilation to 20 mm but no mucosal rent was even noted.  She had no relief of dysphagia with that dilation.  Post procedure she had severe chest pain in fact was admitted to the hospital, had no damage to her esophagus but had a troponin elevation and cardiology thought she had a vasospastic cardiac event that led to her symptoms. Since that time she has been followed by Dr. Harrington Challenger in cardiology, she is on nitrates for this issue.  She has otherwise had a barium swallow in regards to these persistent symptoms and it showed mild circumferential thickening of the distal esophagus with delayed emptying however barium tablet passed through without delay.  Since have last seen her she had an esophageal manometry in February which showed evidence of jackhammer esophagus.  I contacted her about the results and recommended trial of peppermint deltoids in her tongue prior to meals.  She states this has not provided any benefit to her symptoms which persist.  She is already taking Norvasc for hypertension and Toprol as well as Imdur daily and oral nitroglycerin as needed for chest pain.  The patient  otherwise has a history of constipation and we have discussed regimens in the past.  She is currently taking "med 7" over-the-counter, herbal laxative which she states works for her and she is happy with the regimen.  She did not think MiraLAX was working as well.  She has had an elevated alkaline phosphatase over the past few years.  GGT is elevated, alkaline phosphatase level has mostly been high 100s to lower 100s.  ALT AST have been normal.  Imaging of her abdomen over the years has shown a stable large hepatic and angioma upwards of 7 to 8 cm in size.  This really has not changed on the last 2 images with CT that of been obtained.  Her alkaline phosphatase level continues to fluctuate slightly, most recently 140s.  He had a serologic work-up otherwise that has been unremarkable for other causes of liver disease.  Prior work-up CT 08/12/19 - IMPRESSION: Stable appearing hepatic hemangioma.8.4 cm in size, mild fatty infiltration Fatty liver.   Flex sig 04/10/2018 -Preparation of the colon was poor, inadquate for assessing for polyps. - Of the visualized mucosa in the left colon, it appeared normal without any inflammatory changes. Biopsied. Hervey Ard / angulated turn in the sigmoid colon Overall, ischemic colitis remains possible to account for prior symptoms although nothing overtly appreciated on today's exam.  EGD 1/22/20219 -Esophagogastric landmarks identified. - 2 cm hiatal hernia. - One suspected benign-appearing esophageal stenosis. Dilated to 7mm without mucosal wrents. - Normal stomach. - A single suspected  duodenal polyp versus less likely ampulla. Biopsied.  Colonoscopy 07/16/2017 -The perianal and digital rectal examinations were normal. - The terminal ileum appeared normal. - The colon was tortuous. - A few medium-mouthed diverticula were found in the sigmoid colon. - Internal hemorrhoids were found during retroflexion. - The exam was otherwise without  abnormality.  GES abnormal 03/07/09 - delayed GES normal 02/03/10  Barium swallow 09/04/19 - IMPRESSION: Mild circumferential narrowing of the distal esophagus resulting in delayed esophageal clearance. A 13 mm barium tablet passes without difficulty. Patient reported presence of typical symptoms during the Study.  Labs Feb - [facility] 2021 - hep panel negative - (+) ANA 1:320, IgG 1243, SMA negative - AMA negative  Repeated AP to 138 on 10/20/19, and then normalized on 01/13/20 to 109. AP again elevated to 162  Mild microcytic anemia but normal iron studies, in fact ferritin noted of 1462 - patient has been on / off iron supplements over the years, followed by hematology  Esophageal manometry 08/01/20 - Jackhammer esophagus  Past Medical History:  Diagnosis Date  . Allergy    allergic rhinitis  . Arthritis   . Atypical chest pain   . Constipation   . Cyst, ovarian   . Diabetes mellitus, type II ([place])   . Elevated alkaline phosphatase level   . Endometriosis   . Esophageal stricture   . Fatty liver   . Gastroparesis   . GERD (gastroesophageal reflux disease)   . Heart murmur   . Hepatic hemangioma   . Hyperlipidemia   . Hypertension   . 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 Iron deficiency anemia   . Migraine headache   . Non-compliance   . Peptic ulcer disease   . Thyroid disease      Past Surgical History:  Procedure Laterality Date  . ABDOMINAL EXPLORATION SURGERY     w/bso   . BREAST BIOPSY    . CARDIAC CATHETERIZATION  2009   mild non obstructive CAD  . CHOLECYSTECTOMY    . COLONOSCOPY    . ESOPHAGEAL MANOMETRY N/A 08/01/2020   Procedure: ESOPHAGEAL MANOMETRY (EM);  Surgeon: Yetta Flock, MD;  Location: [facility];  Service: Gastroenterology;  Laterality: N/A;  . KNEE SURGERY  2005    left knee  . LEFT HEART CATH AND CORONARY ANGIOGRAPHY N/A 07/17/2017   Procedure: LEFT HEART CATH AND  CORONARY ANGIOGRAPHY;  Surgeon: Martinique, Peter M, MD;  Location: [facility];  Service: Cardiovascular;  Laterality: N/A;  . POLYPECTOMY    . TOTAL ABDOMINAL HYSTERECTOMY    . UPPER GASTROINTESTINAL ENDOSCOPY     Family History  Problem Relation Age of Onset  . Hypertension Mother   . Rheum arthritis Mother   . Hypertension Father   . Diabetes Father   . Prostate cancer Father   . Kidney disease Father   . Diabetes Sister   . Fibromyalgia Sister   . Diabetes Maternal Grandmother   . Lung cancer Maternal Grandfather   . Arthritis Brother   . Migraines Brother   . CAD Brother   . Fibromyalgia Sister   . Migraines Sister   . Colon cancer Neg Hx   . Thyroid disease Neg Hx   . Colon polyps Neg Hx    Social History   Tobacco Use  . Smoking status: Former Smoker    Packs/day: 0.50    Years: 18.00    Pack years: 9.00    Types:  Cigarettes    Start date: 07/29/1977    Quit date: 06/25/1994    Years since quitting: 26.3  . Smokeless tobacco: Never Used  . Tobacco comment: quit 19 years ago  Vaping Use  . Vaping Use: Never used  Substance Use Topics  . Alcohol use: Yes    Alcohol/week: 0.0 standard drinks    Comment: social drinker  . Drug use: No   Current Outpatient Medications  Medication Sig Dispense Refill  . Albuterol Sulfate 108 (90 Base) MCG/ACT AEPB Inhale 2 puffs into the lungs 4 (four) times daily. 1 each 3  . Alirocumab (PRALUENT) 150 MG/ML SOAJ Inject 1 pen into the skin every 14 (fourteen) days. 2 mL 11  . amLODipine (NORVASC) 10 MG tablet Take 1 tablet by mouth once daily 90 tablet 0  . aspirin 81 MG chewable tablet Chew 1 tablet (81 mg total) by mouth daily.    . fluticasone (FLONASE) 50 MCG/ACT nasal spray Place 2 sprays into both nostrils daily. 16 g 1  . isosorbide mononitrate (IMDUR) 60 MG 24 hr tablet Take 1 tablet (60 mg total) by mouth daily. 90 tablet 3  . loratadine (CLARITIN) 10 MG tablet Take 1 tablet (10 mg total) by mouth daily. 10 tablet 0   . methimazole (TAPAZOLE) 5 MG tablet Take 1 tablet by mouth once daily 90 tablet 3  . metoprolol succinate (TOPROL-XL) 50 MG 24 hr tablet Take 1 tablet (50 mg total) by mouth daily. Take with or immediately following a meal. 90 tablet 3  . nitroGLYCERIN (NITROSTAT) 0.4 MG SL tablet Place 1 tablet (0.4 mg total) under the tongue every 5 (five) minutes as needed for chest pain. 25 tablet 3  . nystatin (MYCOSTATIN/NYSTOP) powder Apply 1 application topically 2 (two) times daily. Beneath both breasts 60 g 1  . ondansetron (ZOFRAN) 4 MG tablet Take 1 tab by mouth every 6 hours as needed for nausea. 100 tablet 1  . potassium chloride SA (KLOR-CON) 20 MEQ tablet Take 1 tablet (20 mEq total) by mouth daily. 90 tablet 3  . rosuvastatin (CRESTOR) 20 MG tablet Take 1 tablet (20 mg total) by mouth daily. 90 tablet 3  . SUMAtriptan (IMITREX) 50 MG tablet Take 1 tablet (50 mg total) by mouth every 2 (two) hours as needed for migraine. May repeat in 2 hours if headache persists or recurs. 10 tablet 5   No current facility-administered medications for this visit.   Allergies  Allergen Reactions  . Ace Inhibitors     REACTION: chronic cough  . Celebrex [Celecoxib] Other (See Comments)    "makes me bleed"  . Diovan [Valsartan]     angioedema     Review of Systems: All systems reviewed and negative except where noted in HPI.    MM 3D SCREEN BREAST BILATERAL  Result Date: 09/29/2020 CLINICAL DATA:  Screening. EXAM: DIGITAL SCREENING BILATERAL MAMMOGRAM WITH TOMOSYNTHESIS AND CAD TECHNIQUE: Bilateral screening digital craniocaudal and mediolateral oblique mammograms were obtained. Bilateral screening digital breast tomosynthesis was performed. The images were evaluated with computer-aided detection. COMPARISON:  Previous exam(s). ACR Breast Density Category b: There are scattered areas of fibroglandular density. FINDINGS: There are no findings suspicious for malignancy. The images were evaluated with  computer-aided detection. IMPRESSION: No mammographic evidence of malignancy. A result letter of this screening mammogram will be mailed directly to the patient. RECOMMENDATION: Screening mammogram in one year. (Code:SM-B-01Y) BI-RADS CATEGORY  1: Negative. Electronically Signed   By: Lillia Mountain M.D.   On:  09/29/2020 14:41   Lab Results  Component Value Date   WBC 6.9 04/29/2020   HGB 11.7 04/29/2020   HCT 38.3 04/29/2020   MCV 75.4 (L) 04/29/2020   PLT 212 04/29/2020    Lab Results  Component Value Date   CREATININE 0.81 09/23/2020   BUN 12 09/23/2020   NA 141 09/23/2020   K 3.9 09/23/2020   CL 104 09/23/2020   CO2 27 09/23/2020    Lab Results  Component Value Date   ALT 29 06/10/2020   AST 16 06/10/2020   GGT 147 (H) 08/21/2019   ALKPHOS 140 (H) 06/10/2020   BILITOT 0.3 06/10/2020      Physical Exam: BP 126/70   Pulse (!) 56   Ht 5\' 4"  (1.626 m)   Wt 232 lb 12.8 oz (105.6 kg)   BMI 39.96 kg/m  Constitutional: Pleasant,well-developed, female in no acute distress. Neurological: Alert and oriented to person place and time. Psychiatric: Normal mood and affect. Behavior is normal.   ASSESSMENT AND PLAN: 60 year old female here for reassessment of the following:  Jackhammer esophagus Dysphagia Constipation Hepatic hemangioma Fatty liver  Extensive history as outlined above.  Manometry done since her last visit shows evidence of jackhammer esophagus which is the likely cause of dysphagia and perhaps her chest discomfort as well.  We tried peppermint supplement to treat this initially and she had no benefit with that.  We discussed other options.  The next thing I would normally try in this situation would be diltiazem however she is already taking Toprol and already has problems with constipation, thus would consider other options right now.  She is taking long-acting nitrates per cardiology, is using nitroglycerin as needed for chest pain which does help, she can try  taking that sublingual nitrogen prior to eating to see if that helps.  Otherwise I offered her a trial of imipramine which has been shown to be helpful in this condition, dosed at 25 mg nightly.  I discussed what this medication is, potential associated risks, this could make her constipation worse as well.  She should continue her bowel regimen daily.  I told her this can take a few weeks to notice a benefit and would like to try it for at least 4 to 6 weeks prior to determining if it is helping.  He can contact me at that time if no benefit, if its working would continue present dosing.  Other things to consider in this situation if she fails to respond would be diltiazem  (although would need to discuss with her cardiologist about changing her regimen ), Botox injection, versus trial of trazodone, or Viagra.  She will continue her bowel regimen and resume MiraLAX as needed on top of that.  We will continue to monitor her LFTs and will check those yearly moving forward.  Kingwood Cellar, MD [facility]

## 2020-10-21 NOTE — Progress Notes (Signed)
Subjective:    Patient ID: [name], female    DOB: Mar 17, 1961, 60 y.o.   MRN: [id number]  HPI Pt returns for f/u of hyperthyroidism (dx'ed 2012; she chose tapazole rx; she has never had thyroid imaging).  pt states she feels well in general.  Specifically, she denies palpitations and tremor.  Past Medical History:  Diagnosis Date  . Allergy    allergic rhinitis  . Arthritis   . Atypical chest pain   . Constipation   . Cyst, ovarian   . Diabetes mellitus, type II ([place])   . Elevated alkaline phosphatase level   . Endometriosis   . Esophageal stricture   . Fatty liver   . Gastroparesis   . GERD (gastroesophageal reflux disease)   . Heart murmur   . Hepatic hemangioma   . Hyperlipidemia   . Hypertension   . 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 Iron deficiency anemia   . Migraine headache   . Non-compliance   . Peptic ulcer disease   . Thyroid disease     Past Surgical History:  Procedure Laterality Date  . ABDOMINAL EXPLORATION SURGERY     w/bso   . BREAST BIOPSY    . CARDIAC CATHETERIZATION  2009   mild non obstructive CAD  . CHOLECYSTECTOMY    . COLONOSCOPY    . ESOPHAGEAL MANOMETRY N/A 08/01/2020   Procedure: ESOPHAGEAL MANOMETRY (EM);  Surgeon: Yetta Flock, MD;  Location: [facility];  Service: Gastroenterology;  Laterality: N/A;  . KNEE SURGERY  2005    left knee  . LEFT HEART CATH AND CORONARY ANGIOGRAPHY N/A 07/17/2017   Procedure: LEFT HEART CATH AND CORONARY ANGIOGRAPHY;  Surgeon: Martinique, Peter M, MD;  Location: [facility];  Service: Cardiovascular;  Laterality: N/A;  . POLYPECTOMY    . TOTAL ABDOMINAL HYSTERECTOMY    . UPPER GASTROINTESTINAL ENDOSCOPY      Social History   Socioeconomic History  . Marital status: Widowed    Spouse name: Not on file  . Number of children: 2  . Years of education: Not on file  . Highest education level: Not on file  Occupational History   . Occupation: DISABILITY    Employer: UNEMPLOYED  Tobacco Use  . Smoking status: Former Smoker    Packs/day: 0.50    Years: 18.00    Pack years: 9.00    Types: Cigarettes    Start date: 07/29/1977    Quit date: 06/25/1994    Years since quitting: 26.3  . Smokeless tobacco: Never Used  . Tobacco comment: quit 19 years ago  Vaping Use  . Vaping Use: Never used  Substance and Sexual Activity  . Alcohol use: Yes    Alcohol/week: 0.0 standard drinks    Comment: social drinker  . Drug use: No  . Sexual activity: Not Currently  Other Topics Concern  . Not on file  Social History Narrative   Widowed   Has 2 grown children.  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   Former Smoker Quit tobacco in 1996.  She was a pack a day smoker for approximately 10 years.   Alcohol use-yes: Social    Daily Caffeine Use:6 pack of pepsi daily     Illicit Drug Use - no    Patient does not get regular exercise.       Smoking Status:  quit   Social Determinants  of Health   Financial Resource Strain: Not on file  Food Insecurity: Not on file  Transportation Needs: Not on file  Physical Activity: Not on file  Stress: Not on file  Social Connections: Not on file  Intimate Partner Violence: Not on file    Current Outpatient Medications on File Prior to Visit  Medication Sig Dispense Refill  . Albuterol Sulfate 108 (90 Base) MCG/ACT AEPB Inhale 2 puffs into the lungs 4 (four) times daily. 1 each 3  . Alirocumab (PRALUENT) 150 MG/ML SOAJ Inject 1 pen into the skin every 14 (fourteen) days. 2 mL 11  . amLODipine (NORVASC) 10 MG tablet Take 1 tablet by mouth once daily 90 tablet 0  . aspirin 81 MG chewable tablet Chew 1 tablet (81 mg total) by mouth daily.    . fluticasone (FLONASE) 50 MCG/ACT nasal spray Place 2 sprays into both nostrils daily. 16 g 1  . isosorbide mononitrate (IMDUR) 60 MG 24 hr tablet Take 1 tablet (60 mg total) by mouth daily. 90 tablet 3   . loratadine (CLARITIN) 10 MG tablet Take 1 tablet (10 mg total) by mouth daily. 10 tablet 0  . methimazole (TAPAZOLE) 5 MG tablet Take 1 tablet by mouth once daily 90 tablet 3  . metoprolol succinate (TOPROL-XL) 50 MG 24 hr tablet Take 1 tablet (50 mg total) by mouth daily. Take with or immediately following a meal. 90 tablet 3  . nitroGLYCERIN (NITROSTAT) 0.4 MG SL tablet Place 1 tablet (0.4 mg total) under the tongue every 5 (five) minutes as needed for chest pain. 25 tablet 3  . nystatin (MYCOSTATIN/NYSTOP) powder Apply 1 application topically 2 (two) times daily. Beneath both breasts 60 g 1  . ondansetron (ZOFRAN) 4 MG tablet Take 1 tab by mouth every 6 hours as needed for nausea. 100 tablet 1  . potassium chloride SA (KLOR-CON) 20 MEQ tablet Take 1 tablet (20 mEq total) by mouth daily. 90 tablet 3  . rosuvastatin (CRESTOR) 20 MG tablet Take 1 tablet (20 mg total) by mouth daily. 90 tablet 3  . SUMAtriptan (IMITREX) 50 MG tablet Take 1 tablet (50 mg total) by mouth every 2 (two) hours as needed for migraine. May repeat in 2 hours if headache persists or recurs. 10 tablet 5   No current facility-administered medications on file prior to visit.    Allergies  Allergen Reactions  . Ace Inhibitors     REACTION: chronic cough  . Celebrex [Celecoxib] Other (See Comments)    "makes me bleed"  . Diovan [Valsartan]     angioedema    Family History  Problem Relation Age of Onset  . Hypertension Mother   . Rheum arthritis Mother   . Hypertension Father   . Diabetes Father   . Prostate cancer Father   . Kidney disease Father   . Diabetes Sister   . Fibromyalgia Sister   . Diabetes Maternal Grandmother   . Lung cancer Maternal Grandfather   . Arthritis Brother   . Migraines Brother   . CAD Brother   . Fibromyalgia Sister   . Migraines Sister   . Colon cancer Neg Hx   . Thyroid disease Neg Hx   . Colon polyps Neg Hx     BP (!) 178/84 (BP Location: Right Arm, Patient Position:  Sitting, Cuff Size: Large)   Pulse 60   Ht 5\' 4"  (1.626 m)   Wt 233 lb 6.4 oz (105.9 kg)   SpO2 97%   BMI 40.06  kg/m    Review of Systems Denies fever    Objective:   Physical Exam VITAL SIGNS:  See vs page.   GENERAL: no distress.  NECK: There is no palpable thyroid enlargement.  No thyroid nodule is palpable.  No palpable lymphadenopathy at the anterior neck.   Lab Results  Component Value Date   TSH 2.65 10/21/2020   T4TOTAL 12.3 11/28/2011       Assessment & Plan:  Hyperthyroidism: well-controlled: Please continue the same methimazole

## 2020-10-21 NOTE — Patient Instructions (Signed)
Blood tests are requested for you today.  We'll let you know about the results.  If ever you have fever while taking methimazole, stop it and call us, even if the reason is obvious, because of the risk of a rare side-effect.   It is best to never miss the medication.  However, if you do miss it, next best is to double up the next time.   Please come back for a follow-up appointment in 6 months.

## 2020-10-21 NOTE — Patient Instructions (Signed)
We have sent the following medications to your pharmacy for you to pick up at your convenience:  Imipramine.  You may take your Nitroglycerin as needed.  Continue your bowel regimen.  Please call back in 6 weeks and give up update to Dr. Doyne Keel nurse, [facility]

## 2020-11-17 ENCOUNTER — Telehealth: Payer: Self-pay

## 2020-11-17 ENCOUNTER — Other Ambulatory Visit: Payer: Self-pay | Admitting: Family

## 2020-11-17 NOTE — Telephone Encounter (Signed)
Called and LM for to go to the lab for LFTs. Sent letter as well.

## 2020-11-17 NOTE — Telephone Encounter (Signed)
-----   Message from Roetta Sessions, [place] sent at 07/15/2020 11:34 AM EST ----- Regarding: labs due in June Patient due for LFTs in June

## 2020-11-18 ENCOUNTER — Other Ambulatory Visit (INDEPENDENT_AMBULATORY_CARE_PROVIDER_SITE_OTHER): Payer: Medicare Other

## 2020-11-18 DIAGNOSIS — D1803 Hemangioma of intra-abdominal structures: Secondary | ICD-10-CM

## 2020-11-18 DIAGNOSIS — R1319 Other dysphagia: Secondary | ICD-10-CM | POA: Diagnosis not present

## 2020-11-18 DIAGNOSIS — R748 Abnormal levels of other serum enzymes: Secondary | ICD-10-CM

## 2020-11-18 DIAGNOSIS — K760 Fatty (change of) liver, not elsewhere classified: Secondary | ICD-10-CM

## 2020-11-18 DIAGNOSIS — K5909 Other constipation: Secondary | ICD-10-CM | POA: Diagnosis not present

## 2020-11-18 LAB — HEPATIC FUNCTION PANEL
ALT: 19 U/L (ref 0–35)
AST: 15 U/L (ref 0–37)
Albumin: 4.3 g/dL (ref 3.5–5.2)
Alkaline Phosphatase: 126 U/L — ABNORMAL HIGH (ref 39–117)
Bilirubin, Direct: 0.1 mg/dL (ref 0.0–0.3)
Total Bilirubin: 0.4 mg/dL (ref 0.2–1.2)
Total Protein: 7.5 g/dL (ref 6.0–8.3)

## 2020-11-25 ENCOUNTER — Other Ambulatory Visit: Payer: Medicare Other

## 2020-12-19 ENCOUNTER — Encounter: Payer: Self-pay | Admitting: Family

## 2020-12-23 ENCOUNTER — Ambulatory Visit: Payer: Medicare Other | Admitting: Family

## 2020-12-29 ENCOUNTER — Ambulatory Visit: Payer: Medicare Other | Admitting: Physician Assistant

## 2020-12-30 ENCOUNTER — Ambulatory Visit (INDEPENDENT_AMBULATORY_CARE_PROVIDER_SITE_OTHER): Payer: Medicare Other | Admitting: Family

## 2020-12-30 ENCOUNTER — Other Ambulatory Visit: Payer: Self-pay

## 2020-12-30 VITALS — BP 139/76 | HR 61 | Temp 98.7°F | Resp 16 | Ht 64.5 in | Wt 229.0 lb

## 2020-12-30 DIAGNOSIS — I10 Essential (primary) hypertension: Secondary | ICD-10-CM | POA: Diagnosis not present

## 2020-12-30 DIAGNOSIS — R7303 Prediabetes: Secondary | ICD-10-CM | POA: Diagnosis not present

## 2020-12-30 DIAGNOSIS — D509 Iron deficiency anemia, unspecified: Secondary | ICD-10-CM | POA: Diagnosis not present

## 2020-12-30 DIAGNOSIS — R0600 Dyspnea, unspecified: Secondary | ICD-10-CM | POA: Diagnosis not present

## 2020-12-30 DIAGNOSIS — E559 Vitamin D deficiency, unspecified: Secondary | ICD-10-CM | POA: Diagnosis not present

## 2020-12-30 DIAGNOSIS — J309 Allergic rhinitis, unspecified: Secondary | ICD-10-CM

## 2020-12-30 DIAGNOSIS — R0609 Other forms of dyspnea: Secondary | ICD-10-CM | POA: Insufficient documentation

## 2020-12-30 LAB — BASIC METABOLIC PANEL
BUN: 16 mg/dL (ref 6–23)
CO2: 26 mEq/L (ref 19–32)
Calcium: 9 mg/dL (ref 8.4–10.5)
Chloride: 105 mEq/L (ref 96–112)
Creatinine, Ser: 0.84 mg/dL (ref 0.40–1.20)
GFR: 74.54 mL/min (ref >60.00)
Glucose, Bld: 105 mg/dL — ABNORMAL HIGH (ref 70–99)
Potassium: 4.3 mEq/L (ref 3.5–5.1)
Sodium: 140 mEq/L (ref 135–145)

## 2020-12-30 LAB — CBC WITH DIFFERENTIAL/PLATELET
Basophils Absolute: 0 10*3/uL (ref 0.0–0.1)
Basophils Relative: 0.3 % (ref 0.0–3.0)
Eosinophils Absolute: 0.1 10*3/uL (ref 0.0–0.7)
Eosinophils Relative: 0.8 % (ref 0.0–5.0)
HCT: 36.7 % (ref 36.0–46.0)
Hemoglobin: 11.4 g/dL — ABNORMAL LOW (ref 12.0–15.0)
Lymphocytes Relative: 34.2 % (ref 12.0–46.0)
Lymphs Abs: 2.2 10*3/uL (ref 0.7–4.0)
MCHC: 31.1 g/dL (ref 30.0–36.0)
MCV: 71.8 fl — ABNORMAL LOW (ref 78.0–100.0)
Monocytes Absolute: 0.4 10*3/uL (ref 0.1–1.0)
Monocytes Relative: 6.3 % (ref 3.0–12.0)
Neutro Abs: 3.8 10*3/uL (ref 1.4–7.7)
Neutrophils Relative %: 58.4 % (ref 43.0–77.0)
Platelets: 207 10*3/uL (ref 150.0–400.0)
RBC: 5.11 Mil/uL (ref 3.87–5.11)
RDW: 14.8 % (ref 11.5–15.5)
WBC: 6.5 10*3/uL (ref 4.0–10.5)

## 2020-12-30 LAB — FERRITIN: Ferritin: 749 ng/mL — ABNORMAL HIGH (ref 10.0–291.0)

## 2020-12-30 LAB — HEMOGLOBIN A1C: Hgb A1c MFr Bld: 6.8 % — ABNORMAL HIGH (ref 4.6–6.5)

## 2020-12-30 LAB — IRON: Iron: 75 ug/dL (ref 42–145)

## 2020-12-30 LAB — VITAMIN D 25 HYDROXY (VIT D DEFICIENCY, FRACTURES): VITD: 12.98 ng/mL — ABNORMAL LOW (ref 30.00–100.00)

## 2020-12-30 NOTE — Patient Instructions (Addendum)
Please get your second covid booster downstairs at the pharmacy.  Complete lab work prior to leaving.

## 2020-12-30 NOTE — Assessment & Plan Note (Signed)
Lab Results  Component Value Date   HGBA1C 6.6 (H) 09/23/2020

## 2020-12-30 NOTE — Assessment & Plan Note (Signed)
Fair control on claritin 10mg  continue same.

## 2020-12-30 NOTE — Assessment & Plan Note (Signed)
?   If related to anemia.  Will arrange follow up with cardiology. She is advised to go to the ER if chest pain, worsening SOB.

## 2020-12-30 NOTE — Assessment & Plan Note (Signed)
BP at goal. Continue amlodipien 10mg  and toprol x 50mg .

## 2020-12-30 NOTE — Progress Notes (Signed)
Subjective:   By signing my name below, I, Shehryar Baig, attest that this documentation has been prepared under the direction and in the presence of [name]. 12/30/2020     Patient ID: [name], female    DOB: February 11, 1961, 60 y.o.   MRN: [id number]  Chief Complaint  Patient presents with   Hypertension    Here for follow up    Anemia    History of anemia, "has been feeling tired"    HPI Patient is in today for a office visit.  She is doing well at this time.  SOB- She complains of occasional SOB while walking. She denies having any chest pain with the SOB. She is interested in making an appointment with her cardiologist to manage her symptoms. Immunizations- She 3 Covid-19 vaccines at this time and is interested in getting the 2nd booster vaccine.  Inhaler- She occasionally uses her ProAir inhaler to manage her breathing. Bowel movents- She reports no new issues regarding her bowel movements at this time. Fatigue- She reports having fatigue recently and thinks its due to having low iron levels. Swallowing- She reports that she is swallowing ok at this time. Allergies- Her allergies are mild around this time of the year. She uses OTC Claritin to manage her symptoms. Hypertension- She continues taking 10 mg amlodipine daily PO and 50 mg metoprolol succinate daily PO to manage her blood pressure. She reports doing well on these medications.  BP Readings from Last 3 Encounters:  12/30/20 139/76  10/21/20 126/70  10/21/20 (!) 178/84   Diabetes- Her a1c levels are well controled at this time. She continues managing her symptoms through dieting.  Lab Results  Component Value Date   HGBA1C 6.6 (H) 09/23/2020    Health Maintenance Due  Topic Date Due   COVID-19 Vaccine (4 - Booster for Pfizer series) 08/20/2020   URINE MICROALBUMIN  02/07/2021    Past Medical History:  Diagnosis Date   Allergy    allergic rhinitis   Arthritis    Atypical chest pain     Constipation    Cyst, ovarian    Diabetes mellitus, type II (HCC)    Elevated alkaline phosphatase level    Endometriosis    Esophageal stricture    Fatty liver    Gastroparesis    GERD (gastroesophageal reflux disease)    Heart murmur    Hepatic hemangioma    Hyperlipidemia    Hypertension    Hypertrophic condition of skin    acrokeratoelastoidosis- s/p 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 1/08- benign   Iron deficiency anemia    Migraine headache    Non-compliance    Peptic ulcer disease    Thyroid disease     Past Surgical History:  Procedure Laterality Date   ABDOMINAL EXPLORATION SURGERY     w/bso    BREAST BIOPSY     CARDIAC CATHETERIZATION  2009   mild non obstructive CAD   CHOLECYSTECTOMY     COLONOSCOPY     ESOPHAGEAL MANOMETRY N/A 08/01/2020   Procedure: ESOPHAGEAL MANOMETRY (EM);  Surgeon: Yetta Flock, MD;  Location: [facility];  Service: Gastroenterology;  Laterality: N/A;   KNEE SURGERY  2005    left knee   LEFT HEART CATH AND CORONARY ANGIOGRAPHY N/A 07/17/2017   Procedure: LEFT HEART CATH AND CORONARY ANGIOGRAPHY;  Surgeon: Martinique, Peter M, MD;  Location: [facility];  Service: Cardiovascular;  Laterality: N/A;   POLYPECTOMY     TOTAL ABDOMINAL HYSTERECTOMY  UPPER GASTROINTESTINAL ENDOSCOPY      Family History  Problem Relation Age of Onset   Hypertension Mother    Rheum arthritis Mother    Hypertension Father    Diabetes Father    Prostate cancer Father    Kidney disease Father    Diabetes Sister    Fibromyalgia Sister    Diabetes Maternal Grandmother    Lung cancer Maternal Grandfather    Arthritis Brother    Migraines Brother    CAD Brother    Fibromyalgia Sister    Migraines Sister    Colon cancer Neg Hx    Thyroid disease Neg Hx    Colon polyps Neg Hx     Social History   Socioeconomic History   Marital status: Widowed    Spouse name: Not on file   Number of children: 2   Years of education: Not on file   Highest education  level: Not on file  Occupational History   Occupation: DISABILITY    Employer: UNEMPLOYED  Tobacco Use   Smoking status: Former    Packs/day: 0.50    Years: 18.00    Pack years: 9.00    Types: Cigarettes    Start date: 07/29/1977    Quit date: 06/25/1994    Years since quitting: 26.5   Smokeless tobacco: Never   Tobacco comments:    quit 19 years ago  Vaping Use   Vaping Use: Never used  Substance and Sexual Activity   Alcohol use: Yes    Alcohol/week: 0.0 standard drinks    Comment: social drinker   Drug use: No   Sexual activity: Not Currently  Other Topics Concern   Not on file  Social History Narrative   Widowed   Has 2 grown children.  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    Former Smoker Quit tobacco in 1996.  She was a pack a day smoker for approximately 10 years.    Alcohol use-yes: Social    Daily Caffeine Use:6 pack of pepsi daily     Illicit Drug Use - no    Patient does not get regular exercise.       Smoking Status:  quit   Social Determinants of Health   Financial Resource Strain: Not on file  Food Insecurity: Not on file  Transportation Needs: Not on file  Physical Activity: Not on file  Stress: Not on file  Social Connections: Not on file  Intimate Partner Violence: Not on file    Outpatient Medications Prior to Visit  Medication Sig Dispense Refill   Albuterol Sulfate 108 (90 Base) MCG/ACT AEPB Inhale 2 puffs into the lungs 4 (four) times daily. 1 each 3   Alirocumab (PRALUENT) 150 MG/ML SOAJ Inject 1 pen into the skin every 14 (fourteen) days. 2 mL 11   amLODipine (NORVASC) 10 MG tablet Take 1 tablet by mouth once daily 90 tablet 0   aspirin 81 MG chewable tablet Chew 1 tablet (81 mg total) by mouth daily.     fluticasone (FLONASE) 50 MCG/ACT nasal spray Place 2 sprays into both nostrils daily. 16 g 1   imipramine (TOFRANIL) 25 MG tablet Take 1 tablet (25 mg total) by mouth at bedtime. 30 tablet 3    isosorbide mononitrate (IMDUR) 60 MG 24 hr tablet Take 1 tablet (60 mg total) by mouth daily. 90 tablet 3   loratadine (CLARITIN) 10 MG tablet Take 1 tablet (10 mg total) by mouth daily.  10 tablet 0   methimazole (TAPAZOLE) 5 MG tablet Take 1 tablet by mouth once daily 90 tablet 3   metoprolol succinate (TOPROL-XL) 50 MG 24 hr tablet Take 1 tablet (50 mg total) by mouth daily. Take with or immediately following a meal. 90 tablet 3   nitroGLYCERIN (NITROSTAT) 0.4 MG SL tablet Place 1 tablet (0.4 mg total) under the tongue every 5 (five) minutes as needed for chest pain. 25 tablet 3   nystatin (MYCOSTATIN/NYSTOP) powder Apply 1 application topically 2 (two) times daily. Beneath both breasts 60 g 1   ondansetron (ZOFRAN) 4 MG tablet Take 1 tab by mouth every 6 hours as needed for nausea. 100 tablet 1   potassium chloride SA (KLOR-CON) 20 MEQ tablet Take 1 tablet (20 mEq total) by mouth daily. 90 tablet 3   rosuvastatin (CRESTOR) 20 MG tablet Take 1 tablet (20 mg total) by mouth daily. 90 tablet 3   SUMAtriptan (IMITREX) 50 MG tablet Take 1 tablet (50 mg total) by mouth every 2 (two) hours as needed for migraine. May repeat in 2 hours if headache persists or recurs. 10 tablet 5   No facility-administered medications prior to visit.    Allergies  Allergen Reactions   Ace Inhibitors     REACTION: chronic cough   Celebrex [Celecoxib] Other (See Comments)    "makes me bleed"   Diovan [Valsartan]     angioedema    Review of Systems  Constitutional:  Positive for malaise/fatigue.  Respiratory:  Positive for shortness of breath (Occasionaly while walking).   Cardiovascular:  Negative for chest pain.      Objective:    Physical Exam Constitutional:      General: She is not in acute distress.    Appearance: Normal appearance. She is not ill-appearing.  HENT:     Head: Normocephalic and atraumatic.     Right Ear: External ear normal.     Left Ear: External ear normal.  Eyes:      Extraocular Movements: Extraocular movements intact.     Pupils: Pupils are equal, round, and reactive to light.  Cardiovascular:     Rate and Rhythm: Normal rate and regular rhythm.     Pulses: Normal pulses.     Heart sounds: Normal heart sounds. No murmur heard.   No gallop.  Pulmonary:     Effort: Pulmonary effort is normal. No respiratory distress.     Breath sounds: Normal breath sounds. No wheezing, rhonchi or rales.  Skin:    General: Skin is warm and dry.  Neurological:     Mental Status: She is alert and oriented to person, place, and time.  Psychiatric:        Behavior: Behavior normal.    BP 139/76 (BP Location: Right Arm, Patient Position: Sitting, Cuff Size: Large)   Pulse 61   Temp 98.7 F (37.1 C) (Oral)   Resp 16   Ht 5' 4.5" (1.638 m)   Wt 229 lb (103.9 kg)   SpO2 100%   BMI 38.70 kg/m  Wt Readings from Last 3 Encounters:  12/30/20 229 lb (103.9 kg)  10/21/20 232 lb 12.8 oz (105.6 kg)  10/21/20 233 lb 6.4 oz (105.9 kg)       Assessment & Plan:   Problem List Items Addressed This Visit       Unprioritized   Vitamin D deficiency   Relevant Orders   VITAMIN D 25 Hydroxy (Vit-D Deficiency, Fractures)   Iron deficiency anemia - Primary  Relevant Orders   CBC with Differential/Platelet   Iron   Ferritin   HTN (hypertension)   Relevant Orders   Basic metabolic panel   Essential hypertension, benign    BP at goal. Continue amlodipien 10mg  and toprol x 50mg .        DOE (dyspnea on exertion)    ? If related to anemia.  Will arrange follow up with cardiology. She is advised to go to the ER if chest pain, worsening SOB.       Relevant Orders   Ambulatory referral to Cardiology   Borderline type 2 diabetes mellitus    Lab Results  Component Value Date   HGBA1C 6.6 (H) 09/23/2020         Relevant Orders   Hemoglobin A1c   Allergic rhinitis    Fair control on claritin 10mg  continue same.          No orders of the defined types were  placed in this encounter.   I, [name], personally preformed the services described in this documentation.  All medical record entries made by the scribe were at my direction and in my presence.  I have reviewed the chart and discharge instructions (if applicable) and agree that the record reflects my personal performance and is accurate and complete. 12/30/2020   I,Shehryar Baig,acting as a scribe for [name], NP.,have documented all relevant documentation on the behalf of [name], NP,as directed by  [name], NP while in the presence of [name], NP.   [name], NP

## 2021-01-04 ENCOUNTER — Telehealth: Payer: Self-pay | Admitting: Family

## 2021-01-04 DIAGNOSIS — R7989 Other specified abnormal findings of blood chemistry: Secondary | ICD-10-CM

## 2021-01-04 DIAGNOSIS — E559 Vitamin D deficiency, unspecified: Secondary | ICD-10-CM

## 2021-01-04 MED ORDER — VITAMIN D (ERGOCALCIFEROL) 1.25 MG (50000 UNIT) PO CAPS: 50000.0000 [IU] | ORAL_CAPSULE | ORAL | 0 refills | Status: DC

## 2021-01-04 NOTE — Telephone Encounter (Signed)
Spoke with patient. Lab appt made. Pt states she was receiving iron infusions approx sometime last year with Dr. Marin Olp

## 2021-01-04 NOTE — Telephone Encounter (Signed)
Called pt and lvm to return call.  

## 2021-01-04 NOTE — Telephone Encounter (Signed)
Vit d is low. Start 50000 iu vit D weekly x 12 weeks, then repeat vit D level in 3 months.   Is she taking any iron supplement? I would like her to complete a test to look for cause of her elevated ferritin level. (Order placed).  Sugar is at goal.

## 2021-01-06 ENCOUNTER — Other Ambulatory Visit: Payer: Self-pay

## 2021-01-06 ENCOUNTER — Other Ambulatory Visit (INDEPENDENT_AMBULATORY_CARE_PROVIDER_SITE_OTHER): Payer: Medicare Other

## 2021-01-06 DIAGNOSIS — E559 Vitamin D deficiency, unspecified: Secondary | ICD-10-CM

## 2021-01-06 DIAGNOSIS — R7989 Other specified abnormal findings of blood chemistry: Secondary | ICD-10-CM

## 2021-01-06 NOTE — Addendum Note (Signed)
Addended by: Kelle Darting A on: 01/06/2021 10:01 AM   Modules accepted: Orders

## 2021-01-11 LAB — HEMOCHROMATOSIS DNA-PCR(C282Y,H63D)

## 2021-01-12 ENCOUNTER — Encounter: Payer: Self-pay | Admitting: Family

## 2021-01-16 ENCOUNTER — Telehealth: Payer: Self-pay

## 2021-01-16 NOTE — Telephone Encounter (Signed)
Pt called stating she tested pos for covid Friday. Sxs started Friday Sxs include: Runny nose, dry cough Tx tried: Herbie Drape, Nyquil.  CB number: [phone]

## 2021-01-16 NOTE — Telephone Encounter (Signed)
Patient scheduled with Mackie Pai, PA on 01/17/2021. Day 5.

## 2021-01-17 ENCOUNTER — Telehealth (INDEPENDENT_AMBULATORY_CARE_PROVIDER_SITE_OTHER): Payer: Medicare Other | Admitting: Medical

## 2021-01-17 ENCOUNTER — Other Ambulatory Visit: Payer: Self-pay

## 2021-01-17 DIAGNOSIS — U071 COVID-19: Secondary | ICD-10-CM | POA: Diagnosis not present

## 2021-01-17 MED ORDER — MOLNUPIRAVIR 200 MG PO CAPS: 4.0000 | ORAL_CAPSULE | Freq: Two times a day (BID) | ORAL | 0 refills | Status: AC

## 2021-01-17 MED ORDER — BENZONATATE 100 MG PO CAPS: 100.0000 mg | ORAL_CAPSULE | Freq: Three times a day (TID) | ORAL | 0 refills | Status: DC | PRN

## 2021-01-17 MED ORDER — FLUTICASONE PROPIONATE 50 MCG/ACT NA SUSP: 2.0000 | Freq: Every day | NASAL | 1 refills | Status: AC

## 2021-01-17 NOTE — Progress Notes (Signed)
   Subjective:    Patient ID: [name], female    DOB: 10/06/1960, 60 y.o.   MRN: [id number]  HPI  Virtual Visit via Telephone Note  I connected with [name] on 01/17/21 at 10:20 AM EDT by telephone and verified that I am speaking with the correct person using two identifiers.  Location: Patient: home Provider: office.     I discussed the limitations, risks, security and privacy concerns of performing an evaluation and management service by telephone and the availability of in person appointments. I also discussed with the patient that there may be a patient responsible charge related to this service. The patient expressed understanding and agreed to proceed.   History of Present Illness:  Pt states she tested + on Saturday. Pt has only mild nasal congestion and runny nose. Faint dry cough. No fever, no chills, no body aches, no body aches and no fatigue. Pt basically states feels. No sob and now wheezing.  Pt covid risk score 5.Pt is diabetic and obese.  Pt did not check bp today.  Pt does not have 02 sat monitor.  Pt has been vaccinated. 2 shot covid vaccine series and booster September 07, 2019.       Observations/Objective:  General-no acute distress, pleasant, oriented. Normal speech. Not labored.      Assessment and Plan: Early in covid infection with risk score 5 but very mild symptoms. Based on age and risk score discussed benefit vs risk of molnupiravir. Explained EAU designation. Mutual decision made in light of age and risk.   Can use flonase nasal spray for nasal congestion.  Benzonatate for cough.  Continue vit d and can use zing 50 mg 1 tab daily.  Get 02 sat to check o2% daily. Educated on good readings.   If signs symptoms worsen or change notify us.  Follow up 7 days or sooner if needed  Mackie Pai, PA-C   Follow Up Instructions:    I discussed the assessment and treatment plan with the patient. The patient was provided an  opportunity to ask questions and all were answered. The patient agreed with the plan and demonstrated an understanding of the instructions.   The patient was advised to call back or seek an in-person evaluation if the symptoms worsen or if the condition fails to improve as anticipated.  Time spent with patient today was  31 minutes which consisted of chart review, discussing diagnosis,treatment and documentation.   Mackie Pai, PA-C   Review of Systems  Constitutional:  Negative for chills, fatigue and fever.  HENT:  Positive for congestion and rhinorrhea.   Respiratory:  Positive for cough. Negative for shortness of breath and wheezing.   Cardiovascular:  Negative for chest pain and palpitations.  Gastrointestinal:  Negative for abdominal pain.  Musculoskeletal:  Negative for back pain and myalgias.  Neurological:  Negative for dizziness and headaches.  Hematological:  Negative for adenopathy.      Objective:   Physical Exam        Assessment & Plan:

## 2021-01-17 NOTE — Patient Instructions (Addendum)
Early in covid infection with risk score 5 but very mild symptoms. Based on age and risk score discussed benefit vs risk of molnupiravir. Explained EAU designation. Mutual decision made in light of age and risk.   Can use flonase nasal spray for nasal congestion.  Benzonatate for cough.  Continue vit d and can use zing 50 mg 1 tab daily.  Get 02 sat to check o2% daily. Educated on good readings.   If signs symptoms worsen or change notify us.  Follow up 7 days or sooner if needed

## 2021-01-18 NOTE — Progress Notes (Signed)
Mailed out to patient 

## 2021-01-26 ENCOUNTER — Encounter: Payer: Self-pay | Admitting: Physician Assistant

## 2021-01-26 ENCOUNTER — Encounter: Payer: Self-pay | Admitting: Family

## 2021-01-26 ENCOUNTER — Other Ambulatory Visit: Payer: Self-pay

## 2021-01-26 ENCOUNTER — Ambulatory Visit (INDEPENDENT_AMBULATORY_CARE_PROVIDER_SITE_OTHER): Payer: BC Managed Care – PPO | Admitting: Physician Assistant

## 2021-01-26 DIAGNOSIS — L720 Epidermal cyst: Secondary | ICD-10-CM

## 2021-01-26 DIAGNOSIS — L820 Inflamed seborrheic keratosis: Secondary | ICD-10-CM | POA: Diagnosis not present

## 2021-01-26 DIAGNOSIS — Z1283 Encounter for screening for malignant neoplasm of skin: Secondary | ICD-10-CM

## 2021-01-26 DIAGNOSIS — D485 Neoplasm of uncertain behavior of skin: Secondary | ICD-10-CM

## 2021-01-26 DIAGNOSIS — D2339 Other benign neoplasm of skin of other parts of face: Secondary | ICD-10-CM

## 2021-01-26 DIAGNOSIS — L304 Erythema intertrigo: Secondary | ICD-10-CM

## 2021-01-26 MED ORDER — KETOCONAZOLE 2 % EX CREA: 1.0000 "application " | TOPICAL_CREAM | Freq: Two times a day (BID) | CUTANEOUS | 10 refills | Status: AC

## 2021-01-26 MED ORDER — CLOBETASOL PROPIONATE 0.05 % EX SOLN: 1.0000 "application " | Freq: Two times a day (BID) | CUTANEOUS | 6 refills | Status: AC

## 2021-01-26 MED ORDER — ALCLOMETASONE DIPROPIONATE 0.05 % EX CREA: TOPICAL_CREAM | Freq: Two times a day (BID) | CUTANEOUS | 3 refills | Status: AC | PRN

## 2021-01-26 NOTE — Patient Instructions (Signed)

## 2021-02-01 ENCOUNTER — Telehealth: Payer: Self-pay | Admitting: Family

## 2021-02-01 ENCOUNTER — Encounter: Payer: Self-pay | Admitting: Physician Assistant

## 2021-02-01 NOTE — Progress Notes (Signed)
New Patient   Subjective  [name] is a 60 y.o. female who presents for the following: New Patient (Initial Visit) (Patient here today for bumps that come up inside of her ears and on the back of her ear x 1 year. Per patient itching but no pain, per patient she does notice that they get dark and she puts oil on them so they don't get dry.  Her posterior scalp has been irritated and itching as well. Patient also has a lesion on her right cheek and left cheek x years no bleeding, patient is able to get drainage out of the lesion on her left cheek. Patient would like lesions next to her eyes checked per patient the lesions have been removed but they came back. ).She has irritation in the folds of her stomach and under her breasts that have been flaring this summer. It gets really bad when she sweats.   The following portions of the chart were reviewed this encounter and updated as appropriate:  Tobacco  Allergies  Meds  Problems  Med Hx  Surg Hx  Fam Hx      Objective  Well appearing patient in no apparent distress; mood and affect are within normal limits.  A full examination was performed including scalp, head, eyes, ears, nose, lips, neck, chest, axillae, abdomen, back, buttocks, bilateral upper extremities, bilateral lower extremities, hands, feet, fingers, toes, fingernails, and toenails. All findings within normal limits unless otherwise noted below.  Right Buccal Cheek Dark plaque       RIght Outer Canthus Dark plaque     Left outher Canthus Dark plaque     Chest (Upper Torso, Anterior), Head, Left Arm, Right Arm, Right Lower Leg - Anterior Erythema with hyperpigmentation and slight scale           Mid Occipital Scalp Confluent thick dark crusty plaques and papules  Assessment & Plan  Neoplasm of uncertain behavior of skin (3) Right Buccal Cheek  Skin / nail biopsy Type of biopsy: tangential   Informed consent: discussed and consent  obtained   Timeout: patient name, date of birth, surgical site, and procedure verified   Procedure prep:  Patient was prepped and draped in usual sterile fashion (Non sterile) Prep type:  Chlorhexidine Anesthesia: the lesion was anesthetized in a standard fashion   Anesthetic:  1% lidocaine w/ epinephrine 1-100,000 local infiltration Instrument used: flexible razor blade   Outcome: patient tolerated procedure well   Post-procedure details: wound care instructions given    Specimen 1 - Surgical pathology Differential Diagnosis: hidrocystoma  Check Margins: No  RIght Outer Canthus  Skin / nail biopsy Type of biopsy: tangential   Informed consent: discussed and consent obtained   Timeout: patient name, date of birth, surgical site, and procedure verified   Procedure prep:  Patient was prepped and draped in usual sterile fashion (Non sterile) Prep type:  Chlorhexidine Anesthesia: the lesion was anesthetized in a standard fashion   Anesthetic:  1% lidocaine w/ epinephrine 1-100,000 local infiltration Instrument used: flexible razor blade   Outcome: patient tolerated procedure well   Post-procedure details: wound care instructions given    Specimen 2 - Surgical pathology Differential Diagnosis: hidrocystoma    Check Margins: No  Left outher Canthus  Skin / nail biopsy Type of biopsy: tangential   Informed consent: discussed and consent obtained   Timeout: patient name, date of birth, surgical site, and procedure verified   Procedure prep:  Patient was prepped and draped  in usual sterile fashion (Non sterile) Prep type:  Chlorhexidine Anesthesia: the lesion was anesthetized in a standard fashion   Anesthetic:  1% lidocaine w/ epinephrine 1-100,000 local infiltration Instrument used: flexible razor blade   Outcome: patient tolerated procedure well   Post-procedure details: wound care instructions given    Specimen 3 - Surgical pathology Differential Diagnosis:  hidrocystoma    Check Margins: No  Erythema intertrigo Right Lower Leg - Anterior; Chest (Upper Torso, Anterior); Head; Left Arm; Right Arm  alclomethasone (ACLOVATE) 0.05 % cream - Chest (Upper Torso, Anterior), Head, Left Arm, Right Arm, Right Lower Leg - Anterior Apply topically 2 (two) times daily as needed (Rash).  ketoconazole (NIZORAL) 2 % cream - Chest (Upper Torso, Anterior), Head, Left Arm, Right Arm, Right Lower Leg - Anterior Apply 1 application topically in the morning and at bedtime.  Seborrheic keratosis, inflamed Mid Occipital Scalp  clobetasol (TEMOVATE) 0.05 % external solution - Mid Occipital Scalp Apply 1 application topically 2 (two) times daily.    I, Marche Hottenstein, PA-C, have reviewed all documentation's for this visit.  The documentation on 02/01/21 for the exam, diagnosis, procedures and orders are all accurate and complete.

## 2021-02-01 NOTE — Telephone Encounter (Signed)
Patient requesting a muscle relaxer or a pain reliever for her back pain she is having  Please advise

## 2021-02-02 NOTE — Telephone Encounter (Signed)
Called back to get more details on symptoms, no answer. Lvm for patient to call back.

## 2021-02-02 NOTE — Telephone Encounter (Signed)
Starting having lower back pain and spasms yesterday, patient reports se has had medication for this before.

## 2021-02-03 MED ORDER — CYCLOBENZAPRINE HCL 5 MG PO TABS: 5.0000 mg | ORAL_TABLET | Freq: Three times a day (TID) | ORAL | 0 refills | Status: DC | PRN

## 2021-02-03 NOTE — Telephone Encounter (Signed)
Patient advised of new prescription 

## 2021-02-03 NOTE — Telephone Encounter (Signed)
LVM FOR PATIENT TO CALL BACK.

## 2021-02-03 NOTE — Telephone Encounter (Signed)
Rx sent for flexeril.

## 2021-02-03 NOTE — Addendum Note (Signed)
Addended by: [name] on: 02/03/2021 07:52 AM   Modules accepted: Orders

## 2021-02-15 ENCOUNTER — Other Ambulatory Visit: Payer: Self-pay | Admitting: Family

## 2021-02-15 ENCOUNTER — Other Ambulatory Visit: Payer: Self-pay | Admitting: Internal Medicine

## 2021-02-20 ENCOUNTER — Telehealth: Payer: Self-pay | Admitting: Family

## 2021-02-20 NOTE — Telephone Encounter (Signed)
Pt. Called in stating that she is still experiencing back pain and wanted to see if there are other options

## 2021-02-20 NOTE — Telephone Encounter (Signed)
Patient was scheduled to be seen tomorrow

## 2021-02-21 ENCOUNTER — Ambulatory Visit (INDEPENDENT_AMBULATORY_CARE_PROVIDER_SITE_OTHER): Payer: Medicare Other | Admitting: Family

## 2021-02-21 ENCOUNTER — Other Ambulatory Visit: Payer: Self-pay

## 2021-02-21 VITALS — BP 140/90 | HR 62 | Temp 98.0°F | Ht 65.0 in | Wt 226.8 lb

## 2021-02-21 DIAGNOSIS — M545 Low back pain, unspecified: Secondary | ICD-10-CM | POA: Diagnosis not present

## 2021-02-21 DIAGNOSIS — E559 Vitamin D deficiency, unspecified: Secondary | ICD-10-CM

## 2021-02-21 DIAGNOSIS — M2550 Pain in unspecified joint: Secondary | ICD-10-CM

## 2021-02-21 MED ORDER — METHYLPREDNISOLONE 4 MG PO TBPK: ORAL_TABLET | ORAL | 0 refills | Status: DC

## 2021-02-21 NOTE — Patient Instructions (Signed)
Please begin medrol dose pak (steroid) for your back pain. Continue your vitamin D supplement.

## 2021-02-21 NOTE — Assessment & Plan Note (Signed)
Worsened. Will rx with medrol dose pak and refer back to Neurosurgery.

## 2021-02-21 NOTE — Assessment & Plan Note (Signed)
Will see how she does with medrol dose pak for now.

## 2021-02-21 NOTE — Assessment & Plan Note (Signed)
Continue weekly vit D 50000 for a total of 3 months then plan to recheck level next visit.

## 2021-02-21 NOTE — Progress Notes (Signed)
Subjective:     Patient ID: [name], female    DOB: 01-Mar-1961, 60 y.o.   MRN: [id number]  Chief Complaint  Patient presents with   lower back pain    Got worse Friday, very stiff.    achey joint     Noticed this stiffness and ache joint started last week after taking vit D once weekly,      HPI  Patient is in today to discuss joint pain and worsening low back pain.  She notes that her back pain worsened on Friday 02/17/21. She reports pain is in the right lower back.  Back hurts with breathing, moving, walking.  She has previously seen neurosurgery and had ESI's for her back pain. She has not seen them in some time.   She feels that her joint pain worsened after she began vit D supplement. Notes pain in arms/legs including some cramping in her LE. Had negative ANA/Rheumatoid factor previously.  Health Maintenance Due  Topic Date Due   COVID-19 Vaccine (4 - Booster for Pfizer series) 08/20/2020   INFLUENZA VACCINE  01/23/2021   URINE MICROALBUMIN  02/07/2021    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/08- benign   Iron deficiency anemia    Migraine headache    Non-compliance    Peptic ulcer disease    Thyroid disease     Past Surgical History:  Procedure Laterality Date   ABDOMINAL EXPLORATION SURGERY     w/bso    BREAST BIOPSY     CARDIAC CATHETERIZATION  2009   mild non obstructive CAD   CHOLECYSTECTOMY     COLONOSCOPY     ESOPHAGEAL MANOMETRY N/A 08/01/2020   Procedure: ESOPHAGEAL MANOMETRY (EM);  Surgeon:  Yetta Flock, MD;  Location: [facility];  Service: Gastroenterology;  Laterality: N/A;   KNEE SURGERY  2005    left knee   LEFT HEART CATH AND CORONARY ANGIOGRAPHY N/A 07/17/2017   Procedure: LEFT HEART CATH AND CORONARY ANGIOGRAPHY;  Surgeon: Martinique, Peter M, MD;  Location: [facility];  Service: Cardiovascular;  Laterality: N/A;   POLYPECTOMY     TOTAL ABDOMINAL HYSTERECTOMY     UPPER GASTROINTESTINAL ENDOSCOPY      Family History  Problem Relation Age of Onset   Hypertension Mother    Rheum arthritis Mother    Hypertension Father    Diabetes Father    Prostate cancer Father    Kidney disease Father    Diabetes Sister    Fibromyalgia Sister    Diabetes Maternal Grandmother    Lung cancer Maternal Grandfather    Arthritis Brother    Migraines Brother    CAD Brother    Fibromyalgia Sister    Migraines Sister    Colon cancer Neg Hx    Thyroid disease Neg Hx    Colon polyps Neg Hx     Social History   Socioeconomic History  Marital status: Widowed    Spouse name: Not on file   Number of children: 2   Years of education: Not on file   Highest education level: Not on file  Occupational History   Occupation: DISABILITY    Employer: UNEMPLOYED  Tobacco Use   Smoking status: Former    Packs/day: 0.50    Years: 18.00    Pack years: 9.00    Types: Cigarettes    Start date: 07/29/1977    Quit date: 06/25/1994    Years since quitting: 26.6   Smokeless tobacco: Never   Tobacco comments:    quit 19 years ago  Vaping Use   Vaping Use: Never used  Substance and Sexual Activity   Alcohol use: Yes    Alcohol/week: 0.0 standard drinks    Comment: social drinker   Drug use: No   Sexual activity: Not Currently  Other Topics Concern   Not on file  Social History Narrative   Widowed   Has 2 grown children.  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    Former Smoker Quit tobacco in 1996.  She was a pack a day smoker  for approximately 10 years.    Alcohol use-yes: Social    Daily Caffeine Use:6 pack of pepsi daily     Illicit Drug Use - no    Patient does not get regular exercise.       Smoking Status:  quit   Social Determinants of Health   Financial Resource Strain: Not on file  Food Insecurity: Not on file  Transportation Needs: Not on file  Physical Activity: Not on file  Stress: Not on file  Social Connections: Not on file  Intimate Partner Violence: Not on file    Outpatient Medications Prior to Visit  Medication Sig Dispense Refill   Albuterol Sulfate 108 (90 Base) MCG/ACT AEPB Inhale 2 puffs into the lungs 4 (four) times daily. 1 each 3   alclomethasone (ACLOVATE) 0.05 % cream Apply topically 2 (two) times daily as needed (Rash). 180 g 3   Alirocumab (PRALUENT) 150 MG/ML SOAJ Inject 1 pen into the skin every 14 (fourteen) days. 2 mL 11   amLODipine (NORVASC) 10 MG tablet Take 1 tablet by mouth once daily 90 tablet 1   aspirin 81 MG chewable tablet Chew 1 tablet (81 mg total) by mouth daily.     clobetasol (TEMOVATE) 0.05 % external solution Apply 1 application topically 2 (two) times daily. 50 mL 6   cyclobenzaprine (FLEXERIL) 5 MG tablet Take 1 tablet (5 mg total) by mouth 3 (three) times daily as needed for muscle spasms. 30 tablet 0   isosorbide mononitrate (IMDUR) 60 MG 24 hr tablet Take 1 tablet (60 mg total) by mouth daily. 90 tablet 3   ketoconazole (NIZORAL) 2 % cream Apply 1 application topically in the morning and at bedtime. 15 g 10   loratadine (CLARITIN) 10 MG tablet Take 1 tablet (10 mg total) by mouth daily. 10 tablet 0   methimazole (TAPAZOLE) 5 MG tablet Take 1 tablet by mouth once daily 90 tablet 3   metoprolol succinate (TOPROL-XL) 50 MG 24 hr tablet Take 1 tablet (50 mg total) by mouth daily. Take with or immediately following a meal. 90 tablet 3   ondansetron (ZOFRAN) 4 MG tablet Take 1 tab by mouth every 6 hours as needed for nausea. 100 tablet 1   potassium chloride  SA (KLOR-CON) 20 MEQ tablet Take 1  tablet (20 mEq total) by mouth daily. 90 tablet 3   rosuvastatin (CRESTOR) 20 MG tablet Take 1 tablet by mouth once daily 90 tablet 1   SUMAtriptan (IMITREX) 50 MG tablet Take 1 tablet (50 mg total) by mouth every 2 (two) hours as needed for migraine. May repeat in 2 hours if headache persists or recurs. 10 tablet 5   Vitamin D, Ergocalciferol, (DRISDOL) 1.25 MG (50000 UNIT) CAPS capsule Take 1 capsule (50,000 Units total) by mouth every 7 (seven) days. 12 capsule 0   fluticasone (FLONASE) 50 MCG/ACT nasal spray Place 2 sprays into both nostrils daily. (Patient not taking: Reported on 02/21/2021) 16 g 1   imipramine (TOFRANIL) 25 MG tablet Take 1 tablet (25 mg total) by mouth at bedtime. 30 tablet 3   nitroGLYCERIN (NITROSTAT) 0.4 MG SL tablet Place 1 tablet (0.4 mg total) under the tongue every 5 (five) minutes as needed for chest pain. (Patient not taking: Reported on 02/21/2021) 25 tablet 3   nystatin (MYCOSTATIN/NYSTOP) powder Apply 1 application topically 2 (two) times daily. Beneath both breasts (Patient not taking: Reported on 02/21/2021) 60 g 1   benzonatate (TESSALON) 100 MG capsule Take 1 capsule (100 mg total) by mouth 3 (three) times daily as needed for cough. 30 capsule 0   No facility-administered medications prior to visit.    Allergies  Allergen Reactions   Ace Inhibitors     REACTION: chronic cough   Celebrex [Celecoxib] Other (See Comments)    "makes me bleed"   Diovan [Valsartan]     angioedema    ROS     Objective:    Physical Exam Constitutional:      Appearance: She is well-developed.  Cardiovascular:     Rate and Rhythm: Normal rate and regular rhythm.     Heart sounds: Normal heart sounds. No murmur heard. Pulmonary:     Effort: Pulmonary effort is normal. No respiratory distress.     Breath sounds: Normal breath sounds. No wheezing.  Musculoskeletal:     Comments: + tenderness to palpation overlying lower lumbar spine  No  joint swelling appreciated  Psychiatric:        Behavior: Behavior normal.        Thought Content: Thought content normal.        Judgment: Judgment normal.    BP 140/90   Pulse 62   Temp 98 F (36.7 C) (Oral)   Ht '5\' 5"'$  (1.651 m)   Wt 226 lb 12.8 oz (102.9 kg)   SpO2 99%   BMI 37.74 kg/m  Wt Readings from Last 3 Encounters:  02/21/21 226 lb 12.8 oz (102.9 kg)  12/30/20 229 lb (103.9 kg)  10/21/20 232 lb 12.8 oz (105.6 kg)       Assessment & Plan:   Problem List Items Addressed This Visit       Unprioritized   Vitamin D deficiency    Continue weekly vit D 50000 for a total of 3 months then plan to recheck level next visit.       Polyarthralgia    Will see how she does with medrol dose pak for now.        Low back pain - Primary    Worsened. Will rx with medrol dose pak and refer back to Neurosurgery.       Relevant Medications   methylPREDNISolone (MEDROL DOSEPAK) 4 MG TBPK tablet   Other Relevant Orders   Ambulatory referral to Neurosurgery    I have discontinued  Riki Sheer Wehner's benzonatate. I am also having her start on methylPREDNISolone. Additionally, I am having her maintain her aspirin, Albuterol Sulfate, metoprolol succinate, Praluent, isosorbide mononitrate, nitroGLYCERIN, potassium chloride SA, nystatin, ondansetron, SUMAtriptan, methimazole, loratadine, imipramine, Vitamin D (Ergocalciferol), fluticasone, alclomethasone, ketoconazole, clobetasol, cyclobenzaprine, rosuvastatin, and amLODipine.  Meds ordered this encounter  Medications   methylPREDNISolone (MEDROL DOSEPAK) 4 MG TBPK tablet    Sig: Take per package instructions    Dispense:  21 tablet    Refill:  0    Order Specific Question:   Supervising Provider    Answer:   Penni Homans A [id number]

## 2021-02-21 NOTE — Assessment & Plan Note (Signed)
>>  ASSESSMENT AND PLAN FOR LOW BACK PAIN WRITTEN ON 02/21/2021  7:52 AM BY O'SULLIVAN, Arlander Gillen, NP  Worsened. Will rx with medrol  dose pak and refer back to Neurosurgery.

## 2021-03-31 ENCOUNTER — Ambulatory Visit: Payer: Medicaid Other | Admitting: Family

## 2021-04-07 DIAGNOSIS — M5416 Radiculopathy, lumbar region: Secondary | ICD-10-CM | POA: Diagnosis not present

## 2021-04-07 DIAGNOSIS — I10 Essential (primary) hypertension: Secondary | ICD-10-CM | POA: Diagnosis not present

## 2021-04-18 DIAGNOSIS — M5116 Intervertebral disc disorders with radiculopathy, lumbar region: Secondary | ICD-10-CM | POA: Diagnosis not present

## 2021-04-18 DIAGNOSIS — M5416 Radiculopathy, lumbar region: Secondary | ICD-10-CM | POA: Diagnosis not present

## 2021-04-19 ENCOUNTER — Telehealth: Payer: Self-pay | Admitting: Family

## 2021-04-19 NOTE — Telephone Encounter (Signed)
Lvm for patient to be aware we need to check her Vitamin D.

## 2021-04-19 NOTE — Telephone Encounter (Signed)
Pt received telephone call informing her to have labs for A1C, no active labs. Please advise.

## 2021-04-21 ENCOUNTER — Other Ambulatory Visit (INDEPENDENT_AMBULATORY_CARE_PROVIDER_SITE_OTHER): Payer: Medicare Other

## 2021-04-21 ENCOUNTER — Other Ambulatory Visit: Payer: Self-pay

## 2021-04-21 DIAGNOSIS — E559 Vitamin D deficiency, unspecified: Secondary | ICD-10-CM

## 2021-04-21 LAB — VITAMIN D 25 HYDROXY (VIT D DEFICIENCY, FRACTURES): VITD: 25.44 ng/mL — ABNORMAL LOW (ref 30.00–100.00)

## 2021-04-23 ENCOUNTER — Telehealth: Payer: Self-pay | Admitting: Family

## 2021-04-23 MED ORDER — VITAMIN D (ERGOCALCIFEROL) 1.25 MG (50000 UNIT) PO CAPS
50000.0000 [IU] | ORAL_CAPSULE | ORAL | 0 refills | Status: DC
Start: 2021-04-23 — End: 2021-12-01

## 2021-04-23 NOTE — Telephone Encounter (Signed)
Vit D is still low, but improving. Continue 50000 iu once weekly of vit D and then repeat in vit D level in 6 weeks, dx vit D deficiency.

## 2021-04-25 ENCOUNTER — Encounter: Payer: Self-pay | Admitting: Family

## 2021-04-25 NOTE — Telephone Encounter (Signed)
Patient advised of results and provider's advise. She will follow up on her scheduled December appointment

## 2021-04-28 ENCOUNTER — Ambulatory Visit: Payer: Medicare Other | Admitting: Endocrinology

## 2021-05-01 ENCOUNTER — Other Ambulatory Visit: Payer: Self-pay

## 2021-05-01 ENCOUNTER — Ambulatory Visit (INDEPENDENT_AMBULATORY_CARE_PROVIDER_SITE_OTHER): Payer: Medicare Other | Admitting: Family Medicine

## 2021-05-01 ENCOUNTER — Encounter: Payer: Self-pay | Admitting: Family Medicine

## 2021-05-01 VITALS — BP 144/82 | HR 93 | Temp 98.6°F | Ht 65.0 in | Wt 223.0 lb

## 2021-05-01 DIAGNOSIS — L304 Erythema intertrigo: Secondary | ICD-10-CM

## 2021-05-01 MED ORDER — CLOTRIMAZOLE-BETAMETHASONE 1-0.05 % EX CREA: 1.0000 "application " | TOPICAL_CREAM | Freq: Two times a day (BID) | CUTANEOUS | 0 refills | Status: DC

## 2021-05-01 MED ORDER — METHYLPREDNISOLONE ACETATE 80 MG/ML IJ SUSP
80.0000 mg | Freq: Once | INTRAMUSCULAR | Status: AC
  Administered 2021-05-01: 80 mg via INTRAMUSCULAR

## 2021-05-01 NOTE — Progress Notes (Signed)
Chief Complaint  Patient presents with   Rash    [name] is a 60 y.o. female here for a skin complaint.  Duration: 2 days Location: groin region Pruritic? Yes Painful? Yes Drainage? No New soaps/lotions/topicals/detergents? Yes - sensitive skinned soap Sick contacts? 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 Hyperpig in inguinal region b/l and under panniculus.  No drainage, erythema, TTP, fluctuance, excoriation Psych: Age appropriate judgment and insight  Intertrigo - Plan: methylPREDNISolone acetate (DEPO-MEDROL) injection 80 mg  Reports misery, Depo injection. Will tx w anti-fungal for underlying issue and steroid for symptom control.  F/u prn. The patient voiced understanding and agreement to the plan.  Winslow West, DO 05/01/21 12:21 PM

## 2021-05-01 NOTE — Patient Instructions (Signed)
Use the cream twice daily. Consider powder in that area to keep it dry.  Let me know if things aren't getting better.  Let us know if you need anything.

## 2021-05-03 ENCOUNTER — Telehealth: Payer: Self-pay | Admitting: Family

## 2021-05-03 MED ORDER — FLUCONAZOLE 150 MG PO TABS: ORAL_TABLET | ORAL | 0 refills | Status: DC

## 2021-05-03 NOTE — Telephone Encounter (Signed)
In place of the lotrisone cream, will send rx for diflucan 150 once weekly for 3 weeks.

## 2021-05-03 NOTE — Telephone Encounter (Signed)
Patient advised of new prescription 

## 2021-05-03 NOTE — Telephone Encounter (Signed)
Patient states that the cream given to her on her 11/07 OV is not helping her and that ist's burning when she goes to the bathroom. She would like to know if there is something different she can try. Please advise.

## 2021-05-05 ENCOUNTER — Other Ambulatory Visit: Payer: Self-pay

## 2021-05-05 ENCOUNTER — Ambulatory Visit (INDEPENDENT_AMBULATORY_CARE_PROVIDER_SITE_OTHER): Payer: Medicare Other | Admitting: Endocrinology

## 2021-05-05 VITALS — BP 160/80 | HR 76 | Ht 65.0 in | Wt 225.4 lb

## 2021-05-05 DIAGNOSIS — E0590 Thyrotoxicosis, unspecified without thyrotoxic crisis or storm: Secondary | ICD-10-CM | POA: Diagnosis not present

## 2021-05-05 LAB — T4, FREE: Free T4: 1.03 ng/dL (ref 0.60–1.60)

## 2021-05-05 LAB — TSH: TSH: 1.56 u[IU]/mL (ref 0.35–5.50)

## 2021-05-05 NOTE — Patient Instructions (Addendum)
Your blood pressure is high today.  Please see your primary care provider soon, to have it rechecked.   Blood tests are requested for you today.  We'll let you know about the results.  If ever you have fever while taking methimazole, stop it and call us, even if the reason is obvious, because of the risk of a rare side-effect.   It is best to never miss the medication.  However, if you do miss it, next best is to double up the next time.   Please come back for a follow-up appointment in 6 months.

## 2021-05-05 NOTE — Progress Notes (Signed)
Subjective:    Patient ID: [name], female    DOB: Mar 27, 1961, 60 y.o.   MRN: [id number]  HPI Pt returns for f/u of hyperthyroidism (dx'ed 2012; she chose tapazole rx; she has never had thyroid imaging).  pt states she feels well in general.  Specifically, she denies palpitations and tremor.  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/08- benign   Iron deficiency anemia    Migraine headache    Non-compliance    Peptic ulcer disease    Thyroid disease     Past Surgical History:  Procedure Laterality Date   ABDOMINAL EXPLORATION SURGERY     w/bso    BREAST BIOPSY     CARDIAC CATHETERIZATION  2009   mild non obstructive CAD   CHOLECYSTECTOMY     COLONOSCOPY     ESOPHAGEAL MANOMETRY N/A 08/01/2020   Procedure: ESOPHAGEAL MANOMETRY (EM);  Surgeon: Yetta Flock, MD;  Location: [facility];  Service: Gastroenterology;  Laterality: N/A;   KNEE SURGERY  2005    left knee   LEFT HEART CATH AND CORONARY ANGIOGRAPHY N/A 07/17/2017   Procedure: LEFT HEART CATH AND CORONARY ANGIOGRAPHY;  Surgeon: Martinique, Peter M, MD;  Location: [facility];  Service: Cardiovascular;  Laterality: N/A;   POLYPECTOMY     TOTAL ABDOMINAL HYSTERECTOMY     UPPER GASTROINTESTINAL ENDOSCOPY      Social History   Socioeconomic History   Marital status: Widowed    Spouse name: Not on file   Number of children: 2   Years of education: Not on file   Highest education level: Not on file  Occupational History   Occupation: DISABILITY     Employer: UNEMPLOYED  Tobacco Use   Smoking status: Former    Packs/day: 0.50    Years: 18.00    Pack years: 9.00    Types: Cigarettes    Start date: 07/29/1977    Quit date: 06/25/1994    Years since quitting: 26.8   Smokeless tobacco: Never   Tobacco comments:    quit 19 years ago  Vaping Use   Vaping Use: Never used  Substance and Sexual Activity   Alcohol use: Yes    Alcohol/week: 0.0 standard drinks    Comment: social drinker   Drug use: No   Sexual activity: Not Currently  Other Topics Concern   Not on file  Social History Narrative   Widowed   Has 2 grown children.  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    Former Smoker Quit tobacco in 1996.  She was a pack a day smoker for approximately 10 years.    Alcohol use-yes: Social    Daily Caffeine Use:6 pack of pepsi daily     Illicit Drug Use - no    Patient does not get regular exercise.       Smoking Status:  quit  Social Determinants of Health   Financial Resource Strain: Not on file  Food Insecurity: Not on file  Transportation Needs: Not on file  Physical Activity: Not on file  Stress: Not on file  Social Connections: Not on file  Intimate Partner Violence: Not on file    Current Outpatient Medications on File Prior to Visit  Medication Sig Dispense Refill   Albuterol Sulfate 108 (90 Base) MCG/ACT AEPB Inhale 2 puffs into the lungs 4 (four) times daily. 1 each 3   alclomethasone (ACLOVATE) 0.05 % cream Apply topically 2 (two) times daily as needed (Rash). 180 g 3   Alirocumab (PRALUENT) 150 MG/ML SOAJ Inject 1 pen into the skin every 14 (fourteen) days. 2 mL 11   amLODipine (NORVASC) 10 MG tablet Take 1 tablet by mouth once daily 90 tablet 1   aspirin 81 MG chewable tablet Chew 1 tablet (81 mg total) by mouth daily.     clobetasol (TEMOVATE) 0.05 % external solution Apply 1 application topically 2 (two) times daily. 50 mL 6   clotrimazole-betamethasone (LOTRISONE)  cream Apply 1 application topically 2 (two) times daily. 30 g 0   cyclobenzaprine (FLEXERIL) 5 MG tablet Take 1 tablet (5 mg total) by mouth 3 (three) times daily as needed for muscle spasms. 30 tablet 0   fluconazole (DIFLUCAN) 150 MG tablet One tablet by mouth once weekly for 3 weeks. 3 tablet 0   fluticasone (FLONASE) 50 MCG/ACT nasal spray Place 2 sprays into both nostrils daily. 16 g 1   imipramine (TOFRANIL) 25 MG tablet Take 1 tablet (25 mg total) by mouth at bedtime. 30 tablet 3   isosorbide mononitrate (IMDUR) 60 MG 24 hr tablet Take 1 tablet (60 mg total) by mouth daily. 90 tablet 3   loratadine (CLARITIN) 10 MG tablet Take 1 tablet (10 mg total) by mouth daily. 10 tablet 0   methimazole (TAPAZOLE) 5 MG tablet Take 1 tablet by mouth once daily 90 tablet 3   metoprolol succinate (TOPROL-XL) 50 MG 24 hr tablet Take 1 tablet (50 mg total) by mouth daily. Take with or immediately following a meal. 90 tablet 3   nitroGLYCERIN (NITROSTAT) 0.4 MG SL tablet Place 1 tablet (0.4 mg total) under the tongue every 5 (five) minutes as needed for chest pain. 25 tablet 3   nystatin (MYCOSTATIN/NYSTOP) powder Apply 1 application topically 2 (two) times daily. Beneath both breasts 60 g 1   ondansetron (ZOFRAN) 4 MG tablet Take 1 tab by mouth every 6 hours as needed for nausea. 100 tablet 1   potassium chloride SA (KLOR-CON) 20 MEQ tablet Take 1 tablet (20 mEq total) by mouth daily. 90 tablet 3   rosuvastatin (CRESTOR) 20 MG tablet Take 1 tablet by mouth once daily 90 tablet 1   SUMAtriptan (IMITREX) 50 MG tablet Take 1 tablet (50 mg total) by mouth every 2 (two) hours as needed for migraine. May repeat in 2 hours if headache persists or recurs. 10 tablet 5   Vitamin D, Ergocalciferol, (DRISDOL) 1.25 MG (50000 UNIT) CAPS capsule Take 1 capsule (50,000 Units total) by mouth every 7 (seven) days. 6 capsule 0   No current facility-administered medications on file prior to visit.    Allergies  Allergen  Reactions   Ace Inhibitors     REACTION: chronic cough   Celebrex [Celecoxib] Other (See Comments)    "makes me bleed"   Diovan [Valsartan]     angioedema    Family History  Problem Relation Age of  Onset   Hypertension Mother    Rheum arthritis Mother    Hypertension Father    Diabetes Father    Prostate cancer Father    Kidney disease Father    Diabetes Sister    Fibromyalgia Sister    Diabetes Maternal Grandmother    Lung cancer Maternal Grandfather    Arthritis Brother    Migraines Brother    CAD Brother    Fibromyalgia Sister    Migraines Sister    Colon cancer Neg Hx    Thyroid disease Neg Hx    Colon polyps Neg Hx     BP (!) 160/80 (BP Location: Right Arm, Patient Position: Sitting, Cuff Size: Large)   Pulse 76   Ht 5\' 5"  (1.651 m)   Wt 225 lb 6.4 oz (102.2 kg)   SpO2 97%   BMI 37.51 kg/m    Review of Systems Denies fever    Objective:   Physical Exam NECK: There is no palpable thyroid enlargement.  No thyroid nodule is palpable.  No palpable lymphadenopathy at the anterior neck.  Lab Results  Component Value Date   TSH 1.56 05/05/2021   T4TOTAL 12.3 11/28/2011      Assessment & Plan:  Hyperthyroidism: well-controlled.  Please continue the same methimazole.

## 2021-05-15 ENCOUNTER — Other Ambulatory Visit: Payer: Self-pay | Admitting: Endocrinology

## 2021-05-15 ENCOUNTER — Other Ambulatory Visit: Payer: Self-pay | Admitting: Internal Medicine

## 2021-05-22 ENCOUNTER — Other Ambulatory Visit: Payer: Self-pay | Admitting: Internal Medicine

## 2021-05-23 NOTE — Telephone Encounter (Signed)
Outpatient Medication Detail   Disp Refills Start End   isosorbide mononitrate (IMDUR) 60 MG 24 hr tablet 90 tablet 0 05/16/2021    Sig - Route: Take 1 tablet (60 mg total) by mouth daily. Please keep upcoming appt. In March in order to receive future refills.Thank You. - Oral   Sent to pharmacy as: isosorbide mononitrate (IMDUR) 60 MG 24 hr tablet   E-Prescribing Status: Receipt confirmed by pharmacy (05/16/2021 12:47 PM EST)    Pharmacy  [facility] [place] - [place]

## 2021-05-26 ENCOUNTER — Ambulatory Visit (INDEPENDENT_AMBULATORY_CARE_PROVIDER_SITE_OTHER): Payer: Medicare Other | Admitting: Family

## 2021-05-26 ENCOUNTER — Encounter: Payer: Self-pay | Admitting: Family

## 2021-05-26 ENCOUNTER — Telehealth: Payer: Self-pay | Admitting: Family

## 2021-05-26 VITALS — BP 142/81 | HR 77 | Temp 98.3°F | Ht 65.0 in | Wt 224.4 lb

## 2021-05-26 DIAGNOSIS — E785 Hyperlipidemia, unspecified: Secondary | ICD-10-CM | POA: Diagnosis not present

## 2021-05-26 DIAGNOSIS — R7303 Prediabetes: Secondary | ICD-10-CM | POA: Diagnosis not present

## 2021-05-26 DIAGNOSIS — G8929 Other chronic pain: Secondary | ICD-10-CM | POA: Diagnosis not present

## 2021-05-26 DIAGNOSIS — D649 Anemia, unspecified: Secondary | ICD-10-CM | POA: Diagnosis not present

## 2021-05-26 DIAGNOSIS — R079 Chest pain, unspecified: Secondary | ICD-10-CM | POA: Diagnosis not present

## 2021-05-26 DIAGNOSIS — L304 Erythema intertrigo: Secondary | ICD-10-CM | POA: Insufficient documentation

## 2021-05-26 DIAGNOSIS — M545 Low back pain, unspecified: Secondary | ICD-10-CM

## 2021-05-26 DIAGNOSIS — I10 Essential (primary) hypertension: Secondary | ICD-10-CM

## 2021-05-26 LAB — CBC WITH DIFFERENTIAL/PLATELET
Basophils Absolute: 0 10*3/uL (ref 0.0–0.1)
Basophils Relative: 0.4 % (ref 0.0–3.0)
Eosinophils Absolute: 0.1 10*3/uL (ref 0.0–0.7)
Eosinophils Relative: 0.6 % (ref 0.0–5.0)
HCT: 38.8 % (ref 36.0–46.0)
Hemoglobin: 11.9 g/dL — ABNORMAL LOW (ref 12.0–15.0)
Lymphocytes Relative: 35.1 % (ref 12.0–46.0)
Lymphs Abs: 3.1 10*3/uL (ref 0.7–4.0)
MCHC: 30.8 g/dL (ref 30.0–36.0)
MCV: 74.9 fl — ABNORMAL LOW (ref 78.0–100.0)
Monocytes Absolute: 0.7 10*3/uL (ref 0.1–1.0)
Monocytes Relative: 7.5 % (ref 3.0–12.0)
Neutro Abs: 5 10*3/uL (ref 1.4–7.7)
Neutrophils Relative %: 56.4 % (ref 43.0–77.0)
Platelets: 218 10*3/uL (ref 150.0–400.0)
RBC: 5.17 Mil/uL — ABNORMAL HIGH (ref 3.87–5.11)
RDW: 15.4 % (ref 11.5–15.5)
WBC: 8.9 10*3/uL (ref 4.0–10.5)

## 2021-05-26 LAB — COMPREHENSIVE METABOLIC PANEL
ALT: 59 U/L — ABNORMAL HIGH (ref 0–35)
AST: 26 U/L (ref 0–37)
Albumin: 4.1 g/dL (ref 3.5–5.2)
Alkaline Phosphatase: 120 U/L — ABNORMAL HIGH (ref 39–117)
BUN: 14 mg/dL (ref 6–23)
CO2: 29 mEq/L (ref 19–32)
Calcium: 9.5 mg/dL (ref 8.4–10.5)
Chloride: 101 mEq/L (ref 96–112)
Creatinine, Ser: 0.81 mg/dL (ref 0.40–1.20)
GFR: 77.65 mL/min (ref >60.00)
Glucose, Bld: 101 mg/dL — ABNORMAL HIGH (ref 70–99)
Potassium: 4.5 mEq/L (ref 3.5–5.1)
Sodium: 138 mEq/L (ref 135–145)
Total Bilirubin: 0.2 mg/dL (ref 0.2–1.2)
Total Protein: 7.2 g/dL (ref 6.0–8.3)

## 2021-05-26 LAB — LIPID PANEL
Cholesterol: 204 mg/dL — ABNORMAL HIGH (ref 0–200)
HDL: 69.9 mg/dL (ref >39.00)
LDL Cholesterol: 113 mg/dL — ABNORMAL HIGH (ref 0–99)
NonHDL: 134.07
Total CHOL/HDL Ratio: 3
Triglycerides: 107 mg/dL (ref 0.0–149.0)
VLDL: 21.4 mg/dL (ref 0.0–40.0)

## 2021-05-26 LAB — IRON: Iron: 60 ug/dL (ref 42–145)

## 2021-05-26 LAB — HEMOGLOBIN A1C: Hgb A1c MFr Bld: 6.8 % — ABNORMAL HIGH (ref 4.6–6.5)

## 2021-05-26 MED ORDER — ROSUVASTATIN CALCIUM 20 MG PO TABS: 30.0000 mg | ORAL_TABLET | Freq: Every day | ORAL | 1 refills | Status: DC

## 2021-05-26 NOTE — Progress Notes (Signed)
Subjective:   By signing my name below, I, [name], attest that this documentation has been prepared under the direction and in the presence of Denise Alar, NP, 05/26/2021    Patient ID: [name], female    DOB: 03-Sep-1960, 60 y.o.   MRN: [id number]  Chief Complaint  Patient presents with   right side pain    Pain is better. Had a shot in it    HPI Patient is in today for an office visit.  Short of breath: She reports that she has been experiencing mild tightness in her chest and shortness of breath.  Back pain: She was previously seen in the office for lower back pain. She was given methylprednisolone acetate and referred to neurosurgery. She was seen by them and was given a steroid injection in her lower back.  Blood pressure: Her blood pressure has improved since her last visit. She is compliant in taking 50 mg metoprolol and 10 mg amlodipine.  Reflux: She reports that her reflux has improved with the use of omeprazole. Cholesterol: She has a history of high cholesterol but is compliant in taking 20 mg Crestor to help manage this.  Rash: She notes a rash on both breasts bilaterally. She has worked with another provider on this and also sees dermatology but notes very little improvement.   Health Maintenance Due  Topic Date Due   COVID-19 Vaccine (4 - Booster for Pfizer series) 07/15/2020   INFLUENZA VACCINE  01/23/2021   URINE MICROALBUMIN  02/07/2021   TETANUS/TDAP  04/29/2021    Past Medical History:  Diagnosis Date   Allergy    allergic rhinitis   Arthritis    Atypical chest pain    Constipation    Cyst, ovarian    Diabetes mellitus, type II (HCC)    Elevated alkaline phosphatase level    Endometriosis    Esophageal stricture    Fatty liver    Gastroparesis    GERD (gastroesophageal reflux disease)    Heart murmur    Hepatic hemangioma    Hyperlipidemia    Hypertension    Hypertrophic condition of skin    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 1/08- benign   Iron deficiency anemia    Migraine headache    Non-compliance    Peptic ulcer disease    Thyroid disease     Past Surgical History:  Procedure Laterality Date   ABDOMINAL EXPLORATION SURGERY     w/bso    BREAST BIOPSY     CARDIAC CATHETERIZATION  2009   mild non obstructive CAD   CHOLECYSTECTOMY     COLONOSCOPY     ESOPHAGEAL MANOMETRY N/A 08/01/2020   Procedure: ESOPHAGEAL MANOMETRY (EM);  Surgeon: Yetta Flock, MD;  Location: [facility];  Service: Gastroenterology;  Laterality: N/A;   KNEE SURGERY  2005    left knee   LEFT HEART CATH AND CORONARY ANGIOGRAPHY N/A 07/17/2017   Procedure: LEFT HEART CATH AND CORONARY ANGIOGRAPHY;  Surgeon: Martinique, Peter M, MD;  Location: [facility];  Service: Cardiovascular;  Laterality: N/A;   POLYPECTOMY     TOTAL ABDOMINAL HYSTERECTOMY     UPPER GASTROINTESTINAL ENDOSCOPY      Family History  Problem Relation Age of Onset   Hypertension Mother    Rheum arthritis Mother    Hypertension Father    Diabetes Father    Prostate cancer Father    Kidney disease Father    Diabetes Sister    Fibromyalgia Sister  Diabetes Maternal Grandmother    Lung cancer Maternal Grandfather    Arthritis Brother    Migraines Brother    CAD Brother    Fibromyalgia Sister    Migraines Sister    Colon cancer Neg Hx    Thyroid disease Neg Hx    Colon polyps Neg Hx     Social History   Socioeconomic History   Marital status: Widowed    Spouse name: Not on file   Number of children: 2   Years of education: Not on file   Highest education level: Not on file  Occupational History   Occupation: DISABILITY    Employer: UNEMPLOYED  Tobacco Use   Smoking status: Former    Packs/day: 0.50    Years: 18.00    Pack years: 9.00    Types: Cigarettes    Start date: 07/29/1977    Quit date: 06/25/1994    Years since quitting: 26.9   Smokeless tobacco: Never   Tobacco comments:    quit 19 years ago  Vaping Use    Vaping Use: Never used  Substance and Sexual Activity   Alcohol use: Yes    Alcohol/week: 0.0 standard drinks    Comment: social drinker   Drug use: No   Sexual activity: Not Currently  Other Topics Concern   Not on file  Social History Narrative   Widowed   Has 2 grown children.  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    Former Smoker Quit tobacco in 1996.  She was a pack a day smoker for approximately 10 years.    Alcohol use-yes: Social    Daily Caffeine Use:6 pack of pepsi daily     Illicit Drug Use - no    Patient does not get regular exercise.       Smoking Status:  quit   Social Determinants of Health   Financial Resource Strain: Not on file  Food Insecurity: Not on file  Transportation Needs: Not on file  Physical Activity: Not on file  Stress: Not on file  Social Connections: Not on file  Intimate Partner Violence: Not on file    Outpatient Medications Prior to Visit  Medication Sig Dispense Refill   Albuterol Sulfate 108 (90 Base) MCG/ACT AEPB Inhale 2 puffs into the lungs 4 (four) times daily. 1 each 3   alclomethasone (ACLOVATE) 0.05 % cream Apply topically 2 (two) times daily as needed (Rash). 180 g 3   Alirocumab (PRALUENT) 150 MG/ML SOAJ Inject 1 pen into the skin every 14 (fourteen) days. 2 mL 11   amLODipine (NORVASC) 10 MG tablet Take 1 tablet by mouth once daily 90 tablet 1   aspirin 81 MG chewable tablet Chew 1 tablet (81 mg total) by mouth daily.     clobetasol (TEMOVATE) 0.05 % external solution Apply 1 application topically 2 (two) times daily. 50 mL 6   clotrimazole-betamethasone (LOTRISONE) cream Apply 1 application topically 2 (two) times daily. 30 g 0   fluconazole (DIFLUCAN) 150 MG tablet One tablet by mouth once weekly for 3 weeks. 3 tablet 0   fluticasone (FLONASE) 50 MCG/ACT nasal spray Place 2 sprays into both nostrils daily. 16 g 1   imipramine (TOFRANIL) 25 MG tablet Take 1 tablet (25 mg total)  by mouth at bedtime. 30 tablet 3   loratadine (CLARITIN) 10 MG tablet Take 1 tablet (10 mg total) by mouth daily. 10 tablet 0   methimazole (TAPAZOLE) 5 MG tablet  Take 1 tablet by mouth once daily 90 tablet 0   metoprolol succinate (TOPROL-XL) 50 MG 24 hr tablet Take 1 tablet (50 mg total) by mouth daily. Take with or immediately following a meal. 90 tablet 3   nitroGLYCERIN (NITROSTAT) 0.4 MG SL tablet Place 1 tablet (0.4 mg total) under the tongue every 5 (five) minutes as needed for chest pain. 25 tablet 3   nystatin (MYCOSTATIN/NYSTOP) powder Apply 1 application topically 2 (two) times daily. Beneath both breasts 60 g 1   ondansetron (ZOFRAN) 4 MG tablet Take 1 tab by mouth every 6 hours as needed for nausea. 100 tablet 1   potassium chloride SA (KLOR-CON) 20 MEQ tablet Take 1 tablet (20 mEq total) by mouth daily. Please keep upcoming appt. In March in order to receive future refills.Thank You. 90 tablet 0   rosuvastatin (CRESTOR) 20 MG tablet Take 1 tablet by mouth once daily 90 tablet 1   SUMAtriptan (IMITREX) 50 MG tablet Take 1 tablet (50 mg total) by mouth every 2 (two) hours as needed for migraine. May repeat in 2 hours if headache persists or recurs. 10 tablet 5   Vitamin D, Ergocalciferol, (DRISDOL) 1.25 MG (50000 UNIT) CAPS capsule Take 1 capsule (50,000 Units total) by mouth every 7 (seven) days. 6 capsule 0   cyclobenzaprine (FLEXERIL) 5 MG tablet Take 1 tablet (5 mg total) by mouth 3 (three) times daily as needed for muscle spasms. 30 tablet 0   isosorbide mononitrate (IMDUR) 60 MG 24 hr tablet Take 1 tablet (60 mg total) by mouth daily. Please keep upcoming appt. In March in order to receive future refills.Thank You. (Patient not taking: Reported on 05/26/2021) 90 tablet 0   No facility-administered medications prior to visit.    Allergies  Allergen Reactions   Ace Inhibitors     REACTION: chronic cough   Celebrex [Celecoxib] Other (See Comments)    "makes me bleed"   Diovan  [Valsartan]     angioedema    Review of Systems  Respiratory:  Positive for shortness of breath.   Gastrointestinal:  Negative for heartburn.  Musculoskeletal:  Positive for back pain and joint pain.      Objective:    Physical Exam Constitutional:      General: She is not in acute distress.    Appearance: Normal appearance. She is not ill-appearing.  HENT:     Head: Normocephalic and atraumatic.     Right Ear: External ear normal.     Left Ear: External ear normal.  Eyes:     Extraocular Movements: Extraocular movements intact.     Pupils: Pupils are equal, round, and reactive to light.  Cardiovascular:     Rate and Rhythm: Normal rate and regular rhythm.     Heart sounds: Normal heart sounds. No murmur heard.   No gallop.  Pulmonary:     Effort: Pulmonary effort is normal. No respiratory distress.     Breath sounds: Normal breath sounds. No wheezing or rales.  Skin:    General: Skin is warm and dry.     Comments: (+) hypertrophic skin beneath both breasts bilaterally   Neurological:     Mental Status: She is alert and oriented to person, place, and time.  Psychiatric:        Behavior: Behavior normal.        Judgment: Judgment normal.    BP (!) 142/81   Pulse 77   Temp 98.3 F (36.8 C) (Oral)   Ht 5'  5" (1.651 m)   Wt 224 lb 6.4 oz (101.8 kg)   SpO2 100%   BMI 37.34 kg/m  Wt Readings from Last 3 Encounters:  05/26/21 224 lb 6.4 oz (101.8 kg)  05/05/21 225 lb 6.4 oz (102.2 kg)  05/01/21 223 lb (101.2 kg)       Assessment & Plan:   Problem List Items Addressed This Visit       Unprioritized   Low back pain    Improved following ESI. This is being managed by neurosurgery.       Intertrigo    Some improvement in her symptoms. She has an upcoming appointment with her dermatologist next week and I encouraged her to keep this appointment.      Hyperlipidemia - Primary    Lab Results  Component Value Date   CHOL 113 05/27/2020   HDL 54 05/27/2020    LDLCALC 40 05/27/2020   LDLDIRECT 76 07/21/2019   TRIG 102 05/27/2020   CHOLHDL 2.1 05/27/2020  Due for follow up lipid panel. Continue crestor 2m and praluent.       Relevant Orders   Lipid panel   Essential hypertension, benign    BP Readings from Last 3 Encounters:  05/26/21 (!) 142/81  05/05/21 (!) 160/80  05/01/21 (!) 144/82  Fair control. Continue amlodipine and metoprolol.        Relevant Orders   Comp Met (CMET)   Chest pain    Patient reports that chest tightness and SOB is similar to the symptoms she experienced before cardiology placed her on Imdur. She has run out of imdur. I advised her to restart today.  If symptoms do not improve contact Dr. RHarrington Challenger If severe/worsening symptoms go to ED. Pt verbalizes understanding.       Borderline type 2 diabetes mellitus   Relevant Orders   Hemoglobin A1c   Anemia   Relevant Orders   CBC with Differential/Platelet   Iron   No orders of the defined types were placed in this encounter.   I, MDebbrah Alar NP, personally preformed the services described in this documentation.  All medical record entries made by the scribe were at my direction and in my presence.  I have reviewed the chart and discharge instructions (if applicable) and agree that the record reflects my personal performance and is accurate and complete. 05/26/2021  I,[name],acting as a sEducation administratorfor MNance Pear NP.,have documented all relevant documentation on the behalf of MNance Pear NP,as directed by  MNance Pear NP while in the presence of MNance Pear NP.  MNance Pear NP

## 2021-05-26 NOTE — Assessment & Plan Note (Signed)
Patient reports that chest tightness and SOB is similar to the symptoms she experienced before cardiology placed her on Imdur. She has run out of imdur. I advised her to restart today.  If symptoms do not improve contact Dr. Harrington Challenger. If severe/worsening symptoms go to ED. Pt verbalizes understanding.

## 2021-05-26 NOTE — Assessment & Plan Note (Signed)
BP Readings from Last 3 Encounters:  05/26/21 (!) 142/81  05/05/21 (!) 160/80  05/01/21 (!) 144/82   Fair control. Continue amlodipine and metoprolol.

## 2021-05-26 NOTE — Assessment & Plan Note (Signed)
>>  ASSESSMENT AND PLAN FOR LOW BACK PAIN WRITTEN ON 05/26/2021  8:14 AM BY O'SULLIVAN, Charlynn Salih, NP  Improved following ESI. This is being managed by neurosurgery.

## 2021-05-26 NOTE — Assessment & Plan Note (Signed)
Some improvement in her symptoms. She has an upcoming appointment with her dermatologist next week and I encouraged her to keep this appointment.

## 2021-05-26 NOTE — Telephone Encounter (Signed)
Cholesterol is above goal.  I would like her to increase her crestor 20mg  to 1.5 tabs/day or 30mg . Please repeat fasting lipid panel in 6 weeks.

## 2021-05-26 NOTE — Patient Instructions (Signed)
Please complete lab work prior to leaving. Restart Imdur today.

## 2021-05-26 NOTE — Assessment & Plan Note (Signed)
Improved following ESI. This is being managed by neurosurgery.

## 2021-05-26 NOTE — Assessment & Plan Note (Addendum)
Lab Results  Component Value Date   CHOL 113 05/27/2020   HDL 54 05/27/2020   LDLCALC 40 05/27/2020   LDLDIRECT 76 07/21/2019   TRIG 102 05/27/2020   CHOLHDL 2.1 05/27/2020   Due for follow up lipid panel. Continue crestor 20mg  and praluent.

## 2021-05-29 ENCOUNTER — Other Ambulatory Visit: Payer: Self-pay

## 2021-05-29 DIAGNOSIS — E785 Hyperlipidemia, unspecified: Secondary | ICD-10-CM

## 2021-05-29 NOTE — Telephone Encounter (Signed)
Patient advised of results, medication dose increased and provider's advise. She verbalized understanding,. Was scheduled for labs 07-14-21

## 2021-06-01 ENCOUNTER — Ambulatory Visit (INDEPENDENT_AMBULATORY_CARE_PROVIDER_SITE_OTHER): Payer: Medicare Other | Admitting: Physician Assistant

## 2021-06-01 ENCOUNTER — Other Ambulatory Visit: Payer: Self-pay

## 2021-06-01 ENCOUNTER — Encounter: Payer: Self-pay | Admitting: Physician Assistant

## 2021-06-01 DIAGNOSIS — L700 Acne vulgaris: Secondary | ICD-10-CM | POA: Diagnosis not present

## 2021-06-01 DIAGNOSIS — D485 Neoplasm of uncertain behavior of skin: Secondary | ICD-10-CM | POA: Diagnosis not present

## 2021-06-01 MED ORDER — TRETINOIN 0.1 % EX CREA: TOPICAL_CREAM | Freq: Every day | CUTANEOUS | 6 refills | Status: AC

## 2021-06-01 NOTE — Patient Instructions (Signed)

## 2021-06-01 NOTE — Progress Notes (Signed)
   Follow-Up Visit   Subjective  [name] is a 60 y.o. female who presents for the following: Procedure (Patient here today to have a few lesions removed from her face.  ) She also wants something for her acne and blackheads.   The following portions of the chart were reviewed this encounter and updated as appropriate:  Tobacco  Allergies  Meds  Problems  Med Hx  Surg Hx  Fam Hx      Objective  Well appearing patient in no apparent distress; mood and affect are within normal limits.  A focused examination was performed including face. Relevant physical exam findings are noted in the Assessment and Plan.  Left Buccal Cheek Silk x 2. Dense hyperpigmented papule.  Right Buccal Cheek Silk x 2. Brown plaque.  Right Malar Cheek, face- scattered. Large open comedomes.   Assessment & Plan  Neoplasm of uncertain behavior of skin (2) Left Buccal Cheek  Skin / nail biopsy Type of biopsy: punch   Informed consent: discussed and consent obtained   Procedure prep:  Patient was prepped and draped in usual sterile fashion (nonsterile) Prep type:  Chlorhexidine Anesthesia: the lesion was anesthetized in a standard fashion   Anesthetic:  1% lidocaine w/ epinephrine 1-100,000 local infiltration Punch size:  5 mm Suture size:  5-0 Suture type: silk   Suture removal (days):  9 Outcome: patient tolerated procedure well   Post-procedure details: wound care instructions given   Post-procedure details comment:  Nonsterile  Specimen 1 - Surgical pathology Differential Diagnosis: r/o cyst  Check Margins: No  Right Buccal Cheek  Skin / nail biopsy Type of biopsy: punch   Informed consent: discussed and consent obtained   Procedure prep:  Patient was prepped and draped in usual sterile fashion (nonsterile) Prep type:  Chlorhexidine Anesthesia: the lesion was anesthetized in a standard fashion   Anesthetic:  1% lidocaine w/ epinephrine 1-100,000 local infiltration Punch  size:  5 mm Suture size:  5-0 Suture type: silk   Suture removal (days):  9 Outcome: patient tolerated procedure well   Post-procedure details: wound care instructions given   Post-procedure details comment:  Nonsterile  Specimen 2 - Surgical pathology Differential Diagnosis: r/o cyst  Check Margins: No  Acne vulgaris face- scattered.; Right Malar Cheek  tretinoin (RETIN-A) 0.1 % cream - Right Malar Cheek, face- scattered. Apply topically at bedtime.    I, Keyaria Lawson, PA-C, have reviewed all documentation's for this visit.  The documentation on 06/01/21 for the exam, diagnosis, procedures and orders are all accurate and complete.

## 2021-06-02 ENCOUNTER — Ambulatory Visit (INDEPENDENT_AMBULATORY_CARE_PROVIDER_SITE_OTHER): Payer: Medicare Other

## 2021-06-02 VITALS — BP 130/76 | HR 72 | Temp 99.0°F | Resp 16 | Ht 65.0 in | Wt 224.6 lb

## 2021-06-02 DIAGNOSIS — Z0000 Encounter for general adult medical examination without abnormal findings: Secondary | ICD-10-CM | POA: Diagnosis not present

## 2021-06-02 NOTE — Patient Instructions (Signed)
[name] , Thank you for taking time to come for your Medicare Wellness Visit. I appreciate your ongoing commitment to your health goals. Please review the following plan we discussed and let me know if I can assist you in the future.   Screening recommendations/referrals: Colonoscopy: 07/16/2017-Due-07/17/2027 Mammogram: 09/28/2020-Due-09/28/2021 Bone Density: Due at age 60 Recommended yearly ophthalmology/optometry visit for glaucoma screening and checkup Recommended yearly dental visit for hygiene and checkup  Vaccinations: Influenza vaccine: Up to date Pneumococcal vaccine: Up to date Tdap vaccine: Discuss with pharmacy Shingles vaccine: Completed vaccines  Covid-19: Up to date  Advanced directives: Information given today  Conditions/risks identified: See problem list  Next appointment: Follow up in one year for your annual wellness visit. 06/08/2022 @ 9:00  Preventive Care 40-64 Years, Female Preventive care refers to lifestyle choices and visits with your health care provider that can promote health and wellness. What does preventive care include? A yearly physical exam. This is also called an annual well check. Dental exams once or twice a year. Routine eye exams. Ask your health care provider how often you should have your eyes checked. Personal lifestyle choices, including: Daily care of your teeth and gums. Regular physical activity. Eating a healthy diet. Avoiding tobacco and drug use. Limiting alcohol use. Practicing safe sex. Taking low-dose aspirin daily starting at age 2. Taking vitamin and mineral supplements as recommended by your health care provider. What happens during an annual well check? The services and screenings done by your health care provider during your annual well check will depend on your age, overall health, lifestyle risk factors, and family history of disease. Counseling  Your health care provider may ask you questions about your: Alcohol  use. Tobacco use. Drug use. Emotional well-being. Home and relationship well-being. Sexual activity. Eating habits. Work and work Statistician. Method of birth control. Menstrual cycle. Pregnancy history. Screening  You may have the following tests or measurements: Height, weight, and BMI. Blood pressure. Lipid and cholesterol levels. These may be checked every 5 years, or more frequently if you are over 15 years old. Skin check. Lung cancer screening. You may have this screening every year starting at age 42 if you have a 30-pack-year history of smoking and currently smoke or have quit within the past 15 years. Fecal occult blood test (FOBT) of the stool. You may have this test every year starting at age 16. Flexible sigmoidoscopy or colonoscopy. You may have a sigmoidoscopy every 5 years or a colonoscopy every 10 years starting at age 41. Hepatitis C blood test. Hepatitis B blood test. Sexually transmitted disease (STD) testing. Diabetes screening. This is done by checking your blood sugar (glucose) after you have not eaten for a while (fasting). You may have this done every 1-3 years. Mammogram. This may be done every 1-2 years. Talk to your health care provider about when you should start having regular mammograms. This may depend on whether you have a family history of breast cancer. BRCA-related cancer screening. This may be done if you have a family history of breast, ovarian, tubal, or peritoneal cancers. Pelvic exam and Pap test. This may be done every 3 years starting at age 55. Starting at age 36, this may be done every 5 years if you have a Pap test in combination with an HPV test. Bone density scan. This is done to screen for osteoporosis. You may have this scan if you are at high risk for osteoporosis. Discuss your test results, treatment options, and if necessary,  the need for more tests with your health care provider. Vaccines  Your health care provider may recommend  certain vaccines, such as: Influenza vaccine. This is recommended every year. Tetanus, diphtheria, and acellular pertussis (Tdap, Td) vaccine. You may need a Td booster every 10 years. Zoster vaccine. You may need this after age 92. Pneumococcal 13-valent conjugate (PCV13) vaccine. You may need this if you have certain conditions and were not previously vaccinated. Pneumococcal polysaccharide (PPSV23) vaccine. You may need one or two doses if you smoke cigarettes or if you have certain conditions. Talk to your health care provider about which screenings and vaccines you need and how often you need them. This information is not intended to replace advice given to you by your health care provider. Make sure you discuss any questions you have with your health care provider. Document Released: 07/08/2015 Document Revised: 02/29/2016 Document Reviewed: 04/12/2015 Elsevier Interactive Patient Education  2017 [place] Prevention in the Home Falls can cause injuries. They can happen to people of all ages. There are many things you can do to make your home safe and to help prevent falls. What can I do on the outside of my home? Regularly fix the edges of walkways and driveways and fix any cracks. Remove anything that might make you trip as you walk through a door, such as a raised step or threshold. Trim any bushes or trees on the path to your home. Use bright outdoor lighting. Clear any walking paths of anything that might make someone trip, such as rocks or tools. Regularly check to see if handrails are loose or broken. Make sure that both sides of any steps have handrails. Any raised decks and porches should have guardrails on the edges. Have any leaves, snow, or ice cleared regularly. Use sand or salt on walking paths during winter. Clean up any spills in your garage right away. This includes oil or grease spills. What can I do in the bathroom? Use night lights. Install grab bars  by the toilet and in the tub and shower. Do not use towel bars as grab bars. Use non-skid mats or decals in the tub or shower. If you need to sit down in the shower, use a plastic, non-slip stool. Keep the floor dry. Clean up any water that spills on the floor as soon as it happens. Remove soap buildup in the tub or shower regularly. Attach bath mats securely with double-sided non-slip rug tape. Do not have throw rugs and other things on the floor that can make you trip. What can I do in the bedroom? Use night lights. Make sure that you have a light by your bed that is easy to reach. Do not use any sheets or blankets that are too big for your bed. They should not hang down onto the floor. Have a firm chair that has side arms. You can use this for support while you get dressed. Do not have throw rugs and other things on the floor that can make you trip. What can I do in the kitchen? Clean up any spills right away. Avoid walking on wet floors. Keep items that you use a lot in easy-to-reach places. If you need to reach something above you, use a strong step stool that has a grab bar. Keep electrical cords out of the way. Do not use floor polish or wax that makes floors slippery. If you must use wax, use non-skid floor wax. Do not have throw rugs  and other things on the floor that can make you trip. What can I do with my stairs? Do not leave any items on the stairs. Make sure that there are handrails on both sides of the stairs and use them. Fix handrails that are broken or loose. Make sure that handrails are as long as the stairways. Check any carpeting to make sure that it is firmly attached to the stairs. Fix any carpet that is loose or worn. Avoid having throw rugs at the top or bottom of the stairs. If you do have throw rugs, attach them to the floor with carpet tape. Make sure that you have a light switch at the top of the stairs and the bottom of the stairs. If you do not have them, ask  someone to add them for you. What else can I do to help prevent falls? Wear shoes that: Do not have high heels. Have rubber bottoms. Are comfortable and fit you well. Are closed at the toe. Do not wear sandals. If you use a stepladder: Make sure that it is fully opened. Do not climb a closed stepladder. Make sure that both sides of the stepladder are locked into place. Ask someone to hold it for you, if possible. Clearly mark and make sure that you can see: Any grab bars or handrails. First and last steps. Where the edge of each step is. Use tools that help you move around (mobility aids) if they are needed. These include: Canes. Walkers. Scooters. Crutches. Turn on the lights when you go into a dark area. Replace any light bulbs as soon as they burn out. Set up your furniture so you have a clear path. Avoid moving your furniture around. If any of your floors are uneven, fix them. If there are any pets around you, be aware of where they are. Review your medicines with your doctor. Some medicines can make you feel dizzy. This can increase your chance of falling. Ask your doctor what other things that you can do to help prevent falls. This information is not intended to replace advice given to you by your health care provider. Make sure you discuss any questions you have with your health care provider. Document Released: 04/07/2009 Document Revised: 11/17/2015 Document Reviewed: 07/16/2014 Elsevier Interactive Patient Education  2017 [employer].

## 2021-06-02 NOTE — Progress Notes (Signed)
Subjective:   LEGNA MAUSOLF is a 60 y.o. female who presents for Medicare Annual (Subsequent) preventive examination.    Review of Systems     Cardiac Risk Factors include: hypertension;dyslipidemia;obesity (BMI >30kg/m2)     Objective:    Today's Vitals   06/02/21 0900  BP: 130/76  Pulse: 72  Resp: 16  Temp: 99 F (37.2 C)  TempSrc: Oral  SpO2: 98%  Weight: 224 lb 9.6 oz (101.9 kg)  Height: 5\' 5"  (1.651 m)   Body mass index is 37.38 kg/m.  Advanced Directives 06/02/2021 07/15/2020 01/27/2020 07/31/2019 07/28/2019 04/06/2019 11/21/2018  Does Patient Have a Medical Advance Directive? No No Yes Yes No No Yes  Type of Advance Directive - Public librarian;Living will [place];Living will - - -  Does patient want to make changes to medical advance directive? - - No - Patient declined - - - No - Patient declined  Copy of [place] in Chart? - - No - copy requested - - - -  Would patient like information on creating a medical advance directive? Yes (MAU/Ambulatory/Procedural Areas - Information given) No - Patient declined - - No - Patient declined - -    Current Medications (verified) Outpatient Encounter Medications as of 06/02/2021  Medication Sig   Albuterol Sulfate 108 (90 Base) MCG/ACT AEPB Inhale 2 puffs into the lungs 4 (four) times daily.   alclomethasone (ACLOVATE) 0.05 % cream Apply topically 2 (two) times daily as needed (Rash).   Alirocumab (PRALUENT) 150 MG/ML SOAJ Inject 1 pen into the skin every 14 (fourteen) days.   amLODipine (NORVASC) 10 MG tablet Take 1 tablet by mouth once daily   aspirin 81 MG chewable tablet Chew 1 tablet (81 mg total) by mouth daily.   clobetasol (TEMOVATE) 0.05 % external solution Apply 1 application topically 2 (two) times daily.   clotrimazole-betamethasone (LOTRISONE) cream Apply 1 application topically 2 (two) times daily.   fluconazole (DIFLUCAN) 150 MG tablet One tablet by mouth  once weekly for 3 weeks.   fluticasone (FLONASE) 50 MCG/ACT nasal spray Place 2 sprays into both nostrils daily.   imipramine (TOFRANIL) 25 MG tablet Take 1 tablet (25 mg total) by mouth at bedtime.   isosorbide mononitrate (IMDUR) 60 MG 24 hr tablet Take 1 tablet (60 mg total) by mouth daily. Please keep upcoming appt. In March in order to receive future refills.Thank You.   loratadine (CLARITIN) 10 MG tablet Take 1 tablet (10 mg total) by mouth daily.   methimazole (TAPAZOLE) 5 MG tablet Take 1 tablet by mouth once daily   metoprolol succinate (TOPROL-XL) 50 MG 24 hr tablet Take 1 tablet (50 mg total) by mouth daily. Take with or immediately following a meal.   nitroGLYCERIN (NITROSTAT) 0.4 MG SL tablet Place 1 tablet (0.4 mg total) under the tongue every 5 (five) minutes as needed for chest pain.   nystatin (MYCOSTATIN/NYSTOP) powder Apply 1 application topically 2 (two) times daily. Beneath both breasts   ondansetron (ZOFRAN) 4 MG tablet Take 1 tab by mouth every 6 hours as needed for nausea.   potassium chloride SA (KLOR-CON) 20 MEQ tablet Take 1 tablet (20 mEq total) by mouth daily. Please keep upcoming appt. In March in order to receive future refills.Thank You.   rosuvastatin (CRESTOR) 20 MG tablet Take 1.5 tablets (30 mg total) by mouth daily.   SUMAtriptan (IMITREX) 50 MG tablet Take 1 tablet (50 mg total) by mouth every 2 (two)  hours as needed for migraine. May repeat in 2 hours if headache persists or recurs.   tretinoin (RETIN-A) 0.1 % cream Apply topically at bedtime.   Vitamin D, Ergocalciferol, (DRISDOL) 1.25 MG (50000 UNIT) CAPS capsule Take 1 capsule (50,000 Units total) by mouth every 7 (seven) days.   No facility-administered encounter medications on file as of 06/02/2021.    Allergies (verified) Ace inhibitors, Celebrex [celecoxib], and Diovan [valsartan]   History: Past Medical History:  Diagnosis Date   Allergy    allergic rhinitis   Arthritis    Atypical chest pain     Constipation    Cyst, ovarian    Diabetes mellitus, type II (HCC)    Elevated alkaline phosphatase level    Endometriosis    Esophageal stricture    Fatty liver    Gastroparesis    GERD (gastroesophageal reflux disease)    Heart murmur    Hepatic hemangioma    Hyperlipidemia    Hypertension    Hypertrophic condition of skin    acrokeratoelastoidosis- s/p 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 1/08- benign   Iron deficiency anemia    Migraine headache    Non-compliance    Peptic ulcer disease    Thyroid disease    Past Surgical History:  Procedure Laterality Date   ABDOMINAL EXPLORATION SURGERY     w/bso    BREAST BIOPSY     CARDIAC CATHETERIZATION  2009   mild non obstructive CAD   CHOLECYSTECTOMY     COLONOSCOPY     ESOPHAGEAL MANOMETRY N/A 08/01/2020   Procedure: ESOPHAGEAL MANOMETRY (EM);  Surgeon: Yetta Flock, MD;  Location: [facility];  Service: Gastroenterology;  Laterality: N/A;   KNEE SURGERY  2005    left knee   LEFT HEART CATH AND CORONARY ANGIOGRAPHY N/A 07/17/2017   Procedure: LEFT HEART CATH AND CORONARY ANGIOGRAPHY;  Surgeon: Martinique, Peter M, MD;  Location: [facility];  Service: Cardiovascular;  Laterality: N/A;   POLYPECTOMY     TOTAL ABDOMINAL HYSTERECTOMY     UPPER GASTROINTESTINAL ENDOSCOPY     Family History  Problem Relation Age of Onset   Hypertension Mother    Rheum arthritis Mother    Hypertension Father    Diabetes Father    Prostate cancer Father    Kidney disease Father    Diabetes Sister    Fibromyalgia Sister    Diabetes Maternal Grandmother    Lung cancer Maternal Grandfather    Arthritis Brother    Migraines Brother    CAD Brother    Fibromyalgia Sister    Migraines Sister    Colon cancer Neg Hx    Thyroid disease Neg Hx    Colon polyps Neg Hx    Social History   Socioeconomic History   Marital status: Widowed    Spouse name: Not on file   Number of children: 2   Years of education: Not on file   Highest education level:  Not on file  Occupational History   Occupation: DISABILITY    Employer: UNEMPLOYED  Tobacco Use   Smoking status: Former    Packs/day: 0.50    Years: 18.00    Pack years: 9.00    Types: Cigarettes    Start date: 07/29/1977    Quit date: 06/25/1994    Years since quitting: 26.9   Smokeless tobacco: Never   Tobacco comments:    quit 19 years ago  Vaping Use   Vaping Use: Never used  Substance and Sexual Activity  Alcohol use: Yes    Alcohol/week: 0.0 standard drinks    Comment: social drinker   Drug use: No   Sexual activity: Not Currently  Other Topics Concern   Not on file  Social History Narrative   Widowed   Has 2 grown children.  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    Former Smoker Quit tobacco in 1996.  She was a pack a day smoker for approximately 10 years.    Alcohol use-yes: Social    Daily Caffeine Use:6 pack of pepsi daily     Illicit Drug Use - no    Patient does not get regular exercise.       Smoking Status:  quit   Social Determinants of Health   Financial Resource Strain: Low Risk    Difficulty of Paying Living Expenses: Not hard at all  Food Insecurity: No Food Insecurity   Worried About Charity fundraiser in the Last Year: Never true   [place] in the Last Year: Never true  Transportation Needs: No Transportation Needs   Lack of Transportation (Medical): No   Lack of Transportation (Non-Medical): No  Physical Activity: Insufficiently Active   Days of Exercise per Week: 2 days   Minutes of Exercise per Session: 30 min  Stress: No Stress Concern Present   Feeling of Stress : Not at all  Social Connections: Moderately Isolated   Frequency of Communication with Friends and Family: More than three times a week   Frequency of Social Gatherings with Friends and Family: More than three times a week   Attends Religious Services: More than 4 times per year   Active Member of [employer] or Organizations: No    Attends Archivist Meetings: Never   Marital Status: Widowed    Tobacco Counseling Counseling given: Not Answered Tobacco comments: quit 19 years ago   Clinical Intake:  Pre-visit preparation completed: Yes  Pain : No/denies pain     BMI - recorded: 37.38 Nutritional Status: BMI > 30  Obese Nutritional Risks: None Diabetes: No  How often do you need to have someone help you when you read instructions, pamphlets, or other written materials from your doctor or pharmacy?: 1 - Never  Diabetic?No  Interpreter Needed?: No  Information entered by :: Caroleen Hamman LPN   Activities of Daily Living In your present state of health, do you have any difficulty performing the following activities: 06/02/2021  Hearing? N  Vision? N  Difficulty concentrating or making decisions? N  Walking or climbing stairs? N  Dressing or bathing? N  Doing errands, shopping? N  Preparing Food and eating ? N  Using the Toilet? N  In the past six months, have you accidently leaked urine? N  Do you have problems with loss of bowel control? N  Managing your Medications? N  Managing your Finances? N  Housekeeping or managing your Housekeeping? N  Some recent data might be hidden    Patient Care Team: [name], NP as PCP - General (Internal Medicine) Fay Records, MD as PCP - Cardiology (Cardiology) [name], MD as Consulting Physician (Internal Medicine) Amalia Greenhouse, MD as Consulting Physician (Endocrinology) [name], MD as Consulting Physician (Oncology) Warren Danes, PA-C as Physician Assistant (Dermatology)  Indicate any recent Medical Services you may have received from other than [facility] providers in the past year (date may be approximate).     Assessment:  This is a routine wellness examination for Perle.  Hearing/Vision screen Hearing Screening - Comments:: No issues Vision Screening - Comments:: Last eye exam-several years  ago  Dietary issues and exercise activities discussed: Current Exercise Habits: Home exercise routine, Type of exercise: walking, Time (Minutes): 30, Frequency (Times/Week): 2, Weekly Exercise (Minutes/Week): 60, Intensity: Mild, Exercise limited by: None identified   Goals Addressed             This Visit's Progress    Increase physical activity   On track    Reduce salt intake to 2 grams per day or less   On track    Reduce sugar intake   On track      Depression Screen PHQ 2/9 Scores 06/02/2021 05/26/2021 02/21/2021 12/30/2020 06/10/2020 04/21/2019 07/04/2017  PHQ - 2 Score 0 0 0 0 0 0 0  PHQ- 9 Score - - - - - 6 -    Fall Risk Fall Risk  06/02/2021 05/26/2021 02/21/2021 12/30/2020 07/04/2017  Falls in the past year? 0 0 0 0 No  Number falls in past yr: 0 0 0 0 -  Injury with Fall? 0 0 0 0 -  Risk for fall due to : - No Fall Risks No Fall Risks - -  Follow up Falls prevention discussed Falls evaluation completed Falls evaluation completed - -    FALL RISK PREVENTION PERTAINING TO THE HOME:  Any stairs in or around the home? Yes  If so, are there any without handrails? No  Home free of loose throw rugs in walkways, pet beds, electrical cords, etc? Yes  Adequate lighting in your home to reduce risk of falls? Yes   ASSISTIVE DEVICES UTILIZED TO PREVENT FALLS:  Life alert? No  Use of a cane, walker or w/c? No  Grab bars in the bathroom? No  Shower chair or bench in shower? No  Elevated toilet seat or a handicapped toilet? Yes   TIMED UP AND GO:  Was the test performed? Yes .  Length of time to ambulate 10 feet: 11 sec.   Gait steady and fast without use of assistive device  Cognitive Function:Normal cognitive status assessed by direct observation by this Nurse Health Advisor. No abnormalities found.   MMSE - Mini Mental State Exam 02/11/2015  Not completed: (No Data)        Immunizations Immunization History  Administered Date(s) Administered   Fluad Quad(high Dose  65+) 03/31/2019   Influenza Split 04/30/2011   Influenza Whole 04/19/2006, 04/17/2007, 03/08/2009, 03/01/2010   Influenza, Quadrivalent, Recombinant, Inj, Pf 03/31/2019   Influenza, Seasonal, Injecte, Preservative Fre 05/30/2012   Influenza,inj,Quad PF,6+ Mos 02/15/2014, 03/08/2015, 04/30/2016, 04/24/2017, 04/04/2018, 06/10/2020   Influenza-Unspecified 02/23/2021   PFIZER(Purple Top)SARS-COV-2 Vaccination 09/07/2019, 09/29/2019, 05/20/2020   Pfizer Covid-19 Vaccine Bivalent Booster 74yrs & up 02/23/2021   Pneumococcal Conjugate-13 04/04/2018   Pneumococcal Polysaccharide-23 03/08/2009, 09/23/2020   Td 05/17/1999   Tdap 04/30/2011   Zoster Recombinat (Shingrix) 03/31/2019, 08/02/2019    TDAP status: Due, Education has been provided regarding the importance of this vaccine. Advised may receive this vaccine at local pharmacy or Health Dept. Aware to provide a copy of the vaccination record if obtained from local pharmacy or Health Dept. Verbalized acceptance and understanding.  Flu Vaccine status: Up to date  Pneumococcal vaccine status: Up to date  Covid-19 vaccine status: Completed vaccines  Qualifies for Shingles Vaccine? No   Zostavax completed No   Shingrix Completed?: Yes  Screening Tests Health Maintenance  Topic Date Due  URINE MICROALBUMIN  02/07/2021   TETANUS/TDAP  04/29/2021   MAMMOGRAM  09/29/2022   Pneumococcal Vaccine 17-17 Years old (4 - PPSV23 if available, else PCV20) 09/23/2025   COLONOSCOPY (Pts 45-37yrs Insurance coverage will need to be confirmed)  07/17/2027   INFLUENZA VACCINE  Completed   COVID-19 Vaccine  Completed   Hepatitis C Screening  Completed   HIV Screening  Completed   Zoster Vaccines- Shingrix  Completed   HPV VACCINES  Aged Out    Health Maintenance  Health Maintenance Due  Topic Date Due   URINE MICROALBUMIN  02/07/2021   TETANUS/TDAP  04/29/2021    Colorectal cancer screening: Type of screening: Colonoscopy. Completed 07/16/2017.  Repeat every 10 years  Mammogram status: Completed bilateral 09/28/2020. Repeat every year  Bone Density status: Not yet indicated  Lung Cancer Screening: (Low Dose CT Chest recommended if Age 64-80 years, 30 pack-year currently smoking OR have quit w/in 15years.) does not qualify.     Additional Screening:  Hepatitis C Screening: Completed 08/10/2019  Vision Screening: Recommended annual ophthalmology exams for early detection of glaucoma and other disorders of the eye. Is the patient up to date with their annual eye exam?  No  Who is the provider or what is the name of the office in which the patient attends annual eye exams? Unknown-Patient advised to make an appt   Dental Screening: Recommended annual dental exams for proper oral hygiene  Community Resource Referral / Chronic Care Management: CRR required this visit?  No   CCM required this visit?  No      Plan:     I have personally reviewed and noted the following in the patient's chart:   Medical and social history Use of alcohol, tobacco or illicit drugs  Current medications and supplements including opioid prescriptions.  Functional ability and status Nutritional status Physical activity Advanced directives List of other physicians Hospitalizations, surgeries, and ER visits in previous 12 months Vitals Screenings to include cognitive, depression, and falls Referrals and appointments  In addition, I have reviewed and discussed with patient certain preventive protocols, quality metrics, and best practice recommendations. A written personalized care plan for preventive services as well as general preventive health recommendations were provided to patient.     Marta Antu, LPN   [id number]  Nurse Health Advisor  Nurse Notes: None

## 2021-06-07 ENCOUNTER — Other Ambulatory Visit: Payer: Self-pay

## 2021-06-07 ENCOUNTER — Ambulatory Visit: Payer: Medicare Other | Admitting: *Deleted

## 2021-06-07 DIAGNOSIS — Z4802 Encounter for removal of sutures: Secondary | ICD-10-CM

## 2021-06-07 NOTE — Progress Notes (Signed)
Here for suture removal- no sign or symptoms of infection- pathology to patient.

## 2021-06-08 ENCOUNTER — Ambulatory Visit: Payer: Medicare Other

## 2021-06-12 ENCOUNTER — Ambulatory Visit: Payer: Medicare Other

## 2021-06-28 ENCOUNTER — Encounter: Payer: Self-pay | Admitting: Family

## 2021-06-28 ENCOUNTER — Telehealth: Payer: Self-pay | Admitting: Family

## 2021-06-30 ENCOUNTER — Ambulatory Visit (INDEPENDENT_AMBULATORY_CARE_PROVIDER_SITE_OTHER): Payer: Medicare PPO | Admitting: Family

## 2021-06-30 VITALS — BP 151/83 | HR 88 | Temp 98.5°F | Resp 16 | Wt 223.0 lb

## 2021-06-30 DIAGNOSIS — H6123 Impacted cerumen, bilateral: Secondary | ICD-10-CM | POA: Diagnosis not present

## 2021-06-30 DIAGNOSIS — R252 Cramp and spasm: Secondary | ICD-10-CM

## 2021-06-30 DIAGNOSIS — I25118 Atherosclerotic heart disease of native coronary artery with other forms of angina pectoris: Secondary | ICD-10-CM | POA: Diagnosis not present

## 2021-06-30 DIAGNOSIS — R0789 Other chest pain: Secondary | ICD-10-CM | POA: Diagnosis not present

## 2021-06-30 DIAGNOSIS — R0602 Shortness of breath: Secondary | ICD-10-CM | POA: Diagnosis not present

## 2021-06-30 LAB — BASIC METABOLIC PANEL
BUN: 16 mg/dL (ref 6–23)
CO2: 27 mEq/L (ref 19–32)
Calcium: 9.3 mg/dL (ref 8.4–10.5)
Chloride: 105 mEq/L (ref 96–112)
Creatinine, Ser: 0.76 mg/dL (ref 0.40–1.20)
GFR: 83.76 mL/min (ref >60.00)
Glucose, Bld: 116 mg/dL — ABNORMAL HIGH (ref 70–99)
Potassium: 4.3 mEq/L (ref 3.5–5.1)
Sodium: 141 mEq/L (ref 135–145)

## 2021-06-30 LAB — MAGNESIUM: Magnesium: 2.2 mg/dL (ref 1.5–2.5)

## 2021-06-30 NOTE — Progress Notes (Signed)
Subjective:     Patient ID: [name], female    DOB: 11-30-1960, 61 y.o.   MRN: [id number]  Chief Complaint  Patient presents with   Ear Fullness    Complains of bilateral ear fullness f   Shortness of Breath    Complains of SOB    Chest Pain    Complains of chest pain   Leg Pain    Complains of bilateral leg pain below the knee    Ear Fullness   Shortness of Breath Associated symptoms include chest pain and leg pain.  Chest Pain  Associated symptoms include leg pain and shortness of breath.  Leg Pain    Patient had cardiac cath 2019 which noted the following:    1. Single vessel obstructive CAD with 75% mid PDA- this is a small vessel. 2. Focal wall motion abnormality involving the basal to mid anterior wall. Overall EF 50% 3. Normal LVEDP.  ificant CAD to explain clinical findings and wall motion abnormality of the anterior wall. Consideration for possible coronary spasm or atypical stress induced cardiomyopathy. Recommend medical management.   Her last visit with cardiology was 08/10/19. Reports that she lays on 3 pillow at night. Increased SOB at rest and at with exertion.   Reports increased fatigue, using nitro more recently which does relieve her chest pain.  She notes that she has been having aching in both of calfs, happens any time not necessarily with exercise.     Hyperlipidemia-  Lab Results  Component Value Date   CHOL 204 (H) 05/26/2021   HDL 69.90 05/26/2021   LDLCALC 113 (H) 05/26/2021   LDLDIRECT 76 07/21/2019   TRIG 107.0 05/26/2021   CHOLHDL 3 05/26/2021    Health Maintenance Due  Topic Date Due   URINE MICROALBUMIN  02/07/2021   TETANUS/TDAP  04/29/2021    Past Medical History:  Diagnosis Date   Allergy    allergic rhinitis   Arthritis    Atypical chest pain    Constipation    Cyst, ovarian    Diabetes mellitus, type II (HCC)    Elevated alkaline phosphatase level    Endometriosis    Esophageal stricture    Fatty liver     Gastroparesis    GERD (gastroesophageal reflux disease)    Heart murmur    Hepatic hemangioma    Hyperlipidemia    Hypertension    Hypertrophic condition of skin    acrokeratoelastoidosis- s/p 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 1/08- benign   Iron deficiency anemia    Migraine headache    Non-compliance    Peptic ulcer disease    Thyroid disease     Past Surgical History:  Procedure Laterality Date   ABDOMINAL EXPLORATION SURGERY     w/bso    BREAST BIOPSY     CARDIAC CATHETERIZATION  2009   mild non obstructive CAD   CHOLECYSTECTOMY     COLONOSCOPY     ESOPHAGEAL MANOMETRY N/A 08/01/2020   Procedure: ESOPHAGEAL MANOMETRY (EM);  Surgeon: Yetta Flock, MD;  Location: [facility];  Service: Gastroenterology;  Laterality: N/A;   KNEE SURGERY  2005    left knee   LEFT HEART CATH AND CORONARY ANGIOGRAPHY N/A 07/17/2017   Procedure: LEFT HEART CATH AND CORONARY ANGIOGRAPHY;  Surgeon: Martinique, Peter M, MD;  Location: [facility];  Service: Cardiovascular;  Laterality: N/A;   POLYPECTOMY     TOTAL ABDOMINAL HYSTERECTOMY     UPPER GASTROINTESTINAL ENDOSCOPY      Family  History  Problem Relation Age of Onset   Hypertension Mother    Rheum arthritis Mother    Hypertension Father    Diabetes Father    Prostate cancer Father    Kidney disease Father    Diabetes Sister    Fibromyalgia Sister    Diabetes Maternal Grandmother    Lung cancer Maternal Grandfather    Arthritis Brother    Migraines Brother    CAD Brother    Fibromyalgia Sister    Migraines Sister    Colon cancer Neg Hx    Thyroid disease Neg Hx    Colon polyps Neg Hx     Social History   Socioeconomic History   Marital status: Widowed    Spouse name: Not on file   Number of children: 2   Years of education: Not on file   Highest education level: Not on file  Occupational History   Occupation: DISABILITY    Employer: UNEMPLOYED  Tobacco Use   Smoking status: Former    Packs/day: 0.50    Years: 18.00     Pack years: 9.00    Types: Cigarettes    Start date: 07/29/1977    Quit date: 06/25/1994    Years since quitting: 27.0   Smokeless tobacco: Never   Tobacco comments:    quit 19 years ago  Vaping Use   Vaping Use: Never used  Substance and Sexual Activity   Alcohol use: Yes    Alcohol/week: 0.0 standard drinks    Comment: social drinker   Drug use: No   Sexual activity: Not Currently  Other Topics Concern   Not on file  Social History Narrative   Widowed   Has 2 grown children.  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    Former Smoker Quit tobacco in 1996.  She was a pack a day smoker for approximately 10 years.    Alcohol use-yes: Social    Daily Caffeine Use:6 pack of pepsi daily     Illicit Drug Use - no    Patient does not get regular exercise.       Smoking Status:  quit   Social Determinants of Health   Financial Resource Strain: Low Risk    Difficulty of Paying Living Expenses: Not hard at all  Food Insecurity: No Food Insecurity   Worried About Charity fundraiser in the Last Year: Never true   [place] in the Last Year: Never true  Transportation Needs: No Transportation Needs   Lack of Transportation (Medical): No   Lack of Transportation (Non-Medical): No  Physical Activity: Insufficiently Active   Days of Exercise per Week: 2 days   Minutes of Exercise per Session: 30 min  Stress: No Stress Concern Present   Feeling of Stress : Not at all  Social Connections: Moderately Isolated   Frequency of Communication with Friends and Family: More than three times a week   Frequency of Social Gatherings with Friends and Family: More than three times a week   Attends Religious Services: More than 4 times per year   Active Member of [employer] or Organizations: No   Attends Archivist Meetings: Never   Marital Status: Widowed  Human resources officer Violence: Not At Risk   Fear of Current or Ex-Partner: No   Emotionally  Abused: No   Physically Abused: No   Sexually Abused: No    Outpatient Medications Prior to Visit  Medication Sig  Dispense Refill   Albuterol Sulfate 108 (90 Base) MCG/ACT AEPB Inhale 2 puffs into the lungs 4 (four) times daily. 1 each 3   alclomethasone (ACLOVATE) 0.05 % cream Apply topically 2 (two) times daily as needed (Rash). 180 g 3   Alirocumab (PRALUENT) 150 MG/ML SOAJ Inject 1 pen into the skin every 14 (fourteen) days. 2 mL 11   amLODipine (NORVASC) 10 MG tablet Take 1 tablet by mouth once daily 90 tablet 1   aspirin 81 MG chewable tablet Chew 1 tablet (81 mg total) by mouth daily.     clobetasol (TEMOVATE) 0.05 % external solution Apply 1 application topically 2 (two) times daily. 50 mL 6   clotrimazole-betamethasone (LOTRISONE) cream Apply 1 application topically 2 (two) times daily. 30 g 0   fluconazole (DIFLUCAN) 150 MG tablet One tablet by mouth once weekly for 3 weeks. 3 tablet 0   fluticasone (FLONASE) 50 MCG/ACT nasal spray Place 2 sprays into both nostrils daily. 16 g 1   imipramine (TOFRANIL) 25 MG tablet Take 1 tablet (25 mg total) by mouth at bedtime. 30 tablet 3   isosorbide mononitrate (IMDUR) 60 MG 24 hr tablet Take 1 tablet (60 mg total) by mouth daily. Please keep upcoming appt. In March in order to receive future refills.Thank You. 90 tablet 0   loratadine (CLARITIN) 10 MG tablet Take 1 tablet (10 mg total) by mouth daily. 10 tablet 0   methimazole (TAPAZOLE) 5 MG tablet Take 1 tablet by mouth once daily 90 tablet 0   metoprolol succinate (TOPROL-XL) 50 MG 24 hr tablet Take 1 tablet (50 mg total) by mouth daily. Take with or immediately following a meal. 90 tablet 3   nitroGLYCERIN (NITROSTAT) 0.4 MG SL tablet Place 1 tablet (0.4 mg total) under the tongue every 5 (five) minutes as needed for chest pain. 25 tablet 3   nystatin (MYCOSTATIN/NYSTOP) powder Apply 1 application topically 2 (two) times daily. Beneath both breasts 60 g 1   ondansetron (ZOFRAN) 4 MG tablet  Take 1 tab by mouth every 6 hours as needed for nausea. 100 tablet 1   potassium chloride SA (KLOR-CON) 20 MEQ tablet Take 1 tablet (20 mEq total) by mouth daily. Please keep upcoming appt. In March in order to receive future refills.Thank You. 90 tablet 0   rosuvastatin (CRESTOR) 20 MG tablet Take 1.5 tablets (30 mg total) by mouth daily. 135 tablet 1   SUMAtriptan (IMITREX) 50 MG tablet Take 1 tablet (50 mg total) by mouth every 2 (two) hours as needed for migraine. May repeat in 2 hours if headache persists or recurs. 10 tablet 5   tretinoin (RETIN-A) 0.1 % cream Apply topically at bedtime. 45 g 6   Vitamin D, Ergocalciferol, (DRISDOL) 1.25 MG (50000 UNIT) CAPS capsule Take 1 capsule (50,000 Units total) by mouth every 7 (seven) days. 6 capsule 0   No facility-administered medications prior to visit.    Allergies  Allergen Reactions   Ace Inhibitors     REACTION: chronic cough   Celebrex [Celecoxib] Other (See Comments)    "makes me bleed"   Diovan [Valsartan]     angioedema    Review of Systems  Respiratory:  Positive for shortness of breath.   Cardiovascular:  Positive for chest pain.      Objective:    Physical Exam Constitutional:      General: She is not in acute distress.    Appearance: Normal appearance. She is well-developed.  HENT:  Head: Normocephalic and atraumatic.     Right Ear: External ear normal.     Left Ear: External ear normal.  Eyes:     General: No scleral icterus. Neck:     Thyroid: No thyromegaly.  Cardiovascular:     Rate and Rhythm: Normal rate and regular rhythm.     Pulses:          Popliteal pulses are 2+ on the right side and 2+ on the left side.       Dorsalis pedis pulses are 2+ on the right side and 2+ on the left side.     Heart sounds: Normal heart sounds. No murmur heard. Pulmonary:     Effort: Pulmonary effort is normal. No respiratory distress.     Breath sounds: Normal breath sounds. No wheezing.  Musculoskeletal:      Cervical back: Neck supple.  Skin:    General: Skin is warm and dry.  Neurological:     Mental Status: She is alert and oriented to person, place, and time.  Psychiatric:        Mood and Affect: Mood normal.        Behavior: Behavior normal.        Thought Content: Thought content normal.        Judgment: Judgment normal.    BP (!) 151/83 (BP Location: Right Arm, Patient Position: Sitting, Cuff Size: Small)    Pulse 88    Temp 98.5 F (36.9 C) (Oral)    Resp 16    Wt 223 lb (101.2 kg)    SpO2 99%    BMI 37.11 kg/m  Wt Readings from Last 3 Encounters:  06/30/21 223 lb (101.2 kg)  06/02/21 224 lb 9.6 oz (101.9 kg)  05/26/21 224 lb 6.4 oz (101.8 kg)       Assessment & Plan:   Problem List Items Addressed This Visit       Unprioritized   Coronary artery disease of native heart with stable angina pectoris ([place])    EKG tracing is personally reviewed.  EKG notes NSR.  No acute changes.   Will arrange follow up with her cardiologist.  Pt advised to go to the ED if she develops chest pain that is not relieved by nitroglycerine or if she develops increasing SOB.        Relevant Orders   Ambulatory referral to Cardiology   Bilateral impacted cerumen    New.  After verbal consent, ears were irrigated by CMA, pt tolerated procedure well.      Other Visit Diagnoses     SOB (shortness of breath)    -  Primary   Relevant Orders   EKG 12-Lead (Completed)   Ambulatory referral to Cardiology   Atypical chest pain       Relevant Orders   EKG 12-Lead (Completed)   Leg cramps       Relevant Orders   Basic metabolic panel   Magnesium       I am having [name] maintain her aspirin, Albuterol Sulfate, metoprolol succinate, Praluent, nitroGLYCERIN, nystatin, ondansetron, SUMAtriptan, loratadine, imipramine, fluticasone, alclomethasone, clobetasol, amLODipine, Vitamin D (Ergocalciferol), clotrimazole-betamethasone, fluconazole, isosorbide mononitrate, potassium chloride SA,  methimazole, rosuvastatin, and tretinoin.  No orders of the defined types were placed in this encounter.

## 2021-06-30 NOTE — Patient Instructions (Signed)
Please complete lab work prior to leaving. We will work on getting you back in with Dr. Harrington Challenger for sooner follow up.

## 2021-06-30 NOTE — Assessment & Plan Note (Signed)
EKG tracing is personally reviewed.  EKG notes NSR.  No acute changes.   Will arrange follow up with her cardiologist.  Pt advised to go to the ED if she develops chest pain that is not relieved by nitroglycerine or if she develops increasing SOB.

## 2021-06-30 NOTE — Assessment & Plan Note (Signed)
New.  After verbal consent, ears were irrigated by CMA, pt tolerated procedure well.

## 2021-07-03 ENCOUNTER — Other Ambulatory Visit: Payer: Self-pay | Admitting: Internal Medicine

## 2021-07-14 ENCOUNTER — Encounter: Payer: Self-pay | Admitting: Family

## 2021-07-14 ENCOUNTER — Other Ambulatory Visit (INDEPENDENT_AMBULATORY_CARE_PROVIDER_SITE_OTHER): Payer: Medicare PPO

## 2021-07-14 DIAGNOSIS — E785 Hyperlipidemia, unspecified: Secondary | ICD-10-CM

## 2021-07-14 LAB — LIPID PANEL
Cholesterol: 179 mg/dL (ref 0–200)
HDL: 46.8 mg/dL (ref >39.00)
LDL Cholesterol: 111 mg/dL — ABNORMAL HIGH (ref 0–99)
NonHDL: 132.24
Total CHOL/HDL Ratio: 4
Triglycerides: 108 mg/dL (ref 0.0–149.0)
VLDL: 21.6 mg/dL (ref 0.0–40.0)

## 2021-07-16 ENCOUNTER — Encounter: Payer: Self-pay | Admitting: Family

## 2021-07-17 NOTE — Progress Notes (Signed)
Mailed out to pt 

## 2021-08-12 ENCOUNTER — Other Ambulatory Visit: Payer: Self-pay | Admitting: Internal Medicine

## 2021-08-12 ENCOUNTER — Other Ambulatory Visit: Payer: Self-pay | Admitting: Family

## 2021-08-12 ENCOUNTER — Other Ambulatory Visit: Payer: Self-pay | Admitting: Endocrinology

## 2021-08-14 ENCOUNTER — Encounter: Payer: Self-pay | Admitting: Family

## 2021-08-15 ENCOUNTER — Telehealth: Payer: Self-pay

## 2021-08-15 MED ORDER — REPATHA SURECLICK 140 MG/ML ~~LOC~~ SOAJ: 140.0000 mg | SUBCUTANEOUS | 11 refills | Status: DC

## 2021-08-15 NOTE — Telephone Encounter (Signed)
Opened in error

## 2021-08-15 NOTE — Telephone Encounter (Signed)
Lvm stating repatha approved, rx sent switch to repatha instead of praluent, and call back if any issue arises

## 2021-08-17 ENCOUNTER — Other Ambulatory Visit: Payer: Self-pay | Admitting: Family

## 2021-08-21 NOTE — Progress Notes (Addendum)
? ?Cardiology Office Note ? ? ?Date:  08/25/21 ? ?ID:  [name], DOB 1961/04/18, MRN [id number] ? ?PCP:  [name], NP  ?Cardiologist:   [name], MD  ? ?Pt presents for f/u of CAD  ?  ?History of Present Illness: ?[name] is a 61 y.o. female with a hx of CP and CAD   ?Also hx of esophageal strictures   In Jan 2019  with CP and vomiting  that occurred after outpt EGD/dilitation  Troponin was elevated  She went on to cardiac cath in 2019 which showed 75% mid PDA (small vessel)  Prox RCA with 25%  OM 50%  LVEF normal  Focal wall motion abnormality  ? Effect of spasm   ? ? I saw her in Dec 202  The patient says since seen she has had more heart racing     She also says that she is more tired,  Getting more chest tightness  Last week had to take 3 NTG   Notes increased SOB with activity     ?Current Meds  ?Medication Sig  ? albuterol (VENTOLIN HFA) 108 (90 Base) MCG/ACT inhaler INHALE 2 PUFFS BY MOUTH 4 TIMES DAILY  ? alclomethasone (ACLOVATE) 0.05 % cream Apply topically 2 (two) times daily as needed (Rash).  ? amLODipine (NORVASC) 10 MG tablet Take 1 tablet by mouth once daily  ? aspirin 81 MG chewable tablet Chew 1 tablet (81 mg total) by mouth daily.  ? clobetasol (TEMOVATE) 0.05 % external solution Apply 1 application topically 2 (two) times daily.  ? clotrimazole-betamethasone (LOTRISONE) cream Apply 1 application topically 2 (two) times daily.  ? Evolocumab (REPATHA SURECLICK) 366 MG/ML SOAJ Inject 140 mg into the skin every 14 (fourteen) days.  ? fluconazole (DIFLUCAN) 150 MG tablet One tablet by mouth once weekly for 3 weeks.  ? fluticasone (FLONASE) 50 MCG/ACT nasal spray Place 2 sprays into both nostrils daily.  ? imipramine (TOFRANIL) 25 MG tablet Take 1 tablet (25 mg total) by mouth at bedtime.  ? isosorbide mononitrate (IMDUR) 60 MG 24 hr tablet Take 1 tablet (60 mg total) by mouth daily.  ? loratadine (CLARITIN) 10 MG tablet Take 1 tablet (10 mg total) by mouth daily.  ?  methimazole (TAPAZOLE) 5 MG tablet Take 1 tablet by mouth once daily  ? metoprolol succinate (TOPROL-XL) 50 MG 24 hr tablet Take 1 tablet (50 mg total) by mouth daily. Take with or immediately following a meal.  ? nitroGLYCERIN (NITROSTAT) 0.4 MG SL tablet Place 1 tablet (0.4 mg total) under the tongue every 5 (five) minutes as needed for chest pain.  ? nystatin (MYCOSTATIN/NYSTOP) powder Apply 1 application topically 2 (two) times daily. Beneath both breasts  ? ondansetron (ZOFRAN) 4 MG tablet Take 1 tab by mouth every 6 hours as needed for nausea.  ? potassium chloride SA (KLOR-CON M20) 20 MEQ tablet Take 1 tablet (20 mEq total) by mouth daily.  ? rosuvastatin (CRESTOR) 20 MG tablet Take 1.5 tablets (30 mg total) by mouth daily.  ? SUMAtriptan (IMITREX) 50 MG tablet Take 1 tablet (50 mg total) by mouth every 2 (two) hours as needed for migraine. May repeat in 2 hours if headache persists or recurs.  ? tretinoin (RETIN-A) 0.1 % cream Apply topically at bedtime.  ? Vitamin D, Ergocalciferol, (DRISDOL) 1.25 MG (50000 UNIT) CAPS capsule Take 1 capsule (50,000 Units total) by mouth every 7 (seven) days.  ? ? ? ?Allergies:   Ace inhibitors, Celebrex [celecoxib], and  Diovan [valsartan]  ? ?Past Medical History:  ?Diagnosis Date  ? Allergy   ? allergic rhinitis  ? Arthritis   ? Atypical chest pain   ? Constipation   ? Cyst, ovarian   ? Diabetes mellitus, type II ([place])   ? Elevated alkaline phosphatase level   ? Endometriosis   ? Esophageal stricture   ? Fatty liver   ? Gastroparesis   ? GERD (gastroesophageal reflux disease)   ? Heart murmur   ? Hepatic hemangioma   ? Hyperlipidemia   ? Hypertension   ? Hypertrophic condition of skin   ? 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 Iron deficiency anemia   ? Migraine headache   ? Non-compliance   ? Peptic ulcer disease   ? Thyroid disease   ? ? ?Past Surgical History:  ?Procedure Laterality Date  ? ABDOMINAL EXPLORATION SURGERY    ? w/bso   ? BREAST BIOPSY     ? CARDIAC CATHETERIZATION  2009  ? mild non obstructive CAD  ? CHOLECYSTECTOMY    ? COLONOSCOPY    ? ESOPHAGEAL MANOMETRY N/A 08/01/2020  ? Procedure: ESOPHAGEAL MANOMETRY (EM);  Surgeon: Yetta Flock, MD;  Location: [facility];  Service: Gastroenterology;  Laterality: N/A;  ? KNEE SURGERY  2005   ? left knee  ? LEFT HEART CATH AND CORONARY ANGIOGRAPHY N/A 07/17/2017  ? Procedure: LEFT HEART CATH AND CORONARY ANGIOGRAPHY;  Surgeon: Martinique, Peter M, MD;  Location: [facility];  Service: Cardiovascular;  Laterality: N/A;  ? POLYPECTOMY    ? TOTAL ABDOMINAL HYSTERECTOMY    ? UPPER GASTROINTESTINAL ENDOSCOPY    ? ? ? ?Social History:  The patient  reports that she quit smoking about 27 years ago. Her smoking use included cigarettes. She started smoking about 44 years ago. She has a 9.00 pack-year smoking history. She has never used smokeless tobacco. She reports current alcohol use. She reports that she does not use drugs.  ? ?Family History:  The patient's family history includes Arthritis in her brother; CAD in her brother; Diabetes in her father, maternal grandmother, and sister; Fibromyalgia in her sister and sister; Hypertension in her father and mother; Kidney disease in her father; Lung cancer in her maternal grandfather; Migraines in her brother and sister; Prostate cancer in her father; Rheum arthritis in her mother.  ? ? ?ROS:  Please see the history of present illness. All other systems are reviewed and  Negative to the above problem except as noted.  ? ? ?PHYSICAL EXAM: ?VS:  BP 133/78   Pulse 79   Ht 5\' 5"  (1.651 m)   Wt 222 lb 12.8 oz (101.1 kg)   BMI 37.08 kg/m?   ?GEN: Morbidly obese 61 yo in no acute distress  ?HEENT: normal  ?Neck: no JVD ?Cardiac: RRR; no murmurs  No LE edema    ?Respiratory:  clear to auscultation bilaterally ?GI: soft, nontender, nondistended, + BS  No hepatomegaly  ?MS: no deformity Moving all extremities   ?Skin: warm and dry, no rash ?Neuro:  Strength and  sensation are intact ?Psych: euthymic mood, full affect ? ? ?EKG:  EKG is ordered   SR 76 bpm  ? ? ?Lipid Panel ?   ?Component Value Date/Time  ? CHOL 179 07/14/2021 0750  ? CHOL 113 05/27/2020 1038  ? TRIG 108.0 07/14/2021 0750  ? TRIG 103 04/30/2006 0949  ? HDL 46.80 07/14/2021 0750  ? HDL 54 05/27/2020 1038  ? CHOLHDL 4 07/14/2021  0750  ? VLDL 21.6 07/14/2021 0750  ? [place] 111 (H) 07/14/2021 0750  ? LDLCALC 40 05/27/2020 1038  ? LDLDIRECT 76 07/21/2019 0756  ? LDLDIRECT 131.0 10/31/2016 1555  ? ?  ? ?Wt Readings from Last 3 Encounters:  ?08/25/21 223 lb (101.2 kg)  ?08/25/21 222 lb 12.8 oz (101.1 kg)  ?06/30/21 223 lb (101.2 kg)  ?  ? ? ?ASSESSMENT AND PLAN: ? ?1  CAD   Concerning    She does have a Hx GI problmes Had EGD/dilation in past .   SOme of symptoms may be GI   BUt, could be cardiac in origin     Need to define    I would recomm L heart cath to define anatomy    ?I would recomm L heart cath to redefine     Risks / benefits described     Pt understands and agrees to proced   ? ?2  HTN   Fiar   Follow  ? ?3  HL   On Repatha now  LDL higher than expected   No changes for now  ? ? ?4  GI   Followed by [facility]    Will probably need repeat EGD   ? ?Current medicines are reviewed at length with the patient today.  The patient does not have con ?Will get blood ork today      cerns regarding medicines. ? ?Signed, ?[name], MD  ?08/28/2021 12:16 AM    ?[facility] ?[address], [place], [place]  [address] ?Phone: [phone]; Fax: [phone]  ? ? ?

## 2021-08-21 NOTE — H&P (View-Only) (Signed)
? ?Cardiology Office Note ? ? ?Date:  08/25/21 ? ?ID:  [name], DOB 21-Jul-1960, MRN [id number] ? ?PCP:  [name], NP  ?Cardiologist:   [name], MD  ? ?Pt presents for f/u of CAD  ?  ?History of Present Illness: ?[name] is a 61 y.o. female with a hx of CP and CAD   ?Also hx of esophageal strictures   In Jan 2019  with CP and vomiting  that occurred after outpt EGD/dilitation  Troponin was elevated  She went on to cardiac cath in 2019 which showed 75% mid PDA (small vessel)  Prox RCA with 25%  OM 50%  LVEF normal  Focal wall motion abnormality  ? Effect of spasm   ? ? I saw her in Dec 202  The patient says since seen she has had more heart racing     She also says that she is more tired,  Getting more chest tightness  Last week had to take 3 NTG   Notes increased SOB with activity     ?Current Meds  ?Medication Sig  ? albuterol (VENTOLIN HFA) 108 (90 Base) MCG/ACT inhaler INHALE 2 PUFFS BY MOUTH 4 TIMES DAILY  ? alclomethasone (ACLOVATE) 0.05 % cream Apply topically 2 (two) times daily as needed (Rash).  ? amLODipine (NORVASC) 10 MG tablet Take 1 tablet by mouth once daily  ? aspirin 81 MG chewable tablet Chew 1 tablet (81 mg total) by mouth daily.  ? clobetasol (TEMOVATE) 0.05 % external solution Apply 1 application topically 2 (two) times daily.  ? clotrimazole-betamethasone (LOTRISONE) cream Apply 1 application topically 2 (two) times daily.  ? Evolocumab (REPATHA SURECLICK) 073 MG/ML SOAJ Inject 140 mg into the skin every 14 (fourteen) days.  ? fluconazole (DIFLUCAN) 150 MG tablet One tablet by mouth once weekly for 3 weeks.  ? fluticasone (FLONASE) 50 MCG/ACT nasal spray Place 2 sprays into both nostrils daily.  ? imipramine (TOFRANIL) 25 MG tablet Take 1 tablet (25 mg total) by mouth at bedtime.  ? isosorbide mononitrate (IMDUR) 60 MG 24 hr tablet Take 1 tablet (60 mg total) by mouth daily.  ? loratadine (CLARITIN) 10 MG tablet Take 1 tablet (10 mg total) by mouth daily.  ?  methimazole (TAPAZOLE) 5 MG tablet Take 1 tablet by mouth once daily  ? metoprolol succinate (TOPROL-XL) 50 MG 24 hr tablet Take 1 tablet (50 mg total) by mouth daily. Take with or immediately following a meal.  ? nitroGLYCERIN (NITROSTAT) 0.4 MG SL tablet Place 1 tablet (0.4 mg total) under the tongue every 5 (five) minutes as needed for chest pain.  ? nystatin (MYCOSTATIN/NYSTOP) powder Apply 1 application topically 2 (two) times daily. Beneath both breasts  ? ondansetron (ZOFRAN) 4 MG tablet Take 1 tab by mouth every 6 hours as needed for nausea.  ? potassium chloride SA (KLOR-CON M20) 20 MEQ tablet Take 1 tablet (20 mEq total) by mouth daily.  ? rosuvastatin (CRESTOR) 20 MG tablet Take 1.5 tablets (30 mg total) by mouth daily.  ? SUMAtriptan (IMITREX) 50 MG tablet Take 1 tablet (50 mg total) by mouth every 2 (two) hours as needed for migraine. May repeat in 2 hours if headache persists or recurs.  ? tretinoin (RETIN-A) 0.1 % cream Apply topically at bedtime.  ? Vitamin D, Ergocalciferol, (DRISDOL) 1.25 MG (50000 UNIT) CAPS capsule Take 1 capsule (50,000 Units total) by mouth every 7 (seven) days.  ? ? ? ?Allergies:   Ace inhibitors, Celebrex [celecoxib], and  Diovan [valsartan]  ? ?Past Medical History:  ?Diagnosis Date  ? Allergy   ? allergic rhinitis  ? Arthritis   ? Atypical chest pain   ? Constipation   ? Cyst, ovarian   ? Diabetes mellitus, type II ([place])   ? Elevated alkaline phosphatase level   ? Endometriosis   ? Esophageal stricture   ? Fatty liver   ? Gastroparesis   ? GERD (gastroesophageal reflux disease)   ? Heart murmur   ? Hepatic hemangioma   ? Hyperlipidemia   ? Hypertension   ? Hypertrophic condition of skin   ? 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 Iron deficiency anemia   ? Migraine headache   ? Non-compliance   ? Peptic ulcer disease   ? Thyroid disease   ? ? ?Past Surgical History:  ?Procedure Laterality Date  ? ABDOMINAL EXPLORATION SURGERY    ? w/bso   ? BREAST BIOPSY     ? CARDIAC CATHETERIZATION  2009  ? mild non obstructive CAD  ? CHOLECYSTECTOMY    ? COLONOSCOPY    ? ESOPHAGEAL MANOMETRY N/A 08/01/2020  ? Procedure: ESOPHAGEAL MANOMETRY (EM);  Surgeon: Yetta Flock, MD;  Location: [facility];  Service: Gastroenterology;  Laterality: N/A;  ? KNEE SURGERY  2005   ? left knee  ? LEFT HEART CATH AND CORONARY ANGIOGRAPHY N/A 07/17/2017  ? Procedure: LEFT HEART CATH AND CORONARY ANGIOGRAPHY;  Surgeon: Martinique, Peter M, MD;  Location: [facility];  Service: Cardiovascular;  Laterality: N/A;  ? POLYPECTOMY    ? TOTAL ABDOMINAL HYSTERECTOMY    ? UPPER GASTROINTESTINAL ENDOSCOPY    ? ? ? ?Social History:  The patient  reports that she quit smoking about 27 years ago. Her smoking use included cigarettes. She started smoking about 44 years ago. She has a 9.00 pack-year smoking history. She has never used smokeless tobacco. She reports current alcohol use. She reports that she does not use drugs.  ? ?Family History:  The patient's family history includes Arthritis in her brother; CAD in her brother; Diabetes in her father, maternal grandmother, and sister; Fibromyalgia in her sister and sister; Hypertension in her father and mother; Kidney disease in her father; Lung cancer in her maternal grandfather; Migraines in her brother and sister; Prostate cancer in her father; Rheum arthritis in her mother.  ? ? ?ROS:  Please see the history of present illness. All other systems are reviewed and  Negative to the above problem except as noted.  ? ? ?PHYSICAL EXAM: ?VS:  BP 133/78   Pulse 79   Ht 5\' 5"  (1.651 m)   Wt 222 lb 12.8 oz (101.1 kg)   BMI 37.08 kg/m?   ?GEN: Morbidly obese 61 yo in no acute distress  ?HEENT: normal  ?Neck: no JVD ?Cardiac: RRR; no murmurs  No LE edema    ?Respiratory:  clear to auscultation bilaterally ?GI: soft, nontender, nondistended, + BS  No hepatomegaly  ?MS: no deformity Moving all extremities   ?Skin: warm and dry, no rash ?Neuro:  Strength and  sensation are intact ?Psych: euthymic mood, full affect ? ? ?EKG:  EKG is ordered   SR 76 bpm  ? ? ?Lipid Panel ?   ?Component Value Date/Time  ? CHOL 179 07/14/2021 0750  ? CHOL 113 05/27/2020 1038  ? TRIG 108.0 07/14/2021 0750  ? TRIG 103 04/30/2006 0949  ? HDL 46.80 07/14/2021 0750  ? HDL 54 05/27/2020 1038  ? CHOLHDL 4 07/14/2021  0750  ? VLDL 21.6 07/14/2021 0750  ? [place] 111 (H) 07/14/2021 0750  ? LDLCALC 40 05/27/2020 1038  ? LDLDIRECT 76 07/21/2019 0756  ? LDLDIRECT 131.0 10/31/2016 1555  ? ?  ? ?Wt Readings from Last 3 Encounters:  ?08/25/21 223 lb (101.2 kg)  ?08/25/21 222 lb 12.8 oz (101.1 kg)  ?06/30/21 223 lb (101.2 kg)  ?  ? ? ?ASSESSMENT AND PLAN: ? ?1  CAD   Concerning    She does have a Hx GI problmes Had EGD/dilation in past .   SOme of symptoms may be GI   BUt, could be cardiac in origin     Need to define    I would recomm L heart cath to define anatomy    ?I would recomm L heart cath to redefine     Risks / benefits described     Pt understands and agrees to proced   ? ?2  HTN   Fiar   Follow  ? ?3  HL   On Repatha now  LDL higher than expected   No changes for now  ? ? ?4  GI   Followed by [facility]    Will probably need repeat EGD   ? ?Current medicines are reviewed at length with the patient today.  The patient does not have con ?Will get blood ork today      cerns regarding medicines. ? ?Signed, ?[name], MD  ?08/28/2021 12:16 AM    ?[facility] ?[address], [place], [place]  [address] ?Phone: [phone]; Fax: [phone]  ? ? ?

## 2021-08-23 ENCOUNTER — Encounter: Payer: Self-pay | Admitting: Family

## 2021-08-23 ENCOUNTER — Telehealth: Payer: Self-pay | Admitting: Family

## 2021-08-23 NOTE — Telephone Encounter (Signed)
Pt states she's having cold symps and would like for CMA to return her call.  ? ?Please advise  ? ?[phone]  ?

## 2021-08-23 NOTE — Telephone Encounter (Signed)
Please schedule pt OV 

## 2021-08-23 NOTE — Telephone Encounter (Signed)
Pt sched 3/3 @ 140! ?

## 2021-08-25 ENCOUNTER — Encounter: Payer: Self-pay | Admitting: Family

## 2021-08-25 ENCOUNTER — Other Ambulatory Visit: Payer: Self-pay

## 2021-08-25 ENCOUNTER — Ambulatory Visit (INDEPENDENT_AMBULATORY_CARE_PROVIDER_SITE_OTHER): Payer: BC Managed Care – PPO | Admitting: Internal Medicine

## 2021-08-25 ENCOUNTER — Ambulatory Visit: Payer: Medicare PPO | Admitting: Gastroenterology

## 2021-08-25 ENCOUNTER — Encounter: Payer: Self-pay | Admitting: Internal Medicine

## 2021-08-25 ENCOUNTER — Ambulatory Visit (INDEPENDENT_AMBULATORY_CARE_PROVIDER_SITE_OTHER): Payer: Medicare Other | Admitting: Family

## 2021-08-25 VITALS — BP 133/78 | HR 79 | Ht 65.0 in | Wt 222.8 lb

## 2021-08-25 VITALS — BP 129/78 | HR 79 | Temp 98.9°F | Resp 16 | Wt 223.0 lb

## 2021-08-25 DIAGNOSIS — Z01810 Encounter for preprocedural cardiovascular examination: Secondary | ICD-10-CM

## 2021-08-25 DIAGNOSIS — R059 Cough, unspecified: Secondary | ICD-10-CM | POA: Diagnosis not present

## 2021-08-25 DIAGNOSIS — T85618A Breakdown (mechanical) of other specified internal prosthetic devices, implants and grafts, initial encounter: Secondary | ICD-10-CM | POA: Insufficient documentation

## 2021-08-25 LAB — BASIC METABOLIC PANEL
BUN/Creatinine Ratio: 13 (ref 12–28)
BUN: 10 mg/dL (ref 8–27)
CO2: 25 mmol/L (ref 20–29)
Calcium: 9.1 mg/dL (ref 8.7–10.3)
Chloride: 103 mmol/L (ref 96–106)
Creatinine, Ser: 0.77 mg/dL (ref 0.57–1.00)
Glucose: 104 mg/dL — ABNORMAL HIGH (ref 70–99)
Potassium: 4.2 mmol/L (ref 3.5–5.2)
Sodium: 141 mmol/L (ref 134–144)
eGFR: 87 mL/min/{1.73_m2} (ref >59)

## 2021-08-25 LAB — CBC
Hematocrit: 36.9 % (ref 34.0–46.6)
Hemoglobin: 11.2 g/dL (ref 11.1–15.9)
MCH: 22.2 pg — ABNORMAL LOW (ref 26.6–33.0)
MCHC: 30.4 g/dL — ABNORMAL LOW (ref 31.5–35.7)
MCV: 73 fL — ABNORMAL LOW (ref 79–97)
Platelets: 265 10*3/uL (ref 150–450)
RBC: 5.04 x10E6/uL (ref 3.77–5.28)
RDW: 13.4 % (ref 11.7–15.4)
WBC: 6.9 10*3/uL (ref 3.4–10.8)

## 2021-08-25 MED ORDER — BENZONATATE 100 MG PO CAPS: 100.0000 mg | ORAL_CAPSULE | Freq: Three times a day (TID) | ORAL | 0 refills | Status: DC | PRN

## 2021-08-25 NOTE — Assessment & Plan Note (Signed)
Likely cause for pressure right ear. Will refer back to ENT.  ?

## 2021-08-25 NOTE — Patient Instructions (Signed)
Medication Instructions:  ? ?*If you need a refill on your cardiac medications before your next appointment, please call your pharmacy* ? ? ?Lab Work: ?Art gallery manager and cbc  ?If you have labs (blood work) drawn today and your tests are completely normal, you will receive your results only by: ?MyChart Message (if you have MyChart) OR ?A paper copy in the mail ?If you have any lab test that is abnormal or we need to change your treatment, we will call you to review the results. ? ? ?Testing/Procedures: ? ?[facility] ?[place] [facility] ?[address], [address] ?[place] [address] ?Dept: [phone] ?Loc: [phone] ? ?[name]  08/25/2021 ? ?You are scheduled for a Cardiac Catheterization on Friday, March 10 with Dr. Lauree Chandler. ? ?1. Please arrive at the Main Entrance A at [facility]: [place], [place] [address] at 7:00 AM (This time is two hours before your procedure to ensure your preparation). Free valet parking service is available.  ? ?Special note: Every effort is made to have your procedure done on time. Please understand that emergencies sometimes delay scheduled procedures. ? ?2. Diet: Do not eat solid foods after midnight.  You may have clear liquids until 5 AM upon the day of the procedure. ? ?3. Labs: You will need to have blood drawn on Friday, March 3 at [facility] at [facility]. [address], [place]  ?Open: 7:30am - 5pm    Phone: [phone]. You do not need to be fasting. ? ?4. Medication instructions in preparation for your procedure: ? ? Contrast Allergy: No ? ?On the morning of your procedure, take Aspirin and any morning medicines NOT listed above.  You may use sips of water. ? ?5. Plan to go home the same day, you will only stay overnight if medically necessary. ?6. You MUST have a responsible adult to drive you home. ?7. An adult MUST be with you the first 24 hours  after you arrive home. ?8. Bring a current list of your medications, and the last time and date medication taken. ?9. Bring ID and current insurance cards. ?10.Please wear clothes that are easy to get on and off and wear slip-on shoes. ? ?Thank you for allowing [place] to care for you! ?  -- [facility] ? ? ? ?Follow-Up: ?At [facility], you and your health needs are our priority.  As part of our continuing mission to provide you with exceptional heart care, we have created designated Provider Care Teams.  These Care Teams include your primary Cardiologist (physician) and Advanced Practice Providers (APPs -  Physician Assistants and Nurse Practitioners) who all work together to provide you with the care you need, when you need it. ? ?We recommend signing up for the patient portal called "MyChart".  Sign up information is provided on this After Visit Summary.  MyChart is used to connect with patients for Virtual Visits (Telemedicine).  Patients are able to view lab/test results, encounter notes, upcoming appointments, etc.  Non-urgent messages can be sent to your provider as well.   ?To learn more about what you can do with MyChart, go to [email or web address].   ? ?

## 2021-08-25 NOTE — Patient Instructions (Addendum)
Please continue claritin 10mg  once daily. ?You may use tessalon 3 times daily as needed for cough. ?Call if cough worsens or if not improved in 1 week. ?You should be contacted about your referral back to ENT for your ear tube replacement.  ?

## 2021-08-25 NOTE — Assessment & Plan Note (Signed)
Please continue claritin 10mg  once daily. ?You may use tessalon 3 times daily as needed for cough. ?Call if cough worsens or if not improved in 1 week. ?

## 2021-08-25 NOTE — Progress Notes (Deleted)
Subjective:     Patient ID: [name], female    DOB: 04/09/1961, 61 y.o.   MRN: [id number]  Chief Complaint  Patient presents with   Cough    Complains of dry persistent cough   Ear Fullness    Complains of right ear fulness    HPI Patient is in today for follow up.   Health Maintenance Due  Topic Date Due   URINE MICROALBUMIN  02/07/2021   TETANUS/TDAP  04/29/2021    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/08- benign   Iron deficiency anemia    Migraine headache    Non-compliance    Peptic ulcer disease    Thyroid disease     Past Surgical History:  Procedure Laterality Date   ABDOMINAL EXPLORATION SURGERY     w/bso    BREAST BIOPSY     CARDIAC CATHETERIZATION  2009   mild non obstructive CAD   CHOLECYSTECTOMY     COLONOSCOPY     ESOPHAGEAL MANOMETRY N/A 08/01/2020   Procedure: ESOPHAGEAL MANOMETRY (EM);  Surgeon: Yetta Flock, MD;  Location: [facility];  Service: Gastroenterology;  Laterality: N/A;   KNEE SURGERY  2005    left knee   LEFT HEART CATH AND CORONARY ANGIOGRAPHY N/A 07/17/2017   Procedure: LEFT HEART CATH AND CORONARY ANGIOGRAPHY;  Surgeon: Martinique, Peter M, MD;  Location: [facility];  Service: Cardiovascular;  Laterality: N/A;   POLYPECTOMY     TOTAL ABDOMINAL HYSTERECTOMY     UPPER GASTROINTESTINAL ENDOSCOPY      Family History  Problem Relation Age of Onset   Hypertension Mother    Rheum arthritis Mother    Hypertension Father    Diabetes Father    Prostate cancer  Father    Kidney disease Father    Diabetes Sister    Fibromyalgia Sister    Diabetes Maternal Grandmother    Lung cancer Maternal Grandfather    Arthritis Brother    Migraines Brother    CAD Brother    Fibromyalgia Sister    Migraines Sister    Colon cancer Neg Hx    Thyroid disease Neg Hx    Colon polyps Neg Hx     Social History   Socioeconomic History   Marital status: Widowed    Spouse name: Not on file   Number of children: 2   Years of education: Not on file   Highest education level: Not on file  Occupational History   Occupation: DISABILITY    Employer: UNEMPLOYED  Tobacco Use   Smoking status: Former    Packs/day: 0.50    Years: 18.00    Pack years: 9.00    Types: Cigarettes    Start date: 07/29/1977    Quit date: 06/25/1994    Years since quitting: 27.1   Smokeless tobacco: Never   Tobacco comments:    quit 19 years ago  Vaping Use  Vaping Use: Never used  Substance and Sexual Activity   Alcohol use: Yes    Alcohol/week: 0.0 standard drinks    Comment: social drinker   Drug use: No   Sexual activity: Not Currently  Other Topics Concern   Not on file  Social History Narrative   Widowed   Has 2 grown children.  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    Former Smoker Quit tobacco in 1996.  She was a pack a day smoker for approximately 10 years.    Alcohol use-yes: Social    Daily Caffeine Use:6 pack of pepsi daily     Illicit Drug Use - no    Patient does not get regular exercise.       Smoking Status:  quit   Social Determinants of Health   Financial Resource Strain: Low Risk    Difficulty of Paying Living Expenses: Not hard at all  Food Insecurity: No Food Insecurity   Worried About Charity fundraiser in the Last Year: Never true   [place] in the Last Year: Never true  Transportation Needs: No Transportation Needs   Lack of Transportation (Medical): No   Lack of Transportation  (Non-Medical): No  Physical Activity: Insufficiently Active   Days of Exercise per Week: 2 days   Minutes of Exercise per Session: 30 min  Stress: No Stress Concern Present   Feeling of Stress : Not at all  Social Connections: Moderately Isolated   Frequency of Communication with Friends and Family: More than three times a week   Frequency of Social Gatherings with Friends and Family: More than three times a week   Attends Religious Services: More than 4 times per year   Active Member of [employer] or Organizations: No   Attends Archivist Meetings: Never   Marital Status: Widowed  Human resources officer Violence: Not At Risk   Fear of Current or Ex-Partner: No   Emotionally Abused: No   Physically Abused: No   Sexually Abused: No    Outpatient Medications Prior to Visit  Medication Sig Dispense Refill   albuterol (VENTOLIN HFA) 108 (90 Base) MCG/ACT inhaler INHALE 2 PUFFS BY MOUTH 4 TIMES DAILY 9 g 0   alclomethasone (ACLOVATE) 0.05 % cream Apply topically 2 (two) times daily as needed (Rash). 180 g 3   amLODipine (NORVASC) 10 MG tablet Take 1 tablet by mouth once daily 90 tablet 0   aspirin 81 MG chewable tablet Chew 1 tablet (81 mg total) by mouth daily.     clobetasol (TEMOVATE) 0.05 % external solution Apply 1 application topically 2 (two) times daily. 50 mL 6   clotrimazole-betamethasone (LOTRISONE) cream Apply 1 application topically 2 (two) times daily. 30 g 0   Evolocumab (REPATHA SURECLICK) 563 MG/ML SOAJ Inject 140 mg into the skin every 14 (fourteen) days. 2 mL 11   fluconazole (DIFLUCAN) 150 MG tablet One tablet by mouth once weekly for 3 weeks. 3 tablet 0   fluticasone (FLONASE) 50 MCG/ACT nasal spray Place 2 sprays into both nostrils daily. 16 g 1   imipramine (TOFRANIL) 25 MG tablet Take 1 tablet (25 mg total) by mouth at bedtime. 30 tablet 3   isosorbide mononitrate (IMDUR) 60 MG 24 hr tablet Take 1 tablet (60 mg total) by mouth daily. 30 tablet 0   loratadine  (CLARITIN) 10 MG tablet Take 1 tablet (10 mg total) by mouth daily. 10 tablet 0   methimazole (  TAPAZOLE) 5 MG tablet Take 1 tablet by mouth once daily 90 tablet 0   metoprolol succinate (TOPROL-XL) 50 MG 24 hr tablet Take 1 tablet (50 mg total) by mouth daily. Take with or immediately following a meal. 90 tablet 3   nitroGLYCERIN (NITROSTAT) 0.4 MG SL tablet Place 1 tablet (0.4 mg total) under the tongue every 5 (five) minutes as needed for chest pain. 25 tablet 3   nystatin (MYCOSTATIN/NYSTOP) powder Apply 1 application topically 2 (two) times daily. Beneath both breasts 60 g 1   ondansetron (ZOFRAN) 4 MG tablet Take 1 tab by mouth every 6 hours as needed for nausea. 100 tablet 1   potassium chloride SA (KLOR-CON M20) 20 MEQ tablet Take 1 tablet (20 mEq total) by mouth daily. 30 tablet 0   rosuvastatin (CRESTOR) 20 MG tablet Take 1.5 tablets (30 mg total) by mouth daily. 135 tablet 1   SUMAtriptan (IMITREX) 50 MG tablet Take 1 tablet (50 mg total) by mouth every 2 (two) hours as needed for migraine. May repeat in 2 hours if headache persists or recurs. 10 tablet 5   tretinoin (RETIN-A) 0.1 % cream Apply topically at bedtime. 45 g 6   Vitamin D, Ergocalciferol, (DRISDOL) 1.25 MG (50000 UNIT) CAPS capsule Take 1 capsule (50,000 Units total) by mouth every 7 (seven) days. 6 capsule 0   No facility-administered medications prior to visit.    Allergies  Allergen Reactions   Ace Inhibitors     REACTION: chronic cough   Celebrex [Celecoxib] Other (See Comments)    "makes me bleed"   Diovan [Valsartan]     angioedema    ROS     Objective:    Physical Exam  BP 129/78 (BP Location: Right Arm, Patient Position: Sitting, Cuff Size: Large)    Pulse 79    Temp 98.9 F (37.2 C) (Oral)    Resp 16    Wt 223 lb (101.2 kg)    SpO2 100%    BMI 37.11 kg/m  Wt Readings from Last 3 Encounters:  08/25/21 223 lb (101.2 kg)  08/25/21 222 lb 12.8 oz (101.1 kg)  06/30/21 223 lb (101.2 kg)        Assessment & Plan:   Problem List Items Addressed This Visit   None   I am having [name] maintain her aspirin, metoprolol succinate, nitroGLYCERIN, nystatin, ondansetron, SUMAtriptan, loratadine, imipramine, fluticasone, alclomethasone, clobetasol, Vitamin D (Ergocalciferol), clotrimazole-betamethasone, fluconazole, rosuvastatin, tretinoin, amLODipine, isosorbide mononitrate, methimazole, potassium chloride SA, Repatha SureClick, and albuterol.  No orders of the defined types were placed in this encounter.

## 2021-08-25 NOTE — Progress Notes (Signed)
Subjective:   By signing my name below, I, [name], attest that this documentation has been prepared under the direction and in the presence of Karie Chimera, NP 08/25/2021   Patient ID: [name], female    DOB: 11-02-1960, 61 y.o.   MRN: [id number]  Chief Complaint  Patient presents with   Cough    Complains of dry persistent cough   Ear Fullness    Complains of right ear fulness    Cough Associated symptoms include shortness of breath. Pertinent negatives include no fever.  Ear Fullness  Associated symptoms include coughing (Dry).  Patient is in today for an office visit.  Cough - Patient complains of a cough that started to appear last week. Her cough is described as dry. Patient denies fever.  Albuterol has not helped symptoms. She states that she has been using Claritin recently. Patient requests medication for cough.  Right ear - Patient also complains of feeling fluid in the ear.   Health Maintenance Due  Topic Date Due   URINE MICROALBUMIN  02/07/2021   TETANUS/TDAP  04/29/2021    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/08- benign   Iron deficiency anemia    Migraine headache    Non-compliance    Peptic ulcer disease    Thyroid disease     Past Surgical History:  Procedure Laterality Date   ABDOMINAL EXPLORATION SURGERY     w/bso    BREAST BIOPSY     CARDIAC CATHETERIZATION  2009   mild non obstructive CAD   CHOLECYSTECTOMY     COLONOSCOPY     ESOPHAGEAL MANOMETRY N/A 08/01/2020    Procedure: ESOPHAGEAL MANOMETRY (EM);  Surgeon: Yetta Flock, MD;  Location: [facility];  Service: Gastroenterology;  Laterality: N/A;   KNEE SURGERY  2005    left knee   LEFT HEART CATH AND CORONARY ANGIOGRAPHY N/A 07/17/2017   Procedure: LEFT HEART CATH AND CORONARY ANGIOGRAPHY;  Surgeon: Martinique, Peter M, MD;  Location: [facility];  Service: Cardiovascular;  Laterality: N/A;   POLYPECTOMY     TOTAL ABDOMINAL HYSTERECTOMY     UPPER GASTROINTESTINAL ENDOSCOPY      Family History  Problem Relation Age of Onset   Hypertension Mother    Rheum arthritis Mother    Hypertension Father    Diabetes Father    Prostate cancer Father    Kidney disease Father    Diabetes Sister    Fibromyalgia Sister    Diabetes Maternal Grandmother    Lung cancer Maternal Grandfather    Arthritis Brother    Migraines Brother    CAD Brother    Fibromyalgia Sister    Migraines Sister    Colon cancer Neg Hx    Thyroid disease Neg Hx    Colon polyps Neg Hx     Social History   Socioeconomic History   Marital status: Widowed  Spouse name: Not on file   Number of children: 2   Years of education: Not on file   Highest education level: Not on file  Occupational History   Occupation: DISABILITY    Employer: UNEMPLOYED  Tobacco Use   Smoking status: Former    Packs/day: 0.50    Years: 18.00    Pack years: 9.00    Types: Cigarettes    Start date: 07/29/1977    Quit date: 06/25/1994    Years since quitting: 27.1   Smokeless tobacco: Never   Tobacco comments:    quit 19 years ago  Vaping Use   Vaping Use: Never used  Substance and Sexual Activity   Alcohol use: Yes    Alcohol/week: 0.0 standard drinks    Comment: social drinker   Drug use: No   Sexual activity: Not Currently  Other Topics Concern   Not on file  Social History Narrative   Widowed   Has 2 grown children.  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    Former Smoker Quit  tobacco in 1996.  She was a pack a day smoker for approximately 10 years.    Alcohol use-yes: Social    Daily Caffeine Use:6 pack of pepsi daily     Illicit Drug Use - no    Patient does not get regular exercise.       Smoking Status:  quit   Social Determinants of Health   Financial Resource Strain: Low Risk    Difficulty of Paying Living Expenses: Not hard at all  Food Insecurity: No Food Insecurity   Worried About Charity fundraiser in the Last Year: Never true   [place] in the Last Year: Never true  Transportation Needs: No Transportation Needs   Lack of Transportation (Medical): No   Lack of Transportation (Non-Medical): No  Physical Activity: Insufficiently Active   Days of Exercise per Week: 2 days   Minutes of Exercise per Session: 30 min  Stress: No Stress Concern Present   Feeling of Stress : Not at all  Social Connections: Moderately Isolated   Frequency of Communication with Friends and Family: More than three times a week   Frequency of Social Gatherings with Friends and Family: More than three times a week   Attends Religious Services: More than 4 times per year   Active Member of [employer] or Organizations: No   Attends Archivist Meetings: Never   Marital Status: Widowed  Human resources officer Violence: Not At Risk   Fear of Current or Ex-Partner: No   Emotionally Abused: No   Physically Abused: No   Sexually Abused: No    Outpatient Medications Prior to Visit  Medication Sig Dispense Refill   albuterol (VENTOLIN HFA) 108 (90 Base) MCG/ACT inhaler INHALE 2 PUFFS BY MOUTH 4 TIMES DAILY 9 g 0   alclomethasone (ACLOVATE) 0.05 % cream Apply topically 2 (two) times daily as needed (Rash). 180 g 3   amLODipine (NORVASC) 10 MG tablet Take 1 tablet by mouth once daily 90 tablet 0   aspirin 81 MG chewable tablet Chew 1 tablet (81 mg total) by mouth daily.     clobetasol (TEMOVATE) 0.05 % external solution Apply 1 application topically 2 (two) times daily. 50  mL 6   clotrimazole-betamethasone (LOTRISONE) cream Apply 1 application topically 2 (two) times daily. 30 g 0   Evolocumab (REPATHA SURECLICK) 008 MG/ML SOAJ Inject 140 mg into the skin  every 14 (fourteen) days. 2 mL 11   fluconazole (DIFLUCAN) 150 MG tablet One tablet by mouth once weekly for 3 weeks. 3 tablet 0   fluticasone (FLONASE) 50 MCG/ACT nasal spray Place 2 sprays into both nostrils daily. 16 g 1   imipramine (TOFRANIL) 25 MG tablet Take 1 tablet (25 mg total) by mouth at bedtime. 30 tablet 3   isosorbide mononitrate (IMDUR) 60 MG 24 hr tablet Take 1 tablet (60 mg total) by mouth daily. 30 tablet 0   loratadine (CLARITIN) 10 MG tablet Take 1 tablet (10 mg total) by mouth daily. 10 tablet 0   methimazole (TAPAZOLE) 5 MG tablet Take 1 tablet by mouth once daily 90 tablet 0   metoprolol succinate (TOPROL-XL) 50 MG 24 hr tablet Take 1 tablet (50 mg total) by mouth daily. Take with or immediately following a meal. 90 tablet 3   nitroGLYCERIN (NITROSTAT) 0.4 MG SL tablet Place 1 tablet (0.4 mg total) under the tongue every 5 (five) minutes as needed for chest pain. 25 tablet 3   nystatin (MYCOSTATIN/NYSTOP) powder Apply 1 application topically 2 (two) times daily. Beneath both breasts 60 g 1   ondansetron (ZOFRAN) 4 MG tablet Take 1 tab by mouth every 6 hours as needed for nausea. 100 tablet 1   potassium chloride SA (KLOR-CON M20) 20 MEQ tablet Take 1 tablet (20 mEq total) by mouth daily. 30 tablet 0   rosuvastatin (CRESTOR) 20 MG tablet Take 1.5 tablets (30 mg total) by mouth daily. 135 tablet 1   SUMAtriptan (IMITREX) 50 MG tablet Take 1 tablet (50 mg total) by mouth every 2 (two) hours as needed for migraine. May repeat in 2 hours if headache persists or recurs. 10 tablet 5   tretinoin (RETIN-A) 0.1 % cream Apply topically at bedtime. 45 g 6   Vitamin D, Ergocalciferol, (DRISDOL) 1.25 MG (50000 UNIT) CAPS capsule Take 1 capsule (50,000 Units total) by mouth every 7 (seven) days. 6 capsule 0    No facility-administered medications prior to visit.    Allergies  Allergen Reactions   Ace Inhibitors     REACTION: chronic cough   Celebrex [Celecoxib] Other (See Comments)    "makes me bleed"   Diovan [Valsartan]     angioedema    Review of Systems  Constitutional:  Negative for fever.  HENT:         (+) Feeling of right ear fluid   Respiratory:  Positive for cough (Dry) and shortness of breath.       Objective:    Physical Exam Constitutional:      General: She is not in acute distress.    Appearance: Normal appearance. She is not ill-appearing.  HENT:     Head: Normocephalic and atraumatic.     Right Ear: External ear normal.     Left Ear: External ear normal.     Ears:     Comments: Right ear tube appears to have fell onto the ear canal. Eyes:     Extraocular Movements: Extraocular movements intact.     Pupils: Pupils are equal, round, and reactive to light.  Neck:     Thyroid: No thyroid tenderness.  Cardiovascular:     Rate and Rhythm: Normal rate and regular rhythm.     Heart sounds: Normal heart sounds. No murmur heard.   No gallop.  Pulmonary:     Effort: Pulmonary effort is normal. No respiratory distress.     Breath sounds: Normal breath sounds. No rales.  Lymphadenopathy:     Cervical: No cervical adenopathy.  Skin:    General: Skin is warm and dry.  Neurological:     Mental Status: She is alert and oriented to person, place, and time.  Psychiatric:        Judgment: Judgment normal.    BP 129/78 (BP Location: Right Arm, Patient Position: Sitting, Cuff Size: Large)    Pulse 79    Temp 98.9 F (37.2 C) (Oral)    Resp 16    Wt 223 lb (101.2 kg)    SpO2 100%    BMI 37.11 kg/m  Wt Readings from Last 3 Encounters:  08/25/21 223 lb (101.2 kg)  08/25/21 222 lb 12.8 oz (101.1 kg)  06/30/21 223 lb (101.2 kg)       Assessment & Plan:   Problem List Items Addressed This Visit       Unprioritized   Non-functioning tympanostomy tube - Primary     Likely cause for pressure right ear. Will refer back to ENT.       Relevant Orders   Ambulatory referral to ENT   Cough    Please continue claritin 10mg  once daily. You may use tessalon 3 times daily as needed for cough. Call if cough worsens or if not improved in 1 week.         Meds ordered this encounter  Medications   benzonatate (TESSALON) 100 MG capsule    Sig: Take 1 capsule (100 mg total) by mouth 3 (three) times daily as needed.    Dispense:  20 capsule    Refill:  0    Order Specific Question:   Supervising Provider    Answer:   Penni Homans A [[id number]]    I, [name], NP, personally preformed the services described in this documentation.  All medical record entries made by the scribe were at my direction and in my presence.  I have reviewed the chart and discharge instructions (if applicable) and agree that the record reflects my personal performance and is accurate and complete. 08/25/2021   I,Amber Collins,acting as a scribe for [name], NP.,have documented all relevant documentation on the behalf of [name], NP,as directed by  [name], NP while in the presence of [name], NP.   [name], NP

## 2021-08-28 ENCOUNTER — Telehealth: Payer: Self-pay | Admitting: Internal Medicine

## 2021-08-28 ENCOUNTER — Encounter: Payer: Self-pay | Admitting: Family

## 2021-08-28 NOTE — Telephone Encounter (Signed)
Patient returning call for lab results. 

## 2021-08-28 NOTE — Telephone Encounter (Signed)
Pt verbalized understanding of her results for her precath labs.  ?

## 2021-08-31 ENCOUNTER — Telehealth: Payer: Self-pay | Admitting: *Deleted

## 2021-08-31 NOTE — Telephone Encounter (Addendum)
Cardiac Catheterization scheduled at [facility] for: Friday September 01, 2021 9 AM ?Arrival time and place: [place] [facility]  at: 7 AM ? ? ?No solid food after midnight prior to cath, clear liquids until 5 AM day of procedure. ? ?Medication instructions: ?-Usual morning medications can be taken with sips of water including aspirin 81 mg. ? ?Confirmed patient has responsible adult to drive home post procedure and be with patient first 24 hours after arriving home: ? ?One visitor is allowed to stay in the waiting room during the time you are at the hospital for your procedure.  ? ?Reviewed procedure instructions with patient. ?

## 2021-09-01 ENCOUNTER — Encounter (HOSPITAL_COMMUNITY)
Admission: RE | Disposition: A | Discharge status: Home / Self Care | Payer: Self-pay | Source: Home / Self Care | Attending: Cardiovascular Disease

## 2021-09-01 ENCOUNTER — Other Ambulatory Visit: Payer: Self-pay

## 2021-09-01 ENCOUNTER — Ambulatory Visit (HOSPITAL_COMMUNITY)
Admission: RE | Admit: 2021-09-01 | Discharge: 2021-09-01 | Disposition: A | Discharge status: Home / Self Care | Payer: Medicare Other | Attending: Cardiovascular Disease | Admitting: Cardiovascular Disease

## 2021-09-01 DIAGNOSIS — I25118 Atherosclerotic heart disease of native coronary artery with other forms of angina pectoris: Secondary | ICD-10-CM

## 2021-09-01 DIAGNOSIS — E1143 Type 2 diabetes mellitus with diabetic autonomic (poly)neuropathy: Secondary | ICD-10-CM | POA: Diagnosis not present

## 2021-09-01 DIAGNOSIS — Z7982 Long term (current) use of aspirin: Secondary | ICD-10-CM | POA: Insufficient documentation

## 2021-09-01 DIAGNOSIS — K219 Gastro-esophageal reflux disease without esophagitis: Secondary | ICD-10-CM | POA: Diagnosis not present

## 2021-09-01 DIAGNOSIS — I10 Essential (primary) hypertension: Secondary | ICD-10-CM | POA: Diagnosis not present

## 2021-09-01 DIAGNOSIS — Z87891 Personal history of nicotine dependence: Secondary | ICD-10-CM | POA: Diagnosis not present

## 2021-09-01 DIAGNOSIS — I25110 Atherosclerotic heart disease of native coronary artery with unstable angina pectoris: Secondary | ICD-10-CM | POA: Insufficient documentation

## 2021-09-01 DIAGNOSIS — E079 Disorder of thyroid, unspecified: Secondary | ICD-10-CM | POA: Diagnosis not present

## 2021-09-01 DIAGNOSIS — R0600 Dyspnea, unspecified: Secondary | ICD-10-CM | POA: Insufficient documentation

## 2021-09-01 DIAGNOSIS — E785 Hyperlipidemia, unspecified: Secondary | ICD-10-CM | POA: Insufficient documentation

## 2021-09-01 DIAGNOSIS — Z79899 Other long term (current) drug therapy: Secondary | ICD-10-CM | POA: Insufficient documentation

## 2021-09-01 DIAGNOSIS — K3184 Gastroparesis: Secondary | ICD-10-CM | POA: Diagnosis not present

## 2021-09-01 HISTORY — PX: LEFT HEART CATH AND CORONARY ANGIOGRAPHY: CATH118249

## 2021-09-01 SURGERY — LEFT HEART CATH AND CORONARY ANGIOGRAPHY
Anesthesia: LOCAL

## 2021-09-01 MED ORDER — HYDRALAZINE HCL 20 MG/ML IJ SOLN: 10.0000 mg | INTRAMUSCULAR | Status: DC | PRN

## 2021-09-01 MED ORDER — IOHEXOL 350 MG/ML SOLN
INTRAVENOUS | Status: DC | PRN
  Administered 2021-09-01: 90 mL

## 2021-09-01 MED ORDER — SODIUM CHLORIDE 0.9 % IV SOLN: 250.0000 mL | INTRAVENOUS | Status: DC | PRN

## 2021-09-01 MED ORDER — HEPARIN (PORCINE) IN NACL 1000-0.9 UT/500ML-% IV SOLN
INTRAVENOUS | Status: AC
  Filled 2021-09-01: qty 1000

## 2021-09-01 MED ORDER — LABETALOL HCL 5 MG/ML IV SOLN: 10.0000 mg | INTRAVENOUS | Status: DC | PRN

## 2021-09-01 MED ORDER — SODIUM CHLORIDE 0.9 % IV SOLN: INTRAVENOUS | Status: AC

## 2021-09-01 MED ORDER — HEPARIN SODIUM (PORCINE) 1000 UNIT/ML IJ SOLN
INTRAMUSCULAR | Status: DC | PRN
  Administered 2021-09-01: 5000 [IU] via INTRAVENOUS

## 2021-09-01 MED ORDER — LIDOCAINE HCL (PF) 1 % IJ SOLN
INTRAMUSCULAR | Status: DC | PRN
  Administered 2021-09-01: 2 mL

## 2021-09-01 MED ORDER — MIDAZOLAM HCL 2 MG/2ML IJ SOLN
INTRAMUSCULAR | Status: DC | PRN
  Administered 2021-09-01: 2 mg via INTRAVENOUS

## 2021-09-01 MED ORDER — SODIUM CHLORIDE 0.9 % WEIGHT BASED INFUSION: 1.0000 mL/kg/h | INTRAVENOUS | Status: DC

## 2021-09-01 MED ORDER — ACETAMINOPHEN 325 MG PO TABS: 650.0000 mg | ORAL_TABLET | ORAL | Status: DC | PRN

## 2021-09-01 MED ORDER — SODIUM CHLORIDE 0.9 % WEIGHT BASED INFUSION
3.0000 mL/kg/h | INTRAVENOUS | Status: AC
  Administered 2021-09-01: 3 mL/kg/h via INTRAVENOUS

## 2021-09-01 MED ORDER — HEPARIN SODIUM (PORCINE) 1000 UNIT/ML IJ SOLN
INTRAMUSCULAR | Status: AC
  Filled 2021-09-01: qty 10

## 2021-09-01 MED ORDER — VERAPAMIL HCL 2.5 MG/ML IV SOLN
INTRAVENOUS | Status: DC | PRN
  Administered 2021-09-01: 10 mL via INTRA_ARTERIAL

## 2021-09-01 MED ORDER — ONDANSETRON HCL 4 MG/2ML IJ SOLN: 4.0000 mg | Freq: Four times a day (QID) | INTRAMUSCULAR | Status: DC | PRN

## 2021-09-01 MED ORDER — MIDAZOLAM HCL 2 MG/2ML IJ SOLN
INTRAMUSCULAR | Status: AC
Start: 2021-09-01 — End: ?
  Filled 2021-09-01: qty 2

## 2021-09-01 MED ORDER — SODIUM CHLORIDE 0.9% FLUSH: 3.0000 mL | INTRAVENOUS | Status: DC | PRN

## 2021-09-01 MED ORDER — SODIUM CHLORIDE 0.9% FLUSH: 3.0000 mL | Freq: Two times a day (BID) | INTRAVENOUS | Status: DC

## 2021-09-01 MED ORDER — LIDOCAINE HCL (PF) 1 % IJ SOLN
INTRAMUSCULAR | Status: AC
  Filled 2021-09-01: qty 30

## 2021-09-01 MED ORDER — VERAPAMIL HCL 2.5 MG/ML IV SOLN
INTRAVENOUS | Status: AC
  Filled 2021-09-01: qty 2

## 2021-09-01 MED ORDER — FENTANYL CITRATE (PF) 100 MCG/2ML IJ SOLN
INTRAMUSCULAR | Status: AC
  Filled 2021-09-01: qty 2

## 2021-09-01 MED ORDER — FENTANYL CITRATE (PF) 100 MCG/2ML IJ SOLN
INTRAMUSCULAR | Status: DC | PRN
Start: 2021-09-01 — End: 2021-09-01
  Administered 2021-09-01: 50 ug via INTRAVENOUS

## 2021-09-01 MED ORDER — HEPARIN (PORCINE) IN NACL 1000-0.9 UT/500ML-% IV SOLN
INTRAVENOUS | Status: DC | PRN
  Administered 2021-09-01 (×2): 500 mL

## 2021-09-01 MED ORDER — ASPIRIN 81 MG PO CHEW
81.0000 mg | CHEWABLE_TABLET | ORAL | Status: AC
  Administered 2021-09-01: 81 mg via ORAL
  Filled 2021-09-01: qty 1

## 2021-09-01 SURGICAL SUPPLY — 12 items
CATH INFINITI 5 FR JL3.5 (CATHETERS) ×2 IMPLANT
CATH INFINITI 5FR ANG PIGTAIL (CATHETERS) ×2 IMPLANT
CATH INFINITI JR4 5F (CATHETERS) ×2 IMPLANT
DEVICE RAD COMP TR BAND LRG (VASCULAR PRODUCTS) ×2 IMPLANT
GLIDESHEATH SLEND SS 6F .021 (SHEATH) ×2 IMPLANT
GUIDEWIRE INQWIRE 1.5J.035X260 (WIRE) IMPLANT
INQWIRE 1.5J .035X260CM (WIRE) ×3
KIT HEART LEFT (KITS) ×3 IMPLANT
PACK CARDIAC CATHETERIZATION (CUSTOM PROCEDURE TRAY) ×3 IMPLANT
SYR MEDRAD MARK 7 150ML (SYRINGE) ×3 IMPLANT
TRANSDUCER W/STOPCOCK (MISCELLANEOUS) ×3 IMPLANT
TUBING CIL FLEX 10 FLL-RA (TUBING) ×3 IMPLANT

## 2021-09-01 NOTE — Progress Notes (Signed)
Patient and daughter was given discharge instructions. Both verbalized understanding. 

## 2021-09-01 NOTE — Discharge Instructions (Signed)

## 2021-09-01 NOTE — Interval H&P Note (Signed)
History and Physical Interval Note: ? ?09/01/2021 ?7:35 AM ? ?GLENDON FISER  has presented today for surgery, with the diagnosis of unstable angina.  The various methods of treatment have been discussed with the patient and family. After consideration of risks, benefits and other options for treatment, the patient has consented to  Procedure(s): ?LEFT HEART CATH AND CORONARY ANGIOGRAPHY (N/A) as a surgical intervention.  The patient's history has been reviewed, patient examined, no change in status, stable for surgery.  I have reviewed the patient's chart and labs.  Questions were answered to the patient's satisfaction.   ? ?Cath Lab Visit (complete for each Cath Lab visit) ? ?Clinical Evaluation Leading to the Procedure:  ? ?ACS: No. ? ?Non-ACS:   ? ?Anginal Classification: CCS III ? ?Anti-ischemic medical therapy: Maximal Therapy (2 or more classes of medications) ? ?Non-Invasive Test Results: No non-invasive testing performed ? ?Prior CABG: No previous CABG ? ? ? ? ? ? ? ?Lauree Chandler ? ? ?

## 2021-09-04 ENCOUNTER — Telehealth: Payer: Self-pay | Admitting: Internal Medicine

## 2021-09-04 ENCOUNTER — Encounter (HOSPITAL_COMMUNITY): Payer: Self-pay | Admitting: Cardiovascular Disease

## 2021-09-04 MED ORDER — ISOSORBIDE MONONITRATE ER 60 MG PO TB24: 90.0000 mg | ORAL_TABLET | Freq: Every day | ORAL | 3 refills | Status: DC

## 2021-09-04 NOTE — Addendum Note (Signed)
Addended by: Stephani Police on: 09/04/2021 04:39 PM ? ? Modules accepted: Orders ? ?

## 2021-09-04 NOTE — Telephone Encounter (Signed)
REcent cath showed no signifant change in CAD  ?I would recomm increasing Imdur to 90 mg    F/U appt in 1 month to see how feels   ?

## 2021-09-04 NOTE — Telephone Encounter (Signed)
Pt advised and says she start the new dose 09/05/21... she says she has been doing well since her heart cath.  ?

## 2021-09-04 NOTE — Telephone Encounter (Signed)
-----   Message from Katrine Coho, RN sent at 09/04/2021  8:02 AM EDT ----- ?Regarding: RE: LHC ?Hi Dr Harrington Challenger, ? ?She does not have any follow up scheduled after cath 09/01/21. ?How soon does she need to schedule follow up appointment? ? ?Thanks, ?Anne ?----- Message ----- ?From: Stephani Police, RN ?Sent: 08/25/2021  10:46 AM EDT ?To: Katrine Coho, RN, # ?Subject: LHC                                           ? ?Pt to have a LHC per Dr. Harrington Challenger on 09/01/21 for unstable angina.  ? ?Labs and EKG 08/25/2021 ? ?Thank you! Ann  ? ? ?

## 2021-09-08 ENCOUNTER — Telehealth: Payer: Self-pay

## 2021-09-08 NOTE — Telephone Encounter (Signed)
Left a message for the [name] that we were able to  make her a Friday appt with Elwyn Reach NP on Friday 10/27/21 at 10:30 am.  ?

## 2021-09-08 NOTE — Telephone Encounter (Signed)
-----   Message from Fay Records, MD sent at 09/05/2021 11:22 PM EDT ----- ?Please make sure patient has follow up in a few wks     ?----- Message ----- ?From: Burnell Blanks, MD ?Sent: 09/01/2021   9:22 AM EDT ?To: Fay Records, MD, [name], NP ? ? ?

## 2021-09-11 ENCOUNTER — Telehealth: Payer: Self-pay

## 2021-09-11 ENCOUNTER — Encounter: Payer: Self-pay | Admitting: Family Medicine

## 2021-09-11 ENCOUNTER — Ambulatory Visit (INDEPENDENT_AMBULATORY_CARE_PROVIDER_SITE_OTHER): Payer: Medicare Other | Admitting: Family Medicine

## 2021-09-11 VITALS — BP 124/70 | HR 74 | Temp 98.0°F | Ht 65.0 in | Wt 222.6 lb

## 2021-09-11 DIAGNOSIS — L0201 Cutaneous abscess of face: Secondary | ICD-10-CM | POA: Diagnosis not present

## 2021-09-11 MED ORDER — DOXYCYCLINE HYCLATE 100 MG PO TABS: 100.0000 mg | ORAL_TABLET | Freq: Two times a day (BID) | ORAL | 0 refills | Status: AC

## 2021-09-11 NOTE — Progress Notes (Signed)
? ?Acute Office Visit ? ?Subjective:  ? ? Patient ID: [name], female    DOB: Sep 26, 1960, 61 y.o.   MRN: [id number] ? ?CC: face abscess  ? ? ?HPI ?Patient is in today for face abscess. ? ?Patient states that yesterday she noticed a small blackhead type lesion to right cheek. She started doing warm compresses. This morning, when she woke up the area was much larger, sore (3/10), red, and swelling extended up to her lower eyelid. She has not had any severe pain, fevers, other skin changes, drainage, etc.  ? ? ? ? ?Past Medical History:  ?Diagnosis Date  ? Allergy   ? allergic rhinitis  ? Arthritis   ? Atypical chest pain   ? Constipation   ? Cyst, ovarian   ? Diabetes mellitus, type II ([facility])   ? Elevated alkaline phosphatase level   ? Endometriosis   ? Esophageal stricture   ? Fatty liver   ? Gastroparesis   ? GERD (gastroesophageal reflux disease)   ? Heart murmur   ? Hepatic hemangioma   ? Hyperlipidemia   ? Hypertension   ? Hypertrophic condition of skin   ? 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 Iron deficiency anemia   ? Migraine headache   ? Non-compliance   ? Peptic ulcer disease   ? Thyroid disease   ? ? ?Past Surgical History:  ?Procedure Laterality Date  ? ABDOMINAL EXPLORATION SURGERY    ? w/bso   ? BREAST BIOPSY    ? CARDIAC CATHETERIZATION  2009  ? mild non obstructive CAD  ? CHOLECYSTECTOMY    ? COLONOSCOPY    ? ESOPHAGEAL MANOMETRY N/A 08/01/2020  ? Procedure: ESOPHAGEAL MANOMETRY (EM);  Surgeon: Yetta Flock, MD;  Location: [facility];  Service: Gastroenterology;  Laterality: N/A;  ? KNEE SURGERY  2005   ? left knee  ? LEFT HEART CATH AND CORONARY ANGIOGRAPHY N/A 07/17/2017  ? Procedure: LEFT HEART CATH AND CORONARY ANGIOGRAPHY;  Surgeon: Martinique, Peter M, MD;  Location: [facility];  Service: Cardiovascular;  Laterality: N/A;  ? LEFT HEART CATH AND CORONARY ANGIOGRAPHY N/A 09/01/2021  ? Procedure: LEFT HEART CATH AND CORONARY ANGIOGRAPHY;  Surgeon: Burnell Blanks, MD;  Location: [place] [facility];  Service: Cardiovascular;  Laterality: N/A;  ? POLYPECTOMY    ? TOTAL ABDOMINAL HYSTERECTOMY    ? UPPER GASTROINTESTINAL ENDOSCOPY    ? ? ?Family History  ?Problem Relation Age of Onset  ? Hypertension Mother   ? Rheum arthritis Mother   ? Hypertension Father   ? Diabetes Father   ? Prostate cancer Father   ? Kidney disease Father   ? Diabetes Sister   ? Fibromyalgia Sister   ? Diabetes Maternal Grandmother   ? Lung cancer Maternal Grandfather   ? Arthritis Brother   ? Migraines Brother   ? CAD Brother   ? Fibromyalgia Sister   ? Migraines Sister   ? Colon cancer Neg Hx   ? Thyroid disease Neg Hx   ? Colon polyps Neg Hx   ? ? ?Social History  ? ?Socioeconomic History  ? Marital status: Widowed  ?  Spouse name: Not on file  ? Number of children: 2  ? Years of education: Not on file  ? Highest education level: Not on file  ?Occupational History  ? Occupation: DISABILITY  ?  Employer: UNEMPLOYED  ?Tobacco Use  ? Smoking status: Former  ?  Packs/day: 0.50  ?  Years: 18.00  ?  Pack years: 9.00  ?  Types: Cigarettes  ?  Start date: 07/29/1977  ?  Quit date: 06/25/1994  ?  Years since quitting: 27.2  ? Smokeless tobacco: Never  ? Tobacco comments:  ?  quit 19 years ago  ?Vaping Use  ? Vaping Use: Never used  ?Substance and Sexual Activity  ? Alcohol use: Yes  ?  Alcohol/week: 0.0 standard drinks  ?  Comment: social drinker  ? Drug use: No  ? Sexual activity: Not Currently  ?Other Topics Concern  ? Not on file  ?Social History Narrative  ? Widowed  ? Has 2 grown children.  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   ? Former Smoker Quit tobacco in 1996.  She was a pack a day smoker for approximately 10 years.   ? Alcohol use-yes: Social   ? Daily Caffeine Use:6 pack of pepsi daily   ?  Illicit Drug Use - no   ? Patient does not get regular exercise.      ? Smoking Status:  quit  ? ?Social Determinants of Health  ? ?Financial Resource  Strain: Low Risk   ? Difficulty of Paying Living Expenses: Not hard at all  ?Food Insecurity: No Food Insecurity  ? Worried About Charity fundraiser in the Last Year: Never true  ? Ran Out of Food in the Last Year: Never true  ?Transportation Needs: No Transportation Needs  ? Lack of Transportation (Medical): No  ? Lack of Transportation (Non-Medical): No  ?Physical Activity: Insufficiently Active  ? Days of Exercise per Week: 2 days  ? Minutes of Exercise per Session: 30 min  ?Stress: No Stress Concern Present  ? Feeling of Stress : Not at all  ?Social Connections: Moderately Isolated  ? Frequency of Communication with Friends and Family: More than three times a week  ? Frequency of Social Gatherings with Friends and Family: More than three times a week  ? Attends Religious Services: More than 4 times per year  ? Active Member of Clubs or Organizations: No  ? Attends Archivist Meetings: Never  ? Marital Status: Widowed  ?Intimate Partner Violence: Not At Risk  ? Fear of Current or Ex-Partner: No  ? Emotionally Abused: No  ? Physically Abused: No  ? Sexually Abused: No  ? ? ?Outpatient Medications Prior to Visit  ?Medication Sig Dispense Refill  ? albuterol (VENTOLIN HFA) 108 (90 Base) MCG/ACT inhaler INHALE 2 PUFFS BY MOUTH 4 TIMES DAILY (Patient taking differently: Inhale 2 puffs into the lungs every 6 (six) hours as needed for wheezing or shortness of breath.) 9 g 0  ? alclomethasone (ACLOVATE) 0.05 % cream Apply topically 2 (two) times daily as needed (Rash). 180 g 3  ? amLODipine (NORVASC) 10 MG tablet Take 1 tablet by mouth once daily 90 tablet 0  ? aspirin 81 MG chewable tablet Chew 1 tablet (81 mg total) by mouth daily.    ? benzonatate (TESSALON) 100 MG capsule Take 1 capsule (100 mg total) by mouth 3 (three) times daily as needed. 20 capsule 0  ? clobetasol (TEMOVATE) 0.05 % external solution Apply 1 application topically 2 (two) times daily. (Patient taking differently: Apply 1 application.  topically 2 (two) times daily as needed (scalp irritation).) 50 mL 6  ? clotrimazole-betamethasone (LOTRISONE) cream Apply 1 application topically 2 (two) times daily. (Patient taking differently: Apply 1 application. topically 2 (two) times daily as  needed (irritation).) 30 g 0  ? Evolocumab (REPATHA SURECLICK) 017 MG/ML SOAJ Inject 140 mg into the skin every 14 (fourteen) days. 2 mL 11  ? fluconazole (DIFLUCAN) 150 MG tablet One tablet by mouth once weekly for 3 weeks. (Patient not taking: Reported on 08/28/2021) 3 tablet 0  ? fluticasone (FLONASE) 50 MCG/ACT nasal spray Place 2 sprays into both nostrils daily. (Patient taking differently: Place 2 sprays into both nostrils daily as needed for allergies.) 16 g 1  ? imipramine (TOFRANIL) 25 MG tablet Take 1 tablet (25 mg total) by mouth at bedtime. (Patient taking differently: Take 25 mg by mouth at bedtime as needed (migraines).) 30 tablet 3  ? isosorbide mononitrate (IMDUR) 60 MG 24 hr tablet Take 1.5 tablets (90 mg total) by mouth daily. 135 tablet 3  ? loratadine (CLARITIN) 10 MG tablet Take 1 tablet (10 mg total) by mouth daily. 10 tablet 0  ? methimazole (TAPAZOLE) 5 MG tablet Take 1 tablet by mouth once daily 90 tablet 0  ? metoprolol succinate (TOPROL-XL) 50 MG 24 hr tablet Take 1 tablet (50 mg total) by mouth daily. Take with or immediately following a meal. 90 tablet 3  ? nitroGLYCERIN (NITROSTAT) 0.4 MG SL tablet Place 1 tablet (0.4 mg total) under the tongue every 5 (five) minutes as needed for chest pain. 25 tablet 3  ? nystatin (MYCOSTATIN/NYSTOP) powder Apply 1 application topically 2 (two) times daily. Beneath both breasts (Patient taking differently: Apply 1 application. topically 2 (two) times daily as needed (irritation). Beneath both breasts) 60 g 1  ? ondansetron (ZOFRAN) 4 MG tablet Take 1 tab by mouth every 6 hours as needed for nausea. 100 tablet 1  ? potassium chloride SA (KLOR-CON M20) 20 MEQ tablet Take 1 tablet (20 mEq total) by mouth  daily. 30 tablet 0  ? rosuvastatin (CRESTOR) 20 MG tablet Take 1.5 tablets (30 mg total) by mouth daily. 135 tablet 1  ? SUMAtriptan (IMITREX) 50 MG tablet Take 1 tablet (50 mg total) by mouth every 2 (two) hours

## 2021-09-11 NOTE — Telephone Encounter (Signed)
Nurse Assessment ?Nurse: Radford Pax, RN, Eugene Garnet Date/Time Eilene Ghazi Time): 09/11/2021 7:45:58 AM ?Confirm and document reason for call. If ?symptomatic, describe symptoms. ?---Has cyst below eye on face that is hard, red, ?swollen. Size of half dollar. Eye almost swollen shut ?Does the patient have any new or worsening ?symptoms? ---Yes ?Will a triage be completed? ---Yes ?Related visit to physician within the last 2 weeks? ---No ?Does the PT have any chronic conditions? (i.e. ?diabetes, asthma, this includes High risk factors for ?pregnancy, etc.) ?---Yes ?List chronic conditions. ---HTN, high cholesterol ?Is this a behavioral health or substance abuse call? ---No ?Guidelines ?Guideline Title Affirmed Question Affirmed Notes Nurse Date/Time (Eastern ?Time) ?Skin Lump or ?Localized Swelling ?[1] Swelling is ?painful to touch AND ?[2] no fever ?Radford Pax, RN, Eugene Garnet 09/11/2021 7:48:00 ?AM ?Disp. Time (Eastern ?Time) Disposition Final User ?09/11/2021 7:50:31 AM See PCP within 24 Hours Yes Turner, RN, Eugene Garnet ?PLEASE NOTE: All timestamps contained within this report are represented as [place] Standard Time. ?CONFIDENTIALTY NOTICE: This fax transmission is intended only for the addressee. It contains information that is legally privileged, confidential or ?otherwise protected from use or disclosure. If you are not the intended recipient, you are strictly prohibited from reviewing, disclosing, copying using ?or disseminating any of this information or taking any action in reliance on or regarding this information. If you have received this fax in error, please ?notify us immediately by telephone so that we can arrange for its return to [place]. Phone: [phone], Toll-Free: [phone], Fax: [phone] ?Page: 2 of 2 ?Call Id: [id number] ?Caller Disagree/Comply Comply ?Caller Understands Yes ?PreDisposition Call Doctor ?Care Advice Given Per Guideline ?SEE PCP WITHIN 24 HOURS: * IF OFFICE WILL BE OPEN: You need to be examined  within the next 24 hours. Call your doctor ?(or NP/PA) when the office opens and make an appointment. CALL BACK IF: * Severe pain occurs * Fever occurs * You become ?worse CARE ADVICE given per Skin Swelling or Lump (Adult) guideline. ?Referrals ?REFERRED TO PCP OFFICE ?

## 2021-09-11 NOTE — Telephone Encounter (Addendum)
Left the pt a message to offer her the 10/27/21 appt but would like to bring her in sooner of possible if she can come any other day other than a Friday as she has previously stated.  ?

## 2021-09-11 NOTE — Patient Instructions (Addendum)
Continue warm compresses.  ?Start antibiotics - education provided.  ?Follow-up here in a few days for [place] to recheck and make sure we are getting improvement.  ?If symptoms worsen, you start running high fevers, vision changes, etc., go to the Emergency Department.  ? ?

## 2021-09-11 NOTE — Telephone Encounter (Signed)
Appt scheduled w/ Lovena Le today.  ?

## 2021-09-12 NOTE — Telephone Encounter (Signed)
Left another message for the pt and since unable to reach her re: her May appt I have mailed her a letter.  ?

## 2021-09-15 ENCOUNTER — Ambulatory Visit (INDEPENDENT_AMBULATORY_CARE_PROVIDER_SITE_OTHER): Payer: Medicare Other | Admitting: Family

## 2021-09-15 VITALS — BP 125/79 | HR 67 | Temp 98.6°F | Resp 16 | Wt 225.0 lb

## 2021-09-15 DIAGNOSIS — L0201 Cutaneous abscess of face: Secondary | ICD-10-CM | POA: Diagnosis not present

## 2021-09-15 DIAGNOSIS — M545 Low back pain, unspecified: Secondary | ICD-10-CM

## 2021-09-15 MED ORDER — METHOCARBAMOL 500 MG PO TABS: 500.0000 mg | ORAL_TABLET | Freq: Three times a day (TID) | ORAL | 0 refills | Status: DC | PRN

## 2021-09-15 NOTE — Progress Notes (Signed)
? ?Subjective:  ? ?By signing my name below, I, [name], attest that this documentation has been prepared under the direction and in the presence of [name], 09/15/2021   ? ? Patient ID: [name], female    DOB: [birth year], 61 y.o.   MRN: [id number] ? ?Chief Complaint  ?Patient presents with  ? Abscess  ?  Here for follow up uf facial abscess, on doxycycline  ? ? ?HPI ?Patient is in today for an office visit. ? ?Skin Abscess on Right Cheek - Patient's skin abscess has been improving. ? ?Lower Back Pain - She complains of lower back pain that has arisen over a month ago. She was taking Celebrex but reports that it was not relieving her symptoms. She is requesting a muscle relaxer.  ? ?Health Maintenance Due  ?Topic Date Due  ? URINE MICROALBUMIN  02/07/2021  ? TETANUS/TDAP  04/29/2021  ? ? ?Past Medical History:  ?Diagnosis Date  ? Allergy   ? allergic rhinitis  ? Arthritis   ? Atypical chest pain   ? Constipation   ? Cyst, ovarian   ? Diabetes mellitus, type II ([place])   ? Elevated alkaline phosphatase level   ? Endometriosis   ? Esophageal stricture   ? Fatty liver   ? Gastroparesis   ? GERD (gastroesophageal reflux disease)   ? Heart murmur   ? Hepatic hemangioma   ? Hyperlipidemia   ? Hypertension   ? Hypertrophic condition of skin   ? 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 Iron deficiency anemia   ? Migraine headache   ? Non-compliance   ? Peptic ulcer disease   ? Thyroid disease   ? ? ?Past Surgical History:  ?Procedure Laterality Date  ? ABDOMINAL EXPLORATION SURGERY    ? w/bso   ? BREAST BIOPSY    ? CARDIAC CATHETERIZATION  2009  ? mild non obstructive CAD  ? CHOLECYSTECTOMY    ? COLONOSCOPY    ? ESOPHAGEAL MANOMETRY N/A 08/01/2020  ? Procedure: ESOPHAGEAL MANOMETRY (EM);  Surgeon: Yetta Flock, MD;  Location: [facility];  Service: Gastroenterology;  Laterality: N/A;  ? KNEE SURGERY  2005   ? left knee  ? LEFT HEART CATH AND CORONARY ANGIOGRAPHY N/A  07/17/2017  ? Procedure: LEFT HEART CATH AND CORONARY ANGIOGRAPHY;  Surgeon: Martinique, Peter M, MD;  Location: [facility];  Service: Cardiovascular;  Laterality: N/A;  ? LEFT HEART CATH AND CORONARY ANGIOGRAPHY N/A 09/01/2021  ? Procedure: LEFT HEART CATH AND CORONARY ANGIOGRAPHY;  Surgeon: Burnell Blanks, MD;  Location: [facility];  Service: Cardiovascular;  Laterality: N/A;  ? POLYPECTOMY    ? TOTAL ABDOMINAL HYSTERECTOMY    ? UPPER GASTROINTESTINAL ENDOSCOPY    ? ? ?Family History  ?Problem Relation Age of Onset  ? Hypertension Mother   ? Rheum arthritis Mother   ? Hypertension Father   ? Diabetes Father   ? Prostate cancer Father   ? Kidney disease Father   ? Diabetes Sister   ? Fibromyalgia Sister   ? Diabetes Maternal Grandmother   ? Lung cancer Maternal Grandfather   ? Arthritis Brother   ? Migraines Brother   ? CAD Brother   ? Fibromyalgia Sister   ? Migraines Sister   ? Colon cancer Neg Hx   ? Thyroid disease Neg Hx   ? Colon polyps Neg Hx   ? ? ?Social History  ? ?Socioeconomic History  ? Marital status: Widowed  ?  Spouse name: Not on file  ? Number of children: 2  ? Years of education: Not on file  ? Highest education level: Not on file  ?Occupational History  ? Occupation: DISABILITY  ?  Employer: UNEMPLOYED  ?Tobacco Use  ? Smoking status: Former  ?  Packs/day: 0.50  ?  Years: 18.00  ?  Pack years: 9.00  ?  Types: Cigarettes  ?  Start date: 07/29/1977  ?  Quit date: 06/25/1994  ?  Years since quitting: 27.2  ? Smokeless tobacco: Never  ? Tobacco comments:  ?  quit 19 years ago  ?Vaping Use  ? Vaping Use: Never used  ?Substance and Sexual Activity  ? Alcohol use: Yes  ?  Alcohol/week: 0.0 standard drinks  ?  Comment: social drinker  ? Drug use: No  ? Sexual activity: Not Currently  ?Other Topics Concern  ? Not on file  ?Social History Narrative  ? Widowed  ? Has 2 grown children.  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   ? Former Smoker Quit  tobacco in 1996.  She was a pack a day smoker for approximately 10 years.   ? Alcohol use-yes: Social   ? Daily Caffeine Use:6 pack of pepsi daily   ?  Illicit Drug Use - no   ? Patient does not get regular exercise.      ? Smoking Status:  quit  ? ?Social Determinants of Health  ? ?Financial Resource Strain: Low Risk   ? Difficulty of Paying Living Expenses: Not hard at all  ?Food Insecurity: No Food Insecurity  ? Worried About Charity fundraiser in the Last Year: Never true  ? Ran Out of Food in the Last Year: Never true  ?Transportation Needs: No Transportation Needs  ? Lack of Transportation (Medical): No  ? Lack of Transportation (Non-Medical): No  ?Physical Activity: Insufficiently Active  ? Days of Exercise per Week: 2 days  ? Minutes of Exercise per Session: 30 min  ?Stress: No Stress Concern Present  ? Feeling of Stress : Not at all  ?Social Connections: Moderately Isolated  ? Frequency of Communication with Friends and Family: More than three times a week  ? Frequency of Social Gatherings with Friends and Family: More than three times a week  ? Attends Religious Services: More than 4 times per year  ? Active Member of Clubs or Organizations: No  ? Attends Archivist Meetings: Never  ? Marital Status: Widowed  ?Intimate Partner Violence: Not At Risk  ? Fear of Current or Ex-Partner: No  ? Emotionally Abused: No  ? Physically Abused: No  ? Sexually Abused: No  ? ? ?Outpatient Medications Prior to Visit  ?Medication Sig Dispense Refill  ? albuterol (VENTOLIN HFA) 108 (90 Base) MCG/ACT inhaler INHALE 2 PUFFS BY MOUTH 4 TIMES DAILY (Patient taking differently: Inhale 2 puffs into the lungs every 6 (six) hours as needed for wheezing or shortness of breath.) 9 g 0  ? alclomethasone (ACLOVATE) 0.05 % cream Apply topically 2 (two) times daily as needed (Rash). 180 g 3  ? amLODipine (NORVASC) 10 MG tablet Take 1 tablet by mouth once daily 90 tablet 0  ? aspirin 81 MG chewable tablet Chew 1 tablet (81 mg  total) by mouth daily.    ? benzonatate (TESSALON) 100 MG capsule Take 1 capsule (100 mg total) by mouth 3 (three) times daily as needed. 20 capsule 0  ? clobetasol (TEMOVATE)  0.05 % external solution Apply 1 application topically 2 (two) times daily. (Patient taking differently: Apply 1 application. topically 2 (two) times daily as needed (scalp irritation).) 50 mL 6  ? clotrimazole-betamethasone (LOTRISONE) cream Apply 1 application topically 2 (two) times daily. (Patient taking differently: Apply 1 application. topically 2 (two) times daily as needed (irritation).) 30 g 0  ? doxycycline (VIBRA-TABS) 100 MG tablet Take 1 tablet (100 mg total) by mouth 2 (two) times daily for 10 days. 20 tablet 0  ? Evolocumab (REPATHA SURECLICK) 751 MG/ML SOAJ Inject 140 mg into the skin every 14 (fourteen) days. 2 mL 11  ? fluconazole (DIFLUCAN) 150 MG tablet One tablet by mouth once weekly for 3 weeks. 3 tablet 0  ? fluticasone (FLONASE) 50 MCG/ACT nasal spray Place 2 sprays into both nostrils daily. (Patient taking differently: Place 2 sprays into both nostrils daily as needed for allergies.) 16 g 1  ? imipramine (TOFRANIL) 25 MG tablet Take 1 tablet (25 mg total) by mouth at bedtime. (Patient taking differently: Take 25 mg by mouth at bedtime as needed (migraines).) 30 tablet 3  ? isosorbide mononitrate (IMDUR) 60 MG 24 hr tablet Take 1.5 tablets (90 mg total) by mouth daily. 135 tablet 3  ? loratadine (CLARITIN) 10 MG tablet Take 1 tablet (10 mg total) by mouth daily. 10 tablet 0  ? methimazole (TAPAZOLE) 5 MG tablet Take 1 tablet by mouth once daily 90 tablet 0  ? metoprolol succinate (TOPROL-XL) 50 MG 24 hr tablet Take 1 tablet (50 mg total) by mouth daily. Take with or immediately following a meal. 90 tablet 3  ? nitroGLYCERIN (NITROSTAT) 0.4 MG SL tablet Place 1 tablet (0.4 mg total) under the tongue every 5 (five) minutes as needed for chest pain. 25 tablet 3  ? nystatin (MYCOSTATIN/NYSTOP) powder Apply 1 application  topically 2 (two) times daily. Beneath both breasts (Patient taking differently: Apply 1 application. topically 2 (two) times daily as needed (irritation). Beneath both breasts) 60 g 1  ? ondansetron (ZOFRAN) 4

## 2021-09-15 NOTE — Assessment & Plan Note (Signed)
Trial of prn robaxin. Pt understands not to drive after taking.  ?

## 2021-09-15 NOTE — Assessment & Plan Note (Signed)
Improving. Pt has 3 more days of antibiotics. Advise pt to complete antibiotics and call if area does not continue to improve. ?

## 2021-09-15 NOTE — Assessment & Plan Note (Signed)
>>  ASSESSMENT AND PLAN FOR LOW BACK PAIN WRITTEN ON 09/15/2021 10:24 AM BY O'SULLIVAN, Nolen Lindamood, NP  Trial of prn robaxin . Pt understands not to drive after taking.

## 2021-09-22 ENCOUNTER — Ambulatory Visit: Payer: Medicare PPO | Admitting: Gastroenterology

## 2021-09-25 ENCOUNTER — Telehealth: Payer: Self-pay

## 2021-09-25 NOTE — Telephone Encounter (Signed)
Prior Authorization- Repatha SureClick 919 mg/mL ? ?Request Reference Number: [id number]. REPATHA SURE INJ '140MG'$ /ML is approved through 03/27/2022.  ?

## 2021-10-02 ENCOUNTER — Other Ambulatory Visit: Payer: Self-pay

## 2021-10-02 MED ORDER — POTASSIUM CHLORIDE CRYS ER 20 MEQ PO TBCR: 20.0000 meq | EXTENDED_RELEASE_TABLET | Freq: Every day | ORAL | 11 refills | Status: DC

## 2021-10-09 ENCOUNTER — Telehealth: Payer: Self-pay | Admitting: Family

## 2021-10-09 MED ORDER — METHYLPREDNISOLONE 4 MG PO TBPK: ORAL_TABLET | ORAL | 0 refills | Status: DC

## 2021-10-09 NOTE — Telephone Encounter (Signed)
Pt states she has been taking 4 extra strength tylenol along with her muscle relaxers for her back pain with no relief. Pt states she would like an '800mg'$  ibuprofen prescription if possible to help combat the pain. Please advise. ?

## 2021-10-09 NOTE — Telephone Encounter (Signed)
I would prefer to try to avoid such high doses of ibuprofen as it can cause stomach bleeding and kidney problems.  I would recommend trial of Medrol dose pak (has been sent to her pharmacy). Let me know if her symptoms to not improve.  ?

## 2021-10-09 NOTE — Telephone Encounter (Signed)
Patient advised of provider's comments and new rx. She verbalized understanding. ?

## 2021-10-13 ENCOUNTER — Ambulatory Visit (INDEPENDENT_AMBULATORY_CARE_PROVIDER_SITE_OTHER): Payer: Medicare Other | Admitting: Gastroenterology

## 2021-10-13 ENCOUNTER — Encounter: Payer: Self-pay | Admitting: Family

## 2021-10-13 ENCOUNTER — Encounter: Payer: Self-pay | Admitting: Gastroenterology

## 2021-10-13 VITALS — BP 130/64 | HR 66 | Ht 64.0 in | Wt 220.0 lb

## 2021-10-13 DIAGNOSIS — D1803 Hemangioma of intra-abdominal structures: Secondary | ICD-10-CM | POA: Diagnosis not present

## 2021-10-13 DIAGNOSIS — K3184 Gastroparesis: Secondary | ICD-10-CM

## 2021-10-13 DIAGNOSIS — K760 Fatty (change of) liver, not elsewhere classified: Secondary | ICD-10-CM

## 2021-10-13 DIAGNOSIS — K224 Dyskinesia of esophagus: Secondary | ICD-10-CM | POA: Diagnosis not present

## 2021-10-13 DIAGNOSIS — R1310 Dysphagia, unspecified: Secondary | ICD-10-CM

## 2021-10-13 MED ORDER — METOCLOPRAMIDE HCL 5 MG PO TABS: 5.0000 mg | ORAL_TABLET | Freq: Three times a day (TID) | ORAL | 0 refills | Status: DC

## 2021-10-13 MED ORDER — HYOSCYAMINE SULFATE 0.125 MG PO TABS: 0.1250 mg | ORAL_TABLET | ORAL | 1 refills | Status: DC | PRN

## 2021-10-13 NOTE — Progress Notes (Signed)
? ?HPI :  ?61 year old female here for a follow-up visit dysphagia / jackhammer esophagus, early satiety, elevated liver enzymes,  fatty liver / hepatic hemangioma. ?  ?She was last seen her about a year ago.  ? ?See prior notes for details of her case.  She has had intermittent dysphagia to both solids and liquids that are longstanding.  She has had an extensive evaluation for this in the past.  We initially did an EGD in January 2019, she had a small hiatal hernia and a very mild benign stricture at the [place] [address].  She underwent balloon dilation to 20 mm but no mucosal rent was even noted.  She had no relief of dysphagia with that dilation.  Post procedure she had severe chest pain in fact was admitted to the hospital, had no damage to her esophagus but had a troponin elevation and cardiology thought she had a vasospastic cardiac event that led to her symptoms. ?Since that time she has been followed by Dr. Harrington Challenger in cardiology, she is on nitrates for this issue. ?In fact due to her chest pain she had a cardiac cath most recently in March 2023, last month, she has mild nonobstructive disease in the LAD, circumflex, RCA.  Unchanged from cardiac cath in 2019, thought to have mild to moderate CAD and medical management was recommended. ? ? ?She has otherwise had a barium swallow in regards to these persistent symptoms and it showed mild circumferential thickening of the distal esophagus with delayed emptying however barium tablet passed through without delay.  She had an esophageal manometry in February 2022 which showed evidence of jackhammer esophagus.  Trial of peppermint altoids did not help. She is already taking Norvasc for hypertension and Toprol as well as Imdur daily and oral nitroglycerin as needed for chest pain.  She is not having any reflux symptoms or heartburn that bothers her. ? ?At her last visit we discussed starting imipramine 25 mg nightly.  I have not seen her since that time.  The medication is on her  list however she states she never picked it up and never took it.  Her symptoms are essentially unchanged.  She continues to have dysphagia with both solids and liquids at times when eating.  She also has some chest discomfort associated with eating.  She does not have much of any heartburn that bothers her.  She does have chronic early satiety for years.  Recall she has had a history of gastroparesis diagnosed in the past in the setting of diabetes.  She has been tried on numerous regimens in the past to include Reglan, erythromycin, and domperidone, none of which were reported to help although she has not been on those since about 2010 or so.  She continues to have early satiety that bothers her significantly.  She states after a few bites of food she feels full and can rarely vomit if she feels too full. ?  ?Recall she has had an elevated alkaline phosphatase over the past few years.  GGT is elevated, alkaline phosphatase level has mostly been high 100s to lower 100s.  ALT AST have been normal ?Although last checked in December she had an ALT of 50s.  Work-up with labs for autoimmune and PBC has been negative.  She has had a large hepatic hemangioma dating back to 2010 or so.  It is grown about 2 cm over the past 11 years or so on imaging from 6 to 8 cm or so.  She does  have occasional upper abdominal pain.  She does not drink any alcohol.  She inquires about follow-up imaging. ? ?   ?Prior work-up ?CT 08/12/19 -  ?IMPRESSION: ?Stable appearing hepatic hemangioma. 8.4 cm in size, mild fatty infiltration ?Fatty liver. ?  ?  ?Flex sig 04/10/2018 - Preparation of the colon was poor, inadquate for assessing for polyps. ?- Of the visualized mucosa in the left colon, it appeared normal without any inflammatory ?changes. Biopsied. ?- Sharp / angulated turn in the sigmoid colon ?Overall, ischemic colitis remains possible to account for prior symptoms although nothing ?overtly appreciated on today's exam. ?  ?EGD  1/22/20219 - Esophagogastric landmarks identified. ?- 2 cm hiatal hernia. ?- One suspected benign-appearing esophageal stenosis. Dilated to 72m without mucosal ?wrents. ?- Normal stomach. ?- A single suspected duodenal polyp versus less likely ampulla. Biopsied. ?  ?Colonoscopy 07/16/2017 - The perianal and digital rectal examinations were normal. ?- The terminal ileum appeared normal. ?- The colon was tortuous. ?- A few medium-mouthed diverticula were found in the sigmoid colon. ?- Internal hemorrhoids were found during retroflexion. ?- The exam was otherwise without abnormality. ?  ?GES abnormal 03/07/09 - delayed ?GES normal 02/03/10 ?  ?Barium swallow 09/04/19 - IMPRESSION: ?Mild circumferential narrowing of the distal esophagus resulting in ?delayed esophageal clearance. A 13 mm barium tablet passes without ?difficulty. Patient reported presence of typical symptoms during the ?Study. ?  ?Labs Feb - Mach 2021 ?- hep panel negative ?- (+) ANA 1:320, IgG 1243, SMA negative ?- AMA negative ?  ?Repeated AP to 138 on 10/20/19, and then normalized on 01/13/20 to 109. AP again elevated to 162 ?  ?Mild microcytic anemia but normal iron studies, in fact ferritin noted of 1462 - patient has been on / off iron supplements over the years, followed by hematology ?  ?Esophageal manometry 08/01/20 - Jackhammer esophagus ? ? ?Past Medical History:  ?Diagnosis Date  ? Allergy   ? allergic rhinitis  ? Arthritis   ? Atypical chest pain   ? Constipation   ? Cyst, ovarian   ? [place]   ? Elevated alkaline phosphatase level   ? Endometriosis   ? Esophageal stricture   ? Fatty liver   ? Gastroparesis   ? GERD (gastroesophageal reflux disease)   ? Heart murmur   ? Hepatic hemangioma   ? Hyperlipidemia   ? Hypertension   ? Hypertrophic condition of skin   ? 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 Iron deficiency anemia   ? Migraine headache   ? Non-compliance   ? Peptic ulcer disease   ? Thyroid  disease   ? ? ? ?Past Surgical History:  ?Procedure Laterality Date  ? ABDOMINAL EXPLORATION SURGERY    ? w/bso   ? BREAST BIOPSY    ? CARDIAC CATHETERIZATION  2009  ? mild non obstructive CAD  ? CHOLECYSTECTOMY    ? COLONOSCOPY    ? ESOPHAGEAL MANOMETRY N/A 08/01/2020  ? Procedure: ESOPHAGEAL MANOMETRY (EM);  Surgeon: AYetta Flock MD;  [facility];  Service: Gastroenterology;  Laterality: N/A;  ? KNEE SURGERY  2005   ? left knee  ? LEFT HEART CATH AND CORONARY ANGIOGRAPHY N/A 07/17/2017  ? Procedure: LEFT HEART CATH AND CORONARY ANGIOGRAPHY;  Surgeon: JMartinique Peter M, MD;  [facility];  Service: Cardiovascular;  Laterality: N/A;  ? LEFT HEART CATH AND CORONARY ANGIOGRAPHY N/A 09/01/2021  ? Procedure: LEFT HEART CATH AND CORONARY ANGIOGRAPHY;  Surgeon: Burnell Blanks, MD;  Location: [facility];  Service: Cardiovascular;  Laterality: N/A;  ? POLYPECTOMY    ? TOTAL ABDOMINAL HYSTERECTOMY    ? UPPER GASTROINTESTINAL ENDOSCOPY    ? ?Family History  ?Problem Relation Age of Onset  ? Hypertension Mother   ? Rheum arthritis Mother   ? Hypertension Father   ? Diabetes Father   ? Prostate cancer Father   ? Kidney disease Father   ? Diabetes Sister   ? Fibromyalgia Sister   ? Diabetes Maternal Grandmother   ? Lung cancer Maternal Grandfather   ? Arthritis Brother   ? Migraines Brother   ? CAD Brother   ? Fibromyalgia Sister   ? Migraines Sister   ? Colon cancer Neg Hx   ? Thyroid disease Neg Hx   ? Colon polyps Neg Hx   ? ?Social History  ? ?Tobacco Use  ? Smoking status: Former  ?  Packs/day: 0.50  ?  Years: 18.00  ?  Pack years: 9.00  ?  Types: Cigarettes  ?  Start date: 07/29/1977  ?  Quit date: 06/25/1994  ?  Years since quitting: 27.3  ? Smokeless tobacco: Never  ? Tobacco comments:  ?  quit 19 years ago  ?Vaping Use  ? Vaping Use: Never used  ?Substance Use Topics  ? Alcohol use: Yes  ?  Alcohol/week: 0.0 standard drinks  ?  Comment: social drinker  ? Drug use: No   ? ?Current Outpatient Medications  ?Medication Sig Dispense Refill  ? albuterol (VENTOLIN HFA) 108 (90 Base) MCG/ACT inhaler INHALE 2 PUFFS BY MOUTH 4 TIMES DAILY (Patient taking differently: Inhale 2 puffs into the lungs

## 2021-10-13 NOTE — Patient Instructions (Addendum)
You will be contacted by [facility] to schedule an ultrasound. Please arrive 15 minutes prior to your appointment for registration. Make certain not to have anything to eat or drink 6 hours prior to your appointment. Should you need to reschedule your appointment, please contact radiology at [phone]. This test typically takes about 30 minutes to perform.  ? ?We have sent the following medications to your pharmacy for you to pick up at your convenience: Levsin  ?Reglan ? ?You will need to come to the lab in June to have your liver function tests checked.  You will not need an appointment.  Please report right to the lab in the basement.  ? ?--------------------------------------------------------------------------------------------------------------------------------------------; ? ? ?If you are age 45 or older, your body mass index should be between 23-30. Your Body mass index is 37.76 kg/m?Marland Kitchen If this is out of the aforementioned range listed, please consider follow up with your Primary Care Provider. ? ?If you are age 66 or younger, your body mass index should be between 19-25. Your Body mass index is 37.76 kg/m?Marland Kitchen If this is out of the aformentioned range listed, please consider follow up with your Primary Care Provider.  ? ?________________________________________________________ ? ?The [facility] providers would like to encourage you to use [facility] to communicate with providers for non-urgent requests or questions.  Due to long hold times on the telephone, sending your provider a message by [facility] may be a faster and more efficient way to get a response.  Please allow 48 business hours for a response.  Please remember that this is for non-urgent requests.  ?_______________________________________________________ ? ? ? ? ? ?

## 2021-10-16 ENCOUNTER — Telehealth: Payer: Self-pay

## 2021-10-16 DIAGNOSIS — D1803 Hemangioma of intra-abdominal structures: Secondary | ICD-10-CM

## 2021-10-16 NOTE — Telephone Encounter (Signed)
-----   Message from Roetta Sessions, [place] sent at 10/13/2021  2:00 PM EDT ----- ?Regarding: imipramine ?Did she find it and has she been taking it?? ?----- Message ----- ?From: Marlon Pel, RN ?Sent: 10/13/2021  10:36 AM EDT ?To: Roetta Sessions, CMA ? ?Please call her on Monday and inquire is she found her imipramine ? ? ?

## 2021-10-16 NOTE — Telephone Encounter (Signed)
Called and left message for patient to let us know if she is taking the imipramine or not. ?

## 2021-10-17 ENCOUNTER — Encounter: Payer: Self-pay | Admitting: Family

## 2021-10-18 NOTE — Telephone Encounter (Signed)
Called and left detailed message for patient that if she has been taking the imipramine 25 mg, 1 tablet qhs, to please increase to 2 tablets qhs and try that for a couple of weeks to see if that helps her symptoms. I asked that she keep [place] posted and if she doesn't get benefit from two weeks of 2 tabs at night she can go back to 1 tab and we may discontinue at some point.  Patient was encouraged to call back if she had any questions or needed further clarification ?

## 2021-10-18 NOTE — Telephone Encounter (Signed)
Inbound call from patient reports she is taking Imipramine  ?

## 2021-10-25 NOTE — Progress Notes (Signed)
?Cardiology Office Note:   ? ?Date:  10/27/2021  ? ?ID:  [name], DOB 1960-12-28, MRN [id number] ? ?PCP:  [name], NP ?  ?[facility] Prov[facility] ?Cardiologist:  [name], MD    ? ?Referring MD: [name], NP  ? ?Chief Complaint: follow-up CAD with angina ? ?History of Present Illness:   ? ?[name] is a very pleasant 61 y.o. female with a hx of aortic atherosclerosis, mild to moderate CAD by cath, hypertension, esophageal strictures.  ? ?In January 2019 she had chest pain and vomiting that occurred after outpatient EGD/dilatation.  Troponin was elevated.  Underwent cardiac catheterization 07/17/2017 that revealed 75% mid PDA (small vessel), proximal RCA with 25%, OM 50%, normal LVEF, focal wall motion abnormality questionable effect of spasm. ? ?She was last seen in our office on 08/25/2021 by Dr. Harrington Challenger at which time she reported more heart racing, fatigue, and chest tightness.  Noted increased shortness of breath with activity.  Had taken 3 SL nitroglycerin in the previous week.  Cardiac catheterization 09/02/2011 showed no significant change in CAD.  Dr. Harrington Challenger recommended increasing her Imdur to 90 mg daily and return for follow-up in 1 month. ? ?Today, she is here alone for her follow-up.  She reports she continues to have midsternal chest pain.  Was recently seen by gastroenterologist and he feels that pain is stemming from jackhammer esophagus.  She was prescribed medical therapy which she states she is taking, however she does not feel much improvement.  Also cannot tell significant improvement since increasing Imdur to 90 mg daily. Has some mild palpitations associated with dyspnea. 3 pillow orthopnea is stable, no PND, no edema. Takes care of toddler grandson so is active all day with him. Walks for exercise. Tries to avoid high sodium foods, however she uses broth for seasoning vegetables. She denies lower extremity edema, fatigue, melena, hematuria, hemoptysis,  diaphoresis, weakness, presyncope, and syncope.  ? ?Past Medical History:  ?Diagnosis Date  ? Allergy   ? allergic rhinitis  ? Arthritis   ? Atypical chest pain   ? Constipation   ? Cyst, ovarian   ? Diabetes mellitus, type II ([place])   ? Elevated alkaline phosphatase level   ? Endometriosis   ? Esophageal stricture   ? Fatty liver   ? Gastroparesis   ? GERD (gastroesophageal reflux disease)   ? Heart murmur   ? Hepatic hemangioma   ? Hyperlipidemia   ? Hypertension   ? Hypertrophic condition of skin   ? 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 Iron deficiency anemia   ? Migraine headache   ? Non-compliance   ? Peptic ulcer disease   ? Thyroid disease   ? ? ?Past Surgical History:  ?Procedure Laterality Date  ? ABDOMINAL EXPLORATION SURGERY    ? w/bso   ? BREAST BIOPSY    ? CARDIAC CATHETERIZATION  2009  ? mild non obstructive CAD  ? CHOLECYSTECTOMY    ? COLONOSCOPY    ? ESOPHAGEAL MANOMETRY N/A 08/01/2020  ? Procedure: ESOPHAGEAL MANOMETRY (EM);  Surgeon: Yetta Flock, MD;  Location: [facility];  Service: Gastroenterology;  Laterality: N/A;  ? KNEE SURGERY  2005   ? left knee  ? LEFT HEART CATH AND CORONARY ANGIOGRAPHY N/A 07/17/2017  ? Procedure: LEFT HEART CATH AND CORONARY ANGIOGRAPHY;  Surgeon: Martinique, Peter M, MD;  Location: [facility];  Service: Cardiovascular;  Laterality: N/A;  ? LEFT HEART CATH AND CORONARY ANGIOGRAPHY N/A 09/01/2021  ?  Procedure: LEFT HEART CATH AND CORONARY ANGIOGRAPHY;  Surgeon: Burnell Blanks, MD;  Location: [facility];  Service: Cardiovascular;  Laterality: N/A;  ? POLYPECTOMY    ? TOTAL ABDOMINAL HYSTERECTOMY    ? UPPER GASTROINTESTINAL ENDOSCOPY    ? ? ?Current Medications: ?Current Meds  ?Medication Sig  ? albuterol (VENTOLIN HFA) 108 (90 Base) MCG/ACT inhaler INHALE 2 PUFFS BY MOUTH 4 TIMES DAILY (Patient taking differently: Inhale 2 puffs into the lungs every 6 (six) hours as needed for wheezing or shortness of breath.)  ?  alclomethasone (ACLOVATE) 0.05 % cream Apply topically 2 (two) times daily as needed (Rash).  ? amLODipine (NORVASC) 10 MG tablet Take 1 tablet by mouth once daily  ? aspirin 81 MG chewable tablet Chew 1 tablet (81 mg total) by mouth daily.  ? benzonatate (TESSALON) 100 MG capsule Take 1 capsule (100 mg total) by mouth 3 (three) times daily as needed.  ? clobetasol (TEMOVATE) 0.05 % external solution Apply 1 application topically 2 (two) times daily. (Patient taking differently: Apply 1 application. topically 2 (two) times daily as needed (scalp irritation).)  ? clotrimazole-betamethasone (LOTRISONE) cream Apply 1 application topically 2 (two) times daily. (Patient taking differently: Apply 1 application. topically 2 (two) times daily as needed (irritation).)  ? Evolocumab (REPATHA SURECLICK) 993 MG/ML SOAJ Inject 140 mg into the skin every 14 (fourteen) days.  ? fluconazole (DIFLUCAN) 150 MG tablet One tablet by mouth once weekly for 3 weeks.  ? fluticasone (FLONASE) 50 MCG/ACT nasal spray Place 2 sprays into both nostrils daily. (Patient taking differently: Place 2 sprays into both nostrils daily as needed for allergies.)  ? hyoscyamine (LEVSIN) 0.125 MG tablet Take 1 tablet (0.125 mg total) by mouth every 4 (four) hours as needed for cramping (diarrhea,nausea).  ? imipramine (TOFRANIL) 25 MG tablet Take 1 tablet (25 mg total) by mouth at bedtime. (Patient taking differently: Take 25 mg by mouth at bedtime as needed (migraines).)  ? isosorbide mononitrate (IMDUR) 60 MG 24 hr tablet Take 1.5 tablets (90 mg total) by mouth daily.  ? loratadine (CLARITIN) 10 MG tablet Take 1 tablet (10 mg total) by mouth daily.  ? methimazole (TAPAZOLE) 5 MG tablet Take 1 tablet by mouth once daily  ? methocarbamol (ROBAXIN) 500 MG tablet Take 1 tablet (500 mg total) by mouth every 8 (eight) hours as needed for muscle spasms.  ? methylPREDNISolone (MEDROL DOSEPAK) 4 MG TBPK tablet Take per package instructions  ? metoCLOPramide  (REGLAN) 5 MG tablet Take 1 tablet (5 mg total) by mouth 3 (three) times daily before meals.  ? metoprolol succinate (TOPROL-XL) 50 MG 24 hr tablet Take 1 tablet (50 mg total) by mouth daily. Take with or immediately following a meal.  ? nitroGLYCERIN (NITROSTAT) 0.4 MG SL tablet Place 1 tablet (0.4 mg total) under the tongue every 5 (five) minutes as needed for chest pain.  ? nystatin (MYCOSTATIN/NYSTOP) powder Apply 1 application topically 2 (two) times daily. Beneath both breasts (Patient taking differently: Apply 1 application. topically 2 (two) times daily as needed (irritation). Beneath both breasts)  ? ondansetron (ZOFRAN) 4 MG tablet Take 1 tab by mouth every 6 hours as needed for nausea.  ? potassium chloride SA (KLOR-CON M20) 20 MEQ tablet Take 1 tablet (20 mEq total) by mouth daily.  ? rosuvastatin (CRESTOR) 20 MG tablet Take 1.5 tablets (30 mg total) by mouth daily.  ? SUMAtriptan (IMITREX) 50 MG tablet Take 1 tablet (50 mg total) by mouth every 2 (  two) hours as needed for migraine. May repeat in 2 hours if headache persists or recurs.  ? tretinoin (RETIN-A) 0.1 % cream Apply topically at bedtime.  ? Vitamin D, Ergocalciferol, (DRISDOL) 1.25 MG (50000 UNIT) CAPS capsule Take 1 capsule (50,000 Units total) by mouth every 7 (seven) days.  ?  ? ?Allergies:   Ace inhibitors, Celebrex [celecoxib], and Diovan [valsartan]  ? ?Social History  ? ?Socioeconomic History  ? Marital status: Widowed  ?  Spouse name: Not on file  ? Number of children: 2  ? Years of education: Not on file  ? Highest education level: Not on file  ?Occupational History  ? Occupation: DISABILITY  ?  Employer: UNEMPLOYED  ?Tobacco Use  ? Smoking status: Former  ?  Packs/day: 0.50  ?  Years: 18.00  ?  Pack years: 9.00  ?  Types: Cigarettes  ?  Start date: 07/29/1977  ?  Quit date: 06/25/1994  ?  Years since quitting: 27.3  ? Smokeless tobacco: Never  ? Tobacco comments:  ?  quit 19 years ago  ?Vaping Use  ? Vaping Use: Never used  ?Substance and  Sexual Activity  ? Alcohol use: Yes  ?  Alcohol/week: 0.0 standard drinks  ?  Comment: social drinker  ? Drug use: No  ? Sexual activity: Not Currently  ?Other Topics Concern  ? Not on file  ?Social History Narrative  ? [place]

## 2021-10-27 ENCOUNTER — Ambulatory Visit (INDEPENDENT_AMBULATORY_CARE_PROVIDER_SITE_OTHER): Payer: Medicare Other | Admitting: Nurse Practitioner

## 2021-10-27 ENCOUNTER — Encounter: Payer: Self-pay | Admitting: Family

## 2021-10-27 ENCOUNTER — Ambulatory Visit (HOSPITAL_COMMUNITY)
Admission: RE | Admit: 2021-10-27 | Discharge: 2021-10-27 | Disposition: A | Discharge status: Home / Self Care | Payer: Medicare Other | Source: Ambulatory Visit | Attending: Gastroenterology | Admitting: Gastroenterology

## 2021-10-27 ENCOUNTER — Encounter: Payer: Self-pay | Admitting: Nurse Practitioner

## 2021-10-27 VITALS — BP 112/78 | HR 84 | Ht 64.0 in | Wt 224.8 lb

## 2021-10-27 DIAGNOSIS — I10 Essential (primary) hypertension: Secondary | ICD-10-CM | POA: Diagnosis not present

## 2021-10-27 DIAGNOSIS — K769 Liver disease, unspecified: Secondary | ICD-10-CM | POA: Diagnosis not present

## 2021-10-27 DIAGNOSIS — K760 Fatty (change of) liver, not elsewhere classified: Secondary | ICD-10-CM | POA: Diagnosis not present

## 2021-10-27 DIAGNOSIS — R002 Palpitations: Secondary | ICD-10-CM

## 2021-10-27 DIAGNOSIS — E785 Hyperlipidemia, unspecified: Secondary | ICD-10-CM

## 2021-10-27 DIAGNOSIS — R0602 Shortness of breath: Secondary | ICD-10-CM

## 2021-10-27 DIAGNOSIS — I25118 Atherosclerotic heart disease of native coronary artery with other forms of angina pectoris: Secondary | ICD-10-CM | POA: Diagnosis not present

## 2021-10-27 DIAGNOSIS — D1803 Hemangioma of intra-abdominal structures: Secondary | ICD-10-CM | POA: Diagnosis not present

## 2021-10-27 NOTE — Patient Instructions (Addendum)
Medication Instructions:  ? ?Your physician recommends that you continue on your current medications as directed. Please refer to the Current Medication list given to you today. ? ? ?*If you need a refill on your cardiac medications before your next appointment, please call your pharmacy* ? ?Lab Work: ? ?Your physician recommends that you return for a FASTING lipid profile/ Same day as echo. Friday, June 2. You can come in on the day of your appointment anytime between 7:30-4:30 fasting from midnight the night before.  ? ?If you have labs (blood work) drawn today and your tests are completely normal, you will receive your results only by: ?MyChart Message (if you have MyChart) OR ?A paper copy in the mail ?If you have any lab test that is abnormal or we need to change your treatment, we will call you to review the results. ? ? ?Testing/Procedures: ? ?Your physician has requested that you have an echocardiogram. Echocardiography is a painless test that uses sound waves to create images of your heart. It provides your doctor with information about the size and shape of your heart and how well your heart?s chambers and valves are working. This procedure takes approximately one hour. There are no restrictions for this procedure. ? ? ? ?Follow-Up: ?At [facility], you and your health needs are our priority.  As part of our continuing mission to provide you with exceptional heart care, we have created designated Provider Care Teams.  These Care Teams include your primary Cardiologist (physician) and Advanced Practice Providers (APPs -  Physician Assistants and Nurse Practitioners) who all work together to provide you with the care you need, when you need it. ? ?We recommend signing up for the patient portal called "MyChart".  Sign up information is provided on this After Visit Summary.  MyChart is used to connect with patients for Virtual Visits (Telemedicine).  Patients are able to view lab/test results, encounter  notes, upcoming appointments, etc.  Non-urgent messages can be sent to your provider as well.   ?To learn more about what you can do with MyChart, go to [email or web address].   ? ?Your next appointment:   ?7 month(s) ? ?The format for your next appointment:   ?In Person ? ?Provider:   ?[name], MD   ? ?Important Information About Sugar ? ? ? ? ?  ?

## 2021-10-30 NOTE — Telephone Encounter (Signed)
Patient is returning your call.  

## 2021-10-30 NOTE — Telephone Encounter (Signed)
Called patient.  The Patient was calling back regarding message I left her regarding result of RUQ ultrasound of liver.  See result note of US Abdomen Limited from 10-27-21.  Patient would like to have MRI of Liver to further evaluate hepatic hemangioma.  I will place order and have Radiology scheduling call patient to schedule at [facility].  She also indicated she IS taking 50 mg (2 tablets) qhs of the imipramine '25mg'$ .  Medication listed updated. ?

## 2021-10-30 NOTE — Addendum Note (Signed)
Addended by: Roetta Sessions on: 10/30/2021 05:58 PM ? ? Modules accepted: Orders ? ?

## 2021-10-31 ENCOUNTER — Other Ambulatory Visit: Payer: Self-pay | Admitting: Family

## 2021-11-01 ENCOUNTER — Telehealth: Payer: Self-pay

## 2021-11-01 NOTE — Telephone Encounter (Signed)
-----   Message from Roetta Sessions, [place] sent at 11/22/2020  2:32 PM EDT ----- ?Regarding: LFTs due ?LFTs due  ? ?

## 2021-11-03 ENCOUNTER — Encounter: Payer: Self-pay | Admitting: Family

## 2021-11-10 ENCOUNTER — Ambulatory Visit: Payer: Medicare Other | Admitting: Endocrinology

## 2021-11-17 ENCOUNTER — Other Ambulatory Visit: Payer: Medicare Other

## 2021-11-17 ENCOUNTER — Other Ambulatory Visit (HOSPITAL_COMMUNITY): Payer: Medicare Other

## 2021-11-23 ENCOUNTER — Telehealth: Payer: Self-pay

## 2021-11-23 NOTE — Telephone Encounter (Signed)
-----   Message from Marlon Pel, RN sent at 10/13/2021 10:36 AM EDT ----- Needs LFTs in June for [facility].  Orders are in

## 2021-11-23 NOTE — Telephone Encounter (Signed)
Called patient and LM that she is due for labs for Dr. Havery Moros.  Provided hours and location of lab in VM

## 2021-11-24 ENCOUNTER — Ambulatory Visit (HOSPITAL_COMMUNITY): Payer: Medicare Other | Attending: Cardiology

## 2021-11-24 ENCOUNTER — Other Ambulatory Visit: Payer: Medicare Other | Admitting: *Deleted

## 2021-11-24 ENCOUNTER — Ambulatory Visit (HOSPITAL_COMMUNITY): Admission: RE | Admit: 2021-11-24 | Payer: Medicare Other | Source: Ambulatory Visit

## 2021-11-24 ENCOUNTER — Ambulatory Visit: Payer: Medicare Other | Admitting: Family

## 2021-11-24 DIAGNOSIS — I25118 Atherosclerotic heart disease of native coronary artery with other forms of angina pectoris: Secondary | ICD-10-CM

## 2021-11-24 DIAGNOSIS — I10 Essential (primary) hypertension: Secondary | ICD-10-CM

## 2021-11-24 DIAGNOSIS — R002 Palpitations: Secondary | ICD-10-CM | POA: Diagnosis not present

## 2021-11-24 DIAGNOSIS — E785 Hyperlipidemia, unspecified: Secondary | ICD-10-CM | POA: Diagnosis not present

## 2021-11-24 LAB — HEPATIC FUNCTION PANEL
ALT: 28 IU/L (ref 0–32)
AST: 19 IU/L (ref 0–40)
Albumin: 4 g/dL (ref 3.8–4.8)
Alkaline Phosphatase: 140 IU/L — ABNORMAL HIGH (ref 44–121)
Bilirubin Total: <0.2 mg/dL (ref 0.0–1.2)
Bilirubin, Direct: <0.1 mg/dL (ref 0.00–0.40)
Total Protein: 6.6 g/dL (ref 6.0–8.5)

## 2021-11-24 LAB — LIPID PANEL
Chol/HDL Ratio: 1.7 ratio (ref 0.0–4.4)
Cholesterol, Total: 92 mg/dL — ABNORMAL LOW (ref 100–199)
HDL: 54 mg/dL (ref >39)
LDL Chol Calc (NIH): 21 mg/dL (ref 0–99)
Triglycerides: 89 mg/dL (ref 0–149)
VLDL Cholesterol Cal: 17 mg/dL (ref 5–40)

## 2021-11-24 LAB — ECHOCARDIOGRAM COMPLETE
Area-P 1/2: 3.48 cm2
S' Lateral: 3.5 cm

## 2021-11-30 ENCOUNTER — Telehealth: Payer: Self-pay

## 2021-11-30 ENCOUNTER — Other Ambulatory Visit: Payer: Self-pay

## 2021-11-30 DIAGNOSIS — E0590 Thyrotoxicosis, unspecified without thyrotoxic crisis or storm: Secondary | ICD-10-CM

## 2021-11-30 MED ORDER — METHIMAZOLE 5 MG PO TABS: 5.0000 mg | ORAL_TABLET | Freq: Every day | ORAL | 0 refills | Status: DC

## 2021-11-30 NOTE — Telephone Encounter (Signed)
-----   Message from Yetta Flock, MD sent at 11/30/2021  7:35 AM EDT ----- Regarding: RE: LFTs due Thanks Jan. LFTs stable but she has not followed up for liver MRI as previously recommended. Can you see if she is still interested in pursuing that? She needs to schedule it. Thanks  ----- Message ----- From: Roetta Sessions, CMA Sent: 11/29/2021  11:56 AM EDT To: Yetta Flock, MD Subject: FW: LFTs due                                   See LFT patient had 6-2   ----- Message ----- From: Roetta Sessions, CMA Sent: 11/29/2021  12:00 AM EDT To: Roetta Sessions, CMA Subject: FW: LFTs due                                   Patient is having labs with another provider on 6-5 (see order review).  Check to see if she had done and advise [facility] to review LFTs.  If not, have patient go to lab for LFTs   ----- Message ----- From: Roetta Sessions, CMA Sent: 11/01/2021  12:00 AM EDT To: Roetta Sessions, CMA Subject: LFTs due                                       LFTs due

## 2021-11-30 NOTE — Telephone Encounter (Signed)
Long wait time for scheduling. Sent message to scheduling to call patient to reschedule MRI which was originally scheduled for 6-2 but she no showed.

## 2021-12-01 ENCOUNTER — Telehealth: Payer: Self-pay | Admitting: Family

## 2021-12-01 ENCOUNTER — Encounter: Payer: Self-pay | Admitting: Family

## 2021-12-01 ENCOUNTER — Ambulatory Visit (INDEPENDENT_AMBULATORY_CARE_PROVIDER_SITE_OTHER): Payer: Medicare Other | Admitting: Family

## 2021-12-01 ENCOUNTER — Other Ambulatory Visit (INDEPENDENT_AMBULATORY_CARE_PROVIDER_SITE_OTHER): Payer: Medicare Other

## 2021-12-01 VITALS — BP 129/80 | HR 78 | Temp 97.8°F | Resp 16 | Wt 224.0 lb

## 2021-12-01 DIAGNOSIS — I10 Essential (primary) hypertension: Secondary | ICD-10-CM | POA: Diagnosis not present

## 2021-12-01 DIAGNOSIS — E559 Vitamin D deficiency, unspecified: Secondary | ICD-10-CM

## 2021-12-01 DIAGNOSIS — E119 Type 2 diabetes mellitus without complications: Secondary | ICD-10-CM

## 2021-12-01 DIAGNOSIS — E785 Hyperlipidemia, unspecified: Secondary | ICD-10-CM

## 2021-12-01 DIAGNOSIS — D509 Iron deficiency anemia, unspecified: Secondary | ICD-10-CM

## 2021-12-01 LAB — CBC WITH DIFFERENTIAL/PLATELET
Basophils Absolute: 0 10*3/uL (ref 0.0–0.1)
Basophils Relative: 0.2 % (ref 0.0–3.0)
Eosinophils Absolute: 0.1 10*3/uL (ref 0.0–0.7)
Eosinophils Relative: 1.6 % (ref 0.0–5.0)
HCT: 37.3 % (ref 36.0–46.0)
Hemoglobin: 11.5 g/dL — ABNORMAL LOW (ref 12.0–15.0)
Lymphocytes Relative: 35.9 % (ref 12.0–46.0)
Lymphs Abs: 2.5 10*3/uL (ref 0.7–4.0)
MCHC: 30.8 g/dL (ref 30.0–36.0)
MCV: 72.1 fl — ABNORMAL LOW (ref 78.0–100.0)
Monocytes Absolute: 0.5 10*3/uL (ref 0.1–1.0)
Monocytes Relative: 6.9 % (ref 3.0–12.0)
Neutro Abs: 3.8 10*3/uL (ref 1.4–7.7)
Neutrophils Relative %: 55.4 % (ref 43.0–77.0)
Platelets: 201 10*3/uL (ref 150.0–400.0)
RBC: 5.17 Mil/uL — ABNORMAL HIGH (ref 3.87–5.11)
RDW: 14.9 % (ref 11.5–15.5)
WBC: 6.9 10*3/uL (ref 4.0–10.5)

## 2021-12-01 LAB — COMPREHENSIVE METABOLIC PANEL
ALT: 26 U/L (ref 0–35)
AST: 20 U/L (ref 0–37)
Albumin: 4.1 g/dL (ref 3.5–5.2)
Alkaline Phosphatase: 120 U/L — ABNORMAL HIGH (ref 39–117)
BUN: 15 mg/dL (ref 6–23)
CO2: 26 mEq/L (ref 19–32)
Calcium: 9.3 mg/dL (ref 8.4–10.5)
Chloride: 105 mEq/L (ref 96–112)
Creatinine, Ser: 0.74 mg/dL (ref 0.40–1.20)
GFR: 86.23 mL/min (ref >60.00)
Glucose, Bld: 117 mg/dL — ABNORMAL HIGH (ref 70–99)
Potassium: 3.9 mEq/L (ref 3.5–5.1)
Sodium: 140 mEq/L (ref 135–145)
Total Bilirubin: 0.3 mg/dL (ref 0.2–1.2)
Total Protein: 7.3 g/dL (ref 6.0–8.3)

## 2021-12-01 LAB — HEMOGLOBIN A1C: Hgb A1c MFr Bld: 6.8 % — ABNORMAL HIGH (ref 4.6–6.5)

## 2021-12-01 LAB — LIPID PANEL
Cholesterol: 114 mg/dL (ref 0–200)
HDL: 50.1 mg/dL (ref >39.00)
LDL Cholesterol: 38 mg/dL (ref 0–99)
NonHDL: 64.14
Total CHOL/HDL Ratio: 2
Triglycerides: 131 mg/dL (ref 0.0–149.0)
VLDL: 26.2 mg/dL (ref 0.0–40.0)

## 2021-12-01 LAB — BASIC METABOLIC PANEL
BUN: 14 mg/dL (ref 6–23)
CO2: 27 mEq/L (ref 19–32)
Calcium: 9.1 mg/dL (ref 8.4–10.5)
Chloride: 104 mEq/L (ref 96–112)
Creatinine, Ser: 0.78 mg/dL (ref 0.40–1.20)
GFR: 80.95 mL/min (ref >60.00)
Glucose, Bld: 120 mg/dL — ABNORMAL HIGH (ref 70–99)
Potassium: 4.1 mEq/L (ref 3.5–5.1)
Sodium: 140 mEq/L (ref 135–145)

## 2021-12-01 LAB — MICROALBUMIN / CREATININE URINE RATIO
Creatinine,U: 123.7 mg/dL
Microalb Creat Ratio: 0.6 mg/g (ref 0.0–30.0)
Microalb, Ur: <0.7 mg/dL (ref 0.0–1.9)

## 2021-12-01 LAB — IRON: Iron: 63 ug/dL (ref 42–145)

## 2021-12-01 LAB — VITAMIN D 25 HYDROXY (VIT D DEFICIENCY, FRACTURES): VITD: 29.4 ng/mL — ABNORMAL LOW (ref 30.00–100.00)

## 2021-12-01 LAB — FERRITIN: Ferritin: 837.7 ng/mL — ABNORMAL HIGH (ref 10.0–291.0)

## 2021-12-01 MED ORDER — SUMATRIPTAN SUCCINATE 50 MG PO TABS: 50.0000 mg | ORAL_TABLET | ORAL | 5 refills | Status: DC | PRN

## 2021-12-01 MED ORDER — VITAMIN D3 75 MCG (3000 UT) PO TABS
1.0000 | ORAL_TABLET | Freq: Every day | ORAL | Status: DC
Start: 2021-12-01 — End: 2023-06-27

## 2021-12-01 MED ORDER — TETANUS-DIPHTHERIA TOXOIDS TD 2-2 LF/0.5ML IM SUSP: 0.5000 mL | Freq: Once | INTRAMUSCULAR | 0 refills | Status: AC

## 2021-12-01 NOTE — Assessment & Plan Note (Signed)
BP Readings from Last 3 Encounters:  12/01/21 129/80  10/27/21 112/78  10/13/21 130/64   At goal on amlodipine '10mg'$ .

## 2021-12-01 NOTE — Telephone Encounter (Signed)
No need to mail letter I wrote. Please call pt and let her know that I reviewed her lab work and vitamin D is mildly low. Add vit D 3000 iu over the counter once daily.   Sugar and cholesterol look good.

## 2021-12-01 NOTE — Telephone Encounter (Signed)
Called but no answer, lvm for patient to call back 

## 2021-12-01 NOTE — Assessment & Plan Note (Signed)
Not currently on supplement.  Will recheck.

## 2021-12-01 NOTE — Progress Notes (Addendum)
Subjective:   By signing my name below, I, [name], attest that this documentation has been prepared under the direction and in the presence of [name], 12/01/2021   Patient ID: [name], female    DOB: 1961-04-01, 61 y.o.   MRN: [id number]  Chief Complaint  Patient presents with   Hypertension    Here for follow up   Migraine    Doing well needs refill on sumatriptan    Patient is in today for an office visit.  Refills - She is requesting a refill of 50 Mg of Imitrex. Blood Pressure - As of today's visit, her blood pressure is normal. She is currently taking 10 Mg of Amlodipine.  BP Readings from Last 3 Encounters:  12/01/21 129/80  10/27/21 112/78  10/13/21 130/64   Pulse Readings from Last 3 Encounters:  12/01/21 78  10/27/21 84  10/13/21 66   Breathing - She reports that she has not had the need to use 108 MCG/ACT of Albuterol.  Migraines - She reports that she has been experiencing some migraines. She is currently taking 50 Mg of Imitrex.  Iron - She is requesting to get her iron levels checked.  Immunizations - She is interested in receiving a tetanus vaccine.   Health Maintenance Due  Topic Date Due   OPHTHALMOLOGY EXAM  Never done   FOOT EXAM  05/30/2013   URINE MICROALBUMIN  02/07/2021   TETANUS/TDAP  04/29/2021   HEMOGLOBIN A1C  11/24/2021    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/08- benign   Iron deficiency anemia    Migraine headache    Non-compliance    Peptic ulcer  disease    Thyroid disease     Past Surgical History:  Procedure Laterality Date   ABDOMINAL EXPLORATION SURGERY     w/bso    BREAST BIOPSY     CARDIAC CATHETERIZATION  2009   mild non obstructive CAD   CHOLECYSTECTOMY     COLONOSCOPY     ESOPHAGEAL MANOMETRY N/A 08/01/2020   Procedure: ESOPHAGEAL MANOMETRY (EM);  Surgeon: Yetta Flock, MD;  Location: [facility];  Service: Gastroenterology;  Laterality: N/A;   KNEE SURGERY  2005    left knee   LEFT HEART CATH AND CORONARY ANGIOGRAPHY N/A 07/17/2017   Procedure: LEFT HEART CATH AND CORONARY ANGIOGRAPHY;  Surgeon: Martinique, Peter M, MD;  Location: [facility];  Service: Cardiovascular;  Laterality: N/A;   LEFT HEART CATH AND CORONARY ANGIOGRAPHY N/A 09/01/2021   Procedure: LEFT HEART CATH AND CORONARY ANGIOGRAPHY;  Surgeon: Burnell Blanks, MD;  Location: [facility];  Service: Cardiovascular;  Laterality: N/A;   POLYPECTOMY     TOTAL ABDOMINAL HYSTERECTOMY     UPPER GASTROINTESTINAL ENDOSCOPY      Family History  Problem Relation Age of Onset   Hypertension Mother    Rheum arthritis Mother    Hypertension Father  Diabetes Father    Prostate cancer Father    Kidney disease Father    Diabetes Sister    Fibromyalgia Sister    Diabetes Maternal Grandmother    Lung cancer Maternal Grandfather    Arthritis Brother    Migraines Brother    CAD Brother    Fibromyalgia Sister    Migraines Sister    Colon cancer Neg Hx    Thyroid disease Neg Hx    Colon polyps Neg Hx     Social History   Socioeconomic History   Marital status: Widowed    Spouse name: Not on file   Number of children: 2   Years of education: Not on file   Highest education level: Not on file  Occupational History   Occupation: DISABILITY    Employer: UNEMPLOYED  Tobacco Use   Smoking status: Former    Packs/day: 0.50    Years: 18.00    Total pack years: 9.00    Types: Cigarettes    Start date: 07/29/1977    Quit date:  06/25/1994    Years since quitting: 27.4   Smokeless tobacco: Never   Tobacco comments:    quit 19 years ago  Vaping Use   Vaping Use: Never used  Substance and Sexual Activity   Alcohol use: Yes    Alcohol/week: 0.0 standard drinks of alcohol    Comment: social drinker   Drug use: No   Sexual activity: Not Currently  Other Topics Concern   Not on file  Social History Narrative   Widowed   Has 2 grown children.  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    Former Smoker Quit tobacco in 1996.  She was a pack a day smoker for approximately 10 years.    Alcohol use-yes: Social    Daily Caffeine Use:6 pack of pepsi daily     Illicit Drug Use - no    Patient does not get regular exercise.       Smoking Status:  quit   Social Determinants of Health   Financial Resource Strain: Low Risk  (06/02/2021)   Overall Financial Resource Strain (CARDIA)    Difficulty of Paying Living Expenses: Not hard at all  Food Insecurity: No Food Insecurity (06/02/2021)   Hunger Vital Sign    Worried About Running Out of Food in the Last Year: Never true    Ran Out of Food in the Last Year: Never true  Transportation Needs: No Transportation Needs (06/02/2021)   PRAPARE - Hydrologist (Medical): No    Lack of Transportation (Non-Medical): No  Physical Activity: Insufficiently Active (06/02/2021)   Exercise Vital Sign    Days of Exercise per Week: 2 days    Minutes of Exercise per Session: 30 min  Stress: No Stress Concern Present (06/02/2021)   [place]    Feeling of Stress : Not at all  Social Connections: Moderately Isolated (06/02/2021)   Social Connection and Isolation Panel [NHANES]    Frequency of Communication with Friends and Family: More than three times a week    Frequency of Social Gatherings with Friends and Family: More than three times a week    Attends  Religious Services: More than 4 times per year    Active Member of [employer] or Organizations: No    Attends Archivist Meetings: Never    Marital Status: Widowed  Intimate  Partner Violence: Not At Risk (06/02/2021)   Humiliation, Afraid, Rape, and Kick questionnaire    Fear of Current or Ex-Partner: No    Emotionally Abused: No    Physically Abused: No    Sexually Abused: No    Outpatient Medications Prior to Visit  Medication Sig Dispense Refill   albuterol (VENTOLIN HFA) 108 (90 Base) MCG/ACT inhaler INHALE 2 PUFFS BY MOUTH 4 TIMES DAILY (Patient taking differently: Inhale 2 puffs into the lungs every 6 (six) hours as needed for wheezing or shortness of breath.) 9 g 0   alclomethasone (ACLOVATE) 0.05 % cream Apply topically 2 (two) times daily as needed (Rash). 180 g 3   amLODipine (NORVASC) 10 MG tablet Take 1 tablet by mouth once daily 90 tablet 0   aspirin 81 MG chewable tablet Chew 1 tablet (81 mg total) by mouth daily.     clobetasol (TEMOVATE) 0.05 % external solution Apply 1 application topically 2 (two) times daily. (Patient taking differently: Apply 1 application  topically 2 (two) times daily as needed (scalp irritation).) 50 mL 6   clotrimazole-betamethasone (LOTRISONE) cream Apply 1 application topically 2 (two) times daily. (Patient taking differently: Apply 1 application  topically 2 (two) times daily as needed (irritation).) 30 g 0   Evolocumab (REPATHA SURECLICK) 323 MG/ML SOAJ Inject 140 mg into the skin every 14 (fourteen) days. 2 mL 11   fluticasone (FLONASE) 50 MCG/ACT nasal spray Place 2 sprays into both nostrils daily. (Patient taking differently: Place 2 sprays into both nostrils daily as needed for allergies.) 16 g 1   hyoscyamine (LEVSIN) 0.125 MG tablet Take 1 tablet (0.125 mg total) by mouth every 4 (four) hours as needed for cramping (diarrhea,nausea). 90 tablet 1   imipramine (TOFRANIL) 25 MG tablet Take 2 tablets (50 mg total) by mouth at bedtime. 60  tablet 1   isosorbide mononitrate (IMDUR) 60 MG 24 hr tablet Take 1.5 tablets (90 mg total) by mouth daily. 135 tablet 3   loratadine (CLARITIN) 10 MG tablet Take 1 tablet (10 mg total) by mouth daily. 10 tablet 0   methimazole (TAPAZOLE) 5 MG tablet Take 1 tablet (5 mg total) by mouth daily. 30 tablet 0   methocarbamol (ROBAXIN) 500 MG tablet Take 1 tablet (500 mg total) by mouth every 8 (eight) hours as needed for muscle spasms. 30 tablet 0   metoCLOPramide (REGLAN) 5 MG tablet Take 1 tablet (5 mg total) by mouth 3 (three) times daily before meals. 90 tablet 0   metoprolol succinate (TOPROL-XL) 50 MG 24 hr tablet Take 1 tablet (50 mg total) by mouth daily. Take with or immediately following a meal. 90 tablet 3   nitroGLYCERIN (NITROSTAT) 0.4 MG SL tablet Place 1 tablet (0.4 mg total) under the tongue every 5 (five) minutes as needed for chest pain. 25 tablet 3   nystatin (MYCOSTATIN/NYSTOP) powder Apply 1 application topically 2 (two) times daily. Beneath both breasts (Patient taking differently: Apply 1 application  topically 2 (two) times daily as needed (irritation). Beneath both breasts) 60 g 1   ondansetron (ZOFRAN) 4 MG tablet Take 1 tab by mouth every 6 hours as needed for nausea. 100 tablet 1   potassium chloride SA (KLOR-CON M20) 20 MEQ tablet Take 1 tablet (20 mEq total) by mouth daily. 30 tablet 11   rosuvastatin (CRESTOR) 20 MG tablet Take 1.5 tablets (30 mg total) by mouth daily. 135 tablet 1   tretinoin (RETIN-A) 0.1 % cream Apply topically at bedtime. 45 g 6  SUMAtriptan (IMITREX) 50 MG tablet Take 1 tablet (50 mg total) by mouth every 2 (two) hours as needed for migraine. May repeat in 2 hours if headache persists or recurs. 10 tablet 5   Vitamin D, Ergocalciferol, (DRISDOL) 1.25 MG (50000 UNIT) CAPS capsule Take 1 capsule (50,000 Units total) by mouth every 7 (seven) days. 6 capsule 0   benzonatate (TESSALON) 100 MG capsule Take 1 capsule (100 mg total) by mouth 3 (three) times  daily as needed. 20 capsule 0   fluconazole (DIFLUCAN) 150 MG tablet One tablet by mouth once weekly for 3 weeks. 3 tablet 0   methylPREDNISolone (MEDROL DOSEPAK) 4 MG TBPK tablet Take per package instructions 21 tablet 0   No facility-administered medications prior to visit.    Allergies  Allergen Reactions   Ace Inhibitors Cough   Celebrex [Celecoxib] Other (See Comments)    "makes me bleed"   Diovan [Valsartan]     angioedema    ROS See HPI    Objective:    Physical Exam Constitutional:      General: She is not in acute distress.    Appearance: Normal appearance. She is not ill-appearing.  HENT:     Head: Normocephalic and atraumatic.     Right Ear: External ear normal.     Left Ear: External ear normal.  Eyes:     Extraocular Movements: Extraocular movements intact.     Pupils: Pupils are equal, round, and reactive to light.  Cardiovascular:     Rate and Rhythm: Normal rate and regular rhythm.     Heart sounds: Normal heart sounds. No murmur heard.    No gallop.  Pulmonary:     Effort: Pulmonary effort is normal. No respiratory distress.     Breath sounds: Normal breath sounds. No wheezing or rales.  Skin:    General: Skin is warm and dry.  Neurological:     Mental Status: She is alert and oriented to person, place, and time.  Psychiatric:        Mood and Affect: Mood normal.        Behavior: Behavior normal.        Judgment: Judgment normal.     BP 129/80 (BP Location: Right Arm, Patient Position: Sitting, Cuff Size: Large)   Pulse 78   Temp 97.8 F (36.6 C) (Oral)   Resp 16   Wt 224 lb (101.6 kg)   SpO2 100%   BMI 38.45 kg/m  Wt Readings from Last 3 Encounters:  12/01/21 224 lb (101.6 kg)  10/27/21 224 lb 12.8 oz (102 kg)  10/13/21 220 lb (99.8 kg)       Assessment & Plan:   Problem List Items Addressed This Visit       Unprioritized   Vitamin D deficiency - Primary    Not currently on supplement.  Will recheck.       Relevant Orders    Vitamin D (25 hydroxy)   Iron deficiency anemia   Relevant Orders   CBC with Differential/Platelet   Iron   Ferritin   Hyperlipidemia    Lab Results  Component Value Date   CHOL 92 (L) 11/24/2021   HDL 54 11/24/2021   LDLCALC 21 11/24/2021   LDLDIRECT 76 07/21/2019   TRIG 89 11/24/2021   CHOLHDL 1.7 11/24/2021  Lipids stable on crestor. Continue same. Repeat lipid panel. See phone note.       HTN (hypertension)    BP Readings from Last 3 Encounters:  12/01/21  129/80  10/27/21 112/78  10/13/21 130/64  At goal on amlodipine '10mg'$ .       Relevant Orders   Basic metabolic panel   Controlled type 2 diabetes mellitus without complication, without long-term current use of insulin (HCC)    Will check A1C. (see phone note).       Relevant Orders   Urine Microalbumin w/creat. ratio    Meds ordered this encounter  Medications   SUMAtriptan (IMITREX) 50 MG tablet    Sig: Take 1 tablet (50 mg total) by mouth every 2 (two) hours as needed for migraine. May repeat in 2 hours if headache persists or recurs.    Dispense:  10 tablet    Refill:  5    Order Specific Question:   Supervising Provider    Answer:   Penni Homans A [[id number]]   diptheria-tetanus toxoids (DECAVAC) 2-2 LF/0.5ML injection    Sig: Inject 0.5 mLs into the muscle once for 1 dose.    Dispense:  0.5 mL    Refill:  0    Order Specific Question:   Supervising Provider    Answer:   Penni Homans A [[id number]]    I, [name], [name], personally preformed the services described in this documentation.  All medical record entries made by the scribe were at my direction and in my presence.  I have reviewed the chart and discharge instructions (if applicable) and agree that the record reflects my personal performance and is accurate and complete. 12/01/2021  I,Amber Collins,acting as a scribe for [name], [name].,have documented all relevant documentation on the behalf of [name], [name],as directed by   [name], [name] while in the presence of [name], [name].  [name], [name]

## 2021-12-01 NOTE — Telephone Encounter (Signed)
Never mind on the lab appointment, looks like everything got drawn. Tks.

## 2021-12-01 NOTE — Assessment & Plan Note (Addendum)
Lab Results  Component Value Date   CHOL 92 (L) 11/24/2021   HDL 54 11/24/2021   LDLCALC 21 11/24/2021   LDLDIRECT 76 07/21/2019   TRIG 89 11/24/2021   CHOLHDL 1.7 11/24/2021   Lipids stable on crestor. Continue same. Repeat lipid panel. See phone note.

## 2021-12-01 NOTE — Telephone Encounter (Signed)
No rush, but at her convenience I would like for her to complete some additional lab work. After she left I realized that we needed to update her diabetes testing and cholesterol. My apologies for the inconvenience.

## 2021-12-01 NOTE — Assessment & Plan Note (Signed)
Will check A1C. (see phone note).

## 2021-12-04 NOTE — Telephone Encounter (Signed)
Informed Pt of results and recommendations. Pt verbalized understanding.

## 2021-12-15 ENCOUNTER — Ambulatory Visit (HOSPITAL_COMMUNITY)
Admission: RE | Admit: 2021-12-15 | Discharge: 2021-12-15 | Disposition: A | Discharge status: Home / Self Care | Payer: Medicare Other | Source: Ambulatory Visit | Attending: Gastroenterology | Admitting: Gastroenterology

## 2021-12-15 DIAGNOSIS — D1803 Hemangioma of intra-abdominal structures: Secondary | ICD-10-CM | POA: Diagnosis not present

## 2021-12-15 DIAGNOSIS — K760 Fatty (change of) liver, not elsewhere classified: Secondary | ICD-10-CM | POA: Diagnosis not present

## 2021-12-15 MED ORDER — GADOBUTROL 1 MMOL/ML IV SOLN
10.0000 mL | Freq: Once | INTRAVENOUS | Status: AC | PRN
Start: 2021-12-15 — End: 2021-12-15
  Administered 2021-12-15: 10 mL via INTRAVENOUS

## 2021-12-19 ENCOUNTER — Other Ambulatory Visit: Payer: Self-pay

## 2021-12-19 DIAGNOSIS — R1011 Right upper quadrant pain: Secondary | ICD-10-CM

## 2021-12-19 DIAGNOSIS — K760 Fatty (change of) liver, not elsewhere classified: Secondary | ICD-10-CM

## 2021-12-19 DIAGNOSIS — D1803 Hemangioma of intra-abdominal structures: Secondary | ICD-10-CM

## 2021-12-20 ENCOUNTER — Other Ambulatory Visit: Payer: Self-pay | Admitting: Endocrinology

## 2021-12-20 DIAGNOSIS — E0590 Thyrotoxicosis, unspecified without thyrotoxic crisis or storm: Secondary | ICD-10-CM

## 2021-12-21 ENCOUNTER — Other Ambulatory Visit: Payer: Self-pay

## 2021-12-21 ENCOUNTER — Other Ambulatory Visit: Payer: Self-pay | Admitting: Endocrinology

## 2021-12-21 ENCOUNTER — Telehealth: Payer: Self-pay | Admitting: Family

## 2021-12-21 DIAGNOSIS — E0590 Thyrotoxicosis, unspecified without thyrotoxic crisis or storm: Secondary | ICD-10-CM

## 2021-12-21 MED ORDER — ROSUVASTATIN CALCIUM 20 MG PO TABS: 30.0000 mg | ORAL_TABLET | Freq: Every day | ORAL | 1 refills | Status: DC

## 2021-12-21 NOTE — Telephone Encounter (Signed)
Pharmacy wanted to make pcp aware that ins will only cover rosuvastatin once daily. They would like to know if pcp wants to change to 40 mg instead. Please advise.   [facility] ([address] [place] [facility] [place]   [address] ([place]) [place] [address]  Phone:  [phone]  Fax:  [phone]

## 2021-12-22 ENCOUNTER — Other Ambulatory Visit (INDEPENDENT_AMBULATORY_CARE_PROVIDER_SITE_OTHER): Payer: Medicare Other

## 2021-12-22 DIAGNOSIS — K760 Fatty (change of) liver, not elsewhere classified: Secondary | ICD-10-CM | POA: Diagnosis not present

## 2021-12-22 DIAGNOSIS — D1803 Hemangioma of intra-abdominal structures: Secondary | ICD-10-CM

## 2021-12-22 DIAGNOSIS — R1011 Right upper quadrant pain: Secondary | ICD-10-CM | POA: Diagnosis not present

## 2021-12-22 LAB — CBC WITH DIFFERENTIAL/PLATELET
Basophils Absolute: 0 10*3/uL (ref 0.0–0.1)
Basophils Relative: 0.3 % (ref 0.0–3.0)
Eosinophils Absolute: 0.1 10*3/uL (ref 0.0–0.7)
Eosinophils Relative: 0.8 % (ref 0.0–5.0)
HCT: 36.6 % (ref 36.0–46.0)
Hemoglobin: 11.4 g/dL — ABNORMAL LOW (ref 12.0–15.0)
Lymphocytes Relative: 38.5 % (ref 12.0–46.0)
Lymphs Abs: 2.7 10*3/uL (ref 0.7–4.0)
MCHC: 31.1 g/dL (ref 30.0–36.0)
MCV: 71.8 fl — ABNORMAL LOW (ref 78.0–100.0)
Monocytes Absolute: 0.4 10*3/uL (ref 0.1–1.0)
Monocytes Relative: 5.8 % (ref 3.0–12.0)
Neutro Abs: 3.8 10*3/uL (ref 1.4–7.7)
Neutrophils Relative %: 54.6 % (ref 43.0–77.0)
Platelets: 218 10*3/uL (ref 150.0–400.0)
RBC: 5.09 Mil/uL (ref 3.87–5.11)
RDW: 14.4 % (ref 11.5–15.5)
WBC: 7 10*3/uL (ref 4.0–10.5)

## 2021-12-22 LAB — HEPATIC FUNCTION PANEL
ALT: 17 U/L (ref 0–35)
AST: 14 U/L (ref 0–37)
Albumin: 4.1 g/dL (ref 3.5–5.2)
Alkaline Phosphatase: 105 U/L (ref 39–117)
Bilirubin, Direct: 0 mg/dL (ref 0.0–0.3)
Total Bilirubin: 0.2 mg/dL (ref 0.2–1.2)
Total Protein: 7.3 g/dL (ref 6.0–8.3)

## 2021-12-22 LAB — LIPASE: Lipase: 16 U/L (ref 11.0–59.0)

## 2021-12-22 MED ORDER — METOCLOPRAMIDE HCL 5 MG PO TABS: 5.0000 mg | ORAL_TABLET | Freq: Three times a day (TID) | ORAL | 0 refills | Status: DC

## 2021-12-22 MED ORDER — ROSUVASTATIN CALCIUM 40 MG PO TABS: 40.0000 mg | ORAL_TABLET | Freq: Every day | ORAL | 3 refills | Status: DC

## 2021-12-22 NOTE — Telephone Encounter (Signed)
Patient advied 1.5 of the 20 mg not cover by insurance. Ok per provider rx sent for 40 mg a day.

## 2021-12-22 NOTE — Telephone Encounter (Signed)
Please clarify with pharmacy- she is only taking in once a day '20mg'$  tabs (1.5 tabs once daily). I don't think she needs a higher dose. Are they not covering that dosing?

## 2021-12-29 ENCOUNTER — Other Ambulatory Visit (INDEPENDENT_AMBULATORY_CARE_PROVIDER_SITE_OTHER): Payer: Medicare Other

## 2021-12-29 DIAGNOSIS — E0590 Thyrotoxicosis, unspecified without thyrotoxic crisis or storm: Secondary | ICD-10-CM | POA: Diagnosis not present

## 2021-12-29 LAB — TSH: TSH: 4.87 u[IU]/mL (ref 0.35–5.50)

## 2021-12-29 LAB — T4, FREE: Free T4: 0.76 ng/dL (ref 0.60–1.60)

## 2021-12-30 LAB — THYROTROPIN RECEPTOR AUTOABS: Thyrotropin Receptor Ab: <1.1 IU/L (ref 0.00–1.75)

## 2022-01-05 ENCOUNTER — Ambulatory Visit: Payer: BC Managed Care – PPO | Admitting: Endocrinology

## 2022-01-12 ENCOUNTER — Ambulatory Visit (INDEPENDENT_AMBULATORY_CARE_PROVIDER_SITE_OTHER): Payer: Medicare Other | Admitting: Family

## 2022-01-12 VITALS — BP 140/70 | HR 68 | Temp 98.1°F | Resp 18 | Ht 64.0 in | Wt 226.0 lb

## 2022-01-12 DIAGNOSIS — I10 Essential (primary) hypertension: Secondary | ICD-10-CM

## 2022-01-12 DIAGNOSIS — L989 Disorder of the skin and subcutaneous tissue, unspecified: Secondary | ICD-10-CM | POA: Diagnosis not present

## 2022-01-12 DIAGNOSIS — R609 Edema, unspecified: Secondary | ICD-10-CM | POA: Diagnosis not present

## 2022-01-12 MED ORDER — AMLODIPINE BESYLATE 10 MG PO TABS: 5.0000 mg | ORAL_TABLET | Freq: Every day | ORAL | 0 refills | Status: DC

## 2022-01-12 MED ORDER — METOPROLOL SUCCINATE ER 100 MG PO TB24: 100.0000 mg | ORAL_TABLET | Freq: Every day | ORAL | 1 refills | Status: DC

## 2022-01-12 NOTE — Assessment & Plan Note (Signed)
Likely secondary to amlodipine side effect.  See HTN.

## 2022-01-12 NOTE — Patient Instructions (Signed)
Decrease amlodipine to 1/2 tab once daily. Increase metoprolol to '100mg'$  once daily.

## 2022-01-12 NOTE — Assessment & Plan Note (Signed)
BP Readings from Last 3 Encounters:  01/12/22 140/70  12/01/21 129/80  10/27/21 112/78   Will adjust medications as follows:  Decrease amlodipine to 1/2 tab once daily. Increase metoprolol to '100mg'$  once daily.  Follow up in 2 weeks.

## 2022-01-12 NOTE — Progress Notes (Signed)
Subjective:   By signing my name below, I, [name], attest that this documentation has been prepared under the direction and in the presence of Karie Chimera, NP 01/12/2022   Patient ID: [name], female    DOB: July 15, 1960, 61 y.o.   MRN: [id number]  Chief Complaint  Patient presents with   Edema    Both legs    HPI Patient is in today for an office visit   Swollen Feet - She complains of swollen feet. She states that the swelling worsens as the day gets later. She is currently taking 10 Mg of Amlodipine and 50 Mg of Metoprolol Succinate.   Skin lesion-She also reports a lesion on her RLE. She has a regular dermatologist and is interested in setting up an appointment with them. BP Readings from Last 3 Encounters:  01/12/22 140/70  12/01/21 129/80  10/27/21 112/78   Pulse Readings from Last 3 Encounters:  01/12/22 68  12/01/21 78  10/27/21 84   Thyroid - She is following up with Dr. Dwyane Dee for her thyroid.  Lab Results  Component Value Date   TSH 4.87 12/29/2021   T4TOTAL 12.3 11/28/2011    Health Maintenance Due  Topic Date Due   OPHTHALMOLOGY EXAM  Never done   FOOT EXAM  05/30/2013   TETANUS/TDAP  04/29/2021    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/08- benign   Iron deficiency anemia    Migraine headache    Non-compliance    Peptic ulcer disease    Thyroid disease     Past Surgical History:  Procedure Laterality Date   ABDOMINAL EXPLORATION SURGERY     w/bso    BREAST BIOPSY      CARDIAC CATHETERIZATION  2009   mild non obstructive CAD   CHOLECYSTECTOMY     COLONOSCOPY     ESOPHAGEAL MANOMETRY N/A 08/01/2020   Procedure: ESOPHAGEAL MANOMETRY (EM);  Surgeon: Yetta Flock, MD;  Location: [facility];  Service: Gastroenterology;  Laterality: N/A;   KNEE SURGERY  2005    left knee   LEFT HEART CATH AND CORONARY ANGIOGRAPHY N/A 07/17/2017   Procedure: LEFT HEART CATH AND CORONARY ANGIOGRAPHY;  Surgeon: Martinique, Peter M, MD;  Location: [facility] [facility];  Service: Cardiovascular;  Laterality: N/A;   LEFT HEART CATH AND CORONARY ANGIOGRAPHY N/A 09/01/2021   Procedure: LEFT HEART CATH AND CORONARY ANGIOGRAPHY;  Surgeon: Burnell Blanks, MD;  Location: [place] [facility];  Service: Cardiovascular;  Laterality: N/A;   POLYPECTOMY     TOTAL ABDOMINAL HYSTERECTOMY     UPPER GASTROINTESTINAL ENDOSCOPY      Family History  Problem Relation Age of Onset   Hypertension Mother    Rheum arthritis Mother    Hypertension Father    Diabetes Father    Prostate cancer Father    Kidney disease Father    Diabetes Sister    Fibromyalgia Sister  Diabetes Maternal Grandmother    Lung cancer Maternal Grandfather    Arthritis Brother    Migraines Brother    CAD Brother    Fibromyalgia Sister    Migraines Sister    Colon cancer Neg Hx    Thyroid disease Neg Hx    Colon polyps Neg Hx     Social History   Socioeconomic History   Marital status: Widowed    Spouse name: Not on file   Number of children: 2   Years of education: Not on file   Highest education level: Not on file  Occupational History   Occupation: DISABILITY    Employer: UNEMPLOYED  Tobacco Use   Smoking status: Former    Packs/day: 0.50    Years: 18.00    Total pack years: 9.00    Types: Cigarettes    Start date: 07/29/1977    Quit date: 06/25/1994    Years since quitting: 27.5   Smokeless tobacco: Never   Tobacco comments:    quit 19 years ago  Vaping Use   Vaping Use: Never used   Substance and Sexual Activity   Alcohol use: Yes    Alcohol/week: 0.0 standard drinks of alcohol    Comment: social drinker   Drug use: No   Sexual activity: Not Currently  Other Topics Concern   Not on file  Social History Narrative   Widowed   Has 2 grown children.  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    Former Smoker Quit tobacco in 1996.  She was a pack a day smoker for approximately 10 years.    Alcohol use-yes: Social    Daily Caffeine Use:6 pack of pepsi daily     Illicit Drug Use - no    Patient does not get regular exercise.       Smoking Status:  quit   Social Determinants of Health   Financial Resource Strain: Low Risk  (06/02/2021)   Overall Financial Resource Strain (CARDIA)    Difficulty of Paying Living Expenses: Not hard at all  Food Insecurity: No Food Insecurity (06/02/2021)   Hunger Vital Sign    Worried About Running Out of Food in the Last Year: Never true    Ran Out of Food in the Last Year: Never true  Transportation Needs: No Transportation Needs (06/02/2021)   PRAPARE - Hydrologist (Medical): No    Lack of Transportation (Non-Medical): No  Physical Activity: Insufficiently Active (06/02/2021)   Exercise Vital Sign    Days of Exercise per Week: 2 days    Minutes of Exercise per Session: 30 min  Stress: No Stress Concern Present (06/02/2021)   [place]    Feeling of Stress : Not at all  Social Connections: Moderately Isolated (06/02/2021)   Social Connection and Isolation Panel [NHANES]    Frequency of Communication with Friends and Family: More than three times a week    Frequency of Social Gatherings with Friends and Family: More than three times a week    Attends Religious Services: More than 4 times per year    Active Member of [employer] or Organizations: No    Attends Archivist Meetings: Never     Marital Status: Widowed  Intimate Partner Violence: Not At Risk (06/02/2021)   Humiliation, Afraid, Rape, and Kick questionnaire    Fear of Current or Ex-Partner: No    Emotionally  Abused: No    Physically Abused: No    Sexually Abused: No    Outpatient Medications Prior to Visit  Medication Sig Dispense Refill   albuterol (VENTOLIN HFA) 108 (90 Base) MCG/ACT inhaler INHALE 2 PUFFS BY MOUTH 4 TIMES DAILY (Patient taking differently: Inhale 2 puffs into the lungs every 6 (six) hours as needed for wheezing or shortness of breath.) 9 g 0   alclomethasone (ACLOVATE) 0.05 % cream Apply topically 2 (two) times daily as needed (Rash). 180 g 3   aspirin 81 MG chewable tablet Chew 1 tablet (81 mg total) by mouth daily.     Cholecalciferol (VITAMIN D3) 75 MCG (3000 UT) TABS Take 1 tablet by mouth daily. 30 tablet    clobetasol (TEMOVATE) 0.05 % external solution Apply 1 application topically 2 (two) times daily. (Patient taking differently: Apply 1 application  topically 2 (two) times daily as needed (scalp irritation).) 50 mL 6   clotrimazole-betamethasone (LOTRISONE) cream Apply 1 application topically 2 (two) times daily. (Patient taking differently: Apply 1 application  topically 2 (two) times daily as needed (irritation).) 30 g 0   Evolocumab (REPATHA SURECLICK) 010 MG/ML SOAJ Inject 140 mg into the skin every 14 (fourteen) days. 2 mL 11   fluticasone (FLONASE) 50 MCG/ACT nasal spray Place 2 sprays into both nostrils daily. (Patient taking differently: Place 2 sprays into both nostrils daily as needed for allergies.) 16 g 1   hyoscyamine (LEVSIN) 0.125 MG tablet Take 1 tablet (0.125 mg total) by mouth every 4 (four) hours as needed for cramping (diarrhea,nausea). 90 tablet 1   imipramine (TOFRANIL) 25 MG tablet Take 2 tablets (50 mg total) by mouth at bedtime. 60 tablet 1   isosorbide mononitrate (IMDUR) 60 MG 24 hr tablet Take 1.5 tablets (90 mg total) by mouth daily. 135 tablet 3   loratadine  (CLARITIN) 10 MG tablet Take 1 tablet (10 mg total) by mouth daily. 10 tablet 0   methimazole (TAPAZOLE) 5 MG tablet Take 1 tablet by mouth once daily 30 tablet 0   methocarbamol (ROBAXIN) 500 MG tablet Take 1 tablet (500 mg total) by mouth every 8 (eight) hours as needed for muscle spasms. 30 tablet 0   metoCLOPramide (REGLAN) 5 MG tablet Take 1 tablet (5 mg total) by mouth 3 (three) times daily before meals. 90 tablet 0   nitroGLYCERIN (NITROSTAT) 0.4 MG SL tablet Place 1 tablet (0.4 mg total) under the tongue every 5 (five) minutes as needed for chest pain. 25 tablet 3   nystatin (MYCOSTATIN/NYSTOP) powder Apply 1 application topically 2 (two) times daily. Beneath both breasts (Patient taking differently: Apply 1 application  topically 2 (two) times daily as needed (irritation). Beneath both breasts) 60 g 1   ondansetron (ZOFRAN) 4 MG tablet Take 1 tab by mouth every 6 hours as needed for nausea. 100 tablet 1   potassium chloride SA (KLOR-CON M20) 20 MEQ tablet Take 1 tablet (20 mEq total) by mouth daily. 30 tablet 11   rosuvastatin (CRESTOR) 40 MG tablet Take 1 tablet (40 mg total) by mouth daily. 90 tablet 3   SUMAtriptan (IMITREX) 50 MG tablet Take 1 tablet (50 mg total) by mouth every 2 (two) hours as needed for migraine. May repeat in 2 hours if headache persists or recurs. 10 tablet 5   tretinoin (RETIN-A) 0.1 % cream Apply topically at bedtime. 45 g 6   amLODipine (NORVASC) 10 MG tablet Take 1 tablet by mouth once daily 90 tablet 0   metoprolol  succinate (TOPROL-XL) 50 MG 24 hr tablet Take 1 tablet (50 mg total) by mouth daily. Take with or immediately following a meal. 90 tablet 3   No facility-administered medications prior to visit.    Allergies  Allergen Reactions   Ace Inhibitors Cough   Celebrex [Celecoxib] Other (See Comments)    "makes me bleed"   Diovan [Valsartan]     angioedema    ROS See HPI    Objective:    Physical Exam Constitutional:      General: She is not  in acute distress.    Appearance: Normal appearance. She is not ill-appearing.  HENT:     Head: Normocephalic and atraumatic.     Right Ear: External ear normal.     Left Ear: External ear normal.  Eyes:     Extraocular Movements: Extraocular movements intact.     Pupils: Pupils are equal, round, and reactive to light.  Cardiovascular:     Rate and Rhythm: Normal rate and regular rhythm.     Heart sounds: Normal heart sounds. No murmur heard.    No gallop.  Pulmonary:     Effort: Pulmonary effort is normal. No respiratory distress.     Breath sounds: Normal breath sounds. No wheezing or rales.  Musculoskeletal:     Right lower leg: 2+ Edema present.     Left lower leg: 2+ Edema present.  Skin:    General: Skin is warm and dry.     Findings: Lesion present.     Comments: Dry raised lesion right shin  Neurological:     Mental Status: She is alert and oriented to person, place, and time.  Psychiatric:        Mood and Affect: Mood normal.        Behavior: Behavior normal.        Judgment: Judgment normal.     BP 140/70   Pulse 68   Temp 98.1 F (36.7 C)   Resp 18   Ht '5\' 4"'$  (1.626 m)   Wt 226 lb (102.5 kg)   SpO2 99%   BMI 38.79 kg/m  Wt Readings from Last 3 Encounters:  01/12/22 226 lb (102.5 kg)  12/01/21 224 lb (101.6 kg)  10/27/21 224 lb 12.8 oz (102 kg)       Assessment & Plan:   Problem List Items Addressed This Visit       Unprioritized   HTN (hypertension)    BP Readings from Last 3 Encounters:  01/12/22 140/70  12/01/21 129/80  10/27/21 112/78  Will adjust medications as follows:  Decrease amlodipine to 1/2 tab once daily. Increase metoprolol to '100mg'$  once daily.  Follow up in 2 weeks.       Relevant Medications   metoprolol succinate (TOPROL-XL) 100 MG 24 hr tablet   amLODipine (NORVASC) 10 MG tablet   Edema    Likely secondary to amlodipine side effect.  See HTN.      Other Visit Diagnoses     Skin lesion    -  Primary   Relevant  Orders   Ambulatory referral to Dermatology       Meds ordered this encounter  Medications   metoprolol succinate (TOPROL-XL) 100 MG 24 hr tablet    Sig: Take 1 tablet (100 mg total) by mouth daily. Take with or immediately following a meal.    Dispense:  90 tablet    Refill:  1    Order Specific Question:   Supervising Provider  Answer:   Penni Homans A [[id number]]   amLODipine (NORVASC) 10 MG tablet    Sig: Take 0.5 tablets (5 mg total) by mouth daily.    Dispense:  90 tablet    Refill:  0    Order Specific Question:   Supervising Provider    Answer:   Penni Homans A [[id number]]    I, [name], NP, personally preformed the services described in this documentation.  All medical record entries made by the scribe were at my direction and in my presence.  I have reviewed the chart and discharge instructions (if applicable) and agree that the record reflects my personal performance and is accurate and complete. 01/12/2022   I,Amber Collins,acting as a Education administrator for [name], NP.,have documented all relevant documentation on the behalf of [name], NP,as directed by  [name], NP while in the presence of [name], NP.   [name], NP

## 2022-01-26 ENCOUNTER — Ambulatory Visit (INDEPENDENT_AMBULATORY_CARE_PROVIDER_SITE_OTHER): Payer: BC Managed Care – PPO | Admitting: Family

## 2022-01-26 DIAGNOSIS — I10 Essential (primary) hypertension: Secondary | ICD-10-CM | POA: Diagnosis not present

## 2022-01-26 NOTE — Assessment & Plan Note (Addendum)
BP Readings from Last 3 Encounters:  01/26/22 131/65  01/12/22 140/70  12/01/21 129/80   BP is improved. Swelling is improved. Continue current dose of metoprolol and amlodipine.

## 2022-01-26 NOTE — Progress Notes (Signed)
Subjective:   By signing my name below, I, [name], attest that this documentation has been prepared under the direction and in the presence of Karie Chimera, NP 01/26/2022   Patient ID: [name], female    DOB: 01-15-1961, 61 y.o.   MRN: [id number]  Chief Complaint  Patient presents with   Back Pain   Vitamin D deficiency    Here for follow up    HPI  Patient is in today for an office visit  Blood Pressure: As of last visit, her Metoprolol Succinate was increased to 100 Mg and her Amlodipine was decreased to 5 Mg. She reports that since her last visit, her leg swelling decreasing. Her blood pressure reading during today's visit is 131/65.  BP Readings from Last 3 Encounters:  01/26/22 131/65  01/12/22 140/70  12/01/21 129/80   Pulse Readings from Last 3 Encounters:  01/26/22 61  01/12/22 68  12/01/21 78   Health Maintenance Due  Topic Date Due   OPHTHALMOLOGY EXAM  Never done   FOOT EXAM  05/30/2013   TETANUS/TDAP  04/29/2021   INFLUENZA VACCINE  01/23/2022    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/08- benign   Iron deficiency anemia    Migraine headache    Non-compliance    Peptic ulcer disease    Thyroid disease     Past Surgical History:  Procedure Laterality Date   ABDOMINAL EXPLORATION SURGERY     w/bso    BREAST BIOPSY     CARDIAC CATHETERIZATION  2009   mild non obstructive CAD   CHOLECYSTECTOMY     COLONOSCOPY     ESOPHAGEAL MANOMETRY N/A 08/01/2020   Procedure: ESOPHAGEAL  MANOMETRY (EM);  Surgeon: Yetta Flock, MD;  Location: [facility];  Service: Gastroenterology;  Laterality: N/A;   KNEE SURGERY  2005    left knee   LEFT HEART CATH AND CORONARY ANGIOGRAPHY N/A 07/17/2017   Procedure: LEFT HEART CATH AND CORONARY ANGIOGRAPHY;  Surgeon: Martinique, Peter M, MD;  Location: [facility];  Service: Cardiovascular;  Laterality: N/A;   LEFT HEART CATH AND CORONARY ANGIOGRAPHY N/A 09/01/2021   Procedure: LEFT HEART CATH AND CORONARY ANGIOGRAPHY;  Surgeon: Burnell Blanks, MD;  Location: [facility];  Service: Cardiovascular;  Laterality: N/A;   POLYPECTOMY     TOTAL ABDOMINAL HYSTERECTOMY     UPPER GASTROINTESTINAL ENDOSCOPY      Family History  Problem Relation Age of Onset   Hypertension Mother    Rheum arthritis Mother    Hypertension Father    Diabetes Father    Prostate cancer Father    Kidney disease Father    Diabetes Sister    Fibromyalgia Sister    Diabetes Maternal Grandmother    Lung cancer Maternal Grandfather    Arthritis Brother    Migraines Brother    CAD Brother    Fibromyalgia Sister  Migraines Sister    Colon cancer Neg Hx    Thyroid disease Neg Hx    Colon polyps Neg Hx     Social History   Socioeconomic History   Marital status: Widowed    Spouse name: Not on file   Number of children: 2   Years of education: Not on file   Highest education level: Not on file  Occupational History   Occupation: DISABILITY    Employer: UNEMPLOYED  Tobacco Use   Smoking status: Former    Packs/day: 0.50    Years: 18.00    Total pack years: 9.00    Types: Cigarettes    Start date: 07/29/1977    Quit date: 06/25/1994    Years since quitting: 27.6   Smokeless tobacco: Never   Tobacco comments:    quit 19 years ago  Vaping Use   Vaping Use: Never used  Substance and Sexual Activity   Alcohol use: Yes    Alcohol/week: 0.0 standard drinks of alcohol    Comment: social drinker   Drug use: No   Sexual activity:  Not Currently  Other Topics Concern   Not on file  Social History Narrative   Widowed   Has 2 grown children.  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    Former Smoker Quit tobacco in 1996.  She was a pack a day smoker for approximately 10 years.    Alcohol use-yes: Social    Daily Caffeine Use:6 pack of pepsi daily     Illicit Drug Use - no    Patient does not get regular exercise.       Smoking Status:  quit   Social Determinants of Health   Financial Resource Strain: Low Risk  (06/02/2021)   Overall Financial Resource Strain (CARDIA)    Difficulty of Paying Living Expenses: Not hard at all  Food Insecurity: No Food Insecurity (06/02/2021)   Hunger Vital Sign    Worried About Running Out of Food in the Last Year: Never true    Ran Out of Food in the Last Year: Never true  Transportation Needs: No Transportation Needs (06/02/2021)   PRAPARE - Hydrologist (Medical): No    Lack of Transportation (Non-Medical): No  Physical Activity: Insufficiently Active (06/02/2021)   Exercise Vital Sign    Days of Exercise per Week: 2 days    Minutes of Exercise per Session: 30 min  Stress: No Stress Concern Present (06/02/2021)   [place]    Feeling of Stress : Not at all  Social Connections: Moderately Isolated (06/02/2021)   Social Connection and Isolation Panel [NHANES]    Frequency of Communication with Friends and Family: More than three times a week    Frequency of Social Gatherings with Friends and Family: More than three times a week    Attends Religious Services: More than 4 times per year    Active Member of [employer] or Organizations: No    Attends Archivist Meetings: Never    Marital Status: Widowed  Intimate Partner Violence: Not At Risk (06/02/2021)   Humiliation, Afraid, Rape, and Kick questionnaire    Fear of Current or Ex-Partner: No     Emotionally Abused: No    Physically Abused: No    Sexually Abused: No    Outpatient Medications Prior to Visit  Medication Sig Dispense Refill   albuterol (VENTOLIN HFA) 108 (  90 Base) MCG/ACT inhaler INHALE 2 PUFFS BY MOUTH 4 TIMES DAILY (Patient taking differently: Inhale 2 puffs into the lungs every 6 (six) hours as needed for wheezing or shortness of breath.) 9 g 0   alclomethasone (ACLOVATE) 0.05 % cream Apply topically 2 (two) times daily as needed (Rash). 180 g 3   amLODipine (NORVASC) 10 MG tablet Take 0.5 tablets (5 mg total) by mouth daily. 90 tablet 0   aspirin 81 MG chewable tablet Chew 1 tablet (81 mg total) by mouth daily.     Cholecalciferol (VITAMIN D3) 75 MCG (3000 UT) TABS Take 1 tablet by mouth daily. 30 tablet    clobetasol (TEMOVATE) 0.05 % external solution Apply 1 application topically 2 (two) times daily. (Patient taking differently: Apply 1 application  topically 2 (two) times daily as needed (scalp irritation).) 50 mL 6   clotrimazole-betamethasone (LOTRISONE) cream Apply 1 application topically 2 (two) times daily. (Patient taking differently: Apply 1 application  topically 2 (two) times daily as needed (irritation).) 30 g 0   Evolocumab (REPATHA SURECLICK) 161 MG/ML SOAJ Inject 140 mg into the skin every 14 (fourteen) days. 2 mL 11   fluticasone (FLONASE) 50 MCG/ACT nasal spray Place 2 sprays into both nostrils daily. (Patient taking differently: Place 2 sprays into both nostrils daily as needed for allergies.) 16 g 1   hyoscyamine (LEVSIN) 0.125 MG tablet Take 1 tablet (0.125 mg total) by mouth every 4 (four) hours as needed for cramping (diarrhea,nausea). 90 tablet 1   imipramine (TOFRANIL) 25 MG tablet Take 2 tablets (50 mg total) by mouth at bedtime. 60 tablet 1   isosorbide mononitrate (IMDUR) 60 MG 24 hr tablet Take 1.5 tablets (90 mg total) by mouth daily. 135 tablet 3   loratadine (CLARITIN) 10 MG tablet Take 1 tablet (10 mg total) by mouth daily. 10 tablet  0   methimazole (TAPAZOLE) 5 MG tablet Take 1 tablet by mouth once daily 30 tablet 0   methocarbamol (ROBAXIN) 500 MG tablet Take 1 tablet (500 mg total) by mouth every 8 (eight) hours as needed for muscle spasms. 30 tablet 0   metoCLOPramide (REGLAN) 5 MG tablet Take 1 tablet (5 mg total) by mouth 3 (three) times daily before meals. 90 tablet 0   metoprolol succinate (TOPROL-XL) 100 MG 24 hr tablet Take 1 tablet (100 mg total) by mouth daily. Take with or immediately following a meal. 90 tablet 1   nitroGLYCERIN (NITROSTAT) 0.4 MG SL tablet Place 1 tablet (0.4 mg total) under the tongue every 5 (five) minutes as needed for chest pain. 25 tablet 3   nystatin (MYCOSTATIN/NYSTOP) powder Apply 1 application topically 2 (two) times daily. Beneath both breasts (Patient taking differently: Apply 1 application  topically 2 (two) times daily as needed (irritation). Beneath both breasts) 60 g 1   ondansetron (ZOFRAN) 4 MG tablet Take 1 tab by mouth every 6 hours as needed for nausea. 100 tablet 1   potassium chloride SA (KLOR-CON M20) 20 MEQ tablet Take 1 tablet (20 mEq total) by mouth daily. 30 tablet 11   rosuvastatin (CRESTOR) 40 MG tablet Take 1 tablet (40 mg total) by mouth daily. 90 tablet 3   SUMAtriptan (IMITREX) 50 MG tablet Take 1 tablet (50 mg total) by mouth every 2 (two) hours as needed for migraine. May repeat in 2 hours if headache persists or recurs. 10 tablet 5   tretinoin (RETIN-A) 0.1 % cream Apply topically at bedtime. 45 g 6   No facility-administered medications  prior to visit.    Allergies  Allergen Reactions   Ace Inhibitors Cough   Celebrex [Celecoxib] Other (See Comments)    "makes me bleed"   Diovan [Valsartan]     angioedema    ROS See HPI    Objective:    Physical Exam Constitutional:      General: She is not in acute distress.    Appearance: Normal appearance. She is not ill-appearing.  HENT:     Head: Normocephalic and atraumatic.     Right Ear: External ear  normal.     Left Ear: External ear normal.  Eyes:     Extraocular Movements: Extraocular movements intact.     Pupils: Pupils are equal, round, and reactive to light.  Cardiovascular:     Rate and Rhythm: Normal rate and regular rhythm.     Heart sounds: Normal heart sounds. No murmur heard.    No gallop.  Pulmonary:     Effort: Pulmonary effort is normal. No respiratory distress.     Breath sounds: Normal breath sounds. No wheezing or rales.  Skin:    General: Skin is warm and dry.  Neurological:     Mental Status: She is alert and oriented to person, place, and time.  Psychiatric:        Mood and Affect: Mood normal.        Behavior: Behavior normal.        Judgment: Judgment normal.     BP 131/65   Pulse 61   Temp 98.3 F (36.8 C) (Oral)   Resp 16   Wt 230 lb (104.3 kg)   SpO2 100%   BMI 39.48 kg/m  Wt Readings from Last 3 Encounters:  01/26/22 230 lb (104.3 kg)  01/12/22 226 lb (102.5 kg)  12/01/21 224 lb (101.6 kg)       Assessment & Plan:   Problem List Items Addressed This Visit       Unprioritized   Essential hypertension, benign    BP Readings from Last 3 Encounters:  01/26/22 131/65  01/12/22 140/70  12/01/21 129/80  BP is improved. Swelling is improved. Continue current dose of metoprolol and amlodipine.         No orders of the defined types were placed in this encounter.   I, [name], NP, personally preformed the services described in this documentation.  All medical record entries made by the scribe were at my direction and in my presence.  I have reviewed the chart and discharge instructions (if applicable) and agree that the record reflects my personal performance and is accurate and complete. 01/26/2022   I,Amber Collins,acting as a scribe for [name], NP.,have documented all relevant documentation on the behalf of [name], NP,as directed by  [name], NP while in the presence of [name], NP.    [name], NP

## 2022-02-03 LAB — POCT GLYCOSYLATED HEMOGLOBIN (HGB A1C): Hemoglobin-A1c: 6.2

## 2022-02-03 LAB — GLUCOSE, POCT (MANUAL RESULT ENTRY): POC Glucose: 168 mg/dl — AB (ref 70–99)

## 2022-02-28 ENCOUNTER — Telehealth: Payer: Self-pay | Admitting: Gastroenterology

## 2022-02-28 NOTE — Telephone Encounter (Signed)
Returned call to patient. Pt reports that she has been having lower left abdominal cramping since Sunday. Pt reports that on Sunday and yesterday she developed cold sweats, but no fever. Pt has been taking Levsin q4h PRN with no relief. Pt takes Tylenol and uses the heating pad to get some relief. Patient's last BM was yesterday. She is having regular stools, no diarrhea. She does not feel like she is emptying completely. She states that she has been taking Med 7, she takes 2 tablets in the AM and 2 tablets in the PM to help her bowels. She reports intermittent nausea for the past couple of weeks, no relied with Phenergan or Zofran. She is still taking Reglan TID.  Patient is eating and drinking fine. She also reports feeling like something is stuck in her esophagus, she states that she has had to have a dilation before. She also gets a nasty taste in her mouth that makes her lose her appetite. Please advise, thanks.

## 2022-02-28 NOTE — Telephone Encounter (Signed)
Difficult situation.  Patient has a known history of jackhammer esophagus and gastroparesis.  If she is eating and drinking fine, I do not think she has a food impaction.  I can see her back in the office to discuss long-term management.  These are chronic issues for her.  She had a history of colitis, perhaps ischemic colitis remotely, also with diverticulosis.  Diverticulitis is possible.  If she is not getting relief with Levsin and having severe pain we can give her some empiric antibiotics, could consider a course of Augmentin twice daily for 5 to 7 days although I am not sure what are treating without further evaluation..  Not sure if the APP's have any openings to be seen in the next day or 2, I am not in the office the rest of the week for clinic.  If she has severe pain, fever etc. that is worsening, she would need to go to the ED for further evaluation and imaging.

## 2022-02-28 NOTE — Telephone Encounter (Signed)
Called and spoke with patient regarding Dr. Doyne Keel recommendations. Denise Best, NP had a cancellation for tomorrow at 3 pm. Pt states that she will be able to come in at that time for follow up appt. Pt has been scheduled. Pt had no concerns at the end of the call.   Barbette Or, CMA notified that patient was added on day before.

## 2022-02-28 NOTE — Telephone Encounter (Signed)
Patient called, wants to talk to a nurse about her symptoms. Requesting a call as soon as possible. Please call to advise.

## 2022-03-01 ENCOUNTER — Other Ambulatory Visit (INDEPENDENT_AMBULATORY_CARE_PROVIDER_SITE_OTHER): Payer: Medicare Other

## 2022-03-01 ENCOUNTER — Encounter: Payer: Self-pay | Admitting: Nurse Practitioner

## 2022-03-01 ENCOUNTER — Ambulatory Visit (INDEPENDENT_AMBULATORY_CARE_PROVIDER_SITE_OTHER): Payer: Medicare Other | Admitting: Nurse Practitioner

## 2022-03-01 VITALS — BP 126/76 | HR 64 | Ht 64.0 in | Wt 232.0 lb

## 2022-03-01 DIAGNOSIS — D649 Anemia, unspecified: Secondary | ICD-10-CM

## 2022-03-01 DIAGNOSIS — R1032 Left lower quadrant pain: Secondary | ICD-10-CM

## 2022-03-01 DIAGNOSIS — R1012 Left upper quadrant pain: Secondary | ICD-10-CM

## 2022-03-01 LAB — CBC WITH DIFFERENTIAL/PLATELET
Basophils Absolute: 0 10*3/uL (ref 0.0–0.1)
Basophils Relative: 0.5 % (ref 0.0–3.0)
Eosinophils Absolute: 0.1 10*3/uL (ref 0.0–0.7)
Eosinophils Relative: 1.3 % (ref 0.0–5.0)
HCT: 34.7 % — ABNORMAL LOW (ref 36.0–46.0)
Hemoglobin: 10.8 g/dL — ABNORMAL LOW (ref 12.0–15.0)
Lymphocytes Relative: 38.7 % (ref 12.0–46.0)
Lymphs Abs: 2.7 10*3/uL (ref 0.7–4.0)
MCHC: 31.1 g/dL (ref 30.0–36.0)
MCV: 71.6 fl — ABNORMAL LOW (ref 78.0–100.0)
Monocytes Absolute: 0.6 10*3/uL (ref 0.1–1.0)
Monocytes Relative: 8.2 % (ref 3.0–12.0)
Neutro Abs: 3.5 10*3/uL (ref 1.4–7.7)
Neutrophils Relative %: 51.3 % (ref 43.0–77.0)
Platelets: 200 10*3/uL (ref 150.0–400.0)
RBC: 4.85 Mil/uL (ref 3.87–5.11)
RDW: 14.5 % (ref 11.5–15.5)
WBC: 6.9 10*3/uL (ref 4.0–10.5)

## 2022-03-01 LAB — COMPREHENSIVE METABOLIC PANEL
ALT: 18 U/L (ref 0–35)
AST: 17 U/L (ref 0–37)
Albumin: 3.8 g/dL (ref 3.5–5.2)
Alkaline Phosphatase: 106 U/L (ref 39–117)
BUN: 13 mg/dL (ref 6–23)
CO2: 26 mEq/L (ref 19–32)
Calcium: 8.8 mg/dL (ref 8.4–10.5)
Chloride: 104 mEq/L (ref 96–112)
Creatinine, Ser: 0.69 mg/dL (ref 0.40–1.20)
GFR: 92.34 mL/min (ref >60.00)
Glucose, Bld: 97 mg/dL (ref 70–99)
Potassium: 3.6 mEq/L (ref 3.5–5.1)
Sodium: 138 mEq/L (ref 135–145)
Total Bilirubin: 0.2 mg/dL (ref 0.2–1.2)
Total Protein: 7.1 g/dL (ref 6.0–8.3)

## 2022-03-01 LAB — IBC + FERRITIN
Ferritin: 906.7 ng/mL — ABNORMAL HIGH (ref 10.0–291.0)
Iron: 60 ug/dL (ref 42–145)
Saturation Ratios: 21.8 % (ref 20.0–50.0)
TIBC: 275.8 ug/dL (ref 250.0–450.0)
Transferrin: 197 mg/dL — ABNORMAL LOW (ref 212.0–360.0)

## 2022-03-01 LAB — LIPASE: Lipase: 16 U/L (ref 11.0–59.0)

## 2022-03-01 LAB — C-REACTIVE PROTEIN: CRP: 1.3 mg/dL (ref 0.5–20.0)

## 2022-03-01 NOTE — Progress Notes (Signed)
03/01/2022 [name] [id number] 1961/06/01   Chief Complaint: Left-sided abdominal pain  History of Present Illness: [name]. [name] is a 61 year old female with a past medical history of arthritis, hypertension, hyperlipidemia, nonobstructive coronary artery disease, hypothyroidism, migraine headaches, iron deficiency anemia, GERD, benign esophageal stenosis, 2 cm hiatal hernia, gastroparesis, hepatic hemangioma, hepatic steatosis. She is followed by Dr. Havery Moros.  She presents today with complaints of left-sided abdominal pain which is sharp and cramp-like and radiates to the left mid back which started 1 month ago.  She has chronic nausea which has not worsened.  No vomiting.  Her left-sided abdominal pain intermittently awakens her from sleep at night for which she uses a heating pad with some relief.  She had night sweats twice over the past 1 to 2 weeks.  No fevers.  No weight loss.  She is passing normal formed brown bowel movement daily.  No rectal bleeding or black stools.  She underwent a colonoscopy 07/16/2017 which showed diverticulosis otherwise was normal.  Flex sig in 2019 resulted in a poor prep without evidence of colitis.  She has a history of GERD, esophageal stenosis and a jackhammer esophagus.  She has chronic dysphagia which has worsened over the past month.  She describes having fluid and food which gets hung up in the midesophagus which occurs several days weekly.  During these episodes, food and liquid sits in the midesophagus for 5 minutes up to 1 hour at a time.  Sometimes she drinks water to push the stuck food down but the water comes right back out.  She has mid esophageal pain during these episodes.   As previously reviewed by Dr. Havery Moros, she has had intermittent dysphagia to both solids and liquids that are longstanding.  She underwent an EGD in January 2019 which showed a small hiatal hernia and a very mild benign stricture at the [place].  She underwent  balloon dilation to 20 mm but no mucosal rent was even noted.  She had no relief of dysphagia with that dilation.  Post procedure she had severe chest pain and was admitted to the hospital, had no damage to her esophagus but had a troponin elevation and cardiology thought she had a vasospastic cardiac event that led to her symptoms. Since that time she has been followed by Dr. Harrington Challenger in cardiology, she is on nitrates for this issue. She underwent a cardiac cath March 2023 which showed she had mild nonobstructive disease in the LAD, circumflex, RCA.  Unchanged from cardiac cath in 2019, thought to have mild to moderate CAD and medical management was recommended.  She has esophageal pain during times of dysphagia as noted above otherwise no chest pain.  No palpitations, dizziness or shortness of breath.  History of elevated alk phos level. Work-up with labs for autoimmune and PBC has been negative.  History of a large hepatic hemangioma since at least 2010.  Her most recent abdominal MRI was 12/15/2021 which showed the following:  1. Interval decrease in size of previous large cavernous hemangioma involving the right lobe of liver. There is new susceptibility artifact within this lesion which is favored to reflect interval internal hemorrhage. Given the somewhat atypical appearance of previous benign appearing liver hemangioma consider imaging with repeat MRI in 6 months to ensure stability. 2. Hepatic steatosis.  Dr. Havery Moros considered referring her to Dr. Barry Dienes with [facility] as it was unclear if she had internal bleeding from her hemangioma.  A repeat hepatic  panel was stable therefore surgical consultation was deferred with plans to repeat a liver MRI in 6 months.     Latest Ref Rng & Units 12/22/2021   12:29 PM 12/01/2021    7:42 AM 08/25/2021   10:34 AM  CBC  WBC 4.0 - 10.5 K/uL 7.0  6.9  6.9   Hemoglobin 12.0 - 15.0 g/dL 11.4  11.5  11.2   Hematocrit 36.0 - 46.0 % 36.6  37.3  36.9   Platelets  150.0 - 400.0 K/uL 218.0  201.0  265        Latest Ref Rng & Units 12/22/2021   12:29 PM 12/01/2021    9:00 AM 12/01/2021    7:42 AM  CMP  Glucose 70 - 99 mg/dL  117  120   BUN 6 - 23 mg/dL  15  14   Creatinine 0.40 - 1.20 mg/dL  0.74  0.78   Sodium 135 - 145 mEq/L  140  140   Potassium 3.5 - 5.1 mEq/L  3.9  4.1   Chloride 96 - 112 mEq/L  105  104   CO2 19 - 32 mEq/L  26  27   Calcium 8.4 - 10.5 mg/dL  9.3  9.1   Total Protein 6.0 - 8.3 g/dL 7.3  7.3    Total Bilirubin 0.2 - 1.2 mg/dL 0.2  0.3    Alkaline Phos 39 - 117 U/L 105  120    AST 0 - 37 U/L 14  20    ALT 0 - 35 U/L 17  26      PAST GI PROCEDURES:  Flex sig 04/10/2018 - Preparation of the colon was poor, inadquate for assessing for polyps. - Of the visualized mucosa in the left colon, it appeared normal without any inflammatory changes. Biopsied. Hervey Ard / angulated turn in the sigmoid colon Overall, ischemic colitis remains possible to account for prior symptoms although nothing overtly appreciated on today's exam.   EGD 1/22/20219 - Esophagogastric landmarks identified. - 2 cm hiatal hernia. - One suspected benign-appearing esophageal stenosis. Dilated to 54m without mucosal wrents. - Normal stomach. - A single suspected duodenal polyp versus less likely ampulla. Biopsied. - CHRONIC DUODENITIS WITH SURFACE GASTRIC FOVEOLAR METAPLASIA, CONSISTENT WITH PEPTIC DUODENITIS.   Colonoscopy 07/16/2017 - The perianal and digital rectal examinations were normal. - The terminal ileum appeared normal. - The colon was tortuous. - A few medium-mouthed diverticula were found in the sigmoid colon. - Internal hemorrhoids were found during retroflexion. - The exam was otherwise without abnormality.   GES abnormal 03/07/09 - delayed GES normal 02/03/10   Barium swallow 09/04/19 - IMPRESSION: Mild circumferential narrowing of the distal esophagus resulting in delayed esophageal clearance. A 13 mm barium tablet passes  without difficulty. Patient reported presence of typical symptoms during the Study.    Esophageal manometry 08/01/20 - Jackhammer esophagus   Current Outpatient Medications on File Prior to Visit  Medication Sig Dispense Refill   albuterol (VENTOLIN HFA) 108 (90 Base) MCG/ACT inhaler INHALE 2 PUFFS BY MOUTH 4 TIMES DAILY (Patient taking differently: Inhale 2 puffs into the lungs every 6 (six) hours as needed for wheezing or shortness of breath.) 9 g 0   alclomethasone (ACLOVATE) 0.05 % cream Apply topically 2 (two) times daily as needed (Rash). 180 g 3   amLODipine (NORVASC) 10 MG tablet Take 0.5 tablets (5 mg total) by mouth daily. 90 tablet 0   aspirin 81 MG chewable tablet Chew 1 tablet (81 mg total) by  mouth daily.     Cholecalciferol (VITAMIN D3) 75 MCG (3000 UT) TABS Take 1 tablet by mouth daily. 30 tablet    clobetasol (TEMOVATE) 0.05 % external solution Apply 1 application topically 2 (two) times daily. (Patient taking differently: Apply 1 application  topically 2 (two) times daily as needed (scalp irritation).) 50 mL 6   clotrimazole-betamethasone (LOTRISONE) cream Apply 1 application topically 2 (two) times daily. (Patient taking differently: Apply 1 application  topically 2 (two) times daily as needed (irritation).) 30 g 0   Evolocumab (REPATHA SURECLICK) 124 MG/ML SOAJ Inject 140 mg into the skin every 14 (fourteen) days. 2 mL 11   fluticasone (FLONASE) 50 MCG/ACT nasal spray Place 2 sprays into both nostrils daily. (Patient taking differently: Place 2 sprays into both nostrils daily as needed for allergies.) 16 g 1   hyoscyamine (LEVSIN) 0.125 MG tablet Take 1 tablet (0.125 mg total) by mouth every 4 (four) hours as needed for cramping (diarrhea,nausea). 90 tablet 1   imipramine (TOFRANIL) 25 MG tablet Take 2 tablets (50 mg total) by mouth at bedtime. 60 tablet 1   isosorbide mononitrate (IMDUR) 60 MG 24 hr tablet Take 1.5 tablets (90 mg total) by mouth daily. 135 tablet 3   loratadine  (CLARITIN) 10 MG tablet Take 1 tablet (10 mg total) by mouth daily. 10 tablet 0   methimazole (TAPAZOLE) 5 MG tablet Take 1 tablet by mouth once daily 30 tablet 0   methocarbamol (ROBAXIN) 500 MG tablet Take 1 tablet (500 mg total) by mouth every 8 (eight) hours as needed for muscle spasms. 30 tablet 0   metoCLOPramide (REGLAN) 5 MG tablet Take 1 tablet (5 mg total) by mouth 3 (three) times daily before meals. 90 tablet 0   metoprolol succinate (TOPROL-XL) 100 MG 24 hr tablet Take 1 tablet (100 mg total) by mouth daily. Take with or immediately following a meal. 90 tablet 1   nitroGLYCERIN (NITROSTAT) 0.4 MG SL tablet Place 1 tablet (0.4 mg total) under the tongue every 5 (five) minutes as needed for chest pain. 25 tablet 3   nystatin (MYCOSTATIN/NYSTOP) powder Apply 1 application topically 2 (two) times daily. Beneath both breasts (Patient taking differently: Apply 1 application  topically 2 (two) times daily as needed (irritation). Beneath both breasts) 60 g 1   ondansetron (ZOFRAN) 4 MG tablet Take 1 tab by mouth every 6 hours as needed for nausea. 100 tablet 1   potassium chloride SA (KLOR-CON M20) 20 MEQ tablet Take 1 tablet (20 mEq total) by mouth daily. 30 tablet 11   rosuvastatin (CRESTOR) 40 MG tablet Take 1 tablet (40 mg total) by mouth daily. 90 tablet 3   SUMAtriptan (IMITREX) 50 MG tablet Take 1 tablet (50 mg total) by mouth every 2 (two) hours as needed for migraine. May repeat in 2 hours if headache persists or recurs. 10 tablet 5   tretinoin (RETIN-A) 0.1 % cream Apply topically at bedtime. 45 g 6   No current facility-administered medications on file prior to visit.   Allergies  Allergen Reactions   Ace Inhibitors Cough   Celebrex [Celecoxib] Other (See Comments)    "makes me bleed"   Diovan [Valsartan]     angioedema   Current Medications, Allergies, Past Medical History, Past Surgical History, Family History and Social History were reviewed in [employer] record.  Review of Systems:   Constitutional: See HPI.  Respiratory: Negative for shortness of breath.   Cardiovascular: Negative for chest pain, palpitations  and leg swelling.  Gastrointestinal: See HPI.  Musculoskeletal: + Fibromyalgia aches and pains. Neurological: + Headaches.   Physical Exam: BP 126/76   Pulse 64   Ht 5' 4"  (1.626 m)   Wt 232 lb (105.2 kg)   BMI 39.82 kg/m   General: 61 year old female in no acute distress. Head: Normocephalic and atraumatic. Eyes: No scleral icterus. Conjunctiva pink . Ears: Normal auditory acuity. Mouth: Dentition intact. No ulcers or lesions.  Lungs: Clear throughout to auscultation. Heart: Regular rate and rhythm, no murmur. Abdomen: Soft, nondistended.  Tenderness to the  LUQ, LLQ and left flank area. Negative CVA tenderness. No masses or hepatomegaly. Normal bowel sounds x 4 quadrants.  Rectal: Deferred.  Musculoskeletal: Symmetrical with no gross deformities. Extremities: No edema. Neurological: Alert oriented x 4. No focal deficits.  Psychological: Alert and cooperative. Normal mood and affect  Assessment and Recommendations:  38) 61 year old female with LUQ/LLQ which radiates to the left flank and mid back x 4 weeks. Night sweats x 2 nights over the past week. No fevers.  -CBC, CMP, lipase, CRP, IBC and ferritin -CTAP with contrast -Push fluids, bland diet -Further recommendations to be determined after the above lab and CT results reviewed -Patient to present to the ED if she develops severe abdominal pain  2) History of GERD, jackhammer esophagus and chronic dysphagia with liquids and solid foods which has worsened over the past 4 weeks.  Her most recent EGD 06/2017 showed a small hiatal hernia and a very mild benign stricture at the GE junction which was dilated, she developed post procedure chest pain and was sent to the ED. She was seen by cardiology who thought she likely had a vasospastic cardiac event resulting in  chest pain.  Not on PPI therapy. -Defer repeat endoscopic evaluation for now -await CTAP results -Patient instructed to avoid large pieces of meat, bread and rice.  Cut food into very small pieces and chew food thoroughly.  3) History of IDA.  No overt GI bleeding. -CBC and iron panel as ordered above  4) Hepatic hemangioma.  Liver MRI 12/15/2021 showed an interval decrease in size of the large cavernous hemangioma to the right lobe of the liver with possible new internal hemorrhage, stable LFTs. -Follow-up abdominal MRI due to 05/2022  5) Nonobstructive coronary artery disease per cardiac cath 08/2021.

## 2022-03-01 NOTE — Patient Instructions (Addendum)
_______________________________________________________  If you are age 63 or older, your body mass index should be between 23-30. Your Body mass index is 39.82 kg/m. If this is out of the aforementioned range listed, please consider follow up with your Primary Care Provider.  If you are age 57 or younger, your body mass index should be between 19-25. Your Body mass index is 39.82 kg/m. If this is out of the aformentioned range listed, please consider follow up with your Primary Care Provider.   Your provider has requested that you go to the basement level for lab work before leaving today. Press "B" on the elevator. The lab is located at the first door on the left as you exit the elevator.  Go to the Emergency room if continue to have severe abdominal pain. Bland diet and push fluids.  You have been scheduled for a CT scan of the abdomen and pelvis at [facility], 1st floor Radiology. You are scheduled on 03/02/22 at [place] should arrive 15 minutes prior to your appointment time for registration.  We are giving you 2 bottles of contrast today that you will need to drink before arriving for the exam. The solution may taste better if refrigerated so put them in the refrigerator when you get home, but do NOT add ice or any other liquid to this solution as that would dilute it. Shake well before drinking.   Please follow the written instructions below on the day of your exam:   1) Do not eat anything after 6am (4 hours prior to your test)   2) Drink 1 bottle of contrast @ 8am (2 hours prior to your exam)  Remember to shake well before drinking and do NOT pour over ice.     Drink 1 bottle of contrast @ 9am (1 hour prior to your exam)   You may take any medications as prescribed with a small amount of water, if necessary. If you take any of the following medications: METFORMIN, GLUCOPHAGE, GLUCOVANCE, AVANDAMET, RIOMET, FORTAMET, [place] MET, JANUMET, GLUMETZA or METAGLIP, you MAY be asked  to HOLD this medication 48 hours AFTER the exam.   The purpose of you drinking the oral contrast is to aid in the visualization of your intestinal tract. The contrast solution may cause some diarrhea. Depending on your individual set of symptoms, you may also receive an intravenous injection of x-ray contrast/dye. Plan on being at [facility] for 45 minutes or longer, depending on the type of exam you are having performed.   If you have any questions regarding your exam or if you need to reschedule, you may call [facility] at [phone] between the hours of 8:00 am and 5:00 pm, Monday-Friday.   The [facility] providers would like to encourage you to use [facility] to communicate with providers for non-urgent requests or questions.  Due to long hold times on the telephone, sending your provider a message by [facility] may be a faster and more efficient way to get a response.  Please allow 48 business hours for a response.  Please remember that this is for non-urgent requests.   It was a pleasure to see you today!  Thank you for trusting me with your gastrointestinal care!

## 2022-03-02 ENCOUNTER — Ambulatory Visit (HOSPITAL_COMMUNITY)
Admission: RE | Admit: 2022-03-02 | Discharge: 2022-03-02 | Disposition: A | Discharge status: Home / Self Care | Payer: Medicare Other | Source: Ambulatory Visit | Attending: Nurse Practitioner | Admitting: Nurse Practitioner

## 2022-03-02 DIAGNOSIS — R1012 Left upper quadrant pain: Secondary | ICD-10-CM | POA: Insufficient documentation

## 2022-03-02 DIAGNOSIS — D649 Anemia, unspecified: Secondary | ICD-10-CM | POA: Diagnosis not present

## 2022-03-02 DIAGNOSIS — R1032 Left lower quadrant pain: Secondary | ICD-10-CM | POA: Diagnosis not present

## 2022-03-02 MED ORDER — IOHEXOL 300 MG/ML  SOLN
100.0000 mL | Freq: Once | INTRAMUSCULAR | Status: AC | PRN
  Administered 2022-03-02: 100 mL via INTRAVENOUS

## 2022-03-02 MED ORDER — SODIUM CHLORIDE (PF) 0.9 % IJ SOLN
INTRAMUSCULAR | Status: AC
  Filled 2022-03-02: qty 50

## 2022-03-02 NOTE — Progress Notes (Signed)
Agree with assessment and plan as outlined.  

## 2022-03-13 ENCOUNTER — Other Ambulatory Visit: Payer: Self-pay

## 2022-03-13 DIAGNOSIS — R1032 Left lower quadrant pain: Secondary | ICD-10-CM

## 2022-03-13 DIAGNOSIS — D649 Anemia, unspecified: Secondary | ICD-10-CM

## 2022-03-13 DIAGNOSIS — R1012 Left upper quadrant pain: Secondary | ICD-10-CM

## 2022-03-27 ENCOUNTER — Encounter: Payer: Self-pay | Admitting: Certified Registered Nurse Anesthetist

## 2022-03-29 ENCOUNTER — Telehealth: Payer: Self-pay | Admitting: Family

## 2022-03-29 DIAGNOSIS — D649 Anemia, unspecified: Secondary | ICD-10-CM

## 2022-03-29 DIAGNOSIS — D509 Iron deficiency anemia, unspecified: Secondary | ICD-10-CM

## 2022-03-29 NOTE — Telephone Encounter (Signed)
Please schedule patient a lab appointment to further evaluate her anemia.

## 2022-03-30 ENCOUNTER — Encounter: Payer: Self-pay | Admitting: Gastroenterology

## 2022-03-30 ENCOUNTER — Ambulatory Visit (AMBULATORY_SURGERY_CENTER): Payer: Medicare Other | Admitting: Gastroenterology

## 2022-03-30 ENCOUNTER — Other Ambulatory Visit: Payer: Self-pay | Admitting: Family

## 2022-03-30 ENCOUNTER — Other Ambulatory Visit: Payer: Self-pay | Admitting: Endocrinology

## 2022-03-30 VITALS — BP 133/73 | HR 86 | Temp 98.6°F | Resp 15 | Ht 64.0 in | Wt 232.0 lb

## 2022-03-30 DIAGNOSIS — E0590 Thyrotoxicosis, unspecified without thyrotoxic crisis or storm: Secondary | ICD-10-CM

## 2022-03-30 DIAGNOSIS — K449 Diaphragmatic hernia without obstruction or gangrene: Secondary | ICD-10-CM | POA: Diagnosis not present

## 2022-03-30 DIAGNOSIS — R1310 Dysphagia, unspecified: Secondary | ICD-10-CM

## 2022-03-30 DIAGNOSIS — I10 Essential (primary) hypertension: Secondary | ICD-10-CM | POA: Diagnosis not present

## 2022-03-30 DIAGNOSIS — K3189 Other diseases of stomach and duodenum: Secondary | ICD-10-CM

## 2022-03-30 DIAGNOSIS — R933 Abnormal findings on diagnostic imaging of other parts of digestive tract: Secondary | ICD-10-CM

## 2022-03-30 DIAGNOSIS — K319 Disease of stomach and duodenum, unspecified: Secondary | ICD-10-CM | POA: Diagnosis not present

## 2022-03-30 DIAGNOSIS — K229 Disease of esophagus, unspecified: Secondary | ICD-10-CM | POA: Diagnosis not present

## 2022-03-30 MED ORDER — SODIUM CHLORIDE 0.9 % IV SOLN: 500.0000 mL | Freq: Once | INTRAVENOUS | Status: DC

## 2022-03-30 NOTE — Patient Instructions (Signed)
Discharge instructions given. Handouts on Hiatal Hernia and Dilatation diet. Resume previous medications. YOU HAD AN ENDOSCOPIC PROCEDURE TODAY AT [facility]:   Refer to the procedure report that was given to you for any specific questions about what was found during the examination.  If the procedure report does not answer your questions, please call your gastroenterologist to clarify.  If you requested that your care partner not be given the details of your procedure findings, then the procedure report has been included in a sealed envelope for you to review at your convenience later.  YOU SHOULD EXPECT: Some feelings of bloating in the abdomen. Passage of more gas than usual.  Walking can help get rid of the air that was put into your GI tract during the procedure and reduce the bloating. If you had a lower endoscopy (such as a colonoscopy or flexible sigmoidoscopy) you may notice spotting of blood in your stool or on the toilet paper. If you underwent a bowel prep for your procedure, you may not have a normal bowel movement for a few days.  Please Note:  You might notice some irritation and congestion in your nose or some drainage.  This is from the oxygen used during your procedure.  There is no need for concern and it should clear up in a day or so.  SYMPTOMS TO REPORT IMMEDIATELY:  Following upper endoscopy (EGD)  Vomiting of blood or coffee ground material  New chest pain or pain under the shoulder blades  Painful or persistently difficult swallowing  New shortness of breath  Fever of 100F or higher  Black, tarry-looking stools  For urgent or emergent issues, a gastroenterologist can be reached at any hour by calling [phone]. Do not use MyChart messaging for urgent concerns.    DIET:  We do recommend a small meal at first, but then you may proceed to your regular diet.  Drink plenty of fluids but you should avoid alcoholic beverages for 24 hours.  ACTIVITY:   You should plan to take it easy for the rest of today and you should NOT DRIVE or use heavy machinery until tomorrow (because of the sedation medicines used during the test).    FOLLOW UP: Our staff will call the number listed on your records the next business day following your procedure.  We will call around 7:15- 8:00 am to check on you and address any questions or concerns that you may have regarding the information given to you following your procedure. If we do not reach you, we will leave a message.     If any biopsies were taken you will be contacted by phone or by letter within the next 1-3 weeks.  Please call us at [phone] if you have not heard about the biopsies in 3 weeks.    SIGNATURES/CONFIDENTIALITY: You and/or your care partner have signed paperwork which will be entered into your electronic medical record.  These signatures attest to the fact that that the information above on your After Visit Summary has been reviewed and is understood.  Full responsibility of the confidentiality of this discharge information lies with you and/or your care-partner.

## 2022-03-30 NOTE — Progress Notes (Signed)
A/ox3, pleased with MAC, report to RN 

## 2022-03-30 NOTE — Telephone Encounter (Signed)
Patient scheduled.

## 2022-03-30 NOTE — Op Note (Signed)
[facility] Patient Name: [name] Procedure Date: 03/30/2022 2:51 PM MRN: [id number] Endoscopist: Remo Lipps P. 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 EGD to further                            evaluate and dilate to see if can benefit dysphagia. Medicines:                Monitored Anesthesia Care Procedure:                Pre-Anesthesia Assessment:                           - Prior to the procedure, a History and Physical                            was performed, and patient medications and                            allergies were reviewed. The patient's tolerance of                            previous anesthesia was also reviewed. The risks                            and benefits of the procedure and the sedation                            options and risks were discussed with the patient.                            All questions were answered, and informed consent                            was obtained. Prior Anticoagulants: The patient has                            taken no previous anticoagulant or antiplatelet                            agents. ASA Grade Assessment: II - A patient with  mild systemic disease. After reviewing the risks                            and benefits, the patient was deemed in                            satisfactory condition to undergo the procedure.                           After obtaining informed consent, the endoscope was                            passed under direct vision. Throughout the                            procedure,  the patient's blood pressure, pulse, and                            oxygen saturations were monitored continuously. The                            GIF [id number] #[id number] was introduced through the                            mouth, and advanced to the second part of duodenum.                            The upper GI endoscopy was accomplished without                            difficulty. The patient tolerated the procedure                            well. Scope In: Scope Out: Findings:                 Esophagogastric landmarks were identified: the                            Z-line was found at 36 cm, the gastroesophageal                            junction was found at 36 cm and the upper extent of                            the gastric folds was found at 37 cm from the                            incisors.                           A 1 cm hiatal hernia was present.                           A few benign appearing  small blebs were found in                            the lower third of the esophagus.                           The exam of the esophagus was otherwise normal. No                            erosive changes, no Barrett's, no overt changes                            concerning for EoE.                           A guidewire was placed and the scope was withdrawn.                            Empiric dilation was performed in the entire                            esophagus with a Savary dilator with mild                            resistance at 17 mm. Relook endoscopy showed a                            small mucosal wrent just inferior to the UES, site                            of suspected subtle stenosis. Biopsies were taken                            with a cold forceps in the middle third of the                            esophagus and in the lower third of the esophagus                            for histology to ensure no EoE.                           The entire examined stomach  was normal.                           Mucosal changes were found in the entire duodenum                            with multiple small blebs that appeared fluid                            filled - nonspecific. Biopsies were taken with a  cold forceps for histology.                           The exam of the duodenum was otherwise normal. Complications:            No immediate complications. Estimated blood loss:                            Minimal. Estimated Blood Loss:     Estimated blood loss was minimal. Impression:               - Esophagogastric landmarks identified.                           - 1 cm hiatal hernia.                           - Benign appearing blebs found in the lower                            esophagus.                           - Dilation of the esophagus performed to 70m with                            Savary, mucosal wrent appreciated just inferior to                            the UES                           - Biopsies of the esophagus obtained                           - Normal stomach.                           - Mucosal changes in the duodenum as outlined.                            Biopsied. Recommendation:           - Patient has a contact number available for                            emergencies. The signs and symptoms of potential                            delayed complications were discussed with the                            patient. Return to normal activities tomorrow.                            Written discharge instructions were provided to the  patient.                           - Post dilation                           - Continue present medications.                           - Await pathology results and course post dilation Denise Gulley P. Shyvonne Chastang, MD 03/30/2022 3:21:32 PM This report has been signed electronically.

## 2022-03-30 NOTE — Progress Notes (Signed)
[facility] History and Physical   Primary Care Physician:  [name], NP   Reason for Procedure:   Abnormal CT scan - esophageal thickening, dysphagia  Plan:    EGD with possible dilation     HPI: [name] is a 61 y.o. female  here for EGD To evaluate issues as above. Recall she has a history of jackhammer esophagus. She has had an EGD with dilation of her esophagus in the past, associated with a coronary vasospastic event post procedure that led to hospitalization. Now followed by cardiology. Patient continues to have dysphagia, recent CT abdomen done to evaluate abdominal pain and esophageal thickening noted. EGD to further evaluate. I have discussed if she wanted a dilation again given what happened following her last endoscopy. She did want to have a dilation done to see if we can help her swallowing, pending findings. She is otherwise feeling well today, no cardiopulmonary symptoms   I have discussed risks / benefits of anesthesia and endoscopic procedure with [name] and they wish to proceed with the exams as outlined today.    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/08- benign   Iron deficiency anemia    Migraine headache    Non-compliance    Peptic ulcer disease    Thyroid disease     Past Surgical History:  Procedure Laterality Date   ABDOMINAL EXPLORATION SURGERY     w/bso    BREAST BIOPSY     CARDIAC CATHETERIZATION  2009   mild non obstructive CAD   CHOLECYSTECTOMY      COLONOSCOPY     ESOPHAGEAL MANOMETRY N/A 08/01/2020   Procedure: ESOPHAGEAL MANOMETRY (EM);  Surgeon: Yetta Flock, MD;  Location: [facility];  Service: Gastroenterology;  Laterality: N/A;   KNEE SURGERY  2005    left knee   LEFT HEART CATH AND CORONARY ANGIOGRAPHY N/A 07/17/2017   Procedure: LEFT HEART CATH AND CORONARY ANGIOGRAPHY;  Surgeon: Martinique, Peter M, MD;  Location: [facility];  Service: Cardiovascular;  Laterality: N/A;   LEFT HEART CATH AND CORONARY ANGIOGRAPHY N/A 09/01/2021   Procedure: LEFT HEART CATH AND CORONARY ANGIOGRAPHY;  Surgeon: Burnell Blanks, MD;  Location: [facility];  Service: Cardiovascular;  Laterality: N/A;   POLYPECTOMY     TOTAL ABDOMINAL HYSTERECTOMY     UPPER GASTROINTESTINAL ENDOSCOPY      Prior to Admission medications   Medication Sig Start Date End Date Taking? Authorizing Provider  amLODipine (NORVASC) 10 MG tablet Take 1 tablet by mouth once daily 03/30/22  Yes [name], NP  isosorbide mononitrate (IMDUR) 60 MG 24 hr tablet Take 1.5 tablets (90 mg total) by mouth daily. 09/04/21  Yes Fay Records, MD  methimazole (TAPAZOLE) 5 MG tablet Take 1 tablet by mouth once daily 12/21/21  Yes Elayne Snare, MD  potassium chloride SA (KLOR-CON M20) 20 MEQ tablet Take 1 tablet (20 mEq  total) by mouth daily. 10/02/21  Yes Fay Records, MD  rosuvastatin (CRESTOR) 40 MG tablet Take 1 tablet (40 mg total) by mouth daily. 12/22/21  Yes [name], NP  albuterol (VENTOLIN HFA) 108 (90 Base) MCG/ACT inhaler INHALE 2 PUFFS BY MOUTH 4 TIMES DAILY Patient taking differently: Inhale 2 puffs into the lungs every 6 (six) hours as needed for wheezing or shortness of breath. 08/18/21   [name], NP  alclomethasone (ACLOVATE) 0.05 % cream Apply topically 2 (two) times daily as needed (Rash). 01/26/21   Warren Danes, PA-C  aspirin 81 MG chewable tablet Chew 1 tablet (81 mg total) by mouth daily. Patient not taking:  Reported on 03/30/2022 07/19/17   Samuella Cota, MD  Cholecalciferol (VITAMIN D3) 75 MCG (3000 UT) TABS Take 1 tablet by mouth daily. Patient not taking: Reported on 03/30/2022 12/01/21   [name], NP  clobetasol (TEMOVATE) 0.05 % external solution Apply 1 application topically 2 (two) times daily. Patient not taking: Reported on 03/30/2022 01/26/21   Warren Danes, PA-C  clotrimazole-betamethasone (LOTRISONE) cream Apply 1 application topically 2 (two) times daily. Patient not taking: Reported on 03/30/2022 05/01/21   Shelda Pal, DO  Evolocumab (REPATHA SURECLICK) 154 MG/ML SOAJ Inject 140 mg into the skin every 14 (fourteen) days. 08/15/21   Fay Records, MD  fluticasone [facility]) 50 MCG/ACT nasal spray Place 2 sprays into both nostrils daily. Patient not taking: Reported on 03/30/2022 01/17/21   Saguier, Percell Miller, PA-C  hyoscyamine (LEVSIN) 0.125 MG tablet Take 1 tablet (0.125 mg total) by mouth every 4 (four) hours as needed for cramping (diarrhea,nausea). Patient not taking: Reported on 03/30/2022 10/13/21   Yetta Flock, MD  imipramine (TOFRANIL) 25 MG tablet Take 2 tablets (50 mg total) by mouth at bedtime. Patient not taking: Reported on 03/30/2022 10/30/21   Yetta Flock, MD  loratadine (CLARITIN) 10 MG tablet Take 1 tablet (10 mg total) by mouth daily. Patient not taking: Reported on 03/30/2022 07/15/20   Carlisle Cater, PA-C  methocarbamol (ROBAXIN) 500 MG tablet Take 1 tablet (500 mg total) by mouth every 8 (eight) hours as needed for muscle spasms. Patient not taking: Reported on 03/30/2022 09/15/21   [name], NP  metoCLOPramide (REGLAN) 5 MG tablet TAKE 1 TABLET BY MOUTH THREE TIMES DAILY BEFORE MEAL(S) Patient not taking: Reported on 03/30/2022 03/30/22   [name], NP  metoprolol succinate (TOPROL-XL) 100 MG 24 hr tablet Take 1 tablet (100 mg total) by mouth daily. Take with or immediately following a meal. Patient not taking:  Reported on 03/30/2022 01/12/22   [name], NP  nitroGLYCERIN (NITROSTAT) 0.4 MG SL tablet Place 1 tablet (0.4 mg total) under the tongue every 5 (five) minutes as needed for chest pain. Patient not taking: Reported on 03/30/2022 05/27/20   Fay Records, MD  nystatin (MYCOSTATIN/NYSTOP) powder Apply 1 application topically 2 (two) times daily. Beneath both breasts Patient not taking: Reported on 03/30/2022 06/10/20   [name], NP  ondansetron (ZOFRAN) 4 MG tablet Take 1 tab by mouth every 6 hours as needed for nausea. Patient not taking: Reported on 03/30/2022 06/10/20   [name], NP  SUMAtriptan (IMITREX) 50 MG tablet Take 1 tablet (50 mg total) by mouth every 2 (two) hours as needed for migraine. May repeat in 2 hours if headache persists or recurs. Patient not taking: Reported on 03/30/2022 12/01/21   [name], NP  tretinoin (RETIN-A) 0.1 % cream Apply topically at bedtime. Patient  not taking: Reported on 03/30/2022 06/01/21 06/01/22  Warren Danes, PA-C    Current Outpatient Medications  Medication Sig Dispense Refill   amLODipine (NORVASC) 10 MG tablet Take 1 tablet by mouth once daily 90 tablet 0   isosorbide mononitrate (IMDUR) 60 MG 24 hr tablet Take 1.5 tablets (90 mg total) by mouth daily. 135 tablet 3   methimazole (TAPAZOLE) 5 MG tablet Take 1 tablet by mouth once daily 30 tablet 0   potassium chloride SA (KLOR-CON M20) 20 MEQ tablet Take 1 tablet (20 mEq total) by mouth daily. 30 tablet 11   rosuvastatin (CRESTOR) 40 MG tablet Take 1 tablet (40 mg total) by mouth daily. 90 tablet 3   albuterol (VENTOLIN HFA) 108 (90 Base) MCG/ACT inhaler INHALE 2 PUFFS BY MOUTH 4 TIMES DAILY (Patient taking differently: Inhale 2 puffs into the lungs every 6 (six) hours as needed for wheezing or shortness of breath.) 9 g 0   alclomethasone (ACLOVATE) 0.05 % cream Apply topically 2 (two) times daily as needed (Rash). 180 g 3   aspirin 81 MG chewable tablet Chew 1  tablet (81 mg total) by mouth daily. (Patient not taking: Reported on 03/30/2022)     Cholecalciferol (VITAMIN D3) 75 MCG (3000 UT) TABS Take 1 tablet by mouth daily. (Patient not taking: Reported on 03/30/2022) 30 tablet    clobetasol (TEMOVATE) 0.05 % external solution Apply 1 application topically 2 (two) times daily. (Patient not taking: Reported on 03/30/2022) 50 mL 6   clotrimazole-betamethasone (LOTRISONE) cream Apply 1 application topically 2 (two) times daily. (Patient not taking: Reported on 03/30/2022) 30 g 0   Evolocumab (REPATHA SURECLICK) 194 MG/ML SOAJ Inject 140 mg into the skin every 14 (fourteen) days. 2 mL 11   fluticasone (FLONASE) 50 MCG/ACT nasal spray Place 2 sprays into both nostrils daily. (Patient not taking: Reported on 03/30/2022) 16 g 1   hyoscyamine (LEVSIN) 0.125 MG tablet Take 1 tablet (0.125 mg total) by mouth every 4 (four) hours as needed for cramping (diarrhea,nausea). (Patient not taking: Reported on 03/30/2022) 90 tablet 1   imipramine (TOFRANIL) 25 MG tablet Take 2 tablets (50 mg total) by mouth at bedtime. (Patient not taking: Reported on 03/30/2022) 60 tablet 1   loratadine (CLARITIN) 10 MG tablet Take 1 tablet (10 mg total) by mouth daily. (Patient not taking: Reported on 03/30/2022) 10 tablet 0   methocarbamol (ROBAXIN) 500 MG tablet Take 1 tablet (500 mg total) by mouth every 8 (eight) hours as needed for muscle spasms. (Patient not taking: Reported on 03/30/2022) 30 tablet 0   metoCLOPramide (REGLAN) 5 MG tablet TAKE 1 TABLET BY MOUTH THREE TIMES DAILY BEFORE MEAL(S) (Patient not taking: Reported on 03/30/2022) 90 tablet 0   metoprolol succinate (TOPROL-XL) 100 MG 24 hr tablet Take 1 tablet (100 mg total) by mouth daily. Take with or immediately following a meal. (Patient not taking: Reported on 03/30/2022) 90 tablet 1   nitroGLYCERIN (NITROSTAT) 0.4 MG SL tablet Place 1 tablet (0.4 mg total) under the tongue every 5 (five) minutes as needed for chest pain. (Patient not  taking: Reported on 03/30/2022) 25 tablet 3   nystatin (MYCOSTATIN/NYSTOP) powder Apply 1 application topically 2 (two) times daily. Beneath both breasts (Patient not taking: Reported on 03/30/2022) 60 g 1   ondansetron (ZOFRAN) 4 MG tablet Take 1 tab by mouth every 6 hours as needed for nausea. (Patient not taking: Reported on 03/30/2022) 100 tablet 1   SUMAtriptan (IMITREX) 50 MG tablet Take 1 tablet (  50 mg total) by mouth every 2 (two) hours as needed for migraine. May repeat in 2 hours if headache persists or recurs. (Patient not taking: Reported on 03/30/2022) 10 tablet 5   tretinoin (RETIN-A) 0.1 % cream Apply topically at bedtime. (Patient not taking: Reported on 03/30/2022) 45 g 6   Current Facility-Administered Medications  Medication Dose Route Frequency Provider Last Rate Last Admin   0.9 %  sodium chloride infusion  500 mL Intravenous Once Yetta Flock, MD        Allergies as of 03/30/2022 - Review Complete 03/30/2022  Allergen Reaction Noted   Ace inhibitors Cough 09/19/2009   Celebrex [celecoxib] Other (See Comments) 05/16/2011   Diovan [valsartan]  01/09/2013    Family History  Problem Relation Age of Onset   Hypertension Mother    Rheum arthritis Mother    Hypertension Father    Diabetes Father    Prostate cancer Father    Kidney disease Father    Diabetes Sister    Fibromyalgia Sister    Diabetes Maternal Grandmother    Lung cancer Maternal Grandfather    Arthritis Brother    Migraines Brother    CAD Brother    Fibromyalgia Sister    Migraines Sister    Colon cancer Neg Hx    Thyroid disease Neg Hx    Colon polyps Neg Hx     Social History   Socioeconomic History   Marital status: Widowed    Spouse name: Not on file   Number of children: 2   Years of education: Not on file   Highest education level: Not on file  Occupational History   Occupation: DISABILITY    Employer: UNEMPLOYED  Tobacco Use   Smoking status: Former    Packs/day: 0.50     Years: 18.00    Total pack years: 9.00    Types: Cigarettes    Start date: 07/29/1977    Quit date: 06/25/1994    Years since quitting: 27.7   Smokeless tobacco: Never   Tobacco comments:    quit 19 years ago  Vaping Use   Vaping Use: Never used  Substance and Sexual Activity   Alcohol use: Yes    Alcohol/week: 0.0 standard drinks of alcohol    Comment: social drinker   Drug use: No   Sexual activity: Not Currently  Other Topics Concern   Not on file  Social History Narrative   Widowed   Has 2 grown children.  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    Former Smoker Quit tobacco in 1996.  She was a pack a day smoker for approximately 10 years.    Alcohol use-yes: Social    Daily Caffeine Use:6 pack of pepsi daily     Illicit Drug Use - no    Patient does not get regular exercise.       Smoking Status:  quit   Social Determinants of Health   Financial Resource Strain: Low Risk  (06/02/2021)   Overall Financial Resource Strain (CARDIA)    Difficulty of Paying Living Expenses: Not hard at all  Food Insecurity: No Food Insecurity (06/02/2021)   Hunger Vital Sign    Worried About Running Out of Food in the Last Year: Never true    Ran Out of Food in the Last Year: Never true  Transportation Needs: No Transportation Needs (06/02/2021)   PRAPARE - Hydrologist (Medical): No  Lack of Transportation (Non-Medical): No  Physical Activity: Insufficiently Active (06/02/2021)   Exercise Vital Sign    Days of Exercise per Week: 2 days    Minutes of Exercise per Session: 30 min  Stress: No Stress Concern Present (06/02/2021)   [place]    Feeling of Stress : Not at all  Social Connections: Moderately Isolated (06/02/2021)   Social Connection and Isolation Panel [NHANES]    Frequency of Communication with Friends and Family: More than three times a week     Frequency of Social Gatherings with Friends and Family: More than three times a week    Attends Religious Services: More than 4 times per year    Active Member of [employer] or Organizations: No    Attends Archivist Meetings: Never    Marital Status: Widowed  Intimate Partner Violence: Not At Risk (06/02/2021)   Humiliation, Afraid, Rape, and Kick questionnaire    Fear of Current or Ex-Partner: No    Emotionally Abused: No    Physically Abused: No    Sexually Abused: No    Review of Systems: All other review of systems negative except as mentioned in the HPI.  Physical Exam: Vital signs BP (!) 148/75   Pulse 71   Temp 98.6 F (37 C) (Skin)   Ht '5\' 4"'$  (1.626 m)   Wt 232 lb (105.2 kg)   SpO2 100%   BMI 39.82 kg/m   General:   Alert,  Well-developed, pleasant and cooperative in NAD Lungs:  Clear throughout to auscultation.   Heart:  Regular rate and rhythm Abdomen:  Soft, nontender and nondistended.   Neuro/Psych:  Alert and cooperative. Normal mood and affect. A and O x 3  Jolly Mango, MD [facility]

## 2022-03-30 NOTE — Progress Notes (Signed)
Pt's states no medical or surgical changes since previsit or office visit. VS assessed by AS 

## 2022-04-02 ENCOUNTER — Telehealth: Payer: Self-pay

## 2022-04-02 NOTE — Telephone Encounter (Signed)
  Follow up Call-     03/30/2022    2:32 PM  Call back number  Post procedure Call Back phone  # [phone]  Permission to leave phone message Yes     Patient questions:  Do you have a fever, pain , or abdominal swelling? No. Pain Score  0 *  Have you tolerated food without any problems? Yes.    Have you been able to return to your normal activities? Yes.    Do you have any questions about your discharge instructions: Diet   No. Medications  No. Follow up visit  No.  Do you have questions or concerns about your Care? No.  Actions: * If pain score is 4 or above: No action needed, pain <4.

## 2022-04-06 ENCOUNTER — Encounter: Payer: Self-pay | Admitting: Gastroenterology

## 2022-04-06 ENCOUNTER — Other Ambulatory Visit: Payer: BC Managed Care – PPO

## 2022-04-06 DIAGNOSIS — D509 Iron deficiency anemia, unspecified: Secondary | ICD-10-CM | POA: Diagnosis not present

## 2022-04-09 ENCOUNTER — Other Ambulatory Visit: Payer: Self-pay | Admitting: Endocrinology

## 2022-04-09 DIAGNOSIS — E0590 Thyrotoxicosis, unspecified without thyrotoxic crisis or storm: Secondary | ICD-10-CM

## 2022-04-10 ENCOUNTER — Encounter: Payer: Self-pay | Admitting: Family

## 2022-04-10 LAB — HGB FRACTIONATION CASCADE
Hgb A2: 2.3 % (ref 1.8–3.2)
Hgb A: 97.7 % (ref 96.4–98.8)
Hgb F: 0 % (ref 0.0–2.0)
Hgb S: 0 %

## 2022-04-12 ENCOUNTER — Other Ambulatory Visit: Payer: Self-pay

## 2022-04-12 DIAGNOSIS — E0590 Thyrotoxicosis, unspecified without thyrotoxic crisis or storm: Secondary | ICD-10-CM

## 2022-04-12 MED ORDER — METHIMAZOLE 5 MG PO TABS: 5.0000 mg | ORAL_TABLET | Freq: Every day | ORAL | 0 refills | Status: DC

## 2022-04-12 NOTE — Progress Notes (Signed)
Mailed out to patient 

## 2022-05-01 ENCOUNTER — Other Ambulatory Visit: Payer: Self-pay | Admitting: Endocrinology

## 2022-05-01 DIAGNOSIS — E0590 Thyrotoxicosis, unspecified without thyrotoxic crisis or storm: Secondary | ICD-10-CM

## 2022-05-02 ENCOUNTER — Telehealth: Payer: Self-pay | Admitting: Pharmacist Clinician (PhC)/ Clinical Pharmacy Specialist

## 2022-05-02 ENCOUNTER — Other Ambulatory Visit: Payer: Self-pay | Admitting: Pharmacist Clinician (PhC)/ Clinical Pharmacy Specialist

## 2022-05-02 MED ORDER — REPATHA SURECLICK 140 MG/ML ~~LOC~~ SOAJ: SUBCUTANEOUS | 3 refills | Status: DC

## 2022-05-02 MED ORDER — REPATHA SURECLICK 140 MG/ML ~~LOC~~ SOAJ: 140.0000 mg | SUBCUTANEOUS | 3 refills | Status: DC

## 2022-05-02 NOTE — Telephone Encounter (Signed)
Repatha PA approved to 06/25/23  [place]

## 2022-05-05 ENCOUNTER — Other Ambulatory Visit: Payer: Self-pay | Admitting: Endocrinology

## 2022-05-05 DIAGNOSIS — E0590 Thyrotoxicosis, unspecified without thyrotoxic crisis or storm: Secondary | ICD-10-CM

## 2022-05-11 ENCOUNTER — Encounter: Payer: Self-pay | Admitting: Family

## 2022-05-11 ENCOUNTER — Ambulatory Visit (INDEPENDENT_AMBULATORY_CARE_PROVIDER_SITE_OTHER): Payer: Medicare Other | Admitting: Family

## 2022-05-11 VITALS — BP 127/77 | HR 59 | Temp 98.3°F | Resp 16 | Wt 230.0 lb

## 2022-05-11 DIAGNOSIS — M1990 Unspecified osteoarthritis, unspecified site: Secondary | ICD-10-CM | POA: Diagnosis not present

## 2022-05-11 NOTE — Assessment & Plan Note (Signed)
?   Osteoarthritis.  She does have a family hx of RA. Will check autoimmune testing as below.  Continue tylenol as needed for pain.

## 2022-05-11 NOTE — Progress Notes (Addendum)
Subjective:   By signing my name below, I, Shehryar Baig, attest that this documentation has been prepared under the direction and in the presence of [name], NP. 05/11/2022     Patient ID: [name], female    DOB: 16-Nov-1960, 61 y.o.   MRN: [id number]  Chief Complaint  Patient presents with   Joint Pain    Complains of joint pain "all over"    HPI Patient is in today for a office visit.   Joint pain: She complains of constant joint pain. She is taking tylenol arthritis to manage her pain and finds mild relief. Her grandmother and aunt has a history of rheumatoid arthritis.   Immunizations: She is interested in receiving the flu vaccine during this visit. She is interested in receiving the new Covid-19 booster vaccine at her pharmacy.    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/08- benign   Iron deficiency anemia    Migraine headache    Non-compliance    Peptic ulcer disease    Thyroid disease     Past Surgical History:  Procedure Laterality Date   ABDOMINAL EXPLORATION SURGERY     w/bso    BREAST BIOPSY     CARDIAC CATHETERIZATION  2009   mild non obstructive CAD   CHOLECYSTECTOMY     COLONOSCOPY     ESOPHAGEAL MANOMETRY N/A 08/01/2020   Procedure: ESOPHAGEAL MANOMETRY (EM);  Surgeon: Yetta Flock, MD;  Location: [facility];  Service: Gastroenterology;  Laterality: N/A;   KNEE SURGERY  2005    left knee   LEFT HEART CATH AND CORONARY ANGIOGRAPHY N/A 07/17/2017   Procedure: LEFT HEART CATH AND  CORONARY ANGIOGRAPHY;  Surgeon: Martinique, Peter M, MD;  Location: [facility];  Service: Cardiovascular;  Laterality: N/A;   LEFT HEART CATH AND CORONARY ANGIOGRAPHY N/A 09/01/2021   Procedure: LEFT HEART CATH AND CORONARY ANGIOGRAPHY;  Surgeon: Burnell Blanks, MD;  Location: [facility];  Service: Cardiovascular;  Laterality: N/A;   POLYPECTOMY     TOTAL ABDOMINAL HYSTERECTOMY     UPPER GASTROINTESTINAL ENDOSCOPY      Family History  Problem Relation Age of Onset   Hypertension Mother    Rheum arthritis Mother    Hypertension Father    Diabetes Father    Prostate cancer Father    Kidney disease Father    Diabetes Sister    Fibromyalgia Sister    Diabetes Maternal Grandmother    Lung cancer Maternal Grandfather    Arthritis Brother    Migraines Brother    CAD Brother    Fibromyalgia Sister    Migraines Sister    Colon cancer Neg Hx    Thyroid disease Neg Hx    Colon polyps Neg Hx     Social History   Socioeconomic History   Marital status: Widowed    Spouse name: Not on file   Number of  children: 2   Years of education: Not on file   Highest education level: Not on file  Occupational History   Occupation: DISABILITY    Employer: UNEMPLOYED  Tobacco Use   Smoking status: Former    Packs/day: 0.50    Years: 18.00    Total pack years: 9.00    Types: Cigarettes    Start date: 07/29/1977    Quit date: 06/25/1994    Years since quitting: 27.8   Smokeless tobacco: Never   Tobacco comments:    quit 19 years ago  Vaping Use   Vaping Use: Never used  Substance and Sexual Activity   Alcohol use: Yes    Alcohol/week: 0.0 standard drinks of alcohol    Comment: social drinker   Drug use: No   Sexual activity: Not Currently  Other Topics Concern   Not on file  Social History Narrative   Widowed   Has 2 grown children.  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    Former Smoker Quit tobacco in 1996.  She was a  pack a day smoker for approximately 10 years.    Alcohol use-yes: Social    Daily Caffeine Use:6 pack of pepsi daily     Illicit Drug Use - no    Patient does not get regular exercise.       Smoking Status:  quit   Social Determinants of Health   Financial Resource Strain: Low Risk  (06/02/2021)   Overall Financial Resource Strain (CARDIA)    Difficulty of Paying Living Expenses: Not hard at all  Food Insecurity: No Food Insecurity (06/02/2021)   Hunger Vital Sign    Worried About Running Out of Food in the Last Year: Never true    Ran Out of Food in the Last Year: Never true  Transportation Needs: No Transportation Needs (06/02/2021)   PRAPARE - Hydrologist (Medical): No    Lack of Transportation (Non-Medical): No  Physical Activity: Insufficiently Active (06/02/2021)   Exercise Vital Sign    Days of Exercise per Week: 2 days    Minutes of Exercise per Session: 30 min  Stress: No Stress Concern Present (06/02/2021)   [place]    Feeling of Stress : Not at all  Social Connections: Moderately Isolated (06/02/2021)   Social Connection and Isolation Panel [NHANES]    Frequency of Communication with Friends and Family: More than three times a week    Frequency of Social Gatherings with Friends and Family: More than three times a week    Attends Religious Services: More than 4 times per year    Active Member of [employer] or Organizations: No    Attends Archivist Meetings: Never    Marital Status: Widowed  Intimate Partner Violence: Not At Risk (06/02/2021)   Humiliation, Afraid, Rape, and Kick questionnaire    Fear of Current or Ex-Partner: No    Emotionally Abused: No    Physically Abused: No    Sexually Abused: No    Outpatient Medications Prior to Visit  Medication Sig Dispense Refill   albuterol (VENTOLIN HFA) 108 (90 Base) MCG/ACT inhaler INHALE 2 PUFFS BY MOUTH 4 TIMES DAILY  (Patient taking differently: Inhale 2 puffs into the lungs every 6 (six) hours as needed for wheezing or shortness of breath.) 9 g 0   alclomethasone (ACLOVATE) 0.05 % cream Apply topically 2 (two) times daily as  needed (Rash). 180 g 3   amLODipine (NORVASC) 10 MG tablet Take 1 tablet by mouth once daily 90 tablet 0   aspirin 81 MG chewable tablet Chew 1 tablet (81 mg total) by mouth daily.     Cholecalciferol (VITAMIN D3) 75 MCG (3000 UT) TABS Take 1 tablet by mouth daily. 30 tablet    clobetasol (TEMOVATE) 0.05 % external solution Apply 1 application topically 2 (two) times daily. 50 mL 6   clotrimazole-betamethasone (LOTRISONE) cream Apply 1 application topically 2 (two) times daily. 30 g 0   Evolocumab (REPATHA SURECLICK) 161 MG/ML SOAJ Inject 1 pen (140 mg) subcutaneously every 14 days 6 mL 3   fluticasone (FLONASE) 50 MCG/ACT nasal spray Place 2 sprays into both nostrils daily. 16 g 1   hyoscyamine (LEVSIN) 0.125 MG tablet Take 1 tablet (0.125 mg total) by mouth every 4 (four) hours as needed for cramping (diarrhea,nausea). 90 tablet 1   imipramine (TOFRANIL) 25 MG tablet Take 50 mg by mouth at bedtime. 60 tablet 1   isosorbide mononitrate (IMDUR) 60 MG 24 hr tablet Take 1.5 tablets (90 mg total) by mouth daily. 135 tablet 3   loratadine (CLARITIN) 10 MG tablet Take 1 tablet (10 mg total) by mouth daily. 10 tablet 0   methimazole (TAPAZOLE) 5 MG tablet Take 1 tablet (5 mg total) by mouth daily. 30 tablet 0   methocarbamol (ROBAXIN) 500 MG tablet Take 1 tablet (500 mg total) by mouth every 8 (eight) hours as needed for muscle spasms. 30 tablet 0   metoCLOPramide (REGLAN) 5 MG tablet TAKE 1 TABLET BY MOUTH THREE TIMES DAILY BEFORE MEAL(S) 90 tablet 0   metoprolol succinate (TOPROL-XL) 100 MG 24 hr tablet Take 1 tablet (100 mg total) by mouth daily. Take with or immediately following a meal. 90 tablet 1   nitroGLYCERIN (NITROSTAT) 0.4 MG SL tablet Place 1 tablet (0.4 mg total) under the tongue  every 5 (five) minutes as needed for chest pain. 25 tablet 3   nystatin (MYCOSTATIN/NYSTOP) powder Apply 1 application topically 2 (two) times daily. Beneath both breasts 60 g 1   ondansetron (ZOFRAN) 4 MG tablet Take 1 tab by mouth every 6 hours as needed for nausea. 100 tablet 1   potassium chloride SA (KLOR-CON M20) 20 MEQ tablet Take 1 tablet (20 mEq total) by mouth daily. 30 tablet 11   rosuvastatin (CRESTOR) 40 MG tablet Take 1 tablet (40 mg total) by mouth daily. 90 tablet 3   SUMAtriptan (IMITREX) 50 MG tablet Take 1 tablet (50 mg total) by mouth every 2 (two) hours as needed for migraine. May repeat in 2 hours if headache persists or recurs. 10 tablet 5   tretinoin (RETIN-A) 0.1 % cream Apply topically at bedtime. 45 g 6   No facility-administered medications prior to visit.    Allergies  Allergen Reactions   Ace Inhibitors Cough   Celebrex [Celecoxib] Other (See Comments)    "makes me bleed"   Diovan [Valsartan]     angioedema    Review of Systems  Musculoskeletal:  Positive for joint pain.       Objective:    Physical Exam Constitutional:      General: She is not in acute distress.    Appearance: Normal appearance. She is not ill-appearing.  HENT:     Head: Normocephalic and atraumatic.     Right Ear: External ear normal.     Left Ear: External ear normal.  Eyes:     Extraocular Movements:  Extraocular movements intact.     Pupils: Pupils are equal, round, and reactive to light.  Cardiovascular:     Rate and Rhythm: Normal rate and regular rhythm.     Heart sounds: Normal heart sounds. No murmur heard.    No gallop.  Pulmonary:     Effort: Pulmonary effort is normal. No respiratory distress.     Breath sounds: Normal breath sounds. No wheezing or rales.  Skin:    General: Skin is warm and dry.  Neurological:     Mental Status: She is alert and oriented to person, place, and time.  Psychiatric:        Judgment: Judgment normal.     BP 127/77 (BP  Location: Right Arm, Patient Position: Sitting, Cuff Size: Small)   Pulse (!) 59   Temp 98.3 F (36.8 C) (Oral)   Resp 16   Wt 230 lb (104.3 kg)   SpO2 99%   BMI 39.48 kg/m  Wt Readings from Last 3 Encounters:  05/11/22 230 lb (104.3 kg)  03/30/22 232 lb (105.2 kg)  03/01/22 232 lb (105.2 kg)       Assessment & Plan:   Problem List Items Addressed This Visit       Unprioritized   Arthritis - Primary    ? Osteoarthritis.  She does have a family hx of RA. Will check autoimmune testing as below.  Continue tylenol as needed for pain.       Relevant Orders   ANA   Rheumatoid Factor   Sedimentation rate     No orders of the defined types were placed in this encounter.   I, [name], NP, personally preformed the services described in this documentation.  All medical record entries made by the scribe were at my direction and in my presence.  I have reviewed the chart and discharge instructions (if applicable) and agree that the record reflects my personal performance and is accurate and complete. 05/11/2022   I,Shehryar Baig,acting as a Education administrator for [name], NP.,have documented all relevant documentation on the behalf of [name], NP,as directed by  [name], NP while in the presence of [name], NP.   [name], NP

## 2022-05-28 ENCOUNTER — Telehealth: Payer: Self-pay

## 2022-05-28 DIAGNOSIS — E0590 Thyrotoxicosis, unspecified without thyrotoxic crisis or storm: Secondary | ICD-10-CM

## 2022-05-28 NOTE — Telephone Encounter (Signed)
Please contact patient to schedule appointment.

## 2022-05-31 MED ORDER — METHIMAZOLE 5 MG PO TABS: 5.0000 mg | ORAL_TABLET | Freq: Every day | ORAL | 0 refills | Status: DC

## 2022-05-31 NOTE — Addendum Note (Signed)
Addended by: Jefferson Fuel on: 05/31/2022 07:26 AM   Modules accepted: Orders

## 2022-05-31 NOTE — Telephone Encounter (Signed)
done

## 2022-06-08 ENCOUNTER — Telehealth: Payer: Self-pay | Admitting: Family

## 2022-06-08 ENCOUNTER — Ambulatory Visit (INDEPENDENT_AMBULATORY_CARE_PROVIDER_SITE_OTHER): Payer: Medicare Other | Admitting: *Deleted

## 2022-06-08 DIAGNOSIS — Z0000 Encounter for general adult medical examination without abnormal findings: Secondary | ICD-10-CM | POA: Diagnosis not present

## 2022-06-08 MED ORDER — AMOXICILLIN 500 MG PO CAPS: 500.0000 mg | ORAL_CAPSULE | Freq: Three times a day (TID) | ORAL | 0 refills | Status: AC

## 2022-06-08 NOTE — Telephone Encounter (Signed)
Pt called stating that she is having issues with her ear and was wondering if Melissa could call something in. Please Advise.

## 2022-06-08 NOTE — Telephone Encounter (Signed)
Pt c/o R ear pain that started last night. She says she has not been to sleep yet because it has kept her up all night. She declines any other sxs. No opening in the office. Please advise.

## 2022-06-08 NOTE — Patient Instructions (Signed)
[name] , Thank you for taking time to come for your Medicare Wellness Visit. I appreciate your ongoing commitment to your health goals. Please review the following plan we discussed and let me know if I can assist you in the future.   These are the goals we discussed:  Goals      Increase physical activity     Reduce salt intake to 2 grams per day or less     Reduce sugar intake        This is a list of the screening recommended for you and due dates:  Health Maintenance  Topic Date Due   Eye exam for diabetics  Never done   Complete foot exam   05/30/2013   DTaP/Tdap/Td vaccine (3 - Td or Tdap) 04/29/2021   COVID-19 Vaccine (5 - 2023-24 season) 02/23/2022   Flu Shot  09/23/2022*   Hemoglobin A1C  08/06/2022   Mammogram  09/29/2022   Yearly kidney health urinalysis for diabetes  12/02/2022   Yearly kidney function blood test for diabetes  03/02/2023   Medicare Annual Wellness Visit  06/09/2023   Colon Cancer Screening  07/17/2027   Hepatitis C Screening: USPSTF Recommendation to screen - Ages 18-79 yo.  Completed   HIV Screening  Completed   Zoster (Shingles) Vaccine  Completed   HPV Vaccine  Aged Out  *Topic was postponed. The date shown is not the original due date.      Next appointment: Follow up in one year for your annual wellness visit.   Preventive Care 40-64 Years, Female Preventive care refers to lifestyle choices and visits with your health care provider that can promote health and wellness. What does preventive care include? A yearly physical exam. This is also called an annual well check. Dental exams once or twice a year. Routine eye exams. Ask your health care provider how often you should have your eyes checked. Personal lifestyle choices, including: Daily care of your teeth and gums. Regular physical activity. Eating a healthy diet. Avoiding tobacco and drug use. Limiting alcohol use. Practicing safe sex. Taking low-dose aspirin daily starting  at age 53. Taking vitamin and mineral supplements as recommended by your health care provider. What happens during an annual well check? The services and screenings done by your health care provider during your annual well check will depend on your age, overall health, lifestyle risk factors, and family history of disease. Counseling  Your health care provider may ask you questions about your: Alcohol use. Tobacco use. Drug use. Emotional well-being. Home and relationship well-being. Sexual activity. Eating habits. Work and work Statistician. Method of birth control. Menstrual cycle. Pregnancy history. Screening  You may have the following tests or measurements: Height, weight, and BMI. Blood pressure. Lipid and cholesterol levels. These may be checked every 5 years, or more frequently if you are over 21 years old. Skin check. Lung cancer screening. You may have this screening every year starting at age 71 if you have a 30-pack-year history of smoking and currently smoke or have quit within the past 15 years. Fecal occult blood test (FOBT) of the stool. You may have this test every year starting at age 85. Flexible sigmoidoscopy or colonoscopy. You may have a sigmoidoscopy every 5 years or a colonoscopy every 10 years starting at age 57. Hepatitis C blood test. Hepatitis B blood test. Sexually transmitted disease (STD) testing. Diabetes screening. This is done by checking your blood sugar (glucose) after you have not eaten for a  while (fasting). You may have this done every 1-3 years. Mammogram. This may be done every 1-2 years. Talk to your health care provider about when you should start having regular mammograms. This may depend on whether you have a family history of breast cancer. BRCA-related cancer screening. This may be done if you have a family history of breast, ovarian, tubal, or peritoneal cancers. Pelvic exam and Pap test. This may be done every 3 years starting at age 38.  Starting at age 46, this may be done every 5 years if you have a Pap test in combination with an HPV test. Bone density scan. This is done to screen for osteoporosis. You may have this scan if you are at high risk for osteoporosis. Discuss your test results, treatment options, and if necessary, the need for more tests with your health care provider. Vaccines  Your health care provider may recommend certain vaccines, such as: Influenza vaccine. This is recommended every year. Tetanus, diphtheria, and acellular pertussis (Tdap, Td) vaccine. You may need a Td booster every 10 years. Zoster vaccine. You may need this after age 6. Pneumococcal 13-valent conjugate (PCV13) vaccine. You may need this if you have certain conditions and were not previously vaccinated. Pneumococcal polysaccharide (PPSV23) vaccine. You may need one or two doses if you smoke cigarettes or if you have certain conditions. Talk to your health care provider about which screenings and vaccines you need and how often you need them. This information is not intended to replace advice given to you by your health care provider. Make sure you discuss any questions you have with your health care provider. Document Released: 07/08/2015 Document Revised: 02/29/2016 Document Reviewed: 04/12/2015 Elsevier Interactive Patient Education  2017 [employer] Prevention in the Home Falls can cause injuries. They can happen to people of all ages. There are many things you can do to make your home safe and to help prevent falls. What can I do on the outside of my home? Regularly fix the edges of walkways and driveways and fix any cracks. Remove anything that might make you trip as you walk through a door, such as a raised step or threshold. Trim any bushes or trees on the path to your home. Use bright outdoor lighting. Clear any walking paths of anything that might make someone trip, such as rocks or tools. Regularly check to see if  handrails are loose or broken. Make sure that both sides of any steps have handrails. Any raised decks and porches should have guardrails on the edges. Have any leaves, snow, or ice cleared regularly. Use sand or salt on walking paths during winter. Clean up any spills in your garage right away. This includes oil or grease spills. What can I do in the bathroom? Use night lights. Install grab bars by the toilet and in the tub and shower. Do not use towel bars as grab bars. Use non-skid mats or decals in the tub or shower. If you need to sit down in the shower, use a plastic, non-slip stool. Keep the floor dry. Clean up any water that spills on the floor as soon as it happens. Remove soap buildup in the tub or shower regularly. Attach bath mats securely with double-sided non-slip rug tape. Do not have throw rugs and other things on the floor that can make you trip. What can I do in the bedroom? Use night lights. Make sure that you have a light by your bed that is easy  to reach. Do not use any sheets or blankets that are too big for your bed. They should not hang down onto the floor. Have a firm chair that has side arms. You can use this for support while you get dressed. Do not have throw rugs and other things on the floor that can make you trip. What can I do in the kitchen? Clean up any spills right away. Avoid walking on wet floors. Keep items that you use a lot in easy-to-reach places. If you need to reach something above you, use a strong step stool that has a grab bar. Keep electrical cords out of the way. Do not use floor polish or wax that makes floors slippery. If you must use wax, use non-skid floor wax. Do not have throw rugs and other things on the floor that can make you trip. What can I do with my stairs? Do not leave any items on the stairs. Make sure that there are handrails on both sides of the stairs and use them. Fix handrails that are broken or loose. Make sure that  handrails are as long as the stairways. Check any carpeting to make sure that it is firmly attached to the stairs. Fix any carpet that is loose or worn. Avoid having throw rugs at the top or bottom of the stairs. If you do have throw rugs, attach them to the floor with carpet tape. Make sure that you have a light switch at the top of the stairs and the bottom of the stairs. If you do not have them, ask someone to add them for you. What else can I do to help prevent falls? Wear shoes that: Do not have high heels. Have rubber bottoms. Are comfortable and fit you well. Are closed at the toe. Do not wear sandals. If you use a stepladder: Make sure that it is fully opened. Do not climb a closed stepladder. Make sure that both sides of the stepladder are locked into place. Ask someone to hold it for you, if possible. Clearly mark and make sure that you can see: Any grab bars or handrails. First and last steps. Where the edge of each step is. Use tools that help you move around (mobility aids) if they are needed. These include: Canes. Walkers. Scooters. Crutches. Turn on the lights when you go into a dark area. Replace any light bulbs as soon as they burn out. Set up your furniture so you have a clear path. Avoid moving your furniture around. If any of your floors are uneven, fix them. If there are any pets around you, be aware of where they are. Review your medicines with your doctor. Some medicines can make you feel dizzy. This can increase your chance of falling. Ask your doctor what other things that you can do to help prevent falls. This information is not intended to replace advice given to you by your health care provider. Make sure you discuss any questions you have with your health care provider. Document Released: 04/07/2009 Document Revised: 11/17/2015 Document Reviewed: 07/16/2014 Elsevier Interactive Patient Education  2017 [employer].

## 2022-06-08 NOTE — Telephone Encounter (Signed)
Pt aware and voices understanding.   

## 2022-06-08 NOTE — Telephone Encounter (Signed)
I sent an rx for amoxicillin to her pharmacy. If symptoms worse nor if not improved on Monday please schedule an in office visit.

## 2022-06-08 NOTE — Progress Notes (Signed)
Subjective:   [name] is a 61 y.o. female who presents for Medicare Annual (Subsequent) preventive examination.  I connected with  [name] on 06/08/22 by a audio enabled telemedicine application and verified that I am speaking with the correct person using two identifiers.  Patient Location: Home  Provider Location: Office/Clinic  I discussed the limitations of evaluation and management by telemedicine. The patient expressed understanding and agreed to proceed.   Review of Systems    Defer to PCP Cardiac Risk Factors include: diabetes mellitus;dyslipidemia;hypertension;obesity (BMI >30kg/m2)     Objective:    There were no vitals filed for this visit. There is no height or weight on file to calculate BMI.     06/08/2022    9:02 AM 09/01/2021    7:44 AM 06/02/2021    9:06 AM 07/15/2020    3:17 PM 01/27/2020    1:16 PM 07/31/2019   12:18 PM 07/28/2019   10:31 AM  Advanced Directives  Does Patient Have a Medical Advance Directive? No No No No Yes Yes No  Type of Personnel officer;Living will [place];Living will   Does patient want to make changes to medical advance directive?     No - Patient declined    Copy of [place] in Chart?     No - copy requested    Would patient like information on creating a medical advance directive? No - Patient declined No - Patient declined Yes (MAU/Ambulatory/Procedural Areas - Information given) No - Patient declined   No - Patient declined    Current Medications (verified) Outpatient Encounter Medications as of 06/08/2022  Medication Sig   albuterol (VENTOLIN HFA) 108 (90 Base) MCG/ACT inhaler INHALE 2 PUFFS BY MOUTH 4 TIMES DAILY (Patient taking differently: Inhale 2 puffs into the lungs every 6 (six) hours as needed for wheezing or shortness of breath.)   alclomethasone (ACLOVATE) 0.05 % cream Apply topically 2 (two) times daily as needed (Rash).    amLODipine (NORVASC) 10 MG tablet Take 1 tablet by mouth once daily   aspirin 81 MG chewable tablet Chew 1 tablet (81 mg total) by mouth daily.   Cholecalciferol (VITAMIN D3) 75 MCG (3000 UT) TABS Take 1 tablet by mouth daily.   clobetasol (TEMOVATE) 0.05 % external solution Apply 1 application topically 2 (two) times daily.   clotrimazole-betamethasone (LOTRISONE) cream Apply 1 application topically 2 (two) times daily.   Evolocumab (REPATHA SURECLICK) 390 MG/ML SOAJ Inject 1 pen (140 mg) subcutaneously every 14 days   fluticasone (FLONASE) 50 MCG/ACT nasal spray Place 2 sprays into both nostrils daily.   hyoscyamine (LEVSIN) 0.125 MG tablet Take 1 tablet (0.125 mg total) by mouth every 4 (four) hours as needed for cramping (diarrhea,nausea).   imipramine (TOFRANIL) 25 MG tablet Take 50 mg by mouth at bedtime.   isosorbide mononitrate (IMDUR) 60 MG 24 hr tablet Take 1.5 tablets (90 mg total) by mouth daily.   loratadine (CLARITIN) 10 MG tablet Take 1 tablet (10 mg total) by mouth daily.   methimazole (TAPAZOLE) 5 MG tablet Take 1 tablet (5 mg total) by mouth daily.   methocarbamol (ROBAXIN) 500 MG tablet Take 1 tablet (500 mg total) by mouth every 8 (eight) hours as needed for muscle spasms.   metoCLOPramide (REGLAN) 5 MG tablet TAKE 1 TABLET BY MOUTH THREE TIMES DAILY BEFORE MEAL(S)   metoprolol succinate (TOPROL-XL) 100 MG 24 hr tablet Take 1 tablet (  100 mg total) by mouth daily. Take with or immediately following a meal.   nitroGLYCERIN (NITROSTAT) 0.4 MG SL tablet Place 1 tablet (0.4 mg total) under the tongue every 5 (five) minutes as needed for chest pain.   nystatin (MYCOSTATIN/NYSTOP) powder Apply 1 application topically 2 (two) times daily. Beneath both breasts   ondansetron (ZOFRAN) 4 MG tablet Take 1 tab by mouth every 6 hours as needed for nausea.   potassium chloride SA (KLOR-CON M20) 20 MEQ tablet Take 1 tablet (20 mEq total) by mouth daily.   rosuvastatin (CRESTOR) 40 MG tablet  Take 1 tablet (40 mg total) by mouth daily.   SUMAtriptan (IMITREX) 50 MG tablet Take 1 tablet (50 mg total) by mouth every 2 (two) hours as needed for migraine. May repeat in 2 hours if headache persists or recurs.   No facility-administered encounter medications on file as of 06/08/2022.    Allergies (verified) Ace inhibitors, Celebrex [celecoxib], and Diovan [valsartan]   History: 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/08- benign   Iron deficiency anemia    Migraine headache    Non-compliance    Peptic ulcer disease    Thyroid disease    Past Surgical History:  Procedure Laterality Date   ABDOMINAL EXPLORATION SURGERY     w/bso    BREAST BIOPSY     CARDIAC CATHETERIZATION  2009   mild non obstructive CAD   CHOLECYSTECTOMY     COLONOSCOPY     ESOPHAGEAL MANOMETRY N/A 08/01/2020   Procedure: ESOPHAGEAL MANOMETRY (EM);  Surgeon: Yetta Flock, MD;  Location: [facility];  Service: Gastroenterology;  Laterality: N/A;   KNEE SURGERY  2005    left knee   LEFT HEART CATH AND CORONARY ANGIOGRAPHY N/A 07/17/2017   Procedure: LEFT HEART CATH AND CORONARY ANGIOGRAPHY;  Surgeon: Martinique, Peter M, MD;  Location: [facility];  Service: Cardiovascular;  Laterality: N/A;   LEFT HEART CATH AND CORONARY ANGIOGRAPHY N/A 09/01/2021   Procedure: LEFT HEART CATH AND CORONARY ANGIOGRAPHY;  Surgeon: Burnell Blanks, MD;  Location: [facility];  Service: Cardiovascular;  Laterality: N/A;   POLYPECTOMY     TOTAL ABDOMINAL  HYSTERECTOMY     UPPER GASTROINTESTINAL ENDOSCOPY     Family History  Problem Relation Age of Onset   Hypertension Mother    Rheum arthritis Mother    Hypertension Father    Diabetes Father    Prostate cancer Father    Kidney disease Father    Diabetes Sister    Fibromyalgia Sister    Diabetes Maternal Grandmother    Lung cancer Maternal Grandfather    Arthritis Brother    Migraines Brother    CAD Brother    Fibromyalgia Sister    Migraines Sister    Colon cancer Neg Hx    Thyroid disease Neg Hx    Colon polyps Neg Hx    Social History  Socioeconomic History   Marital status: Widowed    Spouse name: Not on file   Number of children: 2   Years of education: Not on file   Highest education level: Not on file  Occupational History   Occupation: DISABILITY    Employer: UNEMPLOYED  Tobacco Use   Smoking status: Former    Packs/day: 0.50    Years: 18.00    Total pack years: 9.00    Types: Cigarettes    Start date: 07/29/1977    Quit date: 06/25/1994    Years since quitting: 27.9   Smokeless tobacco: Never   Tobacco comments:    quit 19 years ago  Vaping Use   Vaping Use: Never used  Substance and Sexual Activity   Alcohol use: Yes    Alcohol/week: 0.0 standard drinks of alcohol    Comment: social drinker   Drug use: No   Sexual activity: Not Currently  Other Topics Concern   Not on file  Social History Narrative   Widowed   Has 2 grown children.  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    Former Smoker Quit tobacco in 1996.  She was a pack a day smoker for approximately 10 years.    Alcohol use-yes: Social    Daily Caffeine Use:6 pack of pepsi daily     Illicit Drug Use - no    Patient does not get regular exercise.       Smoking Status:  quit   Social Determinants of Health   Financial Resource Strain: Low Risk  (06/02/2021)   Overall Financial Resource Strain (CARDIA)    Difficulty of Paying Living Expenses: Not  hard at all  Food Insecurity: No Food Insecurity (06/08/2022)   Hunger Vital Sign    Worried About Running Out of Food in the Last Year: Never true    Ran Out of Food in the Last Year: Never true  Transportation Needs: No Transportation Needs (06/08/2022)   PRAPARE - Hydrologist (Medical): No    Lack of Transportation (Non-Medical): No  Physical Activity: Insufficiently Active (06/02/2021)   Exercise Vital Sign    Days of Exercise per Week: 2 days    Minutes of Exercise per Session: 30 min  Stress: No Stress Concern Present (06/02/2021)   [place]    Feeling of Stress : Not at all  Social Connections: Moderately Isolated (06/02/2021)   Social Connection and Isolation Panel [NHANES]    Frequency of Communication with Friends and Family: More than three times a week    Frequency of Social Gatherings with Friends and Family: More than three times a week    Attends Religious Services: More than 4 times per year    Active Member of [employer] or Organizations: No    Attends Archivist Meetings: Never    Marital Status: Widowed    Tobacco Counseling Counseling given: Not Answered Tobacco comments: quit 19 years ago   Clinical Intake:  Pre-visit preparation completed: Yes  Pain : No/denies pain  How often do you need to have someone help you when you read instructions, pamphlets, or other written materials from your doctor or pharmacy?: 1 - Never  Diabetic? Nutrition Risk Assessment:  Has the patient had any N/V/D within the last 2 months?  No  Does the patient have any non-healing wounds?  No  Has the patient had any unintentional weight  loss or weight gain?  No   Diabetes:  Is the patient diabetic?  Yes  If diabetic, was a CBG obtained today?  No  Did the patient bring in their glucometer from home?  No  How often do you monitor your CBG's? Never.   Financial Strains and  Diabetes Management:  Are you having any financial strains with the device, your supplies or your medication? No .  Does the patient want to be seen by Chronic Care Management for management of their diabetes?  No  Would the patient like to be referred to a Nutritionist or for Diabetic Management?  No   Diabetic Exams:  Diabetic Eye Exam: Overdue for diabetic eye exam. Pt has been advised about the importance in completing this exam. Patient advised to call and schedule an eye exam. Diabetic Foot Exam: Overdue, Pt has been advised about the importance in completing this exam. Pt is scheduled for diabetic foot exam on N/a.  Interpreter Needed?: No  Information entered by :: Beatris Ship, [place]   Activities of Daily Living    06/08/2022    9:05 AM  In your present state of health, do you have any difficulty performing the following activities:  Hearing? 0  Vision? 0  Difficulty concentrating or making decisions? 0  Walking or climbing stairs? 0  Dressing or bathing? 0  Doing errands, shopping? 0  Preparing Food and eating ? N  Using the Toilet? N  In the past six months, have you accidently leaked urine? N  Do you have problems with loss of bowel control? N  Managing your Medications? N  Managing your Finances? N  Housekeeping or managing your Housekeeping? N    Patient Care Team: [name], NP as PCP - General (Internal Medicine) Fay Records, MD as PCP - Cardiology (Cardiology) [name], MD as Consulting Physician (Internal Medicine) Amalia Greenhouse, MD as Consulting Physician (Endocrinology) [name], MD as Consulting Physician (Oncology) Warren Danes, PA-C as Physician Assistant (Dermatology)  Indicate any recent Medical Services you may have received from other than [facility] providers in the past year (date may be approximate).     Assessment:   This is a routine wellness examination for Edwena.  Hearing/Vision screen No results  found.  Dietary issues and exercise activities discussed: Current Exercise Habits: The patient does not participate in regular exercise at present, Exercise limited by: None identified   Goals Addressed   None    Depression Screen    06/08/2022    9:04 AM 06/02/2021    9:10 AM 05/26/2021    7:53 AM 02/21/2021    7:29 AM 12/30/2020    8:47 AM 06/10/2020    8:10 AM 04/21/2019   11:07 AM  PHQ 2/9 Scores  PHQ - 2 Score 0 0 0 0 0 0 0  PHQ- 9 Score       6    Fall Risk    06/08/2022    9:03 AM 06/02/2021    9:08 AM 05/26/2021    7:53 AM 02/21/2021    7:29 AM 12/30/2020    8:47 AM  Fall Risk   Falls in the past year? 0 0 0 0 0  Number falls in past yr: 0 0 0 0 0  Injury with Fall? 0 0 0 0 0  Risk for fall due to : No Fall Risks  No Fall Risks No Fall Risks   Follow up Falls evaluation completed Falls prevention discussed Falls evaluation  completed Falls evaluation completed     FALL RISK PREVENTION PERTAINING TO THE HOME:  Any stairs in or around the home? Yes  If so, are there any without handrails? No  Home free of loose throw rugs in walkways, pet beds, electrical cords, etc? Yes  Adequate lighting in your home to reduce risk of falls? Yes   ASSISTIVE DEVICES UTILIZED TO PREVENT FALLS:  Life alert? No  Use of a cane, walker or w/c? No  Grab bars in the bathroom? No  Shower chair or bench in shower? No  Elevated toilet seat or a handicapped toilet? Yes   TIMED UP AND GO:  Was the test performed?  No, audio visit .    Cognitive Function:        06/08/2022    9:07 AM  6CIT Screen  What Year? 0 points  What month? 0 points  What time? 0 points  Count back from 20 0 points  Months in reverse 4 points  Repeat phrase 6 points  Total Score 10 points    Immunizations Immunization History  Administered Date(s) Administered   Fluad Quad(high Dose 65+) 03/31/2019   Influenza Split 04/30/2011   Influenza Whole 04/19/2006, 04/17/2007, 03/08/2009, 03/01/2010    Influenza, Quadrivalent, Recombinant, Inj, Pf 03/31/2019   Influenza, Seasonal, Injecte, Preservative Fre 05/30/2012   Influenza,inj,Quad PF,6+ Mos 02/15/2014, 03/08/2015, 04/30/2016, 04/24/2017, 04/04/2018, 06/10/2020   Influenza-Unspecified 02/23/2021   PFIZER(Purple Top)SARS-COV-2 Vaccination 09/07/2019, 09/29/2019, 05/20/2020   Pfizer Covid-19 Vaccine Bivalent Booster 89yr & up 02/23/2021   Pneumococcal Conjugate-13 04/04/2018   Pneumococcal Polysaccharide-23 03/08/2009, 09/23/2020   Td 05/17/1999   Tdap 04/30/2011   Zoster Recombinat (Shingrix) 03/31/2019, 08/02/2019    TDAP status: Due, Education has been provided regarding the importance of this vaccine. Advised may receive this vaccine at local pharmacy or Health Dept. Aware to provide a copy of the vaccination record if obtained from local pharmacy or Health Dept. Verbalized acceptance and understanding.  Flu Vaccine status: Declined, Education has been provided regarding the importance of this vaccine but patient still declined. Advised may receive this vaccine at local pharmacy or Health Dept. Aware to provide a copy of the vaccination record if obtained from local pharmacy or Health Dept. Verbalized acceptance and understanding.  Pneumococcal vaccine status: Up to date  Covid-19 vaccine status: Information provided on how to obtain vaccines.   Qualifies for Shingles Vaccine? Yes   Zostavax completed No   Shingrix Completed?: Yes  Screening Tests Health Maintenance  Topic Date Due   OPHTHALMOLOGY EXAM  Never done   FOOT EXAM  05/30/2013   DTaP/Tdap/Td (3 - Td or Tdap) 04/29/2021   COVID-19 Vaccine (5 - 2023-24 season) 02/23/2022   Medicare Annual Wellness (AWV)  06/02/2022   INFLUENZA VACCINE  09/23/2022 (Originally 01/23/2022)   HEMOGLOBIN A1C  08/06/2022   MAMMOGRAM  09/29/2022   Diabetic kidney evaluation - Urine ACR  12/02/2022   Diabetic kidney evaluation - eGFR measurement  03/02/2023   COLONOSCOPY (Pts 45-45yr Insurance coverage will need to be confirmed)  07/17/2027   Hepatitis C Screening  Completed   HIV Screening  Completed   Zoster Vaccines- Shingrix  Completed   HPV VACCINES  Aged Out    Health Maintenance  Health Maintenance Due  Topic Date Due   OPHTHALMOLOGY EXAM  Never done   FOOT EXAM  05/30/2013   DTaP/Tdap/Td (3 - Td or Tdap) 04/29/2021   COVID-19 Vaccine (5 - 2023-24 season) 02/23/2022   Medicare Annual Wellness (AWV)  06/02/2022    Colorectal cancer screening: Type of screening: Colonoscopy. Completed 07/16/17. Repeat every 10 years  Mammogram status: Completed 09/28/20. Repeat every year  Lung Cancer Screening: (Low Dose CT Chest recommended if Age 21-80 years, 30 pack-year currently smoking OR have quit w/in 15years.) does not qualify.   Additional Screening:  Hepatitis C Screening: does qualify; Completed 08/10/19  Vision Screening: Recommended annual ophthalmology exams for early detection of glaucoma and other disorders of the eye. Is the patient up to date with their annual eye exam?  Yes  Who is the provider or what is the name of the office in which the patient attends annual eye exams? [facility] If pt is not established with a provider, would they like to be referred to a provider to establish care? No .   Dental Screening: Recommended annual dental exams for proper oral hygiene  Community Resource Referral / Chronic Care Management: CRR required this visit?  No   CCM required this visit?  No      Plan:     I have personally reviewed and noted the following in the patient's chart:   Medical and social history Use of alcohol, tobacco or illicit drugs  Current medications and supplements including opioid prescriptions. Patient is not currently taking opioid prescriptions. Functional ability and status Nutritional status Physical activity Advanced directives List of other physicians Hospitalizations, surgeries, and ER visits in previous 12  months Vitals Screenings to include cognitive, depression, and falls Referrals and appointments  In addition, I have reviewed and discussed with patient certain preventive protocols, quality metrics, and best practice recommendations. A written personalized care plan for preventive services as well as general preventive health recommendations were provided to patient.   Due to this being a telephonic visit, the after visit summary with patients personalized plan was offered to patient via mail or my-chart. Per request, patient was mailed a copy of AVS.  Beatris Ship, [place]   06/08/2022   Nurse Notes: None

## 2022-06-13 ENCOUNTER — Ambulatory Visit (INDEPENDENT_AMBULATORY_CARE_PROVIDER_SITE_OTHER): Payer: Medicare Other | Admitting: Family

## 2022-06-13 VITALS — BP 150/86 | HR 80 | Temp 97.9°F | Resp 18 | Ht 64.0 in | Wt 233.0 lb

## 2022-06-13 DIAGNOSIS — I10 Essential (primary) hypertension: Secondary | ICD-10-CM | POA: Diagnosis not present

## 2022-06-13 DIAGNOSIS — H6091 Unspecified otitis externa, right ear: Secondary | ICD-10-CM | POA: Insufficient documentation

## 2022-06-13 MED ORDER — CARVEDILOL 12.5 MG PO TABS: 12.5000 mg | ORAL_TABLET | Freq: Two times a day (BID) | ORAL | 3 refills | Status: DC

## 2022-06-13 MED ORDER — METOPROLOL SUCCINATE ER 100 MG PO TB24: 100.0000 mg | ORAL_TABLET | Freq: Every day | ORAL | 1 refills | Status: DC

## 2022-06-13 MED ORDER — NEOMYCIN-POLYMYXIN-HC 3.5-10000-1 OT SOLN: 3.0000 [drp] | Freq: Four times a day (QID) | OTIC | 0 refills | Status: DC

## 2022-06-13 NOTE — Assessment & Plan Note (Addendum)
New.  I think she may also have a resolving OM.  Ear was irrigated gently by CMA- pt tolerated procedure without difficulty. Pt also advised to complete amoxicillin rx .

## 2022-06-13 NOTE — Progress Notes (Signed)
Subjective:     Patient ID: [name], female    DOB: Jun 16, 1961, 61 y.o.   MRN: [id number]  Chief Complaint  Patient presents with   Ear Pain    Onset: 1 week , right ear    HPI Patient is in today for follow up of her right ear otalgia. Present x 1 week, associated tinnitus. On amoxicillin x 5 days with some improvement in her pain. No fever, no ear drainage.   Health Maintenance Due  Topic Date Due   OPHTHALMOLOGY EXAM  Never done   FOOT EXAM  05/30/2013   DTaP/Tdap/Td (3 - Td or Tdap) 04/29/2021   COVID-19 Vaccine (5 - 2023-24 season) 02/23/2022    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/08- benign   Iron deficiency anemia    Migraine headache    Non-compliance    Peptic ulcer disease    Thyroid disease     Past Surgical History:  Procedure Laterality Date   ABDOMINAL EXPLORATION SURGERY     w/bso    BREAST BIOPSY     CARDIAC CATHETERIZATION  2009   mild non obstructive CAD   CHOLECYSTECTOMY     COLONOSCOPY     ESOPHAGEAL MANOMETRY N/A 08/01/2020   Procedure: ESOPHAGEAL MANOMETRY (EM);  Surgeon: Yetta Flock, MD;  Location: [facility];  Service: Gastroenterology;  Laterality: N/A;   KNEE SURGERY  2005    left knee   LEFT HEART CATH AND CORONARY ANGIOGRAPHY N/A 07/17/2017   Procedure: LEFT HEART CATH AND CORONARY ANGIOGRAPHY;  Surgeon: Martinique, Peter M, MD;  Location: [facility];  Service: Cardiovascular;  Laterality: N/A;   LEFT HEART CATH AND CORONARY ANGIOGRAPHY N/A 09/01/2021   Procedure:  LEFT HEART CATH AND CORONARY ANGIOGRAPHY;  Surgeon: Burnell Blanks, MD;  Location: [facility];  Service: Cardiovascular;  Laterality: N/A;   POLYPECTOMY     TOTAL ABDOMINAL HYSTERECTOMY     UPPER GASTROINTESTINAL ENDOSCOPY      Family History  Problem Relation Age of Onset   Hypertension Mother    Rheum arthritis Mother    Hypertension Father    Diabetes Father    Prostate cancer Father    Kidney disease Father    Diabetes Sister    Fibromyalgia Sister    Diabetes Maternal Grandmother    Lung cancer Maternal Grandfather    Arthritis Brother    Migraines Brother    CAD Brother    Fibromyalgia Sister    Migraines Sister    Colon cancer Neg Hx    Thyroid disease Neg Hx    Colon polyps Neg Hx     Social History   Socioeconomic History   Marital status: Widowed    Spouse name: Not on file   Number of children: 2   Years of education: Not on file   Highest education level: Not on file  Occupational History  Occupation: DISABILITY    Employer: UNEMPLOYED  Tobacco Use   Smoking status: Former    Packs/day: 0.50    Years: 18.00    Total pack years: 9.00    Types: Cigarettes    Start date: 07/29/1977    Quit date: 06/25/1994    Years since quitting: 27.9   Smokeless tobacco: Never   Tobacco comments:    quit 19 years ago  Vaping Use   Vaping Use: Never used  Substance and Sexual Activity   Alcohol use: Yes    Alcohol/week: 0.0 standard drinks of alcohol    Comment: social drinker   Drug use: No   Sexual activity: Not Currently  Other Topics Concern   Not on file  Social History Narrative   Widowed   Has 2 grown children.  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    Former Smoker Quit tobacco in 1996.  She was a pack a day smoker for approximately 10 years.    Alcohol use-yes: Social    Daily Caffeine Use:6 pack of pepsi daily     Illicit Drug Use - no    Patient does not get regular exercise.        Smoking Status:  quit   Social Determinants of Health   Financial Resource Strain: Low Risk  (06/02/2021)   Overall Financial Resource Strain (CARDIA)    Difficulty of Paying Living Expenses: Not hard at all  Food Insecurity: No Food Insecurity (06/08/2022)   Hunger Vital Sign    Worried About Running Out of Food in the Last Year: Never true    Ran Out of Food in the Last Year: Never true  Transportation Needs: No Transportation Needs (06/08/2022)   PRAPARE - Hydrologist (Medical): No    Lack of Transportation (Non-Medical): No  Physical Activity: Insufficiently Active (06/02/2021)   Exercise Vital Sign    Days of Exercise per Week: 2 days    Minutes of Exercise per Session: 30 min  Stress: No Stress Concern Present (06/02/2021)   [place]    Feeling of Stress : Not at all  Social Connections: Moderately Isolated (06/02/2021)   Social Connection and Isolation Panel [NHANES]    Frequency of Communication with Friends and Family: More than three times a week    Frequency of Social Gatherings with Friends and Family: More than three times a week    Attends Religious Services: More than 4 times per year    Active Member of [employer] or Organizations: No    Attends Archivist Meetings: Never    Marital Status: Widowed  Intimate Partner Violence: Not At Risk (06/08/2022)   Humiliation, Afraid, Rape, and Kick questionnaire    Fear of Current or Ex-Partner: No    Emotionally Abused: No    Physically Abused: No    Sexually Abused: No    Outpatient Medications Prior to Visit  Medication Sig Dispense Refill   albuterol (VENTOLIN HFA) 108 (90 Base) MCG/ACT inhaler INHALE 2 PUFFS BY MOUTH 4 TIMES DAILY (Patient taking differently: Inhale 2 puffs into the lungs every 6 (six) hours as needed for wheezing or shortness of breath.) 9 g 0   alclomethasone (ACLOVATE) 0.05 % cream Apply topically 2  (two) times daily as needed (Rash). 180 g 3   amLODipine (NORVASC) 10 MG tablet Take 1 tablet by mouth once daily 90 tablet 0  amoxicillin (AMOXIL) 500 MG capsule Take 1 capsule (500 mg total) by mouth 3 (three) times daily for 10 days. 30 capsule 0   aspirin 81 MG chewable tablet Chew 1 tablet (81 mg total) by mouth daily.     Cholecalciferol (VITAMIN D3) 75 MCG (3000 UT) TABS Take 1 tablet by mouth daily. 30 tablet    clobetasol (TEMOVATE) 0.05 % external solution Apply 1 application topically 2 (two) times daily. 50 mL 6   clotrimazole-betamethasone (LOTRISONE) cream Apply 1 application topically 2 (two) times daily. 30 g 0   Evolocumab (REPATHA SURECLICK) 789 MG/ML SOAJ Inject 1 pen (140 mg) subcutaneously every 14 days 6 mL 3   fluticasone (FLONASE) 50 MCG/ACT nasal spray Place 2 sprays into both nostrils daily. 16 g 1   hyoscyamine (LEVSIN) 0.125 MG tablet Take 1 tablet (0.125 mg total) by mouth every 4 (four) hours as needed for cramping (diarrhea,nausea). 90 tablet 1   imipramine (TOFRANIL) 25 MG tablet Take 50 mg by mouth at bedtime. 60 tablet 1   isosorbide mononitrate (IMDUR) 60 MG 24 hr tablet Take 1.5 tablets (90 mg total) by mouth daily. 135 tablet 3   loratadine (CLARITIN) 10 MG tablet Take 1 tablet (10 mg total) by mouth daily. 10 tablet 0   methimazole (TAPAZOLE) 5 MG tablet Take 1 tablet (5 mg total) by mouth daily. 90 tablet 0   methocarbamol (ROBAXIN) 500 MG tablet Take 1 tablet (500 mg total) by mouth every 8 (eight) hours as needed for muscle spasms. 30 tablet 0   metoCLOPramide (REGLAN) 5 MG tablet TAKE 1 TABLET BY MOUTH THREE TIMES DAILY BEFORE MEAL(S) 90 tablet 0   nitroGLYCERIN (NITROSTAT) 0.4 MG SL tablet Place 1 tablet (0.4 mg total) under the tongue every 5 (five) minutes as needed for chest pain. 25 tablet 3   nystatin (MYCOSTATIN/NYSTOP) powder Apply 1 application topically 2 (two) times daily. Beneath both breasts 60 g 1   ondansetron (ZOFRAN) 4 MG tablet Take 1 tab  by mouth every 6 hours as needed for nausea. 100 tablet 1   potassium chloride SA (KLOR-CON M20) 20 MEQ tablet Take 1 tablet (20 mEq total) by mouth daily. 30 tablet 11   rosuvastatin (CRESTOR) 40 MG tablet Take 1 tablet (40 mg total) by mouth daily. 90 tablet 3   SUMAtriptan (IMITREX) 50 MG tablet Take 1 tablet (50 mg total) by mouth every 2 (two) hours as needed for migraine. May repeat in 2 hours if headache persists or recurs. 10 tablet 5   metoprolol succinate (TOPROL-XL) 100 MG 24 hr tablet Take 1 tablet (100 mg total) by mouth daily. Take with or immediately following a meal. 90 tablet 1   No facility-administered medications prior to visit.    Allergies  Allergen Reactions   Ace Inhibitors Cough   Celebrex [Celecoxib] Other (See Comments)    "makes me bleed"   Diovan [Valsartan]     angioedema    ROS See HPI    Objective:    Physical Exam Constitutional:      General: She is not in acute distress.    Appearance: Normal appearance. She is well-developed.  HENT:     Head: Normocephalic and atraumatic.     Right Ear: Drainage (exudate and erythema noted right canal.) present. Tympanic membrane is erythematous (mild).     Left Ear: Tympanic membrane, ear canal and external ear normal.  Eyes:     General: No scleral icterus. Neck:     Thyroid: No  thyromegaly.  Cardiovascular:     Rate and Rhythm: Normal rate and regular rhythm.     Heart sounds: Normal heart sounds. No murmur heard. Pulmonary:     Effort: Pulmonary effort is normal. No respiratory distress.     Breath sounds: Normal breath sounds. No wheezing.  Musculoskeletal:     Cervical back: Neck supple.  Skin:    General: Skin is warm and dry.  Neurological:     Mental Status: She is alert and oriented to person, place, and time.  Psychiatric:        Mood and Affect: Mood normal.        Behavior: Behavior normal.        Thought Content: Thought content normal.        Judgment: Judgment normal.     BP  (!) 150/86   Pulse 80   Temp 97.9 F (36.6 C)   Resp 18   Ht '5\' 4"'$  (1.626 m)   Wt 233 lb (105.7 kg)   SpO2 97%   BMI 39.99 kg/m  Wt Readings from Last 3 Encounters:  06/13/22 233 lb (105.7 kg)  05/11/22 230 lb (104.3 kg)  03/30/22 232 lb (105.2 kg)       Assessment & Plan:   Problem List Items Addressed This Visit       Unprioritized   Otitis externa of right ear - Primary    New.  I think she may also have a resolving OM.  Ear was irrigated gently by CMA- pt tolerated procedure without difficulty.       Essential hypertension, benign    Uncontrolled. States she is not taking metoprolol which is on her med list. Plan to restart.  Left message to cancel the carvedilol rx at her pharmacy.       Relevant Medications   metoprolol succinate (TOPROL-XL) 100 MG 24 hr tablet    I have discontinued Riki Sheer. Hilburn's metoprolol succinate and carvedilol. I am also having her start on neomycin-polymyxin-hydrocortisone and metoprolol succinate. Additionally, I am having her maintain her aspirin, nitroGLYCERIN, nystatin, ondansetron, loratadine, fluticasone, alclomethasone, clobetasol, clotrimazole-betamethasone, albuterol, isosorbide mononitrate, methocarbamol, potassium chloride SA, hyoscyamine, imipramine, SUMAtriptan, Vitamin D3, rosuvastatin, metoCLOPramide, amLODipine, Repatha SureClick, methimazole, and amoxicillin.  Meds ordered this encounter  Medications   DISCONTD: carvedilol (COREG) 12.5 MG tablet    Sig: Take 1 tablet (12.5 mg total) by mouth 2 (two) times daily with a meal.    Dispense:  60 tablet    Refill:  3    Order Specific Question:   Supervising Provider    Answer:   Penni Homans A [[id number]]   neomycin-polymyxin-hydrocortisone (CORTISPORIN) OTIC solution    Sig: Place 3 drops into the right ear 4 (four) times daily.    Dispense:  10 mL    Refill:  0    Order Specific Question:   Supervising Provider    Answer:   Penni Homans A [[id number]]   metoprolol succinate  (TOPROL-XL) 100 MG 24 hr tablet    Sig: Take 1 tablet (100 mg total) by mouth daily. Take with or immediately following a meal.    Dispense:  90 tablet    Refill:  1    Order Specific Question:   Supervising Provider    Answer:   Penni Homans A [[id number]]

## 2022-06-13 NOTE — Patient Instructions (Signed)
Restart Metoprolol. Start ear drops. Continue amoxicillin.

## 2022-06-13 NOTE — Assessment & Plan Note (Signed)
Uncontrolled. States she is not taking metoprolol which is on her med list. Plan to restart.  Left message to cancel the carvedilol rx at her pharmacy.

## 2022-06-25 ENCOUNTER — Encounter: Payer: Self-pay | Admitting: Family

## 2022-06-29 ENCOUNTER — Encounter: Payer: Self-pay | Admitting: Internal Medicine

## 2022-06-29 ENCOUNTER — Ambulatory Visit: Payer: BC Managed Care – PPO | Attending: Internal Medicine | Admitting: Internal Medicine

## 2022-06-29 VITALS — BP 136/76 | HR 55 | Ht 64.0 in | Wt 236.0 lb

## 2022-06-29 DIAGNOSIS — I25118 Atherosclerotic heart disease of native coronary artery with other forms of angina pectoris: Secondary | ICD-10-CM | POA: Diagnosis not present

## 2022-06-29 DIAGNOSIS — R0683 Snoring: Secondary | ICD-10-CM | POA: Diagnosis not present

## 2022-06-29 DIAGNOSIS — I10 Essential (primary) hypertension: Secondary | ICD-10-CM | POA: Diagnosis not present

## 2022-06-29 DIAGNOSIS — I700 Atherosclerosis of aorta: Secondary | ICD-10-CM | POA: Diagnosis not present

## 2022-06-29 NOTE — Progress Notes (Deleted)
Patient Name:         DOB:       Height:     Weight:  Office Name:         Referring Provider:  Today's Date:  Date:   STOP BANG RISK ASSESSMENT S (snore) Have you been told that you snore? yes     YES/NO   T (tired) Are you often tired, fatigued, or sleepy during the day? yes  YES/NO  O (obstruction) Do you stop breathing, choke, or gasp during sleep? no YES/NO   P (pressure) Do you have or are you being treated for high blood pressure? yes YES/NO   B (BMI) Is your body index greater than 35 kg/m? yes YES/NO   A (age) Are you 64 years old or older? yes YES/NO   N (neck) Do you have a neck circumference greater than 16 inches? yes  YES/NO   G (gender) Are you a female?no YES/NO   TOTAL STOP/BANG "YES" ANSWERS                                                                        For Office Use Only              Procedure Order Form    YES to 3+ Stop Bang questions OR two clinical symptoms - patient qualifies for WatchPAT (CPT 95800)             Clinical Notes: Will consult Sleep Specialist and refer for management of therapy due to patient increased risk of Sleep Apnea. Ordering a sleep study due to the following two clinical symptoms: Excessive daytime sleepiness G47.10 / Gastroesophageal reflux K21.9 / Nocturia R35.1 / Morning Headaches G44.221 / Difficulty concentrating R41.840 / Memory problems or poor judgment G31.84 / Personality changes or irritability R45.4 / Loud snoring R06.83 / Depression F32.9 / Unrefreshed by sleep G47.8 / Impotence N52.9 / History of high blood pressure R03.0 / Insomnia G47.00    I understand that I am proceeding with a home sleep apnea test as ordered by my treating physician. I understand that untreated sleep apnea is a serious cardiovascular risk factor and it is my responsibility to perform the test and seek management for sleep apnea. I will be contacted with the results and be managed for sleep apnea by a local sleep physician. I will be  receiving equipment and further instructions from [facility]. I shall promptly ship back the equipment via the included mailing label. I understand my insurance will be billed for the test and as the patient I am responsible for any insurance related out-of-pocket costs incurred. I have been provided with written instructions and can call for additional video or telephonic instruction, with 24-hour availability of qualified personnel to answer any questions: Patient Help Desk [phone].  Patient Signature ______________________________________________________   Date______________________ Patient Telemedicine Verbal Consent

## 2022-06-29 NOTE — Progress Notes (Signed)
Cardiology Office Note   ID:  [name], [name] 1961-06-25, MRN [id number]  PCP:  [name], NP  Cardiologist:   [name], MD   Pt presents for f/u of CAD    History of Present Illness: [name] is a 62 y.o. female with a hx of CP and CAD   Also hx of esophageal strictures   In Jan 2019  with CP and vomiting  that occurred after outpt EGD/dilitation  Troponin was elevated  She went on to cardiac cath in 2019 which showed 75% mid PDA (small vessel)  Prox RCA with 25%  OM 50%  LVEF normal  Focal wall motion abnormality  ? 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   amLODipine (NORVASC) 10 MG tablet Take 1 tablet by mouth once daily   aspirin 81 MG chewable tablet Chew 1 tablet (81 mg total) by mouth daily.   Cholecalciferol (VITAMIN D3) 75 MCG (3000 UT) TABS Take 1 tablet by mouth daily.   clobetasol (TEMOVATE) 0.05 % external solution Apply 1 application topically 2 (two) times daily.   clotrimazole-betamethasone (LOTRISONE) cream Apply 1 application topically 2 (two) times daily.   Evolocumab (REPATHA SURECLICK) 630 MG/ML SOAJ Inject 1 pen (140 mg) subcutaneously every 14 days   fluticasone (FLONASE) 50 MCG/ACT nasal spray Place 2 sprays into both nostrils daily.   hyoscyamine (LEVSIN) 0.125 MG tablet Take 1 tablet (0.125 mg total) by  mouth every 4 (four) hours as needed for cramping (diarrhea,nausea).   imipramine (TOFRANIL) 25 MG tablet Take 50 mg by mouth at bedtime.   isosorbide mononitrate (IMDUR) 60 MG 24 hr tablet Take 1.5 tablets (90 mg total) by mouth daily.   loratadine (CLARITIN) 10 MG tablet Take 1 tablet (10 mg total) by mouth daily.   methimazole (TAPAZOLE) 5 MG tablet Take 1 tablet (5 mg total) by mouth daily.   methocarbamol (ROBAXIN) 500 MG tablet Take 1 tablet (500 mg total) by mouth every 8 (eight) hours as needed for muscle spasms.   metoCLOPramide (REGLAN) 5 MG tablet TAKE 1 TABLET BY MOUTH THREE TIMES DAILY BEFORE MEAL(S)   metoprolol succinate (TOPROL-XL) 100 MG 24 hr tablet Take 1 tablet (100 mg total) by mouth daily. Take with or immediately following a meal.   neomycin-polymyxin-hydrocortisone (CORTISPORIN) OTIC solution Place 3 drops into the right ear 4 (four) times daily.   nitroGLYCERIN (NITROSTAT) 0.4 MG SL tablet Place 1 tablet (0.4 mg total) under the tongue every 5 (five) minutes as needed for chest pain.   nystatin (MYCOSTATIN/NYSTOP) powder Apply 1 application topically 2 (two) times daily. Beneath both breasts   ondansetron (  ZOFRAN) 4 MG tablet Take 1 tab by mouth every 6 hours as needed for nausea.   potassium chloride SA (KLOR-CON M20) 20 MEQ tablet Take 1 tablet (20 mEq total) by mouth daily.   rosuvastatin (CRESTOR) 40 MG tablet Take 1 tablet (40 mg total) by mouth daily.   SUMAtriptan (IMITREX) 50 MG tablet Take 1 tablet (50 mg total) by mouth every 2 (two) hours as needed for migraine. May repeat in 2 hours if headache persists or recurs.   XIIDRA 5 % SOLN Apply 1 drop to eye 2 (two) times daily.     Allergies:   Ace inhibitors, Celebrex [celecoxib], and Diovan [valsartan]   Past Medical History:  Diagnosis Date   Allergy    allergic rhinitis   Arthritis    Atypical chest pain    Constipation    Cyst, ovarian    Diabetes mellitus, type II (HCC)    Elevated alkaline  phosphatase level    Endometriosis    Esophageal stricture    Fatty liver    Gastroparesis    GERD (gastroesophageal reflux disease)    Heart murmur    Hepatic hemangioma    Hyperlipidemia    Hypertension    Hypertrophic condition of skin    acrokeratoelastoidosis- s/p 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 1/08- benign   Iron deficiency anemia    Migraine headache    Non-compliance    Peptic ulcer disease    Thyroid disease     Past Surgical History:  Procedure Laterality Date   ABDOMINAL EXPLORATION SURGERY     w/bso    BREAST BIOPSY     CARDIAC CATHETERIZATION  2009   mild non obstructive CAD   CHOLECYSTECTOMY     COLONOSCOPY     ESOPHAGEAL MANOMETRY N/A 08/01/2020   Procedure: ESOPHAGEAL MANOMETRY (EM);  Surgeon: Yetta Flock, MD;  Location: [facility];  Service: Gastroenterology;  Laterality: N/A;   KNEE SURGERY  2005    left knee   LEFT HEART CATH AND CORONARY ANGIOGRAPHY N/A 07/17/2017   Procedure: LEFT HEART CATH AND CORONARY ANGIOGRAPHY;  Surgeon: Martinique, Peter M, MD;  Location: [facility];  Service: Cardiovascular;  Laterality: N/A;   LEFT HEART CATH AND CORONARY ANGIOGRAPHY N/A 09/01/2021   Procedure: LEFT HEART CATH AND CORONARY ANGIOGRAPHY;  Surgeon: Burnell Blanks, MD;  Location: [place] [facility];  Service: Cardiovascular;  Laterality: N/A;   POLYPECTOMY     TOTAL ABDOMINAL HYSTERECTOMY     UPPER GASTROINTESTINAL ENDOSCOPY       Social History:  The patient  reports that she quit smoking about 28 years ago. Her smoking use included cigarettes. She started smoking about 44 years ago. She has a 9.00 pack-year smoking history. She has never used smokeless tobacco. She reports current alcohol use. She reports that she does not use drugs.   Family History:  The patient's family history includes Arthritis in her brother; CAD in her brother; Diabetes in her father, maternal grandmother, and sister; Fibromyalgia in her sister and sister; Hypertension in her father  and mother; Kidney disease in her father; Lung cancer in her maternal grandfather; Migraines in her brother and sister; Prostate cancer in her father; Rheum arthritis in her mother.    ROS:  Please see the history of present illness. All other systems are reviewed and  Negative to the above problem except as noted.    PHYSICAL EXAM: VS:  BP 136/76   Pulse (!) 55   Ht '5\' 4"'$  (1.626 m)  Wt 236 lb (107 kg)   SpO2 99%   BMI 40.51 kg/m   GEN: Morbidly obese 62 yo in no acute distress  HEENT: normal  Neck: no JVD Cardiac: RRR; no murmurs  No LE edema    Respiratory:  clear to auscultation bilaterally GI: soft, nontender, nondistended, + BS  No hepatomegaly  MS: no deformity Moving all extremities   Skin: warm and dry, no rash Neuro:  Strength and sensation are intact Psych: euthymic mood, full affect   EKG:  EKG is  ordered today   SB 55  IWMI  Poor R wave progression  Echo  June 2023  eft ventricular ejection fraction, by estimation, is 60 to 65%. Left ventricular ejection fraction by 3D volume is 61 %. The left ventricle has normal function. The left ventricle has no regional wall motion abnormalities. Left ventricular diastolic parameters are consistent with Grade I diastolic dysfunction (impaired relaxation). 1. Right ventricular systolic function is mildly reduced. The right ventricular size is normal. 2. The mitral valve is normal in structure. No evidence of mitral valve regurgitation. No evidence of mitral stenosis. 3. The aortic valve is normal in structure. There is severe calcifcation of the aortic valve. There is moderate thickening of the aortic valve. Aortic valve regurgitation is mild to moderate. Aortic valve sclerosis/calcification is present, without any evidence of aortic stenosis. 4. The inferior vena cava is dilated in size with >50% respiratory variability, suggesting right atrial pressure of 8 mmHg.  March  2023 LHC   Prox RCA to Mid RCA lesion is  25% stenosed.   Ost Ramus to Ramus lesion is 50% stenosed.   Dist RCA lesion is 40% stenosed.   RPDA lesion is 90% stenosed.   Prox Cx to Mid Cx lesion is 30% stenosed.   2nd Mrg lesion is 50% stenosed.   Mid LAD lesion is 30% stenosed.   The left ventricular systolic function is normal.   LV end diastolic pressure is normal.   The left ventricular ejection fraction is 55-65% by visual estimate.   There is no mitral valve regurgitation.   The LAD has mild non-obstructive disease The Circumflex has mild non-obstructive disease The intermediate branch is a moderate caliber vessel with moderate stenosis, unchanged from last cath in 2019 The RCA is a large dominant vessel with mild diffuse calcific disease in the proximal, mid and distal vessel. The small caliber PDA has severe stenosis, overall unchanged from cath in 2019. The PDA is too small for PCI.  Normal LV systolic function. Normal LVEDP.    Recommendations: The PDA is too small for PCI and is unchanged from cath in 2019. Her diffuse mild to moderate CAD is overall unchanged. Continue medical management of CAD.      Lipid Panel    Component Value Date/Time   CHOL 114 12/01/2021 0900   CHOL 92 (L) 11/24/2021 0737   TRIG 131.0 12/01/2021 0900   TRIG 103 04/30/2006 0949   HDL 50.10 12/01/2021 0900   HDL 54 11/24/2021 0737   CHOLHDL 2 12/01/2021 0900   VLDL 26.2 12/01/2021 0900   LDLCALC 38 12/01/2021 0900   LDLCALC 21 11/24/2021 0737   LDLDIRECT 76 07/21/2019 0756   LDLDIRECT 131.0 10/31/2016 1555      Wt Readings from Last 3 Encounters:  06/29/22 236 lb (107 kg)  06/13/22 233 lb (105.7 kg)  05/11/22 230 lb (104.3 kg)      ASSESSMENT AND PLAN:  1  CAD  Pt with CAD  Recent cath showed no change from previous cath   Distal PDA disease but otherwise nonobstructive   Follow  Rx risk factores  2  CP  Atypical  Pleuritic  Probaby chest wall  3  HTN  BP is a little eelvated   She says higher at times  at home     160 Will set up for sleep study   She had in past but didn't sleep enough she says     4  HL  LDL 38  HDL 50  Continue REpatha  5  Obesity   Discussed diet   LImit carbs   Increase protein, vegetables         Current medicines are reviewed at length with the patient today.  The patient does not have con Will get blood ork today      cerns regarding medicines.  Signed, [name], MD  06/29/2022 8:46 AM    [address], [place], [place]  [address] Phone: [phone]; Fax: [phone]

## 2022-06-29 NOTE — Patient Instructions (Addendum)
Medication Instructions:   *If you need a refill on your cardiac medications before your next appointment, please call your pharmacy*   Lab Work:  If you have labs (blood work) drawn today and your tests are completely normal, you will receive your results only by: [place] (if you have MyChart) OR A paper copy in the mail If you have any lab test that is abnormal or we need to change your treatment, we will call you to review the results.   Testing/Procedures: HOME SLEEP STUDY    Follow-Up: At [facility], you and your health needs are our priority.  As part of our continuing mission to provide you with exceptional heart care, we have created designated Provider Care Teams.  These Care Teams include your primary Cardiologist (physician) and Advanced Practice Providers (APPs -  Physician Assistants and Nurse Practitioners) who all work together to provide you with the care you need, when you need it.  We recommend signing up for the patient portal called "MyChart".  Sign up information is provided on this After Visit Summary.  MyChart is used to connect with patients for Virtual Visits (Telemedicine).  Patients are able to view lab/test results, encounter notes, upcoming appointments, etc.  Non-urgent messages can be sent to your provider as well.   To learn more about what you can do with MyChart, go to [email or web address].    Your next appointment:   6 month(s)  The format for your next appointment:   In Person  Provider:   [name], MD     Other Instructions   Important Information About Sugar

## 2022-07-03 ENCOUNTER — Telehealth: Payer: Self-pay | Admitting: *Deleted

## 2022-07-03 NOTE — Telephone Encounter (Signed)
Prior Authorization for HST sent to [facility] via web portal. Tracking Number . READY-do not require Pre-Authorization by [employer]

## 2022-07-03 NOTE — Telephone Encounter (Signed)
Sleep coordinator sent to me in error

## 2022-07-12 ENCOUNTER — Other Ambulatory Visit: Payer: Self-pay | Admitting: Family

## 2022-07-13 ENCOUNTER — Encounter: Payer: Self-pay | Admitting: Family

## 2022-07-18 ENCOUNTER — Encounter: Payer: Self-pay | Admitting: Gastroenterology

## 2022-07-20 ENCOUNTER — Encounter (HOSPITAL_BASED_OUTPATIENT_CLINIC_OR_DEPARTMENT_OTHER): Payer: Self-pay

## 2022-07-20 ENCOUNTER — Ambulatory Visit (HOSPITAL_BASED_OUTPATIENT_CLINIC_OR_DEPARTMENT_OTHER): Payer: BC Managed Care – PPO | Admitting: Cardiology

## 2022-07-20 DIAGNOSIS — I700 Atherosclerosis of aorta: Secondary | ICD-10-CM

## 2022-07-20 DIAGNOSIS — I10 Essential (primary) hypertension: Secondary | ICD-10-CM

## 2022-07-20 DIAGNOSIS — I25118 Atherosclerotic heart disease of native coronary artery with other forms of angina pectoris: Secondary | ICD-10-CM

## 2022-07-20 DIAGNOSIS — R0683 Snoring: Secondary | ICD-10-CM

## 2022-07-24 NOTE — Progress Notes (Signed)
This encounter was created in error - please disregard.

## 2022-07-24 NOTE — Procedures (Signed)
Erroneous encounter

## 2022-07-25 ENCOUNTER — Encounter (INDEPENDENT_AMBULATORY_CARE_PROVIDER_SITE_OTHER): Payer: 59 | Admitting: Cardiology

## 2022-07-25 ENCOUNTER — Ambulatory Visit (HOSPITAL_BASED_OUTPATIENT_CLINIC_OR_DEPARTMENT_OTHER): Payer: BC Managed Care – PPO | Attending: Internal Medicine | Admitting: Cardiology

## 2022-07-25 DIAGNOSIS — G4733 Obstructive sleep apnea (adult) (pediatric): Secondary | ICD-10-CM

## 2022-07-26 NOTE — Progress Notes (Signed)
This encounter was created in error - please disregard.

## 2022-07-26 NOTE — Procedures (Signed)
Erroneous encounter

## 2022-07-27 ENCOUNTER — Ambulatory Visit (INDEPENDENT_AMBULATORY_CARE_PROVIDER_SITE_OTHER): Payer: 59 | Admitting: Internal Medicine

## 2022-07-27 ENCOUNTER — Encounter: Payer: Self-pay | Admitting: Internal Medicine

## 2022-07-27 VITALS — BP 134/80 | HR 61 | Ht 64.0 in | Wt 232.4 lb

## 2022-07-27 DIAGNOSIS — E0590 Thyrotoxicosis, unspecified without thyrotoxic crisis or storm: Secondary | ICD-10-CM | POA: Diagnosis not present

## 2022-07-27 LAB — T3, FREE: T3, Free: 2.9 pg/mL (ref 2.3–4.2)

## 2022-07-27 LAB — T4, FREE: Free T4: 0.88 ng/dL (ref 0.60–1.60)

## 2022-07-27 LAB — TSH: TSH: 0.97 u[IU]/mL (ref 0.35–5.50)

## 2022-07-27 NOTE — Progress Notes (Unsigned)
Patient ID: [name], female   DOB: February 19, 1961, 62 y.o.   MRN: [id number]  HPI  Denise HOUDEK is a 62 y.o.-year-old female, returning for follow-up for thyrotoxicosis.  She previously saw Dr. Loanne Drilling, last visit with him 1 year and 2 months ago.  She was diagnosed with thyrotoxicosis in 2012.  She did not have thyroid imaging.  She is on methimazole 5 mg daily, tolerated well.  I reviewed pt's thyroid tests: Lab Results  Component Value Date   TSH 4.87 12/29/2021   TSH 1.56 05/05/2021   TSH 2.65 10/21/2020   TSH 2.16 04/08/2020   TSH 3.44 10/08/2019   TSH 2.87 04/07/2019   TSH 3.09 11/04/2018   TSH 1.02 08/06/2018   TSH 0.12 (L) 05/30/2018   TSH 0.75 12/17/2017   FREET4 0.76 12/29/2021   FREET4 1.03 05/05/2021   FREET4 0.70 10/21/2020   FREET4 0.82 04/08/2020   FREET4 0.79 10/08/2019   FREET4 0.77 04/07/2019   FREET4 0.67 11/04/2018   FREET4 0.85 08/06/2018   FREET4 0.75 05/30/2018   FREET4 0.95 07/08/2017   T3FREE 3.2 01/21/2017   Antithyroid antibodies: No results found for: "TSI"  Pt denies: - feeling nodules in neck - hoarseness - dysphagia - choking - SOB with lying down  She denies: - fatigue - excessive sweating/heat intolerance - tremors - anxiety - palpitations - hyperdefecation - weight loss - hair loss  Pt does not have a FH of thyroid ds. No FH of thyroid cancer. No h/o radiation tx to head or neck. No seaweed or kelp, no recent contrast studies. No steroid use. No herbal supplements. No Biotin use.  Pt. also has a history of CAD, well-controlled diabetes - managed by PCP, fatty liver, HL, HTN, hepatic hemangioma, iron deficiency anemia, GERD, endometriosis, ovarian cysts, migraines, osteoarthritis, low back pain, vitamin D deficiency.  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- benign   Iron deficiency anemia    Migraine headache    Non-compliance    Peptic ulcer disease    Thyroid disease    Past Surgical History:  Procedure Laterality Date   ABDOMINAL EXPLORATION SURGERY     w/bso    BREAST BIOPSY     CARDIAC CATHETERIZATION  2009   mild non obstructive CAD   CHOLECYSTECTOMY     COLONOSCOPY     ESOPHAGEAL MANOMETRY N/A 08/01/2020   Procedure: ESOPHAGEAL MANOMETRY (EM);  Surgeon: Yetta Flock, MD;  Location: [facility];  Service: Gastroenterology;  Laterality: N/A;   KNEE SURGERY  2005    left knee   LEFT HEART CATH AND CORONARY ANGIOGRAPHY N/A 07/17/2017   Procedure: LEFT HEART CATH AND CORONARY ANGIOGRAPHY;  Surgeon: Martinique, Peter M, MD;  Location: [place] [facility];  Service: Cardiovascular;  Laterality: N/A;   LEFT HEART CATH AND CORONARY ANGIOGRAPHY N/A 09/01/2021   Procedure: LEFT HEART CATH AND CORONARY ANGIOGRAPHY;  Surgeon: Burnell Blanks, MD;  Location: [place] [facility];  Service: Cardiovascular;  Laterality: N/A;   POLYPECTOMY     TOTAL ABDOMINAL HYSTERECTOMY     UPPER GASTROINTESTINAL ENDOSCOPY     Social History   Socioeconomic History   Marital status: Widowed    Spouse name: Not on file   Number of children: 2   Years of education: Not on file   Highest education level: Not on file  Occupational History   Occupation: DISABILITY    Employer: UNEMPLOYED  Tobacco Use   Smoking status: Former    Packs/day: 0.50    Years: 18.00    Total pack years: 9.00    Types: Cigarettes    Start date: 07/29/1977    Quit date: 06/25/1994    Years since quitting: 28.1   Smokeless tobacco: Never   Tobacco comments:     quit 19 years ago  Vaping Use   Vaping Use: Never used  Substance and Sexual Activity   Alcohol use: Yes    Alcohol/week: 0.0 standard drinks of alcohol    Comment: social drinker   Drug use: No   Sexual activity: Not Currently  Other Topics Concern   Not on file  Social History Narrative   Widowed   Has 2 grown children.  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    Former Smoker Quit tobacco in 1996.  She was a pack a day smoker for approximately 10 years.    Alcohol use-yes: Social    Daily Caffeine Use:6 pack of pepsi daily     Illicit Drug Use - no    Patient does not get regular exercise.       Smoking Status:  quit   Social Determinants of Health   Financial Resource Strain: Low Risk  (06/02/2021)   Overall Financial Resource Strain (CARDIA)    Difficulty of Paying Living Expenses: Not hard at all  Food Insecurity: No Food Insecurity (06/08/2022)   Hunger Vital Sign    Worried About Running Out of Food in the Last Year: Never true    Ran Out of Food in the Last Year: Never true  Transportation Needs: No Transportation Needs (06/08/2022)   PRAPARE - Hydrologist (Medical): No    Lack of Transportation (Non-Medical): No  Physical Activity: Insufficiently Active (06/02/2021)   Exercise Vital Sign    Days of Exercise per Week: 2 days    Minutes of Exercise per Session: 30 min  Stress: No Stress Concern Present (06/02/2021)   [place]    Feeling of Stress : Not at all  Social Connections: Moderately Isolated (06/02/2021)   Social Connection and Isolation Panel [NHANES]    Frequency of Communication with Friends and Family: More than three times a week    Frequency of Social Gatherings with Friends and Family: More than three times a week    Attends Religious Services: More than 4 times per year    Active Member of [employer] or Organizations:  No    Attends Archivist Meetings: Never    Marital Status: Widowed  Intimate Partner Violence: Not At Risk (06/08/2022)   Humiliation, Afraid, Rape, and Kick questionnaire    Fear of Current or Ex-Partner: No    Emotionally Abused: No    Physically Abused: No    Sexually Abused: No   Current Outpatient Medications on File Prior  to Visit  Medication Sig Dispense Refill   albuterol (VENTOLIN HFA) 108 (90 Base) MCG/ACT inhaler INHALE 2 PUFFS BY MOUTH 4 TIMES DAILY 9 g 0   alclomethasone (ACLOVATE) 0.05 % cream Apply topically 2 (two) times daily as needed (Rash). 180 g 3   amLODipine (NORVASC) 10 MG tablet Take 1 tablet by mouth once daily 90 tablet 0   aspirin 81 MG chewable tablet Chew 1 tablet (81 mg total) by mouth daily.     Cholecalciferol (VITAMIN D3) 75 MCG (3000 UT) TABS Take 1 tablet by mouth daily. 30 tablet    clobetasol (TEMOVATE) 0.05 % external solution Apply 1 application topically 2 (two) times daily. 50 mL 6   clotrimazole-betamethasone (LOTRISONE) cream Apply 1 application topically 2 (two) times daily. 30 g 0   Evolocumab (REPATHA SURECLICK) 696 MG/ML SOAJ Inject 1 pen (140 mg) subcutaneously every 14 days 6 mL 3   fluticasone (FLONASE) 50 MCG/ACT nasal spray Place 2 sprays into both nostrils daily. 16 g 1   hyoscyamine (LEVSIN) 0.125 MG tablet Take 1 tablet (0.125 mg total) by mouth every 4 (four) hours as needed for cramping (diarrhea,nausea). 90 tablet 1   imipramine (TOFRANIL) 25 MG tablet Take 50 mg by mouth at bedtime. 60 tablet 1   isosorbide mononitrate (IMDUR) 60 MG 24 hr tablet Take 1.5 tablets (90 mg total) by mouth daily. 135 tablet 3   loratadine (CLARITIN) 10 MG tablet Take 1 tablet (10 mg total) by mouth daily. 10 tablet 0   methimazole (TAPAZOLE) 5 MG tablet Take 1 tablet (5 mg total) by mouth daily. 90 tablet 0   methocarbamol (ROBAXIN) 500 MG tablet Take 1 tablet (500 mg total) by mouth every 8 (eight) hours as needed for muscle spasms. 30  tablet 0   metoCLOPramide (REGLAN) 5 MG tablet TAKE 1 TABLET BY MOUTH THREE TIMES DAILY BEFORE MEAL(S) 90 tablet 0   metoprolol succinate (TOPROL-XL) 100 MG 24 hr tablet Take 1 tablet (100 mg total) by mouth daily. Take with or immediately following a meal. 90 tablet 1   neomycin-polymyxin-hydrocortisone (CORTISPORIN) OTIC solution Place 3 drops into the right ear 4 (four) times daily. 10 mL 0   nitroGLYCERIN (NITROSTAT) 0.4 MG SL tablet Place 1 tablet (0.4 mg total) under the tongue every 5 (five) minutes as needed for chest pain. 25 tablet 3   nystatin (MYCOSTATIN/NYSTOP) powder Apply 1 application topically 2 (two) times daily. Beneath both breasts 60 g 1   ondansetron (ZOFRAN) 4 MG tablet Take 1 tab by mouth every 6 hours as needed for nausea. 100 tablet 1   potassium chloride SA (KLOR-CON M20) 20 MEQ tablet Take 1 tablet (20 mEq total) by mouth daily. 30 tablet 11   rosuvastatin (CRESTOR) 40 MG tablet Take 1 tablet (40 mg total) by mouth daily. 90 tablet 3   SUMAtriptan (IMITREX) 50 MG tablet Take 1 tablet (50 mg total) by mouth every 2 (two) hours as needed for migraine. May repeat in 2 hours if headache persists or recurs. 10 tablet 5   XIIDRA 5 % SOLN Apply 1 drop to eye 2 (two) times daily.     No current facility-administered medications on file prior to visit.   Allergies  Allergen Reactions   Ace Inhibitors Cough   Celebrex [Celecoxib] Other (See Comments)    "makes me bleed"   Diovan [Valsartan]     angioedema   Family History  Problem Relation Age of Onset   Hypertension Mother  Rheum arthritis Mother    Hypertension Father    Diabetes Father    Prostate cancer Father    Kidney disease Father    Diabetes Sister    Fibromyalgia Sister    Diabetes Maternal Grandmother    Lung cancer Maternal Grandfather    Arthritis Brother    Migraines Brother    CAD Brother    Fibromyalgia Sister    Migraines Sister    Colon cancer Neg Hx    Thyroid disease Neg Hx    Colon  polyps Neg Hx    PE: BP 134/80 (BP Location: Left Arm, Patient Position: Sitting, Cuff Size: Normal)   Pulse 61   Ht '5\' 4"'$  (1.626 m)   Wt 232 lb 6.4 oz (105.4 kg)   SpO2 96%   BMI 39.89 kg/m  Wt Readings from Last 3 Encounters:  07/27/22 232 lb 6.4 oz (105.4 kg)  07/25/22 230 lb (104.3 kg)  06/29/22 236 lb (107 kg)   Constitutional: overweight, in NAD Eyes: EOMI, no exophthalmos, no lid lag, no stare ENT: no thyromegaly, no thyroid bruits, no cervical lymphadenopathy Cardiovascular: RRR, No MRG Respiratory: CTA B Musculoskeletal: no deformities Skin: no rashes Neurological: no tremor with outstretched hands, DTR normal in all 4  ASSESSMENT: 1. Thyrotoxicosis  PLAN:  1. Patient with a h/o  low TSH, without thyrotoxic sxs: weight loss, heat intolerance, hyperdefecation, palpitations, anxiety.  - she does not appear to have exogenous causes for the low TSH.  - We discussed that possible causes of thyrotoxicosis are:  Graves ds   Thyroiditis toxic multinodular goiter/ toxic adenoma (I do not feel any nodules or abnormal masses in her neck) - will check the TSH, fT3 and fT4 and also add thyroid stimulating antibodies to screen for Graves' disease.  - If the tests become abnormal again, we may need an uptake and scan to differentiate between the 3 above possible etiologies  - we discussed about possible modalities of treatment for the above conditions, to include methimazole use, radioactive iodine ablation or (last resort) surgery.  As of now, she is doing well on methimazole 5 mg daily, so most likely we will be able to continue this. - she is not tachycardic or tremulous.  She is on metoprolol. - no signs of Graves' ophthalmopathy: she does not have any double vision, blurry vision, eye pain, chemosis. - I advised her to join my chart to communicate easier - RTC in 1 year, but likely sooner for repeat labs  Needs MMI refills.  Component     Latest Ref Rng 07/27/2022  TSH      0.35 - 5.50 uIU/mL 0.97   T4,Free(Direct)     0.60 - 1.60 ng/dL 0.88   Triiodothyronine,Free,Serum     2.3 - 4.2 pg/mL 2.9   TSI     <140 % baseline <89   TFTs and TSI's are normal. Due to the TSH closer to the lower limit of the target range, my suggestion would be to continue methimazole for now, but I would like to check her thyroid tests again in approximately 3 months.  Philemon Kingdom, MD PhD [facility]

## 2022-07-27 NOTE — Patient Instructions (Signed)
Please continue Methimazole 5 mg daily.  Please stop at the lab.  You should have an endocrinology follow-up appointment in 1 year.

## 2022-07-29 NOTE — Procedures (Signed)
   Patient Name: Denise, [name] Date: 07/25/2022 Gender: Female D.O.B: 1961-03-20 Age (years): 61 Referring Provider: [name] Height (inches): 35 Interpreting Physician: Fransico Him MD, ABSM Weight (lbs): 223 RPSGT: Jacolyn Reedy BMI: 38 MRN: [id number] Neck Size: 16.50  CLINICAL INFORMATION Sleep Study Type: HST  Indication for sleep study: Fatigue, Hypertension, Insomnia, Morbid Obesity, Snoring  Epworth Sleepiness Score: N/A  Most recent polysomnogram dated 10/20/2017 revealed an AHI of 6.2/h and RDI of 11.3/h.  SLEEP STUDY TECHNIQUE A multi-channel overnight portable sleep study was performed. The channels recorded were: nasal airflow, thoracic respiratory movement, and oxygen saturation with a pulse oximetry. Snoring was also monitored.  MEDICATIONS Patient self administered medications include: ADVIL PM.  SLEEP ARCHITECTURE Patient was studied for 375 minutes. The sleep efficiency was 100.0 % and the patient was supine for 0%. The arousal index was 0.0 per hour.  RESPIRATORY PARAMETERS The overall AHI was 17.0 per hour, with a central apnea index of 0 per hour.  The oxygen nadir was 82% during sleep.  CARDIAC DATA Mean heart rate during sleep was 60.7 bpm.  IMPRESSIONS - Moderate obstructive sleep apnea occurred during this study (AHI = 17.0/h). - Moderate oxygen desaturation was noted during this study (Min O2 = 82%). - Patient snored 68.7% during the sleep.  DIAGNOSIS - Obstructive Sleep Apnea (G47.33)  RECOMMENDATIONS - Recommend in lab CPAP titration. - Avoid alcohol, sedatives and other CNS depressants that may worsen sleep apnea and disrupt normal sleep architecture. - Sleep hygiene should be reviewed to assess factors that may improve sleep quality. - Weight management and regular exercise should be initiated or continued.  [Electronically signed] 07/29/2022 11:59 AM  Fransico Him MD, ABSM Diplomate, American Board of Sleep  Medicine

## 2022-07-31 LAB — THYROID STIMULATING IMMUNOGLOBULIN: TSI: <89 % baseline (ref <140)

## 2022-07-31 MED ORDER — METHIMAZOLE 5 MG PO TABS: 5.0000 mg | ORAL_TABLET | Freq: Every day | ORAL | 1 refills | Status: DC

## 2022-08-13 ENCOUNTER — Encounter: Payer: Self-pay | Admitting: Family

## 2022-08-14 ENCOUNTER — Emergency Department (HOSPITAL_BASED_OUTPATIENT_CLINIC_OR_DEPARTMENT_OTHER): Payer: 59

## 2022-08-14 ENCOUNTER — Encounter (HOSPITAL_BASED_OUTPATIENT_CLINIC_OR_DEPARTMENT_OTHER): Payer: Self-pay | Admitting: Emergency Medicine

## 2022-08-14 ENCOUNTER — Other Ambulatory Visit: Payer: Self-pay

## 2022-08-14 ENCOUNTER — Encounter: Payer: Self-pay | Admitting: Family

## 2022-08-14 ENCOUNTER — Emergency Department (HOSPITAL_BASED_OUTPATIENT_CLINIC_OR_DEPARTMENT_OTHER)
Admission: EM | Admit: 2022-08-14 | Discharge: 2022-08-14 | Disposition: A | Discharge status: Home / Self Care | Payer: 59 | Attending: Emergency Medicine | Admitting: Emergency Medicine

## 2022-08-14 ENCOUNTER — Ambulatory Visit (INDEPENDENT_AMBULATORY_CARE_PROVIDER_SITE_OTHER): Payer: 59 | Admitting: Family

## 2022-08-14 VITALS — BP 163/81 | HR 68 | Temp 97.9°F | Resp 16 | Wt 234.0 lb

## 2022-08-14 DIAGNOSIS — Z79899 Other long term (current) drug therapy: Secondary | ICD-10-CM | POA: Diagnosis not present

## 2022-08-14 DIAGNOSIS — R0602 Shortness of breath: Secondary | ICD-10-CM | POA: Diagnosis not present

## 2022-08-14 DIAGNOSIS — R0789 Other chest pain: Secondary | ICD-10-CM | POA: Insufficient documentation

## 2022-08-14 DIAGNOSIS — R059 Cough, unspecified: Secondary | ICD-10-CM | POA: Diagnosis not present

## 2022-08-14 DIAGNOSIS — M79602 Pain in left arm: Secondary | ICD-10-CM | POA: Diagnosis not present

## 2022-08-14 DIAGNOSIS — Z7982 Long term (current) use of aspirin: Secondary | ICD-10-CM | POA: Insufficient documentation

## 2022-08-14 DIAGNOSIS — Z1152 Encounter for screening for COVID-19: Secondary | ICD-10-CM | POA: Diagnosis not present

## 2022-08-14 DIAGNOSIS — R079 Chest pain, unspecified: Secondary | ICD-10-CM

## 2022-08-14 DIAGNOSIS — I10 Essential (primary) hypertension: Secondary | ICD-10-CM | POA: Diagnosis not present

## 2022-08-14 LAB — CBC WITH DIFFERENTIAL/PLATELET
Abs Immature Granulocytes: 0.02 10*3/uL (ref 0.00–0.07)
Basophils Absolute: 0 10*3/uL (ref 0.0–0.1)
Basophils Relative: 0 %
Eosinophils Absolute: 0.1 10*3/uL (ref 0.0–0.5)
Eosinophils Relative: 1 %
HCT: 40.1 % (ref 36.0–46.0)
Hemoglobin: 12.1 g/dL (ref 12.0–15.0)
Immature Granulocytes: 0 %
Lymphocytes Relative: 35 %
Lymphs Abs: 3.1 10*3/uL (ref 0.7–4.0)
MCH: 22.7 pg — ABNORMAL LOW (ref 26.0–34.0)
MCHC: 30.2 g/dL (ref 30.0–36.0)
MCV: 75.1 fL — ABNORMAL LOW (ref 80.0–100.0)
Monocytes Absolute: 0.6 10*3/uL (ref 0.1–1.0)
Monocytes Relative: 7 %
Neutro Abs: 4.9 10*3/uL (ref 1.7–7.7)
Neutrophils Relative %: 57 %
Platelets: 263 10*3/uL (ref 150–400)
RBC: 5.34 MIL/uL — ABNORMAL HIGH (ref 3.87–5.11)
RDW: 15.3 % (ref 11.5–15.5)
WBC: 8.7 10*3/uL (ref 4.0–10.5)
nRBC: 0 % (ref 0.0–0.2)

## 2022-08-14 LAB — COMPREHENSIVE METABOLIC PANEL
ALT: 31 U/L (ref 0–44)
AST: 26 U/L (ref 15–41)
Albumin: 4.3 g/dL (ref 3.5–5.0)
Alkaline Phosphatase: 126 U/L (ref 38–126)
Anion gap: 4 — ABNORMAL LOW (ref 5–15)
BUN: 19 mg/dL (ref 8–23)
CO2: 24 mmol/L (ref 22–32)
Calcium: 9 mg/dL (ref 8.9–10.3)
Chloride: 109 mmol/L (ref 98–111)
Creatinine, Ser: 0.9 mg/dL (ref 0.44–1.00)
GFR, Estimated: >60 mL/min (ref >60)
Glucose, Bld: 121 mg/dL — ABNORMAL HIGH (ref 70–99)
Potassium: 3.8 mmol/L (ref 3.5–5.1)
Sodium: 137 mmol/L (ref 135–145)
Total Bilirubin: 0.5 mg/dL (ref 0.3–1.2)
Total Protein: 8.3 g/dL — ABNORMAL HIGH (ref 6.5–8.1)

## 2022-08-14 LAB — TROPONIN I (HIGH SENSITIVITY)
Troponin I (High Sensitivity): 3 ng/L (ref <18)
Troponin I (High Sensitivity): 3 ng/L (ref <18)

## 2022-08-14 LAB — RESP PANEL BY RT-PCR (RSV, FLU A&B, COVID)  RVPGX2
Influenza A by PCR: NEGATIVE
Influenza B by PCR: NEGATIVE
Resp Syncytial Virus by PCR: NEGATIVE
SARS Coronavirus 2 by RT PCR: NEGATIVE

## 2022-08-14 LAB — BRAIN NATRIURETIC PEPTIDE: B Natriuretic Peptide: 11.2 pg/mL (ref 0.0–100.0)

## 2022-08-14 MED ORDER — ALBUTEROL SULFATE HFA 108 (90 BASE) MCG/ACT IN AERS: 2.0000 | INHALATION_SPRAY | RESPIRATORY_TRACT | Status: DC | PRN

## 2022-08-14 NOTE — ED Provider Notes (Signed)
[facility] AT [facility] [place] Provider Note   CSN: [id number] Arrival date & time: 08/14/22  1812     History Chief Complaint  Patient presents with   Shortness of Breath   Arm Pain    [name] is a 62 y.o. female.  Patient with past medical history significant for hypertension hyperlipidemia presents emergency department complaints of shortness of breath and left arm pain.  Patient reports she has been having the sensation for several weeks but this has worsened over the last 3 days.  She denies any cough, fever, abdominal pain, nausea, vomiting.  Patient called her primary care provider earlier today and discussed the symptoms with advised to come into the emergency department for further evaluation.   Shortness of Breath Arm Pain Associated symptoms include shortness of breath.       Home Medications Prior to Admission medications   Medication Sig Start Date End Date Taking? Authorizing Provider  albuterol (VENTOLIN HFA) 108 (90 Base) MCG/ACT inhaler INHALE 2 PUFFS BY MOUTH 4 TIMES DAILY 08/18/21   [name], NP  alclomethasone (ACLOVATE) 0.05 % cream Apply topically 2 (two) times daily as needed (Rash). 01/26/21   Warren Danes, PA-C  amLODipine (NORVASC) 10 MG tablet Take 1 tablet by mouth once daily 07/13/22   [name], NP  aspirin 81 MG chewable tablet Chew 1 tablet (81 mg total) by mouth daily. 07/19/17   Samuella Cota, MD  Cholecalciferol (VITAMIN D3) 75 MCG (3000 UT) TABS Take 1 tablet by mouth daily. 12/01/21   [name], NP  clobetasol (TEMOVATE) 0.05 % external solution Apply 1 application topically 2 (two) times daily. 01/26/21   Sheffield, Ronalee Red, PA-C  clotrimazole-betamethasone (LOTRISONE) cream Apply 1 application topically 2 (two) times daily. 05/01/21   Wendling, Crosby Oyster, DO  Evolocumab (REPATHA SURECLICK) [id number] MG/ML SOAJ Inject 1 pen (140 mg) subcutaneously every 14 days 05/02/22   Fay Records, MD  fluticasone [facility]) 50 MCG/ACT nasal spray Place 2 sprays into both nostrils daily. 01/17/21   Saguier, Percell Miller, PA-C  hyoscyamine (LEVSIN) 0.125 MG tablet Take 1 tablet (0.125 mg total) by mouth every 4 (four) hours as needed for cramping (diarrhea,nausea). 10/13/21   Armbruster, Carlota Raspberry, MD  imipramine (TOFRANIL) 25 MG tablet Take 50 mg by mouth at bedtime. 10/30/21   Armbruster, Carlota Raspberry, MD  isosorbide mononitrate (IMDUR) 60 MG 24 hr tablet Take 1.5 tablets (90 mg total) by mouth daily. 09/04/21   Fay Records, MD  loratadine (CLARITIN) 10 MG tablet Take 1 tablet (10 mg total) by mouth daily. 07/15/20   Carlisle Cater, PA-C  methimazole (TAPAZOLE) 5 MG tablet Take 1 tablet (5 mg total) by mouth daily. 07/31/22   Philemon Kingdom, MD  methocarbamol (ROBAXIN) 500 MG tablet Take 1 tablet (500 mg total) by mouth every 8 (eight) hours as needed for muscle spasms. 09/15/21   [name], NP  metoCLOPramide (REGLAN) 5 MG tablet TAKE 1 TABLET BY MOUTH THREE TIMES DAILY BEFORE MEAL(S) 07/13/22   [name], NP  metoprolol succinate (TOPROL-XL) 100 MG 24 hr tablet Take 1 tablet (100 mg total) by mouth daily. Take with or immediately following a meal. 06/13/22   [name], NP  neomycin-polymyxin-hydrocortisone (CORTISPORIN) OTIC solution Place 3 drops into the right ear 4 (four) times daily. 06/13/22   [name], NP  nitroGLYCERIN (NITROSTAT) 0.4 MG SL tablet Place 1 tablet (0.4 mg total) under the tongue every 5 (five) minutes as needed  for chest pain. 05/27/20   Fay Records, MD  nystatin (MYCOSTATIN/NYSTOP) powder Apply 1 application topically 2 (two) times daily. Beneath both breasts 06/10/20   [name], NP  ondansetron (ZOFRAN) 4 MG tablet Take 1 tab by mouth every 6 hours as needed for nausea. 06/10/20   [name], NP  potassium chloride SA (KLOR-CON M20) 20 MEQ tablet Take 1 tablet (20 mEq total) by mouth daily. 10/02/21   Fay Records,  MD  rosuvastatin (CRESTOR) 40 MG tablet Take 1 tablet (40 mg total) by mouth daily. 12/22/21   [name], NP  SUMAtriptan (IMITREX) 50 MG tablet Take 1 tablet (50 mg total) by mouth every 2 (two) hours as needed for migraine. May repeat in 2 hours if headache persists or recurs. 12/01/21   [name], NP  XIIDRA 5 % SOLN Apply 1 drop to eye 2 (two) times daily. 06/12/22   [provider]      Allergies    Ace inhibitors, Celebrex [celecoxib], and Diovan [valsartan]    Review of Systems   Review of Systems  Respiratory:  Positive for shortness of breath.     Physical Exam Updated Vital Signs BP (!) 173/95 (BP Location: Left Arm)   Pulse 60   Temp 98.1 F (36.7 C) (Oral)   Resp 20   SpO2 98%  Physical Exam Vitals and nursing note reviewed.  Constitutional:      Appearance: She is well-developed.  HENT:     Head: Normocephalic and atraumatic.  Cardiovascular:     Rate and Rhythm: Normal rate and regular rhythm.     Comments: No obvious murmurs or rubs noted  Pulmonary:     Effort: Pulmonary effort is normal.     Breath sounds: Normal breath sounds. No decreased breath sounds, wheezing, rhonchi or rales.  Chest:     Chest wall: Tenderness present. No mass.  Abdominal:     Palpations: Abdomen is soft.  Musculoskeletal:     Cervical back: Normal range of motion.  Skin:    General: Skin is warm and dry.     Capillary Refill: Capillary refill takes less than 2 seconds.     Coloration: Skin is not cyanotic.     Findings: No ecchymosis.  Neurological:     General: No focal deficit present.     Mental Status: She is alert.     ED Results / Procedures / Treatments   Labs (all labs ordered are listed, but only abnormal results are displayed) Labs Reviewed  COMPREHENSIVE METABOLIC PANEL - Abnormal; Notable for the following components:      Result Value   Glucose, Bld 121 (*)    Total Protein 8.3 (*)    Anion gap 4 (*)    All other components  within normal limits  CBC WITH DIFFERENTIAL/PLATELET - Abnormal; Notable for the following components:   RBC 5.34 (*)    MCV 75.1 (*)    MCH 22.7 (*)    All other components within normal limits  RESP PANEL BY RT-PCR (RSV, FLU A&B, COVID)  [id number]  BRAIN NATRIURETIC PEPTIDE  TROPONIN I (HIGH SENSITIVITY)  TROPONIN I (HIGH SENSITIVITY)    EKG EKG Interpretation  Date/Time:  Tuesday August 14 2022 18:24:32 EST Ventricular Rate:  64 PR Interval:  164 QRS Duration: 107 QT Interval:  443 QTC Calculation: 458 R Axis:   -16 Text Interpretation: Sinus rhythm Borderline left axis deviation Low voltage, precordial leads Confirmed by Thayer Jew [phone]) on 08/15/2022  3:57:48 PM  Radiology No results found.  Procedures Procedures   Medications Ordered in ED Medications - No data to display   ED Course/ Medical Decision Making/ A&P                           Medical Decision Making Amount and/or Complexity of Data Reviewed Labs: ordered. Radiology: ordered.  Risk Prescription drug management.   This patient presents to the ED for concern of shortness of breath.  Differential diagnosis includes ACS, pulmonary embolism, pneumothorax, viral URI   Lab Tests:  I Ordered, and personally interpreted labs.  The pertinent results include: Largely normal CBC and CMP, negative BNP, negative troponin, negative respiratory viral panel   Imaging Studies ordered:  I ordered imaging studies including chest x-ray I independently visualized and interpreted imaging which showed no acute cardiopulmonary process I agree with the radiologist interpretation   Medicines ordered and prescription drug management:  I have reviewed the patients home medicines and have made adjustments as needed   Problem List / ED Course:  Patient presents emerged part with complaints of shortness of breath and left arm pain for the last 3 days.  She reports it has been worsening and today's slightly  worse.  Patient has history significant for hypertension.  At this time, patient denies any nausea, vomiting, diarrhea.  She was concerned as this pain feels like it is radiating into her left arm has been intermittently occurring throughout the day.  No prior history of MI.  Physical exam significant for TTP along chest wall on left side.  Lab workable actually reassuring with negative CBC and CMP as well as a negative troponin or BNP no active signs of any viral infection.  Patient's EKG was also reassuring being in normal sinus rhythm without any signs of ST elevation.  Chest x-ray also reassuring no signs of active cardiopulmonary disease.  At this time, this appears to be some sort of atypical or other chest pain causing the symptoms for patient but I feel confident this is not an acute coronary process occurring.  Given patient's history of aortic atherosclerosis, generalized patient should plan follow-up with primary care provider and cardiology for further workup.  Patient agreeable treatment plan at this time and verbalized understanding all return precautions.  All questions answered prior to discharge.   Final Clinical Impression(s) / ED Diagnoses Final diagnoses:  Other chest pain  Chest pain, unspecified type    Rx / DC Orders ED Discharge Orders          Ordered    Ambulatory referral to Cardiology       Comments: If you have not heard from the Cardiology office within the next 72 hours please call [phone].   08/14/22 2120              Luvenia Heller, PA-C 08/17/22 1546    Regan Lemming, MD 08/20/22 [phone]

## 2022-08-14 NOTE — Discharge Instructions (Signed)
You were seen in the ER for shortness of breath and left arm pain. Based on your labs, EKG, and imaging, I do not feel that there is an emergent underlying cause to the discomfort you are feeling. I would recommend following up with the cardiologist for further evaluation given this pain you have been feeling. If your pain worsens or you begin to experience pain with breathing, wheezing, blood in your sputum, please return to the ER for further evaluation. As of now, I do not believe any changes in your medications would be beneficial so no changes have been made.

## 2022-08-14 NOTE — Progress Notes (Signed)
Subjective:   By signing my name below, I, [name], attest that this documentation has been prepared under the direction and in the presence of Denise Pear, NP 08/14/22   Patient ID: [name], female    DOB: 07/21/1960, 62 y.o.   MRN: [id number]  Chief Complaint  Patient presents with   Arm Pain    Complains of left arm pain  with left upper back pain    HPI Patient is in today for an office visit.  Arm ache: She complains of left arm pain, fingertips getting cold and numb accompanied by shortness of breath with minimal exertion and fatigue. Pain radiates through towards her back and lower neck. Symptoms began last week. She reports she has been taking Tylenol to help but it has not been successful.   Ankle edema: She reports that while at her thyroid doctor she noticed a bit of peripheral edema in her ankles.   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/08- benign   Iron deficiency anemia    Migraine headache    Non-compliance    Peptic ulcer disease    Thyroid disease     Past Surgical History:  Procedure Laterality Date   ABDOMINAL EXPLORATION SURGERY     w/bso    BREAST BIOPSY     CARDIAC CATHETERIZATION  2009   mild non obstructive CAD   CHOLECYSTECTOMY     COLONOSCOPY     ESOPHAGEAL MANOMETRY N/A 08/01/2020   Procedure: ESOPHAGEAL MANOMETRY (EM);  Surgeon: Yetta Flock, MD;  Location: [facility];  Service: Gastroenterology;  Laterality: N/A;   KNEE SURGERY  2005    left knee   LEFT HEART  CATH AND CORONARY ANGIOGRAPHY N/A 07/17/2017   Procedure: LEFT HEART CATH AND CORONARY ANGIOGRAPHY;  Surgeon: Martinique, Peter M, MD;  Location: [facility];  Service: Cardiovascular;  Laterality: N/A;   LEFT HEART CATH AND CORONARY ANGIOGRAPHY N/A 09/01/2021   Procedure: LEFT HEART CATH AND CORONARY ANGIOGRAPHY;  Surgeon: Burnell Blanks, MD;  Location: [facility];  Service: Cardiovascular;  Laterality: N/A;   POLYPECTOMY     TOTAL ABDOMINAL HYSTERECTOMY     UPPER GASTROINTESTINAL ENDOSCOPY      Family History  Problem Relation Age of Onset   Hypertension Mother    Rheum arthritis Mother    Hypertension Father    Diabetes Father    Prostate cancer Father    Kidney disease Father    Diabetes Sister    Fibromyalgia Sister    Diabetes Maternal Grandmother    Lung cancer Maternal Grandfather    Arthritis Brother    Migraines Brother    CAD Brother    Fibromyalgia Sister    Migraines Sister    Colon cancer Neg Hx    Thyroid disease Neg Hx    Colon polyps Neg Hx     Social History   Socioeconomic History  Marital status: Widowed    Spouse name: Not on file   Number of children: 2   Years of education: Not on file   Highest education level: Not on file  Occupational History   Occupation: DISABILITY    Employer: UNEMPLOYED  Tobacco Use   Smoking status: Former    Packs/day: 0.50    Years: 18.00    Total pack years: 9.00    Types: Cigarettes    Start date: 07/29/1977    Quit date: 06/25/1994    Years since quitting: 28.1   Smokeless tobacco: Never   Tobacco comments:    quit 19 years ago  Vaping Use   Vaping Use: Never used  Substance and Sexual Activity   Alcohol use: Yes    Alcohol/week: 0.0 standard drinks of alcohol    Comment: social drinker   Drug use: No   Sexual activity: Not Currently  Other Topics Concern   Not on file  Social History Narrative   Widowed   Has 2 grown children.  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    Former Smoker Quit tobacco in 1996.  She was a pack a day smoker for approximately 10 years.    Alcohol use-yes: Social    Daily Caffeine Use:6 pack of pepsi daily     Illicit Drug Use - no    Patient does not get regular exercise.       Smoking Status:  quit   Social Determinants of Health   Financial Resource Strain: Low Risk  (06/02/2021)   Overall Financial Resource Strain (CARDIA)    Difficulty of Paying Living Expenses: Not hard at all  Food Insecurity: No Food Insecurity (06/08/2022)   Hunger Vital Sign    Worried About Running Out of Food in the Last Year: Never true    Ran Out of Food in the Last Year: Never true  Transportation Needs: No Transportation Needs (06/08/2022)   PRAPARE - Hydrologist (Medical): No    Lack of Transportation (Non-Medical): No  Physical Activity: Insufficiently Active (06/02/2021)   Exercise Vital Sign    Days of Exercise per Week: 2 days    Minutes of Exercise per Session: 30 min  Stress: No Stress Concern Present (06/02/2021)   [place]    Feeling of Stress : Not at all  Social Connections: Moderately Isolated (06/02/2021)   Social Connection and Isolation Panel [NHANES]    Frequency of Communication with Friends and Family: More than three times a week    Frequency of Social Gatherings with Friends and Family: More than three times a week    Attends Religious Services: More than 4 times per year    Active Member of [employer] or Organizations: No    Attends Archivist Meetings: Never    Marital Status: Widowed  Intimate Partner Violence: Not At Risk (06/08/2022)   Humiliation, Afraid, Rape, and Kick questionnaire    Fear of Current or Ex-Partner: No    Emotionally Abused: No    Physically Abused: No    Sexually Abused: No    Outpatient Medications Prior to Visit  Medication Sig Dispense Refill   albuterol  (VENTOLIN HFA) 108 (90 Base) MCG/ACT inhaler INHALE 2 PUFFS BY MOUTH 4 TIMES DAILY 9 g 0   alclomethasone (ACLOVATE) 0.05 % cream Apply topically 2 (two) times daily as needed (Rash). 180 g 3  amLODipine (NORVASC) 10 MG tablet Take 1 tablet by mouth once daily 90 tablet 0   aspirin 81 MG chewable tablet Chew 1 tablet (81 mg total) by mouth daily.     Cholecalciferol (VITAMIN D3) 75 MCG (3000 UT) TABS Take 1 tablet by mouth daily. 30 tablet    clobetasol (TEMOVATE) 0.05 % external solution Apply 1 application topically 2 (two) times daily. 50 mL 6   clotrimazole-betamethasone (LOTRISONE) cream Apply 1 application topically 2 (two) times daily. 30 g 0   Evolocumab (REPATHA SURECLICK) [id number] MG/ML SOAJ Inject 1 pen (140 mg) subcutaneously every 14 days 6 mL 3   fluticasone (FLONASE) 50 MCG/ACT nasal spray Place 2 sprays into both nostrils daily. 16 g 1   hyoscyamine (LEVSIN) 0.125 MG tablet Take 1 tablet (0.125 mg total) by mouth every 4 (four) hours as needed for cramping (diarrhea,nausea). 90 tablet 1   imipramine (TOFRANIL) 25 MG tablet Take 50 mg by mouth at bedtime. 60 tablet 1   isosorbide mononitrate (IMDUR) 60 MG 24 hr tablet Take 1.5 tablets (90 mg total) by mouth daily. 135 tablet 3   loratadine (CLARITIN) 10 MG tablet Take 1 tablet (10 mg total) by mouth daily. 10 tablet 0   methimazole (TAPAZOLE) 5 MG tablet Take 1 tablet (5 mg total) by mouth daily. 90 tablet 1   methocarbamol (ROBAXIN) 500 MG tablet Take 1 tablet (500 mg total) by mouth every 8 (eight) hours as needed for muscle spasms. 30 tablet 0   metoCLOPramide (REGLAN) 5 MG tablet TAKE 1 TABLET BY MOUTH THREE TIMES DAILY BEFORE MEAL(S) 90 tablet 0   metoprolol succinate (TOPROL-XL) 100 MG 24 hr tablet Take 1 tablet (100 mg total) by mouth daily. Take with or immediately following a meal. 90 tablet 1   neomycin-polymyxin-hydrocortisone (CORTISPORIN) OTIC solution Place 3 drops into the right ear 4 (four) times daily. 10 mL 0    nitroGLYCERIN (NITROSTAT) 0.4 MG SL tablet Place 1 tablet (0.4 mg total) under the tongue every 5 (five) minutes as needed for chest pain. 25 tablet 3   nystatin (MYCOSTATIN/NYSTOP) powder Apply 1 application topically 2 (two) times daily. Beneath both breasts 60 g 1   ondansetron (ZOFRAN) 4 MG tablet Take 1 tab by mouth every 6 hours as needed for nausea. 100 tablet 1   potassium chloride SA (KLOR-CON M20) 20 MEQ tablet Take 1 tablet (20 mEq total) by mouth daily. 30 tablet 11   rosuvastatin (CRESTOR) 40 MG tablet Take 1 tablet (40 mg total) by mouth daily. 90 tablet 3   SUMAtriptan (IMITREX) 50 MG tablet Take 1 tablet (50 mg total) by mouth every 2 (two) hours as needed for migraine. May repeat in 2 hours if headache persists or recurs. 10 tablet 5   XIIDRA 5 % SOLN Apply 1 drop to eye 2 (two) times daily.     No facility-administered medications prior to visit.    Allergies  Allergen Reactions   Ace Inhibitors Cough   Celebrex [Celecoxib] Other (See Comments)    "makes me bleed"   Diovan [Valsartan]     angioedema    Review of Systems  Constitutional:  Positive for malaise/fatigue.  Respiratory:  Positive for shortness of breath (with minimal exertion).   Cardiovascular:  Positive for leg swelling (ankles).  Musculoskeletal:  Positive for myalgias (arm painful).  Neurological:        Cold and numb fingertips (all digits)       Objective:    Physical Exam Constitutional:  General: She is not in acute distress.    Appearance: Normal appearance. She is well-developed.  HENT:     Head: Normocephalic and atraumatic.     Right Ear: External ear normal.     Left Ear: External ear normal.  Eyes:     General: No scleral icterus. Neck:     Thyroid: No thyromegaly.  Cardiovascular:     Rate and Rhythm: Normal rate and regular rhythm.     Pulses:          Radial pulses are 2+ on the right side and 2+ on the left side.     Heart sounds: Normal heart sounds. No murmur  heard. Pulmonary:     Effort: Pulmonary effort is normal. No respiratory distress.     Breath sounds: Normal breath sounds. No wheezing.  Musculoskeletal:     Cervical back: Neck supple.     Right lower leg: 1+ Edema present.     Left lower leg: 1+ Edema present.     Left ankle: No swelling.  Skin:    General: Skin is warm and dry.  Neurological:     Mental Status: She is alert and oriented to person, place, and time.  Psychiatric:        Mood and Affect: Mood normal.        Behavior: Behavior normal.        Thought Content: Thought content normal.        Judgment: Judgment normal.     BP (!) 163/81 (BP Location: Left Arm)   Pulse 68   Temp 97.9 F (36.6 C) (Oral)   Resp 16   Wt 234 lb (106.1 kg)   SpO2 99%   BMI 40.17 kg/m  Wt Readings from Last 3 Encounters:  08/14/22 234 lb (106.1 kg)  07/27/22 232 lb 6.4 oz (105.4 kg)  07/25/22 230 lb (104.3 kg)       Assessment & Plan:  Left arm pain Assessment & Plan: New, has known history of multivessel non-obstructive CAD.  I am concerned about the left arm pain in conjunction with increased fatigue and increased SOB that there is a cardiac etiology for her left arm pain.  EKG is performed today and personally reviewed.  EKG appears unchanged when compared to EKG from 06/29/2022.  Given symptoms and hx, I am concerned about possible cardiac etiology for her pain. Report was given to ED Physician on duty at the [facility] ED.   Orders: -     EKG 12-Lead   31 minutes spent on today's visit. Time was spent interviewing pt, reviewing chart, and coordinating care with the ED.   I,Rachel Rivera,acting as a Education administrator for Denise Pear, NP.,have documented all relevant documentation on the behalf of Denise Pear, NP,as directed by  Denise Pear, NP while in the presence of Denise Pear, NP.   I, Denise Pear, NP, personally preformed the services described in this documentation.  All medical  record entries made by the scribe were at my direction and in my presence.  I have reviewed the chart and discharge instructions (if applicable) and agree that the record reflects my personal performance and is accurate and complete. 08/14/22   Denise Pear, NP

## 2022-08-14 NOTE — Assessment & Plan Note (Addendum)
New, has known history of multivessel non-obstructive CAD.  I am concerned about the left arm pain in conjunction with increased fatigue and increased SOB that there is a cardiac etiology for her left arm pain.  EKG is performed today and personally reviewed.  EKG appears unchanged when compared to EKG from 06/29/2022.  Given symptoms and hx, I am concerned about possible cardiac etiology for her pain. Report was given to ED Physician on duty at the [facility].

## 2022-08-14 NOTE — ED Triage Notes (Signed)
Intermittent left arm pain and shortness of breath x 3 days , feeling worse today . Denies cough . Hx HTN,  Mid sharp chest pain radiating to left neck . Denies NV

## 2022-08-21 NOTE — Progress Notes (Unsigned)
Office Visit    Patient Name: [name] Date of Encounter: 08/22/2022  PCP:  [name], NP   [facility]  Cardiologist:  [name], MD  Advanced Practice Provider:  No care team member to display Electrophysiologist:  None   HPI    [name] is a 62 y.o. female with a past medical history of chest pain and CAD, esophageal strictures, type 2 diabetes mellitus, endometriosis, GERD, hypertension, hyperlipidemia, thyroid disease presents today for follow-up appointment.  In January 2019 presented with chest pain and vomiting that occurred after outpatient EGD/ dilatation.  Troponin was elevated.  She went on to a cardiac catheterization [place] 19 which showed 75% mid PDA (small vessel), proximal RCA with 25%, OM 50%, LVEF normal.  Focal wall motion abnormality potentially an effective spasm.  She was seen by Dr. Harrington Challenger March of last year and had complaints of chest pain.  Recommended left heart cardiac catheterization.  This showed no significant change from her previous study.  Only distal PDA had significant disease.  She was last seen by Dr. Harrington Challenger January 2024 and was having occasional sharp chest pains which last about a minute.  Described as pleuritic and substernal.  Breathing was okay.  Today, she tells me that she has some discomfort in her chest and it radiates to her left arm and her fingertips get cold.  Nonexertional and happens randomly.  She does have some shortness of breath as well.  She tells me she needs nitroglycerin about once a week.  She remains on 90 mg of Imdur.  She does have a strong family history of CAD in her father and brother as well.  She does endorse early fatigue with activity.  Decreased exercise tolerance.  Her last cardiac catheterization March 2023 was reviewed.  No PCI was indicated at that time.   No orthopnea, PND. Reports no palpitations.    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- benign   Iron deficiency anemia    Migraine headache    Non-compliance    Peptic ulcer disease    Thyroid disease    Past Surgical History:  Procedure Laterality Date   ABDOMINAL EXPLORATION SURGERY     w/bso    BREAST BIOPSY     CARDIAC CATHETERIZATION  2009   mild non obstructive CAD   CHOLECYSTECTOMY     COLONOSCOPY     ESOPHAGEAL MANOMETRY N/A 08/01/2020   Procedure: ESOPHAGEAL MANOMETRY (EM);  Surgeon: Yetta Flock, MD;  Location: [facility];  Service: Gastroenterology;  Laterality: N/A;   KNEE SURGERY  2005    left knee   LEFT HEART CATH AND CORONARY ANGIOGRAPHY N/A 07/17/2017   Procedure: LEFT HEART CATH AND CORONARY ANGIOGRAPHY;  Surgeon: Martinique, Peter M, MD;  Location: [place] [facility];  Service: Cardiovascular;  Laterality: N/A;   LEFT HEART CATH AND  CORONARY ANGIOGRAPHY N/A 09/01/2021   Procedure: LEFT HEART CATH AND CORONARY ANGIOGRAPHY;  Surgeon: Burnell Blanks, MD;  Location: [facility];  Service: Cardiovascular;  Laterality: N/A;   POLYPECTOMY     TOTAL ABDOMINAL HYSTERECTOMY     UPPER GASTROINTESTINAL ENDOSCOPY      Allergies  Allergies  Allergen Reactions   Ace Inhibitors Cough   Celebrex [Celecoxib] Other (See Comments)    "makes me bleed"   Diovan [Valsartan]     angioedema    EKGs/Labs/Other Studies Reviewed:   The following studies were reviewed today: Echo  June 2023   Left ventricular ejection fraction, by estimation, is 60 to 65%. Left ventricular ejection fraction  by 3D volume is 61 %. The left ventricle has normal function. The left ventricle has no regional wall motion abnormalities. Left ventricular diastolic parameters are consistent with Grade I diastolic dysfunction (impaired relaxation). 1. Right ventricular systolic function is mildly reduced. The right ventricular size is normal. 2. The mitral valve is normal in structure. No evidence of mitral valve regurgitation. No evidence of mitral stenosis. 3. The aortic valve is normal in structure. There is severe calcifcation of the aortic valve. There is moderate thickening of the aortic valve. Aortic valve regurgitation is mild to moderate. Aortic valve sclerosis/calcification is present, without any evidence of aortic stenosis. 4. The inferior vena cava is dilated in size with >50% respiratory variability, suggesting right atrial pressure of 8 mmHg.   March  2023 [facility]    Prox RCA to Mid RCA lesion is 25% stenosed.   Ost Ramus to Ramus lesion is 50% stenosed.   Dist RCA lesion is 40% stenosed.   RPDA lesion is 90% stenosed.   Prox Cx to Mid Cx lesion is 30% stenosed.   2nd Mrg lesion is 50% stenosed.   Mid LAD lesion is 30% stenosed.   The left ventricular systolic function is normal.   LV end diastolic pressure is normal.   The left ventricular ejection fraction is 55-65% by visual estimate.   There is no mitral valve regurgitation.   The LAD has mild non-obstructive disease The Circumflex has mild non-obstructive disease The intermediate branch is a moderate caliber vessel with moderate stenosis, unchanged from last cath in 2019 The RCA is a large dominant vessel with mild diffuse calcific disease in the proximal, mid and distal vessel. The small caliber PDA has severe stenosis, overall unchanged from cath in 2019. The PDA is too small for PCI.  Normal LV systolic function. Normal LVEDP.    Recommendations: The PDA is too small for PCI and is unchanged from cath in 2019. Her diffuse  mild to moderate CAD is overall unchanged. Continue medical management of CAD.  EKG:  EKG is  ordered today.  The ekg ordered today demonstrates sinus bradycardia, rate 56 bpm  Recent Labs: 07/27/2022: TSH 0.97 08/14/2022: ALT 31; B Natriuretic Peptide 11.2; BUN 19; Creatinine, Ser 0.90; Hemoglobin 12.1; Platelets 263; Potassium 3.8; Sodium 137  Recent Lipid Panel    Component Value Date/Time   CHOL 114 12/01/2021 0900   CHOL 92 (L) 11/24/2021 0737   TRIG 131.0 12/01/2021 0900   TRIG 103 04/30/2006 0949   HDL 50.10 12/01/2021 0900   HDL 54 11/24/2021 0737   CHOLHDL 2 12/01/2021 0900   VLDL 26.2 12/01/2021 0900   LDLCALC 38 12/01/2021 0900   LDLCALC 21 11/24/2021 0737   LDLDIRECT 76 07/21/2019 0756   LDLDIRECT 131.0 10/31/2016 1555    Home Medications  Current Meds  Medication Sig   albuterol (VENTOLIN HFA) 108 (90 Base) MCG/ACT inhaler INHALE 2 PUFFS BY MOUTH 4 TIMES DAILY   alclomethasone (ACLOVATE) 0.05 % cream Apply topically 2 (two) times daily as needed (Rash).   amLODipine (NORVASC) 10 MG tablet Take 1 tablet by mouth once daily   aspirin 81 MG chewable tablet Chew 1 tablet (81 mg total) by mouth daily.   Cholecalciferol (VITAMIN D3) 75 MCG (3000 UT) TABS Take 1 tablet by mouth daily.   clobetasol (TEMOVATE) 0.05 % external solution Apply 1 application topically 2 (two) times daily.   clotrimazole-betamethasone (LOTRISONE) cream Apply 1 application topically 2 (two) times daily.   cyclobenzaprine (FLEXERIL) 5 MG tablet 5 mg 3 (three) times daily as needed.   Evolocumab (REPATHA SURECLICK) [id number] MG/ML SOAJ Inject 1 pen (140 mg) subcutaneously every 14 days   fluticasone (FLONASE) 50 MCG/ACT nasal spray Place 2 sprays into both nostrils daily.   furosemide (LASIX) 20 MG tablet Take 1 tablet (20 mg total) by mouth daily as needed (for shortness of breath or weight gain of 2 lbs overnight or 5 lbs in a week).   hyoscyamine (LEVSIN) 0.125 MG tablet Take 1 tablet (0.125 mg total)  by mouth every 4 (four) hours as needed for cramping (diarrhea,nausea).   imipramine (TOFRANIL) 25 MG tablet Take 50 mg by mouth at bedtime.   isosorbide mononitrate (IMDUR) 60 MG 24 hr tablet Take 1.5 tablets (90 mg total) by mouth daily.   loratadine (CLARITIN) 10 MG tablet Take 1 tablet (10 mg total) by mouth daily.   methimazole (TAPAZOLE) 5 MG tablet Take 1 tablet (5 mg total) by mouth daily.   methocarbamol (ROBAXIN) 500 MG tablet Take 1 tablet (500 mg total) by mouth every 8 (eight) hours as needed for muscle spasms.   metoCLOPramide (REGLAN) 5 MG tablet TAKE 1 TABLET BY MOUTH THREE TIMES DAILY BEFORE MEAL(S)   metoprolol succinate (TOPROL-XL) 100 MG 24 hr tablet Take 1 tablet (100 mg total) by mouth daily. Take with or immediately following a meal.   neomycin-polymyxin-hydrocortisone (CORTISPORIN) OTIC solution Place 3 drops into the right ear 4 (four) times daily.   nitroGLYCERIN (NITROSTAT) 0.4 MG SL tablet Place 1 tablet (0.4 mg total) under the tongue every 5 (five) minutes as needed for chest pain.   nystatin (MYCOSTATIN/NYSTOP) powder Apply 1 application topically 2 (two) times daily. Beneath both breasts   ondansetron (ZOFRAN) 4 MG tablet Take 1 tab by mouth every 6 hours as needed for nausea.   ondansetron (ZOFRAN-ODT) 8 MG disintegrating tablet Take 8 mg by mouth every 8 (eight) hours as needed for vomiting or nausea.   potassium chloride SA (KLOR-CON M20) 20 MEQ tablet Take 1 tablet (20 mEq total) by mouth daily.   rosuvastatin (CRESTOR) 40 MG tablet Take 1 tablet (40 mg total) by mouth daily.   SUMAtriptan (IMITREX) 50 MG tablet Take 1 tablet (50 mg total) by mouth every 2 (two) hours as needed for migraine. May repeat in 2 hours if headache persists or recurs.   traMADol (ULTRAM) 50 MG tablet Take 50 mg by mouth as needed for moderate pain.   valsartan-hydrochlorothiazide (DIOVAN HCT) 160-12.5 MG tablet Take 1 tablet by mouth daily.   XIIDRA 5 % SOLN Apply 1 drop to eye 2 (two)  times daily.     Review of Systems      All other systems reviewed and are otherwise negative except as noted above.  Physical Exam    VS:  BP 124/72   Pulse (!) 56   Ht '5\' 4"'$  (1.626 m)   Wt 234 lb 3.2 oz (106.2 kg)   SpO2 97%   BMI 40.20 kg/m  , BMI Body mass index is 40.2 kg/m.  Wt Readings from Last 3 Encounters:  08/22/22 234 lb 3.2 oz (106.2 kg)  08/14/22 234 lb (106.1 kg)  07/27/22 232 lb 6.4 oz (105.4 kg)     GEN: Well nourished, well developed, in no acute distress. HEENT: normal. Neck: Supple, no JVD, carotid bruits, or masses. Cardiac: RRR, no murmurs, rubs, or gallops. No clubbing, cyanosis, 1+ LE edema.  Radials/PT 2+ and equal bilaterally.  Respiratory:  Respirations regular and unlabored, clear to auscultation bilaterally. GI: Soft, nontender, nondistended. MS: No deformity or atrophy. Skin: Warm and dry, no rash. Neuro:  Strength and sensation are intact. Psych: Normal affect.  Assessment & Plan    CAD -she is having decreased exercise tolerance as well as chest pain radiating to her left arm. Some SOB as well with LE edema  -We will plan for coronary CTA and echocardiogram for further workup -She can continue her current medications which include aspirin 81 mg, amlodipine 10 mg daily, Imdur 90 mg daily, metoprolol succinate 100 mg daily, nitro as needed, Crestor 40 mg daily, and Repatha 140 mg/mL every 14 days  Chest pain, atypical -Nonexertional however, with her family history and previous history of CAD we have decided to pursue an ischemic workup  Hypertension -Well-controlled today -Continue to monitor blood pressure at home -Can continue current medication regimen  Hyperlipidemia -Continue Crestor and Repatha -LDL 38 (11/2021) -Triglycerides 131 -Will be due for repeat lipid panel 11/2022  Obesity/fluid overload -Additional risk factor for metabolic syndrome -Encouraged low-sodium diet and monitoring weight daily -Added Lasix 20 mg daily  as needed for weight greater than 2 pounds overnight or 5 pounds in a week    Disposition: Follow up 2 months with [name], MD or APP.  Signed, Elgie Collard, PA-C 08/22/2022, 10:39 AM [place]

## 2022-08-22 ENCOUNTER — Encounter: Payer: Self-pay | Admitting: Physician Assistant

## 2022-08-22 ENCOUNTER — Ambulatory Visit: Payer: 59 | Attending: Physician Assistant | Admitting: Physician Assistant

## 2022-08-22 ENCOUNTER — Encounter: Payer: Self-pay | Admitting: Family

## 2022-08-22 VITALS — BP 124/72 | HR 56 | Ht 64.0 in | Wt 234.2 lb

## 2022-08-22 DIAGNOSIS — I10 Essential (primary) hypertension: Secondary | ICD-10-CM

## 2022-08-22 DIAGNOSIS — I2510 Atherosclerotic heart disease of native coronary artery without angina pectoris: Secondary | ICD-10-CM

## 2022-08-22 DIAGNOSIS — E785 Hyperlipidemia, unspecified: Secondary | ICD-10-CM | POA: Diagnosis not present

## 2022-08-22 DIAGNOSIS — R072 Precordial pain: Secondary | ICD-10-CM

## 2022-08-22 DIAGNOSIS — R0602 Shortness of breath: Secondary | ICD-10-CM

## 2022-08-22 MED ORDER — FUROSEMIDE 20 MG PO TABS: 20.0000 mg | ORAL_TABLET | Freq: Every day | ORAL | 3 refills | Status: AC | PRN

## 2022-08-22 NOTE — Patient Instructions (Addendum)
Medication Instructions:  1.Start furosemide (Lasix) 20 mg as needed for shortness of breath or weight gain of 2 lbs overnight or 5 lbs in a week *If you need a refill on your cardiac medications before your next appointment, please call your pharmacy*   Lab Work: None If you have labs (blood work) drawn today and your tests are completely normal, you will receive your results only by: [place] (if you have MyChart) OR A paper copy in the mail If you have any lab test that is abnormal or we need to change your treatment, we will call you to review the results.   Testing/Procedures: Your physician has requested that you have an echocardiogram. Echocardiography is a painless test that uses sound waves to create images of your heart. It provides your doctor with information about the size and shape of your heart and how well your heart's chambers and valves are working. This procedure takes approximately one hour. There are no restrictions for this procedure. Please do NOT wear cologne, perfume, aftershave, or lotions (deodorant is allowed). Please arrive 15 minutes prior to your appointment time.     Your cardiac CT will be scheduled at one of the below locations:   [facility] [address] [place], [place] [address] (336) [place] [address] [place], [place] [address] [phone]  [facility] [place], [place] [address] [phone]  If scheduled at [facility], please arrive at the [facility] En[facility] (Entrance C2) of [facility] 30 minutes prior to test start time. You can use the FREE valet parking offered at entrance C (encouraged to control the heart rate for the test)  Proceed to the [facility] Radiology Department (first floor) to check-in and test prep.  All radiology patients and guests should use entrance  C2 at [facility], accessed from [facility], even though the hospital's physical address listed is [address].    If scheduled at [facility] or [facility], please arrive 15 mins early for check-in and test prep.   Please follow these instructions carefully (unless otherwise directed):  Hold all erectile dysfunction medications at least 3 days (72 hrs) prior to test. (Ie viagra, cialis, sildenafil, tadalafil, etc) We will administer nitroglycerin during this exam.   On the Night Before the Test: Be sure to Drink plenty of water. Do not consume any caffeinated/decaffeinated beverages or chocolate 12 hours prior to your test. Do not take any antihistamines 12 hours prior to your test. If the patient has contrast allergy: Patient will need a prescription for Prednisone and very clear instructions (as follows): Prednisone 50 mg - take 13 hours prior to test Take another Prednisone 50 mg 7 hours prior to test Take another Prednisone 50 mg 1 hour prior to test Take Benadryl 50 mg 1 hour prior to test Patient must complete all four doses of above prophylactic medications. Patient will need a ride after test due to Benadryl.  On the Day of the Test: Drink plenty of water until 1 hour prior to the test. Do not eat any food 1 hour prior to test. You may take your regular medications prior to the test.  If you take Furosemide/Hydrochlorothiazide/Spironolactone, please HOLD on the morning of the test. FEMALES- please wear underwire-free bra if available, avoid dresses & tight clothing  After the Test:  Drink plenty of water. After receiving IV contrast, you may experience a mild flushed feeling. This is normal. On occasion, you may experience a mild rash up to 24 hours after the test. This is not dangerous. If this occurs, you can take Benadryl 25 mg and increase your fluid intake. If you experience trouble  breathing, this can be serious. If it is severe call 911 IMMEDIATELY. If it is mild, please call our office. If you take any of these medications: Glipizide/Metformin, Avandament, Glucavance, please do not take 48 hours after completing test unless otherwise instructed.  We will call to schedule your test 2-4 weeks out understanding that some insurance companies will need an authorization prior to the service being performed.   For non-scheduling related questions, please contact the cardiac imaging nurse navigator should you have any questions/concerns: Marchia Bond, Cardiac Imaging Nurse Navigator Gordy Clement, Cardiac Imaging Nurse Navigator [facility] Direct Office Dial: [phone]   For scheduling needs, including cancellations and rescheduling, please call [place], [phone].    Follow-Up: At [facility], you and your health needs are our priority.  As part of our continuing mission to provide you with exceptional heart care, we have created designated Provider Care Teams.  These Care Teams include your primary Cardiologist (physician) and Advanced Practice Providers (APPs -  Physician Assistants and Nurse Practitioners) who all work together to provide you with the care you need, when you need it.  We recommend signing up for the patient portal called "MyChart".  Sign up information is provided on this After Visit Summary.  MyChart is used to connect with patients for Virtual Visits (Telemedicine).  Patients are able to view lab/test results, encounter notes, upcoming appointments, etc.  Non-urgent messages can be sent to your provider as well.   To learn more about what you can do with MyChart, go to [email or web address].    Your next appointment:   2 month(s)  Provider:   [name], MD  or Nicholes Rough, PA-C

## 2022-08-27 ENCOUNTER — Encounter: Payer: Self-pay | Admitting: Family

## 2022-08-30 ENCOUNTER — Other Ambulatory Visit: Payer: Self-pay | Admitting: Cardiology

## 2022-08-30 ENCOUNTER — Ambulatory Visit (HOSPITAL_COMMUNITY)
Admission: RE | Admit: 2022-08-30 | Discharge: 2022-08-30 | Disposition: A | Discharge status: Home / Self Care | Payer: 59 | Source: Ambulatory Visit | Attending: Physician Assistant | Admitting: Physician Assistant

## 2022-08-30 DIAGNOSIS — R072 Precordial pain: Secondary | ICD-10-CM | POA: Insufficient documentation

## 2022-08-30 DIAGNOSIS — R931 Abnormal findings on diagnostic imaging of heart and coronary circulation: Secondary | ICD-10-CM

## 2022-08-30 DIAGNOSIS — I2510 Atherosclerotic heart disease of native coronary artery without angina pectoris: Secondary | ICD-10-CM | POA: Diagnosis not present

## 2022-08-30 MED ORDER — NITROGLYCERIN 0.4 MG SL SUBL
0.8000 mg | SUBLINGUAL_TABLET | Freq: Once | SUBLINGUAL | Status: AC
  Administered 2022-08-30: 0.8 mg via SUBLINGUAL

## 2022-08-30 MED ORDER — NITROGLYCERIN 0.4 MG SL SUBL
SUBLINGUAL_TABLET | SUBLINGUAL | Status: AC
  Filled 2022-08-30: qty 2

## 2022-08-30 MED ORDER — NITROGLYCERIN 0.4 MG SL SUBL: 0.4000 mg | SUBLINGUAL_TABLET | SUBLINGUAL | Status: DC | PRN

## 2022-08-30 MED ORDER — IOHEXOL 350 MG/ML SOLN
95.0000 mL | Freq: Once | INTRAVENOUS | Status: AC | PRN
  Administered 2022-08-30: 95 mL via INTRAVENOUS

## 2022-08-31 ENCOUNTER — Telehealth: Payer: Self-pay | Admitting: *Deleted

## 2022-08-31 DIAGNOSIS — I2510 Atherosclerotic heart disease of native coronary artery without angina pectoris: Secondary | ICD-10-CM

## 2022-08-31 DIAGNOSIS — G4733 Obstructive sleep apnea (adult) (pediatric): Secondary | ICD-10-CM

## 2022-08-31 DIAGNOSIS — R0683 Snoring: Secondary | ICD-10-CM

## 2022-08-31 DIAGNOSIS — I10 Essential (primary) hypertension: Secondary | ICD-10-CM

## 2022-08-31 NOTE — Telephone Encounter (Signed)
The patient has been notified of the result and verbalized understanding.  All questions (if any) were answered. Marolyn Hammock, [place] [id number] PM    Precert titration

## 2022-08-31 NOTE — Telephone Encounter (Signed)
-----   Message from Lauralee Evener, [place] sent at 07/30/2022 11:32 AM EST -----  ----- Message ----- From: Sueanne Margarita, MD Sent: 07/29/2022  12:02 PM EST To: Cv Div Sleep Studies  Please let patient know that they have sleep apnea.  Recommend therapeutic CPAP titration for treatment of patient's sleep disordered breathing.  If unable to perform an in lab titration then initiate ResMed auto CPAP from 4 to 15cm H2O with heated humidity and mask of choice and overnight pulse ox on CPAP.

## 2022-09-07 ENCOUNTER — Other Ambulatory Visit: Payer: Self-pay | Admitting: Internal Medicine

## 2022-09-17 ENCOUNTER — Telehealth: Payer: Self-pay | Admitting: Family

## 2022-09-17 NOTE — Telephone Encounter (Signed)
Pt called stating that she has a boil on her eye and was wondering if Melissa could call in what she used to treat it last time.

## 2022-09-18 NOTE — Telephone Encounter (Signed)
I am not sure what I gave her previously looking back in her chart- but usually the best treatment for a stye is warm compresses several times a day. If this does not help or if symptoms worsen, please schedule an office visit.

## 2022-09-18 NOTE — Telephone Encounter (Signed)
Patient reports she is doing hot compresses but stye continues to grow. Was schedule to come in 09/20/22

## 2022-09-20 ENCOUNTER — Other Ambulatory Visit (HOSPITAL_BASED_OUTPATIENT_CLINIC_OR_DEPARTMENT_OTHER): Payer: Self-pay

## 2022-09-20 ENCOUNTER — Ambulatory Visit (INDEPENDENT_AMBULATORY_CARE_PROVIDER_SITE_OTHER): Payer: 59 | Admitting: Family

## 2022-09-20 ENCOUNTER — Encounter: Payer: Self-pay | Admitting: Family

## 2022-09-20 VITALS — BP 130/72 | HR 73 | Temp 98.5°F | Resp 16 | Wt 233.0 lb

## 2022-09-20 DIAGNOSIS — L0201 Cutaneous abscess of face: Secondary | ICD-10-CM

## 2022-09-20 MED ORDER — DOXYCYCLINE HYCLATE 100 MG PO TABS: 100.0000 mg | ORAL_TABLET | Freq: Two times a day (BID) | ORAL | 0 refills | Status: DC

## 2022-09-20 MED ORDER — DOXYCYCLINE HYCLATE 100 MG PO TABS
100.0000 mg | ORAL_TABLET | Freq: Two times a day (BID) | ORAL | 0 refills | Status: DC
  Filled 2022-09-20: qty 14, 7d supply, fill #0

## 2022-09-20 NOTE — Assessment & Plan Note (Signed)
She had a similar issue last year.  Recommended that she begin doxycycline.  She is advised to continue to apply warm compresses.  Monitor for worsening swelling.  If increased swelling/pain over the weekend, she is advised to go to the ER.  Pt verbalizes understanding.

## 2022-09-20 NOTE — Progress Notes (Signed)
Subjective:     Patient ID: [name], female    DOB: 1961-04-16, 62 y.o.   MRN: [id number]  Chief Complaint  Patient presents with   Abscess    Patient comes in for abscess on left side of face. "This has been getting bigger in the last 2 days and it hurts".     Abscess   Patient is in today with complaint of abscess on her face.    Health Maintenance Due  Topic Date Due   OPHTHALMOLOGY EXAM  Never done   FOOT EXAM  05/30/2013   DTaP/Tdap/Td (3 - Td or Tdap) 04/29/2021   COVID-19 Vaccine (5 - 2023-24 season) 02/23/2022   HEMOGLOBIN A1C  08/06/2022    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/08- benign   Iron deficiency anemia    Migraine headache    Non-compliance    Peptic ulcer disease    Thyroid disease     Past Surgical History:  Procedure Laterality Date   ABDOMINAL EXPLORATION SURGERY     w/bso    BREAST BIOPSY     CARDIAC CATHETERIZATION  2009   mild non obstructive CAD   CHOLECYSTECTOMY     COLONOSCOPY     ESOPHAGEAL MANOMETRY N/A 08/01/2020   Procedure: ESOPHAGEAL MANOMETRY (EM);  Surgeon: Yetta Flock, MD;  Location: [facility];  Service: Gastroenterology;  Laterality: N/A;   KNEE SURGERY  2005    left knee   LEFT HEART CATH AND CORONARY ANGIOGRAPHY N/A 07/17/2017   Procedure: LEFT HEART CATH AND CORONARY ANGIOGRAPHY;  Surgeon: Martinique, Peter M, MD;  Location: [facility];  Service: Cardiovascular;  Laterality: N/A;   LEFT HEART CATH AND CORONARY ANGIOGRAPHY N/A 09/01/2021    Procedure: LEFT HEART CATH AND CORONARY ANGIOGRAPHY;  Surgeon: Burnell Blanks, MD;  Location: [facility];  Service: Cardiovascular;  Laterality: N/A;   POLYPECTOMY     TOTAL ABDOMINAL HYSTERECTOMY     UPPER GASTROINTESTINAL ENDOSCOPY      Family History  Problem Relation Age of Onset   Hypertension Mother    Rheum arthritis Mother    Hypertension Father    Diabetes Father    Prostate cancer Father    Kidney disease Father    Diabetes Sister    Fibromyalgia Sister    Diabetes Maternal Grandmother    Lung cancer Maternal Grandfather    Arthritis Brother    Migraines Brother    CAD Brother    Fibromyalgia Sister    Migraines Sister    Colon cancer Neg Hx    Thyroid disease Neg Hx    Colon polyps Neg Hx     Social History   Socioeconomic History   Marital status: Widowed    Spouse name: Not on file   Number of children: 2   Years of education: Not on file   Highest education level: Not on file  Occupational  History   Occupation: DISABILITY    Employer: UNEMPLOYED  Tobacco Use   Smoking status: Former    Packs/day: 0.50    Years: 18.00    Additional pack years: 0.00    Total pack years: 9.00    Types: Cigarettes    Start date: 07/29/1977    Quit date: 06/25/1994    Years since quitting: 28.2   Smokeless tobacco: Never   Tobacco comments:    quit 19 years ago  Vaping Use   Vaping Use: Never used  Substance and Sexual Activity   Alcohol use: Yes    Alcohol/week: 0.0 standard drinks of alcohol    Comment: social drinker   Drug use: No   Sexual activity: Not Currently  Other Topics Concern   Not on file  Social History Narrative   Widowed   Has 2 grown children.  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    Former Smoker Quit tobacco in 1996.  She was a pack a day smoker for approximately 10 years.    Alcohol use-yes: Social    Daily Caffeine Use:6 pack of pepsi daily     Illicit Drug Use - no    Patient  does not get regular exercise.       Smoking Status:  quit   Social Determinants of Health   Financial Resource Strain: Low Risk  (06/02/2021)   Overall Financial Resource Strain (CARDIA)    Difficulty of Paying Living Expenses: Not hard at all  Food Insecurity: No Food Insecurity (06/08/2022)   Hunger Vital Sign    Worried About Running Out of Food in the Last Year: Never true    Ran Out of Food in the Last Year: Never true  Transportation Needs: No Transportation Needs (06/08/2022)   PRAPARE - Hydrologist (Medical): No    Lack of Transportation (Non-Medical): No  Physical Activity: Insufficiently Active (06/02/2021)   Exercise Vital Sign    Days of Exercise per Week: 2 days    Minutes of Exercise per Session: 30 min  Stress: No Stress Concern Present (06/02/2021)   [place]    Feeling of Stress : Not at all  Social Connections: Moderately Isolated (06/02/2021)   Social Connection and Isolation Panel [NHANES]    Frequency of Communication with Friends and Family: More than three times a week    Frequency of Social Gatherings with Friends and Family: More than three times a week    Attends Religious Services: More than 4 times per year    Active Member of [employer] or Organizations: No    Attends Archivist Meetings: Never    Marital Status: Widowed  Intimate Partner Violence: Not At Risk (06/08/2022)   Humiliation, Afraid, Rape, and Kick questionnaire    Fear of Current or Ex-Partner: No    Emotionally Abused: No    Physically Abused: No    Sexually Abused: No    Outpatient Medications Prior to Visit  Medication Sig Dispense Refill   albuterol (VENTOLIN HFA) 108 (90 Base) MCG/ACT inhaler INHALE 2 PUFFS BY MOUTH 4 TIMES DAILY 9 g 0   alclomethasone (ACLOVATE) 0.05 % cream Apply topically 2 (two) times daily as needed (Rash). 180 g 3   amLODipine (NORVASC) 10 MG tablet Take 1  tablet by mouth once daily 90 tablet 0   aspirin 81 MG chewable tablet Chew 1 tablet (81 mg  total) by mouth daily.     Cholecalciferol (VITAMIN D3) 75 MCG (3000 UT) TABS Take 1 tablet by mouth daily. 30 tablet    clobetasol (TEMOVATE) 0.05 % external solution Apply 1 application topically 2 (two) times daily. 50 mL 6   clotrimazole-betamethasone (LOTRISONE) cream Apply 1 application topically 2 (two) times daily. 30 g 0   cyclobenzaprine (FLEXERIL) 5 MG tablet 5 mg 3 (three) times daily as needed.     Evolocumab (REPATHA SURECLICK) [id number] MG/ML SOAJ Inject 1 pen (140 mg) subcutaneously every 14 days 6 mL 3   fluticasone (FLONASE) 50 MCG/ACT nasal spray Place 2 sprays into both nostrils daily. 16 g 1   furosemide (LASIX) 20 MG tablet Take 1 tablet (20 mg total) by mouth daily as needed (for shortness of breath or weight gain of 2 lbs overnight or 5 lbs in a week). 45 tablet 3   hyoscyamine (LEVSIN) 0.125 MG tablet Take 1 tablet (0.125 mg total) by mouth every 4 (four) hours as needed for cramping (diarrhea,nausea). 90 tablet 1   imipramine (TOFRANIL) 25 MG tablet Take 50 mg by mouth at bedtime. 60 tablet 1   isosorbide mononitrate (IMDUR) 60 MG 24 hr tablet Take 1.5 tablets (90 mg total) by mouth daily. 135 tablet 3   loratadine (CLARITIN) 10 MG tablet Take 1 tablet (10 mg total) by mouth daily. 10 tablet 0   methimazole (TAPAZOLE) 5 MG tablet Take 1 tablet (5 mg total) by mouth daily. 90 tablet 1   methocarbamol (ROBAXIN) 500 MG tablet Take 1 tablet (500 mg total) by mouth every 8 (eight) hours as needed for muscle spasms. 30 tablet 0   metoCLOPramide (REGLAN) 5 MG tablet TAKE 1 TABLET BY MOUTH THREE TIMES DAILY BEFORE MEAL(S) 90 tablet 0   metoprolol succinate (TOPROL-XL) 100 MG 24 hr tablet Take 1 tablet (100 mg total) by mouth daily. Take with or immediately following a meal. 90 tablet 1   neomycin-polymyxin-hydrocortisone (CORTISPORIN) OTIC solution Place 3 drops into the right ear 4 (four) times  daily. 10 mL 0   nitroGLYCERIN (NITROSTAT) 0.4 MG SL tablet DISSOLVE ONE TABLET UNDER THE TONGUE EVERY 5 MINUTES AS NEEDED FOR CHEST PAIN.  DO NOT EXCEED A TOTAL OF 3 DOSES IN 15 MINUTES 90 tablet 3   nystatin (MYCOSTATIN/NYSTOP) powder Apply 1 application topically 2 (two) times daily. Beneath both breasts 60 g 1   ondansetron (ZOFRAN) 4 MG tablet Take 1 tab by mouth every 6 hours as needed for nausea. 100 tablet 1   ondansetron (ZOFRAN-ODT) 8 MG disintegrating tablet Take 8 mg by mouth every 8 (eight) hours as needed for vomiting or nausea.     potassium chloride SA (KLOR-CON M20) 20 MEQ tablet Take 1 tablet (20 mEq total) by mouth daily. 30 tablet 11   rosuvastatin (CRESTOR) 40 MG tablet Take 1 tablet (40 mg total) by mouth daily. 90 tablet 3   SUMAtriptan (IMITREX) 50 MG tablet Take 1 tablet (50 mg total) by mouth every 2 (two) hours as needed for migraine. May repeat in 2 hours if headache persists or recurs. 10 tablet 5   traMADol (ULTRAM) 50 MG tablet Take 50 mg by mouth as needed for moderate pain.     valsartan-hydrochlorothiazide (DIOVAN HCT) 160-12.5 MG tablet Take 1 tablet by mouth daily.     XIIDRA 5 % SOLN Apply 1 drop to eye 2 (two) times daily.     No facility-administered medications prior to visit.    Allergies  Allergen  Reactions   Ace Inhibitors Cough   Celebrex [Celecoxib] Other (See Comments)    "makes me bleed"   Diovan [Valsartan]     angioedema    ROS See HPI    Objective:    Physical Exam Constitutional:      Appearance: Normal appearance.  Eyes:     Extraocular Movements: Extraocular movements intact.  Cardiovascular:     Rate and Rhythm: Normal rate.  Pulmonary:     Effort: Pulmonary effort is normal.  Skin:    General: Skin is warm and dry.     Comments: Firm tender abscess noted left cheek, no appreciable induration. Has some surrounding erythema/swelling  Neurological:     Mental Status: She is alert.      BP 130/72 (BP Location: Right  Arm, Patient Position: Sitting, Cuff Size: Large)   Pulse 73   Temp 98.5 F (36.9 C) (Oral)   Resp 16   Wt 233 lb (105.7 kg)   SpO2 99%   BMI 39.99 kg/m  Wt Readings from Last 3 Encounters:  09/20/22 233 lb (105.7 kg)  08/22/22 234 lb 3.2 oz (106.2 kg)  08/14/22 234 lb (106.1 kg)       Assessment & Plan:   Problem List Items Addressed This Visit       Unprioritized   Facial abscess - Primary    She had a similar issue last year.  Recommended that she begin doxycycline.  She is advised to continue to apply warm compresses.  Monitor for worsening swelling.  If increased swelling/pain over the weekend, she is advised to go to the ER.  Pt verbalizes understanding.      Doxy rx was cancelled at [facility] and resent to [facility].  I am having Kerra G. Almendarez maintain her aspirin, nystatin, ondansetron, loratadine, fluticasone, alclomethasone, clobetasol, clotrimazole-betamethasone, albuterol, isosorbide mononitrate, methocarbamol, potassium chloride SA, hyoscyamine, imipramine, SUMAtriptan, Vitamin D3, rosuvastatin, Repatha SureClick, neomycin-polymyxin-hydrocortisone, metoprolol succinate, Xiidra, metoCLOPramide, amLODipine, methimazole, cyclobenzaprine, ondansetron, traMADol, valsartan-hydrochlorothiazide, furosemide, nitroGLYCERIN, and doxycycline.  Meds ordered this encounter  Medications   DISCONTD: doxycycline (VIBRA-TABS) 100 MG tablet    Sig: Take 1 tablet (100 mg total) by mouth 2 (two) times daily.    Dispense:  14 tablet    Refill:  0    Order Specific Question:   Supervising Provider    Answer:   Penni Homans A [[id number]]   doxycycline (VIBRA-TABS) 100 MG tablet    Sig: Take 1 tablet (100 mg total) by mouth 2 (two) times daily.    Dispense:  14 tablet    Refill:  0    Order Specific Question:   Supervising Provider    Answer:   Penni Homans A [id number]

## 2022-09-21 ENCOUNTER — Ambulatory Visit (HOSPITAL_COMMUNITY): Payer: 59 | Attending: Cardiology

## 2022-09-21 DIAGNOSIS — R0602 Shortness of breath: Secondary | ICD-10-CM | POA: Diagnosis not present

## 2022-09-21 LAB — ECHOCARDIOGRAM COMPLETE
Area-P 1/2: 3.23 cm2
S' Lateral: 3.3 cm

## 2022-09-24 DIAGNOSIS — M5136 Other intervertebral disc degeneration, lumbar region: Secondary | ICD-10-CM | POA: Insufficient documentation

## 2022-09-24 DIAGNOSIS — M5416 Radiculopathy, lumbar region: Secondary | ICD-10-CM | POA: Insufficient documentation

## 2022-09-24 NOTE — Progress Notes (Signed)
   Established Patient Office Visit  Subjective   Patient ID: TAMIRAH JANET, female    DOB: 10-08-1960  Age: 62 y.o. MRN: [id number]  No chief complaint on file.   HPI  {History (Optional):23778}  ROS    Objective:     There were no vitals taken for this visit. {Vitals History (Optional):23777}  Physical Exam   No results found for any visits on 09/25/22.  {Labs (Optional):23779}  The ASCVD Risk score (Arnett DK, et al., 2019) failed to calculate for the following reasons:   The valid total cholesterol range is 130 to 320 mg/dL    Assessment & Plan:   Problem List Items Addressed This Visit     Controlled type 2 diabetes mellitus without complication, without long-term current use of insulin - Primary    No follow-ups on file.    Terrilyn Saver, NP

## 2022-09-24 NOTE — Addendum Note (Signed)
Addended by: Freada Bergeron on: 09/24/2022 05:28 PM   Modules accepted: Orders

## 2022-09-25 ENCOUNTER — Encounter: Payer: Self-pay | Admitting: Family Medicine

## 2022-09-25 ENCOUNTER — Ambulatory Visit (INDEPENDENT_AMBULATORY_CARE_PROVIDER_SITE_OTHER): Payer: 59 | Admitting: Family Medicine

## 2022-09-25 VITALS — BP 142/77 | HR 65 | Temp 97.8°F | Resp 18 | Ht 64.0 in | Wt 235.0 lb

## 2022-09-25 DIAGNOSIS — E119 Type 2 diabetes mellitus without complications: Secondary | ICD-10-CM

## 2022-09-25 DIAGNOSIS — L0201 Cutaneous abscess of face: Secondary | ICD-10-CM

## 2022-10-05 ENCOUNTER — Other Ambulatory Visit: Payer: Self-pay | Admitting: Internal Medicine

## 2022-10-10 NOTE — Progress Notes (Signed)
Office Visit    Patient Name: [name] Date of Encounter: 10/12/2022  PCP:  Sandford Craze, NP   [facility]  Cardiologist:  Dietrich Pates, MD  Advanced Practice Provider:  No care team member to display Electrophysiologist:  None   HPI    [name] is a 62 y.o. female with a past medical history of chest pain and CAD, esophageal strictures, type 2 diabetes mellitus, endometriosis, GERD, hypertension, hyperlipidemia, thyroid disease presents today for follow-up appointment.  In January 2019 presented with chest pain and vomiting that occurred after outpatient EGD/ dilatation.  Troponin was elevated.  She went on to a cardiac catheterization [place] [facility] which showed 75% mid PDA (small vessel), proximal RCA with 25%, OM 50%, LVEF normal.  Focal wall motion abnormality potentially an effective spasm.  She was seen by Dr. Tenny Craw March of last year and had complaints of chest pain.  Recommended left heart cardiac catheterization.  This showed no significant change from her previous study.  Only distal PDA had significant disease.  She was seen by Dr. Tenny Craw January 2024 and was having occasional sharp chest pains which last about a minute.  Described as pleuritic and substernal.  Breathing was okay.  She was seen by me 2/28, she tells me that she has some discomfort in her chest and it radiates to her left arm and her fingertips get cold.  Nonexertional and happens randomly.  She does have some shortness of breath as well.  She tells me she needs nitroglycerin about once a week.  She remains on 90 mg of Imdur.  She does have a strong family history of CAD in her father and brother as well.  She does endorse early fatigue with activity.  Decreased exercise tolerance.  Her last cardiac catheterization March 2023 was reviewed.  No PCI was indicated at that time.  Today, she states that she has not needed to use her nitro again. She has been chest pain free and not  had any significant SOB. She is set up for a in lab sleep study May 7th. We discussed updating a lipid panel today. Otherwise, doing well from a CV perspective.    Reports no shortness of breath nor dyspnea on exertion. Reports no chest pain, pressure, or tightness. No edema, orthopnea, PND. Reports no palpitations.   Past Medical History    Past Medical History:  Diagnosis Date   Allergy    allergic rhinitis   Arthritis    Atypical chest pain    Constipation    Cyst, ovarian    Diabetes mellitus, type II    Elevated alkaline phosphatase level    Endometriosis    Esophageal stricture    Fatty liver    Gastroparesis    GERD (gastroesophageal reflux disease)    Heart murmur    Hepatic hemangioma    Hyperlipidemia    Hypertension    Hypertrophic condition of skin    acrokeratoelastoidosis- s/p 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 1/08- benign   Iron deficiency anemia    Migraine headache    Non-compliance    Peptic ulcer disease    Thyroid disease    Past Surgical History:  Procedure Laterality Date   ABDOMINAL EXPLORATION SURGERY     w/bso    BREAST BIOPSY     CARDIAC CATHETERIZATION  2009   mild non obstructive CAD   CHOLECYSTECTOMY     COLONOSCOPY     ESOPHAGEAL MANOMETRY N/A 08/01/2020   Procedure: ESOPHAGEAL  MANOMETRY (EM);  Surgeon: Benancio Deeds, MD;  Location: [facility];  Service: Gastroenterology;  Laterality: N/A;   KNEE SURGERY  2005    left knee   LEFT HEART CATH AND CORONARY ANGIOGRAPHY N/A 07/17/2017   Procedure: LEFT HEART CATH AND CORONARY ANGIOGRAPHY;  Surgeon: [place], Peter M, MD;  Location: [facility] INVASIVE CV LAB;  Service: Cardiovascular;  Laterality: N/A;   LEFT HEART CATH AND CORONARY ANGIOGRAPHY N/A 09/01/2021   Procedure: LEFT HEART CATH AND CORONARY ANGIOGRAPHY;  Surgeon: Kathleene Hazel, MD;  Location: [facility] INVASIVE CV LAB;  Service: Cardiovascular;  Laterality: N/A;   POLYPECTOMY     TOTAL ABDOMINAL HYSTERECTOMY     UPPER GASTROINTESTINAL ENDOSCOPY       Allergies  Allergies  Allergen Reactions   Ace Inhibitors Cough   Celebrex [Celecoxib] Other (See Comments)    "makes me bleed"   Diovan [Valsartan]     angioedema    EKGs/Labs/Other Studies Reviewed:   The following studies were reviewed today:  Echo 09/21/22  IMPRESSIONS     1. Left ventricular ejection fraction, by estimation, is 60 to 65%. The  left ventricle has normal function. The left ventricle has no regional  wall motion abnormalities. Left ventricular diastolic parameters are  consistent with Grade I diastolic  dysfunction (impaired relaxation). The average left ventricular global  longitudinal strain is -21.0 %. The global longitudinal strain is normal.   2. Right ventricular systolic function is normal. The right ventricular  size is normal. Tricuspid regurgitation signal is inadequate for assessing  PA pressure.   3. The mitral valve is grossly normal. Trivial mitral valve  regurgitation. No evidence of mitral stenosis.   4. The aortic valve is tricuspid. There is moderate calcification of the  aortic valve. There is moderate thickening of the aortic valve. Aortic  valve regurgitation is trivial. Aortic valve sclerosis/calcification is  present, without any evidence of  aortic stenosis.   5. Aortic dilatation noted. There is borderline dilatation of the aortic  root, measuring 38 mm.   6. The inferior vena cava is dilated in size with <50% respiratory  variability, suggesting right atrial pressure of 15 mmHg.   Comparison(s): No significant change from prior study.    Coronary CTA 08/30/22  IMPRESSION: 1. Coronary calcium score of 1504. This was 56 percentile for age and sex matched control.   2. Normal coronary origin with right dominance.   3. CAD-RADS 3. Moderate stenosis. Consider symptom-guided anti-ischemic pharmacotherapy as well as risk factor modification per guideline directed care. CT FFR could not be performed due to significant  motion artifact. Therefore if symptoms persist with medical therapy consider other forms of diagnostic testing. Echo  June 2023   Left ventricular ejection fraction, by estimation, is 60 to 65%. Left ventricular ejection fraction by 3D volume is 61 %. The left ventricle has normal function. The left ventricle has no regional wall motion abnormalities. Left ventricular diastolic parameters are consistent with Grade I diastolic dysfunction (impaired relaxation). 1. Right ventricular systolic function is mildly reduced. The right ventricular size is normal. 2. The mitral valve is normal in structure. No evidence of mitral valve regurgitation. No evidence of mitral stenosis. 3. The aortic valve is normal in structure. There is severe calcifcation of the aortic valve. There is moderate thickening of the aortic valve. Aortic valve regurgitation is mild to moderate. Aortic valve sclerosis/calcification is present, without any evidence of aortic stenosis. 4. The inferior vena cava is dilated in size with >50%  respiratory variability, suggesting right atrial pressure of 8 mmHg.   March  2023 [facility]    Prox RCA to Mid RCA lesion is 25% stenosed.   Ost Ramus to Ramus lesion is 50% stenosed.   Dist RCA lesion is 40% stenosed.   RPDA lesion is 90% stenosed.   Prox Cx to Mid Cx lesion is 30% stenosed.   2nd Mrg lesion is 50% stenosed.   Mid LAD lesion is 30% stenosed.   The left ventricular systolic function is normal.   LV end diastolic pressure is normal.   The left ventricular ejection fraction is 55-65% by visual estimate.   There is no mitral valve regurgitation.   The LAD has mild non-obstructive disease The Circumflex has mild non-obstructive disease The intermediate branch is a moderate caliber vessel with moderate stenosis, unchanged from last cath in 2019 The RCA is a large dominant vessel with mild diffuse calcific disease in the proximal, mid and distal vessel. The small caliber  PDA has severe stenosis, overall unchanged from cath in 2019. The PDA is too small for PCI.  Normal LV systolic function. Normal LVEDP.    Recommendations: The PDA is too small for PCI and is unchanged from cath in 2019. Her diffuse mild to moderate CAD is overall unchanged. Continue medical management of CAD.  EKG:  EKG is  ordered today.  The ekg ordered today demonstrates sinus bradycardia, rate 56 bpm  Recent Labs: 07/27/2022: TSH 0.97 08/14/2022: ALT 31; B Natriuretic Peptide 11.2; BUN 19; Creatinine, Ser 0.90; Hemoglobin 12.1; Platelets 263; Potassium 3.8; Sodium 137  Recent Lipid Panel    Component Value Date/Time   CHOL 114 12/01/2021 0900   CHOL 92 (L) 11/24/2021 0737   TRIG 131.0 12/01/2021 0900   TRIG 103 04/30/2006 0949   HDL 50.10 12/01/2021 0900   HDL 54 11/24/2021 0737   CHOLHDL 2 12/01/2021 0900   VLDL 26.2 12/01/2021 0900   LDLCALC 38 12/01/2021 0900   LDLCALC 21 11/24/2021 0737   LDLDIRECT 76 07/21/2019 0756   LDLDIRECT 131.0 10/31/2016 1555    Home Medications   Current Meds  Medication Sig   albuterol (VENTOLIN HFA) 108 (90 Base) MCG/ACT inhaler INHALE 2 PUFFS BY MOUTH 4 TIMES DAILY   alclomethasone (ACLOVATE) 0.05 % cream Apply topically 2 (two) times daily as needed (Rash).   amLODipine (NORVASC) 10 MG tablet Take 1 tablet by mouth once daily   aspirin 81 MG chewable tablet Chew 1 tablet (81 mg total) by mouth daily.   Cholecalciferol (VITAMIN D3) 75 MCG (3000 UT) TABS Take 1 tablet by mouth daily.   clobetasol (TEMOVATE) 0.05 % external solution Apply 1 application topically 2 (two) times daily.   clotrimazole-betamethasone (LOTRISONE) cream Apply 1 application topically 2 (two) times daily.   cyclobenzaprine (FLEXERIL) 5 MG tablet 5 mg 3 (three) times daily as needed.   doxycycline (VIBRA-TABS) 100 MG tablet Take 1 tablet (100 mg total) by mouth 2 (two) times daily.   Evolocumab (REPATHA SURECLICK) 140 MG/ML SOAJ Inject 1 pen (140 mg) subcutaneously every  14 days   fluticasone (FLONASE) 50 MCG/ACT nasal spray Place 2 sprays into both nostrils daily.   furosemide (LASIX) 20 MG tablet Take 1 tablet (20 mg total) by mouth daily as needed (for shortness of breath or weight gain of 2 lbs overnight or 5 lbs in a week).   hyoscyamine (LEVSIN) 0.125 MG tablet Take 1 tablet (0.125 mg total) by mouth every 4 (four) hours as needed for cramping (  diarrhea,nausea).   imipramine (TOFRANIL) 25 MG tablet Take 50 mg by mouth at bedtime.   isosorbide mononitrate (IMDUR) 60 MG 24 hr tablet Take 1.5 tablets (90 mg total) by mouth daily.   loratadine (CLARITIN) 10 MG tablet Take 1 tablet (10 mg total) by mouth daily.   methimazole (TAPAZOLE) 5 MG tablet Take 1 tablet (5 mg total) by mouth daily.   methocarbamol (ROBAXIN) 500 MG tablet Take 1 tablet (500 mg total) by mouth every 8 (eight) hours as needed for muscle spasms.   metoCLOPramide (REGLAN) 5 MG tablet TAKE 1 TABLET BY MOUTH THREE TIMES DAILY BEFORE MEAL(S)   metoprolol succinate (TOPROL-XL) 100 MG 24 hr tablet Take 1 tablet (100 mg total) by mouth daily. Take with or immediately following a meal.   neomycin-polymyxin-hydrocortisone (CORTISPORIN) OTIC solution Place 3 drops into the right ear 4 (four) times daily.   nitroGLYCERIN (NITROSTAT) 0.4 MG SL tablet DISSOLVE ONE TABLET UNDER THE TONGUE EVERY 5 MINUTES AS NEEDED FOR CHEST PAIN.  DO NOT EXCEED A TOTAL OF 3 DOSES IN 15 MINUTES   nystatin (MYCOSTATIN/NYSTOP) powder Apply 1 application topically 2 (two) times daily. Beneath both breasts   ondansetron (ZOFRAN) 4 MG tablet Take 1 tab by mouth every 6 hours as needed for nausea.   ondansetron (ZOFRAN-ODT) 8 MG disintegrating tablet Take 8 mg by mouth every 8 (eight) hours as needed for vomiting or nausea.   potassium chloride SA (KLOR-CON M20) 20 MEQ tablet Take 1 tablet by mouth once daily   rosuvastatin (CRESTOR) 40 MG tablet Take 1 tablet (40 mg total) by mouth daily.   SUMAtriptan (IMITREX) 50 MG tablet Take  1 tablet (50 mg total) by mouth every 2 (two) hours as needed for migraine. May repeat in 2 hours if headache persists or recurs.   traMADol (ULTRAM) 50 MG tablet Take 50 mg by mouth as needed for moderate pain.   valsartan-hydrochlorothiazide (DIOVAN HCT) 160-12.5 MG tablet Take 1 tablet by mouth daily.   XIIDRA 5 % SOLN Apply 1 drop to eye 2 (two) times daily.     Review of Systems      All other systems reviewed and are otherwise negative except as noted above.  Physical Exam    VS:  BP 136/74   Pulse 69   Ht 5\' 4"  (1.626 m)   Wt 234 lb (106.1 kg)   SpO2 99%   BMI 40.17 kg/m  , BMI Body mass index is 40.17 kg/m.  Wt Readings from Last 3 Encounters:  10/12/22 234 lb (106.1 kg)  09/25/22 235 lb (106.6 kg)  09/20/22 233 lb (105.7 kg)     GEN: Well nourished, well developed, in no acute distress. HEENT: normal. Neck: Supple, no JVD, carotid bruits, or masses. Cardiac: RRR, no murmurs, rubs, or gallops. No clubbing, cyanosis, 1+ LE edema.  Radials/PT 2+ and equal bilaterally.  Respiratory:  Respirations regular and unlabored, clear to auscultation bilaterally. GI: Soft, nontender, nondistended. MS: No deformity or atrophy. Skin: Warm and dry, no rash. Neuro:  Strength and sensation are intact. Psych: Normal affect.  Assessment & Plan    CAD -coronary CTA and echo reviewed with the patient today -She had a calcium score of 1504, which is 99% for age and sex, recommended medical therapy and rick modification -She can continue her current medications which include aspirin 81 mg, amlodipine 10 mg daily, Imdur 90 mg daily, metoprolol succinate 100 mg daily, nitro as needed, Crestor 40 mg daily, and Repatha 140 mg/mL  every 14 days -update lipid panel and LFTs  Chest pain, atypical -resolved  -patient has not needed nitro since I last saw her in the clinic -continue medications including Imdur and watch BP   Hypertension -Well-controlled today -Continue to monitor blood  pressure at home -Can continue current medication regimen  Hyperlipidemia -Continue Crestor and Repatha -LDL 38 (11/2021) -Triglycerides 131 -repeat lipid panel and LFTs  Obesity/fluid overload -Additional risk factor for metabolic syndrome -Encouraged low-sodium diet and monitoring weight daily -continue Lasix 20 mg daily as needed for weight greater than 2 pounds overnight or 5 pounds in a week    Disposition: Follow up 6 months with Dietrich Pates, MD or APP.  Signed, Sharlene Dory, PA-C 10/12/2022, 8:30 AM [facility]

## 2022-10-12 ENCOUNTER — Encounter: Payer: Self-pay | Admitting: Physician Assistant

## 2022-10-12 ENCOUNTER — Ambulatory Visit: Payer: 59 | Attending: Physician Assistant | Admitting: Physician Assistant

## 2022-10-12 VITALS — BP 136/74 | HR 69 | Ht 64.0 in | Wt 234.0 lb

## 2022-10-12 DIAGNOSIS — R079 Chest pain, unspecified: Secondary | ICD-10-CM

## 2022-10-12 DIAGNOSIS — E8770 Fluid overload, unspecified: Secondary | ICD-10-CM | POA: Diagnosis not present

## 2022-10-12 DIAGNOSIS — I2510 Atherosclerotic heart disease of native coronary artery without angina pectoris: Secondary | ICD-10-CM

## 2022-10-12 DIAGNOSIS — I10 Essential (primary) hypertension: Secondary | ICD-10-CM

## 2022-10-12 DIAGNOSIS — E785 Hyperlipidemia, unspecified: Secondary | ICD-10-CM | POA: Diagnosis not present

## 2022-10-12 NOTE — Patient Instructions (Signed)
Medication Instructions:   Your physician recommends that you continue on your current medications as directed. Please refer to the Current Medication list given to you today.  *If you need a refill on your cardiac medications before your next appointment, please call your pharmacy*   Lab Work:   LFT AND LIPIDS TODAY   If you have labs (blood work) drawn today and your tests are completely normal, you will receive your results only by: MyChart Message (if you have MyChart) OR A paper copy in the mail If you have any lab test that is abnormal or we need to change your treatment, we will call you to review the results.   Testing/Procedures: NONE ORDERED  TODAY     Follow-Up: At [facility], you and your health needs are our priority.  As part of our continuing mission to provide you with exceptional heart care, we have created designated Provider Care Teams.  These Care Teams include your primary Cardiologist (physician) and Advanced Practice Providers (APPs -  Physician Assistants and Nurse Practitioners) who all work together to provide you with the care you need, when you need it.  We recommend signing up for the patient portal called "MyChart".  Sign up information is provided on this After Visit Summary.  MyChart is used to connect with patients for Virtual Visits (Telemedicine).  Patients are able to view lab/test results, encounter notes, upcoming appointments, etc.  Non-urgent messages can be sent to your provider as well.   To learn more about what you can do with MyChart, go to [email or web address].    Your next appointment:   6 month(s)  Provider:   Dietrich Pates, MD     Other Instructions  Heart-Healthy Eating Plan Eating a healthy diet is important for the health of your heart. A heart-healthy eating plan includes: Eating less unhealthy fats. Eating more healthy fats. Eating less salt in your food. Salt is also called sodium. Making other changes in your  diet. Talk with your doctor or a diet specialist (dietitian) to create an eating plan that is right for you. What is my plan? Your doctor may recommend an eating plan that includes: Total fat: ______% or less of total calories a day. Saturated fat: ______% or less of total calories a day. Cholesterol: less than _________mg a day. Sodium: less than _________mg a day. What are tips for following this plan? Cooking Avoid frying your food. Try to bake, boil, grill, or broil it instead. You can also reduce fat by: Removing the skin from poultry. Removing all visible fats from meats. Steaming vegetables in water or broth. Meal planning  At meals, divide your plate into four equal parts: Fill one-half of your plate with vegetables and green salads. Fill one-fourth of your plate with whole grains. Fill one-fourth of your plate with lean protein foods. Eat 2-4 cups of vegetables per day. One cup of vegetables is: 1 cup (91 g) broccoli or cauliflower florets. 2 medium carrots. 1 large bell pepper. 1 large sweet potato. 1 large tomato. 1 medium white potato. 2 cups (150 g) raw leafy greens. Eat 1-2 cups of fruit per day. One cup of fruit is: 1 small apple 1 large banana 1 cup (237 g) mixed fruit, 1 large orange,  cup (82 g) dried fruit, 1 cup (240 mL) 100% fruit juice. Eat more foods that have soluble fiber. These are apples, broccoli, carrots, beans, peas, and barley. Try to get 20-30 g of fiber per day. Eat 4-5 servings  of nuts, legumes, and seeds per week: 1 serving of dried beans or legumes equals  cup (90 g) cooked. 1 serving of nuts is  oz (12 almonds, 24 pistachios, or 7 walnut halves). 1 serving of seeds equals  oz (8 g). General information Eat more home-cooked food. Eat less restaurant, buffet, and fast food. Limit or avoid alcohol. Limit foods that are high in starch and sugar. Avoid fried foods. Lose weight if you are overweight. Keep track of how much salt  (sodium) you eat. This is important if you have high blood pressure. Ask your doctor to tell you more about this. Try to add vegetarian meals each week. Fats Choose healthy fats. These include olive oil and canola oil, flaxseeds, walnuts, almonds, and seeds. Eat more omega-3 fats. These include salmon, mackerel, sardines, tuna, flaxseed oil, and ground flaxseeds. Try to eat fish at least 2 times each week. Check food labels. Avoid foods with trans fats or high amounts of saturated fat. Limit saturated fats. These are often found in animal products, such as meats, butter, and cream. These are also found in plant foods, such as palm oil, palm kernel oil, and coconut oil. Avoid foods with partially hydrogenated oils in them. These have trans fats. Examples are stick margarine, some tub margarines, cookies, crackers, and other baked goods. What foods should I eat? Fruits All fresh, canned (in natural juice), or frozen fruits. Vegetables Fresh or frozen vegetables (raw, steamed, roasted, or grilled). Green salads. Grains Most grains. Choose whole wheat and whole grains most of the time. Rice and pasta, including brown rice and pastas made with whole wheat. Meats and other proteins Lean, well-trimmed beef, veal, pork, and lamb. Chicken and [place] without skin. All fish and shellfish. Wild duck, rabbit, pheasant, and venison. Egg whites or low-cholesterol egg substitutes. Dried beans, peas, lentils, and tofu. Seeds and most nuts. Dairy Low-fat or nonfat cheeses, including ricotta and mozzarella. Skim or 1% milk that is liquid, powdered, or evaporated. Buttermilk that is made with low-fat milk. Nonfat or low-fat yogurt. Fats and oils Non-hydrogenated (trans-free) margarines. Vegetable oils, including soybean, sesame, sunflower, olive, peanut, safflower, corn, canola, and cottonseed. Salad dressings or mayonnaise made with a vegetable oil. Beverages Mineral water. Coffee and tea. Diet carbonated  beverages. Sweets and desserts Sherbet, gelatin, and fruit ice. Small amounts of dark chocolate. Limit all sweets and desserts. Seasonings and condiments All seasonings and condiments. The items listed above may not be a complete list of foods and drinks you can eat. Contact a dietitian for more options. What foods should I avoid? Fruits Canned fruit in heavy syrup. Fruit in cream or butter sauce. Fried fruit. Limit coconut. Vegetables Vegetables cooked in cheese, cream, or butter sauce. Fried vegetables. Grains Breads that are made with saturated or trans fats, oils, or whole milk. Croissants. Sweet rolls. Donuts. High-fat crackers, such as cheese crackers. Meats and other proteins Fatty meats, such as hot dogs, ribs, sausage, bacon, rib-eye roast or steak. High-fat deli meats, such as salami and bologna. Caviar. Domestic duck and goose. Organ meats, such as liver. Dairy Cream, sour cream, cream cheese, and creamed cottage cheese. Whole-milk cheeses. Whole or 2% milk that is liquid, evaporated, or condensed. Whole buttermilk. Cream sauce or high-fat cheese sauce. Yogurt that is made from whole milk. Fats and oils Meat fat, or shortening. Cocoa butter, hydrogenated oils, palm oil, coconut oil, palm kernel oil. Solid fats and shortenings, including bacon fat, salt pork, lard, and butter. Nondairy cream substitutes. Salad dressings  with cheese or sour cream. Beverages Regular sodas and juice drinks with added sugar. Sweets and desserts Frosting. Pudding. Cookies. Cakes. Pies. Milk chocolate or white chocolate. Buttered syrups. Full-fat ice cream or ice cream drinks. The items listed above may not be a complete list of foods and drinks to avoid. Contact a dietitian for more information. Summary Heart-healthy meal planning includes eating less unhealthy fats, eating more healthy fats, and making other changes in your diet. Eat a balanced diet. This includes fruits and vegetables, low-fat or  nonfat dairy, lean protein, nuts and legumes, whole grains, and heart-healthy oils and fats. This information is not intended to replace advice given to you by your health care provider. Make sure you discuss any questions you have with your health care provider. Document Revised: 07/17/2021 Document Reviewed: 07/17/2021 Elsevier Patient Education  2023 Elsevier Inc.   Low-Sodium Eating Plan Sodium, which is an element that makes up salt, helps you maintain a healthy balance of fluids in your body. Too much sodium can increase your blood pressure and cause fluid and waste to be held in your body. Your health care provider or dietitian may recommend following this plan if you have high blood pressure (hypertension), kidney disease, liver disease, or heart failure. Eating less sodium can help lower your blood pressure, reduce swelling, and protect your heart, liver, and kidneys. What are tips for following this plan? Reading food labels The Nutrition Facts label lists the amount of sodium in one serving of the food. If you eat more than one serving, you must multiply the listed amount of sodium by the number of servings. Choose foods with less than 140 mg of sodium per serving. Avoid foods with 300 mg of sodium or more per serving. Shopping  Look for lower-sodium products, often labeled as "low-sodium" or "no salt added." Always check the sodium content, even if foods are labeled as "unsalted" or "no salt added." Buy fresh foods. Avoid canned foods and pre-made or frozen meals. Avoid canned, cured, or processed meats. Buy breads that have less than 80 mg of sodium per slice. Cooking  Eat more home-cooked food and less restaurant, buffet, and fast food. Avoid adding salt when cooking. Use salt-free seasonings or herbs instead of table salt or sea salt. Check with your health care provider or pharmacist before using salt substitutes. Cook with plant-based oils, such as canola, sunflower, or  olive oil. Meal planning When eating at a restaurant, ask that your food be prepared with less salt or no salt, if possible. Avoid dishes labeled as brined, pickled, cured, smoked, or made with soy sauce, miso, or teriyaki sauce. Avoid foods that contain MSG (monosodium glutamate). MSG is sometimes added to [place] food, bouillon, and some canned foods. Make meals that can be grilled, baked, poached, roasted, or steamed. These are generally made with less sodium. General information Most people on this plan should limit their sodium intake to 1,500-2,000 mg (milligrams) of sodium each day. What foods should I eat? Fruits Fresh, frozen, or canned fruit. Fruit juice. Vegetables Fresh or frozen vegetables. "No salt added" canned vegetables. "No salt added" tomato sauce and paste. Low-sodium or reduced-sodium tomato and vegetable juice. Grains Low-sodium cereals, including oats, puffed wheat and rice, and shredded wheat. Low-sodium crackers. Unsalted rice. Unsalted pasta. Low-sodium bread. Whole-grain breads and whole-grain pasta. Meats and other proteins Fresh or frozen (no salt added) meat, poultry, seafood, and fish. Low-sodium canned tuna and salmon. Unsalted nuts. Dried peas, beans, and lentils without added salt.  Unsalted canned beans. Eggs. Unsalted nut butters. Dairy Milk. Soy milk. Cheese that is naturally low in sodium, such as ricotta cheese, fresh mozzarella, or Swiss cheese. Low-sodium or reduced-sodium cheese. Cream cheese. Yogurt. Seasonings and condiments Fresh and dried herbs and spices. Salt-free seasonings. Low-sodium mustard and ketchup. Sodium-free salad dressing. Sodium-free light mayonnaise. Fresh or refrigerated horseradish. Lemon juice. Vinegar. Other foods Homemade, reduced-sodium, or low-sodium soups. Unsalted popcorn and pretzels. Low-salt or salt-free chips. The items listed above may not be a complete list of foods and beverages you can eat. Contact a dietitian for  more information. What foods should I avoid? Vegetables Sauerkraut, pickled vegetables, and relishes. Olives. [place] fries. Onion rings. Regular canned vegetables (not low-sodium or reduced-sodium). Regular canned tomato sauce and paste (not low-sodium or reduced-sodium). Regular tomato and vegetable juice (not low-sodium or reduced-sodium). Frozen vegetables in sauces. Grains Instant hot cereals. Bread stuffing, pancake, and biscuit mixes. Croutons. Seasoned rice or pasta mixes. Noodle soup cups. Boxed or frozen macaroni and cheese. Regular salted crackers. Self-rising flour. Meats and other proteins Meat or fish that is salted, canned, smoked, spiced, or pickled. Precooked or cured meat, such as sausages or meat loaves. Tomasa Blase. Ham. Pepperoni. Hot dogs. Corned beef. Chipped beef. Salt pork. Jerky. Pickled herring. Anchovies and sardines. Regular canned tuna. Salted nuts. Dairy Processed cheese and cheese spreads. Hard cheeses. Cheese curds. Blue cheese. Feta cheese. String cheese. Regular cottage cheese. Buttermilk. Canned milk. Fats and oils Salted butter. Regular margarine. Ghee. Bacon fat. Seasonings and condiments Onion salt, garlic salt, seasoned salt, table salt, and sea salt. Canned and packaged gravies. Worcestershire sauce. Tartar sauce. Barbecue sauce. Teriyaki sauce. Soy sauce, including reduced-sodium. Steak sauce. Fish sauce. Oyster sauce. Cocktail sauce. Horseradish that you find on the shelf. Regular ketchup and mustard. Meat flavorings and tenderizers. Bouillon cubes. Hot sauce. Pre-made or packaged marinades. Pre-made or packaged taco seasonings. Relishes. Regular salad dressings. Salsa. Other foods Salted popcorn and pretzels. Corn chips and puffs. Potato and tortilla chips. Canned or dried soups. Pizza. Frozen entrees and pot pies. The items listed above may not be a complete list of foods and beverages you should avoid. Contact a dietitian for more information. Summary Eating  less sodium can help lower your blood pressure, reduce swelling, and protect your heart, liver, and kidneys. Most people on this plan should limit their sodium intake to 1,500-2,000 mg (milligrams) of sodium each day. Canned, boxed, and frozen foods are high in sodium. Restaurant foods, fast foods, and pizza are also very high in sodium. You also get sodium by adding salt to food. Try to cook at home, eat more fresh fruits and vegetables, and eat less fast food and canned, processed, or prepared foods. This information is not intended to replace advice given to you by your health care provider. Make sure you discuss any questions you have with your health care provider. Document Revised: 05/18/2019 Document Reviewed: 05/13/2019 Elsevier Patient Education  2023 [employer].

## 2022-10-13 LAB — HEPATIC FUNCTION PANEL
ALT: 32 IU/L (ref 0–32)
AST: 20 IU/L (ref 0–40)
Albumin: 4.3 g/dL (ref 3.9–4.9)
Alkaline Phosphatase: 152 IU/L — ABNORMAL HIGH (ref 44–121)
Bilirubin Total: <0.2 mg/dL (ref 0.0–1.2)
Bilirubin, Direct: <0.1 mg/dL (ref 0.00–0.40)
Total Protein: 6.8 g/dL (ref 6.0–8.5)

## 2022-10-13 LAB — LIPID PANEL
Chol/HDL Ratio: 2.4 ratio (ref 0.0–4.4)
Cholesterol, Total: 117 mg/dL (ref 100–199)
HDL: 49 mg/dL (ref >39)
LDL Chol Calc (NIH): 43 mg/dL (ref 0–99)
Triglycerides: 149 mg/dL (ref 0–149)
VLDL Cholesterol Cal: 25 mg/dL (ref 5–40)

## 2022-10-16 ENCOUNTER — Telehealth: Payer: Self-pay | Admitting: Family

## 2022-10-16 DIAGNOSIS — R748 Abnormal levels of other serum enzymes: Secondary | ICD-10-CM

## 2022-10-16 NOTE — Telephone Encounter (Signed)
Please advise pt that one of her blood tests called alkaline phosphatase was elevated at cardiology.  I would like her to return to our lab for some additional lab testing please for further evaluation.

## 2022-10-16 NOTE — Telephone Encounter (Signed)
Patient advised of results and scheduled to come in Friday for additional testing

## 2022-10-19 ENCOUNTER — Other Ambulatory Visit: Payer: Self-pay | Admitting: Family

## 2022-10-19 ENCOUNTER — Telehealth: Payer: Self-pay | Admitting: Family

## 2022-10-19 ENCOUNTER — Other Ambulatory Visit (INDEPENDENT_AMBULATORY_CARE_PROVIDER_SITE_OTHER): Payer: 59

## 2022-10-19 DIAGNOSIS — R748 Abnormal levels of other serum enzymes: Secondary | ICD-10-CM | POA: Diagnosis not present

## 2022-10-19 DIAGNOSIS — E0590 Thyrotoxicosis, unspecified without thyrotoxic crisis or storm: Secondary | ICD-10-CM

## 2022-10-19 LAB — T3, FREE: T3, Free: 3.5 pg/mL (ref 2.3–4.2)

## 2022-10-19 LAB — TSH: TSH: 4.39 u[IU]/mL (ref 0.35–5.50)

## 2022-10-19 LAB — T4, FREE: Free T4: 0.68 ng/dL (ref 0.60–1.60)

## 2022-10-19 NOTE — Addendum Note (Signed)
Addended by: Mervin Kung A on: 10/19/2022 09:40 AM   Modules accepted: Orders

## 2022-10-19 NOTE — Telephone Encounter (Signed)
Form placed in provider's folder for review and signature.

## 2022-10-19 NOTE — Telephone Encounter (Signed)
Pt dropped off document to be filled out by provider (1 page Disability Parking Placard) Pt would like to be called when document ready to pick up. Document put at front office tray under providers name.

## 2022-10-26 ENCOUNTER — Encounter: Payer: Self-pay | Admitting: Family

## 2022-10-26 LAB — ALKALINE PHOSPHATASE, ISOENZYMES
Alkaline Phosphatase: 137 IU/L — ABNORMAL HIGH (ref 44–121)
BONE FRACTION: 35 % (ref 14–68)
INTESTINAL FRAC.: 0 % (ref 0–18)
LIVER FRACTION: 65 % (ref 18–85)

## 2022-10-29 NOTE — Progress Notes (Signed)
Letter Mailed

## 2022-10-30 ENCOUNTER — Ambulatory Visit (HOSPITAL_BASED_OUTPATIENT_CLINIC_OR_DEPARTMENT_OTHER): Payer: 59 | Attending: Cardiology | Admitting: Cardiology

## 2022-10-30 VITALS — Ht 64.0 in | Wt 234.0 lb

## 2022-10-30 DIAGNOSIS — I493 Ventricular premature depolarization: Secondary | ICD-10-CM | POA: Diagnosis not present

## 2022-10-30 DIAGNOSIS — G4733 Obstructive sleep apnea (adult) (pediatric): Secondary | ICD-10-CM | POA: Diagnosis not present

## 2022-11-01 NOTE — Procedures (Signed)
   Patient Name: [name], [name] Date: 10/30/2022 Gender: Female D.O.B: 1960/07/18 Age (years): 62 Referring Provider: Dietrich Pates Height (inches): 64 Interpreting Physician: Armanda Magic MD, ABSM Weight (lbs): 234 RPSGT: Ulyess Mort BMI: 40 MRN: [id number] Neck Size: 16.50  CLINICAL INFORMATION The patient is referred for a BiPAP titration to treat sleep apnea.  SLEEP STUDY TECHNIQUE As per the AASM Manual for the Scoring of Sleep and Associated Events v2.3 (April 2016) with a hypopnea requiring 4% desaturations.  The channels recorded and monitored were frontal, central and occipital EEG, electrooculogram (EOG), submentalis EMG (chin), nasal and oral airflow, thoracic and abdominal wall motion, anterior tibialis EMG, snore microphone, electrocardiogram, and pulse oximetry. Bilevel positive airway pressure (BPAP) was initiated at the beginning of the study and titrated to treat sleep-disordered breathing.  MEDICATIONS Medications self-administered by patient taken the night of the study : ADVIL PM  RESPIRATORY PARAMETERS Optimal IPAP Pressure (cm): 16  AHI at Optimal Pressure (/hr) 2.6 Overall Minimal O2 (%):92.0  Minimal O2 at Optimal Pressure (%): 94.0  SLEEP ARCHITECTURE Start Time:11:01:15 PM  Stop Time:5:01:52 AM  Total Time (min):360.6  Total Sleep Time (min):306 Sleep Latency (min):14.1 Sleep Efficiency (%): 84.9%  REM Latency (min): 55.0  WASO (min):40.6 Stage N1 (%): 15.2%  Stage N2 (%): 64.9%  Stage N3 (%): 0.0%  Stage R (%):19.9 Supine (%):76.80  Arousal Index (/hr):24.7   CARDIAC DATA The 2 lead EKG demonstrated sinus rhythm. The mean heart rate was 63.3 beats per minute. Other EKG findings include: PVCs.  LEG MOVEMENT DATA The total Periodic Limb Movements of Sleep (PLMS) were 0. The PLMS index was 0.0. A PLMS index of <15 is considered normal in adults.  IMPRESSIONS - An optimal PAP pressure was selected for this patient ( 16 cm of  water) - Central sleep apnea was not noted during this titration (CAI = 0.4/h). - Significant oxygen desaturations were not observed during this titration (min O2 = 92.0%). - The patient snored with moderate snoring volume. - 2-lead EKG demonstrated: PVCs - Clinically significant periodic limb movements were not noted during this study. Arousals associated with PLMs were rare.  DIAGNOSIS - Obstructive Sleep Apnea (G47.33)  RECOMMENDATIONS - Trial of ResMed CPAP therapy on 16 cm H2O with a Large size [employer] Full Face Evora Full mask and heated humidification. - Avoid alcohol, sedatives and other CNS depressants that may worsen sleep apnea and disrupt normal sleep architecture. - Sleep hygiene should be reviewed to assess factors that may improve sleep quality. - Weight management and regular exercise should be initiated or continued. - Return to Sleep Center for re-evaluation after 4 weeks of therapy  [Electronically signed] 11/01/2022 08:48 PM  Armanda Magic MD, ABSM Diplomate, American Board of Sleep Medicine

## 2022-11-01 NOTE — Progress Notes (Signed)
Subjective:   By signing my name below, I, [name], attest that this documentation has been prepared under the direction and in the presence of Denise Fillers, NP 11/02/22   Patient ID: [name], female    DOB: 11-08-1960, 62 y.o.   MRN: [id number]  Chief Complaint  Patient presents with   Joint Pain    Complains of joint pain throughout     HPI Patient is in today for an office visit.   Arthritis:  She complains of joint pain throughout and worse in her left arm and leg. She states the pain has been waking her up at night. She also endorses neck pain.   Reports improvement in her facial cellulitis but there is still some swelling.   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/08- benign   Iron deficiency anemia    Migraine headache    Non-compliance    Peptic ulcer disease    Thyroid disease     Past Surgical History:  Procedure Laterality Date   ABDOMINAL EXPLORATION SURGERY     w/bso    BREAST BIOPSY     CARDIAC CATHETERIZATION  2009   mild non obstructive CAD   CHOLECYSTECTOMY     COLONOSCOPY     ESOPHAGEAL MANOMETRY N/A 08/01/2020   Procedure: ESOPHAGEAL MANOMETRY (EM);  Surgeon: Benancio Deeds, MD;  Location: [facility];  Service: Gastroenterology;  Laterality: N/A;   KNEE SURGERY  2005    left knee   LEFT HEART CATH AND CORONARY ANGIOGRAPHY N/A 07/17/2017   Procedure: LEFT HEART CATH AND CORONARY ANGIOGRAPHY;  Surgeon: Swazil[place], Peter M, MD;  Location: [facility] INVASIVE CV LAB;  Service:  Cardiovascular;  Laterality: N/A;   LEFT HEART CATH AND CORONARY ANGIOGRAPHY N/A 09/01/2021   Procedure: LEFT HEART CATH AND CORONARY ANGIOGRAPHY;  Surgeon: Kathleene Hazel, MD;  Location: [facility] INVASIVE CV LAB;  Service: Cardiovascular;  Laterality: N/A;   POLYPECTOMY     TOTAL ABDOMINAL HYSTERECTOMY     UPPER GASTROINTESTINAL ENDOSCOPY      Family History  Problem Relation Age of Onset   Hypertension Mother    Rheum arthritis Mother    Hypertension Father    Diabetes Father    Prostate cancer Father    Kidney disease Father    Diabetes Sister    Fibromyalgia Sister    Diabetes Maternal Grandmother    Lung cancer Maternal Grandfather    Arthritis Brother    Migraines Brother    CAD Brother    Fibromyalgia Sister    Migraines Sister    Colon cancer Neg Hx    Thyroid disease Neg Hx    Colon polyps Neg Hx     Social History   Socioeconomic History   Marital status: Widowed    Spouse name: Not on file   Number of children: 2   Years of education: Not on file   Highest education  level: Not on file  Occupational History   Occupation: DISABILITY    Employer: UNEMPLOYED  Tobacco Use   Smoking status: Former    Packs/day: 0.50    Years: 18.00    Additional pack years: 0.00    Total pack years: 9.00    Types: Cigarettes    Start date: 07/29/1977    Quit date: 06/25/1994    Years since quitting: 28.3   Smokeless tobacco: Never   Tobacco comments:    quit 19 years ago  Vaping Use   Vaping Use: Never used  Substance and Sexual Activity   Alcohol use: Yes    Alcohol/week: 0.0 standard drinks of alcohol    Comment: social drinker   Drug use: No   Sexual activity: Not Currently  Other Topics Concern   Not on file  Social History Narrative   Widowed   Has 2 grown children.  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    Former Smoker Quit tobacco in 1996.  She was a pack a day smoker for approximately 10 years.    Alcohol  use-yes: Social    Daily Caffeine Use:6 pack of pepsi daily     Illicit Drug Use - no    Patient does not get regular exercise.       Smoking Status:  quit   Social Determinants of Health   Financial Resource Strain: Low Risk  (06/02/2021)   Overall Financial Resource Strain (CARDIA)    Difficulty of Paying Living Expenses: Not hard at all  Food Insecurity: No Food Insecurity (06/08/2022)   Hunger Vital Sign    Worried About Running Out of Food in the Last Year: Never true    Ran Out of Food in the Last Year: Never true  Transportation Needs: No Transportation Needs (06/08/2022)   PRAPARE - Administrator, Civil Service (Medical): No    Lack of Transportation (Non-Medical): No  Physical Activity: Insufficiently Active (06/02/2021)   Exercise Vital Sign    Days of Exercise per Week: 2 days    Minutes of Exercise per Session: 30 min  Stress: No Stress Concern Present (06/02/2021)   [employer] of Occupational Health - Occupational Stress Questionnaire    Feeling of Stress : Not at all  Social Connections: Moderately Isolated (06/02/2021)   Social Connection and Isolation Panel [NHANES]    Frequency of Communication with Friends and Family: More than three times a week    Frequency of Social Gatherings with Friends and Family: More than three times a week    Attends Religious Services: More than 4 times per year    Active Member of [employer] or Organizations: No    Attends Banker Meetings: Never    Marital Status: Widowed  Intimate Partner Violence: Not At Risk (06/08/2022)   Humiliation, Afraid, Rape, and Kick questionnaire    Fear of Current or Ex-Partner: No    Emotionally Abused: No    Physically Abused: No    Sexually Abused: No    Outpatient Medications Prior to Visit  Medication Sig Dispense Refill   albuterol (VENTOLIN HFA) 108 (90 Base) MCG/ACT inhaler INHALE 2 PUFFS BY MOUTH 4 TIMES DAILY 9 g 0   alclomethasone (ACLOVATE) 0.05 % cream Apply  topically 2 (two) times daily as needed (Rash). 180 g 3   amLODipine (NORVASC) 10 MG tablet Take 1 tablet by mouth once daily 90 tablet 0   aspirin 81 MG chewable  tablet Chew 1 tablet (81 mg total) by mouth daily.     Cholecalciferol (VITAMIN D3) 75 MCG (3000 UT) TABS Take 1 tablet by mouth daily. 30 tablet    clobetasol (TEMOVATE) 0.05 % external solution Apply 1 application topically 2 (two) times daily. 50 mL 6   clotrimazole-betamethasone (LOTRISONE) cream Apply 1 application topically 2 (two) times daily. 30 g 0   cyclobenzaprine (FLEXERIL) 5 MG tablet 5 mg 3 (three) times daily as needed.     Evolocumab (REPATHA SURECLICK) 140 MG/ML SOAJ Inject 1 pen (140 mg) subcutaneously every 14 days 6 mL 3   fluticasone (FLONASE) 50 MCG/ACT nasal spray Place 2 sprays into both nostrils daily. 16 g 1   furosemide (LASIX) 20 MG tablet Take 1 tablet (20 mg total) by mouth daily as needed (for shortness of breath or weight gain of 2 lbs overnight or 5 lbs in a week). 45 tablet 3   hyoscyamine (LEVSIN) 0.125 MG tablet Take 1 tablet (0.125 mg total) by mouth every 4 (four) hours as needed for cramping (diarrhea,nausea). 90 tablet 1   imipramine (TOFRANIL) 25 MG tablet Take 50 mg by mouth at bedtime. 60 tablet 1   isosorbide mononitrate (IMDUR) 60 MG 24 hr tablet Take 1.5 tablets (90 mg total) by mouth daily. 135 tablet 3   loratadine (CLARITIN) 10 MG tablet Take 1 tablet (10 mg total) by mouth daily. 10 tablet 0   methimazole (TAPAZOLE) 5 MG tablet Take 5 mg by mouth every other day.     methocarbamol (ROBAXIN) 500 MG tablet Take 1 tablet (500 mg total) by mouth every 8 (eight) hours as needed for muscle spasms. 30 tablet 0   metoCLOPramide (REGLAN) 5 MG tablet TAKE 1 TABLET BY MOUTH THREE TIMES DAILY BEFORE MEAL(S) 90 tablet 0   metoprolol succinate (TOPROL-XL) 100 MG 24 hr tablet Take 1 tablet (100 mg total) by mouth daily. Take with or immediately following a meal. 90 tablet 1    neomycin-polymyxin-hydrocortisone (CORTISPORIN) OTIC solution Place 3 drops into the right ear 4 (four) times daily. 10 mL 0   nitroGLYCERIN (NITROSTAT) 0.4 MG SL tablet DISSOLVE ONE TABLET UNDER THE TONGUE EVERY 5 MINUTES AS NEEDED FOR CHEST PAIN.  DO NOT EXCEED A TOTAL OF 3 DOSES IN 15 MINUTES 90 tablet 3   nystatin (MYCOSTATIN/NYSTOP) powder Apply 1 application topically 2 (two) times daily. Beneath both breasts 60 g 1   ondansetron (ZOFRAN) 4 MG tablet Take 1 tab by mouth every 6 hours as needed for nausea. 100 tablet 1   ondansetron (ZOFRAN-ODT) 8 MG disintegrating tablet Take 8 mg by mouth every 8 (eight) hours as needed for vomiting or nausea.     potassium chloride SA (KLOR-CON M20) 20 MEQ tablet Take 1 tablet by mouth once daily 30 tablet 11   rosuvastatin (CRESTOR) 40 MG tablet Take 1 tablet (40 mg total) by mouth daily. 90 tablet 3   SUMAtriptan (IMITREX) 50 MG tablet Take 1 tablet (50 mg total) by mouth every 2 (two) hours as needed for migraine. May repeat in 2 hours if headache persists or recurs. 10 tablet 5   traMADol (ULTRAM) 50 MG tablet Take 50 mg by mouth as needed for moderate pain.     valsartan-hydrochlorothiazide (DIOVAN HCT) 160-12.5 MG tablet Take 1 tablet by mouth daily.     XIIDRA 5 % SOLN Apply 1 drop to eye 2 (two) times daily.     doxycycline (VIBRA-TABS) 100 MG tablet Take 1 tablet (100 mg  total) by mouth 2 (two) times daily. 14 tablet 0   No facility-administered medications prior to visit.    Allergies  Allergen Reactions   Ace Inhibitors Cough   Celebrex [Celecoxib] Other (See Comments)    "makes me bleed"   Diovan [Valsartan]     angioedema    Review of Systems  Musculoskeletal:  Positive for joint pain (joint pain throughout, worse in left arm and leg) and neck pain.       Objective:    Physical Exam Constitutional:      General: She is not in acute distress.    Appearance: Normal appearance. She is well-developed.  HENT:     Head:  Normocephalic and atraumatic.     Right Ear: External ear normal.     Left Ear: External ear normal.  Eyes:     General: No scleral icterus. Neck:     Thyroid: No thyromegaly.  Cardiovascular:     Rate and Rhythm: Normal rate and regular rhythm.     Heart sounds: Normal heart sounds. No murmur heard. Pulmonary:     Effort: Pulmonary effort is normal. No respiratory distress.     Breath sounds: Normal breath sounds. No wheezing.  Musculoskeletal:     Cervical back: Neck supple.  Skin:    General: Skin is warm and dry.     Comments: Firm cystic nodule remains left cheek  Neurological:     Mental Status: She is alert and oriented to person, place, and time.  Psychiatric:        Mood and Affect: Mood normal.        Behavior: Behavior normal.        Thought Content: Thought content normal.        Judgment: Judgment normal.     BP 129/73 (BP Location: Right Arm, Patient Position: Sitting, Cuff Size: Small)   Pulse 88   Temp 98.2 F (36.8 C) (Oral)   Resp 16   Wt 234 lb (106.1 kg)   SpO2 100%   BMI 40.17 kg/m  Wt Readings from Last 3 Encounters:  11/02/22 234 lb (106.1 kg)  10/30/22 234 lb (106.1 kg)  10/12/22 234 lb (106.1 kg)       Assessment & Plan:  Arthralgia, unspecified joint Assessment & Plan: Will check ANA, ESR, Rheumatoid factor. If abnormal plan referral to rheumatology.   Orders: -     ANA -     Rheumatoid factor -     Sedimentation rate  Skin cyst Assessment & Plan: Remains from recent cellulitis. Will refer to dermatology to evaluate for excision.   Orders: -     Ambulatory referral to Dermatology     I,Rachel Rivera,acting as a scribe for Denise Fillers, NP.,have documented all relevant documentation on the behalf of Denise Fillers, NP,as directed by  Denise Fillers, NP while in the presence of Denise Fillers, NP.   I, Denise Fillers, NP, personally preformed the services described in this documentation.  All  medical record entries made by the scribe were at my direction and in my presence.  I have reviewed the chart and discharge instructions (if applicable) and agree that the record reflects my personal performance and is accurate and complete. 11/02/22   Denise Fillers, NP

## 2022-11-02 ENCOUNTER — Ambulatory Visit (INDEPENDENT_AMBULATORY_CARE_PROVIDER_SITE_OTHER): Payer: 59 | Admitting: Family

## 2022-11-02 ENCOUNTER — Telehealth: Payer: Self-pay | Admitting: Family

## 2022-11-02 VITALS — BP 129/73 | HR 88 | Temp 98.2°F | Resp 16 | Wt 234.0 lb

## 2022-11-02 DIAGNOSIS — L729 Follicular cyst of the skin and subcutaneous tissue, unspecified: Secondary | ICD-10-CM

## 2022-11-02 DIAGNOSIS — R768 Other specified abnormal immunological findings in serum: Secondary | ICD-10-CM

## 2022-11-02 DIAGNOSIS — M2550 Pain in unspecified joint: Secondary | ICD-10-CM

## 2022-11-02 LAB — SEDIMENTATION RATE: Sed Rate: 58 mm/hr — ABNORMAL HIGH (ref 0–30)

## 2022-11-02 NOTE — Telephone Encounter (Signed)
Due to heart history, I would like her to stop imitrex.  Let me know if she experiences migraines which are not improved with extra strength tylenol.

## 2022-11-02 NOTE — Assessment & Plan Note (Signed)
Remains from recent cellulitis. Will refer to dermatology to evaluate for excision.

## 2022-11-02 NOTE — Assessment & Plan Note (Signed)
>>  ASSESSMENT AND PLAN FOR ARTHRALGIA WRITTEN ON 11/02/2022 11:54 AM BY O'SULLIVAN, [place]a Hedman, NP  Will check ANA, ESR, Rheumatoid factor. If abnormal plan referral to rheumatology.

## 2022-11-02 NOTE — Assessment & Plan Note (Signed)
Will check ANA, ESR, Rheumatoid factor. If abnormal plan referral to rheumatology.

## 2022-11-03 LAB — RHEUMATOID FACTOR: Rheumatoid fact SerPl-aCnc: <10 IU/mL (ref <14)

## 2022-11-04 LAB — ANTI-NUCLEAR AB-TITER (ANA TITER)
ANA TITER: 1:320 {titer} — ABNORMAL HIGH
ANA TITER: 1:80 {titer} — ABNORMAL HIGH
ANA Titer 1: 1:80 {titer} — ABNORMAL HIGH

## 2022-11-04 LAB — ANA: Anti Nuclear Antibody (ANA): POSITIVE — AB

## 2022-11-05 NOTE — Telephone Encounter (Signed)
Patient notified of results and referral.  She reports she has not been taking the imitrex.

## 2022-11-05 NOTE — Telephone Encounter (Signed)
Also, her autoimmune testing is abnormal. This could explain her joint pain. I would like for her to see rheumatology. Referral placed.

## 2022-12-08 ENCOUNTER — Other Ambulatory Visit: Payer: Self-pay | Admitting: Family

## 2022-12-08 ENCOUNTER — Other Ambulatory Visit: Payer: Self-pay | Admitting: Internal Medicine

## 2022-12-13 ENCOUNTER — Other Ambulatory Visit: Payer: Self-pay | Admitting: Internal Medicine

## 2022-12-13 DIAGNOSIS — E0590 Thyrotoxicosis, unspecified without thyrotoxic crisis or storm: Secondary | ICD-10-CM

## 2022-12-13 NOTE — Telephone Encounter (Signed)
Patient has a lab appt 12/14/2022, there are No orders in.

## 2022-12-14 ENCOUNTER — Other Ambulatory Visit: Payer: 59

## 2022-12-19 ENCOUNTER — Other Ambulatory Visit: Payer: Self-pay | Admitting: Family

## 2023-01-14 ENCOUNTER — Other Ambulatory Visit: Payer: Self-pay | Admitting: Family

## 2023-01-15 ENCOUNTER — Ambulatory Visit: Payer: Self-pay | Admitting: Licensed Clinical Social Worker

## 2023-01-15 NOTE — Patient Outreach (Signed)
  Care Coordination  Initial Visit Note   01/15/2023 Name: [name] MRN: [id number] DOB: 05-16-1961  [name] is a 62 y.o. year old female who sees Sandford Craze, NP for primary care. I spoke with  [name] by phone today.  What matters to the patients health and wellness today?    Patient scheduled phone appointment to obtain additional information about the Care Coordination Program..   SDOH assessments and interventions completed:  No   Care Coordination Interventions:  No, not indicated   Follow up plan: Follow up call scheduled for 01/16/23    Encounter Outcome:  Pt. Visit Completed   Sammuel Hines, LCSW Social Work Care Coordination  [facility] [employer] [phone]

## 2023-01-15 NOTE — Patient Instructions (Signed)
  It was a pleasure speaking with you today. Per your request a Care Coordination phone appointment is scheduled 01/16/23  Sammuel Hines, LCSW Social Work Care Coordination  [phone]

## 2023-01-16 ENCOUNTER — Ambulatory Visit: Payer: Self-pay | Admitting: Licensed Clinical Social Worker

## 2023-01-16 ENCOUNTER — Other Ambulatory Visit: Payer: Self-pay | Admitting: Family

## 2023-01-16 NOTE — Patient Instructions (Signed)
Social Work Visit Information  Thank you for taking time to visit with me today. Please don't hesitate to contact me if I can be of assistance to you.   Following are the goals we discussed today:   Goals Addressed             This Visit's Progress    COMPLETED: Care Coordination . no follow up required       Activities and task to complete in order to accomplish goals.   Review brochure on Care Management Services   Complete Advance Directive packet,  Have advance directive notarized and provide a copy to provider office          Patient does not desire continued follow-up by social work. They will contact the office if needed  Please call the care guide team at [phone] if you need to cancel or reschedule your appointment.   The patient verbalized understanding of instructions, educational materials, and care plan provided today and DECLINED offer to receive copy of patient instructions, educational materials, and care plan.   Sammuel Hines, LCSW Social Work Care Coordination  [facility] [employer] [phone]

## 2023-01-16 NOTE — Patient Outreach (Signed)
  Care Coordination  Initial Visit Note   01/16/2023 Name: [name] MRN: [id number] DOB: 1961-04-25  [name] is a 62 y.o. year old female who sees Sandford Craze, NP for primary care. I spoke with  [name] by phone today.  What matters to the patients health and wellness today?    Patient reports no concerns or needs from Care Coordination team with health and wellness related to physical or mental heath.  However willing to review brochure .     Goals Addressed             This Visit's Progress    COMPLETED: Care Coordination . no follow up required       Activities and task to complete in order to accomplish goals.   Review brochure on Care Management Services   Complete Advance Directive packet,  Have advance directive notarized and provide a copy to provider office         SDOH assessments and interventions completed:  Yes  SDOH Interventions Today    Flowsheet Row Most Recent Value  SDOH Interventions   Food Insecurity Interventions Intervention Not Indicated  Housing Interventions Intervention Not Indicated  Transportation Interventions Intervention Not Indicated  Utilities Interventions Intervention Not Indicated  Financial Strain Interventions Intervention Not Indicated  Stress Interventions Intervention Not Indicated  Health Literacy Interventions Intervention Not Indicated       Care Coordination Interventions:  Yes, provided  Interventions Today    Flowsheet Row Most Recent Value  Chronic Disease   Chronic disease during today's visit Diabetes, Hypertension (HTN)  General Interventions   General Interventions Discussed/Reviewed General Interventions Reviewed  [reviewed care coordination program]  Education Interventions   Education Provided Provided Education, Provided Printed Education  Mental Health Interventions   Mental Health Discussed/Reviewed Mental Health Reviewed  Advanced Directive Interventions   Advanced  Directives Discussed/Reviewed Advanced Directives Discussed, Provided resource for acquiring and filling out documents       Follow up plan: No further intervention required.   Encounter Outcome:  Pt. Visit Completed   Sammuel Hines, LCSW Social Work Care Coordination  [facility] [employer] [phone]

## 2023-02-11 ENCOUNTER — Telehealth: Payer: Self-pay | Admitting: Family

## 2023-02-11 NOTE — Telephone Encounter (Signed)
Pt called to advise that she cannot see her rheumatologist until 9/27 but her joints ache really bad. She has tried tylenol, ibuprofen, aleve and it's still not touching the pain. Pt would like to speak to provider about what she can do.

## 2023-02-12 MED ORDER — PREDNISONE 10 MG PO TABS: ORAL_TABLET | ORAL | 0 refills | Status: DC

## 2023-02-12 NOTE — Telephone Encounter (Signed)
Patient notified of medication at pharmacy.  

## 2023-02-12 NOTE — Addendum Note (Signed)
Addended by: Sandford Craze on: 02/12/2023 08:18 AM   Modules accepted: Orders

## 2023-02-12 NOTE — Telephone Encounter (Signed)
I sent an rx for prednisone taper for her to try. Check sugar twice daily while on prednisone. Call if sugar >350.

## 2023-02-28 ENCOUNTER — Ambulatory Visit (HOSPITAL_BASED_OUTPATIENT_CLINIC_OR_DEPARTMENT_OTHER)
Admission: RE | Admit: 2023-02-28 | Discharge: 2023-02-28 | Disposition: A | Discharge status: Home / Self Care | Payer: 59 | Source: Ambulatory Visit | Attending: Family Medicine | Admitting: Family Medicine

## 2023-02-28 ENCOUNTER — Encounter: Payer: Self-pay | Admitting: Family Medicine

## 2023-02-28 ENCOUNTER — Ambulatory Visit (INDEPENDENT_AMBULATORY_CARE_PROVIDER_SITE_OTHER): Payer: 59 | Admitting: Family Medicine

## 2023-02-28 VITALS — BP 130/59 | HR 78 | Resp 18 | Ht 64.0 in | Wt 239.4 lb

## 2023-02-28 DIAGNOSIS — M5442 Lumbago with sciatica, left side: Secondary | ICD-10-CM

## 2023-02-28 DIAGNOSIS — M25552 Pain in left hip: Secondary | ICD-10-CM | POA: Diagnosis not present

## 2023-02-28 DIAGNOSIS — M47817 Spondylosis without myelopathy or radiculopathy, lumbosacral region: Secondary | ICD-10-CM | POA: Diagnosis not present

## 2023-02-28 DIAGNOSIS — M545 Low back pain, unspecified: Secondary | ICD-10-CM | POA: Diagnosis not present

## 2023-02-28 DIAGNOSIS — I878 Other specified disorders of veins: Secondary | ICD-10-CM | POA: Diagnosis not present

## 2023-02-28 DIAGNOSIS — M160 Bilateral primary osteoarthritis of hip: Secondary | ICD-10-CM | POA: Diagnosis not present

## 2023-02-28 DIAGNOSIS — M5430 Sciatica, unspecified side: Secondary | ICD-10-CM | POA: Diagnosis not present

## 2023-02-28 MED ORDER — ACETAMINOPHEN-CODEINE 300-30 MG PO TABS
1.0000 | ORAL_TABLET | Freq: Four times a day (QID) | ORAL | 0 refills | Status: AC | PRN
Start: 2023-02-28 — End: 2023-03-05

## 2023-02-28 NOTE — Patient Instructions (Signed)
You've already completed prednisone. You can use occasionally ibuprofen if tolerating. Will add a few days of Tylenol #3 for severe pain - use sparingly and do not drive while taking. Xrays today. Rest, ice, heat, massage, home exercises. Follow-up pending results. Keep upcoming rheumatology appointment.

## 2023-02-28 NOTE — Progress Notes (Signed)
Acute Office Visit  Subjective:     Patient ID: [name], female    DOB: Mar 16, 1961, 62 y.o.   MRN: [id number]  Chief Complaint  Patient presents with   Leg Pain    L leg pain aching, sharp pain  "Got real bad yesterday" Onset "about a month" Pt states she has tried heating pads and a round of prednisone however nothing is touching the pain       Patient is in today for acute left hip pain.   Discussed the use of AI scribe software for clinical note transcription with the patient, who gave verbal consent to proceed.  History of Present Illness   The patient, with a history of generalized joint aches and pains and abnormal lab work, presents with worsening left leg pain. The pain, which has been ongoing for a month, has significantly worsened over the past couple of days, to the point of causing tears. The patient denies any recent injury, fall, or any out of the ordinary activities. The pain is described as aching and throbbing, located deep in the joint of the left hip and lower back, and radiates down the leg. The patient reports that even the slightest movement, including breathing, exacerbates the pain.  The patient has recently completed a course of prednisone with no significant relief. They have also been managing the pain with ibuprofen and a heating pad, and have tried soaking in hot water, all with minimal effect. The patient has a scheduled appointment with a rheumatologist later in the month for polyarthralgia and positive ANA.  The patient also reports a history of arthritis, and has previously attended physical therapy and performed home stretches for joint pain. Despite these interventions, the patient reports that the pain is currently at a level of ten out of ten.           ROS All review of systems negative except what is listed in the HPI      Objective:    BP (!) 130/59 (BP Location: Left Arm, Patient Position: Sitting, Cuff Size: Large)   Pulse  78   Resp 18   Ht 5\' 4"  (1.626 m)   Wt 239 lb 6.4 oz (108.6 kg)   SpO2 99%   BMI 41.09 kg/m    Physical Exam Vitals reviewed.  Constitutional:      Appearance: Normal appearance.  Musculoskeletal:        General: No swelling or tenderness.     Right lower leg: No edema.     Left lower leg: No edema.     Comments: Left hip pain with abduction, flexion, extension, external rotation  Neurological:     Mental Status: She is alert and oriented to person, place, and time.  Psychiatric:        Mood and Affect: Mood normal.        Behavior: Behavior normal.        Thought Content: Thought content normal.        Judgment: Judgment normal.         No results found for any visits on 02/28/23.      Assessment & Plan:   Problem List Items Addressed This Visit       Active Problems   Low back pain   Relevant Medications   acetaminophen-codeine (TYLENOL #3) 300-30 MG tablet   Other Relevant Orders   DG HIP UNILAT W OR W/O PELVIS 2-3 VIEWS LEFT   DG Lumbar Spine Complete  Other Visit Diagnoses     Left hip pain    -  Primary   Relevant Medications   acetaminophen-codeine (TYLENOL #3) 300-30 MG tablet   Other Relevant Orders   DG HIP UNILAT W OR W/O PELVIS 2-3 VIEWS LEFT   DG Lumbar Spine Complete         Left Hip and Lower Back Pain Severe, aching and throbbing pain in the left hip and lower back, radiating down the leg. No history of injury or trauma. No numbness or tingling. Pain exacerbated by certain movements and positions. No relief with prednisone or ibuprofen. Possible arthritis or rheumatoid condition. -Order X-ray of lower back and hip. -Continue ibuprofen as needed. -Prescribe Tylenol #3 for severe pain, instruct patient to use sparingly and avoid driving or alcohol while taking this medication. -Advise patient to continue with home exercises. -Follow-up after X-ray results are available.      Meds ordered this encounter  Medications    acetaminophen-codeine (TYLENOL #3) 300-30 MG tablet    Sig: Take 1 tablet by mouth every 6 (six) hours as needed for up to 5 days for severe pain.    Dispense:  20 tablet    Refill:  0    Order Specific Question:   Supervising Provider    Answer:   Danise Edge A [[id number]]    Return if symptoms worsen or fail to improve.  Clayborne Dana, NP

## 2023-03-11 ENCOUNTER — Other Ambulatory Visit: Payer: Self-pay | Admitting: Internal Medicine

## 2023-03-21 NOTE — Progress Notes (Signed)
Office Visit Note  Patient: [name]             Date of Birth: 1961/06/03           MRN: [id number]             PCP: Sandford Craze, NP Referring: Sandford Craze, NP Visit Date: 03/22/2023  Subjective:  New Patient (Initial Visit) (Patient states she has joint pain all over. )   History of Present Illness: [name] is a 62 y.o. female here for evaluation of joint pain with positive ANA and ESR.  Medical history is also significant for thyrotoxicosis on treatment with methimazole, esophageal dysphagia and stricture, gastroparesis, and type 2 diabetes.  Some degree of chronic joint pain most prominent in her low back and hips that is not new but symptoms increased in the past year.  She develops area of numbness on the lateral portion of the thigh that occurs after prolonged standing.  Only occasional pain down the back of the leg but she does state very sore in the low back and this gets worse after prolonged walking or standing.  She has morning stiffness lasting about an hour.  Not associated with any significant new illness or injury.  She does have a previous history of left knee injury that was repaired arthroscopically many years ago and gets some pain with increased use in that area.  Does not take any daily medications for joint pain.  She previously saw orthopedic specialist with back steroid injections that helped for a short duration.  She previously saw physical therapy years ago for back pain but did not feel like the treatment helped. Besides joint problems she does describe chronic eye dryness.  Was not found to have any Graves ophthalmopathy with previous evaluations.  Denies oral or nasal ulcers, Raynaud's syndrome, cervical or axillary lymphadenopathy, or abnormal bruising or bleeding.  She has patchy hyperpigmented spots on the distal arms and legs both sides she described as her birthmark is not new or any recent change. Chronic dysphagia previously  seeing Dr. Adela Lank imaging and esophageal manometry consistent with hypercontractile esophagus. She is currently off the methimazole due to running out of medication just recently.  From chart review looks like she was recommended to have repeat thyroid parameters checked in July but these labs were never collected sounds like she was not aware or does not member at this time, has been taking 5 mg once daily refill was requested 9/16.  Labs reviewed ANA 1:80 speckled RF neg ESR 58   Activities of Daily Living:  Patient reports morning stiffness for 1 hour.   Patient Reports nocturnal pain.  Difficulty dressing/grooming: Denies Difficulty climbing stairs: Denies Difficulty getting out of chair: Denies Difficulty using hands for taps, buttons, cutlery, and/or writing: Denies  Review of Systems  Constitutional:  Negative for fatigue.  HENT:  Negative for mouth sores and mouth dryness.   Eyes:  Positive for dryness.  Respiratory:  Negative for shortness of breath.   Cardiovascular:  Negative for chest pain and palpitations.  Gastrointestinal:  Negative for blood in stool, constipation and diarrhea.  Endocrine: Positive for increased urination.  Genitourinary:  Negative for involuntary urination.  Musculoskeletal:  Positive for joint pain, joint pain, joint swelling, myalgias, muscle weakness, morning stiffness, muscle tenderness and myalgias. Negative for gait problem.  Skin:  Negative for color change, rash, hair loss and sensitivity to sunlight.  Allergic/Immunologic: Positive for susceptible to infections.  Neurological:  Negative for  dizziness and headaches.  Hematological:  Negative for swollen glands.  Psychiatric/Behavioral:  Negative for depressed mood and sleep disturbance. The patient is not nervous/anxious.     PMFS History:  Patient Active Problem List   Diagnosis Date Noted   Positive ANA (antinuclear antibody) 03/22/2023   Sedimentation rate elevation 03/22/2023    Skin cyst 11/02/2022   Arthralgia 11/02/2022   Bulging of lumbar intervertebral disc 09/24/2022   Lumbar radiculopathy 09/24/2022   Left arm pain 08/14/2022   Otitis externa of right ear 06/13/2022   Arthritis 05/11/2022   Edema 01/12/2022   Controlled type 2 diabetes mellitus without complication, without long-term current use of insulin (HCC) 12/01/2021   Facial abscess 09/15/2021   Non-functioning tympanostomy tube 08/25/2021   Cough 08/25/2021   Bilateral impacted cerumen 06/30/2021   Coronary artery disease of native heart with stable angina pectoris (HCC) 06/30/2021   Intertrigo 05/26/2021   DOE (dyspnea on exertion) 12/30/2020   Esophageal dysphagia    Hypercontractile esophagus    Colitis 02/04/2018   Aortic atherosclerosis (HCC) 07/17/2017   Chest pain 07/16/2017   Insomnia 01/12/2017   Breast cyst 06/09/2012   Hyperthyroidism 02/29/2012   Unspecified constipation 04/18/2011   Can't get food down 04/18/2011   Ulnar neuropathy 03/30/2011   Low back pain 10/18/2010   Fibromyalgia 07/20/2009   Hyperlipidemia 05/26/2009   Vitamin D deficiency 05/23/2009   Polyarthralgia 04/12/2009   Iron deficiency anemia 10/25/2008   GERD 10/25/2008   LIVER HEMANGIOMA 01/01/2008   ESOPHAGEAL STRICTURE 01/01/2008   Gastroparesis 01/01/2008   Anemia 12/08/2007   Allergic rhinitis 12/08/2007   Migraine headache 08/11/2007   HIATAL HERNIA 08/11/2007   OVARIAN CYST 08/11/2007   Essential hypertension 05/06/2007    Past Medical History:  Diagnosis Date   Allergy    allergic rhinitis   Arthritis    Atypical chest pain    Constipation    Cyst, ovarian    Diabetes mellitus, type II (HCC)    Elevated alkaline phosphatase level    Endometriosis    Esophageal stricture    Fatty liver    Gastroparesis    GERD (gastroesophageal reflux disease)    Heart murmur    Hepatic hemangioma    Hyperlipidemia    Hypertension    Hypertrophic condition of skin    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 1/08- benign   Iron deficiency anemia    Migraine headache    Non-compliance    Peptic ulcer disease    Thyroid disease     Family History  Problem Relation Age of Onset   Hypertension Mother    Rheum arthritis Mother    Hypertension Father    Diabetes Father    Prostate cancer Father    Kidney disease Father    Diabetes Sister    Fibromyalgia Sister    Diabetes Maternal Grandmother    Lung cancer Maternal Grandfather    Arthritis Brother    Migraines Brother    CAD Brother    Fibromyalgia Sister    Migraines Sister    Colon cancer Neg Hx    Thyroid disease Neg Hx    Colon polyps Neg Hx    Past Surgical History:  Procedure Laterality Date   ABDOMINAL EXPLORATION SURGERY     w/bso    BREAST BIOPSY     CARDIAC CATHETERIZATION  2009   mild non obstructive CAD   CHOLECYSTECTOMY     COLONOSCOPY     ESOPHAGEAL MANOMETRY N/A 08/01/2020   Procedure:  ESOPHAGEAL MANOMETRY (EM);  Surgeon: Benancio Deeds, MD;  Location: [facility];  Service: Gastroenterology;  Laterality: N/A;   KNEE SURGERY  2005    left knee   LEFT HEART CATH AND CORONARY ANGIOGRAPHY N/A 07/17/2017   Procedure: LEFT HEART CATH AND CORONARY ANGIOGRAPHY;  Surgeon: Swaziland, Peter M, MD;  Location: [facility] IN[facility]ASIVE CV LAB;  Service: Cardiovascular;  Laterality: N/A;   LEFT HEART CATH AND CORONARY ANGIOGRAPHY N/A 09/01/2021   Procedure: LEFT HEART CATH AND CORONARY ANGIOGRAPHY;  Surgeon: Kathleene Hazel, MD;  Location: [facility] INVASIVE CV LAB;  Service: Cardiovascular;  Laterality: N/A;   POLYPECTOMY     TOTAL ABDOMINAL HYSTERECTOMY     UPPER GASTROINTESTINAL ENDOSCOPY     Social History   Social History Narrative   Widowed   Has 2 grown children.  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    Former Smoker Quit tobacco in 1996.  She was a pack a day smoker for approximately 10 years.    Alcohol use-yes: Social    Daily Caffeine Use:6 pack of pepsi  daily     Illicit Drug Use - no    Patient does not get regular exercise.       Smoking Status:  quit   Immunization History  Administered Date(s) Administered   Fluad Quad(high Dose 65+) 03/31/2019   Influenza Split 04/30/2011   Influenza Whole 04/19/2006, 04/17/2007, 03/08/2009, 03/01/2010   Influenza, Quadrivalent, Recombinant, Inj, Pf 03/31/2019   Influenza, Seasonal, Injecte, Preservative Fre 05/30/2012   Influenza,inj,Quad PF,6+ Mos 02/15/2014, 03/08/2015, 04/30/2016, 04/24/2017, 04/04/2018, 06/10/2020   Influenza-Unspecified 02/23/2021   PFIZER(Purple Top)SARS-COV-2 Vaccination 09/07/2019, 09/29/2019, 05/20/2020   Pfizer Covid-19 Vaccine Bivalent Booster 56yrs & up 02/23/2021   Pneumococcal Conjugate-13 04/04/2018   Pneumococcal Polysaccharide-23 03/08/2009, 09/23/2020   Td 05/17/1999   Tdap 04/30/2011   Zoster Recombinant(Shingrix) 03/31/2019, 08/02/2019     Objective: Vital Signs: BP (!) 147/74 (BP Location: Right Arm, Patient Position: Sitting, Cuff Size: Normal)   Pulse 76   Resp 14   Ht 5\' 5"  (1.651 m)   Wt 235 lb (106.6 kg)   BMI 39.11 kg/m    Physical Exam Eyes:     Conjunctiva/sclera: Conjunctivae normal.  Cardiovascular:     Rate and Rhythm: Normal rate and regular rhythm.  Pulmonary:     Effort: Pulmonary effort is normal.     Breath sounds: Normal breath sounds.  Lymphadenopathy:     Cervical: No cervical adenopathy.  Skin:    General: Skin is warm and dry.  Neurological:     Mental Status: She is alert.  Psychiatric:        Mood and Affect: Mood normal.      Musculoskeletal Exam:  Neck full ROM no tenderness Shoulders full ROM no tenderness or swelling Elbows full ROM no tenderness or swelling Wrists full ROM no tenderness or swelling Fingers full ROM no tenderness or swelling Low back midline and paraspinal tenderness, does not extend over mid back, also tender over gluteal region greater trochanter No radiation provoked with SLR, FADIR  slightly restricted soft end point and lateral pain provoked Knees full ROM no tenderness or swelling   Investigation: No additional findings.  Imaging: DG Lumbar Spine Complete  Result Date: 03/11/2023 CLINICAL DATA:  Acute left-sided low back pain with left-sided sciatica. Left hip pain. EXAM: LUMBAR SPINE - COMPLETE 4+ VIEW COMPARISON:  Radiographs 04/07/2021 FINDINGS: Five non-rib-bearing lumbar vertebra. Lumbar alignment is normal. Vertebral body heights  are normal. Anterior spurring at multiple levels most prominent at L5-S1. The disc spaces are preserved. No evidence of focal bone lesion, fracture or pars defects. Aortic atherosclerosis. IMPRESSION: Mild spondylosis with anterior spurring most prominent at L5-S1. Electronically Signed   By: Narda Rutherford M.D.   On: 03/11/2023 06:54   DG HIP UNILAT W OR W/O PELVIS 2-3 VIEWS LEFT  Result Date: 03/11/2023 CLINICAL DATA:  Acute on chronic left hip pain, worse over the last week. Acute left-sided low back pain with left-sided sciatica. EXAM: DG HIP (WITH OR WITHOUT PELVIS) 2-3V LEFT COMPARISON:  None Available. FINDINGS: No fracture. Normal alignment. Hip joint space is preserved. There is slight acetabular spurring. No evidence of erosion, avascular necrosis or focal bone abnormality. Bony pelvis including pubic rami are intact. Pubic symphysis and sacroiliac joints are congruent. There is mild bilateral sacroiliac degenerative change. Pelvic phleboliths. IMPRESSION: Mild degenerative change of the left hip and sacroiliac joints. Electronically Signed   By: Narda Rutherford M.D.   On: 03/11/2023 06:52    Recent Labs: Lab Results  Component Value Date   WBC 8.7 08/14/2022   HGB 12.1 08/14/2022   PLT 263 08/14/2022   NA 137 08/14/2022   K 3.8 08/14/2022   CL 109 08/14/2022   CO2 24 08/14/2022   GLUCOSE 121 (H) 08/14/2022   BUN 19 08/14/2022   CREATININE 0.90 08/14/2022   BILITOT <0.2 10/12/2022   ALKPHOS 137 (H) 10/19/2022   AST 20  10/12/2022   ALT 32 10/12/2022   PROT 6.8 10/12/2022   ALBUMIN 4.3 10/12/2022   CALCIUM 9.0 08/14/2022   GFRAA >60 04/06/2019    Speciality Comments: No specialty comments available.  Procedures:  No procedures performed Allergies: Ace inhibitors, Celebrex [celecoxib], and Diovan [valsartan]   Assessment / Plan:     Visit Diagnoses: Positive ANA (antinuclear antibody) - Plan: RNP Antibody, Anti-Smith antibody, Sjogrens syndrome-A extractable nuclear antibody, Sjogrens syndrome-B extractable nuclear antibody, Anti-DNA antibody, double-stranded, Anti-scleroderma antibody, C3 and C4  Positive ANA at fairly low titer does not describe specific new criteria for connective tissue disease.  Does have joint pain and stiffness in multiple areas some with known underlying osteoarthritis to account for it.  Known thyroid disease despite no particular antibody profile may also be associated with positive ANA and significant minority of cases.  Checking antibody profile also serum complements for other evidence of disease activity.   Polyarthralgia Sedimentation rate elevation - Plan: Sedimentation rate, Cyclic citrul peptide antibody, IgG  Currently I do not see a clear explanation for the elevated sedimentation rate.  Will recheck this today to see if it is the same or any trend over time.  I do not think there are major structural changes to account for hand pain and stiffness.  Also checking CCP antibody for possible rheumatoid arthritis although she describes more axial joint distribution.  Hyperthyroidism - Plan: TSH, T3, free, T4, free  Looks like there was unintentional interruption to her methimazole treatment but also just seems like she may have not been on track with the recommended partial dose tapering by endocrine clinic.  Will check thyroid panel labs today once these result will update the patient and contact Dr. Elvera Lennox regarding management.   Orders: Orders Placed This Encounter   Procedures   TSH   T3, free   T4, free   Sedimentation rate   RNP Antibody   Anti-Smith antibody   Sjogrens syndrome-A extractable nuclear antibody   Sjogrens syndrome-B extractable nuclear antibody  Anti-DNA antibody, double-stranded   Anti-scleroderma antibody   C3 and C4   Cyclic citrul peptide antibody, IgG   No orders of the defined types were placed in this encounter.   Follow-Up Instructions: Return in about 4 weeks (around 04/19/2023) for New pt ANA/OA f/u 108mo.   Fuller Plan, MD  Note - This record has been created using [employer].  Chart creation errors have been sought, but may not always  have been located. Such creation errors do not reflect on  the standard of medical care.

## 2023-03-22 ENCOUNTER — Encounter: Payer: Self-pay | Admitting: Internal Medicine

## 2023-03-22 ENCOUNTER — Ambulatory Visit: Payer: 59 | Attending: Internal Medicine | Admitting: Internal Medicine

## 2023-03-22 VITALS — BP 147/74 | HR 76 | Resp 14 | Ht 65.0 in | Wt 235.0 lb

## 2023-03-22 DIAGNOSIS — M2550 Pain in unspecified joint: Secondary | ICD-10-CM | POA: Diagnosis not present

## 2023-03-22 DIAGNOSIS — E0590 Thyrotoxicosis, unspecified without thyrotoxic crisis or storm: Secondary | ICD-10-CM | POA: Diagnosis not present

## 2023-03-22 DIAGNOSIS — R700 Elevated erythrocyte sedimentation rate: Secondary | ICD-10-CM | POA: Diagnosis not present

## 2023-03-22 DIAGNOSIS — R768 Other specified abnormal immunological findings in serum: Secondary | ICD-10-CM | POA: Diagnosis not present

## 2023-03-22 NOTE — Patient Instructions (Signed)
The lateral thigh numbness sounds like Meralgia Paresthetica which is an impingement of a superficial nerve. This is a benign condition it can be worse with tight fitting waistlines or belts or with weight gain.  I am rechecking the inflammatory marker test also for specific antibodies associated with the ANA.  I will go ahead and recheck the thyroid function tests that looks like were recommended from July based on chart review plan to forward the results to Dr. Elvera Lennox.

## 2023-03-26 ENCOUNTER — Telehealth: Payer: Self-pay

## 2023-03-26 ENCOUNTER — Other Ambulatory Visit: Payer: Self-pay | Admitting: Family

## 2023-03-26 DIAGNOSIS — E0590 Thyrotoxicosis, unspecified without thyrotoxic crisis or storm: Secondary | ICD-10-CM

## 2023-03-26 LAB — ANTI-SCLERODERMA ANTIBODY: Scleroderma (Scl-70) (ENA) Antibody, IgG: <1 AI

## 2023-03-26 LAB — ANTI-DNA ANTIBODY, DOUBLE-STRANDED: ds DNA Ab: <1 [IU]/mL

## 2023-03-26 LAB — SEDIMENTATION RATE: Sed Rate: 31 mm/h — ABNORMAL HIGH (ref 0–30)

## 2023-03-26 LAB — ANTI-SMITH ANTIBODY: ENA SM Ab Ser-aCnc: <1 AI

## 2023-03-26 LAB — SJOGRENS SYNDROME-A EXTRACTABLE NUCLEAR ANTIBODY: SSA (Ro) (ENA) Antibody, IgG: <1 AI

## 2023-03-26 LAB — T4, FREE: Free T4: 1.1 ng/dL (ref 0.8–1.8)

## 2023-03-26 LAB — C3 AND C4
C3 Complement: 198 mg/dL — ABNORMAL HIGH (ref 83–193)
C4 Complement: 35 mg/dL (ref 15–57)

## 2023-03-26 LAB — CYCLIC CITRUL PEPTIDE ANTIBODY, IGG: Cyclic Citrullin Peptide Ab: <16 U

## 2023-03-26 LAB — SJOGRENS SYNDROME-B EXTRACTABLE NUCLEAR ANTIBODY: SSB (La) (ENA) Antibody, IgG: <1 AI

## 2023-03-26 LAB — RNP ANTIBODY: Ribonucleic Protein(ENA) Antibody, IgG: <1 AI

## 2023-03-26 LAB — TSH: TSH: 1.18 m[IU]/L (ref 0.40–4.50)

## 2023-03-26 LAB — T3, FREE: T3, Free: 3.3 pg/mL (ref 2.3–4.2)

## 2023-03-26 MED ORDER — METHIMAZOLE 5 MG PO TABS: ORAL_TABLET | ORAL | 3 refills | Status: DC

## 2023-03-26 NOTE — Telephone Encounter (Signed)
-----   Message from [name] sent at 03/26/2023 12:06 PM EDT ----- Can you please call pt.:  Patient's thyroid tests are excellent.  Please continue the same dose of methimazole for now.

## 2023-03-26 NOTE — Telephone Encounter (Signed)
Results given per patient new RX needed to be sent, stated pharmacy would not fill on her last request. Will send over RX

## 2023-04-02 ENCOUNTER — Telehealth: Payer: Self-pay

## 2023-04-02 NOTE — Progress Notes (Signed)
Sedimentation rate is 31 much better compared to 58 at 5 months ago. The antibody tests for the positive ANA were all negative and the test for rheumatoid arthritis was also negative.

## 2023-04-02 NOTE — Telephone Encounter (Signed)
See lab note for details.  

## 2023-04-02 NOTE — Telephone Encounter (Signed)
Patient called in regards to lab results from 03/22/2023. Patient states Dr. Charlean Sanfilippo office called her in regards to the thyroid results from that visit that Dr. Dimple Casey ordered but patient is inquiring about the rest of the results. Patient is scheduled for a follow up on 04/26/2023 but would like to know the results as Dr. Charlean Sanfilippo office has called her about some of them. Thanks!

## 2023-04-02 NOTE — Telephone Encounter (Signed)
Now addressed in associated result note.

## 2023-04-14 ENCOUNTER — Other Ambulatory Visit: Payer: Self-pay | Admitting: Family

## 2023-04-15 ENCOUNTER — Telehealth: Payer: Self-pay | Admitting: Family

## 2023-04-15 NOTE — Telephone Encounter (Signed)
Patient's appointment moved to tomorrow afternoon

## 2023-04-15 NOTE — Telephone Encounter (Signed)
Pt said that she has been having diarrhea and vomiting off and on. Pt said she eats and it goes straight through her. Pt said she is nauseous so has no appetite. She wanted to  know if there is something that can be prescribed or something that she could take to help. Pt requested appt on Friday, did not want to see anyone sooner. Please call to advise if provider recommends something so that appt is not needed.

## 2023-04-16 ENCOUNTER — Ambulatory Visit (INDEPENDENT_AMBULATORY_CARE_PROVIDER_SITE_OTHER): Payer: 59 | Admitting: Family

## 2023-04-16 VITALS — BP 144/70 | HR 52 | Temp 97.9°F | Resp 16 | Wt 238.0 lb

## 2023-04-16 DIAGNOSIS — R197 Diarrhea, unspecified: Secondary | ICD-10-CM | POA: Diagnosis not present

## 2023-04-16 DIAGNOSIS — R252 Cramp and spasm: Secondary | ICD-10-CM

## 2023-04-16 DIAGNOSIS — M2550 Pain in unspecified joint: Secondary | ICD-10-CM

## 2023-04-16 NOTE — Patient Instructions (Signed)
VISIT SUMMARY:  During today's visit, we discussed your ongoing issues with diarrhea, hand cramping, and shoulder pain. We have outlined a plan to address each of these concerns and will follow up in two weeks to monitor your progress.  YOUR PLAN:  -CHRONIC DIARRHEA: Chronic diarrhea is a condition where you have frequent, loose, or watery stools for an extended period. We will collect stool samples to check for bacterial and parasitic infections and perform basic lab tests, including checking your electrolytes. If your symptoms persist and the lab results are normal, we may consider a CT scan to rule out conditions like diverticulitis.  -MUSCLE CRAMPING: Muscle cramping can be caused by various factors, including electrolyte imbalances, which may be related to your chronic diarrhea. We will check your electrolyte levels and recommend that you continue following up with your rheumatologist.  -SHOULDER PAIN/JOINT PAIN: Shoulder pain can be due to various reasons, including osteoarthritis. Since you have not found relief from previous treatments, we recommend taking Tylenol daily for pain management. If the pain continues, we may refer you to an orthopedic specialist.  INSTRUCTIONS:  Please follow up in two weeks or sooner if your symptoms worsen. Make sure to complete the stool sample collection and lab tests as soon as possible.

## 2023-04-16 NOTE — Progress Notes (Signed)
Subjective:     Patient ID: [name], female    DOB: Sep 09, 1960, 62 y.o.   MRN: [id number]  Chief Complaint  Patient presents with   Diarrhea    Patient reports having diarrhea from Friday to Sunday. Still having stomach pain   Hand Pain    Complains of having cramps on her hands.    HPI  Discussed the use of AI scribe software for clinical note transcription with the patient, who gave verbal consent to proceed.  History of Present Illness   The patient, with a history of gastroparesis, presents with a month-long history of diarrhea and hand cramping. She reports that 'whatever I eat here lately just goes right through me.' She denies recent travel or antibiotic use. She has not noticed any specific food triggers and reports a decreased appetite because 'everything I eat don't taste kind of funny to me.' She also reports abdominal pain across the left upper quadrant.  In addition to the diarrhea, she has been experiencing hand cramping. She denies any numbness in the hands. She also reports shoulder pain that has been 'going into spasming real bad' and radiating down the arm. She has previously seen a rheumatologist for this issue, who found no abnormalities.          Health Maintenance Due  Topic Date Due   OPHTHALMOLOGY EXAM  Never done   FOOT EXAM  05/30/2013   DTaP/Tdap/Td (3 - Td or Tdap) 04/29/2021   HEMOGLOBIN A1C  08/06/2022   MAMMOGRAM  09/29/2022   Diabetic kidney evaluation - Urine ACR  12/02/2022   INFLUENZA VACCINE  01/24/2023   COVID-19 Vaccine (5 - 2023-24 season) 02/24/2023   Medicare Annual Wellness (AWV)  06/09/2023    Past Medical History:  Diagnosis Date   Allergy    allergic rhinitis   Arthritis    Atypical chest pain    Constipation    Cyst, ovarian    Diabetes mellitus, type II (HCC)    Elevated alkaline phosphatase level    Endometriosis    Esophageal stricture    Fatty liver    Gastroparesis    GERD (gastroesophageal reflux  disease)    Heart murmur    Hepatic hemangioma    Hyperlipidemia    Hypertension    Hypertrophic condition of skin    acrokeratoelastoidosis- s/p 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 1/08- benign   Iron deficiency anemia    Migraine headache    Non-compliance    Peptic ulcer disease    Thyroid disease     Past Surgical History:  Procedure Laterality Date   ABDOMINAL EXPLORATION SURGERY     w/bso    BREAST BIOPSY     CARDIAC CATHETERIZATION  2009   mild non obstructive CAD   CHOLECYSTECTOMY     COLONOSCOPY     ESOPHAGEAL MANOMETRY N/A 08/01/2020   Procedure: ESOPHAGEAL MANOMETRY (EM);  Surgeon: Benancio Deeds, MD;  Location: [facility];  Service: Gastroenterology;  Laterality: N/A;   KNEE SURGERY  2005    left knee   LEFT HEART CATH AND CORONARY ANGIOGRAPHY N/A 07/17/2017   Procedure: LEFT HEART CATH AND CORONARY ANGIOGRAPHY;  Surgeon: Swaziland, Peter M, MD;  Location: [facility] INVASIVE CV LAB;  Service: Cardiovascular;  Laterality: N/A;   LEFT HEART CATH AND CORONARY ANGIOGRAPHY N/A 09/01/2021   Procedure: LEFT HEART CATH AND CORONARY ANGIOGRAPHY;  Surgeon: Kathleene Hazel, MD;  Location: [facility] INVASIVE CV LAB;  Service: Cardiovascular;  Laterality: N/A;   POLYPECTOMY  TOTAL ABDOMINAL HYSTERECTOMY     UPPER GASTROINTESTINAL ENDOSCOPY      Family History  Problem Relation Age of Onset   Hypertension Mother    Rheum arthritis Mother    Hypertension Father    Diabetes Father    Prostate cancer Father    Kidney disease Father    Diabetes Sister    Fibromyalgia Sister    Diabetes Maternal Grandmother    Lung cancer Maternal Grandfather    Arthritis Brother    Migraines Brother    CAD Brother    Fibromyalgia Sister    Migraines Sister    Colon cancer Neg Hx    Thyroid disease Neg Hx    Colon polyps Neg Hx     Social History   Socioeconomic History   Marital status: Widowed    Spouse name: Not on file   Number of children: 2   Years of education: Not on file   Highest  education level: Not on file  Occupational History   Occupation: DISABILITY    Employer: UNEMPLOYED  Tobacco Use   Smoking status: Former    Current packs/day: 0.00    Average packs/day: 0.5 packs/day for 18.0 years (9.0 ttl pk-yrs)    Types: Cigarettes    Start date: 07/29/1977    Quit date: 06/25/1994    Years since quitting: 28.8   Smokeless tobacco: Never   Tobacco comments:    quit 19 years ago  Vaping Use   Vaping status: Never Used  Substance and Sexual Activity   Alcohol use: Yes    Alcohol/week: 0.0 standard drinks of alcohol    Comment: social drinker   Drug use: No   Sexual activity: Not Currently  Other Topics Concern   Not on file  Social History Narrative   Widowed   Has 2 grown children.  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    Former Smoker Quit tobacco in 1996.  She was a pack a day smoker for approximately 10 years.    Alcohol use-yes: Social    Daily Caffeine Use:6 pack of pepsi daily     Illicit Drug Use - no    Patient does not get regular exercise.       Smoking Status:  quit   Social Determinants of Health   Financial Resource Strain: Low Risk  (01/16/2023)   Overall Financial Resource Strain (CARDIA)    Difficulty of Paying Living Expenses: Not hard at all  Food Insecurity: No Food Insecurity (01/16/2023)   Hunger Vital Sign    Worried About Running Out of Food in the Last Year: Never true    Ran Out of Food in the Last Year: Never true  Transportation Needs: No Transportation Needs (01/16/2023)   PRAPARE - Administrator, Civil Service (Medical): No    Lack of Transportation (Non-Medical): No  Physical Activity: Insufficiently Active (06/02/2021)   Exercise Vital Sign    Days of Exercise per Week: 2 days    Minutes of Exercise per Session: 30 min  Stress: No Stress Concern Present (01/16/2023)   [employer] of Occupational Health - Occupational Stress Questionnaire    Feeling of Stress  : Not at all  Social Connections: Moderately Isolated (06/02/2021)   Social Connection and Isolation Panel [NHANES]    Frequency of Communication with Friends and Family: More than three times a week    Frequency of Social Gatherings with Friends and Family: More than  three times a week    Attends Religious Services: More than 4 times per year    Active Member of Clubs or Organizations: No    Attends Banker Meetings: Never    Marital Status: Widowed  Intimate Partner Violence: Not At Risk (06/08/2022)   Humiliation, Afraid, Rape, and Kick questionnaire    Fear of Current or Ex-Partner: No    Emotionally Abused: No    Physically Abused: No    Sexually Abused: No    Outpatient Medications Prior to Visit  Medication Sig Dispense Refill   albuterol (VENTOLIN HFA) 108 (90 Base) MCG/ACT inhaler INHALE 2 PUFFS BY MOUTH 4 TIMES DAILY 9 g 0   alclomethasone (ACLOVATE) 0.05 % cream Apply topically 2 (two) times daily as needed (Rash). 180 g 3   amLODipine (NORVASC) 10 MG tablet Take 1 tablet by mouth once daily 90 tablet 0   aspirin 81 MG chewable tablet Chew 1 tablet (81 mg total) by mouth daily.     Cholecalciferol (VITAMIN D3) 75 MCG (3000 UT) TABS Take 1 tablet by mouth daily. 30 tablet    clobetasol (TEMOVATE) 0.05 % external solution Apply 1 application topically 2 (two) times daily. 50 mL 6   clotrimazole-betamethasone (LOTRISONE) cream Apply 1 application topically 2 (two) times daily. 30 g 0   cyclobenzaprine (FLEXERIL) 5 MG tablet 5 mg 3 (three) times daily as needed.     Evolocumab (REPATHA SURECLICK) 140 MG/ML SOAJ Inject 1 pen (140 mg) subcutaneously every 14 days 6 mL 3   fluticasone (FLONASE) 50 MCG/ACT nasal spray Place 2 sprays into both nostrils daily. 16 g 1   furosemide (LASIX) 20 MG tablet Take 1 tablet (20 mg total) by mouth daily as needed (for shortness of breath or weight gain of 2 lbs overnight or 5 lbs in a week). 45 tablet 3   hyoscyamine (LEVSIN) 0.125 MG  tablet Take 1 tablet (0.125 mg total) by mouth every 4 (four) hours as needed for cramping (diarrhea,nausea). 90 tablet 1   imipramine (TOFRANIL) 25 MG tablet Take 50 mg by mouth at bedtime. 60 tablet 1   isosorbide mononitrate (IMDUR) 60 MG 24 hr tablet TAKE 1 & 1/2 (ONE & ONE-HALF) TABLETS BY MOUTH ONCE DAILY 135 tablet 3   loratadine (CLARITIN) 10 MG tablet Take 1 tablet (10 mg total) by mouth daily. 10 tablet 0   methimazole (TAPAZOLE) 5 MG tablet Take as directed by MD 30 tablet 3   methocarbamol (ROBAXIN) 500 MG tablet Take 1 tablet (500 mg total) by mouth every 8 (eight) hours as needed for muscle spasms. 30 tablet 0   metoCLOPramide (REGLAN) 5 MG tablet TAKE 1 TABLET BY MOUTH THREE TIMES DAILY BEFORE MEAL(S) 90 tablet 0   metoprolol succinate (TOPROL-XL) 100 MG 24 hr tablet Take 1 tablet (100 mg total) by mouth daily. Take with or immediately following a meal. 90 tablet 1   neomycin-polymyxin-hydrocortisone (CORTISPORIN) OTIC solution Place 3 drops into the right ear 4 (four) times daily. 10 mL 0   nitroGLYCERIN (NITROSTAT) 0.4 MG SL tablet DISSOLVE ONE TABLET UNDER THE TONGUE EVERY 5 MINUTES AS NEEDED FOR CHEST PAIN.  DO NOT EXCEED A TOTAL OF 3 DOSES IN 15 MINUTES 90 tablet 3   nystatin (MYCOSTATIN/NYSTOP) powder Apply 1 application topically 2 (two) times daily. Beneath both breasts 60 g 1   ondansetron (ZOFRAN) 4 MG tablet Take 1 tab by mouth every 6 hours as needed for nausea. 100 tablet 1   ondansetron (  ZOFRAN-ODT) 8 MG disintegrating tablet Take 8 mg by mouth every 8 (eight) hours as needed for vomiting or nausea.     potassium chloride SA (KLOR-CON M20) 20 MEQ tablet Take 1 tablet by mouth once daily 30 tablet 11   predniSONE (DELTASONE) 10 MG tablet 4 tabs by mouth once daily for 2 days, then 3 tabs daily x 2 days, then 2 tabs daily x 2 days, then 1 tab daily x 2 days 20 tablet 0   rosuvastatin (CRESTOR) 40 MG tablet Take 1 tablet by mouth once daily 90 tablet 0    valsartan-hydrochlorothiazide (DIOVAN HCT) 160-12.5 MG tablet Take 1 tablet by mouth daily.     XIIDRA 5 % SOLN Apply 1 drop to eye 2 (two) times daily.     No facility-administered medications prior to visit.    Allergies  Allergen Reactions   Ace Inhibitors Cough   Celebrex [Celecoxib] Other (See Comments)    "makes me bleed"   Diovan [Valsartan]     angioedema    ROS See HPI    Objective:    Physical Exam Constitutional:      General: She is not in acute distress.    Appearance: Normal appearance. She is well-developed.  HENT:     Head: Normocephalic and atraumatic.     Right Ear: External ear normal.     Left Ear: External ear normal.  Eyes:     General: No scleral icterus. Neck:     Thyroid: No thyromegaly.  Cardiovascular:     Rate and Rhythm: Normal rate and regular rhythm.     Heart sounds: Normal heart sounds. No murmur heard. Pulmonary:     Effort: Pulmonary effort is normal. No respiratory distress.     Breath sounds: Normal breath sounds. No wheezing.  Abdominal:     General: Bowel sounds are normal.     Palpations: Abdomen is soft.     Tenderness: There is abdominal tenderness in the left upper quadrant.     Comments: Mild LUQ tenderness without guarding  Musculoskeletal:     Cervical back: Neck supple.  Skin:    General: Skin is warm and dry.  Neurological:     Mental Status: She is alert and oriented to person, place, and time.  Psychiatric:        Mood and Affect: Mood normal.        Behavior: Behavior normal.        Thought Content: Thought content normal.        Judgment: Judgment normal.      BP (!) 144/70 (BP Location: Left Arm, Patient Position: Sitting, Cuff Size: Normal)   Pulse (!) 52   Temp 97.9 F (36.6 C) (Oral)   Resp 16   Wt 238 lb (108 kg)   SpO2 100%   BMI 39.61 kg/m  Wt Readings from Last 3 Encounters:  04/16/23 238 lb (108 kg)  03/22/23 235 lb (106.6 kg)  02/28/23 239 lb 6.4 oz (108.6 kg)       Assessment &  Plan:   Problem List Items Addressed This Visit       Unprioritized   Diarrhea - Primary    New.  Recommended the following:   Ongoing for a month with associated abdominal pain and loss of appetite. No recent travel or antibiotic use. No hematochezia. -Collect stool samples for GI profile and C diff  -Check basic labs including electrolytes.  -Consider CT scan for possible diverticulitis if symptoms persist and  labs are normal. My suspicion for diverticulitis is low but she knows to let me know if pain worsens.       Relevant Orders   Comp Met (CMET)   CBC w/Diff   Lipase   Clostridium Difficile by PCR([facility])   GI Profile, Stool, PCR   Cramping of hands    Etiology unclear.  Will obtain electrolytes in setting of diarrhea.      Arthralgia    Pt advised to keep upcoming appointment with Rheumatology.  Tylenol prn pain.        I am having Jenniah G. Sherrin maintain her aspirin, nystatin, ondansetron, loratadine, fluticasone, alclomethasone, clobetasol, clotrimazole-betamethasone, albuterol, methocarbamol, hyoscyamine, imipramine, Vitamin D3, Repatha SureClick, neomycin-polymyxin-hydrocortisone, metoprolol succinate, Xiidra, cyclobenzaprine, ondansetron, valsartan-hydrochlorothiazide, furosemide, nitroGLYCERIN, Klor-Con M20, isosorbide mononitrate, amLODipine, predniSONE, methimazole, metoCLOPramide, and rosuvastatin.  No orders of the defined types were placed in this encounter.

## 2023-04-16 NOTE — Assessment & Plan Note (Addendum)
New.  Recommended the following:   Ongoing for a month with associated abdominal pain and loss of appetite. No recent travel or antibiotic use. No hematochezia. -Collect stool samples for GI profile and C diff  -Check basic labs including electrolytes.  -Consider CT scan for possible diverticulitis if symptoms persist and labs are normal. My suspicion for diverticulitis is low but she knows to let me know if pain worsens.

## 2023-04-16 NOTE — Assessment & Plan Note (Signed)
>>  ASSESSMENT AND PLAN FOR ARTHRALGIA WRITTEN ON 04/16/2023  4:47 PM BY O'SULLIVAN, Azaiah Mello, NP  Pt advised to keep upcoming appointment with Rheumatology.  Tylenol  prn pain.

## 2023-04-16 NOTE — Assessment & Plan Note (Signed)
Pt advised to keep upcoming appointment with Rheumatology.  Tylenol prn pain.

## 2023-04-16 NOTE — Assessment & Plan Note (Signed)
Etiology unclear.  Will obtain electrolytes in setting of diarrhea.

## 2023-04-17 LAB — COMPREHENSIVE METABOLIC PANEL
ALT: 20 U/L (ref 0–35)
AST: 16 U/L (ref 0–37)
Albumin: 4 g/dL (ref 3.5–5.2)
Alkaline Phosphatase: 99 U/L (ref 39–117)
BUN: 12 mg/dL (ref 6–23)
CO2: 26 meq/L (ref 19–32)
Calcium: 9.1 mg/dL (ref 8.4–10.5)
Chloride: 107 meq/L (ref 96–112)
Creatinine, Ser: 0.81 mg/dL (ref 0.40–1.20)
GFR: 76.62 mL/min (ref >60.00)
Glucose, Bld: 127 mg/dL — ABNORMAL HIGH (ref 70–99)
Potassium: 3.8 meq/L (ref 3.5–5.1)
Sodium: 142 meq/L (ref 135–145)
Total Bilirubin: 0.2 mg/dL (ref 0.2–1.2)
Total Protein: 6.8 g/dL (ref 6.0–8.3)

## 2023-04-17 LAB — CBC WITH DIFFERENTIAL/PLATELET
Basophils Absolute: 0.1 10*3/uL (ref 0.0–0.1)
Basophils Relative: 0.7 % (ref 0.0–3.0)
Eosinophils Absolute: 0.1 10*3/uL (ref 0.0–0.7)
Eosinophils Relative: 1.3 % (ref 0.0–5.0)
HCT: 37.2 % (ref 36.0–46.0)
Hemoglobin: 11.1 g/dL — ABNORMAL LOW (ref 12.0–15.0)
Lymphocytes Relative: 39.8 % (ref 12.0–46.0)
Lymphs Abs: 2.7 10*3/uL (ref 0.7–4.0)
MCHC: 29.9 g/dL — ABNORMAL LOW (ref 30.0–36.0)
MCV: 73.6 fL — ABNORMAL LOW (ref 78.0–100.0)
Monocytes Absolute: 0.5 10*3/uL (ref 0.1–1.0)
Monocytes Relative: 6.8 % (ref 3.0–12.0)
Neutro Abs: 3.5 10*3/uL (ref 1.4–7.7)
Neutrophils Relative %: 51.4 % (ref 43.0–77.0)
Platelets: 233 10*3/uL (ref 150.0–400.0)
RBC: 5.06 Mil/uL (ref 3.87–5.11)
RDW: 14.9 % (ref 11.5–15.5)
WBC: 6.8 10*3/uL (ref 4.0–10.5)

## 2023-04-17 LAB — LIPASE: Lipase: 27 U/L (ref 11.0–59.0)

## 2023-04-18 ENCOUNTER — Encounter: Payer: Self-pay | Admitting: Family

## 2023-04-19 ENCOUNTER — Ambulatory Visit: Payer: 59 | Admitting: Family

## 2023-04-19 NOTE — Progress Notes (Signed)
Mailed out to patient 

## 2023-04-23 ENCOUNTER — Other Ambulatory Visit: Payer: 59

## 2023-04-23 DIAGNOSIS — R197 Diarrhea, unspecified: Secondary | ICD-10-CM

## 2023-04-25 LAB — CLOSTRIDIUM DIFFICILE BY PCR: Toxigenic C. Difficile by PCR: NEGATIVE

## 2023-04-26 ENCOUNTER — Ambulatory Visit: Payer: 59 | Admitting: Family

## 2023-04-26 ENCOUNTER — Encounter: Payer: Self-pay | Admitting: Internal Medicine

## 2023-04-26 ENCOUNTER — Encounter: Payer: Self-pay | Admitting: Family

## 2023-04-26 ENCOUNTER — Telehealth: Payer: Self-pay | Admitting: Family

## 2023-04-26 ENCOUNTER — Ambulatory Visit: Payer: 59 | Attending: Internal Medicine | Admitting: Internal Medicine

## 2023-04-26 VITALS — BP 133/72 | HR 63 | Resp 14 | Ht 64.0 in | Wt 234.0 lb

## 2023-04-26 DIAGNOSIS — M2550 Pain in unspecified joint: Secondary | ICD-10-CM | POA: Diagnosis not present

## 2023-04-26 DIAGNOSIS — R197 Diarrhea, unspecified: Secondary | ICD-10-CM

## 2023-04-26 LAB — GI PROFILE, STOOL, PCR

## 2023-04-26 MED ORDER — DULOXETINE HCL 30 MG PO CPEP: 30.0000 mg | ORAL_CAPSULE | Freq: Every day | ORAL | 2 refills | Status: DC

## 2023-04-26 NOTE — Telephone Encounter (Signed)
Patient reports she is still having symptoms and she will like to go ahead with referral.

## 2023-04-26 NOTE — Telephone Encounter (Signed)
Please advise pt that her stool testing is negative. Is she still having nausea/vomiting? If so, I will place a referral to GI.

## 2023-04-26 NOTE — Progress Notes (Signed)
Office Visit Note  Patient: [name]             Date of Birth: June 02, 1961           MRN: [id number]             PCP: Sandford Craze, NP Referring: Sandford Craze, NP Visit Date: 04/26/2023   Subjective:  Follow-up   History of Present Illness: [name] is a 62 y.o. female here for follow up with continued joint pain in multiple areas.  Lab testing at initial visit showed positive ANA at 1: 80 titer decreased from 1: 320 previously.  Sedimentation rate was still just slightly above normal.  Other antibodies all negative.  Worst area is in her back.  Gets radiation into the legs with prolonged standing this is worse on the right side.  Walking is better and sitting is better compared to standing still.  She has had similar symptoms in the past says this was associated with lumbar disc disease.  Does not see any spine specialist for years.  She has previously gotten good relief with her chiropractor but because pain has been more manageable was not following up with them regularly.   Previous HPI 03/22/23 [name] is a 62 y.o. female here for evaluation of joint pain with positive ANA and ESR.  Medical history is also significant for thyrotoxicosis on treatment with methimazole, esophageal dysphagia and stricture, gastroparesis, and type 2 diabetes.  Some degree of chronic joint pain most prominent in her low back and hips that is not new but symptoms increased in the past year.  She develops area of numbness on the lateral portion of the thigh that occurs after prolonged standing.  Only occasional pain down the back of the leg but she does state very sore in the low back and this gets worse after prolonged walking or standing.  She has morning stiffness lasting about an hour.  Not associated with any significant new illness or injury.  She does have a previous history of left knee injury that was repaired arthroscopically many years ago and gets some pain with  increased use in that area.  Does not take any daily medications for joint pain.  She previously saw orthopedic specialist with back steroid injections that helped for a short duration.  She previously saw physical therapy years ago for back pain but did not feel like the treatment helped. Besides joint problems she does describe chronic eye dryness.  Was not found to have any Graves ophthalmopathy with previous evaluations.  Denies oral or nasal ulcers, Raynaud's syndrome, cervical or axillary lymphadenopathy, or abnormal bruising or bleeding.  She has patchy hyperpigmented spots on the distal arms and legs both sides she described as her birthmark is not new or any recent change. Chronic dysphagia previously seeing Dr. Adela Lank imaging and esophageal manometry consistent with hypercontractile esophagus. She is currently off the methimazole due to running out of medication just recently.  From chart review looks like she was recommended to have repeat thyroid parameters checked in July but these labs were never collected sounds like she was not aware or does not member at this time, has been taking 5 mg once daily refill was requested 9/16.   Labs reviewed ANA 1:80 speckled RF neg ESR 58   Review of Systems  Constitutional:  Negative for fatigue.  HENT:  Negative for mouth sores and mouth dryness.   Eyes:  Positive for dryness.  Respiratory:  Negative  for shortness of breath.   Cardiovascular:  Negative for chest pain and palpitations.  Gastrointestinal:  Negative for blood in stool, constipation and diarrhea.  Endocrine: Negative for increased urination.  Genitourinary:  Negative for involuntary urination.  Musculoskeletal:  Positive for joint pain, joint pain, joint swelling, myalgias, muscle weakness, morning stiffness, muscle tenderness and myalgias. Negative for gait problem.  Skin:  Negative for color change, rash, hair loss and sensitivity to sunlight.  Allergic/Immunologic: Negative  for susceptible to infections.  Neurological:  Negative for dizziness and headaches.  Hematological:  Negative for swollen glands.  Psychiatric/Behavioral:  Negative for depressed mood and sleep disturbance. The patient is not nervous/anxious.     PMFS History:  Patient Active Problem List   Diagnosis Date Noted   Cramping of hands 04/16/2023   Positive ANA (antinuclear antibody) 03/22/2023   Sedimentation rate elevation 03/22/2023   Skin cyst 11/02/2022   Arthralgia 11/02/2022   Bulging of lumbar intervertebral disc 09/24/2022   Lumbar radiculopathy 09/24/2022   Left arm pain 08/14/2022   Otitis externa of right ear 06/13/2022   Arthritis 05/11/2022   Edema 01/12/2022   Controlled type 2 diabetes mellitus without complication, without long-term current use of insulin (HCC) 12/01/2021   Facial abscess 09/15/2021   Non-functioning tympanostomy tube 08/25/2021   Cough 08/25/2021   Bilateral impacted cerumen 06/30/2021   Coronary artery disease of native heart with stable angina pectoris (HCC) 06/30/2021   Intertrigo 05/26/2021   DOE (dyspnea on exertion) 12/30/2020   Esophageal dysphagia    Hypercontractile esophagus    Colitis 02/04/2018   Aortic atherosclerosis (HCC) 07/17/2017   Chest pain 07/16/2017   Insomnia 01/12/2017   Breast cyst 06/09/2012   Hyperthyroidism 02/29/2012   Constipation 04/18/2011   Can't get food down 04/18/2011   Ulnar neuropathy 03/30/2011   Low back pain 10/18/2010   Diarrhea 11/01/2009   Fibromyalgia 07/20/2009   Hyperlipidemia 05/26/2009   Vitamin D deficiency 05/23/2009   Polyarthralgia 04/12/2009   Iron deficiency anemia 10/25/2008   GERD 10/25/2008   LIVER HEMANGIOMA 01/01/2008   ESOPHAGEAL STRICTURE 01/01/2008   Gastroparesis 01/01/2008   Anemia 12/08/2007   Allergic rhinitis 12/08/2007   Migraine headache 08/11/2007   HIATAL HERNIA 08/11/2007   OVARIAN CYST 08/11/2007   Essential hypertension 05/06/2007    Past Medical History:   Diagnosis Date   Allergy    allergic rhinitis   Arthritis    Atypical chest pain    Constipation    Cyst, ovarian    Diabetes mellitus, type II (HCC)    Elevated alkaline phosphatase level    Endometriosis    Esophageal stricture    Fatty liver    Gastroparesis    GERD (gastroesophageal reflux disease)    Heart murmur    Hepatic hemangioma    Hyperlipidemia    Hypertension    Hypertrophic condition of skin    acrokeratoelastoidosis- s/p 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 1/08- benign   Iron deficiency anemia    Migraine headache    Non-compliance    Peptic ulcer disease    Thyroid disease     Family History  Problem Relation Age of Onset   Hypertension Mother    Rheum arthritis Mother    Hypertension Father    Diabetes Father    Prostate cancer Father    Kidney disease Father    Diabetes Sister    Fibromyalgia Sister    Diabetes Maternal Grandmother    Lung cancer Maternal Grandfather    Arthritis Brother  Migraines Brother    CAD Brother    Fibromyalgia Sister    Migraines Sister    Colon cancer Neg Hx    Thyroid disease Neg Hx    Colon polyps Neg Hx    Past Surgical History:  Procedure Laterality Date   ABDOMINAL EXPLORATION SURGERY     w/bso    BREAST BIOPSY     CARDIAC CATHETERIZATION  2009   mild non obstructive CAD   CHOLECYSTECTOMY     COLONOSCOPY     ESOPHAGEAL MANOMETRY N/A 08/01/2020   Procedure: ESOPHAGEAL MANOMETRY (EM);  Surgeon: Benancio Deeds, MD;  Location: [facility];  Service: Gastroenterology;  Laterality: N/A;   KNEE SURGERY  2005    left knee   LEFT HEART CATH AND CORONARY ANGIOGRAPHY N/A 07/17/2017   Procedure: LEFT HEART CATH AND CORONARY ANGIOGRAPHY;  Surgeon: [place]az[place], Peter M, MD;  [facility] INVASIVE CV LAB;  Service: Cardiovascular;  Laterality: N/A;   LEFT HEART CATH AND CORONARY ANGIOGRAPHY N/A 09/01/2021   Procedure: LEFT HEART CATH AND CORONARY ANGIOGRAPHY;  Surgeon: Kathleene Hazel, MD;  Location: [facility] INVASIVE CV LAB;   Service: Cardiovascular;  Laterality: N/A;   POLYPECTOMY     TOTAL ABDOMINAL HYSTERECTOMY     UPPER GASTROINTESTINAL ENDOSCOPY     Social History   Social History Narrative   Widowed   Has 2 grown children.  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    Former Smoker Quit tobacco in 1996.  She was a pack a day smoker for approximately 10 years.    Alcohol use-yes: Social    Daily Caffeine Use:6 pack of pepsi daily     Illicit Drug Use - no    Patient does not get regular exercise.       Smoking Status:  quit   Immunization History  Administered Date(s) Administered   Fluad Quad(high Dose 65+) 03/31/2019   Influenza Split 04/30/2011   Influenza Whole 04/19/2006, 04/17/2007, 03/08/2009, 03/01/2010   Influenza, Quadrivalent, Recombinant, Inj, Pf 03/31/2019   Influenza, Seasonal, Injecte, Preservative Fre 05/30/2012   Influenza,inj,Quad PF,6+ Mos 02/15/2014, 03/08/2015, 04/30/2016, 04/24/2017, 04/04/2018, 06/10/2020   Influenza-Unspecified 02/23/2021   PFIZER(Purple Top)SARS-COV-2 Vaccination 09/07/2019, 09/29/2019, 05/20/2020   Pfizer Covid-19 Vaccine Bivalent Booster 43yrs & up 02/23/2021   Pneumococcal Conjugate-13 04/04/2018   Pneumococcal Polysaccharide-23 03/08/2009, 09/23/2020   Td 05/17/1999   Tdap 04/30/2011   Zoster Recombinant(Shingrix) 03/31/2019, 08/02/2019     Objective: Vital Signs: BP 133/72 (BP Location: Left Arm, Patient Position: Sitting, Cuff Size: Normal)   Pulse 63   Resp 14   Ht 5\' 4"  (1.626 m)   Wt 234 lb (106.1 kg)   BMI 40.17 kg/m    Physical Exam Constitutional:      Appearance: She is obese.  Eyes:     Conjunctiva/sclera: Conjunctivae normal.  Cardiovascular:     Rate and Rhythm: Normal rate and regular rhythm.  Pulmonary:     Effort: Pulmonary effort is normal.     Breath sounds: Normal breath sounds.  Lymphadenopathy:     Cervical: No cervical adenopathy.  Skin:    General: Skin is warm and dry.      Findings: Rash present.     Comments: Firm slightly raised 2-3 cm flesh-colored or slightly hypopigmented nodules throughout especially distal extremities  Neurological:     Mental Status: She is alert.  Psychiatric:        Mood and Affect: Mood normal.  Musculoskeletal Exam:  Shoulders full ROM no tenderness or swelling Elbows full ROM no tenderness or swelling Wrists full ROM no tenderness or swelling Fingers full ROM no tenderness or swelling Low back midline and paraspinal tenderness, does not extend over mid back, also tender over gluteal region greater trochanter worse through right side  Investigation: No additional findings.  Imaging: No results found.  Recent Labs: Lab Results  Component Value Date   WBC 6.8 04/16/2023   HGB 11.1 (L) 04/16/2023   PLT 233.0 04/16/2023   NA 142 04/16/2023   K 3.8 04/16/2023   CL 107 04/16/2023   CO2 26 04/16/2023   GLUCOSE 127 (H) 04/16/2023   BUN 12 04/16/2023   CREATININE 0.81 04/16/2023   BILITOT 0.2 04/16/2023   ALKPHOS 99 04/16/2023   AST 16 04/16/2023   ALT 20 04/16/2023   PROT 6.8 04/16/2023   ALBUMIN 4.0 04/16/2023   CALCIUM 9.1 04/16/2023   GFRAA >60 04/06/2019    Speciality Comments: No specialty comments available.  Procedures:  No procedures performed Allergies: Ace inhibitors, Celebrex [celecoxib], and Diovan [valsartan]   Assessment / Plan:     Visit Diagnoses: Polyarthralgia - Plan: DULoxetine (CYMBALTA) 30 MG capsule  Again did not see any definite inflammatory joint or skin changes on exam.  With positive ANA but decreasing titer and otherwise negative antibodies I do not think these criteria indicate a systemic rheumatic disease.  I discussed positive ANA can be seen in association with autoimmune thyroid disease and a significant number of cases.  Worst pains in her back and hips and recent x-rays from September showed mild degenerative arthritis especially L5-S1.  But also has pain and  stiffness in other areas such as throughout the shoulders and upper back muscles.  Recommend trial of adding duloxetine 30 mg once daily for both osteoarthritis and myofascial pain.  She plans to follow-up with chiropractor if needed could refer to see orthopedic specialist for low back pain.  Orders: No orders of the defined types were placed in this encounter.  Meds ordered this encounter  Medications   DULoxetine (CYMBALTA) 30 MG capsule    Sig: Take 1 capsule (30 mg total) by mouth daily.    Dispense:  30 capsule    Refill:  2     Follow-Up Instructions: Return in about 3 months (around 07/27/2023) for OA/?ANA duloxetine trial f/u 3mos.   Fuller Plan, MD  Note - This record has been created using [employer].  Chart creation errors have been sought, but may not always  have been located. Such creation errors do not reflect on  the standard of medical care.

## 2023-04-29 ENCOUNTER — Encounter: Payer: Self-pay | Admitting: Family

## 2023-04-29 ENCOUNTER — Ambulatory Visit: Payer: 59 | Admitting: Family

## 2023-05-31 ENCOUNTER — Telehealth: Payer: Self-pay | Admitting: Family

## 2023-05-31 NOTE — Telephone Encounter (Signed)
Copied from CRM [id number]. Topic: Medicare AWV >> May 31, 2023 11:36 AM Payton Doughty wrote: Reason for CRM: Called LVM 05/31/2023 to schedule AWV TELEHEALTH ONLY  Verlee Rossetti; Care Guide Ambulatory Clinical Support [facility] Direct Dial: [phone]

## 2023-06-05 ENCOUNTER — Ambulatory Visit: Payer: 59

## 2023-06-05 NOTE — Progress Notes (Unsigned)
Pt scheduled too early for AWV.This encounter was created in error - please disregard.

## 2023-06-10 ENCOUNTER — Ambulatory Visit (INDEPENDENT_AMBULATORY_CARE_PROVIDER_SITE_OTHER): Payer: 59 | Admitting: *Deleted

## 2023-06-10 DIAGNOSIS — Z1231 Encounter for screening mammogram for malignant neoplasm of breast: Secondary | ICD-10-CM

## 2023-06-10 DIAGNOSIS — Z0000 Encounter for general adult medical examination without abnormal findings: Secondary | ICD-10-CM | POA: Diagnosis not present

## 2023-06-10 NOTE — Addendum Note (Signed)
Addended by: Donne Anon L on: 06/10/2023 10:24 AM   Modules accepted: Level of Service

## 2023-06-10 NOTE — Progress Notes (Addendum)
Subjective:   [name] is a 62 y.o. female who presents for Medicare Annual (Subsequent) preventive examination.  Visit Complete: Virtual I connected with  [name] on 06/10/23 by a audio enabled telemedicine application and verified that I am speaking with the correct person using two identifiers.  Patient Location: Home  Provider Location: Office/Clinic  I discussed the limitations of evaluation and management by telemedicine. The patient expressed understanding and agreed to proceed.  Vital Signs: Because this visit was a virtual/telehealth visit, some criteria may be missing or patient reported. Any vitals not documented were not able to be obtained and vitals that have been documented are patient reported.   Cardiac Risk Factors include: advanced age (>27men, >64 women);diabetes mellitus;obesity (BMI >30kg/m2);dyslipidemia;hypertension     Objective:    There were no vitals filed for this visit. There is no height or weight on file to calculate BMI.     06/10/2023    8:24 AM 10/30/2022    8:48 PM 08/14/2022    6:19 PM 06/08/2022    9:02 AM 09/01/2021    7:44 AM 06/02/2021    9:06 AM 07/15/2020    3:17 PM  Advanced Directives  Does Patient Have a Medical Advance Directive? No No No No No No No  Would patient like information on creating a medical advance directive? No - Patient declined No - Patient declined  No - Patient declined No - Patient declined Yes (MAU/Ambulatory/Procedural Areas - Information given) No - Patient declined    Current Medications (verified) Outpatient Encounter Medications as of 06/10/2023  Medication Sig   albuterol (VENTOLIN HFA) 108 (90 Base) MCG/ACT inhaler INHALE 2 PUFFS BY MOUTH 4 TIMES DAILY   alclomethasone (ACLOVATE) 0.05 % cream Apply topically 2 (two) times daily as needed (Rash). (Patient not taking: Reported on 04/26/2023)   amLODipine (NORVASC) 10 MG tablet Take 1 tablet by mouth once daily   aspirin 81 MG chewable  tablet Chew 1 tablet (81 mg total) by mouth daily.   Cholecalciferol (VITAMIN D3) 75 MCG (3000 UT) TABS Take 1 tablet by mouth daily.   clobetasol (TEMOVATE) 0.05 % external solution Apply 1 application topically 2 (two) times daily.   clotrimazole-betamethasone (LOTRISONE) cream Apply 1 application topically 2 (two) times daily.   cyclobenzaprine (FLEXERIL) 5 MG tablet 5 mg 3 (three) times daily as needed. (Patient not taking: Reported on 04/26/2023)   DULoxetine (CYMBALTA) 30 MG capsule Take 1 capsule (30 mg total) by mouth daily.   Evolocumab (REPATHA SURECLICK) 140 MG/ML SOAJ Inject 1 pen (140 mg) subcutaneously every 14 days   fluticasone (FLONASE) 50 MCG/ACT nasal spray Place 2 sprays into both nostrils daily.   furosemide (LASIX) 20 MG tablet Take 1 tablet (20 mg total) by mouth daily as needed (for shortness of breath or weight gain of 2 lbs overnight or 5 lbs in a week).   hyoscyamine (LEVSIN) 0.125 MG tablet Take 1 tablet (0.125 mg total) by mouth every 4 (four) hours as needed for cramping (diarrhea,nausea).   isosorbide mononitrate (IMDUR) 60 MG 24 hr tablet TAKE 1 & 1/2 (ONE & ONE-HALF) TABLETS BY MOUTH ONCE DAILY   loratadine (CLARITIN) 10 MG tablet Take 1 tablet (10 mg total) by mouth daily.   methimazole (TAPAZOLE) 5 MG tablet Take as directed by MD   methocarbamol (ROBAXIN) 500 MG tablet Take 1 tablet (500 mg total) by mouth every 8 (eight) hours as needed for muscle spasms.   metoCLOPramide (REGLAN) 5 MG tablet TAKE  1 TABLET BY MOUTH THREE TIMES DAILY BEFORE MEAL(S) (Patient not taking: Reported on 04/26/2023)   metoprolol succinate (TOPROL-XL) 100 MG 24 hr tablet Take 1 tablet (100 mg total) by mouth daily. Take with or immediately following a meal. (Patient not taking: Reported on 04/26/2023)   neomycin-polymyxin-hydrocortisone (CORTISPORIN) OTIC solution Place 3 drops into the right ear 4 (four) times daily. (Patient not taking: Reported on 04/26/2023)   nitroGLYCERIN (NITROSTAT)  0.4 MG SL tablet DISSOLVE ONE TABLET UNDER THE TONGUE EVERY 5 MINUTES AS NEEDED FOR CHEST PAIN.  DO NOT EXCEED A TOTAL OF 3 DOSES IN 15 MINUTES (Patient not taking: Reported on 04/26/2023)   nystatin (MYCOSTATIN/NYSTOP) powder Apply 1 application topically 2 (two) times daily. Beneath both breasts (Patient not taking: Reported on 04/26/2023)   ondansetron (ZOFRAN) 4 MG tablet Take 1 tab by mouth every 6 hours as needed for nausea.   ondansetron (ZOFRAN-ODT) 8 MG disintegrating tablet Take 8 mg by mouth every 8 (eight) hours as needed for vomiting or nausea.   potassium chloride SA (KLOR-CON M20) 20 MEQ tablet Take 1 tablet by mouth once daily   rosuvastatin (CRESTOR) 40 MG tablet Take 1 tablet by mouth once daily   valsartan-hydrochlorothiazide (DIOVAN HCT) 160-12.5 MG tablet Take 1 tablet by mouth daily. (Patient not taking: Reported on 04/26/2023)   XIIDRA 5 % SOLN Apply 1 drop to eye 2 (two) times daily.   No facility-administered encounter medications on file as of 06/10/2023.    Allergies (verified) Ace inhibitors, Celebrex [celecoxib], and Diovan [valsartan]   History: 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/08- benign   Iron deficiency anemia    Migraine headache    Non-compliance    Peptic ulcer disease    Thyroid disease    Past Surgical History:  Procedure Laterality Date   ABDOMINAL EXPLORATION SURGERY     w/bso    BREAST BIOPSY     CARDIAC CATHETERIZATION  2009   mild non obstructive CAD   CHOLECYSTECTOMY     COLONOSCOPY      ESOPHAGEAL MANOMETRY N/A 08/01/2020   Procedure: ESOPHAGEAL MANOMETRY (EM);  Surgeon: Benancio Deeds, MD;  Location: [facility];  Service: Gastroenterology;  Laterality: N/A;   KNEE SURGERY  2005    left knee   LEFT HEART CATH AND CORONARY ANGIOGRAPHY N/A 07/17/2017   Procedure: LEFT HEART CATH AND CORONARY ANGIOGRAPHY;  Surgeon: Swazil[place], Peter M, MD;  [facility]: [facility] INVASIVE CV LAB;  Service: Cardiovascular;  Laterality: N/A;   LEFT HEART CATH AND CORONARY ANGIOGRAPHY N/A 09/01/2021   Procedure: LEFT HEART CATH AND CORONARY ANGIOGRAPHY;  Surgeon: Kathleene Hazel, MD;  Location: [facility] INVASIVE CV LAB;  Service: Cardiovascular;  Laterality: N/A;   POLYPECTOMY     TOTAL ABDOMINAL HYSTERECTOMY     UPPER GASTROINTESTINAL ENDOSCOPY     Family History  Problem Relation Age of Onset   Hypertension Mother    Rheum arthritis Mother  Hypertension Father    Diabetes Father    Prostate cancer Father    Kidney disease Father    Diabetes Sister    Fibromyalgia Sister    Diabetes Maternal Grandmother    Lung cancer Maternal Grandfather    Arthritis Brother    Migraines Brother    CAD Brother    Fibromyalgia Sister    Migraines Sister    Colon cancer Neg Hx    Thyroid disease Neg Hx    Colon polyps Neg Hx    Social History   Socioeconomic History   Marital status: Widowed    Spouse name: Not on file   Number of children: 2   Years of education: Not on file   Highest education level: Not on file  Occupational History   Occupation: DISABILITY    Employer: UNEMPLOYED  Tobacco Use   Smoking status: Former    Current packs/day: 0.00    Average packs/day: 0.5 packs/day for 18.0 years (9.0 ttl pk-yrs)    Types: Cigarettes    Start date: 07/29/1977    Quit date: 06/25/1994    Years since quitting: 28.9   Smokeless tobacco: Never   Tobacco comments:    quit 19 years ago  Vaping Use   Vaping status: Never Used  Substance and Sexual Activity   Alcohol use: Yes    Alcohol/week:  0.0 standard drinks of alcohol    Comment: social drinker   Drug use: No   Sexual activity: Not Currently  Other Topics Concern   Not on file  Social History Narrative   Widowed   Has 2 grown children.  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    Former Smoker Quit tobacco in 1996.  She was a pack a day smoker for approximately 10 years.    Alcohol use-yes: Social    Daily Caffeine Use:6 pack of pepsi daily     Illicit Drug Use - no    Patient does not get regular exercise.       Smoking Status:  quit   Social Drivers of Health   Financial Resource Strain: Low Risk  (01/16/2023)   Overall Financial Resource Strain (CARDIA)    Difficulty of Paying Living Expenses: Not hard at all  Food Insecurity: No Food Insecurity (01/16/2023)   Hunger Vital Sign    Worried About Running Out of Food in the Last Year: Never true    Ran Out of Food in the Last Year: Never true  Transportation Needs: No Transportation Needs (01/16/2023)   PRAPARE - Administrator, Civil Service (Medical): No    Lack of Transportation (Non-Medical): No  Physical Activity: Inactive (06/10/2023)   Exercise Vital Sign    Days of Exercise per Week: 0 days    Minutes of Exercise per Session: 0 min  Stress: No Stress Concern Present (01/16/2023)   [employer] of Occupational Health - Occupational Stress Questionnaire    Feeling of Stress : Not at all  Social Connections: Moderately Isolated (06/10/2023)   Social Connection and Isolation Panel [NHANES]    Frequency of Communication with Friends and Family: More than three times a week    Frequency of Social Gatherings with Friends and Family: More than three times a week    Attends Religious Services: More than 4 times per year    Active Member of [employer] or Organizations: No    Attends Banker Meetings: Never    Marital  Status: Widowed    Tobacco Counseling Counseling given: Not Answered Tobacco  comments: quit 19 years ago   Clinical Intake:  Pre-visit preparation completed: Yes  Pain : No/denies pain  Nutritional Risks: None Diabetes: Yes CBG done?: No Did pt. bring in CBG monitor from home?: No  How often do you need to have someone help you when you read instructions, pamphlets, or other written materials from your doctor or pharmacy?: 1 - Never  Interpreter Needed?: No  Information entered by :: Donne Anon, CMA   Activities of Daily Living    06/10/2023    8:22 AM  In your present state of health, do you have any difficulty performing the following activities:  Hearing? 0  Vision? 0  Difficulty concentrating or making decisions? 0  Walking or climbing stairs? 0  Dressing or bathing? 0  Doing errands, shopping? 0  Preparing Food and eating ? N  Using the Toilet? N  In the past six months, have you accidently leaked urine? N  Do you have problems with loss of bowel control? N  Managing your Medications? N  Managing your Finances? N  Housekeeping or managing your Housekeeping? N    Patient Care Team: Sandford Craze, NP as PCP - General (Internal Medicine) Pricilla Riffle, MD as PCP - Cardiology (Cardiology) Josph Macho, MD as Consulting Physician (Internal Medicine) Izell Redland, MD as Consulting Physician (Endocrinology) Josph Macho, MD as Consulting Physician (Oncology) Glyn Ade, PA-C as Physician Assistant (Dermatology)  Indicate any recent Medical Services you may have received from other than [facility] providers in the past year (date may be approximate).     Assessment:   This is a routine wellness examination for [place].  Hearing/Vision screen No results found.   Goals Addressed   None    Depression Screen    06/10/2023    8:26 AM 06/10/2023    8:25 AM 09/25/2022   12:21 PM 06/08/2022    9:04 AM 06/02/2021    9:10 AM 05/26/2021    7:53 AM 02/21/2021    7:29 AM  PHQ 2/9 Scores  PHQ - 2 Score 0 0 0 0 0 0 0     Fall Risk    06/10/2023    8:24 AM 09/25/2022   12:21 PM 06/08/2022    9:03 AM 06/02/2021    9:08 AM 05/26/2021    7:53 AM  Fall Risk   Falls in the past year? 0 0 0 0 0  Number falls in past yr: 0 0 0 0 0  Injury with Fall? 0 0 0 0 0  Risk for fall due to : No Fall Risks No Fall Risks No Fall Risks  No Fall Risks  Follow up Falls evaluation completed Falls evaluation completed Falls evaluation completed Falls prevention discussed Falls evaluation completed    MEDICARE RISK AT HOME: Medicare Risk at Home Any stairs in or around the home?: Yes If so, are there any without handrails?: No Home free of loose throw rugs in walkways, pet beds, electrical cords, etc?: Yes Adequate lighting in your home to reduce risk of falls?: Yes Life alert?: No Use of a cane, walker or w/c?: No Grab bars in the bathroom?: No Shower chair or bench in shower?: No Elevated toilet seat or a handicapped toilet?: Yes  TIMED UP AND GO:  Was the test performed?  No    Cognitive Function:    02/11/2015    9:49 AM  MMSE - Mini Mental  State Exam  Not completed: --        06/10/2023    8:29 AM 06/08/2022    9:07 AM  6CIT Screen  What Year? 0 points 0 points  What month? 0 points 0 points  What time? 0 points 0 points  Count back from 20 0 points 0 points  Months in reverse 0 points 4 points  Repeat phrase 0 points 6 points  Total Score 0 points 10 points    Immunizations Immunization History  Administered Date(s) Administered   Fluad Quad(high Dose 65+) 03/31/2019   Influenza Split 04/30/2011   Influenza Whole 04/19/2006, 04/17/2007, 03/08/2009, 03/01/2010   Influenza, Quadrivalent, Recombinant, Inj, Pf 03/31/2019   Influenza, Seasonal, Injecte, Preservative Fre 05/30/2012   Influenza,inj,Quad PF,6+ Mos 02/15/2014, 03/08/2015, 04/30/2016, 04/24/2017, 04/04/2018, 06/10/2020   Influenza-Unspecified 02/23/2021   PFIZER(Purple Top)SARS-COV-2 Vaccination 09/07/2019, 09/29/2019, 05/20/2020    Pfizer Covid-19 Vaccine Bivalent Booster 35yrs & up 02/23/2021   Pneumococcal Conjugate-13 04/04/2018   Pneumococcal Polysaccharide-23 03/08/2009, 09/23/2020   Td 05/17/1999   Tdap 04/30/2011   Zoster Recombinant(Shingrix) 03/31/2019, 08/02/2019    TDAP status: Due, Education has been provided regarding the importance of this vaccine. Advised may receive this vaccine at local pharmacy or Health Dept. Aware to provide a copy of the vaccination record if obtained from local pharmacy or Health Dept. Verbalized acceptance and understanding.  Flu Vaccine status: Due, Education has been provided regarding the importance of this vaccine. Advised may receive this vaccine at local pharmacy or Health Dept. Aware to provide a copy of the vaccination record if obtained from local pharmacy or Health Dept. Verbalized acceptance and understanding.  Pneumococcal vaccine status: Up to date  Covid-19 vaccine status: Information provided on how to obtain vaccines.   Qualifies for Shingles Vaccine? Yes   Zostavax completed No   Shingrix Completed?: Yes  Screening Tests Health Maintenance  Topic Date Due   OPHTHALMOLOGY EXAM  Never done   FOOT EXAM  05/30/2013   DTaP/Tdap/Td (3 - Td or Tdap) 04/29/2021   HEMOGLOBIN A1C  08/06/2022   MAMMOGRAM  09/29/2022   Diabetic kidney evaluation - Urine ACR  12/02/2022   INFLUENZA VACCINE  01/24/2023   COVID-19 Vaccine (5 - 2024-25 season) 02/24/2023   Medicare Annual Wellness (AWV)  06/09/2023   Diabetic kidney evaluation - eGFR measurement  04/15/2024   Colonoscopy  07/17/2027   Hepatitis C Screening  Completed   HIV Screening  Completed   Zoster Vaccines- Shingrix  Completed   HPV VACCINES  Aged Out    Health Maintenance  Health Maintenance Due  Topic Date Due   OPHTHALMOLOGY EXAM  Never done   FOOT EXAM  05/30/2013   DTaP/Tdap/Td (3 - Td or Tdap) 04/29/2021   HEMOGLOBIN A1C  08/06/2022   MAMMOGRAM  09/29/2022   Diabetic kidney evaluation - Urine  ACR  12/02/2022   INFLUENZA VACCINE  01/24/2023   COVID-19 Vaccine (5 - 2024-25 season) 02/24/2023   Medicare Annual Wellness (AWV)  06/09/2023    Colorectal cancer screening: Type of screening: Colonoscopy. Completed 07/16/17. Repeat every 10 years  Mammogram status: Ordered 06/10/23. Pt provided with contact info and advised to call to schedule appt.   Bone Density status: due at age 37  Lung Cancer Screening: (Low Dose CT Chest recommended if Age 11-80 years, 20 pack-year currently smoking OR have quit w/in 15years.) does not qualify.   Additional Screening:  Hepatitis C Screening: does qualify; Completed 08/10/19  Vision Screening: Recommended annual ophthalmology exams for  early detection of glaucoma and other disorders of the eye. Is the patient up to date with their annual eye exam?  Yes  Who is the provider or what is the name of the office in which the patient attends annual eye exams? [facility] If pt is not established with a provider, would they like to be referred to a provider to establish care? No .   Dental Screening: Recommended annual dental exams for proper oral hygiene  Diabetic Foot Exam: Diabetic Foot Exam: Overdue, Pt has been advised about the importance in completing this exam. Pt is scheduled for diabetic foot exam on N/a.  Community Resource Referral / Chronic Care Management: CRR required this visit?  No   CCM required this visit?  No     Plan:     I have personally reviewed and noted the following in the patient's chart:   Medical and social history Use of alcohol, tobacco or illicit drugs  Current medications and supplements including opioid prescriptions. Patient is not currently taking opioid prescriptions. Functional ability and status Nutritional status Physical activity Advanced directives List of other physicians Hospitalizations, surgeries, and ER visits in previous 12 months Vitals Screenings to include cognitive, depression, and  falls Referrals and appointments  In addition, I have reviewed and discussed with patient certain preventive protocols, quality metrics, and best practice recommendations. A written personalized care plan for preventive services as well as general preventive health recommendations were provided to patient.     Donne Anon, CMA   06/10/2023   After Visit Summary: (Declined) Due to this being a telephonic visit, with patients personalized plan was offered to patient but patient Declined AVS at this time   Nurse Notes: None

## 2023-06-10 NOTE — Patient Instructions (Signed)
[name] , Thank you for taking time to come for your Medicare Wellness Visit. I appreciate your ongoing commitment to your health goals. Please review the following plan we discussed and let me know if I can assist you in the future.     This is a list of the screening recommended for you and due dates:  Health Maintenance  Topic Date Due   Eye exam for diabetics  Never done   Complete foot exam   05/30/2013   DTaP/Tdap/Td vaccine (3 - Td or Tdap) 04/29/2021   Hemoglobin A1C  08/06/2022   Mammogram  09/29/2022   Yearly kidney health urinalysis for diabetes  12/02/2022   Flu Shot  01/24/2023   COVID-19 Vaccine (5 - 2024-25 season) 02/24/2023   Yearly kidney function blood test for diabetes  04/15/2024   Medicare Annual Wellness Visit  06/09/2024   Colon Cancer Screening  07/17/2027   Hepatitis C Screening  Completed   HIV Screening  Completed   Zoster (Shingles) Vaccine  Completed   HPV Vaccine  Aged Out     Next appointment: Follow up in one year for your annual wellness visit.   Preventive Care 40-64 Years, Female Preventive care refers to lifestyle choices and visits with your health care provider that can promote health and wellness. What does preventive care include? A yearly physical exam. This is also called an annual well check. Dental exams once or twice a year. Routine eye exams. Ask your health care provider how often you should have your eyes checked. Personal lifestyle choices, including: Daily care of your teeth and gums. Regular physical activity. Eating a healthy diet. Avoiding tobacco and drug use. Limiting alcohol use. Practicing safe sex. Taking low-dose aspirin daily starting at age 22. Taking vitamin and mineral supplements as recommended by your health care provider. What happens during an annual well check? The services and screenings done by your health care provider during your annual well check will depend on your age, overall health, lifestyle  risk factors, and family history of disease. Counseling  Your health care provider may ask you questions about your: Alcohol use. Tobacco use. Drug use. Emotional well-being. Home and relationship well-being. Sexual activity. Eating habits. Work and work Astronomer. Method of birth control. Menstrual cycle. Pregnancy history. Screening  You may have the following tests or measurements: Height, weight, and BMI. Blood pressure. Lipid and cholesterol levels. These may be checked every 5 years, or more frequently if you are over 80 years old. Skin check. Lung cancer screening. You may have this screening every year starting at age 90 if you have a 30-pack-year history of smoking and currently smoke or have quit within the past 15 years. Fecal occult blood test (FOBT) of the stool. You may have this test every year starting at age 16. Flexible sigmoidoscopy or colonoscopy. You may have a sigmoidoscopy every 5 years or a colonoscopy every 10 years starting at age 58. Hepatitis C blood test. Hepatitis B blood test. Sexually transmitted disease (STD) testing. Diabetes screening. This is done by checking your blood sugar (glucose) after you have not eaten for a while (fasting). You may have this done every 1-3 years. Mammogram. This may be done every 1-2 years. Talk to your health care provider about when you should start having regular mammograms. This may depend on whether you have a family history of breast cancer. BRCA-related cancer screening. This may be done if you have a family history of breast, ovarian, tubal, or peritoneal  cancers. Pelvic exam and Pap test. This may be done every 3 years starting at age 63. Starting at age 51, this may be done every 5 years if you have a Pap test in combination with an HPV test. Bone density scan. This is done to screen for osteoporosis. You may have this scan if you are at high risk for osteoporosis. Discuss your test results, treatment options,  and if necessary, the need for more tests with your health care provider. Vaccines  Your health care provider may recommend certain vaccines, such as: Influenza vaccine. This is recommended every year. Tetanus, diphtheria, and acellular pertussis (Tdap, Td) vaccine. You may need a Td booster every 10 years. Zoster vaccine. You may need this after age 69. Pneumococcal 13-valent conjugate (PCV13) vaccine. You may need this if you have certain conditions and were not previously vaccinated. Pneumococcal polysaccharide (PPSV23) vaccine. You may need one or two doses if you smoke cigarettes or if you have certain conditions. Talk to your health care provider about which screenings and vaccines you need and how often you need them. This information is not intended to replace advice given to you by your health care provider. Make sure you discuss any questions you have with your health care provider. Document Released: 07/08/2015 Document Revised: 02/29/2016 Document Reviewed: 04/12/2015 Elsevier Interactive Patient Education  2017 [employer].    Fall Prevention in the Home Falls can cause injuries. They can happen to people of all ages. There are many things you can do to make your home safe and to help prevent falls. What can I do on the outside of my home? Regularly fix the edges of walkways and driveways and fix any cracks. Remove anything that might make you trip as you walk through a door, such as a raised step or threshold. Trim any bushes or trees on the path to your home. Use bright outdoor lighting. Clear any walking paths of anything that might make someone trip, such as rocks or tools. Regularly check to see if handrails are loose or broken. Make sure that both sides of any steps have handrails. Any raised decks and porches should have guardrails on the edges. Have any leaves, snow, or ice cleared regularly. Use sand or salt on walking paths during winter. Clean up any spills in  your garage right away. This includes oil or grease spills. What can I do in the bathroom? Use night lights. Install grab bars by the toilet and in the tub and shower. Do not use towel bars as grab bars. Use non-skid mats or decals in the tub or shower. If you need to sit down in the shower, use a plastic, non-slip stool. Keep the floor dry. Clean up any water that spills on the floor as soon as it happens. Remove soap buildup in the tub or shower regularly. Attach bath mats securely with double-sided non-slip rug tape. Do not have throw rugs and other things on the floor that can make you trip. What can I do in the bedroom? Use night lights. Make sure that you have a light by your bed that is easy to reach. Do not use any sheets or blankets that are too big for your bed. They should not hang down onto the floor. Have a firm chair that has side arms. You can use this for support while you get dressed. Do not have throw rugs and other things on the floor that can make you trip. What can I do in the  kitchen? Clean up any spills right away. Avoid walking on wet floors. Keep items that you use a lot in easy-to-reach places. If you need to reach something above you, use a strong step stool that has a grab bar. Keep electrical cords out of the way. Do not use floor polish or wax that makes floors slippery. If you must use wax, use non-skid floor wax. Do not have throw rugs and other things on the floor that can make you trip. What can I do with my stairs? Do not leave any items on the stairs. Make sure that there are handrails on both sides of the stairs and use them. Fix handrails that are broken or loose. Make sure that handrails are as long as the stairways. Check any carpeting to make sure that it is firmly attached to the stairs. Fix any carpet that is loose or worn. Avoid having throw rugs at the top or bottom of the stairs. If you do have throw rugs, attach them to the floor with carpet  tape. Make sure that you have a light switch at the top of the stairs and the bottom of the stairs. If you do not have them, ask someone to add them for you. What else can I do to help prevent falls? Wear shoes that: Do not have high heels. Have rubber bottoms. Are comfortable and fit you well. Are closed at the toe. Do not wear sandals. If you use a stepladder: Make sure that it is fully opened. Do not climb a closed stepladder. Make sure that both sides of the stepladder are locked into place. Ask someone to hold it for you, if possible. Clearly mark and make sure that you can see: Any grab bars or handrails. First and last steps. Where the edge of each step is. Use tools that help you move around (mobility aids) if they are needed. These include: Canes. Walkers. Scooters. Crutches. Turn on the lights when you go into a dark area. Replace any light bulbs as soon as they burn out. Set up your furniture so you have a clear path. Avoid moving your furniture around. If any of your floors are uneven, fix them. If there are any pets around you, be aware of where they are. Review your medicines with your doctor. Some medicines can make you feel dizzy. This can increase your chance of falling. Ask your doctor what other things that you can do to help prevent falls. This information is not intended to replace advice given to you by your health care provider. Make sure you discuss any questions you have with your health care provider. Document Released: 04/07/2009 Document Revised: 11/17/2015 Document Reviewed: 07/16/2014 Elsevier Interactive Patient Education  2017 [employer].

## 2023-06-21 ENCOUNTER — Other Ambulatory Visit: Payer: Self-pay | Admitting: Family

## 2023-06-24 ENCOUNTER — Encounter: Payer: Self-pay | Admitting: Family

## 2023-06-27 ENCOUNTER — Encounter: Payer: Self-pay | Admitting: Gastroenterology

## 2023-06-27 ENCOUNTER — Ambulatory Visit: Payer: 59 | Admitting: Gastroenterology

## 2023-06-27 VITALS — BP 132/78 | HR 84 | Ht 64.0 in | Wt 228.2 lb

## 2023-06-27 DIAGNOSIS — R1012 Left upper quadrant pain: Secondary | ICD-10-CM | POA: Diagnosis not present

## 2023-06-27 DIAGNOSIS — K219 Gastro-esophageal reflux disease without esophagitis: Secondary | ICD-10-CM | POA: Diagnosis not present

## 2023-06-27 MED ORDER — PANTOPRAZOLE SODIUM 40 MG PO TBEC: 40.0000 mg | DELAYED_RELEASE_TABLET | Freq: Every day | ORAL | 5 refills | Status: DC

## 2023-06-27 NOTE — Patient Instructions (Signed)
 We have sent the following medications to your pharmacy for you to pick up at your convenience: Pantoprazole  40 mg daily 30-60 minutes before breakfast.   Call in 4-6 weeks with an update, ask for Patty.   _______________________________________________________  If your blood pressure at your visit was 140/90 or greater, please contact your primary care physician to follow up on this.  _______________________________________________________  If you are age 85 or older, your body mass index should be between 23-30. Your Body mass index is 39.17 kg/m. If this is out of the aforementioned range listed, please consider follow up with your Primary Care Provider.  If you are age 14 or younger, your body mass index should be between 19-25. Your Body mass index is 39.17 kg/m. If this is out of the aformentioned range listed, please consider follow up with your Primary Care Provider.   ________________________________________________________  The [facility] providers would like to encourage you to use MYCHART to communicate with providers for non-urgent requests or questions.  Due to long hold times on the telephone, sending your provider a message by [facility] may be a faster and more efficient way to get a response.  Please allow 48 business hours for a response.  Please remember that this is for non-urgent requests.  _______________________________________________________

## 2023-06-27 NOTE — Progress Notes (Signed)
 Agree with assessment and plan as outlined.

## 2023-06-27 NOTE — Progress Notes (Signed)
 06/27/2023 [name] [id number] [birth year]   HISTORY OF PRESENT ILLNESS: This is a 44 female who is a patient Dr. Hassan.  Has past medical history as listed below.  She is here today with complaints of reflux and left upper quadrant abdominal pain.  She is not on any reflux medication regularly, but using Tums and Alka-Seltzer frequently.  She has Reglan  on her medication list and in her history has gastroparesis listed.  Gastric emptying scan in 2011 that was normal, but then had one through [facility] in June 2013 that showed mildly delayed gastric emptying at 4 hours.  CT scan abdomen and pelvis with contrast 02/2022:  IMPRESSION: 1. No acute findings in the abdomen or pelvis to explain the patient's history of left-sided pain. 2. Marked circumferential wall thickening noted distal esophagus. Esophagitis a distinct consideration. Neoplasm not excluded. Consider GI follow-up. 3. Involuting cavernous hemangioma again noted posterior right liver, decreased since prior CT but similar to more recent MRI. 4. Aortic Atherosclerosis (ICD10-I70.0).  EGD 03/2022:  - Esophagogastric landmarks identified. - 1 cm hiatal hernia. - Benign appearing blebs found in the lower esophagus. - Dilation of the esophagus performed to 17mm with Savary, mucosal wrent appreciated just inferior to the UES - Biopsies of the esophagus obtained - Normal stomach. - Mucosal changes in the duodenum as outlined. Biopsied.  1. Surgical [P], 2nd portion of duodenum REACTIVE DUODENAL MUCOSA WITH PROMINENT BRUNNER'S GLANDS NEGATIVE FOR INTRAEPITHELIAL LYMPHOCYTOSIS 2. Surgical [P], mid esophagus, distal esophagus and random sites REACTIVE SQUAMOUS MUCOSA NEGATIVE FOR GLANDULAR EPITHELIUM, EOSINOPHILS, DYSPLASIA AND CARCINOMA  Has history of jackhammer esophagus as seen on esophageal manometry in February 2022.  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- benign   Iron  deficiency anemia    Migraine headache    Non-compliance    Peptic ulcer disease    Thyroid  disease    Past Surgical History:  Procedure Laterality Date   ABDOMINAL EXPLORATION SURGERY     w/bso    BREAST BIOPSY     CARDIAC CATHETERIZATION  2009   mild non obstructive CAD   CHOLECYSTECTOMY     COLONOSCOPY     ESOPHAGEAL MANOMETRY N/A 08/01/2020   Procedure: ESOPHAGEAL MANOMETRY (EM);  Surgeon: Leigh Elspeth SQUIBB, MD;  Location: [facility];  Service: Gastroenterology;  Laterality: N/A;   KNEE SURGERY  2005    left knee   LEFT HEART CATH AND CORONARY ANGIOGRAPHY N/A 07/17/2017   Procedure: LEFT HEART CATH AND CORONARY ANGIOGRAPHY;  Surgeon: Jordan, Peter M, MD;  Location: [facility] INVASIVE CV LAB;  Service: Cardiovascular;  Laterality: N/A;   LEFT HEART CATH AND CORONARY ANGIOGRAPHY N/A 09/01/2021   Procedure: LEFT HEART CATH AND CORONARY ANGIOGRAPHY;  Surgeon: Verlin Lonni BIRCH, MD;  Location: [facility] INVASIVE CV LAB;  Service: Cardiovascular;  Laterality: N/A;   POLYPECTOMY     TOTAL ABDOMINAL HYSTERECTOMY     UPPER GASTROINTESTINAL ENDOSCOPY  reports that she quit smoking about 29 years ago. Her smoking use included cigarettes. She started smoking about 45 years ago. She has a 9 pack-year smoking history. She has never used smokeless tobacco. She reports current alcohol use. She reports that she does not use drugs. family history includes Arthritis in her brother; CAD in her brother; Diabetes in her father, maternal grandmother, and sister; Fibromyalgia in her sister and  sister; Hypertension in her father and mother; Kidney disease in her father; Lung cancer in her maternal grandfather; Migraines in her brother and sister; Prostate cancer in her father; Rheum arthritis in her mother. Allergies  Allergen Reactions   Ace Inhibitors Cough   Celebrex [Celecoxib] Other (See Comments)    makes me bleed   Diovan  [Valsartan ]     angioedema      Outpatient Encounter Medications as of 06/27/2023  Medication Sig   albuterol  (VENTOLIN  HFA) 108 (90 Base) MCG/ACT inhaler INHALE 2 PUFFS BY MOUTH 4 TIMES DAILY   amLODipine  (NORVASC ) 10 MG tablet Take 1 tablet by mouth once daily   aspirin  81 MG chewable tablet Chew 1 tablet (81 mg total) by mouth daily.   clobetasol  (TEMOVATE ) 0.05 % external solution Apply 1 application topically 2 (two) times daily.   clotrimazole -betamethasone  (LOTRISONE ) cream Apply 1 application topically 2 (two) times daily.   DULoxetine  (CYMBALTA ) 30 MG capsule Take 1 capsule (30 mg total) by mouth daily.   Evolocumab  (REPATHA  SURECLICK) 140 MG/ML SOAJ Inject 1 pen (140 mg) subcutaneously every 14 days   fluticasone  (FLONASE ) 50 MCG/ACT nasal spray Place 2 sprays into both nostrils daily.   furosemide  (LASIX ) 20 MG tablet Take 1 tablet (20 mg total) by mouth daily as needed (for shortness of breath or weight gain of 2 lbs overnight or 5 lbs in a week).   hyoscyamine  (LEVSIN ) 0.125 MG tablet Take 1 tablet (0.125 mg total) by mouth every 4 (four) hours as needed for cramping (diarrhea,nausea).   isosorbide  mononitrate (IMDUR ) 60 MG 24 hr tablet TAKE 1 & 1/2 (ONE & ONE-HALF) TABLETS BY MOUTH ONCE DAILY   loratadine  (CLARITIN ) 10 MG tablet Take 1 tablet (10 mg total) by mouth daily.   methimazole  (TAPAZOLE ) 5 MG tablet Take as directed by MD   methocarbamol  (ROBAXIN ) 500 MG tablet Take 1 tablet (500 mg total) by mouth every 8 (eight) hours as needed for muscle spasms.   metoCLOPramide  (REGLAN ) 5 MG tablet Take 1 tablet (5 mg total) by mouth 3 (three)  times daily before meals. Schedule with GI for further refills   ondansetron  (ZOFRAN ) 4 MG tablet Take 1 tab by mouth every 6 hours as needed for nausea.   ondansetron  (ZOFRAN -ODT) 8 MG disintegrating tablet Take 8 mg by mouth every 8 (eight) hours as needed for vomiting or nausea.   potassium chloride  SA (KLOR-CON  M20) 20 MEQ tablet Take 1 tablet by mouth once daily   rosuvastatin  (CRESTOR ) 40 MG tablet Take 1 tablet by mouth once daily   XIIDRA 5 % SOLN Apply 1 drop to eye 2 (two) times daily.   alclomethasone (ACLOVATE ) 0.05 % cream Apply topically 2 (two) times daily as needed (Rash). (Patient not taking: Reported on 04/26/2023)   [DISCONTINUED] Cholecalciferol (VITAMIN D3) 75 MCG (3000 UT) TABS Take 1 tablet by mouth daily. (Patient not taking: Reported on 06/27/2023)   [DISCONTINUED] cyclobenzaprine  (FLEXERIL ) 5 MG tablet 5 mg 3 (three) times daily as needed. (Patient not taking: Reported on 06/27/2023)   [DISCONTINUED] metoprolol  succinate (TOPROL -XL) 100 MG  24 hr tablet Take 1 tablet (100 mg total) by mouth daily. Take with or immediately following a meal. (Patient not taking: Reported on 06/27/2023)   [DISCONTINUED] neomycin -polymyxin-hydrocortisone (CORTISPORIN) OTIC solution Place 3 drops into the right ear 4 (four) times daily. (Patient not taking: Reported on 06/27/2023)   [DISCONTINUED] nitroGLYCERIN  (NITROSTAT ) 0.4 MG SL tablet DISSOLVE ONE TABLET UNDER THE TONGUE EVERY 5 MINUTES AS NEEDED FOR CHEST PAIN.  DO NOT EXCEED A TOTAL OF 3 DOSES IN 15 MINUTES (Patient not taking: Reported on 06/27/2023)   [DISCONTINUED] nystatin  (MYCOSTATIN /NYSTOP ) powder Apply 1 application topically 2 (two) times daily. Beneath both breasts (Patient not taking: Reported on 04/26/2023)   [DISCONTINUED] valsartan -hydrochlorothiazide  (DIOVAN  HCT) 160-12.5 MG tablet Take 1 tablet by mouth daily. (Patient not taking: Reported on 06/27/2023)   No facility-administered encounter medications on file as of 06/27/2023.    REVIEW  OF SYSTEMS  : All other systems reviewed and negative except where noted in the History of Present Illness.   PHYSICAL EXAM: BP 132/78   Pulse 84   Ht 5' 4 (1.626 m)   Wt 228 lb 3.2 oz (103.5 kg)   SpO2 97%   BMI 39.17 kg/m  General: Well developed female in no acute distress Head: Normocephalic and atraumatic Eyes:  Sclerae anicteric, conjunctiva pink. Ears: Normal auditory acuity Lungs: Clear throughout to auscultation; no W/R/R. Heart: Regular rate and rhythm; no M/R/G. Abdomen: Soft, non-distended.  BS present.  Some upper abdominal TTP. Musculoskeletal: Symmetrical with no gross deformities  Skin: No lesions on visible extremities Extremities: No edema  Neurological: Alert oriented x 4, grossly non-focal Psychological:  Alert and cooperative. Normal mood and affect  ASSESSMENT AND PLAN: *GERD and left upper quadrant abdominal pain: Not on any reflux medication, but using frequent Tums and Alka-Seltzer.  EGD just performed last year.  CT scan abdomen and pelvis with contrast September 2023 also unremarkable for cause of left sided pain.  Will start her on pantoprazole  40 mg daily.  Prescription sent to pharmacy.  She will call us  back in 4 to 6 weeks and give us  an update on her symptoms.  Had a history emptying scan in June 2013 that showed mild gastric retention at 2 hours.  Has metoclopramide  on her medication list.  CC:  O'Sullivan, Melissa, NP

## 2023-07-17 ENCOUNTER — Encounter: Payer: Self-pay | Admitting: Family

## 2023-07-17 ENCOUNTER — Ambulatory Visit: Payer: Self-pay | Admitting: Family

## 2023-07-17 NOTE — Telephone Encounter (Signed)
  Chief Complaint: bilateral earache Symptoms: bilateral earache radiates to neck just below ears Frequency: x 3 days Pertinent Negatives: Patient denies cough, runny nose, headache, fever, swelling, dizziness, visual changes, ear discharge Disposition: [] ED /[] Urgent Care (no appt availability in office) / [x] Appointment(In office/virtual)/ []  [facility]/ [] Home Care/ [] Refused Recommended Disposition /[] [place] [facility]/ []  Follow-up with PCP Additional Notes: Patient agreeable to acute visit tomorrow with available provider.    Copied from CRM [id number]. Topic: Clinical - Red Word Triage >> Jul 17, 2023  3:13 PM Gurney Maxin H wrote: [facility] that prompted transfer to Nurse Triage: Pain in ears running down to sides of neck Reason for Disposition  Earache  (Exceptions: brief ear pain of < 60 minutes duration, earache occurring during air travel  Answer Assessment - Initial Assessment Questions 1. LOCATION: "Which ear is involved?"     Both ears.  2. ONSET: "When did the ear start hurting"      Since Monday.  3. SEVERITY: "How bad is the pain?"  (Scale 1-10; mild, moderate or severe)   - MILD (1-3): doesn't interfere with normal activities    - MODERATE (4-7): interferes with normal activities or awakens from sleep    - SEVERE (8-10): excruciating pain, unable to do any normal activities      3/10.  4. URI SYMPTOMS: "Do you have a runny nose or cough?"     Denies.  5. FEVER: "Do you have a fever?" If Yes, ask: "What is your temperature, how was it measured, and when did it start?"     Denies.  6. CAUSE: "Have you been swimming recently?", "How often do you use Q-TIPS?", "Have you had any recent air travel or scuba diving?"     Denies submerging under water or using Qtips.  7. OTHER SYMPTOMS: "Do you have any other symptoms?" (e.g., headache, stiff neck, dizziness, vomiting, runny nose, decreased hearing)     Sharp pain that radiates into neck below ears at  night.  Protocols used: Davina Poke

## 2023-07-17 NOTE — Progress Notes (Deleted)
 Office Visit Note  Patient: [name]             Date of Birth: Mar 29, 1961           MRN: [id number]             PCP: Sandford Craze, NP Referring: Sandford Craze, NP Visit Date: 07/26/2023   Subjective:  No chief complaint on file.   History of Present Illness: [name] is a 63 y.o. female here for follow up with continued joint pain in multiple areas.    Previous HPI 04/26/2023 [name] is a 63 y.o. female here for follow up with continued joint pain in multiple areas.  Lab testing at initial visit showed positive ANA at 1: 80 titer decreased from 1: 320 previously.  Sedimentation rate was still just slightly above normal.  Other antibodies all negative.  Worst area is in her back.  Gets radiation into the legs with prolonged standing this is worse on the right side.  Walking is better and sitting is better compared to standing still.  She has had similar symptoms in the past says this was associated with lumbar disc disease.  Does not see any spine specialist for years.  She has previously gotten good relief with her chiropractor but because pain has been more manageable was not following up with them regularly.     Previous HPI 03/22/23 [name] is a 63 y.o. female here for evaluation of joint pain with positive ANA and ESR.  Medical history is also significant for thyrotoxicosis on treatment with methimazole, esophageal dysphagia and stricture, gastroparesis, and type 2 diabetes.  Some degree of chronic joint pain most prominent in her low back and hips that is not new but symptoms increased in the past year.  She develops area of numbness on the lateral portion of the thigh that occurs after prolonged standing.  Only occasional pain down the back of the leg but she does state very sore in the low back and this gets worse after prolonged walking or standing.  She has morning stiffness lasting about an hour.  Not associated with any significant  new illness or injury.  She does have a previous history of left knee injury that was repaired arthroscopically many years ago and gets some pain with increased use in that area.  Does not take any daily medications for joint pain.  She previously saw orthopedic specialist with back steroid injections that helped for a short duration.  She previously saw physical therapy years ago for back pain but did not feel like the treatment helped. Besides joint problems she does describe chronic eye dryness.  Was not found to have any Graves ophthalmopathy with previous evaluations.  Denies oral or nasal ulcers, Raynaud's syndrome, cervical or axillary lymphadenopathy, or abnormal bruising or bleeding.  She has patchy hyperpigmented spots on the distal arms and legs both sides she described as her birthmark is not new or any recent change. Chronic dysphagia previously seeing Dr. Adela Lank imaging and esophageal manometry consistent with hypercontractile esophagus. She is currently off the methimazole due to running out of medication just recently.  From chart review looks like she was recommended to have repeat thyroid parameters checked in July but these labs were never collected sounds like she was not aware or does not member at this time, has been taking 5 mg once daily refill was requested 9/16.   Labs reviewed ANA 1:80 speckled RF neg ESR 58  No Rheumatology ROS completed.   PMFS History:  Patient Active Problem List   Diagnosis Date Noted   Cramping of hands 04/16/2023   Positive ANA (antinuclear antibody) 03/22/2023   Sedimentation rate elevation 03/22/2023   Skin cyst 11/02/2022   Arthralgia 11/02/2022   Bulging of lumbar intervertebral disc 09/24/2022   Lumbar radiculopathy 09/24/2022   Left arm pain 08/14/2022   Otitis externa of right ear 06/13/2022   Arthritis 05/11/2022   Edema 01/12/2022   Controlled type 2 diabetes mellitus without complication, without long-term current use of  insulin (HCC) 12/01/2021   Facial abscess 09/15/2021   Non-functioning tympanostomy tube 08/25/2021   Cough 08/25/2021   Bilateral impacted cerumen 06/30/2021   Coronary artery disease of native heart with stable angina pectoris (HCC) 06/30/2021   Intertrigo 05/26/2021   DOE (dyspnea on exertion) 12/30/2020   Esophageal dysphagia    Hypercontractile esophagus    Colitis 02/04/2018   Aortic atherosclerosis (HCC) 07/17/2017   Chest pain 07/16/2017   Insomnia 01/12/2017   Breast cyst 06/09/2012   Hyperthyroidism 02/29/2012   Constipation 04/18/2011   Can't get food down 04/18/2011   Ulnar neuropathy 03/30/2011   Low back pain 10/18/2010   Diarrhea 11/01/2009   Fibromyalgia 07/20/2009   Hyperlipidemia 05/26/2009   Vitamin D deficiency 05/23/2009   Polyarthralgia 04/12/2009   Iron deficiency anemia 10/25/2008   GERD 10/25/2008   LIVER HEMANGIOMA 01/01/2008   ESOPHAGEAL STRICTURE 01/01/2008   Gastroparesis 01/01/2008   Anemia 12/08/2007   Allergic rhinitis 12/08/2007   LUQ abdominal pain 09/23/2007   Migraine headache 08/11/2007   HIATAL HERNIA 08/11/2007   OVARIAN CYST 08/11/2007   Essential hypertension 05/06/2007    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/08- benign   Iron deficiency anemia    Migraine headache    Non-compliance    Peptic ulcer disease    Thyroid disease     Family History  Problem Relation Age of Onset   Hypertension Mother    Rheum arthritis Mother    Hypertension Father    Diabetes Father     Prostate cancer Father    Kidney disease Father    Diabetes Sister    Fibromyalgia Sister    Diabetes Maternal Grandmother    Lung cancer Maternal Grandfather    Arthritis Brother    Migraines Brother    CAD Brother    Fibromyalgia Sister    Migraines Sister    Colon cancer Neg Hx    Thyroid disease Neg Hx    Colon polyps Neg Hx    Past Surgical History:  Procedure Laterality Date   ABDOMINAL EXPLORATION SURGERY     w/bso    BREAST BIOPSY     CARDIAC CATHETERIZATION  2009   mild non obstructive CAD   CHOLECYSTECTOMY     COLONOSCOPY     ESOPHAGEAL MANOMETRY N/A 08/01/2020   Procedure: ESOPHAGEAL MANOMETRY (EM);  Surgeon: Benancio Deeds,  MD;  Location: [facility];  Service: Gastroenterology;  Laterality: N/A;   KNEE SURGERY  2005    left knee   LEFT HEART CATH AND CORONARY ANGIOGRAPHY N/A 07/17/2017   Procedure: LEFT HEART CATH AND CORONARY ANGIOGRAPHY;  Surgeon: Swaziland, Peter M, MD;  Location: [facility] INVASIVE CV LAB;  Service: Cardiovascular;  Laterality: N/A;   LEFT HEART CATH AND CORONARY ANGIOGRAPHY N/A 09/01/2021   Procedure: LEFT HEART CATH AND CORONARY ANGIOGRAPHY;  Surgeon: Kathleene Hazel, MD;  Location: [facility] INVASIVE CV LAB;  Service: Cardiovascular;  Laterality: N/A;   POLYPECTOMY     TOTAL ABDOMINAL HYSTERECTOMY     UPPER GASTROINTESTINAL ENDOSCOPY     Social History   Social History Narrative   Widowed   Has 2 grown children.  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    Former Smoker Quit tobacco in 1996.  She was a pack a day smoker for approximately 10 years.    Alcohol use-yes: Social    Daily Caffeine Use:6 pack of pepsi daily     Illicit Drug Use - no    Patient does not get regular exercise.       Smoking Status:  quit   Immunization History  Administered Date(s) Administered   Fluad Quad(high Dose 65+) 03/31/2019   Influenza Split 04/30/2011   Influenza Whole 04/19/2006, 04/17/2007, 03/08/2009,  03/01/2010   Influenza, Quadrivalent, Recombinant, Inj, Pf 03/31/2019   Influenza, Seasonal, Injecte, Preservative Fre 05/30/2012   Influenza,inj,Quad PF,6+ Mos 02/15/2014, 03/08/2015, 04/30/2016, 04/24/2017, 04/04/2018, 06/10/2020   Influenza-Unspecified 02/23/2021   PFIZER(Purple Top)SARS-COV-2 Vaccination 09/07/2019, 09/29/2019, 05/20/2020   Pfizer Covid-19 Vaccine Bivalent Booster 60yrs & up 02/23/2021   Pneumococcal Conjugate-13 04/04/2018   Pneumococcal Polysaccharide-23 03/08/2009, 09/23/2020   Td 05/17/1999   Tdap 04/30/2011   Zoster Recombinant(Shingrix) 03/31/2019, 08/02/2019     Objective: Vital Signs: There were no vitals taken for this visit.   Physical Exam   Musculoskeletal Exam: ***  CDAI Exam: CDAI Score: -- Patient Global: --; Provider Global: -- Swollen: --; Tender: -- Joint Exam 07/26/2023   No joint exam has been documented for this visit   There is currently no information documented on the homunculus. Go to the Rheumatology activity and complete the homunculus joint exam.  Investigation: No additional findings.  Imaging: No results found.  Recent Labs: Lab Results  Component Value Date   WBC 6.8 04/16/2023   HGB 11.1 (L) 04/16/2023   PLT 233.0 04/16/2023   NA 142 04/16/2023   K 3.8 04/16/2023   CL 107 04/16/2023   CO2 26 04/16/2023   GLUCOSE 127 (H) 04/16/2023   BUN 12 04/16/2023   CREATININE 0.81 04/16/2023   BILITOT 0.2 04/16/2023   ALKPHOS 99 04/16/2023   AST 16 04/16/2023   ALT 20 04/16/2023   PROT 6.8 04/16/2023   ALBUMIN 4.0 04/16/2023   CALCIUM 9.1 04/16/2023   GFRAA >60 04/06/2019    Speciality Comments: No specialty comments available.  Procedures:  No procedures performed Allergies: Ace inhibitors, Celebrex [celecoxib], and Diovan [valsartan]   Assessment / Plan:     Visit Diagnoses: No diagnosis found.  ***  Orders: No orders of the defined types were placed in this encounter.  No orders of the defined types  were placed in this encounter.    Follow-Up Instructions: No follow-ups on file.   Metta Clines, RT  Note - This record has been created using [employer].  Chart creation errors have been  sought, but may not always  have been located. Such creation errors do not reflect on  the standard of medical care.

## 2023-07-17 NOTE — Telephone Encounter (Signed)
Pt is scheduled to see MO tomorrow.

## 2023-07-18 ENCOUNTER — Encounter: Payer: Self-pay | Admitting: Family

## 2023-07-18 ENCOUNTER — Ambulatory Visit: Payer: 59 | Admitting: Family Medicine

## 2023-07-18 ENCOUNTER — Encounter: Payer: Self-pay | Admitting: Family Medicine

## 2023-07-18 VITALS — BP 138/70 | HR 74 | Temp 97.9°F | Ht 64.0 in | Wt 232.0 lb

## 2023-07-18 DIAGNOSIS — H9203 Otalgia, bilateral: Secondary | ICD-10-CM | POA: Diagnosis not present

## 2023-07-18 MED ORDER — AMOXICILLIN-POT CLAVULANATE 875-125 MG PO TABS: 1.0000 | ORAL_TABLET | Freq: Two times a day (BID) | ORAL | 0 refills | Status: AC

## 2023-07-18 MED ORDER — PREDNISONE 20 MG PO TABS: 20.0000 mg | ORAL_TABLET | Freq: Every day | ORAL | 0 refills | Status: AC

## 2023-07-18 NOTE — Progress Notes (Signed)
Acute Office Visit  Subjective:     Patient ID: [name], female    DOB: 10-27-1960, 63 y.o.   MRN: [id number]  Chief Complaint  Patient presents with   Ear Pain    HPI Patient is in today for  bilateral ear pain.    Discussed the use of AI scribe software for clinical note transcription with the patient, who gave verbal consent to proceed.  History of Present Illness   The patient presents with a chief complaint of deep, internal pain extending from the ears down to the shoulders, more pronounced on the right side. The pain, which started approximately four days ago, is severe enough to disrupt sleep and is rated as a six out of ten. The patient denies any associated cold, flu, or allergy symptoms such as runny nose, headache, or sore throat. There is no reported swelling, heat, or tenderness in the affected area, and hearing remains normal. Over-the-counter Tylenol has been ineffective in managing the pain.  The patient also reports the presence of small nodules or cysts on the skin, including in the ears, which have been present since the previous year. These nodules are not painful and do not drain any fluid. The patient is scheduled to see a dermatologist for further evaluation of these nodules next month.  In the past, the patient has had tubes inserted in the ears due to frequent ear infections.             ROS All review of systems negative except what is listed in the HPI      Objective:    BP 138/70   Pulse 74   Temp 97.9 F (36.6 C) (Oral)   Ht 5\' 4"  (1.626 m)   Wt 232 lb (105.2 kg)   SpO2 99%   BMI 39.82 kg/m    Physical Exam Vitals reviewed.  Constitutional:      Appearance: Normal appearance.  HENT:     Ears:      Comments: Both external ears/pinnas with many small papules, no signs of abscess Both TMs bulging and cloudy, mild erythema  Lymphadenopathy:     Cervical: Cervical adenopathy present.  Neurological:     Mental Status:  She is alert and oriented to person, place, and time.  Psychiatric:        Mood and Affect: Mood normal.        Behavior: Behavior normal.        Thought Content: Thought content normal.        Judgment: Judgment normal.     No results found for any visits on 07/18/23.      Assessment & Plan:   Problem List Items Addressed This Visit   None Visit Diagnoses       Otalgia of both ears    -  Primary   Relevant Medications   amoxicillin-clavulanate (AUGMENTIN) 875-125 MG tablet   predniSONE (DELTASONE) 20 MG tablet       Severe pain from ears to shoulders, worse on the right side, for the past 3-4 days. No associated symptoms of infection. Eardrum appears cloudy on examination, suggestive of possible ear infection. -Prescribe Augmentin. Adding prednisone for severity of discomfort and radiating pain/inflammation - states she tolerates well. -Consider referral to ENT if no improvement. -Keep upcoming appointment to evaluate cysts/papules on both ears         Meds ordered this encounter  Medications   amoxicillin-clavulanate (AUGMENTIN) 875-125 MG tablet    Sig: Take  1 tablet by mouth 2 (two) times daily for 7 days.    Dispense:  14 tablet    Refill:  0    Supervising Provider:   Danise Edge A [[id number]]   predniSONE (DELTASONE) 20 MG tablet    Sig: Take 1 tablet (20 mg total) by mouth daily with breakfast for 5 days.    Dispense:  5 tablet    Refill:  0    Supervising Provider:   Danise Edge A [[id number]]    Return if symptoms worsen or fail to improve.  Clayborne Dana, NP

## 2023-07-19 ENCOUNTER — Other Ambulatory Visit: Payer: Self-pay | Admitting: Internal Medicine

## 2023-07-19 ENCOUNTER — Other Ambulatory Visit: Payer: Self-pay | Admitting: Family

## 2023-07-19 DIAGNOSIS — E0590 Thyrotoxicosis, unspecified without thyrotoxic crisis or storm: Secondary | ICD-10-CM

## 2023-07-26 ENCOUNTER — Ambulatory Visit: Payer: 59 | Admitting: Internal Medicine

## 2023-07-26 DIAGNOSIS — M2550 Pain in unspecified joint: Secondary | ICD-10-CM

## 2023-07-27 ENCOUNTER — Encounter: Payer: Self-pay | Admitting: Family

## 2023-07-29 ENCOUNTER — Telehealth: Payer: Self-pay

## 2023-07-29 NOTE — Telephone Encounter (Signed)
Vaginal burning and discomfort. Patient tried metrogel but not better.

## 2023-07-29 NOTE — Telephone Encounter (Signed)
Copied from CRM [id number]. Topic: Clinical - Medication Question >> Jul 29, 2023 10:16 AM Kathryne Eriksson wrote: Reason for CRM: Request For Medication Call In >> Jul 29, 2023 10:17 AM Kathryne Eriksson wrote: Patient is requesting to have a prescription called in for a yeast infection. Any further questions, feel free to call [phone]

## 2023-07-30 ENCOUNTER — Other Ambulatory Visit: Payer: Self-pay | Admitting: Internal Medicine

## 2023-07-30 DIAGNOSIS — I2510 Atherosclerotic heart disease of native coronary artery without angina pectoris: Secondary | ICD-10-CM

## 2023-07-30 DIAGNOSIS — E785 Hyperlipidemia, unspecified: Secondary | ICD-10-CM

## 2023-07-30 MED ORDER — FLUCONAZOLE 150 MG PO TABS: ORAL_TABLET | ORAL | 0 refills | Status: DC

## 2023-07-30 NOTE — Telephone Encounter (Signed)
Spoke to pt and advised her RE: diflucan rx and need to schedule a follow up visit if this does not help her symptoms. She states that she is seeing ENT on 2/14 for her ear issue.

## 2023-07-30 NOTE — Addendum Note (Signed)
Addended by: Sandford Craze on: 07/30/2023 12:34 PM   Modules accepted: Orders

## 2023-08-02 ENCOUNTER — Encounter: Payer: Self-pay | Admitting: Internal Medicine

## 2023-08-02 ENCOUNTER — Encounter: Payer: Self-pay | Admitting: Family

## 2023-08-02 ENCOUNTER — Ambulatory Visit (INDEPENDENT_AMBULATORY_CARE_PROVIDER_SITE_OTHER): Payer: 59 | Admitting: Internal Medicine

## 2023-08-02 VITALS — BP 120/70 | HR 68 | Ht 64.0 in | Wt 231.8 lb

## 2023-08-02 DIAGNOSIS — E0590 Thyrotoxicosis, unspecified without thyrotoxic crisis or storm: Secondary | ICD-10-CM

## 2023-08-02 LAB — TSH: TSH: 2.38 m[IU]/L (ref 0.40–4.50)

## 2023-08-02 LAB — T3, FREE: T3, Free: 3.4 pg/mL (ref 2.3–4.2)

## 2023-08-02 LAB — T4, FREE: Free T4: 1.2 ng/dL (ref 0.8–1.8)

## 2023-08-02 MED ORDER — METHIMAZOLE 5 MG PO TABS: ORAL_TABLET | ORAL | 3 refills | Status: DC

## 2023-08-02 NOTE — Patient Instructions (Signed)
Please continue Methimazole 5 mg daily.  Please stop at the lab.  You should have an endocrinology follow-up appointment in 1 year.  

## 2023-08-02 NOTE — Progress Notes (Addendum)
 Patient ID: [name], female   DOB: 1960/11/25, 63 y.o.   MRN: [id number]  HPI  [name] is a 63 y.o.-year-old femaleemale, returning for follow-up for thyrotoxicosis, likely due to mild Graves' disease.  She previously saw Dr. Kassie, but last visit with me 1 year ago.  Interim history: She denies tremors, palpitations, heat intolerance, unintentional weight loss, anxiety. She has B otitis >> on Prednisone  last week: taper (5 days) - finished 9 days ago.  She still has otalgia and is preparing to see ENT.  She was diagnosed with thyrotoxicosis in 2012.  She did not have thyroid  imaging.  She is on methimazole  5 mg daily, tolerated well.  I reviewed pt's thyroid  tests: Lab Results  Component Value Date   TSH 1.18 03/22/2023   TSH 4.39 10/19/2022   TSH 0.97 07/27/2022   TSH 4.87 12/29/2021   TSH 1.56 05/05/2021   TSH 2.65 10/21/2020   TSH 2.16 04/08/2020   TSH 3.44 10/08/2019   TSH 2.87 04/07/2019   TSH 3.09 11/04/2018   FREET4 1.1 03/22/2023   FREET4 0.68 10/19/2022   FREET4 0.88 07/27/2022   FREET4 0.76 12/29/2021   FREET4 1.03 05/05/2021   FREET4 0.70 10/21/2020   FREET4 0.82 04/08/2020   FREET4 0.79 10/08/2019   FREET4 0.77 04/07/2019   FREET4 0.67 11/04/2018   T3FREE 3.3 03/22/2023   T3FREE 3.5 10/19/2022   T3FREE 2.9 07/27/2022   T3FREE 3.2 01/21/2017   Antithyroid antibodies: Lab Results  Component Value Date   TSI <89 07/27/2022   Pt denies: - feeling nodules in neck - hoarseness - dysphagia - choking  Pt does not have a FH of thyroid  ds. No FH of thyroid  cancer. No h/o radiation tx to head or neck. + steroid use (see above). No herbal supplements. No Biotin use.  Pt. also has a history of CAD, well-controlled diabetes - managed by PCP, fatty liver, HL, HTN, hepatic hemangioma, iron  deficiency anemia, GERD, endometriosis, ovarian cysts, migraines, osteoarthritis, low back pain, vitamin D  deficiency.  ROS:  + see HPI  Past Medical History:   Diagnosis Date   Allergy    allergic rhinitis   Arthritis    Atypical chest pain    Constipation    Cyst, ovarian    Diabetes mellitus, type II (HCC)    Elevated alkaline phosphatase level    Endometriosis    Esophageal stricture    Fatty liver    Gastroparesis    GERD (gastroesophageal reflux disease)    Heart murmur    Hepatic hemangioma    Hyperlipidemia    Hypertension    Hypertrophic condition of skin    acrokeratoelastoidosis- s/p 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 1/08- benign   Iron  deficiency anemia    Migraine headache    Non-compliance    Peptic ulcer disease    Thyroid  disease    Past Surgical History:  Procedure Laterality Date   ABDOMINAL EXPLORATION SURGERY     w/bso    BREAST BIOPSY     CARDIAC CATHETERIZATION  2009   mild non obstructive CAD   CHOLECYSTECTOMY     COLONOSCOPY     ESOPHAGEAL MANOMETRY N/A 08/01/2020   Procedure: ESOPHAGEAL MANOMETRY (EM);  Surgeon: Leigh Elspeth SQUIBB, MD;  Location: [facility];  Service: Gastroenterology;  Laterality: N/A;   KNEE SURGERY  2005    left knee   LEFT HEART CATH AND CORONARY ANGIOGRAPHY N/A 07/17/2017   Procedure: LEFT HEART CATH AND CORONARY ANGIOGRAPHY;  Surgeon: Jordan, Peter M,  MD;  Location: [facility];  Service: Cardiovascular;  Laterality: N/A;   LEFT HEART CATH AND CORONARY ANGIOGRAPHY N/A 09/01/2021   Procedure: LEFT HEART CATH AND CORONARY ANGIOGRAPHY;  Surgeon: Verlin Lonni BIRCH, MD;  Location: [facility];  Service: Cardiovascular;  Laterality: N/A;   POLYPECTOMY     TOTAL ABDOMINAL HYSTERECTOMY     UPPER GASTROINTESTINAL ENDOSCOPY     Social History   Socioeconomic History   Marital status: Widowed    Spouse name: Not on file   Number of children: 2   Years of education: Not on file   Highest education level: Not on file  Occupational History   Occupation: DISABILITY    Employer: UNEMPLOYED  Tobacco Use   Smoking status: Former    Current packs/day: 0.00    Average packs/day: 0.5  packs/day for 18.0 years (9.0 ttl pk-yrs)    Types: Cigarettes    Start date: 07/29/1977    Quit date: 06/25/1994    Years since quitting: 29.1   Smokeless tobacco: Never   Tobacco comments:    quit 19 years ago  Vaping Use   Vaping status: Never Used  Substance and Sexual Activity   Alcohol use: Yes    Alcohol/week: 0.0 standard drinks of alcohol    Comment: social drinker   Drug use: No   Sexual activity: Not Currently  Other Topics Concern   Not on file  Social History Narrative   Widowed   Has 2 grown children.  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    Former Smoker Quit tobacco in 1996.  She was a pack a day smoker for approximately 10 years.    Alcohol use-yes: Social    Daily Caffeine Use:6 pack of pepsi daily     Illicit Drug Use - no    Patient does not get regular exercise.       Smoking Status:  quit   Social Drivers of Health   Financial Resource Strain: Low Risk  (01/16/2023)   Overall Financial Resource Strain (CARDIA)    Difficulty of Paying Living Expenses: Not hard at all  Food Insecurity: No Food Insecurity (01/16/2023)   Hunger Vital Sign    Worried About Running Out of Food in the Last Year: Never true    Ran Out of Food in the Last Year: Never true  Transportation Needs: No Transportation Needs (01/16/2023)   PRAPARE - Administrator, Civil Service (Medical): No    Lack of Transportation (Non-Medical): No  Physical Activity: Inactive (06/10/2023)   Exercise Vital Sign    Days of Exercise per Week: 0 days    Minutes of Exercise per Session: 0 min  Stress: No Stress Concern Present (01/16/2023)   [employer] of Occupational Health - Occupational Stress Questionnaire    Feeling of Stress : Not at all  Social Connections: Moderately Isolated (06/10/2023)   Social Connection and Isolation Panel [NHANES]    Frequency of Communication with Friends and Family: More than three times a week     Frequency of Social Gatherings with Friends and Family: More than three times a week    Attends Religious Services: More than 4 times per year    Active Member of [employer] or Organizations: No    Attends Banker Meetings: Never    Marital Status: Widowed  Intimate Partner Violence: Not At Risk (06/08/2022)   Humiliation, Afraid, Rape, and Kick questionnaire  Fear of Current or Ex-Partner: No    Emotionally Abused: No    Physically Abused: No    Sexually Abused: No   Current Outpatient Medications on File Prior to Visit  Medication Sig Dispense Refill   albuterol  (VENTOLIN  HFA) 108 (90 Base) MCG/ACT inhaler INHALE 2 PUFFS BY MOUTH 4 TIMES DAILY 9 g 0   alclomethasone (ACLOVATE ) 0.05 % cream Apply topically 2 (two) times daily as needed (Rash). 180 g 3   amLODipine  (NORVASC ) 10 MG tablet Take 1 tablet by mouth once daily 90 tablet 0   aspirin  81 MG chewable tablet Chew 1 tablet (81 mg total) by mouth daily.     clobetasol  (TEMOVATE ) 0.05 % external solution Apply 1 application topically 2 (two) times daily. 50 mL 6   clotrimazole -betamethasone  (LOTRISONE ) cream Apply 1 application topically 2 (two) times daily. 30 g 0   DULoxetine  (CYMBALTA ) 30 MG capsule Take 1 capsule (30 mg total) by mouth daily. 30 capsule 2   Evolocumab  (REPATHA  SURECLICK) 140 MG/ML SOAJ INJECT 140 MG SUBCUTANEOUSLY EVERY 14 DAYS 6 mL 0   fluconazole  (DIFLUCAN ) 150 MG tablet Take 1 tablet by mouth now- may repeat in 3 days if needed 2 tablet 0   fluticasone  (FLONASE ) 50 MCG/ACT nasal spray Place 2 sprays into both nostrils daily. 16 g 1   furosemide  (LASIX ) 20 MG tablet Take 1 tablet (20 mg total) by mouth daily as needed (for shortness of breath or weight gain of 2 lbs overnight or 5 lbs in a week). 45 tablet 3   hyoscyamine  (LEVSIN ) 0.125 MG tablet Take 1 tablet (0.125 mg total) by mouth every 4 (four) hours as needed for cramping (diarrhea,nausea). 90 tablet 1   isosorbide  mononitrate (IMDUR ) 60 MG 24 hr  tablet TAKE 1 & 1/2 (ONE & ONE-HALF) TABLETS BY MOUTH ONCE DAILY 135 tablet 3   loratadine  (CLARITIN ) 10 MG tablet Take 1 tablet (10 mg total) by mouth daily. 10 tablet 0   methimazole  (TAPAZOLE ) 5 MG tablet TAKE AS DIRECTED BY MD 30 tablet 0   methocarbamol  (ROBAXIN ) 500 MG tablet Take 1 tablet (500 mg total) by mouth every 8 (eight) hours as needed for muscle spasms. 30 tablet 0   metoCLOPramide  (REGLAN ) 5 MG tablet Take 1 tablet (5 mg total) by mouth 3 (three) times daily before meals. Schedule with GI for further refills 90 tablet 0   ondansetron  (ZOFRAN ) 4 MG tablet Take 1 tab by mouth every 6 hours as needed for nausea. 100 tablet 1   ondansetron  (ZOFRAN -ODT) 8 MG disintegrating tablet Take 8 mg by mouth every 8 (eight) hours as needed for vomiting or nausea.     pantoprazole  (PROTONIX ) 40 MG tablet Take 1 tablet (40 mg total) by mouth daily. 30 tablet 5   potassium chloride  SA (KLOR-CON  M20) 20 MEQ tablet Take 1 tablet by mouth once daily 30 tablet 11   rosuvastatin  (CRESTOR ) 40 MG tablet Take 1 tablet by mouth once daily 90 tablet 0   XIIDRA 5 % SOLN Apply 1 drop to eye 2 (two) times daily.     No current facility-administered medications on file prior to visit.   Allergies  Allergen Reactions   Ace Inhibitors Cough   Celebrex [Celecoxib] Other (See Comments)    makes me bleed   Diovan  [Valsartan ]     angioedema   Family History  Problem Relation Age of Onset   Hypertension Mother    Rheum arthritis Mother    Hypertension Father  Diabetes Father    Prostate cancer Father    Kidney disease Father    Diabetes Sister    Fibromyalgia Sister    Diabetes Maternal Grandmother    Lung cancer Maternal Grandfather    Arthritis Brother    Migraines Brother    CAD Brother    Fibromyalgia Sister    Migraines Sister    Colon cancer Neg Hx    Thyroid  disease Neg Hx    Colon polyps Neg Hx    PE: BP 120/70   Pulse 68   Ht 5' 4 (1.626 m)   Wt 231 lb 12.8 oz (105.1 kg)    SpO2 95%   BMI 39.79 kg/m  Wt Readings from Last 3 Encounters:  08/02/23 231 lb 12.8 oz (105.1 kg)  07/18/23 232 lb (105.2 kg)  06/27/23 228 lb 3.2 oz (103.5 kg)   Constitutional: overweight, in NAD Eyes: EOMI, no exophthalmos, no lid lag, no stare ENT: no thyromegaly, no cervical lymphadenopathy Cardiovascular: RRR, No MRG Respiratory: CTA B Musculoskeletal: no deformities Skin: + Papular rash on hands, legs, ears, hairline (from birth) Neurological: no tremor with outstretched hands  ASSESSMENT: 1. Thyrotoxicosis  PLAN:  1. Patient with a history of a low TSH, without thyrotoxic symptoms: No weight loss, heat intolerance, hyperdefecation, palpitations, anxiety.  I first saw her a year ago, previously saw Dr. Kassie.  At last visit she was on methimazole  5 mg daily and her TFTs were all normal.  TSI antibodies were undetectable.  Since last visit she had 2 subsequent sets of  TFT checked in 04 in 02/2023 and these were all normal. -We discussed about possible etiology for her thyrotoxicosis with the most likely diagnosis being mild Graves' disease.  Other possibilities like thyroiditis and toxic multinodular goiter or toxic adenoma were also discussed but I feel that these are less likely for her. -At today's visit, she is feeling well, without thyrotoxic signs or symptoms.  She has otalgia for which she was on prednisone  taper for 5 days but finished 9 days ago.  We did discuss that if the TSH is low, we may need to repeat this. -Continues methimazole  5 mg daily, well-tolerated.  I am hoping that we can reduce the methimazole  dose and we discussed that we could have her take it every other day.  However, we will decide after the results are back. -We will recheck her TSH, free T4, free T3 and change the methimazole  dose accordingly -She is on metoprolol .  Heart rate is normal at today's visit -No signs of active Graves' ophthalmopathy: No double vision, blurry vision, eye pain,  chemosis -I plan to see her back in a year, but likely sooner for labs  Needs MMI refills.  Orders Placed This Encounter  Procedures   TSH   T4, free   T3, free   Component     Latest Ref Rng 08/02/2023  T4,Free(Direct)     0.8 - 1.8 ng/dL 1.2   TSH     9.59 - 5.49 mIU/L 2.38   Triiodothyronine,Free,Serum     2.3 - 4.2 pg/mL 3.4   Normal TFTs.  I will suggest to decrease the dose of methimazole  to 5 mg every other day and repeat her testing 1.5 months.  Lela Fendt, MD PhD [facility]

## 2023-08-02 NOTE — Addendum Note (Signed)
 Addended by: Emilie Harden on: 08/02/2023 03:41 PM   Modules accepted: Orders

## 2023-08-03 ENCOUNTER — Emergency Department (HOSPITAL_BASED_OUTPATIENT_CLINIC_OR_DEPARTMENT_OTHER)
Admission: EM | Admit: 2023-08-03 | Discharge: 2023-08-03 | Disposition: A | Discharge status: Home / Self Care | Payer: 59 | Attending: Emergency Medicine | Admitting: Emergency Medicine

## 2023-08-03 ENCOUNTER — Encounter (HOSPITAL_BASED_OUTPATIENT_CLINIC_OR_DEPARTMENT_OTHER): Payer: Self-pay | Admitting: *Deleted

## 2023-08-03 ENCOUNTER — Other Ambulatory Visit: Payer: Self-pay

## 2023-08-03 ENCOUNTER — Emergency Department (HOSPITAL_BASED_OUTPATIENT_CLINIC_OR_DEPARTMENT_OTHER): Payer: 59

## 2023-08-03 DIAGNOSIS — Z79899 Other long term (current) drug therapy: Secondary | ICD-10-CM | POA: Diagnosis not present

## 2023-08-03 DIAGNOSIS — I6523 Occlusion and stenosis of bilateral carotid arteries: Secondary | ICD-10-CM | POA: Diagnosis not present

## 2023-08-03 DIAGNOSIS — H9203 Otalgia, bilateral: Secondary | ICD-10-CM | POA: Diagnosis not present

## 2023-08-03 DIAGNOSIS — Z7982 Long term (current) use of aspirin: Secondary | ICD-10-CM | POA: Insufficient documentation

## 2023-08-03 DIAGNOSIS — I700 Atherosclerosis of aorta: Secondary | ICD-10-CM | POA: Diagnosis not present

## 2023-08-03 DIAGNOSIS — R42 Dizziness and giddiness: Secondary | ICD-10-CM | POA: Insufficient documentation

## 2023-08-03 DIAGNOSIS — E119 Type 2 diabetes mellitus without complications: Secondary | ICD-10-CM | POA: Diagnosis not present

## 2023-08-03 DIAGNOSIS — I10 Essential (primary) hypertension: Secondary | ICD-10-CM | POA: Insufficient documentation

## 2023-08-03 DIAGNOSIS — E041 Nontoxic single thyroid nodule: Secondary | ICD-10-CM | POA: Diagnosis not present

## 2023-08-03 LAB — BASIC METABOLIC PANEL
Anion gap: 9 (ref 5–15)
BUN: 18 mg/dL (ref 8–23)
CO2: 26 mmol/L (ref 22–32)
Calcium: 8.7 mg/dL — ABNORMAL LOW (ref 8.9–10.3)
Chloride: 101 mmol/L (ref 98–111)
Creatinine, Ser: 1.24 mg/dL — ABNORMAL HIGH (ref 0.44–1.00)
GFR, Estimated: 49 mL/min — ABNORMAL LOW (ref >60)
Glucose, Bld: 116 mg/dL — ABNORMAL HIGH (ref 70–99)
Potassium: 4.3 mmol/L (ref 3.5–5.1)
Sodium: 136 mmol/L (ref 135–145)

## 2023-08-03 LAB — CBC WITH DIFFERENTIAL/PLATELET
Abs Immature Granulocytes: 0.03 10*3/uL (ref 0.00–0.07)
Basophils Absolute: 0 10*3/uL (ref 0.0–0.1)
Basophils Relative: 0 %
Eosinophils Absolute: 0.1 10*3/uL (ref 0.0–0.5)
Eosinophils Relative: 1 %
HCT: 39.5 % (ref 36.0–46.0)
Hemoglobin: 11.8 g/dL — ABNORMAL LOW (ref 12.0–15.0)
Immature Granulocytes: 0 %
Lymphocytes Relative: 38 %
Lymphs Abs: 3.4 10*3/uL (ref 0.7–4.0)
MCH: 21.8 pg — ABNORMAL LOW (ref 26.0–34.0)
MCHC: 29.9 g/dL — ABNORMAL LOW (ref 30.0–36.0)
MCV: 73 fL — ABNORMAL LOW (ref 80.0–100.0)
Monocytes Absolute: 0.7 10*3/uL (ref 0.1–1.0)
Monocytes Relative: 8 %
Neutro Abs: 4.7 10*3/uL (ref 1.7–7.7)
Neutrophils Relative %: 53 %
Platelets: 256 10*3/uL (ref 150–400)
RBC: 5.41 MIL/uL — ABNORMAL HIGH (ref 3.87–5.11)
RDW: 15.3 % (ref 11.5–15.5)
WBC: 8.9 10*3/uL (ref 4.0–10.5)
nRBC: 0 % (ref 0.0–0.2)

## 2023-08-03 MED ORDER — MECLIZINE HCL 12.5 MG PO TABS: 12.5000 mg | ORAL_TABLET | Freq: Three times a day (TID) | ORAL | 0 refills | Status: DC | PRN

## 2023-08-03 MED ORDER — ACETAMINOPHEN 500 MG PO TABS
1000.0000 mg | ORAL_TABLET | Freq: Once | ORAL | Status: AC
  Administered 2023-08-03: 1000 mg via ORAL
  Filled 2023-08-03: qty 2

## 2023-08-03 MED ORDER — IOHEXOL 350 MG/ML SOLN
75.0000 mL | Freq: Once | INTRAVENOUS | Status: AC | PRN
  Administered 2023-08-03: 75 mL via INTRAVENOUS

## 2023-08-03 MED ORDER — METOCLOPRAMIDE HCL 5 MG/ML IJ SOLN
10.0000 mg | Freq: Once | INTRAMUSCULAR | Status: AC
  Administered 2023-08-03: 10 mg via INTRAVENOUS
  Filled 2023-08-03: qty 2

## 2023-08-03 MED ORDER — MECLIZINE HCL 25 MG PO TABS
25.0000 mg | ORAL_TABLET | Freq: Once | ORAL | Status: AC
  Administered 2023-08-03: 25 mg via ORAL
  Filled 2023-08-03: qty 1

## 2023-08-03 MED ORDER — SODIUM CHLORIDE 0.9 % IV BOLUS
1000.0000 mL | Freq: Once | INTRAVENOUS | Status: AC
  Administered 2023-08-03: 1000 mL via INTRAVENOUS

## 2023-08-03 NOTE — Discharge Instructions (Addendum)
 Your lab work showed some mild dehydration.  Your CT scan showed some atherosclerosis of the vessels in your brain, as well as an abnormal thyroid  nodule for which you need to follow-up with your doctor to get an ultrasound of your thyroid .  Your symptoms are more likely due to a potential ear problem.  I recommend he follow closely with your primary care doctor.  You can take the prescribed medication, however be careful as it may make you drowsy.  If you develop persistent severe dizziness, difficulty speaking, weakness, numbness, severe headache or vision changes or any other strokelike symptoms you should return to the ED.

## 2023-08-03 NOTE — ED Provider Notes (Signed)
 [facility] Provider Note   CSN: [id number] Arrival date & time: 08/03/23  1739     History  Chief Complaint  Patient presents with   Otalgia   Dizziness    [name] is a 63 y.o. female.   Otalgia Dizziness 63 year old female history of GERD, hypertension, hyperlipidemia, migraines, type 2 diabetes presenting for dizziness.  She states recently she is been treated for bilateral ear infection.  She received a CMN was noted to have fluid in her ear still.  She still has some mild bilateral ear pain.  She woke up this morning with mild headache as well as dizziness which she describes as feeling as she got a pass out and a spinning sensation.  It is mild at rest but particular worse with head movement.  No vision changes or weakness or numbness.     Home Medications Prior to Admission medications   Medication Sig Start Date End Date Taking? Authorizing Provider  albuterol  (VENTOLIN  HFA) 108 (90 Base) MCG/ACT inhaler INHALE 2 PUFFS BY MOUTH 4 TIMES DAILY 08/18/21   O'Sullivan, Melissa, NP  alclomethasone (ACLOVATE ) 0.05 % cream Apply topically 2 (two) times daily as needed (Rash). 01/26/21   Sheffield, Kelli R, PA-C  amLODipine  (NORVASC ) 10 MG tablet Take 1 tablet by mouth once daily 07/21/23   O'Sullivan, Melissa, NP  aspirin  81 MG chewable tablet Chew 1 tablet (81 mg total) by mouth daily. 07/19/17   Jadine Toribio SQUIBB, MD  clobetasol  (TEMOVATE ) 0.05 % external solution Apply 1 application topically 2 (two) times daily. 01/26/21   Sheffield, Kelli R, PA-C  clotrimazole -betamethasone  (LOTRISONE ) cream Apply 1 application topically 2 (two) times daily. 05/01/21   Frann Mabel Mt, DO  DULoxetine  (CYMBALTA ) 30 MG capsule Take 1 capsule (30 mg total) by mouth daily. 04/26/23   Jeannetta Lonni ORN, MD  Evolocumab  (REPATHA  SURECLICK) 140 MG/ML SOAJ INJECT 140 MG SUBCUTANEOUSLY EVERY 14 DAYS 07/31/23   Okey Vina GAILS, MD  fluconazole   (DIFLUCAN ) 150 MG tablet Take 1 tablet by mouth now- may repeat in 3 days if needed 07/30/23   O'Sullivan, Melissa, NP  fluticasone  (FLONASE ) 50 MCG/ACT nasal spray Place 2 sprays into both nostrils daily. 01/17/21   Saguier, Dallas, PA-C  furosemide  (LASIX ) 20 MG tablet Take 1 tablet (20 mg total) by mouth daily as needed (for shortness of breath or weight gain of 2 lbs overnight or 5 lbs in a week). 08/22/22   Conte, Tessa N, PA-C  hyoscyamine  (LEVSIN ) 0.125 MG tablet Take 1 tablet (0.125 mg total) by mouth every 4 (four) hours as needed for cramping (diarrhea,nausea). 10/13/21   Armbruster, Elspeth SQUIBB, MD  isosorbide  mononitrate (IMDUR ) 60 MG 24 hr tablet TAKE 1 & 1/2 (ONE & ONE-HALF) TABLETS BY MOUTH ONCE DAILY Patient not taking: Reported on 08/02/2023 12/10/22   Okey Vina GAILS, MD  loratadine  (CLARITIN ) 10 MG tablet Take 1 tablet (10 mg total) by mouth daily. 07/15/20   Geiple, Joshua, PA-C  methimazole  (TAPAZOLE ) 5 MG tablet Take 1 tablet by mouth every other day 08/02/23   Trixie File, MD  methocarbamol  (ROBAXIN ) 500 MG tablet Take 1 tablet (500 mg total) by mouth every 8 (eight) hours as needed for muscle spasms. 09/15/21   O'Sullivan, Melissa, NP  metoCLOPramide  (REGLAN ) 5 MG tablet Take 1 tablet (5 mg total) by mouth 3 (three) times daily before meals. Schedule with GI for further refills 06/21/23   Daryl Setter, NP  ondansetron  (ZOFRAN ) 4 MG  tablet Take 1 tab by mouth every 6 hours as needed for nausea. 06/10/20   O'Sullivan, Melissa, NP  ondansetron  (ZOFRAN -ODT) 8 MG disintegrating tablet Take 8 mg by mouth every 8 (eight) hours as needed for vomiting or nausea. 07/18/11   [provider]  pantoprazole  (PROTONIX ) 40 MG tablet Take 1 tablet (40 mg total) by mouth daily. 06/27/23   Zehr, Jessica D, PA-C  potassium chloride  SA (KLOR-CON  M20) 20 MEQ tablet Take 1 tablet by mouth once daily 10/05/22   Ross, Paula V, MD  rosuvastatin  (CRESTOR ) 40 MG tablet Take 1 tablet by mouth once daily  04/14/23   O'Sullivan, Melissa, NP  XIIDRA 5 % SOLN Apply 1 drop to eye 2 (two) times daily. 06/12/22   [provider]      Allergies    Ace inhibitors, Celebrex [celecoxib], and Diovan  [valsartan ]    Review of Systems   Review of Systems  HENT:  Positive for ear pain.   Neurological:  Positive for dizziness.  Review of systems completed and notable as per HPI.  ROS otherwise negative.   Physical Exam Updated Vital Signs BP 119/74   Pulse (!) 55   Temp 98.2 F (36.8 C) (Oral)   Resp 18   SpO2 99%  Physical Exam Vitals and nursing note reviewed.  Constitutional:      General: She is not in acute distress.    Appearance: She is well-developed.  HENT:     Head: Normocephalic and atraumatic.     Ears:     Comments: Mild ear effusions bilaterally.  No signs of infection.    Nose: Nose normal.     Mouth/Throat:     Mouth: Mucous membranes are moist.     Pharynx: Oropharynx is clear.  Eyes:     Extraocular Movements: Extraocular movements intact.     Conjunctiva/sclera: Conjunctivae normal.     Pupils: Pupils are equal, round, and reactive to light.  Cardiovascular:     Rate and Rhythm: Normal rate and regular rhythm.     Pulses: Normal pulses.     Heart sounds: Normal heart sounds. No murmur heard. Pulmonary:     Effort: Pulmonary effort is normal. No respiratory distress.     Breath sounds: Normal breath sounds.  Abdominal:     Palpations: Abdomen is soft.     Tenderness: There is no abdominal tenderness.  Musculoskeletal:        General: No swelling.     Cervical back: Normal range of motion and neck supple. No rigidity or tenderness.     Right lower leg: No edema.     Left lower leg: No edema.  Skin:    General: Skin is warm and dry.     Capillary Refill: Capillary refill takes less than 2 seconds.  Neurological:     General: No focal deficit present.     Mental Status: She is alert and oriented to person, place, and time. Mental status is at  baseline.     Cranial Nerves: No cranial nerve deficit.     Sensory: No sensory deficit.     Motor: No weakness.     Coordination: Coordination normal.     Gait: Gait normal.     Deep Tendon Reflexes: Reflexes normal.     Comments: Positive Dix-Hallpike bilaterally  Psychiatric:        Mood and Affect: Mood normal.     ED Results / Procedures / Treatments   Labs (all labs ordered are  listed, but only abnormal results are displayed) Labs Reviewed  BASIC METABOLIC PANEL - Abnormal; Notable for the following components:      Result Value   Glucose, Bld 116 (*)    Creatinine, Ser 1.24 (*)    Calcium  8.7 (*)    GFR, Estimated 49 (*)    All other components within normal limits  CBC WITH DIFFERENTIAL/PLATELET - Abnormal; Notable for the following components:   RBC 5.41 (*)    Hemoglobin 11.8 (*)    MCV 73.0 (*)    MCH 21.8 (*)    MCHC 29.9 (*)    All other components within normal limits    EKG EKG Interpretation Date/Time:  Saturday August 03 2023 19:34:21 EST Ventricular Rate:  55 PR Interval:  173 QRS Duration:  104 QT Interval:  504 QTC Calculation: 483 R Axis:   0  Text Interpretation: Sinus rhythm Low voltage, precordial leads RSR' in V1 or V2, right VCD or RVH Nonspecific T abnrm, anterolateral leads Confirmed by Nicholaus Dolphin [phone]) on 08/03/2023 7:50:16 PM  Radiology CT Angio Head Neck W WO CM Result Date: 08/03/2023 CLINICAL DATA:  Initial evaluation for vertigo. EXAM: CT ANGIOGRAPHY HEAD AND NECK WITH AND WITHOUT CONTRAST TECHNIQUE: Multidetector CT imaging of the head and neck was performed using the standard protocol during bolus administration of intravenous contrast. Multiplanar CT image reconstructions and MIPs were obtained to evaluate the vascular anatomy. Carotid stenosis measurements (when applicable) are obtained utilizing NASCET criteria, using the distal internal carotid diameter as the denominator. RADIATION DOSE REDUCTION: This exam was performed  according to the departmental dose-optimization program which includes automated exposure control, adjustment of the mA and/or kV according to patient size and/or use of iterative reconstruction technique. CONTRAST:  75mL OMNIPAQUE  IOHEXOL  350 MG/ML SOLN COMPARISON:  None Available. FINDINGS: CT HEAD FINDINGS Brain: Cerebral volume within normal limits for patient age. No acute intracranial hemorrhage. No acute [name] vessel territory infarct. No mass lesion, midline shift, or mass effect. Ventricles are normal in size without hydrocephalus. No extra-axial fluid collection. Vascular: No abnormal hyperdense vessel. Skull: Scalp soft tissues demonstrate no acute abnormality. Calvarium intact. Sinuses/Orbits: Globes and orbital soft tissues within normal limits. Visualized paranasal sinuses are largely clear. Prior right mastoidectomy. Left mastoid air cells are clear. CTA NECK FINDINGS Aortic arch: Standard branching. Imaged portion shows no evidence of aneurysm or dissection. No significant stenosis of the major arch vessel origins. Mild aortic atherosclerosis. Right carotid system: No evidence of dissection. Mild atheromatous change about the right carotid bulb without hemodynamically significant stenosis. Left carotid system: No evidence of dissection. Mild atheromatous change about the left carotid bulb without hemodynamically significant stenosis. Vertebral arteries: No evidence of dissection, stenosis (50% or greater), or occlusion. Skeleton: No discrete or worrisome osseous lesions. Other neck: No other acute finding. 1.6 cm left thyroid  nodule noted. Upper chest: No other acute finding. Review of the MIP images confirms the above findings CTA HEAD FINDINGS Anterior circulation: Atheromatous change about the carotid siphons bilaterally with associated moderate to severe stenoses bilaterally. A1 segments patent bilaterally. Normal anterior communicating artery complex. Anterior cerebral arteries patent without  stenosis. No M1 stenosis or occlusion. Distal MCA branches perfused and symmetric. Posterior circulation: Both V4 segments patent without significant stenosis. Both PICA patent at their origins. Basilar patent without stenosis. Superior cerebellar and posterior cerebral arteries patent bilaterally. Venous sinuses: Patent allowing for timing the contrast bolus. Anatomic variants: None significant.  No aneurysm. Review of the MIP images confirms the above  findings IMPRESSION: CT HEAD: 1. No acute intracranial abnormality. 2. Prior right mastoidectomy. CTA HEAD AND NECK: 1. Negative CTA for [name] vessel occlusion or other emergent finding. 2. Atheromatous change about the carotid siphons with associated moderate to severe stenoses bilaterally. 3. Mild atheromatous change elsewhere. No other hemodynamically significant stenosis about the major arterial vasculature of the head and neck. 4.  Aortic Atherosclerosis (ICD10-I70.0). 5. 1.6 cm left thyroid  nodule. Further evaluation with dedicated thyroid  ultrasound recommended. (Ref: J Am Coll Radiol. 2015 Feb;12(2): 143-50). Electronically Signed   By: Morene Hoard M.D.   On: 08/03/2023 22:09    Procedures Procedures    Medications Ordered in ED Medications  acetaminophen  (TYLENOL ) tablet 1,000 mg (1,000 mg Oral Given 08/03/23 1913)  meclizine  (ANTIVERT ) tablet 25 mg (25 mg Oral Given 08/03/23 1913)  metoCLOPramide  (REGLAN ) injection 10 mg (10 mg Intravenous Given 08/03/23 2018)  sodium chloride  0.9 % bolus 1,000 mL (1,000 mLs Intravenous New Bag/Given 08/03/23 2027)  iohexol  (OMNIPAQUE ) 350 MG/ML injection 75 mL (75 mLs Intravenous Contrast Given 08/03/23 2116)    ED Course/ Medical Decision Making/ A&P Clinical Course as of 08/03/23 2252  Sat Aug 03, 2023  2232 Discussed with Dr. Deedra with neurology, no indication for starting aspirin . [JD]    Clinical Course User Index [JD] Nicholaus Cassondra DEL, MD                                 Medical Decision  Making Amount and/or Complexity of Data Reviewed Labs: ordered. Radiology: ordered.  Risk OTC drugs. Prescription drug management.   Medical Decision Making:   [name] is a 63 y.o. female who presented to the ED today with dizziness and vertigo.  Vital signs reviewed.  Exam she is well-appearing.  She is some very mild dizziness at rest as well as headache which all started this morning.  She is no deficits on exam I have low concern for stroke at this time she does not meet code stroke criteria.  I suspect she may have peripheral vertigo especially positive Dix-Hallpike and symptoms worsen with head movement given symptoms present at rest obtain CTA to rule out dissection or other vascular abnormality especially given her risk factors and treat her symptomatically.   Patient placed on continuous vitals and telemetry monitoring while in ED which was reviewed periodically.  Reviewed and confirmed nursing documentation for past medical history, family history, social history.  Reassessment and Plan:   CTA reviewed.  Notable for atheromatous changes causing moderate to severe stenosis of the bilateral carotid siphons.  I spoke with Dr. Deedra with neurology who stated no indication for starting aspirin  at this time, likely not causing her symptoms.  On reassessment she is feeling well.  She has no days at rest, only very mild dizziness with moving her head.  I think this is likely more peripheral.  I have lower suspicion for acute stroke I do not think she needs an emergent MRI at this time.  I did discuss with her though that if she has any worsening dizziness, persistent symptoms or any new strokelike symptoms she would need to return for MRI.  I recommend she follow close with her PCP.  I notified her of the finding of the thyroid  nodule on her CT as well for which he needs an ultrasound.  Labs otherwise notable for slightly elevated creatinine compared to prior, she is tolerant p.o. and  received  some IV fluids.  I do not like she needs admission for AKI.  Anemia is stable.  Patient is comfortable with this plan.   Patient's presentation is most consistent with acute complicated illness / injury requiring diagnostic workup.           Final Clinical Impression(s) / ED Diagnoses Final diagnoses:  Dizziness    Rx / DC Orders ED Discharge Orders     None         Nicholaus Cassondra DEL, MD 08/03/23 2252

## 2023-08-03 NOTE — ED Triage Notes (Signed)
 Pt was being treated for bilateral ear infection by her PCP and she states that she still has fluid behind both ear drums.  She states that when she woke up this am she had dizziness when getting up and changing positions and when ambulating.  No focal weakness or numbness with this.  Pt is alert and oriented.  Pt went to bed around 11pm last night and did not yet have the dizziness at that time.

## 2023-08-05 ENCOUNTER — Telehealth: Payer: Self-pay

## 2023-08-05 NOTE — Telephone Encounter (Signed)
 Pt seen at [facility].

## 2023-08-05 NOTE — Telephone Encounter (Signed)
 Initial Comment Caller states she had a ear infection. Fluid in her ears. Dizzy. Translation No Nurse Assessment Nurse: Mochetta, RN, Loris Ros Date/Time (Eastern Time): 08/03/2023 4:53:02 PM Confirm and document reason for call. If symptomatic, describe symptoms. ---Caller states she went to the Dr Friday after being on antibiotics and steroids all last week. She was told Friday she still had fluid in her ears. Woke up this morning she felt dizzy/lightheaded. Still feels slightly dizzy. Reports mild pain to both ears. Denies any numbness/tingling or any other neuro deficits. Does the patient have any new or worsening symptoms? ---Yes Will a triage be completed? ---Yes Related visit to physician within the last 2 weeks? ---Yes Does the PT have any chronic conditions? (i.e. diabetes, asthma, this includes High risk factors for pregnancy, etc.) ---Yes List chronic conditions. ---HTN thyroid  Is this a behavioral health or substance abuse call? ---No Guidelines Guideline Title Affirmed Question Affirmed Notes Nurse Date/Time (Eastern Time) Ear - Otitis Media Follow-up Call Walking is very unsteady or feels very dizzy Adela Ades, RN, Loris Ros 08/03/2023 4:55:45 PM PLEASE NOTE: All timestamps contained within this report are represented as [place] Standard Time. CONFIDENTIALTY NOTICE: This fax transmission is intended only for the addressee. It contains information that is legally privileged, confidential or otherwise protected from use or disclosure. If you are not the intended recipient, you are strictly prohibited from reviewing, disclosing, copying using or disseminating any of this information or taking any action in reliance on or regarding this information. If you have received this fax in error, please notify us  immediately by telephone so that we can arrange for its return to us . Phone: [phone], Toll-Free: [phone], Fax: [phone] Page: 2 of 2 Call Id: [id number] Disp.  Time [facility] Time) Disposition Final User 08/03/2023 4:59:34 PM See HCP within 4 Hours (or PCP triage) Yes Adela Ades, RN, Loris Ros Final Disposition 08/03/2023 4:59:34 PM See HCP within 4 Hours (or PCP triage) Yes Mochetta, RN, Babara Lerner Disagree/Comply Comply Caller Understands Yes PreDisposition Home Care Care Advice Given Per Guideline * IF OFFICE WILL BE CLOSED AND NO PCP (PRIMARY CARE PROVIDER) SECOND-LEVEL TRIAGE: You need to be seen within the next 3 or 4 hours. A nearby Urgent Care Center [facility]) is often a good source of care. Another choice is to go to the ED. Go sooner if you become worse. * It is better and safer if another adult drives instead of you. * You become worse CALL BACK IF: ANOTHER ADULT SHOULD DRIVE: CARE ADVICE given per Ear - Otitis Media Follow-up Call (Adult) guideline. Referrals [facility] at [facility]

## 2023-08-06 ENCOUNTER — Emergency Department (HOSPITAL_BASED_OUTPATIENT_CLINIC_OR_DEPARTMENT_OTHER): Payer: 59

## 2023-08-06 ENCOUNTER — Encounter (HOSPITAL_BASED_OUTPATIENT_CLINIC_OR_DEPARTMENT_OTHER): Payer: Self-pay

## 2023-08-06 ENCOUNTER — Emergency Department (HOSPITAL_BASED_OUTPATIENT_CLINIC_OR_DEPARTMENT_OTHER)
Admission: EM | Admit: 2023-08-06 | Discharge: 2023-08-06 | Disposition: A | Discharge status: Home / Self Care | Payer: 59 | Attending: Emergency Medicine | Admitting: Emergency Medicine

## 2023-08-06 ENCOUNTER — Ambulatory Visit: Payer: 59 | Admitting: Family

## 2023-08-06 VITALS — BP 134/68 | HR 64 | Temp 99.5°F | Resp 16 | Ht 64.0 in | Wt 233.0 lb

## 2023-08-06 DIAGNOSIS — R42 Dizziness and giddiness: Secondary | ICD-10-CM | POA: Insufficient documentation

## 2023-08-06 DIAGNOSIS — R0989 Other specified symptoms and signs involving the circulatory and respiratory systems: Secondary | ICD-10-CM | POA: Diagnosis not present

## 2023-08-06 DIAGNOSIS — M51369 Other intervertebral disc degeneration, lumbar region without mention of lumbar back pain or lower extremity pain: Secondary | ICD-10-CM | POA: Diagnosis not present

## 2023-08-06 DIAGNOSIS — R079 Chest pain, unspecified: Secondary | ICD-10-CM

## 2023-08-06 DIAGNOSIS — R0789 Other chest pain: Secondary | ICD-10-CM | POA: Diagnosis not present

## 2023-08-06 DIAGNOSIS — Z79899 Other long term (current) drug therapy: Secondary | ICD-10-CM | POA: Insufficient documentation

## 2023-08-06 DIAGNOSIS — I517 Cardiomegaly: Secondary | ICD-10-CM | POA: Diagnosis not present

## 2023-08-06 LAB — BASIC METABOLIC PANEL
Anion gap: 9 (ref 5–15)
BUN: 11 mg/dL (ref 8–23)
CO2: 23 mmol/L (ref 22–32)
Calcium: 8.9 mg/dL (ref 8.9–10.3)
Chloride: 103 mmol/L (ref 98–111)
Creatinine, Ser: 0.98 mg/dL (ref 0.44–1.00)
GFR, Estimated: >60 mL/min (ref >60)
Glucose, Bld: 127 mg/dL — ABNORMAL HIGH (ref 70–99)
Potassium: 4.1 mmol/L (ref 3.5–5.1)
Sodium: 135 mmol/L (ref 135–145)

## 2023-08-06 LAB — CBC
HCT: 38.8 % (ref 36.0–46.0)
Hemoglobin: 11.8 g/dL — ABNORMAL LOW (ref 12.0–15.0)
MCH: 22.4 pg — ABNORMAL LOW (ref 26.0–34.0)
MCHC: 30.4 g/dL (ref 30.0–36.0)
MCV: 73.6 fL — ABNORMAL LOW (ref 80.0–100.0)
Platelets: 235 10*3/uL (ref 150–400)
RBC: 5.27 MIL/uL — ABNORMAL HIGH (ref 3.87–5.11)
RDW: 14.9 % (ref 11.5–15.5)
WBC: 6.8 10*3/uL (ref 4.0–10.5)
nRBC: 0 % (ref 0.0–0.2)

## 2023-08-06 LAB — TROPONIN I (HIGH SENSITIVITY)
Troponin I (High Sensitivity): 3 ng/L (ref <18)
Troponin I (High Sensitivity): 3 ng/L (ref <18)

## 2023-08-06 MED ORDER — DICLOFENAC SODIUM 1 % EX GEL: 4.0000 g | Freq: Four times a day (QID) | CUTANEOUS | 0 refills | Status: DC

## 2023-08-06 MED ORDER — ONDANSETRON HCL 4 MG/2ML IJ SOLN
4.0000 mg | Freq: Once | INTRAMUSCULAR | Status: AC
Start: 2023-08-06 — End: 2023-08-06
  Administered 2023-08-06: 4 mg via INTRAVENOUS
  Filled 2023-08-06: qty 2

## 2023-08-06 MED ORDER — ONDANSETRON HCL 4 MG/2ML IJ SOLN
4.0000 mg | Freq: Once | INTRAMUSCULAR | Status: AC
  Administered 2023-08-06: 4 mg via INTRAVENOUS
  Filled 2023-08-06: qty 2

## 2023-08-06 MED ORDER — MORPHINE SULFATE (PF) 2 MG/ML IV SOLN
2.0000 mg | Freq: Once | INTRAVENOUS | Status: AC
  Administered 2023-08-06: 2 mg via INTRAVENOUS
  Filled 2023-08-06: qty 1

## 2023-08-06 NOTE — Discharge Instructions (Addendum)
Your test here look good.  Please follow-up with a cardiologist in the office.  Use the gel as prescribed. Also take tylenol 1000mg (2 extra strength) four times a day.

## 2023-08-06 NOTE — Patient Instructions (Signed)
   You will be transferred to the emergency department for cardiac enzyme testing to rule out a heart attack. Continue your current treatment for dizziness. If your left leg pain persists, further evaluation will be considered.

## 2023-08-06 NOTE — Progress Notes (Signed)
Subjective:     Patient ID: [name], female    DOB: [birth year], 63 y.o.   MRN: [id number]  Chief Complaint  Patient presents with   Hospitalization Follow-up    For dizziness    Chest Pain    Patient reports having chest pain today    Chest Pain     Discussed the use of AI scribe software for clinical note transcription with the patient, who gave verbal consent to proceed.  History of Present Illness   [name] is a 63 year old female with hypertension and type two diabetes who presents for hospital follow-up after experiencing dizziness. She is accompanied by her daughter.  She has been experiencing dizziness since the morning of August 03, 2023. Initially, she tried to rest, but the dizziness persisted throughout the day. Her daughter brought her to the emergency department later that afternoon. In the ED, a positive Dix-Hallpike maneuver was noted, and a CT angiogram of the brain revealed moderate to severe stenosis of the bilateral carotid siphons. Despite treatment for dizziness, she continues to experience symptoms.  On the morning of August 06, 2023, she experienced severe chest pain, which was partially relieved by nitroglycerin. The pain radiates down her left arm. She describes sharp, shooting pain in her left leg, which she attributes to nerve pain possibly related to her lower back issues.  She notes difficulty swallowing, describing a sensation of food getting stuck in her esophagus when drinking.          Health Maintenance Due  Topic Date Due   OPHTHALMOLOGY EXAM  Never done   FOOT EXAM  05/30/2013   DTaP/Tdap/Td (3 - Td or Tdap) 04/29/2021   HEMOGLOBIN A1C  08/06/2022   MAMMOGRAM  09/29/2022   Diabetic kidney evaluation - Urine ACR  12/02/2022   INFLUENZA VACCINE  01/24/2023   COVID-19 Vaccine (5 - 2024-25 season) 02/24/2023    Past Medical History:  Diagnosis Date   Allergy    allergic rhinitis   Arthritis    Atypical chest  pain    Constipation    Cyst, ovarian    Diabetes mellitus, type II (HCC)    Elevated alkaline phosphatase level    Endometriosis    Esophageal stricture    Fatty liver    Gastroparesis    GERD (gastroesophageal reflux disease)    Heart murmur    Hepatic hemangioma    Hyperlipidemia    Hypertension    Hypertrophic condition of skin    acrokeratoelastoidosis- s/p 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 1/08- benign   Iron deficiency anemia    Migraine headache    Non-compliance    Peptic ulcer disease    Thyroid disease     Past Surgical History:  Procedure Laterality Date   ABDOMINAL EXPLORATION SURGERY     w/bso    BREAST BIOPSY     CARDIAC CATHETERIZATION  2009   mild non obstructive CAD   CHOLECYSTECTOMY     COLONOSCOPY     ESOPHAGEAL MANOMETRY N/A 08/01/2020   Procedure: ESOPHAGEAL MANOMETRY (EM);  Surgeon: Benancio Deeds, MD;  Location: [facility];  Service: Gastroenterology;  Laterality: N/A;   KNEE SURGERY  2005    left knee   LEFT HEART CATH AND CORONARY ANGIOGRAPHY N/A 07/17/2017   Procedure: LEFT HEART CATH AND CORONARY ANGIOGRAPHY;  Surgeon: Swaziland, Peter M, MD;  Location: [facility] INVASIVE CV LAB;  Service: Cardiovascular;  Laterality: N/A;   LEFT HEART CATH AND CORONARY ANGIOGRAPHY N/A 09/01/2021  Procedure: LEFT HEART CATH AND CORONARY ANGIOGRAPHY;  Surgeon: Kathleene Hazel, MD;  Location: [facility] INVASIVE CV LAB;  Service: Cardiovascular;  Laterality: N/A;   POLYPECTOMY     TOTAL ABDOMINAL HYSTERECTOMY     UPPER GASTROINTESTINAL ENDOSCOPY      Family History  Problem Relation Age of Onset   Hypertension Mother    Rheum arthritis Mother    Hypertension Father    Diabetes Father    Prostate cancer Father    Kidney disease Father    Diabetes Sister    Fibromyalgia Sister    Diabetes Maternal Grandmother    Lung cancer Maternal Grandfather    Arthritis Brother    Migraines Brother    CAD Brother    Fibromyalgia Sister    Migraines Sister    Colon cancer Neg Hx     Thyroid disease Neg Hx    Colon polyps Neg Hx     Social History   Socioeconomic History   Marital status: Widowed    Spouse name: Not on file   Number of children: 2   Years of education: Not on file   Highest education level: Not on file  Occupational History   Occupation: DISABILITY    Employer: UNEMPLOYED  Tobacco Use   Smoking status: Former    Current packs/day: 0.00    Average packs/day: 0.5 packs/day for 18.0 years (9.0 ttl pk-yrs)    Types: Cigarettes    Start date: 07/29/1977    Quit date: 06/25/1994    Years since quitting: 29.1   Smokeless tobacco: Never   Tobacco comments:    quit 19 years ago  Vaping Use   Vaping status: Never Used  Substance and Sexual Activity   Alcohol use: Yes    Alcohol/week: 0.0 standard drinks of alcohol    Comment: social drinker   Drug use: No   Sexual activity: Not Currently  Other Topics Concern   Not on file  Social History Narrative   Widowed   Has 2 grown children.  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    Former Smoker Quit tobacco in 1996.  She was a pack a day smoker for approximately 10 years.    Alcohol use-yes: Social    Daily Caffeine Use:6 pack of pepsi daily     Illicit Drug Use - no    Patient does not get regular exercise.       Smoking Status:  quit   Social Drivers of Health   Financial Resource Strain: Low Risk  (01/16/2023)   Overall Financial Resource Strain (CARDIA)    Difficulty of Paying Living Expenses: Not hard at all  Food Insecurity: No Food Insecurity (01/16/2023)   Hunger Vital Sign    Worried About Running Out of Food in the Last Year: Never true    Ran Out of Food in the Last Year: Never true  Transportation Needs: No Transportation Needs (01/16/2023)   PRAPARE - Administrator, Civil Service (Medical): No    Lack of Transportation (Non-Medical): No  Physical Activity: Inactive (06/10/2023)   Exercise Vital Sign    Days of Exercise per  Week: 0 days    Minutes of Exercise per Session: 0 min  Stress: No Stress Concern Present (01/16/2023)   [employer] of Occupational Health - Occupational Stress Questionnaire    Feeling of Stress : Not at all  Social Connections: Moderately Isolated (06/10/2023)   Social Connection and Isolation  Panel [NHANES]    Frequency of Communication with Friends and Family: More than three times a week    Frequency of Social Gatherings with Friends and Family: More than three times a week    Attends Religious Services: More than 4 times per year    Active Member of [employer] or Organizations: No    Attends Banker Meetings: Never    Marital Status: Widowed  Intimate Partner Violence: Not At Risk (06/08/2022)   Humiliation, Afraid, Rape, and Kick questionnaire    Fear of Current or Ex-Partner: No    Emotionally Abused: No    Physically Abused: No    Sexually Abused: No    Outpatient Medications Prior to Visit  Medication Sig Dispense Refill   albuterol (VENTOLIN HFA) 108 (90 Base) MCG/ACT inhaler INHALE 2 PUFFS BY MOUTH 4 TIMES DAILY 9 g 0   alclomethasone (ACLOVATE) 0.05 % cream Apply topically 2 (two) times daily as needed (Rash). 180 g 3   amLODipine (NORVASC) 10 MG tablet Take 1 tablet by mouth once daily 90 tablet 0   aspirin 81 MG chewable tablet Chew 1 tablet (81 mg total) by mouth daily.     clobetasol (TEMOVATE) 0.05 % external solution Apply 1 application topically 2 (two) times daily. 50 mL 6   clotrimazole-betamethasone (LOTRISONE) cream Apply 1 application topically 2 (two) times daily. 30 g 0   DULoxetine (CYMBALTA) 30 MG capsule Take 1 capsule (30 mg total) by mouth daily. 30 capsule 2   Evolocumab (REPATHA SURECLICK) 140 MG/ML SOAJ INJECT 140 MG SUBCUTANEOUSLY EVERY 14 DAYS 6 mL 0   fluconazole (DIFLUCAN) 150 MG tablet Take 1 tablet by mouth now- may repeat in 3 days if needed 2 tablet 0   fluticasone (FLONASE) 50 MCG/ACT nasal spray Place 2 sprays into both  nostrils daily. 16 g 1   furosemide (LASIX) 20 MG tablet Take 1 tablet (20 mg total) by mouth daily as needed (for shortness of breath or weight gain of 2 lbs overnight or 5 lbs in a week). 45 tablet 3   hyoscyamine (LEVSIN) 0.125 MG tablet Take 1 tablet (0.125 mg total) by mouth every 4 (four) hours as needed for cramping (diarrhea,nausea). 90 tablet 1   isosorbide mononitrate (IMDUR) 60 MG 24 hr tablet TAKE 1 & 1/2 (ONE & ONE-HALF) TABLETS BY MOUTH ONCE DAILY 135 tablet 3   loratadine (CLARITIN) 10 MG tablet Take 1 tablet (10 mg total) by mouth daily. 10 tablet 0   meclizine (ANTIVERT) 12.5 MG tablet Take 1 tablet (12.5 mg total) by mouth 3 (three) times daily as needed for dizziness. 30 tablet 0   methimazole (TAPAZOLE) 5 MG tablet Take 1 tablet by mouth every other day 45 tablet 3   methocarbamol (ROBAXIN) 500 MG tablet Take 1 tablet (500 mg total) by mouth every 8 (eight) hours as needed for muscle spasms. 30 tablet 0   metoCLOPramide (REGLAN) 5 MG tablet Take 1 tablet (5 mg total) by mouth 3 (three) times daily before meals. Schedule with GI for further refills 90 tablet 0   ondansetron (ZOFRAN) 4 MG tablet Take 1 tab by mouth every 6 hours as needed for nausea. 100 tablet 1   ondansetron (ZOFRAN-ODT) 8 MG disintegrating tablet Take 8 mg by mouth every 8 (eight) hours as needed for vomiting or nausea.     pantoprazole (PROTONIX) 40 MG tablet Take 1 tablet (40 mg total) by mouth daily. 30 tablet 5   potassium chloride SA (KLOR-CON M20)  20 MEQ tablet Take 1 tablet by mouth once daily 30 tablet 11   rosuvastatin (CRESTOR) 40 MG tablet Take 1 tablet by mouth once daily 90 tablet 0   XIIDRA 5 % SOLN Apply 1 drop to eye 2 (two) times daily.     No facility-administered medications prior to visit.    Allergies  Allergen Reactions   Ace Inhibitors Cough   Celebrex [Celecoxib] Other (See Comments)    "makes me bleed"   Diovan [Valsartan]     angioedema    Review of Systems  Cardiovascular:   Positive for chest pain.      See HPI Objective:    Physical Exam Constitutional:      General: She is not in acute distress.    Appearance: Normal appearance. She is well-developed. She is ill-appearing.  HENT:     Head: Normocephalic and atraumatic.     Right Ear: External ear normal.     Left Ear: External ear normal.  Eyes:     General: No scleral icterus. Neck:     Thyroid: No thyromegaly.  Cardiovascular:     Rate and Rhythm: Normal rate and regular rhythm.     Heart sounds: Normal heart sounds. No murmur heard. Pulmonary:     Effort: Pulmonary effort is normal. No respiratory distress.     Breath sounds: Normal breath sounds. No wheezing.  Musculoskeletal:     Cervical back: Neck supple.  Skin:    General: Skin is warm and dry.  Neurological:     Mental Status: She is alert and oriented to person, place, and time.  Psychiatric:        Mood and Affect: Mood normal.        Behavior: Behavior normal.        Thought Content: Thought content normal.        Judgment: Judgment normal.      BP 134/68 (BP Location: Right Arm, Patient Position: Sitting, Cuff Size: Normal)   Pulse 64   Temp 99.5 F (37.5 C) (Oral)   Resp 16   Ht 5\' 4"  (1.626 m)   Wt 233 lb (105.7 kg)   SpO2 99%   BMI 39.99 kg/m  Wt Readings from Last 3 Encounters:  08/06/23 233 lb (105.7 kg)  08/02/23 231 lb 12.8 oz (105.1 kg)  07/18/23 232 lb (105.2 kg)       Assessment & Plan:   Problem List Items Addressed This Visit       Unprioritized   Chest pain - Primary    Known hx of CAD. New onset chest pain with radiation to the left arm. Nitroglycerin provided only partial relief.EKG is personally reviewed and compared to EKG from 2/8.  Note is made of slight ST elevation in V4 and V5.   -Transfer to emergency department for cardiac enzyme testing to rule out myocardial infarction.      Relevant Orders   EKG 12-Lead (Completed)   Bulging of lumbar intervertebral disc   Report some sharp  shooting pain the the left leg intermittently- likely secondary to lumbar radiculopathy. Can address further after cardiac evaluation is complete.       Report give to ED physician Dr. Adela Lank at the [facility] ED. Pt was wheeled down by CMA to the ED.    I am having Aubry G. Shakoor maintain her aspirin, ondansetron, loratadine, fluticasone, alclomethasone, clobetasol, clotrimazole-betamethasone, albuterol, methocarbamol, hyoscyamine, Xiidra, ondansetron, furosemide, Klor-Con M20, isosorbide mononitrate, rosuvastatin, DULoxetine, metoCLOPramide, pantoprazole, amLODipine, Repatha  SureClick, fluconazole, methimazole, and meclizine.  No orders of the defined types were placed in this encounter.

## 2023-08-06 NOTE — ED Triage Notes (Signed)
Pt sent by PCP. Reports sharp left sided chest pain woke her up this morning. Pt reports she took nitroglycerin x 1 this morning which relieved pain but did not completely resolve.

## 2023-08-06 NOTE — ED Provider Notes (Signed)
[place] [facility] DEPARTMENT AT [facility] Provider Note   CSN: [id number] Arrival date & time: 08/06/23  1028     History  Chief Complaint  Patient presents with   Chest Pain    [name] is a 63 y.o. female.  63 yo F with a chief complaints of chest pain.  Patient states that she woke up this morning and noticed that she had a sharp left-sided chest pain that radiates down the left arm.  She took a nitroglycerin which she thinks made a little bit better.  Since then it has been persistent.  Nothing seems to make it better or worse.  She had a scheduled follow-up appointment with her family doctor because she was seen in the ER 3 days ago with dizziness.  She does not feel like that is changed.  Nothing seems to make that better or worse.  Not worse with head movement not worse with ambulation.  She denies cough congestion or fever.  Denies trauma.  Feels like she is been eating drinking normally.  She was started on a medication to help her with the sensation of dizziness but does not feel like its helped much.   Chest Pain      Home Medications Prior to Admission medications   Medication Sig Start Date End Date Taking? Authorizing Provider  diclofenac Sodium (VOLTAREN) 1 % GEL Apply 4 g topically 4 (four) times daily. 08/06/23  Yes Melene Plan, DO  albuterol (VENTOLIN HFA) 108 (90 Base) MCG/ACT inhaler INHALE 2 PUFFS BY MOUTH 4 TIMES DAILY 08/18/21   Sandford Craze, NP  alclomethasone (ACLOVATE) 0.05 % cream Apply topically 2 (two) times daily as needed (Rash). 01/26/21   Glyn Ade, PA-C  amLODipine (NORVASC) 10 MG tablet Take 1 tablet by mouth once daily 07/21/23   Sandford Craze, NP  aspirin 81 MG chewable tablet Chew 1 tablet (81 mg total) by mouth daily. 07/19/17   Standley Brooking, MD  clobetasol (TEMOVATE) 0.05 % external solution Apply 1 application topically 2 (two) times daily. 01/26/21   Sheffield, Judye Bos, PA-C   clotrimazole-betamethasone (LOTRISONE) cream Apply 1 application topically 2 (two) times daily. 05/01/21   Sharlene Dory, DO  DULoxetine (CYMBALTA) 30 MG capsule Take 1 capsule (30 mg total) by mouth daily. 04/26/23   Fuller Plan, MD  Evolocumab (REPATHA SURECLICK) 140 MG/ML SOAJ INJECT 140 MG SUBCUTANEOUSLY EVERY 14 DAYS 07/31/23   Pricilla Riffle, MD  fluconazole (DIFLUCAN) 150 MG tablet Take 1 tablet by mouth now- may repeat in 3 days if needed 07/30/23   Sandford Craze, NP  fluticasone (FLONASE) 50 MCG/ACT nasal spray Place 2 sprays into both nostrils daily. 01/17/21   Saguier, Ramon Dredge, PA-C  furosemide (LASIX) 20 MG tablet Take 1 tablet (20 mg total) by mouth daily as needed (for shortness of breath or weight gain of 2 lbs overnight or 5 lbs in a week). 08/22/22   Sharlene Dory, PA-C  hyoscyamine (LEVSIN) 0.125 MG tablet Take 1 tablet (0.125 mg total) by mouth every 4 (four) hours as needed for cramping (diarrhea,nausea). 10/13/21   Armbruster, Willaim Rayas, MD  isosorbide mononitrate (IMDUR) 60 MG 24 hr tablet TAKE 1 & 1/2 (ONE & ONE-HALF) TABLETS BY MOUTH ONCE DAILY 12/10/22   Pricilla Riffle, MD  loratadine (CLARITIN) 10 MG tablet Take 1 tablet (10 mg total) by mouth daily. 07/15/20   Renne Crigler, PA-C  meclizine (ANTIVERT) 12.5 MG tablet Take 1 tablet (12.5 mg  total) by mouth 3 (three) times daily as needed for dizziness. 08/03/23   Laurence Spates, MD  methimazole (TAPAZOLE) 5 MG tablet Take 1 tablet by mouth every other day 08/02/23   Carlus Pavlov, MD  methocarbamol (ROBAXIN) 500 MG tablet Take 1 tablet (500 mg total) by mouth every 8 (eight) hours as needed for muscle spasms. 09/15/21   Sandford Craze, NP  metoCLOPramide (REGLAN) 5 MG tablet Take 1 tablet (5 mg total) by mouth 3 (three) times daily before meals. Schedule with GI for further refills 06/21/23   Sandford Craze, NP  ondansetron (ZOFRAN) 4 MG tablet Take 1 tab by mouth every 6 hours as needed for nausea.  06/10/20   Sandford Craze, NP  ondansetron (ZOFRAN-ODT) 8 MG disintegrating tablet Take 8 mg by mouth every 8 (eight) hours as needed for vomiting or nausea. 07/18/11   [provider]  pantoprazole (PROTONIX) 40 MG tablet Take 1 tablet (40 mg total) by mouth daily. 06/27/23   Zehr, Princella Pellegrini, PA-C  potassium chloride SA (KLOR-CON M20) 20 MEQ tablet Take 1 tablet by mouth once daily 10/05/22   Pricilla Riffle, MD  rosuvastatin (CRESTOR) 40 MG tablet Take 1 tablet by mouth once daily 04/14/23   Sandford Craze, NP  XIIDRA 5 % SOLN Apply 1 drop to eye 2 (two) times daily. 06/12/22   [provider]      Allergies    Ace inhibitors, Celebrex [celecoxib], and Diovan [valsartan]    Review of Systems   Review of Systems  Cardiovascular:  Positive for chest pain.    Physical Exam Updated Vital Signs BP (!) 158/74   Pulse 62   Temp 98.4 F (36.9 C) (Oral)   Resp 16   Wt 105.7 kg   SpO2 99%   BMI 39.99 kg/m  Physical Exam Vitals and nursing note reviewed.  Constitutional:      General: She is not in acute distress.    Appearance: She is well-developed. She is not diaphoretic.  HENT:     Head: Normocephalic and atraumatic.  Eyes:     Pupils: Pupils are equal, round, and reactive to light.  Cardiovascular:     Rate and Rhythm: Normal rate and regular rhythm.     Heart sounds: No murmur heard.    No friction rub. No gallop.  Pulmonary:     Effort: Pulmonary effort is normal.     Breath sounds: No wheezing or rales.  Chest:     Chest wall: Tenderness present.     Comments: Pain on palpation of the left side of the chest reproduces her discomfort. Abdominal:     General: There is no distension.     Palpations: Abdomen is soft.     Tenderness: There is no abdominal tenderness.  Musculoskeletal:        General: No tenderness.     Cervical back: Normal range of motion and neck supple.  Skin:    General: Skin is warm and dry.  Neurological:     Mental  Status: She is alert and oriented to person, place, and time.  Psychiatric:        Behavior: Behavior normal.     ED Results / Procedures / Treatments   Labs (all labs ordered are listed, but only abnormal results are displayed) Labs Reviewed  BASIC METABOLIC PANEL - Abnormal; Notable for the following components:      Result Value   Glucose, Bld 127 (*)    All other components  within normal limits  CBC - Abnormal; Notable for the following components:   RBC 5.27 (*)    Hemoglobin 11.8 (*)    MCV 73.6 (*)    MCH 22.4 (*)    All other components within normal limits  TROPONIN I (HIGH SENSITIVITY)  TROPONIN I (HIGH SENSITIVITY)    EKG EKG Interpretation Date/Time:  Tuesday August 06 2023 10:38:49 EST Ventricular Rate:  58 PR Interval:  166 QRS Duration:  96 QT Interval:  491 QTC Calculation: 483 R Axis:   -5  Text Interpretation: Sinus rhythm Low voltage, precordial leads Nonspecific T abnormalities, lateral leads Baseline wander TECHNICALLY DIFFICULT Otherwise no significant change Confirmed by Melene Plan [phone]) on 08/06/2023 10:39:47 AM  Radiology DG Chest 2 View Result Date: 08/06/2023 CLINICAL DATA:  Chest pain. EXAM: CHEST - 2 VIEW COMPARISON:  Chest radiograph dated 08/14/2022. FINDINGS: Low lung volumes. The heart size is borderline enlarged. The mediastinal contours are within normal limits. No focal consolidation, sizeable pleural effusion, or pneumothorax. No acute osseous abnormality. IMPRESSION: 1. Low lung volumes.  No acute cardiopulmonary findings. 2. Borderline cardiomegaly. Electronically Signed   By: Hart Robinsons M.D.   On: 08/06/2023 12:21    Procedures Procedures    Medications Ordered in ED Medications  morphine (PF) 2 MG/ML injection 2 mg (2 mg Intravenous Given 08/06/23 1057)  ondansetron (ZOFRAN) injection 4 mg (4 mg Intravenous Given 08/06/23 1057)  morphine (PF) 2 MG/ML injection 2 mg (2 mg Intravenous Given 08/06/23 1235)  ondansetron  (ZOFRAN) injection 4 mg (4 mg Intravenous Given 08/06/23 1308)    ED Course/ Medical Decision Making/ A&P                                 Medical Decision Making Amount and/or Complexity of Data Reviewed Labs: ordered. Radiology: ordered.  Risk Prescription drug management.   63 yo F with a chief complaints of left-sided chest pain.  Woke up with it this morning.  Atypical in nature and reproduced on palpation.  She was seen in her family doctor's office and was sent down for further evaluation.  She was also recently seen for dizziness.  Does not feel like that is changed.  Persistent no obvious things that seem to make this better or worse.  Patient does have a history of coronary disease on my record review.  Has had cardiac catheterization x 2 without stenting.   Initial troponin negative.  Chest x-ray independently interpreted by me without focal true pneumothorax.  No acute anemia no significant electrolyte abnormalities.  I discussed case with Dr. Cristal Deer, cardiology she independently reviewed the patient's EKG and her medical record.  She thought of his second troponin was negative within reasonable to have the patient follow-up as an outpatient.  Second troponin is negative.  Discussed with patient.  Referral placed per current hospital protocol to follow-up with her cardiologist.  1:20 PM:  I have discussed the diagnosis/risks/treatment options with the patient.  Evaluation and diagnostic testing in the emergency department does not suggest an emergent condition requiring admission or immediate intervention beyond what has been performed at this time.  They will follow up with Cards, PCP. We also discussed returning to the ED immediately if new or worsening sx occur. We discussed the sx which are most concerning (e.g., sudden worsening pain, fever, inability to tolerate by mouth) that necessitate immediate return. Medications administered to the patient during their visit and  any new prescriptions provided to the patient are listed below.  Medications given during this visit Medications  morphine (PF) 2 MG/ML injection 2 mg (2 mg Intravenous Given 08/06/23 1057)  ondansetron (ZOFRAN) injection 4 mg (4 mg Intravenous Given 08/06/23 1057)  morphine (PF) 2 MG/ML injection 2 mg (2 mg Intravenous Given 08/06/23 1235)  ondansetron (ZOFRAN) injection 4 mg (4 mg Intravenous Given 08/06/23 1308)     The patient appears reasonably screen and/or stabilized for discharge and I doubt any other medical condition or other [facility] requiring further screening, evaluation, or treatment in the ED at this time prior to discharge.           Final Clinical Impression(s) / ED Diagnoses Final diagnoses:  Nonspecific chest pain    Rx / DC Orders ED Discharge Orders          Ordered    Ambulatory referral to Cardiology       Comments: If you have not heard from the Cardiology office within the next 72 hours please call [phone].   08/06/23 1314    diclofenac Sodium (VOLTAREN) 1 % GEL  4 times daily        08/06/23 1314              [place], DO 08/06/23 1320

## 2023-08-06 NOTE — Assessment & Plan Note (Signed)
  Known hx of CAD. New onset chest pain with radiation to the left arm. Nitroglycerin provided only partial relief.EKG is personally reviewed and compared to EKG from 2/8.  Note is made of slight ST elevation in V4 and V5.   -Transfer to emergency department for cardiac enzyme testing to rule out myocardial infarction.

## 2023-08-06 NOTE — Assessment & Plan Note (Signed)
Report some sharp shooting pain the the left leg intermittently- likely secondary to lumbar radiculopathy. Can address further after cardiac evaluation is complete.

## 2023-08-08 ENCOUNTER — Encounter: Payer: Self-pay | Admitting: Family

## 2023-08-09 ENCOUNTER — Other Ambulatory Visit (HOSPITAL_BASED_OUTPATIENT_CLINIC_OR_DEPARTMENT_OTHER): Payer: Self-pay

## 2023-08-09 ENCOUNTER — Ambulatory Visit: Payer: 59 | Admitting: Family

## 2023-08-09 ENCOUNTER — Telehealth: Payer: Self-pay | Admitting: Family

## 2023-08-09 ENCOUNTER — Encounter: Payer: Self-pay | Admitting: Family

## 2023-08-09 ENCOUNTER — Ambulatory Visit: Payer: Self-pay | Admitting: Family

## 2023-08-09 VITALS — BP 141/75 | HR 74 | Temp 99.2°F | Resp 16 | Ht 64.0 in | Wt 232.0 lb

## 2023-08-09 DIAGNOSIS — H9203 Otalgia, bilateral: Secondary | ICD-10-CM

## 2023-08-09 DIAGNOSIS — E119 Type 2 diabetes mellitus without complications: Secondary | ICD-10-CM

## 2023-08-09 DIAGNOSIS — M5416 Radiculopathy, lumbar region: Secondary | ICD-10-CM | POA: Diagnosis not present

## 2023-08-09 DIAGNOSIS — R079 Chest pain, unspecified: Secondary | ICD-10-CM

## 2023-08-09 DIAGNOSIS — E785 Hyperlipidemia, unspecified: Secondary | ICD-10-CM

## 2023-08-09 DIAGNOSIS — M5442 Lumbago with sciatica, left side: Secondary | ICD-10-CM

## 2023-08-09 DIAGNOSIS — Z1231 Encounter for screening mammogram for malignant neoplasm of breast: Secondary | ICD-10-CM

## 2023-08-09 LAB — LIPID PANEL
Cholesterol: 137 mg/dL (ref 0–200)
HDL: 41.8 mg/dL (ref >39.00)
LDL Cholesterol: 67 mg/dL (ref 0–99)
NonHDL: 95.68
Total CHOL/HDL Ratio: 3
Triglycerides: 144 mg/dL (ref 0.0–149.0)
VLDL: 28.8 mg/dL (ref 0.0–40.0)

## 2023-08-09 LAB — HEMOGLOBIN A1C: Hgb A1c MFr Bld: 7.1 % — ABNORMAL HIGH (ref 4.6–6.5)

## 2023-08-09 LAB — MICROALBUMIN / CREATININE URINE RATIO
Creatinine,U: 221 mg/dL
Microalb Creat Ratio: 16.1 mg/g (ref 0.0–30.0)
Microalb, Ur: 3.6 mg/dL — ABNORMAL HIGH (ref 0.0–1.9)

## 2023-08-09 MED ORDER — TRAMADOL HCL 50 MG PO TABS
50.0000 mg | ORAL_TABLET | Freq: Four times a day (QID) | ORAL | 0 refills | Status: AC | PRN
  Filled 2023-08-09: qty 20, 5d supply, fill #0

## 2023-08-09 NOTE — Progress Notes (Signed)
 Subjective:     Patient ID: [name], female    DOB: 20-Feb-1961, 63 y.o.   MRN: [id number]  Chief Complaint  Patient presents with   Otalgia    Patient complains of still having bilateral ear pain     Otalgia     Discussed the use of AI scribe software for clinical note transcription with the patient, who gave verbal consent to proceed.  History of Present Illness   [name] is a 63 year old female who presents with persistent ear pain.  She has been experiencing persistent bilateral ear pain for the past week. Previously, she was prescribed Augmentin and prednisone on July 18, 2023, but has not noticed any improvement in her symptoms. No ear drainage, fever, sinus pain, or congestion is present. She has a history of ear surgery and tube placement by Dr. Esmeralda Arthur in [place].  She also experiences intermittent sharp shooting pain radiating down the back of her left leg. This pain has been ongoing and occurs with the same frequency as before. She has tried Tylenol and topical vitamin application for relief, with prednisone providing some brief improvement. She has a history of seeing a back specialist, Dr. Ollen Bowl, and has received injections for her back pain.     Also of note, she was sent to the ED following OV last due to chest pain.  Her serial troponins were WNL and she was discharged home.   Lab Results  Component Value Date   HGBA1C 7.1 (H) 08/09/2023   HGBA1C 6.2 02/03/2022   HGBA1C 6.8 (H) 12/01/2021   Lab Results  Component Value Date   MICROALBUR 3.6 (H) 08/09/2023   LDLCALC 67 08/09/2023   CREATININE 0.98 08/06/2023       Health Maintenance Due  Topic Date Due   OPHTHALMOLOGY EXAM  Never done   FOOT EXAM  05/30/2013   DTaP/Tdap/Td (3 - Td or Tdap) 04/29/2021   MAMMOGRAM  09/29/2022   COVID-19 Vaccine (5 - 2024-25 season) 02/24/2023    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- benign   Iron deficiency anemia    Migraine headache    Non-compliance    Peptic ulcer disease    Thyroid disease     Past Surgical History:  Procedure Laterality Date   ABDOMINAL EXPLORATION SURGERY     w/bso    BREAST BIOPSY     CARDIAC CATHETERIZATION  2009   mild non obstructive CAD   CHOLECYSTECTOMY     COLONOSCOPY     ESOPHAGEAL MANOMETRY N/A 08/01/2020   Procedure: ESOPHAGEAL MANOMETRY (EM);  Surgeon: Benancio Deeds, MD;  Location: [facility];  Service: Gastroenterology;  Laterality: N/A;   KNEE SURGERY  2005    left knee   LEFT HEART CATH AND CORONARY ANGIOGRAPHY N/A 07/17/2017   Procedure: LEFT HEART CATH AND CORONARY ANGIOGRAPHY;  Surgeon: Swaziland, Peter M, MD;  Location: [facility] INVASIVE CV  LAB;  Service: Cardiovascular;  Laterality: N/A;   LEFT HEART CATH AND CORONARY ANGIOGRAPHY N/A 09/01/2021   Procedure: LEFT HEART CATH AND CORONARY ANGIOGRAPHY;  Surgeon: Kathleene Hazel, MD;  Location: [facility] INVASIVE CV LAB;  Service: Cardiovascular;  Laterality: N/A;   POLYPECTOMY     TOTAL ABDOMINAL HYSTERECTOMY     UPPER GASTROINTESTINAL ENDOSCOPY      Family History  Problem Relation Age of Onset   Hypertension Mother    Rheum arthritis Mother    Hypertension Father    Diabetes Father    Prostate cancer Father    Kidney disease Father    Diabetes Sister    Fibromyalgia Sister    Diabetes Maternal Grandmother    Lung cancer Maternal Grandfather    Arthritis Brother    Migraines Brother    CAD Brother    Fibromyalgia Sister    Migraines Sister    Colon cancer Neg Hx     Thyroid disease Neg Hx    Colon polyps Neg Hx     Social History   Socioeconomic History   Marital status: Widowed    Spouse name: Not on file   Number of children: 2   Years of education: Not on file   Highest education level: Not on file  Occupational History   Occupation: DISABILITY    Employer: UNEMPLOYED  Tobacco Use   Smoking status: Former    Current packs/day: 0.00    Average packs/day: 0.5 packs/day for 18.0 years (9.0 ttl pk-yrs)    Types: Cigarettes    Start date: 07/29/1977    Quit date: 06/25/1994    Years since quitting: 29.1   Smokeless tobacco: Never   Tobacco comments:    quit 19 years ago  Vaping Use   Vaping status: Never Used  Substance and Sexual Activity   Alcohol use: Yes    Alcohol/week: 0.0 standard drinks of alcohol    Comment: social drinker   Drug use: No   Sexual activity: Not Currently  Other Topics Concern   Not on file  Social History Narrative   Widowed   Has 2 grown children.  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    Former Smoker Quit tobacco in 1996.  She was a pack a day smoker for approximately 10 years.    Alcohol use-yes: Social    Daily Caffeine Use:6 pack of pepsi daily     Illicit Drug Use - no    Patient does not get regular exercise.       Smoking Status:  quit   Social Drivers of Health   Financial Resource Strain: Low Risk  (01/16/2023)   Overall Financial Resource Strain (CARDIA)    Difficulty of Paying Living Expenses: Not hard at all  Food Insecurity: No Food Insecurity (01/16/2023)   Hunger Vital Sign    Worried About Running Out of Food in the Last Year: Never true    Ran Out of Food in the Last Year: Never true  Transportation Needs: No Transportation Needs (01/16/2023)   PRAPARE - Administrator, Civil Service (Medical): No    Lack of Transportation (Non-Medical): No  Physical Activity: Inactive (06/10/2023)   Exercise Vital Sign    Days of Exercise  per Week: 0 days    Minutes of Exercise per Session: 0 min  Stress: No Stress Concern Present (01/16/2023)   [employer] of Occupational Health - Occupational Stress Questionnaire  Feeling of Stress : Not at all  Social Connections: Moderately Isolated (06/10/2023)   Social Connection and Isolation Panel [NHANES]    Frequency of Communication with Friends and Family: More than three times a week    Frequency of Social Gatherings with Friends and Family: More than three times a week    Attends Religious Services: More than 4 times per year    Active Member of [employer] or Organizations: No    Attends Banker Meetings: Never    Marital Status: Widowed  Intimate Partner Violence: Not At Risk (06/08/2022)   Humiliation, Afraid, Rape, and Kick questionnaire    Fear of Current or Ex-Partner: No    Emotionally Abused: No    Physically Abused: No    Sexually Abused: No    Outpatient Medications Prior to Visit  Medication Sig Dispense Refill   albuterol (VENTOLIN HFA) 108 (90 Base) MCG/ACT inhaler INHALE 2 PUFFS BY MOUTH 4 TIMES DAILY 9 g 0   alclomethasone (ACLOVATE) 0.05 % cream Apply topically 2 (two) times daily as needed (Rash). 180 g 3   amLODipine (NORVASC) 10 MG tablet Take 1 tablet by mouth once daily 90 tablet 0   aspirin 81 MG chewable tablet Chew 1 tablet (81 mg total) by mouth daily.     clobetasol (TEMOVATE) 0.05 % external solution Apply 1 application topically 2 (two) times daily. 50 mL 6   clotrimazole-betamethasone (LOTRISONE) cream Apply 1 application topically 2 (two) times daily. 30 g 0   diclofenac Sodium (VOLTAREN) 1 % GEL Apply 4 g topically 4 (four) times daily. 100 g 0   DULoxetine (CYMBALTA) 30 MG capsule Take 1 capsule (30 mg total) by mouth daily. 30 capsule 2   Evolocumab (REPATHA SURECLICK) 140 MG/ML SOAJ INJECT 140 MG SUBCUTANEOUSLY EVERY 14 DAYS 6 mL 0   fluconazole (DIFLUCAN) 150 MG tablet Take 1 tablet by mouth now- may repeat in 3 days if  needed 2 tablet 0   fluticasone (FLONASE) 50 MCG/ACT nasal spray Place 2 sprays into both nostrils daily. 16 g 1   furosemide (LASIX) 20 MG tablet Take 1 tablet (20 mg total) by mouth daily as needed (for shortness of breath or weight gain of 2 lbs overnight or 5 lbs in a week). 45 tablet 3   hyoscyamine (LEVSIN) 0.125 MG tablet Take 1 tablet (0.125 mg total) by mouth every 4 (four) hours as needed for cramping (diarrhea,nausea). 90 tablet 1   isosorbide mononitrate (IMDUR) 60 MG 24 hr tablet TAKE 1 & 1/2 (ONE & ONE-HALF) TABLETS BY MOUTH ONCE DAILY 135 tablet 3   loratadine (CLARITIN) 10 MG tablet Take 1 tablet (10 mg total) by mouth daily. 10 tablet 0   meclizine (ANTIVERT) 12.5 MG tablet Take 1 tablet (12.5 mg total) by mouth 3 (three) times daily as needed for dizziness. 30 tablet 0   methimazole (TAPAZOLE) 5 MG tablet Take 1 tablet by mouth every other day 45 tablet 3   methocarbamol (ROBAXIN) 500 MG tablet Take 1 tablet (500 mg total) by mouth every 8 (eight) hours as needed for muscle spasms. 30 tablet 0   metoCLOPramide (REGLAN) 5 MG tablet Take 1 tablet (5 mg total) by mouth 3 (three) times daily before meals. Schedule with GI for further refills 90 tablet 0   ondansetron (ZOFRAN) 4 MG tablet Take 1 tab by mouth every 6 hours as needed for nausea. 100 tablet 1   ondansetron (ZOFRAN-ODT) 8 MG disintegrating tablet Take 8 mg by mouth  every 8 (eight) hours as needed for vomiting or nausea.     pantoprazole (PROTONIX) 40 MG tablet Take 1 tablet (40 mg total) by mouth daily. 30 tablet 5   potassium chloride SA (KLOR-CON M20) 20 MEQ tablet Take 1 tablet by mouth once daily 30 tablet 11   rosuvastatin (CRESTOR) 40 MG tablet Take 1 tablet by mouth once daily 90 tablet 0   XIIDRA 5 % SOLN Apply 1 drop to eye 2 (two) times daily.     No facility-administered medications prior to visit.    Allergies  Allergen Reactions   Ace Inhibitors Cough   Celebrex [Celecoxib] Other (See Comments)    "makes  me bleed"   Diovan [Valsartan]     angioedema    Review of Systems  HENT:  Positive for ear pain.        Objective:    Physical Exam Constitutional:      General: She is not in acute distress.    Appearance: Normal appearance. She is well-developed.  HENT:     Head: Normocephalic and atraumatic.     Comments: Silvery opacification of both TM's R TM has scarring from previous tympanostomy tube- it is not clear if it is patent or healed over on exam    Right Ear: External ear normal.     Left Ear: External ear normal.  Eyes:     General: No scleral icterus. Neck:     Thyroid: No thyromegaly.  Cardiovascular:     Rate and Rhythm: Normal rate and regular rhythm.     Heart sounds: Normal heart sounds. No murmur heard. Pulmonary:     Effort: Pulmonary effort is normal. No respiratory distress.     Breath sounds: Normal breath sounds. No wheezing.  Musculoskeletal:     Cervical back: Neck supple.  Skin:    General: Skin is warm and dry.  Neurological:     Mental Status: She is alert and oriented to person, place, and time.  Psychiatric:        Mood and Affect: Mood normal.        Behavior: Behavior normal.        Thought Content: Thought content normal.        Judgment: Judgment normal.      BP (!) 141/75 (BP Location: Right Arm, Patient Position: Sitting, Cuff Size: Large)   Pulse 74   Temp 99.2 F (37.3 C) (Oral)   Resp 16   Ht 5\' 4"  (1.626 m)   Wt 232 lb (105.2 kg)   SpO2 98%   BMI 39.82 kg/m  Wt Readings from Last 3 Encounters:  08/09/23 232 lb (105.2 kg)  08/06/23 233 lb (105.7 kg)  08/06/23 233 lb (105.7 kg)       Assessment & Plan:   Problem List Items Addressed This Visit       Unprioritized   Otalgia of both ears   No significant improvement with oral steroids or antibiotics.   No drainage or fever. -Start Flonase and Claritin over the counter. -Keep ENT appointment next Friday- suspect she may need replacement of tympanostomy tubes -Use  Tylenol as needed for pain.      Lumbar radiculopathy    Intermittent sharp shooting pain radiating down the back of the left leg. Some brief improvement with Prednisone. No recent imaging or specialist evaluation. -Refer to Dr. Ollen Bowl for evaluation. -Short course of pain medication for 5 days.      Low back pain - Primary  Relevant Medications   traMADol (ULTRAM) 50 MG tablet   Other Relevant Orders   Ambulatory referral to Neurosurgery   Hyperlipidemia   Relevant Orders   Lipid panel (Completed)   Controlled type 2 diabetes mellitus without complication, without long-term current use of insulin (HCC)   Relevant Orders   HgB A1c (Completed)   Urine Microalbumin w/creat. ratio (Completed)   Chest pain   Neg ED evaluation last week. Will arrange follow up with her cardiologist for ongoing management of her CAD.       Other Visit Diagnoses       Breast cancer screening by mammogram       Relevant Orders   MM 3D SCREENING MAMMOGRAM BILATERAL BREAST      40 minutes spent on today's visit. Time was spent interviewing patient, reviewing outside records and formulating complex medical plan. I am having Llewellyn G. Riede start on traMADol. I am also having her maintain her aspirin, ondansetron, loratadine, fluticasone, alclomethasone, clobetasol, clotrimazole-betamethasone, albuterol, methocarbamol, hyoscyamine, Xiidra, ondansetron, furosemide, Klor-Con M20, isosorbide mononitrate, rosuvastatin, DULoxetine, metoCLOPramide, pantoprazole, amLODipine, Repatha SureClick, fluconazole, methimazole, meclizine, and diclofenac Sodium.  Meds ordered this encounter  Medications   traMADol (ULTRAM) 50 MG tablet    Sig: Take 1 tablet (50 mg total) by mouth every 6 (six) hours as needed for up to 5 days.    Dispense:  20 tablet    Refill:  0    Supervising Provider:   Danise Edge A [[id number]]

## 2023-08-09 NOTE — Telephone Encounter (Signed)
Request was faxed

## 2023-08-09 NOTE — Telephone Encounter (Signed)
Chief Complaint: ear pain Symptoms: bilateral ear pain and fullness with dizziness Frequency: 1 wk Pertinent Negatives: Patient denies fever, CP, SOB Disposition: [] ED /[] Urgent Care (no appt availability in office) / [x] Appointment(In office/virtual)/ []  [facility]/ [] Home Care/ [] Refused Recommended Disposition /[] [facility]/ []  Follow-up with PCP Additional Notes: Bilateral eear pain and fullness with dizziness for 1 wk. Pt seen for the same on 2/7. Pt taking prescribed antibiotic and prednisone with no improvement. Pt denies fever. Pt states she can't see an ENT doctor until 2/21. Pt also states her thyroid doctor looked at her ears recently and said they "have a lot of liquid." Per protocol, RN scheduled pt to be seen today at 1300 in the office. Pt was agreeable. RN advised pt to call back for any worsening, she verbalized understanding.   Copied from CRM [id number]. Topic: Clinical - Red Word Triage >> Aug 09, 2023  8:47 AM Shelbie Proctor wrote: Red Word that prompted transfer to Nurse Triage: Patient [phone] states her both ears are still bothering her, feels like fluid in the ears, dizziness, light headed, discomfort, pain. Patient saw provided 08/06/23 and ears was not checked. ENT visit was reschedule until 08/16/23. Reason for Disposition  [1] SEVERE pain AND [2] not improved 2 hours after taking analgesic medication (e.g., ibuprofen or acetaminophen)  Answer Assessment - Initial Assessment Questions 1. LOCATION: "Which ear is involved?"     Both ears 2. ONSET: "When did the ear start hurting"      1 wk 3. SEVERITY: "How bad is the pain?"  (Scale 1-10; mild, moderate or severe)   - MILD (1-3): doesn't interfere with normal activities    - MODERATE (4-7): interferes with normal activities or awakens from sleep    - SEVERE (8-10): excruciating pain, unable to do any normal activities      6/10 4. URI SYMPTOMS: "Do you have a runny nose or cough?"     No 5.  FEVER: "Do you have a fever?" If Yes, ask: "What is your temperature, how was it measured, and when did it start?"     No 6. CAUSE: "Have you been swimming recently?", "How often do you use Q-TIPS?", "Have you had any recent air travel or scuba diving?"     "I can't swim anymore and I have to take baths" 7. OTHER SYMPTOMS: "Do you have any other symptoms?" (e.g., headache, stiff neck, dizziness, vomiting, runny nose, decreased hearing)     ENT for 2/21. Seen on 2/7 and prescribed antibiotic and prednisone - no improvement. No discharge/drainage from the ears. "Thyroid doctor said both ears still had a lot of fluid." Endorses dizziness and lightheadedness for a week as well. No CP. No SOB. "When I get up I am lightheaded, but I don't feel like I am going to pass out." "Last week I did think I was going to pass out when I came in there and then went to the ER."  Protocols used: Davina Poke

## 2023-08-09 NOTE — Telephone Encounter (Signed)
Please call Eye doctor [facility] on [address] to request copy of DM eye exam.

## 2023-08-10 DIAGNOSIS — H9203 Otalgia, bilateral: Secondary | ICD-10-CM | POA: Insufficient documentation

## 2023-08-10 NOTE — Assessment & Plan Note (Signed)
  Intermittent sharp shooting pain radiating down the back of the left leg. Some brief improvement with Prednisone. No recent imaging or specialist evaluation. -Refer to Dr. Ollen Bowl for evaluation. -Short course of pain medication for 5 days.

## 2023-08-10 NOTE — Assessment & Plan Note (Signed)
 No significant improvement with oral steroids or antibiotics.   No drainage or fever. -Start Flonase and Claritin over the counter. -Keep ENT appointment next Friday- suspect she may need replacement of tympanostomy tubes -Use Tylenol as needed for pain.

## 2023-08-10 NOTE — Assessment & Plan Note (Signed)
 Neg ED evaluation last week. Will arrange follow up with her cardiologist for ongoing management of her CAD.

## 2023-08-12 ENCOUNTER — Telehealth: Payer: Self-pay | Admitting: Family

## 2023-08-12 MED ORDER — METFORMIN HCL 500 MG PO TABS: 500.0000 mg | ORAL_TABLET | Freq: Two times a day (BID) | ORAL | 1 refills | Status: DC

## 2023-08-12 NOTE — Telephone Encounter (Signed)
 Sugar is slightly above goal. Please continue to work on diet, exercise and weight loss.  I would also like for her to begin metformin BID.

## 2023-08-13 NOTE — Telephone Encounter (Signed)
 Patient notified of results and new prescription. She verbalized understanding

## 2023-08-16 ENCOUNTER — Encounter: Payer: Self-pay | Admitting: Family Medicine

## 2023-08-16 ENCOUNTER — Ambulatory Visit: Payer: Self-pay | Admitting: Family

## 2023-08-16 ENCOUNTER — Ambulatory Visit: Payer: 59 | Admitting: Family Medicine

## 2023-08-16 VITALS — BP 142/82 | HR 73 | Temp 98.3°F | Resp 18 | Ht 64.0 in | Wt 227.4 lb

## 2023-08-16 DIAGNOSIS — Z7984 Long term (current) use of oral hypoglycemic drugs: Secondary | ICD-10-CM | POA: Diagnosis not present

## 2023-08-16 DIAGNOSIS — H6993 Unspecified Eustachian tube disorder, bilateral: Secondary | ICD-10-CM | POA: Diagnosis not present

## 2023-08-16 DIAGNOSIS — E1165 Type 2 diabetes mellitus with hyperglycemia: Secondary | ICD-10-CM

## 2023-08-16 MED ORDER — BLOOD GLUCOSE TEST VI STRP: 1.0000 | ORAL_STRIP | Freq: Three times a day (TID) | 0 refills | Status: DC

## 2023-08-16 MED ORDER — BLOOD GLUCOSE MONITORING SUPPL DEVI: 1.0000 | Freq: Three times a day (TID) | 0 refills | Status: AC

## 2023-08-16 MED ORDER — LANCET DEVICE MISC: 1.0000 | Freq: Three times a day (TID) | 0 refills | Status: AC

## 2023-08-16 MED ORDER — DAPAGLIFLOZIN PROPANEDIOL 5 MG PO TABS: 5.0000 mg | ORAL_TABLET | Freq: Every day | ORAL | 2 refills | Status: DC

## 2023-08-16 MED ORDER — LANCETS MISC. MISC
1.0000 | Freq: Three times a day (TID) | 0 refills | Status: AC
Start: 2023-08-16 — End: 2023-09-15

## 2023-08-16 NOTE — Assessment & Plan Note (Signed)
 Stop metformin Start farxiga daily and f/u pcp in 3 months  Pt is eating and drinking normally---no N//v/D since Wednesday

## 2023-08-16 NOTE — Progress Notes (Signed)
 Established Patient Office Visit  Subjective   Patient ID: [name], female    DOB: [birth year]  Age: 63 y.o. MRN: [id number]  Chief Complaint  Patient presents with   Emesis    Pt states taking Metformin on Tuesday, 1st time taking the medication, pt states having nausea, vomiting and diarrhea    HPI Discussed the use of AI scribe software for clinical note transcription with the patient, who gave verbal consent to proceed.  History of Present Illness   [name] is a 63 year old female with diabetes who presents with adverse effects from metformin.  She experienced gastrointestinal symptoms, including diarrhea, cramping, and vomiting, after taking metformin for the first time on Tuesday. She took another dose on Wednesday but has not taken any more since then. She describes the experience as feeling 'like eating' and has not had any further episodes of vomiting or diarrhea since stopping the medication. No ongoing diarrhea, vomiting, or stomach pain since discontinuing metformin.  Her hemoglobin A1c levels have increased, indicating a need for continued management of her diabetes. Metformin was the only medication she was taking for her diabetes prior to this visit. She does not currently monitor her blood sugar levels at home and has not been instructed on how to do so.      Patient Active Problem List   Diagnosis Date Noted   Type 2 diabetes mellitus with hyperglycemia, without long-term current use of insulin (HCC) 08/16/2023   Otalgia of both ears 08/10/2023   Cramping of hands 04/16/2023   Positive ANA (antinuclear antibody) 03/22/2023   Sedimentation rate elevation 03/22/2023   Skin cyst 11/02/2022   Arthralgia 11/02/2022   Bulging of lumbar intervertebral disc 09/24/2022   Lumbar radiculopathy 09/24/2022   Left arm pain 08/14/2022   Otitis externa of right ear 06/13/2022   Arthritis 05/11/2022   Edema 01/12/2022   Controlled type 2 diabetes mellitus  without complication, without long-term current use of insulin (HCC) 12/01/2021   Facial abscess 09/15/2021   Non-functioning tympanostomy tube 08/25/2021   Cough 08/25/2021   Bilateral impacted cerumen 06/30/2021   Coronary artery disease of native heart with stable angina pectoris (HCC) 06/30/2021   Intertrigo 05/26/2021   DOE (dyspnea on exertion) 12/30/2020   Esophageal dysphagia    Hypercontractile esophagus    Colitis 02/04/2018   Aortic atherosclerosis (HCC) 07/17/2017   Chest pain 07/16/2017   Insomnia 01/12/2017   Breast cyst 06/09/2012   Hyperthyroidism 02/29/2012   Constipation 04/18/2011   Can't get food down 04/18/2011   Ulnar neuropathy 03/30/2011   Low back pain 10/18/2010   Diarrhea 11/01/2009   Fibromyalgia 07/20/2009   Hyperlipidemia 05/26/2009   Vitamin D deficiency 05/23/2009   Polyarthralgia 04/12/2009   Iron deficiency anemia 10/25/2008   GERD 10/25/2008   LIVER HEMANGIOMA 01/01/2008   ESOPHAGEAL STRICTURE 01/01/2008   Gastroparesis 01/01/2008   Anemia 12/08/2007   Allergic rhinitis 12/08/2007   LUQ abdominal pain 09/23/2007   Migraine headache 08/11/2007   HIATAL HERNIA 08/11/2007   OVARIAN CYST 08/11/2007   Essential hypertension 05/06/2007   Past Medical History:  Diagnosis Date   Allergy    allergic rhinitis   Arthritis    Atypical chest pain    Constipation    Cyst, ovarian    Diabetes mellitus, type II (HCC)    Elevated alkaline phosphatase level    Endometriosis    Esophageal stricture    Fatty liver    Gastroparesis    GERD (  gastroesophageal reflux disease)    Heart murmur    Hepatic hemangioma    Hyperlipidemia    Hypertension    Hypertrophic condition of skin    acrokeratoelastoidosis- s/p 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 1/08- benign   Iron deficiency anemia    Migraine headache    Non-compliance    Peptic ulcer disease    Thyroid disease    Past Surgical History:  Procedure Laterality Date   ABDOMINAL EXPLORATION SURGERY     w/bso     BREAST BIOPSY     CARDIAC CATHETERIZATION  2009   mild non obstructive CAD   CHOLECYSTECTOMY     COLONOSCOPY     ESOPHAGEAL MANOMETRY N/A 08/01/2020   Procedure: ESOPHAGEAL MANOMETRY (EM);  Surgeon: Benancio Deeds, MD;  Location: [facility];  Service: Gastroenterology;  Laterality: N/A;   KNEE SURGERY  2005    left knee   LEFT HEART CATH AND CORONARY ANGIOGRAPHY N/A 07/17/2017   Procedure: LEFT HEART CATH AND CORONARY ANGIOGRAPHY;  Surgeon: Swaz[place], Peter M, MD;  Location: [facility] INVASIVE CV LAB;  Service: Cardiovascular;  Laterality: N/A;   LEFT HEART CATH AND CORONARY ANGIOGRAPHY N/A 09/01/2021   Procedure: LEFT HEART CATH AND CORONARY ANGIOGRAPHY;  Surgeon: Kathleene Hazel, MD;  Location: [facility] INVASIVE CV LAB;  Service: Cardiovascular;  Laterality: N/A;   POLYPECTOMY     TOTAL ABDOMINAL HYSTERECTOMY     UPPER GASTROINTESTINAL ENDOSCOPY     Social History   Tobacco Use   Smoking status: Former    Current packs/day: 0.00    Average packs/day: 0.5 packs/day for 18.0 years (9.0 ttl pk-yrs)    Types: Cigarettes    Start date: 07/29/1977    Quit date: 06/25/1994    Years since quitting: 29.1   Smokeless tobacco: Never   Tobacco comments:    quit 19 years ago  Vaping Use   Vaping status: Never Used  Substance Use Topics   Alcohol use: Yes    Alcohol/week: 0.0 standard drinks of alcohol    Comment: social drinker   Drug use: No   Social History   Socioeconomic History   Marital status: Widowed    Spouse name: Not on file   Number of children: 2   Years of education: Not on file   Highest education level: Not on file  Occupational History   Occupation: DISABILITY    Employer: UNEMPLOYED  Tobacco Use   Smoking status: Former    Current packs/day: 0.00    Average packs/day: 0.5 packs/day for 18.0 years (9.0 ttl pk-yrs)    Types: Cigarettes    Start date: 07/29/1977    Quit date: 06/25/1994    Years since quitting: 29.1   Smokeless tobacco: Never   Tobacco comments:     quit 19 years ago  Vaping Use   Vaping status: Never Used  Substance and Sexual Activity   Alcohol use: Yes    Alcohol/week: 0.0 standard drinks of alcohol    Comment: social drinker   Drug use: No   Sexual activity: Not Currently  Other Topics Concern   Not on file  Social History Narrative   Widowed   Has 2 grown children.  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    Former Smoker Quit tobacco in 1996.  She was a pack a day smoker for approximately 10 years.    Alcohol use-yes: Social    Daily Caffeine Use:6 pack of pepsi daily     Illicit  Drug Use - no    Patient does not get regular exercise.       Smoking Status:  quit   Social Drivers of Health   Financial Resource Strain: Low Risk  (01/16/2023)   Overall Financial Resource Strain (CARDIA)    Difficulty of Paying Living Expenses: Not hard at all  Food Insecurity: No Food Insecurity (01/16/2023)   Hunger Vital Sign    Worried About Running Out of Food in the Last Year: Never true    Ran Out of Food in the Last Year: Never true  Transportation Needs: No Transportation Needs (01/16/2023)   PRAPARE - Administrator, Civil Service (Medical): No    Lack of Transportation (Non-Medical): No  Physical Activity: Inactive (06/10/2023)   Exercise Vital Sign    Days of Exercise per Week: 0 days    Minutes of Exercise per Session: 0 min  Stress: No Stress Concern Present (01/16/2023)   [employer] of Occupational Health - Occupational Stress Questionnaire    Feeling of Stress : Not at all  Social Connections: Moderately Isolated (06/10/2023)   Social Connection and Isolation Panel [NHANES]    Frequency of Communication with Friends and Family: More than three times a week    Frequency of Social Gatherings with Friends and Family: More than three times a week    Attends Religious Services: More than 4 times per year    Active Member of [employer] or Organizations: No     Attends Banker Meetings: Never    Marital Status: Widowed  Intimate Partner Violence: Not At Risk (06/08/2022)   Humiliation, Afraid, Rape, and Kick questionnaire    Fear of Current or Ex-Partner: No    Emotionally Abused: No    Physically Abused: No    Sexually Abused: No   Family Status  Relation Name Status   Mother  Alive   Father  Deceased   Sister  Alive   MGM  (Not Specified)   MGF  Deceased   Brother  Alive   Brother  Alive   Brother  Alive   Brother  Alive   Brother  Alive   Sister  Alive   Neg Hx  (Not Specified)  No partnership data on file   Family History  Problem Relation Age of Onset   Hypertension Mother    Rheum arthritis Mother    Hypertension Father    Diabetes Father    Prostate cancer Father    Kidney disease Father    Diabetes Sister    Fibromyalgia Sister    Diabetes Maternal Grandmother    Lung cancer Maternal Grandfather    Arthritis Brother    Migraines Brother    CAD Brother    Fibromyalgia Sister    Migraines Sister    Colon cancer Neg Hx    Thyroid disease Neg Hx    Colon polyps Neg Hx    Allergies  Allergen Reactions   Ace Inhibitors Cough   Celebrex [Celecoxib] Other (See Comments)    "makes me bleed"   Diovan [Valsartan]     angioedema      Review of Systems  Constitutional:  Negative for chills, fever and malaise/fatigue.  HENT:  Negative for congestion and hearing loss.   Eyes:  Negative for discharge.  Respiratory:  Negative for cough, sputum production and shortness of breath.   Cardiovascular:  Negative for chest pain, palpitations and leg swelling.  Gastrointestinal:  Negative for abdominal pain, blood in stool,  constipation, diarrhea, heartburn, nausea and vomiting.  Genitourinary:  Negative for dysuria, frequency, hematuria and urgency.  Musculoskeletal:  Negative for back pain, falls and myalgias.  Skin:  Negative for rash.  Neurological:  Negative for dizziness, sensory change, loss of  consciousness, weakness and headaches.  Endo/Heme/Allergies:  Negative for environmental allergies. Does not bruise/bleed easily.  Psychiatric/Behavioral:  Negative for depression and suicidal ideas. The patient is not nervous/anxious and does not have insomnia.       Objective:     BP (!) 142/82 (BP Location: Left Arm, Patient Position: Sitting, Cuff Size: Large)   Pulse 73   Temp 98.3 F (36.8 C) (Oral)   Resp 18   Ht 5\' 4"  (1.626 m)   Wt 227 lb 6.4 oz (103.1 kg)   SpO2 97%   BMI 39.03 kg/m  BP Readings from Last 3 Encounters:  08/16/23 (!) 142/82  08/09/23 (!) 141/75  08/06/23 (!) 158/74   Wt Readings from Last 3 Encounters:  08/16/23 227 lb 6.4 oz (103.1 kg)  08/09/23 232 lb (105.2 kg)  08/06/23 233 lb (105.7 kg)   SpO2 Readings from Last 3 Encounters:  08/16/23 97%  08/09/23 98%  08/06/23 99%      Physical Exam Vitals and nursing note reviewed.  Constitutional:      General: She is not in acute distress.    Appearance: Normal appearance. She is well-developed.  HENT:     Head: Normocephalic and atraumatic.  Eyes:     General: No scleral icterus.       Right eye: No discharge.        Left eye: No discharge.  Cardiovascular:     Rate and Rhythm: Normal rate and regular rhythm.     Heart sounds: No murmur heard. Pulmonary:     Effort: Pulmonary effort is normal. No respiratory distress.     Breath sounds: Normal breath sounds.  Musculoskeletal:        General: Normal range of motion.     Cervical back: Normal range of motion and neck supple.     Right lower leg: No edema.     Left lower leg: No edema.  Skin:    General: Skin is warm and dry.  Neurological:     Mental Status: She is alert and oriented to person, place, and time.  Psychiatric:        Mood and Affect: Mood normal.        Behavior: Behavior normal.        Thought Content: Thought content normal.        Judgment: Judgment normal.      No results found for any visits on  08/16/23.  Last CBC Lab Results  Component Value Date   WBC 6.8 08/06/2023   HGB 11.8 (L) 08/06/2023   HCT 38.8 08/06/2023   MCV 73.6 (L) 08/06/2023   MCH 22.4 (L) 08/06/2023   RDW 14.9 08/06/2023   PLT 235 08/06/2023   Last metabolic panel Lab Results  Component Value Date   GLUCOSE 127 (H) 08/06/2023   NA 135 08/06/2023   K 4.1 08/06/2023   CL 103 08/06/2023   CO2 23 08/06/2023   BUN 11 08/06/2023   CREATININE 0.98 08/06/2023   GFRNONAA >60 08/06/2023   CALCIUM 8.9 08/06/2023   PROT 6.8 04/16/2023   ALBUMIN 4.0 04/16/2023   BILITOT 0.2 04/16/2023   ALKPHOS 99 04/16/2023   AST 16 04/16/2023   ALT 20 04/16/2023   ANIONGAP 9 08/06/2023  Last lipids Lab Results  Component Value Date   CHOL 137 08/09/2023   HDL 41.80 08/09/2023   LDLCALC 67 08/09/2023   LDLDIRECT 76 07/21/2019   TRIG 144.0 08/09/2023   CHOLHDL 3 08/09/2023   Last hemoglobin A1c Lab Results  Component Value Date   HGBA1C 7.1 (H) 08/09/2023   Last thyroid functions Lab Results  Component Value Date   TSH 2.38 08/02/2023   T4TOTAL 12.3 11/28/2011   Last vitamin D Lab Results  Component Value Date   VD25OH 29.40 (L) 12/01/2021   Last vitamin B12 and Folate Lab Results  Component Value Date   VITAMINB12 268 12/17/2017   FOLATE 10.2 12/17/2017      The 10-year ASCVD risk score (Arnett DK, et al., 2019) is: 19.4%    Assessment & Plan:   Problem List Items Addressed This Visit       Unprioritized   Type 2 diabetes mellitus with hyperglycemia, without long-term current use of insulin (HCC) - Primary   Stop metformin Start farxiga daily and f/u pcp in 3 months  Pt is eating and drinking normally---no N//v/D since Wednesday      Relevant Medications   dapagliflozin propanediol (FARXIGA) 5 MG TABS tablet   Blood Glucose Monitoring Suppl DEVI   Glucose Blood (BLOOD GLUCOSE TEST STRIPS) STRP   Lancet Device MISC   Lancets Misc. MISC  Assessment and Plan    Type 2 Diabetes  Mellitus Type 2 Diabetes Mellitus with elevated A1c. Experienced adverse effects from metformin, including diarrhea, cramping, and vomiting, leading to its discontinuation since Wednesday. Currently asymptomatic. Discussed alternative treatments and chose Farxiga, which was explained along with its mechanism and potential for yeast infections. Emphasized the importance of home blood glucose monitoring and provided glucometer instructions. Discontinue metformin and prescribe Farxiga. Instruct on home blood glucose monitoring with a glucometer. Advise to report frequent yeast infections as a potential side effect of [place]. Schedule follow-up in three months to reassess diabetes management.  General Health Maintenance Discussed preventive care and diabetes management. Provide glucometer, strips, and lances at [facility] pharmacy. Offer additional support from nursing staff or online resources for glucometer use.  Follow-up Schedule follow-up appointment with primary care provider in three months for diabetes management.        No follow-ups on file.    Donato Schultz, DO

## 2023-08-16 NOTE — Telephone Encounter (Signed)
  Chief Complaint: Metformin adverse side effects Symptoms: abdominal cramping, nausea, vomiting, diarrhea, mild lightheaded, dry mouth Frequency: started Tuesday Pertinent Negatives: Patient denies fever, painful urination, blood or dark stools/vomit. Disposition: [] ED /[] Urgent Care (no appt availability in office) / [x] Appointment(In office/virtual)/ []  [facility]/ [] Home Care/ [] Refused Recommended Disposition /[] [place] [facility]/ []  Follow-up with PCP Additional Notes: Patient seen on 08/09/23 for office visit. Patient states she started Metformin on Tuesday and had sudden abdominal cramping, nausea, vomiting and diarrhea that lasted thru Wednesday. She c/o moderate abdominal cramping and nausea still. Patient states she does not have any way to check her blood sugar at home. Patient states she has been able to drink ginger ale yesterday and today and the diarrhea and vomiting subsided yesterday. Advised patient rest and drink water and have someone drive her to the appointment if she feels lightheaded.   Copied from CRM [id number]. Topic: Clinical - Red Word Triage >> Aug 16, 2023 10:36 AM Drema Balzarine wrote: Red Word that prompted transfer to Nurse Triage: Vomiting, diarrhea and cramping from taking Metformin medication Reason for Disposition  [1] MILD-MODERATE pain AND [2] constant AND [3] present > 2 hours  Answer Assessment - Initial Assessment Questions 1. LOCATION: "Where does it hurt?"      Lower abdomen.  2. RADIATION: "Does the pain shoot anywhere else?" (e.g., chest, back)     Denies.  3. ONSET: "When did the pain begin?" (e.g., minutes, hours or days ago)      Began on Tuesday.  4. SUDDEN: "Gradual or sudden onset?"     Sudden.  5. PATTERN "Does the pain come and go, or is it constant?"    - If it comes and goes: "How long does it last?" "Do you have pain now?"     (Note: Comes and goes means the pain is intermittent. It goes away completely between bouts.)     - If constant: "Is it getting better, staying the same, or getting worse?"      (Note: Constant means the pain never goes away completely; most serious pain is constant and gets worse.)      Constant, got a little better since stopped Metformin. 6. SEVERITY: "How bad is the pain?"  (e.g., Scale 1-10; mild, moderate, or severe)    - MILD (1-3): Doesn't interfere with normal activities, abdomen soft and not tender to touch.     - MODERATE (4-7): Interferes with normal activities or awakens from sleep, abdomen tender to touch.     - SEVERE (8-10): Excruciating pain, doubled over, unable to do any normal activities.       5/10, cramping.  7. RECURRENT SYMPTOM: "Have you ever had this type of stomach pain before?" If Yes, ask: "When was the last time?" and "What happened that time?"      Denies.  8. CAUSE: "What do you think is causing the stomach pain?"     Patient states the symptoms began after starting metformin on Tuesday, stopped on Wednesday due to side effects.  9. RELIEVING/AGGRAVATING FACTORS: "What makes it better or worse?" (e.g., antacids, bending or twisting motion, bowel movement)     Denies.  10. OTHER SYMPTOMS: "Do you have any other symptoms?" (e.g., back pain, diarrhea, fever, urination pain, vomiting)       Nausea, several episodes of vomiting (Tuesday and Wednesday), several episodes of diarrhea (Tuesday and Wednesday), back pain  Protocols used: Abdominal Pain - [facility]

## 2023-08-17 ENCOUNTER — Other Ambulatory Visit: Payer: Self-pay | Admitting: Internal Medicine

## 2023-08-17 DIAGNOSIS — M2550 Pain in unspecified joint: Secondary | ICD-10-CM

## 2023-08-19 NOTE — Telephone Encounter (Signed)
 Last Fill: 04/26/2023  Next Visit: 09/27/2023  Last Visit: 04/26/2023  DX: Polyarthralgia   Current Dose per office note on 04/26/2023: duloxetine 30 mg once daily   Okay to refill cymbalta?

## 2023-08-21 ENCOUNTER — Ambulatory Visit (INDEPENDENT_AMBULATORY_CARE_PROVIDER_SITE_OTHER): Payer: 59 | Admitting: Dermatology

## 2023-08-21 ENCOUNTER — Encounter: Payer: Self-pay | Admitting: Dermatology

## 2023-08-21 DIAGNOSIS — L700 Acne vulgaris: Secondary | ICD-10-CM | POA: Diagnosis not present

## 2023-08-21 DIAGNOSIS — B078 Other viral warts: Secondary | ICD-10-CM | POA: Diagnosis not present

## 2023-08-21 DIAGNOSIS — L732 Hidradenitis suppurativa: Secondary | ICD-10-CM | POA: Diagnosis not present

## 2023-08-21 MED ORDER — TRETINOIN 0.025 % EX CREA
TOPICAL_CREAM | Freq: Every day | CUTANEOUS | 2 refills | Status: DC
Start: 2023-08-21 — End: 2024-02-20

## 2023-08-21 NOTE — Progress Notes (Addendum)
 New Patient Visit   Subjective  [name] is a 63 y.o. female who presents for the following: Boils  Patient states she has boils and black heads located at the face that she would like to have examined. Patient reports the areas have been there for several years. She reports the areas are not bothersome. She states the areas will appear then pop. Patient rates irritation 0 out of 10. She states that the areas have not spread. Patient reports she currently only ashes with hot water. She will occasionally wipe with Alcohol wipes. Patient reports unknown triggers. She reports years ago she would have cyst appear in groin area and B/L Axilla.  The patient also reports a lifelong skin condition characterized by rough, wart-like lesions on her hands, under her breasts, and in her ears. She states that these lesions have been present since birth and do not itch or cause any symptoms. The patient mentions being the only one in her family with this condition.  The following portions of the chart were reviewed this encounter and updated as appropriate: medications, allergies, medical history  Review of Systems:  No other skin or systemic complaints except as noted in HPI or Assessment and Plan.  Objective  Well appearing patient in no apparent distress; mood and affect are within normal limits.  A full examination was performed including scalp, head, eyes, ears, nose, lips, neck, chest, axillae, abdomen, back, buttocks, bilateral upper extremities, bilateral lower extremities, hands, feet, fingers, toes, fingernails, and toenails. All findings within normal limits unless otherwise noted below.   A focused examination was performed of the following areas: Face, B/L UE  Relevant exam findings are noted in the Assessment and Plan.                     Assessment & Plan   HIDRADENITIS SUPPURATIVA Exam: Well healed scars at B/L Axilla with Comedonal plugging  Well  Controlled  Hidradenitis Suppurativa is a chronic; persistent; non-curable, but treatable condition due to abnormal inflamed sweat glands in the body folds (axilla, inframammary, groin, medial thighs), causing recurrent painful draining cysts and scarring. It can be associated with severe scarring acne and cysts; also abscesses and scarring of scalp. The goal is control and prevention of flares, as it is not curable. Scars are permanent and can be thickened. Treatment may include daily use of topical medication and oral antibiotics.  Oral isotretinoin may also be helpful.  For some cases, Humira or Cosentyx (biologic injections) may be prescribed to decrease the inflammatory process and prevent flares.  When indicated, inflamed cysts may also be treated surgically.  Epidermodysplasia verruciformis(EDV)  Exam: numerous flat topped verrcoid papules on bilateral dorsal hands/ lower legs, feet, and under breasts  Assessment: Patient presents with long-standing, flat, wart-like lesions on hands, under breasts, and in ears, consistent with epidermodysplasia verruciformis (EDV). This genetic condition follows an autosomal recessive inheritance pattern. The patient is the only affected individual among 8 siblings. The lesions are non-pruritic and have been present since birth. No previous biopsy has been performed.  Plan:   Recommend daily application of La Roche-Posay lotion containing urea for skin softening.   Prescribe tretinoin  0.025% cream for application in ears, followed by Vaseline.   Educate patient on the genetic nature of the condition.   Discuss potential for slight flattening of lesions with consistent treatment over approximately one year.   ACNE VULGARIS Exam: Open comedones and inflammatory papules  Flared  Assessment: Patient presents  with facial blackheads. No previous treatment with prescription retinoids has been attempted.  Treatment Plan: - We will prescribe Tretinoin  0.025%,  For the 1st month apply 2 nights weekly after one month increase to M-W-F nights, cut back usage to 1-2 nights weekly if you experience excessive dryness - Recommended washing 2 times daily with Gentle facial cleansers  ACNE VULGARIS   Related Medications tretinoin  (RETIN-A ) 0.025 % cream Apply topically at bedtime. For the 1st month apply 2 nights weekly after one month increase to M-W-F nights, cut back usage to 1-2 nights weekly if you experience excessive dryness,  Return in about 4 months (around 12/19/2023) for Epidermodysplasia verruciformis(EDV) & Acne F/U.  Documentation: I have reviewed the above documentation for accuracy and completeness, and I agree with the above.  I, Jetta Ager, am acting as scribe for Delon Lenis, DO.  Delon Lenis, DO

## 2023-08-21 NOTE — Patient Instructions (Addendum)

## 2023-08-22 ENCOUNTER — Inpatient Hospital Stay (HOSPITAL_BASED_OUTPATIENT_CLINIC_OR_DEPARTMENT_OTHER): Admission: RE | Admit: 2023-08-22 | Payer: 59 | Source: Ambulatory Visit

## 2023-08-29 ENCOUNTER — Ambulatory Visit (HOSPITAL_BASED_OUTPATIENT_CLINIC_OR_DEPARTMENT_OTHER)
Admission: RE | Admit: 2023-08-29 | Discharge: 2023-08-29 | Disposition: A | Discharge status: Home / Self Care | Payer: 59 | Source: Ambulatory Visit | Attending: Family | Admitting: Family

## 2023-08-29 ENCOUNTER — Encounter (HOSPITAL_BASED_OUTPATIENT_CLINIC_OR_DEPARTMENT_OTHER): Payer: Self-pay

## 2023-08-29 DIAGNOSIS — Z1231 Encounter for screening mammogram for malignant neoplasm of breast: Secondary | ICD-10-CM

## 2023-09-01 NOTE — Progress Notes (Unsigned)
   Cardiology Office Note    Date:  09/01/2023  ID:  [name], DOB 04-30-1961, MRN [id number] PCP:  Sandford Craze, NP  Cardiologist:  Dietrich Pates, MD  Electrophysiologist:  None   Chief Complaint: ***  History of Present Illness: .    [name] is a 63 y.o. female with visit-pertinent history of predominantly nonobstructive CAD, esophageal stricture, DM2, endometriosis, HTN, HLD, thyroid disease, fatty liver, IDA, migraine, borderline dilation of aortic root, liver hemangioma, hiatal hernia, sleep apnea by 2024 sleep study, carotid artery disease by CTA 07/2022 seen for follow-up. She has had periodic evaluations (cath/stress/CT) for chest pain over the years going back to 2009. Last cath 08/2022 showed mild nonobstructive disease in the LAD and Cx, moderate intermediate branch unchanged from 2019 cath, small caliber PDA too small for PCI, managed medically. Cor CTA 08/2022 showed CAC 1504/99%ile, nonobstructive CAD (aortic root calcifications, moderate mixed plaque inprox RCA, mild diffuse m-distal RCA, minimal LAD plaque with short area of mid-distal LAD with myocardial bridging, mild calcification in the prox-mid LAD (?LCx), also incidentally noted circumferential thickening of the esophagus, underlying mass not excluded, similar to a prior CT. Endoscopy 03/2022 for similar previous abnormal CT findings had shown 1cm HH, benign appearing blebs in lower esophagus, dilation of esophagus, mucosal changes of duodenum.Echo 08/2022 showed EF 60-65%, G1DD, normal RV, trivial AI, aortic sclerosis without stenosis, dilated IVC, borderline dilation of aortic root. ER visit 07/2023 for chest pain had negative troponins. CTA head/neck 07/2023 for suspected peripheral dizziness showed atheromatous change about the carotid siphons with associated moderate to severe stenoses bilaterally, aortic atherosclerosis.  Abnormal cor CT Liver hemangioma - sees GI  CAD HTN Hyperlipidemia Sleep  apnea Dilation of aortic root Carotid artery disease  Labwork independently reviewed: 07/2023 LDL 67, trig 144, A1c 7.1, trops neg, Hgb 11.8, plt 235, K 4.1, Cr 0.98, TSH OK 03/2023 LFTs ok  ROS: .    Please see the history of present illness. Otherwise, review of systems is positive for ***.  All other systems are reviewed and otherwise negative.  Studies Reviewed: Marland Kitchen    EKG:  EKG is ordered today, personally reviewed, demonstrating ***  CV Studies: Cardiac studies reviewed are outlined and summarized above. Otherwise please see EMR for full report.   Current Reported Medications:.    No outpatient medications have been marked as taking for the 09/02/23 encounter (Appointment) with Laurann Montana, PA-C.    Physical Exam:    VS:  There were no vitals taken for this visit.   Wt Readings from Last 3 Encounters:  08/16/23 227 lb 6.4 oz (103.1 kg)  08/09/23 232 lb (105.2 kg)  08/06/23 233 lb (105.7 kg)    GEN: Well nourished, well developed in no acute distress NECK: No JVD; No carotid bruits CARDIAC: ***RRR, no murmurs, rubs, gallops RESPIRATORY:  Clear to auscultation without rales, wheezing or rhonchi  ABDOMEN: Soft, non-tender, non-distended EXTREMITIES:  No edema; No acute deformity   Asessement and Plan:.     ***     Disposition: F/u with ***  Signed, Laurann Montana, PA-C

## 2023-09-02 ENCOUNTER — Ambulatory Visit: Attending: Physician Assistant | Admitting: Physician Assistant

## 2023-09-02 ENCOUNTER — Other Ambulatory Visit: Payer: Self-pay | Admitting: *Deleted

## 2023-09-02 ENCOUNTER — Encounter: Payer: Self-pay | Admitting: Physician Assistant

## 2023-09-02 VITALS — BP 148/73 | HR 68 | Ht 64.0 in | Wt 227.4 lb

## 2023-09-02 DIAGNOSIS — I779 Disorder of arteries and arterioles, unspecified: Secondary | ICD-10-CM

## 2023-09-02 DIAGNOSIS — I2510 Atherosclerotic heart disease of native coronary artery without angina pectoris: Secondary | ICD-10-CM

## 2023-09-02 DIAGNOSIS — E785 Hyperlipidemia, unspecified: Secondary | ICD-10-CM | POA: Diagnosis not present

## 2023-09-02 DIAGNOSIS — I77810 Thoracic aortic ectasia: Secondary | ICD-10-CM

## 2023-09-02 DIAGNOSIS — I10 Essential (primary) hypertension: Secondary | ICD-10-CM

## 2023-09-02 MED ORDER — CHLORTHALIDONE 25 MG PO TABS: 25.0000 mg | ORAL_TABLET | Freq: Every day | ORAL | 3 refills | Status: AC

## 2023-09-02 MED ORDER — POTASSIUM CHLORIDE CRYS ER 20 MEQ PO TBCR: 40.0000 meq | EXTENDED_RELEASE_TABLET | Freq: Every day | ORAL | 3 refills | Status: AC

## 2023-09-02 NOTE — Patient Instructions (Addendum)
 Medication Instructions:  Your physician has recommended you make the following change in your medication:   START Chlorthalidone 25 mg taking 1 daily  INCREASE Potassium to 20 meq taking 2 tablets daily  *If you need a refill on your cardiac medications before your next appointment, please call your pharmacy*   Lab Work: 1 WEEK:  GO TO ANY [facility] NEAR YOU FOR:  BMET  [facility] locations:   [facility] - [address] ([address] office) - [address] ([facility]) - [address] [phone]   [place] [facility] [address].    [place]  - [address]    [place] - [address] - [address] [employer]  - [address] - [address] Chief Technology Officer)  If you have labs (blood work) drawn today and your tests are completely normal, you will receive your results only by: [employer] (if you have MyChart) OR A paper copy in the mail If you have any lab test that is abnormal or we need to change your treatment, we will call you to review the results.   Testing/Procedures: Your physician has requested that you have an echocardiogram. Echocardiography is a painless test that uses sound waves to create images of your heart. It provides your doctor with information about the size and shape of your heart and how well your heart's chambers and valves are working. This procedure takes approximately one hour. There are no restrictions for this procedure. Please do NOT wear cologne, perfume, aftershave, or lotions (deodorant is allowed). Please arrive 15 minutes prior to your appointment time.  Please note: We ask at that you not bring children with you during ultrasound (echo/ vascular) testing. Due to room size and safety concerns, children are not allowed in the ultrasound rooms during exams. Our front office staff cannot  provide observation of children in our lobby area while testing is being conducted. An adult accompanying a patient to their appointment will only be allowed in the ultrasound room at the discretion of the ultrasound technician under special circumstances. We apologize for any inconvenience.    Follow-Up: At [facility], you and your health needs are our priority.  As part of our continuing mission to provide you with exceptional heart care, we have created designated Provider Care Teams.  These Care Teams include your primary Cardiologist (physician) and Advanced Practice Providers (APPs -  Physician Assistants and Nurse Practitioners) who all work together to provide you with the care you need, when you need it.  We recommend signing up for the patient portal called "MyChart".  Sign up information is provided on this After Visit Summary.  MyChart is used to connect with patients for Virtual Visits (Telemedicine).  Patients are able to view lab/test results, encounter notes, upcoming appointments, etc.  Non-urgent messages can be sent to your provider as well.   To learn more about what you can do with MyChart, go to [email or web address].    Your next appointment:   4-6 week(s)  Provider:   Ronie Spies, PA-C         Other Instructions     1st Floor: - Lobby - Registration  - Pharmacy  - Lab - Cafe  2nd Floor: - PV Lab - Diagnostic Testing (echo, CT, nuclear med)  3rd Floor: - Vacant  4th Floor: -  TCTS (cardiothoracic surgery) - AFib Clinic - Structural Heart Clinic - Vascular Surgery  - Vascular Ultrasound  5th Floor: - [facility] (general and EP) - Clinical Pharmacy for coumadin, hypertension, lipid, weight-loss medications, and med management appointments    Valet parking services will be available as well.

## 2023-09-05 ENCOUNTER — Telehealth: Payer: Self-pay

## 2023-09-05 NOTE — Telephone Encounter (Signed)
 Copied from CRM [id number]. Topic: General - Other >> Sep 05, 2023  3:10 PM Sim Boast F wrote: Reason for CRM: [employer] request call back at  [phone] reference #[id number] regarding more diagnosis codes for medical necessity for [facility] 04/23/23 for gi profile

## 2023-09-13 ENCOUNTER — Telehealth: Payer: Self-pay | Admitting: Physician Assistant

## 2023-09-13 ENCOUNTER — Other Ambulatory Visit: Payer: Self-pay | Admitting: Internal Medicine

## 2023-09-13 ENCOUNTER — Encounter: Payer: Self-pay | Admitting: Physician Assistant

## 2023-09-13 ENCOUNTER — Other Ambulatory Visit: Payer: 59

## 2023-09-13 DIAGNOSIS — H6993 Unspecified Eustachian tube disorder, bilateral: Secondary | ICD-10-CM | POA: Diagnosis not present

## 2023-09-13 DIAGNOSIS — E0590 Thyrotoxicosis, unspecified without thyrotoxic crisis or storm: Secondary | ICD-10-CM | POA: Diagnosis not present

## 2023-09-13 LAB — T4, FREE: Free T4: 1.1 ng/dL (ref 0.8–1.8)

## 2023-09-13 LAB — TSH: TSH: 1.87 m[IU]/L (ref 0.40–4.50)

## 2023-09-13 LAB — T3, FREE: T3, Free: 3.4 pg/mL (ref 2.3–4.2)

## 2023-09-13 NOTE — Telephone Encounter (Signed)
-----   Message from [place] E sent at 09/02/2023  9:25 AM EDT ----- Regarding: Appointment Patient needs 4-6 week appointment with Dr.Dunn

## 2023-09-13 NOTE — Progress Notes (Deleted)
 Office Visit Note  Patient: [name]             Date of Birth: Oct 15, 1960           MRN: [id number]             PCP: Sandford Craze, NP Referring: Sandford Craze, NP Visit Date: 09/27/2023   Subjective:  No chief complaint on file.   History of Present Illness: [name] is a 63 y.o. female here for follow up with continued joint pain in multiple areas.    Previous HPI 04/26/2023 [name] is a 63 y.o. female here for follow up with continued joint pain in multiple areas.  Lab testing at initial visit showed positive ANA at 1: 80 titer decreased from 1: 320 previously.  Sedimentation rate was still just slightly above normal.  Other antibodies all negative.  Worst area is in her back.  Gets radiation into the legs with prolonged standing this is worse on the right side.  Walking is better and sitting is better compared to standing still.  She has had similar symptoms in the past says this was associated with lumbar disc disease.  Does not see any spine specialist for years.  She has previously gotten good relief with her chiropractor but because pain has been more manageable was not following up with them regularly.     Previous HPI 03/22/23 [name] is a 63 y.o. female here for evaluation of joint pain with positive ANA and ESR.  Medical history is also significant for thyrotoxicosis on treatment with methimazole, esophageal dysphagia and stricture, gastroparesis, and type 2 diabetes.  Some degree of chronic joint pain most prominent in her low back and hips that is not new but symptoms increased in the past year.  She develops area of numbness on the lateral portion of the thigh that occurs after prolonged standing.  Only occasional pain down the back of the leg but she does state very sore in the low back and this gets worse after prolonged walking or standing.  She has morning stiffness lasting about an hour.  Not associated with any significant  new illness or injury.  She does have a previous history of left knee injury that was repaired arthroscopically many years ago and gets some pain with increased use in that area.  Does not take any daily medications for joint pain.  She previously saw orthopedic specialist with back steroid injections that helped for a short duration.  She previously saw physical therapy years ago for back pain but did not feel like the treatment helped. Besides joint problems she does describe chronic eye dryness.  Was not found to have any Graves ophthalmopathy with previous evaluations.  Denies oral or nasal ulcers, Raynaud's syndrome, cervical or axillary lymphadenopathy, or abnormal bruising or bleeding.  She has patchy hyperpigmented spots on the distal arms and legs both sides she described as her birthmark is not new or any recent change. Chronic dysphagia previously seeing Dr. Adela Lank imaging and esophageal manometry consistent with hypercontractile esophagus. She is currently off the methimazole due to running out of medication just recently.  From chart review looks like she was recommended to have repeat thyroid parameters checked in July but these labs were never collected sounds like she was not aware or does not member at this time, has been taking 5 mg once daily refill was requested 9/16.   Labs reviewed ANA 1:80 speckled RF neg ESR 58  No Rheumatology ROS completed.   PMFS History:  Patient Active Problem List   Diagnosis Date Noted   Type 2 diabetes mellitus with hyperglycemia, without long-term current use of insulin (HCC) 08/16/2023   Dysfunction of both eustachian tubes 08/16/2023   Otalgia of both ears 08/10/2023   Cramping of hands 04/16/2023   Positive ANA (antinuclear antibody) 03/22/2023   Sedimentation rate elevation 03/22/2023   Skin cyst 11/02/2022   Arthralgia 11/02/2022   Bulging of lumbar intervertebral disc 09/24/2022   Lumbar radiculopathy 09/24/2022   Left arm pain  08/14/2022   Otitis externa of right ear 06/13/2022   Arthritis 05/11/2022   Edema 01/12/2022   Controlled type 2 diabetes mellitus without complication, without long-term current use of insulin (HCC) 12/01/2021   Facial abscess 09/15/2021   Non-functioning tympanostomy tube 08/25/2021   Cough 08/25/2021   Bilateral impacted cerumen 06/30/2021   Coronary artery disease of native heart with stable angina pectoris (HCC) 06/30/2021   Intertrigo 05/26/2021   DOE (dyspnea on exertion) 12/30/2020   Esophageal dysphagia    Hypercontractile esophagus    Colitis 02/04/2018   Aortic atherosclerosis (HCC) 07/17/2017   Chest pain 07/16/2017   Insomnia 01/12/2017   Breast cyst 06/09/2012   Hyperthyroidism 02/29/2012   Constipation 04/18/2011   Can't get food down 04/18/2011   Ulnar neuropathy 03/30/2011   Low back pain 10/18/2010   Diarrhea 11/01/2009   Fibromyalgia 07/20/2009   Hyperlipidemia 05/26/2009   Vitamin D deficiency 05/23/2009   Polyarthralgia 04/12/2009   Iron deficiency anemia 10/25/2008   GERD 10/25/2008   LIVER HEMANGIOMA 01/01/2008   ESOPHAGEAL STRICTURE 01/01/2008   Gastroparesis 01/01/2008   Anemia 12/08/2007   Allergic rhinitis 12/08/2007   LUQ abdominal pain 09/23/2007   Migraine headache 08/11/2007   HIATAL HERNIA 08/11/2007   OVARIAN CYST 08/11/2007   Essential hypertension 05/06/2007    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/08- benign   Iron deficiency anemia    Migraine headache    Non-compliance     Peptic ulcer disease    Thyroid disease     Family History  Problem Relation Age of Onset   Hypertension Mother    Rheum arthritis Mother    Hypertension Father    Diabetes Father    Prostate cancer Father    Kidney disease Father    Diabetes Sister    Fibromyalgia Sister    Diabetes Maternal Grandmother    Lung cancer Maternal Grandfather    Arthritis Brother    Migraines Brother    CAD Brother    Fibromyalgia Sister    Migraines Sister    Colon cancer Neg Hx    Thyroid disease Neg Hx    Colon polyps Neg Hx    Past Surgical History:  Procedure Laterality Date   ABDOMINAL EXPLORATION SURGERY     w/bso    BREAST BIOPSY     CARDIAC CATHETERIZATION  2009  mild non obstructive CAD   CHOLECYSTECTOMY     COLONOSCOPY     ESOPHAGEAL MANOMETRY N/A 08/01/2020   Procedure: ESOPHAGEAL MANOMETRY (EM);  Surgeon: Benancio Deeds, MD;  Location: [facility];  Service: Gastroenterology;  Laterality: N/A;   KNEE SURGERY  2005    left knee   LEFT HEART CATH AND CORONARY ANGIOGRAPHY N/A 07/17/2017   Procedure: LEFT HEART CATH AND CORONARY ANGIOGRAPHY;  Surgeon: Swaziland, Peter M, MD;  Location: [facility] INVASIVE CV LAB;  Service: Cardiovascular;  Laterality: N/A;   LEFT HEART CATH AND CORONARY ANGIOGRAPHY N/A 09/01/2021   Procedure: LEFT HEART CATH AND CORONARY ANGIOGRAPHY;  Surgeon: Kathleene Hazel, MD;  Location: [facility] INVASIVE CV LAB;  Service: Cardiovascular;  Laterality: N/A;   POLYPECTOMY     TOTAL ABDOMINAL HYSTERECTOMY     UPPER GASTROINTESTINAL ENDOSCOPY     Social History   Social History Narrative   Widowed   Has 2 grown children.  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    Former Smoker Quit tobacco in 1996.  She was a pack a day smoker for approximately 10 years.    Alcohol use-yes: Social    Daily Caffeine Use:6 pack of pepsi daily     Illicit Drug Use - no    Patient does not get regular exercise.       Smoking Status:  quit    Immunization History  Administered Date(s) Administered   Fluad Quad(high Dose 65+) 03/31/2019   Influenza Split 04/30/2011   Influenza Whole 04/19/2006, 04/17/2007, 03/08/2009, 03/01/2010   Influenza, Quadrivalent, Recombinant, Inj, Pf 03/31/2019   Influenza, Seasonal, Injecte, Preservative Fre 05/30/2012   Influenza,inj,Quad PF,6+ Mos 02/15/2014, 03/08/2015, 04/30/2016, 04/24/2017, 04/04/2018, 06/10/2020   Influenza-Unspecified 02/23/2021, 03/10/2023   PFIZER(Purple Top)SARS-COV-2 Vaccination 09/07/2019, 09/29/2019, 05/20/2020   Pfizer Covid-19 Vaccine Bivalent Booster 59yrs & up 02/23/2021   Pneumococcal Conjugate-13 04/04/2018   Pneumococcal Polysaccharide-23 03/08/2009, 09/23/2020   Td 05/17/1999   Tdap 04/30/2011   Zoster Recombinant(Shingrix) 03/31/2019, 08/02/2019     Objective: Vital Signs: There were no vitals taken for this visit.   Physical Exam   Musculoskeletal Exam: ***  CDAI Exam: CDAI Score: -- Patient Global: --; Provider Global: -- Swollen: --; Tender: -- Joint Exam 09/27/2023   No joint exam has been documented for this visit   There is currently no information documented on the homunculus. Go to the Rheumatology activity and complete the homunculus joint exam.  Investigation: No additional findings.  Imaging: MM 3D SCREENING MAMMOGRAM BILATERAL BREAST Result Date: 09/03/2023 CLINICAL DATA:  Screening. EXAM: DIGITAL SCREENING BILATERAL MAMMOGRAM WITH TOMOSYNTHESIS AND CAD TECHNIQUE: Bilateral screening digital craniocaudal and mediolateral oblique mammograms were obtained. Bilateral screening digital breast tomosynthesis was performed. The images were evaluated with computer-aided detection. COMPARISON:  Previous exam(s). ACR Breast Density Category a: The breasts are almost entirely fatty. FINDINGS: There are no findings suspicious for malignancy. IMPRESSION: No mammographic evidence of malignancy. A result letter of this screening mammogram will be  mailed directly to the patient. RECOMMENDATION: Screening mammogram in one year. (Code:SM-B-01Y) BI-RADS CATEGORY  1: Negative. Electronically Signed   By: Amie Portland M.D.   On: 09/03/2023 11:20    Recent Labs: Lab Results  Component Value Date   WBC 6.8 08/06/2023   HGB 11.8 (L) 08/06/2023   PLT 235 08/06/2023   NA 135 08/06/2023   K 4.1 08/06/2023   CL 103 08/06/2023   CO2 23 08/06/2023   GLUCOSE 127 (H)  08/06/2023   BUN 11 08/06/2023   CREATININE 0.98 08/06/2023   BILITOT 0.2 04/16/2023   ALKPHOS 99 04/16/2023   AST 16 04/16/2023   ALT 20 04/16/2023   PROT 6.8 04/16/2023   ALBUMIN 4.0 04/16/2023   CALCIUM 8.9 08/06/2023   GFRAA >60 04/06/2019    Speciality Comments: No specialty comments available.  Procedures:  No procedures performed Allergies: Metformin, Ace inhibitors, Celebrex [celecoxib], and Diovan [valsartan]   Assessment / Plan:     Visit Diagnoses: No diagnosis found.  ***  Orders: No orders of the defined types were placed in this encounter.  No orders of the defined types were placed in this encounter.    Follow-Up Instructions: No follow-ups on file.   Metta Clines, RT  Note - This record has been created using [employer].  Chart creation errors have been sought, but may not always  have been located. Such creation errors do not reflect on  the standard of medical care.

## 2023-09-13 NOTE — Telephone Encounter (Signed)
 LVM 3x to schedule f/u with Ronie Spies for [facility], will send letter

## 2023-09-17 DIAGNOSIS — E785 Hyperlipidemia, unspecified: Secondary | ICD-10-CM | POA: Diagnosis not present

## 2023-09-17 DIAGNOSIS — I10 Essential (primary) hypertension: Secondary | ICD-10-CM | POA: Diagnosis not present

## 2023-09-17 DIAGNOSIS — I2510 Atherosclerotic heart disease of native coronary artery without angina pectoris: Secondary | ICD-10-CM | POA: Diagnosis not present

## 2023-09-17 DIAGNOSIS — I77810 Thoracic aortic ectasia: Secondary | ICD-10-CM | POA: Diagnosis not present

## 2023-09-18 LAB — BASIC METABOLIC PANEL
BUN/Creatinine Ratio: 13 (ref 12–28)
BUN: 11 mg/dL (ref 8–27)
CO2: 21 mmol/L (ref 20–29)
Calcium: 9.7 mg/dL (ref 8.7–10.3)
Chloride: 100 mmol/L (ref 96–106)
Creatinine, Ser: 0.87 mg/dL (ref 0.57–1.00)
Glucose: 129 mg/dL — ABNORMAL HIGH (ref 70–99)
Potassium: 4.1 mmol/L (ref 3.5–5.2)
Sodium: 138 mmol/L (ref 134–144)
eGFR: 74 mL/min/{1.73_m2} (ref >59)

## 2023-09-20 DIAGNOSIS — M5416 Radiculopathy, lumbar region: Secondary | ICD-10-CM | POA: Diagnosis not present

## 2023-09-20 NOTE — Telephone Encounter (Signed)
 Spoke with Toniann Fail at [employer] and provided additional dx codes for unspecified abd pain and loss of appetite.  After review of claim, CSR stated that the claim is already showing a zero balance after and adjustment was made.

## 2023-09-23 ENCOUNTER — Ambulatory Visit (HOSPITAL_COMMUNITY): Attending: Internal Medicine

## 2023-09-23 DIAGNOSIS — R011 Cardiac murmur, unspecified: Secondary | ICD-10-CM

## 2023-09-23 DIAGNOSIS — I10 Essential (primary) hypertension: Secondary | ICD-10-CM | POA: Diagnosis not present

## 2023-09-23 DIAGNOSIS — I77810 Thoracic aortic ectasia: Secondary | ICD-10-CM | POA: Insufficient documentation

## 2023-09-23 LAB — ECHOCARDIOGRAM COMPLETE
Area-P 1/2: 2.55 cm2
S' Lateral: 2.7 cm

## 2023-09-24 ENCOUNTER — Encounter: Payer: Self-pay | Admitting: Family

## 2023-09-27 ENCOUNTER — Ambulatory Visit: Payer: 59 | Admitting: Internal Medicine

## 2023-09-27 ENCOUNTER — Encounter: Payer: Self-pay | Admitting: *Deleted

## 2023-09-27 DIAGNOSIS — H6993 Unspecified Eustachian tube disorder, bilateral: Secondary | ICD-10-CM | POA: Diagnosis not present

## 2023-09-27 DIAGNOSIS — H9011 Conductive hearing loss, unilateral, right ear, with unrestricted hearing on the contralateral side: Secondary | ICD-10-CM | POA: Diagnosis not present

## 2023-09-27 DIAGNOSIS — M2550 Pain in unspecified joint: Secondary | ICD-10-CM

## 2023-10-01 ENCOUNTER — Telehealth: Payer: Self-pay | Admitting: Family

## 2023-10-01 ENCOUNTER — Ambulatory Visit (INDEPENDENT_AMBULATORY_CARE_PROVIDER_SITE_OTHER): Admitting: Family

## 2023-10-01 ENCOUNTER — Encounter: Payer: Self-pay | Admitting: Family

## 2023-10-01 VITALS — BP 117/66 | HR 68 | Temp 99.0°F | Resp 12 | Ht 64.0 in | Wt 228.6 lb

## 2023-10-01 DIAGNOSIS — H6993 Unspecified Eustachian tube disorder, bilateral: Secondary | ICD-10-CM

## 2023-10-01 DIAGNOSIS — R42 Dizziness and giddiness: Secondary | ICD-10-CM | POA: Diagnosis not present

## 2023-10-01 MED ORDER — MECLIZINE HCL 12.5 MG PO TABS: 12.5000 mg | ORAL_TABLET | Freq: Three times a day (TID) | ORAL | 0 refills | Status: AC | PRN

## 2023-10-01 NOTE — Telephone Encounter (Signed)
 Copied from CRM [id number]. Topic: Clinical - Medical Advice >> Sep 30, 2023  2:38 PM Melissa C wrote: Reason for CRM: patient has blockage in  her ear and still has dizzy spells wanted to talk to doctor or clinical team

## 2023-10-01 NOTE — Telephone Encounter (Signed)
 Please call Elmer Sow to request copy of DM eye exam.

## 2023-10-01 NOTE — Progress Notes (Unsigned)
 Subjective:     Patient ID: [name], female    DOB: September 10, 1960, 63 y.o.   MRN: [id number]  Chief Complaint  Patient presents with   Dizziness    For one month- went to ent and they are sending to surgeon cause she looks like she got some blockage    HPI  Discussed the use of AI scribe software for clinical note transcription with the patient, who gave verbal consent to proceed.  History of Present Illness  [name] is a 63 year old female who presents with dizziness. She was referred by an ear, nose, and throat specialist for evaluation of inner ear fluid drainage issues.  She experiences dizziness, which she believes is related to inner ear fluid retention. The dizziness has remained about the same and is exacerbated by head movements and standing. She has been using meclizine, which provides some relief, and she requires a refill. An ear, nose, and throat specialist noted poor drainage of her inner ear fluid and recommended continuing Flonase and allergy medication.  Her diabetes management is overseen by Dr. Elvera Lennox who recently adjusted her metformin dosage. Her A1c was last checked in February and was 7.1. She attributes this to having consumed juice before the test, although she is aware that this reflects a ninety-day average.  Her blood pressure is reported to be good, and she is up to date on her eye exams, which are conducted at [facility]. Lab Results  Component Value Date   HGBA1C 7.1 (H) 08/09/2023        Health Maintenance Due  Topic Date Due   OPHTHALMOLOGY EXAM  Never done   FOOT EXAM  05/30/2013   DTaP/Tdap/Td (3 - Td or Tdap) 04/29/2021   COVID-19 Vaccine (5 - 2024-25 season) 02/24/2023    Past Medical History:  Diagnosis Date   Allergy    allergic rhinitis   Arthritis    Atypical chest pain    Constipation    Cyst, ovarian    Diabetes mellitus, type II (HCC)    Elevated alkaline phosphatase level    Endometriosis     Esophageal stricture    Fatty liver    Gastroparesis    GERD (gastroesophageal reflux disease)    Heart murmur    Hepatic hemangioma    Hyperlipidemia    Hypertension    Hypertrophic condition of skin    acrokeratoelastoidosis- s/p 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 1/08- benign   Iron deficiency anemia    Migraine headache    Non-compliance    Peptic ulcer disease    Thyroid disease     Past Surgical History:  Procedure Laterality Date   ABDOMINAL EXPLORATION SURGERY     w/bso    BREAST BIOPSY     CARDIAC CATHETERIZATION  2009   mild non obstructive CAD   CHOLECYSTECTOMY     COLONOSCOPY     ESOPHAGEAL MANOMETRY N/A 08/01/2020   Procedure: ESOPHAGEAL MANOMETRY (EM);  Surgeon: Benancio Deeds, MD;  Location: [facility];  Service: Gastroenterology;  Laterality: N/A;   KNEE SURGERY  2005    left knee   LEFT HEART CATH AND CORONARY ANGIOGRAPHY N/A 07/17/2017   Procedure: LEFT HEART CATH AND CORONARY ANGIOGRAPHY;  Surgeon: Swaziland, Peter M, MD;  Location: [facility] INVASIVE CV LAB;  Service: Cardiovascular;  Laterality: N/A;   LEFT HEART CATH AND CORONARY ANGIOGRAPHY N/A 09/01/2021   Procedure: LEFT HEART CATH AND CORONARY ANGIOGRAPHY;  Surgeon: Kathleene Hazel, MD;  Location: [facility] INVASIVE  CV LAB;  Service: Cardiovascular;  Laterality: N/A;   POLYPECTOMY     TOTAL ABDOMINAL HYSTERECTOMY     UPPER GASTROINTESTINAL ENDOSCOPY      Family History  Problem Relation Age of Onset   Hypertension Mother    Rheum arthritis Mother    Hypertension Father    Diabetes Father    Prostate cancer Father    Kidney disease Father    Diabetes Sister    Fibromyalgia Sister    Diabetes Maternal Grandmother    Lung cancer Maternal Grandfather    Arthritis Brother    Migraines Brother    CAD Brother    Fibromyalgia Sister    Migraines Sister    Colon cancer Neg Hx    Thyroid disease Neg Hx    Colon polyps Neg Hx     Social History   Socioeconomic History   Marital status: Widowed    Spouse name:  Not on file   Number of children: 2   Years of education: Not on file   Highest education level: Not on file  Occupational History   Occupation: DISABILITY    Employer: UNEMPLOYED  Tobacco Use   Smoking status: Former    Current packs/day: 0.00    Average packs/day: 0.5 packs/day for 18.0 years (9.0 ttl pk-yrs)    Types: Cigarettes    Start date: 07/29/1977    Quit date: 06/25/1994    Years since quitting: 29.2   Smokeless tobacco: Never   Tobacco comments:    quit 19 years ago  Vaping Use   Vaping status: Never Used  Substance and Sexual Activity   Alcohol use: Yes    Alcohol/week: 0.0 standard drinks of alcohol    Comment: social drinker   Drug use: No   Sexual activity: Not Currently  Other Topics Concern   Not on file  Social History Narrative   Widowed   Has 2 grown children.  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    Former Smoker Quit tobacco in 1996.  She was a pack a day smoker for approximately 10 years.    Alcohol use-yes: Social    Daily Caffeine Use:6 pack of pepsi daily     Illicit Drug Use - no    Patient does not get regular exercise.       Smoking Status:  quit   Social Drivers of Health   Financial Resource Strain: Low Risk  (01/16/2023)   Overall Financial Resource Strain (CARDIA)    Difficulty of Paying Living Expenses: Not hard at all  Food Insecurity: No Food Insecurity (01/16/2023)   Hunger Vital Sign    Worried About Running Out of Food in the Last Year: Never true    Ran Out of Food in the Last Year: Never true  Transportation Needs: No Transportation Needs (01/16/2023)   PRAPARE - Administrator, Civil Service (Medical): No    Lack of Transportation (Non-Medical): No  Physical Activity: Inactive (06/10/2023)   Exercise Vital Sign    Days of Exercise per Week: 0 days    Minutes of Exercise per Session: 0 min  Stress: No Stress Concern Present (01/16/2023)   [employer] of Occupational  Health - Occupational Stress Questionnaire    Feeling of Stress : Not at all  Social Connections: Moderately Isolated (06/10/2023)   Social Connection and Isolation Panel [NHANES]    Frequency of Communication with Friends and Family: More than three times a  week    Frequency of Social Gatherings with Friends and Family: More than three times a week    Attends Religious Services: More than 4 times per year    Active Member of [employer] or Organizations: No    Attends Banker Meetings: Never    Marital Status: Widowed  Intimate Partner Violence: Not At Risk (06/08/2022)   Humiliation, Afraid, Rape, and Kick questionnaire    Fear of Current or Ex-Partner: No    Emotionally Abused: No    Physically Abused: No    Sexually Abused: No    Outpatient Medications Prior to Visit  Medication Sig Dispense Refill   Accu-Chek Softclix Lancets lancets 3 (three) times daily.     albuterol (VENTOLIN HFA) 108 (90 Base) MCG/ACT inhaler INHALE 2 PUFFS BY MOUTH 4 TIMES DAILY 9 g 0   alclomethasone (ACLOVATE) 0.05 % cream Apply topically 2 (two) times daily as needed (Rash). 180 g 3   amLODipine (NORVASC) 10 MG tablet Take 1 tablet by mouth once daily 90 tablet 0   aspirin 81 MG chewable tablet Chew 1 tablet (81 mg total) by mouth daily.     Blood Glucose Monitoring Suppl DEVI 1 each by Does not apply route in the morning, at noon, and at bedtime. May substitute to any manufacturer covered by patient's insurance. 1 each 0   chlorthalidone (HYGROTON) 25 MG tablet Take 1 tablet (25 mg total) by mouth daily. 90 tablet 3   clobetasol (TEMOVATE) 0.05 % external solution Apply 1 application topically 2 (two) times daily. 50 mL 6   clotrimazole-betamethasone (LOTRISONE) cream Apply 1 application topically 2 (two) times daily. 30 g 0   dapagliflozin propanediol (FARXIGA) 5 MG TABS tablet Take 1 tablet (5 mg total) by mouth daily before breakfast. 30 tablet 2   diclofenac Sodium (VOLTAREN) 1 % GEL Apply 4 g  topically 4 (four) times daily. 100 g 0   DULoxetine (CYMBALTA) 30 MG capsule Take 1 capsule by mouth once daily 30 capsule 1   Evolocumab (REPATHA SURECLICK) 140 MG/ML SOAJ INJECT 140 MG SUBCUTANEOUSLY EVERY 14 DAYS 6 mL 0   fluconazole (DIFLUCAN) 150 MG tablet Take 1 tablet by mouth now- may repeat in 3 days if needed 2 tablet 0   fluticasone (FLONASE) 50 MCG/ACT nasal spray Place 2 sprays into both nostrils daily. 16 g 1   furosemide (LASIX) 20 MG tablet Take 1 tablet (20 mg total) by mouth daily as needed (for shortness of breath or weight gain of 2 lbs overnight or 5 lbs in a week). 45 tablet 3   hyoscyamine (LEVSIN) 0.125 MG tablet Take 1 tablet (0.125 mg total) by mouth every 4 (four) hours as needed for cramping (diarrhea,nausea). 90 tablet 1   isosorbide mononitrate (IMDUR) 60 MG 24 hr tablet TAKE 1 & 1/2 (ONE & ONE-HALF) TABLETS BY MOUTH ONCE DAILY 135 tablet 3   loratadine (CLARITIN) 10 MG tablet Take 1 tablet (10 mg total) by mouth daily. 10 tablet 0   methimazole (TAPAZOLE) 5 MG tablet Take 1 tablet by mouth every other day 45 tablet 3   methocarbamol (ROBAXIN) 500 MG tablet Take 1 tablet (500 mg total) by mouth every 8 (eight) hours as needed for muscle spasms. 30 tablet 0   metoCLOPramide (REGLAN) 5 MG tablet Take 1 tablet (5 mg total) by mouth 3 (three) times daily before meals. Schedule with GI for further refills 90 tablet 0   ondansetron (ZOFRAN) 4 MG tablet Take 1 tab by  mouth every 6 hours as needed for nausea. 100 tablet 1   ondansetron (ZOFRAN-ODT) 8 MG disintegrating tablet Take 8 mg by mouth every 8 (eight) hours as needed for vomiting or nausea.     pantoprazole (PROTONIX) 40 MG tablet Take 1 tablet (40 mg total) by mouth daily. 30 tablet 5   potassium chloride SA (KLOR-CON M20) 20 MEQ tablet Take 2 tablets (40 mEq total) by mouth daily. 180 tablet 3   rosuvastatin (CRESTOR) 40 MG tablet Take 1 tablet by mouth once daily 90 tablet 0   tretinoin (RETIN-A) 0.025 % cream Apply  topically at bedtime. For the 1st month apply 2 nights weekly after one month increase to M-W-F nights, cut back usage to 1-2 nights weekly if you experience excessive dryness, 45 g 2   XIIDRA 5 % SOLN Apply 1 drop to eye 2 (two) times daily.     meclizine (ANTIVERT) 12.5 MG tablet Take 1 tablet (12.5 mg total) by mouth 3 (three) times daily as needed for dizziness. 30 tablet 0   No facility-administered medications prior to visit.    Allergies  Allergen Reactions   Metformin Anxiety    Vomiting, Anxiety, Cramping, Diarrhea   Ace Inhibitors Cough   Celebrex [Celecoxib] Other (See Comments)    "makes me bleed"   Diovan [Valsartan]     angioedema    ROS See HPI    Objective:    Physical Exam Constitutional:      General: She is not in acute distress.    Appearance: Normal appearance. She is well-developed.  HENT:     Head: Normocephalic and atraumatic.     Right Ear: External ear normal.     Left Ear: External ear normal.  Eyes:     General: No scleral icterus. Neck:     Thyroid: No thyromegaly.  Cardiovascular:     Rate and Rhythm: Normal rate and regular rhythm.     Heart sounds: Normal heart sounds. No murmur heard. Pulmonary:     Effort: Pulmonary effort is normal. No respiratory distress.     Breath sounds: Normal breath sounds. No wheezing.  Musculoskeletal:     Cervical back: Neck supple.  Skin:    General: Skin is warm and dry.  Neurological:     Mental Status: She is alert and oriented to person, place, and time.  Psychiatric:        Mood and Affect: Mood normal.        Behavior: Behavior normal.        Thought Content: Thought content normal.        Judgment: Judgment normal.      BP 117/66 (BP Location: Right Arm, Patient Position: Sitting, Cuff Size: Normal)   Pulse 68   Temp 99 F (37.2 C) (Oral)   Resp 12   Ht 5\' 4"  (1.626 m)   Wt 228 lb 9.6 oz (103.7 kg)   SpO2 100%   BMI 39.24 kg/m  Wt Readings from Last 3 Encounters:  10/01/23 228 lb  9.6 oz (103.7 kg)  09/02/23 227 lb 6.4 oz (103.1 kg)  08/16/23 227 lb 6.4 oz (103.1 kg)       Assessment & Plan:   Problem List Items Addressed This Visit       Unprioritized   Dysfunction of both eustachian tubes   Dizziness due to inner ear fluid retention with poor drainage. Expected improvement with tube placement. - Continue Flonase and allergy medication. - Follow up with Dr. Pollyann Kennedy  for tube placement consideration. - Refill meclizine prescription. - Report persistent dizziness post-tube placement.       Other Visit Diagnoses       Dizziness    -  Primary   Relevant Medications   meclizine (ANTIVERT) 12.5 MG tablet       I am having [name] maintain her aspirin, ondansetron, loratadine, fluticasone, alclomethasone, clobetasol, clotrimazole-betamethasone, albuterol, methocarbamol, hyoscyamine, Xiidra, ondansetron, furosemide, isosorbide mononitrate, rosuvastatin, metoCLOPramide, pantoprazole, amLODipine, Repatha SureClick, fluconazole, methimazole, diclofenac Sodium, dapagliflozin propanediol, Blood Glucose Monitoring Suppl, DULoxetine, Accu-Chek Softclix Lancets, tretinoin, chlorthalidone, potassium chloride SA, and meclizine.  Meds ordered this encounter  Medications   meclizine (ANTIVERT) 12.5 MG tablet    Sig: Take 1 tablet (12.5 mg total) by mouth 3 (three) times daily as needed for dizziness.    Dispense:  30 tablet    Refill:  0    Supervising Provider:   Danise Edge A [[id number]]

## 2023-10-02 NOTE — Patient Instructions (Signed)
 VISIT SUMMARY:  Today, you were seen for dizziness, which you believe is related to inner ear fluid retention. You were referred by an ear, nose, and throat specialist for this issue. We also reviewed your diabetes management and general health maintenance.  YOUR PLAN:  -INNER EAR FLUID RETENTION: You have dizziness due to fluid retention in your inner ear, which is not draining well. This can cause balance issues and dizziness, especially with head movements and standing. You should continue using Flonase and your allergy medication as recommended. We will refill your meclizine prescription to help manage your symptoms. You should follow up with Dr. Pollyann Kennedy to discuss the possibility of placing tubes in your ears to improve drainage. If you continue to experience dizziness after the tube placement, please report it.  -TYPE 2 DIABETES MELLITUS: Your diabetes is being managed by Dr. Renaldo Reel. Your last A1c test, which measures your average blood sugar levels over the past three months, was 7.1%. This was likely higher due to recent juice consumption. We will recheck your A1c in two months to ensure your blood sugar levels are well-controlled.  -GENERAL HEALTH MAINTENANCE: Your blood pressure is well-controlled, and your eye exams are up to date. We will obtain a report from [facility] to keep your records current.  INSTRUCTIONS:  Please follow up with Dr. Pollyann Kennedy to discuss tube placement for your inner ear fluid retention. We will recheck your A1c in two months to monitor your diabetes. Continue taking your medications as prescribed and report any persistent dizziness after tube placement.

## 2023-10-02 NOTE — Assessment & Plan Note (Signed)
 Dizziness due to inner ear fluid retention with poor drainage. Expected improvement with tube placement. - Continue Flonase and allergy medication. - Follow up with Dr. Pollyann Kennedy for tube placement consideration. - Refill meclizine prescription. - Report persistent dizziness post-tube placement.

## 2023-10-02 NOTE — Telephone Encounter (Signed)
 Electronic request made

## 2023-10-04 ENCOUNTER — Encounter: Payer: Self-pay | Admitting: Internal Medicine

## 2023-10-04 ENCOUNTER — Ambulatory Visit: Attending: Internal Medicine | Admitting: Internal Medicine

## 2023-10-04 ENCOUNTER — Telehealth: Payer: Self-pay | Admitting: Internal Medicine

## 2023-10-04 VITALS — BP 121/75 | HR 76 | Resp 16 | Ht 64.0 in | Wt 226.0 lb

## 2023-10-04 DIAGNOSIS — R7689 Other specified abnormal immunological findings in serum: Secondary | ICD-10-CM

## 2023-10-04 DIAGNOSIS — M51369 Other intervertebral disc degeneration, lumbar region without mention of lumbar back pain or lower extremity pain: Secondary | ICD-10-CM

## 2023-10-04 DIAGNOSIS — M2550 Pain in unspecified joint: Secondary | ICD-10-CM | POA: Diagnosis not present

## 2023-10-04 DIAGNOSIS — R768 Other specified abnormal immunological findings in serum: Secondary | ICD-10-CM

## 2023-10-04 DIAGNOSIS — M1712 Unilateral primary osteoarthritis, left knee: Secondary | ICD-10-CM | POA: Diagnosis not present

## 2023-10-04 DIAGNOSIS — M797 Fibromyalgia: Secondary | ICD-10-CM | POA: Diagnosis not present

## 2023-10-04 DIAGNOSIS — R700 Elevated erythrocyte sedimentation rate: Secondary | ICD-10-CM

## 2023-10-04 MED ORDER — LIDOCAINE HCL 1 % IJ SOLN
2.0000 mL | INTRAMUSCULAR | Status: AC | PRN
  Administered 2023-10-04: 2 mL

## 2023-10-04 MED ORDER — DULOXETINE HCL 60 MG PO CPEP: 60.0000 mg | ORAL_CAPSULE | Freq: Every day | ORAL | 2 refills | Status: DC

## 2023-10-04 MED ORDER — TRIAMCINOLONE ACETONIDE 40 MG/ML IJ SUSP
40.0000 mg | INTRAMUSCULAR | Status: AC | PRN
  Administered 2023-10-04: 40 mg via INTRA_ARTICULAR

## 2023-10-04 NOTE — Progress Notes (Signed)
 Office Visit Note  Patient: [name]             Date of Birth: [birth year]           MRN: [id number]             PCP: Sandford Craze, NP Referring: Sandford Craze, NP Visit Date: 10/04/2023   Subjective:  Follow-up (Patient states she has a lot of aching. )    Discussed the use of AI scribe software for clinical note transcription with the patient, who gave verbal consent to proceed.  History of Present Illness   [name] is a 63 year old female with osteoarthritis and possible inflammatory arthritis.  She has been experiencing persistent aches and pains for several years, with stiffness and pain in multiple areas, including her back. Duloxetine (Cymbalta) was previously prescribed but did not significantly improve her symptoms, and she experienced no notable side effects. Physical therapy for her back over the past two months has not been beneficial. Previous injections in her back are currently avoided due to potential interactions with her diabetes medication.  She experiences swelling and soreness in her knees, particularly the right knee, which often wakes her at night. Despite attending physical therapy, she feels it has not been beneficial. A history of elevated inflammatory markers was noted in blood tests a few months ago, but no diagnosis of lupus or other specific autoimmune conditions has been made. She continues to experience pain, particularly on the right side of her body. No recent skin changes, rashes, or upper respiratory infections.  She experiences tingling in her fingers and dizziness, which she attributes to ear issues. She is scheduled to see a surgeon on May 1st for fluid buildup in her ear and has been prescribed medication for dizziness.  Additionally, she reports cramps that extend from her head to her arm intermittently.    04/26/23 [name] is a 63 y.o. female here for follow up with continued joint pain in multiple areas.   Lab testing at initial visit showed positive ANA at 1: 80 titer decreased from 1: 320 previously.  Sedimentation rate was still just slightly above normal.  Other antibodies all negative.  Worst area is in her back.  Gets radiation into the legs with prolonged standing this is worse on the right side.  Walking is better and sitting is better compared to standing still.  She has had similar symptoms in the past says this was associated with lumbar disc disease.  Does not see any spine specialist for years.  She has previously gotten good relief with her chiropractor but because pain has been more manageable was not following up with them regularly.   03/22/23 [name] is a 63 y.o. female here for evaluation of joint pain with positive ANA and ESR.  Medical history is also significant for thyrotoxicosis on treatment with methimazole, esophageal dysphagia and stricture, gastroparesis, and type 2 diabetes.  Some degree of chronic joint pain most prominent in her low back and hips that is not new but symptoms increased in the past year.  She develops area of numbness on the lateral portion of the thigh that occurs after prolonged standing.  Only occasional pain down the back of the leg but she does state very sore in the low back and this gets worse after prolonged walking or standing.  She has morning stiffness lasting about an hour.  Not associated with any significant new illness or injury.  She  does have a previous history of left knee injury that was repaired arthroscopically many years ago and gets some pain with increased use in that area.  Does not take any daily medications for joint pain.  She previously saw orthopedic specialist with back steroid injections that helped for a short duration.  She previously saw physical therapy years ago for back pain but did not feel like the treatment helped. Besides joint problems she does describe chronic eye dryness.  Was not found to have any Graves  ophthalmopathy with previous evaluations.  Denies oral or nasal ulcers, Raynaud's syndrome, cervical or axillary lymphadenopathy, or abnormal bruising or bleeding.  She has patchy hyperpigmented spots on the distal arms and legs both sides she described as her birthmark is not new or any recent change. Chronic dysphagia previously seeing Dr. Adela Lank imaging and esophageal manometry consistent with hypercontractile esophagus. She is currently off the methimazole due to running out of medication just recently.  From chart review looks like she was recommended to have repeat thyroid parameters checked in July but these labs were never collected sounds like she was not aware or does not member at this time, has been taking 5 mg once daily refill was requested 9/16.   Labs reviewed ANA 1:80 speckled RF neg ESR 58   Review of Systems  Constitutional:  Negative for fatigue.  HENT:  Negative for mouth sores and mouth dryness.   Eyes:  Negative for dryness.  Respiratory:  Positive for shortness of breath.   Cardiovascular:  Negative for chest pain and palpitations.  Gastrointestinal:  Negative for blood in stool, constipation and diarrhea.  Endocrine: Negative for increased urination.  Genitourinary:  Positive for involuntary urination.  Musculoskeletal:  Positive for joint pain, joint pain, joint swelling, myalgias, muscle weakness, morning stiffness, muscle tenderness and myalgias. Negative for gait problem.  Skin:  Negative for color change, rash, hair loss and sensitivity to sunlight.  Allergic/Immunologic: Negative for susceptible to infections.  Neurological:  Negative for dizziness and headaches.  Hematological:  Negative for swollen glands.  Psychiatric/Behavioral:  Negative for depressed mood and sleep disturbance. The patient is not nervous/anxious.     PMFS History:  Patient Active Problem List   Diagnosis Date Noted   Unilateral primary osteoarthritis, left knee 10/04/2023   Type  2 diabetes mellitus with hyperglycemia, without long-term current use of insulin (HCC) 08/16/2023   Dysfunction of both eustachian tubes 08/16/2023   Otalgia of both ears 08/10/2023   Cramping of hands 04/16/2023   Positive ANA (antinuclear antibody) 03/22/2023   Sedimentation rate elevation 03/22/2023   Skin cyst 11/02/2022   Arthralgia 11/02/2022   Bulging of lumbar intervertebral disc 09/24/2022   Lumbar radiculopathy 09/24/2022   Left arm pain 08/14/2022   Otitis externa of right ear 06/13/2022   Arthritis 05/11/2022   Edema 01/12/2022   Controlled type 2 diabetes mellitus without complication, without long-term current use of insulin (HCC) 12/01/2021   Facial abscess 09/15/2021   Non-functioning tympanostomy tube 08/25/2021   Cough 08/25/2021   Bilateral impacted cerumen 06/30/2021   Coronary artery disease of native heart with stable angina pectoris (HCC) 06/30/2021   Intertrigo 05/26/2021   DOE (dyspnea on exertion) 12/30/2020   Esophageal dysphagia    Hypercontractile esophagus    Colitis 02/04/2018   Aortic atherosclerosis (HCC) 07/17/2017   Chest pain 07/16/2017   Insomnia 01/12/2017   Breast cyst 06/09/2012   Hyperthyroidism 02/29/2012   Constipation 04/18/2011   Can't get food down 04/18/2011  Ulnar neuropathy 03/30/2011   Low back pain 10/18/2010   Diarrhea 11/01/2009   Fibromyalgia 07/20/2009   Hyperlipidemia 05/26/2009   Vitamin D deficiency 05/23/2009   Polyarthralgia 04/12/2009   Iron deficiency anemia 10/25/2008   GERD 10/25/2008   LIVER HEMANGIOMA 01/01/2008   ESOPHAGEAL STRICTURE 01/01/2008   Gastroparesis 01/01/2008   Anemia 12/08/2007   Allergic rhinitis 12/08/2007   LUQ abdominal pain 09/23/2007   Migraine headache 08/11/2007   HIATAL HERNIA 08/11/2007   OVARIAN CYST 08/11/2007   Essential hypertension 05/06/2007    Past Medical History:  Diagnosis Date   Allergy    allergic rhinitis   Arthritis    Atypical chest pain    Constipation     Cyst, ovarian    Diabetes (HCC)    Diabetes mellitus, type II (HCC)    Elevated alkaline phosphatase level    Endometriosis    Esophageal stricture    Fatty liver    Gastroparesis    GERD (gastroesophageal reflux disease)    Heart murmur    Hepatic hemangioma    Hyperlipidemia    Hypertension    Hypertrophic condition of skin    acrokeratoelastoidosis- s/p 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 1/08- benign   Iron deficiency anemia    Migraine headache    Non-compliance    Peptic ulcer disease    Thyroid disease     Family History  Problem Relation Age of Onset   Hypertension Mother    Rheum arthritis Mother    Hypertension Father    Diabetes Father    Prostate cancer Father    Kidney disease Father    Diabetes Sister    Fibromyalgia Sister    Diabetes Maternal Grandmother    Lung cancer Maternal Grandfather    Arthritis Brother    Migraines Brother    CAD Brother    Fibromyalgia Sister    Migraines Sister    Colon cancer Neg Hx    Thyroid disease Neg Hx    Colon polyps Neg Hx    Past Surgical History:  Procedure Laterality Date   ABDOMINAL EXPLORATION SURGERY     w/bso    BREAST BIOPSY     CARDIAC CATHETERIZATION  2009   mild non obstructive CAD   CHOLECYSTECTOMY     COLONOSCOPY     ESOPHAGEAL MANOMETRY N/A 08/01/2020   Procedure: ESOPHAGEAL MANOMETRY (EM);  Surgeon: Benancio Deeds, MD;  Location: [facility];  Service: Gastroenterology;  Laterality: N/A;   KNEE SURGERY  2005    left knee   LEFT HEART CATH AND CORONARY ANGIOGRAPHY N/A 07/17/2017   Procedure: LEFT HEART CATH AND CORONARY ANGIOGRAPHY;  Surgeon: [place], Peter M, MD;  Location: [facility] INVASIVE CV LAB;  Service: Cardiovascular;  Laterality: N/A;   LEFT HEART CATH AND CORONARY ANGIOGRAPHY N/A 09/01/2021   Procedure: LEFT HEART CATH AND CORONARY ANGIOGRAPHY;  Surgeon: Kathleene Hazel, MD;  Location: [facility] INVASIVE CV LAB;  Service: Cardiovascular;  Laterality: N/A;   POLYPECTOMY     TOTAL ABDOMINAL HYSTERECTOMY      UPPER GASTROINTESTINAL ENDOSCOPY     Social History   Social History Narrative   Widowed   Has 2 grown children.  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    Former Smoker Quit tobacco in 1996.  She was a pack a day smoker for approximately 10 years.    Alcohol use-yes: Social    Daily Caffeine Use:6 pack of pepsi daily     Illicit Drug  Use - no    Patient does not get regular exercise.       Smoking Status:  quit   Immunization History  Administered Date(s) Administered   Fluad Quad(high Dose 65+) 03/31/2019   Influenza Split 04/30/2011   Influenza Whole 04/19/2006, 04/17/2007, 03/08/2009, 03/01/2010   Influenza, Quadrivalent, Recombinant, Inj, Pf 03/31/2019   Influenza, Seasonal, Injecte, Preservative Fre 05/30/2012   Influenza,inj,Quad PF,6+ Mos 02/15/2014, 03/08/2015, 04/30/2016, 04/24/2017, 04/04/2018, 06/10/2020   Influenza-Unspecified 02/23/2021, 03/10/2023   PFIZER(Purple Top)SARS-COV-2 Vaccination 09/07/2019, 09/29/2019, 05/20/2020   Pfizer Covid-19 Vaccine Bivalent Booster 80yrs & up 02/23/2021   Pneumococcal Conjugate-13 04/04/2018   Pneumococcal Polysaccharide-23 03/08/2009, 09/23/2020   Td 05/17/1999   Tdap 04/30/2011   Zoster Recombinant(Shingrix) 03/31/2019, 08/02/2019     Objective: Vital Signs: BP 121/75 (BP Location: Left Arm, Patient Position: Sitting, Cuff Size: Large)   Pulse 76   Resp 16   Ht 5\' 4"  (1.626 m)   Wt 226 lb (102.5 kg)   BMI 38.79 kg/m    Physical Exam Eyes:     Conjunctiva/sclera: Conjunctivae normal.  Cardiovascular:     Rate and Rhythm: Normal rate and regular rhythm.  Pulmonary:     Effort: Pulmonary effort is normal.     Breath sounds: Normal breath sounds.  Skin:    General: Skin is warm and dry.     Comments: Firm slightly raised 2-3 cm flesh-colored or slightly hypopigmented nodules throughout especially distal extremities  Neurological:     Mental Status: She is alert.   Psychiatric:        Mood and Affect: Mood normal.      Musculoskeletal Exam:  Shoulders full ROM no tenderness or swelling Elbows full ROM no tenderness or swelling Wrists full ROM no tenderness or swelling Fingers full ROM no tenderness or swelling Low back midline and paraspinal tenderness much more right than left Mild right lateral hip tenderness to pressure Left knee anterior joint line tenderness to pressure, no palpable effusion  Investigation: No additional findings.  Imaging: ECHOCARDIOGRAM COMPLETE Result Date: 09/23/2023    ECHOCARDIOGRAM REPORT   Patient Name:   JHADE BERKO Date of Exam: 09/23/2023 Medical Rec #:  [id number]        Height:       64.0 in Accession #:    [id number]       Weight:       227.4 lb Date of Birth:  March 22, 1961        BSA:          2.066 m Patient Age:    63 years         BP:           148/73 mmHg Patient Gender: F                HR:           70 bpm. Exam Location:  [address] Procedure: 2D Echo, Cardiac Doppler and Color Doppler (Both Spectral and Color            Flow Doppler were utilized during procedure). Indications:    I77.810 Dilated Aorta Root  History:        Patient has prior history of Echocardiogram examinations, most                 recent 09/21/2022. Signs/Symptoms:Murmur; Risk                 Factors:Hypertension and Diabetes.  Sonographer:    NaTashia Rodgers-Jones  RDCS Referring Phys: [id number] DAYNA N DUNN IMPRESSIONS  1. Left ventricular ejection fraction, by estimation, is 65 to 70%. Left ventricular ejection fraction by PLAX is 67 %. The left ventricle has normal function. The left ventricle has no regional wall motion abnormalities. Left ventricular diastolic parameters are consistent with Grade I diastolic dysfunction (impaired relaxation).  2. Right ventricular systolic function is normal. The right ventricular size is normal.  3. The mitral valve is grossly normal. No evidence of mitral valve regurgitation.  4. The aortic valve is  tricuspid. Aortic valve regurgitation is trivial. Aortic valve sclerosis/calcification is present, without any evidence of aortic stenosis.  5. The inferior vena cava is normal in size with greater than 50% respiratory variability, suggesting right atrial pressure of 3 mmHg. Comparison(s): No significant change from prior study. 09/21/2022: LVEF 60-65%, trivial AI. FINDINGS  Left Ventricle: Left ventricular ejection fraction, by estimation, is 65 to 70%. Left ventricular ejection fraction by PLAX is 67 %. The left ventricle has normal function. The left ventricle has no regional wall motion abnormalities. The left ventricular internal cavity size was normal in size. There is no left ventricular hypertrophy. Left ventricular diastolic parameters are consistent with Grade I diastolic dysfunction (impaired relaxation). Indeterminate filling pressures. Right Ventricle: The right ventricular size is normal. No increase in right ventricular wall thickness. Right ventricular systolic function is normal. Left Atrium: Left atrial size was normal in size. Right Atrium: Right atrial size was normal in size. Pericardium: There is no evidence of pericardial effusion. Mitral Valve: The mitral valve is grossly normal. No evidence of mitral valve regurgitation. Tricuspid Valve: The tricuspid valve is grossly normal. Tricuspid valve regurgitation is trivial. Aortic Valve: The aortic valve is tricuspid. Aortic valve regurgitation is trivial. Aortic valve sclerosis/calcification is present, without any evidence of aortic stenosis. Pulmonic Valve: The pulmonic valve was normal in structure. Pulmonic valve regurgitation is not visualized. Aorta: The aortic root and ascending aorta are structurally normal, with no evidence of dilitation. Venous: The inferior vena cava is normal in size with greater than 50% respiratory variability, suggesting right atrial pressure of 3 mmHg. IAS/Shunts: No atrial level shunt detected by color flow  Doppler.  LEFT VENTRICLE PLAX 2D LV EF:         Left            Diastology                ventricular     LV e' medial:    6.42 cm/s                ejection        LV E/e' medial:  6.7                fraction by     LV e' lateral:   8.49 cm/s                PLAX is 67      LV E/e' lateral: 5.0                %. LVIDd:         4.30 cm LVIDs:         2.70 cm LV PW:         1.00 cm LV IVS:        1.00 cm LVOT diam:     2.00 cm LV SV:         44 LV SV Index:   21 LVOT  Area:     3.14 cm  RIGHT VENTRICLE             IVC RV Basal diam:  3.60 cm     IVC diam: 1.00 cm RV S prime:     15.25 cm/s TAPSE (M-mode): 2.2 cm LEFT ATRIUM             Index        RIGHT ATRIUM          Index LA diam:        4.50 cm 2.18 cm/m   RA Area:     9.59 cm LA Vol (A2C):   23.0 ml 11.13 ml/m  RA Volume:   19.30 ml 9.34 ml/m LA Vol (A4C):   28.9 ml 13.99 ml/m LA Biplane Vol: 26.3 ml 12.73 ml/m  AORTIC VALVE LVOT Vmax:   69.15 cm/s LVOT Vmean:  46.400 cm/s LVOT VTI:    0.140 m  AORTA Ao Root diam: 2.80 cm Ao Asc diam:  3.50 cm MITRAL VALVE MV Area (PHT): 2.55 cm    SHUNTS MV Decel Time: 298 msec    Systemic VTI:  0.14 m MV E velocity: 42.70 cm/s  Systemic Diam: 2.00 cm MV A velocity: 68.90 cm/s MV E/A ratio:  0.62 Zoila Shutter MD Electronically signed by Zoila Shutter MD Signature Date/Time: 09/23/2023/11:37:57 AM    Final     Recent Labs: Lab Results  Component Value Date   WBC 6.8 08/06/2023   HGB 11.8 (L) 08/06/2023   PLT 235 08/06/2023   NA 138 09/17/2023   K 4.1 09/17/2023   CL 100 09/17/2023   CO2 21 09/17/2023   GLUCOSE 129 (H) 09/17/2023   BUN 11 09/17/2023   CREATININE 0.87 09/17/2023   BILITOT 0.2 04/16/2023   ALKPHOS 99 04/16/2023   AST 16 04/16/2023   ALT 20 04/16/2023   PROT 6.8 04/16/2023   ALBUMIN 4.0 04/16/2023   CALCIUM 9.7 09/17/2023   GFRAA >60 04/06/2019    Speciality Comments: No specialty comments available.  Procedures:  Large Joint Inj: L knee on 10/04/2023 12:10 PM Indications:  pain Details: 27 G needle, anteromedial approach Medications: 2 mL lidocaine 1 %; 40 mg triamcinolone acetonide 40 MG/ML Outcome: tolerated well, no immediate complications Procedure, treatment alternatives, risks and benefits explained, specific risks discussed. Consent was given by the patient. Immediately prior to procedure a time out was called to verify the correct patient, procedure, equipment, support staff and site/side marked as required. Patient was prepped and draped in the usual sterile fashion.     Allergies: Metformin, Ace inhibitors, Celebrex [celecoxib], and Diovan [valsartan]   Assessment / Plan:     Visit Diagnoses: Positive ANA (antinuclear antibody) - Plan: Sedimentation rate, C3 and C4, ANA Unilateral primary osteoarthritis, left knee - Plan: Large Joint Inj: L knee Chronic pain with arthritis Chronic pain and stiffness in multiple areas, likely due to arthritis. Previous treatment with duloxetine was ineffective. Pain primarily on the right side. Physical therapy for the back ineffective. Significant knee pain with swelling and bone spurs. Considering knee injections to minimize systemic effects on blood sugar. - Administer left knee injection with steroid to alleviate pain and swelling. - Recheck blood tests to assess inflammation levels. - Continue physical therapy exercises for back pain. - Trial of increasing cymbalta to 60 mg daily for at least 1 month due to limited effectiveness  Diabetes mellitus Diabetes may complicate treatment options like steroid injections due to potential effects on blood sugar.  Tingling in fingers and dizziness could be related to diabetes or other conditions. - Monitor blood sugar levels closely, especially after steroid injection.  Ear fluid accumulation Fluid accumulation in the ear monitored by an ear specialist. Scheduled to see a surgeon on May 1st. Reports dizziness and tingling possibly related to ear condition. - Follow up with  ear specialist and surgeon on May 1st for further evaluation of ear fluid accumulation.      Orders: Orders Placed This Encounter  Procedures   Large Joint Inj: L knee   Sedimentation rate   C3 and C4   ANA   Meds ordered this encounter  Medications   DULoxetine (CYMBALTA) 60 MG capsule    Sig: Take 1 capsule (60 mg total) by mouth daily.    Dispense:  30 capsule    Refill:  2     Follow-Up Instructions: Return in about 3 months (around 01/03/2024) for OA/FMS/?ANA CYM/inj f/u 3mos.   Fuller Plan, MD  Note - This record has been created using [employer].  Chart creation errors have been sought, but may not always  have been located. Such creation errors do not reflect on  the standard of medical care.

## 2023-10-04 NOTE — Telephone Encounter (Signed)
 Patient is returning phone call in regard to Echo results.

## 2023-10-04 NOTE — Telephone Encounter (Signed)
Left the pt a message to call the office back. 

## 2023-10-06 ENCOUNTER — Other Ambulatory Visit: Payer: Self-pay | Admitting: Family

## 2023-10-06 LAB — C3 AND C4
C3 Complement: 221 mg/dL — ABNORMAL HIGH (ref 83–193)
C4 Complement: 38 mg/dL (ref 15–57)

## 2023-10-06 LAB — ANTI-NUCLEAR AB-TITER (ANA TITER)
ANA TITER: 1:320 {titer} — ABNORMAL HIGH
ANA Titer 1: 1:40 {titer} — ABNORMAL HIGH

## 2023-10-06 LAB — ANA: Anti Nuclear Antibody (ANA): POSITIVE — AB

## 2023-10-06 LAB — SEDIMENTATION RATE: Sed Rate: 34 mm/h — ABNORMAL HIGH (ref 0–30)

## 2023-10-08 NOTE — Telephone Encounter (Signed)
 The patient has been notified of the result and verbalized understanding.  All questions (if any) were answered. Pt is scheduled to see Dayna Dunn on 4/28. Isobel Marie, RN 10/08/2023 10:24 AM

## 2023-10-08 NOTE — Telephone Encounter (Signed)
 Pt is calling back to speak to a nurse for Echo results

## 2023-10-11 ENCOUNTER — Encounter: Payer: Self-pay | Admitting: Family

## 2023-10-11 ENCOUNTER — Other Ambulatory Visit: Payer: Self-pay | Admitting: Family Medicine

## 2023-10-11 DIAGNOSIS — E1165 Type 2 diabetes mellitus with hyperglycemia: Secondary | ICD-10-CM

## 2023-10-15 ENCOUNTER — Ambulatory Visit: Admitting: Medical

## 2023-10-18 ENCOUNTER — Ambulatory Visit: Attending: Neurological Surgery | Admitting: Physical Therapy

## 2023-10-18 ENCOUNTER — Other Ambulatory Visit: Payer: Self-pay

## 2023-10-18 ENCOUNTER — Telehealth: Payer: Self-pay | Admitting: Family

## 2023-10-18 ENCOUNTER — Encounter: Payer: Self-pay | Admitting: Physical Therapy

## 2023-10-18 DIAGNOSIS — M62838 Other muscle spasm: Secondary | ICD-10-CM | POA: Diagnosis not present

## 2023-10-18 DIAGNOSIS — M6281 Muscle weakness (generalized): Secondary | ICD-10-CM | POA: Diagnosis not present

## 2023-10-18 DIAGNOSIS — M5416 Radiculopathy, lumbar region: Secondary | ICD-10-CM | POA: Insufficient documentation

## 2023-10-18 MED ORDER — CEPHALEXIN 500 MG PO CAPS: 500.0000 mg | ORAL_CAPSULE | Freq: Two times a day (BID) | ORAL | 0 refills | Status: DC

## 2023-10-18 NOTE — Telephone Encounter (Signed)
 Pt came in office stating has a boil on left side of leg (thigh), pt was offered an appt but pt mentioned that provider is aware of her condition and wanted to see if provider can send a medication to pharmacy for issue. Please advise. Pt tel [phone].

## 2023-10-18 NOTE — Therapy (Addendum)
 OUTPATIENT PHYSICAL THERAPY THORACOLUMBAR EVALUATION   Patient Name: [name] MRN: [id number] DOB:[birth year], 63 y.o., female Today's Date: 10/18/2023  END OF SESSION:  PT End of Session - 10/18/23 0803     Visit Number 1    Date for PT Re-Evaluation 12/13/23    Authorization Type [employer] Dual Complete & Donelle Funk    PT Start Time 0802    PT Stop Time 0845    PT Time Calculation (min) 43 min    Activity Tolerance Patient tolerated treatment well;Patient limited by pain    Behavior During Therapy [facility] for tasks assessed/performed             Past Medical History:  Diagnosis Date   Allergy    allergic rhinitis   Arthritis    Atypical chest pain    Constipation    Cyst, ovarian    Diabetes (HCC)    Diabetes mellitus, type II (HCC)    Elevated alkaline phosphatase level    Endometriosis    Esophageal stricture    Fatty liver    Gastroparesis    GERD (gastroesophageal reflux disease)    Heart murmur    Hepatic hemangioma    Hyperlipidemia    Hypertension    Hypertrophic condition of skin    acrokeratoelastoidosis- s/p 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 1/08- benign   Iron  deficiency anemia    Migraine headache    Non-compliance    Peptic ulcer disease    Thyroid  disease    Past Surgical History:  Procedure Laterality Date   ABDOMINAL EXPLORATION SURGERY     w/bso    BREAST BIOPSY     CARDIAC CATHETERIZATION  2009   mild non obstructive CAD   CHOLECYSTECTOMY     COLONOSCOPY     ESOPHAGEAL MANOMETRY N/A 08/01/2020   Procedure: ESOPHAGEAL MANOMETRY (EM);  Surgeon: Ace Holder, MD;  Location: [facility];  Service: Gastroenterology;  Laterality: N/A;   KNEE SURGERY  2005    left knee   LEFT HEART CATH AND CORONARY ANGIOGRAPHY N/A 07/17/2017   Procedure: LEFT HEART CATH AND CORONARY ANGIOGRAPHY;  Surgeon: [place], Peter M, MD;  Location: [facility] INVASIVE CV LAB;  Service: Cardiovascular;  Laterality: N/A;   LEFT HEART CATH AND CORONARY ANGIOGRAPHY N/A 09/01/2021    Procedure: LEFT HEART CATH AND CORONARY ANGIOGRAPHY;  Surgeon: Odie Benne, MD;  Location: [facility] INVASIVE CV LAB;  Service: Cardiovascular;  Laterality: N/A;   POLYPECTOMY     TOTAL ABDOMINAL HYSTERECTOMY     UPPER GASTROINTESTINAL ENDOSCOPY     Patient Active Problem List   Diagnosis Date Noted   Unilateral primary osteoarthritis, left knee 10/04/2023   Type 2 diabetes mellitus with hyperglycemia, without long-term current use of insulin  (HCC) 08/16/2023   Dysfunction of both eustachian tubes 08/16/2023   Otalgia of both ears 08/10/2023   Cramping of hands 04/16/2023   Positive ANA (antinuclear antibody) 03/22/2023   Sedimentation rate elevation 03/22/2023   Skin cyst 11/02/2022   Arthralgia 11/02/2022   Bulging of lumbar intervertebral disc 09/24/2022   Lumbar radiculopathy 09/24/2022   Left arm pain 08/14/2022   Otitis externa of right ear 06/13/2022   Arthritis 05/11/2022   Edema 01/12/2022   Controlled type 2 diabetes mellitus without complication, without long-term current use of insulin  (HCC) 12/01/2021   Facial abscess 09/15/2021   Non-functioning tympanostomy tube 08/25/2021   Cough 08/25/2021   Bilateral impacted cerumen 06/30/2021   Coronary artery disease of native heart with stable angina pectoris (HCC) 06/30/2021  Intertrigo 05/26/2021   DOE (dyspnea on exertion) 12/30/2020   Esophageal dysphagia    Hypercontractile esophagus    Colitis 02/04/2018   Aortic atherosclerosis (HCC) 07/17/2017   Chest pain 07/16/2017   Insomnia 01/12/2017   Breast cyst 06/09/2012   Hyperthyroidism 02/29/2012   Constipation 04/18/2011   Can't get food down 04/18/2011   Ulnar neuropathy 03/30/2011   Low back pain 10/18/2010   Diarrhea 11/01/2009   Fibromyalgia 07/20/2009   Hyperlipidemia 05/26/2009   Vitamin D  deficiency 05/23/2009   Polyarthralgia 04/12/2009   Iron  deficiency anemia 10/25/2008   GERD 10/25/2008   LIVER HEMANGIOMA 01/01/2008   ESOPHAGEAL STRICTURE  01/01/2008   Gastroparesis 01/01/2008   Anemia 12/08/2007   Allergic rhinitis 12/08/2007   LUQ abdominal pain 09/23/2007   Migraine headache 08/11/2007   HIATAL HERNIA 08/11/2007   OVARIAN CYST 08/11/2007   Essential hypertension 05/06/2007    PCP: Dorrene Gaucher, NP  REFERRING PROVIDER: Elna Haggis, MD  REFERRING DIAG: M54.16 (ICD-10-CM) - Radiculopathy, lumbar region   RATIONALE FOR EVALUATION AND TREATMENT: Rehabilitation  THERAPY DIAG:  Radiculopathy, lumbar region  Muscle weakness (generalized)  Other muscle spasm  ONSET DATE: > 1 year  NEXT MD VISIT: 10/25/23  SUBJECTIVE:                                                                                                                                                                                           SUBJECTIVE STATEMENT: LBP started after Zamantha fell. In sitting, shifts or leans R to alleviate pain  PERTINENT HISTORY:  Arthritis, DM-II, Endometriosis, GERD, HTN, heart murmur, iron  deficiency anemia, migraine headaches, fibromyalgia  PAIN:  Are you having pain? Yes: NPRS scale: 5/10 currently, 10/10 worst Pain location: R lower back Pain description: sharp when moving, achy when resting Aggravating factors: Stand >1hr, walking 2-3 mi Relieving factors: Heat, massaging  PRECAUTIONS: None  RED FLAGS: None   WEIGHT BEARING RESTRICTIONS: No  FALLS:  Has patient fallen in last 6 months? No  LIVING ENVIRONMENT: Lives with: lives alone Lives in: House/apartment Stairs: Yes: External: 6 steps; none Has following equipment at home: None  OCCUPATION: Retired  PLOF: Independent and Leisure: Had to quit work, gym, exercise  PATIENT GOALS: Not to have to the pain   OBJECTIVE:  Note: Objective measures were completed at Evaluation unless otherwise noted.  DIAGNOSTIC FINDINGS:  02/28/23 CLINICAL DATA:  Acute left-sided low back pain with left-sided sciatica. Left hip pain.   EXAM: LUMBAR  SPINE - COMPLETE 4+ VIEW   COMPARISON:  Radiographs 04/07/2021   FINDINGS: Five non-rib-bearing lumbar vertebra. Lumbar alignment is normal. Vertebral body heights are normal.  Anterior spurring at multiple levels most prominent at L5-S1. The disc spaces are preserved. No evidence of focal bone lesion, fracture or pars defects. Aortic atherosclerosis.   IMPRESSION: Mild spondylosis with anterior spurring most prominent at L5-S1.  PATIENT SURVEYS:  Modified Oswestry 14/50 = 28%   COGNITION: Overall cognitive status: Within functional limits for tasks assessed     SENSATION: Proprioception: WFL, standing for too long gets numbness  MUSCLE LENGTH: Quad: R=L severe tightness HS: mod tight B ITB: R mild tightness  POSTURE: rounded shoulders, forward head, flexed trunk , and weight shift right  PALPATION: TTP in L5-S1, R QL, upper glutes  LUMBAR ROM:   AROM eval  Flexion Above ankles, p!  Extension Limited, p!  Right lateral flexion Lateral knee  Left lateral flexion Lateral knee, p! R side  Right rotation WFL  Left rotation WFL   (Blank rows = not tested)  LOWER EXTREMITY MMT:    MMT Right eval Left eval  Hip flexion 5 4-  Hip extension 4- 4  Hip abduction 4- 4  Hip adduction 3+ sharp p! 4  Hip internal rotation 3+p! sharp 4-  Hip external rotation 3+sharp p! 4-  Knee flexion 4- 4  Knee extension 4- 4  Ankle dorsiflexion 4- p! 5  Ankle plantarflexion    Ankle inversion    Ankle eversion     (Blank rows = not tested)  LUMBAR SPECIAL TESTS:  Straight leg raise test: Positive R  GAIT: Distance walked: Front desk to treatment room 1 Assistive device utilized: None Level of assistance: Complete Independence Comments: Shifts weight to R to alleviate pain  TREATMENT DATE:                                                                                                                                10/18/23 THERAPEUTIC EXERCISE: To improve strength, endurance,  ROM, and flexibility.  Demonstration, verbal and tactile cues throughout for technique. Supine HS stretch x 30" B Supine KTOS X 30" B Supine trunk rotation x 5 x 3-5" B Modified thomas stretch x 30"   PATIENT EDUCATION:  Education details: PT eval findings, anticipated POC, initial HEP, and role of DN Person educated: Patient Education method: Explanation, Demonstration, Tactile cues, Verbal cues, and Handouts Education comprehension: verbalized understanding, returned demonstration, verbal cues required, tactile cues required, and needs further education  HOME EXERCISE PROGRAM: Access Code: [email or web address] URL: [email or web address] Date: 10/18/2023 Prepared by: Alicja Everitt Joseph-Greene  Exercises - Supine Hamstring Stretch with Strap  - 1 x daily - 7 x weekly - 2 sets - 5 reps - 30 hold - Supine Piriformis Stretch with Leg Straight  - 2 x daily - 7 x weekly - 2 sets - 30 sec hold - Supine Lower Trunk Rotation  - 1 x daily - 7 x weekly - 2 sets - 10 reps - 3-5 hold - Modified Thomas Stretch  - 1 x daily - 7 x weekly -  2 sets - 30 hold  ASSESSMENT:  CLINICAL IMPRESSION: Karely is a 63 y.o. female who was seen today for physical therapy evaluation and treatment for LBP with radiculopathy. She reports LBP began several years ago after a fall. Has previous PT experience with having an episode of LBP with radiculopathy back in 2019. Pain is worse with movement and bending over. She has issues with coming back up from bending, having to crawl up her body to stand up straight. She has a tendency to shift her weight to R side to alleviate pain when sitting or standing. The pain interferes with her ability to work and participate in extracurricular activities that she enjoys. Eboney is limited lumbar ROM and shows deficits in LE strength, abnormal posture and TTP with anormal muscle tension around L5-S1 paraspinals, R upper glutes, and R QL. On Modified Oswestry, she scored 14/50  demonstrating 28% or moderate disability. Sylvan will benefit from skilled PT to address above deficits to improve mobility and activity tolerance with decreased pain interference.    OBJECTIVE IMPAIRMENTS: Abnormal gait, decreased activity tolerance, decreased coordination, decreased endurance, decreased knowledge of condition, decreased mobility, difficulty walking, decreased ROM, decreased strength, increased fascial restrictions, impaired perceived functional ability, increased muscle spasms, impaired flexibility, impaired sensation, impaired tone, improper body mechanics, postural dysfunction, and pain.   ACTIVITY LIMITATIONS: lifting, bending, sitting, standing, sleeping, and locomotion level  PARTICIPATION LIMITATIONS: meal prep, cleaning, laundry, interpersonal relationship, community activity, and occupation  PERSONAL FACTORS: Past/current experiences, Time since onset of injury/illness/exacerbation, and 3+ comorbidities: Arthritis, DM-II, Endometriosis, GERD, HTN, heart murmur, iron  deficiency anemia, migraine headaches, fibromyalgia  are also affecting patient's functional outcome.   REHAB POTENTIAL: Good  CLINICAL DECISION MAKING: Evolving/moderate complexity  EVALUATION COMPLEXITY: Moderate   GOALS: Goals reviewed with patient? Yes  SHORT TERM GOALS: Target date: 11/15/2023  1.  Pt will be independent with initial HEP to improve outcomes and carryover Baseline:  Goal status: INITIAL  2.  Pt will report 25% improvement in low back pain to improve QOL Baseline:  Goal status: INITIAL  LONG TERM GOALS: Target date: 12/13/2023  1.  Pt will be independent with ongoing/advanced HEP for self-management at home Baseline:  Goal status: INITIAL  2.  Pt will report 50-75% improvement in low back pain to improve QOL  Baseline: 5/10 currently, 10/10 worst Goal status: INITIAL  3.  Pt will demonstrate improved LE strength to >/= 4+/5 for improved stability and ease of mobility   Baseline: Refer to LE MMT Goal status: INITIAL  4.  Pt will report </= 2/50 on Modified Oswestry (MCID = 12%) to demonstrate improved functional ability with decreased pain interference Baseline: 14/50 = 28% Goal status: INITIAL  5.  Pt to demonstrate ability to achieve and maintain good spinal alignment/posturing and body mechanics needed for daily activities  Baseline:  Goal status: INITIAL   PLAN:  PT FREQUENCY: 1-2x/week  PT DURATION: 8 weeks  PLANNED INTERVENTIONS: 97164- PT Re-evaluation, 97110-Therapeutic exercises, 97530- Therapeutic activity, 97112- Neuromuscular re-education, 97535- Self Care, 08657- Manual therapy, [id number]- Aquatic Therapy, Q4696- Electrical stimulation (unattended), 97016- Vasopneumatic device, [id number]- Ultrasound, [id number]- Traction (mechanical), [id number]- Ionotophoresis 4mg /ml Dexamethasone , Patient/Family education, Taping, Dry Needling, Joint mobilization, Spinal manipulation, Spinal mobilization, Cryotherapy, and Moist heat.  PLAN FOR NEXT SESSION: Review initial HEP; progress lumbopelvic flexibility & ROM; introduce LE/core strengthening exercises; MT +/- TPDN around L5/S1, QL, upper glutes   Cambren Helm Joseph-Greene, Student-PT 10/18/2023, 9:26 AM

## 2023-10-18 NOTE — Telephone Encounter (Signed)
Send to pharmacy on file

## 2023-10-20 NOTE — Progress Notes (Unsigned)
 Cardiology Office Note    Date:  10/21/2023  ID:  [name], DOB 05/04/1961, MRN [id number] PCP:  Dorrene Gaucher, NP  Cardiologist:  Ola Berger, MD  Electrophysiologist:  None   Chief Complaint: f/u BP, chest pain  History of Present Illness: .    [name] is a 63 y.o. female with visit-pertinent history of predominantly nonobstructive CAD, longstanding chest pain, esophageal stricture, DM2, endometriosis, HTN, inability to use ACE/ARB, HLD, thyroid  disease, fatty liver, IDA, migraine, borderline dilation of aortic root not seen on subsequent study, liver hemangioma, hiatal hernia, sleep apnea by 2024 sleep study, carotid artery disease by CTA 07/2022, epidermodysplasia verruciformis seen for follow-up.   She has had periodic evaluations (cath/stress/CT) for chronic chest pain over the years going back to 2009. Last cath 08/2021 showed mild nonobstructive disease in the LAD and Cx, moderate intermediate branch unchanged from 2019 cath, small caliber PDA too small for PCI, managed medically. Cor CTA 08/2022 showed CAC 1504/99%ile, nonobstructive CAD (aortic root calcifications, moderate mixed plaque inprox RCA, mild diffuse m-distal RCA, minimal LAD plaque with short area of mid-distal LAD with myocardial bridging, mild calcification in the prox-mid LAD (?LCx), also incidentally noted circumferential thickening of the esophagus, underlying mass not excluded, similar to a prior CT. Endoscopy 03/2022 for similar previous abnormal CT findings had shown 1cm HH, benign appearing blebs in lower esophagus, dilation of esophagus, mucosal changes of duodenum. ER visit 07/2023 for fleeting sharp sided left chest pain with left arm discomfort. It was not worse with ambulation or improved with SL NTG. It was reproducible with palpation. She had negative troponins and reassuring EKG. CTA head/neck 07/2023 for suspected peripheral dizziness showed atheromatous change about the carotid siphons with  associated moderate to severe stenoses bilaterally, aortic atherosclerosis. At recent OV with me 08/2023, she was reporting elevated blood pressures so we added chlorthalidone . She cannot take ACE due to cough and ARB due to angioedema. HR has been in the 50s, precluding addition of BB. Echo done 08/2023 for surveillance of aortic root size showed EF 67%, G1DD, normal aortic size.  She is seen back for follow-up today. Her blood pressure has been doing great. However, she has been dealing with some vertigo-like episodic dizziness at times. She is seeing ENT and she reports there is suspicion it is due to fluid trapped behind her ear. She has not had any palpitations or syncope. It is not overtly orthostatic in nature. She does report she has continued to have episodic left sided chest pains described like a SOB/tightness. This does happen at times with exertion (ie. ADLs) but sometimes can exert herself without any symptoms at all. This is occurring occurring nearly daily. Sometimes it can be reproduced with palpation. This is similar to when she had CT 08/2022.   Labwork independently reviewed: 08/2023 K 4.1, Cr 0.87, TFTs wnl 07/2023 LDL 67, trig 144, A1c 7.1, trops neg, Hgb 11.8, plt 235, K 4.1, Cr 0.98, TSH OK 03/2023 LFTs ok  ROS: .    Please see the history of present illness.  All other systems are reviewed and otherwise negative.  Studies Reviewed: Aaron Aas    EKG:  EKG is ordered today, personally reviewed, demonstrating  EKG Interpretation Date/Time:  Monday October 21 2023 15:49:58 EDT Ventricular Rate:  60 PR Interval:  158 QRS Duration:  98 QT Interval:  488 QTC Calculation: 488 R Axis:   -1  Text Interpretation: Normal sinus rhythm Low voltage QRS Incomplete right bundle branch block  Possible Inferior infarct , age undetermined Cannot rule out Anterior infarct , age undetermined Nonspecific T wave abnormality Appears similar to 2024 Confirmed by Jessika Rothery [phone]) on 10/21/2023 4:09:50 PM     CV Studies: Cardiac studies reviewed are outlined and summarized above. Otherwise please see EMR for full report.   Current Reported Medications:.    Current Meds  Medication Sig   ACCU-CHEK GUIDE TEST test strip USE 1 EACH BY IN VITRO ROUTE IN ETH MORNING, AT NOON, AND AT BEDTIME   Accu-Chek Softclix Lancets lancets 3 (three) times daily.   albuterol  (VENTOLIN  HFA) 108 (90 Base) MCG/ACT inhaler INHALE 2 PUFFS BY MOUTH 4 TIMES DAILY   alclomethasone (ACLOVATE ) 0.05 % cream Apply topically 2 (two) times daily as needed (Rash).   amLODipine  (NORVASC ) 10 MG tablet Take 1 tablet by mouth once daily   aspirin  81 MG chewable tablet Chew 1 tablet (81 mg total) by mouth daily.   Blood Glucose Monitoring Suppl DEVI 1 each by Does not apply route in the morning, at noon, and at bedtime. May substitute to any manufacturer covered by patient's insurance.   cephALEXin  (KEFLEX ) 500 MG capsule Take 1 capsule (500 mg total) by mouth 2 (two) times daily.   chlorthalidone  (HYGROTON ) 25 MG tablet Take 1 tablet (25 mg total) by mouth daily.   clobetasol  (TEMOVATE ) 0.05 % external solution Apply 1 application topically 2 (two) times daily.   dapagliflozin  propanediol (FARXIGA ) 5 MG TABS tablet Take 1 tablet (5 mg total) by mouth daily before breakfast.   DULoxetine  (CYMBALTA ) 60 MG capsule Take 1 capsule (60 mg total) by mouth daily.   Evolocumab  (REPATHA  SURECLICK) 140 MG/ML SOAJ INJECT 140 MG SUBCUTANEOUSLY EVERY 14 DAYS   fluticasone  (FLONASE ) 50 MCG/ACT nasal spray Place 2 sprays into both nostrils daily.   furosemide  (LASIX ) 20 MG tablet Take 1 tablet (20 mg total) by mouth daily as needed (for shortness of breath or weight gain of 2 lbs overnight or 5 lbs in a week).   isosorbide  mononitrate (IMDUR ) 60 MG 24 hr tablet TAKE 1 & 1/2 (ONE & ONE-HALF) TABLETS BY MOUTH ONCE DAILY   levothyroxine (SYNTHROID) 112 MCG tablet Take by mouth.   loratadine  (CLARITIN ) 10 MG tablet Take 1 tablet (10 mg total) by  mouth daily.   meclizine  (ANTIVERT ) 12.5 MG tablet Take 1 tablet (12.5 mg total) by mouth 3 (three) times daily as needed for dizziness.   methimazole  (TAPAZOLE ) 5 MG tablet Take 1 tablet by mouth every other day   pantoprazole  (PROTONIX ) 40 MG tablet Take 1 tablet (40 mg total) by mouth daily.   potassium chloride  SA (KLOR-CON  M20) 20 MEQ tablet Take 2 tablets (40 mEq total) by mouth daily.   rosuvastatin  (CRESTOR ) 40 MG tablet Take 1 tablet by mouth once daily   SUMAtriptan  (IMITREX ) 50 MG tablet Take 50 mg by mouth daily as needed for migraine.   tretinoin  (RETIN-A ) 0.025 % cream Apply topically at bedtime. For the 1st month apply 2 nights weekly after one month increase to M-W-F nights, cut back usage to 1-2 nights weekly if you experience excessive dryness,   XIIDRA 5 % SOLN Apply 1 drop to eye 2 (two) times daily.   [DISCONTINUED] metoprolol  succinate (TOPROL -XL) 50 MG 24 hr tablet     Physical Exam:    VS:  BP 112/66 (BP Location: Left Arm, Patient Position: Sitting, Cuff Size: Normal)   Pulse 64   Ht 5\' 4"  (1.626 m)   Wt 225 lb  9.6 oz (102.3 kg)   SpO2 97%   BMI 38.72 kg/m    Wt Readings from Last 3 Encounters:  10/21/23 225 lb 9.6 oz (102.3 kg)  10/04/23 226 lb (102.5 kg)  10/01/23 228 lb 9.6 oz (103.7 kg)    GEN: Well nourished, well developed in no acute distress NECK: No JVD; No carotid bruits CARDIAC: RRR, no murmurs, rubs, gallops RESPIRATORY:  Clear to auscultation without rales, wheezing or rhonchi  ABDOMEN: Soft, non-tender, non-distended EXTREMITIES:  No edema; No acute deformity. + changes c/w EDV  Asessement and Plan:.    1. Precordial pain, CAD, HLD - continues to have episodic spells similar to when she had her CT 08/2022. ED visit 07/2023 with reassuring labs. EKG today similar to 2024. Mixed features. We will update CBC, BMET today. Plan cardiac PET to see if we can localize any ischemia. She remains on amlodipine , Imdur  as anti-anginals at this time.  Metoprolol  was auto-populating on her list for unclear reasons; she has not been on this recently and does not remember stopping it for any particular reason. Hold off acutely restarting at this time pending completion of testing.  Informed Consent   Shared Decision Making/Informed Consent The risks [chest pain, shortness of breath, cardiac arrhythmias, dizziness, blood pressure fluctuations, myocardial infarction, stroke/transient ischemic attack, nausea, vomiting, allergic reaction, radiation exposure, metallic taste sensation and life-threatening complications (estimated to be 1 in 10,000)], benefits (risk stratification, diagnosing coronary artery disease, treatment guidance) and alternatives of a cardiac PET stress test were discussed in detail with Ms. Boley and she agrees to proceed.     2. Essential HTN - BP controlled on present regimen. No acute changes made today. Update BMET given chlorthalidone  initiation last month to ensure remaining stable.  3. Sleep apnea - she has not heard back from our sleep team yet about initiation of CPAP. OSA diagnosed last year. Have asked Maeola Schmidt to reach out to sleep team to ensure they communicate with patient about next steps.  4. Carotid artery disease, dizziness - she is seeing ENT for vertigo-like dizziness with some improvement with meclizine . She was also noted to have previous cerebrovascular disease on CTA higher up in the carotid siphons. Per shared decision making we will refer to neurology for completeness. Otherwise continue statin therapy.    Disposition: F/u with me in 8 weeks.  Signed, Tabari Volkert N Alarik Radu, PA-C

## 2023-10-21 ENCOUNTER — Telehealth: Payer: Self-pay | Admitting: *Deleted

## 2023-10-21 ENCOUNTER — Encounter: Payer: Self-pay | Admitting: Physician Assistant

## 2023-10-21 ENCOUNTER — Ambulatory Visit: Attending: Physician Assistant | Admitting: Physician Assistant

## 2023-10-21 VITALS — BP 112/66 | HR 60 | Ht 64.0 in | Wt 225.6 lb

## 2023-10-21 DIAGNOSIS — G4730 Sleep apnea, unspecified: Secondary | ICD-10-CM

## 2023-10-21 DIAGNOSIS — I2510 Atherosclerotic heart disease of native coronary artery without angina pectoris: Secondary | ICD-10-CM | POA: Diagnosis not present

## 2023-10-21 DIAGNOSIS — I6523 Occlusion and stenosis of bilateral carotid arteries: Secondary | ICD-10-CM

## 2023-10-21 DIAGNOSIS — R072 Precordial pain: Secondary | ICD-10-CM

## 2023-10-21 DIAGNOSIS — E785 Hyperlipidemia, unspecified: Secondary | ICD-10-CM | POA: Diagnosis not present

## 2023-10-21 DIAGNOSIS — I10 Essential (primary) hypertension: Secondary | ICD-10-CM

## 2023-10-21 DIAGNOSIS — G4733 Obstructive sleep apnea (adult) (pediatric): Secondary | ICD-10-CM

## 2023-10-21 NOTE — Telephone Encounter (Signed)
 Patient had a sleep study last year and was ordered a cpap titration that never got scheduled. She was in the office today with questions about her cpap. The dme was contacted to see if the patient needed to retest or if an auto pap could be ordered. The dme Iran Manna said send the order and they will run the order and see if the insurance takes it. If the insurance takes it [facility] will order the patients cpap and call the patient.   DME selection is [facility] Patient understands he will be contacted by [facility] to set up his cpap. Patient understands to call if [facility] does not contact him with new setup in a timely manner. Patient understands they will be called once confirmation has been received from [facility] that they have received their new machine to schedule 10 week follow up appointment.   [facility] notified of new cpap order  Please add to airview Patient was grateful for the call and thanked me

## 2023-10-21 NOTE — Patient Instructions (Signed)
 Medication Instructions:  Your physician recommends that you continue on your current medications as directed. Please refer to the Current Medication list given to you today.   *If you need a refill on your cardiac medications before your next appointment, please call your pharmacy*  Lab Work: TODAY:  BMET & CBC  If you have labs (blood work) drawn today and your tests are completely normal, you will receive your results only by: MyChart Message (if you have MyChart) OR A paper copy in the mail If you have any lab test that is abnormal or we need to change your treatment, we will call you to review the results.  Testing/Procedures:    Please report to Radiology at the [facility] Main Entrance 30 minutes early for your test.  [address] [place], [place] [address]                         OR   Please report to Radiology at [facility] Main Entrance, medical mall, 30 mins prior to your test.  [address]  [place], [place]  How to Prepare for Your Cardiac PET/CT Stress Test:  Nothing to eat or drink, except water, 3 hours prior to arrival time.  NO caffeine/decaffeinated products, or chocolate 12 hours prior to arrival. (Please note decaffeinated beverages (teas/coffees) still contain caffeine).  If you have caffeine within 12 hours prior, the test will need to be rescheduled.  Medication instructions:  Do not take nitrates (isosorbide  mononitrate, Ranexa) the day before or day of test Do not take tamsulosin the day before or morning of test Hold theophylline containing medications for 12 hours. Hold Dipyridamole 48 hours prior to the test.  Diabetic Preparation: If able to eat breakfast prior to 3 hour fasting, you may take all medications, including your insulin . Do not worry if you miss your breakfast dose of insulin  - start at your next meal. If you do not eat prior to 3 hour fast-Hold all diabetes (oral and insulin )  medications. Patients who wear a continuous glucose monitor MUST remove the device prior to scanning.  You may take your remaining medications with water.  NO perfume, cologne or lotion on chest or abdomen area. FEMALES - Please avoid wearing dresses to this appointment.  Total time is 1 to 2 hours; you may want to bring reading material for the waiting time.  IF YOU THINK YOU MAY BE PREGNANT, OR ARE NURSING PLEASE INFORM THE TECHNOLOGIST.  In preparation for your appointment, medication and supplies will be purchased.  Appointment availability is limited, so if you need to cancel or reschedule, please call the Radiology Department Scheduler at [phone] 24 hours in advance to avoid a cancellation fee of $100.00  What to Expect When you Arrive:  Once you arrive and check in for your appointment, you will be taken to a preparation room within the Radiology Department.  A technologist or Nurse will obtain your medical history, verify that you are correctly prepped for the exam, and explain the procedure.  Afterwards, an IV will be started in your arm and electrodes will be placed on your skin for EKG monitoring during the stress portion of the exam. Then you will be escorted to the PET/CT scanner.  There, staff will get you positioned on the scanner and obtain a blood pressure and EKG.  During the exam, you will continue to be connected to the EKG and blood pressure machines.  A small, safe amount of a radioactive tracer will be injected in your IV to obtain a series of pictures of your heart along with an injection of a stress agent.    After your Exam:  It is recommended that you eat a meal and drink a caffeinated beverage to counter act any effects of the stress agent.  Drink plenty of fluids for the remainder of the day and urinate frequently for the first couple of hours after the exam.  Your doctor will inform you of your test results within 7-10 business days.  For more information and  frequently asked questions, please visit our website: [email or web address]  For questions about your test or how to prepare for your test, please call: Cardiac Imaging Nurse Navigators Office: [phone]   You have been referred to [facility] Neurology   Follow-Up: At [facility], you and your health needs are our priority.  As part of our continuing mission to provide you with exceptional heart care, our providers are all part of one team.  This team includes your primary Cardiologist (physician) and Advanced Practice Providers or APPs (Physician Assistants and Nurse Practitioners) who all work together to provide you with the care you need, when you need it.  Your next appointment:   8 week(s)  Provider:   Graciela Lava, PA-C          We recommend signing up for the patient portal called "MyChart".  Sign up information is provided on this After Visit Summary.  MyChart is used to connect with patients for Virtual Visits (Telemedicine).  Patients are able to view lab/test results, encounter notes, upcoming appointments, etc.  Non-urgent messages can be sent to your provider as well.   To learn more about what you can do with MyChart, go to [email or web address].   Other Instructions

## 2023-10-22 ENCOUNTER — Other Ambulatory Visit: Payer: Self-pay | Admitting: Internal Medicine

## 2023-10-22 ENCOUNTER — Other Ambulatory Visit: Payer: Self-pay | Admitting: Family

## 2023-10-22 ENCOUNTER — Other Ambulatory Visit: Payer: Self-pay | Admitting: Family Medicine

## 2023-10-22 DIAGNOSIS — E1165 Type 2 diabetes mellitus with hyperglycemia: Secondary | ICD-10-CM

## 2023-10-22 DIAGNOSIS — E0590 Thyrotoxicosis, unspecified without thyrotoxic crisis or storm: Secondary | ICD-10-CM

## 2023-10-22 LAB — BASIC METABOLIC PANEL WITH GFR
BUN/Creatinine Ratio: 22 (ref 12–28)
BUN: 16 mg/dL (ref 8–27)
CO2: 24 mmol/L (ref 20–29)
Calcium: 9.6 mg/dL (ref 8.7–10.3)
Chloride: 98 mmol/L (ref 96–106)
Creatinine, Ser: 0.74 mg/dL (ref 0.57–1.00)
Glucose: 103 mg/dL — ABNORMAL HIGH (ref 70–99)
Potassium: 3.9 mmol/L (ref 3.5–5.2)
Sodium: 140 mmol/L (ref 134–144)
eGFR: 90 mL/min/{1.73_m2} (ref >59)

## 2023-10-22 LAB — CBC
Hematocrit: 37.9 % (ref 34.0–46.6)
Hemoglobin: 11.6 g/dL (ref 11.1–15.9)
MCH: 22.4 pg — ABNORMAL LOW (ref 26.6–33.0)
MCHC: 30.6 g/dL — ABNORMAL LOW (ref 31.5–35.7)
MCV: 73 fL — ABNORMAL LOW (ref 79–97)
Platelets: 220 10*3/uL (ref 150–450)
RBC: 5.18 x10E6/uL (ref 3.77–5.28)
RDW: 14.3 % (ref 11.7–15.4)
WBC: 7.2 10*3/uL (ref 3.4–10.8)

## 2023-10-24 ENCOUNTER — Encounter: Payer: Self-pay | Admitting: Family

## 2023-10-24 DIAGNOSIS — H6991 Unspecified Eustachian tube disorder, right ear: Secondary | ICD-10-CM | POA: Diagnosis not present

## 2023-10-25 ENCOUNTER — Ambulatory Visit: Attending: Neurological Surgery

## 2023-10-25 ENCOUNTER — Ambulatory Visit (INDEPENDENT_AMBULATORY_CARE_PROVIDER_SITE_OTHER): Admitting: Family

## 2023-10-25 ENCOUNTER — Encounter: Admitting: Physical Therapy

## 2023-10-25 VITALS — BP 113/61 | HR 67 | Temp 98.1°F | Resp 16 | Ht 64.0 in | Wt 222.0 lb

## 2023-10-25 DIAGNOSIS — L02416 Cutaneous abscess of left lower limb: Secondary | ICD-10-CM | POA: Diagnosis not present

## 2023-10-25 DIAGNOSIS — H6993 Unspecified Eustachian tube disorder, bilateral: Secondary | ICD-10-CM

## 2023-10-25 DIAGNOSIS — M62838 Other muscle spasm: Secondary | ICD-10-CM | POA: Insufficient documentation

## 2023-10-25 DIAGNOSIS — M5416 Radiculopathy, lumbar region: Secondary | ICD-10-CM | POA: Diagnosis not present

## 2023-10-25 DIAGNOSIS — M6281 Muscle weakness (generalized): Secondary | ICD-10-CM | POA: Insufficient documentation

## 2023-10-25 NOTE — Therapy (Signed)
 OUTPATIENT PHYSICAL THERAPY THORACOLUMBAR TREATMENT   Patient Name: [name] MRN: [id number] DOB:08-Aug-1960, 63 y.o., female Today's Date: 10/25/2023  END OF SESSION:  PT End of Session - 10/25/23 0953     Visit Number 2    Date for PT Re-Evaluation 12/13/23    Authorization Type [employer] Dual Complete & [employer] State    PT Start Time [phone]    PT Stop Time 1020    PT Time Calculation (min) 45 min    Activity Tolerance Patient tolerated treatment well;Patient limited by pain    Behavior During Therapy [facility] for tasks assessed/performed              Past Medical History:  Diagnosis Date   Allergy    allergic rhinitis   Arthritis    Atypical chest pain    Constipation    Cyst, ovarian    Diabetes (HCC)    Diabetes mellitus, type II (HCC)    Elevated alkaline phosphatase level    Endometriosis    Esophageal stricture    Fatty liver    Gastroparesis    GERD (gastroesophageal reflux disease)    Heart murmur    Hepatic hemangioma    Hyperlipidemia    Hypertension    Hypertrophic condition of skin    acrokeratoelastoidosis- s/p 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 1/08- benign   Iron  deficiency anemia    Migraine headache    Non-compliance    Peptic ulcer disease    Thyroid  disease    Past Surgical History:  Procedure Laterality Date   ABDOMINAL EXPLORATION SURGERY     w/bso    BREAST BIOPSY     CARDIAC CATHETERIZATION  2009   mild non obstructive CAD   CHOLECYSTECTOMY     COLONOSCOPY     ESOPHAGEAL MANOMETRY N/A 08/01/2020   Procedure: ESOPHAGEAL MANOMETRY (EM);  Surgeon: Ace Holder, MD;  Location: [facility] ENDOSCOPY;  Service: Gastroenterology;  Laterality: N/A;   KNEE SURGERY  2005    left knee   LEFT HEART CATH AND CORONARY ANGIOGRAPHY N/A 07/17/2017   Procedure: LEFT HEART CATH AND CORONARY ANGIOGRAPHY;  Surgeon: [place], Peter M, MD;  Location: [facility] INVASIVE CV LAB;  Service: Cardiovascular;  Laterality: N/A;   LEFT HEART CATH AND CORONARY ANGIOGRAPHY N/A 09/01/2021    Procedure: LEFT HEART CATH AND CORONARY ANGIOGRAPHY;  Surgeon: Odie Benne, MD;  Location: [facility] INVASIVE CV LAB;  Service: Cardiovascular;  Laterality: N/A;   POLYPECTOMY     TOTAL ABDOMINAL HYSTERECTOMY     UPPER GASTROINTESTINAL ENDOSCOPY     Patient Active Problem List   Diagnosis Date Noted   Cutaneous abscess of left lower extremity 10/25/2023   Unilateral primary osteoarthritis, left knee 10/04/2023   Type 2 diabetes mellitus with hyperglycemia, without long-term current use of insulin  (HCC) 08/16/2023   Dysfunction of both eustachian tubes 08/16/2023   Positive ANA (antinuclear antibody) 03/22/2023   Sedimentation rate elevation 03/22/2023   Bulging of lumbar intervertebral disc 09/24/2022   Left arm pain 08/14/2022   Arthritis 05/11/2022   Controlled type 2 diabetes mellitus without complication, without long-term current use of insulin  (HCC) 12/01/2021   Coronary artery disease of native heart with stable angina pectoris (HCC) 06/30/2021   Intertrigo 05/26/2021   Hypercontractile esophagus    Colitis 02/04/2018   Aortic atherosclerosis (HCC) 07/17/2017   Insomnia 01/12/2017   Hyperthyroidism 02/29/2012   Esophageal dysphagia 04/18/2011   Ulnar neuropathy 03/30/2011   Lumbar radiculopathy 10/18/2010   Fibromyalgia 07/20/2009   Hyperlipidemia 05/26/2009  Vitamin D  deficiency 05/23/2009   Polyarthralgia 04/12/2009   Iron  deficiency anemia 10/25/2008   GERD 10/25/2008   LIVER HEMANGIOMA 01/01/2008   ESOPHAGEAL STRICTURE 01/01/2008   Gastroparesis 01/01/2008   Anemia 12/08/2007   Allergic rhinitis 12/08/2007   LUQ abdominal pain 09/23/2007   Migraine headache 08/11/2007   HIATAL HERNIA 08/11/2007   OVARIAN CYST 08/11/2007   Essential hypertension 05/06/2007    PCP: Dorrene Gaucher, NP  REFERRING PROVIDER: Elna Haggis, MD  REFERRING DIAG: M54.16 (ICD-10-CM) - Radiculopathy, lumbar region   RATIONALE FOR EVALUATION AND TREATMENT:  Rehabilitation  THERAPY DIAG:  Radiculopathy, lumbar region  Muscle weakness (generalized)  Other muscle spasm  ONSET DATE: > 1 year  NEXT MD VISIT: 10/25/23  SUBJECTIVE:                                                                                                                                                                                           SUBJECTIVE STATEMENT: Pain is the same today, it's always the same.  PERTINENT HISTORY:  Arthritis, DM-II, Endometriosis, GERD, HTN, heart murmur, iron  deficiency anemia, migraine headaches, fibromyalgia  PAIN:  Are you having pain? Yes: NPRS scale: 5/10 currently, 10/10 worst Pain location: R lower back Pain description: sharp when moving, achy when resting Aggravating factors: Stand >1hr, walking 2-3 mi Relieving factors: Heat, massaging  PRECAUTIONS: None  RED FLAGS: None   WEIGHT BEARING RESTRICTIONS: No  FALLS:  Has patient fallen in last 6 months? No  LIVING ENVIRONMENT: Lives with: lives alone Lives in: House/apartment Stairs: Yes: External: 6 steps; none Has following equipment at home: None  OCCUPATION: Retired  PLOF: Independent and Leisure: Had to quit work, gym, exercise  PATIENT GOALS: Not to have to the pain   OBJECTIVE:  Note: Objective measures were completed at Evaluation unless otherwise noted.  DIAGNOSTIC FINDINGS:  02/28/23 CLINICAL DATA:  Acute left-sided low back pain with left-sided sciatica. Left hip pain.   EXAM: LUMBAR SPINE - COMPLETE 4+ VIEW   COMPARISON:  Radiographs 04/07/2021   FINDINGS: Five non-rib-bearing lumbar vertebra. Lumbar alignment is normal. Vertebral body heights are normal. Anterior spurring at multiple levels most prominent at L5-S1. The disc spaces are preserved. No evidence of focal bone lesion, fracture or pars defects. Aortic atherosclerosis.   IMPRESSION: Mild spondylosis with anterior spurring most prominent at L5-S1.  PATIENT SURVEYS:  Modified  Oswestry 14/50 = 28%   COGNITION: Overall cognitive status: Within functional limits for tasks assessed     SENSATION: Proprioception: WFL, standing for too long gets numbness  MUSCLE LENGTH: Quad: R=L severe tightness HS: mod tight B ITB: R mild tightness  POSTURE: rounded shoulders, forward head, flexed trunk , and weight shift right  PALPATION: TTP in L5-S1, R QL, upper glutes  LUMBAR ROM:   AROM eval  Flexion Above ankles, p!  Extension Limited, p!  Right lateral flexion Lateral knee  Left lateral flexion Lateral knee, p! R side  Right rotation WFL  Left rotation WFL   (Blank rows = not tested)  LOWER EXTREMITY MMT:    MMT Right eval Left eval  Hip flexion 5 4-  Hip extension 4- 4  Hip abduction 4- 4  Hip adduction 3+ sharp p! 4  Hip internal rotation 3+p! sharp 4-  Hip external rotation 3+sharp p! 4-  Knee flexion 4- 4  Knee extension 4- 4  Ankle dorsiflexion 4- p! 5  Ankle plantarflexion    Ankle inversion    Ankle eversion     (Blank rows = not tested)  LUMBAR SPECIAL TESTS:  Straight leg raise test: Positive R  GAIT: Distance walked: Front desk to treatment room 1 Assistive device utilized: None Level of assistance: Complete Independence Comments: Shifts weight to R to alleviate pain  TREATMENT DATE:                                                                                                                               10/25/23 THERAPEUTIC EXERCISE: To improve strength, endurance, ROM, and flexibility.  Demonstration, verbal and tactile cues throughout for technique. Nustep L5x15min UE/LE Supine HS stretch x 30" B Supine KTOS X 30" B Supine trunk rotation x 5 x 3-5" B Modified thomas stretch x 30" B Orange pball rollout flexion x 10 NEUROMUSCULAR RE-EDUCATION: To improve proprioception, balance, and kinesthesia. Ab sets with orange pball 10x5" Ball squeeze with ab sets 10x5" Ab set with bent knee fallout RTB x 10 Seated rows  and shoulder  extension RTB x 10- cues to avoid scap elevation and for retraction  10/18/23 THERAPEUTIC EXERCISE: To improve strength, endurance, ROM, and flexibility.  Demonstration, verbal and tactile cues throughout for technique. Supine HS stretch x 30" B Supine KTOS X 30" B Supine trunk rotation x 10 x 3-5" B Modified thomas stretch x 30"   PATIENT EDUCATION:  Education details: HEP update - bent knee fallout RTB and hip ball squeeze Person educated: Patient Education method: Explanation, Demonstration, Tactile cues, Verbal cues, and Handouts Education comprehension: verbalized understanding, returned demonstration, verbal cues required, tactile cues required, and needs further education  HOME EXERCISE PROGRAM: Access Code: [email or web address] URL: [email or web address] Date: 10/25/2023 Prepared by: Annah Jasko  Exercises - Supine Hamstring Stretch with Strap  - 1 x daily - 7 x weekly - 2 sets - 5 reps - 30 hold - Supine Piriformis Stretch with Leg Straight  - 2 x daily - 7 x weekly - 2 sets - 30 sec hold - Supine Lower Trunk Rotation  - 1 x daily - 7 x weekly - 2 sets - 10 reps - 3-5 hold - Modified  Thomas Stretch  - 1 x daily - 7 x weekly - 2 sets - 30 hold - Supine Hip Adduction Isometric with Ball  - 1 x daily - 7 x weekly - 2 sets - 10 reps - 5 sec hold - Hooklying Clamshell with Resistance  - 1 x daily - 7 x weekly - 2 sets - 10 reps - 3 sec hold  ASSESSMENT:  CLINICAL IMPRESSION: We reviewed her HEP, providing cues to pull until feeling a good stretch with hamstring stretch. Some cues for proper hold times with the lower trunk rotations. Able to progress with light core activation and hip strengthening. Cues to avoid scapular elevation and for retraction with the rows. Good tolerance for treatment overall, no increased pain.   IE: Shoshana is a 63 y.o. female who was seen today for physical therapy evaluation and treatment for LBP with radiculopathy. She reports LBP began several  years ago after a fall. Has previous PT experience with having an episode of LBP with radiculopathy back in 2019. Pain is worse with movement and bending over. She has issues with coming back up from bending, having to crawl up her body to stand up straight. She has a tendency to shift her weight to R side to alleviate pain when sitting or standing. The pain interferes with her ability to work and participate in extracurricular activities that she enjoys. Lakeia is limited lumbar ROM and shows deficits in LE strength, abnormal posture and TTP with anormal muscle tension around L5-S1 paraspinals, R upper glutes, and R QL. On Modified Oswestry, she scored 14/50 demonstrating 28% or moderate disability. Girlie will benefit from skilled PT to address above deficits to improve mobility and activity tolerance with decreased pain interference.    OBJECTIVE IMPAIRMENTS: Abnormal gait, decreased activity tolerance, decreased coordination, decreased endurance, decreased knowledge of condition, decreased mobility, difficulty walking, decreased ROM, decreased strength, increased fascial restrictions, impaired perceived functional ability, increased muscle spasms, impaired flexibility, impaired sensation, impaired tone, improper body mechanics, postural dysfunction, and pain.   ACTIVITY LIMITATIONS: lifting, bending, sitting, standing, sleeping, and locomotion level  PARTICIPATION LIMITATIONS: meal prep, cleaning, laundry, interpersonal relationship, community activity, and occupation  PERSONAL FACTORS: Past/current experiences, Time since onset of injury/illness/exacerbation, and 3+ comorbidities: Arthritis, DM-II, Endometriosis, GERD, HTN, heart murmur, iron  deficiency anemia, migraine headaches, fibromyalgia  are also affecting patient's functional outcome.   REHAB POTENTIAL: Good  CLINICAL DECISION MAKING: Evolving/moderate complexity  EVALUATION COMPLEXITY: Moderate   GOALS: Goals reviewed with patient?  Yes  SHORT TERM GOALS: Target date: 11/15/2023  1.  Pt will be independent with initial HEP to improve outcomes and carryover Baseline:  Goal status: INITIAL  2.  Pt will report 25% improvement in low back pain to improve QOL Baseline:  Goal status: INITIAL  LONG TERM GOALS: Target date: 12/13/2023  1.  Pt will be independent with ongoing/advanced HEP for self-management at home Baseline:  Goal status: INITIAL  2.  Pt will report 50-75% improvement in low back pain to improve QOL  Baseline: 5/10 currently, 10/10 worst Goal status: INITIAL  3.  Pt will demonstrate improved LE strength to >/= 4+/5 for improved stability and ease of mobility  Baseline: Refer to LE MMT Goal status: INITIAL  4.  Pt will report </= 2/50 on Modified Oswestry (MCID = 12%) to demonstrate improved functional ability with decreased pain interference Baseline: 14/50 = 28% Goal status: INITIAL  5.  Pt to demonstrate ability to achieve and maintain good spinal alignment/posturing and body mechanics  needed for daily activities  Baseline:  Goal status: INITIAL   PLAN:  PT FREQUENCY: 1-2x/week  PT DURATION: 8 weeks  PLANNED INTERVENTIONS: 97164- PT Re-evaluation, 97110-Therapeutic exercises, 97530- Therapeutic activity, 97112- Neuromuscular re-education, 97535- Self Care, 29518- Manual therapy, [id number]- Aquatic Therapy, [id number]- Electrical stimulation (unattended), 97016- Vasopneumatic device, [id number]- Ultrasound, [id number]- Traction (mechanical), [id number]- Ionotophoresis 4mg /ml Dexamethasone , Patient/Family education, Taping, Dry Needling, Joint mobilization, Spinal manipulation, Spinal mobilization, Cryotherapy, and Moist heat.  PLAN FOR NEXT SESSION: progress lumbopelvic flexibility & ROM; introduce LE/core strengthening exercises; MT +/- TPDN around L5/S1, QL, upper glutes   Iver Fehrenbach L Suzetta Timko, PTA 10/25/2023, 10:29 AM

## 2023-10-25 NOTE — Assessment & Plan Note (Signed)
 Improving with Keflex . - Continue Keflex . - Continue warm compresses. - Consider incision and drainage if no further improvement.

## 2023-10-25 NOTE — Progress Notes (Signed)
 Subjective:     Patient ID: [name], female    DOB: [birth year], 63 y.o.   MRN: [id number]  Chief Complaint  Patient presents with   Recurrent Skin Infections    Patient reports having boil on left groin and abdomen    HPI  Discussed the use of AI scribe software for clinical note transcription with the patient, who gave verbal consent to proceed.  History of Present Illness [name] is a 63 year old female who presents with a boil on her right side and dizziness.  She has a boil on her right side with a slight mushy center. Keflex  has been prescribed, resulting in improvement.  She experiences dizziness. There was a previous discussion about monitoring this condition for two months, with mention of 'plaque or something going upwards.'  Her diabetes management is stable, with blood sugar levels around 138 mg/dL in the morning and 952 mg/dL in the evening.  Lab Results  Component Value Date   HGBA1C 7.1 (H) 08/09/2023       Health Maintenance Due  Topic Date Due   OPHTHALMOLOGY EXAM  Never done   FOOT EXAM  05/30/2013   DTaP/Tdap/Td (3 - Td or Tdap) 04/29/2021   COVID-19 Vaccine (5 - 2024-25 season) 02/24/2023    Past Medical History:  Diagnosis Date   Allergy    allergic rhinitis   Arthritis    Atypical chest pain    Constipation    Cyst, ovarian    Diabetes (HCC)    Diabetes mellitus, type II (HCC)    Elevated alkaline phosphatase level    Endometriosis    Esophageal stricture    Fatty liver    Gastroparesis    GERD (gastroesophageal reflux disease)    Heart murmur    Hepatic hemangioma    Hyperlipidemia    Hypertension    Hypertrophic condition of skin    acrokeratoelastoidosis- s/p 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 1/08- benign   Iron  deficiency anemia    Migraine headache    Non-compliance    Peptic ulcer disease    Thyroid  disease     Past Surgical History:  Procedure Laterality Date   ABDOMINAL EXPLORATION SURGERY     w/bso    BREAST  BIOPSY     CARDIAC CATHETERIZATION  2009   mild non obstructive CAD   CHOLECYSTECTOMY     COLONOSCOPY     ESOPHAGEAL MANOMETRY N/A 08/01/2020   Procedure: ESOPHAGEAL MANOMETRY (EM);  Surgeon: Ace Holder, MD;  Location: [facility];  Service: Gastroenterology;  Laterality: N/A;   KNEE SURGERY  2005    left knee   LEFT HEART CATH AND CORONARY ANGIOGRAPHY N/A 07/17/2017   Procedure: LEFT HEART CATH AND CORONARY ANGIOGRAPHY;  Surgeon: [place], Peter M, MD;  Location: [facility] INVASIVE CV LAB;  Service: Cardiovascular;  Laterality: N/A;   LEFT HEART CATH AND CORONARY ANGIOGRAPHY N/A 09/01/2021   Procedure: LEFT HEART CATH AND CORONARY ANGIOGRAPHY;  Surgeon: Odie Benne, MD;  Location: [facility] INVASIVE CV LAB;  Service: Cardiovascular;  Laterality: N/A;   POLYPECTOMY     TOTAL ABDOMINAL HYSTERECTOMY     UPPER GASTROINTESTINAL ENDOSCOPY      Family History  Problem Relation Age of Onset   Hypertension Mother    Rheum arthritis Mother    Hypertension Father    Diabetes Father    Prostate cancer Father    Kidney disease Father    Diabetes Sister    Fibromyalgia Sister    Diabetes  Maternal Grandmother    Lung cancer Maternal Grandfather    Arthritis Brother    Migraines Brother    CAD Brother    Fibromyalgia Sister    Migraines Sister    Colon cancer Neg Hx    Thyroid  disease Neg Hx    Colon polyps Neg Hx     Social History   Socioeconomic History   Marital status: Widowed    Spouse name: Not on file   Number of children: 2   Years of education: Not on file   Highest education level: Not on file  Occupational History   Occupation: DISABILITY    Employer: UNEMPLOYED  Tobacco Use   Smoking status: Former    Current packs/day: 0.00    Average packs/day: 0.5 packs/day for 18.0 years (9.0 ttl pk-yrs)    Types: Cigarettes    Start date: 07/29/1977    Quit date: 06/25/1994    Years since quitting: 29.3   Smokeless tobacco: Never   Tobacco comments:    quit 19 years ago   Vaping Use   Vaping status: Never Used  Substance and Sexual Activity   Alcohol use: Yes    Alcohol/week: 0.0 standard drinks of alcohol    Comment: social drinker   Drug use: No   Sexual activity: Not Currently  Other Topics Concern   Not on file  Social History Narrative   Widowed   Has 2 grown children.  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    Former Smoker Quit tobacco in 1996.  She was a pack a day smoker for approximately 10 years.    Alcohol use-yes: Social    Daily Caffeine Use:6 pack of pepsi daily     Illicit Drug Use - no    Patient does not get regular exercise.       Smoking Status:  quit   Social Drivers of Health   Financial Resource Strain: Low Risk  (01/16/2023)   Overall Financial Resource Strain (CARDIA)    Difficulty of Paying Living Expenses: Not hard at all  Food Insecurity: No Food Insecurity (01/16/2023)   Hunger Vital Sign    Worried About Running Out of Food in the Last Year: Never true    Ran Out of Food in the Last Year: Never true  Transportation Needs: No Transportation Needs (01/16/2023)   PRAPARE - Administrator, Civil Service (Medical): No    Lack of Transportation (Non-Medical): No  Physical Activity: Inactive (06/10/2023)   Exercise Vital Sign    Days of Exercise per Week: 0 days    Minutes of Exercise per Session: 0 min  Stress: No Stress Concern Present (01/16/2023)   [employer] of Occupational Health - Occupational Stress Questionnaire    Feeling of Stress : Not at all  Social Connections: Moderately Isolated (06/10/2023)   Social Connection and Isolation Panel [NHANES]    Frequency of Communication with Friends and Family: More than three times a week    Frequency of Social Gatherings with Friends and Family: More than three times a week    Attends Religious Services: More than 4 times per year    Active Member of [employer] or Organizations: No    Attends Tax inspector Meetings: Never    Marital Status: Widowed  Intimate Partner Violence: Not At Risk (06/08/2022)   Humiliation, Afraid, Rape, and Kick questionnaire    Fear of Current or Ex-Partner: No    Emotionally  Abused: No    Physically Abused: No    Sexually Abused: No    Outpatient Medications Prior to Visit  Medication Sig Dispense Refill   Accu-Chek Softclix Lancets lancets 3 (three) times daily.     albuterol  (VENTOLIN  HFA) 108 (90 Base) MCG/ACT inhaler INHALE 2 PUFFS BY MOUTH 4 TIMES DAILY 9 g 0   alclomethasone (ACLOVATE ) 0.05 % cream Apply topically 2 (two) times daily as needed (Rash). 180 g 3   amLODipine  (NORVASC ) 10 MG tablet Take 1 tablet by mouth once daily 90 tablet 0   aspirin  81 MG chewable tablet Chew 1 tablet (81 mg total) by mouth daily.     Blood Glucose Monitoring Suppl DEVI 1 each by Does not apply route in the morning, at noon, and at bedtime. May substitute to any manufacturer covered by patient's insurance. 1 each 0   cephALEXin  (KEFLEX ) 500 MG capsule Take 1 capsule (500 mg total) by mouth 2 (two) times daily. 14 capsule 0   chlorthalidone  (HYGROTON ) 25 MG tablet Take 1 tablet (25 mg total) by mouth daily. 90 tablet 3   clobetasol  (TEMOVATE ) 0.05 % external solution Apply 1 application topically 2 (two) times daily. 50 mL 6   dapagliflozin  propanediol (FARXIGA ) 5 MG TABS tablet Take 1 tablet (5 mg total) by mouth daily before breakfast. 30 tablet 2   DULoxetine  (CYMBALTA ) 60 MG capsule Take 1 capsule (60 mg total) by mouth daily. 30 capsule 2   Evolocumab  (REPATHA  SURECLICK) 140 MG/ML SOAJ INJECT 140 MG SUBCUTANEOUSLY EVERY 14 DAYS 6 mL 0   fluticasone  (FLONASE ) 50 MCG/ACT nasal spray Place 2 sprays into both nostrils daily. 16 g 1   furosemide  (LASIX ) 20 MG tablet Take 1 tablet (20 mg total) by mouth daily as needed (for shortness of breath or weight gain of 2 lbs overnight or 5 lbs in a week). 45 tablet 3   glucose blood (ACCU-CHEK GUIDE TEST) test strip USE 1  EACH BY IN VITRO ROUTE IN ETH MORNING, AT NOON, AND AT BEDTIME 100 each 2   isosorbide  mononitrate (IMDUR ) 60 MG 24 hr tablet TAKE 1 & 1/2 (ONE & ONE-HALF) TABLETS BY MOUTH ONCE DAILY 135 tablet 3   levothyroxine (SYNTHROID) 112 MCG tablet Take by mouth.     loratadine  (CLARITIN ) 10 MG tablet Take 1 tablet (10 mg total) by mouth daily. 10 tablet 0   meclizine  (ANTIVERT ) 12.5 MG tablet Take 1 tablet (12.5 mg total) by mouth 3 (three) times daily as needed for dizziness. 30 tablet 0   methimazole  (TAPAZOLE ) 5 MG tablet Take 5 mg daily 30 tablet 5   metoCLOPramide  (REGLAN ) 5 MG tablet TAKE 1 TABLET BY MOUTH THREE TIMES DAILY BEFORE MEALS. SCHEDULE WITH GI FOR FUTHER REFILLS 90 tablet 0   pantoprazole  (PROTONIX ) 40 MG tablet Take 1 tablet (40 mg total) by mouth daily. 30 tablet 5   potassium chloride  SA (KLOR-CON  M20) 20 MEQ tablet Take 2 tablets (40 mEq total) by mouth daily. 180 tablet 3   rosuvastatin  (CRESTOR ) 40 MG tablet Take 1 tablet by mouth once daily 90 tablet 0   SUMAtriptan  (IMITREX ) 50 MG tablet Take 50 mg by mouth daily as needed for migraine.     tretinoin  (RETIN-A ) 0.025 % cream Apply topically at bedtime. For the 1st month apply 2 nights weekly after one month increase to M-W-F nights, cut back usage to 1-2 nights weekly if you experience excessive dryness, 45 g 2   XIIDRA 5 % SOLN Apply 1  drop to eye 2 (two) times daily.     No facility-administered medications prior to visit.    Allergies  Allergen Reactions   Metformin  Anxiety    Vomiting, Anxiety, Cramping, Diarrhea   Ace Inhibitors Cough   Celebrex [Celecoxib] Other (See Comments)    "makes me bleed"   Diovan  [Valsartan ]     angioedema    ROS See HPI    Objective:    Physical Exam Constitutional:      Appearance: Normal appearance.  HENT:     Ears:     Comments: R tympanostomy tube in place Dull/scarred TM Cardiovascular:     Rate and Rhythm: Normal rate.  Pulmonary:     Effort: Pulmonary effort is normal.   Skin:    General: Skin is warm and dry.     Comments: Small firm abscess <1 cm wide left upper thigh.  Minimal fluctuance, non tender   Neurological:     Mental Status: She is alert.  Psychiatric:        Mood and Affect: Mood normal.        Behavior: Behavior normal.        Thought Content: Thought content normal.        Judgment: Judgment normal.      BP 113/61 (BP Location: Right Arm, Patient Position: Sitting, Cuff Size: Large)   Pulse 67   Temp 98.1 F (36.7 C) (Oral)   Resp 16   Ht 5\' 4"  (1.626 m)   Wt 222 lb (100.7 kg)   SpO2 98%   BMI 38.11 kg/m  Wt Readings from Last 3 Encounters:  10/25/23 222 lb (100.7 kg)  10/21/23 225 lb 9.6 oz (102.3 kg)  10/04/23 226 lb (102.5 kg)       Assessment & Plan:   Problem List Items Addressed This Visit       Unprioritized   Dysfunction of both eustachian tubes   S/p right tympanostomy tube yesterday. Hopefully this will help her hearing and vertigo symptoms.       Cutaneous abscess of left lower extremity - Primary   Improving with Keflex . - Continue Keflex . - Continue warm compresses. - Consider incision and drainage if no further improvement.         I am having [name] maintain her aspirin , loratadine , fluticasone , alclomethasone, clobetasol , albuterol , Xiidra, furosemide , isosorbide  mononitrate, pantoprazole , amLODipine , Repatha  SureClick, dapagliflozin  propanediol, Blood Glucose Monitoring Suppl, Accu-Chek Softclix Lancets, tretinoin , chlorthalidone , potassium chloride  SA, meclizine , levothyroxine, SUMAtriptan , DULoxetine , rosuvastatin , cephALEXin , Accu-Chek Guide Test, methimazole , and metoCLOPramide .  No orders of the defined types were placed in this encounter.

## 2023-10-25 NOTE — Assessment & Plan Note (Signed)
 S/p right tympanostomy tube yesterday. Hopefully this will help her hearing and vertigo symptoms.

## 2023-10-25 NOTE — Patient Instructions (Signed)
 VISIT SUMMARY:  Today, we addressed your boil on the right side and your dizziness. We also reviewed your diabetes management, which is stable.  YOUR PLAN:  BOIL ON RIGHT SIDE: You have a boil on your right side that is improving with the antibiotic Keflex . -Continue taking Keflex  as prescribed. -Apply warm compresses to the area. -If the boil does not improve further, we may need to consider incision and drainage.  TYPE 2 DIABETES MELLITUS: Your diabetes is well-controlled with your current blood glucose levels. -We will recheck your diabetes in one month to ensure it remains stable.

## 2023-10-29 ENCOUNTER — Encounter: Payer: Self-pay | Admitting: Neurology

## 2023-11-01 ENCOUNTER — Ambulatory Visit: Admitting: Physical Therapy

## 2023-11-01 DIAGNOSIS — G4733 Obstructive sleep apnea (adult) (pediatric): Secondary | ICD-10-CM | POA: Diagnosis not present

## 2023-11-07 ENCOUNTER — Other Ambulatory Visit: Payer: Self-pay | Admitting: Family

## 2023-11-08 ENCOUNTER — Ambulatory Visit

## 2023-11-08 ENCOUNTER — Encounter: Payer: Self-pay | Admitting: Family

## 2023-11-08 ENCOUNTER — Ambulatory Visit: Payer: Self-pay | Admitting: *Deleted

## 2023-11-08 DIAGNOSIS — M5416 Radiculopathy, lumbar region: Secondary | ICD-10-CM | POA: Diagnosis not present

## 2023-11-08 DIAGNOSIS — M62838 Other muscle spasm: Secondary | ICD-10-CM | POA: Diagnosis not present

## 2023-11-08 DIAGNOSIS — M6281 Muscle weakness (generalized): Secondary | ICD-10-CM | POA: Diagnosis not present

## 2023-11-08 DIAGNOSIS — I10 Essential (primary) hypertension: Secondary | ICD-10-CM | POA: Diagnosis not present

## 2023-11-08 NOTE — Therapy (Signed)
 OUTPATIENT PHYSICAL THERAPY THORACOLUMBAR TREATMENT   Patient Name: [name] MRN: [id number] DOB:[birth year], 63 y.o., female Today's Date: 11/08/2023  END OF SESSION:  PT End of Session - 11/08/23 0806     Visit Number 3    Date for PT Re-Evaluation 12/13/23    Authorization Type [employer] & Donelle Funk    PT Start Time 0801    PT Stop Time [phone]    PT Time Calculation (min) 43 min    Activity Tolerance Patient tolerated treatment well    Behavior During Therapy WFL for tasks assessed/performed               Past Medical History:  Diagnosis Date   Allergy    allergic rhinitis   Arthritis    Atypical chest pain    Constipation    Cyst, ovarian    Diabetes (HCC)    Diabetes mellitus, type II (HCC)    Elevated alkaline phosphatase level    Endometriosis    Esophageal stricture    Fatty liver    Gastroparesis    GERD (gastroesophageal reflux disease)    Heart murmur    Hepatic hemangioma    Hyperlipidemia    Hypertension    Hypertrophic condition of skin    acrokeratoelastoidosis- s/p 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 1/08- benign   Iron  deficiency anemia    Migraine headache    Non-compliance    Peptic ulcer disease    Thyroid  disease    Past Surgical History:  Procedure Laterality Date   ABDOMINAL EXPLORATION SURGERY     w/bso    BREAST BIOPSY     CARDIAC CATHETERIZATION  2009   mild non obstructive CAD   CHOLECYSTECTOMY     COLONOSCOPY     ESOPHAGEAL MANOMETRY N/A 08/01/2020   Procedure: ESOPHAGEAL MANOMETRY (EM);  Surgeon: Ace Holder, MD;  Location: [facility];  Service: Gastroenterology;  Laterality: N/A;   KNEE SURGERY  2005    left knee   LEFT HEART CATH AND CORONARY ANGIOGRAPHY N/A 07/17/2017   Procedure: LEFT HEART CATH AND CORONARY ANGIOGRAPHY;  Surgeon: [place], Peter M, MD;  Location: [facility] INVASIVE CV LAB;  Service: Cardiovascular;  Laterality: N/A;   LEFT HEART CATH AND CORONARY ANGIOGRAPHY N/A 09/01/2021   Procedure: LEFT HEART CATH  AND CORONARY ANGIOGRAPHY;  Surgeon: Odie Benne, MD;  Location: [facility] INVASIVE CV LAB;  Service: Cardiovascular;  Laterality: N/A;   POLYPECTOMY     TOTAL ABDOMINAL HYSTERECTOMY     UPPER GASTROINTESTINAL ENDOSCOPY     Patient Active Problem List   Diagnosis Date Noted   Cutaneous abscess of left lower extremity 10/25/2023   Unilateral primary osteoarthritis, left knee 10/04/2023   Type 2 diabetes mellitus with hyperglycemia, without long-term current use of insulin  (HCC) 08/16/2023   Dysfunction of both eustachian tubes 08/16/2023   Positive ANA (antinuclear antibody) 03/22/2023   Sedimentation rate elevation 03/22/2023   Bulging of lumbar intervertebral disc 09/24/2022   Left arm pain 08/14/2022   Arthritis 05/11/2022   Controlled type 2 diabetes mellitus without complication, without long-term current use of insulin  (HCC) 12/01/2021   Coronary artery disease of native heart with stable angina pectoris (HCC) 06/30/2021   Intertrigo 05/26/2021   Hypercontractile esophagus    Colitis 02/04/2018   Aortic atherosclerosis (HCC) 07/17/2017   Insomnia 01/12/2017   Hyperthyroidism 02/29/2012   Esophageal dysphagia 04/18/2011   Ulnar neuropathy 03/30/2011   Lumbar radiculopathy 10/18/2010   Fibromyalgia 07/20/2009   Hyperlipidemia 05/26/2009   Vitamin  D deficiency 05/23/2009   Polyarthralgia 04/12/2009   Iron  deficiency anemia 10/25/2008   GERD 10/25/2008   LIVER HEMANGIOMA 01/01/2008   ESOPHAGEAL STRICTURE 01/01/2008   Gastroparesis 01/01/2008   Anemia 12/08/2007   Allergic rhinitis 12/08/2007   LUQ abdominal pain 09/23/2007   Migraine headache 08/11/2007   HIATAL HERNIA 08/11/2007   OVARIAN CYST 08/11/2007   Essential hypertension 05/06/2007    PCP: Dorrene Gaucher, NP  REFERRING PROVIDER: Elna Haggis, MD  REFERRING DIAG: M54.16 (ICD-10-CM) - Radiculopathy, lumbar region   RATIONALE FOR EVALUATION AND TREATMENT: Rehabilitation  THERAPY DIAG:   Radiculopathy, lumbar region  Muscle weakness (generalized)  Other muscle spasm  ONSET DATE: > 1 year  NEXT MD VISIT: 10/25/23  SUBJECTIVE:                                                                                                                                                                                           SUBJECTIVE STATEMENT: Pt reports she is doing pretty good,   PERTINENT HISTORY:  Arthritis, DM-II, Endometriosis, GERD, HTN, heart murmur, iron  deficiency anemia, migraine headaches, fibromyalgia  PAIN:  Are you having pain? Yes: NPRS scale: 5/10 currently, 10/10 worst Pain location: R lower back Pain description: sharp when moving, achy when resting Aggravating factors: Stand >1hr, walking 2-3 mi Relieving factors: Heat, massaging  PRECAUTIONS: None  RED FLAGS: None   WEIGHT BEARING RESTRICTIONS: No  FALLS:  Has patient fallen in last 6 months? No  LIVING ENVIRONMENT: Lives with: lives alone Lives in: House/apartment Stairs: Yes: External: 6 steps; none Has following equipment at home: None  OCCUPATION: Retired  PLOF: Independent and Leisure: Had to quit work, gym, exercise  PATIENT GOALS: Not to have to the pain   OBJECTIVE:  Note: Objective measures were completed at Evaluation unless otherwise noted.  DIAGNOSTIC FINDINGS:  02/28/23 CLINICAL DATA:  Acute left-sided low back pain with left-sided sciatica. Left hip pain.   EXAM: LUMBAR SPINE - COMPLETE 4+ VIEW   COMPARISON:  Radiographs 04/07/2021   FINDINGS: Five non-rib-bearing lumbar vertebra. Lumbar alignment is normal. Vertebral body heights are normal. Anterior spurring at multiple levels most prominent at L5-S1. The disc spaces are preserved. No evidence of focal bone lesion, fracture or pars defects. Aortic atherosclerosis.   IMPRESSION: Mild spondylosis with anterior spurring most prominent at L5-S1.  PATIENT SURVEYS:  Modified Oswestry 14/50 = 28%    COGNITION: Overall cognitive status: Within functional limits for tasks assessed     SENSATION: Proprioception: WFL, standing for too long gets numbness  MUSCLE LENGTH: Quad: R=L severe tightness HS: mod tight B ITB: R mild tightness  POSTURE: rounded  shoulders, forward head, flexed trunk , and weight shift right  PALPATION: TTP in L5-S1, R QL, upper glutes  LUMBAR ROM:   AROM eval  Flexion Above ankles, p!  Extension Limited, p!  Right lateral flexion Lateral knee  Left lateral flexion Lateral knee, p! R side  Right rotation WFL  Left rotation WFL   (Blank rows = not tested)  LOWER EXTREMITY MMT:    MMT Right eval Left eval  Hip flexion 5 4-  Hip extension 4- 4  Hip abduction 4- 4  Hip adduction 3+ sharp p! 4  Hip internal rotation 3+p! sharp 4-  Hip external rotation 3+sharp p! 4-  Knee flexion 4- 4  Knee extension 4- 4  Ankle dorsiflexion 4- p! 5  Ankle plantarflexion    Ankle inversion    Ankle eversion     (Blank rows = not tested)  LUMBAR SPECIAL TESTS:  Straight leg raise test: Positive R  GAIT: Distance walked: Front desk to treatment room 1 Assistive device utilized: None Level of assistance: Complete Independence Comments: Shifts weight to R to alleviate pain  TREATMENT DATE:                                                                                                                               11/08/23 THERAPEUTIC EXERCISE: To improve strength, endurance, ROM, and flexibility.  Demonstration, verbal and tactile cues throughout for technique. Nustep L5x71min UE/LE Reviewed and updated HEP NEUROMUSCULAR RE-EDUCATION: To improve proprioception, balance, and kinesthesia. Standing rows RTB 2 x 10  Standing shoulder ext RTB 2x10 Pallof press standing RTB 2 x 10 B Seated small trunk twist yellow wt ball x 10 B Seated shoulder flexion yellow wt ball 2 x 10 Bridges 10x3" no hands Supine hip ABD RTB + TrA 2 x 10  10/25/23 THERAPEUTIC EXERCISE:  To improve strength, endurance, ROM, and flexibility.  Demonstration, verbal and tactile cues throughout for technique. Nustep L5x52min UE/LE Supine HS stretch x 30" B Supine KTOS X 30" B Supine trunk rotation x 5 x 3-5" B Modified thomas stretch x 30" B Orange pball rollout flexion x 10 NEUROMUSCULAR RE-EDUCATION: To improve proprioception, balance, and kinesthesia. Ab sets with orange pball 10x5" Ball squeeze with ab sets 10x5" Ab set with bent knee fallout RTB x 10 Seated rows  and shoulder extension RTB x 10- cues to avoid scap elevation and for retraction  10/18/23 THERAPEUTIC EXERCISE: To improve strength, endurance, ROM, and flexibility.  Demonstration, verbal and tactile cues throughout for technique. Supine HS stretch x 30" B Supine KTOS X 30" B Supine trunk rotation x 10 x 3-5" B Modified thomas stretch x 30"   PATIENT EDUCATION:  Education details: HEP update - bent knee fallout RTB and hip ball squeeze Person educated: Patient Education method: Explanation, Demonstration, Tactile cues, Verbal cues, and Handouts Education comprehension: verbalized understanding, returned demonstration, verbal cues required, tactile cues required, and needs further education  HOME EXERCISE PROGRAM:  Access Code: [email or web address] URL: [email or web address] Date: 11/08/2023 Prepared by: Lateesha Bezold  Exercises - Supine Hamstring Stretch with Strap  - 1 x daily - 7 x weekly - 2 sets - 5 reps - 30 hold - Supine Piriformis Stretch with Leg Straight  - 2 x daily - 7 x weekly - 2 sets - 30 sec hold - Supine Lower Trunk Rotation  - 1 x daily - 7 x weekly - 2 sets - 10 reps - 3-5 hold - Modified Thomas Stretch  - 1 x daily - 7 x weekly - 2 sets - 30 hold - Supine Hip Adduction Isometric with Ball  - 1 x daily - 7 x weekly - 2 sets - 10 reps - 5 sec hold - Hooklying Clamshell with Resistance  - 1 x daily - 7 x weekly - 2 sets - 10 reps - 3 sec hold - Standing Shoulder Row with Anchored  Resistance  - 1 x daily - 7 x weekly - 2 sets - 10 reps - Shoulder Extension with Anchored Resistance  - 1 x daily - 7 x weekly - 2 sets - 10 reps  ASSESSMENT:  CLINICAL IMPRESSION: Progressed through core, proximal LE strength and postural strength. Pt needs cues to retract her shoulder blades with the rows. Slight back pain with the bridges.  Good tolerance for treatment overall, no increased pain.   IE: Aayra is a 63 y.o. female who was seen today for physical therapy evaluation and treatment for LBP with radiculopathy. She reports LBP began several years ago after a fall. Has previous PT experience with having an episode of LBP with radiculopathy back in 2019. Pain is worse with movement and bending over. She has issues with coming back up from bending, having to crawl up her body to stand up straight. She has a tendency to shift her weight to R side to alleviate pain when sitting or standing. The pain interferes with her ability to work and participate in extracurricular activities that she enjoys. Raedyn is limited lumbar ROM and shows deficits in LE strength, abnormal posture and TTP with anormal muscle tension around L5-S1 paraspinals, R upper glutes, and R QL. On Modified Oswestry, she scored 14/50 demonstrating 28% or moderate disability. Berdean will benefit from skilled PT to address above deficits to improve mobility and activity tolerance with decreased pain interference.    OBJECTIVE IMPAIRMENTS: Abnormal gait, decreased activity tolerance, decreased coordination, decreased endurance, decreased knowledge of condition, decreased mobility, difficulty walking, decreased ROM, decreased strength, increased fascial restrictions, impaired perceived functional ability, increased muscle spasms, impaired flexibility, impaired sensation, impaired tone, improper body mechanics, postural dysfunction, and pain.   ACTIVITY LIMITATIONS: lifting, bending, sitting, standing, sleeping, and locomotion  level  PARTICIPATION LIMITATIONS: meal prep, cleaning, laundry, interpersonal relationship, community activity, and occupation  PERSONAL FACTORS: Past/current experiences, Time since onset of injury/illness/exacerbation, and 3+ comorbidities: Arthritis, DM-II, Endometriosis, GERD, HTN, heart murmur, iron  deficiency anemia, migraine headaches, fibromyalgia are also affecting patient's functional outcome.   REHAB POTENTIAL: Good  CLINICAL DECISION MAKING: Evolving/moderate complexity  EVALUATION COMPLEXITY: Moderate   GOALS: Goals reviewed with patient? Yes  SHORT TERM GOALS: Target date: 11/15/2023  1.  Pt will be independent with initial HEP to improve outcomes and carryover Baseline:  Goal status: INITIAL  2.  Pt will report 25% improvement in low back pain to improve QOL Baseline:  Goal status: MET- 11/08/23  LONG TERM GOALS: Target date: 12/13/2023  1.  Pt will be independent with ongoing/advanced HEP for  self-management at home Baseline:  Goal status: INITIAL  2.  Pt will report 50-75% improvement in low back pain to improve QOL  Baseline: 5/10 currently, 10/10 worst Goal status: INITIAL  3.  Pt will demonstrate improved LE strength to >/= 4+/5 for improved stability and ease of mobility  Baseline: Refer to LE MMT Goal status: INITIAL  4.  Pt will report </= 2/50 on Modified Oswestry (MCID = 12%) to demonstrate improved functional ability with decreased pain interference Baseline: 14/50 = 28% Goal status: INITIAL  5.  Pt to demonstrate ability to achieve and maintain good spinal alignment/posturing and body mechanics needed for daily activities  Baseline:  Goal status: INITIAL   PLAN:  PT FREQUENCY: 1-2x/week  PT DURATION: 8 weeks  PLANNED INTERVENTIONS: 97164- PT Re-evaluation, 97110-Therapeutic exercises, 97530- Therapeutic activity, 97112- Neuromuscular re-education, 97535- Self Care, 30865- Manual therapy, [id number]- Aquatic Therapy, H8469- Electrical  stimulation (unattended), 97016- Vasopneumatic device, [id number]- Ultrasound, [id number]- Traction (mechanical), [id number]- Ionotophoresis 4mg /ml Dexamethasone , Patient/Family education, Taping, Dry Needling, Joint mobilization, Spinal manipulation, Spinal mobilization, Cryotherapy, and Moist heat.  PLAN FOR NEXT SESSION: assess STG #1; progress lumbopelvic flexibility & ROM; introduce LE/core strengthening exercises; MT +/- TPDN around L5/S1, QL, upper glutes   Dencil Cayson L Jahden Schara, PTA 11/08/2023, 8:45 AM

## 2023-11-11 ENCOUNTER — Other Ambulatory Visit: Payer: Self-pay | Admitting: Internal Medicine

## 2023-11-11 ENCOUNTER — Other Ambulatory Visit: Payer: Self-pay | Admitting: Family Medicine

## 2023-11-11 ENCOUNTER — Telehealth: Payer: Self-pay | Admitting: Pharmacy Technician

## 2023-11-11 DIAGNOSIS — E785 Hyperlipidemia, unspecified: Secondary | ICD-10-CM

## 2023-11-11 DIAGNOSIS — E1165 Type 2 diabetes mellitus with hyperglycemia: Secondary | ICD-10-CM

## 2023-11-11 DIAGNOSIS — I2510 Atherosclerotic heart disease of native coronary artery without angina pectoris: Secondary | ICD-10-CM

## 2023-11-11 NOTE — Telephone Encounter (Signed)
 Pharmacy Patient Advocate Encounter   Received notification from CoverMyMeds that prior authorization for repatha  is required/requested.   Insurance verification completed.   The patient is insured through [facility] .   Per test claim: PA required; PA submitted to above mentioned insurance via CoverMyMeds Key/confirmation #/EOC [id number] Status is pending

## 2023-11-12 ENCOUNTER — Telehealth: Payer: Self-pay | Admitting: Internal Medicine

## 2023-11-12 ENCOUNTER — Telehealth: Payer: Self-pay

## 2023-11-12 NOTE — Telephone Encounter (Signed)
  Alexandria Angel, PA-C 10/22/2023  7:57 AM EDT     Please let Ms. Mulkern know her labs were stable. They do continue to show a low MCV value which can be seen in the setting of iron  deficiency disorders. It looks like she has a history of iron  deficiency and saw heme-onc several years ago and was receiving iron  infusions for a while. She was lost to follow-up after 2021. I would recommend she connect with their office to discuss whether this needs to be revisited, as this can contribute to generalized fatigue and dizziness.    Reviewed the above results and recommendations from Dayna Dunn, [place] with pt who will follow up Dr Birt Bulla office.  She was appreciative of the call back and information.

## 2023-11-12 NOTE — Telephone Encounter (Signed)
 Level look stable. Please keep follow up visit in June.

## 2023-11-12 NOTE — Telephone Encounter (Signed)
Patient is returning call to discuss lab results. 

## 2023-11-12 NOTE — Telephone Encounter (Signed)
 Pharmacy Patient Advocate Encounter  Received notification from [facility] that Prior Authorization for repatha  has been APPROVED from 11/11/23 to 11/10/24. Spoke to pharmacy to process.Copay is $pharmacy was closed. I called the patient to make her aware .    PA #/Case ID/Reference #: [id number]

## 2023-11-12 NOTE — Telephone Encounter (Signed)
 Copied from CRM [id number]. Topic: Clinical - Medical Advice >> Nov 12, 2023 12:25 PM Albertha Alosa wrote: Reason for CRM: Patient called in stating iron  was low, doesn't know the number but needed to let Melissa know about it to see if she needs to come in sooner

## 2023-11-13 ENCOUNTER — Telehealth: Payer: Self-pay | Admitting: Family

## 2023-11-13 NOTE — Telephone Encounter (Signed)
 Copied from CRM [id number]. Topic: General - Call Back - No Documentation >> Nov 13, 2023  9:14 AM Kita Perish H wrote: Reason for CRM: Patient states she's returning a call to Henry Loge at the office from today, no messages or notes.  Amyla [phone]

## 2023-11-13 NOTE — Telephone Encounter (Signed)
 Called patient , there was a message  on patient's calls about her results.

## 2023-11-15 ENCOUNTER — Encounter: Payer: Self-pay | Admitting: Physical Therapy

## 2023-11-15 ENCOUNTER — Ambulatory Visit: Admitting: Physical Therapy

## 2023-11-15 DIAGNOSIS — M6281 Muscle weakness (generalized): Secondary | ICD-10-CM | POA: Diagnosis not present

## 2023-11-15 DIAGNOSIS — M5416 Radiculopathy, lumbar region: Secondary | ICD-10-CM | POA: Diagnosis not present

## 2023-11-15 DIAGNOSIS — M62838 Other muscle spasm: Secondary | ICD-10-CM | POA: Diagnosis not present

## 2023-11-15 NOTE — Therapy (Signed)
 OUTPATIENT PHYSICAL THERAPY THORACOLUMBAR TREATMENT   Patient Name: SIRIYAH AMBROSIUS MRN: [id number] DOB:12/29/1960, 63 y.o., female Today's Date: 11/15/2023  END OF SESSION:  PT End of Session - 11/15/23 0802     Visit Number 4    Date for PT Re-Evaluation 12/13/23    Authorization Type [employer] & Donelle Funk    PT Start Time 0802    PT Stop Time [phone]    PT Time Calculation (min) 44 min    Activity Tolerance Patient tolerated treatment well    Behavior During Therapy WFL for tasks assessed/performed                Past Medical History:  Diagnosis Date   Allergy    allergic rhinitis   Arthritis    Atypical chest pain    Constipation    Cyst, ovarian    Diabetes (HCC)    Diabetes mellitus, type II (HCC)    Elevated alkaline phosphatase level    Endometriosis    Esophageal stricture    Fatty liver    Gastroparesis    GERD (gastroesophageal reflux disease)    Heart murmur    Hepatic hemangioma    Hyperlipidemia    Hypertension    Hypertrophic condition of skin    acrokeratoelastoidosis- s/p 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 1/08- benign   Iron  deficiency anemia    Migraine headache    Non-compliance    Peptic ulcer disease    Thyroid  disease    Past Surgical History:  Procedure Laterality Date   ABDOMINAL EXPLORATION SURGERY     w/bso    BREAST BIOPSY     CARDIAC CATHETERIZATION  2009   mild non obstructive CAD   CHOLECYSTECTOMY     COLONOSCOPY     ESOPHAGEAL MANOMETRY N/A 08/01/2020   Procedure: ESOPHAGEAL MANOMETRY (EM);  Surgeon: Ace Holder, MD;  Location: [facility];  Service: Gastroenterology;  Laterality: N/A;   KNEE SURGERY  2005    left knee   LEFT HEART CATH AND CORONARY ANGIOGRAPHY N/A 07/17/2017   Procedure: LEFT HEART CATH AND CORONARY ANGIOGRAPHY;  Surgeon: [place], Peter M, MD;  Location: [facility] CV [facility];  Service: Cardiovascular;  Laterality: N/A;   LEFT HEART CATH AND CORONARY ANGIOGRAPHY N/A 09/01/2021   Procedure: LEFT HEART CATH  AND CORONARY ANGIOGRAPHY;  Surgeon: Odie Benne, MD;  Location: [facility]AS[facility] CV LAB;  Service: Cardiovascular;  Laterality: N/A;   POLYPECTOMY     TOTAL ABDOMINAL HYSTERECTOMY     UPPER GASTROINTESTINAL ENDOSCOPY     Patient Active Problem List   Diagnosis Date Noted   Cutaneous abscess of left lower extremity 10/25/2023   Unilateral primary osteoarthritis, left knee 10/04/2023   Type 2 diabetes mellitus with hyperglycemia, without long-term current use of insulin  (HCC) 08/16/2023   Dysfunction of both eustachian tubes 08/16/2023   Positive ANA (antinuclear antibody) 03/22/2023   Sedimentation rate elevation 03/22/2023   Bulging of lumbar intervertebral disc 09/24/2022   Left arm pain 08/14/2022   Arthritis 05/11/2022   Controlled type 2 diabetes mellitus without complication, without long-term current use of insulin  (HCC) 12/01/2021   Coronary artery disease of native heart with stable angina pectoris (HCC) 06/30/2021   Intertrigo 05/26/2021   Hypercontractile esophagus    Colitis 02/04/2018   Aortic atherosclerosis (HCC) 07/17/2017   Insomnia 01/12/2017   Hyperthyroidism 02/29/2012   Esophageal dysphagia 04/18/2011   Ulnar neuropathy 03/30/2011   Lumbar radiculopathy 10/18/2010   Fibromyalgia 07/20/2009   Hyperlipidemia 05/26/2009  Vitamin D  deficiency 05/23/2009   Polyarthralgia 04/12/2009   Iron  deficiency anemia 10/25/2008   GERD 10/25/2008   LIVER HEMANGIOMA 01/01/2008   ESOPHAGEAL STRICTURE 01/01/2008   Gastroparesis 01/01/2008   Anemia 12/08/2007   Allergic rhinitis 12/08/2007   LUQ abdominal pain 09/23/2007   Migraine headache 08/11/2007   HIATAL HERNIA 08/11/2007   OVARIAN CYST 08/11/2007   Essential hypertension 05/06/2007    PCP: Dorrene Gaucher, NP  REFERRING PROVIDER: Elna Haggis, MD  REFERRING DIAG: M54.16 (ICD-10-CM) - Radiculopathy, lumbar region   RATIONALE FOR EVALUATION AND TREATMENT: Rehabilitation  THERAPY DIAG:   Radiculopathy, lumbar region  Muscle weakness (generalized)  Other muscle spasm  ONSET DATE: > 1 year  NEXT MD VISIT: 10/25/23  SUBJECTIVE:                                                                                                                                                                                           SUBJECTIVE STATEMENT: Pt denies pain today. HEP going well.  PERTINENT HISTORY:  Arthritis, DM-II, Endometriosis, GERD, HTN, heart murmur, iron  deficiency anemia, migraine headaches, fibromyalgia  PAIN:  Are you having pain? No and Yes: NPRS scale: 0/10  Pain location: R lower back Pain description: sharp when moving, achy when resting Aggravating factors: Stand >1hr, walking 2-3 mi Relieving factors: Heat, massaging  PRECAUTIONS: None  RED FLAGS: None   WEIGHT BEARING RESTRICTIONS: No  FALLS:  Has patient fallen in last 6 months? No  LIVING ENVIRONMENT: Lives with: lives alone Lives in: House/apartment Stairs: Yes: External: 6 steps; none Has following equipment at home: None  OCCUPATION: Retired  PLOF: Independent and Leisure: Had to quit work, gym, exercise  PATIENT GOALS: Not to have to the pain   OBJECTIVE:  Note: Objective measures were completed at Evaluation unless otherwise noted.  DIAGNOSTIC FINDINGS:  02/28/23 CLINICAL DATA:  Acute left-sided low back pain with left-sided sciatica. Left hip pain.   EXAM: LUMBAR SPINE - COMPLETE 4+ VIEW   COMPARISON:  Radiographs 04/07/2021   FINDINGS: Five non-rib-bearing lumbar vertebra. Lumbar alignment is normal. Vertebral body heights are normal. Anterior spurring at multiple levels most prominent at L5-S1. The disc spaces are preserved. No evidence of focal bone lesion, fracture or pars defects. Aortic atherosclerosis.   IMPRESSION: Mild spondylosis with anterior spurring most prominent at L5-S1.  PATIENT SURVEYS:  Modified Oswestry 14/50 = 28%   COGNITION: Overall cognitive  status: Within functional limits for tasks assessed     SENSATION: Proprioception: WFL, standing for too long gets numbness  MUSCLE LENGTH: Quad: R=L severe tightness HS: mod tight B ITB: R mild tightness  POSTURE: rounded  shoulders, forward head, flexed trunk , and weight shift right  PALPATION: TTP in L5-S1, R QL, upper glutes  LUMBAR ROM:   AROM eval  Flexion Above ankles, p!  Extension Limited, p!  Right lateral flexion Lateral knee  Left lateral flexion Lateral knee, p! R side  Right rotation WFL  Left rotation WFL   (Blank rows = not tested)  LOWER EXTREMITY MMT:    MMT Right eval Left eval  Hip flexion 5 4-  Hip extension 4- 4  Hip abduction 4- 4  Hip adduction 3+ sharp p! 4  Hip internal rotation 3+ sharp p! 4-  Hip external rotation 3+ sharp p! 4-  Knee flexion 4- 4  Knee extension 4- 4  Ankle dorsiflexion 4- p! 5  Ankle plantarflexion    Ankle inversion    Ankle eversion     (Blank rows = not tested)  LUMBAR SPECIAL TESTS:  Straight leg raise test: Positive R  GAIT: Distance walked: Front desk to treatment room 1 Assistive device utilized: None Level of assistance: Complete Independence Comments: Shifts weight to R to alleviate pain  TREATMENT DATE:                                                                                                                                11/15/23 THERAPEUTIC EXERCISE: To improve strength and endurance.  Demonstration, verbal and tactile cues throughout for technique.  NuStep L5 x 6 min UE/LE  SELF CARE: Provided education to facilitate performance of basic household cleaning/chores.  Provided education in proper posture and body mechanics for typical daily positioning and household chores to minimize strain on low back and neck.  NEUROMUSCULAR RE-EDUCATION: To improve coordination, kinesthesia, posture, and proprioception. Hooklying TrA + GTB B hip ABD/ER clam x 10 Bridge + GTB hip ABD isometric 10 x 5" S/L  GTB clam 2 x 10 bil S/L RTB reverse clam x 10 bil Bridge + GTB clam x 10 - cues to avoid dropping hips  Quadruped cat/cow x 10 Quadruped alt hip extension x 10 - cues to avoid rotating hips or arching back (maintain level back)   11/08/23 THERAPEUTIC EXERCISE: To improve strength, endurance, ROM, and flexibility.  Demonstration, verbal and tactile cues throughout for technique. Nustep L5x77min UE/LE Reviewed and updated HEP NEUROMUSCULAR RE-EDUCATION: To improve proprioception, balance, and kinesthesia. Standing rows RTB 2 x 10  Standing shoulder ext RTB 2x10 Pallof press standing RTB 2 x 10 B Seated small trunk twist yellow wt ball x 10 B Seated shoulder flexion yellow wt ball 2 x 10 Bridges 10x3" no hands Supine hip ABD RTB + TrA 2 x 10   10/25/23 THERAPEUTIC EXERCISE: To improve strength, endurance, ROM, and flexibility.  Demonstration, verbal and tactile cues throughout for technique. Nustep L5x33min UE/LE Supine HS stretch x 30" B Supine KTOS X 30" B Supine trunk rotation x 5 x 3-5" B Modified thomas stretch x 30" B Orange pball rollout  flexion x 10 NEUROMUSCULAR RE-EDUCATION: To improve proprioception, balance, and kinesthesia. Ab sets with orange pball 10x5" Ball squeeze with ab sets 10x5" Ab set with bent knee fallout RTB x 10 Seated rows  and shoulder extension RTB x 10- cues to avoid scap elevation and for retraction  10/18/23 THERAPEUTIC EXERCISE: To improve strength, endurance, ROM, and flexibility.  Demonstration, verbal and tactile cues throughout for technique. Supine HS stretch x 30" B Supine KTOS X 30" B Supine trunk rotation x 10 x 3-5" B Modified thomas stretch x 30"   PATIENT EDUCATION:  Education details: HEP update - bent knee fallout RTB and hip ball squeeze Person educated: Patient Education method: Explanation, Demonstration, Tactile cues, Verbal cues, and Handouts Education comprehension: verbalized understanding, returned demonstration, verbal cues  required, tactile cues required, and needs further education  HOME EXERCISE PROGRAM: Access Code: [email or web address] URL: [email or web address] Date: 11/15/2023 Prepared by: Felecia Hopper  Exercises - Supine Hamstring Stretch with Strap  - 1 x daily - 7 x weekly - 2 sets - 5 reps - 30 hold - Supine Piriformis Stretch with Leg Straight  - 2 x daily - 7 x weekly - 2 sets - 30 sec hold - Supine Lower Trunk Rotation  - 1 x daily - 7 x weekly - 2 sets - 10 reps - 3-5 hold - Modified Thomas Stretch  - 1 x daily - 7 x weekly - 2 sets - 30 hold - Supine Hip Adduction Isometric with Ball  - 1 x daily - 7 x weekly - 2 sets - 10 reps - 5 sec hold - Hooklying Clamshell with Resistance  - 1 x daily - 7 x weekly - 2 sets - 10 reps - 3 sec hold - Bridge with Resistance  - 1 x daily - 3 x weekly - 2 sets - 10 reps - 5 sec hold - Clam with Resistance  - 1 x daily - 3 x weekly - 2 sets - 10 reps - 3-5 sec hold - Sidelying Reverse Clamshell with Resistance  - 1 x daily - 3 x weekly - 2 sets - 10 reps - 3-5 sec hold - Quadruped Cat Cow  - 1 x daily - 3 x weekly - 2 sets - 10 reps - 3 sec hold - Standing Shoulder Row with Anchored Resistance  - 1 x daily - 7 x weekly - 2 sets - 10 reps - Shoulder Extension with Anchored Resistance  - 1 x daily - 7 x weekly - 2 sets - 10 reps  Patient Education - Posture and Body Mechanics  ASSESSMENT:  CLINICAL IMPRESSION: Danie reports her pain has been "about gone" since she has been doing the stretches - only occasional pain if she stands too long.  Provided education for proper posture and body mechanics for typical daily positioning including standing for household chores to minimize strain on low back.  Progressed core and lumbopelvic mobility and strengthening with good tolerance for most exercises other than fatigue noted, updating HEP accordingly.  Rozalyn will benefit from continued skilled PT to address ongoing strength deficits to improve mobility and  activity tolerance with decreased pain interference.   IE: Jamaria is a 63 y.o. female who was seen today for physical therapy evaluation and treatment for LBP with radiculopathy. She reports LBP began several years ago after a fall. Has previous PT experience with having an episode of LBP with radiculopathy back in 2019. Pain is worse with movement and bending over. She has issues with  coming back up from bending, having to crawl up her body to stand up straight. She has a tendency to shift her weight to R side to alleviate pain when sitting or standing. The pain interferes with her ability to work and participate in extracurricular activities that she enjoys. Kennedee is limited lumbar ROM and shows deficits in LE strength, abnormal posture and TTP with anormal muscle tension around L5-S1 paraspinals, R upper glutes, and R QL. On Modified Oswestry, she scored 14/50 demonstrating 28% or moderate disability. Shardee will benefit from skilled PT to address above deficits to improve mobility and activity tolerance with decreased pain interference.    OBJECTIVE IMPAIRMENTS: Abnormal gait, decreased activity tolerance, decreased coordination, decreased endurance, decreased knowledge of condition, decreased mobility, difficulty walking, decreased ROM, decreased strength, increased fascial restrictions, impaired perceived functional ability, increased muscle spasms, impaired flexibility, impaired sensation, impaired tone, improper body mechanics, postural dysfunction, and pain.   ACTIVITY LIMITATIONS: lifting, bending, sitting, standing, sleeping, and locomotion level  PARTICIPATION LIMITATIONS: meal prep, cleaning, laundry, interpersonal relationship, community activity, and occupation  PERSONAL FACTORS: Past/current experiences, Time since onset of injury/illness/exacerbation, and 3+ comorbidities: Arthritis, DM-II, Endometriosis, GERD, HTN, heart murmur, iron  deficiency anemia, migraine headaches, fibromyalgia  are also affecting patient's functional outcome.   REHAB POTENTIAL: Good  CLINICAL DECISION MAKING: Evolving/moderate complexity  EVALUATION COMPLEXITY: Moderate   GOALS: Goals reviewed with patient? Yes  SHORT TERM GOALS: Target date: 11/15/2023  1.  Pt will be independent with initial HEP to improve outcomes and carryover Baseline:  Goal status: MET - 11/15/23  2.  Pt will report 25% improvement in low back pain to improve QOL Baseline:  Goal status: MET - 11/08/23  LONG TERM GOALS: Target date: 12/13/2023  1.  Pt will be independent with ongoing/advanced HEP for self-management at home Baseline:  Goal status: IN PROGRESS - 11/15/23 - met for current HEP  2.  Pt will report 50-75% improvement in low back pain to improve QOL  Baseline: 5/10 currently, 10/10 worst Goal status: MET - 11/15/23 - almost 100% improvement  3.  Pt will demonstrate improved LE strength to >/= 4+/5 for improved stability and ease of mobility  Baseline: Refer to LE MMT Goal status: INITIAL  4.  Pt will report </= 2/50 on Modified Oswestry (MCID = 12%) to demonstrate improved functional ability with decreased pain interference Baseline: 14/50 = 28% Goal status: INITIAL  5.  Pt to demonstrate ability to achieve and maintain good spinal alignment/posturing and body mechanics needed for daily activities  Baseline:  Goal status: IN PROGRESS - 11/15/23 - provided education in proper posture and body mechanics for typical daily positioning and household chores to minimize strain on low back.    PLAN:  PT FREQUENCY: 1-2x/week  PT DURATION: 8 weeks  PLANNED INTERVENTIONS: 97164- PT Re-evaluation, 97110-Therapeutic exercises, 97530- Therapeutic activity, 97112- Neuromuscular re-education, 97535- Self Care, 10960- Manual therapy, [phone]- Aquatic Therapy, [phone]- Electrical stimulation (unattended), 97016- Vasopneumatic device, [id number]- Ultrasound, [id number]- Traction (mechanical), [id number]- Ionotophoresis 4mg /ml  Dexamethasone , Patient/Family education, Taping, Dry Needling, Joint mobilization, Spinal manipulation, Spinal mobilization, Cryotherapy, and Moist heat.  PLAN FOR NEXT SESSION: progress lumbopelvic flexibility & ROM; progress LE/core strengthening exercises; MT +/- TPDN around L5/S1, QL, upper glutes; review posture and body mechanics PRN   Francisco Irving, PT 11/15/2023, 8:48 AM

## 2023-11-19 ENCOUNTER — Telehealth (HOSPITAL_COMMUNITY): Payer: Self-pay | Admitting: *Deleted

## 2023-11-19 NOTE — Telephone Encounter (Signed)
 Attempted to call patient regarding upcoming cardiac CT appointment. Left message on voicemail with name and callback number  Chase Copping RN Navigator Cardiac Imaging [facility] [phone] Office [phone] Cell  Reminder to avoid caffeine and Imdur  prior to test.

## 2023-11-20 ENCOUNTER — Encounter: Payer: Self-pay | Admitting: Cardiology

## 2023-11-20 ENCOUNTER — Encounter (HOSPITAL_COMMUNITY)
Admission: RE | Admit: 2023-11-20 | Discharge: 2023-11-20 | Disposition: A | Discharge status: Home / Self Care | Source: Ambulatory Visit | Attending: Physician Assistant | Admitting: Physician Assistant

## 2023-11-20 ENCOUNTER — Ambulatory Visit: Payer: Self-pay | Admitting: Cardiology

## 2023-11-20 DIAGNOSIS — I2510 Atherosclerotic heart disease of native coronary artery without angina pectoris: Secondary | ICD-10-CM | POA: Insufficient documentation

## 2023-11-20 DIAGNOSIS — I10 Essential (primary) hypertension: Secondary | ICD-10-CM | POA: Diagnosis not present

## 2023-11-20 DIAGNOSIS — I6523 Occlusion and stenosis of bilateral carotid arteries: Secondary | ICD-10-CM | POA: Insufficient documentation

## 2023-11-20 DIAGNOSIS — G4730 Sleep apnea, unspecified: Secondary | ICD-10-CM | POA: Diagnosis not present

## 2023-11-20 DIAGNOSIS — E785 Hyperlipidemia, unspecified: Secondary | ICD-10-CM | POA: Insufficient documentation

## 2023-11-20 DIAGNOSIS — R072 Precordial pain: Secondary | ICD-10-CM | POA: Insufficient documentation

## 2023-11-20 LAB — NM PET CT CARDIAC PERFUSION MULTI W/ABSOLUTE BLOODFLOW
MBFR: 2.24
Nuc Rest EF: 59 %
Nuc Stress EF: 68 %
Rest MBF: 0.96 ml/g/min
Rest Nuclear Isotope Dose: 26.4 mCi
ST Depression (mm): 0 mm
Stress MBF: 2.15 ml/g/min
Stress Nuclear Isotope Dose: 26.6 mCi
TID: 1.02

## 2023-11-20 MED ORDER — REGADENOSON 0.4 MG/5ML IV SOLN
0.4000 mg | Freq: Once | INTRAVENOUS | Status: AC
  Administered 2023-11-20: 0.4 mg via INTRAVENOUS

## 2023-11-20 MED ORDER — RUBIDIUM RB82 GENERATOR (RUBYFILL)
26.3500 | PACK | Freq: Once | INTRAVENOUS | Status: AC
  Administered 2023-11-20: 26.35 via INTRAVENOUS

## 2023-11-20 MED ORDER — REGADENOSON 0.4 MG/5ML IV SOLN
INTRAVENOUS | Status: AC
  Filled 2023-11-20: qty 5

## 2023-11-20 MED ORDER — RUBIDIUM RB82 GENERATOR (RUBYFILL)
26.6100 | PACK | Freq: Once | INTRAVENOUS | Status: AC
  Administered 2023-11-20: 26.61 via INTRAVENOUS

## 2023-11-22 ENCOUNTER — Ambulatory Visit

## 2023-11-26 NOTE — Telephone Encounter (Signed)
Patient returned RN's call regarding test results.

## 2023-11-27 ENCOUNTER — Other Ambulatory Visit: Payer: Self-pay | Admitting: Family

## 2023-11-29 ENCOUNTER — Ambulatory Visit: Admitting: Family

## 2023-11-29 ENCOUNTER — Encounter: Payer: Self-pay | Admitting: Physical Therapy

## 2023-11-29 ENCOUNTER — Ambulatory Visit: Attending: Neurological Surgery | Admitting: Physical Therapy

## 2023-11-29 DIAGNOSIS — M5416 Radiculopathy, lumbar region: Secondary | ICD-10-CM | POA: Diagnosis not present

## 2023-11-29 DIAGNOSIS — M6281 Muscle weakness (generalized): Secondary | ICD-10-CM | POA: Insufficient documentation

## 2023-11-29 DIAGNOSIS — M62838 Other muscle spasm: Secondary | ICD-10-CM | POA: Insufficient documentation

## 2023-11-29 NOTE — Therapy (Signed)
 OUTPATIENT PHYSICAL THERAPY TREATMENT   Patient Name: ANAMARI GALEAS MRN: [id number] DOB:February 06, 1961, 63 y.o., female Today's Date: 11/29/2023  END OF SESSION:  PT End of Session - 11/29/23 0804     Visit Number 5    Date for PT Re-Evaluation 12/13/23    Authorization Type [employer] & Donelle Funk    PT Start Time [phone]    PT Stop Time [phone]    PT Time Calculation (min) 45 min    Activity Tolerance Patient tolerated treatment well    Behavior During Therapy WFL for tasks assessed/performed                 Past Medical History:  Diagnosis Date   Allergy    allergic rhinitis   Arthritis    Atypical chest pain    Constipation    Cyst, ovarian    Diabetes (HCC)    Diabetes mellitus, type II (HCC)    Elevated alkaline phosphatase level    Endometriosis    Esophageal stricture    Fatty liver    Gastroparesis    GERD (gastroesophageal reflux disease)    Heart murmur    Hepatic hemangioma    Hyperlipidemia    Hypertension    Hypertrophic condition of skin    acrokeratoelastoidosis- s/p 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 1/08- benign   Iron  deficiency anemia    Migraine headache    Non-compliance    Peptic ulcer disease    Thyroid  disease    Past Surgical History:  Procedure Laterality Date   ABDOMINAL EXPLORATION SURGERY     w/bso    BREAST BIOPSY     CARDIAC CATHETERIZATION  2009   mild non obstructive CAD   CHOLECYSTECTOMY     COLONOSCOPY     ESOPHAGEAL MANOMETRY N/A 08/01/2020   Procedure: ESOPHAGEAL MANOMETRY (EM);  Surgeon: Ace Holder, MD;  Location: [facility];  Service: Gastroenterology;  Laterality: N/A;   KNEE SURGERY  2005    left knee   LEFT HEART CATH AND CORONARY ANGIOGRAPHY N/A 07/17/2017   Procedure: LEFT HEART CATH AND CORONARY ANGIOGRAPHY;  Surgeon: [place], Peter M, MD;  Location: [facility] INVASIVE CV LAB;  Service: Cardiovascular;  Laterality: N/A;   LEFT HEART CATH AND CORONARY ANGIOGRAPHY N/A 09/01/2021   Procedure: LEFT HEART CATH AND CORONARY  ANGIOGRAPHY;  Surgeon: Odie Benne, MD;  Location: [facility] INVASIVE CV LAB;  Service: Cardiovascular;  Laterality: N/A;   POLYPECTOMY     TOTAL ABDOMINAL HYSTERECTOMY     UPPER GASTROINTESTINAL ENDOSCOPY     Patient Active Problem List   Diagnosis Date Noted   Cutaneous abscess of left lower extremity 10/25/2023   Unilateral primary osteoarthritis, left knee 10/04/2023   Type 2 diabetes mellitus with hyperglycemia, without long-term current use of insulin  (HCC) 08/16/2023   Dysfunction of both eustachian tubes 08/16/2023   Positive ANA (antinuclear antibody) 03/22/2023   Sedimentation rate elevation 03/22/2023   Bulging of lumbar intervertebral disc 09/24/2022   Left arm pain 08/14/2022   Arthritis 05/11/2022   Controlled type 2 diabetes mellitus without complication, without long-term current use of insulin  (HCC) 12/01/2021   Coronary artery disease of native heart with stable angina pectoris (HCC) 06/30/2021   Intertrigo 05/26/2021   Hypercontractile esophagus    Colitis 02/04/2018   Aortic atherosclerosis (HCC) 07/17/2017   Insomnia 01/12/2017   Hyperthyroidism 02/29/2012   Esophageal dysphagia 04/18/2011   Ulnar neuropathy 03/30/2011   Lumbar radiculopathy 10/18/2010   Fibromyalgia 07/20/2009   Hyperlipidemia 05/26/2009  Vitamin D  deficiency 05/23/2009   Polyarthralgia 04/12/2009   Iron  deficiency anemia 10/25/2008   GERD 10/25/2008   LIVER HEMANGIOMA 01/01/2008   ESOPHAGEAL STRICTURE 01/01/2008   Gastroparesis 01/01/2008   Anemia 12/08/2007   Allergic rhinitis 12/08/2007   LUQ abdominal pain 09/23/2007   Migraine headache 08/11/2007   HIATAL HERNIA 08/11/2007   OVARIAN CYST 08/11/2007   Essential hypertension 05/06/2007    PCP: Dorrene Gaucher, NP  REFERRING PROVIDER: Elna Haggis, MD  REFERRING DIAG: M54.16 (ICD-10-CM) - Radiculopathy, lumbar region   RATIONALE FOR EVALUATION AND TREATMENT: Rehabilitation  THERAPY DIAG:  Radiculopathy, lumbar  region  Muscle weakness (generalized)  Other muscle spasm  ONSET DATE: > 1 year  NEXT MD VISIT: ~June 2025  SUBJECTIVE:                                                                                                                                                                                           SUBJECTIVE STATEMENT: Pt reports she is feeling "poorly" today.  Mild R sided LBP  PERTINENT HISTORY:  Arthritis, DM-II, Endometriosis, GERD, HTN, heart murmur, iron  deficiency anemia, migraine headaches, fibromyalgia  PAIN:  Are you having pain? Yes: NPRS scale: 3/10  Pain location: R lower back Pain description: sharp when moving, achy when resting Aggravating factors: Stand >1hr, walking 2-3 mi Relieving factors: Heat, massaging  PRECAUTIONS: None  RED FLAGS: None   WEIGHT BEARING RESTRICTIONS: No  FALLS:  Has patient fallen in last 6 months? No  LIVING ENVIRONMENT: Lives with: lives alone Lives in: House/apartment Stairs: Yes: External: 6 steps; none Has following equipment at home: None  OCCUPATION: Retired  PLOF: Independent and Leisure: Had to quit work, gym, exercise  PATIENT GOALS: Not to have to the pain   OBJECTIVE:  Note: Objective measures were completed at Evaluation unless otherwise noted.  DIAGNOSTIC FINDINGS:  02/28/23 CLINICAL DATA:  Acute left-sided low back pain with left-sided sciatica. Left hip pain.   EXAM: LUMBAR SPINE - COMPLETE 4+ VIEW   COMPARISON:  Radiographs 04/07/2021   FINDINGS: Five non-rib-bearing lumbar vertebra. Lumbar alignment is normal. Vertebral body heights are normal. Anterior spurring at multiple levels most prominent at L5-S1. The disc spaces are preserved. No evidence of focal bone lesion, fracture or pars defects. Aortic atherosclerosis.   IMPRESSION: Mild spondylosis with anterior spurring most prominent at L5-S1.  PATIENT SURVEYS:  Modified Oswestry 14/50 = 28%   COGNITION: Overall cognitive  status: Within functional limits for tasks assessed     SENSATION: Proprioception: WFL, standing for too long gets numbness  MUSCLE LENGTH: Quad: R=L severe tightness HS: mod tight B ITB: R mild  tightness  POSTURE: rounded shoulders, forward head, flexed trunk , and weight shift right  PALPATION: TTP in L5-S1, R QL, upper glutes  LUMBAR ROM:   AROM eval 11/29/23  Flexion Above ankles, p! ~Ankles  Extension Limited, p! WFL  Right lateral flexion Lateral knee Fibular head  Left lateral flexion Lateral knee, p! R side Fibular head  Right rotation [facility] WFL  Left rotation [facility] WFL   (Blank rows = not tested)  LOWER EXTREMITY MMT:    MMT Right eval Left eval R 11/29/23 L 11/29/23  Hip flexion 5 4- 4+ 5  Hip extension 4- 4 4+ 4+  Hip abduction 4- 4 4+ 4+  Hip adduction 3+ sharp p! 4 4+ 4+  Hip internal rotation 3+ sharp p! 4- 4+ dull p! 4+  Hip external rotation 3+ sharp p! 4- 4+ 4+  Knee flexion 4- 4 4+ 4+  Knee extension 4- 4 4+ 4+  Ankle dorsiflexion 4- p! 5 4+ 5  Ankle plantarflexion      Ankle inversion      Ankle eversion       (Blank rows = not tested)  LUMBAR SPECIAL TESTS:  Straight leg raise test: Positive R  GAIT: Distance walked: Front desk to treatment room 1 Assistive device utilized: None Level of assistance: Complete Independence Comments: Shifts weight to R to alleviate pain  TREATMENT DATE:                                                                                                                                11/29/23 THERAPEUTIC EXERCISE: To improve strength, endurance, ROM, and flexibility.  Demonstration, verbal and tactile cues throughout for technique. Rec Bike - L2 x 6 min Supine R glute stretch 2 x 30" Supine R KTOS piriformis stretch 2 x 30"  MANUAL THERAPY: To promote normalized muscle tension, improved flexibility, and reduced pain utilizing connective tissue massage, therapeutic massage, and manual TP therapy.  STM/DTM and manual TPR to R  upper glutes  NEUROMUSCULAR RE-EDUCATION: To improve balance, coordination, kinesthesia, and posture. Standing alt hip extension leaning into wall ladder x 10 Modified bird dog leaning into wall ladder x 10 Standing TrA + GTB scap retraction and B shoulder row 2 x 10, 2nd set in staggered stance Standing TrA + GTB scap retraction and B shoulder extension 2 x 10, 2nd set in staggered stance Standing GTB pallof press x 10 bil Standing GTB pallof press + short arc trunk rotation x 10 bil Functional squat + B UE OH raise 2 x 10   11/15/23 THERAPEUTIC EXERCISE: To improve strength and endurance.  Demonstration, verbal and tactile cues throughout for technique.  NuStep L5 x 6 min UE/LE  SELF CARE: Provided education to facilitate performance of basic household cleaning/chores.  Provided education in proper posture and body mechanics for typical daily positioning and household chores to minimize strain on low back and neck.  NEUROMUSCULAR RE-EDUCATION: To improve coordination,  kinesthesia, posture, and proprioception. Hooklying TrA + GTB B hip ABD/ER clam x 10 Bridge + GTB hip ABD isometric 10 x 5" S/L GTB clam 2 x 10 bil S/L RTB reverse clam x 10 bil Bridge + GTB clam x 10 - cues to avoid dropping hips  Quadruped cat/cow x 10 Quadruped alt hip extension x 10 - cues to avoid rotating hips or arching back (maintain level back) Follow-up  11/08/23 THERAPEUTIC EXERCISE: To improve strength, endurance, ROM, and flexibility.  Demonstration, verbal and tactile cues throughout for technique. Nustep L5x41min UE/LE Reviewed and updated HEP NEUROMUSCULAR RE-EDUCATION: To improve proprioception, balance, and kinesthesia. Standing rows RTB 2 x 10  Standing shoulder ext RTB 2x10 Pallof press standing RTB 2 x 10 B Seated small trunk twist yellow wt ball x 10 B Seated shoulder flexion yellow wt ball 2 x 10 Bridges 10x3" no hands Supine hip ABD RTB + TrA 2 x 10   10/25/23 THERAPEUTIC EXERCISE: To  improve strength, endurance, ROM, and flexibility.  Demonstration, verbal and tactile cues throughout for technique. Nustep L5x66min UE/LE Supine HS stretch x 30" B Supine KTOS X 30" B Supine trunk rotation x 5 x 3-5" B Modified thomas stretch x 30" B Orange pball rollout flexion x 10 NEUROMUSCULAR RE-EDUCATION: To improve proprioception, balance, and kinesthesia. Ab sets with orange pball 10x5" Ball squeeze with ab sets 10x5" Ab set with bent knee fallout RTB x 10 Seated rows  and shoulder extension RTB x 10- cues to avoid scap elevation and for retraction  10/18/23 THERAPEUTIC EXERCISE: To improve strength, endurance, ROM, and flexibility.  Demonstration, verbal and tactile cues throughout for technique. Supine HS stretch x 30" B Supine KTOS X 30" B Supine trunk rotation x 10 x 3-5" B Modified thomas stretch x 30"   PATIENT EDUCATION:  Education details: HEP update - bent knee fallout RTB and hip ball squeeze Person educated: Patient Education method: Explanation, Demonstration, Tactile cues, Verbal cues, and Handouts Education comprehension: verbalized understanding, returned demonstration, verbal cues required, tactile cues required, and needs further education  HOME EXERCISE PROGRAM: Access Code: [email or web address] URL: [email or web address] Date: 11/15/2023 Prepared by: Felecia Hopper  Exercises - Supine Hamstring Stretch with Strap  - 1 x daily - 7 x weekly - 2 sets - 5 reps - 30 hold - Supine Piriformis Stretch with Leg Straight  - 2 x daily - 7 x weekly - 2 sets - 30 sec hold - Supine Lower Trunk Rotation  - 1 x daily - 7 x weekly - 2 sets - 10 reps - 3-5 hold - Modified Thomas Stretch  - 1 x daily - 7 x weekly - 2 sets - 30 hold - Supine Hip Adduction Isometric with Ball  - 1 x daily - 7 x weekly - 2 sets - 10 reps - 5 sec hold - Hooklying Clamshell with Resistance  - 1 x daily - 7 x weekly - 2 sets - 10 reps - 3 sec hold - Bridge with Resistance  - 1 x daily - 3 x  weekly - 2 sets - 10 reps - 5 sec hold - Clam with Resistance  - 1 x daily - 3 x weekly - 2 sets - 10 reps - 3-5 sec hold - Sidelying Reverse Clamshell with Resistance  - 1 x daily - 3 x weekly - 2 sets - 10 reps - 3-5 sec hold - Quadruped Cat Cow  - 1 x daily - 3 x weekly - 2 sets - 10 reps -  3 sec hold - Standing Shoulder Row with Anchored Resistance  - 1 x daily - 7 x weekly - 2 sets - 10 reps - Shoulder Extension with Anchored Resistance  - 1 x daily - 7 x weekly - 2 sets - 10 reps  Patient Education - Posture and Body Mechanics  ASSESSMENT:  CLINICAL IMPRESSION: Marca reports mild R-sided glute pain today with increased muscle tension/trigger points identified in R upper glutes.  Addressed these with STM/DTM and manual TPR with patient noting good relief.  Reassessed lumbar ROM with improving motion noted and no pain reported.  Gains also noted in B LE strength with overall strength now more symmetrical at 4+ to 5/5 in pain noted only resisted R hip IR.  Shoshanna is progressing well toward her PT goals.  She is pleased with her progress and feels like she will be ready to transition to her HEP at the end of her current POC, therefore focused on review and progression of her strengthening exercises to help improve standing tolerance as prolonged standing seems to be her trigger for increased pain.  Tyan will benefit from continued skilled PT to address ongoing strength deficits to improve mobility and activity tolerance with decreased pain interference.   IE: Minetta is a 63 y.o. female who was seen today for physical therapy evaluation and treatment for LBP with radiculopathy. She reports LBP began several years ago after a fall. Has previous PT experience with having an episode of LBP with radiculopathy back in 2019. Pain is worse with movement and bending over. She has issues with coming back up from bending, having to crawl up her body to stand up straight. She has a tendency to shift her  weight to R side to alleviate pain when sitting or standing. The pain interferes with her ability to work and participate in extracurricular activities that she enjoys. Renleigh is limited lumbar ROM and shows deficits in LE strength, abnormal posture and TTP with anormal muscle tension around L5-S1 paraspinals, R upper glutes, and R QL. On Modified Oswestry, she scored 14/50 demonstrating 28% or moderate disability. Tramya will benefit from skilled PT to address above deficits to improve mobility and activity tolerance with decreased pain interference.    OBJECTIVE IMPAIRMENTS: Abnormal gait, decreased activity tolerance, decreased coordination, decreased endurance, decreased knowledge of condition, decreased mobility, difficulty walking, decreased ROM, decreased strength, increased fascial restrictions, impaired perceived functional ability, increased muscle spasms, impaired flexibility, impaired sensation, impaired tone, improper body mechanics, postural dysfunction, and pain.   ACTIVITY LIMITATIONS: lifting, bending, sitting, standing, sleeping, and locomotion level  PARTICIPATION LIMITATIONS: meal prep, cleaning, laundry, interpersonal relationship, community activity, and occupation  PERSONAL FACTORS: Past/current experiences, Time since onset of injury/illness/exacerbation, and 3+ comorbidities: Arthritis, DM-II, Endometriosis, GERD, HTN, heart murmur, iron  deficiency anemia, migraine headaches, fibromyalgia are also affecting patient's functional outcome.   REHAB POTENTIAL: Good  CLINICAL DECISION MAKING: Evolving/moderate complexity  EVALUATION COMPLEXITY: Moderate   GOALS: Goals reviewed with patient? Yes  SHORT TERM GOALS: Target date: 11/15/2023  1.  Pt will be independent with initial HEP to improve outcomes and carryover Baseline:  Goal status: MET - 11/15/23  2.  Pt will report 25% improvement in low back pain to improve QOL Baseline:  Goal status: MET - 11/08/23  LONG TERM  GOALS: Target date: 12/13/2023  1.  Pt will be independent with ongoing/advanced HEP for self-management at home Baseline:  11/15/23 - met for current HEP Goal status: IN PROGRESS - 11/29/23 - reviewed HEP and  progressed resistance as appropriate  2.  Pt will report 50-75% improvement in low back pain to improve QOL  Baseline: 5/10 currently, 10/10 worst Goal status: MET - 11/15/23 - almost 100% improvement  3.  Pt will demonstrate improved LE strength to >/= 4+/5 for improved stability and ease of mobility  Baseline: Refer to LE MMT Goal status: MET - 6/6//25   4.  Pt will report </= 2/50 on Modified Oswestry (MCID = 12%) to demonstrate improved functional ability with decreased pain interference Baseline: 14/50 = 28% Goal status: INITIAL  5.  Pt to demonstrate ability to achieve and maintain good spinal alignment/posturing and body mechanics needed for daily activities  Baseline:  11/15/23 - provided education in proper posture and body mechanics for typical daily positioning and household chores to minimize strain on low back.  Goal status: MET - 11/29/23    PLAN:  PT FREQUENCY: 1-2x/week  PT DURATION: 8 weeks  PLANNED INTERVENTIONS: 97164- PT Re-evaluation, 97110-Therapeutic exercises, 97530- Therapeutic activity, 97112- Neuromuscular re-education, 97535- Self Care, 16109- Manual therapy, [id number]- Aquatic Therapy, [id number]- Electrical stimulation (unattended), 97016- Vasopneumatic device, [id number]- Ultrasound, [id number]- Traction (mechanical), [id number]- Ionotophoresis 4mg /ml Dexamethasone , Patient/Family education, Taping, Dry Needling, Joint mobilization, Spinal manipulation, Spinal mobilization, Cryotherapy, and Moist heat.  PLAN FOR NEXT SESSION: progress lumbopelvic flexibility & ROM; progress LE/core strengthening exercises; MT +/- TPDN around L5/S1, QL, upper glutes; review posture and body mechanics PRN   Francisco Irving, PT 11/29/2023, 9:02 AM

## 2023-12-02 DIAGNOSIS — G4733 Obstructive sleep apnea (adult) (pediatric): Secondary | ICD-10-CM | POA: Diagnosis not present

## 2023-12-05 ENCOUNTER — Ambulatory Visit: Admitting: Physician Assistant

## 2023-12-06 ENCOUNTER — Telehealth: Payer: Self-pay | Admitting: Family

## 2023-12-06 ENCOUNTER — Ambulatory Visit

## 2023-12-06 ENCOUNTER — Ambulatory Visit (INDEPENDENT_AMBULATORY_CARE_PROVIDER_SITE_OTHER): Admitting: Family

## 2023-12-06 VITALS — BP 128/69 | HR 67 | Temp 98.8°F | Resp 16 | Ht 64.0 in | Wt 219.0 lb

## 2023-12-06 DIAGNOSIS — E0590 Thyrotoxicosis, unspecified without thyrotoxic crisis or storm: Secondary | ICD-10-CM

## 2023-12-06 DIAGNOSIS — D500 Iron deficiency anemia secondary to blood loss (chronic): Secondary | ICD-10-CM

## 2023-12-06 DIAGNOSIS — E559 Vitamin D deficiency, unspecified: Secondary | ICD-10-CM

## 2023-12-06 DIAGNOSIS — M797 Fibromyalgia: Secondary | ICD-10-CM

## 2023-12-06 DIAGNOSIS — E785 Hyperlipidemia, unspecified: Secondary | ICD-10-CM

## 2023-12-06 DIAGNOSIS — K3184 Gastroparesis: Secondary | ICD-10-CM

## 2023-12-06 DIAGNOSIS — M5416 Radiculopathy, lumbar region: Secondary | ICD-10-CM | POA: Diagnosis not present

## 2023-12-06 DIAGNOSIS — G43809 Other migraine, not intractable, without status migrainosus: Secondary | ICD-10-CM | POA: Diagnosis not present

## 2023-12-06 DIAGNOSIS — I25118 Atherosclerotic heart disease of native coronary artery with other forms of angina pectoris: Secondary | ICD-10-CM | POA: Diagnosis not present

## 2023-12-06 DIAGNOSIS — D509 Iron deficiency anemia, unspecified: Secondary | ICD-10-CM | POA: Diagnosis not present

## 2023-12-06 DIAGNOSIS — M6281 Muscle weakness (generalized): Secondary | ICD-10-CM

## 2023-12-06 DIAGNOSIS — K219 Gastro-esophageal reflux disease without esophagitis: Secondary | ICD-10-CM | POA: Diagnosis not present

## 2023-12-06 DIAGNOSIS — I10 Essential (primary) hypertension: Secondary | ICD-10-CM

## 2023-12-06 DIAGNOSIS — E119 Type 2 diabetes mellitus without complications: Secondary | ICD-10-CM

## 2023-12-06 DIAGNOSIS — M62838 Other muscle spasm: Secondary | ICD-10-CM

## 2023-12-06 MED ORDER — METOCLOPRAMIDE HCL 5 MG PO TABS: ORAL_TABLET | ORAL | Status: DC

## 2023-12-06 NOTE — Patient Instructions (Signed)
 VISIT SUMMARY:  During your routine follow-up visit, we reviewed your blood pressure, diabetes management, and other chronic conditions. We also discussed your current medications and any necessary adjustments.  YOUR PLAN:  TYPE 2 DIABETES MELLITUS: Your A1c level has increased to 7.1%. Our goal is to keep it under 7%. -We will check your A1c level today.  HYPERTENSION: Your blood pressure is well-controlled at 128/69 mmHg with your current medications.  CORONARY ARTERY DISEASE: You have occasional chest pain and are under the care of a cardiologist. -Continue taking Imdur  and aspirin  81 mg as prescribed. -You are undergoing tests to determine the cause of your chest pain.  HYPERLIPIDEMIA: Your cholesterol levels are well-controlled with Repatha  injections.  GASTROPARESIS: You experience nausea related to gastroparesis. -Continue taking Zofran  and Reglan  before meals as they are providing relief.  FIBROMYALGIA: You have muscle pain related to fibromyalgia. -Continue taking Cymbalta  for pain management. -Exercise is recommended to help manage symptoms.  CHRONIC BACK PAIN: You are experiencing chronic back pain. -Continue attending therapy for management.  ANEMIA: You have mild anemia, last checked in April. -We will order iron  studies to check your current levels.  HYPOTHYROIDISM: Your thyroid  levels are managed by Dr. Clerance Dais.  VITAMIN D  DEFICIENCY: You are taking a vitamin D  supplement. -We will check your vitamin D  levels.  GENERAL HEALTH MAINTENANCE: Your eye exam is up to date and a foot exam was performed during this visit. -Obtain a copy of your eye exam report from [facility].  FOLLOW-UP: Routine follow-up to monitor chronic conditions and adjust treatment as necessary. -Follow up in 3 months. -Stop by the lab for tests on the way out.

## 2023-12-06 NOTE — Assessment & Plan Note (Signed)
 Lab Results  Component Value Date   WBC 7.2 10/21/2023   HGB 11.6 10/21/2023   HCT 37.9 10/21/2023   MCV 73 (L) 10/21/2023   PLT 220 10/21/2023

## 2023-12-06 NOTE — Assessment & Plan Note (Signed)
 Fair symptom management on reglan  an prn zofran .

## 2023-12-06 NOTE — Assessment & Plan Note (Signed)
 Lab Results  Component Value Date   HGBA1C 7.1 (H) 08/09/2023   HGBA1C 6.2 02/03/2022   HGBA1C 6.8 (H) 12/01/2021   Lab Results  Component Value Date   MICROALBUR 3.6 (H) 08/09/2023   LDLCALC 67 08/09/2023   CREATININE 0.74 10/21/2023   Maintained on Farxiga . Continue same. Update A1C.

## 2023-12-06 NOTE — Telephone Encounter (Signed)
 Please request DM eye exam from [facility].

## 2023-12-06 NOTE — Assessment & Plan Note (Signed)
 Stable on amlodipine  and chlorthalidone . Continue same.

## 2023-12-06 NOTE — Assessment & Plan Note (Signed)
 Not on supplement. Update level.

## 2023-12-06 NOTE — Assessment & Plan Note (Signed)
 Lab Results  Component Value Date   CHOL 137 08/09/2023   HDL 41.80 08/09/2023   LDLCALC 67 08/09/2023   LDLDIRECT 76 07/21/2019   TRIG 144.0 08/09/2023   CHOLHDL 3 08/09/2023   Lipids stable on repatha . Continue same.

## 2023-12-06 NOTE — Assessment & Plan Note (Signed)
Continues PT

## 2023-12-06 NOTE — Assessment & Plan Note (Signed)
 No recent migraines, continues imitrex  prn.

## 2023-12-06 NOTE — Therapy (Signed)
 OUTPATIENT PHYSICAL THERAPY TREATMENT   Patient Name: [name] MRN: [id number] DOB:[birth year], 63 y.o., female Today's Date: 12/06/2023  END OF SESSION:  PT End of Session - 12/06/23 0858     Visit Number 6    Date for PT Re-Evaluation 12/13/23    Authorization Type [employer] Dual Complete & [name]    PT Start Time [phone]    PT Stop Time 0940    PT Time Calculation (min) 50 min    Activity Tolerance Patient tolerated treatment well    Behavior During Therapy WFL for tasks assessed/performed               Past Medical History:  Diagnosis Date   Allergy    allergic rhinitis   Arthritis    Atypical chest pain    Constipation    Cyst, ovarian    Diabetes (HCC)    Diabetes mellitus, type II (HCC)    Elevated alkaline phosphatase level    Endometriosis    Esophageal stricture    Fatty liver    Gastroparesis    GERD (gastroesophageal reflux disease)    Heart murmur    Hepatic hemangioma    Hyperlipidemia    Hypertension    Hypertrophic condition of skin    acrokeratoelastoidosis- s/p 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 1/08- benign   Iron  deficiency anemia    Migraine headache    Non-compliance    Peptic ulcer disease    Thyroid  disease    Past Surgical History:  Procedure Laterality Date   ABDOMINAL EXPLORATION SURGERY     w/bso    BREAST BIOPSY     CARDIAC CATHETERIZATION  2009   mild non obstructive CAD   CHOLECYSTECTOMY     COLONOSCOPY     ESOPHAGEAL MANOMETRY N/A 08/01/2020   Procedure: ESOPHAGEAL MANOMETRY (EM);  Surgeon: Ace Holder, MD;  Location: [facility];  Service: Gastroenterology;  Laterality: N/A;   KNEE SURGERY  2005    left knee   LEFT HEART CATH AND CORONARY ANGIOGRAPHY N/A 07/17/2017   Procedure: LEFT HEART CATH AND CORONARY ANGIOGRAPHY;  Surgeon: [place], Peter M, MD;  Location: [facility] INVASIVE CV LAB;  Service: Cardiovascular;  Laterality: N/A;   LEFT HEART CATH AND CORONARY ANGIOGRAPHY N/A 09/01/2021   Procedure: LEFT HEART CATH AND CORONARY  ANGIOGRAPHY;  Surgeon: Odie Benne, MD;  Location: [facility] INVASIVE CV LAB;  Service: Cardiovascular;  Laterality: N/A;   POLYPECTOMY     TOTAL ABDOMINAL HYSTERECTOMY     UPPER GASTROINTESTINAL ENDOSCOPY     Patient Active Problem List   Diagnosis Date Noted   Cutaneous abscess of left lower extremity 10/25/2023   Unilateral primary osteoarthritis, left knee 10/04/2023   Type 2 diabetes mellitus with hyperglycemia, without long-term current use of insulin  (HCC) 08/16/2023   Dysfunction of both eustachian tubes 08/16/2023   Positive ANA (antinuclear antibody) 03/22/2023   Sedimentation rate elevation 03/22/2023   Bulging of lumbar intervertebral disc 09/24/2022   Left arm pain 08/14/2022   Arthritis 05/11/2022   Controlled type 2 diabetes mellitus without complication, without long-term current use of insulin  (HCC) 12/01/2021   Coronary artery disease of native heart with stable angina pectoris (HCC) 06/30/2021   Intertrigo 05/26/2021   Hypercontractile esophagus    Colitis 02/04/2018   Aortic atherosclerosis (HCC) 07/17/2017   Insomnia 01/12/2017   Hyperthyroidism 02/29/2012   Esophageal dysphagia 04/18/2011   Ulnar neuropathy 03/30/2011   Lumbar radiculopathy 10/18/2010   Fibromyalgia 07/20/2009   Hyperlipidemia 05/26/2009   Vitamin D   deficiency 05/23/2009   Polyarthralgia 04/12/2009   Iron  deficiency anemia 10/25/2008   GERD 10/25/2008   LIVER HEMANGIOMA 01/01/2008   ESOPHAGEAL STRICTURE 01/01/2008   Gastroparesis 01/01/2008   Anemia 12/08/2007   Allergic rhinitis 12/08/2007   LUQ abdominal pain 09/23/2007   Migraine headache 08/11/2007   HIATAL HERNIA 08/11/2007   OVARIAN CYST 08/11/2007   Essential hypertension 05/06/2007    PCP: Dorrene Gaucher, NP  REFERRING PROVIDER: Elna Haggis, MD  REFERRING DIAG: M54.16 (ICD-10-CM) - Radiculopathy, lumbar region   RATIONALE FOR EVALUATION AND TREATMENT: Rehabilitation  THERAPY DIAG:  Radiculopathy, lumbar  region  Muscle weakness (generalized)  Other muscle spasm  ONSET DATE: > 1 year  NEXT MD VISIT: ~June 2025  SUBJECTIVE:                                                                                                                                                                                           SUBJECTIVE STATEMENT: Pt reports she had LB spasms this past weekend, while cooking  PERTINENT HISTORY:  Arthritis, DM-II, Endometriosis, GERD, HTN, heart murmur, iron  deficiency anemia, migraine headaches, fibromyalgia  PAIN:  Are you having pain? Yes: NPRS scale: 5/10  Pain location: B lower back Pain description: dull pain Aggravating factors: Stand >1hr, walking 2-3 mi Relieving factors: Heat, massaging  PRECAUTIONS: None  RED FLAGS: None   WEIGHT BEARING RESTRICTIONS: No  FALLS:  Has patient fallen in last 6 months? No  LIVING ENVIRONMENT: Lives with: lives alone Lives in: House/apartment Stairs: Yes: External: 6 steps; none Has following equipment at home: None  OCCUPATION: Retired  PLOF: Independent and Leisure: Had to quit work, gym, exercise  PATIENT GOALS: Not to have to the pain   OBJECTIVE:  Note: Objective measures were completed at Evaluation unless otherwise noted.  DIAGNOSTIC FINDINGS:  02/28/23 CLINICAL DATA:  Acute left-sided low back pain with left-sided sciatica. Left hip pain.   EXAM: LUMBAR SPINE - COMPLETE 4+ VIEW   COMPARISON:  Radiographs 04/07/2021   FINDINGS: Five non-rib-bearing lumbar vertebra. Lumbar alignment is normal. Vertebral body heights are normal. Anterior spurring at multiple levels most prominent at L5-S1. The disc spaces are preserved. No evidence of focal bone lesion, fracture or pars defects. Aortic atherosclerosis.   IMPRESSION: Mild spondylosis with anterior spurring most prominent at L5-S1.  PATIENT SURVEYS:  Modified Oswestry 14/50 = 28%   COGNITION: Overall cognitive status: Within functional  limits for tasks assessed     SENSATION: Proprioception: WFL, standing for too long gets numbness  MUSCLE LENGTH: Quad: R=L severe tightness HS: mod tight B ITB: R mild tightness  POSTURE: rounded shoulders, forward head,  flexed trunk , and weight shift right  PALPATION: TTP in L5-S1, R QL, upper glutes  LUMBAR ROM:   AROM eval 11/29/23  Flexion Above ankles, p! ~Ankles  Extension Limited, p! WFL  Right lateral flexion Lateral knee Fibular head  Left lateral flexion Lateral knee, p! R side Fibular head  Right rotation [facility] WFL  Left rotation [facility] WFL   (Blank rows = not tested)  LOWER EXTREMITY MMT:    MMT Right eval Left eval R 11/29/23 L 11/29/23  Hip flexion 5 4- 4+ 5  Hip extension 4- 4 4+ 4+  Hip abduction 4- 4 4+ 4+  Hip adduction 3+ sharp p! 4 4+ 4+  Hip internal rotation 3+ sharp p! 4- 4+ dull p! 4+  Hip external rotation 3+ sharp p! 4- 4+ 4+  Knee flexion 4- 4 4+ 4+  Knee extension 4- 4 4+ 4+  Ankle dorsiflexion 4- p! 5 4+ 5  Ankle plantarflexion      Ankle inversion      Ankle eversion       (Blank rows = not tested)  LUMBAR SPECIAL TESTS:  Straight leg raise test: Positive R  GAIT: Distance walked: Front desk to treatment room 1 Assistive device utilized: None Level of assistance: Complete Independence Comments: Shifts weight to R to alleviate pain  TREATMENT DATE:                                                                                                                               12/06/23 THERAPEUTIC EXERCISE: To improve strength, endurance, ROM, and flexibility.  Demonstration, verbal and tactile cues throughout for technique. Nustep L5x10min UE/LE S/L reverse clam RTB 2x10 Cat/cow quadruped x 10 NEUROMUSCULAR RE-EDUCATION: To improve balance, coordination, kinesthesia, and posture. Standing alt hip extension leaning into wall ladder x 10 Modified bird dog leaning into wall ladder x 10 Standing TrA + GTB scap retraction and B shoulder row 2 x  10, Standing TrA + GTB scap retraction and B shoulder extension 2 x 10,  Standing GTB pallof press 2 x 10 bil Standing GTB trunk rotation x 10 bil Leg press 20lb BLE x 20 MANUAL THERAPY: To promote normalized muscle tension, improved flexibility, and reduced pain utilizing connective tissue massage, therapeutic massage, and manual TP therapy.  STM/DTM to B lumbar paraspinals and QL   MOIST HEAT: 10 min to low back in prone  11/29/23 THERAPEUTIC EXERCISE: To improve strength, endurance, ROM, and flexibility.  Demonstration, verbal and tactile cues throughout for technique. Rec Bike - L2 x 6 min Supine R glute stretch 2 x 30 Supine R KTOS piriformis stretch 2 x 30  MANUAL THERAPY: To promote normalized muscle tension, improved flexibility, and reduced pain utilizing connective tissue massage, therapeutic massage, and manual TP therapy.  STM/DTM and manual TPR to R upper glutes  NEUROMUSCULAR RE-EDUCATION: To improve balance, coordination, kinesthesia, and posture. Standing alt hip extension leaning into wall ladder x 10 Modified  bird dog leaning into wall ladder x 10 Standing TrA + GTB scap retraction and B shoulder row 2 x 10, 2nd set in staggered stance Standing TrA + GTB scap retraction and B shoulder extension 2 x 10, 2nd set in staggered stance Standing GTB pallof press x 10 bil Standing GTB pallof press + short arc trunk rotation x 10 bil Functional squat + B UE OH raise 2 x 10   11/15/23 THERAPEUTIC EXERCISE: To improve strength and endurance.  Demonstration, verbal and tactile cues throughout for technique.  NuStep L5 x 6 min UE/LE  SELF CARE: Provided education to facilitate performance of basic household cleaning/chores.  Provided education in proper posture and body mechanics for typical daily positioning and household chores to minimize strain on low back and neck.  NEUROMUSCULAR RE-EDUCATION: To improve coordination, kinesthesia, posture, and proprioception. Hooklying  TrA + GTB B hip ABD/ER clam x 10 Bridge + GTB hip ABD isometric 10 x 5 S/L GTB clam 2 x 10 bil S/L RTB reverse clam x 10 bil Bridge + GTB clam x 10 - cues to avoid dropping hips  Quadruped cat/cow x 10 Quadruped alt hip extension x 10 - cues to avoid rotating hips or arching back (maintain level back) Follow-up  11/08/23 THERAPEUTIC EXERCISE: To improve strength, endurance, ROM, and flexibility.  Demonstration, verbal and tactile cues throughout for technique. Nustep L5x20min UE/LE Reviewed and updated HEP NEUROMUSCULAR RE-EDUCATION: To improve proprioception, balance, and kinesthesia. Standing rows RTB 2 x 10  Standing shoulder ext RTB 2x10 Pallof press standing RTB 2 x 10 B Seated small trunk twist yellow wt ball x 10 B Seated shoulder flexion yellow wt ball 2 x 10 Bridges 10x3 no hands Supine hip ABD RTB + TrA 2 x 10   10/25/23 THERAPEUTIC EXERCISE: To improve strength, endurance, ROM, and flexibility.  Demonstration, verbal and tactile cues throughout for technique. Nustep L5x16min UE/LE Supine HS stretch x 30 B Supine KTOS X 30 B Supine trunk rotation x 5 x 3-5 B Modified thomas stretch x 30 B Orange pball rollout flexion x 10 NEUROMUSCULAR RE-EDUCATION: To improve proprioception, balance, and kinesthesia. Ab sets with orange pball 10x5 Ball squeeze with ab sets 10x5 Ab set with bent knee fallout RTB x 10 Seated rows  and shoulder extension RTB x 10- cues to avoid scap elevation and for retraction  10/18/23 THERAPEUTIC EXERCISE: To improve strength, endurance, ROM, and flexibility.  Demonstration, verbal and tactile cues throughout for technique. Supine HS stretch x 30 B Supine KTOS X 30 B Supine trunk rotation x 10 x 3-5 B Modified thomas stretch x 30   PATIENT EDUCATION:  Education details: HEP update - bent knee fallout RTB and hip ball squeeze Person educated: Patient Education method: Explanation, Demonstration, Tactile cues, Verbal cues, and  Handouts Education comprehension: verbalized understanding, returned demonstration, verbal cues required, tactile cues required, and needs further education  HOME EXERCISE PROGRAM: Access Code: [email or web address] URL: [email or web address] Date: 11/15/2023 Prepared by: Felecia Hopper  Exercises - Supine Hamstring Stretch with Strap  - 1 x daily - 7 x weekly - 2 sets - 5 reps - 30 hold - Supine Piriformis Stretch with Leg Straight  - 2 x daily - 7 x weekly - 2 sets - 30 sec hold - Supine Lower Trunk Rotation  - 1 x daily - 7 x weekly - 2 sets - 10 reps - 3-5 hold - Modified Thomas Stretch  - 1 x daily - 7 x weekly - 2 sets - 30  hold - Supine Hip Adduction Isometric with Ball  - 1 x daily - 7 x weekly - 2 sets - 10 reps - 5 sec hold - Hooklying Clamshell with Resistance  - 1 x daily - 7 x weekly - 2 sets - 10 reps - 3 sec hold - Bridge with Resistance  - 1 x daily - 3 x weekly - 2 sets - 10 reps - 5 sec hold - Clam with Resistance  - 1 x daily - 3 x weekly - 2 sets - 10 reps - 3-5 sec hold - Sidelying Reverse Clamshell with Resistance  - 1 x daily - 3 x weekly - 2 sets - 10 reps - 3-5 sec hold - Quadruped Cat Cow  - 1 x daily - 3 x weekly - 2 sets - 10 reps - 3 sec hold - Standing Shoulder Row with Anchored Resistance  - 1 x daily - 7 x weekly - 2 sets - 10 reps - Shoulder Extension with Anchored Resistance  - 1 x daily - 7 x weekly - 2 sets - 10 reps  Patient Education - Posture and Body Mechanics  ASSESSMENT:  CLINICAL IMPRESSION: Continued with review of new HEP exercises and focus on solidifying understanding of HEP for upcoming transition. Pt responded well, did require cues to keep arms extended with trunk rotations. Cues also needed to drop shoulders with rows and shoulder ext. STM today to address soft tissue restrictions in low back. Moist heat post visit to increase tissue extensibility. [name] will benefit from continued skilled PT to address ongoing strength deficits to  improve mobility and activity tolerance with decreased pain interference.   IE: [name] is a 63 y.o. female who was seen today for physical therapy evaluation and treatment for LBP with radiculopathy. She reports LBP began several years ago after a fall. Has previous PT experience with having an episode of LBP with radiculopathy back in 2019. Pain is worse with movement and bending over. She has issues with coming back up from bending, having to crawl up her body to stand up straight. She has a tendency to shift her weight to R side to alleviate pain when sitting or standing. The pain interferes with her ability to work and participate in extracurricular activities that she enjoys. [name] is limited lumbar ROM and shows deficits in LE strength, abnormal posture and TTP with anormal muscle tension around L5-S1 paraspinals, R upper glutes, and R QL. On Modified Oswestry, she scored 14/50 demonstrating 28% or moderate disability. [name] will benefit from skilled PT to address above deficits to improve mobility and activity tolerance with decreased pain interference.    OBJECTIVE IMPAIRMENTS: Abnormal gait, decreased activity tolerance, decreased coordination, decreased endurance, decreased knowledge of condition, decreased mobility, difficulty walking, decreased ROM, decreased strength, increased fascial restrictions, impaired perceived functional ability, increased muscle spasms, impaired flexibility, impaired sensation, impaired tone, improper body mechanics, postural dysfunction, and pain.   ACTIVITY LIMITATIONS: lifting, bending, sitting, standing, sleeping, and locomotion level  PARTICIPATION LIMITATIONS: meal prep, cleaning, laundry, interpersonal relationship, community activity, and occupation  PERSONAL FACTORS: Past/current experiences, Time since onset of injury/illness/exacerbation, and 3+ comorbidities: Arthritis, DM-II, Endometriosis, GERD, HTN, heart murmur, iron  deficiency anemia, migraine  headaches, fibromyalgia are also affecting patient's functional outcome.   REHAB POTENTIAL: Good  CLINICAL DECISION MAKING: Evolving/moderate complexity  EVALUATION COMPLEXITY: Moderate   GOALS: Goals reviewed with patient? Yes  SHORT TERM GOALS: Target date: 11/15/2023  1.  Pt will be independent with initial HEP to improve  outcomes and carryover Baseline:  Goal status: MET - 11/15/23  2.  Pt will report 25% improvement in low back pain to improve QOL Baseline:  Goal status: MET - 11/08/23  LONG TERM GOALS: Target date: 12/13/2023  1.  Pt will be independent with ongoing/advanced HEP for self-management at home Baseline:  11/15/23 - met for current HEP Goal status: IN PROGRESS - 12/06/23 - reviewed--- good understanding of HEP  2.  Pt will report 50-75% improvement in low back pain to improve QOL  Baseline: 5/10 currently, 10/10 worst Goal status: MET - 11/15/23 - almost 100% improvement  3.  Pt will demonstrate improved LE strength to >/= 4+/5 for improved stability and ease of mobility  Baseline: Refer to LE MMT Goal status: MET - 6/6//25   4.  Pt will report </= 2/50 on Modified Oswestry (MCID = 12%) to demonstrate improved functional ability with decreased pain interference Baseline: 14/50 = 28% Goal status: INITIAL  5.  Pt to demonstrate ability to achieve and maintain good spinal alignment/posturing and body mechanics needed for daily activities  Baseline:  11/15/23 - provided education in proper posture and body mechanics for typical daily positioning and household chores to minimize strain on low back.  Goal status: MET - 11/29/23    PLAN:  PT FREQUENCY: 1-2x/week  PT DURATION: 8 weeks  PLANNED INTERVENTIONS: 97164- PT Re-evaluation, 97110-Therapeutic exercises, 97530- Therapeutic activity, 97112- Neuromuscular re-education, 97535- Self Care, 53664- Manual therapy, [id number]- Aquatic Therapy, Q0347- Electrical stimulation (unattended), 97016- Vasopneumatic device,  [id number]- Ultrasound, [id number]- Traction (mechanical), [id number]- Ionotophoresis 4mg /ml Dexamethasone , Patient/Family education, Taping, Dry Needling, Joint mobilization, Spinal manipulation, Spinal mobilization, Cryotherapy, and Moist heat.   PLAN FOR NEXT SESSION: transition to HEP? progress lumbopelvic flexibility & ROM; progress LE/core strengthening exercises; MT +/- TPDN around L5/S1, QL, upper glutes; review posture and body mechanics PRN   Clarissa Laird L Hopelynn Gartland, PTA 12/06/2023, 9:34 AM

## 2023-12-06 NOTE — Assessment & Plan Note (Signed)
 Fair control- continue cymbalta .

## 2023-12-06 NOTE — Telephone Encounter (Signed)
 Electronic request made

## 2023-12-06 NOTE — Assessment & Plan Note (Signed)
 Lab Results  Component Value Date   TSH 1.87 09/13/2023   Management per Endo, maintained on tapazole .

## 2023-12-06 NOTE — Assessment & Plan Note (Signed)
Stable on pantoprazole. Continue same.

## 2023-12-06 NOTE — Assessment & Plan Note (Signed)
 Management per cardiology- maintained on imdur  and aspirin  81mg .

## 2023-12-06 NOTE — Progress Notes (Signed)
 Subjective:     Patient ID: [name], female    DOB: [birth year], 63 y.o.   MRN: [id number]  Chief Complaint  Patient presents with   Hypertension    Here for follow up    HPI  Discussed the use of AI scribe software for clinical note transcription with the patient, who gave verbal consent to proceed.  History of Present Illness  [name] is a 63 year old female who presents for a routine follow-up visit.  Her blood pressure is well-controlled at 128/69 mmHg with amlodipine  and chlorthalidone , and Lasix  as needed. Diabetes management shows an A1c of 7.1%, up from 6.2%, with Farxiga  prescribed. She experiences fibromyalgia-related muscle pain, managed with Cymbalta .  She uses Zofran  for nausea and Reglan  before meals for gastroparesis. Cholesterol management includes Repatha  injections.  She has mild anemia, last checked in April, with no recent iron  infusions. She experiences back pain and attends therapy. Occasional chest pain is managed with Imdur  and aspirin  81 mg under cardiology care.  Her hyperthyroid is managed by an endocrinologist. Her eye exam is up to date.     Health Maintenance Due  Topic Date Due   OPHTHALMOLOGY EXAM  Never done   DTaP/Tdap/Td (3 - Td or Tdap) 04/29/2021   COVID-19 Vaccine (5 - 2024-25 season) 02/24/2023    Past Medical History:  Diagnosis Date   Allergy    allergic rhinitis   Arthritis    Atypical chest pain    Constipation    Cyst, ovarian    Diabetes (HCC)    Diabetes mellitus, type II (HCC)    Elevated alkaline phosphatase level    Endometriosis    Esophageal stricture    Fatty liver    Gastroparesis    GERD (gastroesophageal reflux disease)    Heart murmur    Hepatic hemangioma    Hyperlipidemia    Hypertension    Hypertrophic condition of skin    acrokeratoelastoidosis- s/p 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 1/08- benign   Iron  deficiency anemia    Migraine headache    Non-compliance    Peptic ulcer disease    Thyroid   disease     Past Surgical History:  Procedure Laterality Date   ABDOMINAL EXPLORATION SURGERY     w/bso    BREAST BIOPSY     CARDIAC CATHETERIZATION  2009   mild non obstructive CAD   CHOLECYSTECTOMY     COLONOSCOPY     ESOPHAGEAL MANOMETRY N/A 08/01/2020   Procedure: ESOPHAGEAL MANOMETRY (EM);  Surgeon: Ace Holder, MD;  Location: [facility];  Service: Gastroenterology;  Laterality: N/A;   KNEE SURGERY  2005    left knee   LEFT HEART CATH AND CORONARY ANGIOGRAPHY N/A 07/17/2017   Procedure: LEFT HEART CATH AND CORONARY ANGIOGRAPHY;  Surgeon: Swaz[place], Peter M, MD;  Location: [facility] INVASIVE CV LAB;  Service: Cardiovascular;  Laterality: N/A;   LEFT HEART CATH AND CORONARY ANGIOGRAPHY N/A 09/01/2021   Procedure: LEFT HEART CATH AND CORONARY ANGIOGRAPHY;  Surgeon: Odie Benne, MD;  Location: [facility] INVASIVE CV LAB;  Service: Cardiovascular;  Laterality: N/A;   POLYPECTOMY     TOTAL ABDOMINAL HYSTERECTOMY     UPPER GASTROINTESTINAL ENDOSCOPY      Family History  Problem Relation Age of Onset   Hypertension Mother    Rheum arthritis Mother    Hypertension Father    Diabetes Father    Prostate cancer Father    Kidney disease Father    Diabetes Sister  Fibromyalgia Sister    Diabetes Maternal Grandmother    Lung cancer Maternal Grandfather    Arthritis Brother    Migraines Brother    CAD Brother    Fibromyalgia Sister    Migraines Sister    Colon cancer Neg Hx    Thyroid  disease Neg Hx    Colon polyps Neg Hx     Social History   Socioeconomic History   Marital status: Widowed    Spouse name: Not on file   Number of children: 2   Years of education: Not on file   Highest education level: Not on file  Occupational History   Occupation: DISABILITY    Employer: UNEMPLOYED  Tobacco Use   Smoking status: Former    Current packs/day: 0.00    Average packs/day: 0.5 packs/day for 18.0 years (9.0 ttl pk-yrs)    Types: Cigarettes    Start date: 07/29/1977     Quit date: 06/25/1994    Years since quitting: 29.4   Smokeless tobacco: Never   Tobacco comments:    quit 19 years ago  Vaping Use   Vaping status: Never Used  Substance and Sexual Activity   Alcohol use: Yes    Alcohol/week: 0.0 standard drinks of alcohol    Comment: social drinker   Drug use: No   Sexual activity: Not Currently  Other Topics Concern   Not on file  Social History Narrative   Widowed   Has 2 grown children.  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    Former Smoker Quit tobacco in 1996.  She was a pack a day smoker for approximately 10 years.    Alcohol use-yes: Social    Daily Caffeine Use:6 pack of pepsi daily     Illicit Drug Use - no    Patient does not get regular exercise.       Smoking Status:  quit   Social Drivers of Health   Financial Resource Strain: Low Risk  (01/16/2023)   Overall Financial Resource Strain (CARDIA)    Difficulty of Paying Living Expenses: Not hard at all  Food Insecurity: No Food Insecurity (01/16/2023)   Hunger Vital Sign    Worried About Running Out of Food in the Last Year: Never true    Ran Out of Food in the Last Year: Never true  Transportation Needs: No Transportation Needs (01/16/2023)   PRAPARE - Administrator, Civil Service (Medical): No    Lack of Transportation (Non-Medical): No  Physical Activity: Inactive (06/10/2023)   Exercise Vital Sign    Days of Exercise per Week: 0 days    Minutes of Exercise per Session: 0 min  Stress: No Stress Concern Present (01/16/2023)   [employer] of Occupational Health - Occupational Stress Questionnaire    Feeling of Stress : Not at all  Social Connections: Moderately Isolated (06/10/2023)   Social Connection and Isolation Panel    Frequency of Communication with Friends and Family: More than three times a week    Frequency of Social Gatherings with Friends and Family: More than three times a week    Attends Religious  Services: More than 4 times per year    Active Member of [employer] or Organizations: No    Attends Banker Meetings: Never    Marital Status: Widowed  Intimate Partner Violence: Not At Risk (06/08/2022)   Humiliation, Afraid, Rape, and Kick questionnaire    Fear of Current or Ex-Partner:  No    Emotionally Abused: No    Physically Abused: No    Sexually Abused: No     Allergies  Allergen Reactions   Metformin  Anxiety    Vomiting, Anxiety, Cramping, Diarrhea   Ace Inhibitors Cough   Celebrex [Celecoxib] Other (See Comments)    makes me bleed   Diovan  [Valsartan ]     angioedema    ROS See HPI    Objective:    Physical Exam Constitutional:      General: She is not in acute distress.    Appearance: Normal appearance. She is well-developed.  HENT:     Head: Normocephalic and atraumatic.     Right Ear: External ear normal.     Left Ear: External ear normal.   Eyes:     General: No scleral icterus.  Neck:     Thyroid : No thyromegaly.   Cardiovascular:     Rate and Rhythm: Normal rate and regular rhythm.     Heart sounds: Normal heart sounds. No murmur heard. Pulmonary:     Effort: Pulmonary effort is normal. No respiratory distress.     Breath sounds: Normal breath sounds. No wheezing.   Musculoskeletal:     Cervical back: Neck supple.   Skin:    General: Skin is warm and dry.   Neurological:     Mental Status: She is alert and oriented to person, place, and time.   Psychiatric:        Mood and Affect: Mood normal.        Behavior: Behavior normal.        Thought Content: Thought content normal.        Judgment: Judgment normal.      BP 128/69 (BP Location: Right Arm, Patient Position: Sitting, Cuff Size: Normal)   Pulse 67   Temp 98.8 F (37.1 C) (Oral)   Resp 16   Ht 5' 4 (1.626 m)   Wt 219 lb (99.3 kg)   SpO2 100%   BMI 37.59 kg/m  Wt Readings from Last 3 Encounters:  12/06/23 219 lb (99.3 kg)  10/25/23 222 lb (100.7 kg)   10/21/23 225 lb 9.6 oz (102.3 kg)       Assessment & Plan:   Problem List Items Addressed This Visit       Unprioritized   Vitamin D  deficiency   Not on supplement. Update level.       Relevant Orders   VITAMIN D  25 Hydroxy (Vit-D Deficiency, Fractures)   Migraine headache   No recent migraines, continues imitrex  prn.       Lumbar radiculopathy   Continues PT.       Iron  deficiency anemia   Lab Results  Component Value Date   WBC 7.2 10/21/2023   HGB 11.6 10/21/2023   HCT 37.9 10/21/2023   MCV 73 (L) 10/21/2023   PLT 220 10/21/2023         Relevant Orders   CBC w/Diff   Iron , TIBC and Ferritin Panel   Hyperthyroidism   Lab Results  Component Value Date   TSH 1.87 09/13/2023   Management per Endo, maintained on tapazole .       Hyperlipidemia   Lab Results  Component Value Date   CHOL 137 08/09/2023   HDL 41.80 08/09/2023   LDLCALC 67 08/09/2023   LDLDIRECT 76 07/21/2019   TRIG 144.0 08/09/2023   CHOLHDL 3 08/09/2023   Lipids stable on repatha . Continue same.       GERD  Stable on pantoprazole . Continue same.      Relevant Medications   metoCLOPramide  (REGLAN ) 5 MG tablet   Gastroparesis   Fair symptom management on reglan  an prn zofran .       Fibromyalgia   Fair control- continue cymbalta .       Essential hypertension - Primary   Stable on amlodipine  and chlorthalidone . Continue same.      Coronary artery disease of native heart with stable angina pectoris (HCC)   Management per cardiology- maintained on imdur  and aspirin  81mg .       Controlled type 2 diabetes mellitus without complication, without long-term current use of insulin  (HCC)   Lab Results  Component Value Date   HGBA1C 7.1 (H) 08/09/2023   HGBA1C 6.2 02/03/2022   HGBA1C 6.8 (H) 12/01/2021   Lab Results  Component Value Date   MICROALBUR 3.6 (H) 08/09/2023   LDLCALC 67 08/09/2023   CREATININE 0.74 10/21/2023   Maintained on Farxiga . Continue same. Update  A1C.      Relevant Orders   HgB A1c   Anemia   Relevant Orders   CBC w/Diff   Iron , TIBC and Ferritin Panel    I have discontinued Angline G. Badilla's cephALEXin . I have also changed her metoCLOPramide . Additionally, I am having her maintain her aspirin , loratadine , fluticasone , alclomethasone, clobetasol , albuterol , Xiidra, furosemide , isosorbide  mononitrate, pantoprazole , Blood Glucose Monitoring Suppl, Accu-Chek Softclix Lancets, tretinoin , chlorthalidone , potassium chloride  SA, meclizine , levothyroxine, SUMAtriptan , DULoxetine , rosuvastatin , Accu-Chek Guide Test, methimazole , amLODipine , Repatha  SureClick, and Farxiga .  Meds ordered this encounter  Medications   metoCLOPramide  (REGLAN ) 5 MG tablet    Sig: TAKE 1 TABLET BY MOUTH THREE TIMES DAILY BEFORE MEAL(S). SCHEDULE WITH GI FOR FURTHER REFILLS    Supervising Provider:   Randie Bustle A [[id number]]

## 2023-12-07 LAB — CBC WITH DIFFERENTIAL/PLATELET
Absolute Lymphocytes: 2804 {cells}/uL (ref 850–3900)
Absolute Monocytes: 507 {cells}/uL (ref 200–950)
Basophils Absolute: 26 {cells}/uL (ref 0–200)
Basophils Relative: 0.3 %
Eosinophils Absolute: 69 {cells}/uL (ref 15–500)
Eosinophils Relative: 0.8 %
HCT: 40.4 % (ref 35.0–45.0)
Hemoglobin: 12.1 g/dL (ref 11.7–15.5)
MCH: 22.1 pg — ABNORMAL LOW (ref 27.0–33.0)
MCHC: 30 g/dL — ABNORMAL LOW (ref 32.0–36.0)
MCV: 73.9 fL — ABNORMAL LOW (ref 80.0–100.0)
MPV: 11.2 fL (ref 7.5–12.5)
Monocytes Relative: 5.9 %
Neutro Abs: 5194 {cells}/uL (ref 1500–7800)
Neutrophils Relative %: 60.4 %
Platelets: 252 10*3/uL (ref 140–400)
RBC: 5.47 10*6/uL — ABNORMAL HIGH (ref 3.80–5.10)
RDW: 14.5 % (ref 11.0–15.0)
Total Lymphocyte: 32.6 %
WBC: 8.6 10*3/uL (ref 3.8–10.8)

## 2023-12-07 LAB — HEMOGLOBIN A1C
Hgb A1c MFr Bld: 7.3 % — ABNORMAL HIGH (ref <5.7)
Mean Plasma Glucose: 163 mg/dL
eAG (mmol/L): 9 mmol/L

## 2023-12-07 LAB — IRON,TIBC AND FERRITIN PANEL
%SAT: 12 % — ABNORMAL LOW (ref 16–45)
Ferritin: 948 ng/mL — ABNORMAL HIGH (ref 16–288)
Iron: 36 ug/dL — ABNORMAL LOW (ref 45–160)
TIBC: 293 ug/dL (ref 250–450)

## 2023-12-07 LAB — VITAMIN D 25 HYDROXY (VIT D DEFICIENCY, FRACTURES): Vit D, 25-Hydroxy: 21 ng/mL — ABNORMAL LOW (ref 30–100)

## 2023-12-09 ENCOUNTER — Ambulatory Visit: Payer: Self-pay | Admitting: Family

## 2023-12-09 DIAGNOSIS — D509 Iron deficiency anemia, unspecified: Secondary | ICD-10-CM

## 2023-12-09 DIAGNOSIS — E559 Vitamin D deficiency, unspecified: Secondary | ICD-10-CM

## 2023-12-09 MED ORDER — MULTI-VITAMIN/MINERALS PO TABS
1.0000 | ORAL_TABLET | Freq: Every day | ORAL | Status: AC
Start: 2023-12-09 — End: ?

## 2023-12-09 MED ORDER — VITAMIN D (ERGOCALCIFEROL) 1.25 MG (50000 UNIT) PO CAPS: 50000.0000 [IU] | ORAL_CAPSULE | ORAL | 0 refills | Status: DC

## 2023-12-09 NOTE — Telephone Encounter (Signed)
 Sugar is up slightly.  Please work on reducing sugars/white carbs in diet, exercise and weight loss.  Iron  level is a bit low.  Please add a multivitamin with minerals once daily.

## 2023-12-09 NOTE — Telephone Encounter (Signed)
Vitamin D level is low.  Advise patient to begin vit D 50000 units once weekly for 12 weeks, then repeat vit D level (dx Vit D deficiency).     

## 2023-12-12 ENCOUNTER — Telehealth: Payer: Self-pay | Admitting: Gastroenterology

## 2023-12-12 ENCOUNTER — Telehealth: Payer: Self-pay | Admitting: Physician Assistant

## 2023-12-12 NOTE — Telephone Encounter (Signed)
 PT is reaching out after being told that we were trying to reach out to her. Please advise.

## 2023-12-12 NOTE — Telephone Encounter (Signed)
 Returned call to pt.  She has been made aware of her Cardiac Stress Pet results.  She was also advised to place a call to GI, as they have been trying to reach pt as well.  Pt will leave medications as they are and let us  know of any worsening symptoms.

## 2023-12-12 NOTE — Telephone Encounter (Signed)
 Pt calling in for stress test results

## 2023-12-12 NOTE — Telephone Encounter (Signed)
 I spoke to the pt and made her aware that I see no messages where we have tried to reach the pt.  I did advise if she needs us  to please call and let our office know.

## 2023-12-13 ENCOUNTER — Telehealth: Payer: Self-pay | Admitting: Family

## 2023-12-13 ENCOUNTER — Ambulatory Visit: Admitting: Physical Therapy

## 2023-12-13 ENCOUNTER — Encounter: Payer: Self-pay | Admitting: Physical Therapy

## 2023-12-13 DIAGNOSIS — M62838 Other muscle spasm: Secondary | ICD-10-CM | POA: Diagnosis not present

## 2023-12-13 DIAGNOSIS — M5416 Radiculopathy, lumbar region: Secondary | ICD-10-CM | POA: Diagnosis not present

## 2023-12-13 DIAGNOSIS — M6281 Muscle weakness (generalized): Secondary | ICD-10-CM | POA: Diagnosis not present

## 2023-12-13 NOTE — Therapy (Addendum)
 OUTPATIENT PHYSICAL THERAPY TREATMENT / DISCHARGE SUMMARY  Progress Note  Reporting Period 10/18/2023 to 12/13/2023   See note below for Objective Data and Assessment of Progress/Goals.     Patient Name: [name] MRN: [id number] DOB:May 31, 1961, 63 y.o., female Today's Date: 12/13/2023  END OF SESSION:  PT End of Session - 12/13/23 0846     Visit Number 7    Date for PT Re-Evaluation 12/13/23    Authorization Type [employer]    PT Start Time [phone]    PT Stop Time 0924    PT Time Calculation (min) 38 min    Activity Tolerance Patient tolerated treatment well    Behavior During Therapy WFL for tasks assessed/performed                Past Medical History:  Diagnosis Date   Allergy    allergic rhinitis   Arthritis    Atypical chest pain    Constipation    Cyst, ovarian    Diabetes (HCC)    Diabetes mellitus, type II (HCC)    Elevated alkaline phosphatase level    Endometriosis    Esophageal stricture    Fatty liver    Gastroparesis    GERD (gastroesophageal reflux disease)    Heart murmur    Hepatic hemangioma    Hyperlipidemia    Hypertension    Hypertrophic condition of skin    acrokeratoelastoidosis- s/p 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 1/08- benign   Iron  deficiency anemia    Migraine headache    Non-compliance    Peptic ulcer disease    Thyroid  disease    Past Surgical History:  Procedure Laterality Date   ABDOMINAL EXPLORATION SURGERY     w/bso    BREAST BIOPSY     CARDIAC CATHETERIZATION  2009   mild non obstructive CAD   CHOLECYSTECTOMY     COLONOSCOPY     ESOPHAGEAL MANOMETRY N/A 08/01/2020   Procedure: ESOPHAGEAL MANOMETRY (EM);  Surgeon: Leigh Elspeth SQUIBB, MD;  Location: [facility];  Service: Gastroenterology;  Laterality: N/A;   KNEE SURGERY  2005    left knee   LEFT HEART CATH AND CORONARY ANGIOGRAPHY N/A 07/17/2017   Procedure: LEFT HEART CATH AND CORONARY ANGIOGRAPHY;  Surgeon: Jordan, Peter M, MD;  Location: [facility]  INVASIVE CV LAB;  Service: Cardiovascular;  Laterality: N/A;   LEFT HEART CATH AND CORONARY ANGIOGRAPHY N/A 09/01/2021   Procedure: LEFT HEART CATH AND CORONARY ANGIOGRAPHY;  Surgeon: Verlin Lonni BIRCH, MD;  Location: [facility] INVASIVE CV LAB;  Service: Cardiovascular;  Laterality: N/A;   POLYPECTOMY     TOTAL ABDOMINAL HYSTERECTOMY     UPPER GASTROINTESTINAL ENDOSCOPY     Patient Active Problem List   Diagnosis Date Noted   Unilateral primary osteoarthritis, left knee 10/04/2023   Type 2 diabetes mellitus with hyperglycemia, without long-term current use of insulin  (HCC) 08/16/2023   Dysfunction of both eustachian tubes 08/16/2023   Positive ANA (antinuclear antibody) 03/22/2023   Sedimentation rate elevation 03/22/2023   Bulging of lumbar intervertebral disc 09/24/2022   Arthritis 05/11/2022   Controlled type 2 diabetes mellitus without complication, without long-term current use of insulin  (HCC) 12/01/2021   Coronary artery disease of native heart with stable angina pectoris (HCC) 06/30/2021   Hypercontractile esophagus    Aortic atherosclerosis (HCC) 07/17/2017   Insomnia 01/12/2017   Hyperthyroidism 02/29/2012   Esophageal dysphagia 04/18/2011   Ulnar neuropathy 03/30/2011   Lumbar radiculopathy 10/18/2010   Fibromyalgia 07/20/2009   Hyperlipidemia 05/26/2009  Vitamin D  deficiency 05/23/2009   Polyarthralgia 04/12/2009   Iron  deficiency anemia 10/25/2008   GERD 10/25/2008   LIVER HEMANGIOMA 01/01/2008   ESOPHAGEAL STRICTURE 01/01/2008   Gastroparesis 01/01/2008   Anemia 12/08/2007   Allergic rhinitis 12/08/2007   Migraine headache 08/11/2007   HIATAL HERNIA 08/11/2007   OVARIAN CYST 08/11/2007   Essential hypertension 05/06/2007    PCP: Daryl Setter, NP  REFERRING PROVIDER: Colon Shove, MD  REFERRING DIAG: M54.16 (ICD-10-CM) - Radiculopathy, lumbar region   RATIONALE FOR EVALUATION AND TREATMENT: Rehabilitation  THERAPY DIAG:  Radiculopathy, lumbar  region  Muscle weakness (generalized)  Other muscle spasm  ONSET DATE: > 1 year  NEXT MD VISIT: ~June 2025  SUBJECTIVE:                                                                                                                                                                                           SUBJECTIVE STATEMENT: Pt reports she had LB spasms for the past few day w/o known trigger.  PERTINENT HISTORY:  Arthritis, DM-II, Endometriosis, GERD, HTN, heart murmur, iron  deficiency anemia, migraine headaches, fibromyalgia  PAIN:  Are you having pain? Yes: NPRS scale: 7/10  Pain location: B lower back Pain description: dull pain Aggravating factors: Stand >1hr, walking 2-3 mi Relieving factors: Heat, massaging  PRECAUTIONS: None  RED FLAGS: None   WEIGHT BEARING RESTRICTIONS: No  FALLS:  Has patient fallen in last 6 months? No  LIVING ENVIRONMENT: Lives with: lives alone Lives in: House/apartment Stairs: Yes: External: 6 steps; none Has following equipment at home: None  OCCUPATION: Retired  PLOF: Independent and Leisure: Had to quit work, gym, exercise  PATIENT GOALS: Not to have to the pain   OBJECTIVE:  Note: Objective measures were completed at Evaluation unless otherwise noted.  DIAGNOSTIC FINDINGS:  02/28/23 CLINICAL DATA:  Acute left-sided low back pain with left-sided sciatica. Left hip pain.   EXAM: LUMBAR SPINE - COMPLETE 4+ VIEW   COMPARISON:  Radiographs 04/07/2021   FINDINGS: Five non-rib-bearing lumbar vertebra. Lumbar alignment is normal. Vertebral body heights are normal. Anterior spurring at multiple levels most prominent at L5-S1. The disc spaces are preserved. No evidence of focal bone lesion, fracture or pars defects. Aortic atherosclerosis.   IMPRESSION: Mild spondylosis with anterior spurring most prominent at L5-S1.  PATIENT SURVEYS:  Modified Oswestry 14/50 = 28%   COGNITION: Overall cognitive status: Within  functional limits for tasks assessed     SENSATION: Proprioception: WFL, standing for too long gets numbness  MUSCLE LENGTH: Quad: R=L severe tightness HS: mod tight B ITB: R mild tightness  POSTURE: rounded shoulders, forward head, flexed  trunk , and weight shift right  PALPATION: TTP in L5-S1, R QL, upper glutes  LUMBAR ROM:   AROM eval 11/29/23  Flexion Above ankles, p! ~Ankles  Extension Limited, p! WFL  Right lateral flexion Lateral knee Fibular head  Left lateral flexion Lateral knee, p! R side Fibular head  Right rotation [facility] WFL  Left rotation [facility] WFL   (Blank rows = not tested)  LOWER EXTREMITY MMT:    MMT Right eval Left eval R 11/29/23 L 11/29/23  Hip flexion 5 4- 4+ 5  Hip extension 4- 4 4+ 4+  Hip abduction 4- 4 4+ 4+  Hip adduction 3+ sharp p! 4 4+ 4+  Hip internal rotation 3+ sharp p! 4- 4+ dull p! 4+  Hip external rotation 3+ sharp p! 4- 4+ 4+  Knee flexion 4- 4 4+ 4+  Knee extension 4- 4 4+ 4+  Ankle dorsiflexion 4- p! 5 4+ 5  Ankle plantarflexion      Ankle inversion      Ankle eversion       (Blank rows = not tested)  LUMBAR SPECIAL TESTS:  Straight leg raise test: Positive R  GAIT: Distance walked: Front desk to treatment room 1 Assistive device utilized: None Level of assistance: Complete Independence Comments: Shifts weight to R to alleviate pain  TREATMENT DATE:                                                                                                                                12/13/23 THERAPEUTIC EXERCISE: To improve strength and endurance.  Demonstration, verbal and tactile cues throughout for technique.  NuStep L5 x 6 min UE/LE  MANUAL THERAPY: To promote normalized muscle tension, improved flexibility, and reduced pain utilizing connective tissue massage, therapeutic massage, and kinesiotaping.  STM/DTM and IASTM with foam roller to B lumbar paraspinals K-please- 2 I strips 30-50% to B lumbar paraspinals  SELF CARE: Provided  education to prevent loss of gains achieved with physical therapy, to prevent future decline in function, and Kinesiotape wearing instructions.  Verbally reviewed current HEP clarifying recommended frequency for ongoing performance to maintain gains achieved with PT and prevent future decline in function. Provided education in role of Kinesiotape for reducing muscle tension and associated pain/muscle spasm, along with wearing and removal instructions - handout provided.   12/06/23 THERAPEUTIC EXERCISE: To improve strength, endurance, ROM, and flexibility.  Demonstration, verbal and tactile cues throughout for technique. Nustep L5x85min UE/LE S/L reverse clam RTB 2x10 Cat/cow quadruped x 10  NEUROMUSCULAR RE-EDUCATION: To improve balance, coordination, kinesthesia, and posture. Standing alt hip extension leaning into wall ladder x 10 Modified bird dog leaning into wall ladder x 10 Standing TrA + GTB scap retraction and B shoulder row 2 x 10, Standing TrA + GTB scap retraction and B shoulder extension 2 x 10,  Standing GTB pallof press 2 x 10 bil Standing GTB trunk rotation x 10 bil Leg press 20lb BLE  x 20  MANUAL THERAPY: To promote normalized muscle tension, improved flexibility, and reduced pain utilizing connective tissue massage, therapeutic massage, and manual TP therapy.  STM/DTM to B lumbar paraspinals and QL   MOIST HEAT: 10 min to low back in prone   11/29/23 THERAPEUTIC EXERCISE: To improve strength, endurance, ROM, and flexibility.  Demonstration, verbal and tactile cues throughout for technique. Rec Bike - L2 x 6 min Supine R glute stretch 2 x 30 Supine R KTOS piriformis stretch 2 x 30  MANUAL THERAPY: To promote normalized muscle tension, improved flexibility, and reduced pain utilizing connective tissue massage, therapeutic massage, and manual TP therapy.  STM/DTM and manual TPR to R upper glutes  NEUROMUSCULAR RE-EDUCATION: To improve balance, coordination, kinesthesia,  and posture. Standing alt hip extension leaning into wall ladder x 10 Modified bird dog leaning into wall ladder x 10 Standing TrA + GTB scap retraction and B shoulder row 2 x 10, 2nd set in staggered stance Standing TrA + GTB scap retraction and B shoulder extension 2 x 10, 2nd set in staggered stance Standing GTB pallof press x 10 bil Standing GTB pallof press + short arc trunk rotation x 10 bil Functional squat + B UE OH raise 2 x 10   PATIENT EDUCATION:  Education details: HEP review, recommended frequency for ongoing HEP at discharge to prevent loss of gains achieved with PT, posture and body mechanics for typical daily postioning, mobility and household tasks, self-STM techniques to lumbar paraspinals or glutes using foam roller or tennis ball on wall, and Ktape wearing and removal instructions Person educated: Patient Education method: Programmer, Multimedia, Demonstration, Verbal cues, and Handouts Education comprehension: verbalized understanding  HOME EXERCISE PROGRAM: Access Code: [email or web address] URL: [email or web address] Date: 12/13/2023 Prepared by: Elijah Hidden  Exercises - Supine Hamstring Stretch with Strap  - 1 x daily - 7 x weekly - 2 sets - 5 reps - 30 hold - Supine Piriformis Stretch with Leg Straight  - 2 x daily - 7 x weekly - 2 sets - 30 sec hold - Supine Lower Trunk Rotation  - 1 x daily - 7 x weekly - 2 sets - 10 reps - 3-5 hold - Modified Thomas Stretch  - 1 x daily - 7 x weekly - 2 sets - 30 hold - Supine Hip Adduction Isometric with Ball  - 1 x daily - 7 x weekly - 2 sets - 10 reps - 5 sec hold - Hooklying Clamshell with Resistance  - 1 x daily - 7 x weekly - 2 sets - 10 reps - 3 sec hold - Bridge with Resistance  - 1 x daily - 3 x weekly - 2 sets - 10 reps - 5 sec hold - Clam with Resistance  - 1 x daily - 3 x weekly - 2 sets - 10 reps - 3-5 sec hold - Sidelying Reverse Clamshell with Resistance  - 1 x daily - 3 x weekly - 2 sets - 10 reps - 3-5 sec hold -  Quadruped Cat Cow  - 1 x daily - 3 x weekly - 2 sets - 10 reps - 3 sec hold - Standing Shoulder Row with Anchored Resistance  - 1 x daily - 3 x weekly - 2 sets - 10 reps - Shoulder Extension with Anchored Resistance  - 1 x daily - 3 x weekly - 2 sets - 10 reps  Patient Education - Posture and Body Mechanics   ASSESSMENT:  CLINICAL IMPRESSION: Saralyn some increased muscle spasms in  her lumbar paraspinals over the past week, which seem to occur randomly without identifiable trigger, but still notes overall improvement in her LBP by ~80%.   Gains noted on modified Oswestry with current score 11/50 indicating 22% disability - improved from eval but shy of associated LTG.  MT performed focusing on reducing muscle tension and spasms in B lumbar paraspinals followed by Kinesiotape application to continue to promote reduction in muscle tension.  Written wearing and removal instructions provided for Kinesiotape.  Katerra still feeling that she is ready to transition to her HEP as initially planned, therefore verbally reviewed recommended ongoing HEP performance frequency to maintain gains achieved with PT and prevent future decline in function.  All PT goals now met with exception of modified Oswestry goal and Jaida feels ready to transition to her HEP but would like to remain on hold for 30-days in the event that issues arise that would necessitate a return to PT.   IE: Jettie is a 64 y.o. female who was seen today for physical therapy evaluation and treatment for LBP with radiculopathy. She reports LBP began several years ago after a fall. Has previous PT experience with having an episode of LBP with radiculopathy back in 2019. Pain is worse with movement and bending over. She has issues with coming back up from bending, having to crawl up her body to stand up straight. She has a tendency to shift her weight to R side to alleviate pain when sitting or standing. The pain interferes with her ability to work  and participate in extracurricular activities that she enjoys. Melah is limited lumbar ROM and shows deficits in LE strength, abnormal posture and TTP with anormal muscle tension around L5-S1 paraspinals, R upper glutes, and R QL. On Modified Oswestry, she scored 14/50 demonstrating 28% or moderate disability. Blossom will benefit from skilled PT to address above deficits to improve mobility and activity tolerance with decreased pain interference.    OBJECTIVE IMPAIRMENTS: Abnormal gait, decreased activity tolerance, decreased coordination, decreased endurance, decreased knowledge of condition, decreased mobility, difficulty walking, decreased ROM, decreased strength, increased fascial restrictions, impaired perceived functional ability, increased muscle spasms, impaired flexibility, impaired sensation, impaired tone, improper body mechanics, postural dysfunction, and pain.   ACTIVITY LIMITATIONS: lifting, bending, sitting, standing, sleeping, and locomotion level  PARTICIPATION LIMITATIONS: meal prep, cleaning, laundry, interpersonal relationship, community activity, and occupation  PERSONAL FACTORS: Past/current experiences, Time since onset of injury/illness/exacerbation, and 3+ comorbidities: Arthritis, DM-II, Endometriosis, GERD, HTN, heart murmur, iron  deficiency anemia, migraine headaches, fibromyalgia are also affecting patient's functional outcome.   REHAB POTENTIAL: Good  CLINICAL DECISION MAKING: Evolving/moderate complexity  EVALUATION COMPLEXITY: Moderate   GOALS: Goals reviewed with patient? Yes  SHORT TERM GOALS: Target date: 11/15/2023  1.  Pt will be independent with initial HEP to improve outcomes and carryover Baseline:  Goal status: MET - 11/15/23  2.  Pt will report 25% improvement in low back pain to improve QOL Baseline:  Goal status: MET - 11/08/23  LONG TERM GOALS: Target date: 12/13/2023  1.  Pt will be independent with ongoing/advanced HEP for self-management  at home Baseline:  11/15/23 - met for current HEP Goal status: MET - 12/13/23   2.  Pt will report 50-75% improvement in low back pain to improve QOL  Baseline: 5/10 currently, 10/10 worst 11/15/23 - almost 100% improvement Goal status: MET - 12/13/23 - noting more muscle spasms of late, but still ~80% improved  3.  Pt will demonstrate improved LE strength to >/=  4+/5 for improved stability and ease of mobility  Baseline: Refer to LE MMT Goal status: MET - 11/29/23   4.  Pt will report </= 8/50 or 16% on Modified Oswestry (MCID = 12%) to demonstrate improved functional ability with decreased pain interference Baseline: 14/50 = 28% Goal status: NOT MET - 12/13/23 - 11 / 50 = 22.0 %  5.  Pt to demonstrate ability to achieve and maintain good spinal alignment/posturing and body mechanics needed for daily activities  Baseline:  11/15/23 - provided education in proper posture and body mechanics for typical daily positioning and household chores to minimize strain on low back.  Goal status: MET - 11/29/23    PLAN:  PT FREQUENCY: 1-2x/week  PT DURATION: 8 weeks  PLANNED INTERVENTIONS: 97164- PT Re-evaluation, 97110-Therapeutic exercises, 97530- Therapeutic activity, 97112- Neuromuscular re-education, 97535- Self Care, [id number]- Manual therapy, [id number]- Aquatic Therapy, H9716- Electrical stimulation (unattended), 97016- Vasopneumatic device, [id number]- Ultrasound, [id number]- Traction (mechanical), [id number]- Ionotophoresis 4mg /ml Dexamethasone , Patient/Family education, Taping, Dry Needling, Joint mobilization, Spinal manipulation, Spinal mobilization, Cryotherapy, and Moist heat.   PLAN FOR NEXT SESSION: transition to HEP + 30-day hold   Elijah CHRISTELLA Hidden, PT 12/13/2023, 9:42 AM     PHYSICAL THERAPY DISCHARGE SUMMARY  Visits from Start of Care: 7  Current functional level related to goals / functional outcomes: Refer to above clinical impression and goal assessment for status as of last visit on 12/13/2023.  Patient was placed on hold for 30 days and has not needed to return to PT, therefore will proceed with discharge from PT for this episode.     Remaining deficits: As above.  Modified Oswestry improved but still demonstrating 22% disability.   Education / Equipment: Dietitian education  Patient agrees to discharge. Patient goals were mostly met. Patient is being discharged due to being pleased with the current functional level.  Elijah EMERSON Hidden, PT 05/27/2024, 3:53 PM  [facility] [address]  [address] [place], [place], [address] Phone: [phone]   Fax:  [phone]

## 2023-12-13 NOTE — Telephone Encounter (Signed)
 Pt requesting some antibiotics for maybe boil on lower stomach. Pt asked to be called when medicine is called in. [facility] on [address].

## 2023-12-16 MED ORDER — CEPHALEXIN 500 MG PO CAPS
500.0000 mg | ORAL_CAPSULE | Freq: Two times a day (BID) | ORAL | 0 refills | Status: AC
Start: 2023-12-16 — End: 2023-12-23

## 2023-12-16 NOTE — Telephone Encounter (Signed)
 Rx sent for Keflex .  Apply warm compressed bid to affected area. Schedule office visit if symptoms worsen or do not improve with Keflex .

## 2023-12-16 NOTE — Telephone Encounter (Signed)
No answer/vm full

## 2023-12-16 NOTE — Telephone Encounter (Signed)
 Patient notified of prescription.

## 2023-12-18 ENCOUNTER — Other Ambulatory Visit: Payer: Self-pay | Admitting: Family

## 2023-12-19 DIAGNOSIS — H9203 Otalgia, bilateral: Secondary | ICD-10-CM | POA: Diagnosis not present

## 2023-12-19 DIAGNOSIS — Z9622 Myringotomy tube(s) status: Secondary | ICD-10-CM | POA: Diagnosis not present

## 2023-12-19 DIAGNOSIS — L929 Granulomatous disorder of the skin and subcutaneous tissue, unspecified: Secondary | ICD-10-CM | POA: Diagnosis not present

## 2023-12-20 ENCOUNTER — Other Ambulatory Visit

## 2023-12-20 DIAGNOSIS — E0590 Thyrotoxicosis, unspecified without thyrotoxic crisis or storm: Secondary | ICD-10-CM | POA: Diagnosis not present

## 2023-12-20 DIAGNOSIS — M5416 Radiculopathy, lumbar region: Secondary | ICD-10-CM | POA: Diagnosis not present

## 2023-12-20 LAB — TSH: TSH: 0.97 m[IU]/L (ref 0.40–4.50)

## 2023-12-20 LAB — T3, FREE: T3, Free: 3.7 pg/mL (ref 2.3–4.2)

## 2023-12-20 LAB — T4, FREE: Free T4: 1.2 ng/dL (ref 0.8–1.8)

## 2023-12-21 ENCOUNTER — Other Ambulatory Visit: Payer: Self-pay | Admitting: Family

## 2023-12-21 DIAGNOSIS — E1165 Type 2 diabetes mellitus with hyperglycemia: Secondary | ICD-10-CM

## 2023-12-21 NOTE — Progress Notes (Unsigned)
 Cardiology Office Note    Date:  12/23/2023  ID:  [name], DOB 1960-10-24, MRN [id number] PCP:  Denise Setter, NP  Cardiologist:  Denise Gull, MD  Electrophysiologist:  None   Chief Complaint: f/u chest pain  History of Present Illness: .    [name] is a 63 y.o. female with visit-pertinent history of predominantly nonobstructive CAD, longstanding chest pain, esophageal stricture, DM2, endometriosis, HTN, inability to use ACE/ARB, HLD, thyroid  disease, fatty liver, IDA, migraine, borderline dilation of aortic root not seen on subsequent study, liver hemangioma, hiatal hernia, sleep apnea by 2024 sleep study, carotid artery disease by CTA 07/2022, epidermodysplasia verruciformis seen for follow-up.    She has had episodic evaluations (cath/stress/CT) for chronic chest pain over the years going back to 2009: - Last cath 08/2021 showed mild nonobstructive disease in the LAD and Cx, moderate intermediate branch unchanged from 2019 cath, small caliber PDA too small for PCI, managed medically.  - Started on low dose Lasix  PRN by APP 07/2022 due to mild volume overload in setting of obesity. - Cor CTA 08/2022 showed CAC 1504/99%ile, nonobstructive CAD (aortic root calcifications, moderate mixed plaque inprox RCA, mild diffuse m-distal RCA, minimal LAD plaque with short area of mid-distal LAD with myocardial bridging, mild calcification in the prox-mid LAD (?LCx), also incidentally noted circumferential thickening of the esophagus, underlying mass not excluded, similar to a prior CT. Endoscopy 03/2022 for similar previous abnormal CT findings had shown 1cm HH, benign appearing blebs in lower esophagus, dilation of esophagus, mucosal changes of duodenum. - ER visit 07/2023 for fleeting sharp sided left chest pain with left arm discomfort with mixed features. She had negative troponins and reassuring EKG. CTA head/neck 07/2023 for suspected peripheral dizziness showed atheromatous change  about the carotid siphons with associated moderate to severe stenoses bilaterally, aortic atherosclerosis. - At OV with me 08/2023, she was reporting elevated blood pressures so we added chlorthalidone . She cannot take ACE due to cough and ARB due to angioedema. HR had been in the 50s, precluding addition of BB. Echo done 08/2023 for surveillance of aortic root size showed EF 67%, G1DD, normal aortic size.  - Cardiac PET 10/2023 for continued chest pain was low risk without evidence for ischemia, EF 59%. The overread continued to show thickening in the distal esophagus and hepatic steatosis so I advised ongoing f/u with GI.  - She had also been seeing ENT for vertigo and had ear tubes placed without significant change in persistent dizziness. This persists despite normal VS/stable EKG. I also referred to neurology given previous cerebrovascular disease seen on imaging. This is pending.   She is seen for follow-up feeling about the same. She has been playing phone tag with GI - they tried to contact her and were unsuccessful several times. When she called them back, they hadn't seen that she had been called by their office. She continues to have episodic non-exertional chest pains in various locations lasting 3 to 20 minutes, relieved spontaneously without anything in particular. There is no particular pattern to provocation. She has had to take her PRN Lasix  a few times over the last week due to DOE. She is feeling well today.  Labwork independently reviewed: 11/2023 TSH OK, Hgb 12.1, plt OK, A1c 7.3 09/2023 K 3.9, Cr 0.74 07/2023 LDL 67, trig 144, A1c 7.1, trops neg, Hgb 11.8, plt 235, K 4.1, Cr 0.98, TSH OK    ROS: .    Please see the history of present illness.  All other systems are reviewed and otherwise negative.  Studies Reviewed: [name]    EKG:  EKG is ordered today, personally reviewed, demonstrating  EKG Interpretation Date/Time:  Monday December 23 2023 15:09:11 EDT Ventricular Rate:  80 PR  Interval:  158 QRS Duration:  86 QT Interval:  412 QTC Calculation: 475 R Axis:   -23  Text Interpretation: Normal sinus rhythm Inferior infarct (cited on or before 21-Oct-2023) Cannot rule out Anterior infarct (cited on or before 21-Oct-2023) No acute change from prior Confirmed by [name] [phone]) on 12/23/2023 3:11:39 PM    CV Studies: Cardiac studies reviewed are outlined and summarized above. Otherwise please see EMR for full report.   Current Reported Medications:.    Current Meds  Medication Sig   Accu-Chek Softclix Lancets lancets 3 (three) times daily.   albuterol  (VENTOLIN  HFA) 108 (90 Base) MCG/ACT inhaler INHALE 2 PUFFS BY MOUTH 4 TIMES DAILY   alclomethasone (ACLOVATE ) 0.05 % cream Apply topically 2 (two) times daily as needed (Rash).   amLODipine  (NORVASC ) 10 MG tablet Take 1 tablet (10 mg total) by mouth daily.   aspirin  81 MG chewable tablet Chew 1 tablet (81 mg total) by mouth daily.   Blood Glucose Monitoring Suppl DEVI 1 each by Does not apply route in the morning, at noon, and at bedtime. May substitute to any manufacturer covered by patient's insurance.   cephALEXin  (KEFLEX ) 500 MG capsule Take 1 capsule (500 mg total) by mouth 2 (two) times daily for 7 days.   chlorthalidone  (HYGROTON ) 25 MG tablet Take 1 tablet (25 mg total) by mouth daily.   ciprofloxacin -dexamethasone  (CIPRODEX ) OTIC suspension 4 drops to right ear twice daily   clobetasol  (TEMOVATE ) 0.05 % external solution Apply 1 application topically 2 (two) times daily.   DULoxetine  (CYMBALTA ) 60 MG capsule Take 1 capsule (60 mg total) by mouth daily.   FARXIGA  5 MG TABS tablet TAKE 1 TABLET BY MOUTH ONCE DAILY BEFORE BREAKFAST   fluticasone  (FLONASE ) 50 MCG/ACT nasal spray Place 2 sprays into both nostrils daily.   furosemide  (LASIX ) 20 MG tablet Take 1 tablet (20 mg total) by mouth daily as needed (for shortness of breath or weight gain of 2 lbs overnight or 5 lbs in a week).   glucose blood (ACCU-CHEK  GUIDE TEST) test strip USE 1 EACH BY IN VITRO ROUTE IN ETH MORNING, AT NOON, AND AT BEDTIME   isosorbide  mononitrate (IMDUR ) 60 MG 24 hr tablet TAKE 1 & 1/2 (ONE & ONE-HALF) TABLETS BY MOUTH ONCE DAILY   levothyroxine (SYNTHROID) 112 MCG tablet Take by mouth.   loratadine  (CLARITIN ) 10 MG tablet Take 1 tablet (10 mg total) by mouth daily.   meclizine  (ANTIVERT ) 12.5 MG tablet Take 1 tablet (12.5 mg total) by mouth 3 (three) times daily as needed for dizziness.   meloxicam  (MOBIC ) 15 MG tablet Take by mouth.   methimazole  (TAPAZOLE ) 5 MG tablet Take 5 mg daily   metoCLOPramide  (REGLAN ) 5 MG tablet TAKE 1 TABLET BY MOUTH THREE TIMES DAILY BEFORE MEALS. SCHEDULE WITH GI FOR FUTHER REFILLS   Multiple Vitamins-Minerals (MULTIVITAMIN WITH MINERALS) tablet Take 1 tablet by mouth daily.   pantoprazole  (PROTONIX ) 40 MG tablet Take 1 tablet (40 mg total) by mouth daily.   potassium chloride  SA (KLOR-CON  M20) 20 MEQ tablet Take 2 tablets (40 mEq total) by mouth daily.   REPATHA  SURECLICK 140 MG/ML SOAJ INJECT 140 MG SUBCUTANEOUSLY EVERY 14 DAYS   rosuvastatin  (CRESTOR ) 40 MG tablet Take 1 tablet by  mouth once daily   SUMAtriptan  (IMITREX ) 50 MG tablet Take 50 mg by mouth daily as needed for migraine.   tretinoin  (RETIN-A ) 0.025 % cream Apply topically at bedtime. For the 1st month apply 2 nights weekly after one month increase to M-W-F nights, cut back usage to 1-2 nights weekly if you experience excessive dryness,   Vitamin D , Ergocalciferol , (DRISDOL ) 1.25 MG (50000 UNIT) CAPS capsule Take 1 capsule (50,000 Units total) by mouth every 7 (seven) days.   XIIDRA 5 % SOLN Apply 1 drop to eye 2 (two) times daily.    Physical Exam:    VS:  BP 128/77   Pulse 86   Ht 5' 4 (1.626 m)   Wt 216 lb (98 kg)   SpO2 95%   BMI 37.08 kg/m    Wt Readings from Last 3 Encounters:  12/23/23 216 lb (98 kg)  12/06/23 219 lb (99.3 kg)  10/25/23 222 lb (100.7 kg)    GEN: Well nourished, well developed in no acute  distress NECK: No JVD; No carotid bruits CARDIAC: RRR, no murmurs, rubs, gallops RESPIRATORY:  Clear to auscultation without rales, wheezing or rhonchi  ABDOMEN: Soft, non-tender, non-distended EXTREMITIES:  No edema; No acute deformity   Asessement and Plan:.    1. Precordial pain, CAD, HLD, dyspnea -  recent workups reassuring. ED visit for prolonged symptoms in 07/2023 had negative troponins arguing against ischemia as a cause for her symptoms. Her recent cardiac PET scan did not show any evidence of coronary insufficiency. I discussed her case with Dr. Jeffrie (DOD) given continued symptoms at this point felt noncardiac in etiology. Recommended f/u with GI as previously requested - will send msg to Jessica Zehr given the phone tag with their office for them to reach out to her again. Continue PPI. I do not feel strongly about increasing her already-excellent anti-anginal regimen as previous med adjustments have not really helped her chest pain, and I do not want to worsen her dizziness. Continue ASA, rosuvastatin , PCSK9i. Will also check BMET, BNP given episodic use of Lasix . Volume status looks stable today. She is otherwise not tachycardic, tachypneic, or hypoxic today. I encouraged ongoing f/u with primary care for noncardiac workup of her general symptoms. She also had had an ER CT in 07/2023 that picked up a thyroid  nodule - encouraged her to discuss w/u with PCP for this.  2. Essential HTN - BP controlled. No additional changes today.  3. Sleep apnea - she has been having trouble with her CPAP machine and goes to [facility] on Wednesday to get it checked out. I have asked MA to reach out to Brad Molt to help reach out to the patient for assistance.  4. Carotid artery disease - predominantly higher up in the carotid siphons. Carotid bulbs had mild atheromatous disease. Continue lipid management and aspirin . Referral to neurology for persistent dizziness pending.    Disposition: F/u with Dr. Okey  in 6 months.  Signed, Arrie Borrelli N Clerance Umland, PA-C

## 2023-12-23 ENCOUNTER — Ambulatory Visit: Attending: Physician Assistant | Admitting: Physician Assistant

## 2023-12-23 ENCOUNTER — Ambulatory Visit: Payer: Self-pay | Admitting: Internal Medicine

## 2023-12-23 ENCOUNTER — Encounter: Payer: Self-pay | Admitting: Physician Assistant

## 2023-12-23 VITALS — BP 128/77 | HR 86 | Ht 64.0 in | Wt 216.0 lb

## 2023-12-23 DIAGNOSIS — I6523 Occlusion and stenosis of bilateral carotid arteries: Secondary | ICD-10-CM

## 2023-12-23 DIAGNOSIS — G4730 Sleep apnea, unspecified: Secondary | ICD-10-CM

## 2023-12-23 DIAGNOSIS — R0602 Shortness of breath: Secondary | ICD-10-CM

## 2023-12-23 DIAGNOSIS — I10 Essential (primary) hypertension: Secondary | ICD-10-CM | POA: Diagnosis not present

## 2023-12-23 DIAGNOSIS — E785 Hyperlipidemia, unspecified: Secondary | ICD-10-CM

## 2023-12-23 DIAGNOSIS — R072 Precordial pain: Secondary | ICD-10-CM

## 2023-12-23 DIAGNOSIS — I2510 Atherosclerotic heart disease of native coronary artery without angina pectoris: Secondary | ICD-10-CM

## 2023-12-23 NOTE — Patient Instructions (Signed)
 Medication Instructions:  NO CHANGES *If you need a refill on your cardiac medications before your next appointment, please call your pharmacy*  Lab Work: BMP AND BNP TODAY If you have labs (blood work) drawn today and your tests are completely normal, you will receive your results only by: MyChart Message (if you have MyChart) OR A paper copy in the mail If you have any lab test that is abnormal or we need to change your treatment, we will call you to review the results.  Testing/Procedures: NO TESTING  Follow-Up: At [facility], you and your health needs are our priority.  As part of our continuing mission to provide you with exceptional heart care, our providers are all part of one team.  This team includes your primary Cardiologist (physician) and Advanced Practice Providers or APPs (Physician Assistants and Nurse Practitioners) who all work together to provide you with the care you need, when you need it.  Your next appointment:   6 month(s)  Provider:   Vina Gull, MD

## 2023-12-24 ENCOUNTER — Ambulatory Visit: Payer: Self-pay | Admitting: Physician Assistant

## 2023-12-24 DIAGNOSIS — H9011 Conductive hearing loss, unilateral, right ear, with unrestricted hearing on the contralateral side: Secondary | ICD-10-CM | POA: Diagnosis not present

## 2023-12-24 LAB — BASIC METABOLIC PANEL WITH GFR
BUN/Creatinine Ratio: 12 (ref 12–28)
BUN: 11 mg/dL (ref 8–27)
CO2: 22 mmol/L (ref 20–29)
Calcium: 9.8 mg/dL (ref 8.7–10.3)
Chloride: 99 mmol/L (ref 96–106)
Creatinine, Ser: 0.94 mg/dL (ref 0.57–1.00)
Glucose: 118 mg/dL — ABNORMAL HIGH (ref 70–99)
Potassium: 3.7 mmol/L (ref 3.5–5.2)
Sodium: 139 mmol/L (ref 134–144)
eGFR: 67 mL/min/{1.73_m2} (ref >59)

## 2023-12-24 LAB — BRAIN NATRIURETIC PEPTIDE: BNP: 6.6 pg/mL (ref 0.0–100.0)

## 2023-12-25 NOTE — Progress Notes (Signed)
 Pt has been made aware of normal result and verbalized understanding.  jw

## 2023-12-26 ENCOUNTER — Telehealth: Payer: Self-pay

## 2023-12-26 NOTE — Telephone Encounter (Signed)
-----   Message from Delon Hendricks Failing sent at 12/26/2023  9:27 AM EDT ----- Regarding: appt I am covering Jess's box- looks like this patient needs called for an appointment.  Please help, Thanks, JLL ----- Message ----- From: Dunn, Dayna N, PA-C Sent: 12/23/2023   3:25 PM EDT To: Jessica D Zehr, PA-C  Hi Jessica, this is a patient that still had esophageal abnormalities seen on her cardiac PET - I had routed to you and your office tried to reach out to her under that result note, but at the time she wasn't returning calls. When she finally did call back it looks like there was a phone note where the GI team wasn't sure who had called her. She is still having episodic atypical chest pain and cardiac PET was normal so wanted to make sure she gets back in with y'all for the overdue f/u. Can you help? She knows to try to answer this time! Thanks so much!  Dayna, PA with cardiology

## 2023-12-26 NOTE — Telephone Encounter (Signed)
 01/20/24 at 950 am appt made to see Dr Leigh  Attempted to reach pt voice mail full will mail letter

## 2023-12-30 ENCOUNTER — Other Ambulatory Visit: Payer: Self-pay

## 2023-12-30 DIAGNOSIS — E0590 Thyrotoxicosis, unspecified without thyrotoxic crisis or storm: Secondary | ICD-10-CM

## 2023-12-31 ENCOUNTER — Encounter: Payer: Self-pay | Admitting: Dermatology

## 2023-12-31 ENCOUNTER — Ambulatory Visit: Payer: 59 | Admitting: Dermatology

## 2023-12-31 VITALS — BP 132/76

## 2023-12-31 DIAGNOSIS — L281 Prurigo nodularis: Secondary | ICD-10-CM

## 2023-12-31 DIAGNOSIS — D492 Neoplasm of unspecified behavior of bone, soft tissue, and skin: Secondary | ICD-10-CM | POA: Diagnosis not present

## 2023-12-31 DIAGNOSIS — B079 Viral wart, unspecified: Secondary | ICD-10-CM | POA: Diagnosis not present

## 2023-12-31 DIAGNOSIS — D485 Neoplasm of uncertain behavior of skin: Secondary | ICD-10-CM

## 2023-12-31 DIAGNOSIS — R238 Other skin changes: Secondary | ICD-10-CM

## 2023-12-31 DIAGNOSIS — M5416 Radiculopathy, lumbar region: Secondary | ICD-10-CM | POA: Diagnosis not present

## 2023-12-31 NOTE — Patient Instructions (Addendum)
 Cryotherapy Aftercare  Wash gently with soap and water everyday.   Apply Vaseline and Band-Aid daily until healed.    Patient Handout: Wound Care for Skin Biopsy Site  Taking Care of Your Skin Biopsy Site  Proper care of the biopsy site is essential for promoting healing and minimizing scarring. This handout provides instructions on how to care for your biopsy site to ensure optimal recovery.  1. Cleaning the Wound:  Clean the biopsy site daily with gentle soap and water. Gently pat the area dry with a clean, soft towel. Avoid harsh scrubbing or rubbing the area, as this can irritate the skin and delay healing.  2. Applying Aquaphor and Bandage:  After cleaning the wound, apply a thin layer of Aquaphor ointment to the biopsy site. Cover the area with a sterile bandage to protect it from dirt, bacteria, and friction. Change the bandage daily or as needed if it becomes soiled or wet.  3. Continued Care for One Week:  Repeat the cleaning, Aquaphor application, and bandaging process daily for one week following the biopsy procedure. Keeping the wound clean and moist during this initial healing period will help prevent infection and promote optimal healing.  4. Massaging Aquaphor into the Area:  ---After one week, discontinue the use of bandages but continue to apply Aquaphor to the biopsy site. ----Gently massage the Aquaphor into the area using circular motions. ---Massaging the skin helps to promote circulation and prevent the formation of scar tissue.   Additional Tips:  Avoid exposing the biopsy site to direct sunlight during the healing process, as this can cause hyperpigmentation or worsen scarring. If you experience any signs of infection, such as increased redness, swelling, warmth, or drainage from the wound, contact your healthcare provider immediately. Follow any additional instructions provided by your healthcare provider for caring for the biopsy site and managing any  discomfort. Conclusion:  Taking proper care of your skin biopsy site is crucial for ensuring optimal healing and minimizing scarring. By following these instructions for cleaning, applying Aquaphor, and massaging the area, you can promote a smooth and successful recovery. If you have any questions or concerns about caring for your biopsy site, don't hesitate to contact your healthcare provider for guidance.    Important Information  Due to recent changes in healthcare laws, you may see results of your pathology and/or laboratory studies on MyChart before the doctors have had a chance to review them. We understand that in some cases there may be results that are confusing or concerning to you. Please understand that not all results are received at the same time and often the doctors may need to interpret multiple results in order to provide you with the best plan of care or course of treatment. Therefore, we ask that you please give [place] 2 business days to thoroughly review all your results before contacting the office for clarification. Should we see a critical lab result, you will be contacted sooner.   If You Need Anything After Your Visit  If you have any questions or concerns for your doctor, please call our main line at [phone] If no one answers, please leave a voicemail as directed and we will return your call as soon as possible. Messages left after 4 pm will be answered the following business day.   You may also send [place] a message via MyChart. We typically respond to MyChart messages within 1-2 business days.  For prescription refills, please ask your pharmacy to contact our office. Our fax  number is [phone].  If you have an urgent issue when the clinic is closed that cannot wait until the next business day, you can page your doctor at the number below.    Please note that while we do our best to be available for urgent issues outside of office hours, we are not available 24/7.    If you have an urgent issue and are unable to reach [place], you may choose to seek medical care at your doctor's office, retail clinic, urgent care center, or emergency room.  If you have a medical emergency, please immediately call 911 or go to the emergency department. In the event of inclement weather, please call our main line at [phone] for an update on the status of any delays or closures.  Dermatology Medication Tips: Please keep the boxes that topical medications come in in order to help keep track of the instructions about where and how to use these. Pharmacies typically print the medication instructions only on the boxes and not directly on the medication tubes.   If your medication is too expensive, please contact our office at [phone] or send [place] a message through MyChart.   We are unable to tell what your co-pay for medications will be in advance as this is different depending on your insurance coverage. However, we may be able to find a substitute medication at lower cost or fill out paperwork to get insurance to cover a needed medication.   If a prior authorization is required to get your medication covered by your insurance company, please allow [place] 1-2 business days to complete this process.  Drug prices often vary depending on where the prescription is filled and some pharmacies may offer cheaper prices.  The website www.goodrx.com contains coupons for medications through different pharmacies. The prices here do not account for what the cost may be with help from insurance (it may be cheaper with your insurance), but the website can give you the price if you did not use any insurance.  - You can print the associated coupon and take it with your prescription to the pharmacy.  - You may also stop by our office during regular business hours and pick up a GoodRx coupon card.  - If you need your prescription sent electronically to a different pharmacy, notify our office  through [facility] or by phone at [phone]

## 2023-12-31 NOTE — Progress Notes (Signed)
 Follow-Up Visit   Subjective  [name] is a 63 y.o. female who presents for the following: HS - Epidermodysplasia Verruciformis (EDV)  Patient present today for follow up visit. Patient was last evaluated on 08/21/23. At this visit HS was well controlled and patient was prescribed Tretinoin  0.025% -using MWF nights for acne and EDV. Also recommended LRP Urea. Patient reports sxs are better. Patient denies medication changes.  The following portions of the chart were reviewed this encounter and updated as appropriate: medications, allergies, medical history  Review of Systems:  No other skin or systemic complaints except as noted in HPI or Assessment and Plan.  Objective  Well appearing patient in no apparent distress; mood and affect are within normal limits.   A focused examination was performed of the following areas: face, hands, legs & feet   Relevant exam findings are noted in the Assessment and Plan.                                   Left Lower Leg - Anterior Verrucous papules Left Hand - Posterior Verrucous papules Left Anterior Neck Firm Brown lichenified nodule   Assessment & Plan     Multiple verrucous papules and plaques (suspected Epidermodysplasia Verruciformis (EDV)) - Assessment:  Patient presents with multiple verrucous papules and plaques present since childhood, affecting lower extremities, feet, legs, knees, thighs, hands, forearms, and neck. Previous treatment with tretinoin  was initiated to flatten the lesions, but efficacy is unclear. The lesions are not reported to be itchy. Differential diagnoses include viral warts and other verrucous skin conditions.   - Plan:    Perform two shave biopsies:     - Right shin     - Left lower leg and left thoracic hand    Continue current treatment:     - Tretinoin  application Monday through Friday    Skincare recommendations:     - Advise liberal use of moisturizer and Vaseline to combat  dryness    Follow-up:     - Await biopsy results (expected in about a week) to guide further treatment    Potential treatment adjustments based on biopsy results:     - If biopsy confirms warts:        Consider initiating cimetidine to boost immune response against the virus        Possibly increase tretinoin  strength    Patient education:     - Provide post-biopsy care instructions  NEOPLASM OF UNCERTAIN BEHAVIOR OF SKIN (2) Left Lower Leg - Anterior Skin / nail biopsy Type of biopsy: tangential   Informed consent: discussed and consent obtained   Timeout: patient name, date of birth, surgical site, and procedure verified   Procedure prep:  Patient was prepped and draped in usual sterile fashion Prep type:  Isopropyl alcohol Anesthesia: the lesion was anesthetized in a standard fashion   Anesthetic:  1% lidocaine  w/ epinephrine  1-100,000 buffered w/ 8.4% NaHCO3 Instrument used: DermaBlade   Hemostasis achieved with: aluminum chloride   Outcome: patient tolerated procedure well   Post-procedure details: sterile dressing applied and wound care instructions given   Dressing type: petrolatum gauze and bandage    Specimen 1 - Surgical pathology Differential Diagnosis: r/o EDV v verruca  Check Margins: Yes Left Hand - Posterior Skin / nail biopsy Type of biopsy: tangential   Informed consent: discussed and consent obtained   Timeout: patient name, date of birth,  surgical site, and procedure verified   Procedure prep:  Patient was prepped and draped in usual sterile fashion Prep type:  Isopropyl alcohol Anesthesia: the lesion was anesthetized in a standard fashion   Anesthetic:  1% lidocaine  w/ epinephrine  1-100,000 buffered w/ 8.4% NaHCO3 Instrument used: DermaBlade   Hemostasis achieved with: aluminum chloride   Outcome: patient tolerated procedure well   Post-procedure details: sterile dressing applied and wound care instructions given   Dressing type: petrolatum gauze and  bandage    Specimen 2 - Surgical pathology Differential Diagnosis: r/o EDV v verruca  Check Margins: Yes PRURIGO NODULARIS Left Anterior Neck Destruction of lesion - Left Anterior Neck Complexity: simple   Destruction method: cryotherapy   Informed consent: discussed and consent obtained   Timeout:  patient name, date of birth, surgical site, and procedure verified Lesion destroyed using liquid nitrogen: Yes   Post-procedure details: wound care instructions given     Return in 4 months (on 05/02/2024) for EDV - HS.    Documentation: I have reviewed the above documentation for accuracy and completeness, and I agree with the above.  I, Shirron Maranda, CMA, am acting as scribe for [employer], DO.   Delon Lenis, DO

## 2024-01-01 DIAGNOSIS — G4733 Obstructive sleep apnea (adult) (pediatric): Secondary | ICD-10-CM | POA: Diagnosis not present

## 2024-01-01 LAB — SURGICAL PATHOLOGY

## 2024-01-01 NOTE — Progress Notes (Signed)
 Office Visit Note  Patient: [name]             Date of Birth: [birth year]           MRN: [id number]             PCP: Daryl Setter, NP Referring: Daryl Setter, NP Visit Date: 01/10/2024   Subjective:  Follow-up  Discussed the use of AI scribe software for clinical note transcription with the patient, who gave verbal consent to proceed.  History of Present Illness   [name] is a 63 y.o. female here for follow up with osteoarthritis and possible inflammatory arthritis.    The patient reports that their joint pain has improved since the dose of Cymbalta  was increased. Recent labs were borderline abnormal. The sedimentation rate is slightly elevated at 34. The ANA test, previously elevated, has been consistently decreasing over time. No increased leg swelling or other side effects from Cymbalta  are noted.  The patient reports that their doctor now thinks the pain may be coming from the hip rather than the back. An MRI has been requested to further investigate the hip, but it has not yet been scheduled. A previous MRI for the back was done a couple of weeks ago.  The patient received a knee injection and reports improvement in pain afterward. They experience a 'catch' in the ankle when walking, but no significant pain was noted. They have been walking more, which sometimes results in a sharp pain in the knee that wakes them up at night. They have not been using topical medications like diclofenac  recently.  They use muscle relaxers, particularly at night, to help with cramping or spasming, but have run out of the medication.  Skin spots were evaluated by another doctor, who performed a biopsy to determine if a higher dosage of medication is needed. The results of the biopsy are still pending.      Previous HPI 10/04/2023 [name] is a 63 year old female with osteoarthritis and possible inflammatory arthritis.   She has been experiencing persistent  aches and pains for several years, with stiffness and pain in multiple areas, including her back. Duloxetine  (Cymbalta ) was previously prescribed but did not significantly improve her symptoms, and she experienced no notable side effects. Physical therapy for her back over the past two months has not been beneficial. Previous injections in her back are currently avoided due to potential interactions with her diabetes medication.   She experiences swelling and soreness in her knees, particularly the right knee, which often wakes her at night. Despite attending physical therapy, she feels it has not been beneficial. A history of elevated inflammatory markers was noted in blood tests a few months ago, but no diagnosis of lupus or other specific autoimmune conditions has been made. She continues to experience pain, particularly on the right side of her body. No recent skin changes, rashes, or upper respiratory infections.   She experiences tingling in her fingers and dizziness, which she attributes to ear issues. She is scheduled to see a surgeon on May 1st for fluid buildup in her ear and has been prescribed medication for dizziness.   Additionally, she reports cramps that extend from her head to her arm intermittently.    04/26/23 [name] is a 63 y.o. female here for follow up with continued joint pain in multiple areas.  Lab testing at initial visit showed positive ANA at 1: 80 titer decreased from 1: 320 previously.  Sedimentation rate was still just slightly above normal.  Other antibodies all negative.  Worst area is in her back.  Gets radiation into the legs with prolonged standing this is worse on the right side.  Walking is better and sitting is better compared to standing still.  She has had similar symptoms in the past says this was associated with lumbar disc disease.  Does not see any spine specialist for years.  She has previously gotten good relief with her chiropractor but because pain  has been more manageable was not following up with them regularly.   03/22/23 [name] is a 63 y.o. female here for evaluation of joint pain with positive ANA and ESR.  Medical history is also significant for thyrotoxicosis on treatment with methimazole , esophageal dysphagia and stricture, gastroparesis, and type 2 diabetes.  Some degree of chronic joint pain most prominent in her low back and hips that is not new but symptoms increased in the past year.  She develops area of numbness on the lateral portion of the thigh that occurs after prolonged standing.  Only occasional pain down the back of the leg but she does state very sore in the low back and this gets worse after prolonged walking or standing.  She has morning stiffness lasting about an hour.  Not associated with any significant new illness or injury.  She does have a previous history of left knee injury that was repaired arthroscopically many years ago and gets some pain with increased use in that area.  Does not take any daily medications for joint pain.  She previously saw orthopedic specialist with back steroid injections that helped for a short duration.  She previously saw physical therapy years ago for back pain but did not feel like the treatment helped. Besides joint problems she does describe chronic eye dryness.  Was not found to have any Graves ophthalmopathy with previous evaluations.  Denies oral or nasal ulcers, Raynaud's syndrome, cervical or axillary lymphadenopathy, or abnormal bruising or bleeding.  She has patchy hyperpigmented spots on the distal arms and legs both sides she described as her birthmark is not new or any recent change. Chronic dysphagia previously seeing Dr. Leigh imaging and esophageal manometry consistent with hypercontractile esophagus. She is currently off the methimazole  due to running out of medication just recently.  From chart review looks like she was recommended to have repeat thyroid   parameters checked in July but these labs were never collected sounds like she was not aware or does not member at this time, has been taking 5 mg once daily refill was requested 9/16.   Labs reviewed ANA 1:80 speckled RF neg ESR 58   Review of Systems  Constitutional:  Negative for fatigue.  HENT:  Negative for mouth sores and mouth dryness.   Eyes:  Negative for dryness.  Respiratory:  Negative for shortness of breath.   Cardiovascular:  Negative for chest pain and palpitations.  Gastrointestinal:  Negative for blood in stool, constipation and diarrhea.  Endocrine: Negative for increased urination.  Genitourinary:  Negative for involuntary urination.  Musculoskeletal:  Positive for joint pain, joint pain, myalgias, morning stiffness, muscle tenderness and myalgias. Negative for gait problem, joint swelling and muscle weakness.  Skin:  Negative for color change, rash, hair loss and sensitivity to sunlight.  Allergic/Immunologic: Positive for susceptible to infections.  Neurological:  Negative for dizziness and headaches.  Hematological:  Negative for swollen glands.  Psychiatric/Behavioral:  Negative for depressed mood and sleep disturbance. The  patient is not nervous/anxious.     PMFS History:  Patient Active Problem List   Diagnosis Date Noted   Unilateral primary osteoarthritis, left knee 10/04/2023   Type 2 diabetes mellitus with hyperglycemia, without long-term current use of insulin  (HCC) 08/16/2023   Dysfunction of both eustachian tubes 08/16/2023   Positive ANA (antinuclear antibody) 03/22/2023   Sedimentation rate elevation 03/22/2023   Bulging of lumbar intervertebral disc 09/24/2022   Arthritis 05/11/2022   Controlled type 2 diabetes mellitus without complication, without long-term current use of insulin  (HCC) 12/01/2021   Coronary artery disease of native heart with stable angina pectoris (HCC) 06/30/2021   Hypercontractile esophagus    Aortic atherosclerosis (HCC)  07/17/2017   Insomnia 01/12/2017   Hyperthyroidism 02/29/2012   Esophageal dysphagia 04/18/2011   Ulnar neuropathy 03/30/2011   Lumbar radiculopathy 10/18/2010   Fibromyalgia 07/20/2009   Hyperlipidemia 05/26/2009   Vitamin D  deficiency 05/23/2009   Polyarthralgia 04/12/2009   Iron  deficiency anemia 10/25/2008   GERD 10/25/2008   LIVER HEMANGIOMA 01/01/2008   ESOPHAGEAL STRICTURE 01/01/2008   Gastroparesis 01/01/2008   Anemia 12/08/2007   Allergic rhinitis 12/08/2007   Migraine headache 08/11/2007   HIATAL HERNIA 08/11/2007   OVARIAN CYST 08/11/2007   Essential hypertension 05/06/2007    Past Medical History:  Diagnosis Date   Allergy    allergic rhinitis   Arthritis    Atypical chest pain    Constipation    Cyst, ovarian    Diabetes (HCC)    Diabetes mellitus, type II (HCC)    Elevated alkaline phosphatase level    Endometriosis    Esophageal stricture    Fatty liver    Gastroparesis    GERD (gastroesophageal reflux disease)    Heart murmur    Hepatic hemangioma    Hyperlipidemia    Hypertension    Hypertrophic condition of skin    acrokeratoelastoidosis- s/p 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 1/08- benign   Iron  deficiency anemia    Migraine headache    Non-compliance    Peptic ulcer disease    Thyroid  disease     Family History  Problem Relation Age of Onset   Hypertension Mother    Rheum arthritis Mother    Hypertension Father    Diabetes Father    Prostate cancer Father    Kidney disease Father    Diabetes Sister    Fibromyalgia Sister    Diabetes Maternal Grandmother    Lung cancer Maternal Grandfather    Arthritis Brother    Migraines Brother    CAD Brother    Fibromyalgia Sister    Migraines Sister    Colon cancer Neg Hx    Thyroid  disease Neg Hx    Colon polyps Neg Hx    Past Surgical History:  Procedure Laterality Date   ABDOMINAL EXPLORATION SURGERY     w/bso    BREAST BIOPSY     CARDIAC CATHETERIZATION  2009   mild non obstructive CAD    CHOLECYSTECTOMY     COLONOSCOPY     ESOPHAGEAL MANOMETRY N/A 08/01/2020   Procedure: ESOPHAGEAL MANOMETRY (EM);  Surgeon: Leigh Elspeth SQUIBB, MD;  Location: [facility];  Service: Gastroenterology;  Laterality: N/A;   KNEE SURGERY  2005    left knee   LEFT HEART CATH AND CORONARY ANGIOGRAPHY N/A 07/17/2017   Procedure: LEFT HEART CATH AND CORONARY ANGIOGRAPHY;  Surgeon: [place], Peter M, MD;  Location: [facility] INVASIVE CV LAB;  Service: Cardiovascular;  Laterality: N/A;   LEFT HEART CATH AND CORONARY ANGIOGRAPHY  N/A 09/01/2021   Procedure: LEFT HEART CATH AND CORONARY ANGIOGRAPHY;  Surgeon: Verlin Lonni BIRCH, MD;  Location: [facility] INVASIVE CV LAB;  Service: Cardiovascular;  Laterality: N/A;   POLYPECTOMY     TOTAL ABDOMINAL HYSTERECTOMY     UPPER GASTROINTESTINAL ENDOSCOPY     Social History   Social History Narrative   Widowed   Has 2 grown children.  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    Former Smoker Quit tobacco in 1996.  She was a pack a day smoker for approximately 10 years.    Alcohol use-yes: Social    Daily Caffeine Use:6 pack of pepsi daily     Illicit Drug Use - no    Patient does not get regular exercise.       Smoking Status:  quit      Right handed   Living alone, 5 steps to up entrance of the house   Retired    Immunization History  Administered Date(s) Administered   Fluad Quad(high Dose 65+) 03/31/2019   Hep A / Hep B 12/07/2023   Influenza Split 04/30/2011   Influenza Whole 04/19/2006, 04/17/2007, 03/08/2009, 03/01/2010   Influenza, Quadrivalent, Recombinant, Inj, Pf 03/31/2019   Influenza, Seasonal, Injecte, Preservative Fre 05/30/2012   Influenza,inj,Quad PF,6+ Mos 02/15/2014, 03/08/2015, 04/30/2016, 04/24/2017, 04/04/2018, 06/10/2020   Influenza-Unspecified 02/23/2021, 03/10/2023   PFIZER(Purple Top)SARS-COV-2 Vaccination 09/07/2019, 09/29/2019, 05/20/2020   PNEUMOCOCCAL CONJUGATE-20 03/19/2023   Pfizer Covid-19  Vaccine Bivalent Booster 70yrs & up 02/23/2021   Pneumococcal Conjugate Pcv21, Polysaccharide Crm197 Conjugaf 12/07/2023   Pneumococcal Conjugate-13 04/04/2018   Pneumococcal Polysaccharide-23 03/08/2009, 09/23/2020   Td 05/17/1999   Tdap 04/30/2011, 11/15/2022   Zoster Recombinant(Shingrix) 03/31/2019, 08/02/2019     Objective: Vital Signs: BP 122/74 (BP Location: Left Arm, Patient Position: Sitting, Cuff Size: Large)   Pulse 64   Ht 5' 4 (1.626 m)   Wt 223 lb (101.2 kg)   BMI 38.28 kg/m    Physical Exam Eyes:     Conjunctiva/sclera: Conjunctivae normal.  Cardiovascular:     Rate and Rhythm: Normal rate and regular rhythm.  Pulmonary:     Effort: Pulmonary effort is normal.     Breath sounds: Normal breath sounds.  Lymphadenopathy:     Cervical: No cervical adenopathy.  Skin:    General: Skin is warm and dry.     Comments: Firm slightly raised 2-3 cm flesh-colored or slightly hypopigmented nodules throughout especially distal extremities   Neurological:     Mental Status: She is alert.  Psychiatric:        Mood and Affect: Mood normal.      Musculoskeletal Exam:  Shoulders full ROM no tenderness or swelling Elbows full ROM no tenderness or swelling Wrists full ROM no tenderness or swelling Fingers full ROM no tenderness or swelling Low back midline and paraspinal tenderness much more right than left Mild right lateral hip tenderness to pressure Left knee anterior joint line tenderness to pressure, no palpable effusion    Investigation: No additional findings.  Imaging: No results found.  Recent Labs: Lab Results  Component Value Date   WBC 8.6 12/06/2023   HGB 12.1 12/06/2023   PLT 252 12/06/2023   NA 139 12/23/2023   K 3.7 12/23/2023   CL 99 12/23/2023   CO2 22 12/23/2023   GLUCOSE 118 (H) 12/23/2023   BUN 11 12/23/2023   CREATININE 0.94 12/23/2023   BILITOT 0.2 04/16/2023   ALKPHOS 99 04/16/2023  AST 16 04/16/2023   ALT 20 04/16/2023   PROT  6.8 04/16/2023   ALBUMIN 4.0 04/16/2023   CALCIUM  9.8 12/23/2023   GFRAA >60 04/06/2019    Speciality Comments: No specialty comments available.  Procedures:  No procedures performed Allergies: Metformin , Ace inhibitors, Celebrex [celecoxib], and Diovan  [valsartan ]   Assessment / Plan:     Visit Diagnoses: Positive ANA (antinuclear antibody) - cymbalta  to 60 mg daily No specific clinical criteria or peripheral synovitis appreciated, but with significant elevated inflammatory markers on labs. Continuing symptomatic management.  Unilateral primary osteoarthritis, left knee - S/P Large Joint Inj: L knee on 10/04/2023 Knee pain improved post-corticosteroid injection. Ongoing pain described as sharp and associated with a catching sensation when walking. Some swelling noted in the ankle. - Consider repeat knee injection if pain or swelling increases, recommend at least a three-month interval between injections  Polyarthralgia - Plan: DULoxetine  (CYMBALTA ) 60 MG capsule Joint pain improved with increased Cymbalta . Slightly elevated inflammatory markers, decreasing ANA levels. - Continue Cymbalta  at current dose.  Pain in right hip - Plan: methocarbamol  (ROBAXIN ) 500 MG tablet Hip pain likely source of discomfort, localized to gluteal region and down leg, possibly involving IT band. Awaiting MRI. - Await scheduling and results of hip MRI. - Provide IT band rehabilitation exercises.  Muscle spasms Muscle relaxers beneficial for nighttime cramping or spasming. - Prescribe muscle relaxers for nighttime use.        Orders: No orders of the defined types were placed in this encounter.  Meds ordered this encounter  Medications   DULoxetine  (CYMBALTA ) 60 MG capsule    Sig: Take 1 capsule (60 mg total) by mouth daily.    Dispense:  90 capsule    Refill:  1   methocarbamol  (ROBAXIN ) 500 MG tablet    Sig: Take 1 tablet (500 mg total) by mouth at bedtime as needed for muscle spasms.     Dispense:  30 tablet    Refill:  2     Follow-Up Instructions: Return in about 6 months (around 07/12/2024) for OA/bursitis on CYM f/u 6mos.   Lonni LELON Ester, MD  Note - This record has been created using [employer].  Chart creation errors have been sought, but may not always  have been located. Such creation errors do not reflect on  the standard of medical care.

## 2024-01-02 DIAGNOSIS — G8929 Other chronic pain: Secondary | ICD-10-CM | POA: Diagnosis not present

## 2024-01-02 DIAGNOSIS — M25551 Pain in right hip: Secondary | ICD-10-CM | POA: Diagnosis not present

## 2024-01-10 ENCOUNTER — Ambulatory Visit: Attending: Internal Medicine | Admitting: Internal Medicine

## 2024-01-10 ENCOUNTER — Encounter: Payer: Self-pay | Admitting: Family

## 2024-01-10 ENCOUNTER — Encounter: Payer: Self-pay | Admitting: Internal Medicine

## 2024-01-10 VITALS — BP 122/74 | HR 64 | Ht 64.0 in | Wt 223.0 lb

## 2024-01-10 DIAGNOSIS — M25551 Pain in right hip: Secondary | ICD-10-CM

## 2024-01-10 DIAGNOSIS — M2550 Pain in unspecified joint: Secondary | ICD-10-CM | POA: Diagnosis not present

## 2024-01-10 DIAGNOSIS — R768 Other specified abnormal immunological findings in serum: Secondary | ICD-10-CM

## 2024-01-10 DIAGNOSIS — M1712 Unilateral primary osteoarthritis, left knee: Secondary | ICD-10-CM | POA: Diagnosis not present

## 2024-01-10 MED ORDER — METHOCARBAMOL 500 MG PO TABS: 500.0000 mg | ORAL_TABLET | Freq: Every evening | ORAL | 2 refills | Status: DC | PRN

## 2024-01-10 MED ORDER — DULOXETINE HCL 60 MG PO CPEP: 60.0000 mg | ORAL_CAPSULE | Freq: Every day | ORAL | 1 refills | Status: DC

## 2024-01-10 NOTE — Patient Instructions (Signed)
 Quadriceps stretch, prone  Lie face down (prone) on a firm surface like a bed or padded floor. Bend your left / right knee. Reach back to hold your ankle or pant leg. If you can't reach your ankle or pant leg, use a belt looped around your foot and grab the belt instead. Gently pull your heel toward your butt. Your knee should not slide out to the side. You should feel a stretch in the front of your thigh and knee, also called the quadriceps. Hold this position for __________ seconds. Repeat __________ times. Complete this exercise __________ times a day. Iliotibial band stretch The iliotibial band is a strip of tissue that runs along the outside of your hip down to your knee. Lie on your side with your left / right leg on top. Bend both knees and grab your left / right ankle. Stretch out your bottom arm to help you balance. Slowly bring your top knee back so your thigh goes behind your back. Slowly lower your top leg toward the floor until you feel a gentle stretch on the outside of your left / right hip and thigh. If you don't feel a stretch and your knee won't go farther, place the heel of your other foot on top of your knee and pull your knee down toward the floor with your foot. Hold this position for __________ seconds. Repeat __________ times. Complete this exercise __________ times a day. Strengthening exercises These exercises build strength and endurance in your hip and pelvis. Endurance means your muscles can keep working even when they're tired. Straight leg raises, side-lying This exercise strengthens the muscles that rotate the leg at the hip and move it away from your body. These muscles are called hip abductors. Lie on your side with your left / right leg on top. Lie so your head, shoulder, hip, and knee line up. You can bend your bottom knee to help you balance. Roll your hips slightly forward so they're stacked directly over each other. Your left / right knee should face  forward. Tense the muscles in your outer thigh and hip. Lift your top leg 4-6 inches (10-15 cm) off the ground. Hold this position for __________ seconds. Slowly lower your leg back down to the starting position. Let your muscles fully relax before doing this exercise again. Repeat __________ times. Complete this exercise __________ times a day. Leg raises, prone This exercise strengthens the muscles that move the hips backward. These muscles are called hip extensors. Lie face down (prone) on your bed or a firm surface. You can put a pillow under your hips for comfort and to support your lower back. Bend your left / right knee so your foot points straight up toward the ceiling. Keep the other leg straight and behind you. Squeeze your butt muscles. Lift your left / right thigh off the firm surface. Do not let your back arch. Tense your thigh muscle as hard as you can without having more knee pain. Hold this position for __________ seconds. Slowly lower your leg to the starting position. Allow your leg to relax all the way. Repeat __________ times. Complete this exercise __________ times a day. Hip hike  Stand sideways on a bottom step. Place your feet so that your left / right leg is on the step, and the other foot is hanging off the side. If you need support for balance, hold onto a railing or wall. Keep your knees straight and your abdomen square, meaning your hips are level.  Then, lift your left / right hip up toward the ceiling. Slowly let your leg that's hanging off the step lower towards the floor. Your foot should get closer to the ground. Do not lean or bend your knees during this movement. Repeat __________ times. Complete this exercise __________ times a day.

## 2024-01-11 ENCOUNTER — Encounter: Payer: Self-pay | Admitting: Family

## 2024-01-14 DIAGNOSIS — H6991 Unspecified Eustachian tube disorder, right ear: Secondary | ICD-10-CM | POA: Diagnosis not present

## 2024-01-17 ENCOUNTER — Encounter: Payer: Self-pay | Admitting: Neurology

## 2024-01-17 ENCOUNTER — Ambulatory Visit (INDEPENDENT_AMBULATORY_CARE_PROVIDER_SITE_OTHER): Admitting: Neurology

## 2024-01-17 VITALS — BP 132/78 | HR 68 | Ht 64.0 in | Wt 220.0 lb

## 2024-01-17 DIAGNOSIS — R42 Dizziness and giddiness: Secondary | ICD-10-CM | POA: Diagnosis not present

## 2024-01-17 NOTE — Patient Instructions (Addendum)
 Good to meet you.  Schedule MRI brain with and without contrast with cuts through IACs  2. Continue working on improving blood pressure, cholesterol, glucose levels  3. Our office will call with results and next steps

## 2024-01-17 NOTE — Progress Notes (Signed)
 NEUROLOGY CONSULTATION NOTE  [name] MRN: [id number] DOB: 1960-09-17  Referring provider: Dayna Dunn, PA-C Primary care provider: Eleanor Ponto, NP  Reason for consult:  dizziness  Thank you for your kind referral of [name] for consultation of the above symptoms. Although her history is well known to you, please allow me to reiterate it for the purpose of our medical record. She is alone in the office today.  Records and images were personally reviewed where available.   HISTORY OF PRESENT ILLNESS: This is a pleasant 63 year old right-handed woman with a history of hypertension, hyperlipidemia, DM2, CAD, migraines, epidermodysplasia verruciformis, presenting for evaluation of dizziness. She reports symptoms started after an ear infection in January 2025. She started having bilateral ear pain and was evaluated on 07/18/23, started on antibiotics. She presented to the ER on 08/03/23 after waking up with headache and dizziness described as a feeling she would pass out and a spinning sensation, worse with head movement. She had a CTA head and neck with and without contrast which did not show any large vessel occlusion, there was atheromatous change around the carotid siphons bilaterally with associated moderate to severe stenosis bilaterally. She continued to see ENT and had a tube placed, told last week that the infection had cleared up. However there has been no change in the dizziness. Today she denies any spinning sensation. She reports feeling lightheaded, she went a few months without any symptoms but this week dizziness has been occurring daily lasting a few minutes then improving once she sits down. Yesterday she was walking then all of a sudden felt a little woozy, she sat down and symptoms resolved. No dizziness when sitting and supine. She checks her BP and does not notice much difference when not dizzy, BP can be up to 170/80. Today is was 153/75. Meclizine  does help,  she uses it every now and then. She continues to drive, it does not affect driving.  She denies any diplopia, dysarthria/dysphagia, focal numbness/tingling/weakness. She has right-sided neck pain radiating to the right shoulder. She has chronic back pain and right hip pain. She has nausea from gastroparesis, she was driving and had to pull over due to nausea then threw up. There was no dizziness or headache at that time. She had migraines when younger with pain in the frontal regions, she takes prn Imitrex . Last migraine was a year ago. Her grandmother used to have migraines and dizziness. She lives alone. No falls or head injuries.    Lab Results  Component Value Date   CHOL 137 08/09/2023   HDL 41.80 08/09/2023   LDLCALC 67 08/09/2023   LDLDIRECT 76 07/21/2019   TRIG 144.0 08/09/2023   CHOLHDL 3 08/09/2023    PAST MEDICAL HISTORY: Past Medical History:  Diagnosis Date   Allergy    allergic rhinitis   Arthritis    Atypical chest pain    Constipation    Cyst, ovarian    Diabetes (HCC)    Diabetes mellitus, type II (HCC)    Elevated alkaline phosphatase level    Endometriosis    Esophageal stricture    Fatty liver    Gastroparesis    GERD (gastroesophageal reflux disease)    Heart murmur    Hepatic hemangioma    Hyperlipidemia    Hypertension    Hypertrophic condition of skin    acrokeratoelastoidosis- s/p 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 1/08- benign   Iron  deficiency anemia    Migraine headache    Non-compliance  Peptic ulcer disease    Thyroid  disease     PAST SURGICAL HISTORY: Past Surgical History:  Procedure Laterality Date   ABDOMINAL EXPLORATION SURGERY     w/bso    BREAST BIOPSY     CARDIAC CATHETERIZATION  2009   mild non obstructive CAD   CHOLECYSTECTOMY     COLONOSCOPY     ESOPHAGEAL MANOMETRY N/A 08/01/2020   Procedure: ESOPHAGEAL MANOMETRY (EM);  Surgeon: Leigh Elspeth SQUIBB, MD;  Location: [facility];  Service: Gastroenterology;  Laterality: N/A;   KNEE  SURGERY  2005    left knee   LEFT HEART CATH AND CORONARY ANGIOGRAPHY N/A 07/17/2017   Procedure: LEFT HEART CATH AND CORONARY ANGIOGRAPHY;  Surgeon: [place], Peter M, MD;  Location: [facility] INVASIVE CV LAB;  Service: Cardiovascular;  Laterality: N/A;   LEFT HEART CATH AND CORONARY ANGIOGRAPHY N/A 09/01/2021   Procedure: LEFT HEART CATH AND CORONARY ANGIOGRAPHY;  Surgeon: Verlin Lonni BIRCH, MD;  Location: [facility] INVASIVE CV LAB;  Service: Cardiovascular;  Laterality: N/A;   POLYPECTOMY     TOTAL ABDOMINAL HYSTERECTOMY     UPPER GASTROINTESTINAL ENDOSCOPY      MEDICATIONS: Current Outpatient Medications on File Prior to Visit  Medication Sig Dispense Refill   Accu-Chek Softclix Lancets lancets 3 (three) times daily.     albuterol  (VENTOLIN  HFA) 108 (90 Base) MCG/ACT inhaler INHALE 2 PUFFS BY MOUTH 4 TIMES DAILY 9 g 0   alclomethasone (ACLOVATE ) 0.05 % cream Apply topically 2 (two) times daily as needed (Rash). 180 g 3   amLODipine  (NORVASC ) 10 MG tablet Take 1 tablet (10 mg total) by mouth daily. 90 tablet 0   aspirin  81 MG chewable tablet Chew 1 tablet (81 mg total) by mouth daily.     Blood Glucose Monitoring Suppl DEVI 1 each by Does not apply route in the morning, at noon, and at bedtime. May substitute to any manufacturer covered by patient's insurance. 1 each 0   clobetasol  (TEMOVATE ) 0.05 % external solution Apply 1 application topically 2 (two) times daily. 50 mL 6   DULoxetine  (CYMBALTA ) 60 MG capsule Take 1 capsule (60 mg total) by mouth daily. 90 capsule 1   FARXIGA  5 MG TABS tablet TAKE 1 TABLET BY MOUTH ONCE DAILY BEFORE BREAKFAST 30 tablet 0   fluticasone  (FLONASE ) 50 MCG/ACT nasal spray Place 2 sprays into both nostrils daily. 16 g 1   furosemide  (LASIX ) 20 MG tablet Take 1 tablet (20 mg total) by mouth daily as needed (for shortness of breath or weight gain of 2 lbs overnight or 5 lbs in a week). 45 tablet 3   glucose blood (ACCU-CHEK GUIDE TEST) test strip USE 1 EACH BY IN VITRO  ROUTE IN ETH MORNING, AT NOON, AND AT BEDTIME 100 each 2   isosorbide  mononitrate (IMDUR ) 60 MG 24 hr tablet TAKE 1 & 1/2 (ONE & ONE-HALF) TABLETS BY MOUTH ONCE DAILY 135 tablet 3   levothyroxine (SYNTHROID) 112 MCG tablet Take by mouth.     loratadine  (CLARITIN ) 10 MG tablet Take 1 tablet (10 mg total) by mouth daily. 10 tablet 0   meclizine  (ANTIVERT ) 12.5 MG tablet Take 1 tablet (12.5 mg total) by mouth 3 (three) times daily as needed for dizziness. 30 tablet 0   meloxicam  (MOBIC ) 15 MG tablet Take by mouth.     methimazole  (TAPAZOLE ) 5 MG tablet Take 5 mg daily 30 tablet 5   methocarbamol  (ROBAXIN ) 500 MG tablet Take 1 tablet (500 mg total) by mouth at bedtime  as needed for muscle spasms. 30 tablet 2   pantoprazole  (PROTONIX ) 40 MG tablet Take 1 tablet (40 mg total) by mouth daily. 30 tablet 5   potassium chloride  SA (KLOR-CON  M20) 20 MEQ tablet Take 2 tablets (40 mEq total) by mouth daily. 180 tablet 3   REPATHA  SURECLICK 140 MG/ML SOAJ INJECT 140 MG SUBCUTANEOUSLY EVERY 14 DAYS 6 mL 0   rosuvastatin  (CRESTOR ) 40 MG tablet Take 1 tablet by mouth once daily 90 tablet 0   tretinoin  (RETIN-A ) 0.025 % cream Apply topically at bedtime. For the 1st month apply 2 nights weekly after one month increase to M-W-F nights, cut back usage to 1-2 nights weekly if you experience excessive dryness, 45 g 2   Vitamin D , Ergocalciferol , (DRISDOL ) 1.25 MG (50000 UNIT) CAPS capsule Take 1 capsule (50,000 Units total) by mouth every 7 (seven) days. 12 capsule 0   XIIDRA 5 % SOLN Apply 1 drop to eye 2 (two) times daily.     chlorthalidone  (HYGROTON ) 25 MG tablet Take 1 tablet (25 mg total) by mouth daily. 90 tablet 3   ciprofloxacin -dexamethasone  (CIPRODEX ) OTIC suspension 4 drops to right ear twice daily     metoCLOPramide  (REGLAN ) 5 MG tablet TAKE 1 TABLET BY MOUTH THREE TIMES DAILY BEFORE MEALS. SCHEDULE WITH GI FOR FUTHER REFILLS 90 tablet 0   Multiple Vitamins-Minerals (MULTIVITAMIN WITH MINERALS) tablet Take 1  tablet by mouth daily. (Patient not taking: Reported on 01/17/2024)     SUMAtriptan  (IMITREX ) 50 MG tablet Take 50 mg by mouth daily as needed for migraine.     No current facility-administered medications on file prior to visit.    ALLERGIES: Allergies  Allergen Reactions   Metformin  Anxiety    Vomiting, Anxiety, Cramping, Diarrhea   Ace Inhibitors Cough   Celebrex [Celecoxib] Other (See Comments)    makes me bleed   Diovan  [Valsartan ]     angioedema    FAMILY HISTORY: Family History  Problem Relation Age of Onset   Hypertension Mother    Rheum arthritis Mother    Hypertension Father    Diabetes Father    Prostate cancer Father    Kidney disease Father    Diabetes Sister    Fibromyalgia Sister    Diabetes Maternal Grandmother    Lung cancer Maternal Grandfather    Arthritis Brother    Migraines Brother    CAD Brother    Fibromyalgia Sister    Migraines Sister    Colon cancer Neg Hx    Thyroid  disease Neg Hx    Colon polyps Neg Hx     SOCIAL HISTORY: Social History   Socioeconomic History   Marital status: Widowed    Spouse name: Not on file   Number of children: 2   Years of education: Not on file   Highest education level: Not on file  Occupational History   Occupation: DISABILITY    Employer: UNEMPLOYED  Tobacco Use   Smoking status: Former    Current packs/day: 0.00    Average packs/day: 0.5 packs/day for 18.0 years (9.0 ttl pk-yrs)    Types: Cigarettes    Start date: 07/29/1977    Quit date: 06/25/1994    Years since quitting: 29.5   Smokeless tobacco: Never   Tobacco comments:    quit 19 years ago  Vaping Use   Vaping status: Never Used  Substance and Sexual Activity   Alcohol use: Yes    Alcohol/week: 0.0 standard drinks of alcohol    Comment: social  drinker   Drug use: No   Sexual activity: Not Currently  Other Topics Concern   Not on file  Social History Narrative   Widowed   Has 2 grown children.  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    Former Smoker Quit tobacco in 1996.  She was a pack a day smoker for approximately 10 years.    Alcohol use-yes: Social    Daily Caffeine Use:6 pack of pepsi daily     Illicit Drug Use - no    Patient does not get regular exercise.       Smoking Status:  quit      Right handed   Living alone, 5 steps to up entrance of the house   Retired    Social Drivers of [employer] Strain: Low Risk  (01/16/2023)   Overall Financial Resource Strain (CARDIA)    Difficulty of Paying Living Expenses: Not hard at all  Food Insecurity: No Food Insecurity (01/16/2023)   Hunger Vital Sign    Worried About Running Out of Food in the Last Year: Never true    Ran Out of Food in the Last Year: Never true  Transportation Needs: No Transportation Needs (01/16/2023)   PRAPARE - Administrator, Civil Service (Medical): No    Lack of Transportation (Non-Medical): No  Physical Activity: Inactive (06/10/2023)   Exercise Vital Sign    Days of Exercise per Week: 0 days    Minutes of Exercise per Session: 0 min  Stress: No Stress Concern Present (01/16/2023)   [employer] of Occupational Health - Occupational Stress Questionnaire    Feeling of Stress : Not at all  Social Connections: Moderately Isolated (06/10/2023)   Social Connection and Isolation Panel    Frequency of Communication with Friends and Family: More than three times a week    Frequency of Social Gatherings with Friends and Family: More than three times a week    Attends Religious Services: More than 4 times per year    Active Member of [employer] or Organizations: No    Attends Banker Meetings: Never    Marital Status: Widowed  Intimate Partner Violence: Not At Risk (06/08/2022)   Humiliation, Afraid, Rape, and Kick questionnaire    Fear of Current or Ex-Partner: No    Emotionally Abused: No    Physically Abused: No    Sexually Abused: No      PHYSICAL EXAM: Vitals:   01/17/24 0915 01/17/24 0939  BP: (!) 153/75 132/78  Pulse: 68   SpO2: 98%    General: No acute distress Head:  Normocephalic/atraumatic Skin/Extremities: Skin changes due to epidermodysplasia verruciformis Neurological Exam: Mental status: alert and awake, no dysarthria or aphasia, Fund of knowledge is appropriate.  Attention and concentration are normal.    Cranial nerves: CN I: not tested CN II: pupils equal, round, visual fields intact CN III, IV, VI:  full range of motion, no nystagmus, no ptosis CN V: facial sensation intact CN VII: upper and lower face symmetric CN VIII: hearing intact to conversation CN XI: sternocleidomastoid and trapezius muscles intact CN XII: tongue midline Bulk & Tone: normal, no fasciculations. Motor: 5/5 throughout with no pronator drift. Sensation: intact to light touch, cold, pin, vibration sense.  No extinction to double simultaneous stimulation.  Romberg test negative Deep Tendon Reflexes: +1 throughout Cerebellar: no incoordination on finger to nose testing Gait: narrow-based and steady, able to tandem  walk adequately. Tremor: none   IMPRESSION: This is a pleasant 63 year old right-handed woman with a history of hypertension, hyperlipidemia, DM2, CAD, migraines, epidermodysplasia verruciformis, presenting for evaluation of dizziness. Initially she reported spinning and lightheadedness, worse with head movements. Currently she denies any spinning sensation, she feels lightheaded with symptoms only occurring when standing. Her neurological exam is normal. Etiology unclear but symptoms suggestive of positional/orthostatic dizziness. MRI brain with and without contrast with cuts through the IACs will be ordered to assess for underlying structural abnormality. We discussed continued management of blood pressure, cholesterol, glucose levels. Our office will call with results and next steps depending on MRI results.     Thank you for allowing me to participate in the care of this patient. Please do not hesitate to call for any questions or concerns.   Darice Shivers, M.D.  CC: Eleanor Ponto, NP, Dayna Dunn, PA-C

## 2024-01-19 ENCOUNTER — Other Ambulatory Visit: Payer: Self-pay | Admitting: Family

## 2024-01-19 ENCOUNTER — Other Ambulatory Visit: Payer: Self-pay | Admitting: Internal Medicine

## 2024-01-19 DIAGNOSIS — E1165 Type 2 diabetes mellitus with hyperglycemia: Secondary | ICD-10-CM

## 2024-01-19 DIAGNOSIS — I2510 Atherosclerotic heart disease of native coronary artery without angina pectoris: Secondary | ICD-10-CM

## 2024-01-19 DIAGNOSIS — E785 Hyperlipidemia, unspecified: Secondary | ICD-10-CM

## 2024-01-20 ENCOUNTER — Ambulatory Visit (INDEPENDENT_AMBULATORY_CARE_PROVIDER_SITE_OTHER): Admitting: Gastroenterology

## 2024-01-20 ENCOUNTER — Encounter: Payer: Self-pay | Admitting: Gastroenterology

## 2024-01-20 VITALS — BP 122/70 | HR 58 | Ht 64.0 in | Wt 223.0 lb

## 2024-01-20 DIAGNOSIS — R1310 Dysphagia, unspecified: Secondary | ICD-10-CM

## 2024-01-20 DIAGNOSIS — K760 Fatty (change of) liver, not elsewhere classified: Secondary | ICD-10-CM

## 2024-01-20 DIAGNOSIS — R933 Abnormal findings on diagnostic imaging of other parts of digestive tract: Secondary | ICD-10-CM | POA: Diagnosis not present

## 2024-01-20 DIAGNOSIS — K219 Gastro-esophageal reflux disease without esophagitis: Secondary | ICD-10-CM

## 2024-01-20 DIAGNOSIS — R110 Nausea: Secondary | ICD-10-CM | POA: Diagnosis not present

## 2024-01-20 DIAGNOSIS — D649 Anemia, unspecified: Secondary | ICD-10-CM | POA: Diagnosis not present

## 2024-01-20 MED ORDER — ONDANSETRON 4 MG PO TBDP: 4.0000 mg | ORAL_TABLET | Freq: Three times a day (TID) | ORAL | 1 refills | Status: DC | PRN

## 2024-01-20 MED ORDER — PANTOPRAZOLE SODIUM 40 MG PO TBEC: 40.0000 mg | DELAYED_RELEASE_TABLET | Freq: Two times a day (BID) | ORAL | Status: DC

## 2024-01-20 NOTE — Patient Instructions (Addendum)
 You have been scheduled for an endoscopy. Please follow written instructions given to you at your visit today.  If you use inhalers (even only as needed), please bring them with you on the day of your procedure.  If you take any of the following medications, they will need to be adjusted prior to your procedure:   DO NOT TAKE 7 DAYS PRIOR TO TEST- Trulicity (dulaglutide) Ozempic, Wegovy (semaglutide) Mounjaro (tirzepatide) Bydureon Bcise (exanatide extended release)  DO NOT TAKE 1 DAY PRIOR TO YOUR TEST Rybelsus (semaglutide) Adlyxin (lixisenatide) Victoza (liraglutide) Byetta (exanatide) ___________________________________________________________________________  Increase Protonix  to 40 mg twice daily for now.  We have sent the following medications to your pharmacy for you to pick up at your convenience: Zofran  4 mg ODT: dissolve orally every 8 hours as needed for nausea  Hold Reglan  for now.  ______________________________________________________  If your blood pressure at your visit was 140/90 or greater, please contact your primary care physician to follow up on this.  _______________________________________________________  If you are age 45 or older, your body mass index should be between 23-30. Your Body mass index is 38.28 kg/m. If this is out of the aforementioned range listed, please consider follow up with your Primary Care Provider.  If you are age 40 or younger, your body mass index should be between 19-25. Your Body mass index is 38.28 kg/m. If this is out of the aformentioned range listed, please consider follow up with your Primary Care Provider.   ________________________________________________________  The [facility] providers would like to encourage you to use MYCHART to communicate with providers for non-urgent requests or questions.  Due to long hold times on the telephone, sending your provider a message by [facility] may be a faster and more efficient way  to get a response.  Please allow 48 business hours for a response.  Please remember that this is for non-urgent requests.  _______________________________________________________  [facility] is using a team-based approach to care.  Your team is made up of your doctor and two to three APPS. Our APPS (Nurse Practitioners and Physician Assistants) work with your physician to ensure care continuity for you. They are fully qualified to address your health concerns and develop a treatment plan. They communicate directly with your gastroenterologist to care for you. Seeing the Advanced Practice Practitioners on your physician's team can help you by facilitating care more promptly, often allowing for earlier appointments, access to diagnostic testing, procedures, and other specialty referrals.

## 2024-01-20 NOTE — Progress Notes (Signed)
 HPI :  63 year old female here for a follow-up visit for history of GERD, dysphagia, gastroparesis, chronic nausea.  Recall she has had a pretty extensive evaluation for some of the symptoms in the past.  She has had esophageal manometry suggesting jackhammer esophagus in 2022.  Recall she has failed a few modalities for treatment in the past to include peppermint, imipramine , has been on Imdur .  Last EGD with me in October 2023.  No focal abnormality noted in her esophagus but empiric dilation performed and she had dilation effect just inferior to the UES at site of suspected subtle stricture.  She did find some benefit with the last dilation.  She states over time her dysphagia has come back.  She has dysphagia to both solids and liquids that can bother her periodically.  She had a cardiac PET scan done this past May which showed her heart seem to be functioning normally but incidentally they noted some distal esophageal thickening.  This was also noted on a cardiac CT back in March 2024 as well.  In fact she has had this noted remotely on multiple imaging studies.  Her EGD in 2023 did not show any findings in that area to account for this.  She states her reflux has benefited from taking Protonix  40 mg daily but she still has some breakthrough a few days a week that bothers her despite taking this.  She has nausea every day which bothers her.  She remotely has been diagnosed with gastroparesis.  She takes Reglan  5 mg once daily, has done so for a while and does not think it helps too much.  She did have benefit from Zofran  when taking this previously but she ran out of it.  She thinks Zofran  has helped more so than the Reglan .  She does have some early satiety at times.  Recall she also has a history of chronic anemia.  She intermittently has had low iron  levels but her ferritin has been markedly elevated, she has seen rheumatology for positive ANA and arthritis.  Her most recent panel shows changes  consistent with anemia of chronic disease.  She has had a chronic microcytic anemia in the setting of normal iron  studies in the past.  Her last colonoscopy was 2019 and normal.  She denies any blood in her stools or problems with her bowels currently.  She also has had fatty liver noted on imaging.  She does have a history of diabetes.  She has been working on weight loss and lost 7 pounds recently.  Her ALT/AST and platelet count are normal.  We discussed this for a bit.     EGD 03/2022: - Esophagogastric landmarks identified. - 1 cm hiatal hernia. - Benign appearing blebs found in the lower esophagus. - Dilation of the esophagus performed to 17mm with Savary, mucosal wrent appreciated just inferior to the UES - Biopsies of the esophagus obtained - Normal stomach. - Mucosal changes in the duodenum as outlined. Biopsied.   1. Surgical [P], 2nd portion of duodenum REACTIVE DUODENAL MUCOSA WITH PROMINENT BRUNNER'S GLANDS NEGATIVE FOR INTRAEPITHELIAL LYMPHOCYTOSIS 2. Surgical [P], mid esophagus, distal esophagus and random sites REACTIVE SQUAMOUS MUCOSA NEGATIVE FOR GLANDULAR EPITHELIUM, EOSINOPHILS, DYSPLASIA AND CARCINOMA   Has history of jackhammer esophagus as seen on esophageal manometry in February 2022.  Cardiac PET 11/20/23:   The study is normal. The study is low risk.   LV perfusion is normal. There is no evidence of ischemia. There is no evidence of infarction.  Rest left ventricular function is normal. Rest EF: 59%. Stress left ventricular function is normal. Stress EF: 68%. End diastolic cavity size is normal. End systolic cavity size is normal.   Myocardial blood flow was computed to be 0.69ml/g/min at rest and 2.15ml/g/min at stress. Global myocardial blood flow reserve was 2.24 and was normal.   Coronary calcium  was present on the attenuation correction CT images. Severe coronary calcifications were present. Coronary calcifications were present in the left anterior descending  artery, left circumflex artery and right coronary artery distribution(s). Aortic atherosclerosis noted.  Aortic valve calcification noted.   Electronically Signed  By: Stanly Leavens M.D. 11/20/23:  FINDINGS: No evidence for lymphadenopathy within the visualized mediastinum or hilar regions. Circumferential wall thickening noted distal esophagus, similar to prior study.   The visualized lung parenchyma shows no suspicious pulmonary nodule or mass. No focal airspace consolidation. No effusion.   Diffusely decreased attenuation in the right hepatic lobe with relative sparing of the left lobe suggest fatty deposition.   No worrisome lytic or sclerotic osseous abnormality.   IMPRESSION: 1. Circumferential wall thickening in the distal esophagus, similar to prior study. Esophagitis would be a consideration. 2. Hepatic steatosis.   Cardiac CT 08/2022: IMPRESSION: No acute abnormality. Circumferential thickening of the esophagus may be to esophagitis however underlying mass is not excluded. This is similar to chest CT 04/21/2019. Correlation with endoscopy is recommended if not previously. Hepatic steatosis.  IMPRESSION: 1. Coronary calcium  score of 1504. This was 61 percentile for age and sex matched control.   2. Normal coronary origin with right dominance.   3. CAD-RADS 3. Moderate stenosis. Consider symptom-guided anti-ischemic pharmacotherapy as well as risk factor modification per guideline directed care. CT FFR could not be performed due to significant motion artifact. Therefore if symptoms persist with medical therapy consider other forms of diagnostic testing.   Cardiac Cath 09/01/2021: The LAD has mild non-obstructive disease The Circumflex has mild non-obstructive disease The intermediate branch is a moderate caliber vessel with moderate stenosis, unchanged from last cath in 2019 The RCA is a large dominant vessel with mild diffuse calcific disease in the  proximal, mid and distal vessel. The small caliber PDA has severe stenosis, overall unchanged from cath in 2019. The PDA is too small for PCI.  Normal LV systolic function. Normal LVEDP.    Recommendations: The PDA is too small for PCI and is unchanged from cath in 2019. Her diffuse mild to moderate CAD is overall unchanged. Continue medical management of CAD.   Past Medical History:  Diagnosis Date   Allergy    allergic rhinitis   Arthritis    Atypical chest pain    Constipation    Cyst, ovarian    Diabetes (HCC)    Diabetes mellitus, type II (HCC)    Elevated alkaline phosphatase level    Endometriosis    Esophageal stricture    Fatty liver    Gastroparesis    GERD (gastroesophageal reflux disease)    Heart murmur    Hepatic hemangioma    Hyperlipidemia    Hypertension    Hypertrophic condition of skin    acrokeratoelastoidosis- s/p 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 1/08- benign   Iron  deficiency anemia    Migraine headache    Non-compliance    Peptic ulcer disease    Thyroid  disease      Past Surgical History:  Procedure Laterality Date   ABDOMINAL EXPLORATION SURGERY     w/bso    BREAST BIOPSY     CARDIAC  CATHETERIZATION  2009   mild non obstructive CAD   CHOLECYSTECTOMY     COLONOSCOPY     ESOPHAGEAL MANOMETRY N/A 08/01/2020   Procedure: ESOPHAGEAL MANOMETRY (EM);  Surgeon: Leigh Elspeth SQUIBB, MD;  Location: [facility];  Service: Gastroenterology;  Laterality: N/A;   KNEE SURGERY  2005    left knee   LEFT HEART CATH AND CORONARY ANGIOGRAPHY N/A 07/17/2017   Procedure: LEFT HEART CATH AND CORONARY ANGIOGRAPHY;  Surgeon: [place], Peter M, MD;  Location: [facility] INVASIVE CV LAB;  Service: Cardiovascular;  Laterality: N/A;   LEFT HEART CATH AND CORONARY ANGIOGRAPHY N/A 09/01/2021   Procedure: LEFT HEART CATH AND CORONARY ANGIOGRAPHY;  Surgeon: Verlin Lonni BIRCH, MD;  Location: [facility] INVASIVE CV LAB;  Service: Cardiovascular;  Laterality: N/A;   POLYPECTOMY     TOTAL ABDOMINAL  HYSTERECTOMY     UPPER GASTROINTESTINAL ENDOSCOPY     Family History  Problem Relation Age of Onset   Hypertension Mother    Rheum arthritis Mother    Hypertension Father    Diabetes Father    Prostate cancer Father    Kidney disease Father    Diabetes Sister    Fibromyalgia Sister    Diabetes Maternal Grandmother    Lung cancer Maternal Grandfather    Arthritis Brother    Migraines Brother    CAD Brother    Fibromyalgia Sister    Migraines Sister    Colon cancer Neg Hx    Thyroid  disease Neg Hx    Colon polyps Neg Hx    Social History   Tobacco Use   Smoking status: Former    Current packs/day: 0.00    Average packs/day: 0.5 packs/day for 18.0 years (9.0 ttl pk-yrs)    Types: Cigarettes    Start date: 07/29/1977    Quit date: 06/25/1994    Years since quitting: 29.5   Smokeless tobacco: Never   Tobacco comments:    quit 19 years ago  Vaping Use   Vaping status: Never Used  Substance Use Topics   Alcohol use: Yes    Alcohol/week: 0.0 standard drinks of alcohol    Comment: social drinker   Drug use: No   Current Outpatient Medications  Medication Sig Dispense Refill   Accu-Chek Softclix Lancets lancets 3 (three) times daily.     albuterol  (VENTOLIN  HFA) 108 (90 Base) MCG/ACT inhaler INHALE 2 PUFFS BY MOUTH 4 TIMES DAILY 9 g 0   alclomethasone (ACLOVATE ) 0.05 % cream Apply topically 2 (two) times daily as needed (Rash). 180 g 3   aspirin  81 MG chewable tablet Chew 1 tablet (81 mg total) by mouth daily.     Blood Glucose Monitoring Suppl DEVI 1 each by Does not apply route in the morning, at noon, and at bedtime. May substitute to any manufacturer covered by patient's insurance. 1 each 0   chlorthalidone  (HYGROTON ) 25 MG tablet Take 1 tablet (25 mg total) by mouth daily. 90 tablet 3   ciprofloxacin -dexamethasone  (CIPRODEX ) OTIC suspension 4 drops to right ear twice daily     clobetasol  (TEMOVATE ) 0.05 % external solution Apply 1 application topically 2 (two) times  daily. 50 mL 6   DULoxetine  (CYMBALTA ) 60 MG capsule Take 1 capsule (60 mg total) by mouth daily. 90 capsule 1   Evolocumab  (REPATHA  SURECLICK) 140 MG/ML SOAJ INJECT 140 MG INTO THE SKIN EVERY 14 DAYS 6 mL 3   fluticasone  (FLONASE ) 50 MCG/ACT nasal spray Place 2 sprays into both nostrils daily. 16 g 1  furosemide  (LASIX ) 20 MG tablet Take 1 tablet (20 mg total) by mouth daily as needed (for shortness of breath or weight gain of 2 lbs overnight or 5 lbs in a week). 45 tablet 3   glucose blood (ACCU-CHEK GUIDE TEST) test strip USE 1 EACH BY IN VITRO ROUTE IN ETH MORNING, AT NOON, AND AT BEDTIME 100 each 2   isosorbide  mononitrate (IMDUR ) 60 MG 24 hr tablet TAKE 1 & 1/2 (ONE & ONE-HALF) TABLETS BY MOUTH ONCE DAILY 135 tablet 3   levothyroxine (SYNTHROID) 112 MCG tablet Take by mouth.     loratadine  (CLARITIN ) 10 MG tablet Take 1 tablet (10 mg total) by mouth daily. 10 tablet 0   meclizine  (ANTIVERT ) 12.5 MG tablet Take 1 tablet (12.5 mg total) by mouth 3 (three) times daily as needed for dizziness. 30 tablet 0   meloxicam  (MOBIC ) 15 MG tablet Take by mouth.     methimazole  (TAPAZOLE ) 5 MG tablet Take 5 mg daily 30 tablet 5   methocarbamol  (ROBAXIN ) 500 MG tablet Take 1 tablet (500 mg total) by mouth at bedtime as needed for muscle spasms. 30 tablet 2   Multiple Vitamins-Minerals (MULTIVITAMIN WITH MINERALS) tablet Take 1 tablet by mouth daily.     ondansetron  (ZOFRAN -ODT) 4 MG disintegrating tablet Take 1 tablet (4 mg total) by mouth every 8 (eight) hours as needed for nausea or vomiting. 60 tablet 1   potassium chloride  SA (KLOR-CON  M20) 20 MEQ tablet Take 2 tablets (40 mEq total) by mouth daily. 180 tablet 3   SUMAtriptan  (IMITREX ) 50 MG tablet Take 50 mg by mouth daily as needed for migraine.     tretinoin  (RETIN-A ) 0.025 % cream Apply topically at bedtime. For the 1st month apply 2 nights weekly after one month increase to M-W-F nights, cut back usage to 1-2 nights weekly if you experience  excessive dryness, 45 g 2   Vitamin D , Ergocalciferol , (DRISDOL ) 1.25 MG (50000 UNIT) CAPS capsule Take 1 capsule (50,000 Units total) by mouth every 7 (seven) days. 12 capsule 0   XIIDRA 5 % SOLN Apply 1 drop to eye 2 (two) times daily.     amLODipine  (NORVASC ) 10 MG tablet Take 1 tablet (10 mg total) by mouth daily. 90 tablet 0   FARXIGA  5 MG TABS tablet Take 1 tablet (5 mg total) by mouth daily before breakfast. 90 tablet 0   metoCLOPramide  (REGLAN ) 5 MG tablet TAKE 1 TABLET BY MOUTH THREE TIMES DAILY BEFORE MEALS. SCHEDULE WITH GI FOR FUTHER REFILLS (Patient not taking: Reported on 01/20/2024) 90 tablet 0   pantoprazole  (PROTONIX ) 40 MG tablet Take 1 tablet (40 mg total) by mouth 2 (two) times daily.     rosuvastatin  (CRESTOR ) 40 MG tablet Take 1 tablet (40 mg total) by mouth daily. 90 tablet 0   No current facility-administered medications for this visit.   Allergies  Allergen Reactions   Metformin  Anxiety    Vomiting, Anxiety, Cramping, Diarrhea   Ace Inhibitors Cough   Celebrex [Celecoxib] Other (See Comments)    makes me bleed   Diovan  [Valsartan ]     angioedema     Review of Systems: All systems reviewed and negative except where noted in HPI.    Lab Results  Component Value Date   WBC 8.6 12/06/2023   HGB 12.1 12/06/2023   HCT 40.4 12/06/2023   MCV 73.9 (L) 12/06/2023   PLT 252 12/06/2023    Lab Results  Component Value Date   NA 139 12/23/2023  CL 99 12/23/2023   K 3.7 12/23/2023   CO2 22 12/23/2023   BUN 11 12/23/2023   CREATININE 0.94 12/23/2023   EGFR 67 12/23/2023   CALCIUM  9.8 12/23/2023   ALBUMIN 4.0 04/16/2023   GLUCOSE 118 (H) 12/23/2023    Lab Results  Component Value Date   IRON  36 (L) 12/06/2023   TIBC 293 12/06/2023   FERRITIN 948 (H) 12/06/2023    Fibrosis 4 Score = .92 (Low risk)        Interpretation for patients with NAFLD          <1.30       -  F0-F1 (Low risk)          1.30-2.67 -  Indeterminate           >2.67      -  F3-F4  (High risk)     Validated for ages 23-65      Score is based on outdated labs. ALT, AST, and platelets should all be measured within the last 6 months for an accurate FIB-4 Score   Physical Exam: BP 122/70   Pulse (!) 58   Ht 5' 4 (1.626 m)   Wt 223 lb (101.2 kg)   BMI 38.28 kg/m  Constitutional: Pleasant,well-developed, female in no acute distress. Neurological: Alert and oriented to person place and time. Psychiatric: Normal mood and affect. Behavior is normal.   ASSESSMENT: 63 y.o. female here for assessment of the following  1. Dysphagia, unspecified type   2. Gastroesophageal reflux disease, unspecified whether esophagitis present   3. Abnormal CT scan, esophagus   4. Nausea without vomiting   5. Metabolic dysfunction-associated steatotic liver disease (MASLD)   6. Anemia, unspecified type    As above, intermittent dysphagia over the years.  She also has reflux on chronic PPI and imaging of her esophagus showing distal esophageal thickening.  Dysphagia I suspect multifactorial.  She has had jackhammer esophagus noted on manometry in the past and medical therapy for this has not really made much of a significant change.  I did not empiric dilation at the last EGD which seems to have helped her more than anything.  Dysphagia has recurred over time.  I offered her an EGD with dilation to see if we can help with this, she wanted to proceed with it following discussion of risks and benefits.  This will also evaluate thickening of her distal esophagus noted on imaging, although she has had this finding for years and her EGD in 2023 showed no cause for this.  I do not think she has a neoplasm in regards to these findings and provided reassurance.  We will reassess with her EGD.  Her reflux seems better controlled on PPI but still has breakthrough.  Offered increasing her Protonix  to twice daily dosing to see if that will provide any additional benefit.  We did discuss long-term risks  of chronic PPI, long-term use lowest dose needed to control symptoms.  She does have a history of remote diagnosis of gastroparesis in [facility] at [facility].  She has been on low-dose Reglan  for some time, perhaps she has some tachyphylaxis with this.  Will stop it after discussion of options, she states she does better using Zofran  as needed and will use that and refill it.  Counseled her on MASLD, she does not drink much of any alcohol.  She has been working on weight loss and her diabetes, has lost some weight recently.  Encouraged her to continue to  do that.  LFTs and platelet count normal.  Fib 4 score is low.  Continue to work on weight loss, should see us  annually for this issue.  Reviewed her anemia with her, panel seems most consistent with that of anemia of chronic disease.  She has maintained a mild microcytic anemia in the setting of normal iron  studies in the past, ferritin has been elevated chronically with also chronic ESR elevation and is being followed by rheumatology for her arthritis, could be related.  I do not think she needs a colonoscopy right now in the absence of any rectal bleeding or problems with her bowels   PLAN: - EGD at the [facility] with dilation - see if helps her again, otherwise if not could be due to jackhammer esophagus (failed prior medical therapies) - increase protonix  to 40mg  BID until EGD. Discussed long-term risks of chronic PPI use, long-term use the lowest dose needed to control symptoms - refill Zofran  4mg  ODT every 8 hours PRN - holding Reglan  for now, using sparingly - counseled on fatty liver, working on weight loss, has lost 7 lbs recently, risks for fibrosis / cirrhosis discussed, monitor LFTs yearly - counseled on anemia - seems more so c/w chronic disease, elevated ferritin, chronic anemia with microcytosis even with normal iron  levels historically  Marcey Naval, MD [facility]:

## 2024-01-21 ENCOUNTER — Ambulatory Visit: Payer: Self-pay | Admitting: Dermatology

## 2024-01-27 ENCOUNTER — Encounter: Payer: Self-pay | Admitting: Neurology

## 2024-01-28 ENCOUNTER — Other Ambulatory Visit (HOSPITAL_COMMUNITY)

## 2024-02-07 ENCOUNTER — Other Ambulatory Visit: Payer: Self-pay

## 2024-02-07 ENCOUNTER — Emergency Department (HOSPITAL_COMMUNITY)
Admission: EM | Admit: 2024-02-07 | Discharge: 2024-02-08 | Disposition: A | Discharge status: Home / Self Care | Attending: Emergency Medicine | Admitting: Emergency Medicine

## 2024-02-07 DIAGNOSIS — Y902 Blood alcohol level of 40-59 mg/100 ml: Secondary | ICD-10-CM | POA: Diagnosis not present

## 2024-02-07 DIAGNOSIS — F10120 Alcohol abuse with intoxication, uncomplicated: Secondary | ICD-10-CM | POA: Insufficient documentation

## 2024-02-07 DIAGNOSIS — I10 Essential (primary) hypertension: Secondary | ICD-10-CM | POA: Diagnosis not present

## 2024-02-07 DIAGNOSIS — Z7982 Long term (current) use of aspirin: Secondary | ICD-10-CM | POA: Insufficient documentation

## 2024-02-07 DIAGNOSIS — E119 Type 2 diabetes mellitus without complications: Secondary | ICD-10-CM | POA: Insufficient documentation

## 2024-02-07 DIAGNOSIS — F10920 Alcohol use, unspecified with intoxication, uncomplicated: Secondary | ICD-10-CM

## 2024-02-07 DIAGNOSIS — Z79899 Other long term (current) drug therapy: Secondary | ICD-10-CM | POA: Diagnosis not present

## 2024-02-07 MED ORDER — SODIUM CHLORIDE 0.9 % IV BOLUS
1000.0000 mL | Freq: Once | INTRAVENOUS | Status: AC
  Administered 2024-02-08: 1000 mL via INTRAVENOUS

## 2024-02-07 MED ORDER — ONDANSETRON HCL 4 MG/2ML IJ SOLN
4.0000 mg | Freq: Once | INTRAMUSCULAR | Status: AC
  Administered 2024-02-08: 4 mg via INTRAVENOUS
  Filled 2024-02-07: qty 2

## 2024-02-07 NOTE — ED Provider Notes (Signed)
 [place] [facility]GENC[facility] DEPARTMENT AT [facility] Provider Note   CSN: [id number] Arrival date & time: 02/07/24  2241     Patient presents with: No chief complaint on file.   [name] is a 63 y.o. female with extensive medical history to include atypical chest pain, diabetes, GERD, fatty liver, hypertension, iron  deficiency anemia, peptic ulcer disease.  Patient presents to ED for evaluation of EtOH use.  Reports that she has been drinking all day, E&J.  She is unsure how much of that she drank.  States she began drinking this evening around 5 PM and stopped around 10 PM.  Reports she has had 3 episodes of nausea and vomiting since then.  Denies abdominal pain, diarrhea.  Denies chest pain or shortness of breath.  Denies drug use.  Alert and oriented x 4.  Denies SI, HI, AVH.  Here with family who report patient is not an everyday drinker.  No history of alcohol withdrawal seizures.  HPI     Prior to Admission medications   Medication Sig Start Date End Date Taking? Authorizing Provider  Accu-Chek Softclix Lancets lancets 3 (three) times daily. 08/16/23   [provider]  albuterol  (VENTOLIN  HFA) 108 (90 Base) MCG/ACT inhaler INHALE 2 PUFFS BY MOUTH 4 TIMES DAILY 08/18/21   O'Sullivan, Melissa, NP  alclomethasone (ACLOVATE ) 0.05 % cream Apply topically 2 (two) times daily as needed (Rash). 01/26/21   Sheffield, Kelli R, PA-C  amLODipine  (NORVASC ) 10 MG tablet Take 1 tablet (10 mg total) by mouth daily. 01/20/24   Daryl Setter, NP  aspirin  81 MG chewable tablet Chew 1 tablet (81 mg total) by mouth daily. 07/19/17   Jadine Toribio SQUIBB, MD  Blood Glucose Monitoring Suppl DEVI 1 each by Does not apply route in the morning, at noon, and at bedtime. May substitute to any manufacturer covered by patient's insurance. 08/16/23   Antonio Cyndee Jamee JONELLE, DO  chlorthalidone  (HYGROTON ) 25 MG tablet Take 1 tablet (25 mg total) by mouth daily. 09/02/23 01/20/24  Dunn, Dayna N, PA-C   ciprofloxacin -dexamethasone  (CIPRODEX ) OTIC suspension 4 drops to right ear twice daily 12/19/23   [provider]  clobetasol  (TEMOVATE ) 0.05 % external solution Apply 1 application topically 2 (two) times daily. 01/26/21   Sheffield, Kelli R, PA-C  DULoxetine  (CYMBALTA ) 60 MG capsule Take 1 capsule (60 mg total) by mouth daily. 01/10/24   Jeannetta Lonni ORN, MD  Evolocumab  (REPATHA  SURECLICK) 140 MG/ML SOAJ INJECT 140 MG INTO THE SKIN EVERY 14 DAYS 01/20/24   Okey Vina GAILS, MD  FARXIGA  5 MG TABS tablet Take 1 tablet (5 mg total) by mouth daily before breakfast. 01/20/24   O'Sullivan, Melissa, NP  fluticasone  (FLONASE ) 50 MCG/ACT nasal spray Place 2 sprays into both nostrils daily. 01/17/21   Saguier, Dallas, PA-C  furosemide  (LASIX ) 20 MG tablet Take 1 tablet (20 mg total) by mouth daily as needed (for shortness of breath or weight gain of 2 lbs overnight or 5 lbs in a week). 08/22/22   Lucien Orren SAILOR, PA-C  glucose blood (ACCU-CHEK GUIDE TEST) test strip USE 1 EACH BY IN VITRO ROUTE IN [place], AT NOON, AND AT BEDTIME 10/23/23   O'Sullivan, Melissa, NP  isosorbide  mononitrate (IMDUR ) 60 MG 24 hr tablet TAKE 1 & 1/2 (ONE & ONE-HALF) TABLETS BY MOUTH ONCE DAILY 12/10/22   Ross, Paula V, MD  levothyroxine (SYNTHROID) 112 MCG tablet Take by mouth.    [provider]  loratadine  (CLARITIN ) 10 MG tablet  Take 1 tablet (10 mg total) by mouth daily. 07/15/20   Geiple, Joshua, PA-C  meclizine  (ANTIVERT ) 12.5 MG tablet Take 1 tablet (12.5 mg total) by mouth 3 (three) times daily as needed for dizziness. 10/01/23   O'Sullivan, Melissa, NP  meloxicam  (MOBIC ) 15 MG tablet Take by mouth. 12/20/23   [provider]  methimazole  (TAPAZOLE ) 5 MG tablet Take 5 mg daily 10/23/23   Trixie File, MD  methocarbamol  (ROBAXIN ) 500 MG tablet Take 1 tablet (500 mg total) by mouth at bedtime as needed for muscle spasms. 01/10/24   Rice, Lonni ORN, MD  metoCLOPramide  (REGLAN ) 5 MG tablet TAKE 1  TABLET BY MOUTH THREE TIMES DAILY BEFORE MEALS. SCHEDULE WITH GI FOR FUTHER REFILLS Patient not taking: Reported on 01/20/2024 12/21/23   O'Sullivan, Melissa, NP  Multiple Vitamins-Minerals (MULTIVITAMIN WITH MINERALS) tablet Take 1 tablet by mouth daily. 12/09/23   O'Sullivan, Melissa, NP  ondansetron  (ZOFRAN -ODT) 4 MG disintegrating tablet Take 1 tablet (4 mg total) by mouth every 8 (eight) hours as needed for nausea or vomiting. 01/20/24   Armbruster, Elspeth SQUIBB, MD  pantoprazole  (PROTONIX ) 40 MG tablet Take 1 tablet (40 mg total) by mouth 2 (two) times daily. 01/20/24   Armbruster, Elspeth SQUIBB, MD  potassium chloride  SA (KLOR-CON  M20) 20 MEQ tablet Take 2 tablets (40 mEq total) by mouth daily. 09/02/23 01/20/24  Dunn, Dayna N, PA-C  rosuvastatin  (CRESTOR ) 40 MG tablet Take 1 tablet (40 mg total) by mouth daily. 01/20/24   O'Sullivan, Melissa, NP  SUMAtriptan  (IMITREX ) 50 MG tablet Take 50 mg by mouth daily as needed for migraine.    [provider]  tretinoin  (RETIN-A ) 0.025 % cream Apply topically at bedtime. For the 1st month apply 2 nights weekly after one month increase to M-W-F nights, cut back usage to 1-2 nights weekly if you experience excessive dryness, 08/21/23 08/20/24  Alm Delon SAILOR, DO  Vitamin D , Ergocalciferol , (DRISDOL ) 1.25 MG (50000 UNIT) CAPS capsule Take 1 capsule (50,000 Units total) by mouth every 7 (seven) days. 12/09/23   Daryl Setter, NP  XIIDRA 5 % SOLN Apply 1 drop to eye 2 (two) times daily. 06/12/22   [provider]    Allergies: Metformin , Ace inhibitors, Celebrex [celecoxib], and Diovan  [valsartan ]    Review of Systems  Gastrointestinal:  Positive for nausea and vomiting.  All other systems reviewed and are negative.   Updated Vital Signs BP 124/73 (BP Location: Right Arm)   Pulse 80   Temp 98 F (36.7 C) (Oral)   Resp 18   Ht 5' 4 (1.626 m)   Wt 101.2 kg   SpO2 95%   BMI 38.28 kg/m   Physical Exam Vitals and nursing note reviewed.   Constitutional:      General: She is not in acute distress.    Appearance: She is well-developed.  HENT:     Head: Normocephalic and atraumatic.  Eyes:     Conjunctiva/sclera: Conjunctivae normal.  Cardiovascular:     Rate and Rhythm: Normal rate and regular rhythm.     Heart sounds: No murmur heard.    Comments: No tachycardia Pulmonary:     Effort: Pulmonary effort is normal. No respiratory distress.     Breath sounds: Normal breath sounds.  Abdominal:     Palpations: Abdomen is soft.     Tenderness: There is no abdominal tenderness.     Comments: No tenderness to abdomen  Musculoskeletal:        General: No swelling.  Cervical back: Neck supple.  Skin:    General: Skin is warm and dry.     Capillary Refill: Capillary refill takes less than 2 seconds.  Neurological:     Mental Status: She is alert.     Comments: No tremors  Psychiatric:        Mood and Affect: Mood normal.     (all labs ordered are listed, but only abnormal results are displayed) Labs Reviewed  CBC - Abnormal; Notable for the following components:      Result Value   RBC 5.57 (*)    MCV 73.8 (*)    MCH 21.9 (*)    MCHC 29.7 (*)    All other components within normal limits  COMPREHENSIVE METABOLIC PANEL WITH GFR - Abnormal; Notable for the following components:   Glucose, Bld 124 (*)    All other components within normal limits  ETHANOL - Abnormal; Notable for the following components:   Alcohol, Ethyl (B) 52 (*)    All other components within normal limits    EKG: None  Radiology: No results found.   Procedures   Medications Ordered in the ED  ondansetron  (ZOFRAN ) injection 4 mg (4 mg Intravenous Given 02/08/24 0047)  sodium chloride  0.9 % bolus 1,000 mL (1,000 mLs Intravenous New Bag/Given 02/08/24 0048)    Medical Decision Making Amount and/or Complexity of Data Reviewed Labs: ordered. ECG/medicine tests: ordered.  Risk Prescription drug management.   This is a  63 year old female presenting to the ED out of concern of alcohol intoxication with secondary nausea and vomiting.  On examination, she is afebrile and nontachycardic.  Lung sounds clear bilaterally, no hypoxia.  Abdomen soft and compressible.  Neuroexam at baseline.  Alert and oriented x 4.  No SI, HI, AVH.  Patient complaining of nausea as a result of alcohol ingestion earlier this evening.  Denies history of alcohol withdrawal seizures in the past.  No tachycardia, no hypotension here.  Given 1 L of fluid, 4 of Zofran .  Labs are collected which are grossly unremarkable.  Ethanol resulting at 52.  EKG nonischemic.  On reassessment patient reports she feels much better.  Requesting discharge.  Patient discharged home at this time.  Given return precautions and she voiced understanding.  Stable to discharge home.   Final diagnoses:  Alcoholic intoxication without complication [facility])    ED Discharge Orders     None          Ruthell Lonni JULIANNA DEVONNA 02/08/24 0146    Griselda Norris, MD 02/08/24 [phone]

## 2024-02-07 NOTE — ED Triage Notes (Signed)
 Pt BIB [facility]. EMS reports pt began experiencing nausea and vomiting and became diaphoretic after drinking ENJ today. A&Ox4. Pt denies pain.   Pt sister at beside reports pt has cardiac hx.   EMS VS cbg 162, BP 150 palp, 99% RA, HR

## 2024-02-08 DIAGNOSIS — F10120 Alcohol abuse with intoxication, uncomplicated: Secondary | ICD-10-CM | POA: Diagnosis not present

## 2024-02-08 LAB — CBC
HCT: 41.1 % (ref 36.0–46.0)
Hemoglobin: 12.2 g/dL (ref 12.0–15.0)
MCH: 21.9 pg — ABNORMAL LOW (ref 26.0–34.0)
MCHC: 29.7 g/dL — ABNORMAL LOW (ref 30.0–36.0)
MCV: 73.8 fL — ABNORMAL LOW (ref 80.0–100.0)
Platelets: 255 K/uL (ref 150–400)
RBC: 5.57 MIL/uL — ABNORMAL HIGH (ref 3.87–5.11)
RDW: 15 % (ref 11.5–15.5)
WBC: 8.8 K/uL (ref 4.0–10.5)
nRBC: 0 % (ref 0.0–0.2)

## 2024-02-08 LAB — COMPREHENSIVE METABOLIC PANEL WITH GFR
ALT: 27 U/L (ref 0–44)
AST: 21 U/L (ref 15–41)
Albumin: 3.8 g/dL (ref 3.5–5.0)
Alkaline Phosphatase: 106 U/L (ref 38–126)
Anion gap: 12 (ref 5–15)
BUN: 12 mg/dL (ref 8–23)
CO2: 23 mmol/L (ref 22–32)
Calcium: 9.3 mg/dL (ref 8.9–10.3)
Chloride: 104 mmol/L (ref 98–111)
Creatinine, Ser: 0.85 mg/dL (ref 0.44–1.00)
GFR, Estimated: >60 mL/min (ref >60)
Glucose, Bld: 124 mg/dL — ABNORMAL HIGH (ref 70–99)
Potassium: 3.9 mmol/L (ref 3.5–5.1)
Sodium: 139 mmol/L (ref 135–145)
Total Bilirubin: 0.3 mg/dL (ref 0.0–1.2)
Total Protein: 7.7 g/dL (ref 6.5–8.1)

## 2024-02-08 LAB — ETHANOL: Alcohol, Ethyl (B): 52 mg/dL — ABNORMAL HIGH (ref <15)

## 2024-02-08 NOTE — Discharge Instructions (Signed)
 It was a pleasure taking part in your care.  As we discussed, your laboratory evaluation here is reassuring.  Please discontinue use of excessive amounts of alcohol in the future.  Please follow-up with your PCP.  Return to the ED with any new symptoms.

## 2024-02-16 ENCOUNTER — Ambulatory Visit
Admission: RE | Admit: 2024-02-16 | Discharge: 2024-02-16 | Disposition: A | Discharge status: Home / Self Care | Source: Ambulatory Visit | Attending: Neurology

## 2024-02-16 DIAGNOSIS — R42 Dizziness and giddiness: Secondary | ICD-10-CM

## 2024-02-16 MED ORDER — GADOPICLENOL 0.5 MMOL/ML IV SOLN
10.0000 mL | Freq: Once | INTRAVENOUS | Status: AC | PRN
  Administered 2024-02-16: 10 mL via INTRAVENOUS

## 2024-02-20 ENCOUNTER — Ambulatory Visit (INDEPENDENT_AMBULATORY_CARE_PROVIDER_SITE_OTHER): Admitting: Dermatology

## 2024-02-20 ENCOUNTER — Encounter: Payer: Self-pay | Admitting: Dermatology

## 2024-02-20 VITALS — BP 131/85

## 2024-02-20 DIAGNOSIS — L732 Hidradenitis suppurativa: Secondary | ICD-10-CM

## 2024-02-20 DIAGNOSIS — Z7189 Other specified counseling: Secondary | ICD-10-CM

## 2024-02-20 DIAGNOSIS — L700 Acne vulgaris: Secondary | ICD-10-CM

## 2024-02-20 DIAGNOSIS — B078 Other viral warts: Secondary | ICD-10-CM

## 2024-02-20 MED ORDER — DOXYCYCLINE HYCLATE 100 MG PO TABS: 100.0000 mg | ORAL_TABLET | Freq: Two times a day (BID) | ORAL | 5 refills | Status: AC

## 2024-02-20 MED ORDER — TRETINOIN 0.05 % EX CREA: TOPICAL_CREAM | Freq: Every day | CUTANEOUS | 0 refills | Status: AC

## 2024-02-20 NOTE — Progress Notes (Signed)
 Follow-Up Visit   Subjective  [name] is a 63 y.o. female established patient who presents for FOLLOW UP on the diagnoses listed below:  Patient was last evaluated on 12/31/23.   EDV: Apply La Roche-Posay lotion containing urea. Prescribe tretinoin  0.025% cream for application in ears, followed by Vaseline. Patient reports sxs are unchanged - she stated she still see's black heads in the ears.   HS: Well controlled at last OV. Patient stated that she remains stable today.    Acne: Tretinoin  0.025% that she have increased to using M-F with no dryness or irritation.    Are you nursing, pregnant or trying to conceive? No   The following portions of the chart were reviewed this encounter and updated as appropriate: medications, allergies, medical history  Review of Systems:  No other skin or systemic complaints except as noted in HPI or Assessment and Plan.  Objective  Well appearing patient in no apparent distress; mood and affect are within normal limits.   A focused examination was performed of the following areas: scattered   Relevant exam findings are noted in the Assessment and Plan.                              Assessment & Plan   1. Verruca (Epidermodysplasia Verruciformis) - Assessment: Biopsy confirmed verruca, consistent with Epidermodysplasia Verruciformis (EDV) diagnosis. Patient reports wart-like growths present since birth, initially on legs and hands, later appearing on face. Tretinoin  treatment was previously prescribed for face but caused irritation. Patient has not yet started tretinoin  treatment on legs. - Plan:    Increase tretinoin  strength to 0.05% for face    Apply every other night using pea-sized amount, dab on face    Apply thick layer of moisturizer on top    Start tretinoin  0.05% on legs mixed with quarter-sized amount of moisturizer    Apply every other night, reduce to every second night if irritation occurs    Provide  samples of face cream with coupon option if patient likes samples    Educate patient that lesions won't disappear but should become smoother  2. Hidradenitis Suppurativa - Assessment: Patient's H.S. is stable and well-controlled. Patient is using benzoyl peroxide wash as prescribed. Last flare occurred a while ago, suggesting good control. Patient does not currently have doxycycline  for flares. - Plan:    Continue benzoyl peroxide wash    Prescribe doxycycline  for flares    Take one tablet with breakfast and one with dinner for a week when a flare starts  3. Skin Cancer Prevention - Assessment: Patient reports minimal sun exposure. No suspicious lesions noted on examination today. No family history of EDV reported. Importance of sun protection for skin cancer prevention discussed. - Plan:    Educate patient on importance of sunscreen use for skin cancer prevention    Recommend regular sunscreen application when exposed to sun  Follow-up in 6 months to assess response to tretinoin  treatment modifications.  ACNE VULGARIS   Related Medications tretinoin  (RETIN-A ) 0.05 % cream Apply topically at bedtime. apply pea size (1gm) amount to face 2-3 nights a week. HIDRADENITIS SUPPURATIVA   Related Medications doxycycline  (VIBRA -TABS) 100 MG tablet Take 1 tablet (100 mg total) by mouth 2 (two) times daily. Se for flares  Return in about 6 months (around 08/22/2024) for HS, ACNE & EDV.   Documentation: I have reviewed the above documentation for accuracy and completeness, and I agree  with the above.  I, Shirron Maranda, CMA, am acting as scribe for [employer], DO.   Delon Lenis, DO

## 2024-02-20 NOTE — Patient Instructions (Addendum)
 Date: Thu Feb 20 2024  Hello [name],  Thank you for visiting today. Here is a summary of the key instructions:  - Medications:   - Tretinoin  0.05% for face: Apply every other night, use a pea-sized amount, dab on face, apply thick layer of moisturizer on top   - Tretinoin  for legs: Mix quarter-sized amount of moisturizer with dime-sized amount of tretinoin , apply every other night, if irritation occurs reduce to every second night   - Doxycycline  for H.S. flares: Take one tablet with breakfast and one with dinner for a week when a flare starts  - Skin Care:   - Continue using benzoyl peroxide wash for H.S.   - Wear sunscreen to prevent skin cancers  - Follow-up:   - Return for follow-up appointment in 6 months   Please reach out if you have any questions or concerns.  Warm regards,  Dr. Delon Lenis Dermatology Important Information  Due to recent changes in healthcare laws, you may see results of your pathology and/or laboratory studies on MyChart before the doctors have had a chance to review them. We understand that in some cases there may be results that are confusing or concerning to you. Please understand that not all results are received at the same time and often the doctors may need to interpret multiple results in order to provide you with the best plan of care or course of treatment. Therefore, we ask that you please give us  2 business days to thoroughly review all your results before contacting the office for clarification. Should we see a critical lab result, you will be contacted sooner.   If You Need Anything After Your Visit  If you have any questions or concerns for your doctor, please call our main line at [phone] If no one answers, please leave a voicemail as directed and we will return your call as soon as possible. Messages left after 4 pm will be answered the following business day.   You may also send us  a message via MyChart. We typically respond to  MyChart messages within 1-2 business days.  For prescription refills, please ask your pharmacy to contact our office. Our fax number is [phone].  If you have an urgent issue when the clinic is closed that cannot wait until the next business day, you can page your doctor at the number below.    Please note that while we do our best to be available for urgent issues outside of office hours, we are not available 24/7.   If you have an urgent issue and are unable to reach us , you may choose to seek medical care at your doctor's office, retail clinic, urgent care center, or emergency room.  If you have a medical emergency, please immediately call 911 or go to the emergency department. In the event of inclement weather, please call our main line at [phone] for an update on the status of any delays or closures.  Dermatology Medication Tips: Please keep the boxes that topical medications come in in order to help keep track of the instructions about where and how to use these. Pharmacies typically print the medication instructions only on the boxes and not directly on the medication tubes.   If your medication is too expensive, please contact our office at [phone] or send us  a message through MyChart.   We are unable to tell what your co-pay for medications will be in advance as this is different depending on your insurance coverage. However, we may  be able to find a substitute medication at lower cost or fill out paperwork to get insurance to cover a needed medication.   If a prior authorization is required to get your medication covered by your insurance company, please allow us  1-2 business days to complete this process.  Drug prices often vary depending on where the prescription is filled and some pharmacies may offer cheaper prices.  The website www.goodrx.com contains coupons for medications through different pharmacies. The prices here do not account for what the cost may be with  help from insurance (it may be cheaper with your insurance), but the website can give you the price if you did not use any insurance.  - You can print the associated coupon and take it with your prescription to the pharmacy.  - You may also stop by our office during regular business hours and pick up a GoodRx coupon card.  - If you need your prescription sent electronically to a different pharmacy, notify our office through [facility] or by phone at [phone]

## 2024-02-28 ENCOUNTER — Ambulatory Visit: Admitting: Gastroenterology

## 2024-02-28 ENCOUNTER — Encounter: Payer: Self-pay | Admitting: Gastroenterology

## 2024-02-28 VITALS — BP 146/82 | HR 80 | Temp 98.0°F | Resp 20 | Ht 64.0 in | Wt 223.0 lb

## 2024-02-28 DIAGNOSIS — K2289 Other specified disease of esophagus: Secondary | ICD-10-CM | POA: Diagnosis present

## 2024-02-28 DIAGNOSIS — K3189 Other diseases of stomach and duodenum: Secondary | ICD-10-CM

## 2024-02-28 DIAGNOSIS — R1310 Dysphagia, unspecified: Secondary | ICD-10-CM

## 2024-02-28 DIAGNOSIS — K219 Gastro-esophageal reflux disease without esophagitis: Secondary | ICD-10-CM | POA: Diagnosis not present

## 2024-02-28 DIAGNOSIS — K449 Diaphragmatic hernia without obstruction or gangrene: Secondary | ICD-10-CM

## 2024-02-28 MED ORDER — SODIUM CHLORIDE 0.9 % IV SOLN: 500.0000 mL | Freq: Once | INTRAVENOUS | Status: DC

## 2024-02-28 NOTE — Progress Notes (Signed)
 [facility] History and Physical   Primary Care Physician:  Daryl Setter, NP   Reason for Procedure:   Dysphagia, GERD  Plan:    EGD with dilation     HPI: [name] is a 63 y.o. female  here for EGD and possible dilation - has had an exam in 03/2022 with empiric dilation, had mucosal disruption at site of suspected mild stricture just inferior to UES. SYmptoms have recurred as well as worsening GERD - protonix  increased to 40mg  BID and reglan  use as well for history of gastroparesis.   Otherwise feels well without any cardiopulmonary symptoms.   I have discussed risks / benefits of anesthesia and endoscopic procedure with Ryin G Kolton and they wish to proceed with the exams as outlined today.    Past Medical History:  Diagnosis Date   Allergy    allergic rhinitis   Arthritis    Atypical chest pain    Constipation    Cyst, ovarian    Diabetes (HCC)    Diabetes mellitus, type II (HCC)    Elevated alkaline phosphatase level    Endometriosis    Esophageal stricture    Fatty liver    Gastroparesis    GERD (gastroesophageal reflux disease)    Heart murmur    Hepatic hemangioma    Hyperlipidemia    Hypertension    Hypertrophic condition of skin    acrokeratoelastoidosis- s/p 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 1/08- benign   Iron  deficiency anemia    Migraine headache    Non-compliance    Peptic ulcer disease    Sleep apnea    Thyroid  disease     Past Surgical History:  Procedure Laterality Date   ABDOMINAL EXPLORATION SURGERY     w/bso    BREAST BIOPSY     CARDIAC CATHETERIZATION  2009   mild non obstructive CAD   CHOLECYSTECTOMY     COLONOSCOPY     ESOPHAGEAL MANOMETRY N/A 08/01/2020   Procedure: ESOPHAGEAL MANOMETRY (EM);  Surgeon: Leigh Elspeth SQUIBB, MD;  Location: [facility];  Service: Gastroenterology;  Laterality: N/A;   KNEE SURGERY  2005    left knee   LEFT HEART CATH AND CORONARY ANGIOGRAPHY N/A 07/17/2017   Procedure: LEFT HEART CATH  AND CORONARY ANGIOGRAPHY;  Surgeon: Swazil[place], Peter M, MD;  Location: [facility] INVASIVE CV LAB;  Service: Cardiovascular;  Laterality: N/A;   LEFT HEART CATH AND CORONARY ANGIOGRAPHY N/A 09/01/2021   Procedure: LEFT HEART CATH AND CORONARY ANGIOGRAPHY;  Surgeon: Verlin Lonni BIRCH, MD;  Location: [facility] INVASIVE CV LAB;  Service: Cardiovascular;  Laterality: N/A;   POLYPECTOMY     TOTAL ABDOMINAL HYSTERECTOMY     UPPER GASTROINTESTINAL ENDOSCOPY      Prior to Admission medications   Medication Sig Start Date End Date Taking? Authorizing Provider  Accu-Chek Softclix Lancets lancets 3 (three) times daily. 08/16/23   [provider]  albuterol  (VENTOLIN  HFA) 108 (90 Base) MCG/ACT inhaler INHALE 2 PUFFS BY MOUTH 4 TIMES DAILY 08/18/21   O'Sullivan, Melissa, NP  alclomethasone (ACLOVATE ) 0.05 % cream Apply topically 2 (two) times daily as needed (Rash). 01/26/21   Sheffield, Kelli R, PA-C  amLODipine  (NORVASC ) 10 MG tablet Take 1 tablet (10 mg total) by mouth daily. 01/20/24   Daryl Setter, NP  aspirin  81 MG chewable tablet Chew 1 tablet (81 mg total) by mouth daily. 07/19/17   Jadine Toribio SQUIBB, MD  Blood Glucose Monitoring Suppl DEVI 1 each by Does not apply route in the morning, at noon,  and at bedtime. May substitute to any manufacturer covered by patient's insurance. 08/16/23   Antonio Cyndee Jamee JONELLE, DO  chlorthalidone  (HYGROTON ) 25 MG tablet Take 1 tablet (25 mg total) by mouth daily. 09/02/23 02/20/24  Dunn, Dayna N, PA-C  ciprofloxacin -dexamethasone  (CIPRODEX ) OTIC suspension 4 drops to right ear twice daily 12/19/23   [provider]  clobetasol  (TEMOVATE ) 0.05 % external solution Apply 1 application topically 2 (two) times daily. 01/26/21   Sheffield, Kelli R, PA-C  doxycycline  (VIBRA -TABS) 100 MG tablet Take 1 tablet (100 mg total) by mouth 2 (two) times daily. Se for flares 02/20/24 04/02/24  Alm Delon SAILOR, DO  DULoxetine  (CYMBALTA ) 60 MG capsule Take 1 capsule (60 mg total) by  mouth daily. 01/10/24   Jeannetta Lonni ORN, MD  Evolocumab  (REPATHA  SURECLICK) 140 MG/ML SOAJ INJECT 140 MG INTO THE SKIN EVERY 14 DAYS 01/20/24   Okey Vina GAILS, MD  FARXIGA  5 MG TABS tablet Take 1 tablet (5 mg total) by mouth daily before breakfast. 01/20/24   O'Sullivan, Melissa, NP  fluticasone  (FLONASE ) 50 MCG/ACT nasal spray Place 2 sprays into both nostrils daily. 01/17/21   Saguier, Dallas, PA-C  furosemide  (LASIX ) 20 MG tablet Take 1 tablet (20 mg total) by mouth daily as needed (for shortness of breath or weight gain of 2 lbs overnight or 5 lbs in a week). 08/22/22   Lucien Orren SAILOR, PA-C  glucose blood (ACCU-CHEK GUIDE TEST) test strip USE 1 EACH BY IN VITRO ROUTE IN [place], AT NOON, AND AT BEDTIME 10/23/23   O'Sullivan, Melissa, NP  isosorbide  mononitrate (IMDUR ) 60 MG 24 hr tablet TAKE 1 & 1/2 (ONE & ONE-HALF) TABLETS BY MOUTH ONCE DAILY 12/10/22   Ross, Paula V, MD  levothyroxine (SYNTHROID) 112 MCG tablet Take by mouth.    [provider]  loratadine  (CLARITIN ) 10 MG tablet Take 1 tablet (10 mg total) by mouth daily. 07/15/20   Geiple, Joshua, PA-C  meclizine  (ANTIVERT ) 12.5 MG tablet Take 1 tablet (12.5 mg total) by mouth 3 (three) times daily as needed for dizziness. 10/01/23   Daryl Setter, NP  meloxicam  (MOBIC ) 15 MG tablet Take by mouth. 12/20/23   [provider]  methimazole  (TAPAZOLE ) 5 MG tablet Take 5 mg daily 10/23/23   Trixie File, MD  methocarbamol  (ROBAXIN ) 500 MG tablet Take 1 tablet (500 mg total) by mouth at bedtime as needed for muscle spasms. 01/10/24   Rice, Lonni ORN, MD  metoCLOPramide  (REGLAN ) 5 MG tablet TAKE 1 TABLET BY MOUTH THREE TIMES DAILY BEFORE MEALS. SCHEDULE WITH GI FOR FUTHER REFILLS 12/21/23   Daryl Setter, NP  Multiple Vitamins-Minerals (MULTIVITAMIN WITH MINERALS) tablet Take 1 tablet by mouth daily. 12/09/23   O'Sullivan, Melissa, NP  ondansetron  (ZOFRAN -ODT) 4 MG disintegrating tablet Take 1 tablet (4 mg total) by  mouth every 8 (eight) hours as needed for nausea or vomiting. 01/20/24   Maizie Garno, Elspeth SQUIBB, MD  pantoprazole  (PROTONIX ) 40 MG tablet Take 1 tablet (40 mg total) by mouth 2 (two) times daily. 01/20/24   Vieno Tarrant, Elspeth SQUIBB, MD  potassium chloride  SA (KLOR-CON  M20) 20 MEQ tablet Take 2 tablets (40 mEq total) by mouth daily. 09/02/23 02/20/24  Dunn, Dayna N, PA-C  rosuvastatin  (CRESTOR ) 40 MG tablet Take 1 tablet (40 mg total) by mouth daily. 01/20/24   O'Sullivan, Melissa, NP  SUMAtriptan  (IMITREX ) 50 MG tablet Take 50 mg by mouth daily as needed for migraine.    [provider]  tretinoin  (RETIN-A ) 0.05 % cream Apply  topically at bedtime. apply pea size (1gm) amount to face 2-3 nights a week. 02/20/24 02/19/25  Alm Delon SAILOR, DO  Vitamin D , Ergocalciferol , (DRISDOL ) 1.25 MG (50000 UNIT) CAPS capsule Take 1 capsule (50,000 Units total) by mouth every 7 (seven) days. 12/09/23   Daryl Setter, NP  XIIDRA 5 % SOLN Apply 1 drop to eye 2 (two) times daily. 06/12/22   [provider]    Current Outpatient Medications  Medication Sig Dispense Refill   Accu-Chek Softclix Lancets lancets 3 (three) times daily.     albuterol  (VENTOLIN  HFA) 108 (90 Base) MCG/ACT inhaler INHALE 2 PUFFS BY MOUTH 4 TIMES DAILY 9 g 0   alclomethasone (ACLOVATE ) 0.05 % cream Apply topically 2 (two) times daily as needed (Rash). 180 g 3   amLODipine  (NORVASC ) 10 MG tablet Take 1 tablet (10 mg total) by mouth daily. 90 tablet 0   aspirin  81 MG chewable tablet Chew 1 tablet (81 mg total) by mouth daily.     Blood Glucose Monitoring Suppl DEVI 1 each by Does not apply route in the morning, at noon, and at bedtime. May substitute to any manufacturer covered by patient's insurance. 1 each 0   chlorthalidone  (HYGROTON ) 25 MG tablet Take 1 tablet (25 mg total) by mouth daily. 90 tablet 3   ciprofloxacin -dexamethasone  (CIPRODEX ) OTIC suspension 4 drops to right ear twice daily     clobetasol  (TEMOVATE ) 0.05 %  external solution Apply 1 application topically 2 (two) times daily. 50 mL 6   doxycycline  (VIBRA -TABS) 100 MG tablet Take 1 tablet (100 mg total) by mouth 2 (two) times daily. Se for flares 14 tablet 5   DULoxetine  (CYMBALTA ) 60 MG capsule Take 1 capsule (60 mg total) by mouth daily. 90 capsule 1   Evolocumab  (REPATHA  SURECLICK) 140 MG/ML SOAJ INJECT 140 MG INTO THE SKIN EVERY 14 DAYS 6 mL 3   FARXIGA  5 MG TABS tablet Take 1 tablet (5 mg total) by mouth daily before breakfast. 90 tablet 0   fluticasone  (FLONASE ) 50 MCG/ACT nasal spray Place 2 sprays into both nostrils daily. 16 g 1   furosemide  (LASIX ) 20 MG tablet Take 1 tablet (20 mg total) by mouth daily as needed (for shortness of breath or weight gain of 2 lbs overnight or 5 lbs in a week). 45 tablet 3   glucose blood (ACCU-CHEK GUIDE TEST) test strip USE 1 EACH BY IN VITRO ROUTE IN [place] MORNING, AT NOON, AND AT BEDTIME 100 each 2   isosorbide  mononitrate (IMDUR ) 60 MG 24 hr tablet TAKE 1 & 1/2 (ONE & ONE-HALF) TABLETS BY MOUTH ONCE DAILY 135 tablet 3   levothyroxine (SYNTHROID) 112 MCG tablet Take by mouth.     loratadine  (CLARITIN ) 10 MG tablet Take 1 tablet (10 mg total) by mouth daily. 10 tablet 0   meclizine  (ANTIVERT ) 12.5 MG tablet Take 1 tablet (12.5 mg total) by mouth 3 (three) times daily as needed for dizziness. 30 tablet 0   meloxicam  (MOBIC ) 15 MG tablet Take by mouth.     methimazole  (TAPAZOLE ) 5 MG tablet Take 5 mg daily 30 tablet 5   methocarbamol  (ROBAXIN ) 500 MG tablet Take 1 tablet (500 mg total) by mouth at bedtime as needed for muscle spasms. 30 tablet 2   metoCLOPramide  (REGLAN ) 5 MG tablet TAKE 1 TABLET BY MOUTH THREE TIMES DAILY BEFORE MEALS. SCHEDULE WITH GI FOR FUTHER REFILLS 90 tablet 0   Multiple Vitamins-Minerals (MULTIVITAMIN WITH MINERALS) tablet Take 1 tablet by mouth daily.  ondansetron  (ZOFRAN -ODT) 4 MG disintegrating tablet Take 1 tablet (4 mg total) by mouth every 8 (eight) hours as needed for nausea or  vomiting. 60 tablet 1   pantoprazole  (PROTONIX ) 40 MG tablet Take 1 tablet (40 mg total) by mouth 2 (two) times daily.     potassium chloride  SA (KLOR-CON  M20) 20 MEQ tablet Take 2 tablets (40 mEq total) by mouth daily. 180 tablet 3   rosuvastatin  (CRESTOR ) 40 MG tablet Take 1 tablet (40 mg total) by mouth daily. 90 tablet 0   SUMAtriptan  (IMITREX ) 50 MG tablet Take 50 mg by mouth daily as needed for migraine.     tretinoin  (RETIN-A ) 0.05 % cream Apply topically at bedtime. apply pea size (1gm) amount to face 2-3 nights a week. 45 g 0   Vitamin D , Ergocalciferol , (DRISDOL ) 1.25 MG (50000 UNIT) CAPS capsule Take 1 capsule (50,000 Units total) by mouth every 7 (seven) days. 12 capsule 0   XIIDRA 5 % SOLN Apply 1 drop to eye 2 (two) times daily.     Current Facility-Administered Medications  Medication Dose Route Frequency Provider Last Rate Last Admin   0.9 %  sodium chloride  infusion  500 mL Intravenous Once Jaeline Whobrey, Elspeth SQUIBB, MD        Allergies as of 02/28/2024 - Review Complete 02/28/2024  Allergen Reaction Noted   Celebrex [celecoxib] Other (See Comments) 05/16/2011   Diovan  [valsartan ] Other (See Comments) 01/09/2013   Metformin  Anxiety 08/16/2023   Ace inhibitors Cough 09/19/2009    Family History  Problem Relation Age of Onset   Hypertension Mother    Rheum arthritis Mother    Hypertension Father    Diabetes Father    Prostate cancer Father    Kidney disease Father    Diabetes Sister    Fibromyalgia Sister    Diabetes Maternal Grandmother    Lung cancer Maternal Grandfather    Arthritis Brother    Migraines Brother    CAD Brother    Fibromyalgia Sister    Migraines Sister    Colon cancer Neg Hx    Thyroid  disease Neg Hx    Colon polyps Neg Hx     Social History   Socioeconomic History   Marital status: Widowed    Spouse name: Not on file   Number of children: 2   Years of education: Not on file   Highest education level: Not on file  Occupational History    Occupation: DISABILITY    Employer: UNEMPLOYED  Tobacco Use   Smoking status: Former    Current packs/day: 0.00    Average packs/day: 0.5 packs/day for 18.0 years (9.0 ttl pk-yrs)    Types: Cigarettes    Start date: 07/29/1977    Quit date: 06/25/1994    Years since quitting: 29.6   Smokeless tobacco: Never   Tobacco comments:    quit 19 years ago  Vaping Use   Vaping status: Never Used  Substance and Sexual Activity   Alcohol use: Yes    Alcohol/week: 0.0 standard drinks of alcohol    Comment: social drinker   Drug use: No   Sexual activity: Not Currently    Birth control/protection: Post-menopausal, Surgical  Other Topics Concern   Not on file  Social History Narrative   Widowed   Has 2 grown children.  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    Former Smoker Quit tobacco in 1996.  She was a pack a day smoker for approximately  10 years.    Alcohol use-yes: Social    Daily Caffeine Use:6 pack of pepsi daily     Illicit Drug Use - no    Patient does not get regular exercise.       Smoking Status:  quit      Right handed   Living alone, 5 steps to up entrance of the house   Retired    Social Drivers of [employer] Strain: Low Risk  (01/16/2023)   Overall Financial Resource Strain (CARDIA)    Difficulty of Paying Living Expenses: Not hard at all  Food Insecurity: No Food Insecurity (01/16/2023)   Hunger Vital Sign    Worried About Running Out of Food in the Last Year: Never true    Ran Out of Food in the Last Year: Never true  Transportation Needs: No Transportation Needs (01/16/2023)   PRAPARE - Administrator, Civil Service (Medical): No    Lack of Transportation (Non-Medical): No  Physical Activity: Inactive (06/10/2023)   Exercise Vital Sign    Days of Exercise per Week: 0 days    Minutes of Exercise per Session: 0 min  Stress: No Stress Concern Present (01/16/2023)   [employer] of  Occupational Health - Occupational Stress Questionnaire    Feeling of Stress : Not at all  Social Connections: Moderately Isolated (06/10/2023)   Social Connection and Isolation Panel    Frequency of Communication with Friends and Family: More than three times a week    Frequency of Social Gatherings with Friends and Family: More than three times a week    Attends Religious Services: More than 4 times per year    Active Member of [employer] or Organizations: No    Attends Banker Meetings: Never    Marital Status: Widowed  Intimate Partner Violence: Not At Risk (06/08/2022)   Humiliation, Afraid, Rape, and Kick questionnaire    Fear of Current or Ex-Partner: No    Emotionally Abused: No    Physically Abused: No    Sexually Abused: No    Review of Systems: All other review of systems negative except as mentioned in the HPI.  Physical Exam: Vital signs BP (!) 144/86   Pulse 64   Temp 98 F (36.7 C) (Temporal)   Ht 5' 4 (1.626 m)   Wt 223 lb (101.2 kg)   SpO2 99%   BMI 38.28 kg/m   General:   Alert,  Well-developed, pleasant and cooperative in NAD Lungs:  Clear throughout to auscultation.   Heart:  Regular rate and rhythm Abdomen:  Soft, nontender and nondistended.   Neuro/Psych:  Alert and cooperative. Normal mood and affect. A and O x 3  Marcey Naval, MD [facility]

## 2024-02-28 NOTE — Op Note (Signed)
 [facility] Patient Name: [name] Procedure Date: 02/28/2024 9:53 AM MRN: [id number] Endoscopist: Elspeth P. Leigh , MD, [id number] Age: 63 Referring MD:  Date of Birth: December 29, 1960 Gender: Female Account #: [id number] Procedure:                Upper GI endoscopy Indications:              Dysphagia, history of gastro-esophageal reflux                            disease - reflux / gastroparesis better controlled,                            now on protonix  40mg  BID and Reglan  PRN. Has had                            empiric dilation in the past with subtle UES                            stricture dilated with benefit. Of note had some                            altered duodenal mucosa on the last exam, biopsies                            nondiagnostic. Medicines:                Monitored Anesthesia Care Procedure:                Pre-Anesthesia Assessment:                           - Prior to the procedure, a History and Physical                            was performed, and patient medications and                            allergies were reviewed. The patient's tolerance of                            previous anesthesia was also reviewed. The risks                            and benefits of the procedure and the sedation                            options and risks were discussed with the patient.                            All questions were answered, and informed consent                            was obtained. Prior Anticoagulants: The patient has  taken no anticoagulant or antiplatelet agents. ASA                            Grade Assessment: III - A patient with severe                            systemic disease. After reviewing the risks and                            benefits, the patient was deemed in satisfactory                            condition to undergo the procedure.                           After obtaining informed consent,  the endoscope was                            passed under direct vision. Throughout the                            procedure, the patient's blood pressure, pulse, and                            oxygen saturations were monitored continuously. The                            Olympus Scope [id number] was introduced through the                            mouth, and advanced to the second part of duodenum.                            The upper GI endoscopy was accomplished without                            difficulty. The patient tolerated the procedure                            well. Scope In: Scope Out: Findings:                 Esophagogastric landmarks were identified: the                            Z-line was found at 34 cm, the gastroesophageal                            junction was found at 34 cm and the upper extent of                            the gastric folds was found at 36 cm from the  incisors.                           A 2 cm hiatal hernia was present.                           A few benign appearing blebs were found in the                            middle third of the esophagus - unchanged from the                            last exam.                           The exam of the esophagus was otherwise normal. No                            focal stenosis / stricture appreciated.                           A guidewire was placed and the scope was withdrawn.                            Empiric dilation was performed in the entire                            esophagus with a Savary dilator with mild                            resistance at 17 mm. Relook endoscopy showed                            appropriate dilation effect just inferior to the                            UES, site of suspected subtle stricture.                           The entire examined stomach was normal.                           Mucosal changes with diffuse bleb like appearance                             was found in the second portion of the duodenum.                            Biopsies were taken with a cold forceps for                            histology.                           The exam of the  duodenum was otherwise normal. Complications:            No immediate complications. Estimated blood loss:                            Minimal. Estimated Blood Loss:     Estimated blood loss was minimal. Impression:               - Esophagogastric landmarks identified.                           - 2 cm hiatal hernia.                           - Benign appearing blebs found in the esophagus -                            similar in appearance to the last exam.                           - Normal esophagus otherwise - empiric dilation                            performed to 17mm with dilation effect noted just                            inferior to the UES.                           - Normal stomach.                           - Duodenal mucosa changes as outlined. Biopsied.                           - Normal duodenum otherwise. Recommendation:           - Patient has a contact number available for                            emergencies. The signs and symptoms of potential                            delayed complications were discussed with the                            patient. Return to normal activities tomorrow.                            Written discharge instructions were provided to the                            patient.                           - Resume previous diet.                           -  Continue present medications.                           - Await pathology results and course post dilation Saim Almanza P. Lynn Recendiz, MD 02/28/2024 10:26:20 AM This report has been signed electronically.

## 2024-02-28 NOTE — Progress Notes (Signed)
 Updated medical record with pt  ED visit 8/15 for ETOH intoxication

## 2024-02-28 NOTE — Progress Notes (Signed)
 Sedate, gd SR, tolerated procedure well, VSS, report to RN

## 2024-02-28 NOTE — Patient Instructions (Signed)
 -Handout on hiatal hernia  provided. -await pathology results. Continue present medications.  YOU HAD AN ENDOSCOPIC PROCEDURE TODAY AT THE [facility]:   Refer to the procedure report that was given to you for any specific questions about what was found during the examination.  If the procedure report does not answer your questions, please call your gastroenterologist to clarify.  If you requested that your care partner not be given the details of your procedure findings, then the procedure report has been included in a sealed envelope for you to review at your convenience later.  YOU SHOULD EXPECT: Some feelings of bloating in the abdomen. Passage of more gas than usual.  Walking can help get rid of the air that was put into your GI tract during the procedure and reduce the bloating. If you had a lower endoscopy (such as a colonoscopy or flexible sigmoidoscopy) you may notice spotting of blood in your stool or on the toilet paper. If you underwent a bowel prep for your procedure, you may not have a normal bowel movement for a few days.  Please Note:  You might notice some irritation and congestion in your nose or some drainage.  This is from the oxygen used during your procedure.  There is no need for concern and it should clear up in a day or so.  SYMPTOMS TO REPORT IMMEDIATELY:  Following upper endoscopy (EGD)  Vomiting of blood or coffee ground material  New chest pain or pain under the shoulder blades  Painful or persistently difficult swallowing  New shortness of breath  Fever of 100F or higher  Black, tarry-looking stools  For urgent or emergent issues, a gastroenterologist can be reached at any hour by calling [phone]. Do not use MyChart messaging for urgent concerns.    DIET:  We do recommend a small meal at first, but then you may proceed to your regular diet.  Drink plenty of fluids but you should avoid alcoholic beverages for 24 hours.  ACTIVITY:  You should  plan to take it easy for the rest of today and you should NOT DRIVE or use heavy machinery until tomorrow (because of the sedation medicines used during the test).    FOLLOW UP: Our staff will call the number listed on your records the next business day following your procedure.  We will call around 7:15- 8:00 am to check on you and address any questions or concerns that you may have regarding the information given to you following your procedure. If we do not reach you, we will leave a message.     If any biopsies were taken you will be contacted by phone or by letter within the next 1-3 weeks.  Please call us  at [phone] if you have not heard about the biopsies in 3 weeks.    SIGNATURES/CONFIDENTIALITY: You and/or your care partner have signed paperwork which will be entered into your electronic medical record.  These signatures attest to the fact that that the information above on your After Visit Summary has been reviewed and is understood.  Full responsibility of the confidentiality of this discharge information lies with you and/or your care-partner.  Hiatal Hernia  A hiatal hernia occurs when part of the stomach slides above the muscle that separates the abdomen from the chest (diaphragm). A person can be born with a hiatal hernia (congenital), or it may develop over time. In almost all cases of hiatal hernia, only the top part of the stomach pushes through  the diaphragm. Many people have a hiatal hernia with no symptoms. The larger the hernia, the more likely it is that you will have symptoms. In some cases, a hiatal hernia allows stomach acid to flow back into the tube that carries food from your mouth to your stomach (esophagus). This may cause heartburn symptoms. The development of heartburn symptoms may mean that you have a condition called gastroesophageal reflux disease (GERD). What are the causes? This condition is caused by a weakness in the opening (hiatus) where the esophagus  passes through the diaphragm to attach to the upper part of the stomach. A person may be born with a weakness in the hiatus, or a weakness can develop over time. What increases the risk? This condition is more likely to develop in: Older people. Age is a major risk factor for a hiatal hernia, especially if you are over the age of 45. Pregnant women. People who are overweight. People who have frequent constipation. What are the signs or symptoms? Symptoms of this condition usually develop in the form of GERD symptoms. Symptoms include: Heartburn. Upset stomach (indigestion). Trouble swallowing. Coughing or wheezing. Wheezing is making high-pitched whistling sounds when you breathe. Sore throat. Chest pain. Nausea and vomiting. How is this diagnosed? This condition may be diagnosed during testing for GERD. Tests that may be done include: X-rays of your stomach or chest. An upper gastrointestinal (GI) series. This is an X-ray exam of your GI tract that is taken after you swallow a chalky liquid that shows up clearly on the X-ray. Endoscopy. This is a procedure to look into your stomach using a thin, flexible tube that has a tiny camera and light on the end of it. How is this treated? This condition may be treated by: Dietary and lifestyle changes to help reduce GERD symptoms. Medicines. These may include: Over-the-counter antacids. Medicines that make your stomach empty more quickly. Medicines that block the production of stomach acid (H2 blockers). Stronger medicines to reduce stomach acid (proton pump inhibitors). Surgery to repair the hernia, if other treatments are not helping. If you have no symptoms, you may not need treatment. Follow these instructions at home: Lifestyle and activity Do not use any products that contain nicotine or tobacco. These products include cigarettes, chewing tobacco, and vaping devices, such as e-cigarettes. If you need help quitting, ask your health care  provider. Try to achieve and maintain a healthy body weight. Avoid putting pressure on your abdomen. Anything that puts pressure on your abdomen increases the amount of acid that may be pushed up into your esophagus. Avoid bending over, especially after eating. Raise the head of your bed by putting blocks under the legs. This keeps your head and esophagus higher than your stomach. Do not wear tight clothing around your chest or stomach. Try not to strain when having a bowel movement, when urinating, or when lifting heavy objects. Eating and drinking Avoid foods that can worsen GERD symptoms. These may include: Fatty foods, like fried foods. Citrus fruits, like oranges or lemon. Other foods and drinks that contain acid, like orange juice or tomatoes. Spicy food. Chocolate. Eat frequent small meals instead of three large meals a day. This helps prevent your stomach from getting too full. Eat slowly. Do not lie down right after eating. Do not eat 1-2 hours before bed. Do not drink beverages with caffeine. These include cola, coffee, cocoa, and tea. Do not drink alcohol. General instructions Take over-the-counter and prescription medicines only as told by your  health care provider. Keep all follow-up visits. Your health care provider will want to check that any new prescribed medicines are helping your symptoms. Contact a health care provider if: Your symptoms are not controlled with medicines or lifestyle changes. You are having trouble swallowing. You have coughing or wheezing that will not go away. Your pain is getting worse. Your pain spreads to your arms, neck, jaw, teeth, or back. You feel nauseous or you vomit. Get help right away if: You have shortness of breath. You vomit blood. You have bright red blood in your stools. You have black, tarry stools. These symptoms may be an emergency. Get help right away. Call 911. Do not wait to see if the symptoms will go away. Do not  drive yourself to the hospital. Summary A hiatal hernia occurs when part of the stomach slides above the muscle that separates the abdomen from the chest. A person may be born with a weakness in the hiatus, or a weakness can develop over time. Symptoms of a hiatal hernia may include heartburn, trouble swallowing, or sore throat. Management of a hiatal hernia includes eating frequent small meals instead of three large meals a day. Get help right away if you vomit blood, have bright red blood in your stools, or have black, tarry stools. This information is not intended to replace advice given to you by your health care provider. Make sure you discuss any questions you have with your health care provider. Document Revised: 08/08/2021 Document Reviewed: 08/08/2021 Elsevier Patient Education  2024 [employer].

## 2024-03-02 ENCOUNTER — Telehealth: Payer: Self-pay | Admitting: *Deleted

## 2024-03-02 NOTE — Telephone Encounter (Signed)
  Follow up Call-     02/28/2024    9:42 AM 03/30/2022    2:32 PM  Call back number  Post procedure Call Back phone  # [phone]  Permission to leave phone message Yes Yes     Patient questions:  Do you have a fever, pain , or abdominal swelling? No. Pain Score  0 *  Have you tolerated food without any problems? Yes.    Have you been able to return to your normal activities? Yes.    Do you have any questions about your discharge instructions: Diet   No. Medications  No. Follow up visit  No.  Do you have questions or concerns about your Care? No.  Actions: * If pain score is 4 or above: No action needed, pain <4.

## 2024-03-03 ENCOUNTER — Ambulatory Visit: Payer: Self-pay | Admitting: Neurology

## 2024-03-03 LAB — SURGICAL PATHOLOGY

## 2024-03-03 NOTE — Progress Notes (Signed)
 I left message to call office to discuss MRI results

## 2024-03-04 ENCOUNTER — Telehealth: Payer: Self-pay | Admitting: Neurology

## 2024-03-04 ENCOUNTER — Ambulatory Visit: Payer: Self-pay | Admitting: Gastroenterology

## 2024-03-04 NOTE — Telephone Encounter (Signed)
 Notified pt regarding MRI results and need PCP follow-up regarding B/p.

## 2024-03-04 NOTE — Telephone Encounter (Signed)
 Pt is returning call. Please call back.  Thanks!

## 2024-03-13 ENCOUNTER — Telehealth: Payer: Self-pay | Admitting: Family

## 2024-03-13 ENCOUNTER — Encounter: Payer: Self-pay | Admitting: Family

## 2024-03-13 ENCOUNTER — Ambulatory Visit: Admitting: Family

## 2024-03-13 VITALS — BP 135/73 | HR 69 | Ht 64.0 in | Wt 220.2 lb

## 2024-03-13 DIAGNOSIS — D500 Iron deficiency anemia secondary to blood loss (chronic): Secondary | ICD-10-CM | POA: Diagnosis not present

## 2024-03-13 DIAGNOSIS — G43809 Other migraine, not intractable, without status migrainosus: Secondary | ICD-10-CM

## 2024-03-13 DIAGNOSIS — E1165 Type 2 diabetes mellitus with hyperglycemia: Secondary | ICD-10-CM | POA: Diagnosis not present

## 2024-03-13 DIAGNOSIS — R252 Cramp and spasm: Secondary | ICD-10-CM | POA: Diagnosis not present

## 2024-03-13 DIAGNOSIS — E0590 Thyrotoxicosis, unspecified without thyrotoxic crisis or storm: Secondary | ICD-10-CM

## 2024-03-13 DIAGNOSIS — I25118 Atherosclerotic heart disease of native coronary artery with other forms of angina pectoris: Secondary | ICD-10-CM

## 2024-03-13 DIAGNOSIS — E1159 Type 2 diabetes mellitus with other circulatory complications: Secondary | ICD-10-CM

## 2024-03-13 DIAGNOSIS — E559 Vitamin D deficiency, unspecified: Secondary | ICD-10-CM | POA: Diagnosis not present

## 2024-03-13 DIAGNOSIS — Z7984 Long term (current) use of oral hypoglycemic drugs: Secondary | ICD-10-CM

## 2024-03-13 DIAGNOSIS — M1990 Unspecified osteoarthritis, unspecified site: Secondary | ICD-10-CM

## 2024-03-13 DIAGNOSIS — G4733 Obstructive sleep apnea (adult) (pediatric): Secondary | ICD-10-CM | POA: Diagnosis not present

## 2024-03-13 DIAGNOSIS — I10 Essential (primary) hypertension: Secondary | ICD-10-CM | POA: Diagnosis not present

## 2024-03-13 DIAGNOSIS — I152 Hypertension secondary to endocrine disorders: Secondary | ICD-10-CM

## 2024-03-13 DIAGNOSIS — M5416 Radiculopathy, lumbar region: Secondary | ICD-10-CM

## 2024-03-13 DIAGNOSIS — K3184 Gastroparesis: Secondary | ICD-10-CM

## 2024-03-13 LAB — MAGNESIUM: Magnesium: 1.8 mg/dL (ref 1.5–2.5)

## 2024-03-13 LAB — MICROALBUMIN / CREATININE URINE RATIO
Creatinine,U: 98.5 mg/dL
Microalb Creat Ratio: 9.2 mg/g (ref 0.0–30.0)
Microalb, Ur: 0.9 mg/dL (ref 0.0–1.9)

## 2024-03-13 LAB — BASIC METABOLIC PANEL WITH GFR
BUN: 17 mg/dL (ref 6–23)
CO2: 26 meq/L (ref 19–32)
Calcium: 9.4 mg/dL (ref 8.4–10.5)
Chloride: 103 meq/L (ref 96–112)
Creatinine, Ser: 0.78 mg/dL (ref 0.40–1.20)
GFR: 79.66 mL/min (ref >60.00)
Glucose, Bld: 140 mg/dL — ABNORMAL HIGH (ref 70–99)
Potassium: 3.8 meq/L (ref 3.5–5.1)
Sodium: 138 meq/L (ref 135–145)

## 2024-03-13 LAB — IBC + FERRITIN
Ferritin: 853.5 ng/mL — ABNORMAL HIGH (ref 10.0–291.0)
Iron: 67 ug/dL (ref 42–145)
Saturation Ratios: 24.5 % (ref 20.0–50.0)
TIBC: 273 ug/dL (ref 250.0–450.0)
Transferrin: 195 mg/dL — ABNORMAL LOW (ref 212.0–360.0)

## 2024-03-13 LAB — HEMOGLOBIN A1C: Hgb A1c MFr Bld: 6.9 % — ABNORMAL HIGH (ref 4.6–6.5)

## 2024-03-13 MED ORDER — AMLODIPINE BESYLATE 10 MG PO TABS: 10.0000 mg | ORAL_TABLET | Freq: Every day | ORAL | 1 refills | Status: AC

## 2024-03-13 MED ORDER — MELOXICAM 7.5 MG PO TABS: 7.5000 mg | ORAL_TABLET | Freq: Every day | ORAL | 5 refills | Status: DC

## 2024-03-13 MED ORDER — VITAMIN D (ERGOCALCIFEROL) 1.25 MG (50000 UNIT) PO CAPS: 50000.0000 [IU] | ORAL_CAPSULE | ORAL | 0 refills | Status: AC

## 2024-03-13 MED ORDER — FARXIGA 5 MG PO TABS: 5.0000 mg | ORAL_TABLET | Freq: Every day | ORAL | 1 refills | Status: DC

## 2024-03-13 NOTE — Telephone Encounter (Signed)
 Request faxed for diabetic eye exam and immunization query done for records.

## 2024-03-13 NOTE — Telephone Encounter (Signed)
 Please call Optometry at [facility] on [address] to request DM eye exam.

## 2024-03-13 NOTE — Assessment & Plan Note (Signed)
 Reports that she had ESI yesterday with Dr. Colon.

## 2024-03-13 NOTE — Assessment & Plan Note (Signed)
 No recent chest pain, maintained on imdur  and aspirin .

## 2024-03-13 NOTE — Patient Instructions (Signed)
 VISIT SUMMARY:  You had a follow-up visit to review your medications and lab results. Your blood sugar levels are generally well-controlled, but your HbA1c is slightly above target. We discussed your leg cramps, sleep apnea, ear symptoms, and chronic pain management.  YOUR PLAN:  TYPE 2 DIABETES MELLITUS: Your HbA1c is at 7.3%, which is slightly above the target. Your blood glucose levels are generally well-controlled. -We will check your HbA1c today. -Continue taking Farxiga . -We will resend your high-dose vitamin D  prescription to [facility] on [address].  IRON  DEFICIENCY ANEMIA: You have iron  deficiency anemia managed with infusions, but you haven't had one recently. -We will check your iron  levels today. -If your iron  levels are low, we will forward the results to the infusion team.  OBSTRUCTIVE SLEEP APNEA: You have sleep apnea managed with a BiPAP machine, but you find it uncomfortable and it shuts off at pressure level 4. -Continue using your BiPAP machine for cardiovascular benefits. -We will reprogram your BiPAP machine today.  OSTEOARTHRITIS AND CHRONIC BACK PAIN: You have chronic pain managed with meloxicam  and Cymbalta , and you recently received a lower back injection. -We will refill your meloxicam  prescription for arthritis pain.  CHRONIC RIGHT EAR SYMPTOMS WITH TYMPANOSTOMY TUBE: You have ongoing ear symptoms but no infection. You are using ear drops and following with ENT. -Continue using ear drops as needed.  LEG CRAMPS: You have been experiencing leg cramps recently. -We will check your magnesium  and calcium  levels.  VITAMIN D  DEFICIENCY: You have a history of vitamin D  deficiency and have not taken high-dose vitamin D  recently. -We will resend your high-dose vitamin D  prescription to [facility] on [address].

## 2024-03-13 NOTE — Assessment & Plan Note (Signed)
 Lab Results  Component Value Date   WBC 8.8 02/08/2024   HGB 12.2 02/08/2024   HCT 41.1 02/08/2024   MCV 73.8 (L) 02/08/2024   PLT 255 02/08/2024   She is following with Hematology.  Wishes to update iron  level.

## 2024-03-13 NOTE — Assessment & Plan Note (Signed)
 Unchanged- management per GI. Had esophageal dilation on 02/28/24.

## 2024-03-13 NOTE — Assessment & Plan Note (Signed)
 Lab Results  Component Value Date   CHOL 137 08/09/2023   HDL 41.80 08/09/2023   LDLCALC 67 08/09/2023   LDLDIRECT 76 07/21/2019   TRIG 144.0 08/09/2023   CHOLHDL 3 08/09/2023   On Repatha  and crestor , LDL at goal.

## 2024-03-13 NOTE — Assessment & Plan Note (Signed)
 Lab Results  Component Value Date   TSH 0.97 12/20/2023   Clinically stable on tapazole . Followed by Dr. Trixie.

## 2024-03-13 NOTE — Assessment & Plan Note (Signed)
No recent migraines.  

## 2024-03-13 NOTE — Assessment & Plan Note (Signed)
 Never started vit D. Will start 12 week course today and repeat next visit.

## 2024-03-13 NOTE — Assessment & Plan Note (Signed)
 Continues bipap- follows with Dr. Shlomo cardiology for OSA.

## 2024-03-13 NOTE — Telephone Encounter (Signed)
 Also, please request immunizations from [facility] on [address] pharmacy.

## 2024-03-13 NOTE — Progress Notes (Signed)
 Subjective:     Patient ID: [name], female    DOB: 10-03-1960, 63 y.o.   MRN: [id number]  Chief Complaint  Patient presents with   Follow-up    HPI  Discussed the use of AI scribe software for clinical note transcription with the patient, who gave verbal consent to proceed.  History of Present Illness  [name] is a 63 year old female who presents for a follow-up on medications and labs.  Her hemoglobin A1c in June was 7.3. She takes Farxiga  for diabetes management and previously discontinued metformin  due to diarrhea. Home blood glucose readings range from 97 to 120 mg/dL, not exceeding 799 mg/dL.  She experiences leg cramping and is concerned about calcium  and iron  levels. She receives iron  infusions but has not had one recently. She has sleep apnea and uses a BiPAP machine, which she finds uncomfortable. She uses melatonin or PMs for sleep.  She has ongoing issues with her right ear despite having a tube placed and uses ear drops. Her hearing is not significantly decreased.  She recently received a lower back injection for pain management. She takes meloxicam  for arthritis pain and Cymbalta  for fibromyalgia pain. She follows with endocrinology for thyroid  management and takes metoprazole. She takes Crestor  and Repatha  for cholesterol management, and amlodipine  for blood pressure, which was 135/73 mmHg at the visit.  She has gastroparesis with symptoms of nausea and esophageal discomfort and takes metoclopramide . She takes aspirin  for heart-related issues.     Health Maintenance Due  Topic Date Due   OPHTHALMOLOGY EXAM  Never done   Hepatitis B Vaccines 19-59 Average Risk (2 of 3 - 19+ 3-dose series) 01/04/2024   COVID-19 Vaccine (5 - 2025-26 season) 02/24/2024    Past Medical History:  Diagnosis Date   Allergy    allergic rhinitis   Arthritis    Atypical chest pain    Constipation    Cyst, ovarian    Diabetes (HCC)    Diabetes mellitus, type II  (HCC)    Elevated alkaline phosphatase level    Endometriosis    Esophageal stricture    Fatty liver    Gastroparesis    GERD (gastroesophageal reflux disease)    Heart murmur    Hepatic hemangioma    Hyperlipidemia    Hypertension    Hypertrophic condition of skin    acrokeratoelastoidosis- s/p 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 1/08- benign   Iron  deficiency anemia    Migraine headache    Non-compliance    Peptic ulcer disease    Sleep apnea    Thyroid  disease     Past Surgical History:  Procedure Laterality Date   ABDOMINAL EXPLORATION SURGERY     w/bso    BREAST BIOPSY     CARDIAC CATHETERIZATION  2009   mild non obstructive CAD   CHOLECYSTECTOMY     COLONOSCOPY     ESOPHAGEAL MANOMETRY N/A 08/01/2020   Procedure: ESOPHAGEAL MANOMETRY (EM);  Surgeon: Leigh Elspeth SQUIBB, MD;  Location: [facility];  Service: Gastroenterology;  Laterality: N/A;   KNEE SURGERY  2005    left knee   LEFT HEART CATH AND CORONARY ANGIOGRAPHY N/A 07/17/2017   Procedure: LEFT HEART CATH AND CORONARY ANGIOGRAPHY;  Surgeon: [place]azil[place], Peter M, MD;  Location: [facility] INVASIVE CV LAB;  Service: Cardiovascular;  Laterality: N/A;   LEFT HEART CATH AND CORONARY ANGIOGRAPHY N/A 09/01/2021   Procedure: LEFT HEART CATH AND CORONARY ANGIOGRAPHY;  Surgeon: Verlin Lonni BIRCH, MD;  Location: [facility] INVASIVE CV  LAB;  Service: Cardiovascular;  Laterality: N/A;   POLYPECTOMY     TOTAL ABDOMINAL HYSTERECTOMY     UPPER GASTROINTESTINAL ENDOSCOPY      Family History  Problem Relation Age of Onset   Hypertension Mother    Rheum arthritis Mother    Hypertension Father    Diabetes Father    Prostate cancer Father    Kidney disease Father    Diabetes Sister    Fibromyalgia Sister    Diabetes Maternal Grandmother    Lung cancer Maternal Grandfather    Arthritis Brother    Migraines Brother    CAD Brother    Fibromyalgia Sister    Migraines Sister    Colon cancer Neg Hx    Thyroid  disease Neg Hx    Colon polyps Neg Hx      Social History   Socioeconomic History   Marital status: Widowed    Spouse name: Not on file   Number of children: 2   Years of education: Not on file   Highest education level: Not on file  Occupational History   Occupation: DISABILITY    Employer: UNEMPLOYED  Tobacco Use   Smoking status: Former    Current packs/day: 0.00    Average packs/day: 0.5 packs/day for 18.0 years (9.0 ttl pk-yrs)    Types: Cigarettes    Start date: 07/29/1977    Quit date: 06/25/1994    Years since quitting: 29.7   Smokeless tobacco: Never   Tobacco comments:    quit 19 years ago  Vaping Use   Vaping status: Never Used  Substance and Sexual Activity   Alcohol use: Yes    Alcohol/week: 0.0 standard drinks of alcohol    Comment: social drinker   Drug use: No   Sexual activity: Not Currently    Birth control/protection: Post-menopausal, Surgical  Other Topics Concern   Not on file  Social History Narrative   Widowed   Has 2 grown children.  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    Former Smoker Quit tobacco in 1996.  She was a pack a day smoker for approximately 10 years.    Alcohol use-yes: Social    Daily Caffeine Use:6 pack of pepsi daily     Illicit Drug Use - no    Patient does not get regular exercise.       Smoking Status:  quit      Right handed   Living alone, 5 steps to up entrance of the house   Retired    Social Drivers of [employer] Strain: Low Risk  (01/16/2023)   Overall Financial Resource Strain (CARDIA)    Difficulty of Paying Living Expenses: Not hard at all  Food Insecurity: No Food Insecurity (01/16/2023)   Hunger Vital Sign    Worried About Running Out of Food in the Last Year: Never true    Ran Out of Food in the Last Year: Never true  Transportation Needs: No Transportation Needs (01/16/2023)   PRAPARE - Administrator, Civil Service (Medical): No    Lack of Transportation (Non-Medical): No   Physical Activity: Inactive (06/10/2023)   Exercise Vital Sign    Days of Exercise per Week: 0 days    Minutes of Exercise per Session: 0 min  Stress: No Stress Concern Present (01/16/2023)   [employer] of Occupational Health - Occupational Stress Questionnaire    Feeling of Stress : Not at all  Social Connections: Moderately Isolated (06/10/2023)   Social Connection and Isolation Panel    Frequency of Communication with Friends and Family: More than three times a week    Frequency of Social Gatherings with Friends and Family: More than three times a week    Attends Religious Services: More than 4 times per year    Active Member of [employer] or Organizations: No    Attends Banker Meetings: Never    Marital Status: Widowed  Intimate Partner Violence: Not At Risk (06/08/2022)   Humiliation, Afraid, Rape, and Kick questionnaire    Fear of Current or Ex-Partner: No    Emotionally Abused: No    Physically Abused: No    Sexually Abused: No    Outpatient Medications Prior to Visit  Medication Sig Dispense Refill   Accu-Chek Softclix Lancets lancets 3 (three) times daily.     albuterol  (VENTOLIN  HFA) 108 (90 Base) MCG/ACT inhaler INHALE 2 PUFFS BY MOUTH 4 TIMES DAILY 9 g 0   alclomethasone (ACLOVATE ) 0.05 % cream Apply topically 2 (two) times daily as needed (Rash). 180 g 3   aspirin  81 MG chewable tablet Chew 1 tablet (81 mg total) by mouth daily.     Blood Glucose Monitoring Suppl DEVI 1 each by Does not apply route in the morning, at noon, and at bedtime. May substitute to any manufacturer covered by patient's insurance. 1 each 0   chlorthalidone  (HYGROTON ) 25 MG tablet Take 1 tablet (25 mg total) by mouth daily. 90 tablet 3   ciprofloxacin -dexamethasone  (CIPRODEX ) OTIC suspension 4 drops to right ear twice daily     clobetasol  (TEMOVATE ) 0.05 % external solution Apply 1 application topically 2 (two) times daily. 50 mL 6   doxycycline  (VIBRA -TABS) 100 MG tablet Take 1  tablet (100 mg total) by mouth 2 (two) times daily. Se for flares 14 tablet 5   DULoxetine  (CYMBALTA ) 60 MG capsule Take 1 capsule (60 mg total) by mouth daily. 90 capsule 1   Evolocumab  (REPATHA  SURECLICK) 140 MG/ML SOAJ INJECT 140 MG INTO THE SKIN EVERY 14 DAYS 6 mL 3   fluticasone  (FLONASE ) 50 MCG/ACT nasal spray Place 2 sprays into both nostrils daily. 16 g 1   furosemide  (LASIX ) 20 MG tablet Take 1 tablet (20 mg total) by mouth daily as needed (for shortness of breath or weight gain of 2 lbs overnight or 5 lbs in a week). 45 tablet 3   glucose blood (ACCU-CHEK GUIDE TEST) test strip USE 1 EACH BY IN VITRO ROUTE IN ETH MORNING, AT NOON, AND AT BEDTIME 100 each 2   isosorbide  mononitrate (IMDUR ) 60 MG 24 hr tablet TAKE 1 & 1/2 (ONE & ONE-HALF) TABLETS BY MOUTH ONCE DAILY 135 tablet 3   loratadine  (CLARITIN ) 10 MG tablet Take 1 tablet (10 mg total) by mouth daily. 10 tablet 0   meclizine  (ANTIVERT ) 12.5 MG tablet Take 1 tablet (12.5 mg total) by mouth 3 (three) times daily as needed for dizziness. 30 tablet 0   methimazole  (TAPAZOLE ) 5 MG tablet Take 5 mg daily 30 tablet 5   methocarbamol  (ROBAXIN ) 500 MG tablet Take 1 tablet (500 mg total) by mouth at bedtime as needed for muscle spasms. 30 tablet 2   metoCLOPramide  (REGLAN ) 5 MG tablet TAKE 1 TABLET BY MOUTH THREE TIMES DAILY BEFORE MEALS. SCHEDULE WITH GI FOR FUTHER REFILLS 90 tablet 0   Multiple Vitamins-Minerals (MULTIVITAMIN WITH MINERALS) tablet Take 1 tablet by mouth daily.     ondansetron  (ZOFRAN -ODT) 4 MG  disintegrating tablet Take 1 tablet (4 mg total) by mouth every 8 (eight) hours as needed for nausea or vomiting. 60 tablet 1   pantoprazole  (PROTONIX ) 40 MG tablet Take 1 tablet (40 mg total) by mouth 2 (two) times daily.     potassium chloride  SA (KLOR-CON  M20) 20 MEQ tablet Take 2 tablets (40 mEq total) by mouth daily. 180 tablet 3   rosuvastatin  (CRESTOR ) 40 MG tablet Take 1 tablet (40 mg total) by mouth daily. 90 tablet 0    SUMAtriptan  (IMITREX ) 50 MG tablet Take 50 mg by mouth daily as needed for migraine.     tretinoin  (RETIN-A ) 0.05 % cream Apply topically at bedtime. apply pea size (1gm) amount to face 2-3 nights a week. 45 g 0   XIIDRA 5 % SOLN Apply 1 drop to eye 2 (two) times daily.     amLODipine  (NORVASC ) 10 MG tablet Take 1 tablet (10 mg total) by mouth daily. 90 tablet 0   FARXIGA  5 MG TABS tablet Take 1 tablet (5 mg total) by mouth daily before breakfast. 90 tablet 0   levothyroxine (SYNTHROID) 112 MCG tablet Take by mouth.     meloxicam  (MOBIC ) 15 MG tablet Take by mouth.     Vitamin D , Ergocalciferol , (DRISDOL ) 1.25 MG (50000 UNIT) CAPS capsule Take 1 capsule (50,000 Units total) by mouth every 7 (seven) days. 12 capsule 0   No facility-administered medications prior to visit.    Allergies  Allergen Reactions   Celebrex [Celecoxib] Other (See Comments)    makes me bleed   Diovan  [Valsartan ] Other (See Comments)    angioedema   Metformin  Anxiety    Vomiting, Anxiety, Cramping, Diarrhea   Ace Inhibitors Cough    ROS See HPI    Objective:    Physical Exam Constitutional:      General: She is not in acute distress.    Appearance: Normal appearance. She is well-developed.  HENT:     Head: Normocephalic and atraumatic.     Right Ear: External ear normal. A PE tube is present.     Left Ear: Tympanic membrane, ear canal and external ear normal.     Ears:     Comments: R TM is opacified, no erythema Eyes:     General: No scleral icterus. Neck:     Thyroid : No thyromegaly.  Cardiovascular:     Rate and Rhythm: Normal rate and regular rhythm.     Heart sounds: Normal heart sounds. No murmur heard. Pulmonary:     Effort: Pulmonary effort is normal. No respiratory distress.     Breath sounds: Normal breath sounds. No wheezing.  Musculoskeletal:     Cervical back: Neck supple.  Skin:    General: Skin is warm and dry.  Neurological:     Mental Status: She is alert and oriented to  person, place, and time.  Psychiatric:        Mood and Affect: Mood normal.        Behavior: Behavior normal.        Thought Content: Thought content normal.        Judgment: Judgment normal.      BP 135/73 (BP Location: Right Arm, Patient Position: Sitting, Cuff Size: Large)   Pulse 69   Ht 5' 4 (1.626 m)   Wt 220 lb 3.2 oz (99.9 kg)   SpO2 100%   BMI 37.80 kg/m  Wt Readings from Last 3 Encounters:  03/13/24 220 lb 3.2 oz (99.9 kg)  02/28/24  223 lb (101.2 kg)  02/07/24 223 lb (101.2 kg)       Assessment & Plan:   Problem List Items Addressed This Visit       Unprioritized   Vitamin D  deficiency   Never started vit D. Will start 12 week course today and repeat next visit.       Relevant Medications   Vitamin D , Ergocalciferol , (DRISDOL ) 1.25 MG (50000 UNIT) CAPS capsule   Type 2 diabetes mellitus with hyperglycemia, without long-term current use of insulin  (HCC) - Primary   Lab Results  Component Value Date   HGBA1C 7.3 (H) 12/06/2023   HGBA1C 7.1 (H) 08/09/2023   HGBA1C 6.2 02/03/2022   Lab Results  Component Value Date   MICROALBUR 3.6 (H) 08/09/2023   LDLCALC 67 08/09/2023   CREATININE 0.85 02/08/2024   GI symptoms improved off of metformin , continues farxiga , slightly above goal last A1C. Update today.       Relevant Medications   FARXIGA  5 MG TABS tablet   Other Relevant Orders   HgB A1c   Urine Microalbumin w/creat. ratio   OSA (obstructive sleep apnea)   Continues bipap- follows with Dr. Shlomo cardiology for OSA.       Obesity, morbid (HCC)   Relevant Medications   FARXIGA  5 MG TABS tablet   Migraine headache   No recent migraines.       Relevant Medications   meloxicam  (MOBIC ) 7.5 MG tablet   amLODipine  (NORVASC ) 10 MG tablet   Lumbar radiculopathy   Reports that she had ESI yesterday with Dr. Colon.        Iron  deficiency anemia   Lab Results  Component Value Date   WBC 8.8 02/08/2024   HGB 12.2 02/08/2024   HCT 41.1  02/08/2024   MCV 73.8 (L) 02/08/2024   PLT 255 02/08/2024   She is following with Hematology.  Wishes to update iron  level.       Relevant Orders   IBC + Ferritin   Hyperthyroidism   Lab Results  Component Value Date   TSH 0.97 12/20/2023   Clinically stable on tapazole . Followed by Dr. Trixie.        Hypertension associated with diabetes (HCC)   BP Readings from Last 3 Encounters:  03/13/24 135/73  02/28/24 (!) 146/82  02/20/24 131/85         Relevant Medications   amLODipine  (NORVASC ) 10 MG tablet   FARXIGA  5 MG TABS tablet   Gastroparesis   Unchanged- management per GI. Had esophageal dilation on 02/28/24.        Coronary artery disease of native heart with stable angina pectoris (HCC)   No recent chest pain, maintained on imdur  and aspirin .       Relevant Medications   meloxicam  (MOBIC ) 7.5 MG tablet   amLODipine  (NORVASC ) 10 MG tablet   Arthritis   Relevant Medications   meloxicam  (MOBIC ) 7.5 MG tablet   Other Visit Diagnoses       Leg cramping       Relevant Orders   Magnesium        I have discontinued Wai G. Bobeck's levothyroxine and meloxicam . I am also having her start on meloxicam . Additionally, I am having her maintain her aspirin , loratadine , fluticasone , alclomethasone, clobetasol , albuterol , Xiidra, furosemide , isosorbide  mononitrate, Blood Glucose Monitoring Suppl, Accu-Chek Softclix Lancets, chlorthalidone , potassium chloride  SA, meclizine , SUMAtriptan , Accu-Chek Guide Test, methimazole , multivitamin with minerals, metoCLOPramide , ciprofloxacin -dexamethasone , DULoxetine , methocarbamol , rosuvastatin , Repatha  SureClick, pantoprazole , ondansetron , tretinoin , doxycycline , Vitamin D  (Ergocalciferol ), amLODipine ,  and Farxiga .  Meds ordered this encounter  Medications   Vitamin D , Ergocalciferol , (DRISDOL ) 1.25 MG (50000 UNIT) CAPS capsule    Sig: Take 1 capsule (50,000 Units total) by mouth every 7 (seven) days.    Dispense:  12 capsule     Refill:  0    Supervising Provider:   DOMENICA BLACKBIRD A [[id number]]   meloxicam  (MOBIC ) 7.5 MG tablet    Sig: Take 1 tablet (7.5 mg total) by mouth daily.    Dispense:  30 tablet    Refill:  5    Supervising Provider:   DOMENICA BLACKBIRD A [[id number]]   amLODipine  (NORVASC ) 10 MG tablet    Sig: Take 1 tablet (10 mg total) by mouth daily.    Dispense:  90 tablet    Refill:  1    Supervising Provider:   DOMENICA BLACKBIRD A [[id number]]   FARXIGA  5 MG TABS tablet    Sig: Take 1 tablet (5 mg total) by mouth daily before breakfast.    Dispense:  90 tablet    Refill:  1    Supervising Provider:   DOMENICA BLACKBIRD A [[id number]]

## 2024-03-13 NOTE — Assessment & Plan Note (Addendum)
 Lab Results  Component Value Date   HGBA1C 7.3 (H) 12/06/2023   HGBA1C 7.1 (H) 08/09/2023   HGBA1C 6.2 02/03/2022   Lab Results  Component Value Date   MICROALBUR 3.6 (H) 08/09/2023   LDLCALC 67 08/09/2023   CREATININE 0.85 02/08/2024   GI symptoms improved off of metformin , continues farxiga , slightly above goal last A1C. Update today.

## 2024-03-13 NOTE — Assessment & Plan Note (Signed)
 BP Readings from Last 3 Encounters:  03/13/24 135/73  02/28/24 (!) 146/82  02/20/24 131/85

## 2024-03-16 ENCOUNTER — Ambulatory Visit: Payer: Self-pay

## 2024-03-25 ENCOUNTER — Telehealth: Payer: Self-pay

## 2024-03-25 DIAGNOSIS — N760 Acute vaginitis: Secondary | ICD-10-CM

## 2024-03-25 MED ORDER — FLUCONAZOLE 150 MG PO TABS: 150.0000 mg | ORAL_TABLET | Freq: Once | ORAL | 0 refills | Status: AC

## 2024-03-25 NOTE — Telephone Encounter (Signed)
 Patient notified of new medication and provider's comments.

## 2024-03-25 NOTE — Addendum Note (Signed)
 Addended by: DARYL SETTER on: 03/25/2024 04:49 PM   Modules accepted: Orders

## 2024-03-25 NOTE — Telephone Encounter (Signed)
 I sent an rx in for diflucan , if not improvement after taking needs OV please.

## 2024-03-25 NOTE — Telephone Encounter (Signed)
 Medication request  Copied from CRM [id number]. Topic: Clinical - Medication Question >> Mar 25, 2024  8:37 AM [name] wrote: Reason for CRM: Patient does not want an appt unless she absolutely has to, she used a different soap in the vaginal area and has noticed that the vaginal area is very tender (slightly painful) since Friday. She does not want an appt and just wants to know if we can call something in for this for her please.  If so, can we please use the following pharmacy: [facility] [address] ([address] [place] - [address] [place] ([place]) [place] [address] Phone: [phone] Fax: [phone] Hours: Not open 24 hours  Patient callback is [phone] (home)

## 2024-04-03 ENCOUNTER — Other Ambulatory Visit

## 2024-04-06 ENCOUNTER — Other Ambulatory Visit

## 2024-04-07 ENCOUNTER — Ambulatory Visit: Payer: Self-pay | Admitting: Internal Medicine

## 2024-04-07 LAB — T4, FREE: Free T4: 1.4 ng/dL (ref 0.8–1.8)

## 2024-04-07 LAB — T3, FREE: T3, Free: 3.1 pg/mL (ref 2.3–4.2)

## 2024-04-07 LAB — TSH: TSH: 2.33 m[IU]/L (ref 0.40–4.50)

## 2024-04-08 ENCOUNTER — Other Ambulatory Visit: Payer: Self-pay

## 2024-04-08 DIAGNOSIS — E0590 Thyrotoxicosis, unspecified without thyrotoxic crisis or storm: Secondary | ICD-10-CM

## 2024-04-08 MED ORDER — METHIMAZOLE 5 MG PO TABS: ORAL_TABLET | ORAL | 5 refills | Status: AC

## 2024-04-17 ENCOUNTER — Other Ambulatory Visit: Payer: Self-pay | Admitting: Family

## 2024-04-17 ENCOUNTER — Other Ambulatory Visit: Payer: Self-pay | Admitting: Internal Medicine

## 2024-04-17 DIAGNOSIS — K3184 Gastroparesis: Secondary | ICD-10-CM

## 2024-04-17 DIAGNOSIS — E1165 Type 2 diabetes mellitus with hyperglycemia: Secondary | ICD-10-CM

## 2024-04-20 ENCOUNTER — Other Ambulatory Visit: Payer: Self-pay

## 2024-04-20 ENCOUNTER — Encounter (HOSPITAL_BASED_OUTPATIENT_CLINIC_OR_DEPARTMENT_OTHER): Payer: Self-pay | Admitting: Emergency Medicine

## 2024-04-20 ENCOUNTER — Emergency Department (HOSPITAL_BASED_OUTPATIENT_CLINIC_OR_DEPARTMENT_OTHER)

## 2024-04-20 ENCOUNTER — Emergency Department (HOSPITAL_BASED_OUTPATIENT_CLINIC_OR_DEPARTMENT_OTHER)
Admission: EM | Admit: 2024-04-20 | Discharge: 2024-04-20 | Disposition: A | Discharge status: Home / Self Care | Source: Ambulatory Visit | Attending: Emergency Medicine | Admitting: Emergency Medicine

## 2024-04-20 ENCOUNTER — Ambulatory Visit: Payer: Self-pay

## 2024-04-20 ENCOUNTER — Ambulatory Visit
Admission: EM | Admit: 2024-04-20 | Discharge: 2024-04-20 | Disposition: A | Discharge status: Home / Self Care | Attending: Emergency Medicine | Admitting: Emergency Medicine

## 2024-04-20 DIAGNOSIS — E119 Type 2 diabetes mellitus without complications: Secondary | ICD-10-CM | POA: Insufficient documentation

## 2024-04-20 DIAGNOSIS — R079 Chest pain, unspecified: Secondary | ICD-10-CM

## 2024-04-20 DIAGNOSIS — R0789 Other chest pain: Secondary | ICD-10-CM | POA: Diagnosis present

## 2024-04-20 DIAGNOSIS — Z7982 Long term (current) use of aspirin: Secondary | ICD-10-CM | POA: Diagnosis not present

## 2024-04-20 DIAGNOSIS — R001 Bradycardia, unspecified: Secondary | ICD-10-CM

## 2024-04-20 DIAGNOSIS — Z79899 Other long term (current) drug therapy: Secondary | ICD-10-CM | POA: Diagnosis not present

## 2024-04-20 DIAGNOSIS — I10 Essential (primary) hypertension: Secondary | ICD-10-CM | POA: Insufficient documentation

## 2024-04-20 LAB — CBC
HCT: 39.2 % (ref 36.0–46.0)
Hemoglobin: 11.9 g/dL — ABNORMAL LOW (ref 12.0–15.0)
MCH: 22.5 pg — ABNORMAL LOW (ref 26.0–34.0)
MCHC: 30.4 g/dL (ref 30.0–36.0)
MCV: 74.1 fL — ABNORMAL LOW (ref 80.0–100.0)
Platelets: 223 K/uL (ref 150–400)
RBC: 5.29 MIL/uL — ABNORMAL HIGH (ref 3.87–5.11)
RDW: 16.7 % — ABNORMAL HIGH (ref 11.5–15.5)
WBC: 10.3 K/uL (ref 4.0–10.5)
nRBC: 0 % (ref 0.0–0.2)

## 2024-04-20 LAB — BASIC METABOLIC PANEL WITH GFR
Anion gap: 11 (ref 5–15)
BUN: 26 mg/dL — ABNORMAL HIGH (ref 8–23)
CO2: 26 mmol/L (ref 22–32)
Calcium: 9.2 mg/dL (ref 8.9–10.3)
Chloride: 104 mmol/L (ref 98–111)
Creatinine, Ser: 0.88 mg/dL (ref 0.44–1.00)
GFR, Estimated: >60 mL/min (ref >60)
Glucose, Bld: 119 mg/dL — ABNORMAL HIGH (ref 70–99)
Potassium: 3.7 mmol/L (ref 3.5–5.1)
Sodium: 140 mmol/L (ref 135–145)

## 2024-04-20 LAB — D-DIMER, QUANTITATIVE: D-Dimer, Quant: 0.96 ug{FEU}/mL — ABNORMAL HIGH (ref 0.00–0.50)

## 2024-04-20 LAB — TROPONIN T, HIGH SENSITIVITY: Troponin T High Sensitivity: <15 ng/L (ref 0–19)

## 2024-04-20 MED ORDER — ACETAMINOPHEN 325 MG PO TABS
650.0000 mg | ORAL_TABLET | Freq: Once | ORAL | Status: AC
  Administered 2024-04-20: 650 mg via ORAL
  Filled 2024-04-20: qty 2

## 2024-04-20 MED ORDER — ALBUTEROL SULFATE HFA 108 (90 BASE) MCG/ACT IN AERS
2.0000 | INHALATION_SPRAY | Freq: Once | RESPIRATORY_TRACT | Status: AC
Start: 2024-04-20 — End: 2024-04-20
  Administered 2024-04-20: 2 via RESPIRATORY_TRACT
  Filled 2024-04-20: qty 6.7

## 2024-04-20 MED ORDER — ONDANSETRON HCL 4 MG/2ML IJ SOLN
4.0000 mg | Freq: Once | INTRAMUSCULAR | Status: AC
  Administered 2024-04-20: 4 mg via INTRAVENOUS
  Filled 2024-04-20: qty 2

## 2024-04-20 MED ORDER — IOHEXOL 350 MG/ML SOLN
75.0000 mL | Freq: Once | INTRAVENOUS | Status: AC | PRN
  Administered 2024-04-20: 75 mL via INTRAVENOUS

## 2024-04-20 NOTE — Telephone Encounter (Signed)
 This RN attempted to reach pt to triage. LVM to call back to office  Message from [facility] S sent at 04/20/2024  1:03 PM EDT  Reason for Triage: pt calling because bp is low 113/30 with pulse 44. She has been feeling dizzy. Call back number [phone]

## 2024-04-20 NOTE — ED Notes (Signed)
 Patient is being discharged from the Urgent Care and sent to the Emergency Department via private vehicle with daughter . Per Asberry PA, patient is in need of higher level of care due to bradycardia with reported dizziness, left-sided chest pain/discomfort, and numbness in left hand. Patient is aware and verbalizes understanding of plan of care.   Vitals:   04/20/24 1850 04/20/24 1948  BP: 138/70 134/75  Pulse: (!) 49 (!) 47  Resp: 16 16  Temp: 98.1 F (36.7 C) 98.3 F (36.8 C)  SpO2: 97% 98%

## 2024-04-20 NOTE — Discharge Instructions (Addendum)
 Increase your Protonix  to 40 mg a day for 2 weeks and then go back down to your 20 mg dose.  Follow-up with your primary care doctor and gastroenterologist.  Return if symptoms worsen.   Recommend 2 puffs of your albuterol  inhaler every 4-6 hours as needed.

## 2024-04-20 NOTE — ED Notes (Addendum)
 Reviewed vitals with Asberry, [place] with patient complaining of hypotension in lobby. Asberry, PA felt comfortable with pt being placed back in lobby.

## 2024-04-20 NOTE — ED Provider Notes (Signed)
 [facility]    CSN: [id number] Arrival date & time: 04/20/24  1845      History   Chief Complaint Chief Complaint  Patient presents with   Medical Complaint   Bradycardia    HPI [name] is a 63 y.o. female.  Initially checked in and triaged for concerns of low blood pressure taken at the fire department. Reading was 113 systolic. Found to have normotensive BP during initial triage in urgent care.   When triaged in the room by RN, she then reported having dizziness and chest pain.  Has been ongoing since this morning. Dizziness does occur at rest. Chest pain is left sided, and feels it radiate into the left arm. Tingling sensation in left fingers.  Denies shortness of breath, headache, weakness   History of HTN, DMT2, hyperthyroidism, hyperlipidemia, gastroparesis, CAD, aortic atherosclerosis  Followed by cardiology. On isosorbide  mononitrate daily   Past Medical History:  Diagnosis Date   Allergy    allergic rhinitis   Arthritis    Atypical chest pain    Constipation    Cyst, ovarian    Diabetes (HCC)    Diabetes mellitus, type II (HCC)    Elevated alkaline phosphatase level    Endometriosis    Esophageal stricture    Fatty liver    Gastroparesis    GERD (gastroesophageal reflux disease)    Heart murmur    Hepatic hemangioma    Hyperlipidemia    Hypertension    Hypertrophic condition of skin    acrokeratoelastoidosis- s/p 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 1/08- benign   Iron  deficiency anemia    Migraine headache    Non-compliance    Peptic ulcer disease    Sleep apnea    Thyroid  disease     Patient Active Problem List   Diagnosis Date Noted   OSA (obstructive sleep apnea) 03/13/2024   Obesity, morbid (HCC) 03/13/2024   Unilateral primary osteoarthritis, left knee 10/04/2023   Type 2 diabetes mellitus with hyperglycemia, without long-term current use of insulin  (HCC) 08/16/2023   Dysfunction of both eustachian tubes 08/16/2023   Positive ANA  (antinuclear antibody) 03/22/2023   Sedimentation rate elevation 03/22/2023   Bulging of lumbar intervertebral disc 09/24/2022   Arthritis 05/11/2022   Controlled type 2 diabetes mellitus without complication, without long-term current use of insulin  (HCC) 12/01/2021   Coronary artery disease of native heart with stable angina pectoris 06/30/2021   Hypercontractile esophagus    Aortic atherosclerosis 07/17/2017   Insomnia 01/12/2017   Hyperthyroidism 02/29/2012   Esophageal dysphagia 04/18/2011   Ulnar neuropathy 03/30/2011   Lumbar radiculopathy 10/18/2010   Fibromyalgia 07/20/2009   Hyperlipidemia 05/26/2009   Vitamin D  deficiency 05/23/2009   Polyarthralgia 04/12/2009   Iron  deficiency anemia 10/25/2008   GERD 10/25/2008   LIVER HEMANGIOMA 01/01/2008   ESOPHAGEAL STRICTURE 01/01/2008   Gastroparesis 01/01/2008   Anemia 12/08/2007   Allergic rhinitis 12/08/2007   Migraine headache 08/11/2007   HIATAL HERNIA 08/11/2007   OVARIAN CYST 08/11/2007   Hypertension associated with diabetes (HCC) 05/06/2007    Past Surgical History:  Procedure Laterality Date   ABDOMINAL EXPLORATION SURGERY     w/bso    BREAST BIOPSY     CARDIAC CATHETERIZATION  2009   mild non obstructive CAD   CHOLECYSTECTOMY     COLONOSCOPY     ESOPHAGEAL MANOMETRY N/A 08/01/2020   Procedure: ESOPHAGEAL MANOMETRY (EM);  Surgeon: Leigh Elspeth SQUIBB, MD;  Location: [facility];  Service: Gastroenterology;  Laterality: N/A;   KNEE  SURGERY  2005    left knee   LEFT HEART CATH AND CORONARY ANGIOGRAPHY N/A 07/17/2017   Procedure: LEFT HEART CATH AND CORONARY ANGIOGRAPHY;  Surgeon: Jordan, Peter M, MD;  Location: [facility] INVASIVE CV LAB;  Service: Cardiovascular;  Laterality: N/A;   LEFT HEART CATH AND CORONARY ANGIOGRAPHY N/A 09/01/2021   Procedure: LEFT HEART CATH AND CORONARY ANGIOGRAPHY;  Surgeon: Verlin Lonni BIRCH, MD;  Location: [facility] INVASIVE CV LAB;  Service: Cardiovascular;  Laterality: N/A;   POLYPECTOMY      TOTAL ABDOMINAL HYSTERECTOMY     UPPER GASTROINTESTINAL ENDOSCOPY      OB History   No obstetric history on file.      Home Medications    Prior to Admission medications   Medication Sig Start Date End Date Taking? Authorizing Provider  Accu-Chek Softclix Lancets lancets 3 (three) times daily. 08/16/23   [provider]  albuterol  (VENTOLIN  HFA) 108 (90 Base) MCG/ACT inhaler INHALE 2 PUFFS BY MOUTH 4 TIMES DAILY 08/18/21   O'Sullivan, Melissa, NP  alclomethasone (ACLOVATE ) 0.05 % cream Apply topically 2 (two) times daily as needed (Rash). 01/26/21   Sheffield, Kelli R, PA-C  amLODipine  (NORVASC ) 10 MG tablet Take 1 tablet (10 mg total) by mouth daily. 03/13/24   Daryl Setter, NP  aspirin  81 MG chewable tablet Chew 1 tablet (81 mg total) by mouth daily. 07/19/17   Jadine Toribio SQUIBB, MD  Blood Glucose Monitoring Suppl DEVI 1 each by Does not apply route in the morning, at noon, and at bedtime. May substitute to any manufacturer covered by patient's insurance. 08/16/23   Antonio Cyndee Rockers R, DO  chlorthalidone  (HYGROTON ) 25 MG tablet Take 1 tablet (25 mg total) by mouth daily. 09/02/23 03/13/24  Dunn, Dayna N, PA-C  ciprofloxacin -dexamethasone  (CIPRODEX ) OTIC suspension 4 drops to right ear twice daily 12/19/23   [provider]  clobetasol  (TEMOVATE ) 0.05 % external solution Apply 1 application topically 2 (two) times daily. 01/26/21   Sheffield, Kelli R, PA-C  DULoxetine  (CYMBALTA ) 60 MG capsule Take 1 capsule (60 mg total) by mouth daily. 01/10/24   Jeannetta Lonni ORN, MD  Evolocumab  (REPATHA  SURECLICK) 140 MG/ML SOAJ INJECT 140 MG INTO THE SKIN EVERY 14 DAYS 01/20/24   Okey Vina GAILS, MD  FARXIGA  5 MG TABS tablet Take 1 tablet (5 mg total) by mouth daily before breakfast. 04/17/24   O'Sullivan, Melissa, NP  fluticasone  (FLONASE ) 50 MCG/ACT nasal spray Place 2 sprays into both nostrils daily. 01/17/21   Saguier, Dallas, PA-C  furosemide  (LASIX ) 20 MG tablet Take 1 tablet  (20 mg total) by mouth daily as needed (for shortness of breath or weight gain of 2 lbs overnight or 5 lbs in a week). 08/22/22   Lucien Orren SAILOR, PA-C  glucose blood (ACCU-CHEK GUIDE TEST) test strip USE 1 EACH BY IN VITRO ROUTE IN [place], AT NOON, AND AT BEDTIME 10/23/23   O'Sullivan, Melissa, NP  isosorbide  mononitrate (IMDUR ) 60 MG 24 hr tablet TAKE 1 & 1/2 (ONE & ONE-HALF) TABLETS BY MOUTH ONCE DAILY 12/10/22   Okey Vina GAILS, MD  loratadine  (CLARITIN ) 10 MG tablet Take 1 tablet (10 mg total) by mouth daily. 07/15/20   Geiple, Joshua, PA-C  meclizine  (ANTIVERT ) 12.5 MG tablet Take 1 tablet (12.5 mg total) by mouth 3 (three) times daily as needed for dizziness. 10/01/23   O'Sullivan, Melissa, NP  meloxicam  (MOBIC ) 7.5 MG tablet Take 1 tablet (7.5 mg total) by mouth daily. 03/13/24   O'Sullivan, Melissa, NP  methimazole  (TAPAZOLE ) 5 MG tablet Take 5 mg every other day 04/08/24   Trixie File, MD  methocarbamol  (ROBAXIN ) 500 MG tablet Take 1 tablet (500 mg total) by mouth at bedtime as needed for muscle spasms. 01/10/24   Rice, Lonni ORN, MD  metoCLOPramide  (REGLAN ) 5 MG tablet TAKE 1 TABLET BY MOUTH THREE TIMES DAILY BEFORE MEAL(S). 04/17/24   O'Sullivan, Melissa, NP  Multiple Vitamins-Minerals (MULTIVITAMIN WITH MINERALS) tablet Take 1 tablet by mouth daily. 12/09/23   O'Sullivan, Melissa, NP  ondansetron  (ZOFRAN -ODT) 4 MG disintegrating tablet Take 1 tablet (4 mg total) by mouth every 8 (eight) hours as needed for nausea or vomiting. 01/20/24   Armbruster, Elspeth SQUIBB, MD  pantoprazole  (PROTONIX ) 40 MG tablet Take 1 tablet (40 mg total) by mouth 2 (two) times daily. 01/20/24   Armbruster, Elspeth SQUIBB, MD  potassium chloride  SA (KLOR-CON  M20) 20 MEQ tablet Take 2 tablets (40 mEq total) by mouth daily. 09/02/23 03/13/24  Dunn, Dayna N, PA-C  rosuvastatin  (CRESTOR ) 40 MG tablet Take 1 tablet (40 mg total) by mouth daily. 04/17/24   O'Sullivan, Melissa, NP  SUMAtriptan  (IMITREX ) 50 MG tablet Take 50 mg by  mouth daily as needed for migraine.    [provider]  tretinoin  (RETIN-A ) 0.05 % cream Apply topically at bedtime. apply pea size (1gm) amount to face 2-3 nights a week. 02/20/24 02/19/25  Alm Delon SAILOR, DO  Vitamin D , Ergocalciferol , (DRISDOL ) 1.25 MG (50000 UNIT) CAPS capsule Take 1 capsule (50,000 Units total) by mouth every 7 (seven) days. 03/13/24   Daryl Setter, NP  XIIDRA 5 % SOLN Apply 1 drop to eye 2 (two) times daily. 06/12/22   [provider]    Family History Family History  Problem Relation Age of Onset   Hypertension Mother    Rheum arthritis Mother    Hypertension Father    Diabetes Father    Prostate cancer Father    Kidney disease Father    Diabetes Sister    Fibromyalgia Sister    Diabetes Maternal Grandmother    Lung cancer Maternal Grandfather    Arthritis Brother    Migraines Brother    CAD Brother    Fibromyalgia Sister    Migraines Sister    Colon cancer Neg Hx    Thyroid  disease Neg Hx    Colon polyps Neg Hx     Social History Social History   Tobacco Use   Smoking status: Former    Current packs/day: 0.00    Average packs/day: 0.5 packs/day for 18.0 years (9.0 ttl pk-yrs)    Types: Cigarettes    Start date: 07/29/1977    Quit date: 06/25/1994    Years since quitting: 29.8   Smokeless tobacco: Never   Tobacco comments:    quit 19 years ago  Vaping Use   Vaping status: Never Used  Substance Use Topics   Alcohol use: Yes    Alcohol/week: 0.0 standard drinks of alcohol    Comment: social drinker   Drug use: No     Allergies   Celebrex [celecoxib], Diovan  [valsartan ], Metformin , and Ace inhibitors   Review of Systems Review of Systems  As per HPI  Physical Exam Triage Vital Signs ED Triage Vitals  Encounter Vitals Group     BP 04/20/24 1850 138/70     Girls Systolic BP Percentile --      Girls Diastolic BP Percentile --      Boys Systolic BP Percentile --      Boys  Diastolic BP Percentile --      Pulse  Rate 04/20/24 1850 (!) 49     Resp 04/20/24 1850 16     Temp 04/20/24 1850 98.1 F (36.7 C)     Temp Source 04/20/24 1850 Oral     SpO2 04/20/24 1850 97 %     Weight 04/20/24 1850 216 lb (98 kg)     Height 04/20/24 1850 5' 4 (1.626 m)     Head Circumference --      Peak Flow --      Pain Score 04/20/24 2008 6     Pain Loc --      Pain Education --      Exclude from Growth Chart --    Orthostatic VS for the past 24 hrs:  BP- Lying Pulse- Lying BP- Sitting Pulse- Sitting BP- Standing at 0 minutes Pulse- Standing at 0 minutes  04/20/24 2017 121/66 (!) 46 139/77 (!) 48 117/63 53    Updated Vital Signs BP 134/75 (BP Location: Right Arm)   Pulse (!) 46   Temp 98.3 F (36.8 C) (Oral)   Resp 16   Ht 5' 4 (1.626 m)   Wt 216 lb (98 kg)   SpO2 98%   BMI 37.08 kg/m    Physical Exam Vitals and nursing note reviewed.  Constitutional:      General: She is not in acute distress.    Appearance: Normal appearance. She is not ill-appearing or diaphoretic.  HENT:     Mouth/Throat:     Pharynx: Oropharynx is clear.  Cardiovascular:     Rate and Rhythm: Regular rhythm. Bradycardia present.     Pulses: Normal pulses.     Heart sounds: Normal heart sounds.  Pulmonary:     Effort: Pulmonary effort is normal.     Breath sounds: Normal breath sounds.  Skin:    General: Skin is warm and dry.     Comments: Diffuse papules/wart-like lesions on bilateral dorsal hands, reported chronic   Neurological:     Mental Status: She is alert and oriented to person, place, and time.     Motor: No weakness.     Gait: Gait normal.     UC Treatments / Results  Labs (all labs ordered are listed, but only abnormal results are displayed) Labs Reviewed - No data to display  EKG  Radiology No results found.  Procedures Procedures   Medications Ordered in UC Medications - No data to display  Initial Impression / Assessment and Plan / UC Course  I have reviewed the triage vital signs and the  nursing notes.  Pertinent labs & imaging results that were available during my care of the patient were reviewed by me and considered in my medical decision making (see chart for details).  Heart rate manually by this provider 46 bpm Normotensive  EKG sinus bradycardia, ventricular rate of 46 bpm. Premature beats. No obvious ST or T wave changes.   Symptomatic bradycardia, and chest pain, requires higher level care. Further workup recommended in emergency department.  Patient daughter is comfortable transporting her to the nearest ED.  Final Clinical Impressions(s) / UC Diagnoses   Final diagnoses:  Symptomatic bradycardia  Chest pain, unspecified type   Discharge Instructions   None    ED Prescriptions   None    PDMP not reviewed this encounter.   Jeryl Asberry RIGGERS 04/20/24 2038

## 2024-04-20 NOTE — ED Triage Notes (Signed)
 Pt presents with a chief complaint of bradycardia. States her pulse has been running in the 40's today and this is abnormal for her. Initially told staff her BP has been elevated, normal in triage. Only symptoms pt is experiencing some dizziness, discomfort in left-side of chest, and numbness in left hand. Currently rates overall pain a 6/10. Only daily medications taken today. Gait is steady. Respirations regular/unlabored/even.

## 2024-04-20 NOTE — Telephone Encounter (Signed)
 FYI Only or Action Required?: FYI only for provider.  Patient was last seen in primary care on 03/13/2024 by Daryl Setter, NP.  Called Nurse Triage reporting Hypotension.  Symptoms began today.  Interventions attempted: Nothing.  Symptoms are: gradually worsening.  Triage Disposition: Go to ED Now (or PCP Triage)  Patient/caregiver understands and will follow disposition?: Yes   Reason for Disposition  [1] Fall in systolic BP > 20 mm Hg from normal AND [2] feeling weak or lightheaded  Answer Assessment - Initial Assessment Questions Patient states she is currently still feeling dizzy and fatigue. This RN advised pt to go to nearest UC/ ED for symptoms. Patient states she will go to [facility] once her daughter arrives home to drive her. This RN also advised her to call 911 if symptoms worsen before her daughter arrives.    1. BLOOD PRESSURE: What is your blood pressure? Did you take at least two measurements 5 minutes apart?     113/40 HR 44 ,  2. ONSET: When did you take your blood pressure?     Today 3. HOW: How did you take your blood pressure? (e.g., visiting nurse, automatic home BP monitor)     She went to fire department to get BP checked  4. HISTORY: Do you have a history of low blood pressure? What is your blood pressure normally?     Denies, she states her BP is normally elevated. 5. MEDICINES: Are you taking any medicines for blood pressure? If Yes, ask: Have they been changed recently?     Yes she is currently taking meds for BP  6. PULSE RATE: Do you know what your pulse rate is?      44  Protocols used: Blood Pressure - Low-A-AH

## 2024-04-20 NOTE — ED Provider Notes (Signed)
 [place] [facility] Provider Note   CSN: [id number] Arrival date & time: 04/20/24  2050     Patient presents with: Cough, Shortness of Breath, and Chest Pain   [name] is a 63 y.o. female.   Patient here with low heart rate chest discomfort generalized weakness today.  Nothing makes it worse or better.  Thought her blood pressure was low 1 to fire department checked her heart rate went to urgent care urgent care got nervous about low heart rate and sent her here for evaluation.  She has a history of high cholesterol hypertension diabetes gastroparesis migraines.  She is not having any major symptoms now except for some tightness cough.  She uses inhaler at times.  Denies fever denies any weakness numbness tingling.  Denies any vision loss.  No headache.  Denies any fever or chills.  The history is provided by the patient.       Prior to Admission medications   Medication Sig Start Date End Date Taking? Authorizing Provider  Accu-Chek Softclix Lancets lancets 3 (three) times daily. 08/16/23   [provider]  albuterol  (VENTOLIN  HFA) 108 (90 Base) MCG/ACT inhaler INHALE 2 PUFFS BY MOUTH 4 TIMES DAILY 08/18/21   O'Sullivan, Melissa, NP  alclomethasone (ACLOVATE ) 0.05 % cream Apply topically 2 (two) times daily as needed (Rash). 01/26/21   Sheffield, Kelli R, PA-C  amLODipine  (NORVASC ) 10 MG tablet Take 1 tablet (10 mg total) by mouth daily. 03/13/24   Daryl Setter, NP  aspirin  81 MG chewable tablet Chew 1 tablet (81 mg total) by mouth daily. 07/19/17   Jadine Toribio SQUIBB, MD  Blood Glucose Monitoring Suppl DEVI 1 each by Does not apply route in the morning, at noon, and at bedtime. May substitute to any manufacturer covered by patient's insurance. 08/16/23   Antonio Cyndee Jamee JONELLE, DO  chlorthalidone  (HYGROTON ) 25 MG tablet Take 1 tablet (25 mg total) by mouth daily. 09/02/23 03/13/24  Dunn, Dayna N, PA-C  ciprofloxacin -dexamethasone   (CIPRODEX ) OTIC suspension 4 drops to right ear twice daily 12/19/23   [provider]  clobetasol  (TEMOVATE ) 0.05 % external solution Apply 1 application topically 2 (two) times daily. 01/26/21   Sheffield, Kelli R, PA-C  DULoxetine  (CYMBALTA ) 60 MG capsule Take 1 capsule (60 mg total) by mouth daily. 01/10/24   Jeannetta Lonni ORN, MD  Evolocumab  (REPATHA  SURECLICK) 140 MG/ML SOAJ INJECT 140 MG INTO THE SKIN EVERY 14 DAYS 01/20/24   Okey Vina GAILS, MD  FARXIGA  5 MG TABS tablet Take 1 tablet (5 mg total) by mouth daily before breakfast. 04/17/24   O'Sullivan, Melissa, NP  fluticasone  (FLONASE ) 50 MCG/ACT nasal spray Place 2 sprays into both nostrils daily. 01/17/21   Saguier, Dallas, PA-C  furosemide  (LASIX ) 20 MG tablet Take 1 tablet (20 mg total) by mouth daily as needed (for shortness of breath or weight gain of 2 lbs overnight or 5 lbs in a week). 08/22/22   Lucien Orren SAILOR, PA-C  glucose blood (ACCU-CHEK GUIDE TEST) test strip USE 1 EACH BY IN VITRO ROUTE IN [place], AT NOON, AND AT BEDTIME 10/23/23   O'Sullivan, Melissa, NP  isosorbide  mononitrate (IMDUR ) 60 MG 24 hr tablet TAKE 1 & 1/2 (ONE & ONE-HALF) TABLETS BY MOUTH ONCE DAILY 12/10/22   Okey Vina GAILS, MD  loratadine  (CLARITIN ) 10 MG tablet Take 1 tablet (10 mg total) by mouth daily. 07/15/20   Desiderio Chew, PA-C  meclizine  (ANTIVERT ) 12.5 MG tablet Take 1  tablet (12.5 mg total) by mouth 3 (three) times daily as needed for dizziness. 10/01/23   O'Sullivan, Melissa, NP  meloxicam  (MOBIC ) 7.5 MG tablet Take 1 tablet (7.5 mg total) by mouth daily. 03/13/24   O'Sullivan, Melissa, NP  methimazole  (TAPAZOLE ) 5 MG tablet Take 5 mg every other day 04/08/24   Trixie File, MD  methocarbamol  (ROBAXIN ) 500 MG tablet Take 1 tablet (500 mg total) by mouth at bedtime as needed for muscle spasms. 01/10/24   Rice, Lonni ORN, MD  metoCLOPramide  (REGLAN ) 5 MG tablet TAKE 1 TABLET BY MOUTH THREE TIMES DAILY BEFORE MEAL(S). 04/17/24   O'Sullivan,  Melissa, NP  Multiple Vitamins-Minerals (MULTIVITAMIN WITH MINERALS) tablet Take 1 tablet by mouth daily. 12/09/23   O'Sullivan, Melissa, NP  ondansetron  (ZOFRAN -ODT) 4 MG disintegrating tablet Take 1 tablet (4 mg total) by mouth every 8 (eight) hours as needed for nausea or vomiting. 01/20/24   Armbruster, Elspeth SQUIBB, MD  pantoprazole  (PROTONIX ) 40 MG tablet Take 1 tablet (40 mg total) by mouth 2 (two) times daily. 01/20/24   Armbruster, Elspeth SQUIBB, MD  potassium chloride  SA (KLOR-CON  M20) 20 MEQ tablet Take 2 tablets (40 mEq total) by mouth daily. 09/02/23 03/13/24  Dunn, Dayna N, PA-C  rosuvastatin  (CRESTOR ) 40 MG tablet Take 1 tablet (40 mg total) by mouth daily. 04/17/24   O'Sullivan, Melissa, NP  SUMAtriptan  (IMITREX ) 50 MG tablet Take 50 mg by mouth daily as needed for migraine.    [provider]  tretinoin  (RETIN-A ) 0.05 % cream Apply topically at bedtime. apply pea size (1gm) amount to face 2-3 nights a week. 02/20/24 02/19/25  Alm Delon SAILOR, DO  Vitamin D , Ergocalciferol , (DRISDOL ) 1.25 MG (50000 UNIT) CAPS capsule Take 1 capsule (50,000 Units total) by mouth every 7 (seven) days. 03/13/24   Daryl Setter, NP  XIIDRA 5 % SOLN Apply 1 drop to eye 2 (two) times daily. 06/12/22   [provider]    Allergies: Celebrex [celecoxib], Diovan  [valsartan ], Metformin , and Ace inhibitors    Review of Systems  Updated Vital Signs  ED Triage Vitals  Encounter Vitals Group     BP 04/20/24 2101 (!) 146/70     Girls Systolic BP Percentile --      Girls Diastolic BP Percentile --      Boys Systolic BP Percentile --      Boys Diastolic BP Percentile --      Pulse Rate 04/20/24 2101 (!) 47     Resp 04/20/24 2101 (!) 21     Temp 04/20/24 2101 98.5 F (36.9 C)     Temp Source 04/20/24 2101 Oral     SpO2 04/20/24 2101 99 %     Weight 04/20/24 2057 216 lb (98 kg)     Height 04/20/24 2057 5' 4 (1.626 m)     Head Circumference --      Peak Flow --      Pain Score 04/20/24 2056 6      Pain Loc --      Pain Education --      Exclude from Growth Chart --       Physical Exam Vitals and nursing note reviewed.  Constitutional:      General: She is not in acute distress.    Appearance: She is well-developed.  HENT:     Head: Normocephalic and atraumatic.     Nose: Nose normal.     Mouth/Throat:     Mouth: Mucous membranes are moist.  Eyes:  Extraocular Movements: Extraocular movements intact.     Conjunctiva/sclera: Conjunctivae normal.     Pupils: Pupils are equal, round, and reactive to light.  Cardiovascular:     Rate and Rhythm: Normal rate and regular rhythm.     Pulses: Normal pulses.     Heart sounds: Normal heart sounds. No murmur heard. Pulmonary:     Effort: Pulmonary effort is normal. No respiratory distress.     Breath sounds: Wheezing present.     Comments: Very faint wheeze on exam Abdominal:     General: Abdomen is flat.     Palpations: Abdomen is soft.     Tenderness: There is no abdominal tenderness.  Musculoskeletal:        General: No swelling.     Cervical back: Neck supple.  Skin:    General: Skin is warm and dry.     Capillary Refill: Capillary refill takes less than 2 seconds.  Neurological:     General: No focal deficit present.     Mental Status: She is alert and oriented to person, place, and time.     Cranial Nerves: No cranial nerve deficit.     Sensory: No sensory deficit.     Motor: No weakness.     Coordination: Coordination normal.     Comments: Normal strength and sensation, normal speech normal finger-to-nose finger, normal visual fields  Psychiatric:        Mood and Affect: Mood normal.     (all labs ordered are listed, but only abnormal results are displayed) Labs Reviewed  BASIC METABOLIC PANEL WITH GFR - Abnormal; Notable for the following components:      Result Value   Glucose, Bld 119 (*)    BUN 26 (*)    All other components within normal limits  CBC - Abnormal; Notable for the following  components:   RBC 5.29 (*)    Hemoglobin 11.9 (*)    MCV 74.1 (*)    MCH 22.5 (*)    RDW 16.7 (*)    All other components within normal limits  D-DIMER, QUANTITATIVE - Abnormal; Notable for the following components:   D-Dimer, Quant 0.96 (*)    All other components within normal limits  TROPONIN T, HIGH SENSITIVITY    EKG: EKG Interpretation Date/Time:  Monday April 20 2024 20:59:39 EDT Ventricular Rate:  46 PR Interval:  152 QRS Duration:  111 QT Interval:  513 QTC Calculation: 449 R Axis:   -27  Text Interpretation: Sinus bradycardia Borderline left axis deviation Confirmed by Ruthe Cornet [phone]) on 04/20/2024 9:00:58 PM  Radiology: CT Angio Chest PE W and/or Wo Contrast Result Date: 04/20/2024 EXAM: CTA of the Chest with contrast for PE 04/20/2024 10:01:25 PM TECHNIQUE: CTA of the chest was performed after the administration of 75 mL of iohexol  (OMNIPAQUE ) 350 MG/ML injection. Multiplanar reformatted images are provided for review. MIP images are provided for review. Automated exposure control, iterative reconstruction, and/or weight based adjustment of the mA/kV was utilized to reduce the radiation dose to as low as reasonably achievable. Best study obtainable due to patient's need to cough during scan. COMPARISON: PET CT 11/20/1998 and 11/20/2023. CT abdomen and pelvis from 03/02/2002. CLINICAL HISTORY: Pulmonary embolism (PE) suspected, high prob. Chest pain, SOB, and dizziness with bradycardia. Hx diabetes, atypical chest pain, esophageal stricture, GERD, heart murmur, HTN, cardiac cath, and esophageal manometry. 75 mls Omni 350 IV. Best study obtainable due to pt's need to cough during scan. FINDINGS: PULMONARY ARTERIES: Pulmonary arteries are  adequately opacified for evaluation. No pulmonary embolism. Main pulmonary artery is normal in caliber. MEDIASTINUM: The heart and pericardium demonstrate no acute abnormality. There is no acute abnormality of the thoracic aorta. LYMPH  NODES: No mediastinal, hilar or axillary lymphadenopathy. LUNGS AND PLEURA: The lungs are without acute process. No focal consolidation or pulmonary edema. No pleural effusion or pneumothorax. UPPER ABDOMEN: Cholecystectomy clips are present. There is a rounded hypodensity in the right lobe of the liver measuring 1.9 cm, unchanged from prior examination, previously characterized as hemangioma. SOFT TISSUES AND BONES: There is distal esophageal wall thickening. No acute bone or soft tissue abnormality. IMPRESSION: 1. No evidence of pulmonary embolism. 2. Distal esophageal wall thickening; correlate clinically for esophagitis or other esophageal pathology as indicated. Electronically signed by: Greig Pique MD 04/20/2024 10:33 PM EDT RP Workstation: [id number]   DG Chest Portable 1 View Result Date: 04/20/2024 EXAM: 1 VIEW(S) XRAY OF THE CHEST 04/20/2024 09:07:34 PM COMPARISON: 08/06/2023 CLINICAL HISTORY: weakness. 63 y/o female. Pt expresses CP SOB, and dizziness. Bradycardia. FINDINGS: LUNGS AND PLEURA: Low lung volumes. Mild elevation of right hemidiaphragm. No focal pulmonary opacity. No pulmonary edema. No pleural effusion. No pneumothorax. HEART AND MEDIASTINUM: No acute abnormality of the cardiac and mediastinal silhouettes. BONES AND SOFT TISSUES: No acute osseous abnormality. IMPRESSION: 1. Low lung volumes and mild elevation of right hemidiaphragm. Electronically signed by: Dorethia Molt MD 04/20/2024 09:12 PM EDT RP Workstation: [id number]     Procedures   Medications Ordered in the ED  albuterol  (VENTOLIN  HFA) 108 (90 Base) MCG/ACT inhaler 2 puff (2 puffs Inhalation Given 04/20/24 2127)  ondansetron  (ZOFRAN ) injection 4 mg (4 mg Intravenous Given 04/20/24 2129)  acetaminophen  (TYLENOL ) tablet 650 mg (650 mg Oral Given 04/20/24 2127)  iohexol  (OMNIPAQUE ) 350 MG/ML injection 75 mL (75 mLs Intravenous Contrast Given 04/20/24 2155)                                    Medical Decision  Making Amount and/or Complexity of Data Reviewed Labs: ordered. Radiology: ordered.  Risk OTC drugs. Prescription drug management.   [name] is here with shortness of breath weakness low heart rate.  She has a sinus bradycardia on EKG which is similar to prior EKGs.  There is no ischemic changes.  She does not complain of any exertional chest pain.  She is well-appearing.  She has very faint wheeze on exam.  She does use inhalers at times but denies smoking history asthma history.  Differential diagnosis suspect bronchitis type process but will evaluate for ACS PE pneumothorax anemia.  Will give albuterol  Zofran  and reevaluate.  Will get CBC BMP troponin D-dimer chest x-ray.  I reviewed interpreted EKG which noted no ischemic changes.  Atypical story for ACS.  Overall lab work showed no significant leukocytosis anemia or electrolyte abnormality.  D-dimer was elevated therefore PE scan was obtained.  Troponin was normal.  Overall atypical story for ACS.  Pains been ongoing all day and doubt ACS.  CT scan showed no evidence of PE.  Did show may be some esophagitis processes.  Possible that this is playing a role in her discomfort.  Will have her increase her Protonix  to 40 mg a day for 2 weeks and follow-up with primary care and GI.  There is also possibly may be a viral component given her cough.  Told to return if symptoms worsen.  This chart was dictated using  voice recognition software.  Despite best efforts to proofread,  errors can occur which can change the documentation meaning.      Final diagnoses:  Atypical chest pain    ED Discharge Orders     None          Ruthe Cornet, DO 04/20/24 2241

## 2024-04-20 NOTE — Telephone Encounter (Signed)
FYI. Pt going to ED.  

## 2024-04-20 NOTE — ED Triage Notes (Signed)
 Pt sent from [facility], due to bradycardia. Pt expresses CP SOB, and dizziness.

## 2024-04-22 ENCOUNTER — Ambulatory Visit: Payer: Self-pay | Admitting: Family

## 2024-04-22 NOTE — Telephone Encounter (Signed)
 Spoke to patient and she was notified again that provider does not have any openings for Friday. She will like to wait ans see if anything  opens up.  Will call patient if we get any cancellations for Friday 10/31

## 2024-04-22 NOTE — Telephone Encounter (Signed)
 Patient will be scheduled for Friday at 11:40

## 2024-04-22 NOTE — Telephone Encounter (Signed)
 Appointment was not made for pt. She requests to see Eleanor Ponto NP, her PCP, Friday 04/24/24 late in the day if possible because she has to pick up grandchildren. Triage could not find a late day appointment as pt. Requested. Please advise pt.

## 2024-04-22 NOTE — Telephone Encounter (Signed)
 Just an FYI.    Note not clear. RN note states that appt was made, then further down states that Pt is requesting to be worked into schedule on Friday. I have sent an error CRM for further clarification. Awaiting response.

## 2024-04-22 NOTE — Telephone Encounter (Signed)
 Called patient a few times but n/a. No appointments available with pcp. Will need to see another provider

## 2024-04-22 NOTE — Telephone Encounter (Signed)
  FYI Only or Action Required?: Action required by provider: request for appointment.  Patient was last seen in primary care on 03/13/2024 by Denise Setter, NP.  Called Nurse Triage reporting Low BP and Fatigue.  Symptoms began a week ago.  Interventions attempted: Rest, hydration, or home remedies.  Symptoms are: stable. Seen in ED 04/20/24. Asking to be worked in Friday late for follow up with PCP. Please advise pt.  Triage Disposition: See PCP When Office is Open (Within 3 Days)  Patient/caregiver understands and will follow disposition?:    Reason for Disposition  [1] MILD weakness (e.g., does not interfere with ability to work, go to school, normal activities) AND [2] persists > 1 week  Answer Assessment - Initial Assessment Questions 1. DESCRIPTION: Describe how you are feeling.     tired 2. SEVERITY: How bad is it?  Can you stand and walk?     yes 3. ONSET: When did these symptoms begin? (e.g., hours, days, weeks, months)     A few days 4. CAUSE: What do you think is causing the weakness or fatigue? (e.g., not drinking enough fluids, medical problem, trouble sleeping)     Maybe BP 5. NEW MEDICINES:  Have you started on any new medicines recently? (e.g., opioid pain medicines, benzodiazepines, muscle relaxants, antidepressants, antihistamines, neuroleptics, beta blockers)     NO 6. OTHER SYMPTOMS: Do you have any other symptoms? (e.g., chest pain, fever, cough, SOB, vomiting, diarrhea, bleeding, other areas of pain)     NO 7. PREGNANCY: Is there any chance you are pregnant? When was your last menstrual period?     NO  Protocols used: Weakness (Generalized) and Fatigue-A-AH

## 2024-04-24 ENCOUNTER — Ambulatory Visit (INDEPENDENT_AMBULATORY_CARE_PROVIDER_SITE_OTHER): Admitting: Family

## 2024-04-24 ENCOUNTER — Telehealth: Payer: Self-pay | Admitting: Family

## 2024-04-24 VITALS — BP 134/67 | HR 61 | Temp 98.0°F | Resp 16 | Ht 64.0 in | Wt 218.6 lb

## 2024-04-24 DIAGNOSIS — K219 Gastro-esophageal reflux disease without esophagitis: Secondary | ICD-10-CM | POA: Diagnosis not present

## 2024-04-24 DIAGNOSIS — R5383 Other fatigue: Secondary | ICD-10-CM | POA: Diagnosis not present

## 2024-04-24 DIAGNOSIS — E538 Deficiency of other specified B group vitamins: Secondary | ICD-10-CM | POA: Diagnosis not present

## 2024-04-24 DIAGNOSIS — E0590 Thyrotoxicosis, unspecified without thyrotoxic crisis or storm: Secondary | ICD-10-CM | POA: Diagnosis not present

## 2024-04-24 DIAGNOSIS — D500 Iron deficiency anemia secondary to blood loss (chronic): Secondary | ICD-10-CM

## 2024-04-24 DIAGNOSIS — R001 Bradycardia, unspecified: Secondary | ICD-10-CM

## 2024-04-24 LAB — B12 AND FOLATE PANEL
Folate: 9.5 ng/mL (ref >5.9)
Vitamin B-12: 180 pg/mL — ABNORMAL LOW (ref 211–911)

## 2024-04-24 NOTE — Telephone Encounter (Signed)
 Please request eye exam from [facility] [address].

## 2024-04-24 NOTE — Progress Notes (Signed)
 Subjective:     Patient ID: [name], female    DOB: [birth year], 63 y.o.   MRN: [id number]  Chief Complaint  Patient presents with   Follow-up    Here for ED follow up   Fatigue    Patient complains of fatigue     HPI  Discussed the use of AI scribe software for clinical note transcription with the patient, who gave verbal consent to proceed.  History of Present Illness  [name] is a 63 year old female with sleep apnea who presents with fatigue and sleep disturbances. On 10/30 she went to urgent care and was found to be bradycardic in the mid forties with c/o fatigue. She was then sent to the  ER visit where her heart rate was again in the mid-forties. EKG was performed and felt to be similar to previous EKG's. A D-dimer test was elevated prompting a CT scan of the chest which ruled out a pulmonary embolism. She is mildly anemic, but within her normal range, and her thyroid  function tests were normal. Her blood sugar levels are in the diabetes range but controlled.  She uses a BiPAP machine for sleep apnea, which she feels is not effective. She has difficulty falling asleep and staying asleep, estimating less than eight hours of sleep with frequent awakenings. The BiPAP machine is not noisy, but she often wakes up to use the bathroom.     Health Maintenance Due  Topic Date Due   OPHTHALMOLOGY EXAM  Never done   COVID-19 Vaccine (5 - 2025-26 season) 02/24/2024   Medicare Annual Wellness (AWV)  06/09/2024    Past Medical History:  Diagnosis Date   Allergy    allergic rhinitis   Arthritis    Atypical chest pain    Constipation    Cyst, ovarian    Diabetes (HCC)    Diabetes mellitus, type II (HCC)    Elevated alkaline phosphatase level    Endometriosis    Esophageal stricture    Fatty liver    Gastroparesis    GERD (gastroesophageal reflux disease)    Heart murmur    Hepatic hemangioma    Hyperlipidemia    Hypertension    Hypertrophic condition of  skin    acrokeratoelastoidosis- s/p 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 1/08- benign   Iron  deficiency anemia    Migraine headache    Non-compliance    Peptic ulcer disease    Sleep apnea    Thyroid  disease     Past Surgical History:  Procedure Laterality Date   ABDOMINAL EXPLORATION SURGERY     w/bso    BREAST BIOPSY     CARDIAC CATHETERIZATION  2009   mild non obstructive CAD   CHOLECYSTECTOMY     COLONOSCOPY     ESOPHAGEAL MANOMETRY N/A 08/01/2020   Procedure: ESOPHAGEAL MANOMETRY (EM);  Surgeon: Leigh Elspeth SQUIBB, MD;  Location: [facility];  Service: Gastroenterology;  Laterality: N/A;   KNEE SURGERY  2005    left knee   LEFT HEART CATH AND CORONARY ANGIOGRAPHY N/A 07/17/2017   Procedure: LEFT HEART CATH AND CORONARY ANGIOGRAPHY;  Surgeon: Jordan, Peter M, MD;  Location: [facility] INVASIVE CV LAB;  Service: Cardiovascular;  Laterality: N/A;   LEFT HEART CATH AND CORONARY ANGIOGRAPHY N/A 09/01/2021   Procedure: LEFT HEART CATH AND CORONARY ANGIOGRAPHY;  Surgeon: Verlin Lonni BIRCH, MD;  Location: [facility] INVASIVE CV LAB;  Service: Cardiovascular;  Laterality: N/A;   POLYPECTOMY     TOTAL ABDOMINAL HYSTERECTOMY  UPPER GASTROINTESTINAL ENDOSCOPY      Family History  Problem Relation Age of Onset   Hypertension Mother    Rheum arthritis Mother    Hypertension Father    Diabetes Father    Prostate cancer Father    Kidney disease Father    Diabetes Sister    Fibromyalgia Sister    Diabetes Maternal Grandmother    Lung cancer Maternal Grandfather    Arthritis Brother    Migraines Brother    CAD Brother    Fibromyalgia Sister    Migraines Sister    Colon cancer Neg Hx    Thyroid  disease Neg Hx    Colon polyps Neg Hx     Social History   Socioeconomic History   Marital status: Widowed    Spouse name: Not on file   Number of children: 2   Years of education: Not on file   Highest education level: Not on file  Occupational History   Occupation: DISABILITY    Employer: UNEMPLOYED   Tobacco Use   Smoking status: Former    Current packs/day: 0.00    Average packs/day: 0.5 packs/day for 18.0 years (9.0 ttl pk-yrs)    Types: Cigarettes    Start date: 07/29/1977    Quit date: 06/25/1994    Years since quitting: 29.8   Smokeless tobacco: Never   Tobacco comments:    quit 19 years ago  Vaping Use   Vaping status: Never Used  Substance and Sexual Activity   Alcohol use: Yes    Alcohol/week: 0.0 standard drinks of alcohol    Comment: social drinker   Drug use: No   Sexual activity: Not Currently    Birth control/protection: Post-menopausal, Surgical  Other Topics Concern   Not on file  Social History Narrative   Widowed   Has 2 grown children.  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    Former Smoker Quit tobacco in 1996.  She was a pack a day smoker for approximately 10 years.    Alcohol use-yes: Social    Daily Caffeine Use:6 pack of pepsi daily     Illicit Drug Use - no    Patient does not get regular exercise.       Smoking Status:  quit      Right handed   Living alone, 5 steps to up entrance of the house   Retired    Social Drivers of [employer] Strain: Low Risk  (01/16/2023)   Overall Financial Resource Strain (CARDIA)    Difficulty of Paying Living Expenses: Not hard at all  Food Insecurity: No Food Insecurity (01/16/2023)   Hunger Vital Sign    Worried About Running Out of Food in the Last Year: Never true    Ran Out of Food in the Last Year: Never true  Transportation Needs: No Transportation Needs (01/16/2023)   PRAPARE - Administrator, Civil Service (Medical): No    Lack of Transportation (Non-Medical): No  Physical Activity: Inactive (06/10/2023)   Exercise Vital Sign    Days of Exercise per Week: 0 days    Minutes of Exercise per Session: 0 min  Stress: No Stress Concern Present (01/16/2023)   [employer] of Occupational Health - Occupational Stress Questionnaire     Feeling of Stress : Not at all  Social Connections: Moderately Isolated (06/10/2023)   Social Connection and Isolation Panel    Frequency of Communication with Friends  and Family: More than three times a week    Frequency of Social Gatherings with Friends and Family: More than three times a week    Attends Religious Services: More than 4 times per year    Active Member of Clubs or Organizations: No    Attends Banker Meetings: Never    Marital Status: Widowed  Intimate Partner Violence: Not At Risk (06/08/2022)   Humiliation, Afraid, Rape, and Kick questionnaire    Fear of Current or Ex-Partner: No    Emotionally Abused: No    Physically Abused: No    Sexually Abused: No    Outpatient Medications Prior to Visit  Medication Sig Dispense Refill   Accu-Chek Softclix Lancets lancets 3 (three) times daily.     albuterol  (VENTOLIN  HFA) 108 (90 Base) MCG/ACT inhaler INHALE 2 PUFFS BY MOUTH 4 TIMES DAILY 9 g 0   alclomethasone (ACLOVATE ) 0.05 % cream Apply topically 2 (two) times daily as needed (Rash). 180 g 3   amLODipine  (NORVASC ) 10 MG tablet Take 1 tablet (10 mg total) by mouth daily. 90 tablet 1   aspirin  81 MG chewable tablet Chew 1 tablet (81 mg total) by mouth daily.     Blood Glucose Monitoring Suppl DEVI 1 each by Does not apply route in the morning, at noon, and at bedtime. May substitute to any manufacturer covered by patient's insurance. 1 each 0   chlorthalidone  (HYGROTON ) 25 MG tablet Take 1 tablet (25 mg total) by mouth daily. 90 tablet 3   ciprofloxacin -dexamethasone  (CIPRODEX ) OTIC suspension 4 drops to right ear twice daily     clobetasol  (TEMOVATE ) 0.05 % external solution Apply 1 application topically 2 (two) times daily. 50 mL 6   DULoxetine  (CYMBALTA ) 60 MG capsule Take 1 capsule (60 mg total) by mouth daily. 90 capsule 1   Evolocumab  (REPATHA  SURECLICK) 140 MG/ML SOAJ INJECT 140 MG INTO THE SKIN EVERY 14 DAYS 6 mL 3   FARXIGA  5 MG TABS tablet Take 1  tablet (5 mg total) by mouth daily before breakfast. 90 tablet 1   fluticasone  (FLONASE ) 50 MCG/ACT nasal spray Place 2 sprays into both nostrils daily. 16 g 1   furosemide  (LASIX ) 20 MG tablet Take 1 tablet (20 mg total) by mouth daily as needed (for shortness of breath or weight gain of 2 lbs overnight or 5 lbs in a week). 45 tablet 3   glucose blood (ACCU-CHEK GUIDE TEST) test strip USE 1 EACH BY IN VITRO ROUTE IN ETH MORNING, AT NOON, AND AT BEDTIME 100 each 2   isosorbide  mononitrate (IMDUR ) 60 MG 24 hr tablet Take 1.5 tablets (90 mg total) by mouth daily. 135 tablet 2   loratadine  (CLARITIN ) 10 MG tablet Take 1 tablet (10 mg total) by mouth daily. 10 tablet 0   meclizine  (ANTIVERT ) 12.5 MG tablet Take 1 tablet (12.5 mg total) by mouth 3 (three) times daily as needed for dizziness. 30 tablet 0   meloxicam  (MOBIC ) 7.5 MG tablet Take 1 tablet (7.5 mg total) by mouth daily. 30 tablet 5   methimazole  (TAPAZOLE ) 5 MG tablet Take 5 mg every other day 30 tablet 5   methocarbamol  (ROBAXIN ) 500 MG tablet Take 1 tablet (500 mg total) by mouth at bedtime as needed for muscle spasms. 30 tablet 2   metoCLOPramide  (REGLAN ) 5 MG tablet TAKE 1 TABLET BY MOUTH THREE TIMES DAILY BEFORE MEAL(S). 90 tablet 0   Multiple Vitamins-Minerals (MULTIVITAMIN WITH MINERALS) tablet Take 1 tablet by mouth daily.  ondansetron  (ZOFRAN -ODT) 4 MG disintegrating tablet Take 1 tablet (4 mg total) by mouth every 8 (eight) hours as needed for nausea or vomiting. 60 tablet 1   pantoprazole  (PROTONIX ) 40 MG tablet Take 1 tablet (40 mg total) by mouth 2 (two) times daily.     potassium chloride  SA (KLOR-CON  M20) 20 MEQ tablet Take 2 tablets (40 mEq total) by mouth daily. 180 tablet 3   rosuvastatin  (CRESTOR ) 40 MG tablet Take 1 tablet (40 mg total) by mouth daily. 90 tablet 1   SUMAtriptan  (IMITREX ) 50 MG tablet Take 50 mg by mouth daily as needed for migraine.     tretinoin  (RETIN-A ) 0.05 % cream Apply topically at bedtime. apply  pea size (1gm) amount to face 2-3 nights a week. 45 g 0   Vitamin D , Ergocalciferol , (DRISDOL ) 1.25 MG (50000 UNIT) CAPS capsule Take 1 capsule (50,000 Units total) by mouth every 7 (seven) days. 12 capsule 0   XIIDRA 5 % SOLN Apply 1 drop to eye 2 (two) times daily.     No facility-administered medications prior to visit.    Allergies  Allergen Reactions   Celebrex [Celecoxib] Other (See Comments)    makes me bleed   Diovan  [Valsartan ] Other (See Comments)    angioedema   Metformin  Anxiety    Vomiting, Anxiety, Cramping, Diarrhea   Ace Inhibitors Cough    ROS See HPI    Objective:    Physical Exam Constitutional:      General: She is not in acute distress.    Appearance: Normal appearance. She is well-developed.  HENT:     Head: Normocephalic and atraumatic.     Right Ear: External ear normal.     Left Ear: External ear normal.  Eyes:     General: No scleral icterus. Neck:     Thyroid : No thyromegaly.  Cardiovascular:     Rate and Rhythm: Normal rate and regular rhythm.     Heart sounds: Normal heart sounds. No murmur heard. Pulmonary:     Effort: Pulmonary effort is normal. No respiratory distress.     Breath sounds: Normal breath sounds. No wheezing.  Musculoskeletal:     Cervical back: Neck supple.  Skin:    General: Skin is warm and dry.  Neurological:     Mental Status: She is alert and oriented to person, place, and time.  Psychiatric:        Mood and Affect: Mood normal.        Behavior: Behavior normal.        Thought Content: Thought content normal.        Judgment: Judgment normal.      BP 134/67 (BP Location: Right Arm, Patient Position: Sitting, Cuff Size: Normal)   Pulse 61   Temp 98 F (36.7 C) (Oral)   Resp 16   Ht 5' 4 (1.626 m)   Wt 218 lb 9.6 oz (99.2 kg)   SpO2 100%   BMI 37.52 kg/m  Wt Readings from Last 3 Encounters:  04/24/24 218 lb 9.6 oz (99.2 kg)  04/20/24 216 lb (98 kg)  04/20/24 216 lb (98 kg)       Assessment &  Plan:   Problem List Items Addressed This Visit       Unprioritized   Iron  deficiency anemia   Serum iron  level was normal last month- only mildly anemic. Continue MVI with minerals.      Hyperthyroidism   Lab Results  Component Value Date   TSH 2.33  04/06/2024   TSH WNL, maintained on tapazole  per Endo.       GERD   She was noted to have some esophageal thickening on CT- she did have an endoscopy in September which was normal with normal biopsies.       Fatigue   Good compliance with Bipap. I encouraged her to follow up with cardiology re: fatigue since they are managing her bipap.  I will check a b12 level since it has not been checked in some time and and has been borderline low in the past.  I do think that she simply is not getting enough sleep and she will try to work on this.       Bradycardia   HR was in the 40's when she went to the ER.  HR 61 today- asymptomatic, on imdur  but no rate limiting drugs. Monitor.       Other Visit Diagnoses       B12 deficiency    -  Primary   Relevant Orders   B12 and Folate Panel       I am having [name] maintain her aspirin , loratadine , fluticasone , alclomethasone, clobetasol , albuterol , Xiidra, furosemide , Blood Glucose Monitoring Suppl, Accu-Chek Softclix Lancets, chlorthalidone , potassium chloride  SA, meclizine , SUMAtriptan , Accu-Chek Guide Test, multivitamin with minerals, ciprofloxacin -dexamethasone , DULoxetine , methocarbamol , Repatha  SureClick, pantoprazole , ondansetron , tretinoin , Vitamin D  (Ergocalciferol ), meloxicam , amLODipine , methimazole , rosuvastatin , metoCLOPramide , isosorbide  mononitrate, and Farxiga .  No orders of the defined types were placed in this encounter.

## 2024-04-24 NOTE — Patient Instructions (Signed)
 VISIT SUMMARY:  Today, we discussed your ongoing issues with fatigue and sleep disturbances, which may be related to your sleep apnea and BiPAP machine settings. We also reviewed your recent ER visit, blood sugar levels, and chronic anemia.  YOUR PLAN:  OBSTRUCTIVE SLEEP APNEA: You are experiencing difficulty with sleep initiation and maintenance, which is contributing to your fatigue. Your BiPAP machine settings may need adjustment. -Contact Dr. Abigail to discuss BiPAP settings and persistent fatigue.  FATIGUE: Your persistent fatigue is likely due to sleep disturbances and possible suboptimal BiPAP settings. -Work on improving sleep quality. -Adjust BiPAP settings if necessary.  BRADYCARDIA: Your current heart rate is 61 bpm, and there are no medication causes. Your recent ER visit ruled out acute cardiac issues. -No immediate action required.  TYPE 2 DIABETES MELLITUS: Your recent blood glucose levels are in the diabetic range. We are awaiting documentation of your recent eye exam. -Obtain a copy of your recent eye exam from [facility].  IRON  DEFICIENCY ANEMIA, CHRONIC: You have chronic mild anemia that is stable with no acute changes. -No immediate action required.

## 2024-04-24 NOTE — Assessment & Plan Note (Signed)
 Good compliance with Bipap. I encouraged her to follow up with cardiology re: fatigue since they are managing her bipap.  I will check a b12 level since it has not been checked in some time and and has been borderline low in the past.  I do think that she simply is not getting enough sleep and she will try to work on this.

## 2024-04-24 NOTE — Assessment & Plan Note (Signed)
 She was noted to have some esophageal thickening on CT- she did have an endoscopy in September which was normal with normal biopsies.

## 2024-04-24 NOTE — Telephone Encounter (Signed)
 Request faxed to Dr. Ladora

## 2024-04-24 NOTE — Assessment & Plan Note (Addendum)
 Lab Results  Component Value Date   TSH 2.33 04/06/2024   TSH WNL, maintained on tapazole  per Endo.

## 2024-04-24 NOTE — Assessment & Plan Note (Signed)
 HR was in the 40's when she went to the ER.  HR 61 today- asymptomatic, on imdur  but no rate limiting drugs. Monitor.

## 2024-04-24 NOTE — Assessment & Plan Note (Signed)
 Serum iron  level was normal last month- only mildly anemic. Continue MVI with minerals.

## 2024-04-27 ENCOUNTER — Ambulatory Visit: Payer: Self-pay | Admitting: Family

## 2024-04-27 DIAGNOSIS — E538 Deficiency of other specified B group vitamins: Secondary | ICD-10-CM | POA: Insufficient documentation

## 2024-04-27 NOTE — Progress Notes (Signed)
 Called pt to set up B12 injection, unable to leave vm Please schedule pt when she calls back.

## 2024-04-27 NOTE — Telephone Encounter (Signed)
 B12 is low, please start b12 1000 mcg IM weekly x 4 then monthly.

## 2024-04-29 NOTE — Progress Notes (Signed)
 Patient notified of results and scheduled for B12 injections for 4 weeks on Tuesdays starting on 05/05/24

## 2024-05-05 ENCOUNTER — Ambulatory Visit (INDEPENDENT_AMBULATORY_CARE_PROVIDER_SITE_OTHER)

## 2024-05-05 DIAGNOSIS — E538 Deficiency of other specified B group vitamins: Secondary | ICD-10-CM

## 2024-05-05 MED ORDER — CYANOCOBALAMIN 1000 MCG/ML IJ SOLN
1000.0000 ug | Freq: Once | INTRAMUSCULAR | Status: AC
  Administered 2024-05-05: 1000 ug via INTRAMUSCULAR

## 2024-05-05 NOTE — Progress Notes (Signed)
 Pt here for monthly B12 injection per original order dated: 04/24/2024  Last B12 injection:  Last B12 level:  04/24/2024  B12 1000mcg given IM, and pt tolerated injection well.  Next B12 injection scheduled for:  next week

## 2024-05-06 ENCOUNTER — Ambulatory Visit: Admitting: Dermatology

## 2024-05-12 ENCOUNTER — Ambulatory Visit (INDEPENDENT_AMBULATORY_CARE_PROVIDER_SITE_OTHER)

## 2024-05-12 DIAGNOSIS — E538 Deficiency of other specified B group vitamins: Secondary | ICD-10-CM | POA: Diagnosis not present

## 2024-05-12 MED ORDER — CYANOCOBALAMIN 1000 MCG/ML IJ SOLN
1000.0000 ug | Freq: Once | INTRAMUSCULAR | Status: AC
  Administered 2024-05-12: 1000 ug via INTRAMUSCULAR

## 2024-05-12 NOTE — Progress Notes (Signed)
 Pt here for weekly B12 injection per original order dated: Melissa O'Sullivan,NP B12 is low, please start b12 1000 mcg IM weekly x 4 then monthly.   Last B12 injection:05/05/2024  Last B12 level:04/24/2024    B12 1000mcg given IM, left deltoid and pt tolerated injection well.  Next B12 injection scheduled for: 05/19/2024

## 2024-05-19 ENCOUNTER — Ambulatory Visit (INDEPENDENT_AMBULATORY_CARE_PROVIDER_SITE_OTHER)

## 2024-05-19 DIAGNOSIS — E538 Deficiency of other specified B group vitamins: Secondary | ICD-10-CM | POA: Diagnosis not present

## 2024-05-19 MED ORDER — CYANOCOBALAMIN 1000 MCG/ML IJ SOLN
1000.0000 ug | Freq: Once | INTRAMUSCULAR | Status: AC
  Administered 2024-05-19: 1000 ug via INTRAMUSCULAR

## 2024-05-19 NOTE — Progress Notes (Signed)
 Pt here for weekly B12 injection per original order dated: Melissa O'Sullivan,NP B12 is low, please start b12 1000 mcg IM weekly x 4 then monthly.  Last B12 injection:05/12/2024  Last B12 level: 04/24/2024   B12 1000mcg given IM, right deltoid and pt tolerated injection well.  Next B12 injection scheduled for: 05/26/2024

## 2024-05-26 ENCOUNTER — Ambulatory Visit

## 2024-05-26 DIAGNOSIS — E538 Deficiency of other specified B group vitamins: Secondary | ICD-10-CM

## 2024-05-26 MED ORDER — CYANOCOBALAMIN 1000 MCG/ML IJ SOLN
1000.0000 ug | Freq: Once | INTRAMUSCULAR | Status: AC
  Administered 2024-05-26: 1000 ug via INTRAMUSCULAR

## 2024-05-26 NOTE — Progress Notes (Signed)
 Pt here for 4/4 weekly B12 injection per PCP Last B12 injection: 05/19/2024  Last B12 level:  04/24/2024  B12 1000mcg given IM L deltoid, and pt tolerated injection well.  Next B12 injection scheduled for: 06/30/2024

## 2024-05-29 ENCOUNTER — Ambulatory Visit: Admitting: Family

## 2024-05-29 NOTE — Progress Notes (Deleted)
 Cardiology Office Note   Date:  05/29/2024  ID:  [name], DOB 1961-03-17, MRN [id number] PCP: Daryl Setter, NP  [facility] Providers Cardiologist:  Vina Gull, MD   History of Present Illness [name] is a 63 y.o. female with a past medical history of nonobstructive CAD, longstanding chest pain, esophageal stricture, diabetes mellitus type 2, endometriosis, HTN, inability to use ACE/ARB, HLD, thyroid  disease, fatty liver, IDA, migraine, borderline dilation of aortic root not seen on subsequent study, liver hemangioma, hiatal hernia, sleep apnea by 2024 sleep study, carotid artery disease by CTA 07/2022, epidermodysplasia verruciformis here for follow-up appointment.  Had episodic evaluations (cardiac catheterization/stress/CTA) for chronic chest pain over the years dating back to 2009.  Last cardiac cath 08/2021 showed mild nonobstructive disease of the LAD and circumflex, moderate intermediate branch unchanged from 2019, small caliber PDA too small for PCI, manage medically.  She was started on Lasix  as needed by APP 07/2022 due to mild volume overload in the setting of obesity.  Coronary CTA 08/2022 showed CAC 1504, 99th percentile, nonobstructive CAD also mention aortic root calcifications, moderate mixed plaque in the proximal RCA, mild diffuse mid distal RCA, minimal LAD plaque with short area of mid to distal LAD with myocardial bridging, mild calcification in the proximal to mid LAD also incidentally noted circumferential thickening of the esophagus, underlying mass not excluded similar to prior CT.  Endoscopy 03/2022 for central previous abnormal CT findings had shown 1 cm HH, benign-appearing blebs in the lower esophagus and dilation of esophagus, mucosal changes of the duodenum.  ER visit 07/2023 fleeting sharp sided left chest pain with left arm discomfort with mixed features.  She had negative troponins and reassuring EKG.  CTA of the head/neck 07/2023 for  suspected peripheral dizziness showed atheromatous changes about the carotid siphons with associated moderate to severe stenosis bilaterally, aortic atherosclerosis.  Office visit with Lonell Bring, PA-C reportedly elevated blood pressures at that visit so chlorthalidone  was added.  She cannot take ACE or ARB (cough and angioedema).  Heart rate had been in the 50s precluding addition of beta-blocker.  Echo done 08/2023 for surveillance of aortic root size showing EF 67%, G1 DD, normal aortic size.  Cardiac PET 10/2023 for continued chest pain was low risk without evidence of ischemia, EF 59%.  Overread continues showed thickening in the distal esophagus and hepatic steatosis so she was advised to follow-up with GI.  Was seen by ENT for vertigo and had ear tubes placed without significant change or persistent dizziness.  This persist despite normal VS/stable EKG.  Referred to neurology given previous cerebrovascular disease seen on imaging.  At her last office visit 12/23/2023 she been feeling about the same.  She been playing phone tag with GI unfortunately.  She continued to have episodic nonexertional chest pain in various locations lasting 3 to 20 minutes relieved spontaneously without anything in particular.  There is no particular pattern to provocation.  She has had to take her as needed Lasix  few times over the past week due to DOE.  Was feeling well at the time of her last visit.  Today, she***  ROS: Pertinent ROS in HPI  Studies Reviewed     NM PET/CT coronary perfusion study 11/20/2023  Narrative & Impression      The study is normal. The study is low risk.   LV perfusion is normal. There is no evidence of ischemia. There is no evidence of infarction.   Rest left ventricular function  is normal. Rest EF: 59%. Stress left ventricular function is normal. Stress EF: 68%. End diastolic cavity size is normal. End systolic cavity size is normal.   Myocardial blood flow was computed to be  0.57ml/g/min at rest and 2.15ml/g/min at stress. Global myocardial blood flow reserve was 2.24 and was normal.   Coronary calcium  was present on the attenuation correction CT images. Severe coronary calcifications were present. Coronary calcifications were present in the left anterior descending artery, left circumflex artery and right coronary artery distribution(s). Aortic atherosclerosis noted.  Aortic valve calcification noted.   Electronically Signed  By: Stanly Leavens M.D.   EXAM: OVER-READ INTERPRETATION  PET-CT CHEST   The following report is an over-read performed by radiologist Dr. Camellia Lang [facility] on 11/20/2023. This over-read does not include interpretation of cardiac or coronary anatomy or pathology. The cardiac PET and cardiac CT interpretation by the cardiologist is to be attached.   COMPARISON:  Abdomen pelvis CT 03/02/2022   FINDINGS: No evidence for lymphadenopathy within the visualized mediastinum or hilar regions. Circumferential wall thickening noted distal esophagus, similar to prior study.   The visualized lung parenchyma shows no suspicious pulmonary nodule or mass. No focal airspace consolidation. No effusion.   Diffusely decreased attenuation in the right hepatic lobe with relative sparing of the left lobe suggest fatty deposition.   No worrisome lytic or sclerotic osseous abnormality.   IMPRESSION: 1. Circumferential wall thickening in the distal esophagus, similar to prior study. Esophagitis would be a consideration. 2. Hepatic steatosis.     Electronically Signed   By: Camellia Candle M.D.   On: 11/20/2023 08:20   Risk Assessment/Calculations {Does this patient have ATRIAL FIBRILLATION?:[id number]} No BP recorded.  {Refresh Note OR Click here to enter BP  :1}***       Physical Exam VS:  There were no vitals taken for this visit.       Wt Readings from Last 3 Encounters:  04/24/24 218 lb 9.6 oz (99.2 kg)  04/20/24  216 lb (98 kg)  04/20/24 216 lb (98 kg)    GEN: Well nourished, well developed in no acute distress NECK: No JVD; No carotid bruits CARDIAC: ***RRR, no murmurs, rubs, gallops RESPIRATORY:  Clear to auscultation without rales, wheezing or rhonchi  ABDOMEN: Soft, non-tender, non-distended EXTREMITIES:  No edema; No deformity   ASSESSMENT AND PLAN Precordial pain, CAD HLD Dyspnea on HTN Sleep apnea Carotid artery disease    {Are you ordering a CV Procedure (e.g. stress test, cath, DCCV, TEE, etc)?   Press F2        :[id number]}  Dispo: ***  Signed, Orren LOISE Fabry, PA-C

## 2024-06-02 ENCOUNTER — Ambulatory Visit: Admitting: Physician Assistant

## 2024-06-02 DIAGNOSIS — I10 Essential (primary) hypertension: Secondary | ICD-10-CM

## 2024-06-02 DIAGNOSIS — R0602 Shortness of breath: Secondary | ICD-10-CM

## 2024-06-02 DIAGNOSIS — G4730 Sleep apnea, unspecified: Secondary | ICD-10-CM

## 2024-06-02 DIAGNOSIS — G4733 Obstructive sleep apnea (adult) (pediatric): Secondary | ICD-10-CM

## 2024-06-02 DIAGNOSIS — R072 Precordial pain: Secondary | ICD-10-CM

## 2024-06-02 DIAGNOSIS — I6523 Occlusion and stenosis of bilateral carotid arteries: Secondary | ICD-10-CM

## 2024-06-02 DIAGNOSIS — E785 Hyperlipidemia, unspecified: Secondary | ICD-10-CM

## 2024-06-02 DIAGNOSIS — I2510 Atherosclerotic heart disease of native coronary artery without angina pectoris: Secondary | ICD-10-CM

## 2024-06-10 ENCOUNTER — Ambulatory Visit: Payer: 59

## 2024-06-10 VITALS — Ht 64.0 in | Wt 216.0 lb

## 2024-06-10 DIAGNOSIS — Z0000 Encounter for general adult medical examination without abnormal findings: Secondary | ICD-10-CM | POA: Diagnosis not present

## 2024-06-10 DIAGNOSIS — Z1231 Encounter for screening mammogram for malignant neoplasm of breast: Secondary | ICD-10-CM

## 2024-06-10 DIAGNOSIS — Z780 Asymptomatic menopausal state: Secondary | ICD-10-CM | POA: Diagnosis not present

## 2024-06-10 NOTE — Patient Instructions (Addendum)
 [name],  Thank you for taking the time for your Medicare Wellness Visit. I appreciate your continued commitment to your health goals. Please review the care plan we discussed, and feel free to reach out if I can assist you further.  Please note that Annual Wellness Visits do not include a physical exam. Some assessments may be limited, especially if the visit was conducted virtually. If needed, we may recommend an in-person follow-up with your provider.  GOAL: To restart exercising at the [facility]  Ongoing Care Seeing your primary care provider every 3 to 6 months helps us  monitor your health and provide consistent, personalized care.   Eleanor Ponto, NP: 07/10/24 1:40pm Medicare AWV: 06/11/25 11am, telephone  Referrals If a referral was made during today's visit and you haven't received any updates within two weeks, please contact the referred provider directly to check on the status.  Mammogram (due after 08/28/24) / Bone Density (due now) [facility]:  [phone]  Diabetic Eye Exam:  Please schedule as soon as possible.  Recommended Screenings: You will need to get the following vaccines at your local pharmacy: 3rd Hepatitis B vaccine  Health Maintenance  Topic Date Due   Eye exam for diabetics  Never done   COVID-19 Vaccine (5 - 2025-26 season) 02/24/2024   Hepatitis B Vaccine (3 of 3 - 19+ 3-dose series) 06/07/2024   Medicare Annual Wellness Visit  06/09/2024   Hemoglobin A1C  09/10/2024   Complete foot exam   12/05/2024   Yearly kidney health urinalysis for diabetes  03/13/2025   Yearly kidney function blood test for diabetes  04/20/2025   Breast Cancer Screening  08/28/2025   Colon Cancer Screening  07/17/2027   DTaP/Tdap/Td vaccine (4 - Td or Tdap) 11/14/2032   Pneumococcal Vaccine for age over 41  Completed   Flu Shot  Completed   Osteoporosis screening with Bone Density Scan  Completed   Hepatitis C Screening  Completed   HIV Screening  Completed    Zoster (Shingles) Vaccine  Completed   Meningitis B Vaccine  Aged Out       06/10/2024    9:02 AM  Advanced Directives  Does Patient Have a Medical Advance Directive? No  Would patient like information on creating a medical advance directive? Yes (MAU/Ambulatory/Procedural Areas - Information given)  Please let me know if you do not receive your Advanced Directive Packet within 1 week. Once completed and notarized, you may return a copy of your Advanced Directive(s) by either of the following:  Bring a copy of your health care power of attorney and living will to the office to be added to your chart at your convenience. You can mail a copy to [facility] [address] [place], [place] [address] or email to [email or web address]    Vision: Annual vision screenings are recommended for early detection of glaucoma, cataracts, and diabetic retinopathy. These exams can also reveal signs of chronic conditions such as diabetes and high blood pressure.  Dental: Annual dental screenings help detect early signs of oral cancer, gum disease, and other conditions linked to overall health, including heart disease and diabetes.  Please see the attached documents for additional preventive care recommendations.

## 2024-06-10 NOTE — Progress Notes (Signed)
 Chief Complaint  Patient presents with   Medicare Wellness     Subjective:   [name] is a 63 y.o. female who presents for a Medicare Annual Wellness Visit.  Visit info / Clinical Intake: Medicare Wellness Visit Type:: Subsequent Annual Wellness Visit Persons participating in visit and providing information:: patient Medicare Wellness Visit Mode:: Telephone If telephone:: video declined Since this visit was completed virtually, some vitals may be partially provided or unavailable. Missing vitals are due to the limitations of the virtual format.: Unable to obtain vitals - no equipment If Telephone or Video please confirm:: I connected with patient using audio/video enable telemedicine. I verified patient identity with two identifiers, discussed telehealth limitations, and patient agreed to proceed. Patient Location:: home Provider Location:: office Interpreter Needed?: No Pre-visit prep was completed: yes AWV questionnaire completed by patient prior to visit?: no Living arrangements:: (!) lives alone Patient's Overall Health Status Rating: excellent Typical amount of pain: some Does pain affect daily life?: no Are you currently prescribed opioids?: no  Dietary Habits and Nutritional Risks How many meals a day?: 2 Eats fruit and vegetables daily?: yes Most meals are obtained by: preparing own meals In the last 2 weeks, have you had any of the following?: (!) nausea, vomiting, diarrhea (has daily nausea due to gastroparesis) Diabetic:: (!) yes Any non-healing wounds?: no How often do you check your BS?: 2 Would you like to be referred to a Nutritionist or for Diabetic Management? : no  Functional Status Activities of Daily Living (to include ambulation/medication): Independent Ambulation: Independent Medication Administration: Independent Home Management (perform basic housework or laundry): Independent Manage your own finances?: yes Primary transportation is:  driving Concerns about vision?: no *vision screening is required for WTM* (past due at [facility] on [address], will call to schedule) Concerns about hearing?: no (has tubes in ears)  Fall Screening Falls in the past year?: 0 Number of falls in past year: 0 Was there an injury with Fall?: 0 Fall Risk Category Calculator: 0 Patient Fall Risk Level: Low Fall Risk  Fall Risk Patient at Risk for Falls Due to: Orthopedic patient Fall risk Follow up: Falls evaluation completed  Home and Transportation Safety: All rugs have non-skid backing?: N/A, no rugs All stairs or steps have railings?: N/A, no stairs Grab bars in the bathtub or shower?: (!) no Have non-skid surface in bathtub or shower?: yes Good home lighting?: yes Regular seat belt use?: yes Hospital stays in the last year:: no  Cognitive Assessment Difficulty concentrating, remembering, or making decisions? : no Will 6CIT or Mini Cog be Completed: yes What year is it?: 0 points What month is it?: 0 points Give patient an address phrase to remember (5 components): [address] [place]  About what time is it?: 0 points Count backwards from 20 to 1: 0 points Say the months of the year in reverse: 0 points Repeat the address phrase from earlier: 2 points 6 CIT Score: 2 points  Advance Directives (For Healthcare) Does Patient Have a Medical Advance Directive?: No Type of Advance Directive: Healthcare Power of [place]; Living will Would patient like information on creating a medical advance directive?: Yes (MAU/Ambulatory/Procedural Areas - Information given)  Reviewed/Updated  Reviewed/Updated: Reviewed All (Medical, Surgical, Family, Medications, Allergies, Care Teams, Patient Goals)    Allergies (verified) Celebrex [celecoxib], Diovan  [valsartan ], Metformin , and Ace inhibitors   Current Medications (verified) Outpatient Encounter Medications as of 06/10/2024  Medication Sig   Accu-Chek Softclix Lancets lancets 3  (three) times  daily.   albuterol  (VENTOLIN  HFA) 108 (90 Base) MCG/ACT inhaler INHALE 2 PUFFS BY MOUTH 4 TIMES DAILY   alclomethasone (ACLOVATE ) 0.05 % cream Apply topically 2 (two) times daily as needed (Rash).   amLODipine  (NORVASC ) 10 MG tablet Take 1 tablet (10 mg total) by mouth daily.   aspirin  81 MG chewable tablet Chew 1 tablet (81 mg total) by mouth daily.   Blood Glucose Monitoring Suppl DEVI 1 each by Does not apply route in the morning, at noon, and at bedtime. May substitute to any manufacturer covered by patient's insurance.   chlorthalidone  (HYGROTON ) 25 MG tablet Take 1 tablet (25 mg total) by mouth daily.   clobetasol  (TEMOVATE ) 0.05 % external solution Apply 1 application topically 2 (two) times daily.   DULoxetine  (CYMBALTA ) 60 MG capsule Take 1 capsule (60 mg total) by mouth daily.   Evolocumab  (REPATHA  SURECLICK) 140 MG/ML SOAJ INJECT 140 MG INTO THE SKIN EVERY 14 DAYS   FARXIGA  5 MG TABS tablet Take 1 tablet (5 mg total) by mouth daily before breakfast.   fluticasone  (FLONASE ) 50 MCG/ACT nasal spray Place 2 sprays into both nostrils daily.   furosemide  (LASIX ) 20 MG tablet Take 1 tablet (20 mg total) by mouth daily as needed (for shortness of breath or weight gain of 2 lbs overnight or 5 lbs in a week).   glucose blood (ACCU-CHEK GUIDE TEST) test strip USE 1 EACH BY IN VITRO ROUTE IN ETH MORNING, AT NOON, AND AT BEDTIME   isosorbide  mononitrate (IMDUR ) 60 MG 24 hr tablet Take 1.5 tablets (90 mg total) by mouth daily.   loratadine  (CLARITIN ) 10 MG tablet Take 1 tablet (10 mg total) by mouth daily.   meclizine  (ANTIVERT ) 12.5 MG tablet Take 1 tablet (12.5 mg total) by mouth 3 (three) times daily as needed for dizziness.   meloxicam  (MOBIC ) 7.5 MG tablet Take 1 tablet (7.5 mg total) by mouth daily.   methimazole  (TAPAZOLE ) 5 MG tablet Take 5 mg every other day   methocarbamol  (ROBAXIN ) 500 MG tablet Take 1 tablet (500 mg total) by mouth at bedtime as needed for muscle spasms.    metoCLOPramide  (REGLAN ) 5 MG tablet TAKE 1 TABLET BY MOUTH THREE TIMES DAILY BEFORE MEAL(S).   Multiple Vitamins-Minerals (MULTIVITAMIN WITH MINERALS) tablet Take 1 tablet by mouth daily.   ondansetron  (ZOFRAN -ODT) 4 MG disintegrating tablet Take 1 tablet (4 mg total) by mouth every 8 (eight) hours as needed for nausea or vomiting.   pantoprazole  (PROTONIX ) 40 MG tablet Take 1 tablet (40 mg total) by mouth 2 (two) times daily.   potassium chloride  SA (KLOR-CON  M20) 20 MEQ tablet Take 2 tablets (40 mEq total) by mouth daily.   rosuvastatin  (CRESTOR ) 40 MG tablet Take 1 tablet (40 mg total) by mouth daily.   SUMAtriptan  (IMITREX ) 50 MG tablet Take 50 mg by mouth daily as needed for migraine.   tretinoin  (RETIN-A ) 0.05 % cream Apply topically at bedtime. apply pea size (1gm) amount to face 2-3 nights a week.   Vitamin D , Ergocalciferol , (DRISDOL ) 1.25 MG (50000 UNIT) CAPS capsule Take 1 capsule (50,000 Units total) by mouth every 7 (seven) days.   XIIDRA 5 % SOLN Apply 1 drop to eye 2 (two) times daily.   [DISCONTINUED] ciprofloxacin -dexamethasone  (CIPRODEX ) OTIC suspension 4 drops to right ear twice daily   No facility-administered encounter medications on file as of 06/10/2024.    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- benign   Iron  deficiency anemia    Migraine headache    Non-compliance    Peptic ulcer disease    Sleep apnea    Thyroid  disease    Past Surgical History:  Procedure Laterality Date    ABDOMINAL EXPLORATION SURGERY     w/bso    BREAST BIOPSY     CARDIAC CATHETERIZATION  2009   mild non obstructive CAD   CHOLECYSTECTOMY     COLONOSCOPY     ESOPHAGEAL MANOMETRY N/A 08/01/2020   Procedure: ESOPHAGEAL MANOMETRY (EM);  Surgeon: Leigh Elspeth SQUIBB, MD;  Location: [facility];  Service: Gastroenterology;  Laterality: N/A;   KNEE SURGERY  2005    left knee   LEFT HEART CATH AND CORONARY ANGIOGRAPHY N/A 07/17/2017   Procedure: LEFT HEART CATH AND CORONARY ANGIOGRAPHY;  Surgeon: Jordan, Peter M, MD;  Location: [facility] INVASIVE CV LAB;  Service: Cardiovascular;  Laterality: N/A;   LEFT HEART CATH AND CORONARY ANGIOGRAPHY N/A 09/01/2021   Procedure: LEFT HEART CATH AND CORONARY ANGIOGRAPHY;  Surgeon: Verlin Lonni BIRCH, MD;  Location: [facility] INVASIVE CV LAB;  Service: Cardiovascular;  Laterality: N/A;   POLYPECTOMY     TOTAL ABDOMINAL HYSTERECTOMY     UPPER GASTROINTESTINAL ENDOSCOPY     Family History  Problem Relation Age of Onset   Hypertension Mother    Rheum arthritis Mother    Hypertension Father    Diabetes Father    Prostate cancer Father    Kidney disease Father    Diabetes Sister    Fibromyalgia Sister    Diabetes Maternal Grandmother    Lung cancer Maternal Grandfather    Arthritis Brother    Migraines Brother    CAD Brother    Fibromyalgia Sister    Migraines Sister    Colon cancer Neg Hx    Thyroid  disease Neg Hx    Colon polyps Neg Hx    Social History   Occupational History   Occupation: DISABILITY    Employer: UNEMPLOYED  Tobacco Use   Smoking status: Former    Current packs/day: 0.00    Average packs/day: 0.5 packs/day for 18.0 years (9.0 ttl pk-yrs)    Types: Cigarettes    Start date: 07/29/1977    Quit date: 06/25/1994    Years since quitting: 29.9   Smokeless tobacco: Never   Tobacco comments:    quit 19 years ago  Vaping Use   Vaping status: Never Used  Substance and Sexual Activity   Alcohol use: Yes    Alcohol/week: 0.0 standard drinks  of alcohol    Comment: social drinker   Drug use: No   Sexual activity: Not Currently    Birth control/protection: Post-menopausal, Surgical   Tobacco Counseling Counseling given: Not Answered Tobacco comments: quit 19 years ago  SDOH Screenings   Food Insecurity: No Food Insecurity (06/10/2024)  Housing: Low Risk (06/10/2024)  Transportation Needs: No Transportation Needs (06/10/2024)  Utilities: Not At Risk (06/10/2024)  Alcohol Screen: Low Risk (  06/10/2023)  Depression (PHQ2-9): Low Risk (06/10/2024)  Financial Resource Strain: Low Risk (01/16/2023)  Physical Activity: Inactive (06/10/2024)  Social Connections: Moderately Isolated (06/10/2024)  Stress: No Stress Concern Present (06/10/2024)  Tobacco Use: Medium Risk (06/10/2024)  Health Literacy: Adequate Health Literacy (01/16/2023)   See flowsheets for full screening details  Depression Screen PHQ 2 & 9 Depression Scale- Over the past 2 weeks, how often have you been bothered by any of the following problems? Little interest or pleasure in doing things: 0 Feeling down, depressed, or hopeless (PHQ Adolescent also includes...irritable): 0 PHQ-2 Total Score: 0 Trouble falling or staying asleep, or sleeping too much: 3 (always has trouble) Feeling tired or having little energy: 0 Poor appetite or overeating (PHQ Adolescent also includes...weight loss): 0 Feeling bad about yourself - or that you are a failure or have let yourself or your family down: 0 Trouble concentrating on things, such as reading the newspaper or watching television (PHQ Adolescent also includes...like school work): 0 Moving or speaking so slowly that other people could have noticed. Or the opposite - being so fidgety or restless that you have been moving around a lot more than usual: 0 Thoughts that you would be better off dead, or of hurting yourself in some way: 0 PHQ-9 Total Score: 3  Depression Treatment Depression Interventions/Treatment : PHQ2-9  Score <4 Follow-up Not Indicated     Goals Addressed             This Visit's Progress    To restart exercise at the [facility]                     Objective:    Today's Vitals   06/10/24 0900  Weight: 216 lb (98 kg)  Height: 5' 4 (1.626 m)   Body mass index is 37.08 kg/m.  Hearing/Vision screen No results found. Immunizations and Health Maintenance Health Maintenance  Topic Date Due   OPHTHALMOLOGY EXAM  Never done   COVID-19 Vaccine (5 - 2025-26 season) 02/24/2024   Hepatitis B Vaccines 19-59 Average Risk (3 of 3 - 19+ 3-dose series) 06/07/2024   HEMOGLOBIN A1C  09/10/2024   FOOT EXAM  12/05/2024   Diabetic kidney evaluation - Urine ACR  03/13/2025   Diabetic kidney evaluation - eGFR measurement  04/20/2025   Medicare Annual Wellness (AWV)  06/10/2025   Mammogram  08/28/2025   Colonoscopy  07/17/2027   DTaP/Tdap/Td (4 - Td or Tdap) 11/14/2032   Pneumococcal Vaccine: 50+ Years  Completed   Influenza Vaccine  Completed   Bone Density Scan  Completed   Hepatitis C Screening  Completed   HIV Screening  Completed   Zoster Vaccines- Shingrix  Completed   Meningococcal B Vaccine  Aged Out        Assessment/Plan:  This is a routine wellness examination for Lotus.  Patient Care Team: Daryl Setter, NP as PCP - General (Internal Medicine) Okey Vina GAILS, MD as PCP - Cardiology (Cardiology) Timmy Maude SAUNDERS, MD as Consulting Physician (Internal Medicine) Tobie Grange, MD as Consulting Physician (Endocrinology) Timmy Maude SAUNDERS, MD as Consulting Physician (Oncology) Porter Andrez SAUNDERS, PA-C (Inactive) as Physician Assistant (Dermatology)  I have personally reviewed and noted the following in the patients chart:   Medical and social history Use of alcohol, tobacco or illicit drugs  Current medications and supplements including opioid prescriptions. Functional ability and status Nutritional status Physical activity Advanced directives List of other  physicians Hospitalizations, surgeries, and ER visits in previous 12  months Vitals Screenings to include cognitive, depression, and falls Referrals and appointments  Orders Placed This Encounter  Procedures   MM 3D SCREENING MAMMOGRAM BILATERAL BREAST    Standing Status:   Future    Expected Date:   08/31/2024    Expiration Date:   06/10/2025    Reason for Exam (SYMPTOM  OR DIAGNOSIS REQUIRED):   breast cancer screening    Preferred imaging location?:   [facility]   DG Bone Density    Standing Status:   Future    Expected Date:   06/10/2024    Expiration Date:   06/10/2025    Reason for Exam (SYMPTOM  OR DIAGNOSIS REQUIRED):   osteopenia, postmenopausal estrogen deficiency    Preferred imaging location?:   [facility]   In addition, I have reviewed and discussed with patient certain preventive protocols, quality metrics, and best practice recommendations. A written personalized care plan for preventive services as well as general preventive health recommendations were provided to patient.   Lolita Libra, CMA   06/10/2024   Return in 1 year (on 06/10/2025).  After Visit Summary: (MyChart) Due to this being a telephonic visit, the after visit summary with patients personalized plan was offered to patient via MyChart   Nurse Notes: Patient advised to keep follow-up appointment with PCP (07/10/24) Appointment(s) made: (AWV) HM Addressed: Vaccines Due: 3rd Hep B Mammogram ordered DEXA ordered Pt will schedule DM eye exam at earliest convenience.

## 2024-06-26 ENCOUNTER — Other Ambulatory Visit: Payer: Self-pay | Admitting: *Deleted

## 2024-06-26 MED ORDER — PANTOPRAZOLE SODIUM 40 MG PO TBEC: 40.0000 mg | DELAYED_RELEASE_TABLET | Freq: Two times a day (BID) | ORAL | 2 refills | Status: DC

## 2024-06-30 ENCOUNTER — Other Ambulatory Visit: Payer: Self-pay | Admitting: *Deleted

## 2024-06-30 ENCOUNTER — Ambulatory Visit (INDEPENDENT_AMBULATORY_CARE_PROVIDER_SITE_OTHER)

## 2024-06-30 DIAGNOSIS — E538 Deficiency of other specified B group vitamins: Secondary | ICD-10-CM

## 2024-06-30 MED ORDER — CYANOCOBALAMIN 1000 MCG/ML IJ SOLN
1000.0000 ug | Freq: Once | INTRAMUSCULAR | Status: AC
  Administered 2024-06-30: 1000 ug via INTRAMUSCULAR

## 2024-06-30 NOTE — Progress Notes (Signed)
 Patient is here for monthly B12 injection per original order dated: 04/27/2024, per Eleanor Ponto: B12 is low, please start B12 1000 mcg IM weekly x 4 then monthly  Last B12 injection: 05/26/2024  Last B12 level:  04/24/2024 180  B12 given in Left Deltoid IM, and patient tolerated injection well.  Next B12 injection scheduled for: 1 Month on February 6th ,2026 @ 1045am

## 2024-06-30 NOTE — Progress Notes (Unsigned)
 " Cardiology Office Note   Date:  06/30/2024  ID:  PRESCILLA [name], DOB [birth year], MRN [id number] PCP: Daryl Setter, NP  [facility] Providers Cardiologist:  Vina Gull, MD   History of Present Illness [name] is a 64 y.o. female with a past medical history of nonobstructive CAD, longstanding chest pain, esophageal stricture, diabetes mellitus type 2, endometriosis, HTN, inability to use ACE/ARB, HLD, thyroid  disease, fatty liver, IDA, migraine, borderline dilation of aortic root not seen on subsequent study, liver hemangioma, hiatal hernia, sleep apnea by 2024 sleep study, carotid artery disease by CTA 07/2022, epidermodysplasia verruciformis here for follow-up appointment.  Had episodic evaluations (cardiac catheterization/stress/CTA) for chronic chest pain over the years dating back to 2009.  Last cardiac cath 08/2021 showed mild nonobstructive disease of the LAD and circumflex, moderate intermediate branch unchanged from 2019, small caliber PDA too small for PCI, manage medically.  She was started on Lasix  as needed by APP 07/2022 due to mild volume overload in the setting of obesity.  Coronary CTA 08/2022 showed CAC 1504, 99th percentile, nonobstructive CAD also mention aortic root calcifications, moderate mixed plaque in the proximal RCA, mild diffuse mid distal RCA, minimal LAD plaque with short area of mid to distal LAD with myocardial bridging, mild calcification in the proximal to mid LAD also incidentally noted circumferential thickening of the esophagus, underlying mass not excluded similar to prior CT.  Endoscopy 03/2022 for central previous abnormal CT findings had shown 1 cm HH, benign-appearing blebs in the lower esophagus and dilation of esophagus, mucosal changes of the duodenum.  ER visit 07/2023 fleeting sharp sided left chest pain with left arm discomfort with mixed features.  She had negative troponins and reassuring EKG.  CTA of the head/neck 07/2023 for  suspected peripheral dizziness showed atheromatous changes about the carotid siphons with associated moderate to severe stenosis bilaterally, aortic atherosclerosis.  Office visit with Lonell Bring, PA-C reportedly elevated blood pressures at that visit so chlorthalidone  was added.  She cannot take ACE or ARB (cough and angioedema).  Heart rate had been in the 50s precluding addition of beta-blocker.  Echo done 08/2023 for surveillance of aortic root size showing EF 67%, G1 DD, normal aortic size.  Cardiac PET 10/2023 for continued chest pain was low risk without evidence of ischemia, EF 59%.  Overread continues showed thickening in the distal esophagus and hepatic steatosis so she was advised to follow-up with GI.  Was seen by ENT for vertigo and had ear tubes placed without significant change or persistent dizziness.  This persist despite normal VS/stable EKG.  Referred to neurology given previous cerebrovascular disease seen on imaging.  At her last office visit 12/23/2023 she been feeling about the same.  She been playing phone tag with GI unfortunately.  She continued to have episodic nonexertional chest pain in various locations lasting 3 to 20 minutes relieved spontaneously without anything in particular.  There is no particular pattern to provocation.  She has had to take her as needed Lasix  few times over the past week due to DOE.  Was feeling well at the time of her last visit.  Today, she***  ROS: Pertinent ROS in HPI  Studies Reviewed     NM PET/CT coronary perfusion study 11/20/2023  Narrative & Impression      The study is normal. The study is low risk.   LV perfusion is normal. There is no evidence of ischemia. There is no evidence of infarction.   Rest left ventricular  function is normal. Rest EF: 59%. Stress left ventricular function is normal. Stress EF: 68%. End diastolic cavity size is normal. End systolic cavity size is normal.   Myocardial blood flow was computed to be  0.77ml/g/min at rest and 2.15ml/g/min at stress. Global myocardial blood flow reserve was 2.24 and was normal.   Coronary calcium  was present on the attenuation correction CT images. Severe coronary calcifications were present. Coronary calcifications were present in the left anterior descending artery, left circumflex artery and right coronary artery distribution(s). Aortic atherosclerosis noted.  Aortic valve calcification noted.   Electronically Signed  By: Stanly Leavens M.D.   EXAM: OVER-READ INTERPRETATION  PET-CT CHEST   The following report is an over-read performed by radiologist Dr. Camellia Lang [facility] on 11/20/2023. This over-read does not include interpretation of cardiac or coronary anatomy or pathology. The cardiac PET and cardiac CT interpretation by the cardiologist is to be attached.   COMPARISON:  Abdomen pelvis CT 03/02/2022   FINDINGS: No evidence for lymphadenopathy within the visualized mediastinum or hilar regions. Circumferential wall thickening noted distal esophagus, similar to prior study.   The visualized lung parenchyma shows no suspicious pulmonary nodule or mass. No focal airspace consolidation. No effusion.   Diffusely decreased attenuation in the right hepatic lobe with relative sparing of the left lobe suggest fatty deposition.   No worrisome lytic or sclerotic osseous abnormality.   IMPRESSION: 1. Circumferential wall thickening in the distal esophagus, similar to prior study. Esophagitis would be a consideration. 2. Hepatic steatosis.     Electronically Signed   By: Camellia Candle M.D.   On: 11/20/2023 08:20   Risk Assessment/Calculations {Does this patient have ATRIAL FIBRILLATION?:[phone]} No BP recorded.  {Refresh Note OR Click here to enter BP  :1}***       Physical Exam VS:  There were no vitals taken for this visit.       Wt Readings from Last 3 Encounters:  06/10/24 216 lb (98 kg)  04/24/24 218 lb  9.6 oz (99.2 kg)  04/20/24 216 lb (98 kg)    GEN: Well nourished, well developed in no acute distress NECK: No JVD; No carotid bruits CARDIAC: ***RRR, no murmurs, rubs, gallops RESPIRATORY:  Clear to auscultation without rales, wheezing or rhonchi  ABDOMEN: Soft, non-tender, non-distended EXTREMITIES:  No edema; No deformity   ASSESSMENT AND PLAN Precordial pain, CAD HLD Dyspnea on HTN Sleep apnea Carotid artery disease    {Are you ordering a CV Procedure (e.g. stress test, cath, DCCV, TEE, etc)?   Press F2        :[id number]}  Dispo: ***  Signed, Orren LOISE Fabry, PA-C   "

## 2024-07-02 ENCOUNTER — Ambulatory Visit: Admitting: Physician Assistant

## 2024-07-02 DIAGNOSIS — I10 Essential (primary) hypertension: Secondary | ICD-10-CM

## 2024-07-02 DIAGNOSIS — I2510 Atherosclerotic heart disease of native coronary artery without angina pectoris: Secondary | ICD-10-CM

## 2024-07-02 DIAGNOSIS — E785 Hyperlipidemia, unspecified: Secondary | ICD-10-CM

## 2024-07-02 DIAGNOSIS — G4730 Sleep apnea, unspecified: Secondary | ICD-10-CM

## 2024-07-02 DIAGNOSIS — R072 Precordial pain: Secondary | ICD-10-CM

## 2024-07-02 DIAGNOSIS — I6523 Occlusion and stenosis of bilateral carotid arteries: Secondary | ICD-10-CM

## 2024-07-02 DIAGNOSIS — R0602 Shortness of breath: Secondary | ICD-10-CM

## 2024-07-06 NOTE — Progress Notes (Signed)
 "  Office Visit Note  Patient: [name]             Date of Birth: Dec 19, 1960           MRN: [id number]             PCP: Daryl Setter, NP Referring: Daryl Setter, NP Visit Date: 07/17/2024   Subjective:  Joint Pain  Discussed the use of AI scribe software for clinical note transcription with the patient, who gave verbal consent to proceed.  History of Present Illness   [name] is a 64 year old female who presents with joint pain and shoulder discomfort and possible inflammatory arthritis.  She experiences generalized joint pain, described as a 'toothache', with significant severity in her knees and shoulder. The knee pain is persistent, particularly bothersome at night, and disrupts her sleep. She has a history of knee injections, which she has not had recently, and the left knee is currently the most problematic, with pain described as 'aching like toothpaste' at night.  The shoulder pain began about a week ago, with discomfort radiating down her arm. She reports pain and limited ability to raise her arm due to discomfort. She has been using muscle rubs without relief and notes that her muscles do not relax easily.  She has a history of taking muscle relaxants, specifically methocarbamol , but has run out of her prescription. She is currently taking Cymbalta  daily, which provides moderate relief. She has previously taken Flexeril  but reports gastrointestinal bleeding as a side effect.      Previous HPI 10/04/2023 [name] is a 64 year old female with osteoarthritis and possible inflammatory arthritis.   She has been experiencing persistent aches and pains for several years, with stiffness and pain in multiple areas, including her back. Duloxetine  (Cymbalta ) was previously prescribed but did not significantly improve her symptoms, and she experienced no notable side effects. Physical therapy for her back over the past two months has not been  beneficial. Previous injections in her back are currently avoided due to potential interactions with her diabetes medication.   She experiences swelling and soreness in her knees, particularly the right knee, which often wakes her at night. Despite attending physical therapy, she feels it has not been beneficial. A history of elevated inflammatory markers was noted in blood tests a few months ago, but no diagnosis of lupus or other specific autoimmune conditions has been made. She continues to experience pain, particularly on the right side of her body. No recent skin changes, rashes, or upper respiratory infections.   She experiences tingling in her fingers and dizziness, which she attributes to ear issues. She is scheduled to see a surgeon on May 1st for fluid buildup in her ear and has been prescribed medication for dizziness.   Additionally, she reports cramps that extend from her head to her arm intermittently.    04/26/23 [name] is a 64 y.o. female here for follow up with continued joint pain in multiple areas.  Lab testing at initial visit showed positive ANA at 1: 80 titer decreased from 1: 320 previously.  Sedimentation rate was still just slightly above normal.  Other antibodies all negative.  Worst area is in her back.  Gets radiation into the legs with prolonged standing this is worse on the right side.  Walking is better and sitting is better compared to standing still.  She has had similar symptoms in the past says this was associated with lumbar  disc disease.  Does not see any spine specialist for years.  She has previously gotten good relief with her chiropractor but because pain has been more manageable was not following up with them regularly.   03/22/23 [name] is a 64 y.o. female here for evaluation of joint pain with positive ANA and ESR.  Medical history is also significant for thyrotoxicosis on treatment with methimazole , esophageal dysphagia and stricture,  gastroparesis, and type 2 diabetes.  Some degree of chronic joint pain most prominent in her low back and hips that is not new but symptoms increased in the past year.  She develops area of numbness on the lateral portion of the thigh that occurs after prolonged standing.  Only occasional pain down the back of the leg but she does state very sore in the low back and this gets worse after prolonged walking or standing.  She has morning stiffness lasting about an hour.  Not associated with any significant new illness or injury.  She does have a previous history of left knee injury that was repaired arthroscopically many years ago and gets some pain with increased use in that area.  Does not take any daily medications for joint pain.  She previously saw orthopedic specialist with back steroid injections that helped for a short duration.  She previously saw physical therapy years ago for back pain but did not feel like the treatment helped. Besides joint problems she does describe chronic eye dryness.  Was not found to have any Graves ophthalmopathy with previous evaluations.  Denies oral or nasal ulcers, Raynaud's syndrome, cervical or axillary lymphadenopathy, or abnormal bruising or bleeding.  She has patchy hyperpigmented spots on the distal arms and legs both sides she described as her birthmark is not new or any recent change. Chronic dysphagia previously seeing Dr. Leigh imaging and esophageal manometry consistent with hypercontractile esophagus. She is currently off the methimazole  due to running out of medication just recently.  From chart review looks like she was recommended to have repeat thyroid  parameters checked in July but these labs were never collected sounds like she was not aware or does not member at this time, has been taking 5 mg once daily refill was requested 9/16.   Labs reviewed ANA 1:80 speckled RF neg ESR 58     Review of Systems  Constitutional:  Negative for fatigue.   HENT:  Positive for mouth dryness. Negative for mouth sores.   Eyes:  Negative for dryness.  Respiratory:  Positive for shortness of breath.   Cardiovascular:  Positive for chest pain. Negative for palpitations.  Gastrointestinal:  Positive for constipation. Negative for blood in stool and diarrhea.  Endocrine: Negative for increased urination.  Genitourinary:  Negative for involuntary urination.  Musculoskeletal:  Positive for joint pain, joint pain, myalgias, muscle weakness, morning stiffness, muscle tenderness and myalgias. Negative for gait problem and joint swelling.  Skin:  Negative for color change, rash, hair loss and sensitivity to sunlight.  Allergic/Immunologic: Positive for susceptible to infections.  Neurological:  Negative for dizziness and headaches.  Hematological:  Negative for swollen glands.  Psychiatric/Behavioral:  Negative for depressed mood and sleep disturbance. The patient is not nervous/anxious.     PMFS History:  Patient Active Problem List   Diagnosis Date Noted   Impingement syndrome of right shoulder 07/17/2024   Otitis media 07/15/2024   Musculoskeletal chest pain 07/15/2024   B12 deficiency 04/27/2024   Bradycardia 04/24/2024   OSA (obstructive sleep apnea) 03/13/2024  Unilateral primary osteoarthritis, left knee 10/04/2023   Type 2 diabetes mellitus with hyperglycemia, without long-term current use of insulin  (HCC) 08/16/2023   Dysfunction of both eustachian tubes 08/16/2023   Positive ANA (antinuclear antibody) 03/22/2023   Sedimentation rate elevation 03/22/2023   Bulging of lumbar intervertebral disc 09/24/2022   Arthritis 05/11/2022   Controlled type 2 diabetes mellitus without complication, without long-term current use of insulin  (HCC) 12/01/2021   Coronary artery disease of native heart with stable angina pectoris 06/30/2021   Hypercontractile esophagus    Aortic atherosclerosis 07/17/2017   Insomnia 01/12/2017   Hyperthyroidism  02/29/2012   Esophageal dysphagia 04/18/2011   Ulnar neuropathy 03/30/2011   Fatigue 01/25/2011   Cervical radiculopathy 10/18/2010   Fibromyalgia 07/20/2009   Hyperlipidemia 05/26/2009   Vitamin D  deficiency 05/23/2009   Polyarthralgia 04/12/2009   Iron  deficiency anemia 10/25/2008   GERD 10/25/2008   LIVER HEMANGIOMA 01/01/2008   ESOPHAGEAL STRICTURE 01/01/2008   Gastroparesis 01/01/2008   Anemia 12/08/2007   Allergic rhinitis 12/08/2007   Migraine headache 08/11/2007   HIATAL HERNIA 08/11/2007   OVARIAN CYST 08/11/2007   Hypertension associated with diabetes (HCC) 05/06/2007    Past Medical History:  Diagnosis Date   Allergy    allergic rhinitis   Arthritis    Atypical chest pain    Constipation    Cyst, ovarian    Diabetes (HCC)    Diabetes mellitus, type II (HCC)    Elevated alkaline phosphatase level    Endometriosis    Esophageal stricture    Fatty liver    Gastroparesis    GERD (gastroesophageal reflux disease)    Heart murmur    Hepatic hemangioma    Hyperlipidemia    Hypertension    Hypertrophic condition of skin    acrokeratoelastoidosis- s/p 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 1/08- benign   Iron  deficiency anemia    Migraine headache    Non-compliance    Peptic ulcer disease    Sleep apnea    Thyroid  disease     Family History  Problem Relation Age of Onset   Hypertension Mother    Rheum arthritis Mother    Hypertension Father    Diabetes Father    Prostate cancer Father    Kidney disease Father    Diabetes Sister    Fibromyalgia Sister    Diabetes Maternal Grandmother    Lung cancer Maternal Grandfather    Arthritis Brother    Migraines Brother    CAD Brother    Fibromyalgia Sister    Migraines Sister    Colon cancer Neg Hx    Thyroid  disease Neg Hx    Colon polyps Neg Hx    Past Surgical History:  Procedure Laterality Date   ABDOMINAL EXPLORATION SURGERY     w/bso    BREAST BIOPSY     CARDIAC CATHETERIZATION  2009   mild non obstructive CAD    CHOLECYSTECTOMY     COLONOSCOPY     ESOPHAGEAL MANOMETRY N/A 08/01/2020   Procedure: ESOPHAGEAL MANOMETRY (EM);  Surgeon: Leigh Elspeth SQUIBB, MD;  Location: [facility];  Service: Gastroenterology;  Laterality: N/A;   KNEE SURGERY  2005    left knee   LEFT HEART CATH AND CORONARY ANGIOGRAPHY N/A 07/17/2017   Procedure: LEFT HEART CATH AND CORONARY ANGIOGRAPHY;  Surgeon: Jordan, Peter M, MD;  Location: [facility] INVASIVE CV LAB;  Service: Cardiovascular;  Laterality: N/A;   LEFT HEART CATH AND CORONARY ANGIOGRAPHY N/A 09/01/2021   Procedure: LEFT HEART CATH AND CORONARY ANGIOGRAPHY;  Surgeon: Verlin Lonni BIRCH, MD;  Location: [facility] INVASIVE CV LAB;  Service: Cardiovascular;  Laterality: N/A;   POLYPECTOMY     TOTAL ABDOMINAL HYSTERECTOMY     UPPER GASTROINTESTINAL ENDOSCOPY     Social History   Social History Narrative   Widowed   Has 2 grown children.  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    Former Smoker Quit tobacco in 1996.  She was a pack a day smoker for approximately 10 years.    Alcohol use-yes: Social    Daily Caffeine Use:6 pack of pepsi daily     Illicit Drug Use - no    Patient does not get regular exercise.       Smoking Status:  quit      Right handed   Living alone, 5 steps to up entrance of the house   Retired    Immunization History  Administered Date(s) Administered   Fluad Quad(high Dose 65+) 03/31/2019   Hep A / Hep B 12/07/2023, 01/27/2024   Influenza Inj Mdck Quad Pf 02/24/2024   Influenza Split 04/30/2011   Influenza Whole 04/19/2006, 04/17/2007, 03/08/2009, 03/01/2010   Influenza, Quadrivalent, Recombinant, Inj, Pf 03/31/2019   Influenza, Seasonal, Injecte, Preservative Fre 05/30/2012   Influenza,inj,Quad PF,6+ Mos 02/15/2014, 03/08/2015, 04/30/2016, 04/24/2017, 04/04/2018, 06/10/2020   Influenza-Unspecified 02/23/2021, 03/10/2023   PFIZER(Purple Top)SARS-COV-2 Vaccination 09/07/2019, 09/29/2019, 05/20/2020    PNEUMOCOCCAL CONJUGATE-20 03/19/2023   Pfizer Covid-19 Vaccine Bivalent Booster 29yrs & up 02/23/2021   Pfizer(Comirnaty)Fall Seasonal Vaccine 12 years and older 12/07/2023   Pneumococcal Conjugate Pcv21, Polysaccharide Crm197 Conjugaf 12/07/2023   Pneumococcal Conjugate-13 04/04/2018   Pneumococcal Polysaccharide-23 03/08/2009, 09/23/2020   Td 05/17/1999   Tdap 04/30/2011, 11/15/2022   Zoster Recombinant(Shingrix) 03/31/2019, 08/02/2019     Objective: Vital Signs: BP 127/69   Pulse 68   Temp (!) 97.3 F (36.3 C)   Resp 16   Ht 5' 4 (1.626 m)   Wt 226 lb (102.5 kg)   BMI 38.79 kg/m    Physical Exam Eyes:     Conjunctiva/sclera: Conjunctivae normal.  Cardiovascular:     Rate and Rhythm: Normal rate and regular rhythm.  Pulmonary:     Effort: Pulmonary effort is normal.     Breath sounds: Normal breath sounds.  Lymphadenopathy:     Cervical: No cervical adenopathy.  Skin:    General: Skin is warm and dry.  Neurological:     Mental Status: She is alert.  Psychiatric:        Mood and Affect: Mood normal.      Musculoskeletal Exam:  Shoulders full ROM, Right shoulder tenderness anterior side, Pain radiaing partway to elbow provoked with internal rotation while elevated and with full abduction Elbows full ROM no tenderness or swelling Wrists full ROM no tenderness or swelling Fingers full ROM no tenderness or swelling Low back midline and paraspinal tenderness much more right than left Mild right lateral hip tenderness to pressure Left knee anterior and medial joint line tenderness to pressure, no palpable effusion, crepitus   Investigation: No additional findings.  Imaging: CT ABDOMEN PELVIS W CONTRAST Result Date: 07/18/2024 CLINICAL DATA:  Abdominal pain and bloating. Nausea and vomiting. Gastroparesis. EXAM: CT ABDOMEN AND PELVIS WITH CONTRAST TECHNIQUE: Multidetector CT imaging of the abdomen and pelvis was performed using the standard protocol following bolus  administration of intravenous contrast. RADIATION DOSE REDUCTION: This exam was performed according to the departmental dose-optimization program which includes automated exposure control, adjustment of  the mA and/or kV according to patient size and/or use of iterative reconstruction technique. CONTRAST:  OMNIPAQUE  IOHEXOL  300 MG/ML  SOLN COMPARISON:  03/02/2022 FINDINGS: Lower Chest: No acute findings. Patulous thoracic esophagus is again seen, which may be due to achalasia or distal esophageal stricture. Hepatobiliary: Mild diffuse hepatic steatosis again seen. 3 cm heterogeneous low-attenuation lesion in the posterior right hepatic lobe remains stable, consistent with benign etiology. This was previously characterized as a benign hemangioma by MRI. No new or enlarging liver lesions identified. Prior cholecystectomy. No evidence of biliary obstruction. Pancreas:  No mass or inflammatory changes. Spleen: Within normal limits in size and appearance. Adrenals/Urinary Tract: No suspicious masses identified. No evidence of ureteral calculi or hydronephrosis. Unremarkable unopacified urinary bladder. Stomach/Bowel: No evidence of obstruction, inflammatory process or abnormal fluid collections. Normal appendix visualized. Vascular/Lymphatic: No pathologically enlarged lymph nodes. No acute vascular findings. Reproductive: Prior hysterectomy noted. Adnexal regions are unremarkable in appearance. Other:  None. Musculoskeletal:  No suspicious bone lesions identified. IMPRESSION: No acute findings within the abdomen or pelvis. Patulous thoracic esophagus, which may be due to achalasia or distal esophageal stricture. Stable hepatic steatosis and benign hemangioma. Electronically Signed   By: Norleen DELENA Kil M.D.   On: 07/18/2024 18:27    Recent Labs: Lab Results  Component Value Date   WBC 7.6 07/10/2024   HGB 11.8 (L) 07/10/2024   PLT 232.0 07/10/2024   NA 140 07/10/2024   K 3.5 07/10/2024   CL 102 07/10/2024    CO2 28 07/10/2024   GLUCOSE 163 (H) 07/10/2024   BUN 12 07/10/2024   CREATININE 0.82 07/10/2024   BILITOT 0.3 07/15/2024   ALKPHOS 107 07/15/2024   AST 16 07/15/2024   ALT 18 07/15/2024   PROT 7.2 07/15/2024   ALBUMIN 4.2 07/15/2024   CALCIUM  9.4 07/10/2024   GFRAA >60 04/06/2019    Speciality Comments: No specialty comments available.  Procedures:  Large Joint Inj: L knee on 07/17/2024 11:30 AM Indications: pain Details: 27 G 1.5 in needle, anteromedial approach Medications: 2 mL lidocaine  1 %; 40 mg triamcinolone  acetonide 40 MG/ML Outcome: tolerated well, no immediate complications Procedure, treatment alternatives, risks and benefits explained, specific risks discussed. Consent was given by the patient. Immediately prior to procedure a time out was called to verify the correct patient, procedure, equipment, support staff and site/side marked as required. Patient was prepped and draped in the usual sterile fashion.     Allergies: Celebrex [celecoxib], Diovan  [valsartan ], Metformin , and Ace inhibitors   Assessment / Plan:     Visit Diagnoses: Fibromyalgia - Prescribed methocarbamol  500 mg QHS for nighttime pain and sleep.  - Continue duloxetine  60 mg daily  Unilateral primary osteoarthritis, left knee - Plan: Large Joint Inj: L knee Chronic osteoarthritis with nocturnal pain disrupting sleep. No significant fluid accumulation. - Administered left knee injection for pain relief.  Impingement syndrome of right shoulder Shoulder impingement syndrome Acute shoulder impingement likely due to tendinitis or rotator cuff issues. No significant joint damage. - Prescribed methocarbamol  for nighttime muscle relaxation. - Provided range of motion exercises. - Advised to monitor symptoms and report if no improvement.   Orders: Orders Placed This Encounter  Procedures   Large Joint Inj: L knee   Meds ordered this encounter  Medications   DULoxetine  (CYMBALTA ) 60 MG capsule     Sig: Take 1 capsule (60 mg total) by mouth daily.    Dispense:  90 capsule    Refill:  1   methocarbamol  (ROBAXIN )  500 MG tablet    Sig: Take 1 tablet (500 mg total) by mouth at bedtime as needed for muscle spasms.    Dispense:  90 tablet    Refill:  1     Follow-Up Instructions: Return in about 3 months (around 10/15/2024), or if symptoms worsen or fail to improve, for OA/Shoulder/FMS on CYM/inj f/u 3-60mos PRN.   Lonni LELON Ester, MD  Note - This record has been created using [employer].  Chart creation errors have been sought, but may not always  have been located. Such creation errors do not reflect on  the standard of medical care. "

## 2024-07-09 NOTE — Progress Notes (Signed)
 "    07/09/2024 [name] [id number] 09-May-1961  Primary Gastroenterologist: Dr. Leigh   Chief Complaint: Nausea, vomiting   History of Present Illness: [name] is a 64 year old female with a past medical history of arthritis, hypertension, hyperlipidemia, nonobstructive coronary artery disease, hypothyroidism, migraine headaches, iron  deficiency anemia, iron  B12 deficiency, DM type II, GERD, benign esophageal stenosis, 2 cm hiatal hernia, gastroparesis, hepatic hemangioma and hepatic steatosis.  Past cholecystectomy.    Discussed the use of AI scribe software for clinical note transcription with the patient, who gave verbal consent to proceed.  History of Present Illness [name] is a 64 year old female with gastroparesis, hiatal hernia, and prior cholecystectomy who presents with persistent nausea, vomiting, early satiety, and refractory heartburn.  She was last seen in office by Dr. Leigh 01/20/2024 at that time she she endorsed having recurrent dysphagia and an EGD was performed 02/28/2024 which identified a normal esophagus which was empirically dilated to 17 mm, a 2 cm hiatal hernia with benign-appearing blebs found in the esophagus unchanged when compared to prior exam and the stomach was normal.  Duodenal biopsies were negative for celiac disease.  Her anemia was stable and labs were consistent with anemia of chronic disease and a colonoscopy was not thought to be warranted at that time.  Since around Thanksgiving 2025, she has experienced daily nausea and vomiting, with emesis occurring once or twice per day after meals and consisting of recently ingested food. She denies hematemesis or coffee ground emesis. No current use of antiemetic medications and no prior use of ondansetron .  Early satiety has been present for approximately one year, with fullness after only two or three bites of food. Significant bloating is reported, sometimes described as  feeling six months pregnant. Cottage cheese and yogurt with fruit are the only foods that provide relief, and she avoids eating out due to symptoms.  Daily heartburn and acid reflux persist despite pantoprazole  twice daily, which she finds ineffective. Alka-Seltzer is used for breakthrough symptoms. She previously tolerated esomeprazole . Aspirin  and meloxicam  are taken daily, and Aleve  is used about three times per month for arthritis pain, sometimes without food.  She reports a history of hiatal hernia and states she underwent an upper endoscopy in September 2025 as noted above.  She reports being told she has gastroparesis and follows dietary recommendations for small, snack-sized meals but continues to experience early satiety. History of ulcer and prior cholecystectomy.  She denies abdominal pain, fever, or bloody/black stools. Bowel movements are soft and occur about three times per day, which is more frequent than her baseline and an improvement from prior constipation. No diarrhea.  She has frequent abdominal bloat and intermittent left-sided abdominal pain.  No acute weight loss; weight has increased from 216 lbs in October 2025 to 224 lbs currently. She does not consume significant caffeine and has not had any recent acute illness or new medications around symptom onset. A brain MRI for dizziness was normal.     Latest Ref Rng & Units 04/20/2024    9:02 PM 02/08/2024   12:48 AM 12/06/2023   12:05 PM  CBC  WBC 4.0 - 10.5 K/uL 10.3  8.8  8.6   Hemoglobin 12.0 - 15.0 g/dL 88.0  87.7  87.8   Hematocrit 36.0 - 46.0 % 39.2  41.1  40.4   Platelets 150 - 400 K/uL 223  255  252        Latest Ref Rng & Units  04/20/2024    9:02 PM 03/13/2024    9:58 AM 02/08/2024   12:48 AM  CMP  Glucose 70 - 99 mg/dL 880  859  875   BUN 8 - 23 mg/dL 26  17  12    Creatinine 0.44 - 1.00 mg/dL 9.11  9.21  9.14   Sodium 135 - 145 mmol/L 140  138  139   Potassium 3.5 - 5.1 mmol/L 3.7  3.8  3.9   Chloride 98 -  111 mmol/L 104  103  104   CO2 22 - 32 mmol/L 26  26  23    Calcium  8.9 - 10.3 mg/dL 9.2  9.4  9.3   Total Protein 6.5 - 8.1 g/dL   7.7   Total Bilirubin 0.0 - 1.2 mg/dL   0.3   Alkaline Phos 38 - 126 U/L   106   AST 15 - 41 U/L   21   ALT 0 - 44 U/L   27     Labs 03/13/2024: Iron  67.  Saturation ratios 24.5.  Ferritin 853.5. Labs 04/24/2024: Vitamin B12 level 180. Started on vitamin B 12 1000 mcg IM weekly x 4 then monthly per PCP.  GI PROCEDURES:  EGD 02/2024: - Esophagogastric landmarks identified.  - 2 cm hiatal hernia. - Benign appearing blebs found in the esophagus - similar in appearance to the last exam.  - Normal esophagus otherwise - empiric dilation performed to 17mm with dilation effect noted just inferior to the UES.  - Normal stomach.  - Duodenal mucosa changes as outlined. Biopsied.  - Normal duodenum otherwise.  1. Surgical [P], small bowel :       - SMALL BOWEL MUCOSA WITH FOCALLY PROMINENT BRUNNER GLANDS, OTHERWISE NO       SPECIFIC PATHOLOGIC CHANGE.       - NEGATIVE FOR INTRAEPITHELIAL LYMPHOCYTOSIS OR VILLOUS BLUNTING.   EGD 03/2022: - Esophagogastric landmarks identified.  - 1 cm hiatal hernia. - Benign appearing blebs found in the lower esophagus.  - Dilation of the esophagus performed to 17mm with Savary, mucosal wrent appreciated just inferior to the UES  - Biopsies of the esophagus obtained  - Normal stomach.  - Mucosal changes in the duodenum as outlined. Biopsied.   1. Surgical [P], 2nd portion of duodenum REACTIVE DUODENAL MUCOSA WITH PROMINENT BRUNNER'S GLANDS NEGATIVE FOR INTRAEPITHELIAL LYMPHOCYTOSIS 2. Surgical [P], mid esophagus, distal esophagus and random sites REACTIVE SQUAMOUS MUCOSA NEGATIVE FOR GLANDULAR EPITHELIUM, EOSINOPHILS, DYSPLASIA AND CARCINOMA   Has history of jackhammer esophagus as seen on esophageal manometry in February 2022.  Colonoscopy 07/16/2017:  - The examined portion of the ileum was normal.  - Tortuous colon.  -  Diverticulosis in the sigmoid colon.  - Internal hemorrhoids.  - The examination was otherwise normal.  - No specimens collected. Overall, suspect internal hemorrhoids is the cause for bleeding symptoms in the setting of constipation.  IMAGE STUDIES:   Cardiac PET 11/20/23:   The study is normal. The study is low risk.   LV perfusion is normal. There is no evidence of ischemia. There is no evidence of infarction.   Rest left ventricular function is normal. Rest EF: 59%. Stress left ventricular function is normal. Stress EF: 68%. End diastolic cavity size is normal. End systolic cavity size is normal.   Myocardial blood flow was computed to be 0.5ml/g/min at rest and 2.15ml/g/min at stress. Global myocardial blood flow reserve was 2.24 and was normal.   Coronary calcium  was present on the attenuation correction  CT images. Severe coronary calcifications were present. Coronary calcifications were present in the left anterior descending artery, left circumflex artery and right coronary artery distribution(s). Aortic atherosclerosis noted.  Aortic valve calcification noted.   Electronically Signed  By: Stanly Leavens M.D. 11/20/23:   FINDINGS: No evidence for lymphadenopathy within the visualized mediastinum or hilar regions. Circumferential wall thickening noted distal esophagus, similar to prior study.   The visualized lung parenchyma shows no suspicious pulmonary nodule or mass. No focal airspace consolidation. No effusion.   Diffusely decreased attenuation in the right hepatic lobe with relative sparing of the left lobe suggest fatty deposition.   No worrisome lytic or sclerotic osseous abnormality.   IMPRESSION: 1. Circumferential wall thickening in the distal esophagus, similar to prior study. Esophagitis would be a consideration. 2. Hepatic steatosis.     Cardiac CT 08/2022: IMPRESSION: No acute abnormality. Circumferential thickening of the esophagus may be to  esophagitis however underlying mass is not excluded. This is similar to chest CT 04/21/2019. Correlation with endoscopy is recommended if not previously. Hepatic steatosis.   IMPRESSION: 1. Coronary calcium  score of 1504. This was 25 percentile for age and sex matched control.   2. Normal coronary origin with right dominance.   3. CAD-RADS 3. Moderate stenosis. Consider symptom-guided anti-ischemic pharmacotherapy as well as risk factor modification per guideline directed care. CT FFR could not be performed due to significant motion artifact. Therefore if symptoms persist with medical therapy consider other forms of diagnostic testing.     Cardiac Cath 09/01/2021: The LAD has mild non-obstructive disease The Circumflex has mild non-obstructive disease The intermediate branch is a moderate caliber vessel with moderate stenosis, unchanged from last cath in 2019 The RCA is a large dominant vessel with mild diffuse calcific disease in the proximal, mid and distal vessel. The small caliber PDA has severe stenosis, overall unchanged from cath in 2019. The PDA is too small for PCI.  Normal LV systolic function. Normal LVEDP.    Chest CTA 04/20/2024:  1. No evidence of pulmonary embolism. 2. Distal esophageal wall thickening; correlate clinically for esophagitis or other esophageal pathology as indicated.    Past Medical History:  Diagnosis Date   Allergy    allergic rhinitis   Arthritis    Atypical chest pain    Constipation    Cyst, ovarian    Diabetes (HCC)    Diabetes mellitus, type II (HCC)    Elevated alkaline phosphatase level    Endometriosis    Esophageal stricture    Fatty liver    Gastroparesis    GERD (gastroesophageal reflux disease)    Heart murmur    Hepatic hemangioma    Hyperlipidemia    Hypertension    Hypertrophic condition of skin    acrokeratoelastoidosis- s/p 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 1/08- benign   Iron  deficiency anemia    Migraine headache     Non-compliance    Peptic ulcer disease    Sleep apnea    Thyroid  disease    Past Surgical History:  Procedure Laterality Date   ABDOMINAL EXPLORATION SURGERY     w/bso    BREAST BIOPSY     CARDIAC CATHETERIZATION  2009   mild non obstructive CAD   CHOLECYSTECTOMY     COLONOSCOPY     ESOPHAGEAL MANOMETRY N/A 08/01/2020   Procedure: ESOPHAGEAL MANOMETRY (EM);  Surgeon: Leigh Elspeth SQUIBB, MD;  Location: [facility];  Service: Gastroenterology;  Laterality: N/A;   KNEE SURGERY  2005    left knee  LEFT HEART CATH AND CORONARY ANGIOGRAPHY N/A 07/17/2017   Procedure: LEFT HEART CATH AND CORONARY ANGIOGRAPHY;  Surgeon: Jordan, Peter M, MD;  Location: [facility] INVASIVE CV LAB;  Service: Cardiovascular;  Laterality: N/A;   LEFT HEART CATH AND CORONARY ANGIOGRAPHY N/A 09/01/2021   Procedure: LEFT HEART CATH AND CORONARY ANGIOGRAPHY;  Surgeon: Verlin Lonni BIRCH, MD;  Location: [facility] INVASIVE CV LAB;  Service: Cardiovascular;  Laterality: N/A;   POLYPECTOMY     TOTAL ABDOMINAL HYSTERECTOMY     UPPER GASTROINTESTINAL ENDOSCOPY     Current Medications, Allergies, Past Medical History, Past Surgical History, Family History and Social History were reviewed in [employer] record.  Review of Systems:   Constitutional: Negative for fever, sweats, chills or weight loss.  Respiratory: Negative for shortness of breath.   Cardiovascular: Negative for chest pain, palpitations and leg swelling.  Gastrointestinal: See HPI.  Musculoskeletal: Negative for back pain or muscle aches.  Neurological: Negative for dizziness, headaches or paresthesias.   Physical Exam: There were no vitals taken for this visit.BP 130/70   Pulse 78   Ht 5' 4 (1.626 m)   Wt 224 lb 6 oz (101.8 kg)   BMI 38.51 kg/m   Wt Readings from Last 3 Encounters:  07/10/24 224 lb 6 oz (101.8 kg)  06/10/24 216 lb (98 kg)  04/24/24 218 lb 9.6 oz (99.2 kg)    General: 64 year old female in no acute distress. Head:  Normocephalic and atraumatic. Eyes: No scleral icterus. Conjunctiva pink . Ears: Normal auditory acuity. Mouth: Dentition intact. No ulcers or lesions.  Lungs: Clear throughout to auscultation. Heart: Regular rate and rhythm, no murmur. Abdomen: Soft, nontender and nondistended. No masses or hepatomegaly. Normal bowel sounds x 4 quadrants.  Rectal: Deferred. Musculoskeletal: Symmetrical with no gross deformities. Extremities: No edema. Neurological: Alert oriented x 4. No focal deficits.  Psychological: Alert and cooperative. Normal mood and affect  Assessment and Recommendations:  64 year old female history of gastroparesis presents with daily nausea and vomiting which occurs shortly after eating since Thanksgiving 2025.  - Ondansetron  4 mg ODT 1 tab dissolve on tongue every 8 hours as needed - Patient instructed to eat 3-4 small snack sized meals daily - CBC, CMP - CTAP with oral and IV contrast to rule out intra-abdominal/pelvic pathology to explain new onset nausea/vomiting and to assess the pancreas  GERD, jackhammer esophagus. GERD symptoms not well controlled on Pantoprazole  40 mg twice daily. Her most recent EGD 02/2024 showed a normal esophagus which was dilated and a 2cm hiatal hernia.  - Stop Pantoprazole  - Start as Esomeprazole  40 mg twice daily to be taken 30 minutes before breakfast and dinner  Dysphagia, chronic and infrequent - Patient to monitor dysphagia for now, if episodes increase will schedule for repeat EGD with esophageal dilatation  Anemia chronic. B12 deficient, on B12 injections once monthly per PCP.  Colon cancer screening. Colonoscopy 06/2017 did not identify any colon polyps.  - Next screening colonoscopy due 06/2027   MASLD - To address at time of next follow-up visit  "

## 2024-07-10 ENCOUNTER — Other Ambulatory Visit

## 2024-07-10 ENCOUNTER — Encounter: Payer: Self-pay | Admitting: Nurse Practitioner

## 2024-07-10 ENCOUNTER — Ambulatory Visit: Admitting: Family

## 2024-07-10 ENCOUNTER — Ambulatory Visit: Admitting: Nurse Practitioner

## 2024-07-10 ENCOUNTER — Ambulatory Visit: Payer: Self-pay | Admitting: Nurse Practitioner

## 2024-07-10 DIAGNOSIS — R112 Nausea with vomiting, unspecified: Secondary | ICD-10-CM

## 2024-07-10 DIAGNOSIS — K224 Dyskinesia of esophagus: Secondary | ICD-10-CM | POA: Diagnosis not present

## 2024-07-10 DIAGNOSIS — K3184 Gastroparesis: Secondary | ICD-10-CM

## 2024-07-10 DIAGNOSIS — R1310 Dysphagia, unspecified: Secondary | ICD-10-CM | POA: Diagnosis not present

## 2024-07-10 DIAGNOSIS — K219 Gastro-esophageal reflux disease without esophagitis: Secondary | ICD-10-CM

## 2024-07-10 DIAGNOSIS — E538 Deficiency of other specified B group vitamins: Secondary | ICD-10-CM

## 2024-07-10 DIAGNOSIS — D649 Anemia, unspecified: Secondary | ICD-10-CM | POA: Diagnosis not present

## 2024-07-10 DIAGNOSIS — R140 Abdominal distension (gaseous): Secondary | ICD-10-CM

## 2024-07-10 LAB — COMPREHENSIVE METABOLIC PANEL WITH GFR
ALT: 34 U/L (ref 3–35)
AST: 18 U/L (ref 5–37)
Albumin: 4.3 g/dL (ref 3.5–5.2)
Alkaline Phosphatase: 120 U/L — ABNORMAL HIGH (ref 39–117)
BUN: 12 mg/dL (ref 6–23)
CO2: 28 meq/L (ref 19–32)
Calcium: 9.4 mg/dL (ref 8.4–10.5)
Chloride: 102 meq/L (ref 96–112)
Creatinine, Ser: 0.82 mg/dL (ref 0.40–1.20)
GFR: 74.85 mL/min
Glucose, Bld: 163 mg/dL — ABNORMAL HIGH (ref 70–99)
Potassium: 3.5 meq/L (ref 3.5–5.1)
Sodium: 140 meq/L (ref 135–145)
Total Bilirubin: 0.3 mg/dL (ref 0.2–1.2)
Total Protein: 7.3 g/dL (ref 6.0–8.3)

## 2024-07-10 LAB — CBC WITH DIFFERENTIAL/PLATELET
Basophils Absolute: 0.1 K/uL (ref 0.0–0.1)
Basophils Relative: 0.7 % (ref 0.0–3.0)
Eosinophils Absolute: 0.1 K/uL (ref 0.0–0.7)
Eosinophils Relative: 0.8 % (ref 0.0–5.0)
HCT: 37.4 % (ref 36.0–46.0)
Hemoglobin: 11.8 g/dL — ABNORMAL LOW (ref 12.0–15.0)
Lymphocytes Relative: 32.9 % (ref 12.0–46.0)
Lymphs Abs: 2.5 K/uL (ref 0.7–4.0)
MCHC: 31.4 g/dL (ref 30.0–36.0)
MCV: 71.4 fl — ABNORMAL LOW (ref 78.0–100.0)
Monocytes Absolute: 0.5 K/uL (ref 0.1–1.0)
Monocytes Relative: 6.3 % (ref 3.0–12.0)
Neutro Abs: 4.5 K/uL (ref 1.4–7.7)
Neutrophils Relative %: 59.3 % (ref 43.0–77.0)
Platelets: 232 K/uL (ref 150.0–400.0)
RBC: 5.24 Mil/uL — ABNORMAL HIGH (ref 3.87–5.11)
RDW: 14.1 % (ref 11.5–15.5)
WBC: 7.6 K/uL (ref 4.0–10.5)

## 2024-07-10 MED ORDER — ESOMEPRAZOLE MAGNESIUM 40 MG PO CPDR: 40.0000 mg | DELAYED_RELEASE_CAPSULE | Freq: Two times a day (BID) | ORAL | 2 refills | Status: AC

## 2024-07-10 MED ORDER — ONDANSETRON 4 MG PO TBDP: 4.0000 mg | ORAL_TABLET | Freq: Three times a day (TID) | ORAL | 1 refills | Status: AC | PRN

## 2024-07-10 NOTE — Progress Notes (Signed)
 Agree with assessment and plan as outlined.

## 2024-07-10 NOTE — Patient Instructions (Signed)
 _______________________________________________________  If your blood pressure at your visit was 140/90 or greater, please contact your primary care physician to follow up on this.  _______________________________________________________  If you are age 66 or older, your body mass index should be between 23-30. Your Body mass index is 38.51 kg/m. If this is out of the aforementioned range listed, please consider follow up with your Primary Care Provider.  If you are age 57 or younger, your body mass index should be between 19-25. Your Body mass index is 38.51 kg/m. If this is out of the aformentioned range listed, please consider follow up with your Primary Care Provider.   ________________________________________________________  The [facility] providers would like to encourage you to use MYCHART to communicate with providers for non-urgent requests or questions.  Due to long hold times on the telephone, sending your provider a message by [facility] may be a faster and more efficient way to get a response.  Please allow 48 business hours for a response.  Please remember that this is for non-urgent requests.  _______________________________________________________  [facility] is using a team-based approach to care.  Your team is made up of your doctor and two to three APPS. Our APPS (Nurse Practitioners and Physician Assistants) work with your physician to ensure care continuity for you. They are fully qualified to address your health concerns and develop a treatment plan. They communicate directly with your gastroenterologist to care for you. Seeing the Advanced Practice Practitioners on your physician's team can help you by facilitating care more promptly, often allowing for earlier appointments, access to diagnostic testing, procedures, and other specialty referrals.   Your provider has requested that you go to the basement level for lab work before leaving today. Press B on the  elevator. The lab is located at the first door on the left as you exit the elevator.  We have sent the following medications to your pharmacy for you to pick up at your convenience:  DISCONTINUE; pantoprazole  START: esomeprazole  40mg  one capsule 30 minutes before breakfast and lunch each day CONTINUE: Ondanzertron ODT 4mg  dissolve on tongue every 8 hours as needed for nausea and vomiting  You have been scheduled for a CT scan of the abdomen and pelvis at [facility], 1st floor Radiology. You are scheduled on 07-17-24 at 5:30pm. You should arrive at 3:15pm to drink contrast at 3:30pm and 4:30pm.  You may take any medications as prescribed with a small amount of water, if necessary. If you take any of the following medications: METFORMIN , GLUCOPHAGE , GLUCOVANCE, AVANDAMET, RIOMET , FORTAMET , ACTOPLUS MET, JANUMET, GLUMETZA  or METAGLIP, you MAY be asked to HOLD this medication 48 hours AFTER the exam.   The purpose of you drinking the oral contrast is to aid in the visualization of your intestinal tract. The contrast solution may cause some diarrhea. Depending on your individual set of symptoms, you may also receive an intravenous injection of x-ray contrast/dye. Plan on being at [facility] for 45 minutes or longer, depending on the type of exam you are having performed.   If you have any questions regarding your exam or if you need to reschedule, you may call [facility] at [phone] between the hours of 8:00 am and 5:00 pm, Monday-Friday.   Due to recent changes in healthcare laws, you may see the results of your imaging and laboratory studies on MyChart before your provider has had a chance to review them.  We understand that in some cases there may be results that  are confusing or concerning to you. Not all laboratory results come back in the same time frame and the provider may be waiting for multiple results in order to interpret others.  Please give us  48 hours in order for  your provider to thoroughly review all the results before contacting the office for clarification of your results.   Thank you for trusting me with your gastrointestinal care!   Elida Shawl, CRNP

## 2024-07-14 ENCOUNTER — Ambulatory Visit: Payer: Self-pay

## 2024-07-14 NOTE — Telephone Encounter (Signed)
 Appt scheduled

## 2024-07-14 NOTE — Telephone Encounter (Signed)
 FYI Only or Action Required?: FYI only for provider: appointment scheduled on 1/21.  Patient was last seen in primary care on 04/24/2024 by Daryl Setter, NP.  Called Nurse Triage reporting Ear Fullness.  Symptoms began several days ago.  Interventions attempted: Rest, hydration, or home remedies.  Symptoms are: gradually worsening.  Triage Disposition: See Physician Within 24 Hours  Patient/caregiver understands and will follow disposition?: Yes  Message from Harlene ORN sent at 07/14/2024  2:48 PM EST  Reason for Triage:  fluid in both of her ears. Headache and dizziness. Started Friday.   Reason for Disposition  Earache  (Exceptions: Brief ear pain of lasting less than 60 minutes, or earache occurring during air travel.)  Answer Assessment - Initial Assessment Questions Fluid in her ears with earache starting Friday. Denies fever. Has headache and mild dizziness. Right ear worse than left with decreased hearing in right.   Starts every year around this time. ENT had to place tubes in her ears last year.  Denies CP, SOB, Fever, vision changes, sinus congestion, URI symptoms.   Appt with PCP 1/21 to assess. ED/UC precautions understood  1. LOCATION: Which ear is involved?     Both ears- right worse than left  2. ONSET: When did the ear pain start?      Friday  3. SEVERITY: How bad is the pain?  (Scale 1-10; mild, moderate or severe)     Bearable about 5/10   4. URI SYMPTOMS: Do you have a runny nose or cough?     Headache, dizziness 5. FEVER: Do you have a fever? If Yes, ask: What is your temperature, how was it measured, and when did it start?     denies 6. CAUSE: Have you been swimming recently?, How often do you use Q-TIPS?, Have you had any recent air travel or scuba diving?     Denies  7. OTHER SYMPTOMS: Do you have any other symptoms? (e.g., decreased hearing, dizziness, headache, stiff neck, vomiting)     Headache, hard to hear right ear,  dizziness  Protocols used: Earache-A-AH

## 2024-07-15 ENCOUNTER — Telehealth: Payer: Self-pay | Admitting: Family

## 2024-07-15 ENCOUNTER — Ambulatory Visit (INDEPENDENT_AMBULATORY_CARE_PROVIDER_SITE_OTHER): Admitting: Family

## 2024-07-15 ENCOUNTER — Ambulatory Visit: Payer: Self-pay | Admitting: Family

## 2024-07-15 VITALS — BP 120/73 | HR 76 | Temp 97.7°F | Resp 16 | Ht 64.0 in | Wt 223.0 lb

## 2024-07-15 DIAGNOSIS — H6690 Otitis media, unspecified, unspecified ear: Secondary | ICD-10-CM | POA: Diagnosis not present

## 2024-07-15 DIAGNOSIS — E785 Hyperlipidemia, unspecified: Secondary | ICD-10-CM | POA: Diagnosis not present

## 2024-07-15 DIAGNOSIS — R0789 Other chest pain: Secondary | ICD-10-CM | POA: Diagnosis not present

## 2024-07-15 DIAGNOSIS — E1165 Type 2 diabetes mellitus with hyperglycemia: Secondary | ICD-10-CM

## 2024-07-15 DIAGNOSIS — M5412 Radiculopathy, cervical region: Secondary | ICD-10-CM | POA: Diagnosis not present

## 2024-07-15 DIAGNOSIS — M5416 Radiculopathy, lumbar region: Secondary | ICD-10-CM | POA: Diagnosis not present

## 2024-07-15 DIAGNOSIS — I25118 Atherosclerotic heart disease of native coronary artery with other forms of angina pectoris: Secondary | ICD-10-CM

## 2024-07-15 LAB — HEMOGLOBIN A1C: Hgb A1c MFr Bld: 6.9 % — ABNORMAL HIGH (ref 4.6–6.5)

## 2024-07-15 LAB — LIPID PANEL
Cholesterol: 155 mg/dL (ref 28–200)
HDL: 43 mg/dL
LDL Cholesterol: 78 mg/dL (ref 10–99)
NonHDL: 112.21
Total CHOL/HDL Ratio: 4
Triglycerides: 173 mg/dL — ABNORMAL HIGH (ref 10.0–149.0)
VLDL: 34.6 mg/dL (ref 0.0–40.0)

## 2024-07-15 LAB — HEPATIC FUNCTION PANEL
ALT: 18 U/L (ref 3–35)
AST: 16 U/L (ref 5–37)
Albumin: 4.2 g/dL (ref 3.5–5.2)
Alkaline Phosphatase: 107 U/L (ref 39–117)
Bilirubin, Direct: 0 mg/dL — ABNORMAL LOW (ref 0.1–0.3)
Total Bilirubin: 0.3 mg/dL (ref 0.2–1.2)
Total Protein: 7.2 g/dL (ref 6.0–8.3)

## 2024-07-15 MED ORDER — CIPROFLOXACIN-DEXAMETHASONE 0.3-0.1 % OT SUSP: OTIC | 0 refills | Status: AC

## 2024-07-15 MED ORDER — GABAPENTIN 100 MG PO CAPS: 100.0000 mg | ORAL_CAPSULE | Freq: Three times a day (TID) | ORAL | 3 refills | Status: AC

## 2024-07-15 MED ORDER — AMOXICILLIN 500 MG PO CAPS: 500.0000 mg | ORAL_CAPSULE | Freq: Three times a day (TID) | ORAL | 0 refills | Status: AC

## 2024-07-15 NOTE — Assessment & Plan Note (Addendum)
 Lab Results  Component Value Date   CHOL 137 08/09/2023   HDL 41.80 08/09/2023   LDLCALC 67 08/09/2023   LDLDIRECT 76 07/21/2019   TRIG 144.0 08/09/2023   CHOLHDL 3 08/09/2023   Last lipid panel almost a year ago. Cholesterol was at goal with coronary disease. Repatha  used for management. - Ordered lipid panel to update cholesterol levels.

## 2024-07-15 NOTE — Assessment & Plan Note (Signed)
 Recommend tylenol  prn.  Call if symptoms worsen or if symptoms fail to improve.

## 2024-07-15 NOTE — Progress Notes (Signed)
" ° °  Acute Office Visit  Subjective:     Patient ID: [name], female    DOB: 08/30/1960, 64 y.o.   MRN: [id number]  Chief Complaint  Patient presents with   Ear Pain    Patient complains of ear pain and fullness, bilateral   Dizziness    Patient complains of dizziness    Dizziness Associated symptoms include chest pain and myalgias.   Patient is in today reporting ear fullness bilaterally for 1 week that is worse in her right ear. She associates dizziness with the fullness and reports 5/10 aching pain. Relieved by ciprofloxacin -dexamethasone  medication given by ENT would like refill. Right arm pain from elbow to shoulder 4/10 aching pain that started last week. She has tried a lotion for muscle pain, pain patch, stretching and working out the arm with no relief. In addition, left knee pain from knee to ankle 4/10 pain feels like she needs to move the leg all night long. Lastly chest wall soreness, shortness of breath, and increased feeling of tiredness for last couple of weeks.  Review of Systems  Constitutional: Negative.   HENT:  Positive for ear pain and hearing loss.        Reports feeling like she is in a tunnel and can not hear as well  Respiratory:  Positive for shortness of breath.        Reports ongoing issue for the last couple of weeks states its with activity.  Cardiovascular:  Positive for chest pain.       Reports pain as chest wall soreness   Gastrointestinal:  Negative for heartburn.  Musculoskeletal:  Positive for myalgias.       Right arm and left knee   Neurological:  Positive for dizziness.      Objective:    BP 120/73 (BP Location: Right Arm, Patient Position: Sitting, Cuff Size: Large)   Pulse 76   Temp 97.7 F (36.5 C) (Oral)   Resp 16   Ht 5' 4 (1.626 m)   Wt 223 lb (101.2 kg)   SpO2 100%   BMI 38.28 kg/m   Physical Exam Constitutional:      Appearance: Normal appearance.  HENT:     Head: Normocephalic.     Right Ear: There is  impacted cerumen.     Left Ear: There is no impacted cerumen.     Ears:     Comments: Difficulty visualizing right ear TM due to cerumen in canal. Patient has tube in this ear.  Left TM noted to be red. Cardiovascular:     Rate and Rhythm: Normal rate and regular rhythm.     Heart sounds: Normal heart sounds.  Pulmonary:     Effort: Pulmonary effort is normal.     Breath sounds: Normal breath sounds.  Neurological:     Mental Status: She is alert and oriented to person, place, and time.  Psychiatric:        Mood and Affect: Mood normal.       Assessment & Plan:  Otitis Media  -ciprofloxacin -dexamethasone  refilled -Amoxicillin  rx sent  -instructed patient to follow up with ENT   Cervical radiculopathy  -gabapentin  rx  Lumbar radiculopathy -gabapentin  rx  Musculoskeletal chest pain -cardiology appointment next week   Type 2 diabetes -A1C -hepatic function panel  -lipid panel  Follow up in 3 months   Baley Shands FNP-student    "

## 2024-07-15 NOTE — Assessment & Plan Note (Addendum)
" °  Right arm pain possibly related to cervical radiculopathy.  - Prescribed gabapentin  at a low dose TID as needed, with option to take at bedtime if drowsiness occurs. "

## 2024-07-15 NOTE — Progress Notes (Signed)
 "  Subjective:     Patient ID: [name], female    DOB: 19-Oct-1960, 64 y.o.   MRN: [id number]  Chief Complaint  Patient presents with   Ear Pain    Patient complains of ear pain and fullness, bilateral   Dizziness    Patient complains of dizziness    HPI  Discussed the use of AI scribe software for clinical note transcription with the patient, who gave verbal consent to proceed.  History of Present Illness [name] is a 64 year old female who presents with ear fullness and hearing difficulties.  She has been experiencing ear fullness and a sensation of being in a tunnel, with difficulty hearing in both ears, starting last week. The symptoms began on the right side and are associated with a 5 out of 10 aching pain. She has been using ciprofloxacin  ear drops prescribed by her ENT and requests a refill. Dizziness accompanies the ear pain. She has a long-standing skin condition that causes crusty lesions and dry skin, particularly affecting her ears.  She describes right arm pain that started last week, characterized as aching from the elbow to the shoulder. She has been using muscle pain lotion and pain patches, and finds some relief with stretching and exercise.  She reports left knee pain extending down the leg, distinct from her known arthritis. This pain wakes her every night and she feels a constant need to move the leg, rating the pain as 4 out of 10.  She mentions experiencing chest pain and shortness of breath over the past couple of weeks, describing the chest pain as 'sore' and noting increased fatigue. She has a follow-up cardiology appointment scheduled this week. No recent falls or trauma. Describes chest pain as tender to touch and denies any recent injury.      Health Maintenance Due  Topic Date Due   OPHTHALMOLOGY EXAM  Never done   COVID-19 Vaccine (6 - 2025-26 season) 02/24/2024   Hepatitis B Vaccines 19-59 Average Risk (3 of 3 - 19+ 3-dose series)  06/07/2024    Past Medical History:  Diagnosis Date   Allergy    allergic rhinitis   Arthritis    Atypical chest pain    Constipation    Cyst, ovarian    Diabetes (HCC)    Diabetes mellitus, type II (HCC)    Elevated alkaline phosphatase level    Endometriosis    Esophageal stricture    Fatty liver    Gastroparesis    GERD (gastroesophageal reflux disease)    Heart murmur    Hepatic hemangioma    Hyperlipidemia    Hypertension    Hypertrophic condition of skin    acrokeratoelastoidosis- s/p 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 1/08- benign   Iron  deficiency anemia    Migraine headache    Non-compliance    Peptic ulcer disease    Sleep apnea    Thyroid  disease     Past Surgical History:  Procedure Laterality Date   ABDOMINAL EXPLORATION SURGERY     w/bso    BREAST BIOPSY     CARDIAC CATHETERIZATION  2009   mild non obstructive CAD   CHOLECYSTECTOMY     COLONOSCOPY     ESOPHAGEAL MANOMETRY N/A 08/01/2020   Procedure: ESOPHAGEAL MANOMETRY (EM);  Surgeon: Leigh Elspeth SQUIBB, MD;  Location: [facility];  Service: Gastroenterology;  Laterality: N/A;   KNEE SURGERY  2005    left knee   LEFT HEART CATH AND CORONARY ANGIOGRAPHY N/A 07/17/2017  Procedure: LEFT HEART CATH AND CORONARY ANGIOGRAPHY;  Surgeon: Jordan, Peter M, MD;  Location: [facility] INVASIVE CV LAB;  Service: Cardiovascular;  Laterality: N/A;   LEFT HEART CATH AND CORONARY ANGIOGRAPHY N/A 09/01/2021   Procedure: LEFT HEART CATH AND CORONARY ANGIOGRAPHY;  Surgeon: Verlin Lonni BIRCH, MD;  Location: [facility] INVASIVE CV LAB;  Service: Cardiovascular;  Laterality: N/A;   POLYPECTOMY     TOTAL ABDOMINAL HYSTERECTOMY     UPPER GASTROINTESTINAL ENDOSCOPY      Family History  Problem Relation Age of Onset   Hypertension Mother    Rheum arthritis Mother    Hypertension Father    Diabetes Father    Prostate cancer Father    Kidney disease Father    Diabetes Sister    Fibromyalgia Sister    Diabetes Maternal Grandmother    Lung cancer  Maternal Grandfather    Arthritis Brother    Migraines Brother    CAD Brother    Fibromyalgia Sister    Migraines Sister    Colon cancer Neg Hx    Thyroid  disease Neg Hx    Colon polyps Neg Hx     Social History   Socioeconomic History   Marital status: Widowed    Spouse name: Not on file   Number of children: 2   Years of education: Not on file   Highest education level: Not on file  Occupational History   Occupation: DISABILITY    Employer: UNEMPLOYED  Tobacco Use   Smoking status: Former    Current packs/day: 0.00    Average packs/day: 0.5 packs/day for 18.0 years (9.0 ttl pk-yrs)    Types: Cigarettes    Start date: 07/29/1977    Quit date: 06/25/1994    Years since quitting: 30.0   Smokeless tobacco: Never   Tobacco comments:    quit 19 years ago  Vaping Use   Vaping status: Never Used  Substance and Sexual Activity   Alcohol use: Yes    Alcohol/week: 0.0 standard drinks of alcohol    Comment: social drinker   Drug use: No   Sexual activity: Not Currently    Birth control/protection: Post-menopausal, Surgical  Other Topics Concern   Not on file  Social History Narrative   Widowed   Has 2 grown children.  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    Former Smoker Quit tobacco in 1996.  She was a pack a day smoker for approximately 10 years.    Alcohol use-yes: Social    Daily Caffeine Use:6 pack of pepsi daily     Illicit Drug Use - no    Patient does not get regular exercise.       Smoking Status:  quit      Right handed   Living alone, 5 steps to up entrance of the house   Retired    Social Drivers of Health   Tobacco Use: Medium Risk (07/10/2024)   Patient History    Smoking Tobacco Use: Former    Smokeless Tobacco Use: Never    Passive Exposure: Not on file  Financial Resource Strain: Low Risk (01/16/2023)   Overall Financial Resource Strain (CARDIA)    Difficulty of Paying Living Expenses: Not hard at all   Food Insecurity: No Food Insecurity (06/10/2024)   Epic    Worried About Radiation Protection Practitioner of Food in the Last Year: Never true    Ran Out of Food in the Last Year: Never true  Transportation  Needs: No Transportation Needs (06/10/2024)   Epic    Lack of Transportation (Medical): No    Lack of Transportation (Non-Medical): No  Physical Activity: Inactive (06/10/2024)   Exercise Vital Sign    Days of Exercise per Week: 0 days    Minutes of Exercise per Session: 0 min  Stress: No Stress Concern Present (06/10/2024)   [employer] of Occupational Health - Occupational Stress Questionnaire    Feeling of Stress: Not at all  Social Connections: Moderately Isolated (06/10/2024)   Social Connection and Isolation Panel    Frequency of Communication with Friends and Family: More than three times a week    Frequency of Social Gatherings with Friends and Family: More than three times a week    Attends Religious Services: More than 4 times per year    Active Member of [employer] or Organizations: No    Attends Banker Meetings: Never    Marital Status: Widowed  Intimate Partner Violence: Not At Risk (06/10/2024)   Epic    Fear of Current or Ex-Partner: No    Emotionally Abused: No    Physically Abused: No    Sexually Abused: No  Depression (PHQ2-9): Low Risk (06/10/2024)   Depression (PHQ2-9)    PHQ-2 Score: 3  Alcohol Screen: Low Risk (06/10/2023)   Alcohol Screen    Last Alcohol Screening Score (AUDIT): 2  Housing: Low Risk (06/10/2024)   Epic    Unable to Pay for Housing in the Last Year: No    Number of Times Moved in the Last Year: 0    Homeless in the Last Year: No  Utilities: Not At Risk (06/10/2024)   Epic    Threatened with loss of utilities: No  Health Literacy: Adequate Health Literacy (01/16/2023)   B1300 Health Literacy    Frequency of need for help with medical instructions: Never    Outpatient Medications Prior to Visit  Medication Sig Dispense Refill    Accu-Chek Softclix Lancets lancets 3 (three) times daily.     albuterol  (VENTOLIN  HFA) 108 (90 Base) MCG/ACT inhaler INHALE 2 PUFFS BY MOUTH 4 TIMES DAILY 9 g 0   alclomethasone (ACLOVATE ) 0.05 % cream Apply topically 2 (two) times daily as needed (Rash). 180 g 3   amLODipine  (NORVASC ) 10 MG tablet Take 1 tablet (10 mg total) by mouth daily. 90 tablet 1   aspirin  81 MG chewable tablet Chew 1 tablet (81 mg total) by mouth daily.     Blood Glucose Monitoring Suppl DEVI 1 each by Does not apply route in the morning, at noon, and at bedtime. May substitute to any manufacturer covered by patient's insurance. 1 each 0   chlorthalidone  (HYGROTON ) 25 MG tablet Take 1 tablet (25 mg total) by mouth daily. 90 tablet 3   clobetasol  (TEMOVATE ) 0.05 % external solution Apply 1 application topically 2 (two) times daily. 50 mL 6   DULoxetine  (CYMBALTA ) 60 MG capsule Take 1 capsule (60 mg total) by mouth daily. 90 capsule 1   esomeprazole  (NEXIUM ) 40 MG capsule Take 1 capsule (40 mg total) by mouth 2 (two) times daily before a meal. 60 capsule 2   Evolocumab  (REPATHA  SURECLICK) 140 MG/ML SOAJ INJECT 140 MG INTO THE SKIN EVERY 14 DAYS 6 mL 3   FARXIGA  5 MG TABS tablet Take 1 tablet (5 mg total) by mouth daily before breakfast. 90 tablet 1   fluticasone  (FLONASE ) 50 MCG/ACT nasal spray Place 2 sprays into both nostrils daily. 16 g 1  furosemide  (LASIX ) 20 MG tablet Take 1 tablet (20 mg total) by mouth daily as needed (for shortness of breath or weight gain of 2 lbs overnight or 5 lbs in a week). 45 tablet 3   glucose blood (ACCU-CHEK GUIDE TEST) test strip USE 1 EACH BY IN VITRO ROUTE IN ETH MORNING, AT NOON, AND AT BEDTIME 100 each 2   isosorbide  mononitrate (IMDUR ) 60 MG 24 hr tablet Take 1.5 tablets (90 mg total) by mouth daily. 135 tablet 2   loratadine  (CLARITIN ) 10 MG tablet Take 1 tablet (10 mg total) by mouth daily. 10 tablet 0   meclizine  (ANTIVERT ) 12.5 MG tablet Take 1 tablet (12.5 mg total) by mouth 3  (three) times daily as needed for dizziness. 30 tablet 0   meloxicam  (MOBIC ) 7.5 MG tablet Take 1 tablet (7.5 mg total) by mouth daily. 30 tablet 5   methimazole  (TAPAZOLE ) 5 MG tablet Take 5 mg every other day 30 tablet 5   methocarbamol  (ROBAXIN ) 500 MG tablet Take 1 tablet (500 mg total) by mouth at bedtime as needed for muscle spasms. 30 tablet 2   metoCLOPramide  (REGLAN ) 5 MG tablet TAKE 1 TABLET BY MOUTH THREE TIMES DAILY BEFORE MEAL(S). 90 tablet 0   Multiple Vitamins-Minerals (MULTIVITAMIN WITH MINERALS) tablet Take 1 tablet by mouth daily.     ondansetron  (ZOFRAN -ODT) 4 MG disintegrating tablet Take 1 tablet (4 mg total) by mouth every 8 (eight) hours as needed for nausea or vomiting. 30 tablet 1   potassium chloride  SA (KLOR-CON  M20) 20 MEQ tablet Take 2 tablets (40 mEq total) by mouth daily. 180 tablet 3   rosuvastatin  (CRESTOR ) 40 MG tablet Take 1 tablet (40 mg total) by mouth daily. 90 tablet 1   SUMAtriptan  (IMITREX ) 50 MG tablet Take 50 mg by mouth daily as needed for migraine.     tretinoin  (RETIN-A ) 0.05 % cream Apply topically at bedtime. apply pea size (1gm) amount to face 2-3 nights a week. 45 g 0   Vitamin D , Ergocalciferol , (DRISDOL ) 1.25 MG (50000 UNIT) CAPS capsule Take 1 capsule (50,000 Units total) by mouth every 7 (seven) days. 12 capsule 0   XIIDRA 5 % SOLN Apply 1 drop to eye 2 (two) times daily.     No facility-administered medications prior to visit.    Allergies[1]  ROS See HPI    Objective:    Physical Exam Constitutional:      General: She is not in acute distress.    Appearance: Normal appearance. She is well-developed.  HENT:     Head: Normocephalic and atraumatic.     Right Ear: External ear normal.     Left Ear: External ear normal.     Ears:     Comments: Limited visualization of TM's due to ceruminosis Eyes:     General: No scleral icterus. Neck:     Thyroid : No thyromegaly.  Cardiovascular:     Rate and Rhythm: Normal rate and regular  rhythm.     Heart sounds: Normal heart sounds. No murmur heard. Pulmonary:     Effort: Pulmonary effort is normal. No respiratory distress.     Breath sounds: Normal breath sounds. No wheezing.  Musculoskeletal:     Cervical back: Neck supple.     Comments: Reproducible left anterior chest wall tenderness to palpation  Skin:    General: Skin is warm and dry.     Comments: Diffuse hyperpigmented skin papules  Neurological:     Mental Status: She is alert and  oriented to person, place, and time.  Psychiatric:        Mood and Affect: Mood normal.        Behavior: Behavior normal.        Thought Content: Thought content normal.        Judgment: Judgment normal.      BP 120/73 (BP Location: Right Arm, Patient Position: Sitting, Cuff Size: Large)   Pulse 76   Temp 97.7 F (36.5 C) (Oral)   Resp 16   Ht 5' 4 (1.626 m)   Wt 223 lb (101.2 kg)   SpO2 100%   BMI 38.28 kg/m  Wt Readings from Last 3 Encounters:  07/15/24 223 lb (101.2 kg)  07/10/24 224 lb 6 oz (101.8 kg)  06/10/24 216 lb (98 kg)       Assessment & Plan:   Problem List Items Addressed This Visit       Unprioritized   Type 2 diabetes mellitus with hyperglycemia, without long-term current use of insulin  (HCC)    A1c was 6.9 four months ago. Recent dietary indiscretion may have affected glucose levels. - Ordered A1c test today to assess current glycemic control.      Relevant Orders   HgB A1c   Otitis media - Primary    Bilateral ear fullness and pain with dizziness. ENT prescribed ciprofloxacin -dexamethasone  drops for her right ear previously. Pt requesting refill. Possible otitis media due to symptoms. Wax obstruction in the right ear impeding eardrum visualization. Tube may be occluded. - Prescribed empiric amoxicillin  500 mg TID for 7 days for otitis media. - Advised ENT follow-up for ear examination and wax removal. - Continue ciprofloxacin -dexamethasone  drops as needed for ear canal soreness.        Relevant Medications   ciprofloxacin -dexamethasone  (CIPRODEX ) OTIC suspension   amoxicillin  (AMOXIL ) 500 MG capsule   Musculoskeletal chest pain   Recommend tylenol  prn.  Call if symptoms worsen or if symptoms fail to improve.       Hyperlipidemia   Lab Results  Component Value Date   CHOL 137 08/09/2023   HDL 41.80 08/09/2023   LDLCALC 67 08/09/2023   LDLDIRECT 76 07/21/2019   TRIG 144.0 08/09/2023   CHOLHDL 3 08/09/2023   Last lipid panel almost a year ago. Cholesterol was at goal with coronary disease. Repatha  used for management. - Ordered lipid panel to update cholesterol levels.      Relevant Orders   Hepatic function panel   Lipid panel   Cervical radiculopathy    Right arm pain possibly related to cervical radiculopathy.  - Prescribed gabapentin  at a low dose TID as needed, with option to take at bedtime if drowsiness occurs.      Relevant Medications   gabapentin  (NEURONTIN ) 100 MG capsule   Assessment & Plan     I am having Yolando G. Ringgold start on ciprofloxacin -dexamethasone , amoxicillin , and gabapentin . I am also having her maintain her aspirin , loratadine , fluticasone , alclomethasone, clobetasol , albuterol , Xiidra, furosemide , Blood Glucose Monitoring Suppl, Accu-Chek Softclix Lancets, chlorthalidone , potassium chloride  SA, meclizine , SUMAtriptan , Accu-Chek Guide Test, multivitamin with minerals, DULoxetine , methocarbamol , Repatha  SureClick, tretinoin , Vitamin D  (Ergocalciferol ), meloxicam , amLODipine , methimazole , rosuvastatin , metoCLOPramide , isosorbide  mononitrate, Farxiga , esomeprazole , and ondansetron .  Meds ordered this encounter  Medications   ciprofloxacin -dexamethasone  (CIPRODEX ) OTIC suspension    Sig: 4 drops to affected ear twice daily for 1 week if needed    Dispense:  7.5 mL    Refill:  0    Supervising Provider:   DOMENICA BLACKBIRD A [[id number]]  amoxicillin  (AMOXIL ) 500 MG capsule    Sig: Take 1 capsule (500 mg total) by mouth 3 (three) times  daily for 10 days.    Dispense:  21 capsule    Refill:  0    Supervising Provider:   DOMENICA BLACKBIRD A [[id number]]   gabapentin  (NEURONTIN ) 100 MG capsule    Sig: Take 1 capsule (100 mg total) by mouth 3 (three) times daily.    Dispense:  90 capsule    Refill:  3    Supervising Provider:   DOMENICA BLACKBIRD A [[id number]]      [1]  Allergies Allergen Reactions   Celebrex [Celecoxib] Other (See Comments)    makes me bleed   Diovan  [Valsartan ] Other (See Comments)    angioedema   Metformin  Anxiety    Vomiting, Anxiety, Cramping, Diarrhea   Ace Inhibitors Cough   "

## 2024-07-15 NOTE — Assessment & Plan Note (Addendum)
" °  Bilateral ear fullness and pain with dizziness. ENT prescribed ciprofloxacin -dexamethasone  drops for her right ear previously. Pt requesting refill. Possible otitis media due to symptoms. Wax obstruction in the right ear impeding eardrum visualization. Tube may be occluded. - Prescribed empiric amoxicillin  500 mg TID for 7 days for otitis media. - Advised ENT follow-up for ear examination and wax removal. - Continue ciprofloxacin -dexamethasone  drops as needed for ear canal soreness.  "

## 2024-07-15 NOTE — Assessment & Plan Note (Signed)
 Pt advised to keep upcoming cardiology appointment.

## 2024-07-15 NOTE — Assessment & Plan Note (Signed)
" °  A1c was 6.9 four months ago. Recent dietary indiscretion may have affected glucose levels. - Ordered A1c test today to assess current glycemic control. "

## 2024-07-15 NOTE — Patient Instructions (Signed)
" °  VISIT SUMMARY: During your visit, we discussed your ear fullness and hearing difficulties, right arm pain, left knee pain, chest pain, and shortness of breath. We also reviewed your diabetes and cholesterol management.  YOUR PLAN: -OTITIS MEDIA: You have an infection in both the middle and outer parts of your ears, causing fullness, pain, and dizziness. We prescribed amoxicillin  500 mg three times a day for 7 days to treat the middle ear infection. Continue using the ciprofloxacin -dexamethasone  drops as needed for the outer ear infection. Follow up with your ENT for an ear examination and wax removal.  -LUMBAR AND CERVICAL RADICULOPATHY: Your right arm pain and left knee pain may be due to nerve issues in your neck and lower back. We prescribed gabapentin  at a low dose three times a day as needed, or you can take it at bedtime if it makes you drowsy.  -MUSCULOSKELETAL CHEST PAIN: Your chest pain and shortness of breath are likely due to muscle and bone issues rather than heart problems. Keep your cardiology appointment on January 28th for further evaluation. You can take Tylenol  to manage the pain.  -TYPE 2 DIABETES MELLITUS WITH HYPERGLYCEMIA: Your blood sugar levels were slightly elevated four months ago. We ordered an A1c test today to check your current blood sugar control.  -HYPERLIPIDEMIA: Your cholesterol levels were at goal almost a year ago. We ordered a lipid panel to update your cholesterol levels.  INSTRUCTIONS: Please follow up with your ENT for an ear examination and wax removal. Keep your cardiology appointment on January 28th for further evaluation. We will contact you with the results of your A1c test and lipid panel.    Contains text generated by [employer].   "

## 2024-07-15 NOTE — Telephone Encounter (Signed)
 Letter mailed out to pt

## 2024-07-15 NOTE — Telephone Encounter (Signed)
 Please call [facility] on [address] to request copy of DM eye exam.

## 2024-07-16 ENCOUNTER — Other Ambulatory Visit: Payer: Self-pay | Admitting: Gastroenterology

## 2024-07-17 ENCOUNTER — Encounter: Payer: Self-pay | Admitting: Internal Medicine

## 2024-07-17 ENCOUNTER — Ambulatory Visit: Admitting: Internal Medicine

## 2024-07-17 ENCOUNTER — Ambulatory Visit (HOSPITAL_COMMUNITY)
Admission: RE | Admit: 2024-07-17 | Discharge: 2024-07-17 | Disposition: A | Source: Ambulatory Visit | Attending: Nurse Practitioner

## 2024-07-17 VITALS — BP 127/69 | HR 68 | Temp 97.3°F | Resp 16 | Ht 64.0 in | Wt 226.0 lb

## 2024-07-17 DIAGNOSIS — M1712 Unilateral primary osteoarthritis, left knee: Secondary | ICD-10-CM | POA: Diagnosis not present

## 2024-07-17 DIAGNOSIS — K3184 Gastroparesis: Secondary | ICD-10-CM | POA: Diagnosis present

## 2024-07-17 DIAGNOSIS — M25551 Pain in right hip: Secondary | ICD-10-CM | POA: Insufficient documentation

## 2024-07-17 DIAGNOSIS — M2550 Pain in unspecified joint: Secondary | ICD-10-CM

## 2024-07-17 DIAGNOSIS — M797 Fibromyalgia: Secondary | ICD-10-CM | POA: Diagnosis not present

## 2024-07-17 DIAGNOSIS — R112 Nausea with vomiting, unspecified: Secondary | ICD-10-CM | POA: Diagnosis present

## 2024-07-17 DIAGNOSIS — R7689 Other specified abnormal immunological findings in serum: Secondary | ICD-10-CM

## 2024-07-17 DIAGNOSIS — M7541 Impingement syndrome of right shoulder: Secondary | ICD-10-CM

## 2024-07-17 DIAGNOSIS — R140 Abdominal distension (gaseous): Secondary | ICD-10-CM | POA: Diagnosis present

## 2024-07-17 MED ORDER — METHOCARBAMOL 500 MG PO TABS: 500.0000 mg | ORAL_TABLET | Freq: Every evening | ORAL | 1 refills | Status: AC | PRN

## 2024-07-17 MED ORDER — IOHEXOL 9 MG/ML PO SOLN
1000.0000 mL | Freq: Once | ORAL | Status: AC
  Administered 2024-07-17: 1000 mL via ORAL

## 2024-07-17 MED ORDER — IOHEXOL 300 MG/ML  SOLN
100.0000 mL | Freq: Once | INTRAMUSCULAR | Status: AC | PRN
  Administered 2024-07-17: 100 mL via INTRAVENOUS

## 2024-07-17 MED ORDER — DULOXETINE HCL 60 MG PO CPEP: 60.0000 mg | ORAL_CAPSULE | Freq: Every day | ORAL | 1 refills | Status: AC

## 2024-07-22 ENCOUNTER — Ambulatory Visit: Admitting: Physician Assistant

## 2024-07-25 MED ORDER — TRIAMCINOLONE ACETONIDE 40 MG/ML IJ SUSP
40.0000 mg | INTRAMUSCULAR | Status: AC | PRN
  Administered 2024-07-17: 40 mg via INTRA_ARTICULAR

## 2024-07-25 MED ORDER — LIDOCAINE HCL 1 % IJ SOLN
2.0000 mL | INTRAMUSCULAR | Status: AC | PRN
  Administered 2024-07-17: 2 mL

## 2024-07-27 NOTE — Telephone Encounter (Signed)
 Request faxed

## 2024-07-27 NOTE — Telephone Encounter (Signed)
 Were you able to request this? Thanks.

## 2024-07-28 ENCOUNTER — Ambulatory Visit: Admitting: Family

## 2024-07-31 ENCOUNTER — Ambulatory Visit

## 2024-07-31 ENCOUNTER — Ambulatory Visit: Payer: 59 | Admitting: Internal Medicine

## 2024-07-31 ENCOUNTER — Other Ambulatory Visit

## 2024-07-31 ENCOUNTER — Encounter: Payer: Self-pay | Admitting: Internal Medicine

## 2024-07-31 VITALS — BP 142/70 | HR 82 | Ht 64.0 in | Wt 220.0 lb

## 2024-07-31 DIAGNOSIS — E538 Deficiency of other specified B group vitamins: Secondary | ICD-10-CM

## 2024-07-31 DIAGNOSIS — E0590 Thyrotoxicosis, unspecified without thyrotoxic crisis or storm: Secondary | ICD-10-CM

## 2024-07-31 LAB — T4, FREE: Free T4: 1.3 ng/dL (ref 0.8–1.8)

## 2024-07-31 LAB — T3, FREE: T3, Free: 3.2 pg/mL (ref 2.3–4.2)

## 2024-07-31 LAB — TSH: TSH: 1.77 m[IU]/L (ref 0.40–4.50)

## 2024-07-31 MED ORDER — CYANOCOBALAMIN 1000 MCG/ML IJ SOLN
1000.0000 ug | Freq: Once | INTRAMUSCULAR | Status: AC
  Administered 2024-07-31: 1000 ug via INTRAMUSCULAR

## 2024-07-31 NOTE — Patient Instructions (Signed)
 Please continue Methimazole  5 mg every other day.  Please stop at the lab.  Please return for another visit in a year.

## 2024-07-31 NOTE — Progress Notes (Signed)
 Pt here for monthly B12 injection per Melissa  Last B12 injection: 06/30/2024  Last B12 level:  04/24/2024  B12 1000mcg given IM, and pt tolerated injection well.  Next B12 injection scheduled for: 08/28/2024

## 2024-07-31 NOTE — Progress Notes (Signed)
 Patient ID: [name], female   DOB: Nov 28, 1960, 64 y.o.   MRN: [id number]  HPI  [name] is a 64 y.o.-year-old female, returning for follow-up for thyrotoxicosis, likely due to mild Graves' disease.  She previously saw Dr. Kassie, but last visit with me 1 year ago.  Interim history: She denies tremors, palpitations, heat intolerance, unintentional weight loss, anxiety. She lost 11 lbs since last OV intentionally, by adjusting diet-stopped sodas. Since last visit, she started Gabapentin  for back and leg pain. She is on ABx for ear infection.  She was diagnosed with thyrotoxicosis in 2012.  She did not have thyroid  imaging.  She is on methimazole  5 mg every other day (dose decreased 07/2023) - tolerated well.  I reviewed pt's thyroid  tests: Lab Results  Component Value Date   TSH 2.33 04/06/2024   TSH 0.97 12/20/2023   TSH 1.87 09/13/2023   TSH 2.38 08/02/2023   TSH 1.18 03/22/2023   TSH 4.39 10/19/2022   TSH 0.97 07/27/2022   TSH 4.87 12/29/2021   TSH 1.56 05/05/2021   TSH 2.65 10/21/2020   FREET4 1.4 04/06/2024   FREET4 1.2 12/20/2023   FREET4 1.1 09/13/2023   FREET4 1.2 08/02/2023   FREET4 1.1 03/22/2023   FREET4 0.68 10/19/2022   FREET4 0.88 07/27/2022   FREET4 0.76 12/29/2021   FREET4 1.03 05/05/2021   FREET4 0.70 10/21/2020   T3FREE 3.1 04/06/2024   T3FREE 3.7 12/20/2023   T3FREE 3.4 09/13/2023   T3FREE 3.4 08/02/2023   T3FREE 3.3 03/22/2023   T3FREE 3.5 10/19/2022   T3FREE 2.9 07/27/2022   T3FREE 3.2 01/21/2017   Antithyroid antibodies: Lab Results  Component Value Date   TSI <89 07/27/2022   Pt denies: - feeling nodules in neck - hoarseness - dysphagia - choking She has an esophageal stricture >> she had esophageal stretching in the past. She sees GI.  Pt does not have a FH of thyroid  ds. No FH of thyroid  cancer. No h/o radiation tx to head or neck. No steroid use. No herbal supplements. No Biotin use.  Pt. also has a history of CAD,  well-controlled diabetes - managed by PCP, fatty liver, HL, HTN, hepatic hemangioma, iron  deficiency anemia, GERD, endometriosis, ovarian cysts, migraines, osteoarthritis, low back pain, vitamin D  deficiency.  ROS:  + see HPI  Past Medical History:  Diagnosis Date   Allergy    allergic rhinitis   Arthritis    Atypical chest pain    Constipation    Cyst, ovarian    Diabetes (HCC)    Diabetes mellitus, type II (HCC)    Elevated alkaline phosphatase level    Endometriosis    Esophageal stricture    Fatty liver    Gastroparesis    GERD (gastroesophageal reflux disease)    Heart murmur    Hepatic hemangioma    Hyperlipidemia    Hypertension    Hypertrophic condition of skin    acrokeratoelastoidosis- s/p 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 1/08- benign   Iron  deficiency anemia    Migraine headache    Non-compliance    Peptic ulcer disease    Sleep apnea    Thyroid  disease    Past Surgical History:  Procedure Laterality Date   ABDOMINAL EXPLORATION SURGERY     w/bso    BREAST BIOPSY     CARDIAC CATHETERIZATION  2009   mild non obstructive CAD   CHOLECYSTECTOMY     COLONOSCOPY     ESOPHAGEAL MANOMETRY N/A 08/01/2020   Procedure: ESOPHAGEAL MANOMETRY (  EM);  Surgeon: Leigh Elspeth SQUIBB, MD;  Location: [facility];  Service: Gastroenterology;  Laterality: N/A;   KNEE SURGERY  2005    left knee   LEFT HEART CATH AND CORONARY ANGIOGRAPHY N/A 07/17/2017   Procedure: LEFT HEART CATH AND CORONARY ANGIOGRAPHY;  Surgeon: Jordan, Peter M, MD;  Location: [facility] INVASIVE CV LAB;  Service: Cardiovascular;  Laterality: N/A;   LEFT HEART CATH AND CORONARY ANGIOGRAPHY N/A 09/01/2021   Procedure: LEFT HEART CATH AND CORONARY ANGIOGRAPHY;  Surgeon: Verlin Lonni BIRCH, MD;  Location: [facility] INVASIVE CV LAB;  Service: Cardiovascular;  Laterality: N/A;   POLYPECTOMY     TOTAL ABDOMINAL HYSTERECTOMY     UPPER GASTROINTESTINAL ENDOSCOPY     Social History   Socioeconomic History   Marital status: Widowed     Spouse name: Not on file   Number of children: 2   Years of education: Not on file   Highest education level: Not on file  Occupational History   Occupation: DISABILITY    Employer: UNEMPLOYED  Tobacco Use   Smoking status: Former    Current packs/day: 0.00    Average packs/day: 0.5 packs/day for 18.0 years (9.0 ttl pk-yrs)    Types: Cigarettes    Start date: 07/29/1977    Quit date: 06/25/1994    Years since quitting: 30.1    Passive exposure: Never   Smokeless tobacco: Never   Tobacco comments:    quit 19 years ago  Vaping Use   Vaping status: Never Used  Substance and Sexual Activity   Alcohol use: Yes    Alcohol/week: 0.0 standard drinks of alcohol    Comment: social drinker   Drug use: No   Sexual activity: Not Currently    Birth control/protection: Post-menopausal, Surgical  Other Topics Concern   Not on file  Social History Narrative   Widowed   Has 2 grown children.  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    Former Smoker Quit tobacco in 1996.  She was a pack a day smoker for approximately 10 years.    Alcohol use-yes: Social    Daily Caffeine Use:6 pack of pepsi daily     Illicit Drug Use - no    Patient does not get regular exercise.       Smoking Status:  quit      Right handed   Living alone, 5 steps to up entrance of the house   Retired    Social Drivers of Health   Tobacco Use: Medium Risk (07/17/2024)   Patient History    Smoking Tobacco Use: Former    Smokeless Tobacco Use: Never    Passive Exposure: Never  Physicist, Medical Strain: Low Risk (01/16/2023)   Overall Financial Resource Strain (CARDIA)    Difficulty of Paying Living Expenses: Not hard at all  Food Insecurity: No Food Insecurity (06/10/2024)   Epic    Worried About Programme Researcher, Broadcasting/film/video in the Last Year: Never true    Ran Out of Food in the Last Year: Never true  Transportation Needs: No Transportation Needs (06/10/2024)   Epic    Lack of  Transportation (Medical): No    Lack of Transportation (Non-Medical): No  Physical Activity: Inactive (06/10/2024)   Exercise Vital Sign    Days of Exercise per Week: 0 days    Minutes of Exercise per Session: 0 min  Stress: No Stress Concern Present (06/10/2024)   [employer] of Occupational Health - Occupational  Stress Questionnaire    Feeling of Stress: Not at all  Social Connections: Moderately Isolated (06/10/2024)   Social Connection and Isolation Panel    Frequency of Communication with Friends and Family: More than three times a week    Frequency of Social Gatherings with Friends and Family: More than three times a week    Attends Religious Services: More than 4 times per year    Active Member of [employer] or Organizations: No    Attends Banker Meetings: Never    Marital Status: Widowed  Intimate Partner Violence: Not At Risk (06/10/2024)   Epic    Fear of Current or Ex-Partner: No    Emotionally Abused: No    Physically Abused: No    Sexually Abused: No  Depression (PHQ2-9): Low Risk (06/10/2024)   Depression (PHQ2-9)    PHQ-2 Score: 3  Alcohol Screen: Low Risk (06/10/2023)   Alcohol Screen    Last Alcohol Screening Score (AUDIT): 2  Housing: Low Risk (06/10/2024)   Epic    Unable to Pay for Housing in the Last Year: No    Number of Times Moved in the Last Year: 0    Homeless in the Last Year: No  Utilities: Not At Risk (06/10/2024)   Epic    Threatened with loss of utilities: No  Health Literacy: Adequate Health Literacy (01/16/2023)   B1300 Health Literacy    Frequency of need for help with medical instructions: Never   Current Outpatient Medications on File Prior to Visit  Medication Sig Dispense Refill   Accu-Chek Softclix Lancets lancets 3 (three) times daily.     albuterol  (VENTOLIN  HFA) 108 (90 Base) MCG/ACT inhaler INHALE 2 PUFFS BY MOUTH 4 TIMES DAILY 9 g 0   alclomethasone (ACLOVATE ) 0.05 % cream Apply topically 2 (two) times daily as  needed (Rash). 180 g 3   amLODipine  (NORVASC ) 10 MG tablet Take 1 tablet (10 mg total) by mouth daily. 90 tablet 1   aspirin  81 MG chewable tablet Chew 1 tablet (81 mg total) by mouth daily.     Blood Glucose Monitoring Suppl DEVI 1 each by Does not apply route in the morning, at noon, and at bedtime. May substitute to any manufacturer covered by patient's insurance. 1 each 0   chlorthalidone  (HYGROTON ) 25 MG tablet Take 1 tablet (25 mg total) by mouth daily. 90 tablet 3   ciprofloxacin -dexamethasone  (CIPRODEX ) OTIC suspension 4 drops to affected ear twice daily for 1 week if needed 7.5 mL 0   clobetasol  (TEMOVATE ) 0.05 % external solution Apply 1 application topically 2 (two) times daily. 50 mL 6   DULoxetine  (CYMBALTA ) 60 MG capsule Take 1 capsule (60 mg total) by mouth daily. 90 capsule 1   esomeprazole  (NEXIUM ) 40 MG capsule Take 1 capsule (40 mg total) by mouth 2 (two) times daily before a meal. 60 capsule 2   Evolocumab  (REPATHA  SURECLICK) 140 MG/ML SOAJ INJECT 140 MG INTO THE SKIN EVERY 14 DAYS 6 mL 3   FARXIGA  5 MG TABS tablet Take 1 tablet (5 mg total) by mouth daily before breakfast. 90 tablet 1   fluticasone  (FLONASE ) 50 MCG/ACT nasal spray Place 2 sprays into both nostrils daily. 16 g 1   furosemide  (LASIX ) 20 MG tablet Take 1 tablet (20 mg total) by mouth daily as needed (for shortness of breath or weight gain of 2 lbs overnight or 5 lbs in a week). 45 tablet 3   gabapentin  (NEURONTIN ) 100 MG capsule Take 1 capsule (  100 mg total) by mouth 3 (three) times daily. 90 capsule 3   glucose blood (ACCU-CHEK GUIDE TEST) test strip USE 1 EACH BY IN VITRO ROUTE IN ETH MORNING, AT NOON, AND AT BEDTIME 100 each 2   isosorbide  mononitrate (IMDUR ) 60 MG 24 hr tablet Take 1.5 tablets (90 mg total) by mouth daily. 135 tablet 2   loratadine  (CLARITIN ) 10 MG tablet Take 1 tablet (10 mg total) by mouth daily. 10 tablet 0   meclizine  (ANTIVERT ) 12.5 MG tablet Take 1 tablet (12.5 mg total) by mouth 3  (three) times daily as needed for dizziness. 30 tablet 0   meloxicam  (MOBIC ) 7.5 MG tablet Take 1 tablet (7.5 mg total) by mouth daily. (Patient not taking: Reported on 07/17/2024) 30 tablet 5   methimazole  (TAPAZOLE ) 5 MG tablet Take 5 mg every other day 30 tablet 5   methocarbamol  (ROBAXIN ) 500 MG tablet Take 1 tablet (500 mg total) by mouth at bedtime as needed for muscle spasms. 90 tablet 1   metoCLOPramide  (REGLAN ) 5 MG tablet TAKE 1 TABLET BY MOUTH THREE TIMES DAILY BEFORE MEAL(S). 90 tablet 0   Multiple Vitamins-Minerals (MULTIVITAMIN WITH MINERALS) tablet Take 1 tablet by mouth daily.     ondansetron  (ZOFRAN -ODT) 4 MG disintegrating tablet Take 1 tablet (4 mg total) by mouth every 8 (eight) hours as needed for nausea or vomiting. 30 tablet 1   potassium chloride  SA (KLOR-CON  M20) 20 MEQ tablet Take 2 tablets (40 mEq total) by mouth daily. 180 tablet 3   rosuvastatin  (CRESTOR ) 40 MG tablet Take 1 tablet (40 mg total) by mouth daily. 90 tablet 1   SUMAtriptan  (IMITREX ) 50 MG tablet Take 50 mg by mouth daily as needed for migraine.     tretinoin  (RETIN-A ) 0.05 % cream Apply topically at bedtime. apply pea size (1gm) amount to face 2-3 nights a week. 45 g 0   Vitamin D , Ergocalciferol , (DRISDOL ) 1.25 MG (50000 UNIT) CAPS capsule Take 1 capsule (50,000 Units total) by mouth every 7 (seven) days. 12 capsule 0   XIIDRA 5 % SOLN Apply 1 drop to eye 2 (two) times daily.     No current facility-administered medications on file prior to visit.   Allergies  Allergen Reactions   Celebrex [Celecoxib] Other (See Comments)    makes me bleed   Diovan  [Valsartan ] Other (See Comments)    angioedema   Metformin  Anxiety    Vomiting, Anxiety, Cramping, Diarrhea   Ace Inhibitors Cough   Family History  Problem Relation Age of Onset   Hypertension Mother    Rheum arthritis Mother    Hypertension Father    Diabetes Father    Prostate cancer Father    Kidney disease Father    Diabetes Sister     Fibromyalgia Sister    Diabetes Maternal Grandmother    Lung cancer Maternal Grandfather    Arthritis Brother    Migraines Brother    CAD Brother    Fibromyalgia Sister    Migraines Sister    Colon cancer Neg Hx    Thyroid  disease Neg Hx    Colon polyps Neg Hx    PE: BP (!) 142/70   Pulse 82   Ht 5' 4 (1.626 m)   Wt 220 lb (99.8 kg)   SpO2 96%   BMI 37.76 kg/m  Wt Readings from Last 30 Encounters:  07/31/24 220 lb (99.8 kg)  07/17/24 226 lb (102.5 kg)  07/15/24 223 lb (101.2 kg)  07/10/24 224 lb 6  oz (101.8 kg)  06/10/24 216 lb (98 kg)  04/24/24 218 lb 9.6 oz (99.2 kg)  04/20/24 216 lb (98 kg)  04/20/24 216 lb (98 kg)  03/13/24 220 lb 3.2 oz (99.9 kg)  02/28/24 223 lb (101.2 kg)  02/07/24 223 lb (101.2 kg)  01/20/24 223 lb (101.2 kg)  01/17/24 220 lb (99.8 kg)  01/10/24 223 lb (101.2 kg)  12/23/23 216 lb (98 kg)  12/06/23 219 lb (99.3 kg)  10/25/23 222 lb (100.7 kg)  10/21/23 225 lb 9.6 oz (102.3 kg)  10/04/23 226 lb (102.5 kg)  10/01/23 228 lb 9.6 oz (103.7 kg)  09/02/23 227 lb 6.4 oz (103.1 kg)  08/16/23 227 lb 6.4 oz (103.1 kg)  08/09/23 232 lb (105.2 kg)  08/06/23 233 lb (105.7 kg)  08/06/23 233 lb (105.7 kg)  08/02/23 231 lb 12.8 oz (105.1 kg)  07/18/23 232 lb (105.2 kg)  06/27/23 228 lb 3.2 oz (103.5 kg)  04/26/23 234 lb (106.1 kg)  04/16/23 238 lb (108 kg)  Constitutional: overweight, in NAD Eyes: EOMI, no exophthalmos, no lid lag, no stare ENT: no thyromegaly, no cervical lymphadenopathy Cardiovascular: RRR, No MRG Respiratory: CTA B Musculoskeletal: no deformities Skin: + Papular rash on hands, legs, ears, hairline (from birth) Neurological: no tremor with outstretched hands  ASSESSMENT: 1. Thyrotoxicosis  PLAN:  1. Patient with a history of low TSH, without thyrotoxic symptoms: No weight loss, palpitations, anxiety, heat intolerance, hyperdefecation.  I first saw her 2 years ago -she previously was a patient of Dr. Laymond.  She was on  methimazole  5 mg daily, with normal TFTs.  Her TSI's were undetectable.  In 2024, she had 2 sets of TFTs, which were all normal.  At last visit, in 07/2023, her TFTs remained normal, so I advised her to decrease the methimazole  dose to 5 mg every other day.  She continues on this dose now. - We previously discussed about possible etiology of her thyrotoxicosis including thyroiditis, toxic multinodular goiter or toxic adenoma, but my suspicion is for mild Graves' disease. - at today's visit, she is feeling well, and does not report thyrotoxic signs or symptoms.  She lost 11 pounds since last visit but she mentions that this was intentional, after cutting out soda from her diet. - her heart rate is normal at today's visit.  She was previously on metoprolol , but now off. - she has no active signs of Graves' ophthalmopathy: No blurry vision, double vision, eye pain, chemosis - We will recheck her TFTs today and change the methimazole  dose accordingly.  If still normal, plan to stop methimazole .  We did discuss that if you make any changes in her methimazole  regimen, she needs to return for labs in 1-1.5 months - I plan to see her back in a year, possibly sooner for labs   Needs MMI refills in case we continue this.  Orders Placed This Encounter  Procedures   TSH   T4, free   T3, free   Lela Fendt, MD PhD [facility] Endocr[facility]ology

## 2024-08-25 ENCOUNTER — Ambulatory Visit: Admitting: Dermatology

## 2024-08-28 ENCOUNTER — Ambulatory Visit: Admitting: Physician Assistant

## 2024-08-28 ENCOUNTER — Ambulatory Visit

## 2024-09-11 ENCOUNTER — Ambulatory Visit: Admitting: Family

## 2024-10-13 ENCOUNTER — Ambulatory Visit: Admitting: Family

## 2024-10-16 ENCOUNTER — Ambulatory Visit: Admitting: Internal Medicine

## 2025-06-11 ENCOUNTER — Ambulatory Visit

## 2025-07-30 ENCOUNTER — Ambulatory Visit: Admitting: Internal Medicine
